# Patient Record
Sex: Female | Born: 1945 | Race: Black or African American | Hispanic: No | Marital: Single | Smoking: Never smoker
Health system: Southern US, Community
[De-identification: ages and names within clinical notes are randomized; demographics above are authoritative.]

## PROBLEM LIST (undated history)

## (undated) DIAGNOSIS — I10 Essential (primary) hypertension: Secondary | ICD-10-CM

## (undated) DIAGNOSIS — E119 Type 2 diabetes mellitus without complications: Secondary | ICD-10-CM

## (undated) DIAGNOSIS — E872 Acidosis: Secondary | ICD-10-CM

## (undated) DIAGNOSIS — M1990 Unspecified osteoarthritis, unspecified site: Secondary | ICD-10-CM

## (undated) DIAGNOSIS — R51 Headache: Secondary | ICD-10-CM

## (undated) DIAGNOSIS — E785 Hyperlipidemia, unspecified: Secondary | ICD-10-CM

## (undated) DIAGNOSIS — K635 Polyp of colon: Secondary | ICD-10-CM

## (undated) DIAGNOSIS — K219 Gastro-esophageal reflux disease without esophagitis: Secondary | ICD-10-CM

## (undated) DIAGNOSIS — J449 Chronic obstructive pulmonary disease, unspecified: Secondary | ICD-10-CM

## (undated) DIAGNOSIS — N184 Chronic kidney disease, stage 4 (severe): Secondary | ICD-10-CM

## (undated) DIAGNOSIS — Z9981 Dependence on supplemental oxygen: Secondary | ICD-10-CM

## (undated) DIAGNOSIS — M5126 Other intervertebral disc displacement, lumbar region: Secondary | ICD-10-CM

## (undated) DIAGNOSIS — I5032 Chronic diastolic (congestive) heart failure: Secondary | ICD-10-CM

## (undated) DIAGNOSIS — D860 Sarcoidosis of lung: Secondary | ICD-10-CM

## (undated) DIAGNOSIS — F419 Anxiety disorder, unspecified: Secondary | ICD-10-CM

## (undated) DIAGNOSIS — G4733 Obstructive sleep apnea (adult) (pediatric): Secondary | ICD-10-CM

## (undated) DIAGNOSIS — M109 Gout, unspecified: Secondary | ICD-10-CM

## (undated) DIAGNOSIS — K589 Irritable bowel syndrome without diarrhea: Secondary | ICD-10-CM

## (undated) DIAGNOSIS — M48061 Spinal stenosis, lumbar region without neurogenic claudication: Secondary | ICD-10-CM

## (undated) DIAGNOSIS — K5790 Diverticulosis of intestine, part unspecified, without perforation or abscess without bleeding: Secondary | ICD-10-CM

## (undated) DIAGNOSIS — M5136 Other intervertebral disc degeneration, lumbar region: Secondary | ICD-10-CM

## (undated) DIAGNOSIS — I2510 Atherosclerotic heart disease of native coronary artery without angina pectoris: Secondary | ICD-10-CM

## (undated) DIAGNOSIS — J42 Unspecified chronic bronchitis: Secondary | ICD-10-CM

## (undated) DIAGNOSIS — M51369 Other intervertebral disc degeneration, lumbar region without mention of lumbar back pain or lower extremity pain: Secondary | ICD-10-CM

## (undated) DIAGNOSIS — I509 Heart failure, unspecified: Secondary | ICD-10-CM

## (undated) DIAGNOSIS — H269 Unspecified cataract: Secondary | ICD-10-CM

## (undated) DIAGNOSIS — J189 Pneumonia, unspecified organism: Secondary | ICD-10-CM

## (undated) HISTORY — DX: Sarcoidosis of lung: D86.0

## (undated) HISTORY — DX: Polyp of colon: K63.5

## (undated) HISTORY — DX: Obstructive sleep apnea (adult) (pediatric): G47.33

## (undated) HISTORY — DX: Anxiety disorder, unspecified: F41.9

## (undated) HISTORY — DX: Gastro-esophageal reflux disease without esophagitis: K21.9

## (undated) HISTORY — PX: JOINT REPLACEMENT: SHX530

## (undated) HISTORY — DX: Diverticulosis of intestine, part unspecified, without perforation or abscess without bleeding: K57.90

## (undated) HISTORY — PX: OTHER SURGICAL HISTORY: SHX169

## (undated) HISTORY — PX: COLONOSCOPY W/ BIOPSIES AND POLYPECTOMY: SHX1376

---
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
## 1979-04-03 HISTORY — PX: REDUCTION MAMMAPLASTY: SUR839

## 1979-04-03 HISTORY — PX: CHOLECYSTECTOMY: SHX55

## 1989-04-02 HISTORY — PX: CARDIAC CATHETERIZATION: SHX172

## 1990-08-02 HISTORY — PX: ABDOMINAL HYSTERECTOMY: SHX81

## 1990-08-02 HISTORY — PX: OTHER SURGICAL HISTORY: SHX169

## 1998-11-13 ENCOUNTER — Other Ambulatory Visit: Admission: RE | Admit: 1998-11-13 | Discharge: 1998-11-13 | Payer: Self-pay | Admitting: Gynecology

## 1999-05-25 ENCOUNTER — Encounter: Admission: RE | Admit: 1999-05-25 | Discharge: 1999-08-23 | Payer: Self-pay | Admitting: Pulmonary Disease

## 1999-06-17 ENCOUNTER — Encounter: Payer: Self-pay | Admitting: Internal Medicine

## 1999-08-26 ENCOUNTER — Encounter: Admission: RE | Admit: 1999-08-26 | Discharge: 1999-10-13 | Payer: Self-pay | Admitting: *Deleted

## 1999-10-05 ENCOUNTER — Encounter: Admission: RE | Admit: 1999-10-05 | Discharge: 2000-01-03 | Payer: Self-pay | Admitting: Pulmonary Disease

## 1999-10-29 ENCOUNTER — Encounter: Payer: Self-pay | Admitting: Occupational Medicine

## 1999-10-29 ENCOUNTER — Encounter: Admission: RE | Admit: 1999-10-29 | Discharge: 1999-10-29 | Payer: Self-pay | Admitting: Occupational Medicine

## 2000-01-07 ENCOUNTER — Other Ambulatory Visit: Admission: RE | Admit: 2000-01-07 | Discharge: 2000-01-07 | Payer: Self-pay | Admitting: Gynecology

## 2002-12-18 ENCOUNTER — Encounter: Payer: Self-pay | Admitting: Orthopedic Surgery

## 2002-12-24 ENCOUNTER — Inpatient Hospital Stay (HOSPITAL_COMMUNITY): Admission: RE | Admit: 2002-12-24 | Discharge: 2002-12-27 | Payer: Self-pay | Admitting: Orthopedic Surgery

## 2002-12-27 ENCOUNTER — Inpatient Hospital Stay (HOSPITAL_COMMUNITY)
Admission: RE | Admit: 2002-12-27 | Discharge: 2003-01-04 | Payer: Self-pay | Admitting: Physical Medicine & Rehabilitation

## 2003-01-04 ENCOUNTER — Encounter (INDEPENDENT_AMBULATORY_CARE_PROVIDER_SITE_OTHER): Payer: Self-pay | Admitting: *Deleted

## 2003-01-22 ENCOUNTER — Ambulatory Visit (HOSPITAL_COMMUNITY): Admission: RE | Admit: 2003-01-22 | Discharge: 2003-01-22 | Payer: Self-pay | Admitting: Internal Medicine

## 2003-01-23 ENCOUNTER — Emergency Department (HOSPITAL_COMMUNITY): Admission: AD | Admit: 2003-01-23 | Discharge: 2003-01-23 | Payer: Self-pay

## 2003-10-09 ENCOUNTER — Emergency Department (HOSPITAL_COMMUNITY): Admission: EM | Admit: 2003-10-09 | Discharge: 2003-10-09 | Payer: Self-pay | Admitting: Family Medicine

## 2004-05-12 ENCOUNTER — Emergency Department (HOSPITAL_COMMUNITY): Admission: EM | Admit: 2004-05-12 | Discharge: 2004-05-12 | Payer: Self-pay | Admitting: Family Medicine

## 2004-06-09 ENCOUNTER — Encounter: Admission: RE | Admit: 2004-06-09 | Discharge: 2004-07-21 | Payer: Self-pay | Admitting: Pulmonary Disease

## 2004-07-21 ENCOUNTER — Ambulatory Visit: Payer: Self-pay | Admitting: Pulmonary Disease

## 2004-09-21 ENCOUNTER — Ambulatory Visit: Payer: Self-pay | Admitting: Pulmonary Disease

## 2004-10-13 ENCOUNTER — Ambulatory Visit: Payer: Self-pay | Admitting: Pulmonary Disease

## 2005-02-15 ENCOUNTER — Ambulatory Visit: Payer: Self-pay | Admitting: Pulmonary Disease

## 2005-04-19 ENCOUNTER — Ambulatory Visit: Payer: Self-pay | Admitting: Pulmonary Disease

## 2005-05-10 ENCOUNTER — Ambulatory Visit: Payer: Self-pay | Admitting: Pulmonary Disease

## 2005-10-07 ENCOUNTER — Ambulatory Visit: Payer: Self-pay | Admitting: Internal Medicine

## 2005-10-18 ENCOUNTER — Ambulatory Visit: Payer: Self-pay | Admitting: Pulmonary Disease

## 2005-11-01 ENCOUNTER — Ambulatory Visit: Payer: Self-pay | Admitting: Pulmonary Disease

## 2005-11-16 ENCOUNTER — Encounter: Admission: RE | Admit: 2005-11-16 | Discharge: 2005-11-16 | Payer: Self-pay | Admitting: Pulmonary Disease

## 2005-12-13 ENCOUNTER — Ambulatory Visit: Payer: Self-pay | Admitting: Pulmonary Disease

## 2005-12-25 ENCOUNTER — Emergency Department (HOSPITAL_COMMUNITY): Admission: EM | Admit: 2005-12-25 | Discharge: 2005-12-25 | Payer: Self-pay | Admitting: Emergency Medicine

## 2005-12-30 ENCOUNTER — Ambulatory Visit: Payer: Self-pay | Admitting: Pulmonary Disease

## 2005-12-31 ENCOUNTER — Ambulatory Visit: Payer: Self-pay | Admitting: Cardiology

## 2006-01-04 ENCOUNTER — Inpatient Hospital Stay (HOSPITAL_BASED_OUTPATIENT_CLINIC_OR_DEPARTMENT_OTHER): Admission: RE | Admit: 2006-01-04 | Discharge: 2006-01-04 | Payer: Self-pay | Admitting: Cardiology

## 2006-01-04 ENCOUNTER — Ambulatory Visit: Payer: Self-pay | Admitting: Cardiology

## 2006-01-04 DIAGNOSIS — I2510 Atherosclerotic heart disease of native coronary artery without angina pectoris: Secondary | ICD-10-CM

## 2006-01-04 HISTORY — DX: Atherosclerotic heart disease of native coronary artery without angina pectoris: I25.10

## 2006-01-13 ENCOUNTER — Ambulatory Visit: Payer: Self-pay | Admitting: Cardiology

## 2006-01-19 ENCOUNTER — Ambulatory Visit: Payer: Self-pay | Admitting: *Deleted

## 2006-01-21 ENCOUNTER — Encounter (INDEPENDENT_AMBULATORY_CARE_PROVIDER_SITE_OTHER): Payer: Self-pay | Admitting: *Deleted

## 2006-01-21 ENCOUNTER — Ambulatory Visit: Payer: Self-pay | Admitting: Cardiovascular Disease

## 2006-01-26 ENCOUNTER — Ambulatory Visit: Payer: Self-pay

## 2006-01-26 ENCOUNTER — Encounter: Payer: Self-pay | Admitting: Internal Medicine

## 2006-02-04 ENCOUNTER — Encounter (INDEPENDENT_AMBULATORY_CARE_PROVIDER_SITE_OTHER): Payer: Self-pay | Admitting: *Deleted

## 2006-02-04 ENCOUNTER — Encounter: Admission: RE | Admit: 2006-02-04 | Discharge: 2006-02-04 | Payer: Self-pay | Admitting: Cardiology

## 2006-02-16 ENCOUNTER — Ambulatory Visit: Payer: Self-pay | Admitting: Pulmonary Disease

## 2006-02-22 ENCOUNTER — Ambulatory Visit: Payer: Self-pay | Admitting: Cardiology

## 2006-02-25 ENCOUNTER — Ambulatory Visit: Payer: Self-pay | Admitting: Pulmonary Disease

## 2006-02-28 ENCOUNTER — Encounter: Admission: RE | Admit: 2006-02-28 | Discharge: 2006-02-28 | Payer: Self-pay | Admitting: Pulmonary Disease

## 2006-03-31 ENCOUNTER — Encounter: Admission: RE | Admit: 2006-03-31 | Discharge: 2006-03-31 | Payer: Self-pay | Admitting: Pulmonary Disease

## 2006-04-12 ENCOUNTER — Ambulatory Visit: Payer: Self-pay | Admitting: Pulmonary Disease

## 2006-04-12 ENCOUNTER — Inpatient Hospital Stay (HOSPITAL_COMMUNITY): Admission: EM | Admit: 2006-04-12 | Discharge: 2006-04-15 | Payer: Self-pay | Admitting: Emergency Medicine

## 2006-04-13 ENCOUNTER — Encounter: Payer: Self-pay | Admitting: Cardiovascular Disease

## 2006-04-13 ENCOUNTER — Ambulatory Visit: Payer: Self-pay | Admitting: Cardiovascular Disease

## 2006-04-20 ENCOUNTER — Ambulatory Visit: Payer: Self-pay | Admitting: Pulmonary Disease

## 2006-05-10 ENCOUNTER — Encounter: Admission: RE | Admit: 2006-05-10 | Discharge: 2006-08-08 | Payer: Self-pay | Admitting: Pulmonary Disease

## 2006-05-18 ENCOUNTER — Ambulatory Visit: Payer: Self-pay | Admitting: Pulmonary Disease

## 2006-06-10 ENCOUNTER — Ambulatory Visit (HOSPITAL_BASED_OUTPATIENT_CLINIC_OR_DEPARTMENT_OTHER): Admission: RE | Admit: 2006-06-10 | Discharge: 2006-06-10 | Payer: Self-pay | Admitting: Pulmonary Disease

## 2006-06-25 ENCOUNTER — Ambulatory Visit: Payer: Self-pay | Admitting: Pulmonary Disease

## 2006-07-05 ENCOUNTER — Ambulatory Visit: Payer: Self-pay | Admitting: Pulmonary Disease

## 2006-07-20 ENCOUNTER — Ambulatory Visit: Payer: Self-pay | Admitting: Pulmonary Disease

## 2006-09-02 ENCOUNTER — Ambulatory Visit (HOSPITAL_BASED_OUTPATIENT_CLINIC_OR_DEPARTMENT_OTHER): Admission: RE | Admit: 2006-09-02 | Discharge: 2006-09-02 | Payer: Self-pay | Admitting: Pulmonary Disease

## 2006-09-02 ENCOUNTER — Ambulatory Visit: Payer: Self-pay | Admitting: Pulmonary Disease

## 2006-10-03 ENCOUNTER — Ambulatory Visit: Payer: Self-pay | Admitting: Pulmonary Disease

## 2006-10-03 LAB — CONVERTED CEMR LAB
Albumin: 3.8 g/dL (ref 3.5–5.2)
BUN: 32 mg/dL — ABNORMAL HIGH (ref 6–23)
CO2: 33 meq/L — ABNORMAL HIGH (ref 19–32)
Calcium: 9.6 mg/dL (ref 8.4–10.5)
Chloride: 103 meq/L (ref 96–112)
Creatinine, Ser: 1.3 mg/dL — ABNORMAL HIGH (ref 0.4–1.2)
GFR calc Af Amer: 54 mL/min
GFR calc non Af Amer: 45 mL/min
Glucose, Bld: 200 mg/dL — ABNORMAL HIGH (ref 70–99)
Hgb A1c MFr Bld: 8.7 % — ABNORMAL HIGH (ref 4.6–6.0)
Phosphorus: 3.7 mg/dL (ref 2.3–4.6)
Potassium: 4.5 meq/L (ref 3.5–5.1)
Sodium: 142 meq/L (ref 135–145)

## 2006-10-17 ENCOUNTER — Ambulatory Visit: Payer: Self-pay | Admitting: Pulmonary Disease

## 2006-11-21 ENCOUNTER — Ambulatory Visit: Payer: Self-pay | Admitting: Pulmonary Disease

## 2006-11-21 LAB — CONVERTED CEMR LAB
ALT: 18 units/L (ref 0–40)
AST: 17 units/L (ref 0–37)
Albumin: 4 g/dL (ref 3.5–5.2)
Alkaline Phosphatase: 90 units/L (ref 39–117)
BUN: 24 mg/dL — ABNORMAL HIGH (ref 6–23)
Basophils Absolute: 0 10*3/uL (ref 0.0–0.1)
Basophils Relative: 0.4 % (ref 0.0–1.0)
Bilirubin, Direct: 0.1 mg/dL (ref 0.0–0.3)
CO2: 26 meq/L (ref 19–32)
Calcium: 9.6 mg/dL (ref 8.4–10.5)
Chloride: 111 meq/L (ref 96–112)
Creatinine, Ser: 1 mg/dL (ref 0.4–1.2)
Creatinine,U: 86.9 mg/dL
Eosinophils Absolute: 0.2 10*3/uL (ref 0.0–0.6)
Eosinophils Relative: 4 % (ref 0.0–5.0)
GFR calc Af Amer: 73 mL/min
GFR calc non Af Amer: 61 mL/min
Glucose, Bld: 138 mg/dL — ABNORMAL HIGH (ref 70–99)
HCT: 36.7 % (ref 36.0–46.0)
Hemoglobin: 12.8 g/dL (ref 12.0–15.0)
Hgb A1c MFr Bld: 7.5 % — ABNORMAL HIGH (ref 4.6–6.0)
Iron: 85 ug/dL (ref 42–145)
Lymphocytes Relative: 32 % (ref 12.0–46.0)
MCHC: 34.9 g/dL (ref 30.0–36.0)
MCV: 86.9 fL (ref 78.0–100.0)
Microalb Creat Ratio: 468.4 mg/g — ABNORMAL HIGH (ref 0.0–30.0)
Microalb, Ur: 40.7 mg/dL (ref 0.0–1.9)
Monocytes Absolute: 0.4 10*3/uL (ref 0.2–0.7)
Monocytes Relative: 8.8 % (ref 3.0–11.0)
Neutro Abs: 2.7 10*3/uL (ref 1.4–7.7)
Neutrophils Relative %: 54.8 % (ref 43.0–77.0)
Platelets: 212 10*3/uL (ref 150–400)
Potassium: 4.4 meq/L (ref 3.5–5.1)
RBC: 4.23 M/uL (ref 3.87–5.11)
RDW: 12.6 % (ref 11.5–14.6)
Saturation Ratios: 26.5 % (ref 20.0–50.0)
Sodium: 145 meq/L (ref 135–145)
TSH: 3.31 microintl units/mL (ref 0.35–5.50)
Total Bilirubin: 0.7 mg/dL (ref 0.3–1.2)
Total Protein: 6.8 g/dL (ref 6.0–8.3)
Transferrin: 229.2 mg/dL (ref >=212.0)
WBC: 4.9 10*3/uL (ref 4.5–10.5)

## 2007-03-05 ENCOUNTER — Emergency Department (HOSPITAL_COMMUNITY): Admission: EM | Admit: 2007-03-05 | Discharge: 2007-03-05 | Payer: Self-pay | Admitting: Emergency Medicine

## 2007-05-31 DIAGNOSIS — K219 Gastro-esophageal reflux disease without esophagitis: Secondary | ICD-10-CM | POA: Insufficient documentation

## 2007-05-31 DIAGNOSIS — L409 Psoriasis, unspecified: Secondary | ICD-10-CM

## 2007-05-31 DIAGNOSIS — I10 Essential (primary) hypertension: Secondary | ICD-10-CM

## 2007-05-31 DIAGNOSIS — M1990 Unspecified osteoarthritis, unspecified site: Secondary | ICD-10-CM | POA: Insufficient documentation

## 2007-05-31 DIAGNOSIS — D649 Anemia, unspecified: Secondary | ICD-10-CM | POA: Insufficient documentation

## 2007-06-02 ENCOUNTER — Emergency Department (HOSPITAL_COMMUNITY): Admission: EM | Admit: 2007-06-02 | Discharge: 2007-06-02 | Payer: Self-pay | Admitting: Emergency Medicine

## 2007-08-14 ENCOUNTER — Ambulatory Visit: Payer: Self-pay | Admitting: Pulmonary Disease

## 2007-08-14 DIAGNOSIS — I679 Cerebrovascular disease, unspecified: Secondary | ICD-10-CM

## 2007-08-14 DIAGNOSIS — J209 Acute bronchitis, unspecified: Secondary | ICD-10-CM

## 2007-08-14 DIAGNOSIS — K5730 Diverticulosis of large intestine without perforation or abscess without bleeding: Secondary | ICD-10-CM | POA: Insufficient documentation

## 2007-08-14 DIAGNOSIS — G4733 Obstructive sleep apnea (adult) (pediatric): Secondary | ICD-10-CM

## 2007-08-14 DIAGNOSIS — I2510 Atherosclerotic heart disease of native coronary artery without angina pectoris: Secondary | ICD-10-CM | POA: Insufficient documentation

## 2007-08-15 LAB — CONVERTED CEMR LAB
BUN: 24 mg/dL — ABNORMAL HIGH (ref 6–23)
CO2: 33 meq/L — ABNORMAL HIGH (ref 19–32)
Calcium: 9.2 mg/dL (ref 8.4–10.5)
Chloride: 101 meq/L (ref 96–112)
Creatinine, Ser: 1.7 mg/dL — ABNORMAL HIGH (ref 0.4–1.2)
GFR calc Af Amer: 40 mL/min
GFR calc non Af Amer: 33 mL/min
Glucose, Bld: 289 mg/dL — ABNORMAL HIGH (ref 70–99)
Hgb A1c MFr Bld: 7.3 % — ABNORMAL HIGH (ref 4.6–6.0)
Potassium: 4.3 meq/L (ref 3.5–5.1)
Sodium: 141 meq/L (ref 135–145)

## 2007-09-01 ENCOUNTER — Inpatient Hospital Stay (HOSPITAL_COMMUNITY): Admission: RE | Admit: 2007-09-01 | Discharge: 2007-09-07 | Payer: Self-pay | Admitting: Orthopedic Surgery

## 2007-09-13 ENCOUNTER — Ambulatory Visit: Payer: Self-pay | Admitting: Pulmonary Disease

## 2007-09-13 DIAGNOSIS — M109 Gout, unspecified: Secondary | ICD-10-CM

## 2007-10-18 ENCOUNTER — Ambulatory Visit: Payer: Self-pay | Admitting: Pulmonary Disease

## 2007-10-31 ENCOUNTER — Telehealth (INDEPENDENT_AMBULATORY_CARE_PROVIDER_SITE_OTHER): Payer: Self-pay | Admitting: *Deleted

## 2007-11-20 ENCOUNTER — Ambulatory Visit: Payer: Self-pay | Admitting: Pulmonary Disease

## 2007-11-24 LAB — CONVERTED CEMR LAB
ALT: 26 units/L (ref 0–35)
AST: 22 units/L (ref 0–37)
Albumin: 3.8 g/dL (ref 3.5–5.2)
Alkaline Phosphatase: 116 units/L (ref 39–117)
BUN: 24 mg/dL — ABNORMAL HIGH (ref 6–23)
Basophils Absolute: 0 10*3/uL (ref 0.0–0.1)
Basophils Relative: 0 % (ref 0.0–1.0)
Bilirubin, Direct: 0.1 mg/dL (ref 0.0–0.3)
CO2: 27 meq/L (ref 19–32)
Calcium: 9.7 mg/dL (ref 8.4–10.5)
Chloride: 109 meq/L (ref 96–112)
Cholesterol: 154 mg/dL (ref 0–200)
Creatinine, Ser: 1.1 mg/dL (ref 0.4–1.2)
Eosinophils Absolute: 0.3 10*3/uL (ref 0.0–0.7)
Eosinophils Relative: 4.9 % (ref 0.0–5.0)
GFR calc Af Amer: 65 mL/min
GFR calc non Af Amer: 54 mL/min
Glucose, Bld: 148 mg/dL — ABNORMAL HIGH (ref 70–99)
HCT: 37.3 % (ref 36.0–46.0)
HDL: 34 mg/dL — ABNORMAL LOW (ref >39.0)
Hemoglobin: 13 g/dL (ref 12.0–15.0)
Hgb A1c MFr Bld: 7.3 % — ABNORMAL HIGH (ref 4.6–6.0)
LDL Cholesterol: 82 mg/dL (ref 0–99)
Lymphocytes Relative: 33.5 % (ref 12.0–46.0)
MCHC: 35 g/dL (ref 30.0–36.0)
MCV: 86.5 fL (ref 78.0–100.0)
Monocytes Absolute: 0.4 10*3/uL (ref 0.1–1.0)
Monocytes Relative: 7.1 % (ref 3.0–12.0)
Neutro Abs: 3.2 10*3/uL (ref 1.4–7.7)
Neutrophils Relative %: 54.5 % (ref 43.0–77.0)
Platelets: 223 10*3/uL (ref 150–400)
Potassium: 4.3 meq/L (ref 3.5–5.1)
RBC: 4.31 M/uL (ref 3.87–5.11)
RDW: 13.1 % (ref 11.5–14.6)
Sodium: 141 meq/L (ref 135–145)
TSH: 2.07 microintl units/mL (ref 0.35–5.50)
Total Bilirubin: 0.9 mg/dL (ref 0.3–1.2)
Total CHOL/HDL Ratio: 4.5
Total Protein: 6.9 g/dL (ref 6.0–8.3)
Triglycerides: 192 mg/dL — ABNORMAL HIGH (ref 0–149)
Uric Acid, Serum: 6.8 mg/dL (ref 2.4–7.0)
VLDL: 38 mg/dL (ref 0–40)
WBC: 5.8 10*3/uL (ref 4.5–10.5)

## 2008-01-29 ENCOUNTER — Ambulatory Visit: Payer: Self-pay | Admitting: Pulmonary Disease

## 2008-01-29 DIAGNOSIS — E669 Obesity, unspecified: Secondary | ICD-10-CM

## 2008-02-19 ENCOUNTER — Encounter: Payer: Self-pay | Admitting: Pulmonary Disease

## 2008-02-21 ENCOUNTER — Encounter: Payer: Self-pay | Admitting: Pulmonary Disease

## 2008-06-17 ENCOUNTER — Emergency Department (HOSPITAL_COMMUNITY): Admission: EM | Admit: 2008-06-17 | Discharge: 2008-06-17 | Payer: Self-pay | Admitting: Family Medicine

## 2008-08-25 ENCOUNTER — Emergency Department (HOSPITAL_COMMUNITY): Admission: EM | Admit: 2008-08-25 | Discharge: 2008-08-25 | Payer: Self-pay | Admitting: Family Medicine

## 2008-09-17 ENCOUNTER — Ambulatory Visit: Payer: Self-pay | Admitting: Pulmonary Disease

## 2008-09-22 DIAGNOSIS — F411 Generalized anxiety disorder: Secondary | ICD-10-CM | POA: Insufficient documentation

## 2008-09-22 LAB — CONVERTED CEMR LAB
ALT: 25 units/L (ref 0–35)
AST: 19 units/L (ref 0–37)
Albumin: 3.4 g/dL — ABNORMAL LOW (ref 3.5–5.2)
Alkaline Phosphatase: 194 units/L — ABNORMAL HIGH (ref 39–117)
BUN: 23 mg/dL (ref 6–23)
Basophils Absolute: 0 10*3/uL (ref 0.0–0.1)
Basophils Relative: 0.6 % (ref 0.0–3.0)
Bilirubin, Direct: 0.2 mg/dL (ref 0.0–0.3)
CO2: 25 meq/L (ref 19–32)
Calcium: 9.5 mg/dL (ref 8.4–10.5)
Chloride: 107 meq/L (ref 96–112)
Cholesterol: 159 mg/dL (ref 0–200)
Creatinine, Ser: 1.5 mg/dL — ABNORMAL HIGH (ref 0.4–1.2)
Direct LDL: 82.9 mg/dL
Eosinophils Absolute: 0.2 10*3/uL (ref 0.0–0.7)
Eosinophils Relative: 4.4 % (ref 0.0–5.0)
GFR calc Af Amer: 46 mL/min
GFR calc non Af Amer: 38 mL/min
Glucose, Bld: 164 mg/dL — ABNORMAL HIGH (ref 70–99)
HCT: 35.7 % — ABNORMAL LOW (ref 36.0–46.0)
HDL: 29.2 mg/dL — ABNORMAL LOW (ref >39.0)
Hemoglobin: 12.5 g/dL (ref 12.0–15.0)
Hgb A1c MFr Bld: 8.6 % — ABNORMAL HIGH (ref 4.6–6.0)
Lymphocytes Relative: 23 % (ref 12.0–46.0)
MCHC: 35.1 g/dL (ref 30.0–36.0)
MCV: 84.9 fL (ref 78.0–100.0)
Monocytes Absolute: 0.5 10*3/uL (ref 0.1–1.0)
Monocytes Relative: 10.9 % (ref 3.0–12.0)
Neutro Abs: 2.8 10*3/uL (ref 1.4–7.7)
Neutrophils Relative %: 61.1 % (ref 43.0–77.0)
Platelets: 246 10*3/uL (ref 150–400)
Potassium: 4.5 meq/L (ref 3.5–5.1)
RBC: 4.21 M/uL (ref 3.87–5.11)
RDW: 12.7 % (ref 11.5–14.6)
Sodium: 141 meq/L (ref 135–145)
TSH: 2.25 microintl units/mL (ref 0.35–5.50)
Total Bilirubin: 0.9 mg/dL (ref 0.3–1.2)
Total CHOL/HDL Ratio: 5.4
Total Protein: 6.6 g/dL (ref 6.0–8.3)
Triglycerides: 207 mg/dL (ref 0–149)
Uric Acid, Serum: 7.5 mg/dL — ABNORMAL HIGH (ref 2.4–7.0)
VLDL: 41 mg/dL — ABNORMAL HIGH (ref 0–40)
Vit D, 25-Hydroxy: 5 ng/mL — ABNORMAL LOW (ref 30–89)
WBC: 4.5 10*3/uL (ref 4.5–10.5)

## 2008-10-02 ENCOUNTER — Telehealth (INDEPENDENT_AMBULATORY_CARE_PROVIDER_SITE_OTHER): Payer: Self-pay | Admitting: *Deleted

## 2008-10-28 ENCOUNTER — Telehealth: Payer: Self-pay | Admitting: Pulmonary Disease

## 2008-10-30 ENCOUNTER — Ambulatory Visit: Payer: Self-pay | Admitting: Pulmonary Disease

## 2008-12-09 ENCOUNTER — Telehealth (INDEPENDENT_AMBULATORY_CARE_PROVIDER_SITE_OTHER): Payer: Self-pay | Admitting: *Deleted

## 2009-05-26 ENCOUNTER — Encounter: Payer: Self-pay | Admitting: Pulmonary Disease

## 2009-08-02 DIAGNOSIS — K635 Polyp of colon: Secondary | ICD-10-CM

## 2009-08-02 HISTORY — DX: Polyp of colon: K63.5

## 2009-09-26 ENCOUNTER — Encounter (INDEPENDENT_AMBULATORY_CARE_PROVIDER_SITE_OTHER): Payer: Self-pay | Admitting: *Deleted

## 2009-09-26 ENCOUNTER — Ambulatory Visit: Payer: Self-pay | Admitting: Pulmonary Disease

## 2009-09-26 LAB — CONVERTED CEMR LAB
ALT: 14 U/L
AST: 13 U/L
Albumin: 3.8 g/dL
Alkaline Phosphatase: 102 U/L
BUN: 31 mg/dL — ABNORMAL HIGH
Basophils Absolute: 0 K/uL
Basophils Relative: 0.7 %
Bilirubin, Direct: 0.1 mg/dL
CO2: 30 meq/L
Calcium: 9.8 mg/dL
Chloride: 103 meq/L
Cholesterol: 185 mg/dL
Creatinine, Ser: 1.7 mg/dL — ABNORMAL HIGH
Direct LDL: 98.9 mg/dL
Eosinophils Absolute: 0.2 K/uL
Eosinophils Relative: 3.6 %
GFR calc non Af Amer: 39.22 mL/min
Glucose, Bld: 298 mg/dL — ABNORMAL HIGH
HCT: 38.5 %
HDL: 42.6 mg/dL
Hemoglobin: 12.8 g/dL
Hgb A1c MFr Bld: 8.4 % — ABNORMAL HIGH
Lymphocytes Relative: 20.7 %
Lymphs Abs: 0.9 K/uL
MCHC: 33.2 g/dL
MCV: 87.6 fL
Monocytes Absolute: 0.4 K/uL
Monocytes Relative: 8.4 %
Neutro Abs: 2.9 K/uL
Neutrophils Relative %: 66.6 %
Platelets: 233 K/uL
Potassium: 3.9 meq/L
RBC: 4.4 M/uL
RDW: 12.6 %
Sodium: 138 meq/L
TSH: 2.1 u[IU]/mL
Total Bilirubin: 0.8 mg/dL
Total CHOL/HDL Ratio: 4
Total Protein: 7.2 g/dL
Triglycerides: 242 mg/dL — ABNORMAL HIGH
VLDL: 48.4 mg/dL — ABNORMAL HIGH
WBC: 4.4 10*3/microliter — ABNORMAL LOW

## 2009-09-30 ENCOUNTER — Telehealth: Payer: Self-pay | Admitting: Pulmonary Disease

## 2009-10-27 LAB — CONVERTED CEMR LAB: Vit D, 25-Hydroxy: 37 ng/mL (ref 30–89)

## 2009-12-15 ENCOUNTER — Ambulatory Visit: Payer: Self-pay | Admitting: Internal Medicine

## 2009-12-15 ENCOUNTER — Encounter (INDEPENDENT_AMBULATORY_CARE_PROVIDER_SITE_OTHER): Payer: Self-pay | Admitting: *Deleted

## 2009-12-15 DIAGNOSIS — R1084 Generalized abdominal pain: Secondary | ICD-10-CM | POA: Insufficient documentation

## 2009-12-15 DIAGNOSIS — R1319 Other dysphagia: Secondary | ICD-10-CM

## 2009-12-15 DIAGNOSIS — R112 Nausea with vomiting, unspecified: Secondary | ICD-10-CM

## 2009-12-15 DIAGNOSIS — R198 Other specified symptoms and signs involving the digestive system and abdomen: Secondary | ICD-10-CM

## 2010-01-05 ENCOUNTER — Ambulatory Visit: Payer: Self-pay | Admitting: Internal Medicine

## 2010-01-07 ENCOUNTER — Encounter: Payer: Self-pay | Admitting: Internal Medicine

## 2010-03-12 ENCOUNTER — Ambulatory Visit: Payer: Self-pay | Admitting: Pulmonary Disease

## 2010-03-12 DIAGNOSIS — N259 Disorder resulting from impaired renal tubular function, unspecified: Secondary | ICD-10-CM | POA: Insufficient documentation

## 2010-03-12 DIAGNOSIS — Q2733 Arteriovenous malformation of digestive system vessel: Secondary | ICD-10-CM

## 2010-03-12 DIAGNOSIS — D126 Benign neoplasm of colon, unspecified: Secondary | ICD-10-CM

## 2010-03-12 LAB — CONVERTED CEMR LAB
BUN: 40 mg/dL — ABNORMAL HIGH (ref 6–23)
CO2: 30 meq/L (ref 19–32)
Calcium: 10.6 mg/dL — ABNORMAL HIGH (ref 8.4–10.5)
Chloride: 102 meq/L (ref 96–112)
Cholesterol: 167 mg/dL (ref 0–200)
Creatinine, Ser: 2 mg/dL — ABNORMAL HIGH (ref 0.4–1.2)
Direct LDL: 96.2 mg/dL
GFR calc non Af Amer: 32.85 mL/min (ref >60)
Glucose, Bld: 202 mg/dL — ABNORMAL HIGH (ref 70–99)
HDL: 37.2 mg/dL — ABNORMAL LOW (ref >39.00)
Hgb A1c MFr Bld: 10.9 % — ABNORMAL HIGH (ref 4.6–6.5)
Potassium: 4.7 meq/L (ref 3.5–5.1)
Sodium: 142 meq/L (ref 135–145)
Total CHOL/HDL Ratio: 4
Triglycerides: 216 mg/dL — ABNORMAL HIGH (ref 0.0–149.0)
Uric Acid, Serum: 7.7 mg/dL — ABNORMAL HIGH (ref 2.4–7.0)
VLDL: 43.2 mg/dL — ABNORMAL HIGH (ref 0.0–40.0)

## 2010-03-17 ENCOUNTER — Telehealth (INDEPENDENT_AMBULATORY_CARE_PROVIDER_SITE_OTHER): Payer: Self-pay | Admitting: *Deleted

## 2010-03-19 ENCOUNTER — Emergency Department (HOSPITAL_COMMUNITY): Admission: EM | Admit: 2010-03-19 | Discharge: 2010-03-19 | Payer: Self-pay | Admitting: Emergency Medicine

## 2010-03-23 ENCOUNTER — Telehealth: Payer: Self-pay | Admitting: Pulmonary Disease

## 2010-04-03 ENCOUNTER — Ambulatory Visit: Payer: Self-pay | Admitting: Pulmonary Disease

## 2010-04-07 ENCOUNTER — Ambulatory Visit (HOSPITAL_COMMUNITY): Admission: RE | Admit: 2010-04-07 | Discharge: 2010-04-07 | Payer: Self-pay | Admitting: Pulmonary Disease

## 2010-04-07 ENCOUNTER — Encounter: Payer: Self-pay | Admitting: Pulmonary Disease

## 2010-04-07 LAB — CONVERTED CEMR LAB
Albumin, U: DETECTED %
Alpha 1, Urine: DETECTED % — AB
Alpha 2, Urine: DETECTED % — AB
Beta, Urine: DETECTED % — AB
Free Kappa/Lambda Ratio: 19.24 — ABNORMAL HIGH (ref 0.46–4.00)
Gamma Globulin, Urine: DETECTED % — AB
Volume, Urine: 1500 mL

## 2010-04-10 ENCOUNTER — Encounter: Payer: Self-pay | Admitting: Pulmonary Disease

## 2010-04-13 ENCOUNTER — Ambulatory Visit: Payer: Self-pay

## 2010-04-13 ENCOUNTER — Encounter: Payer: Self-pay | Admitting: Pulmonary Disease

## 2010-04-14 ENCOUNTER — Ambulatory Visit: Payer: Self-pay | Admitting: Oncology

## 2010-04-22 ENCOUNTER — Encounter: Payer: Self-pay | Admitting: Pulmonary Disease

## 2010-04-22 LAB — CBC & DIFF AND RETIC
BASO%: 0.5 % (ref 0.0–2.0)
HCT: 33.8 % — ABNORMAL LOW (ref 34.8–46.6)
Immature Retic Fract: 2.8 % (ref 0.00–10.70)
MCHC: 34 g/dL (ref 31.5–36.0)
MONO#: 0.6 10*3/uL (ref 0.1–0.9)
RBC: 3.98 10*6/uL (ref 3.70–5.45)
RDW: 12.7 % (ref 11.2–14.5)
Retic %: 1.73 % — ABNORMAL HIGH (ref 0.50–1.50)
Retic Ct Abs: 68.85 10*3/uL (ref 18.30–72.70)
WBC: 4.1 10*3/uL (ref 3.9–10.3)
lymph#: 1 10*3/uL (ref 0.9–3.3)

## 2010-04-22 LAB — MORPHOLOGY
PLT EST: ADEQUATE
RBC Comments: NORMAL

## 2010-04-22 LAB — CHCC SMEAR

## 2010-04-23 LAB — CONVERTED CEMR LAB
Alpha-2-Globulin: 11.8 % (ref 7.1–11.8)
Beta Globulin: 5.8 % (ref 4.7–7.2)
IgA: 126 mg/dL (ref 68–378)
Total Protein, Serum Electrophoresis: 6.5 g/dL (ref 6.0–8.3)

## 2010-04-23 LAB — ERYTHROPOIETIN: Erythropoietin: 14.1 m[IU]/mL (ref 2.6–34.0)

## 2010-04-23 LAB — KAPPA/LAMBDA LIGHT CHAINS: Kappa:Lambda Ratio: 1.31 (ref 0.26–1.65)

## 2010-04-28 ENCOUNTER — Ambulatory Visit: Payer: Self-pay | Admitting: Pulmonary Disease

## 2010-04-28 LAB — CONVERTED CEMR LAB
BUN: 40 mg/dL — ABNORMAL HIGH (ref 6–23)
CO2: 28 meq/L (ref 19–32)
Calcium: 10.1 mg/dL (ref 8.4–10.5)
Creatinine, Ser: 2.2 mg/dL — ABNORMAL HIGH (ref 0.4–1.2)
Glucose, Bld: 149 mg/dL — ABNORMAL HIGH (ref 70–99)

## 2010-05-02 ENCOUNTER — Encounter: Admission: RE | Admit: 2010-05-02 | Discharge: 2010-05-02 | Payer: Self-pay | Admitting: Pulmonary Disease

## 2010-05-04 ENCOUNTER — Telehealth: Payer: Self-pay | Admitting: Pulmonary Disease

## 2010-05-06 ENCOUNTER — Encounter: Payer: Self-pay | Admitting: Pulmonary Disease

## 2010-05-11 ENCOUNTER — Encounter: Payer: Self-pay | Admitting: Pulmonary Disease

## 2010-06-08 ENCOUNTER — Encounter: Payer: Self-pay | Admitting: Pulmonary Disease

## 2010-06-15 ENCOUNTER — Encounter: Payer: Self-pay | Admitting: Pulmonary Disease

## 2010-06-17 ENCOUNTER — Encounter: Payer: Self-pay | Admitting: Pulmonary Disease

## 2010-06-17 ENCOUNTER — Inpatient Hospital Stay (HOSPITAL_COMMUNITY): Admission: AD | Admit: 2010-06-17 | Discharge: 2010-06-23 | Payer: Self-pay | Admitting: Internal Medicine

## 2010-06-17 ENCOUNTER — Ambulatory Visit: Payer: Self-pay | Admitting: Cardiovascular Disease

## 2010-06-17 ENCOUNTER — Encounter (INDEPENDENT_AMBULATORY_CARE_PROVIDER_SITE_OTHER): Payer: Self-pay | Admitting: *Deleted

## 2010-06-18 ENCOUNTER — Ambulatory Visit: Payer: Self-pay | Admitting: Oncology

## 2010-06-19 ENCOUNTER — Encounter (INDEPENDENT_AMBULATORY_CARE_PROVIDER_SITE_OTHER): Payer: Self-pay | Admitting: Internal Medicine

## 2010-06-22 ENCOUNTER — Encounter: Payer: Self-pay | Admitting: Pulmonary Disease

## 2010-06-22 ENCOUNTER — Encounter (INDEPENDENT_AMBULATORY_CARE_PROVIDER_SITE_OTHER): Payer: Self-pay | Admitting: Internal Medicine

## 2010-07-15 ENCOUNTER — Ambulatory Visit: Payer: Self-pay | Admitting: Pulmonary Disease

## 2010-07-15 ENCOUNTER — Telehealth: Payer: Self-pay | Admitting: Internal Medicine

## 2010-07-15 ENCOUNTER — Encounter: Payer: Self-pay | Admitting: Pulmonary Disease

## 2010-07-15 DIAGNOSIS — D7389 Other diseases of spleen: Secondary | ICD-10-CM | POA: Insufficient documentation

## 2010-07-16 DIAGNOSIS — D472 Monoclonal gammopathy: Secondary | ICD-10-CM

## 2010-07-17 ENCOUNTER — Ambulatory Visit: Payer: Self-pay | Admitting: Oncology

## 2010-07-17 ENCOUNTER — Telehealth (INDEPENDENT_AMBULATORY_CARE_PROVIDER_SITE_OTHER): Payer: Self-pay | Admitting: *Deleted

## 2010-07-17 ENCOUNTER — Encounter: Payer: Self-pay | Admitting: Pulmonary Disease

## 2010-07-17 LAB — COMPREHENSIVE METABOLIC PANEL
BUN: 33 mg/dL — ABNORMAL HIGH (ref 6–23)
CO2: 27 mEq/L (ref 19–32)
Calcium: 12.4 mg/dL — ABNORMAL HIGH (ref 8.4–10.5)
Chloride: 98 mEq/L (ref 96–112)
Creatinine, Ser: 2.87 mg/dL — ABNORMAL HIGH (ref 0.40–1.20)

## 2010-07-17 LAB — CBC WITH DIFFERENTIAL/PLATELET
Basophils Absolute: 0 10*3/uL (ref 0.0–0.1)
HCT: 31.5 % — ABNORMAL LOW (ref 34.8–46.6)
HGB: 10.5 g/dL — ABNORMAL LOW (ref 11.6–15.9)
MCH: 28.3 pg (ref 25.1–34.0)
MONO#: 0.5 10*3/uL (ref 0.1–0.9)
NEUT%: 68.7 % (ref 38.4–76.8)
WBC: 4.6 10*3/uL (ref 3.9–10.3)
lymph#: 0.8 10*3/uL — ABNORMAL LOW (ref 0.9–3.3)

## 2010-07-17 LAB — CONVERTED CEMR LAB
AST: 13 units/L (ref 0–37)
Albumin: 3.3 g/dL — ABNORMAL LOW (ref 3.5–5.2)
Alkaline Phosphatase: 84 units/L (ref 39–117)
BUN: 32 mg/dL — ABNORMAL HIGH (ref 6–23)
Basophils Absolute: 0 10*3/uL (ref 0.0–0.1)
CO2: 32 meq/L (ref 19–32)
Calcium: 12.6 mg/dL — ABNORMAL HIGH (ref 8.4–10.5)
Glucose, Bld: 368 mg/dL — ABNORMAL HIGH (ref 70–99)
Lymphocytes Relative: 16.4 % (ref 12.0–46.0)
Monocytes Relative: 12.8 % — ABNORMAL HIGH (ref 3.0–12.0)
Neutrophils Relative %: 66.1 % (ref 43.0–77.0)
Platelets: 189 10*3/uL (ref 150.0–400.0)
Potassium: 4.2 meq/L (ref 3.5–5.1)
RDW: 15.1 % — ABNORMAL HIGH (ref 11.5–14.6)
Sodium: 133 meq/L — ABNORMAL LOW (ref 135–145)
Total Protein: 6.3 g/dL (ref 6.0–8.3)
WBC: 5 10*3/uL (ref 4.5–10.5)

## 2010-07-17 LAB — LACTATE DEHYDROGENASE: LDH: 128 U/L (ref 94–250)

## 2010-07-20 ENCOUNTER — Encounter: Payer: Self-pay | Admitting: Pulmonary Disease

## 2010-07-24 ENCOUNTER — Ambulatory Visit: Payer: Self-pay | Admitting: Pulmonary Disease

## 2010-07-24 ENCOUNTER — Telehealth: Payer: Self-pay | Admitting: Pulmonary Disease

## 2010-07-29 DIAGNOSIS — K859 Acute pancreatitis without necrosis or infection, unspecified: Secondary | ICD-10-CM | POA: Insufficient documentation

## 2010-07-29 DIAGNOSIS — G4730 Sleep apnea, unspecified: Secondary | ICD-10-CM | POA: Insufficient documentation

## 2010-07-29 DIAGNOSIS — R011 Cardiac murmur, unspecified: Secondary | ICD-10-CM | POA: Insufficient documentation

## 2010-07-29 DIAGNOSIS — E7800 Pure hypercholesterolemia, unspecified: Secondary | ICD-10-CM

## 2010-07-29 DIAGNOSIS — J40 Bronchitis, not specified as acute or chronic: Secondary | ICD-10-CM | POA: Insufficient documentation

## 2010-07-29 DIAGNOSIS — K2990 Gastroduodenitis, unspecified, without bleeding: Secondary | ICD-10-CM

## 2010-07-29 DIAGNOSIS — J45909 Unspecified asthma, uncomplicated: Secondary | ICD-10-CM | POA: Insufficient documentation

## 2010-07-29 DIAGNOSIS — K2970 Gastritis, unspecified, without bleeding: Secondary | ICD-10-CM | POA: Insufficient documentation

## 2010-07-30 ENCOUNTER — Ambulatory Visit: Payer: Self-pay | Admitting: Cardiology

## 2010-07-30 ENCOUNTER — Ambulatory Visit (HOSPITAL_COMMUNITY): Admission: RE | Admit: 2010-07-30 | Payer: Self-pay | Source: Home / Self Care | Admitting: Pulmonary Disease

## 2010-07-31 ENCOUNTER — Ambulatory Visit (HOSPITAL_COMMUNITY)
Admission: RE | Admit: 2010-07-31 | Discharge: 2010-07-31 | Payer: Self-pay | Source: Home / Self Care | Attending: General Surgery | Admitting: General Surgery

## 2010-08-02 DIAGNOSIS — J189 Pneumonia, unspecified organism: Secondary | ICD-10-CM

## 2010-08-02 HISTORY — DX: Pneumonia, unspecified organism: J18.9

## 2010-08-02 HISTORY — PX: THORACENTESIS: SHX235

## 2010-08-06 ENCOUNTER — Telehealth: Payer: Self-pay | Admitting: Pulmonary Disease

## 2010-08-06 ENCOUNTER — Telehealth (INDEPENDENT_AMBULATORY_CARE_PROVIDER_SITE_OTHER): Payer: Self-pay | Admitting: *Deleted

## 2010-08-07 ENCOUNTER — Ambulatory Visit: Admit: 2010-08-07 | Payer: Self-pay | Admitting: Internal Medicine

## 2010-08-13 ENCOUNTER — Ambulatory Visit (HOSPITAL_COMMUNITY)
Admission: RE | Admit: 2010-08-13 | Discharge: 2010-08-13 | Payer: Self-pay | Source: Home / Self Care | Attending: Pulmonary Disease | Admitting: Pulmonary Disease

## 2010-08-17 LAB — GLUCOSE, CAPILLARY: Glucose-Capillary: 90 mg/dL (ref 70–99)

## 2010-08-18 ENCOUNTER — Ambulatory Visit: Admit: 2010-08-18 | Payer: Self-pay | Admitting: Pulmonary Disease

## 2010-08-19 ENCOUNTER — Telehealth: Payer: Self-pay | Admitting: Pulmonary Disease

## 2010-08-23 ENCOUNTER — Encounter: Payer: Self-pay | Admitting: Pulmonary Disease

## 2010-09-01 NOTE — Letter (Signed)
Summary: Diabetic Instructions  [facility]  [address] [place], [place] [address]   Phone: [phone]  Fax: [phone]    [name] [birth year] MRN: [id number]   X   ORAL DIABETIC MEDICATION INSTRUCTIONS  The day before your procedure:   Take your diabetic pill as you do normally  The day of your procedure:   Do not take your diabetic pill    We will check your blood sugar levels during the admission process and again in Recovery before discharging you home  ________________________________________________________________________  X   INSULIN (LONG ACTING) MEDICATION INSTRUCTIONS (Lantus, NPH, 70/30, Humulin, Novolin-N)   The day before your procedure:   Take  your regular evening dose    The day of your procedure:   Do not take your morning dose

## 2010-09-01 NOTE — Letter (Signed)
Summary: [facility] Instructions  [facility]  [address] [place], [place] [address]   Phone: [phone]  Fax: [phone]       [name]    [birth year]    MRN: [id number]        Procedure Day /Date:01/05/10  MON     Arrival Time:130 pm     Procedure Time:230 pm     Location of Procedure:                    X  [facility] (4th Floor)   PREPARATION FOR COLONOSCOPY WITH MOVIPREP   Starting 5 days prior to your procedure 12/31/09 do not eat nuts, seeds, popcorn, corn, beans, peas,  salads, or any raw vegetables.  Do not take any fiber supplements (e.g. Metamucil, Citrucel, and Benefiber).  THE DAY BEFORE YOUR PROCEDURE         DATE: 01/04/10  DAY: SUN  1.  Drink clear liquids the entire day-NO SOLID FOOD  2.  Do not drink anything colored red or purple.  Avoid juices with pulp.  No orange juice.  3.  Drink at least 64 oz. (8 glasses) of fluid/clear liquids during the day to prevent dehydration and help the prep work efficiently.  CLEAR LIQUIDS INCLUDE: Water Jello Ice Popsicles Tea (sugar ok, no milk/cream) Powdered fruit flavored drinks Coffee (sugar ok, no milk/cream) Gatorade Juice: apple, white grape, white cranberry  Lemonade Clear bullion, consomm, broth Carbonated beverages (any kind) Strained chicken noodle soup Hard Candy                             4.  In the morning, mix first dose of MoviPrep solution:    Empty 1 Pouch A and 1 Pouch B into the disposable container    Add lukewarm drinking water to the top line of the container. Mix to dissolve    Refrigerate (mixed solution should be used within 24 hrs)  5.  Begin drinking the prep at 5:00 p.m. The MoviPrep container is divided by 4 marks.   Every 15 minutes drink the solution down to the next mark (approximately 8 oz) until the full liter is complete.   6.  Follow completed prep with 16 oz of clear liquid of your choice (Nothing red or purple).  Continue to drink  clear liquids until bedtime.  7.  Before going to bed, mix second dose of MoviPrep solution:    Empty 1 Pouch A and 1 Pouch B into the disposable container    Add lukewarm drinking water to the top line of the container. Mix to dissolve    Refrigerate  THE DAY OF YOUR PROCEDURE      DATE: 01/05/10 DAY: MON  Beginning at 930 a.m. (5 hours before procedure):         1. Every 15 minutes, drink the solution down to the next mark (approx 8 oz) until the full liter is complete.  2. Follow completed prep with 16 oz. of clear liquid of your choice.    3. You may drink clear liquids until 1230 pm (2 HOURS BEFORE PROCEDURE).   MEDICATION INSTRUCTIONS  Unless otherwise instructed, you should take regular prescription medications with a small sip of water   as early as possible the morning of your procedure.  Diabetic patients - see separate instructions.           OTHER INSTRUCTIONS  You will  need a responsible adult at least 63 years of age to accompany you and drive you home.   This person must remain in the waiting room during your procedure.  Wear loose fitting clothing that is easily removed.  Leave jewelry and other valuables at home.  However, you may wish to bring a book to read or  an iPod/MP3 player to listen to music as you wait for your procedure to start.  Remove all body piercing jewelry and leave at home.  Total time from sign-in until discharge is approximately 2-3 hours.  You should go home directly after your procedure and rest.  You can resume normal activities the  day after your procedure.  The day of your procedure you should not:   Drive   Make legal decisions   Operate machinery   Drink alcohol   Return to work  You will receive specific instructions about eating, activities and medications before you leave.    The above instructions have been reviewed and explained to me by   _______________________    I fully understand and can  verbalize these instructions _____________________________ Date _________

## 2010-09-01 NOTE — Assessment & Plan Note (Signed)
Summary: rov/apc   Primary Care Provider:  Alroy Dust, MD  CC:  6 month ROV & review mult medical problems....  History of Present Illness: 65 y/o BF here for a follow up visit... he has multiple medical problems as noted below...     ~  September 17, 2008:  she is doing about the same overall-  her weight is down 9# to 193#, but she is really not exercising & her DOE hasn't improved... neither has her home BS checks despite the wt loss & an incr in her Lantus to 30-40 u daily... finally she also c/o nocturnal cough suggestive of reflux & we discussed trial of increased antireflux medication, etc...   ~  September 26, 2009:  she has poorly controlled DM- not on diet & not exercising (**we discussed diet+exercise program needed to lose wt)... on Lantus 30-40 u daily (+ below) w/ sugars "up & down" per pt (**we discussed changing Lantus to Levemir)... BP under fair control but not taking her Lasix due to legs craming (**we discussed changing Diovan to Diovan/HCT)... Lipid profiles w/ elevated TG's and she never took the Fenoglide last yr (**we discussed adding FENOBIBRATE)... she has severe orthopedic problems w/ bilat TKR's now;  moderate Psoriasis on Humera per DrTafeen;  & notes some vague abd pain w/ intermittent nausea (s/p cholecystectomy) & alt diarrhea/ constip- due for colon & we will set up GI appt w/ DrPerry.   ~  March 12, 2010:  she had GI eval 6/11 w/ EGD essent neg & Colon showing divertics, polyps, AVM> path= tubular adenoma & f/u planned 11yrs... she has lost 12# down to 174# now> but sugars are still 150-180+ at home & A1c today= 10.9 so we will incr the Levemir dose to 50u & she prob needs Humalog as well (rec- short term f/u w/ Qid BS checks the week prior)... of concern her Creat is up to 2.0 on Diovan/Hct & we will change to plain Diovan... c/o palpit & incr SOB w/ stress & we discussed adding Alprazolam 0.5mg  Prn... finally she notes IBS symptoms w/ dirrhea but INTOL to  metamucil, therefore try Questran Light 1/2 packet per day...    Current Problems:   OBSTRUCTIVE SLEEP APNEA - Sleep Study 11/07 showed RDI=21 w/ desat to 62% during REM.Marland Kitchen. eval by DrSood w/ Rx for CPAP 12... compliance poor- only using it 1-2 times/wk... using FLONASE Qhs... encouraged to use CPAP more regularly & to f/u w/ DrSood regarding the mask interface problems...  ~  2/11:  we will contact [employer] to check pt's mask & see about a diff interface...  ~  8/11:  she reports new mask but still not using CPAP regularly, "I rest fairly well".  BRONCHITIS, ACUTE - no recent symptoms, & improved on ADVAIR 250Bid (using Prn only)& PROAIR as needed.  ~  CXR 1/09 was pre-op for TKR- cardomeg & ?mild vasc congestion.  ~  CXR 2/11 showed norm heart size & vascularity, clear, s/p cholecystectomy, DJD in TSpine.  HYPERTENSION - taking NORVASC 10mg /d,  DIOVAN/Hct 320-12.5 daily, & off the prev Lasix20 due to leg cramps... not checking BP's at home... BP here 142/74 and she denies HA, visual changes, CP, palipit, dizziness, syncope, change in dyspnea, etc...  CAD - on ASA 81mg /d... Hx non-obstructive CAD w/ cath 6/07 by DrStuckey showing luminal irregularities & a very tiny first marginal branch of the CIRCw/ ostial lesion... good LVF.  AORTIC STENOSIS - 2DEcho 9/07 showed mildly calcif AoV and normal  LVF & wall motion.  CEREBROVASCULAR DISEASE - MRA Br 9/07 showed mod intracranial atherosclerotic changes... she remains on ASA 81mg /d without TIA's or other neuro manifestations...   HYPERLIPIDEMIA - on LIPITOR 20mg /d + FENOFIBRATE 160mg /d...  ~  FLP 7/07 showed TChol 114, TG 85, HDL 28, LDL 69  ~  FLP 4/09 showed TChol 154, TG 192, HDL 34, LDL 82... discussed poss of adding fibrate- hold...  ~  FLP 2/10 on Lip20 showed TChol 159, TG 207, HDL 29, LDL 83... rec> add Fenoglide (she didn't).  ~  FLP 2/11 on Lip20 showed TChol 185, TG 242, HDL 43, LDL 99... add FENOFIBRATE 160mg /d...  ~  FLP 8/11 on  Lip20+Feno160 showed TChol 167, TG 216, HDL 37, LDL 96  DM - currently on LEVEMIR 40u/d,  METFORMIN 1000mg Bid, GLIMEPIRIDE 4mg /d, JANUVIA 100mg /d...   ~  labs 4/08 showed BS=138 & HgA1c=7.5.Marland Kitchen. home BS = 100 to >300... misses doses and not really on diet or exercising...  ~  1/09 became hypoglycemic in hosp on 4 meds post knee surg- meds adjusted...  ~  3/09 restarted GLIMEPIRIDE 4mg - 1/2 tab each AM, & continue Lantus & Metformin...  ~  labs 4/09 showed BS= 148, HgA1c= 7.3.Marland Kitchen. rec> back on 4 med regimen due to poor control at home.  ~  6/09 discussed titrating the Lantus up until FBS 100-120 range...  ~  labs 2/10 showed BS= 164, HgA1c= 8.6.Marland KitchenMarland Kitchen very disappointing- needs home BS monitoring, incr Lantus, more freq ROV (she didn't f/u).  ~  labs 2/11 showed BS= 298, A1c= 8.4...  discussed change Lantus to LEVEMIR 40 u daily...  ~  labs 8/11 showed BS= 202, A1c= 10.9.Marland Kitchen. rec> incr Levemir to 50, consider Humalog.  OBESITY (ICD-278.00) - weight down sl to 186# with her attempts at diet + exercise (water exerc)...  ~  weight 220-230# in the early 1990's...  ~  weight 210-220# in the early 2000's...  ~  weight 2/10 = 192#  ~  weight 2/11 = 186#  ~  weight 8/11 = 174#  GERD - on NEXIUM 40mg  before dinner & ZANTAC 300mg  Qhs... w/ increased reflux symptoms w/ noct cough etc...   ~  2/10: discussed optimal Rx w/ Nex before dinner, Zantac300 + Reglan10 at bed, elev HOB, etc.  ~  2/11: she stopped the Reglan, still using Nexium/ Zantac but w/ persist symptoms>  refer to GI for eval.  ~  6/11: Gi f/u DrPerry w/ EGD that was normal... continue Rx.  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  ~  6/11:  f/u colonoscopy by DrPerry showed divertics, 4 polyps, AVM.Marland KitchenMarland Kitchen path= tubular adenoma, f/u 65yrs.  RENAL INSUFFICIENCY (ICD-588.9)  ~  labs 4/09 showed BUN= 24, Creat=  1.1  ~  labs 2/10 showed BUN= 23, Creat= 1.5  ~  labs 2/11 showed BUN= 31, Creat= 1.7... Lasix 20 switched to Hct 12.5mg  in the Diovan.  ~  labs 8/11 showed BUN= 40, Creat= 2.0..Marland Kitchen HCT in Diovan stopped.  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 she has finished PT and trying water exerc... on LYRICA 100mg  Qhs. off prev Tramadol Rx...  ~  2010 eval by DrHiatt @ [facility] on Tramadol 50mg , Lyrica 75mg Bid...  GOUT, UNSPECIFIED (ICD-274.9) - on ALLOPURINOL 300mg /d + Indocin Prn...  ~  labs 2/10 showed Uric= 7.5.Marland KitchenMarland Kitchen rec- restart Allopurinol.  ~  labs 8/11 showed Uric= 7.7  ANXIETY (ICD-300.00) - she is under mod stress- work, mother died, hospice counselling, etc...  ~  8/11:  rec starting ALPRAZOLAM 0.5mg  Tid for palpit, SOB, anxiety...  ANEMIA-NOS  ~  labs 2/10 showed Hg= 12.5, MCV= 85  ~  labs 2/11 showed Hg= 12.8, MCV= 88  PSORIASIS - eval and Rx per DrTafeen on HUMIRA injections...   Preventive Screening-Counseling & Management  Alcohol-Tobacco     Smoking Status: never  Allergies: 1)  ! Mucinex (Guaifenesin) 2)  ! * Latex  Comments:  Nurse/Medical Assistant: The patient's medications and allergies were reviewed with the patient and were updated in the Medication and Allergy Lists.  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 Family History of Breast Cancer: No FH of Colon Cancer: Family History of Diabetes:  Family History of Heart Disease:  Family History of Kidney Disease:  Social History: Reviewed history from 12/15/2009 and no changes required. 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  Alcohol Use - no Daily Caffeine Use  3 per day Illicit Drug Use - no  Review of Systems      See HPI       The patient complains of dyspnea on exertion and difficulty walking.  The patient denies anorexia, fever, weight loss, weight gain, vision loss, decreased hearing, hoarseness, chest pain, syncope, peripheral edema, prolonged cough, headaches, hemoptysis, abdominal pain, melena, hematochezia, severe indigestion/heartburn, hematuria, incontinence, muscle weakness, suspicious skin lesions, transient blindness, depression, unusual weight change, abnormal bleeding, enlarged lymph nodes, and angioedema.    Vital Signs:  Patient profile:   65 year old female Height:      63 inches Weight:      173.38 pounds BMI:     30.82 O2 Sat:      98 % on Room air Temp:     97.1 degrees F oral Pulse rate:   75 / minute BP sitting:   142 / 74  (right arm) Cuff size:   regular  Vitals Entered By: Randell Loop CMA (March 12, 2010 10:37 AM)  O2 Sat at Rest %:  98 O2 Flow:  Room air CC: 6 month ROV & review mult medical problems... Is Patient Diabetic? Yes Pain Assessment Patient in pain? no      Comments meds updated today with pt   Physical Exam  Additional Exam:  WD, Overweight, 64 y/o BF in NAD...  GENERAL:  Alert & oriented; pleasant & cooperative... HEENT:  [place]/AT, EOM-wnl, PERRLA, EACs-clear, TMs-wnl, NOSE-clear, THROAT- clear & wnl... NECK:  Supple w/ fair ROM; no  JVD; normal carotid impulses w/o bruits; palp thyroid, w/o nodules felt; no lymphadenopathy. CHEST:  Clear to P & A; without wheezes/ rales/ or rhonchi. HEART:  Regular Rhythm; gr 1/6 SEM, without rubs/ or gallops. ABDOMEN:  Obese soft & nontender w/ panniculus; normal bowel sounds; no organomegaly or masses detected EXT: without deformities, mod arthritic changes, s/p bilat TKRs; no varicose veins/ +venous insuffic/ tr edema. NEURO:  CN's  intact; no focal neuro deficits... DERM:  Psoriasis rash per DrTafeen...    MISC. Report  Procedure date:  03/12/2010  Findings:      BMP (METABOL)   Sodium                    142 mEq/L                   135-145   Potassium                 4.7 mEq/L                   3.5-5.1   Chloride                  102 mEq/L                   96-112   Carbon Dioxide            30 mEq/L                    19-32   Glucose              [H]  202 mg/dL                   54-09   BUN                  [H]  40 mg/dL                    8-11   Creatinine           [H]  2.0 mg/dL                   9.1-4.7   Calcium              [H]  10.6 mg/dL                  8.2-95.6   GFR                       32.85 mL/min                >60  Lipid Panel (LIPID)   Cholesterol               167 mg/dL                   2-130   Triglycerides        [H]  216.0 mg/dL                 8.6-578.4   HDL                  [L]  69.62 mg/dL                 >95.28 Cholesterol LDL - Direct                             96.2 mg/dL  Uric Acid (URIC)   Uric Acid            [H]  7.7 mg/dL                   4.1-3.2  Hemoglobin A1C (A1C)   Hemoglobin A1C       [H]  10.9 %  4.6-6.5   Impression & Recommendations:  Problem # 1:  OBSTRUCTIVE SLEEP APNEA (ICD-327.23) Still not using CPAP & advised f/u drsood at her convenience...  Problem # 2:  HYPERTENSION (ICD-401.9) BP reasonably controlled>  we are stopping the HCT for now, continue these meds & monitor BP... Her updated  medication list for this problem includes:    Norvasc 10 Mg Tabs (Amlodipine besylate) .Marland Kitchen... Take 1 by mouth once daily    Diovan 320 Mg Tabs (Valsartan) .Marland Kitchen... Take 1 tab by mouth once daily...  Orders: TLB-BMP (Basic Metabolic Panel-BMET) (80048-METABOL) TLB-Lipid Panel (80061-LIPID) TLB-Uric Acid, Blood (84550-URIC) TLB-A1C / Hgb A1C (Glycohemoglobin) (83036-A1C)  Problem # 3:  CARDIAC >> Stable on ASA 81mg /d... no angina & her palpit/ SOB/ etc seems catecholamine related w/ incr stress and treated w/ alpraz trial...  Problem # 4:  HYPERLIPIDEMIA (ICD-272.4) FLP improved w/ statin & fibrate... no toxicity, continue diet efforts and weight reduction, etc... Questran Rx primarily for diarrhea complaints. Her updated medication list for this problem includes:    Lipitor 20 Mg Tabs (Atorvastatin calcium) .Marland Kitchen... Take 1 tab by mouth once daily...    Fenofibrate 160 Mg Tabs (Fenofibrate) .Marland Kitchen... Take 1 tablet by mouth once a day    Questran Light 4 Gm Pack (Cholestyramine light) .Marland Kitchen... 1/2 packet in water or apple sauce daily as directed for bowels...  Problem # 5:  DM (ICD-250.00) Very disappointed w/ control> A1c= 10.9 despite some weight loss etc... REC> incr Levemir to 50, continue others + diet & exercise... check BS Qid for the week prior to ret to eval for Humalog... Her updated medication list for this problem includes:    Aspirin 81 Mg Tbec (Aspirin) .Marland Kitchen... Take 1 by mouth once daily...    Diovan 320 Mg Tabs (Valsartan) .Marland Kitchen... Take 1 tab by mouth once daily...    Levemir Flexpen 100 Unit/ml Soln (Insulin detemir) .Marland Kitchen... Take daily as directed (currently 50 u per day)...    Metformin Hcl 1000 Mg Tabs (Metformin hcl) .Marland Kitchen... Take one tablet by mouth two times a day    Glimepiride 4 Mg Tabs (Glimepiride) .Marland Kitchen... Take 1 tab by mouth once daily...    Januvia 100 Mg Tabs (Sitagliptin phosphate) .Marland Kitchen... Take 1 tab by mouth daily...  Problem # 6:  GI >> She has GERD w/ norm EGD, and IBS/ Divertic/  polyps/ AVM.Marland Kitchen. we discussed adding QUESTRAN LIGHT 1/2 packet daily in water or apple sauce for the diarrhea, and incr water/ fluid intake...  Problem # 7:  OTHER MEDICAL ISSUES AS NOTED>>>  Complete Medication List: 1)  Fluticasone Propionate 50 Mcg/act Susp (Fluticasone propionate) .... 2 sprays in each nostril at bedtime.Marland KitchenMarland Kitchen 2)  Advair Diskus 250-50 Mcg/dose Misc (Fluticasone-salmeterol) .Marland Kitchen.. 1 puff two times a day 3)  Proair Hfa 108 (90 Base) Mcg/act Aers (Albuterol sulfate) .... 2 puffs up to 4 times daily as needed... 4)  Aspirin 81 Mg Tbec (Aspirin) .... Take 1 by mouth once daily.Marland KitchenMarland Kitchen 5)  Norvasc 10 Mg Tabs (Amlodipine besylate) .... Take 1 by mouth once daily 6)  Diovan 320 Mg Tabs (Valsartan) .... Take 1 tab by mouth once daily.Marland KitchenMarland Kitchen 7)  Lipitor 20 Mg Tabs (Atorvastatin calcium) .... Take 1 tab by mouth once daily.Marland KitchenMarland Kitchen 8)  Fenofibrate 160 Mg Tabs (Fenofibrate) .... Take 1 tablet by mouth once a day 9)  Levemir Flexpen 100 Unit/ml Soln (Insulin detemir) .... Take daily as directed (currently 50 u per day)... 10)  Metformin Hcl 1000 Mg Tabs (Metformin hcl) .... Take one  tablet by mouth two times a day 11)  Glimepiride 4 Mg Tabs (Glimepiride) .... Take 1 tab by mouth once daily... 12)  Januvia 100 Mg Tabs (Sitagliptin phosphate) .... Take 1 tab by mouth daily... 13)  Nexium 40 Mg Cpdr (Esomeprazole magnesium) .... Take 1 cap by mouth 30 min before dinner... 14)  Ranitidine Hcl 300 Mg Tabs (Ranitidine hcl) .... Take 1 tab by mouth at bedtime... 15)  Allopurinol 300 Mg Tabs (Allopurinol) .... Take 1 tab by mouth once daily... 16)  Lyrica 100 Mg Caps (Pregabalin) .... Take 1 tablet by mouth at bedtime 17)  Vitamin D 16109 Unit Caps (Ergocalciferol) .... Take one tablet by mouth once weekly 18)  Humira Pen 40 Mg/0.9ml Kit (Adalimumab) .... Take as directed by drtaffeen 19)  Questran Light 4 Gm Pack (Cholestyramine light) .... 1/2 packet in water or apple sauce daily as directed for bowels... 20)   Alprazolam 0.5 Mg Tabs (Alprazolam) .... Take 1/2 to 1 tab by mouth up to three times a day as needed for symptoms...  Patient Instructions: 1)  Today we updated your med list- see below.... 2)  We decided to add Questran Light 1/2 packet in water or apple sauce every other day or so as directed for the loose stools... + the new Alprazolam to try 1/2 to 1 tab up to three times a day as directed for the palpitatopns, shortness of breath, and anxiety.Marland KitchenMarland Kitchen 3)  Today we did your follow up FASTING blood work... please call the "phone tree" in a few days for your lab results.Marland KitchenMarland Kitchen 4)  Call for anyquestions.Marland KitchenMarland Kitchen 5)  Please schedule a follow-up appointment in 6 months. Prescriptions: DIOVAN 320 MG TABS (VALSARTAN) take 1 tab by mouth once daily...  #30 x 11   Entered and Authorized by:   Michele Mcalpine MD   Signed by:   Michele Mcalpine MD on 03/12/2010   Method used:   Print then Give to Patient   RxID:   [id number] ALPRAZOLAM 0.5 MG TABS (ALPRAZOLAM) take 1/2 to 1 tab by mouth up to three times a day as needed for symptoms...  #90 x 6   Entered and Authorized by:   Michele Mcalpine MD   Signed by:   Michele Mcalpine MD on 03/12/2010   Method used:   Print then Give to Patient   RxID:   [id number] QUESTRAN LIGHT 4 GM PACK (CHOLESTYRAMINE LIGHT) 1/2 packet in water or apple sauce daily as directed for bowels...  #30 x prn   Entered and Authorized by:   Michele Mcalpine MD   Signed by:   Michele Mcalpine MD on 03/12/2010   Method used:   Print then Give to Patient   RxID:   [id number]    Immunization History:  Influenza Immunization History:    Influenza:  historical (05/14/2009)

## 2010-09-01 NOTE — Assessment & Plan Note (Signed)
Summary: cpx/mbw   CC:  Yearly ROV & CPX....  History of Present Illness: 65 y/o BF here for a follow up visit... he has multiple medical problems as noted below...     ~  September 17, 2008:  she is doing about the same overall-  her weight is down 9# to 193#, but she is really not exercising & her DOE hasn't improved... neither has her home BS checks despite the wt loss & an incr in her Lantus to 30-40 u daily... finally she also c/o nocturnal cough suggestive of reflux & we discussed trial of increased antireflux medication, etc...   ~  September 26, 2009:  she has poorly controlled DM- not on diet & not exercising (**we discussed diet+exercise program needed to lose wt)... on Lantus 30-40 u daily (+ below) w/ sugars "up & down" per pt (**we discussed changing Lantus to Levemir)... BP under fair control but not taking her Lasix due to legs craming (**we discussed changing Diovan to Diovan/HCT)... Lipid profiles w/ elevated TG's and she never took the Fenoglide last yr (**we discussed adding FENOBIBRATE)... she has severe orthopedic problems w/ bilat TKR's now;  moderate Psoriasis on Humera per DrTafeen;  & notes some vague abd pain w/ intermittent nausea (s/p cholecystectomy) & alt diarrhea/ constip- due for colon & we will set up GI appt w/ DrPerry.    Current Problems:   OBSTRUCTIVE SLEEP APNEA - Sleep Study 11/07 showed RDI=21 w/ desat to 62% during REM.Marland Kitchen. eval by DrSood w/ Rx for CPAP 12... compliance poor- only using it 1-2 times/wk... using FLONASE Qhs... encouraged to use CPAP more regularly & to f/u w/ DrSood regarding the mask interface problems...  ~  2/11:  we will contact [employer] to check pt's mask & see about a diff interface...  BRONCHITIS, ACUTE - no recent symptoms, & improved on ADVAIR 250Bid & PROAIR as needed.  ~  CXR 1/09 was pre-op for TKR- cardomeg & ?mild vasc congestion.  ~  CXR 2/11 showed norm heart size & vascularity, clear, s/p cholecystectomy, DJD in  TSpine.  HYPERTENSION - taking NORVASC 10mg /d,  DIOVAN 320mg /d, & off the Lasix20 due to leg cramps... not checking BP's at home... BP here 148/86 and she denies HA, visual changes, CP, palipit, dizziness, syncope, change in dyspnea, etc...  CAD - on ASA 81mg /d... Hx non-obstructive CAD w/ cath 6/07 by DrStuckey showing luminal irregularities & a very tiny first marginal branch of the CIRCw/ ostial lesion... good LVF.  AORTIC STENOSIS - 2DEcho 9/07 showed mildly calcif AoV and normal LVF & wall motion.  CEREBROVASCULAR DISEASE - MRA Br 9/07 showed mod intracranial atherosclerotic changes... she remains on ASA 81mg /d without TIA's or other neuro manifestations...   HYPERLIPIDEMIA - on LIPITOR 20mg /d... she never took the Fenoglide given 2/10...  ~  FLP 7/07 showed TChol 114, TG 85, HDL 28, LDL 69  ~  FLP 4/09 showed TChol 154, TG 192, HDL 34, LDL 82... discussed poss of adding fibrate- hold...  ~  FLP 2/10 showed TChol 159, TG 207, HDL 29, LDL 83... contin Lip20, add Fenoglide (she didn't).  ~  FLP 2/11 showed TChol 185, TG 242, HDL 43, LDL 99... add FENOFIBRATE 160mg /d...  DM - currently on LANTUS 30-40u/d,  METFORMIN 1000mg Bid, GLIMEPIRIDE 4mg /d, JANUVIA 100mg /d...   ~  labs 4/08 showed BS=138 & HgA1c=7.5.Marland Kitchen. home BS = 100 to >300... misses doses and not really on diet or exercising...  ~  1/09 became hypoglycemic in hosp on 4  meds post knee surg- meds adjusted...  ~  3/09 restarted GLIMEPIRIDE 4mg - 1/2 tab each AM, & continue Lantus & Metformin...  ~  labs 4/09 showed BS= 148, HgA1c= 7.3.Marland Kitchen. rec> back on 4 med regimen due to poor control at home.  ~  6/09 discussed titrating the Lantus up until FBS 100-120 range...  ~  labs 2/10 showed BS= 164, HgA1c= 8.6.Marland KitchenMarland Kitchen very disappointing- needs home BS monitoring, incr Lantus, more freq ROV (she didn't f/u).  ~  labs 2/11 showed BS= 298, A1c= 8.4...  discussed change Lantus to Levemir 40 u daily...  OBESITY (ICD-278.00) - weight down sl to 186# with  her attempts at diet + exercise (water exerc)...  ~  weight 220-230# in the early 1990's...  ~  weight 210-220# in the early 2000's...  ~  weight 2/10 = 192#  ~  weight 2/11 = 186#  GERD - on NEXIUM 40mg /d... but increased reflux symptoms w/ noct cough etc...   ~  2/10: discussed optimal Rx w/ Nex before dinner, Zantac300 + Reglan10 at bed, elev HOB, etc.  ~  2/11: she stopped the Reglan, still using Nexium/ Zantac but w/ persist symptoms  refer to GI for eval.  DIVERTICULOSIS OF COLON - last colonoscopy 11/00 by DrPerry showed divertics & hems, otherw neg...  ~  2/11:du for f/u colonoscopy by DrPerry.  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 she has finished PT and trying water exerc...  ~  2010- eval by DrHiatt @ [facility] on Tramadol 50mg , Lyrica 75mg Bid...  GOUT, UNSPECIFIED (ICD-274.9) - on ALLOPURINOL 300mg /d + Indocin Prn...  ~  labs 2/10 showed Uric= 7.5.Marland KitchenMarland Kitchen rec- restart Allopurinol.  ANXIETY (ICD-300.00) - she is under mod stress- work, mother died, hospice counselling, etc... she refuses more meds.  ANEMIA-NOS  ~  labs 2/10 showed Hg= 12.5, MCV= 85  ~  labs 2/11 showed Hg= 12.8, MCV= 88  PSORIASIS - eval and Rx per DrTafeen on HUMIRA injections...   Allergies: 1)  ! Mucinex (Guaifenesin) 2)  ! * Latex  Comments:  Nurse/Medical Assistant: The patient's medications and allergies were reviewed with the patient and were updated in the Medication and Allergy Lists.  Past History:  Past Medical History:  OBSTRUCTIVE SLEEP APNEA (ICD-327.23) BRONCHITIS, ACUTE (ICD-466.0) HYPERTENSION (ICD-401.9) CAD (ICD-414.00) AORTIC STENOSIS (ICD-424.1) CEREBROVASCULAR DISEASE (ICD-437.9) HYPERLIPIDEMIA (ICD-272.4) DM (ICD-250.00) OBESITY (ICD-278.00) GERD (ICD-530.81) DIVERTICULOSIS OF COLON (ICD-562.10) DEGENERATIVE JOINT DISEASE (ICD-715.90) GOUT, UNSPECIFIED (ICD-274.9) ANXIETY (ICD-300.00) ANEMIA-NOS  (ICD-285.9) PSORIASIS (ICD-696.1)  Past Surgical History: Cholecystectomy Hysterectomy S/P Breast Reduction Surgery S/P right TKR 5/04 DrAlusio S/P left TKR 1/09 DrAlusio  Family History: Reviewed history and no changes required.  Social History: Reviewed history and no changes required.  Review of Systems       The patient complains of fatigue, sleep disorder, dyspnea on exertion, nausea, change in bowel habits, abdominal pain, joint pain, muscle cramps, rash, and anxiety.  The patient denies fever, chills, sweats, anorexia, weakness, malaise, weight loss, blurring, diplopia, eye irritation, eye discharge, vision loss, eye pain, photophobia, earache, ear discharge, tinnitus, decreased hearing, nasal congestion, nosebleeds, sore throat, hoarseness, chest pain, palpitations, syncope, orthopnea, PND, peripheral edema, cough, dyspnea at rest, excessive sputum, hemoptysis, wheezing, pleurisy, vomiting, diarrhea, constipation, melena, hematochezia, jaundice, gas/bloating, indigestion/heartburn, dysphagia, odynophagia, dysuria, hematuria, urinary frequency, urinary hesitancy, nocturia, incontinence, back pain, joint swelling, muscle weakness, stiffness, arthritis, sciatica, restless legs, leg pain at night, leg pain with exertion, itching, dryness, suspicious lesions,  paralysis, paresthesias, seizures, tremors, vertigo, transient blindness, frequent falls, frequent headaches, difficulty walking, depression, memory loss, confusion, cold intolerance, heat intolerance, polydipsia, polyphagia, polyuria, unusual weight change, abnormal bruising, bleeding, enlarged lymph nodes, urticaria, allergic rash, hay fever, and recurrent infections.    Vital Signs:  Patient profile:   65 year old female Height:      63 inches Weight:      185.50 pounds BMI:     32.98 O2 Sat:      96 % on Room air Temp:     97.1 degrees F oral Pulse rate:   80 / minute BP sitting:   148 / 86  (left arm) Cuff size:    regular  Vitals Entered By: Randell Loop CMA (September 26, 2009 10:29 AM)  O2 Sat at Rest %:  96 O2 Flow:  Room air CC: Yearly ROV & CPX... Is Patient Diabetic? Yes Pain Assessment Patient in pain? no      Comments meds updated today per pts list   Physical Exam  Additional Exam:  WD, Obese, 65 y/o BF in NAD...  GENERAL:  Alert & oriented; pleasant & cooperative... HEENT:  [place]/AT, EOM-wnl, PERRLA, EACs-clear, TMs-wnl, NOSE-clear, THROAT- clear & wnl... NECK:  Supple w/ fair ROM; no JVD; normal carotid impulses w/o bruits; palp thyroid, w/o nodules felt; no lymphadenopathy. CHEST:  Clear to P & A; without wheezes/ rales/ or rhonchi. HEART:  Regular Rhythm; gr 1/6 SEM, without rubs/ or gallops. ABDOMEN:  Obese soft & nontender w/ panniculus; normal bowel sounds; no organomegaly or masses detected EXT: without deformities, mod arthritic changes, s/p bilat TKRs; no varicose veins/ +venous insuffic/ tr edema. NEURO:  CN's intact; no focal neuro deficits... DERM:  Psoriasis rash per DrTafeen...    CXR  Procedure date:  09/26/2009  Findings:      CHEST - 2 VIEW Comparison: 09/04/2007   Findings: Normal heart size and vascularity.  No focal pneumonia, edema, effusion or pneumothorax.  Midline trachea.  Degenerative thoracic spine.  Previous cholecystectomy noted.   IMPRESSION: No acute chest finding.   Read By:  Sigurd Sos.,  M.D.   EKG  Procedure date:  09/26/2009  Findings:      Normal sinus rhythm with rate of:  86/min... Tracing is WNL, NAD...   MISC. Report  Procedure date:  09/26/2009  Findings:      Lipid Panel (LIPID)   Cholesterol               185 mg/dL                   5-409   Triglycerides        [H]  242.0 mg/dL                 8.1-191.4   HDL                       78.29 mg/dL                 >56.21   Cholesterol LDL - Direct                             98.9 mg/dL  BMP (METABOL)   Sodium                    138 mEq/L  135-145   Potassium                 3.9 mEq/L                   3.5-5.1   Chloride                  103 mEq/L                   96-112   Carbon Dioxide            30 mEq/L                    19-32   Glucose              [H]  298 mg/dL                   16-10   BUN                  [H]  31 mg/dL                    9-60   Creatinine           [H]  1.7 mg/dL                   4.5-4.0   Calcium                   9.8 mg/dL                   9.8-11.9   GFR                       39.22 mL/min                >60  Hepatic/Liver Function Panel (HEPATIC)   Total Bilirubin           0.8 mg/dL                   1.4-7.8   Direct Bilirubin          0.1 mg/dL                   2.9-5.6   Alkaline Phosphatase      102 U/L                     39-117   AST                       13 U/L                      0-37   ALT                       14 U/L                      0-35   Total Protein             7.2 g/dL                    2.1-3.0   Albumin                   3.8 g/dL  3.5-5.2  Comments:      CBC Platelet w/Diff (CBCD)   White Cell Count     [L]  4.4 K/uL                    4.5-10.5   Red Cell Count            4.40 Mil/uL                 3.87-5.11   Hemoglobin                12.8 g/dL                   16.1-09.6   Hematocrit                38.5 %                      36.0-46.0   MCV                       87.6 fl                     78.0-100.0   Platelet Count            233.0 K/uL                  150.0-400.0   Neutrophil %              66.6 %                      43.0-77.0   Lymphocyte %              20.7 %                      12.0-46.0   Monocyte %                8.4 %                       3.0-12.0   Eosinophils%              3.6 %                       0.0-5.0   Basophils %               0.7 %                       0.0-3.0  TSH (TSH)   FastTSH                   2.10 uIU/mL                 0.35-5.50  Hemoglobin A1C (A1C)   Hemoglobin A1C       [H]  8.4 %                        4.6-6.5   Impression & Recommendations:  Problem # 1:  PHYSICAL EXAMINATION (ICD-V70.0)  Orders: EKG w/ Interpretation (93000) TLB-Lipid Panel (80061-LIPID) TLB-BMP (Basic Metabolic Panel-BMET) (80048-METABOL) TLB-Hepatic/Liver Function Pnl (80076-HEPATIC) TLB-CBC Platelet - w/Differential (85025-CBCD) TLB-TSH (Thyroid Stimulating Hormone) (84443-TSH) TLB-A1C / Hgb A1C (Glycohemoglobin) (83036-A1C) T-Vitamin D (25-Hydroxy) (04540-98119)  Problem # 2:  OBSTRUCTIVE SLEEP APNEA (ICD-327.23) We  will set her up to talk to [facility] about trying other masks... Orders: DME Referral (DME)  Problem # 3:  BRONCHITIS, ACUTE (ICD-466.0) Stable on the Advair & Prn Proair... The following medications were removed from the medication list:    Omnicef 300 Mg Caps (Cefdinir) .Marland Kitchen... Take 2 capsules once daily x 10 days    Delsym 30 Mg/33ml Lqcr (Dextromethorphan polistirex) .Marland Kitchen... Take 2 tsp by mouth two times a day    Mucinex Dm Maximum Strength 60-1200 Mg Xr12h-tab (Dextromethorphan-guaifenesin) .Marland Kitchen... 1-2 tablets by mouth two times a day    Doxycycline Hyclate 100 Mg Tabs (Doxycycline hyclate) .Marland Kitchen... Take one tablet by mouth two times a day as directed Her updated medication list for this problem includes:    Advair Diskus 250-50 Mcg/dose Misc (Fluticasone-salmeterol) .Marland Kitchen... 1 puff two times a day    Proair Hfa 108 (90 Base) Mcg/act Aers (Albuterol sulfate) .Marland Kitchen... 2 puffs up to 4 times daily as needed...  Orders: T-2 View CXR (71020TC)  Problem # 4:  HYPERTENSION (ICD-401.9) Fair control, not checking at home, no taking the Lasix due to legs cramping, didn't call about this to check BP/ change med/ etc... REC> change Diovan 320 to Diovan/ HCT 320-12.5 daily... monitor BP at home. The following medications were removed from the medication list:    Lasix 20 Mg Tabs (Furosemide) .Marland Kitchen... Take 1 tablet by mouth every morning Her updated medication list for this problem includes:    Norvasc 10 Mg Tabs  (Amlodipine besylate) .Marland Kitchen... Take 1 by mouth once daily    Diovan Hct 320-12.5 Mg Tabs (Valsartan-hydrochlorothiazide) .Marland Kitchen... Take 1 tab by mouth once daily  Orders: T-2 View CXR (71020TC) TLB-Lipid Panel (80061-LIPID) TLB-BMP (Basic Metabolic Panel-BMET) (80048-METABOL) TLB-Hepatic/Liver Function Pnl (80076-HEPATIC) TLB-CBC Platelet - w/Differential (85025-CBCD) TLB-TSH (Thyroid Stimulating Hormone) (84443-TSH) TLB-A1C / Hgb A1C (Glycohemoglobin) (83036-A1C) T-Vitamin D (25-Hydroxy) (16109-60454)  Problem # 5:  CAD (ICD-414.00) Denies angina, too sedentary, hasn't f/u w/ Cards lately & she will call DrWall. The following medications were removed from the medication list:    Lasix 20 Mg Tabs (Furosemide) .Marland Kitchen... Take 1 tablet by mouth every morning Her updated medication list for this problem includes:    Aspirin 81 Mg Tbec (Aspirin) .Marland Kitchen... Take 1 by mouth once daily...    Norvasc 10 Mg Tabs (Amlodipine besylate) .Marland Kitchen... Take 1 by mouth once daily    Diovan Hct 320-12.5 Mg Tabs (Valsartan-hydrochlorothiazide) .Marland Kitchen... Take 1 tab by mouth once daily  Problem # 6:  CEREBROVASCULAR DISEASE (ICD-437.9) Stable on ASA Rx but needs to address mult risk factors-  BP, DM, weight, activity level etc...  Problem # 7:  HYPERLIPIDEMIA (ICD-272.4) Chol stable on the Lip20 but TG steadily rising... rec start FENOFIBRATE 160mg /d... Her updated medication list for this problem includes:    Lipitor 20 Mg Tabs (Atorvastatin calcium) .Marland Kitchen... Take 1 tab by mouth once daily...  Problem # 8:  DM (ICD-250.00) Control is poor... discussed diet + exercise, reviewed adjustmentys in Long acting insulin til the FBS is 120-150 range... decided to change the Lantus to LEVEMIR 40u daily... continue the 3 oral agents as well... ROV in 4-48mo. Her updated medication list for this problem includes:    Aspirin 81 Mg Tbec (Aspirin) .Marland Kitchen... Take 1 by mouth once daily...    Diovan Hct 320-12.5 Mg Tabs (Valsartan-hydrochlorothiazide)  .Marland Kitchen... Take 1 tab by mouth once daily    Levemir Flexpen 100 Unit/ml Soln (Insulin detemir) .Marland Kitchen... Take daily as directed (currently 40 u per day).Marland KitchenMarland Kitchen  Metformin Hcl 1000 Mg Tabs (Metformin hcl) .Marland Kitchen... Take one tablet by mouth two times a day    Glimepiride 4 Mg Tabs (Glimepiride) .Marland Kitchen... Take 1 tab by mouth once daily...    Januvia 100 Mg Tabs (Sitagliptin phosphate) .Marland Kitchen... Take 1 tab by mouth daily...  Problem # 9:  DIVERTICULOSIS OF COLON (ICD-562.10) She has several GI symptoms which are of concern to her... rec review by drPerry w/ f/u colonoscopy. Orders: Gastroenterology Referral (GI)  Problem # 10:  DEGENERATIVE JOINT DISEASE (ICD-715.90) Severe DJD w/ bilat TKRs... she likes the Indocin best- advised to take w/ food Prn... Her updated medication list for this problem includes:    Aspirin 81 Mg Tbec (Aspirin) .Marland Kitchen... Take 1 by mouth once daily...    Indomethacin 25 Mg Caps (Indomethacin) .Marland Kitchen... Take 1 cap by mouth up to 4 times daily as needed for arthritis...  Complete Medication List: 1)  Fluticasone Propionate 50 Mcg/act Susp (Fluticasone propionate) .... 2 sprays in each nostril at bedtime.Marland KitchenMarland Kitchen 2)  Advair Diskus 250-50 Mcg/dose Misc (Fluticasone-salmeterol) .Marland Kitchen.. 1 puff two times a day 3)  Proair Hfa 108 (90 Base) Mcg/act Aers (Albuterol sulfate) .... 2 puffs up to 4 times daily as needed... 4)  Aspirin 81 Mg Tbec (Aspirin) .... Take 1 by mouth once daily.Marland KitchenMarland Kitchen 5)  Norvasc 10 Mg Tabs (Amlodipine besylate) .... Take 1 by mouth once daily 6)  Diovan Hct 320-12.5 Mg Tabs (Valsartan-hydrochlorothiazide) .... Take 1 tab by mouth once daily 7)  Lipitor 20 Mg Tabs (Atorvastatin calcium) .... Take 1 tab by mouth once daily.Marland KitchenMarland Kitchen 8)  Levemir Flexpen 100 Unit/ml Soln (Insulin detemir) .... Take daily as directed (currently 40 u per day).Marland KitchenMarland Kitchen 9)  Metformin Hcl 1000 Mg Tabs (Metformin hcl) .... Take one tablet by mouth two times a day 10)  Glimepiride 4 Mg Tabs (Glimepiride) .... Take 1 tab by mouth once  daily... 11)  Januvia 100 Mg Tabs (Sitagliptin phosphate) .... Take 1 tab by mouth daily... 12)  Nexium 40 Mg Cpdr (Esomeprazole magnesium) .... Take 1 cap by mouth 30 min before dinner... 13)  Ranitidine Hcl 300 Mg Tabs (Ranitidine hcl) .... Take 1 tab by mouth at bedtime... 14)  Metamucil 30.9 % Powd (Psyllium) .Marland Kitchen.. 1 tsp as needed 15)  Allopurinol 300 Mg Tabs (Allopurinol) .... Take 1 tab by mouth once daily... 16)  Lyrica 100 Mg Caps (Pregabalin) .... Take 1 tablet by mouth at bedtime 17)  Vitamin D 16109 Unit Caps (Ergocalciferol) .... Take one tablet by mouth once weekly 18)  Indomethacin 25 Mg Caps (Indomethacin) .... Take 1 cap by mouth up to 4 times daily as needed for arthritis... 19)  Humira Pen 40 Mg/0.28ml Kit (Adalimumab) .... Take as directed by drtaffeen  Other Orders: Prescription Created Electronically [phone])  Patient Instructions: 1)  Today we updated your med list- see below.... 2)  We refilled your meds as requested & we decided to change your INSULIN to Levemir (still 40u), and we changed the plain Diovan to Diovan/Hct for your pressure... 3)  You must work on diet + exercise & shoot for a 15-20 lb weight reduction!!! 4)  Today we did your follow up CXR, and fasting blood work... please call the "phone tree" in a few days for your lab results.Marland KitchenMarland Kitchen 5)  We will arrange for you to try some new CPAP maskes w/ Christoper Allegra.Marland KitchenMarland Kitchen 6)  We will also set up an appt w/ DrPerry for GI.Marland KitchenMarland Kitchen 7)  Call for any questions... 8)  Please schedule a follow-up  appointment in 6 months. Prescriptions: INDOMETHACIN 25 MG CAPS (INDOMETHACIN) take 1 cap by mouth up to 4 times daily as needed for arthritis...  #50 x prn   Entered and Authorized by:   Michele Mcalpine MD   Signed by:   Michele Mcalpine MD on 09/26/2009   Method used:   Print then Give to Patient   RxID:   [id number] VITAMIN D 25366 UNIT  CAPS (ERGOCALCIFEROL) take one tablet by mouth once weekly  #4 x prn   Entered and Authorized by:    Michele Mcalpine MD   Signed by:   Michele Mcalpine MD on 09/26/2009   Method used:   Print then Give to Patient   RxID:   [id number] LYRICA 100 MG  CAPS (PREGABALIN) Take 1 tablet by mouth at bedtime  #30 x prn   Entered and Authorized by:   Michele Mcalpine MD   Signed by:   Michele Mcalpine MD on 09/26/2009   Method used:   Print then Give to Patient   RxID:   [id number] ALLOPURINOL 300 MG TABS (ALLOPURINOL) take 1 tab by mouth once daily...  #30 x prn   Entered and Authorized by:   Michele Mcalpine MD   Signed by:   Michele Mcalpine MD on 09/26/2009   Method used:   Print then Give to Patient   RxID:   [id number] RANITIDINE HCL 300 MG TABS (RANITIDINE HCL) take 1 tab by mouth at bedtime...  #30 x prn   Entered and Authorized by:   Michele Mcalpine MD   Signed by:   Michele Mcalpine MD on 09/26/2009   Method used:   Print then Give to Patient   RxID:   [id number] NEXIUM 40 MG  CPDR (ESOMEPRAZOLE MAGNESIUM) take 1 cap by mouth 30 min before dinner...  #30 x prn   Entered and Authorized by:   Michele Mcalpine MD   Signed by:   Michele Mcalpine MD on 09/26/2009   Method used:   Print then Give to Patient   RxID:   [id number] JANUVIA 100 MG  TABS (SITAGLIPTIN PHOSPHATE) take 1 tab by mouth daily...  #30 x prn   Entered and Authorized by:   Michele Mcalpine MD   Signed by:   Michele Mcalpine MD on 09/26/2009   Method used:   Print then Give to Patient   RxID:   [id number] GLIMEPIRIDE 4 MG  TABS (GLIMEPIRIDE) take 1 tab by mouth once daily...  #30 x prn   Entered and Authorized by:   Michele Mcalpine MD   Signed by:   Michele Mcalpine MD on 09/26/2009   Method used:   Print then Give to Patient   RxID:   [id number] METFORMIN HCL 1000 MG  TABS (METFORMIN HCL) take one tablet by mouth two times a day  #60 x prn   Entered and Authorized by:   Michele Mcalpine MD   Signed by:   Michele Mcalpine MD on 09/26/2009   Method used:   Print then Give to Patient   RxID:   [id number] LEVEMIR  FLEXPEN 100 UNIT/ML SOLN (INSULIN DETEMIR) take daily as directed (currently 40 u per day)...  #1 mo supply x prn   Entered and Authorized by:   Michele Mcalpine MD   Signed by:   Michele Mcalpine MD on 09/26/2009   Method used:   Print then Give to Patient  RxID:   [id number] LIPITOR 20 MG  TABS (ATORVASTATIN CALCIUM) take 1 tab by mouth once daily...  #30 x prn   Entered and Authorized by:   Michele Mcalpine MD   Signed by:   Michele Mcalpine MD on 09/26/2009   Method used:   Print then Give to Patient   RxID:   [id number] DIOVAN HCT 320-12.5 MG TABS (VALSARTAN-HYDROCHLOROTHIAZIDE) take 1 tab by mouth once daily  #30 x prn   Entered and Authorized by:   Michele Mcalpine MD   Signed by:   Michele Mcalpine MD on 09/26/2009   Method used:   Print then Give to Patient   RxID:   [id number] NORVASC 10 MG  TABS (AMLODIPINE BESYLATE) take 1 by mouth once daily  #30 x prn   Entered and Authorized by:   Michele Mcalpine MD   Signed by:   Michele Mcalpine MD on 09/26/2009   Method used:   Print then Give to Patient   RxID:   [id number] PROAIR HFA 108 (90 BASE) MCG/ACT  AERS (ALBUTEROL SULFATE) 2 puffs up to 4 times daily as needed...  #1 x prn   Entered and Authorized by:   Michele Mcalpine MD   Signed by:   Michele Mcalpine MD on 09/26/2009   Method used:   Print then Give to Patient   RxID:   [id number] ADVAIR DISKUS 250-50 MCG/DOSE  MISC (FLUTICASONE-SALMETEROL) 1 puff two times a day  #1 x prn   Entered and Authorized by:   Michele Mcalpine MD   Signed by:   Michele Mcalpine MD on 09/26/2009   Method used:   Print then Give to Patient   RxID:   [id number] FLUTICASONE PROPIONATE 50 MCG/ACT SUSP (FLUTICASONE PROPIONATE) 2 sprays in each nostril at bedtime...  #1 x prn   Entered and Authorized by:   Michele Mcalpine MD   Signed by:   Michele Mcalpine MD on 09/26/2009   Method used:   Print then Give to Patient   RxID:   [id number]    CardioPerfect ECG  ID: [id number] Patient:  [name] A DOB: 05-26-1946 Age: 65 Years Old Sex: Female Race: Black Physician: Lennyx Verdell Technician: Randell Loop CMA Height: 63 Weight: 185.50 Status: Unconfirmed Past Medical History:  OBSTRUCTIVE SLEEP APNEA - Sleep Study 11/07 showed RDI=21 w/ desat to 62% during REM.Marland Kitchen. eval by DrSood w/ Rx for CPAP 12... compliance poor- only using it 1-2 times/wk... using FLONASE Qhs... encouraged to use CPAP more regularly & to f/u w/ DrSood regarding the mask interface problems...  BRONCHITIS, ACUTE - no recent symptoms, & improved on ADVAIR 250Bid & PROAIR as needed.  ~  last CXR Feb09 was pre-op for TKR-   HYPERTENSION - taking NORVASC 10mg /d,  DIOVAN 320mg /d, & LASIX 20mg /d... not checking BP's at home...    CAD - on ASA 81mg /d... Hx non-obstructive CAD w/ cath 6/07 by DrStuckey showing luminal irregularities & a very tiny first marginal branch of the CIRCw/ ostial lesion... good LVF.  AORTIC STENOSIS - 2DEcho 9/07 showed mildly calcif AoV and normal LVF & wall mo Recorded: 09/26/2009 10:44 AM P/PR: 102 ms / 147 ms - Heart rate (maximum exercise) QRS: 88 QT/QTc/QTd: 390 ms / 437 ms / 45 ms - Heart rate (maximum exercise)  P/QRS/T axis: 71 deg / 64 deg / 39 deg - Heart rate (maximum exercise)  Heartrate: 87 bpm  Interpretation:  Normal sinus rhythm with  rate of:  86/min... Tracing is WNL, NAD.Marland KitchenMarland Kitchen

## 2010-09-01 NOTE — Progress Notes (Signed)
Summary: lab results  Phone Note Outgoing Call Call back at Work Phone [phone]   Summary of Call: lmomtcb for pt to review her labs results that i have. Randell Loop [facility]  March 17, 2010 3:15 PM   Follow-up for Phone Call        pt returned call to [facility], Please call her back at [phone].  Pt also stated she took her bp this morning and it was 217/84 and she was feeling woozy. Follow-up by: Eugene Gavia,  March 19, 2010 10:19 AM  Additional Follow-up for Phone Call Additional follow up Details #1::        called and spoke with pt about her lab results--bs 200 and a1c 10.9  not good despite the 12 pound weight loss--increase levemir to 50 units daily-keep the pills the same and rov in 3 months with Sn and she will need to check BS 1 week prior to ov four times daily so these can be reviewed at ov.  bun 40/creat 2.0  mild renal insufficiency---needs to stop the hctz in the diovan---new rx for diovan 320mg  one tablet daily and this has been faxed into her pharamcy.  lipid has improved cont the lip 20mg  daily and fenofibrate 160mg  daily.  pt voiced her understanding of this and stated that she is feeling dizzy this am and her BP is 217/84.  pt stated that she took her BP meds this morning already.  please advise. thanks Randell Loop CMA  March 19, 2010 10:33 AM     Additional Follow-up for Phone Call Additional follow up Details #2::    Per SN-rec refer to nephrology(renal) for HBP/DM/Renal Insuff. Creatine 2.0. Pt agreed and order placed. Aware that [facility] will call with appt.Reynaldo Minium CMA  March 19, 2010 4:02 PM

## 2010-09-01 NOTE — Progress Notes (Signed)
Summary: returned call  Phone Note Call from Patient Call back at Work Phone [phone]   Caller: Patient Call For: [name] Summary of Call: pt returned call. call pt at [phone] Initial call taken by: Tivis Ringer, CNA,  September 30, 2009 2:34 PM  Follow-up for Phone Call        called and spoke with pt and she is aware of the change of the bp meds and the insulin at the ov with SN the other day---she will start on the fenofibrate 160mg   once daily for the tryglycerides.  she will schedule a follow up with SN in 3 months for follow up on these new meds. Randell Loop CMA  September 30, 2009 3:00 PM     New/Updated Medications: FENOFIBRATE 160 MG TABS (FENOFIBRATE) Take 1 tablet by mouth once a day Prescriptions: FENOFIBRATE 160 MG TABS (FENOFIBRATE) Take 1 tablet by mouth once a day  #30 x 6   Entered by:   Randell Loop CMA   Authorized by:   Michele Mcalpine MD   Signed by:   Randell Loop CMA on 09/30/2009   Method used:   Electronically to        [facility] [address] [phone]* (retail)       [address]       [place], [place]  [address]       Ph: [phone] or [phone]       Fax: [phone]   RxID:   [id number]

## 2010-09-01 NOTE — Miscellaneous (Signed)
Summary: Orders Update  Clinical Lists Changes  Orders: Added new Test order of Renal Artery Duplex (Renal Artery Duplex) - Signed 

## 2010-09-01 NOTE — Letter (Signed)
Summary: [facility]  [facility], [facility]   Imported By: Sherian Rein 05/25/2010 14:17:38  _____________________________________________________________________  External Attachment:    Type:   Image     Comment:   External Document

## 2010-09-01 NOTE — Letter (Signed)
Summary: Patient Notice- Polyp Results  [facility]  [address] [place], [place] [address]   Phone: [phone]  Fax: [phone]        January 07, 2010 MRN: [id number]    [name] [address] [place], [place]  [address]    Dear Ms. Foell,  I am pleased to inform you that the colon polyp(s) removed during your recent colonoscopy was (were) found to be benign (no cancer detected) upon pathologic examination.  I recommend you have a repeat colonoscopy examination in 5 years to look for recurrent polyps, as having colon polyps increases your risk for having recurrent polyps or even colon cancer in the future.  Should you develop new or worsening symptoms of abdominal pain, bowel habit changes or bleeding from the rectum or bowels, please schedule an evaluation with either your primary care physician or with me.  Additional information/recommendations:  __ No further action with gastroenterology is needed at this time. Please      follow-up with your primary care physician for your other healthcare      needs.   Please call us if you are having persistent problems or have questions about your condition that have not been fully answered at this time.  Sincerely,  Hilarie Fredrickson MD  This letter has been electronically signed by your physician.  Appended Document: Patient Notice- Polyp Results letter mailed.

## 2010-09-01 NOTE — Consult Note (Signed)
Summary: [facility]  [facility]   Imported By: Lennie Odor 05/28/2010 14:41:21  _____________________________________________________________________  External Attachment:    Type:   Image     Comment:   External Document

## 2010-09-01 NOTE — Letter (Signed)
Summary: New Patient letter  [facility]  [address] [place], [place] [address]   Phone: [phone]  Fax: [phone]       09/26/2009 MRN: [id number]  [facility] [address] [place], [place]  [address]  Dear Ms. Dunkel,  Welcome to the Gastroenterology Division at [facility].    You are scheduled to see Dr.  Marina Goodell on 11-06-09 at 10am on the 3rd floor at [facility], [address]  We ask that you try to arrive at our office 15 minutes prior to your appointment time to allow for check-in.  We would like you to complete the enclosed self-administered evaluation form prior to your visit and bring it with you on the day of your appointment.  We will review it with you.  Also, please bring a complete list of all your medications or, if you prefer, bring the medication bottles and we will list them.  Please bring your insurance card so that we may make a copy of it.  If your insurance requires a referral to see a specialist, please bring your referral form from your primary care physician.  Co-payments are due at the time of your visit and may be paid by cash, check or credit card.     Your office visit will consist of a consult with your physician (includes a physical exam), any laboratory testing he/she may order, scheduling of any necessary diagnostic testing (e.g. x-ray, ultrasound, CT-scan), and scheduling of a procedure (e.g. Endoscopy, Colonoscopy) if required.  Please allow enough time on your schedule to allow for any/all of these possibilities.    If you cannot keep your appointment, please call [phone] to cancel or reschedule prior to your appointment date.  This allows [place] the opportunity to schedule an appointment for another patient in need of care.  If you do not cancel or reschedule by 5 p.m. the business day prior to your appointment date, you will be charged a $50.00 late cancellation/no-show fee.    Thank you for  choosing [facility] for your medical needs.  We appreciate the opportunity to care for you.  Please visit [place] at our website  to learn more about our practice.                     Sincerely,                                                             The Gastroenterology Division

## 2010-09-01 NOTE — Procedures (Signed)
Summary: EGD/[facility]  EGD/[facility]   Imported By: Sherian Rein 11/07/2009 07:36:13  _____________________________________________________________________  External Attachment:    Type:   Image     Comment:   External Document

## 2010-09-01 NOTE — Assessment & Plan Note (Signed)
Summary: Abdominal pain, change in bowel habits, dry heaves, GERD    History of Present Illness Visit Type: Initial Consult Primary GI MD: Yancey Flemings MD Primary Provider: Alroy Dust, MD Requesting Provider: Alroy Dust, MD Chief Complaint: GERD and abdominal pain, constipation and some fecal incontinence at times. History of Present Illness:   65 year old African American female with multiple significant medical problems including, but not limited to, hypertension, coronary artery disease, hyperlipidemia, obesity, long-standing diabetes mellitus, psoriasis, GERD, sleep apnea, and degenerative joint disease/gout. She presents today with a multitude of gastrointestinal complaints. The patient was last seen in November of 2001 she underwent colonoscopy for rectal bleeding and upper endoscopy for chronic reflux disease. Colonoscopy revealed left-sided diverticulosis but was otherwise normal. Upper endoscopy revealed mild esophagitis and a sliding hiatal hernia, but was otherwise normal. For her reflux disease she is maintained on daily Nexium as well as ranitidine at night. If she misses a dose of medication she has significant pyrosis. She reports some intermittent dysphagia to pills and solids over the past 6 months. She reports a 2 year history of nausea with dry heaves or gagging. This occurs in the morning while brushing her teeth. Next, a two-year history of intermittent spasm type pain that can occur anywhere in the abdomen last 10-15 minutes. This occurs 4-5 times every 2 weeks. Occasional radiation to the back and occasional radiation from the back. She can identify no factors that either exacerbate, relieve, or bring on her symptoms. She denies melena or hematochezia. She also reports change in bowel habits manifested by constipation. Rare episodes of urgency with loose stools and fecal incontinence. She tells me that she has lost about 60 pounds over the past year to strictly to dietary change. She  has had diabetes for about 20 years. She is on multiple medications for her diabetes. Poor diabetic control as evidenced by hemoglobin A1c of 8.4 on February 25. Review of outside laboratory from that same date show glucose of 298 and a creatinine of 1.7. Hemoglobin normal at 12.8. Normal TSH. Normal liver function tests and protein. Normal vitamin D level. Multiple medical problems are stable.   GI Review of Systems    Reports abdominal pain, acid reflux, bloating, heartburn, nausea, vomiting, and  weight loss.     Location of  Abdominal pain: generalized. Weight loss of 60 pounds over one year.   Denies chest pain, dysphagia with liquids, dysphagia with solids, loss of appetite, vomiting blood, and  weight gain.      Reports change in bowel habits, constipation, diverticulosis, and  fecal incontinence.     Denies anal fissure, black tarry stools, diarrhea, heme positive stool, hemorrhoids, irritable bowel syndrome, jaundice, light color stool, liver problems, rectal bleeding, and  rectal pain.    Current Medications (verified): 1)  Fluticasone Propionate 50 Mcg/act Susp (Fluticasone Propionate) .... 2 Sprays in Each Nostril At Bedtime.Marland KitchenMarland Kitchen 2)  Advair Diskus 250-50 Mcg/dose  Misc (Fluticasone-Salmeterol) .Marland Kitchen.. 1 Puff Two Times A Day 3)  Proair Hfa 108 (90 Base) Mcg/act  Aers (Albuterol Sulfate) .... 2 Puffs Up To 4 Times Daily As Needed... 4)  Aspirin 81 Mg  Tbec (Aspirin) .... Take 1 By Mouth Once Daily.Marland KitchenMarland Kitchen 5)  Norvasc 10 Mg  Tabs (Amlodipine Besylate) .... Take 1 By Mouth Once Daily 6)  Diovan Hct 320-12.5 Mg Tabs (Valsartan-Hydrochlorothiazide) .... Take 1 Tab By Mouth Once Daily 7)  Lipitor 20 Mg  Tabs (Atorvastatin Calcium) .... Take 1 Tab By Mouth Once Daily.Marland KitchenMarland Kitchen 8)  Levemir  Flexpen 100 Unit/ml Soln (Insulin Detemir) .... Take Daily As Directed (Currently 40 U Per Day)... 9)  Metformin Hcl 1000 Mg  Tabs (Metformin Hcl) .... Take One Tablet By Mouth Two Times A Day 10)  Glimepiride 4 Mg  Tabs  (Glimepiride) .... Take 1 Tab By Mouth Once Daily... 11)  Januvia 100 Mg  Tabs (Sitagliptin Phosphate) .... Take 1 Tab By Mouth Daily... 12)  Nexium 40 Mg  Cpdr (Esomeprazole Magnesium) .... Take 1 Cap By Mouth 30 Min Before [employer]... 13)  Ranitidine Hcl 300 Mg Tabs (Ranitidine Hcl) .... Take 1 Tab By Mouth At Bedtime... 14)  Metamucil 30.9 %  Powd (Psyllium) .Marland Kitchen.. 1 Tsp As Needed 15)  Allopurinol 300 Mg Tabs (Allopurinol) .... Take 1 Tab By Mouth Once Daily... 16)  Lyrica 100 Mg  Caps (Pregabalin) .... Take 1 Tablet By Mouth At Bedtime 17)  Vitamin D 95188 Unit  Caps (Ergocalciferol) .... Take One Tablet By Mouth Once Weekly 18)  Indomethacin 25 Mg Caps (Indomethacin) .... Take 1 Cap By Mouth Up To 4 Times Daily As Needed For Arthritis... 19)  Humira Pen 40 Mg/0.45ml Kit (Adalimumab) .... Take As Directed By Drtaffeen 20)  Fenofibrate 160 Mg Tabs (Fenofibrate) .... Take 1 Tablet By Mouth Once A Day 21)  Doxycycline Hyclate 100 Mg Tabs (Doxycycline Hyclate) .Marland Kitchen.. 1 Tablet By Mouth Two Times A Day  Allergies (verified): 1)  ! Mucinex (Guaifenesin) 2)  ! * Latex  Past History:  Past Medical History: Reviewed history from 11/05/2009 and no changes required. OBSTRUCTIVE SLEEP APNEA (ICD-327.23) BRONCHITIS, ACUTE (ICD-466.0) HYPERTENSION (ICD-401.9) CAD (ICD-414.00) AORTIC STENOSIS (ICD-424.1) CEREBROVASCULAR DISEASE (ICD-437.9) HYPERLIPIDEMIA (ICD-272.4) DM (ICD-250.00) OBESITY (ICD-278.00) GERD (ICD-530.81) DIVERTICULOSIS OF COLON (ICD-562.10) DEGENERATIVE JOINT DISEASE (ICD-715.90) GOUT, UNSPECIFIED (ICD-274.9) ANXIETY (ICD-300.00) ANEMIA-NOS (ICD-285.9) PSORIASIS (ICD-696.1) Diverticulosis Hemorrhoids  Past Surgical History: Cholecystectomy Hysterectomy S/P Breast Reduction Surgery S/P right TKR 5/04 DrAlusio S/P left TKR 1/09 DrAlusio Tonsillectomy Appendectomy  Family History: Reviewed history and no changes required. Family History of Breast Cancer: No FH of Colon  Cancer: Family History of Diabetes:  Family History of Heart Disease:  Family History of Kidney Disease:  Social History: Reviewed history and no changes required. 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  Alcohol Use - no Daily Caffeine Use  3 per day Illicit Drug Use - no Drug Use:  no  Review of Systems       The patient complains of allergy/sinus, anemia, arthritis/joint pain, back pain, change in vision, cough, fatigue, itching, muscle pains/cramps, night sweats, shortness of breath, skin rash, and swelling of feet/legs.  The patient denies anxiety-new, blood in urine, breast changes/lumps, confusion, coughing up blood, depression-new, fainting, fever, headaches-new, hearing problems, heart murmur, heart rhythm changes, nosebleeds, sleeping problems, sore throat, swollen lymph glands, thirst - excessive, urination - excessive, urination changes/pain, urine leakage, vision changes, and voice change.    Vital Signs:  Patient profile:   65 year old female Height:      63 inches Weight:      182 pounds BMI:     32.36 Pulse rate:   64 / minute Pulse rhythm:   regular BP sitting:   152 / 88  (left arm)  Vitals Entered By: Milford Cage NCMA (Dec 15, 2009 10:24 AM)  Physical Exam  General:  Well developed, obese,well nourished, no acute distress. Head:  Normocephalic and atraumatic. Eyes:  bilateral cataracts. Anicteric Nose:  No deformity, discharge,  or lesions. Mouth:  No deformity or lesions, Neck:  Supple; no masses or thyromegaly. Lungs:  Clear throughout to auscultation. Heart:  Regular rate and rhythm; no murmurs, rubs,  or bruits. Abdomen:  Soft, obese,nontender and nondistended. No masses, hepatosplenomegaly or hernias noted. Normal bowel sounds. Rectal:  deferred until colonoscopy, Msk:  Symmetrical with no gross deformities. Normal posture. Pulses:  Normal pulses noted. Extremities:  No clubbing, cyanosis, edema or deformities noted. Neurologic:   Alert and  oriented x4;  grossly normal neurologically. Skin:  hyperpigmentation below the elbows Cervical Nodes:  No significant cervical adenopathy.no supraclavicular adenopathy Psych:  Alert and cooperative. Normal mood and affect.   Impression & Recommendations:  Problem # 1:  ABDOMINAL PAIN -GENERALIZED (ICD-789.07) 2 year history of intermittent sharp spasm type pains in the abdomen. Etiology unclear. Over the same time frame, change in bowel habits with predominant constipation and subsequently weight loss, allegedly by intent.  Plan: #1. Colonoscopy to evaluate pain and change in bowel habits, as well as provide repeat neoplasia screening for which she is due. The nature of the procedure as well as the risks, benefits, and alternatives have been reviewed. She understood and agreed to proceed. The patient is higher than baseline risk due to her multiple comorbidities and the need to address her diabetic medications for the procedure. #2. Movi prep prescribed. The patient instructed on its use. #3. Hold diabetic medications the morning of the procedure until the patient has resume normal p.o. intake. This in an effort to avoid unwanted hypoglycemia. We will monitor her blood sugar immediately prior to after her procedures. #4. If endoscopic workup negative then consider imaging such as CT scan. She is status post cholecystectomy. #5. Consider antispasmodics  Problem # 2:  CHANGE IN BOWELS (ICD-787.99) as manifested principally by constipation(564.0). This may be functional, a side effect of medications, or possibly structural(particularly given the weight loss and pain).  Plan: #1. Colonoscopy #2. If colonoscopy negative then MiraLax therapy  Problem # 3:  NAUSEA AND VOMITING (ICD-787.01) really describes active gag reflex with brushing teeth. Not sure much else needs to be done based on the history.  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  Plan: #1. Upper endoscopy to evaluate dysphagia. As well, further evaluate abdominal pain.  Problem # 5:  GERD (ICD-530.81) requiring daily PPI and at night H2 receptor antagonist for control of symptoms  Plan: #1. Strict adherence to reflux precautions #2. Continue PPI and H2 receptor antagonist #3. Upper endoscopy planned  Problem # 6:  DM (ICD-250.00) adjust diabetic medications for the procedure.  Other Orders: Colon/Endo (Colon/Endo)  Patient Instructions: 1)  Copy sent to : Alroy Dust MD 2)  Colonoscopy and  Upper Endoscopy brochure given.  3)  Your prescription will be sent to your pharmacy for your colon prep. 4)  Please follow the Diabetic instructions prior to your procedure. 5)  The medication list was reviewed and reconciled.  All changed / newly prescribed medications were explained.  A complete medication list was provided to the patient / caregiver. Prescriptions: MOVIPREP 100 GM  SOLR (PEG-KCL-NACL-NASULF-NA ASC-C) As per prep instructions.  #1 x 0   Entered by:   Chales Abrahams CMA (AAMA)   Authorized by:   Hilarie Fredrickson MD   Signed by:   Hilarie Fredrickson MD on 12/15/2009   Method used:   Electronically to        [facility] [address] [phone]* (retail)       [address]  [place], [place]  [address]       Ph: [phone] or [phone]       Fax: [phone]   RxID:   [id number] MOVIPREP 100 GM  SOLR (PEG-KCL-NACL-NASULF-NA ASC-C) As per prep instructions.  #1 x 0   Entered by:   Milford Cage NCMA   Authorized by:   Hilarie Fredrickson MD   Signed by:   Milford Cage NCMA on 12/15/2009   Method used:   Electronically to        [facility] [phone]* (retail)       [address]       [place], [place]  [address]       Ph: [phone] or [phone]       Fax: [phone]   RxID:   [id number]

## 2010-09-01 NOTE — Procedures (Signed)
Summary: Upper Endoscopy  Patient: [name] Note: All result statuses are Final unless otherwise noted.  Tests: (1) Upper Endoscopy (EGD)   EGD Upper Endoscopy       DONE     [facility]     [address]     [place], [place]  [address]           ENDOSCOPY PROCEDURE REPORT           PATIENT:  [name], [name]  MR#:  [id number]     BIRTHDATE:  [birth year], 63 yrs. old  GENDER:  female           ENDOSCOPIST:  Wilhemina Bonito. Eda Keys, MD     Referred by:  Daryll Drown.           PROCEDURE DATE:  01/05/2010     PROCEDURE:  EGD, diagnostic     ASA CLASS:  Class III     INDICATIONS:  GERD, dysphagia, abdominal pain           MEDICATIONS:   There was residual sedation effect present from     prior procedure., Versed 2 mg IV     TOPICAL ANESTHETIC:  Exactacain Spray           DESCRIPTION OF PROCEDURE:   After the risks benefits and     alternatives of the procedure were thoroughly explained, informed     consent was obtained.  The LB GIF-H180 [id number] endoscope was     introduced through the mouth and advanced to the second portion of     the duodenum, without limitations.  The instrument was slowly     withdrawn as the mucosa was fully examined.     <<PROCEDUREIMAGES>>           The upper, middle, and distal third of the esophagus were     carefully inspected and no abnormalities were noted. The z-line     was well seen at the GEJ. The endoscope was pushed into the fundus     which was normal including a retroflexed view. The antrum,gastric     body, first and second part of the duodenum were unremarkable.     Retroflexed views revealed no abnormalities.    The scope was then     withdrawn from the patient and the procedure completed.           COMPLICATIONS:  None           ENDOSCOPIC IMPRESSION:     1) Normal EGD     2) Gerd     RECOMMENDATIONS:     1) Anti-reflux regimen to be follow     2) continue PPI therapy for GERD     3) GI follow up prn        ______________________________     Wilhemina Bonito. Eda Keys, MD           CC:  Michele Mcalpine, MD, The Patient           n.     eSIGNEDWilhemina Bonito. Eda Keys at 01/05/2010 03:27 PM           [name], [name], [id number]  Note: An exclamation mark (!) indicates a result that was not dispersed into the flowsheet. Document Creation Date: 01/05/2010 3:28 PM _______________________________________________________________________  (1) Order result status: Final Collection or observation date-time: 01/05/2010 15:22 Requested date-time:  Receipt date-time:  Reported date-time:  Referring Physician:   Ordering Physician: Jonny Ruiz  Eda Keys [phone]) Specimen Source:  Source: Launa Grill Order Number: [id number] Lab site:

## 2010-09-01 NOTE — Procedures (Signed)
Summary: Colonoscopy  Patient: [name] Note: All result statuses are Final unless otherwise noted.  Tests: (1) Colonoscopy (COL)   COL Colonoscopy           DONE     [facility]     [address]     [place], [place]  [address]           COLONOSCOPY PROCEDURE REPORT           PATIENT:  [name], [name]  MR#:  [id number]     BIRTHDATE:  06/30/1946, 63 yrs. old  GENDER:  female     ENDOSCOPIST:  Wilhemina Bonito. Eda Keys, MD     REF. BY:  Dr. Kriste Basque     PROCEDURE DATE:  01/05/2010     PROCEDURE:  Colonoscopy with snare polypectomy x 4     ASA CLASS:  Class III     INDICATIONS:  Routine Risk Screening, change in bowel habits,     abdominal pain     MEDICATIONS:   Fentanyl 100 mcg IV, Versed 10 mg IV           DESCRIPTION OF PROCEDURE:   After the risks benefits and     alternatives of the procedure were thoroughly explained, informed     consent was obtained.  Digital rectal exam was performed and     revealed no abnormalities.   The LB CF-[id number] endoscope     was introduced through the anus and advanced to the cecum, which     was identified by both the appendix and ileocecal valve, without     limitations. Time to cecum = 4:08 min.The quality of the prep was     excellent, using MoviPrep.  The instrument was then slowly     withdrawn (time = 16:39 min) as the colon was fully examined.     <<PROCEDUREIMAGES>>           FINDINGS:  Four polyps, all < 4mm, were found in the ascending     colon(2), trnsv. colon and rectum.. Polyps were snared without     cautery. Retrieval was successful in 3/4.  Moderate diverticulosis     was found in the sigmoid colon.  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   Retroflexed views in the     rectum revealed no abnormalities.    The scope was then withdrawn     from the patient and the procedure completed.           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 Eda Keys, MD           CC:  Michele Mcalpine, MD ; The Patient           n.     eSIGNED:   Wilhemina Bonito. Eda Keys at 01/05/2010 03:18 PM           Katherene, Dinino, [id number]  Note: An exclamation mark (!) indicates  a result that was not dispersed into the flowsheet. Document Creation Date: 01/05/2010 3:19 PM _______________________________________________________________________  (1) Order result status: Final Collection or observation date-time: 01/05/2010 15:10 Requested date-time:  Receipt date-time:  Reported date-time:  Referring Physician:   Ordering Physician: Fransico Setters [phone]) Specimen Source:  Source: Launa Grill Order Number: [id number] Lab site:   Appended Document: Colonoscopy recall in 5 yrs/01-2015     Procedures Next Due Date:    Colonoscopy: 01/2015

## 2010-09-01 NOTE — Progress Notes (Signed)
Summary: rx req/ congestion  Phone Note Call from Patient   Caller: Patient Call For: nadel Summary of Call: c/o sinus/ chest congestion x 2 wks. requests rx called in to [facility] [address] pt # [id number] Initial call taken by: Tivis Ringer, CNA,  May 04, 2010 10:53 AM  Follow-up for Phone Call        called and spoke with pt.  pt states symptoms started 10 days ago.  Pt c/o hoarseness, chest sore, increased coughing to the point of gagging, unable to cough up sputum, head congestion, "head hurts,"  teeth hurt, sinus pressure, pain behind L eye, sweats/chills, and green nasal drainage.  Pt denied body aches.  Please advise.  Aundra Millet Reynolds LPN  May 04, 2010 11:02 AM  allergies: Latex and Mucinex  Additional Follow-up for Phone Call Additional follow up Details #1::        I called pt to discuss current symptoms and the MRI> she has appt w/ DrGranfortuna 10/5 at [facility] for the Monoclonal gammopathy & abn scans w/ splenomegaly & numerous <1cm lesions ?etiology...  I called in Augmentin 875mg Bid & Phen expect w/ codeine to [facility] on [address]...  SN Additional Follow-up by: Michele Mcalpine MD,  May 04, 2010 1:39 PM

## 2010-09-01 NOTE — Progress Notes (Signed)
Summary: nephrology appt  Phone Note From Other Clinic   Caller: Larita Fife with Dr. Allena Katz Call For: Dr. Kriste Basque Summary of Call: [phone] Larita Fife with Dr. Eliane Decree office called in reference to the request for asap appt. Dr. Allena Katz has reviewed pt's records and has rated pt a level 3 of importance to be seen quickly.  Dr. Allena Katz asked that his new patient coordinator call our office and ask if the patient has D/C her Metformin? Apparently he feels like the Metformin may cause the creatine level to elevate. If Dr. Kriste Basque would like to speak with Dr. Allena Katz about Mrs. McFarland, please call [phone]. Appt has not been scheduled yet.  Initial call taken by: Alfonso Ramus,  March 23, 2010 4:13 PM  Follow-up for Phone Call        per SN----call pt please and let her know that we are  trying to get appt with nephrology---they have reveiwed her papers and they want her to stop the metformin rx.  unable to reach pt at work number---i called and they put me on hold and never came back to the phone.  will try back later [address] [facility]  March 23, 2010 5:09 PM   Additional Follow-up for Phone Call Additional follow up Details #1::        lmomtcb [address] [facility]  March 24, 2010 9:02 AM   Per Dr. Kriste Basque, call [facility] and tell Dr. Allena Katz that Metformin has been D/C. LMOAM for Larita Fife, Dr. Eliane Decree new patient coordinator. Still waiting on appt. Alfonso Ramus  March 24, 2010 10:28 AM  Melanie Crazier with [facility] to check on status of appt. Waiting on return call. Alfonso Ramus  March 30, 2010 3:07 PM      Appended Document: orders update     Clinical Lists Changes  Orders: Added new Referral order of Radiology Referral (Radiology) - Signed Added new Referral order of Radiology Referral (Radiology) - Signed

## 2010-09-01 NOTE — Discharge Summary (Signed)
Summary: Discharge Summary    NAME:  [name], [name]                      ACCOUNT NO.:  [id number]   MEDICAL RECORD NO.:  [id number]                   PATIENT TYPE:  IPS   LOCATION:  4146                                 FACILITY:  [facility]   PHYSICIAN:  Ranelle Oyster, M.D.             DATE OF BIRTH:  [birth year]   DATE OF ADMISSION:  12/27/2002  DATE OF DISCHARGE:  01/04/2003                                 DISCHARGE SUMMARY   DISCHARGE DIAGNOSES:  1. Right  total knee arthroplasty.  2. Deep venous thrombosis prophylaxis.  3. History of hypertension.  4. History of diabetes.  5. Anemia.   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 The patient underwent a right  total knee  arthroplasty Dec 25, 2002, by Dr. Lequita Halt. The patient was Coumadin for DVT  prophylaxis. The physical therapy report at this time shows the patient is  weight bearing as tolerated and is ambulating 60 feet with supervision and a  rolling walker, for transfers minimum assistance and for bed mobility  minimum assistance. Her hospital course was significant for anemia and  hyponatremia. The patient was transferred to the [facility]  Rehabilitation Department on Dec 27, 2002.   PAST MEDICAL HISTORY:  1. Hypertension.  2. Psoriasis.  3. Gastroesophageal reflux disease.  4. Non insulin diabetes mellitus.   PAST SURGICAL HISTORY:  1. Hysterectomy.  2. Cholecystectomy.  3. Breast cancer.   MEDICATIONS PRIOR TO ADMISSION:  1. Norvasc 5 mg daily.  2. Glyburide 5 mg daily.  3. Lisinopril 4 mg daily.  4. Ultracet.  5. Methotrexate 2.5 mg 4 tablets daily.  6. Avandamet.   ALLERGIES:  None.   PRIMARY CARE PHYSICIAN:  Lonzo Cloud. Kriste Basque, M.D. [facility]   SOCIAL HISTORY:  The [place] elderly [place]  prior to  admission with a cane in a 1 level  home with no steps to entry. 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 She  has good support of the local family.   REVIEW OF SYSTEMS:  Significant for negative chest pain. No nausea or  vomiting. Positive for joint  pain and joint swelling.   HOSPITAL COURSE:  [name] was admitted to [facility]  Rehabilitation Department on Dec 27, 2002, for comprehensive inpatient  rehabilitation where she received more than 3 hours of therapy daily.  Overall the patient made pretty good progress during her 8-day stay in  rehabilitation. Her main problem was pain management. The patient was  initially started on  OxyContin 10 mg p.o. q.12h. Due to significant pain  the  OxyContin was increased to 30 mg p.o. q.12h. and the patient finally  had some relief with pain management. The patient also received  Oxycodone  p.r.n. as well as Skelaxin as needed for muscle spasms.   The patient's hospital course was also significant for slightly elevated  blood pressure despite being on Norvasc, and numbness in the lower extremity  and toes, probably related to back pain and anemia. The patient remained on  Trinsicon 1 tablet  p.o. b.i.d. for anemia.  She had an admission hemoglobin  of 10.0. The patient remained on Coumadin during her entire stay for DVT  prophylaxis. No bleeding or complications were noted.   At the time of admission the patient was placed on Norvasc 5 mg  p.o. every  day for hypertension. Due to slightly elevated blood pressure, Norvasc  was  increased  from 5 to 10 mg p.o. every day and her blood pressure did  improve.   The patient occasionally did complain of joint pain. She was started on  Bextra 10 mg p.o. every day on January 02, 2003.   Besides occasional flatus there were no other major issues that occurred  while she was in rehabilitation. She remained on Glucophage as well as  Avandia for her diabetes. Her CBGs remained in  good control.   LABORATORY DATA:  Latest labs:  Hemoglobin 10.0, hematocrit 28.9, white  blood cell count 5.4, platelet count 257. Latest INR performed was 2.0 on  January 04, 2003. Latest sodium 131, potassium 4.0, chloride 101, CO2 30,  glucose 99, BUN 11, creatinine 0.9. AST 15, ALT 15, alkaline phosphatase 72,  total bilirubin 0.4. Urine culture:  Greater than 75 colonies of multiple  species present __________ isolated.   CONDITION ON DISCHARGE:  At the time of discharge her surgical incision was  well healed with 1+ edema. No signs of infection. The physical therapy  report indicates the patient is able to ambulate weight bearing as tolerated  approximately 150 feet, modified independent  with a rolling walker,  transfers and sit-to-stand modified independent. Bed mobility modified  independent. She could perform most activities of daily living modified  independently. The patient has approximately 81 degrees flexion in her right  knee. She was overall able to tolerate therapy very well. At this time her  physical examination is unremarkable. Her CBGs are running from 81 to 141  and 76 and vital signs were stable. The patient was discharged to home with  her family.   DISCHARGE MEDICATIONS:  1. Norvasc 10 mg daily.  2. Avandamet 4 mg 1 tablet b.i.d.  3. Glyburide 5 mg b.i.d.  4. Folic acid 5 mg daily.  5. Trinsicon 1 tablet b.i.d.  6. OxyContin 30 mg on a taper.  7. Oxycodone 5 to 10 mg q.4-6h. p.r.n.  8. Coumadin 1 mg until January 24, 2003.  9. Resume Adamant.  10.      Skelaxin 40 mg 1 to  2 tablets q.6h. p.r.n.  11.      Lisinopril 40 mg daily.  12.      Vioxx 12.5 mg daily.   DISCHARGE INSTRUCTIONS:  Activity, no driving, no drinking alcohol. Use  walker. No aspirin, ibuprofen or Aleve while on Coumadin. Diet no  concentrated sweets, check CBGs at least twice daily. Her home health nurse  will monitor PT and OT will monitor Coumadin. Her first draw will be on  Monday, January 07, 2003.  FOLLOW UP:  She will follow up with Dr. Lequita Halt in 2 weeks, call for an  appointment. Follow up with Dr. Kriste Basque in 6 weeks, call for an appointment.  Junie Bame, P.A.                       Ranelle Oyster, M.D.    LH/MEDQ  D:  01/04/2003  T:  01/05/2003  Job:  [id number]   cc:   Ollen Gross, M.D.  [address]  [place]  [place] [address]  Fax: [phone]   Lonzo Cloud. Kriste Basque, M.D. [facility]

## 2010-09-01 NOTE — Procedures (Signed)
Summary: [employer]   Imported By: Sherian Rein 11/07/2009 07:37:30  _____________________________________________________________________  External Attachment:    Type:   Image     Comment:   External Document

## 2010-09-02 ENCOUNTER — Telehealth (INDEPENDENT_AMBULATORY_CARE_PROVIDER_SITE_OTHER): Payer: Self-pay | Admitting: *Deleted

## 2010-09-02 ENCOUNTER — Encounter: Payer: Self-pay | Admitting: Oncology

## 2010-09-02 HISTORY — PX: OTHER SURGICAL HISTORY: SHX169

## 2010-09-03 NOTE — Progress Notes (Signed)
Summary: returning a call  Phone Note Call from Patient Call back at Home Phone [phone]   Caller: Patient Call For: Dr. Kriste Basque Summary of Call: Patient stated that she was returning a call from Wednesday wasn't sure who phoned or what it was regarding. She can be reached at work until apporximately 11:30 [phone] and on her cell  [phone] after 11:30.  Initial call taken by: Vedia Coffer,  July 24, 2010 10:48 AM  Follow-up for Phone Call        leigh did you call this pt?Carron Curie CMA  July 24, 2010 11:25 AM   i did.  lmomtcb for pt [address] [facility]  July 24, 2010 11:42 AM   Pt returned call to [facility] and advised she can be reached at [phone]. thanks. Zackery Barefoot CMA  July 24, 2010 11:56 AM   Additional Follow-up for Phone Call Additional follow up Details #1::        lmomtcb for pt [address] [facility]  July 24, 2010 12:39 PM     Additional Follow-up for Phone Call Additional follow up Details #2::    called and spoke with pt  and she is willing to do the PET scan and she stated that her surgery is scheduled for jan 6.  she stated that she is willing to try this if it means that she may not have to have the surgery.  i explained to the pt that we would schedule the pet scan and call  her once this appt has been made---she stated that the work number is better to get in touch with her. [address] [facility]  July 24, 2010 2:50 PM

## 2010-09-03 NOTE — Progress Notes (Signed)
Summary: Appt asap   Phone Note From Other Clinic   Caller: [phone] [facility] @ Dr Kriste Basque Call For: Dr Marina Goodell Reason for Call: Schedule Patient Appt Summary of Call: Wants pt to be seen asap by Dr Marina Goodell and not PA. Nausea, vomitting. Initial call taken by: Leanor Kail [facility],  July 15, 2010 4:42 PM  Follow-up for Phone Call        Lmom for Almyra Free to call us back- Dr Marina Goodell does not have another appointment until 07/28/10. Graciella Freer 07/15/10@1654    Dr Brett Fairy called back this am Dr Kriste Basque is asking you see the patient not an extender.  There no longer is an opening on the 27th or that week on your schedule and he is requesting you see the patient that week.  Dr Marina Goodell please advise.  See office visit from Dr Kriste Basque from yesterday. Follow-up by: Darcey Nora RN, CGRN,  July 16, 2010 10:43 AM  Additional Follow-up for Phone Call Additional follow up Details #1::        If the patient is sick, she needs to see who ever is available ASAP or go to ER. If not, the my next available openning will have to do Additional Follow-up by: Hilarie Fredrickson MD,  July 16, 2010 10:57 AM    Additional Follow-up for Phone Call Additional follow up Details #2::    Almyra Free advised of Dr Lamar Sprinkles recommendations.  patient is scheduled for 08-07-10 2:45.  patient has been added to the cancelations lists Follow-up by: Darcey Nora RN, CGRN,  July 16, 2010 11:18 AM

## 2010-09-03 NOTE — Letter (Signed)
Summary: [facility]  [facility]   Imported By: Lester  07/16/2010 10:00:37  _____________________________________________________________________  External Attachment:    Type:   Image     Comment:   External Document

## 2010-09-03 NOTE — Consult Note (Signed)
Summary: [facility]   Imported By: Sherian Rein 08/06/2010 07:19:00  _____________________________________________________________________  External Attachment:    Type:   Image     Comment:   External Document

## 2010-09-03 NOTE — Miscellaneous (Signed)
Summary: Orders Update   Clinical Lists Changes  Orders: Added new Referral order of Surgical Referral (Surgery) - Signed 

## 2010-09-03 NOTE — Progress Notes (Signed)
Summary: PET scan/surgery  Phone Note Call from Patient Call back at Work Phone [phone]   Caller: Patient Call For: Einar Nolasco Reason for Call: Talk to Nurse Summary of Call: Patient is scheduled for surgery tomorrow.  Patient was to have PET scan before surgery.  I see where PET scan was scheduled for 12/29, but patient is saying she never recieved call or any other communication to let her know about the PET scan.  Requesting to speak to nurse. Initial call taken by: Lehman Prom,  August 06, 2010 11:30 AM  Follow-up for Phone Call        Spoke with pt.  She states that she is sched to have her spleenectomy tommorrow and never had PET scan as advised.  She states that from what she understood "this was optional anyway" and wishes to proceed with the surgery anyway since she has already made arrangements withher employer.  She wants to know what SN thinks.  I am unsure why PET was never done- looks like peer to peer was done and then pt was just never informed? Pls advise thanks Follow-up by: Vernie Murders,  August 06, 2010 3:00 PM  Additional Follow-up for Phone Call Additional follow up Details #1::        per SN----libby set up the PET scan and SN called and spoke with Dr. Abbey Chatters and cancelled the surgery until after the PET scan. [address] Loop CMA  August 07, 2010 10:26 AM

## 2010-09-03 NOTE — Progress Notes (Signed)
Summary: speak to [facility]  Phone Note Call from Patient Call back at Work Phone [phone]   Caller: Patient Call For: young Summary of Call: Pt states she needs to speak with Almyra Free in ref to her upcoming surgery tomorrow. Initial call taken by: Darletta Moll,  August 06, 2010 3:28 PM  Follow-up for Phone Call        spoke to pt she wants to do the pet before surgery, but wants to talk to sn about the surgery before she does it  Follow-up by: Oneita Jolly,  August 06, 2010 3:43 PM

## 2010-09-03 NOTE — Assessment & Plan Note (Signed)
Summary: HFU/MHH   Primary Care Atonya Templer:  Alroy Dust, MD  CC:  4 month ROV & post hosp visit....  History of Present Illness: 65 y/o BF here for a follow up visit... he has multiple medical problems as noted below...     ~  September 26, 2009:  she has poorly controlled DM- not on diet & not exercising (**we discussed diet+exercise program needed to lose wt)... on Lantus 30-40 u daily (+ below) w/ sugars "up & down" per pt (**we discussed changing Lantus to Levemir)... BP under fair control but not taking her Lasix due to legs cramping (**we discussed changing Diovan to Diovan/HCT)... Lipid profiles w/ elevated TG's and she never took the Fenoglide last yr (**we discussed adding FENOBIBRATE)... she has severe orthopedic problems w/ bilat TKR's now;  moderate Psoriasis on Humera per DrTafeen;  & notes some vague abd pain w/ intermittent nausea (s/p cholecystectomy) & alt diarrhea/ constip- due for colon & we will set up GI appt w/ DrPerry.   ~  March 12, 2010:  she had GI eval 6/11 w/ EGD essent neg & Colon showing divertics, polyps, AVM> path= tubular adenoma & f/u planned 22yrs... she has lost 12# down to 174# now> but sugars are still 150-180+ at home & A1c today= 10.9 so we will incr the Levemir dose to 50u & she prob needs Humalog as well (rec- short term f/u w/ Qid BS checks the week prior)... of concern her Creat is up to 2.0 on Diovan/Hct & we will change back to plain Diovan... c/o palpit & incr SOB w/ stress & we discussed adding Alprazolam 0.5mg  Prn... finally she notes IBS symptoms w/ diarrhea but INTOL to metamucil, therefore try Questran Light 1/2 packet per day...   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 MRI of the Abd showed  splenomegaly & numerous  ~1cm lesions throughout ?etiology...  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 she was also seen by DrFox for Nephrology & he concurred w/ the renal insuffic eval but on recheck of her labs- Calcium was 13.7 (?etiology- review of all prev labs showed normal calcium levels in the 9-10 range, last checked 04/28/10= 10.1) & he sent her to [facility] where she was adm by the Hospitalists...  extensive eval in the hosp by TH & DrGranfortuna failed to confirm an etiology for the Hypercalcemia (Ca= 12.8 --> 11.2 at disch after Rx)- the consensus was poss Sarcoid given the splenic lesions & no other explanation (ACE was not checked) & she was started on Pred20mg /d & disch home... DrG did bone marrow & final path was neg- no pathologic diagnosis in bone marrow...  Since disch she is confused by all this & feeling about the same> now w/ predom GI manifestations of abd discomfort, nausea, +reflux, no vomiting, constip, weakness, etc... we discussed incr Nexium to 40mg Bid & ask DrPerry to review her situation & his thoughts re her splenic abn as well... f/u LABS TODAY>  BS=368;  BUN=32, Creat=2.2;  Ca=12.6;  Hg=10.5, ACE=63 (8-52)...   Current Problems:   OBSTRUCTIVE SLEEP APNEA - Sleep Study 11/07 showed RDI=21 w/ desat to 62% during REM.Marland Kitchen. eval by DrSood w/ Rx for CPAP 12... compliance poor- only using it 1-2 times/wk... using FLONASE Qhs... encouraged to use CPAP more regularly & to f/u w/ DrSood regarding the mask interface problems...  ~  8/11:  she reports new mask but still not using CPAP regularly, "I rest fairly well".  BRONCHITIS, ACUTE - no recent symptoms, & improved on ADVAIR 250Bid (using Prn only)& PROAIR as needed.  ~  CXR  1/09 was pre-op for TKR- cardomeg & ?mild vasc congestion.  ~  CXR 2/11 showed norm heart size & vascularity, clear, s/p cholecystectomy, DJD in TSpine.  ~  CXR & CT Chest 11/11 showed sl peribronch thickening, no lung lesions, no signif adenopathy.  HYPERTENSION - taking NORVASC 10mg /d,  DIOVAN 320mg /d, & off the prev diuretics due to leg cramps & renal insuffic... not checking BP's at home... BP here 140/60 and she denies HA, visual changes, CP, palipit, dizziness, syncope, change in dyspnea, etc...  CAD - on ASA 81mg /d... Hx non-obstructive CAD w/ cath 6/07 by DrStuckey showing luminal irregularities & a very tiny first marginal branch of the CIRCw/ ostial lesion... good LVF.  ~  2DEcho 9/07 showed mildly calcif AoV and normal LVF & wall motion.  ~  2DEcho 11/11 showed norm LV wall thickness, norm LVF w/ EF= 60-65%, norm atria, norm valves, trivial peric fluid behind heart.  CEREBROVASCULAR DISEASE - MRA Br 9/07 showed mod intracranial atherosclerotic changes... she remains on ASA 81mg /d without TIA's or other neuro manifestations...   HYPERLIPIDEMIA - on LIPITOR 20mg /d  ~  FLP 7/07 showed TChol 114, TG 85, HDL 28, LDL 69  ~  FLP 4/09 showed TChol 154, TG 192, HDL 34, LDL 82... discussed poss of adding fibrate- hold...  ~  FLP 2/10 on Lip20 showed TChol 159, TG 207, HDL 29, LDL 83... rec> add Fenoglide (she didn't).  ~  FLP 2/11 on Lip20 showed TChol 185, TG 242, HDL 43, LDL 99... add FENOFIBRATE 160mg /d...  ~  FLP 8/11 on Lip20+Feno160 showed TChol 167, TG 216, HDL 37, LDL 96... she stopped Feno160.  DM - currently on LEVEMIR 55u/d & GLIMEPIRIDE 4mg /d,  prev Metformin & Januvia were stopped during 11/11 hosp,  ~  labs 4/08 showed BS=138 & HgA1c=7.5.Marland Kitchen. home BS = 100 to >300... misses doses and not on diet or exercising...  ~  1/09 became hypoglycemic in hosp on 4 meds post knee surg- meds adjusted...  ~  3/09 restarted GLIMEPIRIDE 4mg - 1/2 tab each AM, & continue Lantus & Metformin...  ~   labs 4/09 showed BS= 148, HgA1c= 7.3.Marland Kitchen. rec> back on 4 med regimen due to poor control at home.  ~  6/09 discussed titrating the Lantus up until FBS 100-120 range...  ~  labs 2/10 showed BS= 164, HgA1c= 8.6.Marland KitchenMarland Kitchen very disappointing- needs home BS monitoring, incr Lantus.  ~  labs 2/11 showed BS= 298, A1c= 8.4...  discussed change Lantus to LEVEMIR 40 u daily...  ~  labs 8/11 showed BS= 202, A1c= 10.9.Marland Kitchen. rec> incr Levemir to 50, consider Humalog.  ~  Nov-Dec 2011:  BS up w/ adjust of meds & addition of Pred trial after hosp 11/11...  OBESITY (ICD-278.00) - weight down to 171 after hosp 11/11- doing better on diet, not yet exercising.  ~  weight 220-230# in the early 1990's...  ~  weight 210-220# in the early 2000's...  ~  weight 2/10 = 192#  ~  weight 2/11 = 186#  ~  weight 8/11 = 174#  ~  weight 12/11 = 171#  GERD - on NEXIUM 40mg  before dinner & ZANTAC 300mg  Qhs... w/ increased reflux symptoms w/ noct cough etc...   ~  2/10: discussed optimal Rx w/ Nex before dinner, Zantac300 + Reglan10 at bed, elev HOB, etc.  ~  2/11: she stopped the Reglan, still using Nexium/ Zantac but w/ persist symptoms>  refer to GI for eval.  ~  6/11: Gi f/u DrPerry w/ EGD that was normal... continue Rx.  ~  12/11:  increased GI symptoms post hosp> she will f/u w/ DrPerry for his input.  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  ~  6/11:  f/u colonoscopy by DrPerry showed divertics, 4 polyps, AVM.Marland KitchenMarland Kitchen path= tubular adenoma, f/u 22yrs.  SPLENOMEGALY w/ innumerable  ~1cm lesions ?etiology >> SEE ABOVE, R/O Sarcoid vs Lymphoma & splenectomy planned...  RENAL INSUFFICIENCY (ICD-588.9) >> Creat  ~ 2.0 & eval 10/11 by DrFox- prob due to DM & HBP...  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 she has finished PT and trying water  exerc... on LYRICA 100mg  Qhs, off prev Tramadol Rx...  ~  2010 eval by DrHiatt @ [facility] on Tramadol 50mg , Lyrica 75mg Bid...  GOUT, UNSPECIFIED (ICD-274.9) - Uric in the 7-8 range...on ALLOPURINOL 300mg /d + Indocin Prn...  ANXIETY (ICD-300.00) - she is under mod stress- work, mother died, hospice counselling, etc...  ~  8/11:  rec starting ALPRAZOLAM 0.5mg  Tid for palpit, SOB, anxiety...  ANEMIA-NOS & MGUS >> see eval 10-11/11 by DrGranfortuna w/ bone marrow 11/11 in hosp= neg.  PSORIASIS - eval and Rx per DrTafeen prev on HUMIRA injections- stopped 9/11...   Preventive Screening-Counseling & Management  Alcohol-Tobacco     Smoking Status: never  Allergies: 1)  ! Mucinex (Guaifenesin) 2)  ! * Oxycodone 3)  ! * Latex  Comments:  Nurse/Medical Assistant: The patient's medications and allergies were reviewed with the patient and were updated in the Medication and Allergy Lists.  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 PSORIASIS (ICD-696.1) >>      eval & rx by DrTafeen, prev on Humira (stopped 10/11)  Past Surgical  History: Cholecystectomy Hysterectomy S/P Breast Reduction Surgery S/P right TKR 5/04 DrAlusio S/P left TKR 1/09 DrAlusio Tonsillectomy Appendectomy  Family History: Reviewed history from 12/15/2009 and no changes required. Family History of Breast Cancer: No FH of Colon Cancer: Family History of Diabetes:  Family History of Heart Disease:  Family History of Kidney Disease:  Social History: Reviewed history from 12/15/2009 and no changes required. 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  Alcohol Use - no Daily Caffeine Use  3 per day Illicit Drug Use - no  Review of Systems      See HPI       The patient complains of dyspnea on exertion.  The patient denies anorexia, fever, weight loss, weight gain, vision loss, decreased hearing, hoarseness, chest pain, syncope, peripheral edema, prolonged cough, headaches, hemoptysis, abdominal pain, melena, hematochezia, severe indigestion/heartburn, hematuria, incontinence, muscle weakness, suspicious skin lesions, transient blindness, difficulty walking, depression, unusual weight change, abnormal bleeding, enlarged lymph nodes, and angioedema.    Vital Signs:  Patient profile:   65 year old female Height:      63 inches Weight:      171.25 pounds BMI:     30.45 O2 Sat:      100 % on Room air Temp:     97.3 degrees F oral Pulse rate:   77 / minute BP sitting:   140 / 60  (left arm) Cuff size:   regular  Vitals Entered By: Randell Loop CMA (July 15, 2010 3:11 PM)  O2 Sat at Rest %:  100 O2 Flow:  Room air CC: 4 month ROV & post hosp visit... Is Patient Diabetic? Yes Pain Assessment Patient in pain? no      Comments MEDS UPDATED TODAY WITH PT   Physical Exam  Additional Exam:  WD, Overweight, 64 y/o BF in NAD...  GENERAL:  Alert & oriented; pleasant & cooperative... HEENT:  [place]/AT, EOM-wnl, PERRLA, EACs-clear, TMs-wnl, NOSE-clear, THROAT- clear & wnl... NECK:  Supple w/ fair ROM; no JVD; normal  carotid impulses w/o bruits; palp thyroid, w/o nodules felt; no lymphadenopathy. CHEST:  Clear to P & A; without wheezes/ rales/ or rhonchi. HEART:  Regular Rhythm; gr 1/6 SEM, without rubs/ or gallops. ABDOMEN:  Obese soft & nontender w/ panniculus; normal bowel sounds; no organomegaly or masses detected EXT: without deformities, mod arthritic changes, s/p bilat TKRs; no varicose veins/ +venous insuffic/ tr edema. NEURO:  CN's intact; no focal neuro deficits... DERM:  Psoriasis rash per DrTafeen...    MISC. Report  Procedure date:  07/15/2010  Findings:      LABS 07/15/10 - see lab summary sheet... Reviewed w/ pt 12/15... SN   Impression & Recommendations:  Problem # 1:  HYPERTENSION (ICD-401.9) BP remains controlled & Cardiac stable w/o angina etc... Her updated medication list for this problem includes:    Norvasc 10 Mg Tabs (Amlodipine besylate) .Marland Kitchen... Take 1 by mouth once daily    Diovan 320 Mg Tabs (Valsartan) .Marland Kitchen... Take 1 tab by mouth once daily...  Orders: TLB-BMP (Basic Metabolic Panel-BMET) (80048-METABOL) TLB-Hepatic/Liver Function Pnl (80076-HEPATIC)  Problem # 2:  DM (ICD-250.00) Difficult control >> had to stop Metformin w/ RI... insulin incr in hosp to 55u... disch on Pred 20mg /d for ?sarcoid and BS= 368 today... advised lots of water, continue Levemir, Glimep, off Pred now... The following medications were removed from the medication list:    Metformin Hcl 1000 Mg Tabs (Metformin hcl) .Marland Kitchen... Take one tablet by mouth two times a day    Januvia 100 Mg Tabs (Sitagliptin phosphate) .Marland Kitchen... Take 1 tab by mouth daily... Her updated medication list for this problem includes:    Aspirin 81 Mg Tbec (Aspirin) .Marland Kitchen... Take 1 by mouth once daily...    Diovan 320 Mg Tabs (Valsartan) .Marland Kitchen... Take 1 tab by mouth once daily...    Levemir Flexpen 100 Unit/ml Soln (Insulin detemir) .Marland Kitchen... Take daily as directed (  currently 55 u per day)...    Glimepiride 4 Mg Tabs (Glimepiride) .Marland Kitchen... Take  1 tab by mouth once daily...  Problem # 3:  GERD (ICD-530.81) She has GERD despite max Rx, now nausea etc... wants GI f/u DrPerry & his opinion re: splenic lesion... Her updated medication list for this problem includes:    Nexium 40 Mg Cpdr (Esomeprazole magnesium) .Marland Kitchen... Take 1 cap by mouth twice daily- 30 min before breakfast & dinner...    Ranitidine Hcl 300 Mg Tabs (Ranitidine hcl) .Marland Kitchen... Take 1 tab by mouth at bedtime...  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 may even be therapeutic regarding her hypercalcemia that has otherw been refractory to management... refer to [facility], & DrGranfortuna will proceed w/ triple immunization coverage in advance of this surg.  Problem # 5:  HYPERCALCEMIA (ICD-275.42) Calcium is back up to 12.6 today, even after Pred 20mg /d for 2weeks... it was 12.8 on admission & 11.2 at best in the hosp... of note> her calcium levels were all normal up to 9/11 when it was 10.1 on office labs... the first elev calcium was 10/11 by DrFox during his nephrology eval & it reached 13.7 when he sent her for adm 11/11... in the interim the Humira that she was getting from [facility] had been discontinued due to the splenic abn & concern for poss splenic lymphoma related to this med (but perhaps the anti-TNF antibody was suppressing macrophage induced cacitriol production & preventing the hypercalcemia, which then was "unmasked" when the Humira was stopped)... she did receive Zometa x1 in hosp & DrGranfortuna will proceed w/ another shot in his office...  Problem # 6:  RENAL INSUFFICIENCY (ICD-588.9) As noted by DrFox>  Creat today= 2.2 & we will follow along w/ Nephrology...  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  Problem # 8:  OTHER MEDICAL  PROBLEMS AS NOTED>>>  Complete Medication List: 1)  Fluticasone Propionate 50 Mcg/act Susp (Fluticasone propionate) .... 2 sprays in each nostril at bedtime.Marland KitchenMarland Kitchen 2)  Advair Diskus 250-50 Mcg/dose Misc (Fluticasone-salmeterol) .Marland Kitchen.. 1 puff two times a day 3)  Proair Hfa 108 (90 Base) Mcg/act Aers (Albuterol sulfate) .... 2 puffs up to 4 times daily as needed... 4)  Aspirin 81 Mg Tbec (Aspirin) .... Take 1 by mouth once daily.Marland KitchenMarland Kitchen 5)  Norvasc 10 Mg Tabs (Amlodipine besylate) .... Take 1 by mouth once daily 6)  Diovan 320 Mg Tabs (Valsartan) .... Take 1 tab by mouth once daily.Marland KitchenMarland Kitchen 7)  Lipitor 20 Mg Tabs (Atorvastatin calcium) .... Take 1 tab by mouth once daily.Marland KitchenMarland Kitchen 8)  Levemir Flexpen 100 Unit/ml Soln (Insulin detemir) .... Take daily as directed (currently 55 u per day).Marland KitchenMarland Kitchen 9)  Glimepiride 4 Mg Tabs (Glimepiride) .... Take 1 tab by mouth once daily... 10)  Nexium 40 Mg Cpdr (Esomeprazole magnesium) .... Take 1 cap by mouth twice daily- 30 min before breakfast & dinner... 11)  Ranitidine Hcl 300 Mg Tabs (Ranitidine hcl) .... Take 1 tab by mouth at bedtime... 12)  Allopurinol 300 Mg Tabs (Allopurinol) .... Take 1 tab by mouth once daily... 13)  Lyrica 100 Mg Caps (Pregabalin) .... Take 1 tablet by mouth at bedtime 14)  Alprazolam 0.5 Mg Tabs (Alprazolam) .... Take 1/2 to 1 tab by mouth up to three times a day as needed for  symptoms...  Other Orders: T-Angiotensin i-Converting Enzyme ([id number]) Gastroenterology Referral (GI) TLB-CBC Platelet - w/Differential (85025-CBCD)  Patient Instructions: 1)  Today we updated your med list- see below.... 2)  We increased your NEXIUM to twice daily- taken 30 min before breakfast & dinner... 3)  Today we rechecked your blood work... please call the "phone tree" in a few days for your lab results.Marland KitchenMarland Kitchen 4)  We will arrange for another opinion regarding your unusual situation from DrPerry in GI.Marland KitchenMarland Kitchen 5)  Keep the follow up appt w/ DrGranfortuna in Jan... 6)  Let's plan  a  recheck here in 1 month- call for any problems. Prescriptions: NEXIUM 40 MG  CPDR (ESOMEPRAZOLE MAGNESIUM) take 1 cap by mouth twice daily- 30 min before breakfast & dinner...  #60 x 12   Entered and Authorized by:   Michele Mcalpine MD   Signed by:   Michele Mcalpine MD on 07/15/2010   Method used:   Print then Give to Patient   RxID:   [id number]

## 2010-09-03 NOTE — Progress Notes (Signed)
Summary: nos appt  Phone Note Call from Patient   Caller: [email or web address]  Call For: Thaxton Pelley Summary of Call: In ref to nos from 1/17, pt has appt on 2/7. Initial call taken by: Darletta Moll,  August 19, 2010 9:12 AM

## 2010-09-03 NOTE — Progress Notes (Signed)
Summary: lab results  Phone Note From Other Clinic   Caller: STACY WITH DR Cyndie Chime Summary of Call: Patient has an appointment with Dr. Cyndie Chime today at 3:00pm and she had labs drawn here yesterday and they would like the results faxed to [phone] Initial call taken by: Vedia Coffer,  July 17, 2010 2:17 PM  Follow-up for Phone Call        Faxed labs.//Juanita Follow-up by: Darletta Moll,  July 17, 2010 2:23 PM

## 2010-09-04 ENCOUNTER — Emergency Department (HOSPITAL_COMMUNITY)
Admission: EM | Admit: 2010-09-04 | Discharge: 2010-09-04 | Disposition: A | Discharge status: Home / Self Care | Payer: BC Managed Care – PPO | Attending: Emergency Medicine | Admitting: Emergency Medicine

## 2010-09-04 ENCOUNTER — Emergency Department (HOSPITAL_COMMUNITY): Payer: BC Managed Care – PPO

## 2010-09-04 DIAGNOSIS — E7800 Pure hypercholesterolemia, unspecified: Secondary | ICD-10-CM | POA: Insufficient documentation

## 2010-09-04 DIAGNOSIS — R0602 Shortness of breath: Secondary | ICD-10-CM | POA: Insufficient documentation

## 2010-09-04 DIAGNOSIS — R110 Nausea: Secondary | ICD-10-CM | POA: Insufficient documentation

## 2010-09-04 DIAGNOSIS — I10 Essential (primary) hypertension: Secondary | ICD-10-CM | POA: Insufficient documentation

## 2010-09-04 DIAGNOSIS — R079 Chest pain, unspecified: Secondary | ICD-10-CM | POA: Insufficient documentation

## 2010-09-04 DIAGNOSIS — R109 Unspecified abdominal pain: Secondary | ICD-10-CM | POA: Insufficient documentation

## 2010-09-04 DIAGNOSIS — M109 Gout, unspecified: Secondary | ICD-10-CM | POA: Insufficient documentation

## 2010-09-04 DIAGNOSIS — E119 Type 2 diabetes mellitus without complications: Secondary | ICD-10-CM | POA: Insufficient documentation

## 2010-09-04 DIAGNOSIS — J45909 Unspecified asthma, uncomplicated: Secondary | ICD-10-CM | POA: Insufficient documentation

## 2010-09-08 ENCOUNTER — Ambulatory Visit: Payer: Self-pay | Admitting: Pulmonary Disease

## 2010-09-09 NOTE — Progress Notes (Signed)
Summary: gas  Phone Note Call from Patient Call back at Home Phone [phone]   Caller: Patient Call For: nadel Summary of Call: pt wants a rx for gas- L side x 3 days. [facility] on [address].  Initial call taken by: Tivis Ringer, CNA,  September 02, 2010 10:01 AM  Follow-up for Phone Call        Pt c/o "pulsating" pain on left side  for 3 days.  Pt reports some nausea and one episode of vomiting. Belching alot.   Pt takes Nexium two times a day and Ranitidine at bedtime.  Has taken gas x , mylanta, enema and laxative.  Has had some loose stools since laxative.  Please advise. Abigail Miyamoto RN  September 02, 2010 11:10 AM   Additional Follow-up for Phone Call Additional follow up Details #1::        per SN---otc med to use are for upper gas--mylicon or beano    take 3 times  daily and for lower gas  phazyme take 3 times daily  and add bentyl #90  take one tablet by mouth three times a day for abd cramping.  thanks Randell Loop CMA  September 02, 2010 1:34 PM     Additional Follow-up for Phone Call Additional follow up Details #2::    Spoke with pt and notified of the above recs per SN.  Pt verbalized understanding and rx for bentyl was sent to pharm. Follow-up by: Vernie Murders,  September 02, 2010 2:17 PM  New/Updated Medications: BENTYL 20 MG TABS (DICYCLOMINE HCL) 1 three times a day as needed for abdominal cramping. Prescriptions: BENTYL 20 MG TABS (DICYCLOMINE HCL) 1 three times a day as needed for abdominal cramping.  #90 x 1   Entered by:   Vernie Murders   Authorized by:   Michele Mcalpine MD   Signed by:   Vernie Murders on 09/02/2010   Method used:   Electronically to        [facility] [address] [phone]* (retail)       [address]       [place], [place]  [address]       Ph: [phone] or [phone]       Fax: [phone]   RxID:   [id number]

## 2010-09-22 ENCOUNTER — Other Ambulatory Visit: Payer: Self-pay | Admitting: General Surgery

## 2010-09-22 ENCOUNTER — Encounter (HOSPITAL_COMMUNITY): Payer: BC Managed Care – PPO

## 2010-09-22 DIAGNOSIS — Z01812 Encounter for preprocedural laboratory examination: Secondary | ICD-10-CM | POA: Insufficient documentation

## 2010-09-22 LAB — COMPREHENSIVE METABOLIC PANEL
AST: 14 U/L (ref 0–37)
Albumin: 3.5 g/dL (ref 3.5–5.2)
Alkaline Phosphatase: 80 U/L (ref 39–117)
BUN: 35 mg/dL — ABNORMAL HIGH (ref 6–23)
GFR calc Af Amer: 27 mL/min — ABNORMAL LOW (ref >60)
Potassium: 4.2 mEq/L (ref 3.5–5.1)
Total Protein: 7 g/dL (ref 6.0–8.3)

## 2010-09-22 LAB — DIFFERENTIAL
Basophils Absolute: 0 10*3/uL (ref 0.0–0.1)
Lymphocytes Relative: 17 % (ref 12–46)
Lymphs Abs: 0.7 10*3/uL (ref 0.7–4.0)
Neutro Abs: 2.9 10*3/uL (ref 1.7–7.7)
Neutrophils Relative %: 66 % (ref 43–77)

## 2010-09-22 LAB — CBC
HCT: 29.6 % — ABNORMAL LOW (ref 36.0–46.0)
Hemoglobin: 9.2 g/dL — ABNORMAL LOW (ref 12.0–15.0)
RBC: 3.57 MIL/uL — ABNORMAL LOW (ref 3.87–5.11)
RDW: 14.6 % (ref 11.5–15.5)
WBC: 4.4 10*3/uL (ref 4.0–10.5)

## 2010-09-22 LAB — APTT: aPTT: 32 seconds (ref 24–37)

## 2010-09-22 LAB — SURGICAL PCR SCREEN: MRSA, PCR: NEGATIVE

## 2010-09-22 LAB — PROTIME-INR: INR: 1.11 (ref 0.00–1.49)

## 2010-09-24 ENCOUNTER — Encounter (INDEPENDENT_AMBULATORY_CARE_PROVIDER_SITE_OTHER): Payer: BC Managed Care – PPO | Admitting: Cardiology

## 2010-09-24 ENCOUNTER — Encounter: Payer: Self-pay | Admitting: Cardiology

## 2010-09-24 DIAGNOSIS — I10 Essential (primary) hypertension: Secondary | ICD-10-CM

## 2010-09-24 DIAGNOSIS — R0609 Other forms of dyspnea: Secondary | ICD-10-CM

## 2010-09-28 ENCOUNTER — Telehealth: Payer: Self-pay | Admitting: Cardiology

## 2010-09-29 ENCOUNTER — Telehealth: Payer: Self-pay | Admitting: Pulmonary Disease

## 2010-09-29 ENCOUNTER — Other Ambulatory Visit: Payer: Self-pay | Admitting: General Surgery

## 2010-09-29 ENCOUNTER — Encounter: Payer: Self-pay | Admitting: Pulmonary Disease

## 2010-09-29 ENCOUNTER — Inpatient Hospital Stay (HOSPITAL_COMMUNITY)
Admission: RE | Admit: 2010-09-29 | Discharge: 2010-10-16 | DRG: 468 | Disposition: A | Discharge status: Home / Self Care | Payer: BC Managed Care – PPO | Source: Ambulatory Visit | Attending: General Surgery | Admitting: General Surgery

## 2010-09-29 DIAGNOSIS — J449 Chronic obstructive pulmonary disease, unspecified: Secondary | ICD-10-CM | POA: Diagnosis present

## 2010-09-29 DIAGNOSIS — IMO0002 Reserved for concepts with insufficient information to code with codable children: Secondary | ICD-10-CM | POA: Diagnosis not present

## 2010-09-29 DIAGNOSIS — D869 Sarcoidosis, unspecified: Principal | ICD-10-CM | POA: Diagnosis present

## 2010-09-29 DIAGNOSIS — M1990 Unspecified osteoarthritis, unspecified site: Secondary | ICD-10-CM | POA: Diagnosis present

## 2010-09-29 DIAGNOSIS — D739 Disease of spleen, unspecified: Secondary | ICD-10-CM | POA: Diagnosis present

## 2010-09-29 DIAGNOSIS — Z8673 Personal history of transient ischemic attack (TIA), and cerebral infarction without residual deficits: Secondary | ICD-10-CM

## 2010-09-29 DIAGNOSIS — M79609 Pain in unspecified limb: Secondary | ICD-10-CM | POA: Diagnosis not present

## 2010-09-29 DIAGNOSIS — I129 Hypertensive chronic kidney disease with stage 1 through stage 4 chronic kidney disease, or unspecified chronic kidney disease: Secondary | ICD-10-CM | POA: Diagnosis present

## 2010-09-29 DIAGNOSIS — R161 Splenomegaly, not elsewhere classified: Secondary | ICD-10-CM | POA: Diagnosis present

## 2010-09-29 DIAGNOSIS — Y836 Removal of other organ (partial) (total) as the cause of abnormal reaction of the patient, or of later complication, without mention of misadventure at the time of the procedure: Secondary | ICD-10-CM | POA: Diagnosis not present

## 2010-09-29 DIAGNOSIS — R509 Fever, unspecified: Secondary | ICD-10-CM | POA: Diagnosis not present

## 2010-09-29 DIAGNOSIS — N179 Acute kidney failure, unspecified: Secondary | ICD-10-CM | POA: Diagnosis present

## 2010-09-29 DIAGNOSIS — E119 Type 2 diabetes mellitus without complications: Secondary | ICD-10-CM | POA: Diagnosis present

## 2010-09-29 DIAGNOSIS — R5381 Other malaise: Secondary | ICD-10-CM | POA: Diagnosis not present

## 2010-09-29 DIAGNOSIS — J4489 Other specified chronic obstructive pulmonary disease: Secondary | ICD-10-CM | POA: Diagnosis present

## 2010-09-29 DIAGNOSIS — D62 Acute posthemorrhagic anemia: Secondary | ICD-10-CM | POA: Diagnosis not present

## 2010-09-29 DIAGNOSIS — R5383 Other fatigue: Secondary | ICD-10-CM | POA: Diagnosis not present

## 2010-09-29 DIAGNOSIS — Z6830 Body mass index (BMI) 30.0-30.9, adult: Secondary | ICD-10-CM

## 2010-09-29 DIAGNOSIS — J90 Pleural effusion, not elsewhere classified: Secondary | ICD-10-CM | POA: Diagnosis not present

## 2010-09-29 DIAGNOSIS — N289 Disorder of kidney and ureter, unspecified: Secondary | ICD-10-CM | POA: Diagnosis present

## 2010-09-29 DIAGNOSIS — K219 Gastro-esophageal reflux disease without esophagitis: Secondary | ICD-10-CM | POA: Diagnosis present

## 2010-09-29 DIAGNOSIS — I2510 Atherosclerotic heart disease of native coronary artery without angina pectoris: Secondary | ICD-10-CM | POA: Diagnosis present

## 2010-09-29 DIAGNOSIS — N189 Chronic kidney disease, unspecified: Secondary | ICD-10-CM | POA: Diagnosis present

## 2010-09-29 DIAGNOSIS — J189 Pneumonia, unspecified organism: Secondary | ICD-10-CM | POA: Diagnosis not present

## 2010-09-29 DIAGNOSIS — D72829 Elevated white blood cell count, unspecified: Secondary | ICD-10-CM | POA: Diagnosis not present

## 2010-09-29 DIAGNOSIS — M109 Gout, unspecified: Secondary | ICD-10-CM | POA: Diagnosis not present

## 2010-09-29 LAB — GLUCOSE, CAPILLARY
Glucose-Capillary: 109 mg/dL — ABNORMAL HIGH (ref 70–99)
Glucose-Capillary: 74 mg/dL (ref 70–99)
Glucose-Capillary: 78 mg/dL (ref 70–99)
Glucose-Capillary: 95 mg/dL (ref 70–99)

## 2010-09-29 NOTE — Assessment & Plan Note (Signed)
Summary: surgical clearance- previous pt of tw. open splenectomy. per ...  Medications Added COREG 6.25 MG TABS (CARVEDILOL) one twice a day        Visit Type:  Follow-up Primary Provider:  Alroy Dust, MD  CC:  Surgical clearance- splenectomy.  History of Present Illness: 65 yo with history of DM, HTN, and hypercalcemia with sarcoidosis versus lymphoma presents for cardiology evaluation prior to splenectomy.  Patient has been found to have splenomegaly with innumerable small splenic nodules.  Bone marrow biopsy was unrevealing.  She has hypercalcemia of uncertain etiology.  She is felt to have either sarcoidosis or lymphoma as underlying cause and is planned for open splenectomy on 2/28.    Patient had a heart cath in 2007 with a 90% stenosis in a tiny OM.  Echo in 11/11 showed EF 60-65% and  no significant valvular abnormality.  Patient reports generalized fatigue.  Her legs feel weak.  She is able to walk on flat ground without dyspnea and can climb a flight of steps but is short of breath at the top.  She has had some exertional dyspnea chronically, this actually prompted her cath in 2007.  No chest pain.  She has had left-sided abdominal pain that is worse with inspiration for the last month.  I suspect that this is due to splenomegaly.  SBP has been running up to 180 at home and is usually > 140.    ECG: NSR, normal  Labs (2/12): K 4.2, creatinine 2.23  Current Medications (verified): 1)  Fluticasone Propionate 50 Mcg/act Susp (Fluticasone Propionate) .... 2 Sprays in Each Nostril At Bedtime.Marland KitchenMarland Kitchen 2)  Advair Diskus 250-50 Mcg/dose  Misc (Fluticasone-Salmeterol) .Marland Kitchen.. 1 Puff Two Times A Day 3)  Proair Hfa 108 (90 Base) Mcg/act  Aers (Albuterol Sulfate) .... 2 Puffs Up To 4 Times Daily As Needed... 4)  Aspirin 81 Mg  Tbec (Aspirin) .... Take 1 By Mouth Once Daily.Marland KitchenMarland Kitchen 5)  Norvasc 10 Mg  Tabs (Amlodipine Besylate) .... Take 1 By Mouth Once Daily 6)  Diovan 320 Mg Tabs (Valsartan) .... Take 1  Tab By Mouth Once Daily.Marland KitchenMarland Kitchen 7)  Lipitor 20 Mg  Tabs (Atorvastatin Calcium) .... Take 1 Tab By Mouth Once Daily.Marland KitchenMarland Kitchen 8)  Levemir Flexpen 100 Unit/ml Soln (Insulin Detemir) .... Take Daily As Directed (Currently 55 U Per Day)... 9)  Glimepiride 4 Mg  Tabs (Glimepiride) .... Take 1 Tab By Mouth Once Daily... 10)  Nexium 40 Mg  Cpdr (Esomeprazole Magnesium) .... Take 1 Cap By Mouth Twice Daily- 30 Min Before Breakfast & Dinner... 11)  Ranitidine Hcl 300 Mg Tabs (Ranitidine Hcl) .... Take 1 Tab By Mouth At Bedtime... 12)  Allopurinol 300 Mg Tabs (Allopurinol) .... Take 1 Tab By Mouth Once Daily... 13)  Lyrica 100 Mg  Caps (Pregabalin) .... Take 1 Tablet By Mouth At Bedtime 14)  Alprazolam 0.5 Mg Tabs (Alprazolam) .... Take 1/2 To 1 Tab By Mouth Up To Three Times A Day As Needed For Symptoms... 15)  Bentyl 20 Mg Tabs (Dicyclomine Hcl) .Marland Kitchen.. 1 Three Times A Day As Needed For Abdominal Cramping.  Allergies: 1)  ! Mucinex (Guaifenesin) 2)  ! * Oxycodone 3)  ! Codeine 4)  ! * Latex  Past History:  Past Medical History: 1. HYPERCHOLESTEROLEMIA  2. HYPERTENSION: renal artery dopplers 12/11 were normal.  3. CAD: LHC 6/07 with 90% ostial lesion in tiny obtuse marginal.  4. GASTRITIS 5. ASTHMA  6. OSA: CPAP is not currently working 7. CKD: Due to diabetes  and HTN 8. DM  9. OBESITY 10. GERD 11. DIVERTICULOSIS OF COLON  12. COLONIC POLYPS  13. Splenomegaly with innumerable small splenic nodules.  This is thought to be sarcoidosis versus lymphoma.  Bone marrow biopsy was unrevealing.  ACE level was elevated in 12/11.  14. DEGENERATIVE JOINT DISEASE: s/p bilateral TKR 15. GOUT, UNSPECIFIED  16. ANXIETY 17. ANEMIA-NOS  18. HYPERCALCEMIA (ICD-275.42): Related to sarcoidosis versus lymphoma.   19. 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 21. PSORIASIS (ICD-696.1) >>      eval & rx  by DrTafeen, prev on Humira (stopped 10/11) 22. 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  Patient has never smoked.  Alcohol Use - no Daily Caffeine Use  3 per day Illicit Drug Use - no  Review of Systems       All systems reviewed and negative except as per HPI.   Vital Signs:  Patient profile:   65 year old female Height:      63 inches Weight:      169.25 pounds BMI:     30.09 Pulse rate:   61 / minute Pulse rhythm:   regular Resp:     18 per minute BP sitting:   163 / 82  (right arm) Cuff size:   large  Vitals Entered By: Vikki Ports (September 24, 2010 4:02 PM)  Physical Exam  General:  Well developed, well nourished, in no acute distress.  Obese.  Head:  normocephalic and atraumatic Nose:  no deformity, discharge, inflammation, or lesions Mouth:  Teeth, gums and palate normal. Oral mucosa normal. Neck:  Neck supple, no JVD. No masses, thyromegaly or abnormal cervical nodes. Lungs:  Clear bilaterally to auscultation and percussion. Heart:  Non-displaced PMI, chest non-tender; regular rate and rhythm, S1, S2 without murmurs, rubs or gallops. Carotid upstroke normal, no bruit. Pedals normal pulses. No edema, no varicosities. Abdomen:  Bowel sounds positive; abdomen soft and non-tender.  Spleen tip is palpable.  Extremities:  No clubbing or cyanosis. Neurologic:  Alert and oriented x 3. Skin:  Intact without lesions or rashes. Psych:  Normal affect.   Impression & Recommendations:  Problem # 1:  PREOPERATIVE ASSESSMENT Patient needs open splenectomy.  She denies chest pain.  She has exertional dyspnea but is able to exert herself to > 4 METS of activity.  Exertional dyspnea is chronic and unchanged for years.  She had a cath in 2007 with mild CAD.  I suspect a lot of her fatigue  is due to her hypercalcemia and underlying sarcoidosis versus lymphoma.  EF was normal on recent echo.  Given HTN and mild CAD, I would suggest that she start Coreg 6.25 mg two times a day prior to surgery.  I do not think that a stress test is warranted at this time.    Problem # 2:  HYPERTENSION (ICD-401.9) As above, starting Coreg.  This can be titrated up as tolerated for BP control, keeping pulse > 50.   Patient Instructions: 1)  Your physician has recommended you make the following change in your medication:  2)  Start Coreg(carvedilol) 6.25mg  twice a day. 3)  Take and record your heart rate and blood pressure. A nurse will call you Monday  to get the readings. 4)  Your physician recommends that you schedule a follow-up appointment as needed with Dr Shirlee Latch. Prescriptions: COREG 6.25 MG TABS (CARVEDILOL) one twice a day  #60 x 11   Entered by:   Katina Dung, RN, BSN   Authorized by:   Marca Ancona, MD   Signed by:   Katina Dung, RN, BSN on 09/24/2010   Method used:   Electronically to        [facility] [address] [phone]* (retail)       [address]       [place], [place]  [address]       Ph: [phone] or [phone]       Fax: [phone]   RxID:   [id number]

## 2010-09-30 ENCOUNTER — Other Ambulatory Visit: Payer: Self-pay | Admitting: General Surgery

## 2010-09-30 DIAGNOSIS — R5082 Postprocedural fever: Secondary | ICD-10-CM

## 2010-09-30 DIAGNOSIS — D7389 Other diseases of spleen: Secondary | ICD-10-CM

## 2010-09-30 DIAGNOSIS — R161 Splenomegaly, not elsewhere classified: Secondary | ICD-10-CM

## 2010-09-30 DIAGNOSIS — D869 Sarcoidosis, unspecified: Secondary | ICD-10-CM

## 2010-09-30 LAB — CBC
HCT: 28.5 % — ABNORMAL LOW (ref 36.0–46.0)
RBC: 3.36 MIL/uL — ABNORMAL LOW (ref 3.87–5.11)
RDW: 15 % (ref 11.5–15.5)
WBC: 20.4 10*3/uL — ABNORMAL HIGH (ref 4.0–10.5)

## 2010-09-30 LAB — GLUCOSE, CAPILLARY
Glucose-Capillary: 108 mg/dL — ABNORMAL HIGH (ref 70–99)
Glucose-Capillary: 133 mg/dL — ABNORMAL HIGH (ref 70–99)
Glucose-Capillary: 164 mg/dL — ABNORMAL HIGH (ref 70–99)
Glucose-Capillary: 154 mg/dL — ABNORMAL HIGH (ref 70–99)

## 2010-09-30 LAB — BASIC METABOLIC PANEL
Chloride: 106 mEq/L (ref 96–112)
GFR calc non Af Amer: 24 mL/min — ABNORMAL LOW (ref >60)
Glucose, Bld: 117 mg/dL — ABNORMAL HIGH (ref 70–99)
Potassium: 4.5 mEq/L (ref 3.5–5.1)
Sodium: 134 mEq/L — ABNORMAL LOW (ref 135–145)

## 2010-09-30 LAB — HEMOGLOBIN AND HEMATOCRIT, BLOOD: Hemoglobin: 8.4 g/dL — ABNORMAL LOW (ref 12.0–15.0)

## 2010-10-01 ENCOUNTER — Inpatient Hospital Stay (HOSPITAL_COMMUNITY): Payer: BC Managed Care – PPO

## 2010-10-01 DIAGNOSIS — D7389 Other diseases of spleen: Secondary | ICD-10-CM

## 2010-10-01 DIAGNOSIS — D869 Sarcoidosis, unspecified: Secondary | ICD-10-CM

## 2010-10-01 DIAGNOSIS — R5082 Postprocedural fever: Secondary | ICD-10-CM

## 2010-10-01 LAB — DIFFERENTIAL
Basophils Relative: 0 % (ref 0–1)
Eosinophils Relative: 0 % (ref 0–5)
Lymphocytes Relative: 6 % — ABNORMAL LOW (ref 12–46)
Neutrophils Relative %: 83 % — ABNORMAL HIGH (ref 43–77)

## 2010-10-01 LAB — BASIC METABOLIC PANEL
BUN: 26 mg/dL — ABNORMAL HIGH (ref 6–23)
CO2: 22 mEq/L (ref 19–32)
Chloride: 106 mEq/L (ref 96–112)
Glucose, Bld: 135 mg/dL — ABNORMAL HIGH (ref 70–99)
Potassium: 4.5 mEq/L (ref 3.5–5.1)

## 2010-10-01 LAB — ANGIOTENSIN CONVERTING ENZYME: Angiotensin-Converting Enzyme: 64 U/L — ABNORMAL HIGH (ref 8–52)

## 2010-10-01 LAB — GLUCOSE, CAPILLARY
Glucose-Capillary: 134 mg/dL — ABNORMAL HIGH (ref 70–99)
Glucose-Capillary: 154 mg/dL — ABNORMAL HIGH (ref 70–99)

## 2010-10-01 LAB — CBC
HCT: 25.8 % — ABNORMAL LOW (ref 36.0–46.0)
MCV: 84.6 fL (ref 78.0–100.0)
RBC: 3.05 MIL/uL — ABNORMAL LOW (ref 3.87–5.11)
WBC: 32.7 10*3/uL — ABNORMAL HIGH (ref 4.0–10.5)

## 2010-10-01 LAB — LIPASE, BLOOD: Lipase: 29 U/L (ref 11–59)

## 2010-10-01 LAB — AMYLASE, BODY FLUID

## 2010-10-01 LAB — HEMOGLOBIN A1C
Hgb A1c MFr Bld: 6.9 % — ABNORMAL HIGH (ref <5.7)
Mean Plasma Glucose: 151 mg/dL — ABNORMAL HIGH (ref <117)

## 2010-10-01 NOTE — Op Note (Signed)
[name], [name]            ACCOUNT NO.:  [id number]  MEDICAL RECORD NO.:  [id number]           PATIENT TYPE:  I  LOCATION:  0004                         FACILITY:  [facility]  PHYSICIAN:  Adolph Pollack, M.D.DATE OF BIRTH:  Sep 26, 1945  DATE OF PROCEDURE:  09/29/2010 DATE OF DISCHARGE:                              OPERATIVE REPORT   PREOPERATIVE DIAGNOSIS:  Splenic tumors with splenomegaly.  POSTOPERATIVE DIAGNOSIS:  Splenic tumors with splenomegaly.  PROCEDURE:  Open splenectomy.  SURGEON:  Adolph Pollack, M.D.  ASSISTANT:  Currie Paris, M.D.  ANESTHESIA:  General.  INDICATIONS:  This is a 65 year old female who has chronic renal insufficiency.  A renal ultrasound was performed which also demonstrated splenic abnormalities.  An MRI of the abdomen showed splenomegaly and numerous splenic tumors with questionable etiology.  She also has hypercalcemia.  She underwent a PET CT demonstrating significant hypermetabolic activity in the spleen and some lymph nodes around the right iliac chain.  She now presents for elective open splenectomy. Procedure risks and aftercare were discussed with her preoperatively.  TECHNIQUE:  She is brought to the operating room, placed supine on the operating table and the general anesthetic was administered.  A Foley catheter was inserted.  The abdominal wall was widely sterilely prepped and draped.  Left subcostal incision was made dividing the skin, subcutaneous tissue, fascial layers, and peritoneum and entering and entering the peritoneal cavity.  A Bookwalter retractor was used for exposure.  An enlarged spleen with multiple whitish splenic tumors was noted.  Beginning laterally, I mobilized the divided splenic attachments to the colon and proceeded superiorly to divide these attachments to the diaphragm using the LigaSure.  Inferolaterally, I then divided small splenic attachments and small veins mobilizing the lateral and  inferolateral aspect in the spleen.  I then approached the gastrosplenic ligament and divided short gastric vessels using the LigaSure.  Following this, I identified the splenic artery and splenic vein near the hilum.  Using blunt dissection, I was able to isolate and create windows around the splenic artery and splenic vein.  I then ligated the splenic artery twice with 0 silk sutures proximally as well as a hemoclip and once distally and divided. Similarly, I ligated the splenic vein with 0 silk sutures twice staying inside and once on the splenic hilum side and applied a hemoclip and divided it.  I noted that the hilum of the spleen was very closely adherent to the pancreas and staying on the hilum of spleen I divided some small vessels and some of the tissue from the remaining gastrosplenic ligament using the LigaSure device.  I then rotated the spleen and was able to bluntly separate some of the superior diaphragmatic attachments to the spleen and removed the spleen in its entirety and sent it to pathology.  There was some bleeding from a short gastric vessel in the stomach and I suture-ligated this with two 2-0 silk sutures.  Following this, I irrigated out the wound and examined the tail of the pancreas was which was intact.  Given the close proximity of the pancreas to the spleen, I decided to leave a drain.  I inspected the greater curvature of the stomach and the short gastric vessels and everything was hemostatic.  Following this, I then made a stab incision in the left lower quadrant and introduced a size 19 Blake drain into the wound and placed it in the splenic bed.  It was anchored to the skin with 3-0 nylon suture.  All the retractors were removed and all laparotomy pads were removed. Estimated blood loss to this point was 350 mL.  I then closed the fascia of the incision in 2 layers.  The posterior layer and anterior layer were both closed with running #1 PDS  sutures. Needle, sponge, and instrument counts were reportedly correct.  The subcutaneous tissue was irrigated, and the skin was closed with staples. Sterile dressing was applied.  She tolerated the procedure well without any apparent complications and was taken to recovery in satisfactory condition.     Adolph Pollack, M.D.     Kari Baars  D:  09/29/2010  T:  09/29/2010  Job:  [id number]  Electronically Signed by Avel Peace M.D. on 10/01/2010 10:04:19 AM

## 2010-10-02 ENCOUNTER — Inpatient Hospital Stay (HOSPITAL_COMMUNITY): Payer: BC Managed Care – PPO

## 2010-10-02 DIAGNOSIS — J9819 Other pulmonary collapse: Secondary | ICD-10-CM

## 2010-10-02 LAB — CBC
HCT: 22.5 % — ABNORMAL LOW (ref 36.0–46.0)
MCH: 25.9 pg — ABNORMAL LOW (ref 26.0–34.0)
MCV: 84.6 fL (ref 78.0–100.0)
RBC: 2.66 MIL/uL — ABNORMAL LOW (ref 3.87–5.11)
WBC: 29.9 10*3/uL — ABNORMAL HIGH (ref 4.0–10.5)

## 2010-10-02 LAB — BASIC METABOLIC PANEL
BUN: 29 mg/dL — ABNORMAL HIGH (ref 6–23)
CO2: 22 mEq/L (ref 19–32)
Glucose, Bld: 105 mg/dL — ABNORMAL HIGH (ref 70–99)
Potassium: 4.4 mEq/L (ref 3.5–5.1)
Sodium: 132 mEq/L — ABNORMAL LOW (ref 135–145)

## 2010-10-02 LAB — GLUCOSE, CAPILLARY
Glucose-Capillary: 131 mg/dL — ABNORMAL HIGH (ref 70–99)
Glucose-Capillary: 194 mg/dL — ABNORMAL HIGH (ref 70–99)
Glucose-Capillary: 96 mg/dL (ref 70–99)

## 2010-10-02 LAB — PREPARE RBC (CROSSMATCH)

## 2010-10-03 ENCOUNTER — Inpatient Hospital Stay (HOSPITAL_COMMUNITY): Payer: BC Managed Care – PPO

## 2010-10-03 DIAGNOSIS — N189 Chronic kidney disease, unspecified: Secondary | ICD-10-CM

## 2010-10-03 LAB — GLUCOSE, CAPILLARY
Glucose-Capillary: 149 mg/dL — ABNORMAL HIGH (ref 70–99)
Glucose-Capillary: 211 mg/dL — ABNORMAL HIGH (ref 70–99)

## 2010-10-03 LAB — CROSSMATCH
ABO/RH(D): O POS
Antibody Screen: NEGATIVE
Unit division: 0

## 2010-10-03 LAB — BASIC METABOLIC PANEL
Calcium: 8.8 mg/dL (ref 8.4–10.5)
GFR calc Af Amer: 24 mL/min — ABNORMAL LOW (ref >60)
GFR calc non Af Amer: 20 mL/min — ABNORMAL LOW (ref >60)
Sodium: 134 mEq/L — ABNORMAL LOW (ref 135–145)

## 2010-10-03 LAB — CBC
MCHC: 31.5 g/dL (ref 30.0–36.0)
RDW: 14.9 % (ref 11.5–15.5)

## 2010-10-04 ENCOUNTER — Inpatient Hospital Stay (HOSPITAL_COMMUNITY): Payer: BC Managed Care – PPO

## 2010-10-04 LAB — CBC
MCHC: 30.4 g/dL (ref 30.0–36.0)
MCV: 86 fL (ref 78.0–100.0)
Platelets: 566 10*3/uL — ABNORMAL HIGH (ref 150–400)
RDW: 15.2 % (ref 11.5–15.5)
WBC: 21.2 10*3/uL — ABNORMAL HIGH (ref 4.0–10.5)

## 2010-10-04 LAB — GLUCOSE, CAPILLARY
Glucose-Capillary: 128 mg/dL — ABNORMAL HIGH (ref 70–99)
Glucose-Capillary: 142 mg/dL — ABNORMAL HIGH (ref 70–99)
Glucose-Capillary: 223 mg/dL — ABNORMAL HIGH (ref 70–99)
Glucose-Capillary: 119 mg/dL — ABNORMAL HIGH (ref 70–99)

## 2010-10-04 LAB — BASIC METABOLIC PANEL
BUN: 27 mg/dL — ABNORMAL HIGH (ref 6–23)
Calcium: 8.6 mg/dL (ref 8.4–10.5)
GFR calc non Af Amer: 22 mL/min — ABNORMAL LOW (ref >60)
Potassium: 4.5 mEq/L (ref 3.5–5.1)

## 2010-10-05 ENCOUNTER — Inpatient Hospital Stay (HOSPITAL_COMMUNITY): Payer: BC Managed Care – PPO

## 2010-10-05 DIAGNOSIS — R161 Splenomegaly, not elsewhere classified: Secondary | ICD-10-CM

## 2010-10-05 LAB — BASIC METABOLIC PANEL
BUN: 20 mg/dL (ref 6–23)
CO2: 20 mEq/L (ref 19–32)
Chloride: 112 mEq/L (ref 96–112)
Creatinine, Ser: 1.73 mg/dL — ABNORMAL HIGH (ref 0.4–1.2)
Potassium: 4.1 mEq/L (ref 3.5–5.1)

## 2010-10-05 LAB — DIFFERENTIAL
Basophils Absolute: 0 K/uL (ref 0.0–0.1)
Basophils Relative: 0 % (ref 0–1)
Eosinophils Absolute: 0.8 K/uL — ABNORMAL HIGH (ref 0.0–0.7)
Eosinophils Relative: 3 % (ref 0–5)
Lymphocytes Relative: 8 % — ABNORMAL LOW (ref 12–46)
Lymphs Abs: 2.1 K/uL (ref 0.7–4.0)
Monocytes Absolute: 2.7 K/uL — ABNORMAL HIGH (ref 0.1–1.0)
Monocytes Relative: 10 % (ref 3–12)
Neutro Abs: 21.2 K/uL — ABNORMAL HIGH (ref 1.7–7.7)
Neutrophils Relative %: 79 % — ABNORMAL HIGH (ref 43–77)

## 2010-10-05 LAB — GLUCOSE, CAPILLARY: Glucose-Capillary: 99 mg/dL (ref 70–99)

## 2010-10-05 LAB — CBC
Hemoglobin: 9.2 g/dL — ABNORMAL LOW (ref 12.0–15.0)
MCH: 26.1 pg (ref 26.0–34.0)
MCV: 85.8 fL (ref 78.0–100.0)
RBC: 3.52 MIL/uL — ABNORMAL LOW (ref 3.87–5.11)
WBC: 26.8 10*3/uL — ABNORMAL HIGH (ref 4.0–10.5)

## 2010-10-05 LAB — PROTIME-INR: Prothrombin Time: 16 seconds — ABNORMAL HIGH (ref 11.6–15.2)

## 2010-10-05 LAB — AMYLASE, BODY FLUID: Amylase, Fluid: 41784 U/L

## 2010-10-06 ENCOUNTER — Inpatient Hospital Stay (HOSPITAL_COMMUNITY): Payer: BC Managed Care – PPO

## 2010-10-06 DIAGNOSIS — M7989 Other specified soft tissue disorders: Secondary | ICD-10-CM

## 2010-10-06 LAB — GLUCOSE, CAPILLARY
Glucose-Capillary: 142 mg/dL — ABNORMAL HIGH (ref 70–99)
Glucose-Capillary: 108 mg/dL — ABNORMAL HIGH (ref 70–99)

## 2010-10-06 LAB — BASIC METABOLIC PANEL
CO2: 20 mEq/L (ref 19–32)
Calcium: 8.4 mg/dL (ref 8.4–10.5)
Creatinine, Ser: 1.58 mg/dL — ABNORMAL HIGH (ref 0.4–1.2)
GFR calc Af Amer: 40 mL/min — ABNORMAL LOW (ref >60)

## 2010-10-06 LAB — CBC
Hemoglobin: 8.5 g/dL — ABNORMAL LOW (ref 12.0–15.0)
MCH: 26.5 pg (ref 26.0–34.0)
MCHC: 31 g/dL (ref 30.0–36.0)

## 2010-10-07 LAB — GLUCOSE, CAPILLARY
Glucose-Capillary: 148 mg/dL — ABNORMAL HIGH (ref 70–99)
Glucose-Capillary: 183 mg/dL — ABNORMAL HIGH (ref 70–99)

## 2010-10-07 LAB — CBC
HCT: 26.8 % — ABNORMAL LOW (ref 36.0–46.0)
Hemoglobin: 7.9 g/dL — ABNORMAL LOW (ref 12.0–15.0)
MCH: 25.2 pg — ABNORMAL LOW (ref 26.0–34.0)
MCHC: 29.5 g/dL — ABNORMAL LOW (ref 30.0–36.0)
RBC: 3.14 MIL/uL — ABNORMAL LOW (ref 3.87–5.11)

## 2010-10-08 ENCOUNTER — Inpatient Hospital Stay (HOSPITAL_COMMUNITY): Payer: BC Managed Care – PPO

## 2010-10-08 LAB — BASIC METABOLIC PANEL
BUN: 12 mg/dL (ref 6–23)
BUN: 12 mg/dL (ref 6–23)
CO2: 21 mEq/L (ref 19–32)
Calcium: 8.4 mg/dL (ref 8.4–10.5)
Calcium: 8.7 mg/dL (ref 8.4–10.5)
Chloride: 113 mEq/L — ABNORMAL HIGH (ref 96–112)
Chloride: 114 mEq/L — ABNORMAL HIGH (ref 96–112)
Creatinine, Ser: 1.47 mg/dL — ABNORMAL HIGH (ref 0.4–1.2)
GFR calc non Af Amer: 37 mL/min — ABNORMAL LOW (ref >60)
Glucose, Bld: 138 mg/dL — ABNORMAL HIGH (ref 70–99)
Potassium: 3.4 mEq/L — ABNORMAL LOW (ref 3.5–5.1)
Potassium: 3.9 mEq/L (ref 3.5–5.1)

## 2010-10-08 LAB — GLUCOSE, CAPILLARY
Glucose-Capillary: 153 mg/dL — ABNORMAL HIGH (ref 70–99)
Glucose-Capillary: 178 mg/dL — ABNORMAL HIGH (ref 70–99)
Glucose-Capillary: 250 mg/dL — ABNORMAL HIGH (ref 70–99)
Glucose-Capillary: 306 mg/dL — ABNORMAL HIGH (ref 70–99)
Glucose-Capillary: 263 mg/dL — ABNORMAL HIGH (ref 70–99)

## 2010-10-08 LAB — CBC
HCT: 28.5 % — ABNORMAL LOW (ref 36.0–46.0)
MCH: 25.4 pg — ABNORMAL LOW (ref 26.0–34.0)
MCV: 85.1 fL (ref 78.0–100.0)
RBC: 3.35 MIL/uL — ABNORMAL LOW (ref 3.87–5.11)
WBC: 22.1 10*3/uL — ABNORMAL HIGH (ref 4.0–10.5)

## 2010-10-08 NOTE — Progress Notes (Signed)
Summary: call and get pulse and BP readings  Phone Note Outgoing Call   Call placed by: Katina Dung, RN, BSN,  September 28, 2010 8:04 AM Call placed to: Patient Summary of Call: Coreg started 09/24/10  Follow-up for Phone Call        Coreg 6.25mg  bid started 09/24/10--call and get pulse and BP---LMTCB [phone]--pt not at [phone] today Katina Dung, RN, BSN  September 28, 2010 8:06 AM  [facility] Katina Dung, RN, BSN  September 28, 2010 12:14 PM   [facility] Katina Dung, RN, BSN  September 28, 2010 6:08 PM --I have been unable to reach pt -not at home or work-Anne Lankford,RN --pt at  [facility] done by Dr Abbey Chatters today 09/29/10--Dr Shirlee Latch is aware

## 2010-10-08 NOTE — Progress Notes (Signed)
Summary: dr Abbey Chatters phoned to advise pt is at [facility] room 1535  Phone Note From Other Clinic   Caller: DR. Abbey Chatters Call For: DR Keyoni Lapinski Summary of Call: Dr Abbey Chatters phoned wanted to let Dr Kriste Basque that Ms Eischen had her surgery today at  [facility] she is in room 1535 Initial call taken by: Vedia Coffer,  September 29, 2010 3:03 PM  Follow-up for Phone Call        Will forward to SN as Mitzi Davenport  September 29, 2010 3:47 PM   given to [facility] ---SN is aware Randell Loop CMA  September 30, 2010 8:31 AM

## 2010-10-09 LAB — VANCOMYCIN, TROUGH: Vancomycin Tr: 31.1 ug/mL (ref 10.0–20.0)

## 2010-10-09 LAB — BASIC METABOLIC PANEL
Calcium: 8.7 mg/dL (ref 8.4–10.5)
Creatinine, Ser: 1.36 mg/dL — ABNORMAL HIGH (ref 0.4–1.2)
GFR calc Af Amer: 48 mL/min — ABNORMAL LOW (ref >60)
GFR calc non Af Amer: 39 mL/min — ABNORMAL LOW (ref >60)
Glucose, Bld: 246 mg/dL — ABNORMAL HIGH (ref 70–99)
Sodium: 139 mEq/L (ref 135–145)

## 2010-10-09 LAB — CBC
MCHC: 30.6 g/dL (ref 30.0–36.0)
Platelets: 1008 10*3/uL (ref 150–400)
RDW: 15.3 % (ref 11.5–15.5)
WBC: 22.8 10*3/uL — ABNORMAL HIGH (ref 4.0–10.5)

## 2010-10-09 LAB — GLUCOSE, CAPILLARY
Glucose-Capillary: 267 mg/dL — ABNORMAL HIGH (ref 70–99)
Glucose-Capillary: 312 mg/dL — ABNORMAL HIGH (ref 70–99)

## 2010-10-09 LAB — BODY FLUID CULTURE

## 2010-10-10 LAB — CULTURE, BLOOD (ROUTINE X 2)
Culture  Setup Time: 201203040035
Culture: NO GROWTH

## 2010-10-10 LAB — ANAEROBIC CULTURE: Gram Stain: NONE SEEN

## 2010-10-10 LAB — CBC
HCT: 28.3 % — ABNORMAL LOW (ref 36.0–46.0)
MCHC: 30.4 g/dL (ref 30.0–36.0)
MCV: 84.7 fL (ref 78.0–100.0)
Platelets: 1059 10*3/uL (ref 150–400)
RDW: 15.5 % (ref 11.5–15.5)

## 2010-10-10 LAB — GLUCOSE, CAPILLARY: Glucose-Capillary: 284 mg/dL — ABNORMAL HIGH (ref 70–99)

## 2010-10-10 LAB — BASIC METABOLIC PANEL
BUN: 15 mg/dL (ref 6–23)
Chloride: 110 mEq/L (ref 96–112)
Creatinine, Ser: 1.5 mg/dL — ABNORMAL HIGH (ref 0.4–1.2)
Glucose, Bld: 272 mg/dL — ABNORMAL HIGH (ref 70–99)
Potassium: 4.8 mEq/L (ref 3.5–5.1)

## 2010-10-10 NOTE — Discharge Summary (Signed)
NAMECAYLEI, [name]            ACCOUNT NO.:  [id number]  MEDICAL RECORD NO.:  [id number]           PATIENT TYPE:  I  LOCATION:  1235                         FACILITY:  [facility]  PHYSICIAN:  Adolph Pollack, M.D.DATE OF BIRTH:  1945/08/09  DATE OF ADMISSION:  09/29/2010 DATE OF DISCHARGE:                              DISCHARGE SUMMARY   PRINCIPAL DIAGNOSIS:  Sarcoidosis.  SECONDARY DIAGNOSES: 1. Postoperative pancreatic leak. 2. Left lower lobe pneumonia. 3. Acute-on-chronic anemia. 4. Chronic renal insufficiency. 5. Hypercalcemia (resolved). 6. Obstructive sleep apnea. 7. Chronic bronchitis. 8. Hypertension. 9. Coronary artery disease. 10.Cerebrovascular disease. 11.Hyperlipidemia. 12.Type 2 diabetes mellitus. 13.Obesity. 14.Gastroesophageal reflux disease. 15.Degenerative joint disease. 16.Deconditioned state  PROCEDURE:  Open splenectomy, September 21, 2010.  REASON FOR ADMISSION:  This is a 65 year old female who has having some left upper quadrant pain and also renal insufficiency.  Ultrasound demonstrated an abnormal spleen.  An MRI confirmed multiple lesions in the spleen.  She also has severe hypercalcemia and been admitted 1 time in the hospital for that.  A PET scan demonstrated significant hypermetabolic activity in the spleen as well as some lymph nodes in the left inguinal area.  Bone marrow was not diagnostic for lymphoma but there was some concern that diagnosis could be lymphoma or sarcoidosis and she is admitted for splenectomy.  HOSPITAL COURSE:  The patient was admitted for splenectomy.  The first and second postoperative days, she was somewhat sedated secondary to the PCA which was stopped and she had a fever spike and increased drain output from a Jackson-Pratt drain.  Chest x-ray was consistent with a left lower lobe pneumonia.  She was started on broad-spectrum empiric antibiotic and she had leukocytosis post splenectomy as high as  32,000. Calcium, however, normalized.  Drain amylase was elevated consistent with a pancreatic leak; the hilum of her spleen had been adherent to the tail of pancreas.  She had some acute blood loss anemia with hemoglobin of 6.9 and was given 2 units of PRBCs.  She continued to have some fevers.  White count slowly went down.  Her serum amylase and lipase within normal limits.  Acute-on-chronic renal insufficiency did improve.  Her drain output was minimal and a CT scan was performed demonstrating a residual fluid collection that was not drained well by the drain.  Subsequently, radiologic-guided left subphrenic drain was placed with good return of fluid.  Amylase was 41,784 and cultures negative.  She was changed over to vancomycin and Zosyn for hospital- acquired pneumonia.  She is extremely deconditioned and PT/OT are working with her.  She had extreme foot pain and could not bear weight on the left foot and Dr. Kriste Basque started her on some prednisone which caused her to have hyperglycemia but allowed her to weightbear.  As of today, October 09, 2010, she is on a solid carbohydrate modified diet.  PT and OT are working with her.  She remains on vancomycin and Zosyn for her left lower lobe pneumonia which was seen again yesterday on a chest x-ray.  The drainage from the pancreatic leak is decreasing.  We will transfer her to the floor today.  Discontinue  her Foley.  Continue PT/OT and antibiotics.     Adolph Pollack, M.D.     Kari Baars  D:  10/09/2010  T:  10/09/2010  Job:  [id number]  Electronically Signed by Avel Peace M.D. on 10/10/2010 09:40:36 PM

## 2010-10-11 LAB — GLUCOSE, CAPILLARY: Glucose-Capillary: 174 mg/dL — ABNORMAL HIGH (ref 70–99)

## 2010-10-12 ENCOUNTER — Inpatient Hospital Stay (HOSPITAL_COMMUNITY): Payer: BC Managed Care – PPO

## 2010-10-12 DIAGNOSIS — D7389 Other diseases of spleen: Secondary | ICD-10-CM

## 2010-10-12 DIAGNOSIS — J90 Pleural effusion, not elsewhere classified: Secondary | ICD-10-CM

## 2010-10-12 DIAGNOSIS — J9819 Other pulmonary collapse: Secondary | ICD-10-CM

## 2010-10-12 DIAGNOSIS — D869 Sarcoidosis, unspecified: Secondary | ICD-10-CM

## 2010-10-12 LAB — COMPREHENSIVE METABOLIC PANEL
Albumin: 3.3 g/dL — ABNORMAL LOW (ref 3.5–5.2)
Alkaline Phosphatase: 84 U/L (ref 39–117)
BUN: 33 mg/dL — ABNORMAL HIGH (ref 6–23)
GFR calc Af Amer: 26 mL/min — ABNORMAL LOW (ref >60)
Potassium: 4.7 mEq/L (ref 3.5–5.1)
Total Protein: 7 g/dL (ref 6.0–8.3)

## 2010-10-12 LAB — GLUCOSE, CAPILLARY
Glucose-Capillary: 109 mg/dL — ABNORMAL HIGH (ref 70–99)
Glucose-Capillary: 131 mg/dL — ABNORMAL HIGH (ref 70–99)
Glucose-Capillary: 151 mg/dL — ABNORMAL HIGH (ref 70–99)

## 2010-10-12 LAB — DIFFERENTIAL
Basophils Relative: 0 % (ref 0–1)
Lymphocytes Relative: 18 % (ref 12–46)
Monocytes Absolute: 0.6 10*3/uL (ref 0.1–1.0)
Monocytes Relative: 14 % — ABNORMAL HIGH (ref 3–12)
Neutro Abs: 2.7 10*3/uL (ref 1.7–7.7)

## 2010-10-12 LAB — SURGICAL PCR SCREEN
MRSA, PCR: NEGATIVE
Staphylococcus aureus: NEGATIVE

## 2010-10-12 LAB — CBC
MCV: 85.1 fL (ref 78.0–100.0)
Platelets: 221 10*3/uL (ref 150–400)
RDW: 13.8 % (ref 11.5–15.5)
WBC: 4 10*3/uL (ref 4.0–10.5)

## 2010-10-12 LAB — APTT: aPTT: 32 seconds (ref 24–37)

## 2010-10-13 ENCOUNTER — Inpatient Hospital Stay (HOSPITAL_COMMUNITY): Payer: BC Managed Care – PPO

## 2010-10-13 ENCOUNTER — Other Ambulatory Visit: Payer: Self-pay | Admitting: Diagnostic Radiology

## 2010-10-13 LAB — BODY FLUID CELL COUNT WITH DIFFERENTIAL: Neutrophil Count, Fluid: 19 % (ref 0–25)

## 2010-10-13 LAB — DIFFERENTIAL
Basophils Absolute: 0 10*3/uL (ref 0.0–0.1)
Eosinophils Absolute: 0.9 10*3/uL — ABNORMAL HIGH (ref 0.0–0.7)
Lymphocytes Relative: 5 % — ABNORMAL LOW (ref 12–46)
Neutro Abs: 18.4 10*3/uL — ABNORMAL HIGH (ref 1.7–7.7)

## 2010-10-13 LAB — COMPREHENSIVE METABOLIC PANEL
Albumin: 2.2 g/dL — ABNORMAL LOW (ref 3.5–5.2)
BUN: 16 mg/dL (ref 6–23)
Calcium: 8.8 mg/dL (ref 8.4–10.5)
Chloride: 108 mEq/L (ref 96–112)
Creatinine, Ser: 1.38 mg/dL — ABNORMAL HIGH (ref 0.4–1.2)
Total Bilirubin: 0.4 mg/dL (ref 0.3–1.2)
Total Protein: 5.4 g/dL — ABNORMAL LOW (ref 6.0–8.3)

## 2010-10-13 LAB — GLUCOSE, CAPILLARY
Glucose-Capillary: 114 mg/dL — ABNORMAL HIGH (ref 70–99)
Glucose-Capillary: 68 mg/dL — ABNORMAL LOW (ref 70–99)
Glucose-Capillary: 129 mg/dL — ABNORMAL HIGH (ref 70–99)

## 2010-10-13 LAB — AMYLASE, BODY FLUID: Amylase, Fluid: 26 U/L

## 2010-10-13 LAB — PROTEIN, BODY FLUID: Total protein, fluid: <3 g/dL

## 2010-10-13 LAB — CBC
Hemoglobin: 9 g/dL — ABNORMAL LOW (ref 12.0–15.0)
MCHC: 30.3 g/dL (ref 30.0–36.0)
RDW: 16.2 % — ABNORMAL HIGH (ref 11.5–15.5)

## 2010-10-14 LAB — DIFFERENTIAL
Eosinophils Absolute: 0.2 10*3/uL (ref 0.0–0.7)
Eosinophils Relative: 5 % (ref 0–5)
Lymphocytes Relative: 17 % (ref 12–46)
Lymphs Abs: 0.7 10*3/uL (ref 0.7–4.0)
Monocytes Relative: 12 % (ref 3–12)

## 2010-10-14 LAB — GLUCOSE, CAPILLARY
Glucose-Capillary: 118 mg/dL — ABNORMAL HIGH (ref 70–99)
Glucose-Capillary: 123 mg/dL — ABNORMAL HIGH (ref 70–99)
Glucose-Capillary: 127 mg/dL — ABNORMAL HIGH (ref 70–99)
Glucose-Capillary: 129 mg/dL — ABNORMAL HIGH (ref 70–99)
Glucose-Capillary: 129 mg/dL — ABNORMAL HIGH (ref 70–99)
Glucose-Capillary: 136 mg/dL — ABNORMAL HIGH (ref 70–99)
Glucose-Capillary: 138 mg/dL — ABNORMAL HIGH (ref 70–99)
Glucose-Capillary: 141 mg/dL — ABNORMAL HIGH (ref 70–99)
Glucose-Capillary: 142 mg/dL — ABNORMAL HIGH (ref 70–99)
Glucose-Capillary: 156 mg/dL — ABNORMAL HIGH (ref 70–99)
Glucose-Capillary: 195 mg/dL — ABNORMAL HIGH (ref 70–99)
Glucose-Capillary: 239 mg/dL — ABNORMAL HIGH (ref 70–99)
Glucose-Capillary: 258 mg/dL — ABNORMAL HIGH (ref 70–99)
Glucose-Capillary: 86 mg/dL (ref 70–99)
Glucose-Capillary: 86 mg/dL (ref 70–99)
Glucose-Capillary: 94 mg/dL (ref 70–99)

## 2010-10-14 LAB — COMPREHENSIVE METABOLIC PANEL
ALT: 15 U/L (ref 0–35)
AST: 15 U/L (ref 0–37)
AST: 18 U/L (ref 0–37)
Albumin: 3.2 g/dL — ABNORMAL LOW (ref 3.5–5.2)
Albumin: 3.3 g/dL — ABNORMAL LOW (ref 3.5–5.2)
Calcium: 12 mg/dL — ABNORMAL HIGH (ref 8.4–10.5)
Calcium: 12.8 mg/dL — ABNORMAL HIGH (ref 8.4–10.5)
Creatinine, Ser: 2 mg/dL — ABNORMAL HIGH (ref 0.4–1.2)
Creatinine, Ser: 2.12 mg/dL — ABNORMAL HIGH (ref 0.4–1.2)
GFR calc Af Amer: 29 mL/min — ABNORMAL LOW (ref >60)
GFR calc Af Amer: 31 mL/min — ABNORMAL LOW (ref >60)
GFR calc non Af Amer: 25 mL/min — ABNORMAL LOW (ref >60)
Sodium: 139 mEq/L (ref 135–145)
Total Protein: 6.5 g/dL (ref 6.0–8.3)

## 2010-10-14 LAB — BASIC METABOLIC PANEL
BUN: 29 mg/dL — ABNORMAL HIGH (ref 6–23)
BUN: 31 mg/dL — ABNORMAL HIGH (ref 6–23)
BUN: 33 mg/dL — ABNORMAL HIGH (ref 6–23)
CO2: 27 mEq/L (ref 19–32)
CO2: 28 mEq/L (ref 19–32)
CO2: 28 mEq/L (ref 19–32)
Calcium: 11.2 mg/dL — ABNORMAL HIGH (ref 8.4–10.5)
Chloride: 102 mEq/L (ref 96–112)
Chloride: 103 mEq/L (ref 96–112)
Chloride: 104 mEq/L (ref 96–112)
Chloride: 105 mEq/L (ref 96–112)
Creatinine, Ser: 1.92 mg/dL — ABNORMAL HIGH (ref 0.4–1.2)
Creatinine, Ser: 2.01 mg/dL — ABNORMAL HIGH (ref 0.4–1.2)
GFR calc Af Amer: 28 mL/min — ABNORMAL LOW (ref >60)
GFR calc Af Amer: 32 mL/min — ABNORMAL LOW (ref >60)
GFR calc Af Amer: 35 mL/min — ABNORMAL LOW (ref >60)
GFR calc non Af Amer: 29 mL/min — ABNORMAL LOW (ref >60)
Glucose, Bld: 128 mg/dL — ABNORMAL HIGH (ref 70–99)
Glucose, Bld: 90 mg/dL (ref 70–99)
Glucose, Bld: 96 mg/dL (ref 70–99)
Potassium: 3.7 mEq/L (ref 3.5–5.1)
Potassium: 3.8 mEq/L (ref 3.5–5.1)
Potassium: 4.1 mEq/L (ref 3.5–5.1)
Sodium: 137 mEq/L (ref 135–145)
Sodium: 139 mEq/L (ref 135–145)
Sodium: 141 mEq/L (ref 135–145)

## 2010-10-14 LAB — HIV ANTIBODY (ROUTINE TESTING W REFLEX): HIV: NONREACTIVE

## 2010-10-14 LAB — CBC
HCT: 29.1 % — ABNORMAL LOW (ref 36.0–46.0)
HCT: 30.4 % — ABNORMAL LOW (ref 36.0–46.0)
Hemoglobin: 10.1 g/dL — ABNORMAL LOW (ref 12.0–15.0)
Hemoglobin: 9.6 g/dL — ABNORMAL LOW (ref 12.0–15.0)
MCH: 27.9 pg (ref 26.0–34.0)
MCH: 28.1 pg (ref 26.0–34.0)
MCHC: 32.8 g/dL (ref 30.0–36.0)
MCHC: 33 g/dL (ref 30.0–36.0)
MCV: 84.7 fL (ref 78.0–100.0)
Platelets: 218 10*3/uL (ref 150–400)
RDW: 13.4 % (ref 11.5–15.5)
RDW: 13.5 % (ref 11.5–15.5)
RDW: 13.5 % (ref 11.5–15.5)
WBC: 4.3 10*3/uL (ref 4.0–10.5)

## 2010-10-14 LAB — VITAMIN D 1,25 DIHYDROXY
Vitamin D 1, 25 (OH)2 Total: 152 pg/mL — ABNORMAL HIGH (ref 18–72)
Vitamin D2 1, 25 (OH)2: 144 pg/mL

## 2010-10-14 LAB — TISSUE HYBRIDIZATION (BONE MARROW)-NCBH

## 2010-10-14 LAB — BONE MARROW EXAM

## 2010-10-14 LAB — TSH: TSH: 3.225 u[IU]/mL (ref 0.350–4.500)

## 2010-10-14 LAB — PTH-RELATED PEPTIDE

## 2010-10-15 ENCOUNTER — Inpatient Hospital Stay (HOSPITAL_COMMUNITY): Payer: BC Managed Care – PPO

## 2010-10-15 LAB — DIFFERENTIAL
Basophils Relative: 0 % (ref 0–1)
Eosinophils Absolute: 0.7 10*3/uL (ref 0.0–0.7)
Lymphs Abs: 0.9 10*3/uL (ref 0.7–4.0)
Monocytes Absolute: 1.6 10*3/uL — ABNORMAL HIGH (ref 0.1–1.0)
Neutrophils Relative %: 74 % (ref 43–77)
Smear Review: INCREASED

## 2010-10-15 LAB — GLUCOSE, CAPILLARY: Glucose-Capillary: 90 mg/dL (ref 70–99)

## 2010-10-15 LAB — BASIC METABOLIC PANEL
CO2: 24 mEq/L (ref 19–32)
Chloride: 109 mEq/L (ref 96–112)
Creatinine, Ser: 1.63 mg/dL — ABNORMAL HIGH (ref 0.4–1.2)
GFR calc Af Amer: 39 mL/min — ABNORMAL LOW (ref >60)
Potassium: 4.8 mEq/L (ref 3.5–5.1)
Sodium: 137 mEq/L (ref 135–145)

## 2010-10-15 LAB — CBC
MCH: 26.2 pg (ref 26.0–34.0)
Platelets: 727 10*3/uL — ABNORMAL HIGH (ref 150–400)
RBC: 3.13 MIL/uL — ABNORMAL LOW (ref 3.87–5.11)
RDW: 16.6 % — ABNORMAL HIGH (ref 11.5–15.5)
WBC: 12.2 10*3/uL — ABNORMAL HIGH (ref 4.0–10.5)

## 2010-10-16 LAB — GLUCOSE, CAPILLARY
Glucose-Capillary: 112 mg/dL — ABNORMAL HIGH (ref 70–99)
Glucose-Capillary: 90 mg/dL (ref 70–99)
Glucose-Capillary: 95 mg/dL (ref 70–99)
Glucose-Capillary: 83 mg/dL (ref 70–99)

## 2010-10-17 LAB — BODY FLUID CULTURE: Gram Stain: NONE SEEN

## 2010-10-19 LAB — GLUCOSE, CAPILLARY
Glucose-Capillary: 143 mg/dL — ABNORMAL HIGH (ref 70–99)
Glucose-Capillary: 181 mg/dL — ABNORMAL HIGH (ref 70–99)

## 2010-10-20 ENCOUNTER — Telehealth: Payer: Self-pay | Admitting: Pulmonary Disease

## 2010-10-20 DIAGNOSIS — E119 Type 2 diabetes mellitus without complications: Secondary | ICD-10-CM

## 2010-10-20 MED ORDER — INSULIN DETEMIR 100 UNIT/ML ~~LOC~~ SOLN: 40.0000 [IU] | Freq: Every day | SUBCUTANEOUS | Status: DC

## 2010-10-20 NOTE — Telephone Encounter (Signed)
Spoke with patients sister-aware that we have sent Rx to [facility] on [address]

## 2010-10-20 NOTE — Telephone Encounter (Signed)
This has been done already-see phone note prior.

## 2010-10-21 ENCOUNTER — Telehealth: Payer: Self-pay | Admitting: Pulmonary Disease

## 2010-10-21 ENCOUNTER — Ambulatory Visit
Admission: RE | Admit: 2010-10-21 | Discharge: 2010-10-21 | Disposition: A | Discharge status: Home / Self Care | Payer: BC Managed Care – PPO | Source: Ambulatory Visit | Attending: General Surgery | Admitting: General Surgery

## 2010-10-21 ENCOUNTER — Other Ambulatory Visit: Payer: Self-pay | Admitting: General Surgery

## 2010-10-21 DIAGNOSIS — R05 Cough: Secondary | ICD-10-CM

## 2010-10-21 DIAGNOSIS — R509 Fever, unspecified: Secondary | ICD-10-CM

## 2010-10-21 DIAGNOSIS — E119 Type 2 diabetes mellitus without complications: Secondary | ICD-10-CM

## 2010-10-21 MED ORDER — ESOMEPRAZOLE MAGNESIUM 40 MG PO CPDR: 40.0000 mg | DELAYED_RELEASE_CAPSULE | Freq: Two times a day (BID) | ORAL | Status: DC

## 2010-10-21 MED ORDER — INSULIN DETEMIR 100 UNIT/ML ~~LOC~~ SOLN: 40.0000 [IU] | Freq: Every day | SUBCUTANEOUS | Status: DC

## 2010-10-21 NOTE — Telephone Encounter (Signed)
Spoke with Leigh; pt can have Rx for nexium with 3 refills and also ok to send refill again for Levimir- Pt will need OV with SN for more refills.  Spoke with Drinda Butts and she is aware of Rx's sent and transferred to front to make OV with SN.

## 2010-10-27 ENCOUNTER — Encounter: Payer: Self-pay | Admitting: *Deleted

## 2010-10-31 ENCOUNTER — Other Ambulatory Visit: Payer: Self-pay | Admitting: Pulmonary Disease

## 2010-11-03 ENCOUNTER — Encounter: Payer: Self-pay | Admitting: Pulmonary Disease

## 2010-11-05 NOTE — Discharge Summary (Signed)
[name], [name]            ACCOUNT NO.:  [id number]  MEDICAL RECORD NO.:  [id number]           PATIENT TYPE:  I  LOCATION:  1532                         FACILITY:  [facility]  PHYSICIAN:  [name] Sella. Andrey Campanile, MD     DATE OF BIRTH:  10-07-1945  DATE OF ADMISSION:  09/29/2010 DATE OF DISCHARGE:                              DISCHARGE SUMMARY   DISCHARGING PHYSICIAN:  [name] Sella. Andrey Campanile, MD  ADMITTING DIAGNOSES: 1. Chronic renal insufficiency. 2. Obstructive sleep apnea. 3. Chronic bronchitis. 4. Hypertension. 5. Coronary artery disease. 6. Cerebrovascular disease. 7. Hyperlipidemia. 8. Type 2 diabetes mellitus. 9. Obesity. 10.Gastroesophageal reflux disease. 11.Degenerative joint disease. 12.Psoriasis.  DISCHARGE DIAGNOSES: 1. Left lower lobe pneumonia. 2. Acute-on-chronic anemia. 3. Postoperative pancreatic leak. 4. Sarcoidosis. 5. Hypercalcemia, resolved. 6. Obstructive sleep apnea. 7. Chronic bronchitis. 8. Hypertension. 9. Coronary artery disease. 10.Cerebrovascular disease. 11.Hyperlipidemia. 12.Type 2 diabetes mellitus. 13.Obesity. 14.Gastroesophageal reflux disease. 15.Degenerative joint disease. 16.Deconditioned state.  PROCEDURES DURING HOSPITALIZATION: 1. Open splenectomy September 21, 2010. 2. CT chest, abdomen and pelvis October 04, 2010. 3. CT-guided drainage of left subphrenic abscess, October 05, 2010. 4. Ultrasound-guided left thoracentesis, October 13, 2010.  BRIEF HOSPITAL COURSE:  Please refer to Dr. Maris [name] previously dictated mid hospitalization discharge summary dictated on March 9 regarding her immediate postoperative course.  Since he dictated that discharge summary on March 9, she has continued to recover.  Her primary issues going on from March 9 have been her hypertension, her diabetes mellitus, her left pleural effusion and ongoing pancreatic leak.  She had an operative drain placed as well as a percutaneous drain placed by radiology for  management of her pancreatic leak.  Her pancreatic leak slowly decreased.  However, she was still draining about 5 to 20 mL per day.  She had also been started on broad-spectrum antibiotics, consisting of vancomycin and Zosyn for hospital-acquired pneumonia which were stopped this past Monday.  She has been tolerating a diabetic diet without problem and having bowel movements.  Her wound has been clean, dry and intact.  However, she has had a persistent effusion on her serial chest x-rays.  Therefore, she underwent a thoracentesis on March 13.  So far, no organisms have grown from aspiration of her left pleural effusion.  Her blood cultures have remained negative today.  The cultures from the percutaneous drain placed in her left subphrenic area have also remained negative to date.  On March 16, she was deemed stable for discharge.  Her vital signs were stable.  Her blood sugars were well controlled.  Her incision was clean, dry and intact.  She had been taught drain care.  She was felt not to be a candidate for rehab or inpatient rehab.  She is having bowel movements.  She was ambulating without difficulty.  DISCHARGE PLAN:  She is going to follow with Dr. Abbey [name] next week for consideration of drain removal.  She is going to follow up with Dr. Jairo [name] office next week for generalized medical checkup regarding her comorbidities.  DISCHARGE INSTRUCTIONS:  She is instructed to take a diabetic diet and to take her medicines as prescribed.  She is  instructed not to do any heavy lifting greater than 20 pounds for another 3 weeks.  She is not to drive as long as she is taking any narcotics for pain.  She is instructed to call for fever greater than 101.5, persistent nausea, vomiting, worsening abdominal pain, any change in the character of her drain output.  She is also to call if there are any problems with the drain.  She is instructed to keep a drain diary as well.  DISCHARGE  MEDICATIONS:  Please see discharge medication manager by Dr. Alroy [name].     [name] Sella. Andrey Campanile, MD     EMW/MEDQ  D:  10/16/2010  T:  10/16/2010  Job:  [id number]  Electronically Signed by [name] M.D. on 11/05/2010 07:36:35 AM

## 2010-11-10 ENCOUNTER — Other Ambulatory Visit: Payer: Self-pay | Admitting: Pulmonary Disease

## 2010-11-18 ENCOUNTER — Other Ambulatory Visit: Payer: Self-pay | Admitting: *Deleted

## 2010-11-18 MED ORDER — GLIMEPIRIDE 4 MG PO TABS: 4.0000 mg | ORAL_TABLET | Freq: Every day | ORAL | Status: DC

## 2010-11-19 ENCOUNTER — Other Ambulatory Visit: Payer: Self-pay | Admitting: Pulmonary Disease

## 2010-11-20 ENCOUNTER — Encounter: Payer: Self-pay | Admitting: Pulmonary Disease

## 2010-11-24 ENCOUNTER — Ambulatory Visit (INDEPENDENT_AMBULATORY_CARE_PROVIDER_SITE_OTHER)
Admission: RE | Admit: 2010-11-24 | Discharge: 2010-11-24 | Disposition: A | Discharge status: Home / Self Care | Payer: BC Managed Care – PPO | Source: Ambulatory Visit | Attending: Pulmonary Disease | Admitting: Pulmonary Disease

## 2010-11-24 ENCOUNTER — Ambulatory Visit (INDEPENDENT_AMBULATORY_CARE_PROVIDER_SITE_OTHER): Payer: BC Managed Care – PPO | Admitting: Pulmonary Disease

## 2010-11-24 ENCOUNTER — Other Ambulatory Visit (INDEPENDENT_AMBULATORY_CARE_PROVIDER_SITE_OTHER): Payer: BC Managed Care – PPO

## 2010-11-24 ENCOUNTER — Encounter: Payer: Self-pay | Admitting: Pulmonary Disease

## 2010-11-24 VITALS — BP 132/62 | HR 66 | Temp 96.2°F | Ht 63.0 in | Wt 168.0 lb

## 2010-11-24 DIAGNOSIS — D472 Monoclonal gammopathy: Secondary | ICD-10-CM

## 2010-11-24 DIAGNOSIS — F411 Generalized anxiety disorder: Secondary | ICD-10-CM

## 2010-11-24 DIAGNOSIS — J99 Respiratory disorders in diseases classified elsewhere: Secondary | ICD-10-CM

## 2010-11-24 DIAGNOSIS — E119 Type 2 diabetes mellitus without complications: Secondary | ICD-10-CM

## 2010-11-24 DIAGNOSIS — D869 Sarcoidosis, unspecified: Secondary | ICD-10-CM

## 2010-11-24 DIAGNOSIS — D649 Anemia, unspecified: Secondary | ICD-10-CM

## 2010-11-24 DIAGNOSIS — G4733 Obstructive sleep apnea (adult) (pediatric): Secondary | ICD-10-CM

## 2010-11-24 DIAGNOSIS — E785 Hyperlipidemia, unspecified: Secondary | ICD-10-CM

## 2010-11-24 DIAGNOSIS — I2510 Atherosclerotic heart disease of native coronary artery without angina pectoris: Secondary | ICD-10-CM

## 2010-11-24 DIAGNOSIS — N259 Disorder resulting from impaired renal tubular function, unspecified: Secondary | ICD-10-CM

## 2010-11-24 DIAGNOSIS — I10 Essential (primary) hypertension: Secondary | ICD-10-CM

## 2010-11-24 DIAGNOSIS — D860 Sarcoidosis of lung: Secondary | ICD-10-CM

## 2010-11-24 LAB — CBC WITH DIFFERENTIAL/PLATELET
Eosinophils Absolute: 0.7 10*3/uL (ref 0.0–0.7)
Eosinophils Relative: 7.9 % — ABNORMAL HIGH (ref 0.0–5.0)
Lymphocytes Relative: 28 % (ref 12.0–46.0)
MCHC: 32.5 g/dL (ref 30.0–36.0)
MCV: 84.4 fl (ref 78.0–100.0)
Monocytes Absolute: 1.4 10*3/uL — ABNORMAL HIGH (ref 0.1–1.0)
Neutrophils Relative %: 48.9 % (ref 43.0–77.0)
Platelets: 443 10*3/uL — ABNORMAL HIGH (ref 150.0–400.0)
RBC: 4.23 Mil/uL (ref 3.87–5.11)
WBC: 9.4 10*3/uL (ref 4.5–10.5)

## 2010-11-24 LAB — HEPATIC FUNCTION PANEL
AST: 20 U/L (ref 0–37)
Albumin: 3.6 g/dL (ref 3.5–5.2)
Alkaline Phosphatase: 152 U/L — ABNORMAL HIGH (ref 39–117)
Total Protein: 6.7 g/dL (ref 6.0–8.3)

## 2010-11-24 LAB — TSH: TSH: 2.77 u[IU]/mL (ref 0.35–5.50)

## 2010-11-24 LAB — LIPID PANEL
Cholesterol: 146 mg/dL (ref 0–200)
HDL: 35.3 mg/dL — ABNORMAL LOW (ref >39.00)
Triglycerides: 115 mg/dL (ref 0.0–149.0)

## 2010-11-24 LAB — BASIC METABOLIC PANEL
BUN: 27 mg/dL — ABNORMAL HIGH (ref 6–23)
Calcium: 11.1 mg/dL — ABNORMAL HIGH (ref 8.4–10.5)
Chloride: 102 mEq/L (ref 96–112)
Creatinine, Ser: 1.6 mg/dL — ABNORMAL HIGH (ref 0.4–1.2)

## 2010-11-24 NOTE — Progress Notes (Signed)
Subjective:    Patient ID: [name], female    DOB: 05-22-1946, 65 y.o.   MRN: [id number]  HPI 65 y/o BF here for a follow up visit... he has multiple medical problems as noted below...    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 MRI of the Abd showed splenomegaly & numerous ~1cm lesions throughout ?etiology...  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 she was also seen by DrFox for Nephrology & he concurred w/ the renal insuffic eval but on recheck of her labs- Calcium was 13.7 (?etiology- review of all prev labs showed normal calcium levels in the 9-10 range, last checked 04/28/10= 10.1) & he sent her to [facility] where she was adm by the Hospitalists...  extensive eval in the hosp by TH & DrGranfortuna failed to confirm an etiology for the Hypercalcemia (Ca= 12.8 --> 11.2 at disch after Rx)- the consensus was poss Sarcoid given the splenic lesions & no other explanation (ACE was not checked) & she was started on Pred20mg /d & disch home... DrG did bone marrow & final path was neg- no pathologic diagnosis in bone marrow...  Since disch she is confused by all this & feeling about the same> now w/ predom GI manifestations of abd discomfort, nausea, +reflux, no vomiting, constip, weakness, etc... we discussed incr Nexium to 40mg Bid & ask DrPerry to review her situation & his thoughts re her splenic abn as well... f/u LABS TODAY> BS=368;  BUN=32, Creat=2.2;  Ca=12.6;  Hg=10.5, ACE=63 (8-52)...  ~   November 24, 2010:  [name], no evid malignancy;  Post-op her calcium level returned to normal w/o further intervention;  She had several post-op complications including a subphrenic abscess that required percutaneous drainage, and a left pleural effusion/ basilar atelectasis (s/p thoracentesis & improved)... She has improved slowly at home w/ family help & is planning return to work 11/30/10;  She notes sl cough w/ sm amt thick beige sput, & SOB/ weakness is gradually better;  Denies CP etc, notes insomnia & anxiety...  CXR today w/ improved left base, no clear baseline film;  Labs show Hg=11.6, BS=117, Creat=1.6, FLP looks good, LFTs are OK x sl incr AlkPhos, but note Ca=11.1 (was 8.6 one month ago)...         Problem List:    OBSTRUCTIVE SLEEP APNEA - Sleep Study 11/07 showed RDI=21 w/ desat to 62% during REM.Marland Kitchen. eval by DrSood w/ Rx for CPAP 12... compliance poor- only using it 1-2 times/wk... encouraged to use CPAP more regularly & to f/u w/ DrSood regarding the mask interface problems... ~  8/11:  she reports new mask but still not using CPAP regularly, "I rest fairly well".  BRONCHITIS, ACUTE - on ADVAIR 250Bid (using Prn only) & PROAIR as needed. 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 ~  CXR 1/09 was pre-op for TKR- cardomeg & ?mild  vasc congestion. ~  CXR 2/11 showed norm heart size & vascularity, clear, s/p cholecystectomy, DJD in TSpine. ~  CXR & CT Chest 11/11 showed sl peribronch thickening, no lung lesions, no signif adenopathy. ~  CXRs 3/12 in the perioperative period after splenectomy showed left effusion==> tapped & improved aeration. ~  CXR 4/12 back to baseline w/ clear bases, essent wnl...  HYPERTENSION - taking NORVASC 10mg /d,  DIOVAN 320mg /d, & off the prev diuretics due to leg cramps & renal insuffic... ~  4/12:  BP here 132/62  and she denies HA, visual changes, CP, palipit, dizziness, syncope, change in dyspnea, etc...  CAD - on ASA 81mg /d...  ~  Hx non-obstructive CAD w/ cath 6/07 by DrStuckey showing luminal irregularities & tiny first marginal branch of the CIRC w/ ostial lesion... good LVF. ~  2DEcho 9/07 showed mildly calcif AoV and normal LVF & wall motion. ~  2DEcho 11/11 showed norm LV wall thickness, norm LVF w/ EF= 60-65%, norm atria, norm valves, trivial peric fluid behind heart.  CEREBROVASCULAR DISEASE - she remains on ASA 81mg /d without TIA's or other neuro manifestations...  ~  MRA Br 9/07 showed mod intracranial atherosclerotic changes...   HYPERLIPIDEMIA - on LIPITOR 20mg /d ~  FLP 7/07 showed TChol 114, TG 85, HDL 28, LDL 69 ~  FLP 4/09 showed TChol 154, TG 192, HDL 34, LDL 82... discussed poss of adding fibrate- hold... ~  FLP 2/10 on Lip20 showed TChol 159, TG 207, HDL 29, LDL 83... rec> add Fenoglide (she didn't). ~  FLP 2/11 on Lip20 showed TChol 185, TG 242, HDL 43, LDL 99... add FENOFIBRATE 160mg /d... ~  FLP 8/11 on Lip20+Feno160 showed TChol 167, TG 216, HDL 37, LDL 96... she stopped Feno160. ~  FLP 4/12 on Lip20 showed TChol 146, TG 115, HDL 35, LDL 88  DM - on LEVEMIR & GLIMEPIRIDE 4mg /d,  prev Metformin & Januvia were stopped during 11/11 hosp, ~  labs 4/08 showed BS=138 & HgA1c=7.5.Marland Kitchen. home BS = 100 to >300... misses doses and not on diet or exercising... ~  1/09 became hypoglycemic in hosp on 4 meds post knee surg- meds adjusted... ~  3/09 restarted GLIMEPIRIDE 4mg - 1/2 tab each AM, & continue Lantus & Metformin... ~  labs 4/09 showed BS= 148, HgA1c= 7.3.Marland Kitchen. rec> back on 4 med regimen due to poor control at home. ~  6/09 discussed titrating the Lantus up until FBS 100-120 range... ~  labs 2/10 showed BS= 164, HgA1c= 8.6.Marland KitchenMarland Kitchen very disappointing- needs home BS monitoring, incr Lantus. ~  labs 2/11 showed BS= 298, A1c= 8.4...  discussed change Lantus to LEVEMIR 40 u daily... ~  labs 8/11  showed BS= 202, A1c= 10.9.Marland Kitchen. rec> incr Levemir to 50, consider Humalog. ~  Nov-Dec 2011:  BS up w/ adjust of meds & addition of Pred after hosp 11/11 & hypercalcemia... ~  She states she returned to Levemir 40u daily after the splenectomy hosp due to appetite etc;  4/12 BS=117  OBESITY (ICD-278.00) - weight down to 171 after hosp 11/11- doing better on diet, not yet exercising. ~  weight 220-230# in the early 1990's... ~  weight 210-220# in the early 2000's... ~  weight 2/10 = 192# ~  weight 2/11 = 186# ~  weight 8/11 = 174# ~  weight 12/11 = 171# ~  Weight 4/12 = 168#  HYPERCALCEMIA> this was most likely due to sarcoidosis w/ extensive splenic involvement;  Calcium was as hight as 13.7 and returned to normal after the splenectomy. ~  Labs 4/12 showed calcium = 11.1 & this is very disappointing...  GERD - on NEXIUM 40mg  Bid... w/ increased reflux symptoms w/ noct cough etc...  ~  2/10: discussed optimal Rx w/ Nex before dinner, Zantac300 + Reglan10 at bed, elev HOB, etc. ~  2/11: she stopped the Reglan, still using Nexium/ Zantac but w/ persist symptoms>  refer to GI for eval. ~  6/11: GI f/u DrPerry w/ EGD that was normal... continue Rx. ~  12/11:  increased GI symptoms post hosp> she will f/u w/ DrPerry for his input> incr Nexium Bid.  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 ~  6/11:  f/u colonoscopy by DrPerry showed divertics, 4 polyps, AVM.Marland KitchenMarland Kitchen path= tubular adenoma, f/u 8yrs.  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  RENAL INSUFFICIENCY (ICD-588.9) >> Creat ~ 2.0 & eval 10/11 by DrFox- prob due to DM & HBP... 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 on LYRICA 100mg  Qhs, off prev Tramadol Rx... ~  2010 eval by DrHiatt @ [facility] on Tramadol 50mg , Lyrica 75mg Bid...  GOUT, UNSPECIFIED (ICD-274.9) - Uric in the 7-8 range...on ALLOPURINOL 300mg /d for prevention...  ANXIETY (ICD-300.00) - she is under mod stress- work, mother died, hospice counselling, etc... ~  8/11:  rec starting ALPRAZOLAM 0.5mg  Tid for palpit, SOB, anxiety...  ANEMIA-NOS & MGUS >> see eval 10-11/11 by DrGranfortuna w/ bone marrow 11/11 in hosp= neg.  PSORIASIS - eval and Rx per DrTafeen prev on HUMIRA injections- stopped 9/11...   Past Surgical History  Procedure Date  . Cholecystectomy   . Vesicovaginal fistula closure & Total Abdominal Hysterectomy   . Total Knee Replacements> Right 5/04, Left 1/09> by DrAlusio   . Tonsillectomy   . Appendectomy    Open Splenectomy 2/12 by DrRosenbower     Outpatient Encounter Prescriptions as of 11/24/2010  Medication Sig Dispense Refill  . ADVAIR DISKUS 250-50 MCG/DOSE AEPB inhale 1 dose by mouth twice a day  60 each  5  . albuterol (PROAIR HFA) 108 (90 BASE) MCG/ACT inhaler Inhale 2 puffs into the lungs every 6 (six) hours as needed.        Marland Kitchen allopurinol (ZYLOPRIM) 300 MG tablet take 1 tablet by mouth once daily  30 tablet  PRN  . amLODipine (NORVASC) 10 MG tablet take 1 tablet by mouth once daily  30 tablet  PRN  . aspirin 81 MG tablet Take 81 mg by mouth daily.        Marland Kitchen atorvastatin (LIPITOR) 20 MG tablet take 1 tablet by mouth once daily  30 tablet  PRN  . esomeprazole (NEXIUM) 40 MG capsule Take 1 capsule (40 mg total) by mouth 2 (two) times daily before a meal.  30 capsule  1  . ferrous sulfate 325 (65 FE) MG tablet take 1 tablet by mouth once daily WITH A MEAL  30 tablet  0  . fluticasone (FLONASE) 50 MCG/ACT nasal spray 2 sprays by Nasal route at bedtime.        Marland Kitchen glimepiride (AMARYL) 4 MG tablet Take 1 tablet (4 mg total) by mouth daily before breakfast.  30 tablet  6  . insulin detemir  (LEVEMIR) 100 UNIT/ML injection Inject 40  Units into the skin daily.  12 mL  11  . pregabalin (LYRICA) 100 MG capsule Take 100 mg by mouth at bedtime.        . valsartan (DIOVAN) 320 MG tablet Take 320 mg by mouth daily.        Marland Kitchen DISCONTD: HYDROcodone-acetaminophen (NORCO) 5-325 MG per tablet take 1 tablet by mouth every 4 hours if needed  50 tablet  0    Allergies  Allergen Reactions  . Codeine   . Guaifenesin     REACTION: pt states Nausea \\T \ Vomiting  . Latex     REACTION: SWELLING  . Oxycodone     REACTION: N/V    Review of Systems         See HPI - all other systems neg except as noted... The patient complains of dyspnea on exertion.  The patient denies anorexia, fever, weight loss, weight gain, vision loss, decreased hearing, hoarseness, chest pain, syncope, peripheral edema, prolonged cough, headaches, hemoptysis, abdominal pain, melena, hematochezia, severe indigestion/heartburn, hematuria, incontinence, muscle weakness, suspicious skin lesions, transient blindness, difficulty walking, depression, unusual weight change, abnormal bleeding, enlarged lymph nodes, and angioedema.     Objective:   Physical Exam      WD, Overweight, 64 y/o BF in NAD...  GENERAL:  Alert & oriented; pleasant & cooperative... HEENT:  Ryan/AT, EOM-wnl, PERRLA, EACs-clear, TMs-wnl, NOSE-clear, THROAT- clear & wnl... NECK:  Supple w/ fair ROM; no JVD; normal carotid impulses w/o bruits; palp thyroid, w/o nodules felt; no lymphadenopathy. CHEST:  Clear to P & A; without wheezes/ rales/ or rhonchi. HEART:  Regular Rhythm; gr 1/6 SEM, without rubs/ or gallops. ABDOMEN:  Scar from splenectomy surg; obese, soft, & nontender w/ panniculus; normal bowel sounds; no organomegaly or masses detected EXT: without deformities, mod arthritic changes, s/p bilat TKRs; no varicose veins/ +venous insuffic/ tr edema. NEURO:  CN's intact; no focal neuro deficits... DERM:  Psoriasis rash per DrTafeen...   Assessment &  Plan:   SARCOIDOSIS>  Her prev ACE level was 63 & we will need to recheck this parameter...  Hypercalcemia>  Labs today showed recurrent hypercalcemia w/ level = 11.1;  Pred Rx would be very difficult due to her IDDM;  rec hydration, low calc diet/ no supplements, and short term follow up> may seek medical center referral for their help...  HBP/ CAD>  BP controlled and no angina etc;  She is planning return to work soon...  DM>  Control has been difficult;  Stressed diet/ exercise, slowly incr Levemir dose, and continue the Amaryl for now...  Renal Insuffic>  Creat stable at 1.6, reminded to stay well hydrated...  Other medical problems as noted.Marland KitchenMarland Kitchen

## 2010-11-24 NOTE — Patient Instructions (Signed)
Today we updated your med list in our EPIC system...    Continue your current meds the same...  Today we did your follow up CXR & fasting blood work...    Please call the PHONE TREE in a few days for your results...    Dial [id number] & when prompted enter your patient number followed by the # symbol...    Your patient number is:  [id number]#  Gradually increase your activities... Call for any problems... Let's plan a recheck in 6-8 weeks, sooner if needed.Marland KitchenMarland Kitchen

## 2010-11-28 ENCOUNTER — Encounter: Payer: Self-pay | Admitting: Pulmonary Disease

## 2010-11-28 DIAGNOSIS — D869 Sarcoidosis, unspecified: Secondary | ICD-10-CM | POA: Insufficient documentation

## 2010-11-29 ENCOUNTER — Other Ambulatory Visit: Payer: Self-pay | Admitting: Pulmonary Disease

## 2010-12-15 NOTE — Op Note (Signed)
[name], [name]            ACCOUNT NO.:  [id number]   MEDICAL RECORD NO.:  [id number]          PATIENT TYPE:  INP   LOCATION:  1608                         FACILITY:  [facility]   PHYSICIAN:  Ollen Gross, M.D.    DATE OF BIRTH:  23-Mar-1946   DATE OF PROCEDURE:  09/01/2007  DATE OF DISCHARGE:                               OPERATIVE REPORT   PREOPERATIVE DIAGNOSIS:  Osteoarthritis left knee.   POSTOPERATIVE DIAGNOSIS:  Osteoarthritis left knee.   PROCEDURE:  Left total knee arthroplasty.   SURGEON:  Dr. Lequita Halt.   ASSISTANT:  Avel Peace, PA-C.   ANESTHESIA:  Spinal.   ESTIMATED BLOOD LOSS:  Minimal.   DRAINS:  None.   COMPLICATIONS:  None.   CONDITION:  Stable to recovery room.   CLINICAL NOTE:  [name] is a 65 year old female with end-stage  arthritis of the left knee with progressively worsening pain and  dysfunction.  She has had a successful right total knee arthroplasty and  presents now for left total knee arthroplasty.   PROCEDURE IN DETAIL:  After successful administration of spinal  anesthetic, a tourniquet is placed high on her left thigh and left lower  extremity prepped and draped in the usual sterile fashion.  Extremity is  wrapped in Esmarch, knee flexed and tourniquet inflated to 300 mmHg.  A  midline incision is made with a 10 blade through the subcutaneous tissue  to the level of the extensor mechanism.  She had significant valgus  deformity so we used a fresh blade to make a lateral parapatellar  arthrotomy.  The soft tissue over the proximal lateral tibia is  subperiosteally elevated around the joint line to the posterolateral  corner but not including the structures of the posterolateral corner.  The patella was then everted medially.  The knee is flexed 90 degrees  and the ACL and PCL are removed. The drill was used to create a starting  hole in the distal femur and the canal is thoroughly irrigated.  The 5  degree left valgus alignment guide  is placed and referencing off the  posterior condyles, rotation is marked and the block pinned to remove 10  mm off the distal femur.  Distal femoral resection is made with an  oscillating saw.  A sizing block is placed, size 2.5 is the most  appropriate.  Rotation is marked off the epicondylar axis and a size 2.5  cutting block is placed.  The anterior and posterior chamfer cuts are  made.   The tibia is subluxed forward and the menisci are removed.  The  extramedullary tibial alignment guide is placed referencing proximally  at the medial aspect of the tibial tubercle and distally along the  second metatarsal axis and tibial crest.  The block is pinned to remove  about 4 mm from the more deficient lateral side.  Tibial resection is  made with an oscillating saw.  A size 2 is the most appropriate tibial  component and the proximal tibia is prepared with the modular drill and  keel punch for a size 2.  Femoral preparation is completed with the  intercondylar  cut for the size 2.5.   A size 2 mobile bearing tibial trial,  size 2.5 posterior stabilized  femoral trial and 10 mm posterior stabilized rotating platform insert  trial are placed.  With the 10 full extension is achieved with excellent  varus valgus anterior and posterior balance throughout full range of  motion.  The patella was then everted and thickness measured to be 21  mm.  Freehand resection was taken to 12 mm, 35 template is placed, lug  holes are drilled, trial patella is placed and it tracks normally.  Osteophytes are removed off the posterior femur with the trial in place.  All trials are removed and the cut bone surfaces are prepared with  pulsatile lavage the cement was mixed and once ready for implantation, a  size 2 mobile bearing tibia, size 2.5 posterior stabilized femur and 35  patella are cemented into place.  The patella is held with a clamp.  A  trial 10-mm insert is placed, knee held in full extension and  all  extruded cement removed.  When the cement is fully hardened then the  trial is removed and the wound copiously irrigated with saline solution.  The FloSeal was injected on the posterior capsule and then the permanent  10 mm posterior stabilized rotating platform insert is placed into the  tibial tray.  FloSeal is injected into the medial and lateral gutters  and suprapatellar area. A moist sponge is placed and tourniquet released  with a total time of approximately 33 minutes.  The sponge is held for 2  minutes and then removed. Minimal bleeding is encountered and that which  is encountered is stopped with electrocautery.  We then thoroughly  irrigated again and closed the arthrotomy with interrupted #1 PDS  leaving open a small area from the superior inferior pole of the patella  to serve as a mini lateral release.  Flexion against gravity is 135  degrees. The subcu is closed with interrupted 2-0 Vicryl and  subcuticular with running 4-0 Monocryl.  The incision is cleaned and  dried and Steri-Strips and a bulky sterile dressing applied.  She is  placed into a knee immobilizer, awakened and transported to recovery in  stable condition.      Ollen Gross, M.D.  Electronically Signed     FA/MEDQ  D:  09/01/2007  T:  09/01/2007  Job:  [id number]

## 2010-12-15 NOTE — Discharge Summary (Signed)
[name], [name]            ACCOUNT NO.:  [id number]   MEDICAL RECORD NO.:  [id number]          PATIENT TYPE:  INP   LOCATION:  1608                         FACILITY:  [facility]   PHYSICIAN:  Ollen Gross, M.D.    DATE OF BIRTH:  1946-01-30   DATE OF ADMISSION:  09/01/2007  DATE OF DISCHARGE:  09/07/2007                               DISCHARGE SUMMARY   ADMISSION DIAGNOSES:  1. Osteoarthritis left knee.  2. Vertigo.  3. History of asthma.  4. History of bronchitis.  5. Sleep apnea.  6. Hypertension.  7. Cardiac murmur.  8. Hypercholesterolemia.  9. Reflux disease.  10.History of gastritis.  11.History of pancreatitis.  12.Diabetes mellitus.  13.Postmenopausal.   DISCHARGE DIAGNOSES:  1. Osteoarthritis left knee status post left total knee replacement      arthroplasty.  2. Acute blood loss anemia.  3. Status post transfusion without sequelae.  4. Gouty attack/flare.  5. Mild postoperative hyponatremia, improved.  6. Vertigo.  7. History of asthma.  8. History of bronchitis.  9. Sleep apnea.  10.Hypertension.  11.Cardiac murmur.  12.Hypercholesterolemia.  13.Reflux disease.  14.History of gastritis.  15.History of pancreatitis.  16.Diabetes mellitus.  17.Postmenopausal.  18.Postoperative atelectasis.   PROCEDURE:  September 01, 2007, left total knee, surgeon Ollen Gross,  M.D. and assistant Alexzandrew L. Perkins, P.A.C.  Anesthesia was  spinal.   CONSULTATIONS:  None.   BRIEF HISTORY:  The patient is a 65 year old female with end-stage  arthritis of the left knee with progressive worsening pain and  dysfunction, successful right total knee, and now presents for a left  total knee.   LABORATORY DATA:  Preoperative CBC showed a hemoglobin 12.1, hematocrit  35.2, white blood cell count 5.8, red blood cell count 4.01, and  platelets 286.  Chemistry panel on admission all within normal limits.  Blood type O positive, PT and PTT 12.5 and 0.9 preoperatively  with a PTT  36.  UA; positive protein, otherwise negative UA.  Serial CBC's were  followed.  Hemoglobin did drop down to 10.5 and then got as low at 8.4,  given two units of blood.  Postoperative hemoglobin 10.9 and 31.3.  White count remained normal throughout the hospital course.  Serial pro  times followed daily.  PT and INR did get as high as 41.1 and 4.1.  Last  noted PT and INR prior to discharge was 40.1 and 3.9.  Serial BMET's  were followed.  Sodium did drop down to 134, came back up to 138.  Electrolytes remained within normal limits.  Uric acid taken on September 04, 2007, elevated at 10.1.  Follow-up UA on September 06, 2007; positive  protein, only rare squamous.  Urine culture was no growth, but the final  report was pending at the time of the dictation.   X-RAYS:  Chest x-ray; cardiomegaly with suspicion of mild vascular  congestion.  Postoperative chest x-ray on September 04, 2007; bibasilar  atelectasis with low lung volumes.   EKG on August 29, 2007; normal sinus rhythm, borderline, unconfirmed.   HOSPITAL COURSE:  The patient was admitted to [facility], taken to the operating room, and underwent above stated  procedure without complications.  The patient tolerated the procedure  well and later transferred to the recovery room on the orthopedic floor.  Started on PCM and p.o. analgesics for pain control for leg surgery and  started back on her home medicines.  Sodium was a little low  postoperative so we reduced the fluids.  She started weightbearing as  tolerated.  I encouraged p.o. medicines.  I did have a fair amount of  pain on the morning of day #1 and day #2.  I was doing a little bit  better through the weekend.  Dressing change on day #2, incision looked  good.  She remained afebrile.  The hemoglobin was down though to 9.3 on  day #2.  She was asymptomatic with this.  She had a little bit of small  amount of drainage on the incision, but no  signs of infection.  Small  blister noted.  She was starting to get up and do short ambulations  about 12-15 feet in the room.  By day #3 her blood count had gotten  lower down to 8.4.  With her history and slow progression it was felt  that she would benefit from undergoing transfusion.  Discussed blood,  given two units of blood, and tolerated the blood well.  Post  transfusion hemoglobin came back up to 10.9.  By day #4, however, the  patient had acute onset of right foot pain which is felt to be due to a  gout attack.  Uric acid level was checked which was noted to be elevated  at 10.1.  She also had a little bit of chest congestion.  She said that  she had an upper respiratory issue prior to coming in.  We did check a  chest x-ray that did not show any pneumonia.  She had a normal white  count that just showed a little vascular congestion, more atelectasis in  nature.  Albuterol nebulizers p.r.n., encouraged incentive spirometer.  Started her on Colchicine for the gouty attack.  Unfortunately due to  the gouty attack on day #4, she was unable to participate with therapy  due to the pain in the left knee and also the severe in the right foot  from gout attack.  By day #5 she was still hurting a fair amount.  On  the evening between day #4 and day #5 she spiked a temperature that  evening of 102, but it was back down to a normal level of 97.  Again the  chest x-ray did show more atelectasis in nature.  We encouraged  incentive spirometer.  We did check a UA later which did prove negative  and the urine culture was no growth, but the final report was pending at  the time of the dictation.  It was discussed due to the gouty flare and  lack of progress that she may need some skilled nursing.  We have  started looking for a bed.  She was able to get up and start progressing  a little bit better on the evening of day #5 and by day #6 of September 07, 2007, she was feeling much better.  The  gouty attack was under  control.  Her pain was under better control and she wanted to go home.  She did state that she had 24/7 care.  It is of note during the hospital  course that with her sugars, she  did have some hypoglycemic events  requiring reduction of her diabetic medications and also juices as  needed.  Her sugars have been running pretty well between 96 and 120,  but she was having intermittent hypoglycemic events, so we reduced her  dosages since she was going home.  I discussed with her holding off on  the glimepiride and the Januvia one in the morning and one in the  evening.  She will still remain on her Metformin.  I am also going to  reduce the dose a little bit of her insulin that she takes in the  morning.  I have placed a call over to [facility]. Kriste Basque, M.D.'s office and  spoke with Misty Stanley, who works on his staff.  I informed her of the action  of reducing the medications at this point.  I am going to have her check  her CBG's minimum twice a day and occasionally during the middle of the  day to monitor her sugars.  She states that she normally calls dr.  Jodelle Green office for adjustment of medications.  She is not comfortable  with doing too much of the medication adjusting herself.  We are going  to have her write down her sugars.  We will have her follow up with Dr.  Kriste Basque next week and obviously give them a call if there are any changes.  We have notified them and will allow her home later today on September 07, 2007.   PLAN:  The patient will be discharged home on September 07, 2007.   DISCHARGE MEDICATIONS:  1. Coumadin.  2. Vicodin.  3. Robaxin.  4. She will resume her home medicines with the exception that the      Lantus insulin will be reduced to 15 units.  We will hold      glimepiride 4 mg in the morning and we will also hold the Januvia      100 mg at bedtime.  She will remain on the Metformin 1000 mg twice      a day and she will continue all of her other  medications.   DIET:  Diabetic diet.   ACTIVITY:  She is weightbearing as tolerated to the left lower  extremity.  Home health PT and home health nursing.  Total knee  protocol.  She will follow up with Dr. Lequita Halt next week on Thursday or  Friday.  She will call the office for an appointment at [phone] and she  will also follow up with Dr. Jodelle Green office next week.  I instructed her  to give them a call to set up an appointment with him.  I have  instructed her also to write down her sugars, to record them, and take  them with her at her next appointment.   DISPOSITION:  Home.   CONDITION ON DISCHARGE:  Improving, gouty flare improving.      Alexzandrew L. Perkins, P.A.C.      Ollen Gross, M.D.  Electronically Signed    ALP/MEDQ  D:  09/07/2007  T:  09/07/2007  Job:  [id number]   cc:   Lonzo Cloud. Kriste Basque, MD  [address]  [place]  [place] [address]

## 2010-12-15 NOTE — H&P (Signed)
[name], [name]            ACCOUNT NO.:  [id number]   MEDICAL RECORD NO.:  [id number]          PATIENT TYPE:  INP   LOCATION:  1608                         FACILITY:  [facility]   PHYSICIAN:  Ollen Gross, M.D.    DATE OF BIRTH:  September 16, 1945   DATE OF ADMISSION:  09/01/2007  DATE OF DISCHARGE:                              HISTORY & PHYSICAL   DATE OF OFFICE VISIT HISTORY AND PHYSICAL:  Performed on August 25, 2007   CHIEF COMPLAINT:  Left knee pain.   HISTORY OF PRESENT ILLNESS:  The patient is a 65 year old female well-  known to Dr. Ollen Gross having previously undergone a right total  knee back in 2004.  She is doing well with that.  The left knee is the  main problem now.  She has progressive worsening pain and dysfunction.  She has known end-stage arthritis, was felt to be a good candidate.  Risks and benefits have been discussed and she has elected to proceed  with surgery.   ALLERGIES:  OXYCODONE causes sickness.  She also has LATEX allergies.   CURRENT MEDICATIONS:  Lantus insulin, Advair Diskus 250/50, metformin,  glimepiride, Diovan, amlodipine, Lyrica, furosemide, Lipitor, Januvia, a  multivitamin, tramadol, cefdinir, ProAir, 81-mg aspirin, Extra-Strength  Tylenol, Metamucil.   PAST MEDICAL HISTORY:  1. Vertigo.  2. Asthma.  3. Bronchitis.  4. Sleep apnea.  5. Hypertension.  6. Cardiac murmur.  7. Hypercholesterolemia.  8. Varicose veins.  9. Reflux disease.  10.History of gastritis.  11.Insulin-dependent diabetes mellitus.  12.Childhood illnesses to include measles and mumps.  13.Postmenopausal.  14.Pancreatitis.   PAST SURGICAL HISTORY:  1. Appendectomy.  2. Tonsillectomy.  3. Cholecystectomy.  4. Breast reduction.  5. Hysterectomy.  6. Right total knee.  7. She had a right knee scope prior to the total knee.   SOCIAL HISTORY:  Single, Media planner.  Nonsmoker, no alcohol.  Lives alone.  Her sister will be assisting with care after  surgery.   FAMILY HISTORY:  Father with history of congestive heart failure,  bronchial pneumonia.  Mother with Alzheimer's and stroke.  Sister with  heart attack.   REVIEW OF SYSTEMS:  GENERAL:  No fevers, chills, night sweats.  NEURO:  No seizures, syncope, paralysis.  RESPIRATORY:  A little bit of  shortness of breath on exertion and some coughing.  No shortness breath  at rest.  No productive cough or hemoptysis.  CARDIOVASCULAR:  No chest  pain, angina, orthopnea.  GI:  No nausea, vomiting, diarrhea or  constipation.  GU:  A little bit of frequency and nocturia.  No dysuria  or hematuria.  MUSCULOSKELETAL:  Left knee pain, history of present  illness.   PHYSICAL EXAMINATION:  VITAL SIGNS:  Pulse 76, respirations 14, blood  pressure 158/68.  GENERAL:  A 65 year old [place] American female, well-nourished, well-  developed, short-statured, overweight, alert and oriented and  cooperative, very pleasant.  HEENT:  Normocephalic, atraumatic.  Pupils are round and reactive.  Oropharynx clear.  EOMs intact.  NECK:  Supple.  CHEST:  Clear.  HEART:  Regular rate and rhythm with a systolic ejection murmur  grade  2/6 to 3/6 best heard over aortic and pulmonic point.  Also heard over  Erb's point.  ABDOMEN:  Soft, round, bowel sounds present.  RECTAL, BREAST, GENITALIA:  Not done, not pertinent to present illness.  EXTREMITIES:  Left knee range of motion 5-115.  No instability.  Slight  valgus malalignment deformity.   IMPRESSION:  1. Osteoarthritis left knee.  2. Vertigo.  3. History of asthma.  4. History of bronchitis.  5. Sleep apnea.  6. Hypertension.  7. Cardiac murmur.  8. Hypercholesterolemia.  9. Reflux disease.  10.History of gastritis.  11.History of pancreatitis.  12.Insulin-dependent diabetes mellitus.  13.Postmenopausal.   PLAN:  The patient will be admitted to [facility] to undergo a  left total knee arthroplasty.  Surgery will be performed by dr.  Ollen Gross.  Dr. Alroy Dust is her medical physician, who will be notified  of the room number on admission and be consulted to assist with medical  management of the patient in the postoperative period.      Alexzandrew L. Perkins, P.A.C.      Ollen Gross, M.D.  Electronically Signed    ALP/MEDQ  D:  09/02/2007  T:  09/03/2007  Job:  [id number]   cc:   Lonzo Cloud. Kriste Basque, MD  [address]  [place]  [place] [address]

## 2010-12-17 ENCOUNTER — Encounter (INDEPENDENT_AMBULATORY_CARE_PROVIDER_SITE_OTHER): Payer: Self-pay | Admitting: General Surgery

## 2010-12-18 NOTE — Op Note (Signed)
[name], [name]                      ACCOUNT NO.:  [id number]   MEDICAL RECORD NO.:  [id number]                   PATIENT TYPE:  INP   LOCATION:  0004                                 FACILITY[facility]  [facility]   PHYSICIAN:  Ollen Gross, M.D.                 DATE OF BIRTH:  Feb 10, 1946   DATE OF PROCEDURE:  12/24/2002  DATE OF DISCHARGE:                                 OPERATIVE REPORT   PREOPERATIVE DIAGNOSIS:  Osteoarthritis, right knee.   POSTOPERATIVE DIAGNOSIS:  Osteoarthritis, right knee.   PROCEDURE:  Right total knee arthroplasty.   SURGEON:  Ollen Gross, M.D.   ASSISTANT:  Alexzandrew L. Julien Girt, P.A.   ANESTHESIA:  Attempted spinal and then general.   ESTIMATED BLOOD LOSS:  Minimal.   DRAINS:  Hemovac x1.   COMPLICATIONS:  None.   TOURNIQUET TIME:  53 minutes at 300 mmHg.   CONDITION:  Stable to recovery.   BRIEF CLINICAL NOTE:  [name] is a 65 year old female with end-stage  osteoarthritis of the right knee with pain refractory to nonoperative  management including injections. She presents now for right total knee  arthroplasty.   DESCRIPTION OF PROCEDURE:  After attempted administration of spinal  anesthetic, the spinal did not appear to be functional and thus she had a  successful administration of general anesthetic. A tourniquet was placed  high on her right thigh and right lower extremity prepped and draped in the  usual sterile fashion. Extremities wrapped in Esmarch, knee flexed,  tourniquet inflated to 300 mmHg. A standard midline incision was made with a  10 blade through subcutaneous tissue to the level of the extensor mechanism.  A fresh blade was used to make a medial parapatellar arthrotomy and then the  soft tissue over the proximal medial tibia subperiosteally elevated to the  joint line with a knife as well as the soft tissue over the proximal lateral  tibia also elevated with attention being paid to avoiding the patellar  tendon on  the tibial tubercle. The patella was everted, knee flexed 90  degrees and ACL and PCL removed. A drill was used to create a starting hole  in the distal femur, canal was irrigated and then a five degree right valgus  alignment guide placed. Referencing off the posterior condyles, rotations  marked and a block pinned to remove 10 mm off the distal femur. Distal  femoral resection was made with an oscillating saw. A sizing block is placed  and a size 3 is most appropriate. The AP block is then placed with the  rotation corresponding with the epicondylar axis. The anterior and posterior  cuts are then made.   The tibia is then subluxed forward and the menisci removed. The  extramedullary tibial alignment guide is placed referencing proximally at  the medial aspect of the tibial tubercle and distally along the second  metatarsal axis of the tibial crest. Blocks pinned to remove  10 mm off the  nondeficient medial side. Tibial resection is made with an oscillating saw.  A size 3 is placed and the proximal tibia is then prepared with the modular  drill and keel punch. The femoral preparation is then completed with the  intercondylar and chamfer cut.   Trials are placed, three posterior stabilized femoral trial with the size  three mobile bearing tibial trial. With the 10 mm posterior stabilized  rotating platform insert full extension is not achieved, thus I went back  and cut 2 more millimeters off the tibia, repaired the proximal tibia and  then we put the trials back in and full extension was achieved with  excellent varus and valgus balance throughout full range of motion. The  patella was then again everted, thickness measured to be 21 mm, free hand  resection taken to 13 mm. A 35 template placed, lug holes drilled, trial  patella placed and it tracks normally. The osteophytes are then removed off  the posterior femur with the trial in place. The trials were all removed and  cut bone  surfaces prepared with pulsatile lavage. Cement is mixed and once  ready for implantation, the size 3 mobile bearing tibial tray, size 3  posterior stabilized femur and 35 patella are cemented into place and  patella is held with a clamp. Once the cement is fully hardened, all  extruded cement is removed and the permanent 10 mm posterior stabilized  rotating platform insert is placed into the tibial tray. The wound is  copiously irrigated with antibiotic solution and the extensor mechanism  closed over a Hemovac drain with interrupted #1 PDS. The tourniquet was  released with a total time of 53 minutes. Flexion against gravity is 130  degrees. Subcu tissue is then closed with interrupted 2-0 Vicryl,  subcuticular running 4-0 Monocryl. The incision was clean and dry and Steri-  Strips and a bulky sterile dressing applied. Drains hooked to suction, she  was placed in a knee immobilizer, awakened and transported to recovery in  stable condition.                                               Ollen Gross, M.D.    FA/MEDQ  D:  12/24/2002  T:  12/24/2002  Job:  [id number]

## 2010-12-18 NOTE — Assessment & Plan Note (Signed)
[name]                             PULMONARY OFFICE NOTE   NAME:[name], [name]                   MRN:          [id number]  DATE:07/05/2006                            DOB:          12/07/1945    HISTORY OF PRESENT ILLNESS:  The patient is a 65 year old, African-  American female patient of Dr. Jodelle Green who has a known history of  hypertension, hyperlipidemia, and diabetes mellitus who presents for a  routine office visit.  The patient's last hemoglobin A1C was 6, however,  last visit 2 months ago, the patient had been complaining of increased  blood sugars around 350.  Januvia 100 mg was added to her current  regimen.  The patient reports she has been tolerating it well.  Her  blood sugars have been averaging around 120-140 over the last 2 months.  The patient denies any hypoglycemic episodes.  Recently, the patient  underwent a sleep study due to complaints of increased daytime  hypersomnolence with multiple awakenings through the night, which was  positive for obstructive sleep apnea with desaturations down to 62%.  The patient reports that she continues to feel quite fatigued upon  awakening in the mornings and feels very sleepy throughout the day.  The  patient denies any chest pain, shortness of breath, abdominal pain,  nausea, vomiting, or leg swelling.   PHYSICAL EXAM:  The patient is a pleasant, obese female in no acute  distress.  She is afebrile with stable vital signs.  O2 saturation is 100% on room  air.  Weight is at 212.  HEENT:  Unremarkable.  NECK:  Supple without cervical adenopathy.  No JVP.  Lungs sounds are clear.  CARDIAC:  S1, S2 without murmur or gallop.  ABDOMEN:  Soft and benign.  EXTREMITIES:  Warm without any calf tenderness, cyanosis, clubbing, or  edema.   IMPRESSION AND PLAN:  1. Diabetes mellitus. We will need to recheck an A1C with recent      addition of Januvia to her present regimen.  The patient is  advised      on dietary and exercise measures.  2. Hypertension, currently well-controlled, currently well compensated      on medication.  The patient will continue on her present regimen      and follow back up with Dr. Kriste Basque in 2-3 months or sooner if      needed.  3. Hyperlipidemia with a LDL goal of less than 70.  The patient is      presently at goal with last total cholesterol of 114 and LDL of 69,      presently on Lipitor 40.  The patient's fasting lipid panel is      pending today.  4. Newly diagnosed obstructive sleep apnea.  The patient has been      referred over to our sleep specialist, Dr. Craige Cotta or Dr. Shelle Iron to be      set up for a sleep __________ for possible      initiation of continuous positive airway pressure.  The patient      will return back with Dr.  Nadel in 2-3 months or sooner if needed.      Rubye Oaks, NP  Electronically Signed      Lonzo Cloud. Kriste Basque, MD  Electronically Signed   TP/MedQ  DD: 07/05/2006  DT: 07/05/2006  Job #: [id number]

## 2010-12-18 NOTE — Discharge Summary (Signed)
[name], [name]                      ACCOUNT NO.:  [id number]   MEDICAL RECORD NO.:  [id number]                   PATIENT TYPE:  IPS   LOCATION:  4146                                 FACILITY:  [facility]   PHYSICIAN:  [name], M.D.             DATE OF BIRTH:  01-24-1946   DATE OF ADMISSION:  12/27/2002  DATE OF DISCHARGE:  01/04/2003                                 DISCHARGE SUMMARY   DISCHARGE DIAGNOSES:  1. Right  total knee arthroplasty.  2. Deep venous thrombosis prophylaxis.  3. History of hypertension.  4. History of diabetes.  5. Anemia.   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 The patient underwent a right  total knee  arthroplasty Dec 25, 2002, by Dr. Lequita [name]. The patient was Coumadin for DVT  prophylaxis. The physical therapy report at this time shows the patient is  weight bearing as tolerated and is ambulating 60 feet with supervision and a  rolling walker, for transfers minimum assistance and for bed mobility  minimum assistance. Her hospital course was significant for anemia and  hyponatremia. The patient was transferred to the [facility]  Rehabilitation Department on Dec 27, 2002.   PAST MEDICAL HISTORY:  1. Hypertension.  2. Psoriasis.  3. Gastroesophageal reflux disease.  4. Non insulin diabetes mellitus.   PAST SURGICAL HISTORY:  1. Hysterectomy.  2. Cholecystectomy.  3. Breast cancer.   MEDICATIONS PRIOR TO ADMISSION:  1. Norvasc 5 mg daily.  2. Glyburide 5 mg daily.  3. Lisinopril 4 mg daily.  4. Ultracet.  5. Methotrexate 2.5 mg 4 tablets daily.  6. Avandamet.   ALLERGIES:  None.   PRIMARY CARE PHYSICIAN:  [name]. [name], M.D. [facility]   SOCIAL HISTORY:  The patient [place] elderly mother [place] Independent  prior to admission with a cane in a 1 level  home with no steps to entry. 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 She  has good support of the local family.   REVIEW OF SYSTEMS:  Significant for negative chest pain. No nausea or  vomiting. Positive for joint  pain and joint swelling.   HOSPITAL COURSE:  [name] was admitted to [facility]  Rehabilitation Department on Dec 27, 2002, for comprehensive inpatient  rehabilitation where she received more than 3 hours of therapy daily.  Overall the patient made pretty good progress during her 8-day stay in  rehabilitation. Her main problem was pain management. The patient was  initially started on  OxyContin 10 mg p.o. q.12h. Due to significant pain  the  OxyContin was increased to 30 mg p.o. q.12h. and the patient finally  had some relief with pain management. The patient also received  Oxycodone  p.r.n. as well  as Skelaxin as needed for muscle spasms.   The patient's hospital course was also significant for slightly elevated  blood pressure despite being on Norvasc, and numbness in the lower extremity  and toes, probably related to back pain and anemia. The patient remained on  Trinsicon 1 tablet  p.o. b.i.d. for anemia.  She had an admission hemoglobin  of 10.0. The patient remained on Coumadin during her entire stay for DVT  prophylaxis. No bleeding or complications were noted.   At the time of admission the patient was placed on Norvasc 5 mg  p.o. every  day for hypertension. Due to slightly elevated blood pressure, Norvasc  was  increased  from 5 to 10 mg p.o. every day and her blood pressure did  improve.   The patient occasionally did complain of joint pain. She was started on  Bextra 10 mg p.o. every day on January 02, 2003.   Besides occasional flatus there were no other major issues that occurred  while she was in rehabilitation. She remained on Glucophage as well as  Avandia for her diabetes. Her CBGs remained in good control.   LABORATORY DATA:   Latest labs:  Hemoglobin 10.0, hematocrit 28.9, white  blood cell count 5.4, platelet count 257. Latest INR performed was 2.0 on  January 04, 2003. Latest sodium 131, potassium 4.0, chloride 101, CO2 30,  glucose 99, BUN 11, creatinine 0.9. AST 15, ALT 15, alkaline phosphatase 72,  total bilirubin 0.4. Urine culture:  Greater than 75 colonies of multiple  species present __________ isolated.   CONDITION ON DISCHARGE:  At the time of discharge her surgical incision was  well healed with 1+ edema. No signs of infection. The physical therapy  report indicates the patient is able to ambulate weight bearing as tolerated  approximately 150 feet, modified independent  with a rolling walker,  transfers and sit-to-stand modified independent. Bed mobility modified  independent. She could perform most activities of daily living modified  independently. The patient has approximately 81 degrees flexion in her right  knee. She was overall able to tolerate therapy very well. At this time her  physical examination is unremarkable. Her CBGs are running from 81 to 141  and 76 and vital signs were stable. The patient was discharged to home with  her family.   DISCHARGE MEDICATIONS:  1. Norvasc 10 mg daily.  2. Avandamet 4 mg 1 tablet b.i.d.  3. Glyburide 5 mg b.i.d.  4. Folic acid 5 mg daily.  5. Trinsicon 1 tablet b.i.d.  6. OxyContin 30 mg on a taper.  7. Oxycodone 5 to 10 mg q.4-6h. p.r.n.  8. Coumadin 1 mg until January 24, 2003.  9. Resume Adamant.  10.      Skelaxin 40 mg 1 to  2 tablets q.6h. p.r.n.  11.      Lisinopril 40 mg daily.  12.      Vioxx 12.5 mg daily.   DISCHARGE INSTRUCTIONS:  Activity, no driving, no drinking alcohol. Use  walker. No aspirin, ibuprofen or Aleve while on Coumadin. Diet no  concentrated sweets, check CBGs at least twice daily. Her home health nurse  will monitor PT and OT will monitor Coumadin. Her first draw will be on  Monday, January 07, 2003.  FOLLOW UP:  She will  follow up with Dr. Lequita [name] in 2 weeks, call for an  appointment. Follow up with Dr. Kriste [name] in 6 weeks, call for an appointment.     [name], P.A.  [name], M.D.    LH/MEDQ  D:  01/04/2003  T:  01/05/2003  Job:  [id number]   cc:   Ollen Gross, M.D.  [address]  [place]  [place] [address]  Fax: [phone]   [name]. [name], M.D. [facility]

## 2010-12-18 NOTE — Assessment & Plan Note (Signed)
[facility]                             PULMONARY OFFICE NOTE   NAME:[name], [name]                   MRN:          [id number]  DATE:10/17/2006                            DOB:          Mar 04, 1946    I saw [name] today in followup to review the results of her sleep  study and assess her tolerance of CPAP therapy.   She had undergone a CPAP titration on September 02, 2006 and was titrated  to a CPAP pressure of 12 cm of water.  At this pressure setting her  apnea-hypopnea index was reduced to 1.9.  She was observed in both REM  sleep and supine sleep and had her oxygenation stabilized.  She has been  on CPAP therapy for the last one month, she is currently using a full  face mask with heated humidification.  She uses the machine about 3  hours a night every other night.  She says that she has noticed that the  more she uses it, she does feel better the next day.  She generally does  not appear to have much difficulty falling asleep with the mask, but  says that she has noticed that there is some leak of air around the  mask.  Unfortunately she did not bring her equipment with her today.  She says that after she is asleep for about 3-4 hours she wakes up and  then feels like she is being smothered by the mask and takes it off.  She has tried putting it back on again afterwards and says that she is  able to fall asleep again, although she does not always so this.  I have  asked her to follow up with her home care company to assess the fitting  of her mask, particularly since she says that she has been having  problems with dryness in her mouth in spite of using a full face mask.  I have encouraged her to then try and use her machine every night for  the entire night that she is asleep.  We also had further discussions  with regards to her diet, exercise, and weight reduction program.   I will follow up with her in approximately 3-4 months to  further assess  her tolerance of her CPAP therapy.     Coralyn Helling, MD  Electronically Signed    VS/MedQ  DD: 10/17/2006  DT: 10/18/2006  Job #: [id number]   cc:   Lonzo Cloud. Kriste Basque, MD

## 2010-12-18 NOTE — Consult Note (Signed)
[name], [name]            ACCOUNT NO.:  [id number]   MEDICAL RECORD NO.:  [id number]          PATIENT TYPE:  INP   LOCATION:  1430                         [facility]ACILITY[facility]  [facility]   PHYSICIAN:  Gerrit Friends. Dietrich Pates, MD, FACCDATE OF BIRTH:  09-28-1945   DATE OF CONSULTATION:  04/13/2006  DATE OF DISCHARGE:                                   CONSULTATION   REFERRING PHYSICIAN:  Dr. Alroy Dust.   PRIMARY CARDIOLOGIST:  Dr. Daleen Squibb.   HISTORY OF PRESENT ILLNESS:  65 year old woman with insulin-requiring  diabetes, referred for evaluation of syncope.  [name] was recently  underwent extensive cardiology assessment for severe exertional dyspnea and  intermittent edema.  An echocardiogram showed very mild aortic stenosis,  normal left ventricular systolic function and no other significant valvular  abnormalities.  On cardiac catheterization, left ventricular systolic  function was again verified to be normal.  There was a 8 mm gradient across  the aortic valve consistent with very mild stenosis.  Coronary angiography  revealed a significant stenosis only in a very small marginal branch of the  circumflex.  Carotid ultrasound revealed nonobstructive plaque.   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  She  describes no recent symptoms nor illnesses.  She felt well until she retired  to the bathroom to urinate.  On returning to her desk, she felt dizzy.  The  next thing she recalls is someone awakening her and telling her that she  needed to go to the hospital.  She was brought to the emergency department  by private automobile.  Onlookers apparently described her becoming non-  responsive and slumping over her desk,  although she did not fall to the  floor.  She has no history of similar episodes.  She has never had  orthostatic lightheadedness.  She did have vertigo in the past, but there  was no loss of consciousness.  She has no known neurologic disease.   She has  not had problems with hypoglycemia, and initial blood glucose in the  hospital was in excess of 200.  She has not had problems with hypotension,  and initial systolic blood pressure was in excess of 160 in hospital.   PAST MEDICAL HISTORY:  Past medical a history is extensive.  She has had  hypertension that has been generally been well-controlled with medical  therapy.  Likewise for her diabetes..  She has been treated with statins  because of diabetes and insignificant vascular disease.  She  has a history  of asthma and neuropathy.  Surgeries have included cholecystectomy,  tonsillectomy and right total knee  arthroplasty in 2004.  The patient has  not used tobacco products.   SOCIAL HISTORY:  Employment as noted above; no excessive use of alcohol.   FAMILY HISTORY:  Multiple close relatives with diabetes;  positive for  hypertension; only a 51 year old brother has known heart disease.   REVIEW OF SYSTEMS:  Notable for nocturnal snoring with some daytime  somnolence.  Exercise is limited due to her orthopedic problems - she does  some arm exercise.  She has skin lesions and has been told that these  represent psoriasis.  She underwent breast reduction surgery in the 1990s as  well as hysterectomy.  She has been told of GERD in the past. The patient  reports fluid retention with intermittent edema and abdominal swelling.  All  other systems reviewed and are negative.   ALLERGIES:  The patient has no known allergies.   MEDICATIONS ON ADMISSION:  Included Avandia 4 mg b.i.d., Nexium 40 mg q.d.,  furosemide 20 mg q.d., Diovan 320 mg q.d., metformin 1000 mg b.i.d.,  Glimepiride 4 mg q.d., atorvastatin 40 mg q.d. Lantus insulin 24 units q.d.  amlodipine 5 mg q.d., benazepril 10 mg q.d.   PHYSICAL EXAMINATION:  Heart rate 60 and regular, respirations 16, blood  pressure one 160/70, afebrile.  HEENT:  EOMs full; pupils equal, round, react to light; normal lids and   conjunctivae; normal oral mucosa.  NECK: No jugular venous distension; normal carotid upstrokes without bruits.  ENDOCRINE:  No thyromegaly.  HEMATOPOIETIC:  No adenopathy.  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  LUNGS:  Clear.  CARDIAC:  Normal first and second heart sounds; grade 2/6 basilar systolic  ejection murmur with preservation of the second heart sound.  ABDOMEN:  Obese; otherwise benign; no organomegaly; no masses.  EXTREMITIES:  Trace edema; normal distal pulses.  NEUROMUSCULAR:  Symmetric strength and tone; normal cranial nerves.  PSYCHIATRIC:  Alert and oriented; normal affect.  MUSCULOSKELETAL:  Surgical scar on the right knee; no joint deformities.   EKG:  Sinus bradycardia; otherwise normal.   LABORATORY DATA:  Initial lab otherwise notable for a hemoglobin of 11 with  normal MCV, slightly elevated BNP level, normal D-dimer and TSH, normal  point of care markers and an excellent lipid profile under treatment except  for decreased HDL.   IMPRESSION:  [name] presents with syncope following premonitory  dizziness.  Statistically, the most likely etiology is neurocardiogenic.  Orthostatic hypotension will be ruled out with lying and standing blood  pressures.   In the setting of chronic anemia, GI bleeding is a concern.  Stool for  Hemoccults have been requested.  Additional evaluation of anemia will be  undertaken as warranted.   She has recently undergone an extensive cardiology assessment.  Most likely  causes of syncope other than conduction system disease have been excluded.  Completing rule out myocardial infarction testing is warranted as well as a  total of 36 hours of monitoring.   She describes intermittent edema.  This could reflect peripheral venous  disease or fluid retention and perhaps diastolic heart failure.  I have  suggested that she adjust her dosage of furosemide as needed to control  these  symptoms.  She also describes symptoms consistent with sleep apnea.  Assessment can be  taken on an outpatient basis following hospital discharge.   We greatly appreciate the request for consultation and will be happy to  follow this nice woman with you in hospital.  She will be seen by Dr. Daleen Squibb  soon after discharge to direct further evaluation if warranted.      Gerrit Friends. Dietrich Pates, MD, [facility]  Electronically Signed     RMR/MEDQ  D:  04/13/2006  T:  04/13/2006  Job:  [id number]

## 2010-12-18 NOTE — Procedures (Signed)
EEG NUMBER:  [id number].   This 65 year old female is being evaluated for syncope and has a history of  low blood sugars.   TECHNICAL DESCRIPTION:  This EEG was recorded during the awake state and  stage II sleep states.  The background activity during the awake state was 9  hertz rhythm with higher amplitudes seen in the posterior head regions  bilaterally.  There was no evidence of any focal asymmetry or epileptiform  activity.  Photic stimulation and hyperventilation testing were not  performed.   IMPRESSION:  This is a normal EEG during the awake and stage II sleep  states.           ______________________________  Genene Churn. Sandria Manly, M.D.     EAV:[id number]  D:  04/13/2006 16:18:25  T:  04/14/2006 11:56:17  Job #:  [id number]

## 2010-12-18 NOTE — Discharge Summary (Signed)
[name], [name]                      ACCOUNT NO.:  [id number]   MEDICAL RECORD NO.:  [id number]                   PATIENT TYPE:  INP   LOCATION:  0478                                 [facility]ACILITY[facility]   PHYSICIAN:  Ollen Gross, M.D.                 DATE OF BIRTH:  [birth year]   DATE OF ADMISSION:  12/24/2002  DATE OF DISCHARGE:  12/27/2002                                 DISCHARGE SUMMARY   ADMITTING DIAGNOSES:  1. 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  2. Hypertension.  3. Reflux disease.  4. Non-insulin-dependent diabetes mellitus.   DISCHARGE DIAGNOSES:  1. Osteoarthritis right knee status post right total knee replacement     arthroplasty.  2. Osteoarthritis left knee.  3. Postoperative hyponatremia, improved.  4. Hypertension.  5. Reflux disease.  6. Non-insulin-dependent diabetes mellitus.   PROCEDURE:  The patient was taken to the OR on Dec 24, 2002.  Underwent a  right total knee per Ollen Gross, M.D., assisted by Alexzandrew L.  Julien Girt, P.A.  Anesthesia attempted spinal, then conversion to general.  Minimal blood loss.  Hemovac drain x1.  Tourniquet time 53 minutes at 300  mmHg.   CONSULTS:  Rehabilitation services, Ranelle Oyster, M.D.   BRIEF HISTORY:  The patient is a 65 year old female who has been seen by  Ollen Gross, M.D. for ongoing bilateral knee pain.  The right knee is more  symptomatic and problematic.  It has been increasing over the past year or  so.  However, over the past three or four months it has been much more  aggressive.  X-rays in the office show end-stage bone on bone changes.  She  has been treated conservatively in the past with anti-inflammatories but she  is at a point now knee is affecting her lifestyle activities and she  presents for surgical intervention.  Risks and benefits discussed.  The  patient is subsequently admitted to the hospital.   LABORATORY DATA:  CBC on admission  hemoglobin 12.4, hematocrit 36.0, white  cell count 5.0, red cell count 4.09.  Differential within normal limits.  Postoperative H&H 10.9 and 31.6.  Last noted H&H 10.1, 28.7.  PT/PTT on  admission were 13.0 and 33, respectively, INR 0.9.  Serial pro times  followed.  Last noted PT/INR 27.5 and 3.2.  Chemistry panel on admission  elevated glucose 147.  Remaining chemistry panel all within normal limits.  Serial BMETs were followed.  Glucose went up to 212, back down to 133.  Sodium dropped from 138 down to 128, back up to 133.  Calcium dropped from  9.3 down to 8.3, back up to 9.0.  Urinalysis on admission was positive for  protein, only few epithelial cells, 0-2 white cells, 0-2 red cells, rare  bacteria, otherwise negative.  Blood group type O+.   EKG dated Dec 18, 2002:  Normal sinus rhythm with sinus arrhythmia, normal  EKG.  No old tracing to compare.  Confirmed by Gonvick Bing, M.D.  Chest  x-ray dated Dec 18, 2002:  Borderline cardiac size, no active chest disease  radiographically.   HOSPITAL COURSE:  The patient was admitted to [facility], taken to  OR.  Underwent above stated procedure without complication.  The patient  tolerated procedure well.  Given 24 hours postoperative antibiotics.  Vital  signs followed.  Hemovac drain placed at time of surgery was pulled on  postoperative day one without difficulty.  The patient underwent a  rehabilitation consult with Ranelle Oyster, M.D.  The patient was seen  and felt that she would be an appropriate rehabilitation candidate.  PT and  OT were consulted to assist with gait training, ambulation, and ADLs.  She  was started on total knee protocol and also the CPM.  The patient was  progressed fairly well with physical therapy, slow therapy the first couple  of days, but did increase up to about 60 feet by postoperative day two.  Dressing change was initiated on postoperative day two.  Incision was  looking good.  She  was weaned over to p.o. medications and a PCA and IV was  discontinued along with the Foley.  By day three she continued to work with  her mobility and there was some question of whether she would still need  rehabilitation or if she would be able to progress with further  hospitalization and home.  It was noted later that day, however, a bed  became available in the rehabilitation unit.  The patient was progressing  with rehabilitation and felt she was appropriate and therefore it was  decided the patient would be transferred over at that time.   DISCHARGE PLAN:  The patient is transferred over to [facility].   DISCHARGE DIAGNOSES:  Please see above.   DISCHARGE MEDICATIONS:  Continue current medications.  1. Coumadin as per protocol.  2. Percocet for pain.  3. Robaxin for spasm.  4. Continue home medications.   DIET:  Diabetic low sodium diet.   ACTIVITY:  Full weightbearing.  Total knee protocol.  PT and OT for  continued gait training, ambulation, ADL while on rehabilitation.   FOLLOWUP:  Two weeks.   DISPOSITION:  [facility].   CONDITION ON DISCHARGE:  Improved.     Alexzandrew L. Julien Girt, P.A.              Ollen Gross, M.D.    ALP/MEDQ  D:  02/01/2003  T:  02/01/2003  Job:  [id number]

## 2010-12-18 NOTE — H&P (Signed)
NAMELAVONN, MAXCY                      ACCOUNT NO.:  [id number]   MEDICAL RECORD NO.:  [id number]                   PATIENT TYPE:  INP   LOCATION:  0478                                 FACILITY:  [facility]   PHYSICIAN:  Ollen Gross, M.D.                 DATE OF BIRTH:  17-Mar-1946   DATE OF ADMISSION:  12/24/2002  DATE OF DISCHARGE:                                HISTORY & PHYSICAL   CHIEF COMPLAINT:  Right knee pain.   HISTORY OF PRESENT ILLNESS:  The patient is a 65 year old female who has  been seen by Dr. Lequita Halt for ongoing right knee pain.  She is the daughter  of another patient of Dr. Deri Fuelling and comes in for evaluation for  bilateral knee pain; however, she states the right knee is more symptomatic  and more problematic than the left.  The pain has been progressive over the  past one year; however, over the past three to four months, it has been much  more aggressive.  X-rays of the knee show significant lateral and patellar  femoral degeneration with right more destructed than the left.  She has bone-  on-bone changes noted.  She has been treated conservatively in the past with  anti-inflammatories.  She is at a point now where her knees have started to  affect her lifestyle and would like to have something done about it.  Risks  and benefits of a total knee procedure have been discussed with the patient,  and she has elected to proceed with surgery.   ALLERGIES:  No known drug allergies.   CURRENT MEDICATIONS:  1. Norvasc 5 mg one tablet daily.  2. Avandamet 4/500 mg one tablet twice per day.  3. Glyburide 5 mg one tablet twice per day.  4. Lisinopril 4 mg one tablet daily.  5. Methotrexate 2.5 mg four tabs every seven days.  6. Ultracet one to two every six to eight hours p.r.n. pain.  7. Folic acid 1 mg daily.   PAST MEDICAL HISTORY:  1. Hypertension.  2. Reflux disease.  3. Occasional vertigo.  4. Non-insulin-dependent diabetes mellitus.  5.  Osteoarthritis.   PAST SURGICAL HISTORY:  1. Cholecystectomy in the 1970s.  2. Hysterectomy in the 1990s.  3. Breast reduction in the 1990s.  4. She has had a right knee scope.  5. Tonsillectomy.   SOCIAL HISTORY:  Single.  She works as a Media planner.  Nonsmoker.  No  alcohol.  Family will be assisting with her care after surgery.   FAMILY HISTORY:  Mother, age 70, with a history of arthritis and  hypertension and diabetes.  She has a brother, age 79, with heart disease  and diabetes.  Two sisters, ages 6 and 80, with diabetes.  Father is  deceased at age 87 with a history of hypertension.   REVIEW OF SYSTEMS:  GENERAL:  No fevers, chills, night sweats.  NEURO:  No  seizure, syncope, paralysis.  She does have some occasional bouts of  vertigo, none recently.  RESPIRATORY:  No shortness of breath, productive  cough, or hemoptysis.  CARDIOVASCULAR:  Occasional shortness of breath on  exertion or when she is lying down flat.  She denies any shortness of breath  at rest.  No chest pain or angina.  She does have a history of hypertension.  GI:  No nausea, vomiting, diarrhea, or constipation.  GU:  No dysuria,  hematuria, discharge.  MUSCULOSKELETAL:  Significant for that of the right  knee pain with pain and swelling and stiffness.   PHYSICAL EXAMINATION:  VITAL SIGNS:  Pulse 68, respiratory rate 14, blood  pressure 170/100.  GENERAL:  Patient is a 65 year old African-American female, short stature,  slightly overweight.  She is alert and oriented and cooperative, very  pleasant at the time of exam, appears to be an excellent historian.  HEENT:  Normocephalic.  Atraumatic.  Pupils are round and reactive.  EOMs  are intact.  Oropharynx is clear.  NECK:  Supple.  CHEST:  Clear to auscultation, anterior and posterior chest walls.  HEART:  Regular rate and rhythm.  No murmurs.  S1 and S2 noted.  ABDOMEN:  Slightly round abdomen.  Nontender.  Bowel sounds are present.  RECTAL,  BREASTS, GENITALIA:  Not done, not pertinent to present illness.  EXTREMITIES:  Significant to the right knee, she has range of motion of 5 to  about 95 degrees of flexion.  There is no instability noted.  No effusion.  Motor function is intact.  Crepitus is noted.   IMPRESSION:  1. Bilateral knees osteoarthritis, right more symptomatic than left.  2. Hypertension.  3. Reflux disease.  4. Non-insulin-dependent diabetes mellitus.   PLAN:  Patient will be admitted to [facility] and undergo a right  total knee replacement and arthroplasty; surgery will be performed by Dr.  Ollen Gross.  Patient's medical doctor is Dr. Alroy Dust; Dr. Kriste Basque will  be notified of the room number on admission and will be consulted if needed  for any medical assistance with this patient throughout the hospital course.     Alexzandrew L. Julien Girt, P.A.              Ollen Gross, M.D.    ALP/MEDQ  D:  12/26/2002  T:  12/26/2002  Job:  [id number]   cc:   Ollen Gross, M.D.  [address]  [place]  [place] [address]  Fax: [phone]   Lonzo Cloud. Kriste Basque, M.D. [facility]

## 2010-12-18 NOTE — Assessment & Plan Note (Signed)
[facility]                              CARDIOLOGY OFFICE NOTE   NAME:[name], [name]                   MRN:          [id number]  DATE:02/22/2006                            DOB:          June 22, 1946    Ms. [name] returns today to discuss the findings of her studies.  All of  her studies have been done for further evaluation and treatment of her  dyspnea on exertion, hypertension.   Her cardiac cath showed nonobstructive disease.  An echocardiogram showed  normal left ventricular function, EF 65%.  She had mild aortic valve  stenosis, mean gradient of 7.5 mmHg.  She has mild left atrial dilatation.  Carotid Dopplers showed nonobstructive plaque and antegrade flow in both  vertebral arteries.  Her chemistries are stable.  BNP was 87, within normal  range.  Estimated GFR was 46 mL/minute.  A previous CT scan had shown a  questionable lesion in the liver.  MRI was recommended.  Study on February 04, 2006, read by Dr. 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  The inferior pole of the  right kidney showed a cyst as well, about 1 cm.  A followup MRI was  suggested for this renal cyst in 6 months.  We have made this clear to the  patient today, and given her a prescription to set this up as a reminder.   Her blood pressures still run in the 140s, but it is a lot better than it  was in the past.  I have reviewed her regimen today, and I do not think  there is any reason to make any further changes at present.  I have  encouraged her to lose weight and to keep her blood sugars tightly  controlled.  [facility] is engaged in the [facility] and Nutrition  at [facility].   PHYSICAL EXAMINATION:  Her blood pressure today is 141/68, pulse 67 and  regular, weight is 228.  The rest of her exam is unchanged.   After a thorough investigation, I see no structural heart problem that would  explain  her dyspnea on exertion.  I think much of this is due to her  hypertension, obesity and general deconditioning.  She agrees with this.   We will have her get a followup MRI in 6 months, as suggested above.  We  will see her back in the Cardiology office on a p.r.n. basis.  I have asked  her to follow up with Dr. Kriste Basque as scheduled.                               Thomas C. Daleen Squibb, MD, [facility]    TCW/MedQ  DD:  02/22/2006  DT:  02/23/2006  Job #:  [id number]   cc:   Lonzo Cloud. Kriste Basque, MD

## 2010-12-18 NOTE — Procedures (Signed)
[name], [name]            ACCOUNT NO.:  [id number]   MEDICAL RECORD NO.:  [id number]           PATIENT TYPE:   LOCATION:  SLEEP CENTER                 FACILITY:  [facility]   PHYSICIAN:  Coralyn Helling, MD        DATE OF BIRTH:  [birth year]   DATE OF STUDY:  09/02/2006                            NOCTURNAL POLYSOMNOGRAM   REFERRING PHYSICIAN:  Coralyn Helling, M.D.   INDICATIONS FOR STUDY:  This is an individual who had undergone an  overnight polysomnogram on June 10, 2006 which showed an apnea  hypopnea index of 21 and oxygen saturation nadir of 62%.  She returns to  the sleep lab for a CPAP titration study.   MEDICATIONS:  Metformin, Lasix, glimepiride, Norvasc, Nexium, Diovan,  Lyrica, Tramadol, Januvia, and Lipitor, as well as ferrous sulfate,  aspirin, ibuprofen, Advair, Pro Air HFA, and Humira.   SLEEP ARCHITECTURE:  Total recording time was 373 minutes, total sleep  time was 255 minutes.  Sleep efficiency was 95%.  Sleep latency was five  minutes which is reduced.  REM latency is 54 minutes which was slightly  reduced.  The study was notable for the lack of slow wave sleep.  The  patient slept in both the supine and non supine position.   RESPIRATORY DATA:  Average respiratory was 14.  The patient was titrated  from a CPAP respiratory setting of 5-12 cm of water.  At a CPAP pressure  setting of 12 cm of water the apnea/hypopnea was reduced to 1.8.  At  this pressure setting the patient was observed in both REM sleep and  supine sleep and snoring was eliminated.   ACTION DATA:  The baseline oxygen saturation was 98%.  The oxygen  saturation nadir was 77%.  At a CPAP pressure setting of 12 cm of water  the mean oxygenation during non REM sleep was 95% and during REM sleep  was 93%; The minimal oxygenation during non REM sleep was 82% and during  REM sleep was 77%.   CARDIAC DATA:  The average heart rate was 76 and rhythm strip showed  normal sinus rhythm.   MOVEMENT  PARASOMNIA:  The periodic limb movement index was 16.  The  patient had one bathroom trip.   IMPRESSION:  At a CPAP pressure setting of 12 cm of water the  apnea/hypopnea index was reduced to 1.8.  At this pressure setting the  patient was observed in both REM sleep and supine sleep, sleep  architecture and oxygenation were stabilized and snoring was eliminated.   The periodic limb movement index was elevated to 16.  Clinical  correlation will be necessary to determine the significance of this.      Coralyn Helling, MD  Diplomat, American Board of Sleep Medicine  Electronically Signed     VS/MEDQ  D:  09/20/2006 15:34:54  T:  09/20/2006 23:57:14  Job:  [id number]

## 2010-12-18 NOTE — Assessment & Plan Note (Signed)
[facility]                             PULMONARY OFFICE NOTE   NAME:RALEIGHLetisha, Yera                   MRN:          [id number]  DATE:07/20/2006                            DOB:          [birth year]    REFERRING PHYSICIAN:  Lonzo Cloud. Kriste Basque, MD   I saw Ms. Kobashigawa today for evaluation of her sleep difficulties.   She had undergone an overnight polysomnogram on June 10, 2006 and  this showed moderate obstructive sleep apnea with an apnea/hypopnea  index of 21 and an oxygen saturation nadir of 62% and she had a  significant REM effect.  She was started empirically on CPAP of 8 with  nasal pillows and heated humidifier.  She was also apparently using a  chin strap.  She says that she is having difficulty tolerating the mask  and feels that it is pushing on her nose too much.  Additionally, she is  still having leaking of air out of her mouth, although from her  description, it sounds like she is putting the chin strap around the  bottom of her chin instead of around the front part of her chin.  She  says she has had problems with her sleep for the last one year or so.  She would attempt to go to bed between 9 and 10 o'clock at night,  although she is usually falling asleep before this watching TV.  She is  also falling asleep while eating and also while driving.  She falls  asleep fairly quickly but wakes up four to five times during the course  of the night.  She says she will often times wake up feeling like she  cannot get enough air in as well as having a choking sensation.  She  describes herself as a restless sleeper.  She does snore and has been  told that she stops breathing while she is asleep at night.  She will  also tend to breathe through her mouth at night.  She wakes up at about  5 o'clock in the morning to get ready to go to work but will tend to  sleep in somewhat on the weekends.  She says that when she wakes in the  morning she  still feels quite tired.  She is not currently using  anything to help her fall asleep at night or stay awake during the day.  She denies any symptoms of sleep walking, sleep talking, nightmares and  night terrors.  She denies bruxism.  She denies any symptoms of sleep  hallucination, sleep paralysis or cataplexy.  She does occasionally get  cramps in her legs at night as well as an aching feeling in her legs at  night but has a history of diabetic neuropathy.  She had gained  approximately 30 pounds but then was able to lose approximately 25  pounds over the last one year.   PAST MEDICAL HISTORY:  1. Otherwise significant for hypertension.  2. Irritable bowel syndrome.  3. Gastroesophageal reflux disease.  4. Arthritis.  5. Asthma.  6. Diabetes with diabetic neuropathy.  7. Chronic allergies.  8. Psoriasis  9. Sleep apnea.  10.Heart murmur.   PAST SURGICAL HISTORY:  1. Significant for a cholecystectomy.  2. Hysterectomy.  3. Breast reduction.  4. Appendectomy.  5. Tonsillectomy.  6. Right knee replacement.   CURRENT MEDICATIONS:  1. Advair 250/50 one puff b.i.d.  2. Metformin 500 mg b.i.d.  3. Mucinex DM 1-2 daily.  4. Lasix 40 mg daily.  5. Diovan 320 mg daily.  6. Lyrica 100 mg b.i.d.  7. Lipitor 40 mg q.h.s.  8. Aspirin 81 mg daily.  9. Norvasc 10 mg daily.  10.Glimepiride 4 mg daily.  11.Nexium 40 mg daily.  12.Humira injection every other week.  13.Feosol 125 mg daily.  14.Albuterol p.r.n.  15.Zantac p.r.n.   ALLERGIES:  She has an allergy to LATEX.   SOCIAL HISTORY:  She is single.  She does not have any children.  She  works as a Media planner.  There is no history of tobacco or alcohol  use.   FAMILY HISTORY:  Significant for a father who had a arthritis, heart  failure and asthma.  Her mother had diabetes and arthritis.  She has one  brother with gout and hypertension.  Another brother with coronary  artery disease and diabetes.  She has one  sister with renal cancer and  hypertension and another sister with hypertension and anemia.   REVIEW OF SYSTEMS:  She complains of stiffness and pain in her knees as  well as a burning sensation in her legs.   PHYSICAL EXAMINATION:  She is 5 feet 2 inches, 213 pounds.  Temperature  is 98.2, blood pressure is 140/78, heart rate is 68, oxygen saturation  is 90% on room air.  HEENT:  Pupils are reactive.  There are no signs or symptoms.  No nasal  discharge.  She has a Mallampatti 3 airway with decreasing AP diameter  of the oropharynx and a large tongue.  She also wears dentures.  There  is no lymphadenopathy and no thyromegaly.  Heart was S1-S2, regular rate  and rhythm.  Chest was clear to auscultation.  Abdomen was obese, soft  and nontender.  EXTREMITIES:  There was no edema, cyanosis or clubbing.  NEUROLOGICAL EXAM:  No focal deficits were appreciated.   IMPRESSION:  Moderate to severe obstructive sleep apnea:  I had reviewed  the results of her sleep study with her.  I had discussed the diagnosis  of sleep apnea.  I had also detailed the adverse consequences of  untreated sleep apnea including increase risk of hypertension, coronary  artery disease, cerebrovascular disease and diabetes.  I have discussed  with her the importance of diet, exercise and weight reduction as well  as avoidance of alcohol and sedatives.  Driving precautions were  discussed with her as well.  I have reviewed various treatment options  for her sleep apnea including CPAP therapy, oral appliance and surgical  intervention.  Given the severity of her sleep apnea, I feel that CPAP  therapy would be her best option and to help determine what her optimal  pressure settings will be, I will make arrangements for her to have a  CPAP titration study.  In the meantime, I have also discussed with her  techniques to insure proper fitting of her mask as well as chin strap. If she is still have difficulty with air leak  after these adjustments,  then she may benefit from the use of a full-face mask, but determination  of this will be made after  review of her CPAP titration study.   I will follow up with her in approximately six to eight weeks.     Coralyn Helling, MD  Electronically Signed    VS/MedQ  DD: 07/20/2006  DT: 07/20/2006  Job #: [id number]   cc:   Lonzo Cloud. Kriste Basque, MD

## 2010-12-18 NOTE — Cardiovascular Report (Signed)
[name], [name]            ACCOUNT NO.:  [id number]   MEDICAL RECORD NO.:  [id number]          PATIENT TYPE:  OIB   LOCATION:  1966                         FACILITY:  [facility]   PHYSICIAN:  Arturo Morton. Riley Kill, M.D. [facility] OF BIRTH:  [birth year]   DATE OF PROCEDURE:  01/04/2006  DATE OF DISCHARGE:  01/04/2006                              CARDIAC CATHETERIZATION   INDICATIONS:  [name] is a 65 year old who has had some chest pain and  cough.  The cough has gotten progressive over the past 2 months.  She was  seen by Dr. Daleen Squibb in consultation and referred for cardiac catheterization.  CT scan is also planned.   PROCEDURE:  1.  Left heart catheterization.  2.  Selective coronary arteriography.  3.  Hand injection into the left ventricle.  4.  Brief hand injection into the proximal aortic root.   DESCRIPTION OF PROCEDURE:  The patient was brought to the cath lab and  prepped and draped in usual fashion.  Through an anterior puncture, the  right femoral artery was easily entered and a 4-French sheath placed.  We  then took views of the left and right coronary arteries.  Following this, we  did hand injections in both the LV and proximal aortic root.  This was done  in order to reduce contrast load with a BUN of 27.  She tolerated the  procedure without complications.  She did become hypertensive and did  receive intravenous hydralazine to bring the blood pressure down at the  completion of the procedure.  There were no major complications.   HEMODYNAMIC DATA:  1.  Central aortic pressure was 165/76.  2.  Left ventricular pressure 192/22.  3.  No gradient pullback across the aortic valve.   ANGIOGRAPHIC DATA:  1.  The left main coronary artery is free of significant disease.  2.  The left anterior descending artery courses to the apex.  There may be      very mild luminal irregularity of the major diagonal branch at the      ostium, but there does not appear to be high-grade  focal obstruction.      The distal LAD wraps to the apical tip, but does not wrap the apex.  It      does bifurcate.  Again, there does not appear to be critical obstruction      in the LAD system.  3.  The circumflex provides a tiny first marginal branch that has ostial      narrowing but is only about a 1 mm size vessel.  There is slow flow in      this, but is an insignificant artery.  4.  There are two significant marginal branches.  The marginal branches,      which represents the OM-2, is small to moderate in size and without      critical obstruction.  The third marginal branch bifurcates distally,      has mild luminal irregularity and is without significant disease.  5.  The right coronary artery is a large-caliber vessel providing a  posterior descending posterolateral branch and is smooth without      significant focal narrowing.  6.  Ventriculography in the RAO projection reveals preserved global systolic      function.  Only a hand injection was done, so there was not complete      opacification but there did not appear to be substantial reduction in      overall LV function.  7.  The aortic root did not appear to be dilated, although a full injection      was not done.  There were no major complications.   CONCLUSIONS:  1.  Well-preserved overall left ventricular function.  2.  Tiny first marginal branch with probably 70-90% ostial stenosis, but the      vessel is extremely small and insignificant.  3.  No critical obstruction of any of the major coronary vessels.   DISPOSITION:  The patient will follow-up with Dr. Daleen Squibb and Dr. Kriste Basque.  She  likely needs a full pulmonary evaluation.  A 2-D echo would also be helpful.   ADDENDUM:  Left heart catheterization was ordered.      Arturo Morton. Riley Kill, M.D. [facility]  Electronically Signed     TDS/MEDQ  D:  01/04/2006  T:  01/04/2006  Job:  [id number]   cc:   Thomas C. Wall, M.D.  [address]  [address]  [place]  [place]  [address]   Lonzo Cloud. Kriste Basque, M.D. [facility]  [address]  [place]  [place] [address]   [facility]

## 2010-12-18 NOTE — Discharge Summary (Signed)
[name], [name]            ACCOUNT NO.:  [id number]   MEDICAL RECORD NO.:  [id number]         PATIENT TYPE:  INP   LOCATION:  1430                         FACILITY:  [facility]   PHYSICIAN:  Lonzo Cloud. Kriste Basque, MD     DATE OF BIRTH:  09-Aug-1945   DATE OF ADMISSION:  04/12/2006  DATE OF DISCHARGE:  04/15/2006                                 DISCHARGE SUMMARY   FINAL DIAGNOSES:  1. Admitted 04/12/2006 via the ER with a syncopal spell.  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  Plan per cardiology is to observe on her regular      medications, and if any recurrent episodes occur, she will have an      outpatient event recorder.  No arrhythmias identified this      hospitalization.  2. Non-obstructive coronary artery disease with catheterization in June of      this year revealing luminal irregularities and a 70% ostial stenosis in      the first marginal branch (this was a very small vessel).  3. Mild aortic stenosis on 2-D echo with 7-mm gradient and normal left      ventricular function.  4. Diabetes with good control on oral hypoglycemics.  The patient,      however, has not been following an appropriate diet and continues to      drink regular sodas, etc.  She is morbidly obese and was extensively      diet-counseled during this hospitalization.  5. Hypercholesterolemia on Lipitor.  6. History of dyspnea and obesity with a restrictive component - the      patient takes Advair and Mucinex.  7. History of degenerative arthritis with previous total knee replacement.  8. History of neuropathy on Lyrica.  9. History of gastroesophageal reflux disease on Prilosec.  10.History of diverticulosis with colonoscopy in 2000 revealing      diverticula hemorrhoids but no polyps.  11.Status post cholecystectomy, hysterectomy and breast reduction surgery.  12.Known cyst on her liver and kidneys, seen on previous CT and MRI.  13.History of psoriasis on methotrexate from Dr. Jorja Loa.  14.History of anemia with hemoglobin in the 10 range.   BRIEF HISTORY AND PHYSICAL:  The patient is a 65 year old black female with  multiple medical problems as noted above.  She presented to the emergency  room on 04/12/2006 with a syncopal spell that occurred at work.  She states  that she was sitting at her desk and doing her work, and the next thing she  knew, everyone was around her and they called 9-1-1.  She denies aura or  premonition.  She denied any seizure or postictal state.  She denied any  chest pain, dyspnea or palpitations.  She denied any recent low-sugar  events.  She apparently was not postural and denied any nausea, vomiting or  weakness.  Her only other recent symptom has been some increase in edema.   In the emergency room, she was evaluated by Dr. Deretha Emory of the emergency  room staff.  This was a  negative evaluation with stable vital signs, normal  rhythm on the monitor and no acute EKG changes.  She was referred for  further evaluation of syncope.   PAST MEDICAL HISTORY:  As noted, she has a multiple medical problems  including diabetes and obesity, and she has been on three oral agents plus  Lantus insulin.  She had received diet counseling at the [facility].  However, unfortunately she continues to consume 3-4 regular sodas per day.  As noted, she is morbidly obese and has not been exercising.  She has known  non-obstructive coronary disease with evaluation this summer by Dr. Daleen Squibb.  She had a catheterization in June 2007 that showed some luminal  irregularities and a 70% ostial stenosis in the first marginal branch, but  this was a very small vessel.  She was placed on medical management.  She  also has mild aortic stenosis with a 2-D echo in June of this year that  showed normal LV function and a 7-mm aortic gradient.  She had carotid  Dopplers at that time as well, which were normal.  She has   hypercholesterolemia and has been taking Lipitor 40.  Her last cholesterol  was 114, HDL 28, LDL 69, triglycerides 85.  She has a history of dyspnea  believed related to her obesity and some question of airways disease.  She  has been on Advair and Mucinex.  She has degenerative arthritis and a  previous total knee replacement.  She has a history of a neuropathy and has  been on Lyrica.  She has gastroesophageal reflux disease and takes Prilosec.  She has diverticulosis of the colon in 2000 that showed some divertics and  hemorrhoids but no polyps.  She has had a previous cholecystectomy,  hysterectomy and breast reduction operation.  She has cysts in the liver and  kidneys seen on previous CTs.  She has psoriasis treated by Dr. Jorja Loa with  methotrexate.  She has chronic anemia with hemoglobin in the 10 range.  She  is intolerant to ACE inhibitors with a history of cough.   PHYSICAL EXAMINATION:  Physical examination at time of admission revealed an  obese 65 year old black female in no acute distress.  Blood pressure 160/80  without postural changes, pulse 78 per minute and regular, respirations 20  per minute and not labored, temperature 98 degrees.  HEENT exam revealed a  slight strabismus but no other abnormalities seen.  Neck exam was short and  obese without JVD or bruits and no thyromegaly appreciated.  Chest exam was  clear to percussion and auscultation.  The cardiac exam revealed a regular  rhythm, grade 1/6 systolic ejection murmur, left sternal border without  radiation.  No murmurs or gallops heard.  The abdomen was obese but soft and  nontender without evidence of organomegaly or masses.  Extremities showed  some venous insufficiency, 1+ edema, degenerative arthritis with previous  total knee operation as noted.  Neurologic was intact without focal  abnormalities detected.  Skin exam showed psoriasis as noted.  LABORATORY DATA:  EKG showed normal sinus rhythm.  It was felt  to be within  normal limits.  Cardiac monitoring was done throughout her hospitalization,  and she had no significant cardiac arrhythmias noted.  Chest X-Ray:  Showed  mild cardiomegaly, clear lungs, no effusion.  MRI of the brain showed no  acute intracranial process.  MRA showed moderate atherosclerotic changes in  the intracranial vessels, most notably in the proximal ACA segments, as  well  as a left cavernous carotid.  Previous carotid Dopplers showed no  extracranial carotid disease.  A 2-D echo was performed that showed overall  normal left ventricular systolic function with no regional wall motion  abnormalities and mild calcification of aortic valve.  Hemoglobin 11.3,  hematocrit 32.4, white count 5,300 with a normal differential.  Stool for  occult blood negative.  Pro-time 13.4, INR 1.0, PTT 31 seconds.  D-dimer  normal.  Sodium 141, potassium 3.7, chloride 106, CO2 30, BUN 22, creatinine  1.2, blood sugar 82, calcium 9.7, total protein 6.3, albumin 3.80, AST 89,  ALT 19, alk phos 72, total bilirubin 0.9, hemoglobin A1c 5.8, CPK negative  with negative MB, TSH 4.7.  Serum iron 43, TIBC 313 for saturation of 14%.   HOSPITAL COURSE:  The patient was admitted with syncope for evaluation.  These proceeded along two lines with a neurological evaluation and cardiac  evaluation.  The neurological evaluation included MRI of the brain and an  EEG.  The MRI indicated no acute intracranial process, and the MRA showed  some intracranial atherosclerotic changes.  The patient has been on aspirin  and will continue this.  The EEG result is pending at the time of discharge.  She had no further syncopal or dizzy spells during the hospitalization.  Her  cardiac workup included  a cardiac consultation from Dr. Dietrich Pates or Dr.  Daleen Squibb.  She had continuous monitoring during the hospital stay and ambulated  on the ward without any arrhythmias.  EKG was normal and 2-D echo as above.  They felt she most  likely had a form of neurocardiogenic syncope and did not  feel further evaluation was warranted at the present time.  They did  indicate, however, that if she had any recurrent episodes that an event  monitor would be performed as an outpatient.   As concerns the patient's diabetes, it became apparent that she was not  following the needed diabetic diet.  She was drinking regular sodas to  excess and not exercising.  She was seen by the diabetic nurse specialist  and dieticians and carefully admonished to avoid all sugars, sweets, pasta  and bread.  She was placed on a calorie-restricted diet to aid weight  reduction.  She voiced understanding of these dietary recommendations and  states she will do better in the future.  As a result of better diet  compliance in the hospital, we were able to discontinue her Lantus and  adjust her diabetic meds and hopefully make it easier for her to lose weight  in the future.  MEDICATIONS AT DISCHARGE:  1. Aspirin daily.  2. Metformin 1000 mg p.o. b.i.d.  3. Actos 45 mg p.o. daily.  4. Glimepiride 4 mg p.o. q.a.m.  5. Lyrica 100 mg p.o. b.i.d.  6. Prilosec 20 mg p.o. daily.  7. Diovan 320 one tablet p.o. daily.  8. Norvasc 10 mg p.o. daily.  9. Lasix 40 mg p.o. q.a.m.  10.Lipitor 40 mg p.o. nightly.  11.Advair 250/50 1 puff b.i.d.  12.Tylenol as needed.   The patient will stop her previous Lantus and follow the diet and exercise  recommendations.  She will follow up in the office with Dr. Kriste Basque in 1 week  on April 27, 2006 at 1:45 p.m..   CONDITION ON DISCHARGE:  Improved.      Lonzo Cloud. Kriste Basque, MD  Electronically Signed     SMN/MEDQ  D:  04/16/2006  T:  04/18/2006  Job:  [id number]   cc:   Chart

## 2010-12-22 ENCOUNTER — Other Ambulatory Visit: Payer: Self-pay | Admitting: Pulmonary Disease

## 2010-12-29 ENCOUNTER — Other Ambulatory Visit: Payer: Self-pay | Admitting: Pulmonary Disease

## 2011-01-19 ENCOUNTER — Ambulatory Visit: Payer: BC Managed Care – PPO | Admitting: Pulmonary Disease

## 2011-02-26 ENCOUNTER — Other Ambulatory Visit: Payer: Self-pay | Admitting: Pulmonary Disease

## 2011-02-26 ENCOUNTER — Other Ambulatory Visit: Payer: Self-pay | Admitting: *Deleted

## 2011-02-26 MED ORDER — PREGABALIN 100 MG PO CAPS: 100.0000 mg | ORAL_CAPSULE | Freq: Every day | ORAL | Status: DC

## 2011-03-01 ENCOUNTER — Other Ambulatory Visit (INDEPENDENT_AMBULATORY_CARE_PROVIDER_SITE_OTHER): Payer: BC Managed Care – PPO

## 2011-03-01 ENCOUNTER — Ambulatory Visit (INDEPENDENT_AMBULATORY_CARE_PROVIDER_SITE_OTHER): Payer: BC Managed Care – PPO | Admitting: Pulmonary Disease

## 2011-03-01 ENCOUNTER — Ambulatory Visit (INDEPENDENT_AMBULATORY_CARE_PROVIDER_SITE_OTHER)
Admission: RE | Admit: 2011-03-01 | Discharge: 2011-03-01 | Disposition: A | Discharge status: Home / Self Care | Payer: BC Managed Care – PPO | Source: Ambulatory Visit | Attending: Pulmonary Disease | Admitting: Pulmonary Disease

## 2011-03-01 ENCOUNTER — Encounter: Payer: Self-pay | Admitting: Pulmonary Disease

## 2011-03-01 DIAGNOSIS — N259 Disorder resulting from impaired renal tubular function, unspecified: Secondary | ICD-10-CM

## 2011-03-01 DIAGNOSIS — I2510 Atherosclerotic heart disease of native coronary artery without angina pectoris: Secondary | ICD-10-CM

## 2011-03-01 DIAGNOSIS — D649 Anemia, unspecified: Secondary | ICD-10-CM

## 2011-03-01 DIAGNOSIS — K219 Gastro-esophageal reflux disease without esophagitis: Secondary | ICD-10-CM

## 2011-03-01 DIAGNOSIS — I10 Essential (primary) hypertension: Secondary | ICD-10-CM

## 2011-03-01 DIAGNOSIS — E7800 Pure hypercholesterolemia, unspecified: Secondary | ICD-10-CM

## 2011-03-01 DIAGNOSIS — I679 Cerebrovascular disease, unspecified: Secondary | ICD-10-CM

## 2011-03-01 DIAGNOSIS — M1990 Unspecified osteoarthritis, unspecified site: Secondary | ICD-10-CM

## 2011-03-01 DIAGNOSIS — D472 Monoclonal gammopathy: Secondary | ICD-10-CM

## 2011-03-01 DIAGNOSIS — K5730 Diverticulosis of large intestine without perforation or abscess without bleeding: Secondary | ICD-10-CM

## 2011-03-01 DIAGNOSIS — E119 Type 2 diabetes mellitus without complications: Secondary | ICD-10-CM

## 2011-03-01 DIAGNOSIS — J209 Acute bronchitis, unspecified: Secondary | ICD-10-CM

## 2011-03-01 DIAGNOSIS — L408 Other psoriasis: Secondary | ICD-10-CM

## 2011-03-01 DIAGNOSIS — G4733 Obstructive sleep apnea (adult) (pediatric): Secondary | ICD-10-CM

## 2011-03-01 DIAGNOSIS — F411 Generalized anxiety disorder: Secondary | ICD-10-CM

## 2011-03-01 LAB — BASIC METABOLIC PANEL
CO2: 29 mEq/L (ref 19–32)
Calcium: 12 mg/dL — ABNORMAL HIGH (ref 8.4–10.5)
Chloride: 101 mEq/L (ref 96–112)
Creatinine, Ser: 1.9 mg/dL — ABNORMAL HIGH (ref 0.4–1.2)
Sodium: 137 mEq/L (ref 135–145)

## 2011-03-01 LAB — HEPATIC FUNCTION PANEL
ALT: 32 U/L (ref 0–35)
Alkaline Phosphatase: 176 U/L — ABNORMAL HIGH (ref 39–117)
Bilirubin, Direct: 0.1 mg/dL (ref 0.0–0.3)
Total Bilirubin: 0.6 mg/dL (ref 0.3–1.2)
Total Protein: 7.4 g/dL (ref 6.0–8.3)

## 2011-03-01 LAB — CBC WITH DIFFERENTIAL/PLATELET
Basophils Absolute: 0 10*3/uL (ref 0.0–0.1)
Basophils Relative: 0.2 % (ref 0.0–3.0)
Eosinophils Absolute: 0.5 10*3/uL (ref 0.0–0.7)
Hemoglobin: 12.8 g/dL (ref 12.0–15.0)
MCHC: 30.7 g/dL (ref 30.0–36.0)
MCV: 89.8 fl (ref 78.0–100.0)
Monocytes Absolute: 1.1 10*3/uL — ABNORMAL HIGH (ref 0.1–1.0)
Neutro Abs: 5.5 10*3/uL (ref 1.4–7.7)
RBC: 4.63 Mil/uL (ref 3.87–5.11)
RDW: 17.8 % — ABNORMAL HIGH (ref 11.5–14.6)

## 2011-03-01 MED ORDER — LORAZEPAM 1 MG PO TABS: ORAL_TABLET | ORAL | Status: DC

## 2011-03-01 NOTE — Patient Instructions (Signed)
Today we updated your med list in EPIC...    We wrote a new prescription for LORAZEPAM 1mg  to try 1/2 to 1 tab up to 3 times daily as needed for nerves or sleep...  For your Bronchitis:      Drink lots of fluids/ water & try the OTC MUCINEX DM 1-2 tabs twice daily...    We gave you 5d of AVELOX 400mg - take one daily til gone...    Continue your Advair inhaler etc...  For your constipation:    Take the MIRALAX OTC 1 capful in water TWICE daily...     And add the OTC SENAKOT-S 2 tabs by mouth at bedtime...    You may also use Milk of Mag as needed whenever needed...  For your DM:    Let's get on track w/ our low carb, no sweets diet & increase the exercise.   Continue the Lemimir & Amaryl the same for now...  Today we did your follow up CXR & blood work...    Please call the PHONE [facility]REE in a few days for your results...    Dial [id number] & when prompted enter your patient number followed by the # symbol...    Your patient number is:  [id number]#  Call for any questions...  Let's plan a follow up visit in 3-4 months, sooner if needed for problems.

## 2011-03-01 NOTE — Progress Notes (Signed)
Subjective:    Patient ID: [name], female    DOB: [birth year], 65 y.o.   MRN: [id number]  HPI 65 y/o BF here for a follow up visit... he has multiple medical problems as noted below...    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 MRI of the Abd showed splenomegaly & numerous ~1cm lesions throughout ?etiology...  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 she was also seen by DrFox for Nephrology & he concurred w/ the renal insuffic eval but on recheck of her labs- Calcium was 13.7 (?etiology- review of all prev labs showed normal calcium levels in the 9-10 range, last checked 04/28/10= 10.1) & he sent her to [facility] where she was adm by the Hospitalists...  extensive eval in the hosp by TH & DrGranfortuna failed to confirm an etiology for the Hypercalcemia (Ca= 12.8 --> 11.2 at disch after Rx)- the consensus was poss Sarcoid given the splenic lesions & no other explanation (ACE was not checked) & she was started on Pred20mg /d & disch home... DrG did bone marrow & final path was neg- no pathologic diagnosis in bone marrow...  Since disch she is confused by all this & feeling about the same> now w/ predom GI manifestations of abd discomfort, nausea, +reflux, no vomiting, constip, weakness, etc... we discussed incr Nexium to 40mg Bid & ask DrPerry to review her situation & his thoughts re her splenic abn as well... f/u LABS TODAY> BS=368;  BUN=32, Creat=2.2;  Ca=12.6;  Hg=10.5, ACE=63 (8-52)...  ~   November 24, 2010:  [name], no evid malignancy;  Post-op her calcium level returned to normal w/o further intervention;  She had several post-op complications including a subphrenic abscess that required percutaneous drainage, and a left pleural effusion/ basilar atelectasis (s/p thoracentesis & improved)... She has improved slowly at home w/ family help & is planning return to work 11/30/10;  She notes sl cough w/ sm amt thick beige sput, & SOB/ weakness is gradually better;  Denies CP etc, notes insomnia & anxiety...  CXR today w/ improved left base, no clear baseline film;  Labs show Hg=11.6, BS=117, Creat=1.6, FLP looks good, LFTs are OK x sl incr AlkPhos, but note Ca=11.1 (was 8.6 one month ago)...  ~  March 01, 2011:  28mo ROV & she presents w/ recent cough, congestion, green drainage, and tired/ SOB/ etc; she notes incr arthritis pain, aching/ sore and some constipation;  We discussed checking f/u CXR (clear, NAD); and Labs- CBC-ok, BS=167, A1c=8.4, BUN=44, Creat=1.9, Ca=12.0, Phos=4.0, AlkPhos=167, GGT=151, ACE=78;  We discussed Rx w/ Avelox, Mucinex, Fluids; and incr Miralax/ Senakot-S/ MOM for the constipation...    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    Renal Insuffic followed by DrFox for Nephrology> Creat up to 1.9 from 1.6 last, needs incr fluid intake, she knows to avoid nephrotoxic agents/ meds/ etc...    She has known MGUS followed by DrGranfortuna but prev bone marrow was neg etc; due for MGUS labs this fall, current CBC is stable: Hg=12.8, Fe=45...         Problem List:    OBSTRUCTIVE SLEEP APNEA - Sleep Study 11/07 showed RDI=21 w/  desat to 62% during REM.Marland Kitchen. eval by DrSood w/ Rx for CPAP 12... compliance poor- only using it 1-2 times/wk... encouraged to use CPAP more regularly & to f/u w/ DrSood regarding the mask interface problems... ~  8/11:  she reports new mask but still not using CPAP regularly, "I rest fairly well". ~  7/12:  She reports not using her CPAP, and not resting well...  BRONCHITIS, ACUTE - on ADVAIR 250Bid (using Prn only) & PROAIR as needed. 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 ~  CXR 1/09 was pre-op for TKR- sl cardomeg & ?mild vasc congestion. ~  CXR 2/11 showed norm heart size & vascularity, clear, s/p cholecystectomy, DJD in TSpine. ~  CXR & CT Chest 11/11 showed sl peribronch thickening, no lung lesions, no signif adenopathy. ~  PET scan 1/12 showed hypermetabolic activ in spleen & lymph nodes in the supraclav, hilar, mediastinal, periaotic & iliac region as well. ~  CXRs 3/12 in the perioperative period after splenectomy showed left effusion==> tapped & improved aeration. ~  CXR 4/12 back to baseline w/ clear bases, essent wnl... ~  CXR 7/12 showed norm heart size, clear lungs, no definite adenopathy, NAD... ~  LABS 7/12 w/ ACE=78 (8-52), Ca=12.0, AlkPhos=176 (39-117), GGT=151 (7-51)  HYPERTENSION - taking NORVASC 10mg /d,  DIOVAN 320mg /d, & off the prev diuretics due to leg cramps & renal insuffic... ~  4/12:  BP here 132/62 and she denies HA, visual changes, CP, palipit, dizziness, syncope, change in dyspnea, etc...  CAD - on ASA 81mg /d...  ~  Hx non-obstructive CAD w/ cath 6/07 by DrStuckey showing luminal irregularities & tiny first marginal branch of the CIRC w/ ostial lesion... good LVF. ~  2DEcho 9/07 showed mildly calcif AoV and normal LVF & wall motion. ~  2DEcho 11/11 showed norm LV wall thickness, norm LVF w/ EF= 60-65%, norm atria, norm valves, trivial peric fluid behind heart.  CEREBROVASCULAR DISEASE - she remains on ASA 81mg /d  without TIA's or other neuro manifestations...  ~  MRA Br 9/07 showed mod intracranial atherosclerotic changes...   HYPERLIPIDEMIA - on LIPITOR 20mg /d ~  FLP 7/07 showed TChol 114, TG 85, HDL 28, LDL 69 ~  FLP 4/09 showed TChol 154, TG 192, HDL 34, LDL 82... discussed poss of adding fibrate- hold... ~  FLP 2/10 on Lip20 showed TChol 159, TG 207, HDL 29, LDL 83... rec> add Fenoglide (she didn't). ~  FLP 2/11 on Lip20 showed TChol 185, TG 242, HDL 43, LDL 99... add FENOFIBRATE 160mg /d... ~  FLP 8/11 on Lip20+Feno160 showed TChol 167, TG 216, HDL 37, LDL 96... she stopped Feno160. ~  FLP 4/12 on Lip20 showed TChol 146, TG 115, HDL 35, LDL 88  DM - on LEVEMIR & GLIMEPIRIDE 4mg /d,  prev Metformin & Januvia were stopped during 11/11 hosp, ~  labs 4/08 showed BS=138 & HgA1c=7.5.Marland Kitchen. home BS = 100 to >300... misses doses and not on diet or exercising... ~  1/09 became hypoglycemic in hosp on 4 meds post knee surg- meds adjusted... ~  3/09 restarted GLIMEPIRIDE 4mg - 1/2 tab each AM, & continue Lantus & Metformin... ~  labs 4/09 showed BS= 148, HgA1c= 7.3.Marland Kitchen. rec> back on 4 med regimen due to poor control at home. ~  6/09 discussed titrating the Lantus up until FBS 100-120 range... ~  labs 2/10 showed BS= 164, HgA1c= 8.6.Marland KitchenMarland Kitchen very disappointing- needs home BS monitoring, incr Lantus. ~  labs 2/11 showed BS= 298, A1c= 8.4...  discussed change Lantus to LEVEMIR 40 u daily... ~  labs 8/11 showed BS= 202, A1c= 10.9.Marland Kitchen. rec> incr Levemir to 50, consider Humalog. ~  Nov-Dec 2011:  BS up w/ adjust of meds & addition of Pred after hosp 11/11 & hypercalcemia... ~  She states she returned to Levemir 40u daily after the splenectomy hosp due to appetite etc;  4/12 BS=117 ~  Labs 7/12 showed BS= 167, A1c= 8.4> on Levemir 50u/d & Amaryl 4mg  Qam (note creat 1.9).  OBESITY (ICD-278.00) - weight down to 171 after hosp 11/11- doing better on diet, not yet exercising. ~  weight 220-230# in the early 1990's... ~  weight  210-220# in the early 2000's... ~  weight 2/10 = 192# ~  weight 2/11 = 186# ~  weight 8/11 = 174# ~  weight 12/11 = 171# ~  Weight 4/12 = 168# ~  Weight 7/12 = 171#  HYPERCALCEMIA> this was most likely due to sarcoidosis w/ extensive splenic involvement;  Calcium was as hight as 13.7 and returned to normal after the splenectomy. ~  Labs 4/12 showed calcium = 11.1 & this is very disappointing... ~  Labs 7/12 showed Ca= 12.0, Phos= 4.0, ACE= 78  GERD - on NEXIUM 40mg  Bid... w/ increased reflux symptoms w/ noct cough etc...  ~  2/10: discussed optimal Rx w/ Nex before dinner, Zantac300 + Reglan10 at bed, elev HOB, etc. ~  2/11: she stopped the Reglan, still using Nexium/ Zantac but w/ persist symptoms>  refer to GI for eval. ~  6/11: GI f/u DrPerry w/ EGD that was normal... continue Rx. ~  12/11:  increased GI symptoms post hosp> she will f/u w/ DrPerry for his input> incr Nexium Bid.  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 ~  6/11:  f/u colonoscopy by DrPerry showed divertics, 4 polyps, AVM.Marland KitchenMarland Kitchen path= tubular adenoma, f/u 36yrs.  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  RENAL INSUFFICIENCY (ICD-588.9) >> Creat ~ 2.0 & eval 10/11 by DrFox- prob due to DM & HBP... ~  Labs 4/12 showed BUN= 27, Creat= 1.6 ~  Labs 7/12 showed BUN= 44, Creat= 1.9 (not on diuretics/ NSAIDs/ etc)...  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 on LYRICA 100mg  Qhs, off prev Tramadol Rx... ~  2010 eval by DrHiatt @ [facility] on Tramadol 50mg , Lyrica 75mg Bid...  GOUT, UNSPECIFIED (ICD-274.9) - Uric in the 7-8 range...on ALLOPURINOL 300mg /d for prevention...  ANXIETY (ICD-300.00) - she is under mod stress-  work, mother died, hospice counselling, etc... ~  8/11:  rec starting ALPRAZOLAM 0.5mg  Tid for palpit, SOB, anxiety...  ANEMIA-NOS & MGUS >> see eval 10-11/11 by DrGranfortuna w/ bone marrow 11/11 in hosp= neg. ~  Labs 4/12 showed Hg= 11.6, MCV= 84 ~  Labs 7/12 showed Hg= 12.8, MCV= 90, Fe= 45 (14%sat)...  PSORIASIS - eval and Rx per DrTafeen prev on HUMIRA injections- stopped 9/11...   Past Surgical History  Procedure Date  . Cholecystectomy   . Vesicovaginal fistula closure & Total Abdominal Hysterectomy   . Total Knee Replacements> Right 5/04, Left 1/09> by DrAlusio   . Tonsillectomy   . Appendectomy    Open Splenectomy 2/12 by DrRosenbower ==> extensive granulomatous inflamm.     Outpatient Encounter Prescriptions as of 03/01/2011  Medication Sig Dispense Refill  . ADVAIR DISKUS 250-50 MCG/DOSE AEPB inhale 1 dose by mouth twice a day  60 each  5  . albuterol (PROAIR HFA) 108 (90 BASE) MCG/ACT inhaler Inhale 2 puffs into the lungs every 6 (six) hours as needed.        Marland Kitchen allopurinol (ZYLOPRIM) 300 MG tablet take 1 tablet by mouth once daily  30 tablet  PRN  . amLODipine (NORVASC) 10 MG tablet take 1 tablet by mouth once daily  30 tablet  PRN  . aspirin 81 MG tablet Take 81 mg by mouth daily.        Marland Kitchen atorvastatin (LIPITOR) 20 MG tablet take 1 tablet by mouth once daily  30 tablet  PRN  . cloNIDine (CATAPRES) 0.1 MG tablet take 1 tablet by mouth twice a day  60 tablet  2  . ferrous sulfate 325 (65 FE) MG tablet take 1 tablet by mouth once daily WITH A MEAL  30 tablet  6  . fluticasone (FLONASE) 50 MCG/ACT nasal spray 2 sprays by Nasal route at bedtime.        Marland Kitchen glimepiride (AMARYL) 4 MG tablet Take 1 tablet (4 mg total) by mouth daily before breakfast.  30 tablet  6  . insulin detemir (LEVEMIR) 100 UNIT/ML injection Inject 50 Units into the skin daily.        Marland Kitchen ketorolac (ACULAR PF) 0.5 % ophthalmic solution Place 1 drop into both eyes 3 (three) times daily.        . metoprolol  tartrate (LOPRESSOR) 25 MG tablet take 1/2 tablet by mouth daily  30 tablet  2  . NEXIUM 40 MG capsule TAKE 1 CAPSULE BY MOUTH 2 TIMES DAILY BEFORE MEALS  30 capsule  1  . polyethylene glycol (MIRALAX / GLYCOLAX) packet Take 17 g by mouth daily.        . pregabalin (LYRICA) 100 MG capsule Take 1 capsule (100 mg total) by mouth at bedtime.  30 capsule  5  . valsartan (DIOVAN) 320 MG tablet Take 320 mg by mouth daily.          Allergies  Allergen Reactions  . Codeine   . Guaifenesin     REACTION: pt states Nausea \\T \ Vomiting  . Latex     REACTION: SWELLING  . Oxycodone     REACTION: N/V    Review of Systems         See HPI - all other systems neg except as noted... The patient complains of dyspnea on exertion.  The patient denies anorexia, fever, weight loss, weight gain, vision loss, decreased hearing, hoarseness, chest pain, syncope, peripheral edema, prolonged cough, headaches, hemoptysis, abdominal pain, melena, hematochezia, severe indigestion/heartburn, hematuria, incontinence, muscle weakness, suspicious skin lesions, transient blindness, difficulty walking, depression, unusual weight change, abnormal bleeding, enlarged lymph nodes, and angioedema.     Objective:   Physical Exam      WD, Overweight, 64 y/o BF in NAD...  GENERAL:  Alert & oriented; pleasant & cooperative... HEENT:  [place]/AT, EOM-wnl, PERRLA, EACs-clear, TMs-wnl, NOSE-clear, THROAT- clear & wnl... NECK:  Supple w/ fair ROM; no JVD; normal carotid impulses w/o bruits; palp thyroid, w/o nodules felt; no lymphadenopathy. CHEST:  Clear to P & A; without wheezes/ rales/ or rhonchi. HEART:  Regular Rhythm; gr 1/6 SEM, without rubs/ or gallops. ABDOMEN:  Scar from splenectomy surg; obese, soft, & nontender w/ panniculus; normal bowel sounds; no organomegaly or masses detected EXT: without deformities, mod arthritic changes, s/p bilat TKRs; no varicose veins/ +venous insuffic/ tr edema. NEURO:  CN's intact; no focal neuro  deficits... DERM:  Psoriasis rash per DrTafeen...   Assessment & Plan:   SARCOIDOSIS>  With elev ACE at 78 now and recurrent hypercalcemia> all likely due to sarcoid activity; note prev LNs were pos on the PET in 1/12; however AlkPhos & GGT are elev as well & we will need to re-image the liver (do MRI to compare to prev);  Treatment will almost certainly have to be Pred this time & we will need Endocrine help w/ her DM ==> refer to Endocrinology.  Hypercalcemia>  Labs today showed recurrent hypercalcemia w/ level = 12.0;  rec hydration, low calc diet/ no supplements, and we will ask Endocrinology to review case before starting her in Prednisone rx...  HBP/ CAD>  BP controlled and no angina etc...  DM>  Control has been difficult;  Stressed diet/ exercise, slowly incr Levemir dose, and continue the Amaryl for now, can't use Metformin w/ her renal dis;  We will discuss w/ Endocine/ DM prior to initiating Pred rx for sarcoid & hypercalcemia...  Renal Insuffic>  Creat is up to 1.9 & she is advised to incr fluid intake, avoid nephrotoxic meds etc...  Other medical problems as noted.Marland KitchenMarland Kitchen

## 2011-03-02 LAB — ANGIOTENSIN CONVERTING ENZYME: Angiotensin-Converting Enzyme: 78 U/L — ABNORMAL HIGH (ref 8–52)

## 2011-03-03 ENCOUNTER — Other Ambulatory Visit: Payer: Self-pay | Admitting: Pulmonary Disease

## 2011-03-03 DIAGNOSIS — N259 Disorder resulting from impaired renal tubular function, unspecified: Secondary | ICD-10-CM

## 2011-03-03 DIAGNOSIS — D472 Monoclonal gammopathy: Secondary | ICD-10-CM

## 2011-03-04 ENCOUNTER — Other Ambulatory Visit (INDEPENDENT_AMBULATORY_CARE_PROVIDER_SITE_OTHER): Payer: BC Managed Care – PPO

## 2011-03-04 DIAGNOSIS — D472 Monoclonal gammopathy: Secondary | ICD-10-CM

## 2011-03-04 DIAGNOSIS — N259 Disorder resulting from impaired renal tubular function, unspecified: Secondary | ICD-10-CM

## 2011-03-06 ENCOUNTER — Encounter: Payer: Self-pay | Admitting: Pulmonary Disease

## 2011-03-09 ENCOUNTER — Other Ambulatory Visit: Payer: Self-pay | Admitting: Pulmonary Disease

## 2011-03-09 DIAGNOSIS — D869 Sarcoidosis, unspecified: Secondary | ICD-10-CM

## 2011-03-13 ENCOUNTER — Ambulatory Visit (HOSPITAL_COMMUNITY)
Admission: RE | Admit: 2011-03-13 | Discharge: 2011-03-13 | Disposition: A | Discharge status: Home / Self Care | Payer: BC Managed Care – PPO | Source: Ambulatory Visit | Attending: Pulmonary Disease | Admitting: Pulmonary Disease

## 2011-03-13 DIAGNOSIS — Z9089 Acquired absence of other organs: Secondary | ICD-10-CM | POA: Insufficient documentation

## 2011-03-13 DIAGNOSIS — D869 Sarcoidosis, unspecified: Secondary | ICD-10-CM | POA: Insufficient documentation

## 2011-03-13 DIAGNOSIS — N281 Cyst of kidney, acquired: Secondary | ICD-10-CM | POA: Insufficient documentation

## 2011-03-13 DIAGNOSIS — K7689 Other specified diseases of liver: Secondary | ICD-10-CM | POA: Insufficient documentation

## 2011-03-13 DIAGNOSIS — R7989 Other specified abnormal findings of blood chemistry: Secondary | ICD-10-CM | POA: Insufficient documentation

## 2011-03-16 ENCOUNTER — Other Ambulatory Visit: Payer: Self-pay | Admitting: Pulmonary Disease

## 2011-03-24 ENCOUNTER — Other Ambulatory Visit: Payer: Self-pay | Admitting: Pulmonary Disease

## 2011-03-24 DIAGNOSIS — D7389 Other diseases of spleen: Secondary | ICD-10-CM

## 2011-04-02 ENCOUNTER — Telehealth: Payer: Self-pay | Admitting: Pulmonary Disease

## 2011-04-02 NOTE — Telephone Encounter (Signed)
[email or web address]  

## 2011-04-02 NOTE — Telephone Encounter (Signed)
I spoke with pt and she c/o hacking cough, with thick green phlem, chest is tight, chest congestion, feels stopped up, wheezing, exhausted x couple days. Pt would like something called in for this. Please advise Dr. Kriste Basque. Thanks  Allergies  Allergen Reactions  . Codeine   . Guaifenesin     REACTION: pt states Nausea \\T \ Vomiting  . Latex     REACTION: SWELLING  . Oxycodone     REACTION: N/V     Carver Fila, CMA

## 2011-04-02 NOTE — Telephone Encounter (Signed)
Per SN---avelox 400mg   #7  1 daily until gone.  Increase mucinex 2 po bid with lots of fluids.  Did she see Dr. Leslie Dales yet?

## 2011-04-06 NOTE — Telephone Encounter (Signed)
[email or web address]  

## 2011-04-06 NOTE — Telephone Encounter (Signed)
Called and spoke with pt and notified of recs per SN. She verbalized understanding. States that she prefers something else be called in besides avelox b/c it causes her stomach to be upset. She states can not tolerate. I added to list of intolerances. Please advise thanks! Allergies  Allergen Reactions  . Codeine   . Guaifenesin     REACTION: pt states Nausea \\T \ Vomiting  . Latex     REACTION: SWELLING  . Oxycodone     REACTION: N/V    Also she states was seen by Dr Abbey Chatters and has her second ov with him on 04/14/11.

## 2011-04-06 NOTE — Telephone Encounter (Signed)
Per SN----ok to give septra ds   #14   1 po  Bid until gone.  thanks

## 2011-04-07 MED ORDER — SULFAMETHOXAZOLE-TRIMETHOPRIM 800-160 MG PO TABS: 1.0000 | ORAL_TABLET | Freq: Two times a day (BID) | ORAL | Status: AC

## 2011-04-07 NOTE — Telephone Encounter (Signed)
Spoke with pt and advised of recs per SN. Rx for septra ds sent to pharm.

## 2011-04-22 LAB — CBC
HCT: 29.8 — ABNORMAL LOW
HCT: 35.2 — ABNORMAL LOW
MCHC: 35.1
MCV: 87.9
MCV: 88.8
Platelets: 220
Platelets: 286
RDW: 12.9
RDW: 13.6

## 2011-04-22 LAB — URINALYSIS, ROUTINE W REFLEX MICROSCOPIC
Glucose, UA: NEGATIVE
Hgb urine dipstick: NEGATIVE
Leukocytes, UA: NEGATIVE
Protein, ur: >300 — AB
Specific Gravity, Urine: 1.024
pH: 5.5

## 2011-04-22 LAB — COMPREHENSIVE METABOLIC PANEL
Albumin: 3.5
BUN: 15
Chloride: 107
Creatinine, Ser: 1.1
Total Bilirubin: 0.6

## 2011-04-22 LAB — BASIC METABOLIC PANEL
BUN: 12
CO2: 25
Chloride: 103
GFR calc non Af Amer: 47 — ABNORMAL LOW
Glucose, Bld: 151 — ABNORMAL HIGH
Potassium: 4.4

## 2011-04-22 LAB — URINE MICROSCOPIC-ADD ON

## 2011-04-22 LAB — PROTIME-INR: INR: 0.9

## 2011-04-22 LAB — APTT: aPTT: 36

## 2011-04-22 LAB — TYPE AND SCREEN

## 2011-04-23 LAB — CROSSMATCH: ABO/RH(D): O POS

## 2011-04-23 LAB — URINE CULTURE
Colony Count: 15000
Special Requests: NEGATIVE

## 2011-04-23 LAB — CBC
HCT: 31.3 — ABNORMAL LOW
Hemoglobin: 10.9 — ABNORMAL LOW
MCHC: 34.6
MCHC: 35.1
MCHC: 35.5
MCV: 86.9
MCV: 87.5
Platelets: 181
RBC: 3.61 — ABNORMAL LOW
RDW: 13.8
RDW: 14.1
RDW: 14.4

## 2011-04-23 LAB — URINALYSIS, ROUTINE W REFLEX MICROSCOPIC
Glucose, UA: NEGATIVE
Hgb urine dipstick: NEGATIVE
Leukocytes, UA: NEGATIVE
Protein, ur: >300 — AB
Specific Gravity, Urine: 1.013
pH: 6

## 2011-04-23 LAB — BASIC METABOLIC PANEL
BUN: 13
CO2: 26
CO2: 27
Calcium: 8.3 — ABNORMAL LOW
Calcium: 8.6
Chloride: 102
Creatinine, Ser: 1.3 — ABNORMAL HIGH
GFR calc Af Amer: 55 — ABNORMAL LOW
Glucose, Bld: 148 — ABNORMAL HIGH
Glucose, Bld: 49 — ABNORMAL LOW
Sodium: 138

## 2011-04-23 LAB — PROTIME-INR
INR: 1.6 — ABNORMAL HIGH
INR: 2 — ABNORMAL HIGH
INR: 3.7 — ABNORMAL HIGH
INR: 3.9 — ABNORMAL HIGH
Prothrombin Time: 19.6 — ABNORMAL HIGH

## 2011-04-23 LAB — URINE MICROSCOPIC-ADD ON

## 2011-05-07 ENCOUNTER — Other Ambulatory Visit: Payer: Self-pay | Admitting: *Deleted

## 2011-05-07 MED ORDER — PREDNISONE 20 MG PO TABS: 20.0000 mg | ORAL_TABLET | Freq: Every day | ORAL | Status: AC

## 2011-05-07 NOTE — Telephone Encounter (Signed)
Called and spoke with pt and per SN---Dr. Altheimer gave the ok for her to start on prednisone.  Pt is aware and this rx has been sent to her pharmacy.  She will take pred 20mg   1 daily and i scheduled pt for appt with SN on oct 25 at 2pm for recheck.  Pt is aware and voiced her understanding of this.  Pt also has appt scheduled with SN on nov 5--we will keep this appt until her ov with SN on 10-25 and will let SN decide at this time if she need to keep the appt in November.

## 2011-05-12 LAB — BASIC METABOLIC PANEL
BUN: 22
CO2: 29
Calcium: 9.5
Creatinine, Ser: 1.42 — ABNORMAL HIGH
GFR calc Af Amer: 46 — ABNORMAL LOW
GFR calc non Af Amer: 38 — ABNORMAL LOW
Glucose, Bld: 338 — ABNORMAL HIGH
Potassium: 4
Sodium: 138

## 2011-05-12 LAB — URINALYSIS, ROUTINE W REFLEX MICROSCOPIC
Bilirubin Urine: NEGATIVE
Ketones, ur: NEGATIVE
Nitrite: NEGATIVE
pH: 5.5

## 2011-05-12 LAB — URINE MICROSCOPIC-ADD ON

## 2011-05-12 LAB — DIFFERENTIAL
Basophils Absolute: 0
Basophils Relative: 0
Lymphocytes Relative: 37
Monocytes Absolute: 0.3
Neutro Abs: 2.6
Neutrophils Relative %: 54

## 2011-05-12 LAB — CBC
MCHC: 34.8
Platelets: 211
RDW: 12.7

## 2011-05-24 ENCOUNTER — Other Ambulatory Visit: Payer: Self-pay | Admitting: Pulmonary Disease

## 2011-05-27 ENCOUNTER — Ambulatory Visit (INDEPENDENT_AMBULATORY_CARE_PROVIDER_SITE_OTHER): Payer: BC Managed Care – PPO | Admitting: Pulmonary Disease

## 2011-05-27 ENCOUNTER — Encounter: Payer: Self-pay | Admitting: Pulmonary Disease

## 2011-05-27 DIAGNOSIS — M1990 Unspecified osteoarthritis, unspecified site: Secondary | ICD-10-CM

## 2011-05-27 DIAGNOSIS — I10 Essential (primary) hypertension: Secondary | ICD-10-CM

## 2011-05-27 DIAGNOSIS — D869 Sarcoidosis, unspecified: Secondary | ICD-10-CM

## 2011-05-27 DIAGNOSIS — E119 Type 2 diabetes mellitus without complications: Secondary | ICD-10-CM

## 2011-05-27 DIAGNOSIS — E669 Obesity, unspecified: Secondary | ICD-10-CM

## 2011-05-27 DIAGNOSIS — D649 Anemia, unspecified: Secondary | ICD-10-CM

## 2011-05-27 DIAGNOSIS — K219 Gastro-esophageal reflux disease without esophagitis: Secondary | ICD-10-CM

## 2011-05-27 DIAGNOSIS — E785 Hyperlipidemia, unspecified: Secondary | ICD-10-CM

## 2011-05-27 DIAGNOSIS — Z23 Encounter for immunization: Secondary | ICD-10-CM

## 2011-05-27 DIAGNOSIS — N259 Disorder resulting from impaired renal tubular function, unspecified: Secondary | ICD-10-CM

## 2011-05-27 DIAGNOSIS — D472 Monoclonal gammopathy: Secondary | ICD-10-CM

## 2011-05-27 DIAGNOSIS — M109 Gout, unspecified: Secondary | ICD-10-CM

## 2011-05-27 MED ORDER — SITAGLIPTIN PHOSPHATE 50 MG PO TABS: 50.0000 mg | ORAL_TABLET | Freq: Every day | ORAL | Status: DC

## 2011-05-27 NOTE — Progress Notes (Signed)
Subjective:    Patient ID: [name], female    DOB: 12-Aug-1945, 65 y.o.   MRN: [id number]  HPI 65 y/o BF here for a follow up visit... he has multiple medical problems as noted below...    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 MRI of the Abd showed splenomegaly & numerous ~1cm lesions throughout ?etiology...  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 she was also seen by DrFox for Nephrology & he concurred w/ the renal insuffic eval but on recheck of her labs- Calcium was 13.7 (?etiology- review of all prev labs showed normal calcium levels in the 9-10 range, last checked 04/28/10= 10.1) & he sent her to [facility] where she was adm by the Hospitalists...  extensive eval in the hosp by TH & DrGranfortuna failed to confirm an etiology for the Hypercalcemia (Ca= 12.8 --> 11.2 at disch after Rx)- the consensus was poss Sarcoid given the splenic lesions & no other explanation (ACE was not checked) & she was started on Pred20mg /d & disch home... DrG did bone marrow & final path was neg- no pathologic diagnosis in bone marrow...  Since disch she is confused by all this & feeling about the same> now w/ predom GI manifestations of abd discomfort, nausea, +reflux, no vomiting, constip, weakness, etc... we discussed incr Nexium to 40mg Bid & ask DrPerry to review her situation & his thoughts re her splenic abn as well... f/u LABS TODAY> BS=368;  BUN=32, Creat=2.2;  Ca=12.6;  Hg=10.5, ACE=63 (8-52)...  ~   November 24, 2010:  [name], no evid malignancy;  Post-op her calcium level returned to normal w/o further intervention;  She had several post-op complications including a subphrenic abscess that required percutaneous drainage, and a left pleural effusion/ basilar atelectasis (s/p thoracentesis & improved)... She has improved slowly at home w/ family help & is planning return to work 11/30/10;  She notes sl cough w/ sm amt thick beige sput, & SOB/ weakness is gradually better;  Denies CP etc, notes insomnia & anxiety...  CXR today w/ improved left base, no clear baseline film;  Labs show Hg=11.6, BS=117, Creat=1.6, FLP looks good, LFTs are OK x sl incr AlkPhos, but note Ca=11.1 (was 8.6 one month ago)...  ~  March 01, 2011:  46mo ROV & she presents w/ recent cough, congestion, green drainage, and tired/ SOB/ etc; she notes incr arthritis pain, aching/ sore and some constipation;  We discussed checking f/u CXR (clear, NAD); and Labs- CBC-ok, BS=167, A1c=8.4, BUN=44, Creat=1.9, Ca=12.0, Phos=4.0, AlkPhos=167, GGT=151, ACE=78;  We discussed Rx w/ Avelox, Mucinex, Fluids; and incr Miralax/ Senakot-S/ MOM for the constipation...    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    Renal Insuffic followed by DrFox for Nephrology> Creat up to 1.9 from 1.6 last, needs incr fluid intake, she knows to avoid nephrotoxic agents/ meds/ etc...    She has known MGUS followed by DrGranfortuna but prev bone marrow was neg etc; due for MGUS labs this fall, current CBC is stable: Hg=12.8, Fe=45...  ~  May 27, 2011:  66mo ROV & she has established w/ DrAltheimer for Endocrine on  Levemir & Apidra, along w/ Glimepiride & Januvia; we called in Prednisone 20mg /d in early Oct to start rx for her high calcium levels & labs from DrA's office reviewed >> Calcium has improved to 10.3 on 3wks of oral Pred...    Sarcoidosis w/ HYPERCALCEMIA> Ca++ was 12.4 -13.4 in Aug & Sept; since starting Pred20mg /d in early Oct her Ca++ has improved to 10.3 now; we decided to slowly taper the Pred in light of her IDDM & improved Ca++ level (try 20mg , alt w/ 10mg  Qod)...    HBP> on Norvasc & Diovan; BP= 132/80 & she denies CP, palpit, dizzy, ch in SOB/ DOE, edema, etc...    CHOL> on Lip20; FLP 4/12 looked good & we reviewed low fat diet restrictions...    DM on Insulin> as above, followed by DrA on Levemir30 & ApidraSS coverage at meals; BS at home varies 150-400+, FBS today=80, latest A1c was 7.6 in Aug...    Obesity> wt= 180# (63"Tall & BMI=32) which is up 8# and we reviewed diet, exercise, & wt reduction strategies...    GI> GERD on NexiumBid; and we reviewed antireflux regimen (elev HOB, npo after dinner, etc)...    Renal Insuffic> Creat had incr to 2.3 when Ca++ rose to 13.4, but has returned to 1.8 now; advised incr free water intake...    Anxiety> on Alprazolam prn; she's been under extra stress w/ loss of job, this illness, family issues...         Problem List:    OBSTRUCTIVE SLEEP APNEA - Sleep Study 11/07 showed RDI=21 w/ desat to 62% during REM.Marland Kitchen. eval by DrSood w/ Rx for CPAP 12... compliance poor- only using it 1-2 times/wk... encouraged to use CPAP more regularly & to f/u w/ DrSood regarding the mask interface problems... ~  8/11:  she reports new mask but still not using CPAP regularly, "I rest fairly well". ~  7/12:  She reports not using her CPAP, and not resting well...  BRONCHITIS, ACUTE - on ADVAIR 250Bid (using Prn only) & PROAIR as needed. 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 ~  CXR 1/09 was pre-op for TKR- sl cardomeg  & ?mild vasc congestion. ~  CXR 2/11 showed norm heart size & vascularity, clear, s/p cholecystectomy, DJD in TSpine. ~  CXR & CT Chest 11/11 showed sl peribronch thickening, no lung lesions, no signif adenopathy. ~  PET scan 1/12 showed hypermetabolic activ in spleen & lymph nodes in the supraclav, hilar, mediastinal, periaotic & iliac region as well. ~  CXRs 3/12 in the perioperative period after splenectomy showed left effusion==> tapped & improved aeration. ~  CXR 4/12 back to baseline w/ clear bases, essent wnl... ~  CXR 7/12 showed norm heart size, clear lungs, no definite adenopathy, NAD... ~  LABS 7/12 w/ ACE=78 (8-52), Ca=12.0, AlkPhos=176 (39-117), GGT=151 (7-51) ~  Labs from DrAltheimer> 8/12: Ca=12.4, Creat=1.8; then 9/12: Ca=13.4, Creat=2.3; PREDNISONE 20mg /d started 10/12 & 3wks later Ca=10.3, Creat=1.8  HYPERTENSION - taking NORVASC  10mg /d,  DIOVAN 320mg /d, & off the prev diuretics due to leg cramps & renal insuffic... ~  4/12:  BP here 132/62 and she denies HA, visual changes, CP, palipit, dizziness, syncope, change in dyspnea, etc... ~  10/12:  BP= 132/80 & she denies CP, palpit, dizzy, ch in SOB/ DOE, edema, etc...  CAD - on ASA 81mg /d...  ~  Hx non-obstructive CAD w/ cath 6/07 by DrStuckey showing luminal irregularities & tiny first marginal branch of the CIRC w/ ostial lesion... good LVF. ~  2DEcho 9/07 showed mildly calcif AoV and normal LVF & wall motion. ~  2DEcho 11/11 showed norm LV wall thickness, norm LVF w/ EF= 60-65%, norm atria, norm valves, trivial peric fluid behind heart.  CEREBROVASCULAR DISEASE - she remains on ASA 81mg /d without TIA's or other neuro manifestations...  ~  MRA Br 9/07 showed mod intracranial atherosclerotic changes...   HYPERLIPIDEMIA - on LIPITOR 20mg /d ~  FLP 7/07 showed TChol 114, TG 85, HDL 28, LDL 69 ~  FLP 4/09 showed TChol 154, TG 192, HDL 34, LDL 82... discussed poss of adding fibrate- hold... ~  FLP 2/10 on Lip20 showed TChol 159,  TG 207, HDL 29, LDL 83... rec> add Fenoglide (she didn't). ~  FLP 2/11 on Lip20 showed TChol 185, TG 242, HDL 43, LDL 99... add FENOFIBRATE 160mg /d... ~  FLP 8/11 on Lip20+Feno160 showed TChol 167, TG 216, HDL 37, LDL 96... she stopped Feno160. ~  FLP 4/12 on Lip20 showed TChol 146, TG 115, HDL 35, LDL 88  DM - on LEVEMIR & APIDRA SS coverage now per DrAltheimer; plus GLIMEPIRIDE 4mg /d and Januvia 50mg /d. ~  labs 4/08 showed BS=138 & HgA1c=7.5.Marland Kitchen. home BS = 100 to >300... misses doses and not on diet or exercising... ~  1/09 became hypoglycemic in hosp on 4 meds post knee surg- meds adjusted... ~  3/09 restarted GLIMEPIRIDE 4mg - 1/2 tab each AM, & continue Lantus & Metformin... ~  labs 4/09 showed BS= 148, HgA1c= 7.3.Marland Kitchen. rec> back on 4 med regimen due to poor control at home. ~  6/09 discussed titrating the Lantus up until FBS 100-120 range... ~  labs 2/10 showed BS= 164, HgA1c= 8.6.Marland KitchenMarland Kitchen very disappointing- needs home BS monitoring, incr Lantus. ~  labs 2/11 showed BS= 298, A1c= 8.4...  discussed change Lantus to LEVEMIR 40 u daily... ~  labs 8/11 showed BS= 202, A1c= 10.9.Marland Kitchen. rec> incr Levemir to 50, consider Humalog. ~  Nov-Dec 2011:  BS up w/ adjust of meds & addition of Pred after hosp 11/11 & hypercalcemia... ~  She states she returned to Levemir 40u daily after the splenectomy hosp due to appetite etc;  4/12 BS=117 ~  Labs 7/12 showed BS= 167, A1c= 8.4> on Levemir 50u/d & Amaryl 4mg  Qam (note creat 1.9). ~  8-10/12: on Levemir30 + ApidraSS per DrA & BS are 150-400+ due to the Pred20; A1c 8/12 was 7.6  OBESITY (ICD-278.00) - weight down to 171 after hosp 11/11- doing better on diet, not yet exercising. ~  weight 220-230# in the early 1990's... ~  weight 210-220# in the early 2000's... ~  weight 2/10 = 192# ~  weight 2/11 = 186# ~  weight 8/11 = 174# ~  weight 12/11 = 171# ~  Weight 4/12 = 168# ~  Weight 7/12 = 171# ~  Weight 10/12 = 180#  HYPERCALCEMIA> this was most likely due to  sarcoidosis w/ extensive splenic involvement;  Calcium was as hight as 13.7 and returned to normal after the  splenectomy. ~  Labs 4/12 showed calcium = 11.1 & this is very disappointing... ~  Labs 7/12 showed Ca= 12.0, Phos= 4.0, ACE= 78 ~  Labs 8-10/12 showed Ca up to 13.4, then down to 10.3 on PRED20mg /d...  GERD - on NEXIUM 40mg  Bid... w/ increased reflux symptoms w/ noct cough etc...  ~  2/10: discussed optimal Rx w/ Nex before dinner, Zantac300 + Reglan10 at bed, elev HOB, etc. ~  2/11: she stopped the Reglan, still using Nexium/ Zantac but w/ persist symptoms>  refer to GI for eval. ~  6/11: GI f/u DrPerry w/ EGD that was normal... continue Rx. ~  12/11:  increased GI symptoms post hosp> she will f/u w/ DrPerry for his input> incr Nexium Bid.  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 ~  6/11:  f/u colonoscopy by DrPerry showed divertics, 4 polyps, AVM.Marland KitchenMarland Kitchen path= tubular adenoma, f/u 9yrs.  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  RENAL INSUFFICIENCY (ICD-588.9) >> Creat ~ 2.0 & eval 10/11 by DrFox- prob due to DM & HBP... ~  Labs 4/12 showed BUN= 27, Creat= 1.6 ~  Labs 7/12 showed BUN= 44, Creat= 1.9 (not on diuretics/ NSAIDs/ etc)... ~  Labs showed Creat up to 2.3 when Ca=13.4; now improved to 1.8 w/ Ca coming down...  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 on LYRICA 100mg  Qhs, off prev Tramadol Rx... ~  2010 eval by DrHiatt @ [facility] on Tramadol 50mg , Lyrica 75mg Bid...  GOUT, UNSPECIFIED (ICD-274.9) - Uric in the 7-8 range...on ALLOPURINOL 300mg /d for prevention...  ANXIETY (ICD-300.00) - she is under mod stress- work, mother died, hospice counselling, etc... ~   8/11:  rec starting ALPRAZOLAM 0.5mg  Tid for palpit, SOB, anxiety...  ANEMIA-NOS & MGUS >> see eval 10-11/11 by DrGranfortuna w/ bone marrow 11/11 in hosp= neg. ~  Labs 4/12 showed Hg= 11.6, MCV= 84 ~  Labs 7/12 showed Hg= 12.8, MCV= 90, Fe= 45 (14%sat)...  PSORIASIS - eval and Rx per DrTafeen prev on HUMIRA injections- stopped 9/11...   Past Surgical History  Procedure Date  . Cholecystectomy   . Vesicovaginal fistula closure w/  total abdominal hysterectomy   . 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 Tonsillectomy   . Appendectomy   . Abdominal hysterectomy   . Breast reduction surgery 1982  . Open splenectomy [id number]    by DrRosenbower    Outpatient Encounter Prescriptions as of 05/27/2011  Medication Sig Dispense Refill  . ADVAIR DISKUS 250-50 MCG/DOSE AEPB inhale 1 dose by mouth twice a day  60 each  5  . albuterol (PROAIR HFA) 108 (90 BASE) MCG/ACT inhaler Inhale 2 puffs into the lungs every 6 (six) hours as needed.        Marland Kitchen allopurinol (ZYLOPRIM) 300 MG tablet take 1 tablet by mouth once daily  30 tablet  PRN  . amLODipine (NORVASC) 10 MG tablet take 1 tablet by mouth once daily  30 tablet  PRN  . aspirin 81 MG tablet Take 81 mg by mouth daily.        Marland Kitchen atorvastatin (LIPITOR) 20 MG tablet take 1 tablet by mouth once daily  30 tablet  PRN  . cloNIDine (CATAPRES) 0.1 MG tablet take  1 tablet by mouth twice a day  60 tablet  5  . DIOVAN 320 MG tablet take 1 tablet by mouth once daily  30 tablet  11  . ferrous sulfate 325 (65 FE) MG tablet take 1 tablet by mouth once daily WITH A MEAL  30 tablet  6  . fluticasone (FLONASE) 50 MCG/ACT nasal spray 2 sprays by Nasal route at bedtime.        Marland Kitchen glimepiride (AMARYL) 4 MG tablet Take 1 tablet (4 mg total) by mouth daily before breakfast.  30 tablet  6  . insulin detemir (LEVEMIR) 100 UNIT/ML injection Inject 30 Units into the skin daily.       Marland Kitchen ketorolac (ACULAR PF) 0.5 % ophthalmic solution Place 1  drop into both eyes 3 (three) times daily.        Marland Kitchen LORazepam (ATIVAN) 1 MG tablet Take 1/2 to 1 tablet by mouth three times daily as needed for nerves  90 tablet  5  . metoprolol tartrate (LOPRESSOR) 25 MG tablet take 1/2 tablet by mouth daily  30 tablet  2  . NEXIUM 40 MG capsule take 1 capsule by mouth twice a day before meals  30 capsule  11  . polyethylene glycol (MIRALAX / GLYCOLAX) packet Take 17 g by mouth daily.        . predniSONE (DELTASONE) 20 MG tablet Alternate dosing as directed(20mg ,10mg ,20mg ,10mg ...)      . pregabalin (LYRICA) 100 MG capsule Take 1 capsule (100 mg total) by mouth at bedtime.  30 capsule  5  . sitaGLIPtin (JANUVIA) 50 MG tablet Take 1 tablet (50 mg total) by mouth daily.  30 tablet  11    Allergies  Allergen Reactions  . Avelox (Moxifloxacin Hcl In Nacl)     GI upset  . Codeine   . Guaifenesin     REACTION: pt states Nausea \\T \ Vomiting  . Latex     REACTION: SWELLING  . Oxycodone     REACTION: N/V    Current Medications, Allergies, Past Medical History, Past Surgical History, Family History, and Social History were reviewed in [employer] record.   Review of Systems         See HPI - all other systems neg except as noted... The patient complains of dyspnea on exertion.  The patient denies anorexia, fever, weight loss, weight gain, vision loss, decreased hearing, hoarseness, chest pain, syncope, peripheral edema, prolonged cough, headaches, hemoptysis, abdominal pain, melena, hematochezia, severe indigestion/heartburn, hematuria, incontinence, muscle weakness, suspicious skin lesions, transient blindness, difficulty walking, depression, unusual weight change, abnormal bleeding, enlarged lymph nodes, and angioedema.     Objective:   Physical Exam      WD, Overweight, 65 y/o BF in NAD...  GENERAL:  Alert & oriented; pleasant & cooperative... HEENT:  [place]/AT, EOM-wnl, PERRLA, EACs-clear, TMs-wnl, NOSE-clear, THROAT- clear &  wnl... NECK:  Supple w/ fair ROM; no JVD; normal carotid impulses w/o bruits; palp thyroid, w/o nodules felt; no lymphadenopathy. CHEST:  Clear to P & A; without wheezes/ rales/ or rhonchi. HEART:  Regular Rhythm; gr 1/6 SEM, without rubs/ or gallops. ABDOMEN:  Scar from splenectomy surg; obese, soft, & nontender w/ panniculus; normal bowel sounds; no organomegaly or masses detected EXT: without deformities, mod arthritic changes, s/p bilat TKRs; no varicose veins/ +venous insuffic/ tr edema. NEURO:  CN's intact; no focal neuro deficits... DERM:  Psoriasis rash per DrTafeen...  RADIOLOGY DATA:  Reviewed in the EPIC EMR & discussed w/ the patient.Marland KitchenMarland Kitchen  LABORATORY DATA:  Reviewed in the EPIC EMR & discussed w/ the patient...    Labs from DrAltheimer yest showed Ca=10.3, BS=163, Creat=1.8.Marland KitchenMarland Kitchen   Assessment & Plan:   SARCOIDOSIS>  With recent elev ACE at 78 and recurrent hypercalcemia up to 13.4> all likely due to sarcoid activity; note prev LNs were pos on the PET in 1/12; however AlkPhos & GGT were elev as well & we re-imagedthe liver==> 2.3cm cyst in right hepatic lobe, no solid lesions seen; We started PRED 20mg /d 10/12 after she established w/ DrAltheimer for DM care, & now Ca is improved to 10.3  Hypercalcemia>  As above- Ca improved to 10.3 after 3wks on Pred20mg /d...  HBP/ CAD>  BP controlled and no angina etc...  DM>  Control has been difficult;  Stressed diet/ exercise, & get wt down; DrA has her on Levemir & ApidraSS coverage...  Renal Insuffic>  Creat was up to 2.3 & now back to 1.8 & rec to increase fluid intake...  Other medical problems as noted.Marland KitchenMarland Kitchen

## 2011-05-27 NOTE — Patient Instructions (Signed)
Today we updated your med list in our EPIC system...    We decided to decrease the Prednisone to 20mg  one day, alternate w/ 10mg  the next day (ie- 20-10-20-05-21-09-etc)  Be sure to drink plenty of water (during the day)...  We gave you the 2012 Flu vaccine today...  Call for any questions...  Let's plan a follow up visit in about 4-6 weeks.Marland KitchenMarland Kitchen

## 2011-06-07 ENCOUNTER — Ambulatory Visit: Payer: BC Managed Care – PPO | Admitting: Pulmonary Disease

## 2011-06-28 ENCOUNTER — Other Ambulatory Visit (INDEPENDENT_AMBULATORY_CARE_PROVIDER_SITE_OTHER): Payer: BC Managed Care – PPO

## 2011-06-28 ENCOUNTER — Ambulatory Visit (INDEPENDENT_AMBULATORY_CARE_PROVIDER_SITE_OTHER): Payer: BC Managed Care – PPO | Admitting: Pulmonary Disease

## 2011-06-28 ENCOUNTER — Encounter: Payer: Self-pay | Admitting: Pulmonary Disease

## 2011-06-28 DIAGNOSIS — E119 Type 2 diabetes mellitus without complications: Secondary | ICD-10-CM

## 2011-06-28 DIAGNOSIS — D869 Sarcoidosis, unspecified: Secondary | ICD-10-CM

## 2011-06-28 DIAGNOSIS — I10 Essential (primary) hypertension: Secondary | ICD-10-CM

## 2011-06-28 LAB — BASIC METABOLIC PANEL
Chloride: 103 mEq/L (ref 96–112)
Creatinine, Ser: 1.7 mg/dL — ABNORMAL HIGH (ref 0.4–1.2)
Potassium: 3.7 mEq/L (ref 3.5–5.1)
Sodium: 140 mEq/L (ref 135–145)

## 2011-06-28 MED ORDER — PREDNISONE 10 MG PO TABS: 10.0000 mg | ORAL_TABLET | Freq: Every day | ORAL | Status: AC

## 2011-06-28 NOTE — Patient Instructions (Signed)
Today we updated your med list in our EPIC system...    We decided to decrease the PREDNISONE to 10mg  daily...  Please go to the lab so we can check your Calcium level & we will call you w/ the results & our plans for further tapering of the Pred...  Call for any questions...  Let's plan a follow up visit in 6-8 weeks time.Marland KitchenMarland Kitchen

## 2011-06-28 NOTE — Progress Notes (Signed)
Subjective:    Patient ID: [name], female    DOB: 05/13/1946, 65 y.o.   MRN: [id number]  HPI 65 y/o BF here for a follow up visit... he has multiple medical problems as noted below...    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 MRI of the Abd showed splenomegaly & numerous ~1cm lesions throughout ?etiology...  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 she was also seen by DrFox for Nephrology & he concurred w/ the renal insuffic eval but on recheck of her labs- Calcium was 13.7 (?etiology- review of all prev labs showed normal calcium levels in the 9-10 range, last checked 04/28/10= 10.1) & he sent her to [facility] where she was adm by the Hospitalists...  extensive eval in the hosp by TH & DrGranfortuna failed to confirm an etiology for the Hypercalcemia (Ca= 12.8 --> 11.2 at disch after Rx)- the consensus was poss Sarcoid given the splenic lesions & no other explanation (ACE was not checked) & she was started on Pred20mg /d & disch home... DrG did bone marrow & final path was neg- no pathologic diagnosis in bone marrow...  Since disch she is confused by all this & feeling about the same> now w/ predom GI manifestations of abd discomfort, nausea, +reflux, no vomiting, constip, weakness, etc... we discussed incr Nexium to 40mg Bid & ask DrPerry to review her situation & his thoughts re her splenic abn as well... f/u LABS TODAY> BS=368;  BUN=32, Creat=2.2;  Ca=12.6;  Hg=10.5, ACE=63 (8-52)...  ~   November 24, 2010:  [name], no evid malignancy;  Post-op her calcium level returned to normal w/o further intervention;  She had several post-op complications including a subphrenic abscess that required percutaneous drainage, and a left pleural effusion/ basilar atelectasis (s/p thoracentesis & improved)... She has improved slowly at home w/ family help & is planning return to work 11/30/10;  She notes sl cough w/ sm amt thick beige sput, & SOB/ weakness is gradually better;  Denies CP etc, notes insomnia & anxiety...  CXR today w/ improved left base, no clear baseline film;  Labs show Hg=11.6, BS=117, Creat=1.6, FLP looks good, LFTs are OK x sl incr AlkPhos, but note Ca=11.1 (was 8.6 one month ago)...  ~  March 01, 2011:  27mo ROV & she presents w/ recent cough, congestion, green drainage, and tired/ SOB/ etc; she notes incr arthritis pain, aching/ sore and some constipation;  We discussed checking f/u CXR (clear, NAD); and Labs- CBC-ok, BS=167, A1c=8.4, BUN=44, Creat=1.9, Ca=12.0, Phos=4.0, AlkPhos=167, GGT=151, ACE=78;  We discussed Rx w/ Avelox, Mucinex, Fluids; and incr Miralax/ Senakot-S/ MOM for the constipation...    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    Renal Insuffic followed by DrFox for Nephrology> Creat up to 1.9 from 1.6 last, needs incr fluid intake, she knows to avoid nephrotoxic agents/ meds/ etc...    She has known MGUS followed by DrGranfortuna but prev bone marrow was neg etc; due for MGUS labs this fall, current CBC is stable: Hg=12.8, Fe=45...  ~  May 27, 2011:  77mo ROV & she has established w/ DrAltheimer for Endocrine on  Levemir & Apidra, along w/ Glimepiride & Januvia; we called in Prednisone 20mg /d in early Oct to start rx for her high calcium levels & labs from DrA's office reviewed >> Calcium has improved to 10.3 on 3wks of oral Pred...    Sarcoidosis w/ HYPERCALCEMIA> Ca++ was 12.4 -13.4 in Aug & Sept; since starting Pred20mg /d in early Oct her Ca++ has improved to 10.3 now; we decided to slowly taper the Pred in light of her IDDM & improved Ca++ level (try 20mg , alt w/ 10mg  Qod)...    HBP> on Norvasc & Diovan; BP= 132/80 & she denies CP, palpit, dizzy, ch in SOB/ DOE, edema, etc...    CHOL> on Lip20; FLP 4/12 looked good & we reviewed low fat diet restrictions...    DM on Insulin> as above, followed by DrA on Levemir30 & ApidraSS coverage at meals; BS at home varies 150-400+, FBS today=80, latest A1c was 7.6 in Aug...    Obesity> wt= 180# (63"Tall & BMI=32) which is up 8# and we reviewed diet, exercise, & wt reduction strategies...    GI> GERD on NexiumBid; and we reviewed antireflux regimen (elev HOB, npo after dinner, etc)...    Renal Insuffic> Creat had incr to 2.3 when Ca++ rose to 13.4, but has returned to 1.8 now; advised incr free water intake...    Anxiety> on Alprazolam prn; she's been under extra stress w/ loss of job, this illness, family issues...  ~  June 28, 2011:  7mo ROV> on Pred 20-10 Qod for the last month and BMet today shows Ca++=9.8;  She is anxious to wean the Pred as quickly as poss due to incr appetite, weight gain, difficulty controlling her DM, etc;  She has been seeing DrAltheimer every 3-4wks w/ adjustments in her insulin> currently on Levemir 46u qd; Apidra 10+SS Tid at meals, Amaryl & Januvia;  BS's vary tremendously & she is struggling w/ diet (weight up 8# in the last month to 188# today);  We decided to cut the Pred to 10mg /d now and then further to 10-5 qod after 3wks w/ ROV in 6 weeks...         Problem List:    OBSTRUCTIVE SLEEP APNEA - Sleep Study 11/07 showed RDI=21 w/  desat to 62% during REM.Marland Kitchen. eval by DrSood w/ Rx for CPAP 12... compliance poor- only using it 1-2 times/wk... encouraged to use CPAP more regularly & to f/u w/ DrSood regarding the mask interface problems... ~  8/11:  she reports new mask but still not using CPAP regularly, "I rest fairly well". ~  7/12:  She reports not using her CPAP, and not resting well...  BRONCHITIS, ACUTE - on ADVAIR 250Bid (using Prn only) & PROAIR as needed. 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 ~  CXR 1/09 was pre-op for TKR- sl cardomeg & ?mild vasc congestion. ~  CXR 2/11 showed norm heart size & vascularity, clear, s/p cholecystectomy, DJD in TSpine. ~  CXR & CT Chest 11/11 showed  sl peribronch thickening, no lung lesions, no signif adenopathy. ~  PET scan 1/12 showed hypermetabolic activ in spleen & lymph nodes in the supraclav, hilar, mediastinal, periaotic & iliac region as well. ~  CXRs 3/12 in the perioperative period after splenectomy showed left effusion==> tapped & improved aeration. ~  CXR 4/12 back to baseline w/ clear bases, essent wnl... ~  CXR 7/12 showed norm heart size, clear lungs, no definite adenopathy, NAD... ~  LABS 7/12 w/ ACE=78 (8-52), Ca=12.0, AlkPhos=176 (39-117), GGT=151 (7-51) ~  Labs from DrAltheimer> 8/12: Ca=12.4, Creat=1.8; then 9/12: Ca=13.4, Creat=2.3;  ~  10/12:  PREDNISONE 20mg /d started 10/12 & 3wks later Ca=10.3, Creat=1.8; therefore weaned to 20-10 Qod. ~  11/12: Labs on Pred 20-10 Qod showed Ca++= 9.8; we will continue to wean Pred.  HYPERTENSION - taking NORVASC 10mg /d,  DIOVAN 320mg /d, & off the prev diuretics due to leg cramps & renal insuffic... ~  4/12:  BP here 132/62 and she denies HA, visual changes, CP, palipit, dizziness, syncope, change in dyspnea, etc... ~  10/12:  BP= 132/80 & she denies CP, palpit, dizzy, ch in SOB/ DOE, edema, etc... ~  11/12:  BP= 132/82 & she is relatively asymptomatic other than DOE from  her weight and some musc cramps...  CAD - on ASA 81mg /d...  ~  Hx non-obstructive CAD w/ cath 6/07 by DrStuckey showing luminal irregularities & tiny first marginal branch of the CIRC w/ ostial lesion... good LVF. ~  2DEcho 9/07 showed mildly calcif AoV and normal LVF & wall motion. ~  2DEcho 11/11 showed norm LV wall thickness, norm LVF w/ EF= 60-65%, norm atria, norm valves, trivial peric fluid behind heart.  CEREBROVASCULAR DISEASE - she remains on ASA 81mg /d without TIA's or other neuro manifestations...  ~  MRA Br 9/07 showed mod intracranial atherosclerotic changes...   HYPERLIPIDEMIA - on LIPITOR 20mg /d ~  FLP 7/07 showed TChol 114, TG 85, HDL 28, LDL 69 ~  FLP 4/09 showed TChol 154, TG 192, HDL 34, LDL 82... discussed poss of adding fibrate- hold... ~  FLP 2/10 on Lip20 showed TChol 159, TG 207, HDL 29, LDL 83... rec> add Fenoglide (she didn't). ~  FLP 2/11 on Lip20 showed TChol 185, TG 242, HDL 43, LDL 99... add FENOFIBRATE 160mg /d... ~  FLP 8/11 on Lip20+Feno160 showed TChol 167, TG 216, HDL 37, LDL 96... she stopped Feno160. ~  FLP 4/12 on Lip20 showed TChol 146, TG 115, HDL 35, LDL 88  DM - on LEVEMIR & APIDRA SS coverage now per DrAltheimer; plus GLIMEPIRIDE 4mg /d and Januvia 50mg /d. ~  labs 4/08 showed BS=138 & HgA1c=7.5.Marland Kitchen. home BS = 100 to >300... misses doses and not on diet or exercising... ~  1/09 became hypoglycemic in hosp on 4 meds post knee surg- meds adjusted... ~  3/09 restarted GLIMEPIRIDE 4mg - 1/2 tab each AM, & continue Lantus & Metformin... ~  labs 4/09 showed BS= 148, HgA1c= 7.3.Marland Kitchen. rec> back on 4 med regimen due to poor control at home. ~  6/09 discussed titrating the Lantus up until FBS 100-120 range... ~  labs 2/10 showed BS= 164, HgA1c= 8.6.Marland KitchenMarland Kitchen very disappointing- needs home BS monitoring, incr Lantus. ~  labs 2/11 showed BS= 298, A1c= 8.4...  discussed change Lantus to LEVEMIR 40 u daily... ~  labs 8/11 showed BS= 202, A1c= 10.9.Marland Kitchen. rec> incr Levemir to 50,  consider Humalog. ~  Nov-Dec 2011:  BS up w/ adjust of meds & addition of Pred after hosp 11/11 & hypercalcemia.Marland KitchenMarland Kitchen ~  She states she returned to Levemir 40u daily after the splenectomy hosp due to appetite etc;  4/12 BS=117 ~  Labs 7/12 showed BS= 167, A1c= 8.4> on Levemir 50u/d & Amaryl 4mg  Qam (note creat 1.9). ~  8-10/12: on Levemir30 + ApidraSS per DrA & BS are 150-400+ due to the Pred20; A1c 8/12 was 7.6 ~  11/12:  DrA has increased her Levemir to 46u/d & the Apidra to 10+SS cover Tid... BS here= 168  OBESITY (ICD-278.00) - weight down to 171 after hosp 11/11- doing better on diet, not yet exercising. ~  weight 220-230# in the early 1990's... ~  weight 210-220# in the early 2000's... ~  weight 2/10 = 192# ~  weight 2/11 = 186# ~  weight 8/11 = 174# ~  weight 12/11 = 171# ~  Weight 4/12 = 168# ~  Weight 7/12 = 171# ~  Weight 10/12 = 180# ~  Weight 11/12 = 188#  HYPERCALCEMIA> this was most likely due to sarcoidosis w/ extensive splenic involvement;  Calcium was as hight as 13.7 and returned to normal after the splenectomy. ~  Labs 4/12 showed calcium = 11.1 & this is very disappointing... ~  Labs 7/12 showed Ca= 12.0, Phos= 4.0, ACE= 78 ~  Labs 8-10/12 showed Ca up to 13.4, then down to 10.3 on PRED20mg /d... ~  Labs 11/12 showed Ca= 9.8 on Pred 20-10 qod; we will continue to wean.  GERD - on NEXIUM 40mg  Bid... w/ increased reflux symptoms w/ noct cough etc...  ~  2/10: discussed optimal Rx w/ Nex before dinner, Zantac300 + Reglan10 at bed, elev HOB, etc. ~  2/11: she stopped the Reglan, still using Nexium/ Zantac but w/ persist symptoms>  refer to GI for eval. ~  6/11: GI f/u DrPerry w/ EGD that was normal... continue Rx. ~  12/11:  increased GI symptoms post hosp> she will f/u w/ DrPerry for his input> incr Nexium Bid.  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 ~  6/11:  f/u colonoscopy by DrPerry showed divertics, 4 polyps, AVM.Marland KitchenMarland Kitchen path= tubular adenoma, f/u 65yrs.  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  RENAL INSUFFICIENCY (ICD-588.9) >> Creat ~ 2.0 & eval 10/11 by DrFox- prob due to DM & HBP... ~  Labs 4/12 showed BUN= 27, Creat= 1.6 ~  Labs 7/12 showed BUN= 44, Creat= 1.9 (not on diuretics/ NSAIDs/ etc)... ~  Labs showed Creat up to 2.3 when Ca=13.4; now improved to 1.8 w/ Ca coming down... ~  Labs 11/12 showed BUN=38, Creat=1.7; rec to maintain hydration...  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 on LYRICA 100mg  Qhs, off prev Tramadol Rx... ~  2010 eval by DrHiatt @ [facility] on Tramadol 50mg , Lyrica 75mg Bid...  GOUT, UNSPECIFIED (ICD-274.9) - Uric in the 7-8 range...on ALLOPURINOL 300mg /d for prevention...  ANXIETY (ICD-300.00) - she is under mod stress- work, mother died, hospice counselling, etc... ~  8/11:  rec starting ALPRAZOLAM 0.5mg  Tid for palpit, SOB, anxiety...  ANEMIA-NOS & MGUS >> see eval 10-11/11 by DrGranfortuna w/ bone marrow 11/11 in hosp= neg. ~  Labs 4/12 showed Hg= 11.6, MCV= 84 ~  Labs 7/12 showed Hg= 12.8, MCV= 90, Fe= 45 (14%sat)...  PSORIASIS - eval and Rx per DrTafeen prev on  HUMIRA injections- stopped 9/11...   Past Surgical History  Procedure Date  . Cholecystectomy   . Vesicovaginal fistula closure w/  total abdominal hysterectomy   . 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 Tonsillectomy   . Appendectomy   . Abdominal hysterectomy   . Breast reduction surgery 1982  . Open splenectomy [id number]    by DrRosenbower    Outpatient Encounter Prescriptions as of 06/28/2011  Medication Sig Dispense Refill  . ADVAIR DISKUS 250-50 MCG/DOSE AEPB inhale 1 dose by mouth twice a day  60  each  5  . albuterol (PROAIR HFA) 108 (90 BASE) MCG/ACT inhaler Inhale 2 puffs into the lungs every 6 (six) hours as needed.        Marland Kitchen allopurinol (ZYLOPRIM) 300 MG tablet take 1 tablet by mouth once daily  30 tablet  PRN  . amLODipine (NORVASC) 10 MG tablet take 1 tablet by mouth once daily  30 tablet  PRN  . aspirin 81 MG tablet Take 81 mg by mouth daily.        Marland Kitchen atorvastatin (LIPITOR) 20 MG tablet take 1 tablet by mouth once daily  30 tablet  PRN  . cholecalciferol (VITAMIN D) 1000 UNITS tablet Take 1,000 Units by mouth daily.        . cloNIDine (CATAPRES) 0.1 MG tablet take 1 tablet by mouth twice a day  60 tablet  5  . DIOVAN 320 MG tablet take 1 tablet by mouth once daily  30 tablet  11  . ferrous sulfate 325 (65 FE) MG tablet take 1 tablet by mouth once daily WITH A MEAL  30 tablet  6  . fluticasone (FLONASE) 50 MCG/ACT nasal spray 2 sprays by Nasal route at bedtime.        Marland Kitchen glimepiride (AMARYL) 4 MG tablet Take 1 tablet (4 mg total) by mouth daily before breakfast.  30 tablet  6  . insulin detemir (LEVEMIR) 100 UNIT/ML injection Inject 46 Units into the skin daily.       . insulin glulisine (APIDRA SOLOSTAR) 100 UNIT/ML injection Use sliding scale as directed       . ketorolac (ACULAR PF) 0.5 % ophthalmic solution Place 1 drop into both eyes 3 (three) times daily.        Marland Kitchen LORazepam (ATIVAN) 1 MG tablet Take 1/2 to 1 tablet by mouth three times daily as needed for nerves  90 tablet  5  . metoprolol tartrate (LOPRESSOR) 25 MG tablet take 1/2 tablet by mouth daily  30 tablet  2  . NEXIUM 40 MG capsule take 1 capsule by mouth twice a day before meals  30 capsule  11  . polyethylene glycol (MIRALAX / GLYCOLAX) packet Take 17 g by mouth daily.        . pregabalin (LYRICA) 100 MG capsule Take 1 capsule (100 mg total) by mouth at bedtime.  30 capsule  5  . sitaGLIPtin (JANUVIA) 50 MG tablet Take 1 tablet (50 mg total) by mouth daily.  30 tablet  11  . predniSONE (DELTASONE) 10 MG tablet Take 1  tablet (10 mg total) by mouth daily.  50 tablet  5    Allergies  Allergen Reactions  . Avelox (Moxifloxacin Hcl In Nacl)     GI upset  . Codeine   . Guaifenesin     REACTION: pt states Nausea \\T \ Vomiting  . Latex     REACTION: SWELLING  . Oxycodone  REACTION: N/V    Current Medications, Allergies, Past Medical History, Past Surgical History, Family History, and Social History were reviewed in [employer] record.   Review of Systems         See HPI - all other systems neg except as noted... The patient complains of dyspnea on exertion.  The patient denies anorexia, fever, weight loss, weight gain, vision loss, decreased hearing, hoarseness, chest pain, syncope, peripheral edema, prolonged cough, headaches, hemoptysis, abdominal pain, melena, hematochezia, severe indigestion/heartburn, hematuria, incontinence, muscle weakness, suspicious skin lesions, transient blindness, difficulty walking, depression, unusual weight change, abnormal bleeding, enlarged lymph nodes, and angioedema.     Objective:   Physical Exam      WD, Overweight, 65 y/o BF in NAD...  GENERAL:  Alert & oriented; pleasant & cooperative... HEENT:  [place]/AT, EOM-wnl, PERRLA, EACs-clear, TMs-wnl, NOSE-clear, THROAT- clear & wnl... NECK:  Supple w/ fair ROM; no JVD; normal carotid impulses w/o bruits; palp thyroid, w/o nodules felt; no lymphadenopathy. CHEST:  Clear to P & A; without wheezes/ rales/ or rhonchi. HEART:  Regular Rhythm; gr 1/6 SEM, without rubs/ or gallops. ABDOMEN:  Scar from splenectomy surg; obese, soft, & nontender w/ panniculus; normal bowel sounds; no organomegaly or masses detected EXT: without deformities, mod arthritic changes, s/p bilat TKRs; no varicose veins/ +venous insuffic/ tr edema. NEURO:  CN's intact; no focal neuro deficits... DERM:  Psoriasis rash per DrTafeen...  RADIOLOGY DATA:  Reviewed in the EPIC EMR & discussed w/ the patient...  LABORATORY DATA:   Reviewed in the EPIC EMR & discussed w/ the patient...    Labs from DrAltheimer 10/12 showed Ca=10.3, BS=163, Creat=1.8.Marland KitchenMarland Kitchen    Labs here 11/12 showed    Assessment & Plan:   SARCOIDOSIS>  With recent elev ACE at 78 and recurrent hypercalcemia up to 13.4> all likely due to sarcoid activity; note prev LNs were pos on the PET in 1/12; however AlkPhos & GGT were elev as well & we re-imaged the liver==> 2.3cm cyst in right hepatic lobe, no solid lesions seen; We started PRED 20mg /d 10/12 after she established w/ DrAltheimer for DM care, & Ca is improved to 10.3; Pred weaned to 20-10 qod & Ca=9.8; we will wean the Pred to 10mg /d then 10-5 qod & recheck in 6 weeks.  Hypercalcemia>  As above- Ca improved to 10.3 after 3wks on Pred20mg /d; now 9.8 on Pred 20-10; continue to wean as above.  HBP/ CAD>  BP controlled and no angina etc...  DM>  Control has been difficult;  Stressed diet/ exercise, & get wt down; DrA has her on Levemir & ApidraSS coverage...  Renal Insuffic>  Creat was up to 2.3 & now back to 1.7 & rec to increase fluid intake...  Other medical problems as noted.Marland KitchenMarland Kitchen

## 2011-06-29 ENCOUNTER — Telehealth: Payer: Self-pay | Admitting: *Deleted

## 2011-06-29 NOTE — Telephone Encounter (Signed)
Called and spoke with pt about her lab results per SN---renal slightly improved/stable creat at 1.7.  Calcium looks great on the pred at 9.8.  So recs are from yesterday---we decreased pred to 10mg  daily and keep it like this x 3 weeks then we will alternate the pred from 10mg /5mg /10mg /5mg   --alternate every other day until her return ov with SN in jan.  Pt voiced her understanding of labs results and how to take the prednisone.  Pt will follow up in jan with SN.

## 2011-07-01 ENCOUNTER — Telehealth: Payer: Self-pay | Admitting: Pulmonary Disease

## 2011-07-01 ENCOUNTER — Other Ambulatory Visit: Payer: Self-pay | Admitting: Pulmonary Disease

## 2011-07-01 MED ORDER — AZITHROMYCIN 250 MG PO TABS: ORAL_TABLET | ORAL | Status: AC

## 2011-07-01 NOTE — Telephone Encounter (Signed)
Called and spoke with pt.  Informed her of TPs recommendations and rx sent to pharmacy.

## 2011-07-01 NOTE — Telephone Encounter (Signed)
Called and spoke with pt.  Pt states she was just seen by SN on Monday 06/28/11.  Pt states she woke up Tuesday "sick as a dog."  C/o sore throat, mostly non productive cough but will cough up small amounts of thick white sputum, stuffy nose, headache, sinus pressure, and chills.  Denies a fever.  Pt also c/o pain up and down her R side of her body.  Pt has applied heat and also has taken asa alternating with tylenol with no relief of symptoms.  Pt wonders if she may have pulled a muscle from coughing or ?!?!?.  Pt is requesting SN's recs.  Please advise.  Thanks.   Allergies  Allergen Reactions  . Avelox (Moxifloxacin Hcl In Nacl)     GI upset  . Codeine   . Guaifenesin     REACTION: pt states Nausea \\T \ Vomiting  . Latex     REACTION: SWELLING  . Oxycodone     REACTION: N/V

## 2011-07-01 NOTE — Telephone Encounter (Signed)
Mucinex DM Twice daily  As needed  Cough/congestion  Fluids and rest  Zpack #1 take as directed.  Tylenol As needed  Fever [name].  Please contact office for sooner follow up if symptoms do not improve or worsen or seek emergency care '

## 2011-07-04 ENCOUNTER — Other Ambulatory Visit: Payer: Self-pay | Admitting: Pulmonary Disease

## 2011-08-10 ENCOUNTER — Other Ambulatory Visit: Payer: Self-pay

## 2011-08-10 ENCOUNTER — Encounter (HOSPITAL_COMMUNITY): Payer: Self-pay | Admitting: *Deleted

## 2011-08-10 ENCOUNTER — Inpatient Hospital Stay (HOSPITAL_COMMUNITY)
Admission: EM | Admit: 2011-08-10 | Discharge: 2011-08-12 | DRG: 544 | Disposition: A | Discharge status: Home / Self Care | Payer: BC Managed Care – PPO | Attending: Internal Medicine | Admitting: Internal Medicine

## 2011-08-10 ENCOUNTER — Emergency Department (HOSPITAL_COMMUNITY): Payer: BC Managed Care – PPO

## 2011-08-10 DIAGNOSIS — J45901 Unspecified asthma with (acute) exacerbation: Secondary | ICD-10-CM | POA: Diagnosis present

## 2011-08-10 DIAGNOSIS — M109 Gout, unspecified: Secondary | ICD-10-CM | POA: Diagnosis present

## 2011-08-10 DIAGNOSIS — K219 Gastro-esophageal reflux disease without esophagitis: Secondary | ICD-10-CM | POA: Diagnosis present

## 2011-08-10 DIAGNOSIS — N179 Acute kidney failure, unspecified: Secondary | ICD-10-CM | POA: Diagnosis present

## 2011-08-10 DIAGNOSIS — E785 Hyperlipidemia, unspecified: Secondary | ICD-10-CM | POA: Diagnosis present

## 2011-08-10 DIAGNOSIS — IMO0002 Reserved for concepts with insufficient information to code with codable children: Secondary | ICD-10-CM

## 2011-08-10 DIAGNOSIS — I509 Heart failure, unspecified: Secondary | ICD-10-CM | POA: Diagnosis present

## 2011-08-10 DIAGNOSIS — I10 Essential (primary) hypertension: Secondary | ICD-10-CM | POA: Insufficient documentation

## 2011-08-10 DIAGNOSIS — F411 Generalized anxiety disorder: Secondary | ICD-10-CM | POA: Insufficient documentation

## 2011-08-10 DIAGNOSIS — I2510 Atherosclerotic heart disease of native coronary artery without angina pectoris: Secondary | ICD-10-CM | POA: Diagnosis present

## 2011-08-10 DIAGNOSIS — D869 Sarcoidosis, unspecified: Secondary | ICD-10-CM | POA: Insufficient documentation

## 2011-08-10 DIAGNOSIS — Z888 Allergy status to other drugs, medicaments and biological substances status: Secondary | ICD-10-CM

## 2011-08-10 DIAGNOSIS — N183 Chronic kidney disease, stage 3 unspecified: Secondary | ICD-10-CM | POA: Diagnosis present

## 2011-08-10 DIAGNOSIS — L408 Other psoriasis: Secondary | ICD-10-CM | POA: Diagnosis present

## 2011-08-10 DIAGNOSIS — Z794 Long term (current) use of insulin: Secondary | ICD-10-CM

## 2011-08-10 DIAGNOSIS — G4733 Obstructive sleep apnea (adult) (pediatric): Secondary | ICD-10-CM | POA: Diagnosis present

## 2011-08-10 DIAGNOSIS — I517 Cardiomegaly: Secondary | ICD-10-CM | POA: Diagnosis present

## 2011-08-10 DIAGNOSIS — I129 Hypertensive chronic kidney disease with stage 1 through stage 4 chronic kidney disease, or unspecified chronic kidney disease: Secondary | ICD-10-CM | POA: Diagnosis present

## 2011-08-10 DIAGNOSIS — I5031 Acute diastolic (congestive) heart failure: Secondary | ICD-10-CM | POA: Diagnosis present

## 2011-08-10 DIAGNOSIS — D649 Anemia, unspecified: Secondary | ICD-10-CM | POA: Diagnosis present

## 2011-08-10 DIAGNOSIS — J45909 Unspecified asthma, uncomplicated: Secondary | ICD-10-CM | POA: Diagnosis present

## 2011-08-10 DIAGNOSIS — E119 Type 2 diabetes mellitus without complications: Secondary | ICD-10-CM | POA: Diagnosis present

## 2011-08-10 HISTORY — DX: Hyperlipidemia, unspecified: E78.5

## 2011-08-10 LAB — CBC
HCT: 33.5 % — ABNORMAL LOW (ref 36.0–46.0)
RBC: 3.71 MIL/uL — ABNORMAL LOW (ref 3.87–5.11)
RDW: 15.8 % — ABNORMAL HIGH (ref 11.5–15.5)
WBC: 14.3 10*3/uL — ABNORMAL HIGH (ref 4.0–10.5)

## 2011-08-10 LAB — COMPREHENSIVE METABOLIC PANEL
AST: 17 U/L (ref 0–37)
Albumin: 3.2 g/dL — ABNORMAL LOW (ref 3.5–5.2)
Alkaline Phosphatase: 117 U/L (ref 39–117)
BUN: 27 mg/dL — ABNORMAL HIGH (ref 6–23)
CO2: 24 mEq/L (ref 19–32)
Chloride: 106 mEq/L (ref 96–112)
Potassium: 4.2 mEq/L (ref 3.5–5.1)
Total Bilirubin: 0.5 mg/dL (ref 0.3–1.2)

## 2011-08-10 LAB — URINALYSIS, ROUTINE W REFLEX MICROSCOPIC
Glucose, UA: NEGATIVE mg/dL
Hgb urine dipstick: NEGATIVE
Ketones, ur: NEGATIVE mg/dL
Protein, ur: 100 mg/dL — AB

## 2011-08-10 LAB — URINE MICROSCOPIC-ADD ON

## 2011-08-10 LAB — POCT I-STAT TROPONIN I: Troponin i, poc: 0.03 ng/mL (ref 0.00–0.08)

## 2011-08-10 LAB — PRO B NATRIURETIC PEPTIDE: Pro B Natriuretic peptide (BNP): 1696 pg/mL — ABNORMAL HIGH (ref 0–125)

## 2011-08-10 LAB — CARDIAC PANEL(CRET KIN+CKTOT+MB+TROPI): Relative Index: INVALID (ref 0.0–2.5)

## 2011-08-10 MED ORDER — ALBUTEROL SULFATE (5 MG/ML) 0.5% IN NEBU
5.0000 mg | INHALATION_SOLUTION | Freq: Once | RESPIRATORY_TRACT | Status: AC
  Administered 2011-08-10: 5 mg via RESPIRATORY_TRACT
  Filled 2011-08-10: qty 1

## 2011-08-10 MED ORDER — METOPROLOL TARTRATE 12.5 MG HALF TABLET
12.5000 mg | ORAL_TABLET | Freq: Every day | ORAL | Status: DC
  Administered 2011-08-11 – 2011-08-12 (×2): 12.5 mg via ORAL
  Filled 2011-08-10 (×2): qty 1

## 2011-08-10 MED ORDER — ASPIRIN 81 MG PO CHEW
81.0000 mg | CHEWABLE_TABLET | Freq: Every day | ORAL | Status: DC
  Administered 2011-08-11 – 2011-08-12 (×2): 81 mg via ORAL
  Filled 2011-08-10 (×2): qty 1

## 2011-08-10 MED ORDER — OLMESARTAN MEDOXOMIL 40 MG PO TABS
40.0000 mg | ORAL_TABLET | Freq: Every day | ORAL | Status: DC
  Administered 2011-08-11 – 2011-08-12 (×2): 40 mg via ORAL
  Filled 2011-08-10 (×2): qty 1

## 2011-08-10 MED ORDER — FLUTICASONE PROPIONATE 50 MCG/ACT NA SUSP
2.0000 | Freq: Every day | NASAL | Status: DC
  Administered 2011-08-11: 2 via NASAL
  Filled 2011-08-10: qty 16

## 2011-08-10 MED ORDER — ASPIRIN 325 MG PO TABS
ORAL_TABLET | ORAL | Status: AC
  Administered 2011-08-10: 325 mg
  Filled 2011-08-10: qty 1

## 2011-08-10 MED ORDER — AMLODIPINE BESYLATE 10 MG PO TABS
10.0000 mg | ORAL_TABLET | Freq: Every day | ORAL | Status: DC
  Administered 2011-08-11 – 2011-08-12 (×2): 10 mg via ORAL
  Filled 2011-08-10 (×2): qty 1

## 2011-08-10 MED ORDER — ALBUTEROL SULFATE (5 MG/ML) 0.5% IN NEBU
2.5000 mg | INHALATION_SOLUTION | Freq: Once | RESPIRATORY_TRACT | Status: AC
  Administered 2011-08-10: 2.5 mg via RESPIRATORY_TRACT
  Filled 2011-08-10: qty 0.5

## 2011-08-10 MED ORDER — LORAZEPAM 0.5 MG PO TABS: 0.5000 mg | ORAL_TABLET | Freq: Four times a day (QID) | ORAL | Status: DC | PRN

## 2011-08-10 MED ORDER — INSULIN ASPART 100 UNIT/ML ~~LOC~~ SOLN
10.0000 [IU] | Freq: Three times a day (TID) | SUBCUTANEOUS | Status: DC
  Administered 2011-08-11: 10 [IU] via SUBCUTANEOUS

## 2011-08-10 MED ORDER — ALBUTEROL SULFATE (5 MG/ML) 0.5% IN NEBU: 2.5000 mg | INHALATION_SOLUTION | Freq: Once | RESPIRATORY_TRACT | Status: DC

## 2011-08-10 MED ORDER — PREGABALIN 100 MG PO CAPS
100.0000 mg | ORAL_CAPSULE | Freq: Every day | ORAL | Status: DC
  Administered 2011-08-11: 100 mg via ORAL
  Filled 2011-08-10: qty 1

## 2011-08-10 MED ORDER — PANTOPRAZOLE SODIUM 40 MG PO TBEC
80.0000 mg | DELAYED_RELEASE_TABLET | Freq: Every day | ORAL | Status: DC
  Administered 2011-08-11 – 2011-08-12 (×3): 80 mg via ORAL
  Filled 2011-08-10 (×2): qty 1
  Filled 2011-08-10 (×2): qty 2

## 2011-08-10 MED ORDER — ENOXAPARIN SODIUM 40 MG/0.4ML ~~LOC~~ SOLN
40.0000 mg | Freq: Every day | SUBCUTANEOUS | Status: DC
  Administered 2011-08-11 (×2): 40 mg via SUBCUTANEOUS
  Filled 2011-08-10 (×4): qty 0.4

## 2011-08-10 MED ORDER — FUROSEMIDE 10 MG/ML IJ SOLN
40.0000 mg | Freq: Once | INTRAMUSCULAR | Status: AC
  Administered 2011-08-10: 40 mg via INTRAVENOUS
  Filled 2011-08-10: qty 4

## 2011-08-10 MED ORDER — NITROGLYCERIN 2 % TD OINT
1.0000 [in_us] | TOPICAL_OINTMENT | Freq: Once | TRANSDERMAL | Status: AC
  Administered 2011-08-10: 1 [in_us] via TOPICAL
  Filled 2011-08-10: qty 30

## 2011-08-10 MED ORDER — IPRATROPIUM BROMIDE 0.02 % IN SOLN
0.5000 mg | Freq: Once | RESPIRATORY_TRACT | Status: AC
  Administered 2011-08-10: 0.5 mg via RESPIRATORY_TRACT
  Filled 2011-08-10: qty 2.5

## 2011-08-10 MED ORDER — FLUTICASONE-SALMETEROL 250-50 MCG/DOSE IN AEPB
1.0000 | INHALATION_SPRAY | Freq: Two times a day (BID) | RESPIRATORY_TRACT | Status: DC
Start: 2011-08-10 — End: 2011-08-12
  Administered 2011-08-11 – 2011-08-12 (×3): 1 via RESPIRATORY_TRACT
  Filled 2011-08-10: qty 14

## 2011-08-10 MED ORDER — SIMVASTATIN 40 MG PO TABS
40.0000 mg | ORAL_TABLET | Freq: Every day | ORAL | Status: DC
  Filled 2011-08-10: qty 1

## 2011-08-10 MED ORDER — INSULIN GLULISINE 100 UNIT/ML ~~LOC~~ SOLN: 10.0000 [IU] | Freq: Three times a day (TID) | SUBCUTANEOUS | Status: DC

## 2011-08-10 MED ORDER — CLONIDINE HCL 0.1 MG PO TABS
0.1000 mg | ORAL_TABLET | Freq: Two times a day (BID) | ORAL | Status: DC
  Administered 2011-08-11 – 2011-08-12 (×4): 0.1 mg via ORAL
  Filled 2011-08-10 (×5): qty 1

## 2011-08-10 MED ORDER — METHYLPREDNISOLONE SODIUM SUCC 125 MG IJ SOLR
125.0000 mg | Freq: Once | INTRAMUSCULAR | Status: AC
  Administered 2011-08-10: 125 mg via INTRAVENOUS
  Filled 2011-08-10: qty 2

## 2011-08-10 MED ORDER — INSULIN DETEMIR 100 UNIT/ML ~~LOC~~ SOLN
46.0000 [IU] | Freq: Every day | SUBCUTANEOUS | Status: DC
Start: 2011-08-11 — End: 2011-08-12
  Administered 2011-08-11: 34 [IU] via SUBCUTANEOUS
  Administered 2011-08-12: 46 [IU] via SUBCUTANEOUS
  Filled 2011-08-10: qty 3

## 2011-08-10 NOTE — ED Notes (Signed)
uP ambulate in the hall whie on pox 92 doe and tacypena

## 2011-08-10 NOTE — ED Notes (Signed)
Report received from Bed 2 nurse.

## 2011-08-10 NOTE — ED Provider Notes (Signed)
History     CSN: [id number]  Arrival date & time 08/10/11  1137   First MD Initiated Contact with Patient 08/10/11 1215      Chief Complaint  Patient presents with  . Shortness of Breath    pt reports SOB since last thursday, states has hx of Sarcoidosis and has been on prednisone, states "I have just blown up and could have some fluid taken off of me."    (Consider location/radiation/quality/duration/timing/severity/associated sxs/prior treatment) HPI The patient presents with shortness of breath for 5 days. Notably the patient has a history of sarcoidosis as well as asthma. She is on chronic prednisone, 5 mg, then 10 mg on alternating days.  The onset of her symptoms was gradual, since onset there has been no clear alleviating factors. The symptoms are worse with exertion. She also notes concurrent chest tightness. No fevers, no chills, mild cough, no nausea, no vomiting, no diarrhea, no change in unfortunate edema, which has been present for some time and is symmetric. Past Medical History  Diagnosis Date  . Unspecified essential hypertension   . CAD (coronary artery disease)   . Gastritis   . Asthma   . OSA (obstructive sleep apnea)   . Esophageal reflux   . Obesity, unspecified   . Degenerative joint disease   . Anxiety state, unspecified   . Hypercalcemia   . Anemia   . Other psoriasis   . Diverticulosis   . Sarcoidosis   . Diabetes mellitus   . Kidney disease   . Dyslipidemia     Past Surgical History  Procedure Date  . Cholecystectomy   . Vesicovaginal fistula closure w/  total abdominal hysterectomy   . 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 Tonsillectomy   . Appendectomy   . Abdominal hysterectomy   . Breast reduction surgery 1982  . 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 Breast cancer    . Diabetes Mother     History  Substance Use Topics  . Smoking status:  Never Smoker   . Smokeless tobacco: Never Used  . Alcohol Use: No    OB History    Grav Para Term Preterm Abortions TAB SAB Ect Mult Living                  Review of Systems  Constitutional:       HPI  HENT:       HPI otherwise negative  Eyes: Negative.   Respiratory:       HPI, otherwise negative  Cardiovascular:       HPI, otherwise nmegative  Gastrointestinal: Negative for vomiting.  Genitourinary:       HPI, otherwise negative  Musculoskeletal:       HPI, otherwise negative  Skin: Negative.   Neurological: Negative for syncope.    Allergies  Avelox; Codeine; Guaifenesin; Latex; and Oxycodone  Home Medications   Current Outpatient Rx  Name Route Sig Dispense Refill  . ADVAIR DISKUS 250-50 MCG/DOSE IN AEPB  inhale 1 dose by mouth twice a day 60 each 5  . ALBUTEROL SULFATE HFA 108 (90 BASE) MCG/ACT IN AERS Inhalation Inhale 2 puffs into the lungs every 6 (six) hours as needed.      . ALLOPURINOL 300 MG PO TABS  take 1 tablet by mouth once daily 30 tablet PRN  . AMLODIPINE BESYLATE 10 MG PO TABS  take 1 tablet by mouth once daily 30 tablet PRN  . ASPIRIN 81 MG PO TABS Oral Take 81 mg by mouth daily.      . ATORVASTATIN CALCIUM 20 MG PO TABS  take 1 tablet by mouth once daily 30 tablet PRN  . VITAMIN D 1000 UNITS PO TABS Oral Take 1,000 Units by mouth daily.      Marland Kitchen CLONIDINE HCL 0.1 MG PO TABS  take 1 tablet by mouth twice a day 60 tablet 5  . DIOVAN 320 MG PO TABS  take 1 tablet by mouth once daily 30 tablet 11  . FERROUS SULFATE 325 (65 FE) MG PO TABS  take 1 tablet by mouth once daily WITH A MEAL 30 tablet 6  . FLUTICASONE PROPIONATE 50 MCG/ACT NA SUSP Nasal 2 sprays by Nasal route at bedtime.      Marland Kitchen GLIMEPIRIDE 4 MG PO TABS Oral Take 1 tablet (4 mg total) by mouth daily before breakfast. 30 tablet 6  . INSULIN DETEMIR 100 UNIT/ML [place] SOLN Subcutaneous Inject 46 Units into the skin daily.     . INSULIN GLULISINE 100 UNIT/ML [place] SOLN Subcutaneous Inject 10-12 Units  into the skin 3 (three) times daily before meals. Use sliding scale as directed    . KETOROLAC TROMETHAMINE 0.5 % OP SOLN Both Eyes Place 1 drop into both eyes 3 (three) times daily.      Marland Kitchen LORAZEPAM 1 MG PO TABS  Take 1/2 to 1 tablet by mouth three times daily as needed for nerves 90 tablet 5  . METOPROLOL TARTRATE 25 MG PO TABS  take 1/2 tablet by mouth once daily 30 tablet 2  . NEXIUM 40 MG PO CPDR  take 1 capsule by mouth twice a day before meals 30 capsule 11  . POLYETHYLENE GLYCOL 3350 PO PACK Oral Take 17 g by mouth daily.      Marland Kitchen PREDNISONE 10 MG PO TABS Oral Take 5-10 mg by mouth daily. Pt takes 5mg  alternating with 10mg  daily     . PREGABALIN 100 MG PO CAPS Oral Take 1 capsule (100 mg total) by mouth at bedtime. 30 capsule 5  . SITAGLIPTIN PHOSPHATE 50 MG PO TABS Oral Take 1 tablet (50 mg total) by mouth daily. 30 tablet 11    BP 184/60  Pulse 83  Temp(Src) 97.9 F (36.6 C) (Oral)  Resp 18  Wt 203 lb (92.08 kg)  SpO2 95%  Physical Exam  Nursing note and vitals reviewed. Constitutional: She is oriented to person, place, and time. She appears well-developed and well-nourished. No distress.  HENT:  Head: Normocephalic and atraumatic.  Eyes: Conjunctivae and EOM are normal.  Cardiovascular: Normal rate and regular rhythm.   Pulmonary/Chest: Effort normal and breath sounds normal. No stridor. No respiratory distress.  Abdominal: She exhibits no distension.  Musculoskeletal: She exhibits edema.  Neurological: She is alert and oriented to person, place, and time. No cranial nerve deficit.  Skin: Skin is warm and dry.  Psychiatric: She has a normal mood and affect.    ED Course  Procedures (including critical care time)   Labs Reviewed  PRO B NATRIURETIC PEPTIDE  CBC  COMPREHENSIVE METABOLIC PANEL  URINALYSIS, ROUTINE W REFLEX MICROSCOPIC  CARDIAC PANEL(CRET KIN+CKTOT+MB+TROPI)   No results found.   No diagnosis found.  Cardiac monitor: 88sr- normal Pulse ox 99% 3l  [place] - abnormal  Cxr: no acute findings   Date: 08/10/2011  Rate: 67  Rhythm: normal sinus rhythm  QRS Axis:  right  Intervals: normal  ST/T Wave abnormalities: nonspecific T wave changes  Conduction Disutrbances:none  Narrative Interpretation:   Old EKG Reviewed: changes noted ABNORMAL ECG  MDM  Differential-year-old female with sarcoidosis, asthma, chronic steroid use now presents with shortness of breath. On exam the patient is in no distress, is mildly tachypneic. The patient's presentation is concerning for asthma exacerbation plus minus sarcoidosis.  The patient's labs are notable for the absence of cardiac ischemia, mild leukocytosis likely reactive and/or secondary to her chronic steroid use, and continued renal dysfunction. The patient does have an elevated BNP, but given her renal dysfunction and her echocardiogram one year ago that did not demonstrate heart failure this is likely a trivial value. All these findings were discussed the patient following multiple treatments. The patient noted that she felt better and wished to go home. Return precautions, and the necessity to continue the working with her primary care physician on her condition were discussed at length. Patient discharged in stable/fair condition       Gerhard Munch, MD 08/10/11 [phone]

## 2011-08-10 NOTE — H&P (Signed)
Primary Care Physician: Alroy Dust MD (Pulmonologist)   Chief Complaint: Progressive shortness of breath  History of Present Illness: Patient is a 66 year old African American woman with multiple medical problems including coronary artery disease, hypertension, obstructive sleep apnea, diabetes, asthma, sarcoidosis currently on low-dose prednisone who presents for evaluation of progressive dyspnea on exertion. Patient reports that she was in her usual state of health until approximately 5 days ago when she started to experience a minimally productive cough. Reports small amounts of white sputum. No recent fevers chills, nausea or vomiting. 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 Denies any tobacco or illicits.  Of note, though, the patient does report worsening orthopnea and perceived bilateral lower extremity edema over the past couple days. Has been taking her medications as directed, trying to wean her prednisone, although she does also report noncompliance with her CPAP. Reports does not feel like a sarcoid or asthma flare. Came in today because dyspnea on exertion continued to get worse.  In the emergency room, temperature 97.9, blood pressure 184/60, heart rate 83, respirations 18, satting 95% on room air. Troponin negative x2. BNP elevated to 1696. Chest radiograph demonstrating cardiomegaly with mild interstitial edema. The patient was given a couple rounds of albuterol as well as Solu-Medrol 125 mg, Lasix 40 mg IV, nitroglycerin patch. She attempted to go home from the emergency room. However, dyspnea did not improve with patient reportedly visibly short of breath with minimal exertion. Patient was admitted for further management.   Past Medical History  Diagnosis Date  . Unspecified essential hypertension   . CAD (coronary artery disease)   . Gastritis   . Asthma   . OSA (obstructive sleep apnea)   . Esophageal reflux   .  Obesity, unspecified   . Degenerative joint disease   . Anxiety state, unspecified   . Hypercalcemia   . Anemia   . Other psoriasis   . Diverticulosis   . Sarcoidosis   . Diabetes mellitus   . Kidney disease   . Dyslipidemia     Past Surgical History  Procedure Date  . Cholecystectomy   . Vesicovaginal fistula closure w/  total abdominal hysterectomy   . 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 Tonsillectomy   . Appendectomy   . Abdominal hysterectomy   . Breast reduction surgery 1982  . Open splenectomy [id number]    by DrRosenbower    Allergies  Allergen Reactions  . Avelox (Moxifloxacin Hcl In Nacl)     GI upset  . Codeine Other (See Comments)    "crazy"   . Guaifenesin     REACTION: pt states Nausea \\T \ Vomiting  . Latex     REACTION: SWELLING  . Oxycodone     REACTION: N/V    No current facility-administered medications on file prior to encounter.   Current Outpatient Prescriptions on File Prior to Encounter  Medication Sig Dispense Refill  . ADVAIR DISKUS 250-50 MCG/DOSE AEPB inhale 1 dose by mouth twice a day  60 each  5  . albuterol (PROAIR HFA) 108 (90 BASE) MCG/ACT inhaler Inhale 2 puffs into the lungs every 6 (six) hours as needed.        Marland Kitchen allopurinol (ZYLOPRIM) 300 MG tablet take 1 tablet by mouth once daily  30 tablet  PRN  . amLODipine (NORVASC) 10 MG tablet take 1 tablet by mouth once daily  30 tablet  PRN  .  aspirin 81 MG tablet Take 81 mg by mouth daily.        Marland Kitchen atorvastatin (LIPITOR) 20 MG tablet take 1 tablet by mouth once daily  30 tablet  PRN  . cholecalciferol (VITAMIN D) 1000 UNITS tablet Take 1,000 Units by mouth daily.        . cloNIDine (CATAPRES) 0.1 MG tablet take 1 tablet by mouth twice a day  60 tablet  5  . DIOVAN 320 MG tablet take 1 tablet by mouth once daily  30 tablet  11  . ferrous sulfate 325 (65 FE) MG tablet take 1 tablet by mouth once daily WITH A MEAL  30 tablet  6  . fluticasone (FLONASE) 50  MCG/ACT nasal spray 2 sprays by Nasal route at bedtime.        Marland Kitchen glimepiride (AMARYL) 4 MG tablet Take 1 tablet (4 mg total) by mouth daily before breakfast.  30 tablet  6  . insulin detemir (LEVEMIR) 100 UNIT/ML injection Inject 46 Units into the skin daily.       . insulin glulisine (APIDRA SOLOSTAR) 100 UNIT/ML injection Inject 10-12 Units into the skin 3 (three) times daily before meals. Use sliding scale as directed      . ketorolac (ACULAR PF) 0.5 % ophthalmic solution Place 1 drop into both eyes 3 (three) times daily.        Marland Kitchen LORazepam (ATIVAN) 1 MG tablet Take 1/2 to 1 tablet by mouth three times daily as needed for nerves  90 tablet  5  . metoprolol tartrate (LOPRESSOR) 25 MG tablet take 1/2 tablet by mouth once daily  30 tablet  2  . NEXIUM 40 MG capsule take 1 capsule by mouth twice a day before meals  30 capsule  11  . polyethylene glycol (MIRALAX / GLYCOLAX) packet Take 17 g by mouth daily.        . pregabalin (LYRICA) 100 MG capsule Take 1 capsule (100 mg total) by mouth at bedtime.  30 capsule  5  . sitaGLIPtin (JANUVIA) 50 MG tablet Take 1 tablet (50 mg total) by mouth daily.  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 No discharge Musculoskeletal: No muscle aches, pain. No arthritis Hematologic: No easy bruising or bleeding Neurologic: No headaches, vision changes, focal neurologic deficits Psychologic: No  suicidial or homicidal ideation. No depression  Filed Vitals:   08/10/11 1426 08/10/11 1621 08/10/11 1956 08/10/11 2047  BP:  144/56 171/74   Pulse:  64 97 70  Temp:  98.9 F (37.2 C)    TempSrc:  Oral Oral   Resp:  16 18 20   Weight:      SpO2: 93% 93% 97% 95%    Physical Exam: General: Alert and oriented x 3, no apparent distress Skin: No rashes, bruises. Stigmata of corticosteroid use with buffalo hump and striations. HEENT: Head atraumatic, sclera anicertic, pupils equal and reactive to light, oropharynx moist with tonsils unremarkable Neck: Soft, no lymphadenopathy, thyromegaly, or bruits Chest: Clear to auscultation bilaterally, no  wheezes, rales, or ronchi Heart: Regular rate and rhythm, normal S1/S2 no rubs, gallops, or murmurs Abdomen: Soft, nontender, obese, + bowel sounds, no masses Extremities: Trace pitting edema bilateral lower extremities. 2+ radial and dorsalis pedis pulses bilaterally Neurologic: Grossly intact   Labs: CBC    Component Value Date/Time   WBC 14.3* 08/10/2011 1405   WBC 4.6 07/17/2010 1551   RBC 3.71* 08/10/2011 1405   RBC 3.71 07/17/2010 1551   HGB 10.8* 08/10/2011 1405   HGB 10.5* 07/17/2010 1551   HCT 33.5* 08/10/2011 1405   HCT 31.5* 07/17/2010 1551   PLT 430* 08/10/2011 1405   PLT 230 07/17/2010 1551   MCV 90.3 08/10/2011 1405   MCV 84.9 07/17/2010 1551   MCH 29.1 08/10/2011 1405   MCH 28.3 07/17/2010 1551   MCHC 32.2 08/10/2011 1405   MCHC 33.3 07/17/2010 1551   RDW 15.8* 08/10/2011 1405   RDW 14.8* 07/17/2010 1551   LYMPHSABS 1.2 03/01/2011 1719   LYMPHSABS 0.8* 07/17/2010 1551   MONOABS 1.1* 03/01/2011 1719   MONOABS 0.5 07/17/2010 1551   EOSABS 0.5 03/01/2011 1719   EOSABS 0.2 07/17/2010 1551   BASOSABS 0.0 03/01/2011 1719   BASOSABS 0.0 07/17/2010 1551    BMET    Component Value Date/Time   NA 139 08/10/2011 1405   K 4.2 08/10/2011 1405   CL 106 08/10/2011 1405   CO2 24 08/10/2011 1405   GLUCOSE 125* 08/10/2011 1405   BUN 27* 08/10/2011 1405    CREATININE 1.62* 08/10/2011 1405   CALCIUM 9.8 08/10/2011 1405   CALCIUM 11.8* 06/21/2010 0402   GFRNONAA 33* 08/10/2011 1405   GFRAA 38* 08/10/2011 1405   Troponin less than 0.30x2 CK-MB 2.8 BNP 1696  Liver function tests: AST 17, ALT 18, alkaline phosphatase 117, total bilirubin 0.5, total protein 7.0, troponin 3.2  Urinalysis: Grossly negative  Chest radiograph: IMPRESSION:  Cardiomegaly with mild interstitial edema.  EKG (my interpretation): Sinus with possible right axis deviation.  No ischemic changes. Grossly unchanged from prior dated 07/2010.  Impression/Plan: 66 year old African American woman with multiple medical problems including coronary artery disease, hypertension, obstructive sleep apnea, diabetes, asthma, sarcoidosis currently on low-dose prednisone who presents for evaluation of progressive dyspnea on exertion. Patient is currently afebrile hemodynamically stable with initial evaluation for ischemic cardiac etiology negative. Differential considerations include asthma/sarcoidosis exacerbation due to recent possible illness. There is also question of heart failure, with possible ischemic or cor pulmonale etiologies given pulmonary diseases, which also may have been exacerbated by the illness.  Dyspnea on exertion: Most likely a combination of heart failure and asthma/sarcoidosis exacerbation due to recent illness. 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: Within normal limits  Hypertension, hyperlipidemia, coronary artery disease, asthma, gout, GERD: Blood pressure elevated, which may be due to dyspnea as well as possible heart failure - Continue home medications - Will monitor blood pressure, expecting to decrease also with diuresis  Diabetes: - Hold home oral anti-hyperglycemics - Continue home Levemir  or equivalent, sliding scale insulin as needed  Sleep apnea: Patient does not know setting of CPAP - Will attempt to restart here  Fluid/electrolytes/nutrition: - Hep-locked IV - Monitor electrolytes daily - Diabetic diet  Prophylaxis: - Lovenox  CODE STATUS: Full code

## 2011-08-10 NOTE — ED Provider Notes (Signed)
Following Dr. Priscille Loveless departure the patient changed her mind regarding admission. Patient ambulated and was very short of breath. She had a repeat troponin which was also negative. Patient did have an elevated BNP. During her discussion she stated that she had no known history of heart failure in his not on any oxygen at home. She has not taken Lasix in the past. Patient was treated with aspirin as well as nitro paste. Showed slight improvement in her symptoms. She continued to feel short of breath with even brief periods of ambulation. Given patient's multiple reasons for shortness of breath admission was felt reasonable. Call was placed to [facility] hospital[facility]. Patient was accepted for admission.  Cyndra Numbers, MD 08/10/11 2207

## 2011-08-10 NOTE — ED Notes (Signed)
Pt. Moved from room 2 to TCU.  Alert and oriented.  C/O coughing occasionally but has improved with breathing treatments according to pt.  Dyspnea with exertion.

## 2011-08-10 NOTE — ED Notes (Signed)
In to discharge patient home--Family member is at bedside

## 2011-08-10 NOTE — ED Notes (Signed)
B/P 174/74  HR 93  Pulse Ox 97%  NTG 1" applied to right shoulder region.

## 2011-08-11 ENCOUNTER — Other Ambulatory Visit: Payer: Self-pay

## 2011-08-11 DIAGNOSIS — I517 Cardiomegaly: Secondary | ICD-10-CM

## 2011-08-11 LAB — HEMOGLOBIN A1C: Hgb A1c MFr Bld: 9.3 % — ABNORMAL HIGH (ref <5.7)

## 2011-08-11 LAB — BASIC METABOLIC PANEL
Chloride: 99 mEq/L (ref 96–112)
GFR calc Af Amer: 37 mL/min — ABNORMAL LOW (ref >90)
Potassium: 4.1 mEq/L (ref 3.5–5.1)
Sodium: 133 mEq/L — ABNORMAL LOW (ref 135–145)

## 2011-08-11 LAB — GLUCOSE, CAPILLARY
Glucose-Capillary: 368 mg/dL — ABNORMAL HIGH (ref 70–99)
Glucose-Capillary: 276 mg/dL — ABNORMAL HIGH (ref 70–99)

## 2011-08-11 LAB — MAGNESIUM: Magnesium: 1.4 mg/dL — ABNORMAL LOW (ref 1.5–2.5)

## 2011-08-11 LAB — CBC
HCT: 31.4 % — ABNORMAL LOW (ref 36.0–46.0)
Hemoglobin: 10.4 g/dL — ABNORMAL LOW (ref 12.0–15.0)
WBC: 12.1 10*3/uL — ABNORMAL HIGH (ref 4.0–10.5)

## 2011-08-11 LAB — CARDIAC PANEL(CRET KIN+CKTOT+MB+TROPI): Troponin I: <0.3 ng/mL (ref <0.30)

## 2011-08-11 MED ORDER — ACETAMINOPHEN 325 MG PO TABS: 650.0000 mg | ORAL_TABLET | Freq: Four times a day (QID) | ORAL | Status: DC | PRN

## 2011-08-11 MED ORDER — INSULIN ASPART 100 UNIT/ML ~~LOC~~ SOLN
0.0000 [IU] | Freq: Three times a day (TID) | SUBCUTANEOUS | Status: DC
  Administered 2011-08-11: 8 [IU] via SUBCUTANEOUS
  Administered 2011-08-11: 5 [IU] via SUBCUTANEOUS
  Administered 2011-08-12: 8 [IU] via SUBCUTANEOUS
  Administered 2011-08-12: 5 [IU] via SUBCUTANEOUS
  Filled 2011-08-11: qty 3

## 2011-08-11 MED ORDER — INSULIN ASPART 100 UNIT/ML ~~LOC~~ SOLN
10.0000 [IU] | Freq: Three times a day (TID) | SUBCUTANEOUS | Status: DC
  Administered 2011-08-11 – 2011-08-12 (×4): 10 [IU] via SUBCUTANEOUS

## 2011-08-11 MED ORDER — INSULIN ASPART 100 UNIT/ML ~~LOC~~ SOLN
10.0000 [IU] | Freq: Once | SUBCUTANEOUS | Status: AC
  Administered 2011-08-11: 10 [IU] via SUBCUTANEOUS
  Filled 2011-08-11: qty 3

## 2011-08-11 MED ORDER — ONDANSETRON HCL 4 MG PO TABS: 4.0000 mg | ORAL_TABLET | Freq: Four times a day (QID) | ORAL | Status: DC | PRN

## 2011-08-11 MED ORDER — SODIUM CHLORIDE 0.9 % IJ SOLN
3.0000 mL | Freq: Two times a day (BID) | INTRAMUSCULAR | Status: DC
  Administered 2011-08-11 (×3): 3 mL via INTRAVENOUS

## 2011-08-11 MED ORDER — SODIUM CHLORIDE 0.9 % IV SOLN: 250.0000 mL | INTRAVENOUS | Status: DC | PRN

## 2011-08-11 MED ORDER — ATORVASTATIN CALCIUM 10 MG PO TABS
20.0000 mg | ORAL_TABLET | Freq: Every day | ORAL | Status: DC
  Administered 2011-08-11: 20 mg via ORAL
  Filled 2011-08-11 (×2): qty 2

## 2011-08-11 MED ORDER — ACETAMINOPHEN 650 MG RE SUPP: 650.0000 mg | Freq: Four times a day (QID) | RECTAL | Status: DC | PRN

## 2011-08-11 MED ORDER — SENNOSIDES-DOCUSATE SODIUM 8.6-50 MG PO TABS
1.0000 | ORAL_TABLET | Freq: Every evening | ORAL | Status: DC | PRN
  Administered 2011-08-12: 1 via ORAL
  Filled 2011-08-11: qty 1

## 2011-08-11 MED ORDER — FUROSEMIDE 10 MG/ML IJ SOLN
40.0000 mg | Freq: Two times a day (BID) | INTRAMUSCULAR | Status: DC
  Administered 2011-08-11 – 2011-08-12 (×3): 40 mg via INTRAVENOUS
  Filled 2011-08-11 (×5): qty 4

## 2011-08-11 MED ORDER — ONDANSETRON HCL 4 MG/2ML IJ SOLN: 4.0000 mg | Freq: Four times a day (QID) | INTRAMUSCULAR | Status: DC | PRN

## 2011-08-11 MED ORDER — INSULIN ASPART 100 UNIT/ML ~~LOC~~ SOLN
0.0000 [IU] | Freq: Every day | SUBCUTANEOUS | Status: DC
  Administered 2011-08-11: 3 [IU] via SUBCUTANEOUS

## 2011-08-11 MED ORDER — PREDNISONE 50 MG PO TABS
50.0000 mg | ORAL_TABLET | Freq: Every day | ORAL | Status: DC
  Administered 2011-08-11 – 2011-08-12 (×2): 50 mg via ORAL
  Filled 2011-08-11 (×2): qty 1

## 2011-08-11 MED ORDER — SODIUM CHLORIDE 0.9 % IJ SOLN: 3.0000 mL | INTRAMUSCULAR | Status: DC | PRN

## 2011-08-11 NOTE — Progress Notes (Addendum)
Subjective: Patient seen and examined ,as per her breathing much better.  Objective: Vital signs in last 24 hours: Temp:  [97.5 F (36.4 C)-98.2 F (36.8 C)] 97.8 F (36.6 C) (01/09 1452) Pulse Rate:  [55-97] 55  (01/09 1452) Resp:  [18-21] 20  (01/09 1452) BP: (149-183)/(70-82) 160/82 mmHg (01/09 1452) SpO2:  [95 %-100 %] 98 % (01/09 1452) Weight:  [90.81 kg (200 lb 3.2 oz)] 90.81 kg (200 lb 3.2 oz) (01/09 0227) Weight change:     Intake/Output from previous day: 01/08 0701 - 01/09 0700 In: 3 [I.V.:3] Out: 350 [Urine:350] Total I/O In: 240 [P.O.:240] Out: -    Physical Exam: General: Alert, awake, oriented x3, in no acute distress. HEENT: No bruits, no goiter. Heart: Regular rate and rhythm, without murmurs, rubs, gallops. Lungs: Clear to auscultation bilaterally. Abdomen: Soft, nontender, nondistended, positive bowel sounds. Extremities: No clubbing cyanosis or edema with positive pedal pulses. Neuro: Grossly intact, nonfocal.    Lab Results: Results for orders placed during the hospital encounter of 08/10/11 (from the past 24 hour(s))  POCT I-STAT TROPONIN I     Status: Normal   Collection Time   08/10/11  8:37 PM      Component Value Range   Troponin i, poc 0.03  0.00 - 0.08 (ng/mL)   Comment 3           GLUCOSE, CAPILLARY     Status: Abnormal   Collection Time   08/11/11  1:58 AM      Component Value Range   Glucose-Capillary 451 (*) 70 - 99 (mg/dL)   Comment 1 Notify RN    MAGNESIUM     Status: Abnormal   Collection Time   08/11/11  5:25 AM      Component Value Range   Magnesium 1.4 (*) 1.5 - 2.5 (mg/dL)  BASIC METABOLIC PANEL     Status: Abnormal   Collection Time   08/11/11  5:25 AM      Component Value Range   Sodium 133 (*) 135 - 145 (mEq/L)   Potassium 4.1  3.5 - 5.1 (mEq/L)   Chloride 99  96 - 112 (mEq/L)   CO2 22  19 - 32 (mEq/L)   Glucose, Bld 408 (*) 70 - 99 (mg/dL)   BUN 35 (*) 6 - 23 (mg/dL)   Creatinine, Ser 4.54 (*) 0.50 - 1.10 (mg/dL)   Calcium 9.6  8.4 - 09.8 (mg/dL)   GFR calc non Af Amer 32 (*) >90 (mL/min)   GFR calc Af Amer 37 (*) >90 (mL/min)  CBC     Status: Abnormal   Collection Time   08/11/11  5:25 AM      Component Value Range   WBC 12.1 (*) 4.0 - 10.5 (K/uL)   RBC 3.56 (*) 3.87 - 5.11 (MIL/uL)   Hemoglobin 10.4 (*) 12.0 - 15.0 (g/dL)   HCT 11.9 (*) 14.7 - 46.0 (%)   MCV 88.2  78.0 - 100.0 (fL)   MCH 29.2  26.0 - 34.0 (pg)   MCHC 33.1  30.0 - 36.0 (g/dL)   RDW 82.9  56.2 - 13.0 (%)   Platelets 434 (*) 150 - 400 (K/uL)  TROPONIN I     Status: Normal   Collection Time   08/11/11  5:25 AM      Component Value Range   Troponin I <0.30  <0.30 (ng/mL)  HEMOGLOBIN A1C     Status: Abnormal   Collection Time   08/11/11  6:00 AM  Component Value Range   Hemoglobin A1C 9.3 (*) <5.7 (%)   Mean Plasma Glucose 220 (*) <117 (mg/dL)  GLUCOSE, CAPILLARY     Status: Abnormal   Collection Time   08/11/11  8:15 AM      Component Value Range   Glucose-Capillary 368 (*) 70 - 99 (mg/dL)  GLUCOSE, CAPILLARY     Status: Abnormal   Collection Time   08/11/11 11:19 AM      Component Value Range   Glucose-Capillary 276 (*) 70 - 99 (mg/dL)    Studies/Results: Dg Chest 2 View  08/10/2011  *RADIOLOGY REPORT*  Clinical Data: Shortness of breath, chest pain  CHEST - 2 VIEW  Comparison: 03/01/2011  Findings: Cardiomegaly with mild interstitial edema. No pleural effusion or pneumothorax.  Cardiomediastinal silhouette is within normal limits.  Degenerative changes of the visualized thoracolumbar spine.  Cholecystectomy clips.  Additional surgical clips in the left upper abdomen.  IMPRESSION: Cardiomegaly with mild interstitial edema.  Original Report Authenticated By: Charline Bills, M.D.    Medications:    . albuterol  5 mg Nebulization Once  . amLODipine  10 mg Oral Daily  . aspirin      . aspirin  81 mg Oral Daily  . atorvastatin  20 mg Oral q1800  . cloNIDine  0.1 mg Oral BID  . enoxaparin  40 mg Subcutaneous QHS  .  fluticasone  2 spray Each Nare QHS  . Fluticasone-Salmeterol  1 puff Inhalation BID  . furosemide  40 mg Intravenous BID  . insulin aspart  0-15 Units Subcutaneous TID WC  . insulin aspart  0-5 Units Subcutaneous QHS  . insulin aspart  10 Units Subcutaneous Once  . insulin aspart  10 Units Subcutaneous TID WC  . insulin detemir  46 Units Subcutaneous Daily  . ipratropium  0.5 mg Nebulization Once  . metoprolol tartrate  12.5 mg Oral Daily  . nitroGLYCERIN  1 inch Topical Once  . olmesartan  40 mg Oral Daily  . pantoprazole  80 mg Oral Q1200  . predniSONE  50 mg Oral Q breakfast  . pregabalin  100 mg Oral QHS  . sodium chloride  3 mL Intravenous Q12H  . DISCONTD: albuterol  2.5 mg Nebulization Once  . DISCONTD: insulin aspart  10-12 Units Subcutaneous TID WC  . DISCONTD: insulin glulisine  10-12 Units Subcutaneous TID AC  . DISCONTD: simvastatin  40 mg Oral QPC supper    sodium chloride, acetaminophen, acetaminophen, LORazepam, ondansetron (ZOFRAN) IV, ondansetron, senna-docusate, sodium chloride     Assessment/Plan:  Principal Problem:  *Dyspnea on exertion Possibly multifactorial (hx of asthma,sarcoidosis ,OSA and CHF) Continue nebs,steroids and lasix  2 D echo showed grade 2 diastolic dysfunction .continue to cycle enzymes to R/O ACS . Consult Dr Kriste Basque  Active Problems: Diastolic Heart failure: cont to diurese,monitor I&O ,weight   OSA: Cont CPAP  Diabetes Mellitus: uncontrolled ,cont levimer,novolog meal coverage and SSI and adjust  Leukocytosis: possibly secondary to steroids  CKD III: renal fx around baseline,monitor while on diuretics CAD; cont meds HTN; uncontrolled ,cont home meds and adjust prn .     LOS: 1 day   Byrd Rushlow 08/11/2011, 6:04 PM

## 2011-08-11 NOTE — Progress Notes (Signed)
*  PRELIMINARY RESULTS* Echocardiogram 2D Echocardiogram has been performed.  Glean Salen [facility] 08/11/2011, 9:56 AM

## 2011-08-11 NOTE — Progress Notes (Signed)
   CARE MANAGEMENT NOTE 08/11/2011  Patient:  [name], MCDEVITT A   Account Number:  [id number]  Date Initiated:  08/11/2011  Documentation initiated by:  [facility]  Subjective/Objective Assessment:   ADMITTED W/SOB.AV:[id number]     Action/Plan:   FROM HOME   Anticipated DC Date:  08/14/2011   Anticipated DC Plan:  HOME/SELF CARE         Choice offered to / List presented to:             Status of service:  In process, will continue to follow Medicare Important Message given?   (If response is "NO", the following Medicare IM given date fields will be blank) Date Medicare IM given:   Date Additional Medicare IM given:    Discharge Disposition:    Per UR Regulation:  Reviewed for med. necessity/level of care/duration of stay  Comments:  08/11/11 [facility] Saxon Crosby RN,BSN [phone]

## 2011-08-12 DIAGNOSIS — J810 Acute pulmonary edema: Secondary | ICD-10-CM

## 2011-08-12 DIAGNOSIS — I5031 Acute diastolic (congestive) heart failure: Secondary | ICD-10-CM

## 2011-08-12 DIAGNOSIS — D869 Sarcoidosis, unspecified: Secondary | ICD-10-CM

## 2011-08-12 DIAGNOSIS — J9600 Acute respiratory failure, unspecified whether with hypoxia or hypercapnia: Secondary | ICD-10-CM

## 2011-08-12 DIAGNOSIS — N179 Acute kidney failure, unspecified: Secondary | ICD-10-CM | POA: Diagnosis present

## 2011-08-12 LAB — CBC
MCHC: 33 g/dL (ref 30.0–36.0)
MCV: 87.5 fL (ref 78.0–100.0)
Platelets: 458 10*3/uL — ABNORMAL HIGH (ref 150–400)
RDW: 15.3 % (ref 11.5–15.5)
WBC: 19.4 10*3/uL — ABNORMAL HIGH (ref 4.0–10.5)

## 2011-08-12 LAB — CARDIAC PANEL(CRET KIN+CKTOT+MB+TROPI)
Relative Index: INVALID (ref 0.0–2.5)
Troponin I: <0.3 ng/mL (ref <0.30)

## 2011-08-12 LAB — GLUCOSE, CAPILLARY: Glucose-Capillary: 213 mg/dL — ABNORMAL HIGH (ref 70–99)

## 2011-08-12 LAB — MAGNESIUM: Magnesium: 1.6 mg/dL (ref 1.5–2.5)

## 2011-08-12 LAB — BASIC METABOLIC PANEL
CO2: 23 mEq/L (ref 19–32)
Calcium: 9.6 mg/dL (ref 8.4–10.5)
Creatinine, Ser: 2.38 mg/dL — ABNORMAL HIGH (ref 0.50–1.10)
GFR calc Af Amer: 24 mL/min — ABNORMAL LOW (ref >90)

## 2011-08-12 LAB — PRO B NATRIURETIC PEPTIDE: Pro B Natriuretic peptide (BNP): 2200 pg/mL — ABNORMAL HIGH (ref 0–125)

## 2011-08-12 MED ORDER — FUROSEMIDE 40 MG PO TABS
40.0000 mg | ORAL_TABLET | Freq: Every day | ORAL | Status: DC
  Filled 2011-08-12: qty 1

## 2011-08-12 MED ORDER — FUROSEMIDE 40 MG PO TABS: 20.0000 mg | ORAL_TABLET | Freq: Every day | ORAL | Status: DC

## 2011-08-12 MED ORDER — ENOXAPARIN SODIUM 30 MG/0.3ML ~~LOC~~ SOLN
30.0000 mg | Freq: Every day | SUBCUTANEOUS | Status: DC
  Filled 2011-08-12: qty 0.3

## 2011-08-12 MED ORDER — PREDNISONE 20 MG PO TABS: 40.0000 mg | ORAL_TABLET | Freq: Every day | ORAL | Status: DC

## 2011-08-12 MED ORDER — HYDRALAZINE HCL 10 MG PO TABS: 10.0000 mg | ORAL_TABLET | Freq: Three times a day (TID) | ORAL | Status: DC

## 2011-08-12 MED ORDER — FUROSEMIDE 40 MG PO TABS: 40.0000 mg | ORAL_TABLET | Freq: Every day | ORAL | Status: DC

## 2011-08-12 MED ORDER — PREDNISONE 10 MG PO TABS: 5.0000 mg | ORAL_TABLET | Freq: Every day | ORAL | Status: DC

## 2011-08-12 NOTE — Progress Notes (Signed)
Inpatient Diabetes Program Recommendations  AACE/ADA: New Consensus Statement on Inpatient Glycemic Control (2009)  Target Ranges:  Prepandial:   less than 140 mg/dL      Peak postprandial:   less than 180 mg/dL (1-2 hours)      Critically ill patients:  140 - 180 mg/dL   Reason for Visit: Elevated A1C of 9.3 % and hyperglycemia on steroid therapy.  Inpatient Diabetes Program Recommendations Insulin - Basal: High A1C at 9.3%.  Increase Levemir to 50 units to start and continue to increase until fasting is less than 150 mg/dL Correction (SSI): Pt may need a correction scale to use in addition to meal coverage tidwc. Insulin - Meal Coverage: Needs increase to 12 - 14 units. Oral Agents: Pt takes Januvia at home.  Please substitute Tradjenta 5 mg/day while here (on forumulary) Diet: Does pt need RD consult while here for nutrtion plan review/carb counting?  Note: Pt appears to need a change in home medication/diet therapy. Please consider above recs as well as potential addition of incretin mimetic, i.e. Byetta bid. Thank you, Lenor Coffin, RN, CNS, Diabetes Coordinator [phone])

## 2011-08-12 NOTE — Discharge Summary (Signed)
Patient ID: [name] MRN: [id number] DOB/AGE: Jul 04, 1946 66 y.o.  Admit date: 08/10/2011 Discharge date: 08/12/2011  Primary Care Physician:  Michele Mcalpine, MD, MD  Discharge Diagnoses:   Principal Problem:  *Dyspnea on exertion  Active Problems:  Diastolic CHF, acute  Acute on chronic renal failure  Asthma exacerbation Anxiety hypertension  Sarcoidosis    Current Discharge Medication List    START taking these medications   Details  furosemide (LASIX) 40 MG tablet Take 0.5 tablets (20 mg total) by mouth daily. Qty: 30 tablet, Refills: 0      CONTINUE these medications which have CHANGED   Details  !! predniSONE (DELTASONE) 10 MG tablet Take 0.5-1 tablets (5-10 mg total) by mouth daily. Pt takes 5mg  alternating with 10mg  daily Qty: 30 tablet, Refills: 0    !! predniSONE (DELTASONE) 20 MG tablet Take 2 tablets (40 mg total) by mouth daily. Qty: 8 tablet, Refills: 0     !! - Potential duplicate medications found. Please discuss with provider.    CONTINUE these medications which have NOT CHANGED   Details  ADVAIR DISKUS 250-50 MCG/DOSE AEPB inhale 1 dose by mouth twice a day Qty: 60 each, Refills: 5    albuterol (PROAIR HFA) 108 (90 BASE) MCG/ACT inhaler Inhale 2 puffs into the lungs every 6 (six) hours as needed.      allopurinol (ZYLOPRIM) 300 MG tablet take 1 tablet by mouth once daily Qty: 30 tablet, Refills: PRN    amLODipine (NORVASC) 10 MG tablet take 1 tablet by mouth once daily Qty: 30 tablet, Refills: PRN    aspirin 81 MG tablet Take 81 mg by mouth daily.      atorvastatin (LIPITOR) 20 MG tablet take 1 tablet by mouth once daily Qty: 30 tablet, Refills: PRN    cholecalciferol (VITAMIN D) 1000 UNITS tablet Take 1,000 Units by mouth daily.      cloNIDine (CATAPRES) 0.1 MG tablet take 1 tablet by mouth twice a day Qty: 60 tablet, Refills: 5    ferrous sulfate 325 (65 FE) MG tablet take 1 tablet by mouth once daily WITH A MEAL Qty: 30  tablet, Refills: 6    fluticasone (FLONASE) 50 MCG/ACT nasal spray 2 sprays by Nasal route at bedtime.      glimepiride (AMARYL) 4 MG tablet Take 1 tablet (4 mg total) by mouth daily before breakfast. Qty: 30 tablet, Refills: 6    insulin detemir (LEVEMIR) 100 UNIT/ML injection Inject 46 Units into the skin daily.     insulin glulisine (APIDRA SOLOSTAR) 100 UNIT/ML injection Inject 10-12 Units into the skin 3 (three) times daily before meals. Use sliding scale as directed    ketorolac (ACULAR PF) 0.5 % ophthalmic solution Place 1 drop into both eyes 3 (three) times daily.      LORazepam (ATIVAN) 1 MG tablet Take 1/2 to 1 tablet by mouth three times daily as needed for nerves Qty: 90 tablet, Refills: 5    metoprolol tartrate (LOPRESSOR) 25 MG tablet take 1/2 tablet by mouth once daily Qty: 30 tablet, Refills: 2    NEXIUM 40 MG capsule take 1 capsule by mouth twice a day before meals Qty: 30 capsule, Refills: 11    polyethylene glycol (MIRALAX / GLYCOLAX) packet Take 17 g by mouth daily.      pregabalin (LYRICA) 100 MG capsule Take 1 capsule (100 mg total) by mouth at bedtime. Qty: 30 capsule, Refills: 5    sitaGLIPtin (JANUVIA) 50 MG tablet Take 1 tablet (50  mg total) by mouth daily. Qty: 30 tablet, Refills: 11      STOP taking these medications     DIOVAN 320 MG tablet         Disposition and Follow-up:  Follow up with Dr Lennon Alstrom in 1 week  you need to check your renal function in 3-4 days  Consults:  none  Significant Diagnostic Studies:  Dg Chest 2 View  08/10/2011  *RADIOLOGY REPORT*  Clinical Data: Shortness of breath, chest pain  CHEST - 2 VIEW  Comparison: 03/01/2011  Findings: Cardiomegaly with mild interstitial edema. No pleural effusion or pneumothorax.  Cardiomediastinal silhouette is within normal limits.  Degenerative changes of the visualized thoracolumbar spine.  Cholecystectomy clips.  Additional surgical clips in the left upper abdomen.  IMPRESSION:  Cardiomegaly with mild interstitial edema.  Original Report Authenticated By: Charline Bills, M.D.    Brief H and P: For complete details please refer to admission H and P, but in brief 66 year old [place] American woman with multiple medical problems including coronary artery disease, hypertension, obstructive sleep apnea, diabetes, asthma, sarcoidosis currently on low-dose prednisone who presents for evaluation of progressive dyspnea on exertion. Patient reports that she was in her usual state of health until approximately 5 days ago when she started to experience a minimally productive cough. Reports small amounts of white sputum. No recent fevers chills, nausea or vomiting. 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 Denies any tobacco or illicits. Of note, though, the patient does report worsening orthopnea and perceived bilateral lower extremity edema over the past couple days. Has been taking her medications as directed, trying to wean her prednisone, although she does also report noncompliance with her CPAP. Reports does not feel like a sarcoid or asthma flare. Came in today because dyspnea on exertion continued to get worse.  In the emergency room, temperature 97.9, blood pressure 184/60, heart rate 83, respirations 18, satting 95% on room air. Troponin negative x2. BNP elevated to 1696. Chest radiograph demonstrating cardiomegaly with mild interstitial edema. The patient was given a couple rounds of albuterol as well as Solu-Medrol 125 mg, Lasix 40 mg IV, nitroglycerin patch. She attempted to go home from the emergency room. However, dyspnea did not improve with patient reportedly visibly short of breath with minimal exertion. Patient was admitted for further management.   Physical Exam on Discharge:  Filed Vitals:   08/11/11 2304 08/12/11 0537 08/12/11 0634 08/12/11 0904  BP:  182/82 178/66   Pulse:  55 60   Temp:  97.7 F (36.5  C)    TempSrc:  Oral    Resp: 66 20    Height:      Weight:  89.994 kg (198 lb 6.4 oz)    SpO2: 100% 97%  96%     Intake/Output Summary (Last 24 hours) at 08/12/11 1253 Last data filed at 08/12/11 0900  Gross per 24 hour  Intake   1303 ml  Output   2150 ml  Net   -847 ml    General: elderly female  in no acute distress. HEENT:no pallor, no icterus , moist oral mucosa Heart: Regular rate and rhythm, without murmurs, rubs, gallops. Lungs: Clear to auscultation bilaterally. Abdomen: Soft, nontender, nondistended, positive bowel sounds. Extremities: No clubbing cyanosis or edema with positive pedal pulses. Neuro: Grossly intact, nonfocal.  CBC:    Component Value Date/Time   WBC 19.4* 08/12/2011 0320   WBC 4.6 07/17/2010 1551  HGB 10.4* 08/12/2011 0320   HGB 10.5* 07/17/2010 1551   HCT 31.5* 08/12/2011 0320   HCT 31.5* 07/17/2010 1551   PLT 458* 08/12/2011 0320   PLT 230 07/17/2010 1551   MCV 87.5 08/12/2011 0320   MCV 84.9 07/17/2010 1551   NEUTROABS 5.5 03/01/2011 1719   NEUTROABS 3.2 07/17/2010 1551   LYMPHSABS 1.2 03/01/2011 1719   LYMPHSABS 0.8* 07/17/2010 1551   MONOABS 1.1* 03/01/2011 1719   MONOABS 0.5 07/17/2010 1551   EOSABS 0.5 03/01/2011 1719   EOSABS 0.2 07/17/2010 1551   BASOSABS 0.0 03/01/2011 1719   BASOSABS 0.0 07/17/2010 1551    Basic Metabolic Panel:    Component Value Date/Time   NA 135 08/12/2011 0320   K 4.0 08/12/2011 0320   CL 99 08/12/2011 0320   CO2 23 08/12/2011 0320   BUN 57* 08/12/2011 0320   CREATININE 2.38* 08/12/2011 0320   GLUCOSE 332* 08/12/2011 0320   CALCIUM 9.6 08/12/2011 0320   CALCIUM 11.8* 06/21/2010 0402    Hospital Course:   *Dyspnea on exertion  Possibly multifactorial (hx of asthma,sarcoidosis ,OSA and CHF)  CXR shows pulm congestion  2d echo showed normal EF with grade 2 diastolic dysfunction Given po prednisone, nebs and IV lasix with improvement  patient stable back to baseline and will be dishcarged home on low dose po  lasix and 40 gm po prednisone for next 4 days. She will resume her home dose of po prednisone after that until she follows with Dr Lennon Alstrom.  2 D echo showed grade 2 diastolic dysfunction .continue to cycle enzymes to R/O ACS .  Discussed with Dr Lennon Alstrom over the phone today and agrees with the plan.   Diastolic Heart failure:  Gr 2 diastolic dysfn on echo  diuresing well on lasix  will reduce dose to 20 mg daily and discharge home on sae dose    AKI on CKD  likely related to diuresis with lasix ( given 40 mg IV bid here) Will hold diovan and discharge on a low dose lasix of 20 mg daily  she needs to check her renal function in next 3-4 days and follow up with Dr Lennon Alstrom in 1 week  OSA: Cont CPAP   Diabetes Mellitus:  uncontrolled ,cont levimer,novolog meal coverage and SSI cont home dose of insulin and oral hypoglycemics  Leukocytosis: possibly secondary to steroids . Monitor as outpatient  HTN  uncontrolled, diovan held for AKI , will discharge on home dose of amlod, BB and added hydralazine 10 mg trid and low dose lasix.    Hx of sarcoidosis with hypercalcemia  follows with Dr Lennon Alstrom  on chronic prednisone  ca level wnl  will cont home dose of po prednisone in next few days after completing short course of 40 mg po prednisone daily.  will follow up with Dr Lennon Alstrom.  Patient clinically stable for discharge home with outpt follow up.  Time spent on Discharge: 45 minutes  Signed: Eddie North 08/12/2011, 12:53 PM

## 2011-08-12 NOTE — Consult Note (Signed)
Name: [name] MRN: [id number] DOB: [birth year]    LOS: 2 Pulmonary MD: Kriste Basque  PCCM CONSULT NOTE  History of Present Illness: 52 yobf with multiple medical problems including coronary artery disease, hypertension, obstructive sleep apnea, diabetes, asthma, sarcoidosis currently on low-dose prednisone for hypercalcemia who presented 1/8  for evaluation of progressive dyspnea on exertion. Patient reports that she was in her usual state of health until approximately 5 days pta when she started to experience a minimally productive cough> mucoid. She notes increased swelling face, abdomen and lower ext. She has OSA but is not compliant with C-pap. She was treated with IV lasix per [facility] and her breathing has improved but she is not at her baseline. PCCM asked to evaluate am 1/10  Last seen at [facility] 07/01/11 and treated with Zpack and mucinex.  Sleeping ok without nocturnal  or early am exacerbation  of respiratory  c/o's or need for noct saba. Also denies any obvious fluctuation of symptoms with weather or environmental changes or other aggravating or alleviating factors except as outlined above   ROS  At present neg for  any significant sore throat, dysphagia, itching, sneezing,  nasal congestion or excess/ purulent secretions,  fever, chills, sweats, unintended wt loss, pleuritic or exertional cp, hempoptysis, orthopnea pnd .Marland Kitchen  Also denies presyncope, palpitations, heartburn, abdominal pain, nausea, vomiting, diarrhea  or change in bowel or urinary habits, dysuria,hematuria,  rash, arthralgias, visual complaints, headache, numbness weakness or ataxia.       Cultures: none  Antibiotics: none    Past Medical History  Diagnosis Date  . Unspecified essential hypertension   . CAD (coronary artery disease)   . Gastritis   . Asthma   . OSA (obstructive sleep apnea)   . Esophageal reflux   . Obesity, unspecified   . Degenerative joint disease   . Anxiety state,  unspecified   . Hypercalcemia   . Anemia   . Other psoriasis   . Diverticulosis   . Sarcoidosis   . Diabetes mellitus   . Kidney disease   . Dyslipidemia    Past Surgical History  Procedure Date  . Cholecystectomy   . Vesicovaginal fistula closure w/  total abdominal hysterectomy   . 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 Tonsillectomy   . Appendectomy   . Abdominal hysterectomy   . Breast reduction surgery 1982  . Open splenectomy [id number]    by DrRosenbower   Prior to Admission medications   Medication Sig Start Date End Date Taking? Authorizing Provider  ADVAIR DISKUS 250-50 MCG/DOSE AEPB inhale 1 dose by mouth twice a day 10/31/10  Yes Michele Mcalpine, MD  albuterol (PROAIR HFA) 108 (90 BASE) MCG/ACT inhaler Inhale 2 puffs into the lungs every 6 (six) hours as needed.     Yes Historical Provider, MD  allopurinol (ZYLOPRIM) 300 MG tablet take 1 tablet by mouth once daily 10/31/10  Yes Michele Mcalpine, MD  amLODipine (NORVASC) 10 MG tablet take 1 tablet by mouth once daily 11/10/10  Yes Michele Mcalpine, MD  aspirin 81 MG tablet Take 81 mg by mouth daily.     Yes Historical Provider, MD  atorvastatin (LIPITOR) 20 MG tablet take 1 tablet by mouth once daily 10/31/10  Yes Michele Mcalpine, MD  cholecalciferol (VITAMIN D) 1000 UNITS tablet Take 1,000 Units by mouth daily.     Yes Historical Provider, MD  cloNIDine (CATAPRES) 0.1 MG tablet take 1  tablet by mouth twice a day 05/24/11 05/24/12 Yes Michele Mcalpine, MD  ferrous sulfate 325 (65 FE) MG tablet take 1 tablet by mouth once daily WITH A MEAL 12/29/10  Yes Michele Mcalpine, MD  fluticasone [facility]) 50 MCG/ACT nasal spray 2 sprays by Nasal route at bedtime.     Yes Historical Provider, MD  glimepiride (AMARYL) 4 MG tablet Take 1 tablet (4 mg total) by mouth daily before breakfast. 11/18/10  Yes Michele Mcalpine, MD  insulin detemir (LEVEMIR) 100 UNIT/ML injection Inject 46 Units into the skin daily.  10/21/10 10/21/11 Yes  Michele Mcalpine, MD  insulin glulisine (APIDRA SOLOSTAR) 100 UNIT/ML injection Inject 10-12 Units into the skin 3 (three) times daily before meals. Use sliding scale as directed   Yes Historical Provider, MD  ketorolac (ACULAR PF) 0.5 % ophthalmic solution Place 1 drop into both eyes 3 (three) times daily.     Yes Historical Provider, MD  LORazepam (ATIVAN) 1 MG tablet Take 1/2 to 1 tablet by mouth three times daily as needed for nerves 03/01/11  Yes Michele Mcalpine, MD  metoprolol tartrate (LOPRESSOR) 25 MG tablet take 1/2 tablet by mouth once daily 07/01/11  Yes Michele Mcalpine, MD  NEXIUM 40 MG capsule take 1 capsule by mouth twice a day before meals 03/16/11  Yes Michele Mcalpine, MD  polyethylene glycol (MIRALAX / GLYCOLAX) packet Take 17 g by mouth daily.     Yes Historical Provider, MD  pregabalin (LYRICA) 100 MG capsule Take 1 capsule (100 mg total) by mouth at bedtime. 02/26/11 02/26/12 Yes Michele Mcalpine, MD  sitaGLIPtin (JANUVIA) 50 MG tablet Take 1 tablet (50 mg total) by mouth daily. 05/27/11  Yes Michele Mcalpine, MD  furosemide (LASIX) 40 MG tablet Take 0.5 tablets (20 mg total) by mouth daily. 08/12/11 08/11/12  Nishant Dhungel, MD  hydrALAZINE (APRESOLINE) 10 MG tablet Take 1 tablet (10 mg total) by mouth 3 (three) times daily. 08/12/11 08/11/12  Nishant Dhungel, MD  predniSONE (DELTASONE) 10 MG tablet Take 0.5-1 tablets (5-10 mg total) by mouth daily. Pt takes 5mg  alternating with 10mg  daily 08/12/11   Nishant Dhungel, MD  predniSONE (DELTASONE) 20 MG tablet Take 2 tablets (40 mg total) by mouth daily. 08/12/11 08/16/11  Nishant Dhungel, MD   Allergies Allergies  Allergen Reactions  . Avelox (Moxifloxacin Hcl In Nacl)     GI upset  . Codeine Other (See Comments)    "crazy"   . Guaifenesin     REACTION: pt states Nausea \\T \ Vomiting  . Latex     REACTION: SWELLING  . Oxycodone     REACTION: N/V    Family History Family History  Problem Relation Age of Onset  . Breast cancer    . Diabetes  Mother     Social History  reports that she has never smoked. She has never used smokeless tobacco. She reports that she does not drink alcohol or use illicit drugs.  Review Of Systems  11 points review of systems is negative with an exception of listed in HPI.  Vital Signs: Temp:  [97.7 F (36.5 C)-97.8 F (36.6 C)] 97.7 F (36.5 C) (01/10 0537) Pulse Rate:  [55-61] 60  (01/10 0634) Resp:  [20-66] 20  (01/10 0537) BP: (160-182)/(66-82) 178/66 mmHg (01/10 0634) SpO2:  [96 %-100 %] 96 % (01/10 0904) Weight:  [198 lb 6.4 oz (89.994 kg)] 198 lb 6.4 oz (89.994 kg) (01/10 0537) I/O last 3 completed shifts: In:  1186 [P.O.:1180; I.V.:6] Out: 2500 [Urine:2500]    Physical Examination: General: obese, cushingnoid Neuro:  intact   HEENT:  Moon pie face Neck:  No jvd   Cardiovascular:  hsr rr Lungs:  cta Abdomen:  Obese +fluid Musculoskeletal:  Intact Skin:  ++edema      Labs and Imaging:   Dg Chest 2 View 08/10/2011  .  IMPRESSION: Cardiomegaly with mild interstitial edema.  Original Report Authenticated By: Charline Bills, M.D.   LABS  Lab 08/12/11 0320 08/11/11 0525 08/10/11 1405  NA 135 133* 139  K 4.0 4.1 4.2  CL 99 99 106  CO2 23 22 24   BUN 57* 35* 27*  CREATININE 2.38* 1.66* 1.62*  GLUCOSE 332* 408* 125*    Lab 08/12/11 0320 08/11/11 0525 08/10/11 1405  HGB 10.4* 10.4* 10.8*  HCT 31.5* 31.4* 33.5*  WBC 19.4* 12.1* 14.3*  PLT 458* 434* 430*     ECHO 08/11/11 - Normal LV size with mild LV hypertrophy. EF 55-60%. Moderate diastolic dysfunction. Normal RV size and systolic function. No significant valvular abnormalities.    Assessment and Plan: Respiratory distress with underlying Sarcoid. Weight gain with steroids. Better with diuresis suggest component of chf and echo 08/11/11 confirms mod diastolic dysfunction -agree with diuresis -FU with Kriste Basque post discharge planned for 08/16/11  The goal with a chronic steroid dependent illness is always arriving at  the lowest effective dose that controls the disease/symptoms and not accepting a set "formula" which is based on statistics or guidelines that don't always take into account patient  variability or the natural hx of the dz in every individual patient, which may well vary over time.  For now therefore I recommend the patient maintain  On 10 mg a/w 5 mg per day until seen    [facility] Minor ACNP Adolph Pollack PCCM Pager [phone] till 3 pm If no answer page [phone] 08/12/2011, 1:10 PM\  Pt independently  seen and examined and available cxr's reviewed and I agree with above findings/ imp/ plan  Sandrea Hughs, MD Pulmonary and Critical Care Medicine [facility] Cell [phone]

## 2011-08-12 NOTE — Progress Notes (Signed)
08/12/11 0635 Pt B/P is 178/66 and HR is 60 manually. Pt asymptomatic upon assessment. Paged on-call MD. No response as of yet. Will continue to monitor pt. Mechele Collin

## 2011-08-13 LAB — GLUCOSE, CAPILLARY
Glucose-Capillary: 210 mg/dL — ABNORMAL HIGH (ref 70–99)
Glucose-Capillary: 300 mg/dL — ABNORMAL HIGH (ref 70–99)

## 2011-08-16 ENCOUNTER — Ambulatory Visit (INDEPENDENT_AMBULATORY_CARE_PROVIDER_SITE_OTHER): Payer: BC Managed Care – PPO | Admitting: Pulmonary Disease

## 2011-08-16 ENCOUNTER — Encounter: Payer: Self-pay | Admitting: Pulmonary Disease

## 2011-08-16 ENCOUNTER — Other Ambulatory Visit (INDEPENDENT_AMBULATORY_CARE_PROVIDER_SITE_OTHER): Payer: BC Managed Care – PPO

## 2011-08-16 DIAGNOSIS — I5030 Unspecified diastolic (congestive) heart failure: Secondary | ICD-10-CM

## 2011-08-16 DIAGNOSIS — I10 Essential (primary) hypertension: Secondary | ICD-10-CM

## 2011-08-16 DIAGNOSIS — E119 Type 2 diabetes mellitus without complications: Secondary | ICD-10-CM

## 2011-08-16 DIAGNOSIS — D649 Anemia, unspecified: Secondary | ICD-10-CM

## 2011-08-16 DIAGNOSIS — I2510 Atherosclerotic heart disease of native coronary artery without angina pectoris: Secondary | ICD-10-CM

## 2011-08-16 DIAGNOSIS — E785 Hyperlipidemia, unspecified: Secondary | ICD-10-CM

## 2011-08-16 DIAGNOSIS — D869 Sarcoidosis, unspecified: Secondary | ICD-10-CM

## 2011-08-16 DIAGNOSIS — M1990 Unspecified osteoarthritis, unspecified site: Secondary | ICD-10-CM

## 2011-08-16 DIAGNOSIS — G4733 Obstructive sleep apnea (adult) (pediatric): Secondary | ICD-10-CM

## 2011-08-16 DIAGNOSIS — N259 Disorder resulting from impaired renal tubular function, unspecified: Secondary | ICD-10-CM

## 2011-08-16 DIAGNOSIS — F411 Generalized anxiety disorder: Secondary | ICD-10-CM

## 2011-08-16 DIAGNOSIS — E669 Obesity, unspecified: Secondary | ICD-10-CM

## 2011-08-16 DIAGNOSIS — I5032 Chronic diastolic (congestive) heart failure: Secondary | ICD-10-CM | POA: Insufficient documentation

## 2011-08-16 LAB — CBC WITH DIFFERENTIAL/PLATELET
Basophils Relative: 0.4 % (ref 0.0–3.0)
Eosinophils Absolute: 0.4 10*3/uL (ref 0.0–0.7)
Lymphocytes Relative: 20 % (ref 12.0–46.0)
MCHC: 33.2 g/dL (ref 30.0–36.0)
Monocytes Relative: 8.5 % (ref 3.0–12.0)
Neutrophils Relative %: 68.4 % (ref 43.0–77.0)
RBC: 4.19 Mil/uL (ref 3.87–5.11)
WBC: 13.9 10*3/uL — ABNORMAL HIGH (ref 4.5–10.5)

## 2011-08-16 LAB — BASIC METABOLIC PANEL
CO2: 27 mEq/L (ref 19–32)
Calcium: 9.7 mg/dL (ref 8.4–10.5)
Creatinine, Ser: 1.7 mg/dL — ABNORMAL HIGH (ref 0.4–1.2)
GFR: 37.95 mL/min — ABNORMAL LOW (ref >60.00)
Sodium: 140 mEq/L (ref 135–145)

## 2011-08-16 MED ORDER — FIRST-DUKES MOUTHWASH MT SUSP: OROMUCOSAL | Status: DC

## 2011-08-16 NOTE — Patient Instructions (Signed)
Today we updated your med list in our EPIC system...    We adjusted your meds to take into account the recent post hospital changes...    We decided to decrease the Prednisone to 5mg  daily (1/2 of the 10mg  tab), and continue the Lasix at 20mg /d (1/2 of the 40mg  tab).  Today we did your follow up fasting blood work...    Please call the [facility]REE in a few days for your results...    Dial [id number] & when prompted enter your patient number followed by the # symbol...    Your patient number is:  [id number]#  Work on your diet, exercise program, and weight reduction...  Call for any questions...  Let's plan a recheck in about 6 weeks' time.Marland KitchenMarland Kitchen

## 2011-08-16 NOTE — Progress Notes (Signed)
Subjective:    Patient ID: [name], female    DOB: 07-22-1946, 66 y.o.   MRN: [id number]  HPI 66 y/o BF here for a follow up visit... he has multiple medical problems as noted below...    ~  November 24, 2010:  [name], no evid malignancy;  Post-op her calcium level returned to normal w/o further intervention;  She had several post-op complications including a subphrenic abscess that required percutaneous drainage, and a left pleural effusion/ basilar atelectasis (s/p thoracentesis & improved)... She has improved slowly at home w/ family help & is planning return to work 11/30/10;  She notes sl cough w/ sm amt thick beige sput, & SOB/ weakness is gradually better;  Denies CP etc, notes insomnia & anxiety...  CXR today w/ improved left base, no clear baseline film;  Labs show Hg=11.6, BS=117, Creat=1.6, FLP looks good, LFTs are OK x sl incr AlkPhos, but note Ca=11.1 (was 8.6 one month ago)...  ~  March 01, 2011:  74mo ROV & she presents w/ recent cough, congestion, green drainage, and tired/ SOB/ etc; she notes incr arthritis pain, aching/ sore and some constipation;  We discussed checking f/u CXR (clear, NAD); and Labs- CBC-ok, BS=167, A1c=8.4, BUN=44, Creat=1.9, Ca=12.0, Phos=4.0, AlkPhos=167, GGT=151, ACE=78;  We discussed Rx w/ Avelox, Mucinex, Fluids; and incr Miralax/ Senakot-S/ MOM for the constipation...    Recurrent hypercalcemia most likely from active Sarcoidosis> labs w/ incr AlkPhos & GGT> we will image the liver at this time to compare to prev; and consult w/ Endocrinology for their opinion regarding the hypercalcemia AND her IDDM (esp in view of poss Prednisone rx for the calcium & sarcoid)...    Renal Insuffic followed by DrFox for Nephrology> Creat up to 1.9 from 1.6 last, needs incr fluid intake, she knows to avoid nephrotoxic agents/ meds/ etc...    She has known MGUS followed by  DrGranfortuna but prev bone marrow was neg etc; due for MGUS labs this fall, current CBC is stable: Hg=12.8, Fe=45...  ~  May 27, 2011:  74mo ROV & she has established w/ DrAltheimer for Endocrine on Levemir & Apidra, along w/ Glimepiride & Januvia; we called in Prednisone 20mg /d in early Oct to start rx for her high calcium levels & labs from DrA's office reviewed >> Calcium has improved to 10.3 on 3wks of oral Pred...    Sarcoidosis w/ HYPERCALCEMIA> Ca++ was 12.4 -13.4 in Aug & Sept; since starting Pred20mg /d in early Oct her Ca++ has improved to 10.3 now; we decided to slowly taper the Pred in light of her IDDM & improved Ca++ level (try 20mg , alt w/ 10mg  Qod)...    HBP> on Norvasc & Diovan; BP= 132/80 & she denies CP, palpit, dizzy, ch in SOB/ DOE, edema, etc...    CHOL> on Lip20; FLP 4/12 looked good & we reviewed low fat diet restrictions...    DM on Insulin> as above, followed by DrA on Levemir30 & ApidraSS coverage at meals; BS at home varies 150-400+, FBS today=80, latest A1c was 7.6 in Aug...    Obesity> wt= 180# (63"Tall & BMI=32) which is up 8# and we reviewed diet, exercise, & wt reduction strategies...    GI> GERD on NexiumBid; and we reviewed antireflux regimen (elev HOB, npo after dinner, etc)...    Renal Insuffic> Creat had incr to 2.3 when Ca++ rose to 13.4, but has returned to 1.8 now; advised incr  free water intake...    Anxiety> on Alprazolam prn; she's been under extra stress w/ loss of job, this illness, family issues...  ~  June 28, 2011:  36mo ROV> on Pred 20-10 Qod for the last month and BMet today shows Ca++=9.8;  She is anxious to wean the Pred as quickly as poss due to incr appetite, weight gain, difficulty controlling her DM, etc;  She has been seeing DrAltheimer every 3-4wks w/ adjustments in her insulin> currently on Levemir 46u qd; Apidra 10+SS Tid at meals, Amaryl & Januvia;  BS's vary tremendously & she is struggling w/ diet (weight up 8# in the last month to  188# today);  We decided to cut the Pred to 10mg /d now and then further to 10-5 qod after 3wks w/ ROV in 6 weeks...  ~  August 16, 2011:  6wk ROV & post hosp check> she was Adm by [facility] 1/8 - 08/12/11 w/ dyspnea (DOE, orthopnea, edema) thought to be due to diastolic heart failure (2DEcho w/ Gr2DD, BNP=2200), she was diuresed & improved, disch on LASIX40mg - 1/2 tab each am & her prev Diovan was stopped (due to renal insuffic w/ Creat=2.4)...  Her serum Calcium levels were all norm 9.6-9.8;  A1c=9.3;  Hg=10.4>> See below...    Feeling better since disch;  Weight 196# is still 8# heavier than her last visit w/ me 11/12;  We decided to decr her Pred fro 10-5 Qod to 5mg /d & keep Lasix at 20mg /d...    Follow up Labs today showed:  BUN=33, Creat=1.7, BNP=100;  Ca=9.7;  Hg=12.9;  BS=69 & she will f/u w/ drAltheimer...         Problem List:    OBSTRUCTIVE SLEEP APNEA - Sleep Study 11/07 showed RDI=21 w/ desat to 62% during REM.Marland Kitchen. eval by DrSood w/ Rx for CPAP 12... compliance poor- only using it 1-2 times/wk... encouraged to use CPAP more regularly & to f/u w/ DrSood regarding the mask interface problems... ~  8/11:  she reports new mask but still not using CPAP regularly, "I rest fairly well". ~  7/12:  She reports not using her CPAP, and not resting well... ~  1/13:  CPAP used briefly during [facility] for diastolic CHF, but still not compliant w/ home use...  BRONCHITIS, ACUTE - on ADVAIR 250Bid (using Prn only) & PROAIR as needed. 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 ~  CXR 1/09 was pre-op for TKR- sl cardomeg & ?mild vasc congestion. ~  CXR 2/11 showed norm heart size & vascularity, clear, s/p cholecystectomy, DJD in TSpine. ~  CXR & CT Chest 11/11 showed sl peribronch thickening, no lung lesions, no signif adenopathy. ~  PET scan 1/12 showed hypermetabolic activ in spleen & lymph nodes in the supraclav, hilar, mediastinal, periaotic & iliac region as  well. ~  CXRs 3/12 in the perioperative period after splenectomy showed left effusion==> tapped & improved aeration. ~  CXR 4/12 back to baseline w/ clear bases, essent wnl... ~  CXR 7/12 showed norm heart size, clear lungs, no definite adenopathy, NAD... ~  LABS 7/12 w/ ACE=78 (8-52), Ca=12.0, AlkPhos=176 (39-117), GGT=151 (7-51) ~  Labs from DrAltheimer> 8/12: Ca=12.4, Creat=1.8; then 9/12: Ca=13.4, Creat=2.3;  ~  10/12:  PREDNISONE 20mg /d started 10/12 & 3wks later Ca=10.3, Creat=1.8; therefore weaned to 20-10 Qod. ~  11/12: Labs on Pred 20-10 Qod showed Ca++= 9.8; we will continue to wean Pred. ~  1/13:  Labs during [place] showed Ca=9.7 & we decided  to wean Pred to 5mg /d...  HYPERTENSION - taking NORVASC 10mg /d, back on diuretic 1/13 due to DiastolicCHF w/ LASIX40- 1/2 tab Qam; off prev [id number] due to azotemia w/ diuresis. ~  4/12:  BP here 132/62 and she denies HA, visual changes, CP, palipit, dizziness, syncope, change in dyspnea, etc... ~  10/12:  BP= 132/80 & she denies CP, palpit, dizzy, ch in SOB/ DOE, edema, etc... ~  11/12:  BP= 132/82 & she is relatively asymptomatic other than DOE from her weight and some musc cramps... ~  1/13: Post Hosp BP= 146/70 on Amlod10, Lasix20; she denies CP, palpit, SOB, edema> improved from recent hosp...  CAD - on ASA 81mg /d...  ~  Hx non-obstructive CAD w/ cath 6/07 by DrStuckey showing luminal irregularities & tiny first marginal branch of the CIRC w/ ostial lesion... good LVF. ~  2DEcho 9/07 showed mildly calcif AoV and normal LVF & wall motion. ~  2DEcho 11/11 showed norm LV wall thickness, norm LVF w/ EF= 60-65%, norm atria, norm valves, trivial peric fluid behind heart. ~  2DEcho 1/13 showed mild LVH, norm LVF w/ EF=55-60% & no regional wall motion abn, Gr2DD, norm RV...  CEREBROVASCULAR DISEASE - she remains on ASA 81mg /d without TIA's or other neuro manifestations...  ~  MRA Br 9/07 showed mod intracranial atherosclerotic changes...    HYPERLIPIDEMIA - on LIPITOR 20mg /d ~  FLP 7/07 showed TChol 114, TG 85, HDL 28, LDL 69 ~  FLP 4/09 showed TChol 154, TG 192, HDL 34, LDL 82... discussed poss of adding fibrate- hold... ~  FLP 2/10 on Lip20 showed TChol 159, TG 207, HDL 29, LDL 83... rec> add Fenoglide (she didn't). ~  FLP 2/11 on Lip20 showed TChol 185, TG 242, HDL 43, LDL 99... add FENOFIBRATE 160mg /d... ~  FLP 8/11 on Lip20+Feno160 showed TChol 167, TG 216, HDL 37, LDL 96... she stopped Feno160. ~  FLP 4/12 on Lip20 showed TChol 146, TG 115, HDL 35, LDL 88  DM - on LEVEMIR & APIDRA SS coverage now per DrAltheimer; plus GLIMEPIRIDE 4mg /d and Januvia 50mg /d. ~  labs 4/08 showed BS=138 & HgA1c=7.5.Marland Kitchen. home BS = 100 to >300... misses doses and not on diet or exercising... ~  1/09 became hypoglycemic in hosp on 4 meds post knee surg- meds adjusted... ~  3/09 restarted GLIMEPIRIDE 4mg - 1/2 tab each AM, & continue Lantus & Metformin... ~  labs 4/09 showed BS= 148, HgA1c= 7.3.Marland Kitchen. rec> back on 4 med regimen due to poor control at home. ~  6/09 discussed titrating the Lantus up until FBS 100-120 range... ~  labs 2/10 showed BS= 164, HgA1c= 8.6.Marland KitchenMarland Kitchen very disappointing- needs home BS monitoring, incr Lantus. ~  labs 2/11 showed BS= 298, A1c= 8.4...  discussed change Lantus to LEVEMIR 40 u daily... ~  labs 8/11 showed BS= 202, A1c= 10.9.Marland Kitchen. rec> incr Levemir to 50, consider Humalog. ~  Nov-Dec 2011:  BS up w/ adjust of meds & addition of Pred after hosp 11/11 & hypercalcemia... ~  She states she returned to Levemir 40u daily after the splenectomy hosp due to appetite etc;  4/12 BS=117 ~  Labs 7/12 showed BS= 167, A1c= 8.4> on Levemir 50u/d & Amaryl 4mg  Qam (note creat 1.9). ~  8-10/12: on Levemir30 + ApidraSS per DrA & BS are 150-400+ due to the Pred20; A1c 8/12 was 7.6 ~  11/12:  DrA has increased her Levemir to 46u/d & the Apidra to 10+SS cover Tid... BS here= 168 ~  1/13: Covered w/ SSI  in [place] but A1c=9.3 (Creat up to 2.4 but improved  to 1.7 w/ adjust meds)...  OBESITY (ICD-278.00) - weight down to 171 after hosp 11/11- doing better on diet, not yet exercising. ~  weight 220-230# in the early 1990's... ~  weight 210-220# in the early 2000's... ~  weight 2/10 = 192# ~  weight 2/11 = 186# ~  weight 8/11 = 174# ~  weight 12/11 = 171# ~  Weight 4/12 = 168# ~  Weight 7/12 = 171# ~  Weight 10/12 = 180# ~  Weight 11/12 = 188# ~  Weight 1/13 = 196#  HYPERCALCEMIA> this was most likely due to sarcoidosis w/ extensive splenic involvement;  Calcium was as hight as 13.7 and returned to normal after the splenectomy. ~  Labs 4/12 showed calcium = 11.1 & this is very disappointing... ~  Labs 7/12 showed Ca= 12.0, Phos= 4.0, ACE= 78 ~  Labs 8-10/12 showed Ca up to 13.4, then down to 10.3 on PRED20mg /d... ~  Labs 11/12 showed Ca= 9.8 on Pred 20-10 qod; we will continue to wean. ~  Labs 1/13 in [place] showed Ca= 9.7 range on Pred 10-5 Qod;  We decided to wean Pred to 5mg /d...  GERD - on NEXIUM 40mg  Bid... w/ increased reflux symptoms w/ noct cough etc...  ~  2/10: discussed optimal Rx w/ Nex before dinner, Zantac300 + Reglan10 at bed, elev HOB, etc. ~  2/11: she stopped the Reglan, still using Nexium/ Zantac but w/ persist symptoms>  refer to GI for eval. ~  6/11: GI f/u DrPerry w/ EGD that was normal... continue Rx. ~  12/11:  increased GI symptoms post hosp> she will f/u w/ DrPerry for his input> incr Nexium Bid.  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 ~  6/11:  f/u colonoscopy by DrPerry showed divertics, 4 polyps, AVM.Marland KitchenMarland Kitchen path= tubular adenoma, f/u 60yrs.  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 ~  Note: Serum Calcium ret to normal immediately after spleen removed...  RENAL  INSUFFICIENCY (ICD-588.9) >> Creat ~ 2.0 & eval 10/11 by DrFox- prob due to DM & HBP... ~  Labs 4/12 showed BUN= 27, Creat= 1.6 ~  Labs 7/12 showed BUN= 44, Creat= 1.9 (not on diuretics/ NSAIDs/ etc)... ~  Labs showed Creat up to 2.3 when Ca=13.4; now improved to 1.8 w/ Ca coming down... ~  Labs 11/12 showed BUN=38, Creat=1.7; rec to maintain hydration... ~  Labs 1/13 in [place] w/ diuresis showed Creat incr to 2.4 but ret to 1.7 w/ adjustment in meds...  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 on LYRICA 100mg  Qhs, off prev Tramadol Rx... ~  2010 eval by DrHiatt @ [facility] on Tramadol 50mg , Lyrica 75mg Bid...  GOUT, UNSPECIFIED (ICD-274.9) - Uric in the 7-8 range...on ALLOPURINOL 300mg /d for prevention...  ANXIETY (ICD-300.00) - she is under mod stress- work, mother died, hospice counselling, etc... ~  8/11:  rec starting ALPRAZOLAM 0.5mg  Tid for palpit, SOB, anxiety...  ANEMIA-NOS & MGUS >> see eval 10-11/11 by DrGranfortuna w/ bone marrow 11/11 in hosp= neg. ~  Labs 4/12 showed Hg= 11.6, MCV= 84 ~  Labs 7/12 showed Hg= 12.8, MCV= 90, Fe= 45 (14%sat)... ~  Labs 1/13 in [place] showed Hg=10.4 but improved to 12.9 on  post hosp check in office...  PSORIASIS - eval and Rx per DrTafeen prev on HUMIRA injections- stopped 9/11...   Past Surgical History  Procedure Date  . Cholecystectomy   . Vesicovaginal fistula closure w/  total abdominal hysterectomy   . 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 Tonsillectomy   . Appendectomy   . Abdominal hysterectomy   . Breast reduction surgery 1982  . Open splenectomy [id number]    by DrRosenbower    Outpatient Encounter Prescriptions as of 08/16/2011  Medication Sig Dispense Refill  . ADVAIR DISKUS 250-50 MCG/DOSE AEPB inhale 1 dose by mouth twice a day  60 each  5  . albuterol (PROAIR HFA) 108 (90 BASE) MCG/ACT inhaler Inhale 2 puffs into the lungs every 6  (six) hours as needed.        Marland Kitchen allopurinol (ZYLOPRIM) 300 MG tablet take 1 tablet by mouth once daily  30 tablet  PRN  . amLODipine (NORVASC) 10 MG tablet take 1 tablet by mouth once daily  30 tablet  PRN  . aspirin 81 MG tablet Take 81 mg by mouth daily.        Marland Kitchen atorvastatin (LIPITOR) 20 MG tablet take 1 tablet by mouth once daily  30 tablet  PRN  . cholecalciferol (VITAMIN D) 1000 UNITS tablet Take 1,000 Units by mouth daily.        . cloNIDine (CATAPRES) 0.1 MG tablet take 1 tablet by mouth twice a day  60 tablet  5  . ferrous sulfate 325 (65 FE) MG tablet take 1 tablet by mouth once daily WITH A MEAL  30 tablet  6  . fluticasone (FLONASE) 50 MCG/ACT nasal spray 2 sprays by Nasal route at bedtime.        . furosemide (LASIX) 40 MG tablet Take 0.5 tablets (20 mg total) by mouth daily.  30 tablet  0  . hydrALAZINE (APRESOLINE) 10 MG tablet Take 1 tablet (10 mg total) by mouth 3 (three) times daily.  90 tablet  0  . insulin detemir (LEVEMIR) 100 UNIT/ML injection Inject 46 Units into the skin daily.       . insulin glulisine (APIDRA SOLOSTAR) 100 UNIT/ML injection Inject 10-12 Units into the skin 3 (three) times daily before meals. Use sliding scale as directed      . ketorolac (ACULAR PF) 0.5 % ophthalmic solution Place 1 drop into both eyes 3 (three) times daily.        Marland Kitchen LORazepam (ATIVAN) 1 MG tablet Take 1/2 to 1 tablet by mouth three times daily as needed for nerves  90 tablet  5  . metoprolol tartrate (LOPRESSOR) 25 MG tablet take 1/2 tablet by mouth once daily  30 tablet  2  . NEXIUM 40 MG capsule take 1 capsule by mouth twice a day before meals  30 capsule  11  . polyethylene glycol (MIRALAX / GLYCOLAX) packet Take 17 g by mouth daily.        . predniSONE (DELTASONE) 20 MG tablet Take 2 tablets (40 mg total) by mouth daily.  8 tablet  0  . pregabalin (LYRICA) 100 MG capsule Take 1 capsule (100 mg total) by mouth at bedtime.  30 capsule  5  . sitaGLIPtin (JANUVIA) 50 MG tablet Take 1  tablet (50 mg total) by mouth daily.  30 tablet  11  . glimepiride (AMARYL) 4 MG tablet Take 1 tablet (4 mg total) by mouth daily before breakfast.  30  tablet  6  . predniSONE (DELTASONE) 10 MG tablet Take 0.5-1 tablets (5-10 mg total) by mouth daily. Pt takes 5mg  alternating with 10mg  daily  30 tablet  0    Allergies  Allergen Reactions  . Avelox (Moxifloxacin Hcl In Nacl)     GI upset  . Codeine Other (See Comments)    "crazy"   . Guaifenesin     REACTION: pt states Nausea \\T \ Vomiting  . Latex     REACTION: SWELLING  . Oxycodone     REACTION: N/V    Current Medications, Allergies, Past Medical History, Past Surgical History, Family History, and Social History were reviewed in [employer] record.   Review of Systems         See HPI - all other systems neg except as noted... The patient complains of dyspnea on exertion.  The patient denies anorexia, fever, weight loss, weight gain, vision loss, decreased hearing, hoarseness, chest pain, syncope, peripheral edema, prolonged cough, headaches, hemoptysis, abdominal pain, melena, hematochezia, severe indigestion/heartburn, hematuria, incontinence, muscle weakness, suspicious skin lesions, transient blindness, difficulty walking, depression, unusual weight change, abnormal bleeding, enlarged lymph nodes, and angioedema.     Objective:   Physical Exam      WD, Overweight, 65 y/o BF in NAD...  GENERAL:  Alert & oriented; pleasant & cooperative... HEENT:  [place]/AT, EOM-wnl, PERRLA, EACs-clear, TMs-wnl, NOSE-clear, THROAT- clear & wnl... NECK:  Supple w/ fair ROM; no JVD; normal carotid impulses w/o bruits; palp thyroid, w/o nodules felt; no lymphadenopathy. CHEST:  Clear to P & A; without wheezes/ rales/ or rhonchi. HEART:  Regular Rhythm; gr 1/6 SEM, without rubs/ or gallops. ABDOMEN:  Scar from splenectomy surg; obese, soft, & nontender w/ panniculus; normal bowel sounds; no organomegaly or masses detected EXT:  without deformities, mod arthritic changes, s/p bilat TKRs; no varicose veins/ +venous insuffic/ tr edema. NEURO:  CN's intact; no focal neuro deficits... DERM:  Psoriasis rash per DrTafeen...  RADIOLOGY DATA:  Reviewed in the EPIC EMR & discussed w/ the patient...  LABORATORY DATA:  Reviewed in the EPIC EMR & discussed w/ the patient...   Assessment & Plan:   DIASTOLIC CHF>  2DEcho w/ Gr 2 DD & she was diuresed, now Lasix backed off to 20mg /d & BNP, Creat etc all improved...  SARCOIDOSIS>  Stable underlying Sarcoidosis; main manifestation has been her hypercalcemia (see below)...  Hypercalcemia>  Initially responded to splenectomy but her calcium level rose again over several months after the surg; we were forced to use Pred rx in this difficult diabetic; we had her see DrAltheimer for DM/ endocrine consult; calcium levels have responded nicely to Pred Rx & we are currently at Pred 10-5 Qod; Calcium level is stable at 9.7; REC> decr further to Pred 5mg /d now...  HBP/ CAD>  Meds adjusted during recent Hosp- Lasix restarted & adjusted down to 20mg /d at disch; Diovan stopped; BP= 146/70 on Amlodip10 + Lasix20, continue same for now...  DM>  Control has been difficult;  Stressed diet/ exercise, & get wt down; DrA has her on Levemir & ApidraSS coverage...  Renal Insuffic>  Creat was up to 2.4 in [place] & now back to 1.7 on the Lasix20mg ...  Other medical problems as noted...   Patient's Medications  New Prescriptions   DIPHENHYD-HYDROCORT-NYSTATIN (FIRST-DUKES MOUTHWASH) SUSP    1 tsp gargle and swallow four times daily as needed  Previous Medications   ADVAIR DISKUS 250-50 MCG/DOSE AEPB    inhale 1 dose by mouth twice  a day   ALBUTEROL (PROAIR HFA) 108 (90 BASE) MCG/ACT INHALER    Inhale 2 puffs into the lungs every 6 (six) hours as needed.     ALLOPURINOL (ZYLOPRIM) 300 MG TABLET    take 1 tablet by mouth once daily   AMLODIPINE (NORVASC) 10 MG TABLET    take 1 tablet by mouth once daily    ASPIRIN 81 MG TABLET    Take 81 mg by mouth daily.     ATORVASTATIN (LIPITOR) 20 MG TABLET    take 1 tablet by mouth once daily   CHOLECALCIFEROL (VITAMIN D) 1000 UNITS TABLET    Take 1,000 Units by mouth daily.     CLONIDINE (CATAPRES) 0.1 MG TABLET    take 1 tablet by mouth twice a day   FERROUS SULFATE 325 (65 FE) MG TABLET    take 1 tablet by mouth once daily WITH A MEAL   FLUTICASONE (FLONASE) 50 MCG/ACT NASAL SPRAY    2 sprays by Nasal route at bedtime.     FUROSEMIDE (LASIX) 40 MG TABLET    Take 0.5 tablets (20 mg total) by mouth daily.   GLIMEPIRIDE (AMARYL) 4 MG TABLET    Take 1 tablet (4 mg total) by mouth daily before breakfast.   INSULIN DETEMIR (LEVEMIR) 100 UNIT/ML INJECTION    Inject 46 Units into the skin daily.    INSULIN GLULISINE (APIDRA SOLOSTAR) 100 UNIT/ML INJECTION    Inject 10-12 Units into the skin 3 (three) times daily before meals. Use sliding scale as directed   KETOROLAC (ACULAR PF) 0.5 % OPHTHALMIC SOLUTION    Place 1 drop into both eyes 3 (three) times daily.     LORAZEPAM (ATIVAN) 1 MG TABLET    Take 1/2 to 1 tablet by mouth three times daily as needed for nerves   METOPROLOL TARTRATE (LOPRESSOR) 25 MG TABLET    take 1/2 tablet by mouth once daily   NEXIUM 40 MG CAPSULE    take 1 capsule by mouth twice a day before meals   POLYETHYLENE GLYCOL (MIRALAX / GLYCOLAX) PACKET    Take 17 g by mouth daily.     SITAGLIPTIN (JANUVIA) 50 MG TABLET    Take 1 tablet (50 mg total) by mouth daily.  Modified Medications   Modified Medication Previous Medication   HYDRALAZINE (APRESOLINE) 10 MG TABLET hydrALAZINE (APRESOLINE) 10 MG tablet      Take 1 tablet (10 mg total) by mouth 3 (three) times daily.    Take 1 tablet (10 mg total) by mouth 3 (three) times daily.   LYRICA 100 MG CAPSULE pregabalin (LYRICA) 100 MG capsule      take 1 capsule by mouth at bedtime    Take 1 capsule (100 mg total) by mouth at bedtime.   PREDNISONE (DELTASONE) 10 MG TABLET predniSONE (DELTASONE) 10  MG tablet      Take 1/2 tablet by mouth daily    Take 0.5-1 tablets (5-10 mg total) by mouth daily. Pt takes 5mg  alternating with 10mg  daily  Discontinued Medications   PREDNISONE (DELTASONE) 20 MG TABLET    Take 2 tablets (40 mg total) by mouth daily.

## 2011-09-15 ENCOUNTER — Other Ambulatory Visit: Payer: Self-pay | Admitting: Pulmonary Disease

## 2011-09-15 ENCOUNTER — Telehealth: Payer: Self-pay | Admitting: Pulmonary Disease

## 2011-09-15 MED ORDER — HYDRALAZINE HCL 10 MG PO TABS: 10.0000 mg | ORAL_TABLET | Freq: Three times a day (TID) | ORAL | Status: DC

## 2011-09-15 NOTE — Telephone Encounter (Signed)
Per SN---ok to send in refill of the med   #90 with 5 refills.  1 po tid .  This has been sent to the pts pharmacy and pt is aware. lmom to make pt aware.

## 2011-09-15 NOTE — Telephone Encounter (Signed)
Pt needs a refill on her hydralazine 10 mg. This was giving to her in the hospital for 1 po TID #90 x 0 refills. Please advise if okay to refill Dr. Kriste Basque, thanks

## 2011-09-20 ENCOUNTER — Encounter: Payer: Self-pay | Admitting: Pulmonary Disease

## 2011-09-21 ENCOUNTER — Telehealth: Payer: Self-pay | Admitting: Pulmonary Disease

## 2011-09-21 ENCOUNTER — Other Ambulatory Visit: Payer: Self-pay | Admitting: Pulmonary Disease

## 2011-09-21 MED ORDER — AZITHROMYCIN 250 MG PO TABS: ORAL_TABLET | ORAL | Status: AC

## 2011-09-21 MED ORDER — FIRST-DUKES MOUTHWASH MT SUSP: OROMUCOSAL | Status: DC

## 2011-09-21 NOTE — Telephone Encounter (Signed)
Per SN: ok to call in mucinex OTC 1-2 po BID w/ plenty of fluids, saline nasal spray every 1-2 hours while awake, zpak #1 take as directed, MMW #4 oz 1 tsp swish and swallow 4 times a day. I spoke with pt and is aware of SN recs. She voiced her understanding and had no questions.

## 2011-09-21 NOTE — Telephone Encounter (Signed)
Spoke with pt. She c/o "stuffy head", prod cough with clear sputum and sore throat x 5 days.  Has tried otc throat spray without relief. Would like something called in. Please advise, thanks! Allergies  Allergen Reactions  . Avelox (Moxifloxacin Hcl In Nacl)     GI upset  . Codeine Other (See Comments)    "crazy"   . Guaifenesin     REACTION: pt states Nausea \\T \ Vomiting  . Latex     REACTION: SWELLING  . Oxycodone     REACTION: N/V

## 2011-10-05 ENCOUNTER — Other Ambulatory Visit: Payer: Self-pay | Admitting: Pulmonary Disease

## 2011-10-05 MED ORDER — LORAZEPAM 1 MG PO TABS: ORAL_TABLET | ORAL | Status: DC

## 2011-10-06 ENCOUNTER — Ambulatory Visit (INDEPENDENT_AMBULATORY_CARE_PROVIDER_SITE_OTHER): Payer: BC Managed Care – PPO | Admitting: Pulmonary Disease

## 2011-10-06 ENCOUNTER — Other Ambulatory Visit (INDEPENDENT_AMBULATORY_CARE_PROVIDER_SITE_OTHER): Payer: BC Managed Care – PPO

## 2011-10-06 ENCOUNTER — Encounter: Payer: Self-pay | Admitting: Pulmonary Disease

## 2011-10-06 VITALS — BP 150/80 | HR 68 | Temp 97.3°F | Ht 62.5 in | Wt 205.4 lb

## 2011-10-06 DIAGNOSIS — K219 Gastro-esophageal reflux disease without esophagitis: Secondary | ICD-10-CM

## 2011-10-06 DIAGNOSIS — I10 Essential (primary) hypertension: Secondary | ICD-10-CM

## 2011-10-06 DIAGNOSIS — E669 Obesity, unspecified: Secondary | ICD-10-CM

## 2011-10-06 DIAGNOSIS — J45909 Unspecified asthma, uncomplicated: Secondary | ICD-10-CM

## 2011-10-06 DIAGNOSIS — D649 Anemia, unspecified: Secondary | ICD-10-CM

## 2011-10-06 DIAGNOSIS — I5030 Unspecified diastolic (congestive) heart failure: Secondary | ICD-10-CM

## 2011-10-06 DIAGNOSIS — M1990 Unspecified osteoarthritis, unspecified site: Secondary | ICD-10-CM

## 2011-10-06 DIAGNOSIS — I679 Cerebrovascular disease, unspecified: Secondary | ICD-10-CM

## 2011-10-06 DIAGNOSIS — D869 Sarcoidosis, unspecified: Secondary | ICD-10-CM

## 2011-10-06 DIAGNOSIS — N259 Disorder resulting from impaired renal tubular function, unspecified: Secondary | ICD-10-CM

## 2011-10-06 DIAGNOSIS — E119 Type 2 diabetes mellitus without complications: Secondary | ICD-10-CM

## 2011-10-06 DIAGNOSIS — I2510 Atherosclerotic heart disease of native coronary artery without angina pectoris: Secondary | ICD-10-CM

## 2011-10-06 LAB — BASIC METABOLIC PANEL
Chloride: 103 mEq/L (ref 96–112)
GFR: 41.79 mL/min — ABNORMAL LOW (ref >60.00)
Glucose, Bld: 135 mg/dL — ABNORMAL HIGH (ref 70–99)
Potassium: 4.1 mEq/L (ref 3.5–5.1)
Sodium: 138 mEq/L (ref 135–145)

## 2011-10-06 LAB — BRAIN NATRIURETIC PEPTIDE: Pro B Natriuretic peptide (BNP): 128 pg/mL — ABNORMAL HIGH (ref 0.0–100.0)

## 2011-10-06 MED ORDER — FLUTICASONE PROPIONATE 50 MCG/ACT NA SUSP: 2.0000 | Freq: Every day | NASAL | Status: DC

## 2011-10-06 MED ORDER — HYDROCOD POLST-CHLORPHEN POLST 10-8 MG/5ML PO LQCR: ORAL | Status: DC

## 2011-10-06 MED ORDER — FUROSEMIDE 40 MG PO TABS: 20.0000 mg | ORAL_TABLET | Freq: Every day | ORAL | Status: DC

## 2011-10-06 MED ORDER — LORAZEPAM 1 MG PO TABS: ORAL_TABLET | ORAL | Status: DC

## 2011-10-06 NOTE — Progress Notes (Signed)
Subjective:    Patient ID: [name], female    DOB: 04/19/1946, 66 y.o.   MRN: [id number]  HPI 66 y/o BF here for a follow up visit... he has multiple medical problems as noted below...    ~  November 24, 2010:  [name], no evid malignancy;  Post-op her calcium level returned to normal w/o further intervention;  She had several post-op complications including a subphrenic abscess that required percutaneous drainage, and a left pleural effusion/ basilar atelectasis (s/p thoracentesis & improved)... She has improved slowly at home w/ family help & is planning return to work 11/30/10;  She notes sl cough w/ sm amt thick beige sput, & SOB/ weakness is gradually better;  Denies CP etc, notes insomnia & anxiety...  CXR today w/ improved left base, no clear baseline film;  Labs show Hg=11.6, BS=117, Creat=1.6, FLP looks good, LFTs are OK x sl incr AlkPhos, but note Ca=11.1 (was 8.6 one month ago)...  ~  March 01, 2011:  8mo ROV & she presents w/ recent cough, congestion, green drainage, and tired/ SOB/ etc; she notes incr arthritis pain, aching/ sore and some constipation;  We discussed checking f/u CXR (clear, NAD); and Labs- CBC-ok, BS=167, A1c=8.4, BUN=44, Creat=1.9, Ca=12.0, Phos=4.0, AlkPhos=167, GGT=151, ACE=78;  We discussed Rx w/ Avelox, Mucinex, Fluids; and incr Miralax/ Senakot-S/ MOM for the constipation...    Recurrent hypercalcemia most likely from active Sarcoidosis> labs w/ incr AlkPhos & GGT> we will image the liver at this time to compare to prev; and consult w/ Endocrinology for their opinion regarding the hypercalcemia AND her IDDM (esp in view of poss Prednisone rx for the calcium & sarcoid)...    Renal Insuffic followed by DrFox for Nephrology> Creat up to 1.9 from 1.6 last, needs incr fluid intake, she knows to avoid nephrotoxic agents/ meds/ etc...    She has known MGUS followed by  DrGranfortuna but prev bone marrow was neg etc; due for MGUS labs this fall, current CBC is stable: Hg=12.8, Fe=45...  ~  May 27, 2011:  8mo ROV & she has established w/ DrAltheimer for Endocrine on Levemir & Apidra, along w/ Glimepiride & Januvia; we called in Prednisone 20mg /d in early Oct to start rx for her high calcium levels & labs from DrA's office reviewed >> Calcium has improved to 10.3 on 3wks of oral Pred...    Sarcoidosis w/ HYPERCALCEMIA> Ca++ was 12.4 -13.4 in Aug & Sept; since starting Pred20mg /d in early Oct her Ca++ has improved to 10.3 now; we decided to slowly taper the Pred in light of her IDDM & improved Ca++ level (try 20mg , alt w/ 10mg  Qod)...    HBP> on Norvasc & Diovan; BP= 132/80 & she denies CP, palpit, dizzy, ch in SOB/ DOE, edema, etc...    CHOL> on Lip20; FLP 4/12 looked good & we reviewed low fat diet restrictions...    DM on Insulin> as above, followed by DrA on Levemir30 & ApidraSS coverage at meals; BS at home varies 150-400+, FBS today=80, latest A1c was 7.6 in Aug...    Obesity> wt= 180# (63"Tall & BMI=32) which is up 8# and we reviewed diet, exercise, & wt reduction strategies...    GI> GERD on NexiumBid; and we reviewed antireflux regimen (elev HOB, npo after dinner, etc)...    Renal Insuffic> Creat had incr to 2.3 when Ca++ rose to 13.4, but has returned to 1.8 now; advised incr  free water intake...    Anxiety> on Alprazolam prn; she's been under extra stress w/ loss of job, this illness, family issues...  ~  June 28, 2011:  43mo ROV> on Pred 20-10 Qod for the last month and BMet today shows Ca++=9.8;  She is anxious to wean the Pred as quickly as poss due to incr appetite, weight gain, difficulty controlling her DM, etc;  She has been seeing DrAltheimer every 3-4wks w/ adjustments in her insulin> currently on Levemir 46u qd; Apidra 10+SS Tid at meals, Amaryl & Januvia;  BS's vary tremendously & she is struggling w/ diet (weight up 8# in the last month to  188# today);  We decided to cut the Pred to 10mg /d now and then further to 10-5 qod after 3wks w/ ROV in 6 weeks...  ~  August 16, 2011:  6wk ROV & post hosp check> she was Adm by [facility] 1/8 - 08/12/11 w/ dyspnea (DOE, orthopnea, edema) thought to be due to diastolic heart failure (2DEcho w/ Gr2DD, BNP=2200), she was diuresed & improved, disch on LASIX40mg - 1/2 tab each am & her prev Diovan was stopped (due to renal insuffic w/ Creat=2.4)...  Her serum Calcium levels were all norm 9.6-9.8;  A1c=9.3;  Hg=10.4>> See below...    Feeling better since disch;  Weight 196# is still 8# heavier than her last visit w/ me 11/12;  We decided to decr her Pred from 10-5 Qod to 5mg /d & keep Lasix at 20mg /d...    Follow up Labs today showed:  BUN=33, Creat=1.7, BNP=100;  Ca=9.7;  Hg=12.9;  BS=69 & she will f/u w/ DrAltheimer...  ~  October 06, 2011:  86mo ROV on Pred 5mg /d & generally stable but w/ mult minor somatic complaints- sl SOB, some cough, thick white mucous, right arm discomfort, & reflux symptoms;  She had a ZPak called in about 2wks ago;  She is on a good antireflux regimen, plus Flonase & Saline;  We will add Tussionex for prn use;  Unfortunately weight is up 9# further tp 205# today;  BP remains controlled on 5 MEDS (see below);  She continues to f/u w/ DrAltheimer for DM> on Levimir 46u & Apidra 03-15-13;  We decided to cut the Pred to 5mg  QOD & keep the Lasix 20mg /d... LABS 3/13:  BS=135,  BUN=38, Creat=1.6;  Ca=10.1;  ACE=48;  BNP=128...         Problem List:    OBSTRUCTIVE SLEEP APNEA - Sleep Study 11/07 showed RDI=21 w/ desat to 62% during REM.Marland Kitchen. eval by DrSood w/ Rx for CPAP 12... compliance poor- only using it 1-2 times/wk... encouraged to use CPAP more regularly & to f/u w/ DrSood regarding the mask interface problems... ~  8/11:  she reports new mask but still not using CPAP regularly, "I rest fairly well". ~  7/12:  She reports not using her CPAP, and not resting well... ~  1/13:  CPAP used briefly  during [place] for diastolic CHF, but still not compliant w/ home use...  BRONCHITIS, ACUTE - on ADVAIR 250Bid (using Prn only) & PROAIR as needed. 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 ~  CXR 1/09 was pre-op for TKR- sl cardomeg & ?mild vasc congestion. ~  CXR 2/11 showed norm heart size & vascularity, clear, s/p cholecystectomy, DJD in TSpine. ~  CXR & CT Chest 11/11 showed sl peribronch thickening, no lung lesions, no signif adenopathy. ~  PET scan 1/12 showed hypermetabolic activ in spleen & lymph nodes  in the supraclav, hilar, mediastinal, periaotic & iliac region as well. ~  CXRs 3/12 in the perioperative period after splenectomy showed left effusion==> tapped & improved aeration. ~  CXR 4/12 back to baseline w/ clear bases, essent wnl... ~  CXR 7/12 showed norm heart size, clear lungs, no definite adenopathy, NAD... ~  LABS 7/12 w/ ACE=78 (8-52), Ca=12.0, AlkPhos=176 (39-117), GGT=151 (7-51) ~  Labs from DrAltheimer> 8/12: Ca=12.4, Creat=1.8; then 9/12: Ca=13.4, Creat=2.3;  ~  10/12:  PREDNISONE 20mg /d started 10/12 & 3wks later Ca=10.3, Creat=1.8; therefore weaned to 20-10 Qod. ~  11/12: Labs on Pred 20-10 Qod showed Ca++= 9.8; we will continue to wean Pred. ~  1/13:  Labs during [place] showed Ca=9.7 & we decided to wean Pred to 5mg /d... ~  3/13:  Labs showed Ca=10.1 on Pred 5mg /d; decided to wean to 5mg  Qod...  HYPERTENSION - taking METOPROLOL 25mg - 1/2 daily, NORVASC 10mg /d, APRESOLINE 10mg Tid, CLONIDINE 0.1mg Bid, LASIX 40mg - 1/2 daily; off prev [id number] due to azotemia w/ diuresis. ~  4/12:  BP here 132/62 and she denies HA, visual changes, CP, palipit, dizziness, syncope, change in dyspnea, etc... ~  10/12:  BP= 132/80 & she denies CP, palpit, dizzy, ch in SOB/ DOE, edema, etc... ~  11/12:  BP= 132/82 & she is relatively asymptomatic other than DOE from her weight and some musc cramps... ~  1/13: Post Hosp BP= 146/70 on ? -  Metop12.5, Amlod10, Clonid0.1Bid, Lasix20; she denies CP, palpit, SOB, edema> improved from recent hosp... ~  3/13: She called in the interim for "refill" Apresoline not prev on any med list; BP= 150/80 on all 5 meds; must elim sodium & get wt down...  CAD - on ASA 81mg /d...  ~  Hx non-obstructive CAD w/ cath 6/07 by DrStuckey showing luminal irregularities & tiny first marginal branch of the CIRC w/ ostial lesion... good LVF. ~  2DEcho 9/07 showed mildly calcif AoV and normal LVF & wall motion. ~  2DEcho 11/11 showed norm LV wall thickness, norm LVF w/ EF= 60-65%, norm atria, norm valves, trivial peric fluid behind heart. ~  2DEcho 1/13 showed mild LVH, norm LVF w/ EF=55-60% & no regional wall motion abn, Gr2DD, norm RV...  CEREBROVASCULAR DISEASE - she remains on ASA 81mg /d without TIA's or other neuro manifestations...  ~  MRA Br 9/07 showed mod intracranial atherosclerotic changes...   HYPERLIPIDEMIA - on LIPITOR 20mg /d ~  FLP 7/07 showed TChol 114, TG 85, HDL 28, LDL 69 ~  FLP 4/09 showed TChol 154, TG 192, HDL 34, LDL 82... discussed poss of adding fibrate- hold... ~  FLP 2/10 on Lip20 showed TChol 159, TG 207, HDL 29, LDL 83... rec> add Fenoglide (she didn't). ~  FLP 2/11 on Lip20 showed TChol 185, TG 242, HDL 43, LDL 99... add FENOFIBRATE 160mg /d... ~  FLP 8/11 on Lip20+Feno160 showed TChol 167, TG 216, HDL 37, LDL 96... she stopped Feno160. ~  FLP 4/12 on Lip20 showed TChol 146, TG 115, HDL 35, LDL 88  DM - on LEVEMIR & APIDRA SS coverage now per DrAltheimer; plus GLIMEPIRIDE 4mg /d and Januvia 50mg /d. ~  labs 4/08 showed BS=138 & HgA1c=7.5.Marland Kitchen. home BS = 100 to >300... misses doses and not on diet or exercising... ~  1/09 became hypoglycemic in hosp on 4 meds post knee surg- meds adjusted... ~  3/09 restarted GLIMEPIRIDE 4mg - 1/2 tab each AM, & continue Lantus & Metformin... ~  labs 4/09 showed BS= 148, HgA1c= 7.3.Marland Kitchen. rec> back on 4 med regimen due to  poor control at home. ~  6/09  discussed titrating the Lantus up until FBS 100-120 range... ~  labs 2/10 showed BS= 164, HgA1c= 8.6.Marland KitchenMarland Kitchen very disappointing- needs home BS monitoring, incr Lantus. ~  labs 2/11 showed BS= 298, A1c= 8.4...  discussed change Lantus to LEVEMIR 40 u daily... ~  labs 8/11 showed BS= 202, A1c= 10.9.Marland Kitchen. rec> incr Levemir to 50, consider Humalog. ~  Nov-Dec 2011:  BS up w/ adjust of meds & addition of Pred after hosp 11/11 & hypercalcemia... ~  She states she returned to Levemir 40u daily after the splenectomy hosp due to appetite etc;  4/12 BS=117 ~  Labs 7/12 showed BS= 167, A1c= 8.4> on Levemir 50u/d & Amaryl 4mg  Qam (note creat 1.9). ~  8-10/12: on Levemir30 + ApidraSS per DrA & BS are 150-400+ due to the Pred20; A1c 8/12 was 7.6 ~  11/12:  DrA has increased her Levemir to 46u/d & the Apidra to 10+SS cover Tid... BS here= 168 ~  1/13: Covered w/ SSI in Hosp but A1c=9.3 (Creat up to 2.4 but improved to 1.7 w/ adjust meds)... ~  3/13: she continues to follow w/ DrAltheimer on Levimir & Apidra;  BS=135 today...  OBESITY (ICD-278.00) - weight down to 171 after hosp 11/11- doing better on diet, not yet exercising. ~  weight 220-230# in the early 1990's... ~  weight 210-220# in the early 2000's... ~  weight 2/10 = 192# ~  weight 2/11 = 186# ~  weight 8/11 = 174# ~  weight 12/11 = 171# ~  Weight 4/12 = 168# ~  Weight 7/12 = 171# ~  Weight 10/12 = 180# ~  Weight 11/12 = 188# ~  Weight 1/13 = 196# ~  Weight 3/13 = 205#... She MUST get back on diet, incr exercise, get wt down.  HYPERCALCEMIA> this was most likely due to sarcoidosis w/ extensive splenic involvement;  Calcium was as hight as 13.7 and returned to normal after the splenectomy. ~  Labs 4/12 showed calcium = 11.1 & this is very disappointing... ~  Labs 7/12 showed Ca= 12.0, Phos= 4.0, ACE= 78 ~  Labs 8-10/12 showed Ca up to 13.4, then down to 10.3 on PRED20mg /d... ~  Labs 11/12 showed Ca= 9.8 on Pred 20-10 qod; we will continue to wean. ~   Labs 1/13 in [place] showed Ca= 9.7 range on Pred 10-5 Qod;  We decided to wean Pred to 5mg /d... ~  Labs 3/13 showed Ca= 10.1 on Pred5mg /d; rec to decr to 5mg  Qod...  GERD - on NEXIUM 40mg  Bid... w/ increased reflux symptoms w/ noct cough etc...  ~  2/10: discussed optimal Rx w/ Nex before dinner, Zantac300 + Reglan10 at bed, elev HOB, etc. ~  2/11: she stopped the Reglan, still using Nexium/ Zantac but w/ persist symptoms>  refer to GI for eval. ~  6/11: GI f/u DrPerry w/ EGD that was normal... continue Rx. ~  12/11:  increased GI symptoms post hosp> she will f/u w/ DrPerry for his input> incr Nexium Bid.  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 ~  6/11:  f/u colonoscopy by DrPerry showed divertics, 4 polyps, AVM.Marland KitchenMarland Kitchen path= tubular adenoma, f/u 51yrs.  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 ~  Note: Serum Calcium ret to normal immediately after spleen removed...  RENAL  INSUFFICIENCY (ICD-588.9) >> Creat ~ 2.0 & eval 10/11 by DrFox- prob due to DM & HBP... ~  Labs 4/12 showed BUN= 27, Creat= 1.6 ~  Labs 7/12 showed BUN= 44, Creat= 1.9 (not on diuretics/ NSAIDs/ etc)... ~  Labs showed Creat up to 2.3 when Ca=13.4; now improved to 1.8 w/ Ca coming down... ~  Labs 11/12 showed BUN=38, Creat=1.7; rec to maintain hydration... ~  Labs 1/13 in [place] w/ diuresis showed Creat incr to 2.4 but ret to 1.7 w/ adjustment in meds... 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 on LYRICA 100mg  Qhs, off prev Tramadol Rx... ~  2010 eval by DrHiatt @ [facility] on Tramadol 50mg , Lyrica 75mg Bid...  GOUT, UNSPECIFIED (ICD-274.9) - Uric in the 7-8 range...on ALLOPURINOL  300mg /d for prevention...  ANXIETY (ICD-300.00) - she is under mod stress- work, mother died, hospice counselling, etc... ~  8/11:  rec starting ALPRAZOLAM 0.5mg  Tid for palpit, SOB, anxiety...  ANEMIA-NOS & MGUS >> see eval 10-11/11 by DrGranfortuna w/ bone marrow 11/11 in hosp= neg. ~  Labs 4/12 showed Hg= 11.6, MCV= 84 ~  Labs 7/12 showed Hg= 12.8, MCV= 90, Fe= 45 (14%sat)... ~  Labs 1/13 in [place] showed Hg=10.4 but improved to 12.9 on post hosp check in office...  PSORIASIS - eval and Rx per DrTafeen prev on HUMIRA injections- stopped 9/11...   Past Surgical History  Procedure Date  . Cholecystectomy   . Vesicovaginal fistula closure w/  total abdominal hysterectomy   . 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 Tonsillectomy   . Appendectomy   . Abdominal hysterectomy   . Breast reduction surgery 1982  . Open splenectomy [id number]    by DrRosenbower    Outpatient Encounter Prescriptions as of 10/06/2011  Medication Sig Dispense Refill  . ADVAIR DISKUS 250-50 MCG/DOSE AEPB inhale 1 dose by mouth twice a day  60 each  5  . albuterol (PROAIR HFA) 108 (90 BASE) MCG/ACT inhaler Inhale 2 puffs into the lungs every 6 (six) hours as needed.        Marland Kitchen allopurinol (ZYLOPRIM) 300 MG tablet take 1 tablet by mouth once daily  30 tablet  PRN  . amLODipine (NORVASC) 10 MG tablet take 1 tablet by mouth once daily  30 tablet  PRN  . aspirin 81 MG tablet Take 81 mg by mouth daily.        Marland Kitchen atorvastatin (LIPITOR) 20 MG tablet take 1 tablet by mouth once daily  30 tablet  PRN  . cholecalciferol (VITAMIN D) 1000 UNITS tablet Take 1,000 Units by mouth daily.        . cloNIDine (CATAPRES) 0.1 MG tablet take 1 tablet by mouth twice a day  60 tablet  5  . Diphenhyd-Hydrocort-Nystatin (FIRST-DUKES MOUTHWASH) SUSP 1 tsp gargle and swallow four times daily as needed  120 mL  0  . ferrous sulfate 325 (65 FE) MG tablet take 1 tablet by mouth once daily WITH A MEAL  30 tablet  6  .  fluticasone (FLONASE) 50 MCG/ACT nasal spray 2 sprays by Nasal route at bedtime.        . furosemide (LASIX) 40 MG tablet Take 0.5 tablets (20 mg total) by mouth daily.  30 tablet  0  . glimepiride (AMARYL) 4 MG tablet Take 1 tablet (4 mg total) by mouth daily before breakfast.  30 tablet  6  . hydrALAZINE (APRESOLINE) 10 MG tablet Take 1 tablet (10 mg total) by mouth 3 (three) times daily.  90 tablet  5  . insulin detemir (LEVEMIR) 100 UNIT/ML injection Inject 46 Units into the skin daily.       . insulin glulisine (APIDRA SOLOSTAR) 100 UNIT/ML injection Inject 10-12 Units into the skin 3 (three) times daily before meals. Use sliding scale as directed      . ketorolac (ACULAR PF) 0.5 % ophthalmic solution Place 1 drop into both eyes 3 (three) times daily.        Marland Kitchen LORazepam (ATIVAN) 1 MG tablet Take 1/2 to 1 tablet by mouth three times daily as needed for nerves  90 tablet  5  . LYRICA 100 MG capsule take 1 capsule by mouth at bedtime  30 capsule  5  . metoprolol tartrate (LOPRESSOR) 25 MG tablet take 1/2 tablet by mouth once daily  30 tablet  2  . NEXIUM 40 MG capsule take 1 capsule by mouth twice a day before meals  30 capsule  11  . polyethylene glycol (MIRALAX / GLYCOLAX) packet Take 17 g by mouth daily.        . predniSONE (DELTASONE) 10 MG tablet Take 1/2 tablet by mouth daily      . sitaGLIPtin (JANUVIA) 50 MG tablet Take 1 tablet (50 mg total) by mouth daily.  30 tablet  11    Allergies  Allergen Reactions  . Avelox (Moxifloxacin Hcl In Nacl)     GI upset  . Codeine Other (See Comments)    "crazy"   . Guaifenesin     REACTION: pt states Nausea \\T \ Vomiting  . Latex     REACTION: SWELLING  . Oxycodone     REACTION: N/V    Current Medications, Allergies, Past Medical History, Past Surgical History, Family History, and Social History were reviewed in [employer] record.   Review of Systems         See HPI - all other systems neg except as noted... The  patient complains of dyspnea on exertion.  The patient denies anorexia, fever, weight loss, weight gain, vision loss, decreased hearing, hoarseness, chest pain, syncope, peripheral edema, prolonged cough, headaches, hemoptysis, abdominal pain, melena, hematochezia, severe indigestion/heartburn, hematuria, incontinence, muscle weakness, suspicious skin lesions, transient blindness, difficulty walking, depression, unusual weight change, abnormal bleeding, enlarged lymph nodes, and angioedema.     Objective:   Physical Exam      WD, Overweight, 65 y/o BF in NAD...  GENERAL:  Alert & oriented; pleasant & cooperative... HEENT:  [place]/AT, EOM-wnl, PERRLA, EACs-clear, TMs-wnl, NOSE-clear, THROAT- clear & wnl... NECK:  Supple w/ fair ROM; no JVD; normal carotid impulses w/o bruits; palp thyroid, w/o nodules felt; no lymphadenopathy. CHEST:  Clear to P & A; without wheezes/ rales/ or rhonchi. HEART:  Regular Rhythm; gr 1/6 SEM, without rubs/ or gallops. ABDOMEN:  Scar from splenectomy surg; obese, soft, & nontender w/ panniculus; normal bowel sounds; no organomegaly or masses detected EXT: without deformities, mod arthritic changes, s/p bilat TKRs; no varicose veins/ +venous insuffic/ tr edema. NEURO:  CN's intact; no focal neuro deficits... DERM:  Psoriasis rash per DrTafeen...  RADIOLOGY DATA:  Reviewed in the EPIC EMR & discussed w/ the patient...  LABORATORY DATA:  Reviewed in the EPIC EMR & discussed w/ the patient...   Assessment & Plan:   HBP>  BP 150/80 on 5 meds but she didn't bring bottles; continue same  for now & bring bottles to review...  DIASTOLIC CHF>  2DEcho w/ Gr 2 DD & she was diuresed, now Lasix backed off to 20mg /d & BNP, Creat etc all improved...  SARCOIDOSIS>  Stable underlying Sarcoidosis; main manifestation has been her hypercalcemia (see below)...  Hypercalcemia>  Initially responded to splenectomy but her calcium level rose again over several months after the surg; we were  forced to use Pred rx in this difficult diabetic; we had her see DrAltheimer for DM/ endocrine consult; calcium levels have responded nicely to Pred Rx & we are currently at Pred 5mg /d now, CCa=10.1 & we will cut it further to 5mg  Qod.  DM>  Control has been difficult;  Stressed diet/ exercise, & get wt down; DrA has her on Levemir & ApidraSS coverage...  Renal Insuffic>  Creat was up to 2.4 in [place] & now back to 1.6 on the Lasix20mg ...  Other medical problems as noted...   Patient's Medications  New Prescriptions   No medications on file  Previous Medications   ADVAIR DISKUS 250-50 MCG/DOSE AEPB    inhale 1 dose by mouth twice a day   ALBUTEROL (PROAIR HFA) 108 (90 BASE) MCG/ACT INHALER    Inhale 2 puffs into the lungs every 6 (six) hours as needed.     ALLOPURINOL (ZYLOPRIM) 300 MG TABLET    take 1 tablet by mouth once daily   AMLODIPINE (NORVASC) 10 MG TABLET    take 1 tablet by mouth once daily   ASPIRIN 81 MG TABLET    Take 81 mg by mouth daily.     ATORVASTATIN (LIPITOR) 20 MG TABLET    take 1 tablet by mouth once daily   CHOLECALCIFEROL (VITAMIN D) 1000 UNITS TABLET    Take 1,000 Units by mouth daily.     CLONIDINE (CATAPRES) 0.1 MG TABLET    take 1 tablet by mouth twice a day   DIPHENHYD-HYDROCORT-NYSTATIN (FIRST-DUKES MOUTHWASH) SUSP    1 tsp gargle and swallow four times daily as needed   FERROUS SULFATE 325 (65 FE) MG TABLET    take 1 tablet by mouth once daily WITH A MEAL   FLUTICASONE (FLONASE) 50 MCG/ACT NASAL SPRAY    2 sprays by Nasal route at bedtime.     FUROSEMIDE (LASIX) 40 MG TABLET    Take 0.5 tablets (20 mg total) by mouth daily.   GLIMEPIRIDE (AMARYL) 4 MG TABLET    Take 1 tablet (4 mg total) by mouth daily before breakfast.   HYDRALAZINE (APRESOLINE) 10 MG TABLET    Take 1 tablet (10 mg total) by mouth 3 (three) times daily.   INSULIN DETEMIR (LEVEMIR) 100 UNIT/ML INJECTION    Inject 46 Units into the skin daily.    INSULIN GLULISINE (APIDRA SOLOSTAR) 100 UNIT/ML  INJECTION    Inject 10-12 Units into the skin 3 (three) times daily before meals. Use sliding scale as directed   KETOROLAC (ACULAR PF) 0.5 % OPHTHALMIC SOLUTION    Place 1 drop into both eyes 3 (three) times daily.     LORAZEPAM (ATIVAN) 1 MG TABLET    Take 1/2 to 1 tablet by mouth three times daily as needed for nerves   LYRICA 100 MG CAPSULE    take 1 capsule by mouth at bedtime   METOPROLOL TARTRATE (LOPRESSOR) 25 MG TABLET    take 1/2 tablet by mouth once daily   NEXIUM 40 MG CAPSULE    take 1 capsule by mouth twice a day before meals  POLYETHYLENE GLYCOL (MIRALAX / GLYCOLAX) PACKET    Take 17 g by mouth daily.     PREDNISONE (DELTASONE) 10 MG TABLET    Take 1/2 tablet by mouth daily   SITAGLIPTIN (JANUVIA) 50 MG TABLET    Take 1 tablet (50 mg total) by mouth daily.  Modified Medications   No medications on file  Discontinued Medications   No medications on file

## 2011-10-06 NOTE — Patient Instructions (Signed)
Today we updated your med list in our EPIC system...    We decided to DECREASE your PREDNISONE to 5mg  every other day...    We also wrote a new prescription for TUSSIONEX cough syrup- one tsp every 12 H as needed for cough...  Today we did your follow up blood work...    Please call the PHONE [facility]REE in a few days for your results...    Dial [id number] & when prompted enter your patient number followed by the # symbol...    Your patient number is:  [id number]  Let's get on track w/ our diet & exercise program...    The goal is to lose 5-10# initially...  Call for any questions...  Let's plan a follow up visit in 3 months.Marland KitchenMarland Kitchen

## 2011-10-11 ENCOUNTER — Other Ambulatory Visit: Payer: Self-pay | Admitting: Pulmonary Disease

## 2011-11-07 ENCOUNTER — Other Ambulatory Visit: Payer: Self-pay | Admitting: Pulmonary Disease

## 2011-11-08 ENCOUNTER — Telehealth: Payer: Self-pay | Admitting: Pulmonary Disease

## 2011-11-08 MED ORDER — ESOMEPRAZOLE MAGNESIUM 40 MG PO CPDR: 40.0000 mg | DELAYED_RELEASE_CAPSULE | Freq: Two times a day (BID) | ORAL | Status: DC

## 2011-11-08 NOTE — Telephone Encounter (Signed)
Pt aware that Levemir refill has already been sent and I have corrected the Nexium Rx to reflect bid instead of qd.

## 2011-11-10 ENCOUNTER — Other Ambulatory Visit: Payer: Self-pay | Admitting: Pulmonary Disease

## 2011-11-12 ENCOUNTER — Other Ambulatory Visit: Payer: Self-pay | Admitting: Pulmonary Disease

## 2011-11-25 ENCOUNTER — Telehealth: Payer: Self-pay | Admitting: Pulmonary Disease

## 2011-11-25 MED ORDER — HYDROCOD POLST-CHLORPHEN POLST 10-8 MG/5ML PO LQCR: ORAL | Status: DC

## 2011-11-25 NOTE — Telephone Encounter (Signed)
Per SN: mucinex dm 2 po bid, plenty of fluids, tussionex 4oz 1 tsp q12h prn cough.  Thanks.  Called spoke with patient, advised her of SN's recommendations.  Pt stated that she is already taking the mucinex dm and will continue this with plenty of fluids.  tussionex telephoned to verified pharmacy.  Pt to call back if symptoms do not improve or worsen.

## 2011-11-25 NOTE — Telephone Encounter (Signed)
Spoke with pt. She c/o cough x 6 days- mainly dry but for the past 2 days has been able to produce "thick chunks" of green to white sputum. She states that her head also feels stuffy and just overall feels bad. No fever, aches, CP, SOB or other co's. She states that she has been using her tussionex and this usually helps, but not helping this time. Please advise, thanks! Allergies  Allergen Reactions  . Avelox (Moxifloxacin Hcl In Nacl)     GI upset  . Codeine Other (See Comments)    "crazy"   . Guaifenesin     REACTION: pt states Nausea \\T \ Vomiting  . Latex     REACTION: SWELLING  . Oxycodone     REACTION: N/V

## 2011-11-30 ENCOUNTER — Other Ambulatory Visit: Payer: Self-pay | Admitting: Pulmonary Disease

## 2011-12-07 ENCOUNTER — Other Ambulatory Visit: Payer: Self-pay | Admitting: Pulmonary Disease

## 2011-12-14 ENCOUNTER — Other Ambulatory Visit: Payer: Self-pay | Admitting: Pulmonary Disease

## 2011-12-14 NOTE — Telephone Encounter (Signed)
Please advise if ok to refill. Thanks 

## 2011-12-24 ENCOUNTER — Encounter: Payer: Self-pay | Admitting: Pulmonary Disease

## 2012-01-06 ENCOUNTER — Encounter: Payer: Self-pay | Admitting: Pulmonary Disease

## 2012-01-06 ENCOUNTER — Other Ambulatory Visit (INDEPENDENT_AMBULATORY_CARE_PROVIDER_SITE_OTHER): Payer: BC Managed Care – PPO

## 2012-01-06 ENCOUNTER — Ambulatory Visit (INDEPENDENT_AMBULATORY_CARE_PROVIDER_SITE_OTHER): Payer: BC Managed Care – PPO | Admitting: Pulmonary Disease

## 2012-01-06 VITALS — BP 142/80 | HR 83 | Temp 96.7°F | Ht 62.5 in | Wt 205.0 lb

## 2012-01-06 DIAGNOSIS — E785 Hyperlipidemia, unspecified: Secondary | ICD-10-CM

## 2012-01-06 DIAGNOSIS — J209 Acute bronchitis, unspecified: Secondary | ICD-10-CM

## 2012-01-06 DIAGNOSIS — E119 Type 2 diabetes mellitus without complications: Secondary | ICD-10-CM

## 2012-01-06 DIAGNOSIS — D472 Monoclonal gammopathy: Secondary | ICD-10-CM

## 2012-01-06 DIAGNOSIS — R198 Other specified symptoms and signs involving the digestive system and abdomen: Secondary | ICD-10-CM

## 2012-01-06 DIAGNOSIS — D649 Anemia, unspecified: Secondary | ICD-10-CM

## 2012-01-06 DIAGNOSIS — I10 Essential (primary) hypertension: Secondary | ICD-10-CM

## 2012-01-06 DIAGNOSIS — N259 Disorder resulting from impaired renal tubular function, unspecified: Secondary | ICD-10-CM

## 2012-01-06 DIAGNOSIS — M1990 Unspecified osteoarthritis, unspecified site: Secondary | ICD-10-CM

## 2012-01-06 DIAGNOSIS — I2510 Atherosclerotic heart disease of native coronary artery without angina pectoris: Secondary | ICD-10-CM

## 2012-01-06 DIAGNOSIS — F411 Generalized anxiety disorder: Secondary | ICD-10-CM

## 2012-01-06 DIAGNOSIS — D869 Sarcoidosis, unspecified: Secondary | ICD-10-CM

## 2012-01-06 DIAGNOSIS — E669 Obesity, unspecified: Secondary | ICD-10-CM

## 2012-01-06 DIAGNOSIS — K5730 Diverticulosis of large intestine without perforation or abscess without bleeding: Secondary | ICD-10-CM

## 2012-01-06 DIAGNOSIS — K219 Gastro-esophageal reflux disease without esophagitis: Secondary | ICD-10-CM

## 2012-01-06 LAB — BASIC METABOLIC PANEL
BUN: 41 mg/dL — ABNORMAL HIGH (ref 6–23)
CO2: 27 mEq/L (ref 19–32)
Chloride: 105 mEq/L (ref 96–112)
Glucose, Bld: 169 mg/dL — ABNORMAL HIGH (ref 70–99)
Potassium: 4 mEq/L (ref 3.5–5.1)
Sodium: 140 mEq/L (ref 135–145)

## 2012-01-06 LAB — CBC WITH DIFFERENTIAL/PLATELET
Basophils Absolute: 0.1 10*3/uL (ref 0.0–0.1)
HCT: 39.2 % (ref 36.0–46.0)
Hemoglobin: 12.4 g/dL (ref 12.0–15.0)
Lymphs Abs: 2 10*3/uL (ref 0.7–4.0)
MCHC: 31.8 g/dL (ref 30.0–36.0)
MCV: 93.1 fl (ref 78.0–100.0)
Monocytes Absolute: 1.4 10*3/uL — ABNORMAL HIGH (ref 0.1–1.0)
Neutro Abs: 8.1 10*3/uL — ABNORMAL HIGH (ref 1.4–7.7)
Platelets: 418 10*3/uL — ABNORMAL HIGH (ref 150.0–400.0)
RDW: 16.6 % — ABNORMAL HIGH (ref 11.5–14.6)

## 2012-01-06 MED ORDER — INSULIN PEN NEEDLE 32G X 4 MM MISC: Status: DC

## 2012-01-06 MED ORDER — AZITHROMYCIN 250 MG PO TABS: ORAL_TABLET | ORAL | Status: AC

## 2012-01-06 NOTE — Patient Instructions (Addendum)
Today we updated your med list in our EPIC system...    Continue your current medications the same...  We wrote a new prescription for a ZPak to take for your bronchitic infection...  Today we did your follow up lab work...    We will call you w/ the results when avail...  Keep up the good work w/ diet & exercise...    The goal is to lose 10-15 lbs...  Call for any questions...  Let's plan a follow up visit in 3-4 months...  ADDENDUM>> after labs returned we decided to STOP Lasix20 for now, INCREASE PRED 5mg  - 2tabs QAM for now.

## 2012-01-06 NOTE — Progress Notes (Signed)
Subjective:    Patient ID: [name], female    DOB: [birth year], 66 y.o.   MRN: [id number]  HPI 66 y/o BF here for a follow up visit... he has multiple medical problems as noted below...    ~  November 24, 2010:  [name], no evid malignancy;  Post-op her calcium level returned to normal w/o further intervention;  She had several post-op complications including a subphrenic abscess that required percutaneous drainage, and a left pleural effusion/ basilar atelectasis (s/p thoracentesis & improved)... She has improved slowly at home w/ family help & is planning return to work 11/30/10;  She notes sl cough w/ sm amt thick beige sput, & SOB/ weakness is gradually better;  Denies CP etc, notes insomnia & anxiety...  CXR today w/ improved left base, no clear baseline film;  Labs show Hg=11.6, BS=117, Creat=1.6, FLP looks good, LFTs are OK x sl incr AlkPhos, but note Ca=11.1 (was 8.6 one month ago)...  ~  March 01, 2011:  92mo ROV & she presents w/ recent cough, congestion, green drainage, and tired/ SOB/ etc; she notes incr arthritis pain, aching/ sore and some constipation;  We discussed checking f/u CXR (clear, NAD); and Labs- CBC-ok, BS=167, A1c=8.4, BUN=44, Creat=1.9, Ca=12.0, Phos=4.0, AlkPhos=167, GGT=151, ACE=78;  We discussed Rx w/ Avelox, Mucinex, Fluids; and incr Miralax/ Senakot-S/ MOM for the constipation...    Recurrent hypercalcemia most likely from active Sarcoidosis> labs w/ incr AlkPhos & GGT> we will image the liver at this time to compare to prev; and consult w/ Endocrinology for their opinion regarding the hypercalcemia AND her IDDM (esp in view of poss Prednisone rx for the calcium & sarcoid)...    Renal Insuffic followed by DrFox for Nephrology> Creat up to 1.9 from 1.6 last, needs incr fluid intake, she knows to avoid nephrotoxic agents/ meds/ etc...    She has known MGUS followed by  DrGranfortuna but prev bone marrow was neg etc; due for MGUS labs this fall, current CBC is stable: Hg=12.8, Fe=45...  ~  May 27, 2011:  92mo ROV & she has established w/ DrAltheimer for Endocrine on Levemir & Apidra, along w/ Glimepiride & Januvia; we called in Prednisone 20mg /d in early Oct to start rx for her high calcium levels & labs from DrA's office reviewed >> Calcium has improved to 10.3 on 3wks of oral Pred...    Sarcoidosis w/ HYPERCALCEMIA> Ca++ was 12.4 -13.4 in Aug & Sept; since starting Pred20mg /d in early Oct her Ca++ has improved to 10.3 now; we decided to slowly taper the Pred in light of her IDDM & improved Ca++ level (try 20mg , alt w/ 10mg  Qod)...    HBP> on Norvasc & Diovan; BP= 132/80 & she denies CP, palpit, dizzy, ch in SOB/ DOE, edema, etc...    CHOL> on Lip20; FLP 4/12 looked good & we reviewed low fat diet restrictions...    DM on Insulin> as above, followed by DrA on Levemir30 & ApidraSS coverage at meals; BS at home varies 150-400+, FBS today=80, latest A1c was 7.6 in Aug...    Obesity> wt= 180# (63"Tall & BMI=32) which is up 8# and we reviewed diet, exercise, & wt reduction strategies...    GI> GERD on NexiumBid; and we reviewed antireflux regimen (elev HOB, npo after dinner, etc)...    Renal Insuffic> Creat had incr to 2.3 when Ca++ rose to 13.4, but has returned to 1.8 now; advised incr  free water intake...    Anxiety> on Alprazolam prn; she's been under extra stress w/ loss of job, this illness, family issues...  ~  June 28, 2011:  61mo ROV> on Pred 20-10 Qod for the last month and BMet today shows Ca++=9.8;  She is anxious to wean the Pred as quickly as poss due to incr appetite, weight gain, difficulty controlling her DM, etc;  She has been seeing DrAltheimer every 3-4wks w/ adjustments in her insulin> currently on Levemir 46u qd; Apidra 10+SS Tid at meals, Amaryl & Januvia;  BS's vary tremendously & she is struggling w/ diet (weight up 8# in the last month to  188# today);  We decided to cut the Pred to 10mg /d now and then further to 10-5 qod after 3wks w/ ROV in 6 weeks...  ~  August 16, 2011:  6wk ROV & post hosp check> she was Adm by [facility] 1/8 - 08/12/11 w/ dyspnea (DOE, orthopnea, edema) thought to be due to diastolic heart failure (2DEcho w/ Gr2DD, BNP=2200), she was diuresed & improved, disch on LASIX40mg - 1/2 tab each am & her prev Diovan was stopped (due to renal insuffic w/ Creat=2.4)...  Her serum Calcium levels were all norm 9.6-9.8;  A1c=9.3;  Hg=10.4>> See below...    Feeling better since disch;  Weight 196# is still 8# heavier than her last visit w/ me 11/12;  We decided to decr her Pred from 10-5 Qod to 5mg /d & keep Lasix at 20mg /d... LABS 1/13:  BUN=33, Creat=1.7, BNP=100;  Ca=9.7;  Hg=12.9;  BS=69 & she will f/u w/ DrAltheimer...  ~  October 06, 2011:  37mo ROV on Pred 5mg /d & generally stable but w/ mult minor somatic complaints- sl SOB, some cough, thick white mucous, right arm discomfort, & reflux symptoms;  She had a ZPak called in about 2wks ago;  She is on a good antireflux regimen, plus Flonase & Saline;  We will add Tussionex for prn use;  Unfortunately weight is up 9# further tp 205# today;  BP remains controlled on 5 MEDS (see below);  She continues to f/u w/ DrAltheimer for DM> on Levimir 46u & Apidra 03-15-13;  We decided to cut the Pred to 5mg  QOD & keep the Lasix 20mg /d... LABS 3/13:  BS=135,  BUN=38, Creat=1.6;  Ca=10.1;  ACE=48;  BNP=128...  ~  January 06, 2012:  75mo ROV & [name] has been on Pred5mg Qod for the last 75mo; she has noted some joint pains & has rash on her arms- seeing derm w/ hx Psoriasis but she feels their topical rx isn't working; she also notes sl cough, some greenish sput production, chest feels tight, & some right sided chest discomfort- she is requesting a ZPak which seems to work for her...     She saw DrAlt 4/13> Ave BS from her machine 249 & A1c=9.1, insulin adjusted; FLP looked good on Lip20; extensive note  reviewed...    [name], meds, xrays and labs> see below>> LABS 6/13:  [place]- BS=169 BUN=41 Creat=2.3 BNP=104 Ca=11.0 ACE=86;  CBC- ok w/ Hg=12.4 REC> Stop Lasix20 for now, increase Pred to 10mg /d, ROV 6wks w/ BMet...         Problem List:    OBSTRUCTIVE SLEEP APNEA - Sleep Study 11/07 showed RDI=21 w/ desat to 62% during REM.Marland Kitchen. eval by DrSood w/ Rx for CPAP 12... compliance poor- only using it 1-2 times/wk... encouraged to use CPAP more regularly & to f/u w/ DrSood regarding the mask interface problems... ~  8/11:  she reports new mask but still not using CPAP regularly, "I rest fairly well". ~  7/12:  She reports not using her CPAP, and not resting well... ~  1/13:  CPAP used briefly during [facility] for diastolic CHF, but still not compliant w/ home use...  BRONCHITIS, ACUTE - on ADVAIR 250Bid (using Prn only) & PROAIR as needed. 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 ~  CXR 1/09 was pre-op for TKR- sl cardomeg & ?mild vasc congestion. ~  CXR 2/11 showed norm heart size & vascularity, clear, s/p cholecystectomy, DJD in TSpine. ~  CXR & CT Chest 11/11 showed sl peribronch thickening, no lung lesions, no signif adenopathy. ~  PET scan 1/12 showed hypermetabolic activ in spleen & lymph nodes in the supraclav, hilar, mediastinal, periaotic & iliac region as well. ~  CXRs 3/12 in the perioperative period after splenectomy showed left effusion==> tapped & improved aeration. ~  CXR 4/12 back to baseline w/ clear bases, essent wnl... ~  CXR 7/12 showed norm heart size, clear lungs, no definite adenopathy, NAD... ~  LABS 7/12 w/ ACE=78 (8-52), Ca=12.0, AlkPhos=176 (39-117), GGT=151 (7-51) ~  Labs from DrAltheimer> 8/12: Ca=12.4, Creat=1.8; then 9/12: Ca=13.4, Creat=2.3;  ~  10/12:  PREDNISONE 20mg /d started 10/12 & 3wks later Ca=10.3, Creat=1.8; therefore weaned to 20-10 Qod. ~  11/12: Labs on Pred 20-10 Qod showed Ca++= 9.8; we will  continue to wean Pred. ~  1/13:  CXR showed cardiomeg, mild interstitial edema, DJD spine; Labs showed Ca=9.7 & we decided to wean Pred to 5mg /d... ~  3/13:  Labs showed Ca=10.1 on Pred 5mg /d; decided to wean to 5mg Qod... ~  6/13:  Labs showed Ca=11.0, ACE=86; on Pred5Qod & rec to incr back to 10mg /d...  HYPERTENSION - taking METOPROLOL 25mg - 1/2 daily, NORVASC 10mg /d, APRESOLINE 10mg Tid, CLONIDINE 0.1mg Bid, LASIX 40mg - 1/2 daily; off prev [id number] due to azotemia w/ diuresis. ~  4/12:  BP here 132/62 and she denies HA, visual changes, CP, palipit, dizziness, syncope, change in dyspnea, etc... ~  10/12:  BP= 132/80 & she denies CP, palpit, dizzy, ch in SOB/ DOE, edema, etc... ~  11/12:  BP= 132/82 & she is relatively asymptomatic other than DOE from her weight and some musc cramps... ~  1/13: Post Hosp BP= 146/70 on ? - Metop12.5, Amlod10, Clonid0.1Bid, Lasix20; she denies CP, palpit, SOB, edema> improved from recent hosp... ~  3/13: She called in the interim for "refill" Apresoline not prev on any med list; BP= 150/80 on all 5 meds; must elim sodium & get wt down... ~  6/13:  BP= 142/80 on but BUN-41 Creat=2.3 BNP=104; rec to stop Lasix for now, f/u BMet 6wks.  CAD - on ASA 81mg /d...  ~  Hx non-obstructive CAD w/ cath 6/07 by DrStuckey showing luminal irregularities & tiny first marginal branch of the CIRC w/ ostial lesion... good LVF. ~  2DEcho 9/07 showed mildly calcif AoV and normal LVF & wall motion. ~  2DEcho 11/11 showed norm LV wall thickness, norm LVF w/ EF= 60-65%, norm atria, norm valves, trivial peric fluid behind heart. ~  2DEcho 1/13 showed mild LVH, norm LVF w/ EF=55-60% & no regional wall motion abn, Gr2DD, norm RV...  CEREBROVASCULAR DISEASE - she remains on ASA 81mg /d without TIA's or other neuro manifestations...  ~  MRA Br 9/07 showed mod intracranial atherosclerotic changes...   HYPERLIPIDEMIA - on LIPITOR 20mg /d... FLP monitored at each OV by DrAltheimer... ~   FLP 7/07 showed TChol  114, TG 85, HDL 28, LDL 69 ~  FLP 4/09 showed TChol 154, TG 192, HDL 34, LDL 82... discussed poss of adding fibrate- hold... ~  FLP 2/10 on Lip20 showed TChol 159, TG 207, HDL 29, LDL 83... rec> add Fenoglide (she didn't). ~  FLP 2/11 on Lip20 showed TChol 185, TG 242, HDL 43, LDL 99... add FENOFIBRATE 160mg /d... ~  FLP 8/11 on Lip20+Feno160 showed TChol 167, TG 216, HDL 37, LDL 96... she stopped Feno160. ~  FLP 4/12 on Lip20 showed TChol 146, TG 115, HDL 35, LDL 88 ~  FLP 2/13 on Lip20 showed TChol 159, TG 176, HDL 41, LDL 86  DM - on LEVEMIR & APIDRA SS coverage now per DrAltheimer; plus GLIMEPIRIDE 4mg /d and JANUVIA 50mg /d. ~  labs 4/08 showed BS=138 & HgA1c=7.5.Marland Kitchen. home BS = 100 to >300... misses doses and not on diet or exercising... ~  1/09 became hypoglycemic in hosp on 4 meds post knee surg- meds adjusted... ~  3/09 restarted GLIMEPIRIDE 4mg - 1/2 tab each AM, & continue Lantus & Metformin... ~  labs 4/09 showed BS= 148, HgA1c= 7.3.Marland Kitchen. rec> back on 4 med regimen due to poor control at home. ~  6/09 discussed titrating the Lantus up until FBS 100-120 range... ~  labs 2/10 showed BS= 164, HgA1c= 8.6.Marland KitchenMarland Kitchen very disappointing- needs home BS monitoring, incr Lantus. ~  labs 2/11 showed BS= 298, A1c= 8.4...  discussed change Lantus to LEVEMIR 40 u daily... ~  labs 8/11 showed BS= 202, A1c= 10.9.Marland Kitchen. rec> incr Levemir to 50, consider Humalog. ~  Nov-Dec 2011:  BS up w/ adjust of meds & addition of Pred after hosp 11/11 & hypercalcemia... ~  She states she returned to Levemir 40u daily after the splenectomy hosp due to appetite etc;  4/12 BS=117 ~  Labs 7/12 showed BS= 167, A1c= 8.4> on Levemir 50u/d & Amaryl 4mg  Qam (note creat 1.9). ~  8-10/12: on Levemir30 + ApidraSS per DrA & BS are 150-400+ due to the Pred20; A1c 8/12 was 7.6 ~  11/12:  DrA has increased her Levemir to 46u/d & the Apidra to 10+SS cover Tid... BS here= 168 ~  1/13: Covered w/ SSI in Hosp but A1c=9.3 (Creat up  to 2.4 but improved to 1.7 w/ adjust meds)... ~  3/13: she continues to follow w/ DrAltheimer on Levimir & Apidra;  BS=135 today... ~  6/13:  She indicates DrA adjusted insulin> Levemir 60AM+20PM, Apidra 20-20-20 +SS amt...  OBESITY (ICD-278.00) - weight down to 171 after hosp 11/11- doing better on diet, not yet exercising. ~  weight 220-230# in the early 1990's... ~  weight 210-220# in the early 2000's... ~  weight 2/10 = 192# ~  weight 2/11 = 186# ~  weight 8/11 = 174# ~  weight 12/11 = 171# ~  Weight 4/12 = 168# ~  Weight 7/12 = 171# ~  Weight 10/12 = 180# ~  Weight 11/12 = 188# ~  Weight 1/13 = 196# ~  Weight 3/13 = 205#... She MUST get back on diet, incr exercise, get wt down. ~  Weight 6/13 = 205#  HYPERCALCEMIA> this was most likely due to sarcoidosis w/ extensive splenic involvement;  Calcium was as hight as 13.7 and returned to normal after the splenectomy. ~  Labs 4/12 showed calcium = 11.1 & this is very disappointing... ~  Labs 7/12 showed Ca= 12.0, Phos= 4.0, ACE= 78 ~  Labs 8-10/12 showed Ca up to 13.4, then down to 10.3 on PRED20mg /d... ~  Labs 11/12 showed Ca= 9.8 on Pred 20-10 qod; we will continue to wean. ~  Labs 1/13 in [place] showed Ca= 9.7 range on Pred 10-5 Qod;  We decided to wean Pred to 5mg /d... ~  Labs 3/13 showed Ca=10.1 on Pred5mg /d; rec to decr to 5mg Qod... ~  Labs 6/13 showed Ca=11.0 on Pred5Qod; rec incr to 10mg /d...  GERD - on NEXIUM 40mg  Bid... w/ increased reflux symptoms w/ noct cough etc...  ~  2/10: discussed optimal Rx w/ Nex before dinner, Zantac300 + Reglan10 at bed, elev HOB, etc. ~  2/11: she stopped the Reglan, still using Nexium/ Zantac but w/ persist symptoms>  refer to GI for eval. ~  6/11: GI f/u DrPerry w/ EGD that was normal... continue Rx. ~  12/11:  increased GI symptoms post hosp> she will f/u w/ DrPerry for his input> incr Nexium Bid.  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 ~  6/11:  f/u colonoscopy by DrPerry showed divertics, 4 polyps, AVM.Marland KitchenMarland Kitchen path= tubular adenoma, f/u 28yrs.  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 ~  Note: Serum Calcium ret to normal immediately after spleen removed...  RENAL INSUFFICIENCY (ICD-588.9) >> Creat ~ 2.0 & eval 10/11 by DrFox- prob due to DM & HBP... ~  Labs 4/12 showed BUN= 27, Creat= 1.6 ~  Labs 7/12 showed BUN= 44, Creat= 1.9 (not on diuretics/ NSAIDs/ etc)... ~  Labs showed Creat up to 2.3 when Ca=13.4; now improved to 1.8 w/ Ca coming down... ~  Labs 11/12 showed BUN=38, Creat=1.7; rec to maintain hydration... ~  Labs 1/13 in [place] w/ diuresis showed Creat incr to 2.4 but ret to 1.7 w/ adjustment in meds... ~  Labs 3/13 showed BUN=38, Creat= 1.6 ~  Labs 6/13 showed BUN=41, Creat= 2.3; rec> stop Lasix20 for now, liberalize fluids.  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 on LYRICA 100mg  Qhs, off prev Tramadol Rx... ~  2010 eval by DrHiatt @ [facility] on Tramadol 50mg , Lyrica 75mg Bid...  GOUT, UNSPECIFIED (ICD-274.9) - Uric in the 7-8 range...on ALLOPURINOL 300mg /d for prevention...  ANXIETY (ICD-300.00) - she is under mod stress- work, mother died, hospice counselling, etc... ~  8/11:  rec starting Alprazolam 0.5mg  Tid for palpit, SOB, anxiety...  ANEMIA-NOS & MGUS >> see eval 10-11/11 by DrGranfortuna w/ bone marrow 11/11 in hosp= neg. ~  Labs 4/12 showed Hg= 11.6, MCV= 84... On FeSO4 daily. ~  Labs 7/12 showed Hg= 12.8, MCV= 90, Fe= 45 (14%sat)... ~  Labs 1/13 in [place] showed Hg=10.4 but improved to 12.9 on post hosp check in office... ~  Labs 6/13 showed Hg= 12.4  PSORIASIS - eval and Rx per DrTafeen prev on Humira injections- stopped 9/11...   Past  Surgical History  Procedure Date  . Cholecystectomy   . Vesicovaginal fistula closure w/  total abdominal hysterectomy   . 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 Tonsillectomy   . Appendectomy   . Abdominal hysterectomy   . Breast reduction surgery 1982  . Open splenectomy [id number]    by DrRosenbower    Outpatient Encounter Prescriptions as of 01/06/2012  Medication Sig Dispense Refill  . ADVAIR DISKUS 250-50 MCG/DOSE AEPB inhale 1 dose by mouth twice a  day  60 each  5  . albuterol (PROAIR HFA) 108 (90 BASE) MCG/ACT inhaler Inhale 2 puffs into the lungs every 6 (six) hours as needed.        Marland Kitchen allopurinol (ZYLOPRIM) 300 MG tablet take 1 tablet by mouth once daily  30 tablet  PRN  . amLODipine (NORVASC) 10 MG tablet take 1 tablet by mouth once daily  30 tablet  PRN  . aspirin 81 MG tablet Take 81 mg by mouth daily.        Marland Kitchen atorvastatin (LIPITOR) 20 MG tablet take 1 tablet by mouth once daily  30 tablet  11  . chlorpheniramine-HYDROcodone (TUSSIONEX PENNKINETIC ER) 10-8 MG/5ML LQCR 1 tsp by mouth every 12 hours as needed for cough  120 mL  5  . cholecalciferol (VITAMIN D) 1000 UNITS tablet Take 1,000 Units by mouth daily.        . cloNIDine (CATAPRES) 0.1 MG tablet take 1 tablet by mouth twice a day  60 tablet  5  . Diphenhyd-Hydrocort-Nystatin (FIRST-DUKES MOUTHWASH) SUSP 1 tsp gargle and swallow four times daily as needed  120 mL  0  . esomeprazole (NEXIUM) 40 MG capsule Take 1 capsule (40 mg total) by mouth 2 (two) times daily before a meal.  60 capsule  11  . ferrous sulfate 325 (65 FE) MG tablet take 1 tablet by mouth once daily with meals  30 tablet  6  . fluticasone (FLONASE) 50 MCG/ACT nasal spray Place 2 sprays into the nose at bedtime.  48 g  3  . furosemide (LASIX) 40 MG tablet Take 0.5 tablets (20 mg total) by mouth daily.  90 tablet  3  . glimepiride (AMARYL) 4 MG tablet Take 1 tablet (4 mg total) by mouth daily before breakfast.  30 tablet  6  .  guaiFENesin (MUCINEX) 600 MG 12 hr tablet Take 1,200 mg by mouth 2 (two) times daily.      . hydrALAZINE (APRESOLINE) 10 MG tablet Take 1 tablet (10 mg total) by mouth 3 (three) times daily.  90 tablet  5  . insulin detemir (LEVEMIR FLEXPEN) 100 UNIT/ML injection       . insulin glulisine (APIDRA SOLOSTAR) 100 UNIT/ML injection Inject 20 Units into the skin 3 (three) times daily before meals. Use sliding scale as directed      . ketorolac (ACULAR PF) 0.5 % ophthalmic solution Place 1 drop into both eyes 3 (three) times daily.        Marland Kitchen LORazepam (ATIVAN) 1 MG tablet Take 1/2 to 1 tablet by mouth three times daily as needed for nerves  90 tablet  5  . LYRICA 100 MG capsule take 1 capsule by mouth at bedtime  30 capsule  5  . metoprolol tartrate (LOPRESSOR) 25 MG tablet take 1/2 tablet by mouth once daily  30 tablet  2  . polyethylene glycol (MIRALAX / GLYCOLAX) packet Take 17 g by mouth daily.        . predniSONE (DELTASONE) 5 MG tablet Take 1/2 tablet by mouth every other day      . sitaGLIPtin (JANUVIA) 50 MG tablet Take 1 tablet (50 mg total) by mouth daily.  30 tablet  11  . DISCONTD: LEVEMIR FLEXPEN 100 UNIT/ML injection inject 40 units subcutaneously once daily  15 mL  6  . DISCONTD: atorvastatin (LIPITOR) 20 MG tablet take 1 tablet by mouth once daily  30 tablet  PRN  . DISCONTD: cloNIDine (CATAPRES) 0.1 MG tablet take 1 tablet  by mouth twice a day  60 tablet  5  . DISCONTD: glimepiride (AMARYL) 4 MG tablet take 1 tablet by mouth once daily  30 tablet  PRN  . DISCONTD: predniSONE (DELTASONE) 10 MG tablet Take 1/2 tablet by mouth every other day        Allergies  Allergen Reactions  . Avelox (Moxifloxacin Hcl In Nacl)     GI upset  . Codeine Other (See Comments)    "crazy"   . Guaifenesin     REACTION: pt states Nausea \\T \ Vomiting  . Latex     REACTION: SWELLING  . Oxycodone     REACTION: N/V    Current Medications, Allergies, Past Medical History, Past Surgical History, Family  History, and Social History were reviewed in [employer] record.   Review of Systems         See HPI - all other systems neg except as noted... The patient complains of dyspnea on exertion.  The patient denies anorexia, fever, weight loss, weight gain, vision loss, decreased hearing, hoarseness, chest pain, syncope, peripheral edema, prolonged cough, headaches, hemoptysis, abdominal pain, melena, hematochezia, severe indigestion/heartburn, hematuria, incontinence, muscle weakness, suspicious skin lesions, transient blindness, difficulty walking, depression, unusual weight change, abnormal bleeding, enlarged lymph nodes, and angioedema.     Objective:   Physical Exam      WD, Overweight, 65 y/o BF in NAD...  GENERAL:  Alert & oriented; pleasant & cooperative... HEENT:  [place]/AT, EOM-wnl, PERRLA, EACs-clear, TMs-wnl, NOSE-clear, THROAT- clear & wnl... NECK:  Supple w/ fair ROM; no JVD; normal carotid impulses w/o bruits; palp thyroid, w/o nodules felt; no lymphadenopathy. CHEST:  Clear to P & A; without wheezes/ rales/ or rhonchi. HEART:  Regular Rhythm; gr 1/6 SEM, without rubs/ or gallops. ABDOMEN:  Scar from splenectomy surg; obese, soft, & nontender w/ panniculus; normal bowel sounds; no organomegaly or masses detected EXT: without deformities, mod arthritic changes, s/p bilat TKRs; no varicose veins/ +venous insuffic/ tr edema. NEURO:  CN's intact; no focal neuro deficits... DERM:  Psoriasis rash per DrTafeen...  RADIOLOGY DATA:  Reviewed in the EPIC EMR & discussed w/ the patient...  LABORATORY DATA:  Reviewed in the EPIC EMR & discussed w/ the patient...   Assessment & Plan:   HBP>  BP 142/80 on 5 meds but we are going to stop the LASIX20 for now due to incr creat at 2.3.Marland KitchenMarland Kitchen  DIASTOLIC CHF>  2DEcho w/ Gr 2 DD & she was diuresed, prev Lasix backed off to 20mg /d & now placed on hold w/ Creat=2.3 & BNP=104.  SARCOIDOSIS>  Main manifestation has been her  hypercalcemia & now back to 11.0 w/ ACE up to 86; rec incr Pred to 10mg /d...  Hypercalcemia>  Initially responded to splenectomy but her calcium level rose again over several months after the surg; we were forced to use Pred rx in this difficult diabetic; we had her see DrAltheimer for DM/ endocrine consult; calcium levels have responded nicely to Pred Rx but climbed back to 11.0 when Pred weaned to 5mg  Qod; therefore rec incr Pred back to 10mg /d...  DM>  Control has been difficult;  Stressed diet/ exercise, & get wt down; DrA has her on Levemir & ApidraSS coverage...  Renal Insuffic>  Creat was up to 2.4 in [place] & then back to 1.6 but now back to 2.3 w/ Lasix at 20mg /d & BNP=104; rec HOLD LASIX for now...  Other medical problems as noted...   Patient's Medications  New Prescriptions  AZITHROMYCIN (ZITHROMAX) 250 MG TABLET    Take as directed   INSULIN PEN NEEDLE 32G X 4 MM MISC    Use as directed  Previous Medications   ADVAIR DISKUS 250-50 MCG/DOSE AEPB    inhale 1 dose by mouth twice a day   ALBUTEROL (PROAIR HFA) 108 (90 BASE) MCG/ACT INHALER    Inhale 2 puffs into the lungs every 6 (six) hours as needed.     ALLOPURINOL (ZYLOPRIM) 300 MG TABLET    take 1 tablet by mouth once daily   AMLODIPINE (NORVASC) 10 MG TABLET    take 1 tablet by mouth once daily   ASPIRIN 81 MG TABLET    Take 81 mg by mouth daily.     ATORVASTATIN (LIPITOR) 20 MG TABLET    take 1 tablet by mouth once daily   CHLORPHENIRAMINE-HYDROCODONE (TUSSIONEX PENNKINETIC ER) 10-8 MG/5ML LQCR    1 tsp by mouth every 12 hours as needed for cough   CHOLECALCIFEROL (VITAMIN D) 1000 UNITS TABLET    Take 1,000 Units by mouth daily.     CLONIDINE (CATAPRES) 0.1 MG TABLET    take 1 tablet by mouth twice a day   DIPHENHYD-HYDROCORT-NYSTATIN (FIRST-DUKES MOUTHWASH) SUSP    1 tsp gargle and swallow four times daily as needed   ESOMEPRAZOLE (NEXIUM) 40 MG CAPSULE    Take 1 capsule (40 mg total) by mouth 2 (two) times daily before a  meal.   FERROUS SULFATE 325 (65 FE) MG TABLET    take 1 tablet by mouth once daily with meals   FLUTICASONE (FLONASE) 50 MCG/ACT NASAL SPRAY    Place 2 sprays into the nose at bedtime.   GLIMEPIRIDE (AMARYL) 4 MG TABLET    Take 1 tablet (4 mg total) by mouth daily before breakfast.   GUAIFENESIN (MUCINEX) 600 MG 12 HR TABLET    Take 1,200 mg by mouth 2 (two) times daily.   HYDRALAZINE (APRESOLINE) 10 MG TABLET    Take 1 tablet (10 mg total) by mouth 3 (three) times daily.   INSULIN GLULISINE (APIDRA SOLOSTAR) 100 UNIT/ML INJECTION    Inject 20 Units into the skin 3 (three) times daily before meals. Use sliding scale as directed   KETOROLAC (ACULAR PF) 0.5 % OPHTHALMIC SOLUTION    Place 1 drop into both eyes 3 (three) times daily.     LORAZEPAM (ATIVAN) 1 MG TABLET    Take 1/2 to 1 tablet by mouth three times daily as needed for nerves   LYRICA 100 MG CAPSULE    take 1 capsule by mouth at bedtime   METOPROLOL TARTRATE (LOPRESSOR) 25 MG TABLET    take 1/2 tablet by mouth once daily   POLYETHYLENE GLYCOL (MIRALAX / GLYCOLAX) PACKET    Take 17 g by mouth daily.     PREDNISONE (DELTASONE) 5 MG TABLET    Take 2 tablets by mouth daily   SITAGLIPTIN (JANUVIA) 50 MG TABLET    Take 1 tablet (50 mg total) by mouth daily.  Modified Medications   Modified Medication Previous Medication   INSULIN DETEMIR (LEVEMIR FLEXPEN) 100 UNIT/ML INJECTION LEVEMIR FLEXPEN 100 UNIT/ML injection          As directed by drAltheimer...  Discontinued Medications

## 2012-01-07 LAB — ANGIOTENSIN CONVERTING ENZYME: Angiotensin-Converting Enzyme: 86 U/L — ABNORMAL HIGH (ref 8–52)

## 2012-01-10 ENCOUNTER — Other Ambulatory Visit: Payer: Self-pay | Admitting: *Deleted

## 2012-01-10 MED ORDER — PREDNISONE 5 MG PO TABS: ORAL_TABLET | ORAL | Status: DC

## 2012-02-02 ENCOUNTER — Encounter: Payer: Self-pay | Admitting: Cardiology

## 2012-02-02 ENCOUNTER — Ambulatory Visit (INDEPENDENT_AMBULATORY_CARE_PROVIDER_SITE_OTHER): Payer: BC Managed Care – PPO | Admitting: Cardiology

## 2012-02-02 VITALS — BP 128/76 | HR 66 | Ht 62.5 in | Wt 208.0 lb

## 2012-02-02 DIAGNOSIS — E669 Obesity, unspecified: Secondary | ICD-10-CM

## 2012-02-02 DIAGNOSIS — N259 Disorder resulting from impaired renal tubular function, unspecified: Secondary | ICD-10-CM

## 2012-02-02 DIAGNOSIS — I679 Cerebrovascular disease, unspecified: Secondary | ICD-10-CM

## 2012-02-02 DIAGNOSIS — I2510 Atherosclerotic heart disease of native coronary artery without angina pectoris: Secondary | ICD-10-CM

## 2012-02-02 DIAGNOSIS — I5032 Chronic diastolic (congestive) heart failure: Secondary | ICD-10-CM

## 2012-02-02 DIAGNOSIS — I10 Essential (primary) hypertension: Secondary | ICD-10-CM

## 2012-02-02 DIAGNOSIS — E7800 Pure hypercholesterolemia, unspecified: Secondary | ICD-10-CM

## 2012-02-02 DIAGNOSIS — G4730 Sleep apnea, unspecified: Secondary | ICD-10-CM

## 2012-02-02 DIAGNOSIS — E785 Hyperlipidemia, unspecified: Secondary | ICD-10-CM

## 2012-02-02 NOTE — Assessment & Plan Note (Addendum)
She has some pedal edema from going off of her diuretic. Her lungs are clear there is no sign of decompensated heart failure. No change in treatment. Continue good blood pressure control.

## 2012-02-02 NOTE — Patient Instructions (Addendum)
Your physician recommends that you continue on your current medications as directed. Please refer to the Current Medication list given to you today.  Ifyou begin to run a fever or your cough worsens please call your  Primary care Dr. Kriste Basque  Your physician wants you to follow-up in: 1 year. You will receive a reminder letter in the mail two months in advance. If you don't receive a letter, please call our office to schedule the follow-up appointment.

## 2012-02-02 NOTE — Progress Notes (Signed)
HPI [name] comes in today for followup of her coronary artery disease and other risk factors. She is doing well without any chest pain or angina. She does have some dyspnea on exertion. He has a lot of problems with her upper airways and has a history of sarcoidosis followed by Dr. Kriste Basque.  He developed a dry cough last night. She denies any fever chills or sputum production. She denies orthopnea, PND. Her calcium was up so she had to go off her diuretic. For fever and swollen and a day. Blood pressures been under good control however.  Echocardiogram earlier this year did not show any valvular abnormality. She is benign murmur. He has good LV function and no evidence of pulmonary hypertension.   Current Outpatient Prescriptions  Medication Sig Dispense Refill  . ADVAIR DISKUS 250-50 MCG/DOSE AEPB inhale 1 dose by mouth twice a day  60 each  5  . albuterol (PROAIR HFA) 108 (90 BASE) MCG/ACT inhaler Inhale 2 puffs into the lungs every 6 (six) hours as needed.        Marland Kitchen allopurinol (ZYLOPRIM) 300 MG tablet take 1 tablet by mouth once daily  30 tablet  PRN  . amLODipine (NORVASC) 10 MG tablet take 1 tablet by mouth once daily  30 tablet  PRN  . aspirin 81 MG tablet Take 81 mg by mouth daily.        Marland Kitchen atorvastatin (LIPITOR) 20 MG tablet take 1 tablet by mouth once daily  30 tablet  11  . chlorpheniramine-HYDROcodone (TUSSIONEX PENNKINETIC ER) 10-8 MG/5ML LQCR 1 tsp by mouth every 12 hours as needed for cough  120 mL  5  . cholecalciferol (VITAMIN D) 1000 UNITS tablet Take 1,000 Units by mouth daily.        . cloNIDine (CATAPRES) 0.1 MG tablet take 1 tablet by mouth twice a day  60 tablet  5  . Diphenhyd-Hydrocort-Nystatin (FIRST-DUKES MOUTHWASH) SUSP 1 tsp gargle and swallow four times daily as needed  120 mL  0  . esomeprazole (NEXIUM) 40 MG capsule Take 1 capsule (40 mg total) by mouth 2 (two) times daily before a meal.  60 capsule  11  . ferrous sulfate 325 (65 FE) MG tablet take 1 tablet by  mouth once daily with meals  30 tablet  6  . fluocinonide cream (LIDEX) 0.05 % as directed.      . fluticasone (FLONASE) 50 MCG/ACT nasal spray Place 2 sprays into the nose at bedtime.  48 g  3  . glimepiride (AMARYL) 4 MG tablet Take 1 tablet (4 mg total) by mouth daily before breakfast.  30 tablet  6  . guaiFENesin (MUCINEX) 600 MG 12 hr tablet Take 1,200 mg by mouth 2 (two) times daily.      . hydrALAZINE (APRESOLINE) 10 MG tablet Take 1 tablet (10 mg total) by mouth 3 (three) times daily.  90 tablet  5  . insulin detemir (LEVEMIR FLEXPEN) 100 UNIT/ML injection       . insulin glulisine (APIDRA SOLOSTAR) 100 UNIT/ML injection Inject 20 Units into the skin 3 (three) times daily before meals. Use sliding scale as directed      . Insulin Pen Needle 32G X 4 MM MISC Use as directed  50 each  6  . JANUVIA 50 MG tablet Take 1 tablet by mouth daily.      Marland Kitchen ketorolac (ACULAR PF) 0.5 % ophthalmic solution Place 1 drop into both eyes 3 (three) times daily.        Marland Kitchen  LORazepam (ATIVAN) 1 MG tablet Take 1/2 to 1 tablet by mouth three times daily as needed for nerves  90 tablet  5  . LYRICA 100 MG capsule take 1 capsule by mouth at bedtime  30 capsule  5  . metoprolol tartrate (LOPRESSOR) 25 MG tablet take 1/2 tablet by mouth once daily  30 tablet  2  . polyethylene glycol (MIRALAX / GLYCOLAX) packet Take 17 g by mouth daily.        . predniSONE (DELTASONE) 5 MG tablet Take 2 tablets by mouth every morning  60 tablet  6    Allergies  Allergen Reactions  . Adhesive (Tape)     Blisters   . Avelox (Moxifloxacin Hcl In Nacl)     GI upset  . Codeine Other (See Comments)    "crazy"   . Guaifenesin     REACTION: pt states Nausea \\T \ Vomiting  . Latex     REACTION: SWELLING  . Oxycodone     REACTION: N/V    Family History  Problem Relation Age of Onset  . Breast cancer    . Diabetes Mother     History   Social History  . Marital Status: Single    Spouse Name: N/A    Number of Children: 0    . Years of Education: N/A   Occupational History  . Retired   . SECRETARY    Social History Main Topics  . Smoking status: Never Smoker   . Smokeless tobacco: Never Used  . Alcohol Use: No  . Drug Use: No  . Sexually Active: Not on file   Other Topics Concern  . Not on file   Social History Narrative  . No narrative on file    ROS ALL NEGATIVE EXCEPT THOSE NOTED IN HPI  PE  General Appearance: well developed, well nourished in no acute distress, obese HEENT: symmetrical face, PERRLA, good dentition  Neck: no JVD, thyromegaly, or adenopathy, trachea midline Chest: symmetric without deformity Cardiac: PMI non-displaced, RRR, normal S1, S2, no gallop or murmur Lung: clear to ausculation and percussion Vascular: all pulses full without bruits  Abdominal: nondistended, nontender, good bowel sounds, no HSM, no bruits Extremities: no cyanosis, clubbing , 1+ pedal edema,  no sign of DVT, no varicosities  Skin: normal color, no rashes Neuro: alert and oriented x 3, non-focal Pysch: normal affect  EKG  normal sinus rhythm, low-voltage, no acute changes.  BMET    Component Value Date/Time   NA 140 01/06/2012 1108   K 4.0 01/06/2012 1108   CL 105 01/06/2012 1108   CO2 27 01/06/2012 1108   GLUCOSE 169* 01/06/2012 1108   BUN 41* 01/06/2012 1108   CREATININE 2.3* 01/06/2012 1108   CALCIUM 11.0* 01/06/2012 1108   CALCIUM 11.8* 06/21/2010 0402   GFRNONAA 21* 08/12/2011 0320   GFRAA 24* 08/12/2011 0320    Lipid Panel     Component Value Date/Time   CHOL 146 11/24/2010 1339   TRIG 115.0 11/24/2010 1339   HDL 35.30* 11/24/2010 1339   CHOLHDL 4 11/24/2010 1339   VLDL 23.0 11/24/2010 1339   LDLCALC 88 11/24/2010 1339    CBC    Component Value Date/Time   WBC 11.9* 01/06/2012 1108   WBC 4.6 07/17/2010 1551   RBC 4.21 01/06/2012 1108   RBC 3.71 07/17/2010 1551   HGB 12.4 01/06/2012 1108   HGB 10.5* 07/17/2010 1551   HCT 39.2 01/06/2012 1108   HCT 31.5* 07/17/2010 1551   PLT  418.0* 01/06/2012  1108   PLT 230 07/17/2010 1551   MCV 93.1 01/06/2012 1108   MCV 84.9 07/17/2010 1551   MCH 28.9 08/12/2011 0320   MCH 28.3 07/17/2010 1551   MCHC 31.8 01/06/2012 1108   MCHC 33.3 07/17/2010 1551   RDW 16.6* 01/06/2012 1108   RDW 14.8* 07/17/2010 1551   LYMPHSABS 2.0 01/06/2012 1108   LYMPHSABS 0.8* 07/17/2010 1551   MONOABS 1.4* 01/06/2012 1108   MONOABS 0.5 07/17/2010 1551   EOSABS 0.4 01/06/2012 1108   EOSABS 0.2 07/17/2010 1551   BASOSABS 0.1 01/06/2012 1108   BASOSABS 0.0 07/17/2010 1551

## 2012-02-04 ENCOUNTER — Telehealth: Payer: Self-pay | Admitting: Pulmonary Disease

## 2012-02-04 MED ORDER — AZITHROMYCIN 250 MG PO TABS: ORAL_TABLET | ORAL | Status: AC

## 2012-02-04 MED ORDER — MAGIC MOUTHWASH: 10.0000 mL | Freq: Four times a day (QID) | ORAL | Status: DC | PRN

## 2012-02-04 NOTE — Telephone Encounter (Signed)
Spoke with pt and notified of recs per MW. She verbalized understanding. Rxs were sent to pharm.

## 2012-02-04 NOTE — Telephone Encounter (Signed)
2 tsp qid prn # 8 oz

## 2012-02-04 NOTE — Telephone Encounter (Signed)
Called, spoke with pt who c/o hacky cough "down in throat" which is prod with white mucus, sore throat, increased SOB, wheezing, some chest tightness, and sweats at times.  Sxs started on Wednesday.  Has started mucinex and taking tussionex with some relief.  Requesting a z pak and [facility] MMW.  As Dr. Kriste Basque is off today, will forward to doc of the day -- Dr. Sherene Sires, pls advise.  Thank you.  [facility]  Allergies verified:  Allergies  Allergen Reactions  . Adhesive (Tape)     Blisters   . Avelox (Moxifloxacin Hcl In Nacl)     GI upset  . Codeine Other (See Comments)    "crazy"   . Guaifenesin     REACTION: pt states Nausea \\T \ Vomiting  . Latex     REACTION: SWELLING  . Oxycodone     REACTION: N/V

## 2012-02-04 NOTE — Telephone Encounter (Signed)
Ok but if gets worse over weekend will need to be seen in urgent care or ER

## 2012-02-04 NOTE — Telephone Encounter (Signed)
Dr. Sherene Sires, pls advise on sig for MMW and how much you would like to give.  Thank you.

## 2012-02-12 ENCOUNTER — Emergency Department (INDEPENDENT_AMBULATORY_CARE_PROVIDER_SITE_OTHER)
Admission: EM | Admit: 2012-02-12 | Discharge: 2012-02-12 | Disposition: A | Discharge status: Home / Self Care | Payer: BC Managed Care – PPO | Source: Home / Self Care | Attending: Emergency Medicine | Admitting: Emergency Medicine

## 2012-02-12 ENCOUNTER — Emergency Department (INDEPENDENT_AMBULATORY_CARE_PROVIDER_SITE_OTHER): Payer: BC Managed Care – PPO

## 2012-02-12 ENCOUNTER — Encounter (HOSPITAL_COMMUNITY): Payer: Self-pay | Admitting: Emergency Medicine

## 2012-02-12 DIAGNOSIS — R05 Cough: Secondary | ICD-10-CM

## 2012-02-12 DIAGNOSIS — J441 Chronic obstructive pulmonary disease with (acute) exacerbation: Secondary | ICD-10-CM

## 2012-02-12 HISTORY — DX: Chronic obstructive pulmonary disease, unspecified: J44.9

## 2012-02-12 HISTORY — DX: Pneumonia, unspecified organism: J18.9

## 2012-02-12 MED ORDER — ALBUTEROL SULFATE HFA 108 (90 BASE) MCG/ACT IN AERS: 1.0000 | INHALATION_SPRAY | Freq: Four times a day (QID) | RESPIRATORY_TRACT | Status: DC | PRN

## 2012-02-12 MED ORDER — ALBUTEROL SULFATE (5 MG/ML) 0.5% IN NEBU
5.0000 mg | INHALATION_SOLUTION | Freq: Once | RESPIRATORY_TRACT | Status: AC
  Administered 2012-02-12: 5 mg via RESPIRATORY_TRACT

## 2012-02-12 MED ORDER — DEXAMETHASONE 4 MG PO TABS
16.0000 mg | ORAL_TABLET | Freq: Once | ORAL | Status: AC
  Administered 2012-02-12: 16 mg via ORAL

## 2012-02-12 MED ORDER — LEVOFLOXACIN 750 MG PO TABS: 750.0000 mg | ORAL_TABLET | Freq: Every day | ORAL | Status: AC

## 2012-02-12 MED ORDER — IPRATROPIUM BROMIDE 0.02 % IN SOLN
0.5000 mg | Freq: Once | RESPIRATORY_TRACT | Status: AC
  Administered 2012-02-12: 0.5 mg via RESPIRATORY_TRACT

## 2012-02-12 MED ORDER — DEXAMETHASONE 4 MG PO TABS
ORAL_TABLET | ORAL | Status: AC
  Filled 2012-02-12: qty 3

## 2012-02-12 MED ORDER — DEXAMETHASONE 4 MG PO TABS: ORAL_TABLET | ORAL | Status: DC

## 2012-02-12 MED ORDER — DEXAMETHASONE 4 MG PO TABS: ORAL_TABLET | ORAL | Status: AC

## 2012-02-12 MED ORDER — ALBUTEROL SULFATE (5 MG/ML) 0.5% IN NEBU
INHALATION_SOLUTION | RESPIRATORY_TRACT | Status: AC
  Filled 2012-02-12: qty 1

## 2012-02-12 MED ORDER — DEXAMETHASONE 2 MG PO TABS
ORAL_TABLET | ORAL | Status: AC
  Filled 2012-02-12: qty 7

## 2012-02-12 NOTE — ED Notes (Signed)
1 1/2 weeks of uri symptoms.  Reports being on zpack 2 weeks ago for green phlegm.  Seemed to improve.  Then  cough worsened.  Was treated with second zpack after 7/4.  Cough is persistent.

## 2012-02-12 NOTE — ED Provider Notes (Signed)
History     CSN: [id number]  Arrival date & time 02/12/12  1250   First MD Initiated Contact with Patient 02/12/12 1301      Chief Complaint  Patient presents with  . Shortness of Breath    (Consider location/radiation/quality/duration/timing/severity/associated sxs/prior treatment) HPI Comments: Patient with a history of COPD/sarcoidosis, pneumonia reports increasing coughing, wheezing, chest tightness, shortness of breath for the past several days. Reports chest soreness from coughing. No chest pain. She states that she is unable to sleep at night secondary to all the coughing. She has had difficulty with this over the past 3 weeks, and has been on 2 courses of azithromycin. She states that she seemed to improve after the second round of azithromycin, which she finished on July 9, but states that the cough has returned. She is now coughing up whitish phlegm. States that she is using her albuterol 2 puffs twice a day instructed, and is compliant with the Advair. She is on 10 mg of prednisone daily on a chronic basis for the sarcoidosis. She is not on home O2. Denies any nausea, vomiting, fevers. No abdominal swelling, unintentional weight gain, lower extremity swelling. She denies history of CHF, DVT or PE. States her glucose has been running within normal limits for her.  ROS as noted in HPI. All other ROS negative.   Patient is a 66 y.o. female presenting with shortness of breath. The history is provided by the patient. No language interpreter was used.  Shortness of Breath  The current episode started more than 2 weeks ago. The onset was gradual. The problem has been gradually worsening. The symptoms are relieved by beta-agonist inhalers and one or more prescription drugs. Nothing aggravates the symptoms. Associated symptoms include cough, shortness of breath and wheezing. Pertinent negatives include no chest pain, no chest pressure, no orthopnea, no fever, no rhinorrhea, no sore throat and  no stridor. She has not inhaled smoke recently. She is currently using steroids. She has had prior hospitalizations. She has had no prior ICU admissions. She has had no prior intubations. Her past medical history is significant for asthma.    Past Medical History  Diagnosis Date  . Unspecified essential hypertension   . CAD (coronary artery disease)   . Gastritis   . Asthma   . OSA (obstructive sleep apnea)   . Esophageal reflux   . Obesity, unspecified   . Degenerative joint disease   . Anxiety state, unspecified   . Hypercalcemia   . Anemia   . Other psoriasis   . Diverticulosis   . Sarcoidosis   . Diabetes mellitus   . Kidney disease   . Dyslipidemia   . PNA (pneumonia)     With pleural effusion, requiring thoracentesis  . Pleural effusion   . COPD (chronic obstructive pulmonary disease)     Past Surgical History  Procedure Date  . Cholecystectomy   . Vesicovaginal fistula closure w/  total abdominal hysterectomy   . 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 Tonsillectomy   . Appendectomy   . Abdominal hysterectomy   . Breast reduction surgery 1982  . 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 Breast cancer    . Diabetes Mother     History  Substance Use Topics  . Smoking status: Never Smoker   . Smokeless tobacco: Never Used  . Alcohol Use: No  OB History    Grav Para Term Preterm Abortions TAB SAB Ect Mult Living                  Review of Systems  Constitutional: Negative for fever.  HENT: Negative for sore throat and rhinorrhea.   Respiratory: Positive for cough, shortness of breath and wheezing. Negative for stridor.   Cardiovascular: Negative for chest pain and orthopnea.    Allergies  Adhesive; Avelox; Codeine; Guaifenesin; Latex; and Oxycodone  Home Medications   Current Outpatient Rx  Name Route Sig Dispense Refill  . ADVAIR DISKUS 250-50 MCG/DOSE  IN AEPB  inhale 1 dose by mouth twice a day 60 each 5  . ALBUTEROL SULFATE HFA 108 (90 BASE) MCG/ACT IN AERS Inhalation Inhale 1-2 puffs into the lungs every 6 (six) hours as needed for wheezing. 1 Inhaler 0  . ALLOPURINOL 300 MG PO TABS  take 1 tablet by mouth once daily 30 tablet PRN    Refill Approved  . MAGIC MOUTHWASH Oral Take 10 mLs by mouth 4 (four) times daily as needed. 240 mL 0  . AMLODIPINE BESYLATE 10 MG PO TABS  take 1 tablet by mouth once daily 30 tablet PRN    Refill Approved  . ASPIRIN 81 MG PO TABS Oral Take 81 mg by mouth daily.      . ATORVASTATIN CALCIUM 20 MG PO TABS  take 1 tablet by mouth once daily 30 tablet 11  . HYDROCOD POLST-CPM POLST ER 10-8 MG/5ML PO LQCR  1 tsp by mouth every 12 hours as needed for cough 120 mL 5  . VITAMIN D 1000 UNITS PO TABS Oral Take 1,000 Units by mouth daily.      Marland Kitchen CLONIDINE HCL 0.1 MG PO TABS  take 1 tablet by mouth twice a day 60 tablet 5    Refill Approved with Changes  . DEXAMETHASONE 4 MG PO TABS  4 tabs (16 mg) po at once 4 tablet 0  . FIRST-DUKES MOUTHWASH MT SUSP  1 tsp gargle and swallow four times daily as needed 120 mL 0  . ESOMEPRAZOLE MAGNESIUM 40 MG PO CPDR Oral Take 1 capsule (40 mg total) by mouth 2 (two) times daily before a meal. 60 capsule 11  . FERROUS SULFATE 325 (65 FE) MG PO TABS  take 1 tablet by mouth once daily with meals 30 tablet 6  . FLUOCINONIDE 0.05 % EX CREA  as directed.    Marland Kitchen FLUTICASONE PROPIONATE 50 MCG/ACT NA SUSP Nasal Place 2 sprays into the nose at bedtime. 48 g 3  . GLIMEPIRIDE 4 MG PO TABS Oral Take 1 tablet (4 mg total) by mouth daily before breakfast. 30 tablet 6  . GUAIFENESIN ER 600 MG PO TB12 Oral Take 1,200 mg by mouth 2 (two) times daily.    Marland Kitchen HYDRALAZINE HCL 10 MG PO TABS Oral Take 1 tablet (10 mg total) by mouth 3 (three) times daily. 90 tablet 5  . INSULIN DETEMIR 100 UNIT/ML [employer] SOLN      . INSULIN GLULISINE 100 UNIT/ML  SOLN Subcutaneous Inject 20 Units into the skin 3 (three) times  daily before meals. Use sliding scale as directed    . INSULIN PEN NEEDLE 32G X 4 MM MISC  Use as directed 50 each 6  . JANUVIA 50 MG PO TABS Oral Take 1 tablet by mouth daily.    Marland Kitchen KETOROLAC TROMETHAMINE 0.5 % OP SOLN Both Eyes Place 1 drop into both eyes 3 (three) times  daily.      Marland Kitchen LEVOFLOXACIN 750 MG PO TABS Oral Take 1 tablet (750 mg total) by mouth daily. X 5 days 5 tablet 0  . LORAZEPAM 1 MG PO TABS  Take 1/2 to 1 tablet by mouth three times daily as needed for nerves 90 tablet 5  . LYRICA 100 MG PO CAPS  take 1 capsule by mouth at bedtime 30 capsule 5  . METOPROLOL TARTRATE 25 MG PO TABS  take 1/2 tablet by mouth once daily 30 tablet 2  . POLYETHYLENE GLYCOL 3350 PO PACK Oral Take 17 g by mouth daily.      Marland Kitchen PREDNISONE 5 MG PO TABS  Take 2 tablets by mouth every morning 60 tablet 6    BP 188/89  Pulse 83  Temp 98.5 F (36.9 C) (Oral)  Resp 23  SpO2 98%  Physical Exam  Nursing note and vitals reviewed. Constitutional: She is oriented to person, place, and time. She appears well-developed and well-nourished.  HENT:  Head: Normocephalic and atraumatic.  Eyes: Conjunctivae and EOM are normal.  Neck: Normal range of motion.  Cardiovascular: Normal rate, regular rhythm, normal heart sounds and intact distal pulses.   No murmur heard. Pulmonary/Chest: Effort normal. No respiratory distress. She has wheezes. She has rales. She exhibits tenderness.  Abdominal: Soft. Bowel sounds are normal. She exhibits no distension. There is no tenderness. There is no rebound and no guarding.  Musculoskeletal: Normal range of motion. She exhibits no edema and no tenderness.  Neurological: She is alert and oriented to person, place, and time.  Skin: Skin is warm and dry.  Psychiatric: She has a normal mood and affect. Her behavior is normal. Judgment and thought content normal.    ED Course  Procedures (including critical care time)  Labs Reviewed - No data to display Dg Chest 2  View  02/12/2012  *RADIOLOGY REPORT*  Clinical Data: Cough for 7 days.  Sarcoidosis.  Shortness of breath and weakness.  CHEST - 2 VIEW  Comparison: 08/10/2011  Findings: Lateral view degraded by patient arm position.  Moderate lower thoracic spondylosis.  Cholecystectomy.  Low lung volumes on the frontal.  Mild cardiomegaly. No pleural effusion or pneumothorax.  Mild right hemidiaphragm elevation.  Resolved interstitial edema.  Mild bibasilar volume loss.  IMPRESSION: Cardiomegaly and mildly low lung volumes, without acute disease.  Original Report Authenticated By: Consuello Bossier, M.D.     1. COPD exacerbation   2. Cough      MDM  Pt seen and examined. Pt speaking in full sentences but has wheezing scattered rhonchi throughout all lung fields, Pt given albuterol/atrovent, Decadron.. Peak flow done. Will do CXR because has had symptoms for 2 weeks, has been on multiple rounds of azithromycin and has a history of sarcoidosis/COPD.   Calculated peak flow 436. Pretreatment 300/290/350. Will reevaluate  X-ray reviewed by myself. No ptx, effusion. Full report per radiology.  On re-evaluation, pt feels much improved after medication, pt comfortable, VSS. Improved air movement, faint wheezing RLL.  Peak flow: 320. Discussed how to use peak flow meter. Reviewed imaging independently and discussed them with patient. Discussed signs and symptoms that should prompt return to the department. Will send her home with Levaquin, as she has multiple comorbidities and has completed 2 rounds of azithromycin. Emphasized importance of f/u. Pt  agrees.     Luiz Blare, MD 02/12/12 1504

## 2012-02-12 NOTE — ED Notes (Signed)
Given warm blankets 

## 2012-03-15 ENCOUNTER — Telehealth: Payer: Self-pay | Admitting: Pulmonary Disease

## 2012-03-15 DIAGNOSIS — I10 Essential (primary) hypertension: Secondary | ICD-10-CM

## 2012-03-15 NOTE — Telephone Encounter (Signed)
According to last labs 01/08/12 Notes Recorded by Michele Mcalpine, MD on 01/08/2012 at 5:19 PM Please notify patient>  1) BUN & Creat increased on her Lasix20mg /d & BNP=104 (norm)> rec to STOP Lasix for now... 2) Calcium back up to 11 & ACE elev at 86> c/w active Sarcoid since Pred was weaned down; rec INCREASE Pred5mg - 2tabs (10mg ) Qam. Needs f/u BMet & BNP in 6weeks without fail...   --lmomtcb x1 for pt

## 2012-03-16 NOTE — Telephone Encounter (Signed)
Pt returned triage's call.  Holly D Pryor ° °

## 2012-03-16 NOTE — Telephone Encounter (Signed)
LMOMTCB x 1 

## 2012-03-16 NOTE — Telephone Encounter (Signed)
Pt wants labs ordered so she can come in to have them done tomorrow. Per instruct below a BMET and BNP has been ordered.[name], CMA

## 2012-03-17 ENCOUNTER — Other Ambulatory Visit (INDEPENDENT_AMBULATORY_CARE_PROVIDER_SITE_OTHER): Payer: BC Managed Care – PPO

## 2012-03-17 DIAGNOSIS — I10 Essential (primary) hypertension: Secondary | ICD-10-CM

## 2012-03-17 LAB — BASIC METABOLIC PANEL
CO2: 31 mEq/L (ref 19–32)
Chloride: 106 mEq/L (ref 96–112)
Potassium: 5.3 mEq/L — ABNORMAL HIGH (ref 3.5–5.1)
Sodium: 142 mEq/L (ref 135–145)

## 2012-03-26 ENCOUNTER — Encounter (HOSPITAL_COMMUNITY): Payer: Self-pay | Admitting: Emergency Medicine

## 2012-03-26 ENCOUNTER — Emergency Department (HOSPITAL_COMMUNITY): Payer: BC Managed Care – PPO

## 2012-03-26 ENCOUNTER — Emergency Department (HOSPITAL_COMMUNITY)
Admission: EM | Admit: 2012-03-26 | Discharge: 2012-03-26 | Disposition: A | Discharge status: Home / Self Care | Payer: BC Managed Care – PPO | Attending: Emergency Medicine | Admitting: Emergency Medicine

## 2012-03-26 DIAGNOSIS — I509 Heart failure, unspecified: Secondary | ICD-10-CM

## 2012-03-26 DIAGNOSIS — R51 Headache: Secondary | ICD-10-CM | POA: Insufficient documentation

## 2012-03-26 DIAGNOSIS — E119 Type 2 diabetes mellitus without complications: Secondary | ICD-10-CM | POA: Insufficient documentation

## 2012-03-26 DIAGNOSIS — J441 Chronic obstructive pulmonary disease with (acute) exacerbation: Secondary | ICD-10-CM | POA: Insufficient documentation

## 2012-03-26 DIAGNOSIS — I2510 Atherosclerotic heart disease of native coronary artery without angina pectoris: Secondary | ICD-10-CM | POA: Insufficient documentation

## 2012-03-26 DIAGNOSIS — I10 Essential (primary) hypertension: Secondary | ICD-10-CM | POA: Insufficient documentation

## 2012-03-26 DIAGNOSIS — Z79899 Other long term (current) drug therapy: Secondary | ICD-10-CM | POA: Insufficient documentation

## 2012-03-26 LAB — CBC WITH DIFFERENTIAL/PLATELET
Basophils Absolute: 0 10*3/uL (ref 0.0–0.1)
Eosinophils Absolute: 0.2 10*3/uL (ref 0.0–0.7)
Eosinophils Relative: 1 % (ref 0–5)
HCT: 36.8 % (ref 36.0–46.0)
Lymphocytes Relative: 14 % (ref 12–46)
MCH: 29.3 pg (ref 26.0–34.0)
MCV: 90.6 fL (ref 78.0–100.0)
Monocytes Absolute: 2.1 10*3/uL — ABNORMAL HIGH (ref 0.1–1.0)
Platelets: 434 10*3/uL — ABNORMAL HIGH (ref 150–400)
RDW: 16.2 % — ABNORMAL HIGH (ref 11.5–15.5)

## 2012-03-26 LAB — COMPREHENSIVE METABOLIC PANEL
ALT: 21 U/L (ref 0–35)
CO2: 25 mEq/L (ref 19–32)
Calcium: 10 mg/dL (ref 8.4–10.5)
Creatinine, Ser: 1.6 mg/dL — ABNORMAL HIGH (ref 0.50–1.10)
GFR calc Af Amer: 38 mL/min — ABNORMAL LOW (ref >90)
GFR calc non Af Amer: 33 mL/min — ABNORMAL LOW (ref >90)
Glucose, Bld: 107 mg/dL — ABNORMAL HIGH (ref 70–99)
Sodium: 137 mEq/L (ref 135–145)
Total Protein: 7 g/dL (ref 6.0–8.3)

## 2012-03-26 LAB — POCT I-STAT TROPONIN I
Troponin i, poc: 0.05 ng/mL (ref 0.00–0.08)
Troponin i, poc: 0.06 ng/mL (ref 0.00–0.08)

## 2012-03-26 MED ORDER — PREDNISONE 20 MG PO TABS
60.0000 mg | ORAL_TABLET | Freq: Once | ORAL | Status: AC
  Administered 2012-03-26: 60 mg via ORAL
  Filled 2012-03-26: qty 3

## 2012-03-26 MED ORDER — IPRATROPIUM BROMIDE 0.02 % IN SOLN
0.5000 mg | Freq: Once | RESPIRATORY_TRACT | Status: AC
  Administered 2012-03-26: 0.5 mg via RESPIRATORY_TRACT
  Filled 2012-03-26: qty 2.5

## 2012-03-26 MED ORDER — ALBUTEROL SULFATE (5 MG/ML) 0.5% IN NEBU
5.0000 mg | INHALATION_SOLUTION | Freq: Once | RESPIRATORY_TRACT | Status: AC
  Administered 2012-03-26: 5 mg via RESPIRATORY_TRACT
  Filled 2012-03-26: qty 1

## 2012-03-26 MED ORDER — FUROSEMIDE 10 MG/ML IJ SOLN
40.0000 mg | Freq: Once | INTRAMUSCULAR | Status: AC
  Administered 2012-03-26: 40 mg via INTRAVENOUS
  Filled 2012-03-26: qty 4

## 2012-03-26 MED ORDER — NITROGLYCERIN 2 % TD OINT
1.0000 [in_us] | TOPICAL_OINTMENT | Freq: Once | TRANSDERMAL | Status: AC
  Administered 2012-03-26: 1 [in_us] via TOPICAL
  Filled 2012-03-26: qty 1

## 2012-03-26 MED ORDER — ACETAMINOPHEN 325 MG PO TABS
975.0000 mg | ORAL_TABLET | Freq: Once | ORAL | Status: AC
  Administered 2012-03-26: 975 mg via ORAL
  Filled 2012-03-26: qty 3

## 2012-03-26 NOTE — ED Provider Notes (Signed)
History     CSN: [id number]  Arrival date & time 03/26/12  4098   First MD Initiated Contact with Patient 03/26/12 1019      Chief Complaint  Patient presents with  . Asthma  . Shortness of Breath  . Headache    (Consider location/radiation/quality/duration/timing/severity/associated sxs/prior treatment) HPI  Past Medical History  Diagnosis Date  . Unspecified essential hypertension   . CAD (coronary artery disease)   . Gastritis   . Asthma   . OSA (obstructive sleep apnea)   . Esophageal reflux   . Obesity, unspecified   . Degenerative joint disease   . Anxiety state, unspecified   . Hypercalcemia   . Anemia   . Other psoriasis   . Diverticulosis   . Sarcoidosis   . Diabetes mellitus   . Kidney disease   . Dyslipidemia   . PNA (pneumonia)     With pleural effusion, requiring thoracentesis  . Pleural effusion   . COPD (chronic obstructive pulmonary disease)     Past Surgical History  Procedure Date  . Cholecystectomy   . Vesicovaginal fistula closure w/  total abdominal hysterectomy   . 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 Tonsillectomy   . Appendectomy   . Abdominal hysterectomy   . Breast reduction surgery 1982  . Open splenectomy [id number]    by DrRosenbower    Family History  Problem Relation Age of Onset  . Breast cancer    . Diabetes Mother     History  Substance Use Topics  . Smoking status: Never Smoker   . Smokeless tobacco: Never Used  . Alcohol Use: No    OB History    Grav Para Term Preterm Abortions TAB SAB Ect Mult Living                  Review of Systems  Allergies  Adhesive; Avelox; Codeine; Guaifenesin; Latex; and Oxycodone  Home Medications   Current Outpatient Rx  Name Route Sig Dispense Refill  . ALBUTEROL SULFATE HFA 108 (90 BASE) MCG/ACT IN AERS Inhalation Inhale 2 puffs into the lungs every 6 (six) hours as needed. For shortness of breath.    . ALLOPURINOL 300 MG PO TABS Oral  Take 300 mg by mouth daily.    Marland Kitchen AMLODIPINE BESYLATE 10 MG PO TABS Oral Take 10 mg by mouth daily.    . ASPIRIN EC 81 MG PO TBEC Oral Take 81 mg by mouth daily.    . ATORVASTATIN CALCIUM 20 MG PO TABS Oral Take 20 mg by mouth daily.    Marland Kitchen VITAMIN D 1000 UNITS PO TABS Oral Take 1,000 Units by mouth daily.      Marland Kitchen CLONIDINE HCL 0.1 MG PO TABS Oral Take 0.1 mg by mouth 2 (two) times daily.    Elmo Putt DM PO Oral Take 1 tablet by mouth daily as needed. For cough.    . ESOMEPRAZOLE MAGNESIUM 40 MG PO CPDR Oral Take 40 mg by mouth 2 (two) times daily.    Marland Kitchen FERROUS SULFATE 325 (65 FE) MG PO TABS Oral Take 325 mg by mouth daily with breakfast.    . FLUTICASONE PROPIONATE 50 MCG/ACT NA SUSP Nasal Place 2 sprays into the nose at bedtime. 48 g 3  . FLUTICASONE-SALMETEROL 250-50 MCG/DOSE IN AEPB Inhalation Inhale 1 puff into the lungs every 12 (twelve) hours.    Marland Kitchen GLIMEPIRIDE 4 MG PO TABS Oral Take 4 mg by mouth daily  before breakfast.    . GUAIFENESIN ER 600 MG PO TB12 Oral Take 1,200 mg by mouth 2 (two) times daily.    Marland Kitchen HYDRALAZINE HCL 10 MG PO TABS Oral Take 10 mg by mouth 3 (three) times daily.    . INSULIN DETEMIR 100 UNIT/ML [place] SOLN Subcutaneous Inject 20-80 Units into the skin 2 (two) times daily. Inject 80 units before breakfast and 20 units before supper.    . INSULIN GLULISINE 100 UNIT/ML [place] SOLN Subcutaneous Inject 20 Units into the skin 3 (three) times daily before meals. Use sliding scale as directed    . KETOROLAC TROMETHAMINE 0.4 % OP SOLN - NO CHARGE Ophthalmic Apply 1 drop to eye 3 (three) times daily.    Marland Kitchen LORAZEPAM 1 MG PO TABS Oral Take 1 mg by mouth daily as needed. For anxiety.    Marland Kitchen METOPROLOL TARTRATE 25 MG PO TABS Oral Take 12.5 mg by mouth daily.    Marland Kitchen POLYETHYLENE GLYCOL 3350 PO PACK Oral Take 17 g by mouth daily.     Marland Kitchen PREDNISONE 5 MG PO TABS Oral Take 5-10 mg by mouth daily. Alternate daily between 5mg  and 10mg .    . PREGABALIN 100 MG PO CAPS Oral Take 100 mg by mouth 2 (two) times  daily.    Marland Kitchen SITAGLIPTIN PHOSPHATE 50 MG PO TABS Oral Take 50 mg by mouth daily.      BP 163/67  Pulse 83  Temp 99.5 F (37.5 C) (Oral)  Resp 21  SpO2 98%  Physical Exam  ED Course  Procedures (including critical care time)  Labs Reviewed  CBC WITH DIFFERENTIAL - Abnormal; Notable for the following:    WBC 18.0 (*)     Hemoglobin 11.9 (*)     RDW 16.2 (*)     Platelets 434 (*)     Neutro Abs 13.2 (*)     Monocytes Absolute 2.1 (*)     All other components within normal limits  COMPREHENSIVE METABOLIC PANEL - Abnormal; Notable for the following:    Glucose, Bld 107 (*)     BUN 30 (*)     Creatinine, Ser 1.60 (*)     Albumin 3.4 (*)     GFR calc non Af Amer 33 (*)     GFR calc Af Amer 38 (*)     All other components within normal limits  PRO B NATRIURETIC PEPTIDE - Abnormal; Notable for the following:    Pro B Natriuretic peptide (BNP) 2960.0 (*)     All other components within normal limits  POCT I-STAT TROPONIN I  POCT I-STAT TROPONIN I   Dg Chest 2 View  03/26/2012  *RADIOLOGY REPORT*  Clinical Data: Asthma, difficulty breathing, sarcoid  CHEST - 2 VIEW  Comparison: 02/12/2012  Findings: Cardiomegaly.  Suspected mild interstitial edema.  No definite pleural effusion.  No pneumothorax.  Mild degenerative changes of the visualized thoracolumbar spine.  IMPRESSION: Cardiomegaly with suspected mild interstitial edema.   Original Report Authenticated By: Charline Bills, M.D.      1. Acute exacerbation of congestive heart failure     4:53 PM Handoff from Dr. Roland Rack PA-C. Patient with CHF exacerbation after recently d/c Lasix. She wants to go home. Moved to CDU after dose of lasix. She will be d/c to home after she urinates. She is to restart lasix.    Vital signs reviewed and are as follows: Filed Vitals:   03/26/12 1600  BP: 163/67  Pulse: 83  Temp:  Resp: 21   4:58 PM Patent seen and examined. She states that she is feeling somewhat better. She has  urinated 3 times.   I offered the patient additional monitoring + lasix vs discharge to home. She does not have home O2. Patient states that she wants to try to go home. She lives alone but her sister lives nearby and she will check on her.   Patient has lasix at home which she will restart immediately.   I have also asked her to follow-up with Dr. Kriste Basque next week for recheck.  Patient urged to return with worsening SOB, trouble walking, any other concerns.   She verbalizes understanding and agrees with plan.   Exam:  Gen NAD; Heart RRR, nml S1,S2, no m/r/g; Lungs CTAB, no rales or wheezing; Abd soft, NT, no rebound or guarding; Ext 2+ pedal pulses bilaterally, trace edema.     MDM  CHF exacerbation -- patient does not want to stay in hospital. Dieuresis begun in ED and patient subjectively improved. She will continue at home with PO lasix. She has pulmonary follow-up.         Renne Crigler, [place] 03/26/12 1705

## 2012-03-26 NOTE — ED Provider Notes (Signed)
History     CSN: [id number]  Arrival date & time 03/26/12  7829   First MD Initiated Contact with Patient 03/26/12 1019      Chief Complaint  Patient presents with  . Asthma  . Shortness of Breath  . Headache    (Consider location/radiation/quality/duration/timing/severity/associated sxs/prior treatment) HPI This 66 year old female with a history of asthma/COPD and sarcoidosis complains of several hours of gradual onset nonproductive cough with shortness of breath wheezing and typical headache for her when she gets coughing spells. There is no altered mental status or fever. She is no change in speech vision swallowing or understanding. She is no lateralizing or focal weakness numbness or incoordination. She is no chest pain. She is no abdominal pain. She coughs so hard she vomited once that was nonbloody. She's had no diarrhea or bloody stools. She has had generalized weakness. Her last flareup of her sarcoidosis and asthma was about a month ago. She is on chronic prednisone 10 mg daily. She's not on home oxygen. She does have CPAP for sleep apnea at home. Her weight has been stable at 220 pounds. She is no edema. She recently stopped her Lasix since she has had no current edema and she does have some baseline renal insufficiency. She has a history of peripheral edema in the past. Past Medical History  Diagnosis Date  . Unspecified essential hypertension   . CAD (coronary artery disease)   . Gastritis   . Asthma   . OSA (obstructive sleep apnea)   . Esophageal reflux   . Obesity, unspecified   . Degenerative joint disease   . Anxiety state, unspecified   . Hypercalcemia   . Anemia   . Other psoriasis   . Diverticulosis   . Sarcoidosis   . Diabetes mellitus   . Kidney disease   . Dyslipidemia   . PNA (pneumonia)     With pleural effusion, requiring thoracentesis  . Pleural effusion   . COPD (chronic obstructive pulmonary disease)     Past Surgical History  Procedure Date    . Cholecystectomy   . Vesicovaginal fistula closure w/  total abdominal hysterectomy   . 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 Tonsillectomy   . Appendectomy   . Abdominal hysterectomy   . Breast reduction surgery 1982  . Open splenectomy [id number]    by DrRosenbower    Family History  Problem Relation Age of Onset  . Breast cancer    . Diabetes Mother     History  Substance Use Topics  . Smoking status: Never Smoker   . Smokeless tobacco: Never Used  . Alcohol Use: No    OB History    Grav Para Term Preterm Abortions TAB SAB Ect Mult Living                  Review of Systems 10 Systems reviewed and are negative for acute change except as noted in the HPI. Allergies  Adhesive; Avelox; Codeine; Guaifenesin; Latex; and Oxycodone  Home Medications   Current Outpatient Rx  Name Route Sig Dispense Refill  . ALBUTEROL SULFATE HFA 108 (90 BASE) MCG/ACT IN AERS Inhalation Inhale 2 puffs into the lungs every 6 (six) hours as needed. For shortness of breath.    . ALLOPURINOL 300 MG PO TABS Oral Take 300 mg by mouth daily.    Marland Kitchen AMLODIPINE BESYLATE 10 MG PO TABS Oral Take 10 mg by mouth daily.    Marland Kitchen  ASPIRIN EC 81 MG PO TBEC Oral Take 81 mg by mouth daily.    . ATORVASTATIN CALCIUM 20 MG PO TABS Oral Take 20 mg by mouth daily.    Marland Kitchen CLONIDINE HCL 0.1 MG PO TABS Oral Take 0.1 mg by mouth 2 (two) times daily.    Elmo Putt DM PO Oral Take 1 tablet by mouth daily as needed. For cough.    . ESOMEPRAZOLE MAGNESIUM 40 MG PO CPDR Oral Take 40 mg by mouth 2 (two) times daily.    Marland Kitchen FERROUS SULFATE 325 (65 FE) MG PO TABS Oral Take 325 mg by mouth daily with breakfast.    . FLUTICASONE PROPIONATE 50 MCG/ACT NA SUSP Nasal Place 2 sprays into the nose at bedtime. 48 g 3  . FLUTICASONE-SALMETEROL 250-50 MCG/DOSE IN AEPB Inhalation Inhale 1 puff into the lungs every 12 (twelve) hours.    Marland Kitchen GLIMEPIRIDE 4 MG PO TABS Oral Take 4 mg by mouth daily before breakfast.     . GUAIFENESIN ER 600 MG PO TB12 Oral Take 1,200 mg by mouth 2 (two) times daily.    . INSULIN DETEMIR 100 UNIT/ML [place] SOLN Subcutaneous Inject 80 Units into the skin daily. Inject 80 units before breakfast and 20 units before supper.    . INSULIN GLULISINE 100 UNIT/ML [place] SOLN  Take 20 units at breakfast, 34 units at lunch and 30 units at dinner to be Used with  sliding scale as directed    . KETOROLAC TROMETHAMINE 0.4 % OP SOLN - NO CHARGE Ophthalmic Apply 1 drop to eye 3 (three) times daily.    Marland Kitchen LORAZEPAM 1 MG PO TABS Oral Take 1 mg by mouth daily as needed. For anxiety.    Marland Kitchen METOPROLOL TARTRATE 25 MG PO TABS Oral Take 12.5 mg by mouth daily.    Marland Kitchen POLYETHYLENE GLYCOL 3350 PO PACK Oral Take 17 g by mouth daily.     Marland Kitchen PREDNISONE 5 MG PO TABS Oral Take 5 mg by mouth daily. Alternate daily between 5mg  and 10mg .    . SITAGLIPTIN PHOSPHATE 50 MG PO TABS Oral Take 50 mg by mouth daily.    Marland Kitchen HYDRALAZINE HCL 10 MG PO TABS  TAKE 1 TABLET BY MOUTH 3 TIMES DAILY 90 tablet 5  . LYRICA 100 MG PO CAPS  take 1 capsule by mouth at bedtime 30 capsule 5    BP 163/67  Pulse 83  Temp 99.5 F (37.5 C) (Oral)  Resp 21  SpO2 98%  Physical Exam  Nursing note and vitals reviewed. Constitutional:       Awake, alert, nontoxic appearance but moderate respiratory distress speaking in phrases  HENT:  Head: Atraumatic.  Eyes: Right eye exhibits no discharge. Left eye exhibits no discharge.  Neck: Neck supple.  Cardiovascular: Normal rate and regular rhythm.   No murmur heard. Pulmonary/Chest: She is in respiratory distress. She has wheezes. She has no rales. She exhibits no tenderness.       Moderate respiratory distress, speaks phrases, room air pulse oximetry normal at 97, diffuse mild wheezes with tachypnea and mild retractions without accessory muscle usage or crackles or rhonchi  Abdominal: Soft. Bowel sounds are normal. There is no tenderness. There is no rebound.  Musculoskeletal: She exhibits no edema  and no tenderness.       Baseline ROM, no obvious new focal weakness.  Neurological: She is alert.       Mental status and motor strength appears baseline for patient and situation.  Skin:  No rash noted.  Psychiatric: She has a normal mood and affect.    ED Course  Procedures (including critical care time) ECG: Sinus rhythm, ventricular rate 93, normal axis, normal intervals, no acute ischemic changes noted, no significant change compared to January 2013  1335 not better will re-start Lasix, Pt still wants discharge if possible so will keep in ED for now. Labs Reviewed  CBC WITH DIFFERENTIAL - Abnormal; Notable for the following:    WBC 18.0 (*)     Hemoglobin 11.9 (*)     RDW 16.2 (*)     Platelets 434 (*)     Neutro Abs 13.2 (*)     Monocytes Absolute 2.1 (*)     All other components within normal limits  COMPREHENSIVE METABOLIC PANEL - Abnormal; Notable for the following:    Glucose, Bld 107 (*)     BUN 30 (*)     Creatinine, Ser 1.60 (*)     Albumin 3.4 (*)     GFR calc non Af Amer 33 (*)     GFR calc Af Amer 38 (*)     All other components within normal limits  PRO B NATRIURETIC PEPTIDE - Abnormal; Notable for the following:    Pro B Natriuretic peptide (BNP) 2960.0 (*)     All other components within normal limits  POCT I-STAT TROPONIN I  POCT I-STAT TROPONIN I  LAB REPORT - SCANNED   No results found.   1. Acute exacerbation of congestive heart failure       MDM  Pt stable in ED with no significant deterioration in condition.        Hurman Horn, MD 03/30/12 1125

## 2012-03-26 NOTE — ED Provider Notes (Signed)
2:49 PM BP 173/63  Pulse 80  Temp 99.5 F (37.5 C) (Oral)  Resp 23  SpO2 96% Assumed care of patient in CDU. Patient has a history of sarcoidosis. She came in complaining of shortness of breath and wheezing. She sees Dr. Red Christians for CHF. Patient was found today to  have an elevated BNP of 3000 and and fluid volume in the lungs. She was recently taken off of her Lasix. She is here in the CDU for observation. She'll receive Lasix. If her shortness of breath resolves she may go home and continue her normal dose of lasix.  She may also resume her maintenance dose of prednisone at 10 mg. Patient is on home oxygen.  CV: RRR, No M/R/G, Peripheral pulses intact.  Lungs: patient on oxygen, sats are 99% on o2 by by nasal cannula. This work of breathing. No accessory muscle use appear. No wheezing noted. No audible rails. Abd: Obese ,Soft, Non tender, non distended   report ort is given to Rhea Bleacher, PA-C assumed care of patient. 3:48 PM     Arthor Captain, PA-C 03/26/12 1548

## 2012-03-26 NOTE — ED Notes (Signed)
Pt c/o increased SOB and wheezing starting last night with HA and abd pain and pressure; pt sts recently taken off fluid pills and sts some leg swelling yesterday; pt tachypnic and labored at present

## 2012-03-27 ENCOUNTER — Other Ambulatory Visit: Payer: Self-pay | Admitting: Pulmonary Disease

## 2012-03-27 ENCOUNTER — Telehealth: Payer: Self-pay | Admitting: Pulmonary Disease

## 2012-03-27 NOTE — Telephone Encounter (Signed)
Per SN---cont the same meds for now and we will discuss all at her ov on Thursday.  Called and spoke with pt and she voiced her understanding of these recs from SN.  Nothing further needed at this time.

## 2012-03-27 NOTE — Telephone Encounter (Signed)
Scheduled appt for the pt to come in and see  SN on Thursday at 10.  Pt is aware.  She stated that she was seen in the er yesterday and started back on lasix 40 mg  Daily.  She is not having any problems at this time just very tired.  Wanted to make sure SN did not want her to change any of her meds before this appt.  SN please advise. Thanks  Allergies  Allergen Reactions  . Adhesive (Tape) Other (See Comments)    Blisters   . Avelox (Moxifloxacin Hcl In Nacl)     GI upset  . Codeine Other (See Comments)    "crazy"   . Guaifenesin Nausea And Vomiting  . Latex Swelling  . Oxycodone Nausea And Vomiting

## 2012-03-30 ENCOUNTER — Ambulatory Visit (INDEPENDENT_AMBULATORY_CARE_PROVIDER_SITE_OTHER): Payer: BC Managed Care – PPO | Admitting: Pulmonary Disease

## 2012-03-30 ENCOUNTER — Encounter: Payer: Self-pay | Admitting: Pulmonary Disease

## 2012-03-30 VITALS — BP 140/70 | HR 81 | Temp 96.8°F | Ht 62.5 in | Wt 209.8 lb

## 2012-03-30 DIAGNOSIS — M1990 Unspecified osteoarthritis, unspecified site: Secondary | ICD-10-CM

## 2012-03-30 DIAGNOSIS — K5730 Diverticulosis of large intestine without perforation or abscess without bleeding: Secondary | ICD-10-CM

## 2012-03-30 DIAGNOSIS — E669 Obesity, unspecified: Secondary | ICD-10-CM

## 2012-03-30 DIAGNOSIS — N259 Disorder resulting from impaired renal tubular function, unspecified: Secondary | ICD-10-CM

## 2012-03-30 DIAGNOSIS — I679 Cerebrovascular disease, unspecified: Secondary | ICD-10-CM

## 2012-03-30 DIAGNOSIS — M109 Gout, unspecified: Secondary | ICD-10-CM

## 2012-03-30 DIAGNOSIS — I2510 Atherosclerotic heart disease of native coronary artery without angina pectoris: Secondary | ICD-10-CM

## 2012-03-30 DIAGNOSIS — D869 Sarcoidosis, unspecified: Secondary | ICD-10-CM

## 2012-03-30 DIAGNOSIS — K219 Gastro-esophageal reflux disease without esophagitis: Secondary | ICD-10-CM

## 2012-03-30 DIAGNOSIS — I10 Essential (primary) hypertension: Secondary | ICD-10-CM

## 2012-03-30 DIAGNOSIS — J45909 Unspecified asthma, uncomplicated: Secondary | ICD-10-CM

## 2012-03-30 DIAGNOSIS — E119 Type 2 diabetes mellitus without complications: Secondary | ICD-10-CM

## 2012-03-30 DIAGNOSIS — G4733 Obstructive sleep apnea (adult) (pediatric): Secondary | ICD-10-CM

## 2012-03-30 DIAGNOSIS — E7800 Pure hypercholesterolemia, unspecified: Secondary | ICD-10-CM

## 2012-03-30 DIAGNOSIS — I5032 Chronic diastolic (congestive) heart failure: Secondary | ICD-10-CM

## 2012-03-30 NOTE — Patient Instructions (Addendum)
Today we updated your med list in our EPIC system...    Continue your current medications the same...    We decided to DECREASE the PREDNISONE to 5mg  daily...    Keep the LASIX at 40mg  daily for now...  Let's plan a follow up Metabolic panel (BMet) in one week...    We will call you w/ these results...  Let's plan a follow up visit in 1 month.Marland KitchenMarland Kitchen

## 2012-03-30 NOTE — Progress Notes (Signed)
Subjective:    Patient ID: [name], female    DOB: [birth year], 66 y.o.   MRN: [id number]  HPI 66 y/o BF here for a follow up visit... he has multiple medical problems as noted below...    ~  November 24, 2010:  She was hospitalized 2/12 w/ open splenectomy done by [name]> path showed extensive granulomatous inflamm, no evid malignancy;  Post-op her calcium level returned to normal w/o further intervention;  She had several post-op complications including a subphrenic abscess that required percutaneous drainage, and a left pleural effusion/ basilar atelectasis (s/p thoracentesis & improved)... She has improved slowly at home w/ family help & is planning return to work 11/30/10;  She notes sl cough w/ sm amt thick beige sput, & SOB/ weakness is gradually better;  Denies CP etc, notes insomnia & anxiety...  CXR today w/ improved left base, no clear baseline film;  Labs show Hg=11.6, BS=117, Creat=1.6, FLP looks good, LFTs are OK x sl incr AlkPhos, but note Ca=11.1 (was 8.6 one month ago)...  ~  March 01, 2011:  32mo ROV & she presents w/ recent cough, congestion, green drainage, and tired/ SOB/ etc; she notes incr arthritis pain, aching/ sore and some constipation;  We discussed checking f/u CXR (clear, NAD); and Labs- CBC-ok, BS=167, A1c=8.4, BUN=44, Creat=1.9, Ca=12.0, Phos=4.0, AlkPhos=167, GGT=151, ACE=78;  We discussed Rx w/ Avelox, Mucinex, Fluids; and incr Miralax/ Senakot-S/ MOM for the constipation...    Recurrent hypercalcemia most likely from active Sarcoidosis> labs w/ incr AlkPhos & GGT> we will image the liver at this time to compare to prev; and consult w/ Endocrinology for their opinion regarding the hypercalcemia AND her IDDM (esp in view of poss Prednisone rx for the calcium & sarcoid)...    Renal Insuffic followed by [name] for Nephrology> Creat up to 1.9 from 1.6 last, needs incr fluid intake, she knows to avoid nephrotoxic agents/ meds/ etc...    She has known MGUS followed by  [name] but prev bone marrow was neg etc; due for MGUS labs this fall, current CBC is stable: Hg=12.8, Fe=45...  ~  May 27, 2011:  32mo ROV & she has established w/ [name] for Endocrine on Levemir & Apidra, along w/ Glimepiride & Januvia; we called in Prednisone 20mg /d in early Oct to start rx for her high calcium levels & labs from DrA's office reviewed >> Calcium has improved to 10.3 on 3wks of oral Pred...    Sarcoidosis w/ HYPERCALCEMIA> Ca++ was 12.4 -13.4 in Aug & Sept; since starting Pred20mg /d in early Oct her Ca++ has improved to 10.3 now; we decided to slowly taper the Pred in light of her IDDM & improved Ca++ level (try 20mg , alt w/ 10mg  Qod)...    HBP> on Norvasc & Diovan; BP= 132/80 & she denies CP, palpit, dizzy, ch in SOB/ DOE, edema, etc...    CHOL> on Lip20; FLP 4/12 looked good & we reviewed low fat diet restrictions...    DM on Insulin> as above, followed by DrA on Levemir30 & ApidraSS coverage at meals; BS at home varies 150-400+, FBS today=80, latest A1c was 7.6 in Aug...    Obesity> wt= 180# (63"Tall & BMI=32) which is up 8# and we reviewed diet, exercise, & wt reduction strategies...    GI> GERD on NexiumBid; and we reviewed antireflux regimen (elev HOB, npo after dinner, etc)...    Renal Insuffic> Creat had incr to 2.3 when Ca++ rose to 13.4, but has returned to 1.8 now; advised incr  free water intake...    Anxiety> on Alprazolam prn; she's been under extra stress w/ loss of job, this illness, family issues...  ~  June 28, 2011:  49mo ROV> on Pred 20-10 Qod for the last month and BMet today shows Ca++=9.8;  She is anxious to wean the Pred as quickly as poss due to incr appetite, weight gain, difficulty controlling her DM, etc;  She has been seeing [name] every 3-4wks w/ adjustments in her insulin> currently on Levemir 46u qd; Apidra 10+SS Tid at meals, Amaryl & Januvia;  BS's vary tremendously & she is struggling w/ diet (weight up 8# in the last month to  188# today);  We decided to cut the Pred to 10mg /d now and then further to 10-5 qod after 3wks w/ ROV in 6 weeks...  ~  August 16, 2011:  6wk ROV & post hosp check> she was Adm [facility] 1[facility] - 08/12/11 w/ dyspnea (DOE, orthopnea, edema) thought to be due to diastolic heart failure (2DEcho w/ Gr2DD, BNP=2200), she was diuresed & improved, disch on LASIX40mg - 1/2 tab each am & her prev Diovan was stopped (due to renal insuffic w/ Creat=2.4)...  Her serum Calcium levels were all norm 9.6-9.8;  A1c=9.3;  Hg=10.4>> See below...    Feeling better since disch;  Weight 196# is still 8# heavier than her last visit w/ me 11/12;  We decided to decr her Pred from 10-5 Qod to 5mg /d & keep Lasix at 20mg /d... LABS 1/13:  BUN=33, Creat=1.7, BNP=100;  Ca=9.7;  Hg=12.9;  BS=69 & she will f/u w/ [name]...  ~  October 06, 2011:  68mo ROV on Pred 5mg /d & generally stable but w/ mult minor somatic complaints- sl SOB, some cough, thick white mucous, right arm discomfort, & reflux symptoms;  She had a ZPak called in about 2wks ago;  She is on a good antireflux regimen, plus Flonase & Saline;  We will add Tussionex for prn use;  Unfortunately weight is up 9# further tp 205# today;  BP remains controlled on 5 MEDS (see below);  She continues to f/u w/ [name] for DM> on Levimir 46u & Apidra 03-15-13;  We decided to cut the Pred to 5mg  QOD & keep the Lasix 20mg /d... LABS 3/13:  BS=135,  BUN=38, Creat=1.6;  Ca=10.1;  ACE=48;  BNP=128...  ~  January 06, 2012:  46mo ROV & [name] has been on Pred5mg Qod for the last 46mo; she has noted some joint pains & has rash on her arms- seeing derm w/ hx Psoriasis but she feels their topical rx isn't working; she also notes sl cough, some greenish sput production, chest feels tight, & some right sided chest discomfort- she is requesting a ZPak which seems to work for her...     She saw [name] 4/13> Ave BS from her machine 249 & A1c=9.1, insulin adjusted; FLP looked good on Lip20; extensive note  reviewed...    [name], meds, xrays and labs> see below>> LABS 6/13:  Chems- BS=169 BUN=41 Creat=2.3 BNP=104 Ca=11.0 ACE=86;  CBC- ok w/ Hg=12.4 REC> Stop Lasix20 for now, increase Pred to 10mg /d, ROV 6wks w/ BMet...  ~  March 30, 2012:  46mo ROV & post-ER visit> she's been to the ER twice in the last 68mo w/ SOB, cough, congestion, etc; on Pred, Advair, Mucinex, Tussionex, & given antibiotics, Neb Rx, etc & improved;  Also rec to restart     She saw DrWall for Cards f/u 7/13> known CAD, DOE, good BP control &  2DEcho showed good LVF & no evid for pulmHTN; continue same meds, & Lasix...    She had f/u [name] 6/13> DM, renal insuffic, hyperlipidemia, hypercalcemia, etc; on Levemir80, Apidra20-34-30, [id number] & Amaryl4; BS=118 A1c=8.8.Marland KitchenMarland Kitchen [name], meds, xrays and labs> see below for updates >> CXR 8/13 showed cardiomeg, ?mild interstitial edema, mild DJD TSpine... EKG 8/13 showed NSR, rate93, min NSSTTWA, NAD... LABS 8/13:  CBC- Hg=11.9 WBC=18K;  Chems- BUN=30 Creat=1.6 Ca=10.0 BNP=2960 ==> started back on Lasix40/d...          Problem List:    OBSTRUCTIVE SLEEP APNEA - Sleep Study 11/07 showed RDI=21 w/ desat to 62% during REM.Marland Kitchen. eval by DrSood w/ Rx for CPAP 12... compliance poor- only using it 1-2 times/wk... encouraged to use CPAP more regularly & to f/u w/ DrSood regarding the mask interface problems... ~  8/11:  she reports new mask but still not using CPAP regularly, "I rest fairly well". ~  7/12:  She reports not using her CPAP, and not resting well... ~  1/13:  CPAP used briefly during [facility] for diastolic CHF, but still not compliant w/ home use...  BRONCHITIS, ACUTE - on ADVAIR 250Bid (using Prn only) & PROAIR as needed. 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 ~  CXR 1/09 was pre-op for TKR- sl cardomeg & ?mild vasc congestion. ~  CXR 2/11 showed norm heart size & vascularity, clear, s/p  cholecystectomy, DJD in TSpine. ~  CXR & CT Chest 11/11 showed sl peribronch thickening, no lung lesions, no signif adenopathy. ~  PET scan 1/12 showed hypermetabolic activ in spleen & lymph nodes in the supraclav, hilar, mediastinal, periaotic & iliac region as well. ~  CXRs 3/12 in the perioperative period after splenectomy showed left effusion==> tapped & improved aeration. ~  CXR 4/12 back to baseline w/ clear bases, essent wnl... ~  CXR 7/12 showed norm heart size, clear lungs, no definite adenopathy, NAD... ~  LABS 7/12 w/ ACE=78 (8-52), Ca=12.0, AlkPhos=176 (39-117), GGT=151 (7-51) ~  Labs from [name]> 8/12: Ca=12.4, Creat=1.8; then 9/12: Ca=13.4, Creat=2.3;  ~  10/12:  PREDNISONE 20mg /d started 10/12 & 3wks later Ca=10.3, Creat=1.8; therefore weaned to 20-10 Qod. ~  11/12: Labs on Pred 20-10 Qod showed Ca++= 9.8; we will continue to wean Pred. ~  1/13:  CXR showed cardiomeg, mild interstitial edema, DJD spine; Labs showed Ca=9.7 & we decided to wean Pred to 5mg /d... ~  3/13:  Labs showed Ca=10.1 on Pred 5mg /d; decided to wean to 5mg Qod... ~  6/13:  Labs showed Ca=11.0, ACE=86; on Pred5Qod & rec to incr back to 10mg /d... ~  8/13:  CXR showed cardiomeg, ?mild interstitial edema, mild DJD TSpine... ~  Labs 8/13 showed Ca=10.0 on Pred10/d; rec to decr to 05-06-09-5  HYPERTENSION - taking METOPROLOL 25mg - 1/2 daily, NORVASC 10mg /d, APRESOLINE 10mg Tid, CLONIDINE 0.1mg Bid, LASIX 40mg - see below; off prev [id number] due to azotemia w/ diuresis. ~  4/12:  BP here 132/62 and she denies HA, visual changes, CP, palipit, dizziness, syncope, change in dyspnea, etc... ~  10/12:  BP= 132/80 & she denies CP, palpit, dizzy, ch in SOB/ DOE, edema, etc... ~  11/12:  BP= 132/82 & she is relatively asymptomatic other than DOE from her weight and some musc cramps... ~  1/13: Post Hosp BP= 146/70 on ? - Metop12.5, Amlod10, Clonid0.1Bid, Lasix20; she denies CP, palpit, SOB, edema> improved from recent  hosp... ~  3/13: She called in the interim for "refill" Apresoline not prev  on any med list; BP= 150/80 on all 5 meds; must elim sodium & get wt down... ~  6/13:  BP= 142/80 on but BUN-41 Creat=2.3 BNP=104; rec to stop Lasix for now, f/u BMet 6wks. ~  8/13:  BP= 140/70 on same meds & BUN=30, Creat=1.6, but BNP incr to 2960 & LASIX 40mg /d restarted...  CAD - on ASA 81mg /d...  ~  Hx non-obstructive CAD w/ cath 6/07 by DrStuckey showing luminal irregularities & tiny first marginal branch of the CIRC w/ ostial lesion... good LVF. ~  2DEcho 9/07 showed mildly calcif AoV and normal LVF & wall motion. ~  2DEcho 11/11 showed norm LV wall thickness, norm LVF w/ EF= 60-65%, norm atria, norm valves, trivial peric fluid behind heart. ~  2DEcho 1/13 showed mild LVH, norm LVF w/ EF=55-60% & no regional wall motion abn, Gr2DD, norm RV...  CEREBROVASCULAR DISEASE - she remains on ASA 81mg /d without TIA's or other neuro manifestations...  ~  MRA Br 9/07 showed mod intracranial atherosclerotic changes...   HYPERLIPIDEMIA - on LIPITOR 20mg /d... FLP monitored at each OV by [name]... ~  FLP 7/07 showed TChol 114, TG 85, HDL 28, LDL 69 ~  FLP 4/09 showed TChol 154, TG 192, HDL 34, LDL 82... discussed poss of adding fibrate- hold... ~  FLP 2/10 on Lip20 showed TChol 159, TG 207, HDL 29, LDL 83... rec> add Fenoglide (she didn't). ~  FLP 2/11 on Lip20 showed TChol 185, TG 242, HDL 43, LDL 99... add FENOFIBRATE 160mg /d... ~  FLP 8/11 on Lip20+Feno160 showed TChol 167, TG 216, HDL 37, LDL 96... she stopped Feno160. ~  FLP 4/12 on Lip20 showed TChol 146, TG 115, HDL 35, LDL 88 ~  FLP 2/13 on Lip20 showed TChol 159, TG 176, HDL 41, LDL 86  DM - on LEVEMIR & APIDRA SS coverage now per [name]; plus GLIMEPIRIDE 4mg /d and JANUVIA 50mg /d. ~  labs 4/08 showed BS=138 & HgA1c=7.5.Marland Kitchen. home BS = 100 to >300... misses doses and not on diet or exercising... ~  1/09 became hypoglycemic in hosp on 4 meds post knee  surg- meds adjusted... ~  3/09 restarted GLIMEPIRIDE 4mg - 1/2 tab each AM, & continue Lantus & Metformin... ~  labs 4/09 showed BS= 148, HgA1c= 7.3.Marland Kitchen. rec> back on 4 med regimen due to poor control at home. ~  6/09 discussed titrating the Lantus up until FBS 100-120 range... ~  labs 2/10 showed BS= 164, HgA1c= 8.6.Marland KitchenMarland Kitchen very disappointing- needs home BS monitoring, incr Lantus. ~  labs 2/11 showed BS= 298, A1c= 8.4...  discussed change Lantus to LEVEMIR 40 u daily... ~  labs 8/11 showed BS= 202, A1c= 10.9.Marland Kitchen. rec> incr Levemir to 50, consider Humalog. ~  Nov-Dec 2011:  BS up w/ adjust of meds & addition of Pred after hosp 11/11 & hypercalcemia... ~  She states she returned to Levemir 40u daily after the splenectomy hosp due to appetite etc;  4/12 BS=117 ~  Labs 7/12 showed BS= 167, A1c= 8.4> on Levemir 50u/d & Amaryl 4mg  Qam (note creat 1.9). ~  8-10/12: on Levemir30 + ApidraSS per DrA & BS are 150-400+ due to the Pred20; A1c 8/12 was 7.6 ~  11/12:  DrA has increased her Levemir to 46u/d & the Apidra to 10+SS cover Tid... BS here= 168 ~  1/13: Covered w/ SSI in Hosp but A1c=9.3 (Creat up to 2.4 but improved to 1.7 w/ adjust meds)... ~  3/13: she continues to follow w/ [name] on Levimir & Apidra;  BS=135  today... ~  6/13:  She indicates DrA adjusted insulin> Levemir 60AM+20PM, Apidra 20-20-20 +SS amt...  OBESITY (ICD-278.00) - weight down to 171 after hosp 11/11- doing better on diet, not yet exercising. ~  weight 220-230# in the early 1990's... ~  weight 210-220# in the early 2000's... ~  weight 2/10 = 192# ~  weight 2/11 = 186# ~  weight 8/11 = 174# ~  weight 12/11 = 171# ~  Weight 4/12 = 168# ~  Weight 7/12 = 171# ~  Weight 10/12 = 180# ~  Weight 11/12 = 188# ~  Weight 1/13 = 196# ~  Weight 3/13 = 205#... She MUST get back on diet, incr exercise, get wt down. ~  Weight 6/13 = 205# ~  Weight 8/13 = 210#  HYPERCALCEMIA> this was most likely due to sarcoidosis w/ extensive splenic  involvement;  Calcium was as hight as 13.7 and returned to normal after the splenectomy. ~  Labs 4/12 showed calcium = 11.1 & this is very disappointing... ~  Labs 7/12 showed Ca= 12.0, Phos= 4.0, ACE= 78 ~  Labs 8-10/12 showed Ca up to 13.4, then down to 10.3 on PRED20mg /d... ~  Labs 11/12 showed Ca= 9.8 on Pred 20-10 qod; we will continue to wean. ~  Labs 1/13 in [place] showed Ca= 9.7 range on Pred 10-5 Qod;  We decided to wean Pred to 5mg /d... ~  Labs 3/13 showed Ca=10.1 on Pred5mg /d; rec to decr to 5mg Qod... ~  Labs 6/13 showed Ca=11.0 on Pred5Qod; rec incr to 10mg /d... ~  Labs 8/13 showed Ca=10.0 on Pred10/d; rec to decr to 05-06-09-5  GERD - on NEXIUM 40mg  Bid... w/ increased reflux symptoms w/ noct cough etc...  ~  2/10: discussed optimal Rx w/ Nex before dinner, Zantac300 + Reglan10 at bed, elev HOB, etc. ~  2/11: she stopped the Reglan, still using Nexium/ Zantac but w/ persist symptoms>  refer to GI for eval. ~  6/11: GI f/u DrPerry w/ EGD that was normal... continue Rx. ~  12/11:  increased GI symptoms post hosp> she will f/u w/ DrPerry for his input> incr Nexium Bid.  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 ~  6/11:  f/u colonoscopy by DrPerry showed divertics, 4 polyps, AVM.Marland KitchenMarland Kitchen path= tubular adenoma, f/u 34yrs.  SPLENOMEGALY w/ innumerable ~1cm lesions ?etiology >> SEE ABOVE ~  2/12:  S/p open splenectomy by [name] w/ extensive granulomatous inflamm found... ~  Note: Serum Calcium ret to normal immediately after spleen removed...  RENAL INSUFFICIENCY (ICD-588.9) >> Creat ~ 2.0 & eval 10/11 by [name]- prob due to DM & HBP... ~  Labs 4/12 showed BUN= 27, Creat= 1.6 ~  Labs 7/12 showed BUN= 44, Creat= 1.9 (not on diuretics/ NSAIDs/ etc)... ~  Labs showed Creat up to 2.3 when Ca=13.4; now improved to 1.8 w/ Ca coming down... ~  Labs 11/12  showed BUN=38, Creat=1.7; rec to maintain hydration... ~  Labs 1/13 in [place] w/ diuresis showed Creat incr to 2.4 but ret to 1.7 w/ adjustment in meds... ~  Labs 3/13 showed BUN=38, Creat= 1.6 ~  Labs 6/13 showed BUN=41, Creat= 2.3; rec> stop Lasix20 for now, liberalize fluids.  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 on LYRICA 100mg  Qhs, off prev Tramadol Rx... ~  2010 eval by DrHiatt @ [facility] on Tramadol 50mg , Lyrica 75mg Bid...  GOUT,  UNSPECIFIED (ICD-274.9) - Uric in the 7-8 range...on ALLOPURINOL 300mg /d for prevention...  ANXIETY (ICD-300.00) - she is under mod stress- work, mother died, hospice counselling, etc... ~  8/11:  rec starting Alprazolam 0.5mg  Tid for palpit, SOB, anxiety...  ANEMIA-NOS & MGUS >> see eval 10-11/11 by [name] w/ bone marrow 11/11 in hosp= neg. ~  Labs 4/12 showed Hg= 11.6, MCV= 84... On FeSO4 daily. ~  Labs 7/12 showed Hg= 12.8, MCV= 90, Fe= 45 (14%sat)... ~  Labs 1/13 in [place] showed Hg=10.4 but improved to 12.9 on post hosp check in office... ~  Labs 6/13 showed Hg= 12.4  PSORIASIS - eval and Rx per DrTafeen prev on Humira injections- stopped 9/11...   Past Surgical History  Procedure Date  . Cholecystectomy   . Vesicovaginal fistula closure w/  total abdominal hysterectomy   . 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 Tonsillectomy   . Appendectomy   . Abdominal hysterectomy   . Breast reduction surgery 1982  . Open splenectomy [id number]    by DrRosenbower    Outpatient Encounter Prescriptions as of 03/30/2012  Medication Sig Dispense Refill  . albuterol (PROVENTIL HFA;VENTOLIN HFA) 108 (90 BASE) MCG/ACT inhaler Inhale 2 puffs into the lungs every 6 (six) hours as needed. For shortness of breath.      . allopurinol (ZYLOPRIM) 300 MG tablet Take 300 mg by mouth daily.      Marland Kitchen amLODipine (NORVASC) 10 MG tablet Take 10 mg by mouth daily.      Marland Kitchen aspirin  EC 81 MG tablet Take 81 mg by mouth daily.      Marland Kitchen atorvastatin (LIPITOR) 20 MG tablet Take 20 mg by mouth daily.      . cloNIDine (CATAPRES) 0.1 MG tablet Take 0.1 mg by mouth 2 (two) times daily.      Marland Kitchen Dextromethorphan-Guaifenesin (TUSSIN DM PO) Take 1 tablet by mouth daily as needed. For cough.      . esomeprazole (NEXIUM) 40 MG capsule Take 40 mg by mouth 2 (two) times daily.      . ferrous sulfate 325 (65 FE) MG tablet Take 325 mg by mouth daily with breakfast.      . fluticasone (FLONASE) 50 MCG/ACT nasal spray Place 2 sprays into the nose at bedtime.  48 g  3  . Fluticasone-Salmeterol (ADVAIR) 250-50 MCG/DOSE AEPB Inhale 1 puff into the lungs every 12 (twelve) hours.      Marland Kitchen glimepiride (AMARYL) 4 MG tablet Take 4 mg by mouth daily before breakfast.      . guaiFENesin (MUCINEX) 600 MG 12 hr tablet Take 1,200 mg by mouth 2 (two) times daily.      . hydrALAZINE (APRESOLINE) 10 MG tablet TAKE 1 TABLET BY MOUTH 3 TIMES DAILY  90 tablet  5  . insulin detemir (LEVEMIR) 100 UNIT/ML injection Inject 80 Units into the skin daily. Inject 80 units before breakfast and 20 units before supper.      . insulin glulisine (APIDRA SOLOSTAR) 100 UNIT/ML injection Take 20 units at breakfast, 34 units at lunch and 30 units at dinner to be Used with  sliding scale as directed      . ketorolac (ACULAR LS) 0.4 % SOLN Apply 1 drop to eye 3 (three) times daily.      Marland Kitchen LORazepam (ATIVAN) 1 MG tablet Take 1 mg by mouth daily as needed. For anxiety.      Marland Kitchen LYRICA 100 MG capsule take 1 capsule  by mouth at bedtime  30 capsule  5  . metoprolol tartrate (LOPRESSOR) 25 MG tablet Take 12.5 mg by mouth daily.      . polyethylene glycol (MIRALAX / GLYCOLAX) packet Take 17 g by mouth daily.       . predniSONE (DELTASONE) 5 MG tablet Take 5-10 mg by mouth daily. Alternate daily between 5mg  and 10mg .      . sitaGLIPtin (JANUVIA) 50 MG tablet Take 50 mg by mouth daily.      Marland Kitchen DISCONTD: cholecalciferol (VITAMIN D) 1000 UNITS tablet  Take 1,000 Units by mouth daily.        Marland Kitchen DISCONTD: hydrALAZINE (APRESOLINE) 10 MG tablet Take 10 mg by mouth 3 (three) times daily.        Allergies  Allergen Reactions  . Adhesive (Tape) Other (See Comments)    Blisters   . Avelox (Moxifloxacin Hcl In Nacl)     GI upset  . Codeine Other (See Comments)    "crazy"   . Guaifenesin Nausea And Vomiting  . Latex Swelling  . Oxycodone Nausea And Vomiting    Current Medications, Allergies, Past Medical History, Past Surgical History, Family History, and Social History were reviewed in [employer] record.   Review of Systems         See HPI - all other systems neg except as noted... The patient complains of dyspnea on exertion.  The patient denies anorexia, fever, weight loss, weight gain, vision loss, decreased hearing, hoarseness, chest pain, syncope, peripheral edema, prolonged cough, headaches, hemoptysis, abdominal pain, melena, hematochezia, severe indigestion/heartburn, hematuria, incontinence, muscle weakness, suspicious skin lesions, transient blindness, difficulty walking, depression, unusual weight change, abnormal bleeding, enlarged lymph nodes, and angioedema.     Objective:   Physical Exam      WD, Overweight, 66 y/o BF in NAD...  GENERAL:  Alert & oriented; pleasant & cooperative... HEENT:  /AT, EOM-wnl, PERRLA, EACs-clear, TMs-wnl, NOSE-clear, THROAT- clear & wnl... NECK:  Supple w/ fair ROM; no JVD; normal carotid impulses w/o bruits; palp thyroid, w/o nodules felt; no lymphadenopathy. CHEST:  Clear to P & A; without wheezes/ rales/ or rhonchi. HEART:  Regular Rhythm; gr 1/6 SEM, without rubs/ or gallops. ABDOMEN:  Scar from splenectomy surg; obese, soft, & nontender w/ panniculus; normal bowel sounds; no organomegaly or masses detected EXT: without deformities, mod arthritic changes, s/p bilat TKRs; no varicose veins/ +venous insuffic/ tr edema. NEURO:  CN's intact; no focal neuro  deficits... DERM:  Psoriasis rash per DrTafeen...  RADIOLOGY DATA:  Reviewed in the EPIC EMR & discussed w/ the patient...  LABORATORY DATA:  Reviewed in the EPIC EMR & discussed w/ the patient...   Assessment & Plan:    HBP>  Last OV we stopped Lasix due to incr Creat=2.3; now BP ok & Creat=1.6, but BNP was elev necessitation restart of Lasix...  DIASTOLIC CHF>  2DEcho w/ Gr 2 DD & she was diuresed, prev Lasix backed off to 20mg /d & now placed on hold w/ Creat=2.3 & BNP=104; subseq had to restart Lasix when BNP increased (see above)...  SARCOIDOSIS>  Main manifestation has been her hypercalcemia & now back to 11.0 w/ ACE up to 86; rec incr Pred to 10mg /d==> Improved w/ Ca=10.0 & we will wean further...  Hypercalcemia>  Initially responded to splenectomy but her calcium level rose again over several months after the surg; we were forced to use Pred rx in this difficult diabetic; we had her see [name] for DM/ endocrine consult;  calcium levels have responded nicely to Pred Rx but climbed back to 11.0 when Pred weaned to 5mg  Qod; therefore rec incr Pred back to 10mg /d==> as above calcium improved on Pred10 & ok to decr to 05-06-09-5...  DM>  Control has been difficult;  Stressed diet/ exercise, & get wt down; DrA has her on Levemir & ApidraSS coverage...  Renal Insuffic>  Creat was up to 2.4 in [place] & then back to 1.6 but now back to 2.3 w/ Lasix at 20mg /d & BNP=104; rec HOLD LASIX for now...  Other medical problems as noted...   Patient's Medications  New Prescriptions   No medications on file  Previous Medications   ALBUTEROL (PROVENTIL HFA;VENTOLIN HFA) 108 (90 BASE) MCG/ACT INHALER    Inhale 2 puffs into the lungs every 6 (six) hours as needed. For shortness of breath.   ALLOPURINOL (ZYLOPRIM) 300 MG TABLET    Take 300 mg by mouth daily.   AMLODIPINE (NORVASC) 10 MG TABLET    Take 10 mg by mouth daily.   ASPIRIN EC 81 MG TABLET    Take 81 mg by mouth daily.   ATORVASTATIN  (LIPITOR) 20 MG TABLET    Take 20 mg by mouth daily.   CLONIDINE (CATAPRES) 0.1 MG TABLET    Take 0.1 mg by mouth 2 (two) times daily.   DEXTROMETHORPHAN-GUAIFENESIN (TUSSIN DM PO)    Take 1 tablet by mouth daily as needed. For cough.   ESOMEPRAZOLE (NEXIUM) 40 MG CAPSULE    Take 40 mg by mouth 2 (two) times daily.   FERROUS SULFATE 325 (65 FE) MG TABLET    Take 325 mg by mouth daily with breakfast.   FLUTICASONE (FLONASE) 50 MCG/ACT NASAL SPRAY    Place 2 sprays into the nose at bedtime.   FLUTICASONE-SALMETEROL (ADVAIR) 250-50 MCG/DOSE AEPB    Inhale 1 puff into the lungs every 12 (twelve) hours.   GLIMEPIRIDE (AMARYL) 4 MG TABLET    Take 4 mg by mouth daily before breakfast.   GUAIFENESIN (MUCINEX) 600 MG 12 HR TABLET    Take 1,200 mg by mouth 2 (two) times daily.   HYDRALAZINE (APRESOLINE) 10 MG TABLET    Take 10 mg by mouth 3 (three) times daily.   INSULIN DETEMIR (LEVEMIR) 100 UNIT/ML INJECTION    Inject 80 Units into the skin daily. Inject 80 units before breakfast   INSULIN GLULISINE (APIDRA SOLOSTAR) 100 UNIT/ML INJECTION    Take 20 units at breakfast, 34 units at lunch and 30 units at dinner to be Used with  sliding scale as directed   KETOROLAC (ACULAR LS) 0.4 % SOLN    Apply 1 drop to eye 3 (three) times daily.   LORAZEPAM (ATIVAN) 1 MG TABLET    Take 1 mg by mouth daily as needed. For anxiety.   METOPROLOL TARTRATE (LOPRESSOR) 25 MG TABLET    Take 12.5 mg by mouth daily.   POLYETHYLENE GLYCOL (MIRALAX / GLYCOLAX) PACKET    Take 17 g by mouth daily.    PREDNISONE (DELTASONE) 5 MG TABLET    Take 5 mg by mouth daily.    PREGABALIN (LYRICA) 100 MG CAPSULE    Take 100 mg by mouth every evening.   SITAGLIPTIN (JANUVIA) 50 MG TABLET    Take 50 mg by mouth daily.  Modified Medications   No medications on file  Discontinued Medications   CHOLECALCIFEROL (VITAMIN D) 1000 UNITS TABLET    Take 1,000 Units by mouth daily.  HYDRALAZINE (APRESOLINE) 10 MG TABLET    Take 10 mg by mouth 3  (three) times daily.   HYDRALAZINE (APRESOLINE) 10 MG TABLET    TAKE 1 TABLET BY MOUTH 3 TIMES DAILY   LYRICA 100 MG CAPSULE    take 1 capsule by mouth at bedtime

## 2012-03-30 NOTE — ED Provider Notes (Signed)
Medical screening examination/treatment/procedure(s) were conducted as a shared visit with non-physician practitioner(s) and myself.  I personally evaluated the patient during the encounter  Hurman Horn, MD 03/30/12 1146

## 2012-03-30 NOTE — ED Provider Notes (Signed)
Medical screening examination/treatment/procedure(s) were conducted as a shared visit with non-physician practitioner(s) and myself.  I personally evaluated the patient during the encounter  Neri Samek M Tamanika Heiney, MD 03/30/12 1146 

## 2012-03-31 ENCOUNTER — Encounter (HOSPITAL_COMMUNITY): Payer: Self-pay

## 2012-03-31 ENCOUNTER — Emergency Department (HOSPITAL_COMMUNITY): Payer: BC Managed Care – PPO

## 2012-03-31 ENCOUNTER — Emergency Department (HOSPITAL_COMMUNITY)
Admission: EM | Admit: 2012-03-31 | Discharge: 2012-03-31 | Disposition: A | Discharge status: Home / Self Care | Payer: BC Managed Care – PPO | Attending: Emergency Medicine | Admitting: Emergency Medicine

## 2012-03-31 DIAGNOSIS — R51 Headache: Secondary | ICD-10-CM | POA: Insufficient documentation

## 2012-03-31 DIAGNOSIS — J449 Chronic obstructive pulmonary disease, unspecified: Secondary | ICD-10-CM | POA: Insufficient documentation

## 2012-03-31 DIAGNOSIS — Z79899 Other long term (current) drug therapy: Secondary | ICD-10-CM | POA: Insufficient documentation

## 2012-03-31 DIAGNOSIS — J4489 Other specified chronic obstructive pulmonary disease: Secondary | ICD-10-CM | POA: Insufficient documentation

## 2012-03-31 DIAGNOSIS — IMO0002 Reserved for concepts with insufficient information to code with codable children: Secondary | ICD-10-CM | POA: Insufficient documentation

## 2012-03-31 DIAGNOSIS — W108XXA Fall (on) (from) other stairs and steps, initial encounter: Secondary | ICD-10-CM | POA: Insufficient documentation

## 2012-03-31 DIAGNOSIS — S0083XA Contusion of other part of head, initial encounter: Secondary | ICD-10-CM

## 2012-03-31 DIAGNOSIS — S0003XA Contusion of scalp, initial encounter: Secondary | ICD-10-CM | POA: Insufficient documentation

## 2012-03-31 DIAGNOSIS — I10 Essential (primary) hypertension: Secondary | ICD-10-CM | POA: Insufficient documentation

## 2012-03-31 DIAGNOSIS — I2510 Atherosclerotic heart disease of native coronary artery without angina pectoris: Secondary | ICD-10-CM | POA: Insufficient documentation

## 2012-03-31 DIAGNOSIS — M25569 Pain in unspecified knee: Secondary | ICD-10-CM

## 2012-03-31 DIAGNOSIS — E119 Type 2 diabetes mellitus without complications: Secondary | ICD-10-CM | POA: Insufficient documentation

## 2012-03-31 DIAGNOSIS — Z794 Long term (current) use of insulin: Secondary | ICD-10-CM | POA: Insufficient documentation

## 2012-03-31 DIAGNOSIS — G4733 Obstructive sleep apnea (adult) (pediatric): Secondary | ICD-10-CM | POA: Insufficient documentation

## 2012-03-31 MED ORDER — ACETAMINOPHEN 325 MG PO TABS
650.0000 mg | ORAL_TABLET | Freq: Once | ORAL | Status: AC
  Administered 2012-03-31: 650 mg via ORAL
  Filled 2012-03-31: qty 2

## 2012-03-31 NOTE — ED Notes (Signed)
ZOX:[id number]<AV> Expected date:03/31/12<BR> Expected time: 3:41 PM<BR> Means of arrival:Ambulance<BR> Comments:<BR> Fall/elderly

## 2012-03-31 NOTE — ED Provider Notes (Signed)
History     CSN: [id number]  Arrival date & time 03/31/12  1553   First MD Initiated Contact with Patient 03/31/12 1648      Chief Complaint  Patient presents with  . Fall    (Consider location/radiation/quality/duration/timing/severity/associated sxs/prior treatment) HPI The patient presents immediately after a fall.  She notes that she was leaving a store, when she missed a step and fell striking her face against the pavement.  She also struck her left knee.  She has not been ambulatory, but did bear weight on the transferring to her head.  No loss of consciousness no confusion, or disorientation.  She notes the pain is severe, diffuse about her head, with particular sharp pain about her forehead since the event.  No attempts at relief thus far. Past Medical History  Diagnosis Date  . Unspecified essential hypertension   . CAD (coronary artery disease)   . Gastritis   . Asthma   . OSA (obstructive sleep apnea)   . Esophageal reflux   . Obesity, unspecified   . Degenerative joint disease   . Anxiety state, unspecified   . Hypercalcemia   . Anemia   . Other psoriasis   . Diverticulosis   . Sarcoidosis   . Diabetes mellitus   . Kidney disease   . Dyslipidemia   . PNA (pneumonia)     With pleural effusion, requiring thoracentesis  . Pleural effusion   . COPD (chronic obstructive pulmonary disease)     Past Surgical History  Procedure Date  . Cholecystectomy   . Vesicovaginal fistula closure w/  total abdominal hysterectomy   . 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 Tonsillectomy   . Appendectomy   . Abdominal hysterectomy   . Breast reduction surgery 1982  . 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 Breast cancer    . Diabetes Mother     History  Substance Use Topics  . Smoking status: Never Smoker   . Smokeless tobacco: Never Used  . Alcohol Use: No    OB History      Grav Para Term Preterm Abortions TAB SAB Ect Mult Living                  Review of Systems  Constitutional:       HPI  HENT:       HPI otherwise negative  Eyes: Negative.   Respiratory:       Sarcoidosis, with chronic, intermittent, dyspnea  Cardiovascular:       HPI, otherwise nmegative  Gastrointestinal: Negative for vomiting.  Genitourinary:       HPI, otherwise negative  Musculoskeletal:       HPI, otherwise negative  Skin: Negative.   Neurological: Negative for syncope.    Allergies  Adhesive; Avelox; Codeine; Guaifenesin; Latex; and Oxycodone  Home Medications   Current Outpatient Rx  Name Route Sig Dispense Refill  . ALBUTEROL SULFATE HFA 108 (90 BASE) MCG/ACT IN AERS Inhalation Inhale 2 puffs into the lungs every 6 (six) hours as needed. For shortness of breath.    . ALLOPURINOL 300 MG PO TABS Oral Take 300 mg by mouth daily.    Marland Kitchen AMLODIPINE BESYLATE 10 MG PO TABS Oral Take 10 mg by mouth daily.    . ASPIRIN EC 81 MG PO TBEC Oral Take 81 mg by mouth daily.    Marland Kitchen  ATORVASTATIN CALCIUM 20 MG PO TABS Oral Take 20 mg by mouth daily.    Marland Kitchen CLONIDINE HCL 0.1 MG PO TABS Oral Take 0.1 mg by mouth 2 (two) times daily.    Elmo Putt DM PO Oral Take 1 tablet by mouth daily as needed. For cough.    . ESOMEPRAZOLE MAGNESIUM 40 MG PO CPDR Oral Take 40 mg by mouth 2 (two) times daily.    Marland Kitchen FERROUS SULFATE 325 (65 FE) MG PO TABS Oral Take 325 mg by mouth daily with breakfast.    . FLUTICASONE PROPIONATE 50 MCG/ACT NA SUSP Nasal Place 2 sprays into the nose at bedtime. 48 g 3  . FLUTICASONE-SALMETEROL 250-50 MCG/DOSE IN AEPB Inhalation Inhale 1 puff into the lungs every 12 (twelve) hours.    Marland Kitchen GLIMEPIRIDE 4 MG PO TABS Oral Take 4 mg by mouth daily before breakfast.    . GUAIFENESIN ER 600 MG PO TB12 Oral Take 1,200 mg by mouth 2 (two) times daily.    Marland Kitchen HYDRALAZINE HCL 10 MG PO TABS Oral Take 10 mg by mouth 3 (three) times daily.    . INSULIN DETEMIR 100 UNIT/ML [place] SOLN Subcutaneous  Inject 80 Units into the skin daily. Inject 80 units before breakfast    . INSULIN GLULISINE 100 UNIT/ML [place] SOLN  Take 20 units at breakfast, 34 units at lunch and 30 units at dinner to be Used with  sliding scale as directed    . KETOROLAC TROMETHAMINE 0.4 % OP SOLN - NO CHARGE Ophthalmic Apply 1 drop to eye 3 (three) times daily.    Marland Kitchen LORAZEPAM 1 MG PO TABS Oral Take 1 mg by mouth daily as needed. For anxiety.    Marland Kitchen METOPROLOL TARTRATE 25 MG PO TABS Oral Take 12.5 mg by mouth daily.    Marland Kitchen POLYETHYLENE GLYCOL 3350 PO PACK Oral Take 17 g by mouth daily.     Marland Kitchen PREDNISONE 5 MG PO TABS Oral Take 5 mg by mouth daily.     Marland Kitchen PREGABALIN 100 MG PO CAPS Oral Take 100 mg by mouth every evening.    Marland Kitchen SITAGLIPTIN PHOSPHATE 50 MG PO TABS Oral Take 50 mg by mouth daily.      BP 165/60  Pulse 72  Temp 98 F (36.7 C) (Oral)  Resp 16  SpO2 94%  Physical Exam  Constitutional: She is oriented to person, place, and time. She appears well-developed and well-nourished. No distress.  HENT:  Head: Head is with abrasion and with contusion. Head is without right periorbital erythema and without left periorbital erythema. Hair is normal.    Nose: Nose normal.  Mouth/Throat: Uvula is midline, oropharynx is clear and moist and mucous membranes are normal.  Eyes: Conjunctivae and EOM are normal. Pupils are equal, round, and reactive to light.  Neck: No spinous process tenderness and no muscular tenderness present.  Cardiovascular: Normal rate and regular rhythm.   Pulmonary/Chest: Effort normal and breath sounds normal. No stridor.  Abdominal: She exhibits no distension. There is no tenderness.  Musculoskeletal:       Legs: Neurological: She is alert and oriented to person, place, and time. No cranial nerve deficit. She exhibits normal muscle tone. Coordination normal.  Skin: Skin is warm. She is diaphoretic.  Psychiatric: She has a normal mood and affect.    ED Course  Procedures (including critical care  time)  Labs Reviewed - No data to display No results found.   No diagnosis found.  Pulse ox 99% room  air normal  I reviewed all radiographic studies w the patient, and confirmed the absence of L TMJ pain / clicking.  MDM  This patient with multiple medical problems presents following a mechanical fall with a forehead hematoma, headache in the left knee pain.  Exam patient is in no distress, has no notable neurologic deficiencies, but complains of persistent pain.  Radiographic studies were largely reassuring, and the patient deferred mouth for 2 minutes it any of her other medical issues..Acute findings the patient is appropriate for discharge, with close follow up instructions.      Gerhard Munch, MD 03/31/12 Ric[facility] Primus

## 2012-03-31 NOTE — ED Notes (Addendum)
Pt tripped and fell on concrete sidewalk, resulted in right knee abrasion and frontal hematoma, c/o right knee pain and headache. Negative for LOC, N/V, and dizziness. Alert, oriented, ambulatory, no other injuries noted. Do also c/o leg swelling but no swelling noted upon assessment.

## 2012-04-06 ENCOUNTER — Other Ambulatory Visit (INDEPENDENT_AMBULATORY_CARE_PROVIDER_SITE_OTHER): Payer: BC Managed Care – PPO

## 2012-04-06 DIAGNOSIS — I10 Essential (primary) hypertension: Secondary | ICD-10-CM

## 2012-04-06 LAB — BASIC METABOLIC PANEL
BUN: 45 mg/dL — ABNORMAL HIGH (ref 6–23)
Calcium: 9.9 mg/dL (ref 8.4–10.5)
Creatinine, Ser: 2.2 mg/dL — ABNORMAL HIGH (ref 0.4–1.2)
GFR: 29.51 mL/min — ABNORMAL LOW (ref >60.00)
Glucose, Bld: 104 mg/dL — ABNORMAL HIGH (ref 70–99)

## 2012-04-07 ENCOUNTER — Other Ambulatory Visit: Payer: Self-pay | Admitting: Pulmonary Disease

## 2012-04-07 ENCOUNTER — Telehealth: Payer: Self-pay | Admitting: Pulmonary Disease

## 2012-04-07 DIAGNOSIS — D472 Monoclonal gammopathy: Secondary | ICD-10-CM

## 2012-04-07 DIAGNOSIS — D869 Sarcoidosis, unspecified: Secondary | ICD-10-CM

## 2012-04-07 NOTE — Telephone Encounter (Signed)
Called and spoke with pt about her lab results per SN.  Pt voiced her understanding and is aware to stop the lasix 40 x 1 week and then start back on lasxi 20 mg daily.  Pt to return to the lab the week of 9/23 for repeat BNP and bmp.  Pt voiced her understanding of this and nothing further is needed.

## 2012-04-14 ENCOUNTER — Telehealth: Payer: Self-pay | Admitting: Pulmonary Disease

## 2012-04-14 NOTE — Telephone Encounter (Signed)
I spoke with pt and she stated she forgot to restart her lasix on Sunday but it is going to restart this today bc she is having swelling in her feet. Also stated she has a slight dry cough and she still has cough syrup left over from last RX SN had gave her for cough. She wanted to know if she would be fine to take this. I advised her that was fine and she can also take mucinex OTC if she felt like she had some congestion. She stated she did not. She needed nothing further

## 2012-04-26 ENCOUNTER — Other Ambulatory Visit (INDEPENDENT_AMBULATORY_CARE_PROVIDER_SITE_OTHER): Payer: BC Managed Care – PPO

## 2012-04-26 DIAGNOSIS — D869 Sarcoidosis, unspecified: Secondary | ICD-10-CM

## 2012-04-26 DIAGNOSIS — D472 Monoclonal gammopathy: Secondary | ICD-10-CM

## 2012-04-26 LAB — BASIC METABOLIC PANEL
BUN: 40 mg/dL — ABNORMAL HIGH (ref 6–23)
Calcium: 9.1 mg/dL (ref 8.4–10.5)
Creatinine, Ser: 2.1 mg/dL — ABNORMAL HIGH (ref 0.4–1.2)
GFR: 30.99 mL/min — ABNORMAL LOW (ref >60.00)

## 2012-04-26 LAB — BRAIN NATRIURETIC PEPTIDE: Pro B Natriuretic peptide (BNP): 196 pg/mL — ABNORMAL HIGH (ref 0.0–100.0)

## 2012-04-28 ENCOUNTER — Ambulatory Visit (INDEPENDENT_AMBULATORY_CARE_PROVIDER_SITE_OTHER): Payer: BC Managed Care – PPO | Admitting: Pulmonary Disease

## 2012-04-28 ENCOUNTER — Encounter: Payer: Self-pay | Admitting: Pulmonary Disease

## 2012-04-28 VITALS — BP 122/74 | HR 80 | Temp 98.0°F | Ht 62.5 in | Wt 220.0 lb

## 2012-04-28 DIAGNOSIS — I2510 Atherosclerotic heart disease of native coronary artery without angina pectoris: Secondary | ICD-10-CM

## 2012-04-28 DIAGNOSIS — N259 Disorder resulting from impaired renal tubular function, unspecified: Secondary | ICD-10-CM

## 2012-04-28 DIAGNOSIS — I10 Essential (primary) hypertension: Secondary | ICD-10-CM

## 2012-04-28 DIAGNOSIS — E119 Type 2 diabetes mellitus without complications: Secondary | ICD-10-CM

## 2012-04-28 DIAGNOSIS — M549 Dorsalgia, unspecified: Secondary | ICD-10-CM | POA: Insufficient documentation

## 2012-04-28 DIAGNOSIS — I5032 Chronic diastolic (congestive) heart failure: Secondary | ICD-10-CM

## 2012-04-28 DIAGNOSIS — G8929 Other chronic pain: Secondary | ICD-10-CM

## 2012-04-28 DIAGNOSIS — E669 Obesity, unspecified: Secondary | ICD-10-CM

## 2012-04-28 DIAGNOSIS — D869 Sarcoidosis, unspecified: Secondary | ICD-10-CM

## 2012-04-28 DIAGNOSIS — M1990 Unspecified osteoarthritis, unspecified site: Secondary | ICD-10-CM

## 2012-04-28 DIAGNOSIS — J45909 Unspecified asthma, uncomplicated: Secondary | ICD-10-CM

## 2012-04-28 DIAGNOSIS — E7800 Pure hypercholesterolemia, unspecified: Secondary | ICD-10-CM

## 2012-04-28 NOTE — Progress Notes (Signed)
Subjective:    Patient ID: [name], female    DOB: 05/01/1946, 66 y.o.   MRN: [id number]  HPI 66 y/o BF here for a follow up visit... he has multiple medical problems as noted below...    ~  November 24, 2010:  [name], no evid malignancy;  Post-op her calcium level returned to normal w/o further intervention;  She had several post-op complications including a subphrenic abscess that required percutaneous drainage, and a left pleural effusion/ basilar atelectasis (s/p thoracentesis & improved)... She has improved slowly at home w/ family help & is planning return to work 11/30/10;  She notes sl cough w/ sm amt thick beige sput, & SOB/ weakness is gradually better;  Denies CP etc, notes insomnia & anxiety...  CXR today w/ improved left base, no clear baseline film;  Labs show Hg=11.6, BS=117, Creat=1.6, FLP looks good, LFTs are OK x sl incr AlkPhos, but note Ca=11.1 (was 8.6 one month ago)...  ~  March 01, 2011:  37mo ROV & she presents w/ recent cough, congestion, green drainage, and tired/ SOB/ etc; she notes incr arthritis pain, aching/ sore and some constipation;  We discussed checking f/u CXR (clear, NAD); and Labs- CBC-ok, BS=167, A1c=8.4, BUN=44, Creat=1.9, Ca=12.0, Phos=4.0, AlkPhos=167, GGT=151, ACE=78;  We discussed Rx w/ Avelox, Mucinex, Fluids; and incr Miralax/ Senakot-S/ MOM for the constipation...    Recurrent hypercalcemia most likely from active Sarcoidosis> labs w/ incr AlkPhos & GGT> we will image the liver at this time to compare to prev; and consult w/ Endocrinology for their opinion regarding the hypercalcemia AND her IDDM (esp in view of poss Prednisone rx for the calcium & sarcoid)...    Renal Insuffic followed by DrFox for Nephrology> Creat up to 1.9 from 1.6 last, needs incr fluid intake, she knows to avoid nephrotoxic agents/ meds/ etc...    She has known MGUS followed by  DrGranfortuna but prev bone marrow was neg etc; due for MGUS labs this fall, current CBC is stable: Hg=12.8, Fe=45...  ~  May 27, 2011:  37mo ROV & she has established w/ DrAltheimer for Endocrine on Levemir & Apidra, along w/ Glimepiride & Januvia; we called in Prednisone 20mg /d in early Oct to start rx for her high calcium levels & labs from DrA's office reviewed >> Calcium has improved to 10.3 on 3wks of oral Pred...    Sarcoidosis w/ HYPERCALCEMIA> Ca++ was 12.4 -13.4 in Aug & Sept; since starting Pred20mg /d in early Oct her Ca++ has improved to 10.3 now; we decided to slowly taper the Pred in light of her IDDM & improved Ca++ level (try 20mg , alt w/ 10mg  Qod)...    HBP> on Norvasc & Diovan; BP= 132/80 & she denies CP, palpit, dizzy, ch in SOB/ DOE, edema, etc...    CHOL> on Lip20; FLP 4/12 looked good & we reviewed low fat diet restrictions...    DM on Insulin> as above, followed by DrA on Levemir30 & ApidraSS coverage at meals; BS at home varies 150-400+, FBS today=80, latest A1c was 7.6 in Aug...    Obesity> wt= 180# (63"Tall & BMI=32) which is up 8# and we reviewed diet, exercise, & wt reduction strategies...    GI> GERD on NexiumBid; and we reviewed antireflux regimen (elev HOB, npo after dinner, etc)...    Renal Insuffic> Creat had incr to 2.3 when Ca++ rose to 13.4, but has returned to 1.8 now; advised incr  free water intake...    Anxiety> on Alprazolam prn; she's been under extra stress w/ loss of job, this illness, family issues...  ~  June 28, 2011:  51mo ROV> on Pred 20-10 Qod for the last month and BMet today shows Ca++=9.8;  She is anxious to wean the Pred as quickly as poss due to incr appetite, weight gain, difficulty controlling her DM, etc;  She has been seeing DrAltheimer every 3-4wks w/ adjustments in her insulin> currently on Levemir 46u qd; Apidra 10+SS Tid at meals, Amaryl & Januvia;  BS's vary tremendously & she is struggling w/ diet (weight up 8# in the last month to  188# today);  We decided to cut the Pred to 10mg /d now and then further to 10-5 qod after 3wks w/ ROV in 6 weeks...  ~  August 16, 2011:  6wk ROV & post hosp check> she was Adm [facility] 1[facility] - 08/12/11 w/ dyspnea (DOE, orthopnea, edema) thought to be due to diastolic heart failure (2DEcho w/ Gr2DD, BNP=2200), she was diuresed & improved, disch on LASIX40mg - 1/2 tab each am & her prev Diovan was stopped (due to renal insuffic w/ Creat=2.4)...  Her serum Calcium levels were all norm 9.6-9.8;  A1c=9.3;  Hg=10.4>> See below...    Feeling better since disch;  Weight 196# is still 8# heavier than her last visit w/ me 11/12;  We decided to decr her Pred from 10-5 Qod to 5mg /d & keep Lasix at 20mg /d... LABS 1/13:  BUN=33, Creat=1.7, BNP=100;  Ca=9.7;  Hg=12.9;  BS=69 & she will f/u w/ DrAltheimer...  ~  October 06, 2011:  55mo ROV on Pred 5mg /d & generally stable but w/ mult minor somatic complaints- sl SOB, some cough, thick white mucous, right arm discomfort, & reflux symptoms;  She had a ZPak called in about 2wks ago;  She is on a good antireflux regimen, plus Flonase & Saline;  We will add Tussionex for prn use;  Unfortunately weight is up 9# further tp 205# today;  BP remains controlled on 5 MEDS (see below);  She continues to f/u w/ DrAltheimer for DM> on Levimir 46u & Apidra 03-15-13;  We decided to cut the Pred to 5mg  QOD & keep the Lasix 20mg /d... LABS 3/13:  BS=135,  BUN=38, Creat=1.6;  Ca=10.1;  ACE=48;  BNP=128...  ~  January 06, 2012:  64mo ROV & Takeira has been on Pred5mg Qod for the last 64mo; she has noted some joint pains & has rash on her arms- seeing derm w/ hx Psoriasis but she feels their topical rx isn't working; she also notes sl cough, some greenish sput production, chest feels tight, & some right sided chest discomfort- she is requesting a ZPak which seems to work for her...     She saw DrAlt 4/13> Ave BS from her machine 249 & A1c=9.1, insulin adjusted; FLP looked good on Lip20; extensive note  reviewed...    [name], meds, xrays and labs> see below>> LABS 6/13:  Chems- BS=169 BUN=41 Creat=2.3 BNP=104 Ca=11.0 ACE=86;  CBC- ok w/ Hg=12.4 REC> Stop Lasix20 for now, increase Pred to 10mg /d, ROV 6wks w/ BMet...  ~  March 30, 2012:  64mo ROV & post-ER visit> she's been to the ER twice in the last 55mo w/ SOB, cough, congestion, etc; on Pred, Advair, Mucinex, Tussionex, & given antibiotics, Neb Rx, etc & improved;  Also rec to restart     She saw DrWall for Cards f/u 7/13> known CAD, DOE, good BP control &  2DEcho showed good LVF & no evid for pulmHTN; continue same meds, & Lasix...    She had f/u DrAltheimer 6/13> DM, renal insuffic, hyperlipidemia, hypercalcemia, etc; on Levemir80, Apidra20-34-30, [id number] & Amaryl4; BS=118 A1c=8.8.Marland KitchenMarland Kitchen [name], meds, xrays and labs> see below for updates >> CXR 8/13 showed cardiomeg, ?mild interstitial edema, mild DJD TSpine... EKG 8/13 showed NSR, rate93, min NSSTTWA, NAD... LABS 8/13:  CBC- Hg=11.9 WBC=18K;  Chems- BUN=30 Creat=1.6 Ca=10.0 BNP=2960 ==> started back on Lasix40/d...  ~  April 28, 2012:  84mo ROV & last visit we decreased the Pred to 5mg /d (on this for hypercalcemia from Sarcoid) & decreased the Lasix to 20mg /d (due to RI w/ Cr=2.2);  Follow up labs today showed Ca=9.1 and we decided to decr the Pred further to 5mg  Qod;  In addition her BUN=40, Creat=2.1, BNP=196 (ok to continue Lasix20); she understands that if her Creat starts to rise further that we will have to send her to Nephrology...    She saw DrAltheimer for f/u 9/13> note reviewed & he adjusted her insulin regimen- Levemir 80uQam & Apidra 20-34-30 + SS adjustments...    She fell 8/30 & hit her head> went to ER & DrLockwood's note is reviewed- c/o headache & left knee pain; Exam showed forehead hematoma & CTBrain showed left frontal scalp hematoma, no fx, mild cortical atrophy, mild carotid atherosclerosis, NAD;  Maxillofacial CT was neg w/o facial bone fx;   Left knee prosthesis & no acute changes...    She reports that she just had an ESI injection from DrRamos for LBP & she is improved...    [name], meds, xrays and labs> see below for updates >> LABS 9/13:  Chems- BS=234, Ca=9.1, BUN=40, Creat=2.1, BNP=196          Problem List:    OBSTRUCTIVE SLEEP APNEA - Sleep Study 11/07 showed RDI=21 w/ desat to 62% during REM.Marland Kitchen. eval by DrSood w/ Rx for CPAP 12... compliance poor- only using it 1-2 times/wk... encouraged to use CPAP more regularly & to f/u w/ DrSood regarding the mask interface problems... ~  8/11:  she reports new mask but still not using CPAP regularly, "I rest fairly well". ~  7/12:  She reports not using her CPAP, and not resting well... ~  1/13:  CPAP used briefly during [facility] for diastolic CHF, but still not compliant w/ home use...  BRONCHITIS, ACUTE - on ADVAIR 250Bid (using Prn only) & PROAIR as needed. 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 ~  CXR 1/09 was pre-op for TKR- sl cardomeg & ?mild vasc congestion. ~  CXR 2/11 showed norm heart size & vascularity, clear, s/p cholecystectomy, DJD in TSpine. ~  CXR & CT Chest 11/11 showed sl peribronch thickening, no lung lesions, no signif adenopathy. ~  PET scan 1/12 showed hypermetabolic activ in spleen & lymph nodes in the supraclav, hilar, mediastinal, periaotic & iliac region as well. ~  CXRs 3/12 in the perioperative period after splenectomy showed left effusion==> tapped & improved aeration. ~  CXR 4/12 back to baseline w/ clear bases, essent wnl... ~  CXR 7/12 showed norm heart size, clear lungs, no definite adenopathy, NAD... ~  LABS 7/12 w/ ACE=78 (8-52), Ca=12.0, AlkPhos=176 (39-117), GGT=151 (7-51) ~  Labs from DrAltheimer> 8/12: Ca=12.4, Creat=1.8; then 9/12: Ca=13.4, Creat=2.3;  ~  10/12:  PREDNISONE 20mg /d started 10/12 & 3wks later Ca=10.3, Creat=1.8; therefore weaned to 20-10 Qod. ~  11/12: Labs on  Pred  20-10 Qod showed Ca++= 9.8; we will continue to wean Pred. ~  1/13:  CXR showed cardiomeg, mild interstitial edema, DJD spine; Labs showed Ca=9.7 & we decided to wean Pred to 5mg /d... ~  3/13:  Labs showed Ca=10.1 on Pred 5mg /d; decided to wean to 5mg Qod... ~  6/13:  Labs showed Ca=11.0, ACE=86; on Pred5Qod & rec to incr back to 10mg /d... ~  8/13:  CXR showed cardiomeg, ?mild interstitial edema, mild DJD TSpine... ~  Labs 8/13 showed Ca=10.0 on Pred10/d; rec to decr further to 5mg /d... ~  Labs 9/13 showed Ca= 9.1 on Pred5mg /d & she will try to decr to 5Qod again...  HYPERTENSION - taking METOPROLOL 25mg - 1/2 daily, NORVASC 10mg /d, APRESOLINE 10mg Tid, CLONIDINE 0.1mg Bid, LASIX 20mg - see below; off prev [id number] due to azotemia w/ diuresis. ~  4/12:  BP here 132/62 and she denies HA, visual changes, CP, palipit, dizziness, syncope, change in dyspnea, etc... ~  10/12:  BP= 132/80 & she denies CP, palpit, dizzy, ch in SOB/ DOE, edema, etc... ~  11/12:  BP= 132/82 & she is relatively asymptomatic other than DOE from her weight and some musc cramps... ~  1/13: Post Hosp BP= 146/70 on ? - Metop12.5, Amlod10, Clonid0.1Bid, Lasix20; she denies CP, palpit, SOB, edema> improved from recent hosp... ~  3/13: She called in the interim for "refill" Apresoline not prev on any med list; BP= 150/80 on all 5 meds; must elim sodium & get wt down... ~  6/13:  BP= 142/80 on but BUN-41 Creat=2.3 BNP=104; rec to stop Lasix for now, f/u BMet 6wks. ~  8/13:  BP= 140/70 on same meds & BUN=30, Creat=1.6, but BNP incr to 2960 & LASIX 40mg /d restarted... ~  9/13:  Follow up labs showed BUN=45, Creat=2.2 on Lasix40 and we rec decr to Lasix20... ~  9/13:  BP= 122/74 on w/ BUN=40, Creat=2.1, & BNP=196... Continue same.  CAD - on ASA 81mg /d...  ~  Hx non-obstructive CAD w/ cath 6/07 by DrStuckey showing luminal irregularities & tiny first marginal branch of the CIRC w/ ostial lesion... good LVF. ~  2DEcho  9/07 showed mildly calcif AoV and normal LVF & wall motion. ~  2DEcho 11/11 showed norm LV wall thickness, norm LVF w/ EF= 60-65%, norm atria, norm valves, trivial peric fluid behind heart. ~  2DEcho 1/13 showed mild LVH, norm LVF w/ EF=55-60% & no regional wall motion abn, Gr2DD, norm RV...  CEREBROVASCULAR DISEASE - she remains on ASA 81mg /d without TIA's or other neuro manifestations...  ~  MRA Br 9/07 showed mod intracranial atherosclerotic changes...   HYPERLIPIDEMIA - on LIPITOR 20mg /d... FLP monitored at each OV by DrAltheimer... ~  FLP 7/07 showed TChol 114, TG 85, HDL 28, LDL 69 ~  FLP 4/09 showed TChol 154, TG 192, HDL 34, LDL 82... discussed poss of adding fibrate- hold... ~  FLP 2/10 on Lip20 showed TChol 159, TG 207, HDL 29, LDL 83... rec> add Fenoglide (she didn't). ~  FLP 2/11 on Lip20 showed TChol 185, TG 242, HDL 43, LDL 99... add FENOFIBRATE 160mg /d... ~  FLP 8/11 on Lip20+Feno160 showed TChol 167, TG 216, HDL 37, LDL 96... she stopped Feno160. ~  FLP 4/12 on Lip20 showed TChol 146, TG 115, HDL 35, LDL 88 ~  FLP 2/13 on Lip20 showed TChol 159, TG 176, HDL 41, LDL 86  DM - on LEVEMIR & APIDRA SS coverage now per DrAltheimer; plus GLIMEPIRIDE 4mg /d and JANUVIA 50mg /d. ~  labs 4/08 showed BS=138 & HgA1c=7.5.Marland KitchenMarland Kitchen  home BS = 100 to >300... misses doses and not on diet or exercising... ~  1/09 became hypoglycemic in hosp on 4 meds post knee surg- meds adjusted... ~  3/09 restarted GLIMEPIRIDE 4mg - 1/2 tab each AM, & continue Lantus & Metformin... ~  labs 4/09 showed BS= 148, HgA1c= 7.3.Marland Kitchen. rec> back on 4 med regimen due to poor control at home. ~  6/09 discussed titrating the Lantus up until FBS 100-120 range... ~  labs 2/10 showed BS= 164, HgA1c= 8.6.Marland KitchenMarland Kitchen very disappointing- needs home BS monitoring, incr Lantus. ~  labs 2/11 showed BS= 298, A1c= 8.4...  discussed change Lantus to LEVEMIR 40 u daily... ~  labs 8/11 showed BS= 202, A1c= 10.9.Marland Kitchen. rec> incr Levemir to 50, consider  Humalog. ~  Nov-Dec 2011:  BS up w/ adjust of meds & addition of Pred after hosp 11/11 & hypercalcemia... ~  She states she returned to Levemir 40u daily after the splenectomy hosp due to appetite etc;  4/12 BS=117 ~  Labs 7/12 showed BS= 167, A1c= 8.4> on Levemir 50u/d & Amaryl 4mg  Qam (note creat 1.9). ~  8-10/12: on Levemir30 + ApidraSS per DrA & BS are 150-400+ due to the Pred20; A1c 8/12 was 7.6 ~  11/12:  DrA has increased her Levemir to 46u/d & the Apidra to 10+SS cover Tid... BS here= 168 ~  1/13: Covered w/ SSI in Hosp but A1c=9.3 (Creat up to 2.4 but improved to 1.7 w/ adjust meds)... ~  DrAltheimer's notes indicates continued adjustments in her insulin regimen w/ Levemir & Apidra...  OBESITY (ICD-278.00) - weight down to 171 after hosp 11/11- doing better on diet, not yet exercising. ~  weight 220-230# in the early 1990's... ~  weight 210-220# in the early 2000's... ~  weight 2/10 = 192# ~  weight 2/11 = 186# ~  weight 8/11 = 174# ~  weight 12/11 = 171# ~  Weight 4/12 = 168# ~  Weight 7/12 = 171# ~  Weight 10/12 = 180# ~  Weight 11/12 = 188# ~  Weight 1/13 = 196# ~  Weight 3/13 = 205#... She MUST get back on diet, incr exercise, get wt down. ~  Weight 6/13 = 205# ~  Weight 8/13 = 210# ~  Weight 9/13 = 220#... What happened?  HYPERCALCEMIA> this was most likely due to sarcoidosis w/ extensive splenic involvement;  Calcium was as hight as 13.7 and returned to normal after the splenectomy. ~  Labs 4/12 showed calcium = 11.1 & this is very disappointing... ~  Labs 7/12 showed Ca= 12.0, Phos= 4.0, ACE= 78 ~  Labs 8-10/12 showed Ca up to 13.4, then down to 10.3 on PRED20mg /d... ~  Labs 11/12 showed Ca= 9.8 on Pred 20-10 qod; we will continue to wean. ~  Labs 1/13 in [place] showed Ca= 9.7 range on Pred 10-5 Qod;  We decided to wean Pred to 5mg /d... ~  Labs 3/13 showed Ca=10.1 on Pred5mg /d; rec to decr to 5mg Qod... ~  Labs 6/13 showed Ca=11.0 on Pred5Qod; rec incr to 10mg /d... ~   Labs 8/13 showed Ca=10.0 on Pred10/d; rec to decr to 5mg /d... ~  Labs 9/13 showed Ca=9.1 on Pred5mg /d; rec to decr to 5mg Qod...  GERD - on NEXIUM 40mg  Bid... w/ increased reflux symptoms w/ noct cough etc...  ~  2/10: discussed optimal Rx w/ Nex before dinner, Zantac300 + Reglan10 at bed, elev HOB, etc. ~  2/11: she stopped the Reglan, still using Nexium/ Zantac but w/ persist symptoms>  refer to  GI for eval. ~  6/11: GI f/u DrPerry w/ EGD that was normal... continue Rx. ~  12/11:  increased GI symptoms post hosp> she will f/u w/ DrPerry for his input> incr Nexium Bid.  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 ~  6/11:  f/u colonoscopy by DrPerry showed divertics, 4 polyps, AVM.Marland KitchenMarland Kitchen path= tubular adenoma, f/u 32yrs.  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 ~  Note: Serum Calcium ret to normal immediately after spleen removed...  RENAL INSUFFICIENCY (ICD-588.9) >> Creat ~ 2.0 & eval 10/11 by DrFox- prob due to DM & HBP... ~  Labs 4/12 showed BUN= 27, Creat= 1.6 ~  Labs 7/12 showed BUN= 44, Creat= 1.9 (not on diuretics/ NSAIDs/ etc)... ~  Labs showed Creat up to 2.3 when Ca=13.4; now improved to 1.8 w/ Ca coming down... ~  Labs 11/12 showed BUN=38, Creat=1.7; rec to maintain hydration... ~  Labs 1/13 in [place] w/ diuresis showed Creat incr to 2.4 but ret to 1.7 w/ adjustment in meds... ~  Labs 3/13 showed BUN=38, Creat= 1.6 ~  Labs 6/13 showed BUN=41, Creat= 2.3; rec> stop Lasix20 for now, liberalize fluids. ~  Labs 9/13 showed BUN=40, Creat=2.1 on Lasix20, rec to continue the same.  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 on LYRICA 100mg  Qhs, off prev Tramadol Rx... ~  2010 eval by DrHiatt  @ [facility] on Tramadol 50mg , Lyrica 75mg Bid...  GOUT, UNSPECIFIED (ICD-274.9) - Uric in the 7-8 range...on ALLOPURINOL 300mg /d for prevention...  ANXIETY (ICD-300.00) - she is under mod stress- work, mother died, hospice counselling, etc... ~  8/11:  rec starting Alprazolam 0.5mg  Tid for palpit, SOB, anxiety...  ANEMIA-NOS & MGUS >> see eval 10-11/11 by DrGranfortuna w/ bone marrow 11/11 in hosp= neg. ~  Labs 4/12 showed Hg= 11.6, MCV= 84... On FeSO4 daily. ~  Labs 7/12 showed Hg= 12.8, MCV= 90, Fe= 45 (14%sat)... ~  Labs 1/13 in [place] showed Hg=10.4 but improved to 12.9 on post hosp check in office... ~  Labs 6/13 showed Hg= 12.4  PSORIASIS - eval and Rx per DrTafeen prev on Humira injections- stopped 9/11...   Past Surgical History  Procedure Date  . Cholecystectomy   . Vesicovaginal fistula closure w/  total abdominal hysterectomy   . 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 Tonsillectomy   . Appendectomy   . Abdominal hysterectomy   . Breast reduction surgery 1982  . Open splenectomy [id number]    by DrRosenbower    Outpatient Encounter Prescriptions as of 04/28/2012  Medication Sig Dispense Refill  . albuterol (PROVENTIL HFA;VENTOLIN HFA) 108 (90 BASE) MCG/ACT inhaler Inhale 2 puffs into the lungs every 6 (six) hours as needed. For shortness of breath.      . allopurinol (ZYLOPRIM) 300 MG tablet Take 300 mg by mouth daily.      Marland Kitchen amLODipine (NORVASC) 10 MG tablet Take 10 mg by mouth daily.      Marland Kitchen aspirin EC 81 MG tablet Take 81 mg by mouth daily.      Marland Kitchen atorvastatin (LIPITOR) 20 MG tablet Take 20 mg by mouth daily.      . Cholecalciferol (VITAMIN D-3) 1000 UNITS CAPS Take 1 capsule by mouth daily.      Marland Kitchen  cloNIDine (CATAPRES) 0.1 MG tablet Take 0.1 mg by mouth 2 (two) times daily.      Marland Kitchen Dextromethorphan-Guaifenesin (TUSSIN DM PO) Take 1 tablet by mouth daily as needed. For cough.      . esomeprazole (NEXIUM) 40 MG capsule Take 40 mg by  mouth 2 (two) times daily.      . ferrous sulfate 325 (65 FE) MG tablet Take 325 mg by mouth daily with breakfast.      . fluticasone (FLONASE) 50 MCG/ACT nasal spray Place 2 sprays into the nose at bedtime.  48 g  3  . Fluticasone-Salmeterol (ADVAIR) 250-50 MCG/DOSE AEPB Inhale 1 puff into the lungs every 12 (twelve) hours.      . furosemide (LASIX) 20 MG tablet Take 20 mg by mouth daily.      Marland Kitchen glimepiride (AMARYL) 4 MG tablet Take 4 mg by mouth daily before breakfast.      . guaiFENesin (MUCINEX) 600 MG 12 hr tablet Take 1,200 mg by mouth 2 (two) times daily.      . hydrALAZINE (APRESOLINE) 10 MG tablet Take 10 mg by mouth 3 (three) times daily.      . insulin detemir (LEVEMIR) 100 UNIT/ML injection Inject 80 units before breakfast and 20 units before supper      . insulin glulisine (APIDRA SOLOSTAR) 100 UNIT/ML injection to be Used with  sliding scale as directed      . ketorolac (ACULAR LS) 0.4 % SOLN Apply 1 drop to eye 3 (three) times daily.      Marland Kitchen LORazepam (ATIVAN) 1 MG tablet Take 1 mg by mouth daily as needed. For anxiety.      . metoprolol tartrate (LOPRESSOR) 25 MG tablet Take 12.5 mg by mouth daily.      . polyethylene glycol (MIRALAX / GLYCOLAX) packet Take 17 g by mouth daily.       . predniSONE (DELTASONE) 5 MG tablet Take 5 mg by mouth daily.       . pregabalin (LYRICA) 100 MG capsule Take 100 mg by mouth every evening.      . sitaGLIPtin (JANUVIA) 50 MG tablet Take 50 mg by mouth daily.        Allergies  Allergen Reactions  . Adhesive (Tape) Other (See Comments)    Blisters   . Avelox (Moxifloxacin Hcl In Nacl)     GI upset  . Codeine Other (See Comments)    "crazy"   . Guaifenesin Nausea And Vomiting  . Latex Swelling  . Oxycodone Nausea And Vomiting    Current Medications, Allergies, Past Medical History, Past Surgical History, Family History, and Social History were reviewed in [employer] record.   Review of Systems         See HPI  - all other systems neg except as noted... The patient complains of dyspnea on exertion.  The patient denies anorexia, fever, weight loss, weight gain, vision loss, decreased hearing, hoarseness, chest pain, syncope, peripheral edema, prolonged cough, headaches, hemoptysis, abdominal pain, melena, hematochezia, severe indigestion/heartburn, hematuria, incontinence, muscle weakness, suspicious skin lesions, transient blindness, difficulty walking, depression, unusual weight change, abnormal bleeding, enlarged lymph nodes, and angioedema.     Objective:   Physical Exam      WD, Overweight, 66 y/o BF in NAD...  GENERAL:  Alert & oriented; pleasant & cooperative... HEENT:  [place]/AT, EOM-wnl, PERRLA, EACs-clear, TMs-wnl, NOSE-clear, THROAT- clear & wnl... NECK:  Supple w/ fair ROM; no JVD; normal carotid impulses w/o bruits; palp thyroid,  w/o nodules felt; no lymphadenopathy. CHEST:  Clear to P & A; without wheezes/ rales/ or rhonchi. HEART:  Regular Rhythm; gr 1/6 SEM, without rubs/ or gallops. ABDOMEN:  Scar from splenectomy surg; obese, soft, & nontender w/ panniculus; normal bowel sounds; no organomegaly or masses detected EXT: without deformities, mod arthritic changes, s/p bilat TKRs; no varicose veins/ +venous insuffic/ tr edema. NEURO:  CN's intact; no focal neuro deficits... DERM:  Psoriasis rash per DrTafeen...  RADIOLOGY DATA:  Reviewed in the EPIC EMR & discussed w/ the patient...  LABORATORY DATA:  Reviewed in the EPIC EMR & discussed w/ the patient...   Assessment & Plan:    HBP>  we prev stopped Lasix due to incr Creat=2.3; then BP ok & Creat improved to1.6, but BNP was elev necessitation restart of Lasix==> now on 20mg /d w/ Cr=2.1  DIASTOLIC CHF>  2DEcho w/ Gr 2 DD & she was diuresed, prev Lasix backed off to 20mg /d & then placed on hold w/ Creat=2.3 & BNP=104; subseq had to restart Lasix when BNP increased (see above)...  SARCOIDOSIS>  Main manifestation has been her  hypercalcemia & back to 11.0 w/ ACE up to 86 ([id number]); rec incr Pred to 10mg /d==> Improved w/ Ca=10.0 & then weaned further w/ Ca=9.1 on Pred 5mg /d... We will try to wean to Qod again...  Hypercalcemia>  Initially responded to splenectomy but her calcium level rose again over several months after the surg; we were forced to use Pred rx in this difficult diabetic; we had her see DrAltheimer for DM/ endocrine consult; calcium levels have responded nicely to Pred Rx but climbed back to 11.0 when Pred weaned to 5mg  Qod; therefore rec incr Pred back to 10mg /d==> as above calcium improved on Pred & we have slowly weaned the Pred further...  DM>  Control has been difficult;  Stressed diet/ exercise, & get wt down; DrA has her on Levemir & ApidraSS coverage...  Renal Insuffic>  Creat was up to 2.4 in [place] & then back to 1.6 but now back to 2.3 w/ Lasix at 20mg /d & BNP=104; after that we held the Lasix, then restarted 20mg  when the BNP jumped...  Other medical problems as noted...   Patient's Medications  New Prescriptions   No medications on file  Previous Medications   ALBUTEROL (PROVENTIL HFA;VENTOLIN HFA) 108 (90 BASE) MCG/ACT INHALER    Inhale 2 puffs into the lungs every 6 (six) hours as needed. For shortness of breath.   ALLOPURINOL (ZYLOPRIM) 300 MG TABLET    Take 300 mg by mouth daily.   AMLODIPINE (NORVASC) 10 MG TABLET    Take 10 mg by mouth daily.   ASPIRIN EC 81 MG TABLET    Take 81 mg by mouth daily.   ATORVASTATIN (LIPITOR) 20 MG TABLET    Take 20 mg by mouth daily.   CHOLECALCIFEROL (VITAMIN D-3) 1000 UNITS CAPS    Take 1 capsule by mouth daily.   CLONIDINE (CATAPRES) 0.1 MG TABLET    Take 0.1 mg by mouth 2 (two) times daily.   DEXTROMETHORPHAN-GUAIFENESIN (TUSSIN DM PO)    Take 1 tablet by mouth daily as needed. For cough.   ESOMEPRAZOLE (NEXIUM) 40 MG CAPSULE    Take 40 mg by mouth 2 (two) times daily.   FERROUS SULFATE 325 (65 FE) MG TABLET    Take 325 mg by mouth daily with  breakfast.   FLUTICASONE (FLONASE) 50 MCG/ACT NASAL SPRAY    Place 2 sprays into the nose at bedtime.  FLUTICASONE-SALMETEROL (ADVAIR) 250-50 MCG/DOSE AEPB    Inhale 1 puff into the lungs every 12 (twelve) hours.   FUROSEMIDE (LASIX) 20 MG TABLET    Take 20 mg by mouth daily.   GLIMEPIRIDE (AMARYL) 4 MG TABLET    Take 4 mg by mouth daily before breakfast.   GUAIFENESIN (MUCINEX) 600 MG 12 HR TABLET    Take 1,200 mg by mouth 2 (two) times daily.   HYDRALAZINE (APRESOLINE) 10 MG TABLET    Take 10 mg by mouth 3 (three) times daily.   INSULIN DETEMIR (LEVEMIR) 100 UNIT/ML INJECTION    Inject 80 units before breakfast and 20 units before supper   INSULIN GLULISINE (APIDRA SOLOSTAR) 100 UNIT/ML INJECTION    to be Used with  sliding scale as directed   KETOROLAC (ACULAR LS) 0.4 % SOLN    Apply 1 drop to eye 3 (three) times daily.   LORAZEPAM (ATIVAN) 1 MG TABLET    Take 1 mg by mouth daily as needed. For anxiety.   METOPROLOL TARTRATE (LOPRESSOR) 25 MG TABLET    Take 12.5 mg by mouth daily.   POLYETHYLENE GLYCOL (MIRALAX / GLYCOLAX) PACKET    Take 17 g by mouth daily.    PREDNISONE (DELTASONE) 5 MG TABLET    Take 5 mg by mouth daily.    PREGABALIN (LYRICA) 100 MG CAPSULE    Take 100 mg by mouth every evening.   SITAGLIPTIN (JANUVIA) 50 MG TABLET    Take 50 mg by mouth daily.  Modified Medications   No medications on file  Discontinued Medications   No medications on file

## 2012-04-28 NOTE — Patient Instructions (Addendum)
Today we updated your med list in our EPIC system...    Continue your current medications the same...  We decided to decrease the Prednisone to 5mg  every other day...  Continue the Lasix at 20mg  every day...  Remember > NO SALT, drink plenty of water, etc...  Call for any problems...  Let's plan a follow up visit in 2 months w/ blood work prior.Marland KitchenMarland Kitchen

## 2012-04-30 ENCOUNTER — Emergency Department (HOSPITAL_COMMUNITY)
Admission: EM | Admit: 2012-04-30 | Discharge: 2012-04-30 | Disposition: A | Discharge status: Home / Self Care | Payer: BC Managed Care – PPO | Attending: Emergency Medicine | Admitting: Emergency Medicine

## 2012-04-30 ENCOUNTER — Emergency Department (HOSPITAL_COMMUNITY): Payer: BC Managed Care – PPO

## 2012-04-30 ENCOUNTER — Encounter (HOSPITAL_COMMUNITY): Payer: Self-pay | Admitting: Emergency Medicine

## 2012-04-30 DIAGNOSIS — S46909A Unspecified injury of unspecified muscle, fascia and tendon at shoulder and upper arm level, unspecified arm, initial encounter: Secondary | ICD-10-CM | POA: Insufficient documentation

## 2012-04-30 DIAGNOSIS — Z885 Allergy status to narcotic agent status: Secondary | ICD-10-CM | POA: Insufficient documentation

## 2012-04-30 DIAGNOSIS — Z888 Allergy status to other drugs, medicaments and biological substances status: Secondary | ICD-10-CM | POA: Insufficient documentation

## 2012-04-30 DIAGNOSIS — S7002XA Contusion of left hip, initial encounter: Secondary | ICD-10-CM

## 2012-04-30 DIAGNOSIS — S40011A Contusion of right shoulder, initial encounter: Secondary | ICD-10-CM

## 2012-04-30 DIAGNOSIS — Z9109 Other allergy status, other than to drugs and biological substances: Secondary | ICD-10-CM | POA: Insufficient documentation

## 2012-04-30 DIAGNOSIS — F411 Generalized anxiety disorder: Secondary | ICD-10-CM | POA: Insufficient documentation

## 2012-04-30 DIAGNOSIS — K219 Gastro-esophageal reflux disease without esophagitis: Secondary | ICD-10-CM | POA: Insufficient documentation

## 2012-04-30 DIAGNOSIS — W19XXXA Unspecified fall, initial encounter: Secondary | ICD-10-CM | POA: Insufficient documentation

## 2012-04-30 DIAGNOSIS — S7000XA Contusion of unspecified hip, initial encounter: Secondary | ICD-10-CM | POA: Insufficient documentation

## 2012-04-30 DIAGNOSIS — S40019A Contusion of unspecified shoulder, initial encounter: Secondary | ICD-10-CM | POA: Insufficient documentation

## 2012-04-30 DIAGNOSIS — E669 Obesity, unspecified: Secondary | ICD-10-CM | POA: Insufficient documentation

## 2012-04-30 DIAGNOSIS — I2510 Atherosclerotic heart disease of native coronary artery without angina pectoris: Secondary | ICD-10-CM | POA: Insufficient documentation

## 2012-04-30 DIAGNOSIS — Z794 Long term (current) use of insulin: Secondary | ICD-10-CM | POA: Insufficient documentation

## 2012-04-30 DIAGNOSIS — Z803 Family history of malignant neoplasm of breast: Secondary | ICD-10-CM | POA: Insufficient documentation

## 2012-04-30 DIAGNOSIS — S46009A Unspecified injury of muscle(s) and tendon(s) of the rotator cuff of unspecified shoulder, initial encounter: Secondary | ICD-10-CM

## 2012-04-30 DIAGNOSIS — E119 Type 2 diabetes mellitus without complications: Secondary | ICD-10-CM | POA: Insufficient documentation

## 2012-04-30 DIAGNOSIS — G4733 Obstructive sleep apnea (adult) (pediatric): Secondary | ICD-10-CM | POA: Insufficient documentation

## 2012-04-30 DIAGNOSIS — S4980XA Other specified injuries of shoulder and upper arm, unspecified arm, initial encounter: Secondary | ICD-10-CM | POA: Insufficient documentation

## 2012-04-30 DIAGNOSIS — Z91040 Latex allergy status: Secondary | ICD-10-CM | POA: Insufficient documentation

## 2012-04-30 DIAGNOSIS — Z833 Family history of diabetes mellitus: Secondary | ICD-10-CM | POA: Insufficient documentation

## 2012-04-30 DIAGNOSIS — J45909 Unspecified asthma, uncomplicated: Secondary | ICD-10-CM | POA: Insufficient documentation

## 2012-04-30 DIAGNOSIS — Z7982 Long term (current) use of aspirin: Secondary | ICD-10-CM | POA: Insufficient documentation

## 2012-04-30 DIAGNOSIS — M549 Dorsalgia, unspecified: Secondary | ICD-10-CM | POA: Insufficient documentation

## 2012-04-30 MED ORDER — OXYCODONE-ACETAMINOPHEN 5-325 MG PO TABS
1.0000 | ORAL_TABLET | Freq: Once | ORAL | Status: AC
  Administered 2012-04-30: 1 via ORAL
  Filled 2012-04-30: qty 1

## 2012-04-30 MED ORDER — ONDANSETRON 4 MG PO TBDP
8.0000 mg | ORAL_TABLET | Freq: Once | ORAL | Status: AC
  Administered 2012-04-30: 8 mg via ORAL
  Filled 2012-04-30: qty 2

## 2012-04-30 MED ORDER — METHOCARBAMOL 500 MG PO TABS: 500.0000 mg | ORAL_TABLET | Freq: Three times a day (TID) | ORAL | Status: DC

## 2012-04-30 MED ORDER — HYDROCODONE-ACETAMINOPHEN 5-325 MG PO TABS: ORAL_TABLET | ORAL | Status: DC

## 2012-04-30 NOTE — ED Notes (Signed)
Pt. Stated, I fell yesterday and hurt my rt. And lt hip , rt. Arm , and shoulder .  I'm not able to get up without assistance.

## 2012-04-30 NOTE — ED Provider Notes (Cosign Needed)
History   This chart was scribed for Ward Givens, MD by Melba Coon. The patient was seen in room TR05C/TR05C and the patient's care was started at 5:15PM.    CSN: [id number]  Arrival date & time 04/30/12  1327   First MD Initiated Contact with Patient 04/30/12 1604      Chief Complaint  Patient presents with  . Hip Pain    (Consider location/radiation/quality/duration/timing/severity/associated sxs/prior treatment) The history is provided by the patient. No language interpreter was used.   [name] is a 66 y.o. female who presents to the Emergency Department complaining of constant, moderate to severe right shoulder and left hip pain with an onset yesterday afternoon pertaining to a fall over a yellow speed bump with no head contact or LOC. After the fall, she had to be helped up. She normally walks without  walker compared to baseline., but ambulation is decreased compared to baseline with pain and she is using her walker. Denies extremity edema, weakness, numbness, or tingling. No known allergies. No other pertinent medical symptoms.  PCP: Dr. Alroy Dust Orthopedist Dr Lequita Halt  Past Medical History  Diagnosis Date  . Unspecified essential hypertension   . CAD (coronary artery disease)   . Gastritis   . Asthma   . OSA (obstructive sleep apnea)   . Esophageal reflux   . Obesity, unspecified   . Degenerative joint disease   . Anxiety state, unspecified   . Hypercalcemia   . Anemia   . Other psoriasis   . Diverticulosis   . Sarcoidosis   . Diabetes mellitus   . Kidney disease   . Dyslipidemia   . PNA (pneumonia)     With pleural effusion, requiring thoracentesis  . Pleural effusion   . COPD (chronic obstructive pulmonary disease)     Past Surgical History  Procedure Date  . Cholecystectomy   . Vesicovaginal fistula closure w/  total abdominal hysterectomy   . 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 Tonsillectomy   .  Appendectomy   . Abdominal hysterectomy   . Breast reduction surgery 1982  . 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 Breast cancer    . Diabetes Mother     History  Substance Use Topics  . Smoking status: Never Smoker   . Smokeless tobacco: Never Used  . Alcohol Use: No  Lives at home Lives alone  OB History    Grav Para Term Preterm Abortions TAB SAB Ect Mult Living                  Review of Systems 10 Systems reviewed and all are negative for acute change except as noted in the HPI.   Allergies  Adhesive; Avelox; Codeine; Guaifenesin; Latex; and Oxycodone  Home Medications   Current Outpatient Rx  Name Route Sig Dispense Refill  . ALBUTEROL SULFATE HFA 108 (90 BASE) MCG/ACT IN AERS Inhalation Inhale 2 puffs into the lungs every 6 (six) hours as needed. For shortness of breath.    . ALLOPURINOL 300 MG PO TABS Oral Take 300 mg by mouth daily.    Marland Kitchen AMLODIPINE BESYLATE 10 MG PO TABS Oral Take 10 mg by mouth daily.    . ASPIRIN EC 81 MG PO TBEC Oral Take 81 mg by mouth daily.    . ATORVASTATIN CALCIUM 20 MG PO TABS Oral Take 20 mg  by mouth daily.    Marland Kitchen CLONIDINE HCL 0.1 MG PO TABS Oral Take 0.1 mg by mouth 2 (two) times daily.    Elmo Putt DM PO Oral Take 1 tablet by mouth daily as needed. For cough.    . ESOMEPRAZOLE MAGNESIUM 40 MG PO CPDR Oral Take 40 mg by mouth 2 (two) times daily.    Marland Kitchen FERROUS SULFATE 325 (65 FE) MG PO TABS Oral Take 325 mg by mouth daily with breakfast.    . FLUTICASONE PROPIONATE 50 MCG/ACT NA SUSP Nasal Place 2 sprays into the nose at bedtime. 48 g 3  . FLUTICASONE-SALMETEROL 250-50 MCG/DOSE IN AEPB Inhalation Inhale 1 puff into the lungs every 12 (twelve) hours.    . FUROSEMIDE 20 MG PO TABS Oral Take 20 mg by mouth daily.    Marland Kitchen GLIMEPIRIDE 4 MG PO TABS Oral Take 4 mg by mouth daily before breakfast.    . GUAIFENESIN ER 600 MG PO TB12 Oral Take 1,200 mg by mouth 2 (two) times daily.    Marland Kitchen  HYDRALAZINE HCL 10 MG PO TABS Oral Take 10 mg by mouth 3 (three) times daily.    . INSULIN DETEMIR 100 UNIT/ML [place] SOLN Subcutaneous Inject 80 Units into the skin every morning.     . INSULIN GLULISINE 100 UNIT/ML [place] SOLN Subcutaneous Inject into the skin 3 (three) times daily before meals. to be Used with  sliding scale as directed    . KETOROLAC TROMETHAMINE 0.4 % OP SOLN - NO CHARGE Both Eyes Place 1 drop into both eyes 3 (three) times daily.     Marland Kitchen LORAZEPAM 1 MG PO TABS Oral Take 1 mg by mouth daily as needed. For anxiety.    Marland Kitchen METOPROLOL TARTRATE 25 MG PO TABS Oral Take 12.5 mg by mouth daily.    Marland Kitchen POLYETHYLENE GLYCOL 3350 PO PACK Oral Take 17 g by mouth daily as needed.     Marland Kitchen PREDNISONE 5 MG PO TABS Oral Take 5 mg by mouth every other day.     Marland Kitchen PREGABALIN 100 MG PO CAPS Oral Take 100 mg by mouth every evening.    . SENNA-DOCUSATE SODIUM 8.6-50 MG PO TABS Oral Take 1 tablet by mouth at bedtime.    Marland Kitchen SITAGLIPTIN PHOSPHATE 50 MG PO TABS Oral Take 50 mg by mouth daily.      BP 171/63  Pulse 90  Temp 99.3 F (37.4 C) (Oral)  Resp 16  SpO2 95%  Vital signs normal except hypertension   Physical Exam  Nursing note and vitals reviewed. Constitutional: She is oriented to person, place, and time. She appears well-developed and well-nourished. No distress.  HENT:  Head: Normocephalic and atraumatic.  Right Ear: External ear normal.  Left Ear: External ear normal.  Nose: Nose normal.  Mouth/Throat: Oropharynx is clear and moist.  Eyes: Conjunctivae normal and EOM are normal. Pupils are equal, round, and reactive to light.  Neck: Normal range of motion. Neck supple. No tracheal deviation present.  Cardiovascular: Normal rate.   Pulmonary/Chest: Effort normal. No respiratory distress.  Musculoskeletal: Normal range of motion. She exhibits tenderness (Moderate right shoulder tendernesswiith limited abduction; Mild tenderness in lower lumbar spine; Tenderness over SI joint and posterior left  hip).       Minimal pain on flexion and extension of left elbow, but overall no pain with full ROM. No tenderness to right clavicle. No external or internal rotation or shortening of left leg. Patient very tender to palpation in her right  shoulder and has pain with abduction. No pain in her right wrist. No deformity or swelling she and her wrist.  Neurological: She is alert and oriented to person, place, and time.  Skin: Skin is warm and dry. There is erythema (Faint area of redness on mid forehead from prior injury.).  Psychiatric: She has a normal mood and affect. Her behavior is normal.    ED Course  Procedures (including critical care time)  Medications  sennosides-docusate sodium (SENOKOT-S) 8.6-50 MG tablet (not administered)  HYDROcodone-acetaminophen (NORCO/VICODIN) 5-325 MG per tablet (not administered)  methocarbamol (ROBAXIN) 500 MG tablet (not administered)  oxyCODONE-acetaminophen (PERCOCET/ROXICET) 5-325 MG per tablet 1 tablet (1 tablet Oral Given 04/30/12 1753)  ondansetron (ZOFRAN-ODT) disintegrating tablet 8 mg (8 mg Oral Given 04/30/12 1751)     COORDINATION OF CARE:  5:20PM - percocet, zofran, right shoulder XR, left hip XR, and lumbar spine XR will be ordered for Mrs Concord.    Dg Lumbar Spine Complete  04/30/2012  *RADIOLOGY REPORT*  Clinical Data: Fall, back pain.  LUMBAR SPINE - COMPLETE 4+ VIEW  Comparison: CT abdomen and pelvis 10/04/2010 and chest and two views abdomen 09/04/2010.  Findings: Vertebral body height and alignment are maintained. Degenerative disease is seen at L5-S1. Gas filled and mildly prominent loops of small bowel are seen and have been on all the comparison studies.  IMPRESSION: No acute finding.   Original Report Authenticated By: Bernadene Bell. Maricela Curet, M.D.    Dg Shoulder Right  04/30/2012  *RADIOLOGY REPORT*  Clinical Data: Fall. Pain.  RIGHT SHOULDER - 2+ VIEW  Comparison: None.  Findings: The humerus is located and the acromioclavicular  joint is intact.  Glenohumeral degenerative disease is noted.  Imaged right lung and ribs are unremarkable.  IMPRESSION: No acute finding.   Original Report Authenticated By: Bernadene Bell. D'ALESSIO, M.D.    Dg Hip Complete Left  04/30/2012  *RADIOLOGY REPORT*  Clinical Data: Fall, pain.  LEFT HIP - COMPLETE 2+ VIEW  Comparison: None.  Findings: Imaged bones, joints and soft tissues appear normal.  IMPRESSION: Negative exam.   Original Report Authenticated By: Bernadene Bell. D'ALESSIO, M.D.      1. Fall   2. Contusion of shoulder, right   3. Rotator cuff injury   4. Back pain   5. Contusion of hip, left    Patient's Medications  New Prescriptions   HYDROCODONE-ACETAMINOPHEN (NORCO/VICODIN) 5-325 MG PER TABLET    Take 1 or 2 po Q 6hrs for pain   METHOCARBAMOL (ROBAXIN) 500 MG TABLET    Take 1 tablet (500 mg total) by mouth 3 (three) times daily.    Plan discharge   Devoria Albe, MD, FACEP    MDM  I personally performed the services described in this documentation, which was scribed in my presence. The recorded information has been reviewed and considered.  Devoria Albe, MD, Armando Gang         Ward Givens, MD 04/30/12 [phone]

## 2012-05-11 ENCOUNTER — Other Ambulatory Visit: Payer: Self-pay | Admitting: Pulmonary Disease

## 2012-05-17 ENCOUNTER — Telehealth: Payer: Self-pay | Admitting: Pulmonary Disease

## 2012-05-17 NOTE — Telephone Encounter (Signed)
[email or web address]  

## 2012-05-18 MED ORDER — METHYLPREDNISOLONE 4 MG PO KIT: PACK | ORAL | Status: DC

## 2012-05-18 MED ORDER — AMOXICILLIN-POT CLAVULANATE 875-125 MG PO TABS: 1.0000 | ORAL_TABLET | Freq: Two times a day (BID) | ORAL | Status: DC

## 2012-05-18 NOTE — Telephone Encounter (Signed)
Per SN---ok to call in augmentin 875 mg  #14  1 po bid, increase fluids, medrol dosepak #1  Take as directed, try otc cough meds--delsym  2 tsp bid.  thanks

## 2012-05-18 NOTE — Telephone Encounter (Signed)
Pt is aware of sn RECS. I have sent this to the pharmacy. Nothing further was needed

## 2012-05-18 NOTE — Telephone Encounter (Signed)
I spoke with pt and she c/o hacking cough w/ chunks of green phlem, sore throat, head congestion, slight PND x 3 days now. Denies any f/c/n/v/chest tx/wheezing/SOB. She has taking 3 doses of alker seltzer plus. Please advise SN thanks  Allergies  Allergen Reactions  . Adhesive (Tape) Other (See Comments)    Blisters   . Avelox (Moxifloxacin Hcl In Nacl)     GI upset  . Codeine Other (See Comments)    "crazy"   . Guaifenesin Nausea And Vomiting  . Latex Swelling  . Oxycodone Nausea And Vomiting

## 2012-05-18 NOTE — Telephone Encounter (Signed)
I called pt and then was disconnected. Called pt back x 2 and went straight to VM. Left message tcb. [name] wait call back

## 2012-05-21 ENCOUNTER — Other Ambulatory Visit: Payer: Self-pay | Admitting: Pulmonary Disease

## 2012-05-24 ENCOUNTER — Telehealth: Payer: Self-pay | Admitting: Pulmonary Disease

## 2012-05-24 NOTE — Telephone Encounter (Signed)
Spoke with pt. She states needs sooner ov with SN since her ins expires 06/26/12 and she is scheduled for 06/27/12. I have cancelled the appt for 11/26 and scheduled her for 06/05/12. Nothing further needed.

## 2012-06-01 ENCOUNTER — Telehealth: Payer: Self-pay | Admitting: Pulmonary Disease

## 2012-06-01 NOTE — Telephone Encounter (Signed)
Called and spoke with patient, informed patient that Dr. Kriste Basque i s currently out of the office and will return in AM.  Per Marliss Czar, she will call patient in AM to inform her of Dr. Kirstie Mirza recs.  Will forward to [facility] as a reminder.  Thanks

## 2012-06-02 ENCOUNTER — Encounter: Payer: Self-pay | Admitting: *Deleted

## 2012-06-02 NOTE — Telephone Encounter (Signed)
Called and spoke with pt and she stated that her insurance will end on 11/25 and she needing to be seen before this date and will need rx for 90 day supply of all of her meds to last until jan.  i advised the pt that we can do all this at her appt on 11/20 and pt voiced her understanding. Nothing further is needed.

## 2012-06-05 ENCOUNTER — Ambulatory Visit: Payer: BC Managed Care – PPO | Admitting: Pulmonary Disease

## 2012-06-14 ENCOUNTER — Encounter (HOSPITAL_COMMUNITY): Payer: Self-pay | Admitting: *Deleted

## 2012-06-14 ENCOUNTER — Emergency Department (HOSPITAL_COMMUNITY)
Admission: EM | Admit: 2012-06-14 | Discharge: 2012-06-14 | Disposition: A | Discharge status: Home / Self Care | Payer: BC Managed Care – PPO | Source: Home / Self Care | Attending: Emergency Medicine | Admitting: Emergency Medicine

## 2012-06-14 ENCOUNTER — Emergency Department (INDEPENDENT_AMBULATORY_CARE_PROVIDER_SITE_OTHER): Payer: BC Managed Care – PPO

## 2012-06-14 DIAGNOSIS — J441 Chronic obstructive pulmonary disease with (acute) exacerbation: Secondary | ICD-10-CM

## 2012-06-14 HISTORY — DX: Gout, unspecified: M10.9

## 2012-06-14 MED ORDER — FLUTICASONE-SALMETEROL 250-50 MCG/DOSE IN AEPB: 1.0000 | INHALATION_SPRAY | Freq: Two times a day (BID) | RESPIRATORY_TRACT | Status: DC

## 2012-06-14 MED ORDER — PREDNISONE 20 MG PO TABS
ORAL_TABLET | ORAL | Status: AC
  Filled 2012-06-14: qty 2

## 2012-06-14 MED ORDER — IPRATROPIUM BROMIDE 0.02 % IN SOLN
0.5000 mg | Freq: Once | RESPIRATORY_TRACT | Status: AC
  Administered 2012-06-14: 0.5 mg via RESPIRATORY_TRACT

## 2012-06-14 MED ORDER — PREDNISONE 20 MG PO TABS
40.0000 mg | ORAL_TABLET | Freq: Once | ORAL | Status: AC
  Administered 2012-06-14: 40 mg via ORAL

## 2012-06-14 MED ORDER — ALBUTEROL SULFATE HFA 108 (90 BASE) MCG/ACT IN AERS: 1.0000 | INHALATION_SPRAY | Freq: Four times a day (QID) | RESPIRATORY_TRACT | Status: DC | PRN

## 2012-06-14 MED ORDER — ALBUTEROL SULFATE (5 MG/ML) 0.5% IN NEBU
INHALATION_SOLUTION | RESPIRATORY_TRACT | Status: AC
  Filled 2012-06-14: qty 1

## 2012-06-14 MED ORDER — ALBUTEROL SULFATE (5 MG/ML) 0.5% IN NEBU
5.0000 mg | INHALATION_SOLUTION | Freq: Once | RESPIRATORY_TRACT | Status: AC
  Administered 2012-06-14: 5 mg via RESPIRATORY_TRACT

## 2012-06-14 MED ORDER — ALBUTEROL SULFATE HFA 108 (90 BASE) MCG/ACT IN AERS: 2.0000 | INHALATION_SPRAY | Freq: Four times a day (QID) | RESPIRATORY_TRACT | Status: DC | PRN

## 2012-06-14 NOTE — ED Notes (Signed)
Initial VS @ 1810.

## 2012-06-14 NOTE — ED Notes (Signed)
C/o chills and sore throat onset 11/5 and thought it was the flu.  No fever.  Started coughing 11/8 or 11/9.  Can't sleep at night because it feels like something is hung in her throat- feels full.  Dr. Kriste Basque sent her out a Z- pak and Delsym on 11/9.  She said she felt like her asthma was flaring up on Sun. 11/10.

## 2012-06-14 NOTE — ED Provider Notes (Addendum)
History     CSN: [id number]  Arrival date & time 06/14/12  1756   First MD Initiated Contact with Patient 06/14/12 1804      Chief Complaint  Patient presents with  . Shortness of Breath    (Consider location/radiation/quality/duration/timing/severity/associated sxs/prior treatment) HPI Comments: Patient presents urgent care this evening complaining of an ongoing sore throat wheezing or shortness of breath since the first week of November. She has been coughing for approximately 4-5 days. She called her doctor several days ago and she was prescribed over the phone a Z-pack which she took for 5 days. Also along with some Delsym. She chronically takes 2 tablets of prednisone (5 mg). Describe some discomfort when she swallows and feeling short of breath and with a cough that goes from dry to occasional production of phlegm. Patient denies any chest pains, dizziness, or fevers. She also was treated with antibiotics during the month of October after she had another flareup.  Patient is a 66 y.o. female presenting with shortness of breath. The history is provided by the patient.  Shortness of Breath  The current episode started 5 to 7 days ago. The problem occurs frequently. The problem has been unchanged. The problem is moderate. The symptoms are relieved by rest and beta-agonist inhalers. Associated symptoms include rhinorrhea, sore throat, cough, shortness of breath and wheezing. Pertinent negatives include no chest pain, no chest pressure, no fever and no stridor. She has not inhaled smoke recently. She is currently using steroids.    Past Medical History  Diagnosis Date  . Unspecified essential hypertension   . CAD (coronary artery disease)   . Gastritis   . Asthma   . OSA (obstructive sleep apnea)   . Esophageal reflux   . Obesity, unspecified   . Degenerative joint disease   . Anxiety state, unspecified   . Hypercalcemia   . Anemia   . Other psoriasis   . Diverticulosis   .  Sarcoidosis   . Diabetes mellitus   . Kidney disease   . Dyslipidemia   . PNA (pneumonia)     With pleural effusion, requiring thoracentesis  . Pleural effusion   . COPD (chronic obstructive pulmonary disease)   . Gout     Past Surgical History  Procedure Date  . Cholecystectomy   . Vesicovaginal fistula closure w/  total abdominal hysterectomy   . 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 Tonsillectomy   . Appendectomy   . Abdominal hysterectomy   . Breast reduction surgery 1982  . 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 Diabetes Mother   . Breast cancer Maternal Aunt     History  Substance Use Topics  . Smoking status: Never Smoker   . Smokeless tobacco: Never Used  . Alcohol Use: No    OB History    Grav Para Term Preterm Abortions TAB SAB Ect Mult Living                  Review of Systems  Constitutional: Positive for chills and activity change. Negative for fever, diaphoresis and fatigue.  HENT: Positive for sore throat and rhinorrhea.   Respiratory: Positive for cough, shortness of breath and wheezing. Negative for stridor.   Cardiovascular: Negative for chest pain, palpitations and leg swelling.  Neurological: Negative for dizziness and headaches.    Allergies  Adhesive; Avelox; Codeine; Guaifenesin; Latex;  and Oxycodone  Home Medications   Current Outpatient Rx  Name  Route  Sig  Dispense  Refill  . AMLODIPINE BESYLATE 10 MG PO TABS   Oral   Take 10 mg by mouth daily.         . AMOXICILLIN-POT CLAVULANATE 875-125 MG PO TABS   Oral   Take 1 tablet by mouth 2 (two) times daily.   14 tablet   0   . ASPIRIN EC 81 MG PO TBEC   Oral   Take 81 mg by mouth daily.         . ATORVASTATIN CALCIUM 20 MG PO TABS   Oral   Take 20 mg by mouth daily.         Marland Kitchen CLONIDINE HCL 0.1 MG PO TABS   Oral   Take 0.1 mg by mouth 2 (two) times daily.         Marland Kitchen  ESOMEPRAZOLE MAGNESIUM 40 MG PO CPDR   Oral   Take 40 mg by mouth 2 (two) times daily.         Marland Kitchen FERROUS SULFATE 325 (65 FE) MG PO TABS   Oral   Take 325 mg by mouth daily with breakfast.         . FLUTICASONE PROPIONATE 50 MCG/ACT NA SUSP   Nasal   Place 2 sprays into the nose at bedtime.   48 g   3   . FUROSEMIDE 20 MG PO TABS   Oral   Take 20 mg by mouth daily.         Marland Kitchen GLIMEPIRIDE 4 MG PO TABS   Oral   Take 4 mg by mouth daily before breakfast.         . GUAIFENESIN ER 600 MG PO TB12   Oral   Take 1,200 mg by mouth 2 (two) times daily.         Marland Kitchen HYDRALAZINE HCL 10 MG PO TABS   Oral   Take 10 mg by mouth 3 (three) times daily.         . INSULIN DETEMIR 100 UNIT/ML [place] SOLN   Subcutaneous   Inject 80 Units into the skin every morning.          . INSULIN GLULISINE 100 UNIT/ML [place] SOLN   Subcutaneous   Inject into the skin 3 (three) times daily before meals. to be Used with  sliding scale as directed         . JANUVIA 50 MG PO TABS      take 1 tablet by mouth once daily   30 each   11   . KETOROLAC TROMETHAMINE 0.4 % OP SOLN - NO CHARGE   Both Eyes   Place 1 drop into both eyes 3 (three) times daily.          Marland Kitchen METOPROLOL TARTRATE 25 MG PO TABS   Oral   Take 12.5 mg by mouth daily.         Marland Kitchen PREDNISONE 5 MG PO TABS   Oral   Take 5 mg by mouth every other day.          Marland Kitchen PREGABALIN 100 MG PO CAPS   Oral   Take 100 mg by mouth every evening.         . SENNA-DOCUSATE SODIUM 8.6-50 MG PO TABS   Oral   Take 1 tablet by mouth at bedtime.         . ALBUTEROL SULFATE HFA 108 (90  BASE) MCG/ACT IN AERS   Inhalation   Inhale 2 puffs into the lungs every 6 (six) hours as needed for wheezing or shortness of breath. For shortness of breath.   3.7 Inhaler   1   . ALBUTEROL SULFATE HFA 108 (90 BASE) MCG/ACT IN AERS   Inhalation   Inhale 1-2 puffs into the lungs every 6 (six) hours as needed for wheezing.   1 Inhaler   0   .  ALLOPURINOL 300 MG PO TABS   Oral   Take 300 mg by mouth daily.         Elmo Putt DM PO   Oral   Take 1 tablet by mouth daily as needed. For cough.         [place] SULFATE 325 (65 FE) MG PO TABS      take 1 tablet by mouth once daily WITH A MEAL   30 tablet   6   . FLUTICASONE-SALMETEROL 250-50 MCG/DOSE IN AEPB   Inhalation   Inhale 1 puff into the lungs 2 (two) times daily.   60 each   0   . FLUTICASONE-SALMETEROL 250-50 MCG/DOSE IN AEPB   Inhalation   Inhale 1 puff into the lungs every 12 (twelve) hours.   60 each   0   . HYDROCODONE-ACETAMINOPHEN 5-325 MG PO TABS      Take 1 or 2 po Q 6hrs for pain   10 tablet   0   . LORAZEPAM 1 MG PO TABS   Oral   Take 1 mg by mouth daily as needed. For anxiety.         . METHOCARBAMOL 500 MG PO TABS   Oral   Take 1 tablet (500 mg total) by mouth 3 (three) times daily.   30 tablet   0   . METHYLPREDNISOLONE 4 MG PO KIT      follow package directions   21 tablet   0   . POLYETHYLENE GLYCOL 3350 PO PACK   Oral   Take 17 g by mouth daily as needed.          Marland Kitchen SITAGLIPTIN PHOSPHATE 50 MG PO TABS   Oral   Take 50 mg by mouth daily.           BP 176/106  Pulse 90  Temp 98.4 F (36.9 C)  Resp 24  SpO2 97%  Physical Exam  Nursing note and vitals reviewed. Constitutional: No distress.  Cardiovascular: Normal rate and regular rhythm.  Exam reveals no gallop and no friction rub.   No murmur heard. Pulmonary/Chest: No accessory muscle usage. Not tachypneic and not bradypneic. No respiratory distress. She has decreased breath sounds. She has wheezes. She has no rhonchi. She has rales. She exhibits no tenderness.  Skin: She is not diaphoretic.    ED Course  Procedures (including critical care time)  Labs Reviewed - No data to display Dg Chest 2 View  06/14/2012  *RADIOLOGY REPORT*  Clinical Data: Shortness of breath, cough, congestion and wheezing  CHEST - 2 VIEW  Comparison: Chest x-ray of 03/26/2012   Findings: No pneumonia is seen.  There does appear to be mild pulmonary vascular congestion present.  Cardiomegaly is stable. There are degenerative changes throughout the thoracic spine.  IMPRESSION:  Stable cardiomegaly.  Question mild pulmonary vascular congestion.   Original Report Authenticated By: Dwyane Dee, M.D.      1. COPD exacerbation       MDM  COPD exacerbation. Patient improved  clinically with a breathing treatment with albuterol and Atrovent and also a dose of oral prednisone. Have encouraged patient to use albuterol the next 24 hours every 6 hours. And to resume prednisone tomorrow 40 mg for the next 4 days. Have encouraged her to call her doctor tomorrow to have a followup visit Friday. We also discussed in details what symptoms should warrant further evaluation in the emergency department. Mainly increasing shortness of breath, chest pains, dizziness, or fevers. Patient agrees with treatment plan, followup care with her doctor in 2 days, and acknowledged symptoms will require further evaluation in the emergency department.        Jimmie Molly, MD 06/14/12 1940  Jimmie Molly, MD 06/14/12 2022

## 2012-06-15 ENCOUNTER — Telehealth: Payer: Self-pay | Admitting: Pulmonary Disease

## 2012-06-15 NOTE — Telephone Encounter (Signed)
Spoke with pt She was having som SOB last night and went to [facility] Was given neb tx and pred taper to start She states that they advised her to f/u with SN in 2 days.  She has ov with him pending for 06/21/12 She states that she seems to be improving already, and wants to know if SN thinks that she should just keep ov for 11/20 or come in sooner Please advise thanks!

## 2012-06-15 NOTE — Telephone Encounter (Signed)
Per SN---she can keep her 11/20 appt.  i called and spoke with pt and she is aware. Nothing further is needed.

## 2012-06-20 ENCOUNTER — Encounter: Payer: Self-pay | Admitting: *Deleted

## 2012-06-21 ENCOUNTER — Ambulatory Visit (INDEPENDENT_AMBULATORY_CARE_PROVIDER_SITE_OTHER): Payer: BC Managed Care – PPO | Admitting: Pulmonary Disease

## 2012-06-21 ENCOUNTER — Encounter: Payer: Self-pay | Admitting: Pulmonary Disease

## 2012-06-21 ENCOUNTER — Telehealth: Payer: Self-pay | Admitting: Pulmonary Disease

## 2012-06-21 VITALS — BP 142/68 | HR 94 | Temp 98.7°F | Ht 62.5 in | Wt 224.8 lb

## 2012-06-21 DIAGNOSIS — K219 Gastro-esophageal reflux disease without esophagitis: Secondary | ICD-10-CM

## 2012-06-21 DIAGNOSIS — I2510 Atherosclerotic heart disease of native coronary artery without angina pectoris: Secondary | ICD-10-CM

## 2012-06-21 DIAGNOSIS — E119 Type 2 diabetes mellitus without complications: Secondary | ICD-10-CM

## 2012-06-21 DIAGNOSIS — J209 Acute bronchitis, unspecified: Secondary | ICD-10-CM

## 2012-06-21 DIAGNOSIS — J45909 Unspecified asthma, uncomplicated: Secondary | ICD-10-CM

## 2012-06-21 DIAGNOSIS — I5032 Chronic diastolic (congestive) heart failure: Secondary | ICD-10-CM

## 2012-06-21 DIAGNOSIS — D869 Sarcoidosis, unspecified: Secondary | ICD-10-CM

## 2012-06-21 DIAGNOSIS — N259 Disorder resulting from impaired renal tubular function, unspecified: Secondary | ICD-10-CM

## 2012-06-21 DIAGNOSIS — M1990 Unspecified osteoarthritis, unspecified site: Secondary | ICD-10-CM

## 2012-06-21 DIAGNOSIS — F411 Generalized anxiety disorder: Secondary | ICD-10-CM

## 2012-06-21 DIAGNOSIS — I10 Essential (primary) hypertension: Secondary | ICD-10-CM

## 2012-06-21 DIAGNOSIS — G4733 Obstructive sleep apnea (adult) (pediatric): Secondary | ICD-10-CM

## 2012-06-21 DIAGNOSIS — E669 Obesity, unspecified: Secondary | ICD-10-CM

## 2012-06-21 DIAGNOSIS — E7800 Pure hypercholesterolemia, unspecified: Secondary | ICD-10-CM

## 2012-06-21 MED ORDER — LEVALBUTEROL HCL 0.63 MG/3ML IN NEBU
0.6300 mg | INHALATION_SOLUTION | Freq: Once | RESPIRATORY_TRACT | Status: AC
  Administered 2012-06-21: 0.63 mg via RESPIRATORY_TRACT

## 2012-06-21 MED ORDER — HYDROCOD POLST-CHLORPHEN POLST 10-8 MG/5ML PO LQCR: 5.0000 mL | Freq: Two times a day (BID) | ORAL | Status: DC

## 2012-06-21 MED ORDER — ALBUTEROL SULFATE (2.5 MG/3ML) 0.083% IN NEBU: 2.5000 mg | INHALATION_SOLUTION | Freq: Four times a day (QID) | RESPIRATORY_TRACT | Status: DC

## 2012-06-21 NOTE — Progress Notes (Signed)
Subjective:    Patient ID: [name], female    DOB: 1946-07-29, 66 y.o.   MRN: [id number]  HPI 66 y/o BF here for a follow up visit... he has multiple medical problems as noted below...    ~  November 24, 2010:  [name], no evid malignancy;  Post-op her calcium level returned to normal w/o further intervention;  She had several post-op complications including a subphrenic abscess that required percutaneous drainage, and a left pleural effusion/ basilar atelectasis (s/p thoracentesis & improved)... She has improved slowly at home w/ family help & is planning return to work 11/30/10;  She notes sl cough w/ sm amt thick beige sput, & SOB/ weakness is gradually better;  Denies CP etc, notes insomnia & anxiety...  CXR today w/ improved left base, no clear baseline film;  Labs show Hg=11.6, BS=117, Creat=1.6, FLP looks good, LFTs are OK x sl incr AlkPhos, but note Ca=11.1 (was 8.6 one month ago)...  ~  March 01, 2011:  58mo ROV & she presents w/ recent cough, congestion, green drainage, and tired/ SOB/ etc; she notes incr arthritis pain, aching/ sore and some constipation;  We discussed checking f/u CXR (clear, NAD); and Labs- CBC-ok, BS=167, A1c=8.4, BUN=44, Creat=1.9, Ca=12.0, Phos=4.0, AlkPhos=167, GGT=151, ACE=78;  We discussed Rx w/ Avelox, Mucinex, Fluids; and incr Miralax/ Senakot-S/ MOM for the constipation...    Recurrent hypercalcemia most likely from active Sarcoidosis> labs w/ incr AlkPhos & GGT> we will image the liver at this time to compare to prev; and consult w/ Endocrinology for their opinion regarding the hypercalcemia AND her IDDM (esp in view of poss Prednisone rx for the calcium & sarcoid)...    Renal Insuffic followed by DrFox for Nephrology> Creat up to 1.9 from 1.6 last, needs incr fluid intake, she knows to avoid nephrotoxic agents/ meds/ etc...    She has known MGUS followed by  DrGranfortuna but prev bone marrow was neg etc; due for MGUS labs this fall, current CBC is stable: Hg=12.8, Fe=45...  ~  May 27, 2011:  58mo ROV & she has established w/ DrAltheimer for Endocrine on Levemir & Apidra, along w/ Glimepiride & Januvia; we called in Prednisone 20mg /d in early Oct to start rx for her high calcium levels & labs from DrA's office reviewed >> Calcium has improved to 10.3 on 3wks of oral Pred...    Sarcoidosis w/ HYPERCALCEMIA> Ca++ was 12.4 -13.4 in Aug & Sept; since starting Pred20mg /d in early Oct her Ca++ has improved to 10.3 now; we decided to slowly taper the Pred in light of her IDDM & improved Ca++ level (try 20mg , alt w/ 10mg  Qod)...    HBP> on Norvasc & Diovan; BP= 132/80 & she denies CP, palpit, dizzy, ch in SOB/ DOE, edema, etc...    CHOL> on Lip20; FLP 4/12 looked good & we reviewed low fat diet restrictions...    DM on Insulin> as above, followed by DrA on Levemir30 & ApidraSS coverage at meals; BS at home varies 150-400+, FBS today=80, latest A1c was 7.6 in Aug...    Obesity> wt= 180# (63"Tall & BMI=32) which is up 8# and we reviewed diet, exercise, & wt reduction strategies...    GI> GERD on NexiumBid; and we reviewed antireflux regimen (elev HOB, npo after dinner, etc)...    Renal Insuffic> Creat had incr to 2.3 when Ca++ rose to 13.4, but has returned to 1.8 now; advised incr  free water intake...    Anxiety> on Alprazolam prn; she's been under extra stress w/ loss of job, this illness, family issues...  ~  June 28, 2011:  55mo ROV> on Pred 20-10 Qod for the last month and BMet today shows Ca++=9.8;  She is anxious to wean the Pred as quickly as poss due to incr appetite, weight gain, difficulty controlling her DM, etc;  She has been seeing DrAltheimer every 3-4wks w/ adjustments in her insulin> currently on Levemir 46u qd; Apidra 10+SS Tid at meals, Amaryl & Januvia;  BS's vary tremendously & she is struggling w/ diet (weight up 8# in the last month to  188# today);  We decided to cut the Pred to 10mg /d now and then further to 10-5 qod after 3wks w/ ROV in 6 weeks...  ~  August 16, 2011:  6wk ROV & post hosp check> she was Adm by [facility] 1/8 - 08/12/11 w/ dyspnea (DOE, orthopnea, edema) thought to be due to diastolic heart failure (2DEcho w/ Gr2DD, BNP=2200), she was diuresed & improved, disch on LASIX40mg - 1/2 tab each am & her prev Diovan was stopped (due to renal insuffic w/ Creat=2.4)...  Her serum Calcium levels were all norm 9.6-9.8;  A1c=9.3;  Hg=10.4>> See below...    Feeling better since disch;  Weight 196# is still 8# heavier than her last visit w/ me 11/12;  We decided to decr her Pred from 10-5 Qod to 5mg /d & keep Lasix at 20mg /d... LABS 1/13:  BUN=33, Creat=1.7, BNP=100;  Ca=9.7;  Hg=12.9;  BS=69 & she will f/u w/ DrAltheimer...  ~  October 06, 2011:  88mo ROV on Pred 5mg /d & generally stable but w/ mult minor somatic complaints- sl SOB, some cough, thick white mucous, right arm discomfort, & reflux symptoms;  She had a ZPak called in about 2wks ago;  She is on a good antireflux regimen, plus Flonase & Saline;  We will add Tussionex for prn use;  Unfortunately weight is up 9# further tp 205# today;  BP remains controlled on 5 MEDS (see below);  She continues to f/u w/ DrAltheimer for DM> on Levimir 46u & Apidra 03-15-13;  We decided to cut the Pred to 5mg  QOD & keep the Lasix 20mg /d... LABS 3/13:  BS=135,  BUN=38, Creat=1.6;  Ca=10.1;  ACE=48;  BNP=128...  ~  January 06, 2012:  4mo ROV & [name] has been on Pred5mg Qod for the last 4mo; she has noted some joint pains & has rash on her arms- seeing derm w/ hx Psoriasis but she feels their topical rx isn't working; she also notes sl cough, some greenish sput production, chest feels tight, & some right sided chest discomfort- she is requesting a ZPak which seems to work for her...     She saw DrAlt 4/13> Ave BS from her machine 249 & A1c=9.1, insulin adjusted; FLP looked good on Lip20; extensive note  reviewed...    [name], meds, xrays and labs> see below>> LABS 6/13:  Chems- BS=169 BUN=41 Creat=2.3 BNP=104 Ca=11.0 ACE=86;  CBC- ok w/ Hg=12.4 REC> Stop Lasix20 for now, increase Pred to 10mg /d, ROV 6wks w/ BMet...  ~  March 30, 2012:  4mo ROV & post-ER visit> she's been to the ER twice in the last 88mo w/ SOB, cough, congestion, etc; on Pred, Advair, Mucinex, Tussionex, & given antibiotics, Neb Rx, etc & improved;  Also rec to restart     She saw DrWall for Cards f/u 7/13> known CAD, DOE, good BP control &  2DEcho showed good LVF & no evid for pulmHTN; continue same meds, & Lasix...    She had f/u DrAltheimer 6/13> DM, renal insuffic, hyperlipidemia, hypercalcemia, etc; on Levemir80, Apidra20-34-30, [id number] & Amaryl4; BS=118 A1c=8.8.Marland KitchenMarland Kitchen [name], meds, xrays and labs> see below for updates >> CXR 8/13 showed cardiomeg, ?mild interstitial edema, mild DJD TSpine... EKG 8/13 showed NSR, rate93, min NSSTTWA, NAD... LABS 8/13:  CBC- Hg=11.9 WBC=18K;  Chems- BUN=30 Creat=1.6 Ca=10.0 BNP=2960 ==> started back on Lasix40/d...  ~  April 28, 2012:  36mo ROV & last visit we decreased the Pred to 5mg /d (on this for hypercalcemia from Sarcoid) & decreased the Lasix to 20mg /d (due to RI w/ Cr=2.2);  Follow up labs today showed Ca=9.1 and we decided to decr the Pred further to 5mg  Qod;  In addition her BUN=40, Creat=2.1, BNP=196 (ok to continue Lasix20); she understands that if her Creat starts to rise further that we will have to send her to Nephrology...    She saw DrAltheimer for f/u 9/13> note reviewed & he adjusted her insulin regimen- Levemir 80uQam & Apidra 20-34-30 + SS adjustments...    She fell 8/30 & hit her head> went to ER & DrLockwood's note is reviewed- c/o headache & left knee pain; Exam showed forehead hematoma & CTBrain showed left frontal scalp hematoma, no fx, mild cortical atrophy, mild carotid atherosclerosis, NAD;  Maxillofacial CT was neg w/o facial bone fx;   Left knee prosthesis & no acute changes...    She reports that she just had an ESI injection from DrRamos for LBP & she is improved...    [name], meds, xrays and labs> see below for updates >> LABS 9/13:  Chems- BS=234, Ca=9.1, BUN=40, Creat=2.1, BNP=196   ~  June 21, 2012:  90mo ROV & [name] went to the ER 06/14/12 w/ c/o SOB> she had developed a sore throat, wheezing, cough, & SOB; she had taken ZPak, Delsym, Proair which helped somewhat;  CXR showed cardiomeg, pulm vasc congestion, DJD in TSpine, NAD;  ER treated w/ Pred boost to 40mg /d, & NEB Rx which really helped;  She notes stuff head & some drainage, denies GI/ reflux symptoms;  She is obese, cushingoid, and we were in the process of weaning off the Pred when it was boosted up by ER- we gave her a NEB Rx & this reallly helped;  Placed on O2 by nasal cannula for hypoxemia- 84% on RA;  Decision made to wean Pred down to 5mg Qod as before, continue Advair250Bid, add NEBS Qid w/ Albut vs Proair, plus her Mucinex/ Fluids/ etc...    She continues her regular f/u appts w/ DrAltheimer, Endocrine> on Levemir, Apidra, Amaryl, Januvia; last A1c=8.1 is sep2013...    [name], meds, xrays and labs> see below for updates >>          Problem List:    OBSTRUCTIVE SLEEP APNEA - Sleep Study 11/07 showed RDI=21 w/ desat to 62% during REM.Marland Kitchen. eval by DrSood w/ Rx for CPAP 12... compliance poor- only using it 1-2 times/wk... encouraged to use CPAP more regularly & to f/u w/ DrSood regarding the mask interface problems... ~  8/11:  she reports new mask but still not using CPAP regularly, "I rest fairly well". ~  7/12:  She reports not using her CPAP, and not resting well... ~  1/13:  CPAP used briefly during [facility] for diastolic CHF, but still not compliant w/ home use...  BRONCHITIS, ACUTE - on ADVAIR 250Bid (  using Prn only) & PROAIR as needed. SARCOIDOSIS - extensive gran inflamm in spleen, +hypercalcemia, no obvious lung  involvement. ~  CXR 1/09 was pre-op for TKR- sl cardomeg & ?mild vasc congestion. ~  CXR 2/11 showed norm heart size & vascularity, clear, s/p cholecystectomy, DJD in TSpine. ~  CXR & CT Chest 11/11 showed sl peribronch thickening, no lung lesions, no signif adenopathy. ~  PET scan 1/12 showed hypermetabolic activ in spleen & lymph nodes in the supraclav, hilar, mediastinal, periaotic & iliac region as well. ~  CXRs 3/12 in the perioperative period after splenectomy showed left effusion==> tapped & improved aeration. ~  CXR 4/12 back to baseline w/ clear bases, essent wnl... ~  CXR 7/12 showed norm heart size, clear lungs, no definite adenopathy, NAD... ~  LABS 7/12 w/ ACE=78 (8-52), Ca=12.0, AlkPhos=176 (39-117), GGT=151 (7-51) ~  Labs from DrAltheimer> 8/12: Ca=12.4, Creat=1.8; then 9/12: Ca=13.4, Creat=2.3;  ~  10/12:  PREDNISONE 20mg /d started 10/12 & 3wks later Ca=10.3, Creat=1.8; therefore weaned to 20-10 Qod. ~  11/12: Labs on Pred 20-10 Qod showed Ca++= 9.8; we will continue to wean Pred. ~  1/13:  CXR showed cardiomeg, mild interstitial edema, DJD spine; Labs showed Ca=9.7 & we decided to wean Pred to 5mg /d... ~  3/13:  Labs showed Ca=10.1 on Pred 5mg /d; decided to wean to 5mg Qod... ~  6/13:  Labs showed Ca=11.0, ACE=86; on Pred5Qod & rec to incr back to 10mg /d... ~  8/13:  CXR showed cardiomeg, ?mild interstitial edema, mild DJD TSpine... ~  Labs 8/13 showed Ca=10.0 on Pred10/d; rec to decr further to 5mg /d... ~  Labs 9/13 showed Ca= 9.1 on Pred5mg /d & she will try to decr to 5Qod again... ~  11/13: she recently went to ER w/ incr SOB & was placed on Pred40/d; we are adding NEBS w/ ALBUT2.5mg Qid & wean Pred back to 5mg Qod as before.  HYPERTENSION - taking METOPROLOL 25mg - 1/2 daily, NORVASC 10mg /d, APRESOLINE 10mg Tid, CLONIDINE 0.1mg Bid, LASIX 20mg - see below; off prev [id number] due to azotemia w/ diuresis. ~  4/12:  BP here 132/62 and she denies HA, visual changes, CP, palipit,  dizziness, syncope, change in dyspnea, etc... ~  10/12:  BP= 132/80 & she denies CP, palpit, dizzy, ch in SOB/ DOE, edema, etc... ~  11/12:  BP= 132/82 & she is relatively asymptomatic other than DOE from her weight and some musc cramps... ~  1/13: Post Hosp BP= 146/70 on ? - Metop12.5, Amlod10, Clonid0.1Bid, Lasix20; she denies CP, palpit, SOB, edema> improved from recent hosp... ~  3/13: She called in the interim for "refill" Apresoline not prev on any med list; BP= 150/80 on all 5 meds; must elim sodium & get wt down... ~  6/13:  BP= 142/80 on but BUN-41 Creat=2.3 BNP=104; rec to stop Lasix for now, f/u BMet 6wks. ~  8/13:  BP= 140/70 on same meds & BUN=30, Creat=1.6, but BNP incr to 2960 & LASIX 40mg /d restarted... ~  9/13:  Follow up labs showed BUN=45, Creat=2.2 on Lasix40 and we rec decr to Lasix20... ~  9/13:  BP= 122/74 on w/ BUN=40, Creat=2.1, & BNP=196... Continue same. ~  11/13:  BP= 142/68 on same ...  CAD - on ASA 81mg /d...  ~  Hx non-obstructive CAD w/ cath 6/07 by DrStuckey showing luminal irregularities & tiny first marginal branch of the CIRC w/ ostial lesion... good LVF. ~  2DEcho 9/07 showed mildly calcif AoV and normal LVF & wall motion. ~  2DEcho 11/11 showed  norm LV wall thickness, norm LVF w/ EF= 60-65%, norm atria, norm valves, trivial peric fluid behind heart. ~  2DEcho 1/13 showed mild LVH, norm LVF w/ EF=55-60% & no regional wall motion abn, Gr2DD, norm RV...  CEREBROVASCULAR DISEASE - she remains on ASA 81mg /d without TIA's or other neuro manifestations...  ~  MRA Br 9/07 showed mod intracranial atherosclerotic changes...   HYPERLIPIDEMIA - on LIPITOR 20mg /d... FLP monitored at each OV by DrAltheimer... ~  FLP 7/07 showed TChol 114, TG 85, HDL 28, LDL 69 ~  FLP 4/09 showed TChol 154, TG 192, HDL 34, LDL 82... discussed poss of adding fibrate- hold... ~  FLP 2/10 on Lip20 showed TChol 159, TG 207, HDL 29, LDL 83... rec> add Fenoglide (she  didn't). ~  FLP 2/11 on Lip20 showed TChol 185, TG 242, HDL 43, LDL 99... add FENOFIBRATE 160mg /d... ~  FLP 8/11 on Lip20+Feno160 showed TChol 167, TG 216, HDL 37, LDL 96... she stopped Feno160. ~  FLP 4/12 on Lip20 showed TChol 146, TG 115, HDL 35, LDL 88 ~  FLP 2/13 on Lip20 showed TChol 159, TG 176, HDL 41, LDL 86  DM - on LEVEMIR & APIDRA SS coverage now per DrAltheimer; plus GLIMEPIRIDE 4mg /d and JANUVIA 50mg /d. ~  labs 4/08 showed BS=138 & HgA1c=7.5.Marland Kitchen. home BS = 100 to >300... misses doses and not on diet or exercising... ~  1/09 became hypoglycemic in hosp on 4 meds post knee surg- meds adjusted... ~  3/09 restarted GLIMEPIRIDE 4mg - 1/2 tab each AM, & continue Lantus & Metformin... ~  labs 4/09 showed BS= 148, HgA1c= 7.3.Marland Kitchen. rec> back on 4 med regimen due to poor control at home. ~  6/09 discussed titrating the Lantus up until FBS 100-120 range... ~  labs 2/10 showed BS= 164, HgA1c= 8.6.Marland KitchenMarland Kitchen very disappointing- needs home BS monitoring, incr Lantus. ~  labs 2/11 showed BS= 298, A1c= 8.4...  discussed change Lantus to LEVEMIR 40 u daily... ~  labs 8/11 showed BS= 202, A1c= 10.9.Marland Kitchen. rec> incr Levemir to 50, consider Humalog. ~  Nov-Dec 2011:  BS up w/ adjust of meds & addition of Pred after hosp 11/11 & hypercalcemia... ~  She states she returned to Levemir 40u daily after the splenectomy hosp due to appetite etc;  4/12 BS=117 ~  Labs 7/12 showed BS= 167, A1c= 8.4> on Levemir 50u/d & Amaryl 4mg  Qam (note creat 1.9). ~  8-10/12: on Levemir30 + ApidraSS per DrA & BS are 150-400+ due to the Pred20; A1c 8/12 was 7.6 ~  11/12:  DrA has increased her Levemir to 46u/d & the Apidra to 10+SS cover Tid... BS here= 168 ~  1/13: Covered w/ SSI in Hosp but A1c=9.3 (Creat up to 2.4 but improved to 1.7 w/ adjust meds)... ~  DrAltheimer's notes indicates continued adjustments in her insulin regimen w/ Levemir & Apidra...  OBESITY (ICD-278.00) - weight down to 171 after hosp 11/11- doing better on diet, not  yet exercising. ~  weight 220-230# in the early 1990's... ~  weight 210-220# in the early 2000's... ~  weight 2/10 = 192# ~  weight 2/11 = 186# ~  weight 8/11 = 174# ~  weight 12/11 = 171# ~  Weight 4/12 = 168# ~  Weight 7/12 = 171# ~  Weight 10/12 = 180# ~  Weight 11/12 = 188# ~  Weight 1/13 = 196# ~  Weight 3/13 = 205#... She MUST get back on diet, incr exercise, get wt down. ~  Weight 6/13 =  205# ~  Weight 8/13 = 210# ~  Weight 9/13 = 220#... What happened? ~  Weight 11/13 = 225# and BMI= 40  HYPERCALCEMIA> this was most likely due to sarcoidosis w/ extensive splenic involvement;  Calcium was as hight as 13.7 and returned to normal after the splenectomy. ~  Labs 4/12 showed calcium = 11.1 & this is very disappointing... ~  Labs 7/12 showed Ca= 12.0, Phos= 4.0, ACE= 78 ~  Labs 8-10/12 showed Ca up to 13.4, then down to 10.3 on PRED20mg /d... ~  Labs 11/12 showed Ca= 9.8 on Pred 20-10 qod; we will continue to wean. ~  Labs 1/13 in [place] showed Ca= 9.7 range on Pred 10-5 Qod;  We decided to wean Pred to 5mg /d... ~  Labs 3/13 showed Ca=10.1 on Pred5mg /d; rec to decr to 5mg Qod... ~  Labs 6/13 showed Ca=11.0 on Pred5Qod; rec incr to 10mg /d... ~  Labs 8/13 showed Ca=10.0 on Pred10/d; rec to decr to 5mg /d... ~  Labs 9/13 showed Ca=9.1 on Pred5mg /d; rec to decr to 5mg Qod...  GERD - on NEXIUM 40mg  Bid... w/ increased reflux symptoms w/ noct cough etc...  ~  2/10: discussed optimal Rx w/ Nex before dinner, Zantac300 + Reglan10 at bed, elev HOB, etc. ~  2/11: she stopped the Reglan, still using Nexium/ Zantac but w/ persist symptoms>  refer to GI for eval. ~  6/11: GI f/u DrPerry w/ EGD that was normal... continue Rx. ~  12/11:  increased GI symptoms post hosp> she will f/u w/ DrPerry for his input> incr Nexium Bid.  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 ~  6/11:  f/u colonoscopy by DrPerry showed divertics, 4 polyps, AVM.Marland KitchenMarland Kitchen path= tubular adenoma, f/u 78yrs.  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 ~  Note: Serum Calcium ret to normal immediately after spleen removed...  RENAL INSUFFICIENCY (ICD-588.9) >> Creat ~ 2.0 & eval 10/11 by DrFox- prob due to DM & HBP... ~  Labs 4/12 showed BUN= 27, Creat= 1.6 ~  Labs 7/12 showed BUN= 44, Creat= 1.9 (not on diuretics/ NSAIDs/ etc)... ~  Labs showed Creat up to 2.3 when Ca=13.4; now improved to 1.8 w/ Ca coming down... ~  Labs 11/12 showed BUN=38, Creat=1.7; rec to maintain hydration... ~  Labs 1/13 in [place] w/ diuresis showed Creat incr to 2.4 but ret to 1.7 w/ adjustment in meds... ~  Labs 3/13 showed BUN=38, Creat= 1.6 ~  Labs 6/13 showed BUN=41, Creat= 2.3; rec> stop Lasix20 for now, liberalize fluids. ~  Labs 9/13 showed BUN=40, Creat=2.1 on Lasix20, rec to continue the same.  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 on LYRICA 100mg  Qhs, off prev Tramadol Rx... ~  2010 eval by DrHiatt @ [facility] on Tramadol 50mg , Lyrica 75mg Bid...  GOUT, UNSPECIFIED (ICD-274.9) - Uric in the 7-8 range...on ALLOPURINOL 300mg /d for prevention...  ANXIETY (ICD-300.00) - she is under mod stress- work, mother died, hospice counselling, etc... ~  8/11:  rec starting Alprazolam 0.5mg  Tid for palpit, SOB, anxiety...  ANEMIA-NOS & MGUS >> see eval 10-11/11 by DrGranfortuna w/ bone marrow 11/11 in hosp= neg. ~  Labs 4/12 showed Hg= 11.6, MCV= 84... On FeSO4 daily. ~  Labs 7/12 showed Hg= 12.8, MCV= 90, Fe= 45 (14%sat)... ~  Labs 1/13 in [place] showed Hg=10.4 but improved to 12.9 on post hosp check in office... ~  Labs 6/13 showed Hg= 12.4  PSORIASIS - eval and Rx per DrTafeen prev on Humira injections- stopped 9/11...   Past Surgical  History  Procedure Date  . Cholecystectomy   . Vesicovaginal fistula closure w/  total abdominal hysterectomy   . 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 Tonsillectomy   . Appendectomy   . Abdominal hysterectomy   . Breast reduction surgery 1982  . Open splenectomy [id number]    by DrRosenbower    Outpatient Encounter Prescriptions as of 06/21/2012  Medication Sig Dispense Refill  . albuterol (PROVENTIL HFA;VENTOLIN HFA) 108 (90 BASE) MCG/ACT inhaler Inhale 2 puffs into the lungs every 6 (six) hours as needed for wheezing or shortness of breath. For shortness of breath.  3.7 Inhaler  1  . albuterol (PROVENTIL HFA;VENTOLIN HFA) 108 (90 BASE) MCG/ACT inhaler Inhale 1-2 puffs into the lungs every 6 (six) hours as needed for wheezing.  1 Inhaler  0  . allopurinol (ZYLOPRIM) 300 MG tablet Take 300 mg by mouth daily.      Marland Kitchen amLODipine (NORVASC) 10 MG tablet Take 10 mg by mouth daily.      Marland Kitchen amoxicillin-clavulanate (AUGMENTIN) 875-125 MG per tablet Take 1 tablet by mouth 2 (two) times daily.  14 tablet  0  . aspirin EC 81 MG tablet Take 81 mg by mouth daily.      Marland Kitchen atorvastatin (LIPITOR) 20 MG tablet Take 20 mg by mouth daily.      . cloNIDine (CATAPRES) 0.1 MG tablet Take 0.1 mg by mouth 2 (two) times daily.      Marland Kitchen Dextromethorphan-Guaifenesin (TUSSIN DM PO) Take 1 tablet by mouth daily as needed. For cough.      . esomeprazole (NEXIUM) 40 MG capsule Take 40 mg by mouth 2 (two) times daily.      . ferrous sulfate 325 (65 FE) MG tablet Take 325 mg by mouth daily with breakfast.      . ferrous sulfate 325 (65 FE) MG tablet take 1 tablet by mouth once daily WITH A MEAL  30 tablet  6  . fluticasone (FLONASE) 50 MCG/ACT nasal spray Place 2 sprays into the nose at bedtime.  48 g  3  . Fluticasone-Salmeterol (ADVAIR DISKUS) 250-50 MCG/DOSE AEPB Inhale 1 puff into the lungs 2 (two) times daily.  60 each  0  . Fluticasone-Salmeterol (ADVAIR) 250-50 MCG/DOSE AEPB Inhale 1  puff into the lungs every 12 (twelve) hours.  60 each  0  . furosemide (LASIX) 20 MG tablet Take 20 mg by mouth daily.      Marland Kitchen glimepiride (AMARYL) 4 MG tablet Take 4 mg by mouth daily before breakfast.      . guaiFENesin (MUCINEX) 600 MG 12 hr tablet Take 1,200 mg by mouth 2 (two) times daily.      . hydrALAZINE (APRESOLINE) 10 MG tablet Take 10 mg by mouth 3 (three) times daily.      Marland Kitchen HYDROcodone-acetaminophen (NORCO/VICODIN) 5-325 MG per tablet Take 1 or 2 po Q 6hrs for pain  10 tablet  0  . insulin detemir (LEVEMIR) 100 UNIT/ML injection Inject 80 Units into the skin every morning.       . insulin glulisine (APIDRA SOLOSTAR) 100 UNIT/ML injection Inject into the skin 3 (three) times daily before meals. to be Used with  sliding scale as directed      .  JANUVIA 50 MG tablet take 1 tablet by mouth once daily  30 each  11  . ketorolac (ACULAR LS) 0.4 % SOLN Place 1 drop into both eyes 3 (three) times daily.       Marland Kitchen LORazepam (ATIVAN) 1 MG tablet Take 1 mg by mouth daily as needed. For anxiety.      . methocarbamol (ROBAXIN) 500 MG tablet Take 1 tablet (500 mg total) by mouth 3 (three) times daily.  30 tablet  0  . methylPREDNISolone (MEDROL, PAK,) 4 MG tablet follow package directions  21 tablet  0  . metoprolol tartrate (LOPRESSOR) 25 MG tablet Take 12.5 mg by mouth daily.      . polyethylene glycol (MIRALAX / GLYCOLAX) packet Take 17 g by mouth daily as needed.       . predniSONE (DELTASONE) 5 MG tablet Take 5 mg by mouth every other day.       . pregabalin (LYRICA) 100 MG capsule Take 100 mg by mouth every evening.      . sennosides-docusate sodium (SENOKOT-S) 8.6-50 MG tablet Take 1 tablet by mouth at bedtime.      . sitaGLIPtin (JANUVIA) 50 MG tablet Take 50 mg by mouth daily.        Allergies  Allergen Reactions  . Adhesive (Tape) Other (See Comments)    Blisters   . Avelox (Moxifloxacin Hcl In Nacl)     GI upset  . Codeine Other (See Comments)    "crazy"   . Guaifenesin Nausea  And Vomiting  . Latex Swelling  . Oxycodone Nausea And Vomiting    Current Medications, Allergies, Past Medical History, Past Surgical History, Family History, and Social History were reviewed in [employer] record.   Review of Systems         See HPI - all other systems neg except as noted... The patient complains of dyspnea on exertion.  The patient denies anorexia, fever, weight loss, weight gain, vision loss, decreased hearing, hoarseness, chest pain, syncope, peripheral edema, prolonged cough, headaches, hemoptysis, abdominal pain, melena, hematochezia, severe indigestion/heartburn, hematuria, incontinence, muscle weakness, suspicious skin lesions, transient blindness, difficulty walking, depression, unusual weight change, abnormal bleeding, enlarged lymph nodes, and angioedema.     Objective:   Physical Exam      WD, Overweight, 66 y/o BF in NAD...  GENERAL:  Alert & oriented; pleasant & cooperative... HEENT:  [place]/AT, EOM-wnl, PERRLA, EACs-clear, TMs-wnl, NOSE-clear, THROAT- clear & wnl... NECK:  Supple w/ fair ROM; no JVD; normal carotid impulses w/o bruits; palp thyroid, w/o nodules felt; no lymphadenopathy. CHEST:  Clear to P & A; without wheezes/ rales/ or rhonchi. HEART:  Regular Rhythm; gr 1/6 SEM, without rubs/ or gallops. ABDOMEN:  Scar from splenectomy surg; obese, soft, & nontender w/ panniculus; normal bowel sounds; no organomegaly or masses detected EXT: without deformities, mod arthritic changes, s/p bilat TKRs; no varicose veins/ +venous insuffic/ tr edema. NEURO:  CN's intact; no focal neuro deficits... DERM:  Psoriasis rash per DrTafeen...  RADIOLOGY DATA:  Reviewed in the EPIC EMR & discussed w/ the patient...  LABORATORY DATA:  Reviewed in the EPIC EMR & discussed w/ the patient...   Assessment & Plan:   ASTHMATIC BRONCHITS>  ER bumped her Pred to 40mg /d which negated our slow taper w/ monitoring of her Calcium levels; NEB Rx w/ albut  seemed to give the the max benefit & we need to wean down the Pred as much as poss; she is also hypoxemic w/ O2sat=84%  on RA (likely from V/Q mismatching);  We decided to Rx w/ HomeO2- 1L/min rest, 2L/min exercise for now; continue home meds + add NEBS Qid...  HBP>  we prev stopped Lasix due to incr Creat=2.3; then BP ok & Creat improved to1.6, but BNP was elev necessitation restart of Lasix==> now on 20mg /d w/ Cr=2.1 and BP controlled on her regimen...  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  SARCOIDOSIS>  Main manifestation has been her hypercalcemia & back to 11.0 w/ ACE up to 86 ([id number]); rec incr Pred to 10mg /d==> Improved w/ Ca=10.0 & then weaned further w/ Ca=9.1 on Pred 5mg /d... We will try to wean to 5mg Qod again...  Hypercalcemia>  Initially responded to splenectomy but her calcium level rose again over several months after the surg; we were forced to use Pred rx in this difficult diabetic; we had her see DrAltheimer for DM/ endocrine consult; calcium levels have responded nicely to Pred Rx but climbed back to 11.0 when Pred weaned to 5mg  Qod; therefore rec incr Pred back to 10mg /d==> as above calcium improved on Pred & we have slowly weaned the Pred further...  DM>  Control has been difficult;  Stressed diet/ exercise, & get wt down; DrA has her on Levemir & ApidraSS coverage...  Renal Insuffic>  Creat was up to 2.4 in [place] & then back to 1.6 but now back to 2.3 w/ Lasix at 20mg /d & BNP=104; after that we held the Lasix, then restarted 20mg  when the BNP jumped...  Other medical problems as noted...   Patient's Medications  New Prescriptions   ALBUTEROL (PROVENTIL) (2.5 MG/3ML) 0.083% NEBULIZER SOLUTION    Take 3 mLs (2.5 mg total) by nebulization 4 (four) times daily.   CHLORPHENIRAMINE-HYDROCODONE (TUSSIONEX PENNKINETIC ER)  10-8 MG/5ML LQCR    Take 5 mLs by mouth every 12 (twelve) hours.  Previous Medications   ALBUTEROL (PROVENTIL HFA;VENTOLIN HFA) 108 (90 BASE) MCG/ACT INHALER    Inhale 2 puffs into the lungs every 6 (six) hours as needed for wheezing or shortness of breath. For shortness of breath.   ALLOPURINOL (ZYLOPRIM) 300 MG TABLET    Take 300 mg by mouth daily.   AMLODIPINE (NORVASC) 10 MG TABLET    Take 10 mg by mouth daily.   ASPIRIN EC 81 MG TABLET    Take 81 mg by mouth daily.   ATORVASTATIN (LIPITOR) 20 MG TABLET    Take 20 mg by mouth daily.   CLONIDINE (CATAPRES) 0.1 MG TABLET    Take 0.1 mg by mouth 2 (two) times daily.   DEXTROMETHORPHAN-GUAIFENESIN (TUSSIN DM PO)    Take 1 tablet by mouth daily as needed. For cough.   ESOMEPRAZOLE (NEXIUM) 40 MG CAPSULE    Take 40 mg by mouth 2 (two) times daily.   FERROUS SULFATE 325 (65 FE) MG TABLET    take 1 tablet by mouth once daily WITH A MEAL   FLUTICASONE (FLONASE) 50 MCG/ACT NASAL SPRAY    Place 2 sprays into the nose at bedtime.   FLUTICASONE-SALMETEROL (ADVAIR DISKUS) 250-50 MCG/DOSE AEPB    Inhale 1 puff into the lungs 2 (two) times daily.   FUROSEMIDE (LASIX) 20 MG TABLET    Take 20 mg by mouth daily.   GLIMEPIRIDE (AMARYL) 4 MG TABLET    Take 4 mg by mouth  daily before breakfast.   GUAIFENESIN (MUCINEX) 600 MG 12 HR TABLET    Take 1,200 mg by mouth 2 (two) times daily.   HYDRALAZINE (APRESOLINE) 10 MG TABLET    Take 10 mg by mouth 3 (three) times daily.   HYDROCODONE-ACETAMINOPHEN (NORCO/VICODIN) 5-325 MG PER TABLET    Take 1 or 2 po Q 6hrs for pain   INSULIN DETEMIR (LEVEMIR) 100 UNIT/ML INJECTION    Inject 80 Units into the skin every morning.    INSULIN GLULISINE (APIDRA SOLOSTAR) 100 UNIT/ML INJECTION    Inject into the skin 3 (three) times daily before meals. to be Used with  sliding scale as directed   JANUVIA 50 MG TABLET    take 1 tablet by mouth once daily   KETOROLAC (ACULAR LS) 0.4 % SOLN    Place 1 drop into both eyes 3 (three) times  daily.    LORAZEPAM (ATIVAN) 1 MG TABLET    Take 1 mg by mouth daily as needed. For anxiety.   METHOCARBAMOL (ROBAXIN) 500 MG TABLET    Take 1 tablet (500 mg total) by mouth 3 (three) times daily.   METOPROLOL TARTRATE (LOPRESSOR) 25 MG TABLET    Take 12.5 mg by mouth daily.   PREDNISONE (DELTASONE) 5 MG TABLET    Take 5 mg by mouth every other day.    PREGABALIN (LYRICA) 100 MG CAPSULE    Take 100 mg by mouth every evening.   SENNOSIDES-DOCUSATE SODIUM (SENOKOT-S) 8.6-50 MG TABLET    Take 1 tablet by mouth at bedtime.   SITAGLIPTIN (JANUVIA) 50 MG TABLET    Take 50 mg by mouth daily.  Modified Medications   No medications on file  Discontinued Medications   ALBUTEROL (PROVENTIL HFA;VENTOLIN HFA) 108 (90 BASE) MCG/ACT INHALER    Inhale 1-2 puffs into the lungs every 6 (six) hours as needed for wheezing.   AMOXICILLIN-CLAVULANATE (AUGMENTIN) 875-125 MG PER TABLET    Take 1 tablet by mouth 2 (two) times daily.   FERROUS SULFATE 325 (65 FE) MG TABLET    Take 325 mg by mouth daily with breakfast.   FLUTICASONE-SALMETEROL (ADVAIR) 250-50 MCG/DOSE AEPB    Inhale 1 puff into the lungs every 12 (twelve) hours.   METHYLPREDNISOLONE (MEDROL, PAK,) 4 MG TABLET    follow package directions   POLYETHYLENE GLYCOL (MIRALAX / GLYCOLAX) PACKET    Take 17 g by mouth daily as needed.

## 2012-06-21 NOTE — Patient Instructions (Addendum)
Today we updated your med list in our EPIC system...    Continue your current medications the same...  We decided to add a NEBULIZER w/ Albuterol 2.5mg  4 times daily...    And TUSSIONEX 1 tsp twice daily for cough...  Due to the low oxygen we will need to start nasal oxygen at home-     One liter/min at rest & 2L/min w/ exercise...    Hopefully this will be only temporary Rx...  Rest at home 7 take it easy for now...  Let's plan a follow up visit in 3 weeks.Marland KitchenMarland Kitchen

## 2012-06-21 NOTE — Telephone Encounter (Signed)
Order sent to [facility].  

## 2012-06-22 ENCOUNTER — Encounter: Payer: Self-pay | Admitting: Pulmonary Disease

## 2012-06-27 ENCOUNTER — Ambulatory Visit: Payer: BC Managed Care – PPO | Admitting: Pulmonary Disease

## 2012-07-12 ENCOUNTER — Encounter: Payer: Self-pay | Admitting: *Deleted

## 2012-07-13 ENCOUNTER — Ambulatory Visit: Payer: BC Managed Care – PPO | Admitting: Pulmonary Disease

## 2012-07-21 ENCOUNTER — Emergency Department (HOSPITAL_COMMUNITY)
Admission: EM | Admit: 2012-07-21 | Discharge: 2012-07-21 | Disposition: A | Discharge status: Home / Self Care | Payer: Self-pay | Attending: Emergency Medicine | Admitting: Emergency Medicine

## 2012-07-21 ENCOUNTER — Emergency Department (HOSPITAL_COMMUNITY): Payer: Self-pay

## 2012-07-21 ENCOUNTER — Encounter (HOSPITAL_COMMUNITY): Payer: Self-pay

## 2012-07-21 DIAGNOSIS — R197 Diarrhea, unspecified: Secondary | ICD-10-CM | POA: Insufficient documentation

## 2012-07-21 DIAGNOSIS — Z862 Personal history of diseases of the blood and blood-forming organs and certain disorders involving the immune mechanism: Secondary | ICD-10-CM | POA: Insufficient documentation

## 2012-07-21 DIAGNOSIS — G4733 Obstructive sleep apnea (adult) (pediatric): Secondary | ICD-10-CM | POA: Insufficient documentation

## 2012-07-21 DIAGNOSIS — K219 Gastro-esophageal reflux disease without esophagitis: Secondary | ICD-10-CM | POA: Insufficient documentation

## 2012-07-21 DIAGNOSIS — E1100 Type 2 diabetes mellitus with hyperosmolarity without nonketotic hyperglycemic-hyperosmolar coma (NKHHC): Secondary | ICD-10-CM

## 2012-07-21 DIAGNOSIS — K5900 Constipation, unspecified: Secondary | ICD-10-CM | POA: Insufficient documentation

## 2012-07-21 DIAGNOSIS — IMO0002 Reserved for concepts with insufficient information to code with codable children: Secondary | ICD-10-CM | POA: Insufficient documentation

## 2012-07-21 DIAGNOSIS — E785 Hyperlipidemia, unspecified: Secondary | ICD-10-CM | POA: Insufficient documentation

## 2012-07-21 DIAGNOSIS — K2970 Gastritis, unspecified, without bleeding: Secondary | ICD-10-CM | POA: Insufficient documentation

## 2012-07-21 DIAGNOSIS — Z8639 Personal history of other endocrine, nutritional and metabolic disease: Secondary | ICD-10-CM | POA: Insufficient documentation

## 2012-07-21 DIAGNOSIS — E669 Obesity, unspecified: Secondary | ICD-10-CM | POA: Insufficient documentation

## 2012-07-21 DIAGNOSIS — Z794 Long term (current) use of insulin: Secondary | ICD-10-CM | POA: Insufficient documentation

## 2012-07-21 DIAGNOSIS — F411 Generalized anxiety disorder: Secondary | ICD-10-CM | POA: Insufficient documentation

## 2012-07-21 DIAGNOSIS — Z8719 Personal history of other diseases of the digestive system: Secondary | ICD-10-CM | POA: Insufficient documentation

## 2012-07-21 DIAGNOSIS — E1101 Type 2 diabetes mellitus with hyperosmolarity with coma: Secondary | ICD-10-CM | POA: Insufficient documentation

## 2012-07-21 DIAGNOSIS — R109 Unspecified abdominal pain: Secondary | ICD-10-CM

## 2012-07-21 DIAGNOSIS — M545 Low back pain, unspecified: Secondary | ICD-10-CM | POA: Insufficient documentation

## 2012-07-21 DIAGNOSIS — Z8709 Personal history of other diseases of the respiratory system: Secondary | ICD-10-CM | POA: Insufficient documentation

## 2012-07-21 DIAGNOSIS — E65 Localized adiposity: Secondary | ICD-10-CM | POA: Insufficient documentation

## 2012-07-21 DIAGNOSIS — J449 Chronic obstructive pulmonary disease, unspecified: Secondary | ICD-10-CM | POA: Insufficient documentation

## 2012-07-21 DIAGNOSIS — D649 Anemia, unspecified: Secondary | ICD-10-CM | POA: Insufficient documentation

## 2012-07-21 DIAGNOSIS — I10 Essential (primary) hypertension: Secondary | ICD-10-CM | POA: Insufficient documentation

## 2012-07-21 DIAGNOSIS — Z87448 Personal history of other diseases of urinary system: Secondary | ICD-10-CM | POA: Insufficient documentation

## 2012-07-21 DIAGNOSIS — Z8701 Personal history of pneumonia (recurrent): Secondary | ICD-10-CM | POA: Insufficient documentation

## 2012-07-21 DIAGNOSIS — I2510 Atherosclerotic heart disease of native coronary artery without angina pectoris: Secondary | ICD-10-CM | POA: Insufficient documentation

## 2012-07-21 DIAGNOSIS — R112 Nausea with vomiting, unspecified: Secondary | ICD-10-CM | POA: Insufficient documentation

## 2012-07-21 DIAGNOSIS — Z7982 Long term (current) use of aspirin: Secondary | ICD-10-CM | POA: Insufficient documentation

## 2012-07-21 DIAGNOSIS — Z791 Long term (current) use of non-steroidal anti-inflammatories (NSAID): Secondary | ICD-10-CM | POA: Insufficient documentation

## 2012-07-21 DIAGNOSIS — Z79899 Other long term (current) drug therapy: Secondary | ICD-10-CM | POA: Insufficient documentation

## 2012-07-21 DIAGNOSIS — J4489 Other specified chronic obstructive pulmonary disease: Secondary | ICD-10-CM | POA: Insufficient documentation

## 2012-07-21 LAB — COMPREHENSIVE METABOLIC PANEL
AST: 24 U/L (ref 0–37)
CO2: 23 mEq/L (ref 19–32)
Calcium: 10.5 mg/dL (ref 8.4–10.5)
Creatinine, Ser: 1.53 mg/dL — ABNORMAL HIGH (ref 0.50–1.10)
GFR calc non Af Amer: 35 mL/min — ABNORMAL LOW (ref >90)
Sodium: 137 mEq/L (ref 135–145)
Total Protein: 7.6 g/dL (ref 6.0–8.3)

## 2012-07-21 LAB — GLUCOSE, CAPILLARY
Glucose-Capillary: 367 mg/dL — ABNORMAL HIGH (ref 70–99)
Glucose-Capillary: 324 mg/dL — ABNORMAL HIGH (ref 70–99)

## 2012-07-21 LAB — POCT I-STAT 3, VENOUS BLOOD GAS (G3P V)
Acid-Base Excess: 1 mmol/L (ref 0.0–2.0)
pO2, Ven: 77 mmHg — ABNORMAL HIGH (ref 30.0–45.0)

## 2012-07-21 LAB — BASIC METABOLIC PANEL
Chloride: 102 mEq/L (ref 96–112)
GFR calc Af Amer: 43 mL/min — ABNORMAL LOW (ref >90)
Potassium: 3.7 mEq/L (ref 3.5–5.1)
Sodium: 137 mEq/L (ref 135–145)

## 2012-07-21 LAB — CBC WITH DIFFERENTIAL/PLATELET
Basophils Absolute: 0 10*3/uL (ref 0.0–0.1)
Basophils Relative: 0 % (ref 0–1)
Eosinophils Absolute: 0.1 10*3/uL (ref 0.0–0.7)
Eosinophils Relative: 1 % (ref 0–5)
HCT: 42.6 % (ref 36.0–46.0)
Lymphocytes Relative: 20 % (ref 12–46)
MCH: 28.8 pg (ref 26.0–34.0)
MCHC: 32.6 g/dL (ref 30.0–36.0)
MCV: 88.2 fL (ref 78.0–100.0)
Monocytes Absolute: 1.1 10*3/uL — ABNORMAL HIGH (ref 0.1–1.0)
Platelets: 507 10*3/uL — ABNORMAL HIGH (ref 150–400)
RDW: 16.8 % — ABNORMAL HIGH (ref 11.5–15.5)

## 2012-07-21 LAB — KETONES, QUALITATIVE: Acetone, Bld: NEGATIVE

## 2012-07-21 LAB — URINALYSIS, ROUTINE W REFLEX MICROSCOPIC
Glucose, UA: >1000 mg/dL — AB
Leukocytes, UA: NEGATIVE
Protein, ur: >300 mg/dL — AB
Specific Gravity, Urine: 1.016 (ref 1.005–1.030)
Urobilinogen, UA: 0.2 mg/dL (ref 0.0–1.0)

## 2012-07-21 LAB — URINE MICROSCOPIC-ADD ON

## 2012-07-21 LAB — TROPONIN I: Troponin I: <0.3 ng/mL (ref <0.30)

## 2012-07-21 MED ORDER — ONDANSETRON HCL 4 MG/2ML IJ SOLN
4.0000 mg | Freq: Once | INTRAMUSCULAR | Status: AC
  Administered 2012-07-21: 4 mg via INTRAVENOUS
  Filled 2012-07-21: qty 2

## 2012-07-21 MED ORDER — PROMETHAZINE HCL 25 MG PO TABS: 25.0000 mg | ORAL_TABLET | Freq: Four times a day (QID) | ORAL | Status: DC | PRN

## 2012-07-21 MED ORDER — HYDROMORPHONE HCL PF 1 MG/ML IJ SOLN
1.0000 mg | Freq: Once | INTRAMUSCULAR | Status: AC
  Administered 2012-07-21: 1 mg via INTRAVENOUS
  Filled 2012-07-21: qty 1

## 2012-07-21 MED ORDER — CEPHALEXIN 500 MG PO CAPS: 500.0000 mg | ORAL_CAPSULE | Freq: Four times a day (QID) | ORAL | Status: DC

## 2012-07-21 MED ORDER — INSULIN ASPART 100 UNIT/ML ~~LOC~~ SOLN
8.0000 [IU] | Freq: Once | SUBCUTANEOUS | Status: AC
  Administered 2012-07-21: 8 [IU] via SUBCUTANEOUS

## 2012-07-21 MED ORDER — SODIUM CHLORIDE 0.9 % IV BOLUS (SEPSIS)
1000.0000 mL | Freq: Once | INTRAVENOUS | Status: AC
Start: 2012-07-21 — End: 2012-07-21
  Administered 2012-07-21: 1000 mL via INTRAVENOUS

## 2012-07-21 MED ORDER — HYDROCODONE-ACETAMINOPHEN 5-325 MG PO TABS: 2.0000 | ORAL_TABLET | ORAL | Status: DC | PRN

## 2012-07-21 NOTE — ED Notes (Signed)
Report given to CDU RN. Family provided update

## 2012-07-21 NOTE — ED Notes (Signed)
Pt presents with 2 day h/o L sided abdominal pain, nausea and vomiting.  Pt denies any diarrhea, reports last bowel movement was yesterday.

## 2012-07-21 NOTE — ED Notes (Signed)
Pt reporting nausea while drinking contrast. Will obtain order for zofran from EDP

## 2012-07-21 NOTE — ED Provider Notes (Signed)
5:37 PM Assumed care of patient in the CDU.  Patient presented with a chief complaint of abdominal pain, nausea, and vomiting.  Plan is for the patient to have a CT ab/pelvis with contrast and to be discharged home if the CT is negative.  Reassessed patient.  She reports that her pain has improved at this time.  Patient currently drinking her contrast.  Patient alert and orientated x 3, Heart RRR, Lungs CTAB, Abdomen soft with mild generalized tenderness.  7:30 PM CT results are as follows: IMPRESSION:  1. Subcutaneous stranding anteriorly along the lower abdomen, query mild panniculitis. The appearance is bilaterally symmetric. 2. Sigmoid diverticulosis. 3. Calcification along the hamstring tendons with expansion of the left hamstring tendon, possibly from chronic injury.  4. Lumbar spondylosis and degenerative disc disease. 5. Chronic hypodense lesion in segment seven of the liver with some punctate peripheral calcifications, probably related to the patient's sarcoidosis. This lesion was not hypermetabolic on prior PET CT and has been relatively stable in appearance over the last several years. 6. Scattered foci of regenerative splenic tissue in the left upper quadrant.  Patient currently receiving IVF.    Patient reports that her pain and nausea are controlled at this time.  Patient tolerating PO liquids.  9:28 PM Repeat BMP shows an anion gap of 11.  Repeat Acetone negative.  Symptoms are controlled at this time.  Therefore, feel that patient can be discharged home.  Patient instructed to follow up with PCP.  Dr. Manus Gunning and patient are in agreement with the plan.  Pascal [place], PA-C 07/21/12 2358

## 2012-07-21 NOTE — ED Provider Notes (Signed)
History     CSN: [id number]  Arrival date & time 07/21/12  1153   First MD Initiated Contact with Patient 07/21/12 1459      Chief Complaint  Patient presents with  . Abdominal Pain    (Consider location/radiation/quality/duration/timing/severity/associated sxs/prior treatment) HPI Comments: Patient has a diffuse abdominal pain that started last night. Started on her left side of her abdomen since spread to involve the entire thing. Associated with nausea and vomiting. She reports having a "constipated" bowel movement yesterday. Denies any diarrhea or blood in the stools. She reports she's had pain in past.diagnosis was never been this bad. She has a previous cholecystectomy, appendectomy, hysterectomy, splenectomy. She denies any fever, vaginal urinary symptoms. She denies any chest pain or shortness of breath. Her chronic low back pain is at baseline.  The history is provided by the patient and a relative. The history is limited by the condition of the patient.    Past Medical History  Diagnosis Date  . Unspecified essential hypertension   . CAD (coronary artery disease)   . Gastritis   . Asthma   . OSA (obstructive sleep apnea)   . Esophageal reflux   . Obesity, unspecified   . Degenerative joint disease   . Anxiety state, unspecified   . Hypercalcemia   . Anemia   . Other psoriasis   . Diverticulosis   . Sarcoidosis   . Diabetes mellitus   . Kidney disease   . Dyslipidemia   . PNA (pneumonia)     With pleural effusion, requiring thoracentesis  . Pleural effusion   . COPD (chronic obstructive pulmonary disease)   . Gout     Past Surgical History  Procedure Date  . Cholecystectomy   . Vesicovaginal fistula closure w/  total abdominal hysterectomy   . 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 Tonsillectomy   . Appendectomy   . Abdominal hysterectomy   . Breast reduction surgery 1982  . 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 Diabetes Mother   . Breast cancer Maternal Aunt     History  Substance Use Topics  . Smoking status: Never Smoker   . Smokeless tobacco: Never Used  . Alcohol Use: No    OB History    Grav Para Term Preterm Abortions TAB SAB Ect Mult Living                  Review of Systems  Constitutional: Negative for fever, activity change and appetite change.  Respiratory: Negative for cough, chest tightness and shortness of breath.   Cardiovascular: Negative for chest pain.  Gastrointestinal: Positive for nausea, vomiting, abdominal pain, diarrhea and constipation.  Genitourinary: Negative for dysuria and hematuria.  Musculoskeletal: Positive for back pain.  Neurological: Negative for dizziness and headaches.  A complete 10 system review of systems was obtained and all systems are negative except as noted in the HPI and PMH.    Allergies  Adhesive; Avelox; Codeine; Guaifenesin; Latex; and Oxycodone  Home Medications   Current Outpatient Rx  Name  Route  Sig  Dispense  Refill  . ALBUTEROL SULFATE HFA 108 (90 BASE) MCG/ACT IN AERS   Inhalation   Inhale 2 puffs into the lungs every 6 (six) hours as needed. For shortness of breath.         . ALBUTEROL SULFATE (2.5 MG/3ML) 0.083% IN NEBU  Nebulization   Take 2.5 mg by nebulization 4 (four) times daily. Shortness of breath         . ALLOPURINOL 300 MG PO TABS   Oral   Take 300 mg by mouth daily.         Marland Kitchen AMLODIPINE BESYLATE 10 MG PO TABS   Oral   Take 10 mg by mouth daily.         . ASPIRIN EC 81 MG PO TBEC   Oral   Take 81 mg by mouth daily.         . ATORVASTATIN CALCIUM 20 MG PO TABS   Oral   Take 20 mg by mouth daily.         Marland Kitchen CLONIDINE HCL 0.1 MG PO TABS   Oral   Take 0.1 mg by mouth 2 (two) times daily.         Marland Kitchen ESOMEPRAZOLE MAGNESIUM 40 MG PO CPDR   Oral   Take 40 mg by mouth 2 (two) times daily.         Marland Kitchen FERROUS SULFATE 325 (65 FE)  MG PO TABS   Oral   Take 325 mg by mouth daily with breakfast.         . FLUTICASONE PROPIONATE 50 MCG/ACT NA SUSP   Nasal   Place 2 sprays into the nose at bedtime.   48 g   3   . FLUTICASONE-SALMETEROL 250-50 MCG/DOSE IN AEPB   Inhalation   Inhale 1 puff into the lungs 2 (two) times daily.         . FUROSEMIDE 20 MG PO TABS   Oral   Take 20 mg by mouth daily.         Marland Kitchen GLIMEPIRIDE 4 MG PO TABS   Oral   Take 4 mg by mouth daily before breakfast.         . HYDRALAZINE HCL 10 MG PO TABS   Oral   Take 10 mg by mouth 3 (three) times daily.         Marland Kitchen HYDROCODONE-ACETAMINOPHEN 5-325 MG PO TABS   Oral   Take 1 tablet by mouth every 6 (six) hours as needed. for pain         . INSULIN DETEMIR 100 UNIT/ML [place] SOLN   Subcutaneous   Inject 80 Units into the skin every morning.          . INSULIN GLULISINE 100 UNIT/ML [place] SOLN   Subcutaneous   Inject 10-20 Units into the skin 3 (three) times daily before meals. to be Used with  sliding scale as directed         . KETOROLAC TROMETHAMINE 0.4 % OP SOLN - NO CHARGE   Both Eyes   Place 1 drop into both eyes 3 (three) times daily.          Marland Kitchen LORAZEPAM 1 MG PO TABS   Oral   Take 1 mg by mouth daily as needed. For anxiety.         Marland Kitchen METOPROLOL TARTRATE 25 MG PO TABS   Oral   Take 12.5 mg by mouth daily.         Marland Kitchen PREDNISONE 5 MG PO TABS   Oral   Take 5 mg by mouth every other day.          Marland Kitchen PREGABALIN 100 MG PO CAPS   Oral   Take 100 mg by mouth every evening.         Marland Kitchen  SENNA-DOCUSATE SODIUM 8.6-50 MG PO TABS   Oral   Take 1 tablet by mouth at bedtime.         Marland Kitchen SITAGLIPTIN PHOSPHATE 50 MG PO TABS   Oral   Take 50 mg by mouth daily.         Marland Kitchen HYDROCOD POLST-CPM POLST ER 10-8 MG/5ML PO LQCR   Oral   Take 5 mLs by mouth every 12 (twelve) hours. For cough           BP 164/103  Pulse 78  Resp 20  SpO2 100%  Physical Exam  Constitutional: She is oriented to person, place, and time.  She appears well-developed and well-nourished.       Uncomfortable  HENT:  Head: Normocephalic and atraumatic.  Mouth/Throat: Oropharynx is clear and moist.  Eyes: Conjunctivae normal and EOM are normal. Pupils are equal, round, and reactive to light.  Neck: Normal range of motion. Neck supple.  Cardiovascular: Normal rate, regular rhythm, normal heart sounds and intact distal pulses.        +2 femoral, DP, PT pulses bilaterally  Pulmonary/Chest: Breath sounds normal. No respiratory distress.  Abdominal: Bowel sounds are normal. There is tenderness. There is no rebound and no guarding.       Diffuse abdominal tenderness, worse in the left lower quadrant, no guarding or rebound  Musculoskeletal: Normal range of motion. She exhibits no edema and no tenderness.  Neurological: She is alert and oriented to person, place, and time. No cranial nerve deficit. She exhibits normal muscle tone. Coordination normal.  Skin: Skin is warm.    ED Course  Procedures (including critical care time)  Labs Reviewed  CBC WITH DIFFERENTIAL - Abnormal; Notable for the following:    WBC 12.8 (*)     RDW 16.8 (*)     Platelets 507 (*)     Neutro Abs 9.0 (*)     Monocytes Absolute 1.1 (*)     All other components within normal limits  COMPREHENSIVE METABOLIC PANEL - Abnormal; Notable for the following:    Glucose, Bld 350 (*)     Creatinine, Ser 1.53 (*)     Alkaline Phosphatase 158 (*)     GFR calc non Af Amer 35 (*)     GFR calc Af Amer 40 (*)     All other components within normal limits  LIPASE, BLOOD  LACTIC ACID, PLASMA  TROPONIN I  URINALYSIS, ROUTINE W REFLEX MICROSCOPIC   No results found.   No diagnosis found.    MDM  Diffuse abdominal pain with nausea and vomiting. Vital stable, appears uncomfortable, abdomen soft and nonsurgical.  Mild leukocytosis but improved from previous. LFTs and lipase unremarkable. Hyperglycemia without DKA. AG 16.  We will obtain CT scan to rule out  diverticulitis. Continue IV fluids were elevated and And nonketotic hyperglycemia.  D/w PA Anne Shutter in CDU.   Date: 07/21/2012  Rate: 97  Rhythm: normal sinus rhythm  QRS Axis: normal  Intervals: normal  ST/T Wave abnormalities: normal  Conduction Disutrbances:none  Narrative Interpretation:   Old EKG Reviewed: unchanged    Glynn Octave, MD 07/21/12 2218

## 2012-07-21 NOTE — ED Notes (Signed)
Spoke with Lawson Fiscal in CT and informed that the pt has finished drinking her contrast

## 2012-07-22 NOTE — ED Provider Notes (Signed)
Medical screening examination/treatment/procedure(s) were performed by non-physician practitioner and as supervising physician I was immediately available for consultation/collaboration.   Glynn Octave, MD 07/22/12 [phone]

## 2012-08-14 ENCOUNTER — Ambulatory Visit: Payer: BC Managed Care – PPO | Admitting: Pulmonary Disease

## 2012-08-23 ENCOUNTER — Other Ambulatory Visit: Payer: Self-pay | Admitting: Pulmonary Disease

## 2012-10-02 ENCOUNTER — Telehealth: Payer: Self-pay | Admitting: Pulmonary Disease

## 2012-10-02 MED ORDER — AMOXICILLIN-POT CLAVULANATE 875-125 MG PO TABS: 1.0000 | ORAL_TABLET | Freq: Two times a day (BID) | ORAL | Status: DC

## 2012-10-02 NOTE — Telephone Encounter (Signed)
Called, spoke with pt. C/o cough with thick, green to white mucus x 2 wks.  Denies PND, congestion, increased SOB, wheezing, or chest tightness.  Does report feeling feverish at times with chills and sweats.  Taking mucinex bid.  Requesting further recs.  Dr. Kriste Basque, pls advise.  Thank you.  Last OV with SN 06/21/2012  [facility]  Allergies verified with pt: Allergies  Allergen Reactions  . Adhesive (Tape) Other (See Comments)    Blisters   . Avelox (Moxifloxacin Hcl In N[place])     GI upset  . Codeine Other (See Comments)    "crazy"   . Guaifenesin Nausea And Vomiting  . Latex Swelling  . Oxycodone Nausea And Vomiting

## 2012-10-02 NOTE — Telephone Encounter (Signed)
Per SN---increase the mucinex to 2 po bid with plenty of fluids and call in augmentin 875 mg  #14  1 po bid.  i called and spoke with pt and she is aware of SN recs and nothing further is needed.

## 2012-10-05 ENCOUNTER — Ambulatory Visit: Payer: BC Managed Care – PPO | Admitting: Pulmonary Disease

## 2012-10-17 ENCOUNTER — Inpatient Hospital Stay (HOSPITAL_COMMUNITY)
Admission: EM | Admit: 2012-10-17 | Discharge: 2012-10-22 | DRG: 316 | Disposition: A | Discharge status: Home / Self Care | Payer: BC Managed Care – PPO | Attending: Internal Medicine | Admitting: Internal Medicine

## 2012-10-17 ENCOUNTER — Emergency Department (HOSPITAL_COMMUNITY): Payer: BC Managed Care – PPO

## 2012-10-17 ENCOUNTER — Encounter: Payer: Self-pay | Admitting: Adult Health

## 2012-10-17 ENCOUNTER — Encounter (HOSPITAL_COMMUNITY): Payer: Self-pay | Admitting: Cardiology

## 2012-10-17 ENCOUNTER — Other Ambulatory Visit (INDEPENDENT_AMBULATORY_CARE_PROVIDER_SITE_OTHER): Payer: BC Managed Care – PPO

## 2012-10-17 ENCOUNTER — Ambulatory Visit (INDEPENDENT_AMBULATORY_CARE_PROVIDER_SITE_OTHER): Payer: BC Managed Care – PPO | Admitting: Adult Health

## 2012-10-17 VITALS — BP 128/74 | HR 73 | Temp 97.5°F | Ht 62.5 in | Wt 198.0 lb

## 2012-10-17 DIAGNOSIS — K5730 Diverticulosis of large intestine without perforation or abscess without bleeding: Secondary | ICD-10-CM

## 2012-10-17 DIAGNOSIS — R05 Cough: Secondary | ICD-10-CM

## 2012-10-17 DIAGNOSIS — R198 Other specified symptoms and signs involving the digestive system and abdomen: Secondary | ICD-10-CM

## 2012-10-17 DIAGNOSIS — I5032 Chronic diastolic (congestive) heart failure: Secondary | ICD-10-CM

## 2012-10-17 DIAGNOSIS — J99 Respiratory disorders in diseases classified elsewhere: Secondary | ICD-10-CM | POA: Diagnosis present

## 2012-10-17 DIAGNOSIS — I10 Essential (primary) hypertension: Secondary | ICD-10-CM

## 2012-10-17 DIAGNOSIS — Z96649 Presence of unspecified artificial hip joint: Secondary | ICD-10-CM

## 2012-10-17 DIAGNOSIS — R112 Nausea with vomiting, unspecified: Secondary | ICD-10-CM

## 2012-10-17 DIAGNOSIS — I679 Cerebrovascular disease, unspecified: Secondary | ICD-10-CM

## 2012-10-17 DIAGNOSIS — Z794 Long term (current) use of insulin: Secondary | ICD-10-CM

## 2012-10-17 DIAGNOSIS — G8929 Other chronic pain: Secondary | ICD-10-CM | POA: Diagnosis present

## 2012-10-17 DIAGNOSIS — I472 Ventricular tachycardia, unspecified: Secondary | ICD-10-CM | POA: Diagnosis not present

## 2012-10-17 DIAGNOSIS — N179 Acute kidney failure, unspecified: Secondary | ICD-10-CM

## 2012-10-17 DIAGNOSIS — E119 Type 2 diabetes mellitus without complications: Secondary | ICD-10-CM

## 2012-10-17 DIAGNOSIS — I498 Other specified cardiac arrhythmias: Secondary | ICD-10-CM | POA: Diagnosis not present

## 2012-10-17 DIAGNOSIS — J449 Chronic obstructive pulmonary disease, unspecified: Secondary | ICD-10-CM | POA: Diagnosis present

## 2012-10-17 DIAGNOSIS — K859 Acute pancreatitis without necrosis or infection, unspecified: Secondary | ICD-10-CM

## 2012-10-17 DIAGNOSIS — N259 Disorder resulting from impaired renal tubular function, unspecified: Secondary | ICD-10-CM

## 2012-10-17 DIAGNOSIS — Q2733 Arteriovenous malformation of digestive system vessel: Secondary | ICD-10-CM

## 2012-10-17 DIAGNOSIS — I4729 Other ventricular tachycardia: Secondary | ICD-10-CM | POA: Clinically undetermined

## 2012-10-17 DIAGNOSIS — I2510 Atherosclerotic heart disease of native coronary artery without angina pectoris: Secondary | ICD-10-CM | POA: Diagnosis present

## 2012-10-17 DIAGNOSIS — IMO0001 Reserved for inherently not codable concepts without codable children: Secondary | ICD-10-CM | POA: Diagnosis present

## 2012-10-17 DIAGNOSIS — D649 Anemia, unspecified: Secondary | ICD-10-CM

## 2012-10-17 DIAGNOSIS — R1319 Other dysphagia: Secondary | ICD-10-CM

## 2012-10-17 DIAGNOSIS — G4730 Sleep apnea, unspecified: Secondary | ICD-10-CM

## 2012-10-17 DIAGNOSIS — I129 Hypertensive chronic kidney disease with stage 1 through stage 4 chronic kidney disease, or unspecified chronic kidney disease: Secondary | ICD-10-CM | POA: Diagnosis present

## 2012-10-17 DIAGNOSIS — D472 Monoclonal gammopathy: Secondary | ICD-10-CM

## 2012-10-17 DIAGNOSIS — J45909 Unspecified asthma, uncomplicated: Secondary | ICD-10-CM | POA: Diagnosis present

## 2012-10-17 DIAGNOSIS — J4489 Other specified chronic obstructive pulmonary disease: Secondary | ICD-10-CM | POA: Diagnosis present

## 2012-10-17 DIAGNOSIS — K5900 Constipation, unspecified: Secondary | ICD-10-CM | POA: Diagnosis present

## 2012-10-17 DIAGNOSIS — R011 Cardiac murmur, unspecified: Secondary | ICD-10-CM

## 2012-10-17 DIAGNOSIS — M549 Dorsalgia, unspecified: Secondary | ICD-10-CM

## 2012-10-17 DIAGNOSIS — D7389 Other diseases of spleen: Secondary | ICD-10-CM

## 2012-10-17 DIAGNOSIS — J40 Bronchitis, not specified as acute or chronic: Secondary | ICD-10-CM

## 2012-10-17 DIAGNOSIS — N289 Disorder of kidney and ureter, unspecified: Secondary | ICD-10-CM

## 2012-10-17 DIAGNOSIS — M1990 Unspecified osteoarthritis, unspecified site: Secondary | ICD-10-CM | POA: Diagnosis present

## 2012-10-17 DIAGNOSIS — K219 Gastro-esophageal reflux disease without esophagitis: Secondary | ICD-10-CM | POA: Diagnosis present

## 2012-10-17 DIAGNOSIS — L408 Other psoriasis: Secondary | ICD-10-CM | POA: Diagnosis present

## 2012-10-17 DIAGNOSIS — E785 Hyperlipidemia, unspecified: Secondary | ICD-10-CM | POA: Diagnosis present

## 2012-10-17 DIAGNOSIS — E7800 Pure hypercholesterolemia, unspecified: Secondary | ICD-10-CM | POA: Diagnosis present

## 2012-10-17 DIAGNOSIS — IMO0002 Reserved for concepts with insufficient information to code with codable children: Secondary | ICD-10-CM

## 2012-10-17 DIAGNOSIS — N189 Chronic kidney disease, unspecified: Secondary | ICD-10-CM

## 2012-10-17 DIAGNOSIS — R42 Dizziness and giddiness: Secondary | ICD-10-CM

## 2012-10-17 DIAGNOSIS — D869 Sarcoidosis, unspecified: Secondary | ICD-10-CM | POA: Diagnosis present

## 2012-10-17 DIAGNOSIS — N183 Chronic kidney disease, stage 3 unspecified: Secondary | ICD-10-CM | POA: Diagnosis present

## 2012-10-17 DIAGNOSIS — D126 Benign neoplasm of colon, unspecified: Secondary | ICD-10-CM

## 2012-10-17 DIAGNOSIS — Z7982 Long term (current) use of aspirin: Secondary | ICD-10-CM

## 2012-10-17 DIAGNOSIS — M109 Gout, unspecified: Secondary | ICD-10-CM

## 2012-10-17 DIAGNOSIS — J209 Acute bronchitis, unspecified: Secondary | ICD-10-CM

## 2012-10-17 DIAGNOSIS — K2970 Gastritis, unspecified, without bleeding: Secondary | ICD-10-CM

## 2012-10-17 DIAGNOSIS — F411 Generalized anxiety disorder: Secondary | ICD-10-CM | POA: Diagnosis present

## 2012-10-17 DIAGNOSIS — G4733 Obstructive sleep apnea (adult) (pediatric): Secondary | ICD-10-CM | POA: Diagnosis present

## 2012-10-17 DIAGNOSIS — E669 Obesity, unspecified: Secondary | ICD-10-CM | POA: Diagnosis present

## 2012-10-17 DIAGNOSIS — Z79899 Other long term (current) drug therapy: Secondary | ICD-10-CM

## 2012-10-17 DIAGNOSIS — R001 Bradycardia, unspecified: Secondary | ICD-10-CM

## 2012-10-17 DIAGNOSIS — R1084 Generalized abdominal pain: Secondary | ICD-10-CM

## 2012-10-17 LAB — CBC
HCT: 37.7 % (ref 36.0–46.0)
MCH: 30.6 pg (ref 26.0–34.0)
MCV: 88.7 fL (ref 78.0–100.0)
Platelets: 452 10*3/uL — ABNORMAL HIGH (ref 150–400)
RDW: 16.8 % — ABNORMAL HIGH (ref 11.5–15.5)

## 2012-10-17 LAB — CBC WITH DIFFERENTIAL/PLATELET
Basophils Absolute: 0 10*3/uL (ref 0.0–0.1)
Basophils Relative: 0.1 % (ref 0.0–3.0)
Eosinophils Absolute: 0.6 10*3/uL (ref 0.0–0.7)
HCT: 39.1 % (ref 36.0–46.0)
Hemoglobin: 12.9 g/dL (ref 12.0–15.0)
Lymphs Abs: 2.1 10*3/uL (ref 0.7–4.0)
MCHC: 33 g/dL (ref 30.0–36.0)
MCV: 91.7 fl (ref 78.0–100.0)
Monocytes Absolute: 1.8 10*3/uL — ABNORMAL HIGH (ref 0.1–1.0)
Neutro Abs: 10.8 10*3/uL — ABNORMAL HIGH (ref 1.4–7.7)
RDW: 18.6 % — ABNORMAL HIGH (ref 11.5–14.6)

## 2012-10-17 LAB — HEPATIC FUNCTION PANEL
Albumin: 2.9 g/dL — ABNORMAL LOW (ref 3.5–5.2)
Alkaline Phosphatase: 159 U/L — ABNORMAL HIGH (ref 39–117)
Bilirubin, Direct: 0.1 mg/dL (ref 0.0–0.3)
Total Bilirubin: 0.3 mg/dL (ref 0.3–1.2)

## 2012-10-17 LAB — GLUCOSE, CAPILLARY: Glucose-Capillary: 172 mg/dL — ABNORMAL HIGH (ref 70–99)

## 2012-10-17 LAB — URINALYSIS, ROUTINE W REFLEX MICROSCOPIC
Bilirubin Urine: NEGATIVE
Nitrite: NEGATIVE
Specific Gravity, Urine: 1.012 (ref 1.005–1.030)
Urobilinogen, UA: 0.2 mg/dL (ref 0.0–1.0)
pH: 5.5 (ref 5.0–8.0)

## 2012-10-17 LAB — URINE MICROSCOPIC-ADD ON

## 2012-10-17 LAB — BASIC METABOLIC PANEL
BUN: 49 mg/dL — ABNORMAL HIGH (ref 6–23)
Calcium: 14.5 mg/dL (ref 8.4–10.5)
Creatinine, Ser: 4 mg/dL — ABNORMAL HIGH (ref 0.4–1.2)

## 2012-10-17 LAB — TSH: TSH: 2.77 u[IU]/mL (ref 0.35–5.50)

## 2012-10-17 MED ORDER — INSULIN DETEMIR 100 UNIT/ML ~~LOC~~ SOLN
40.0000 [IU] | Freq: Every morning | SUBCUTANEOUS | Status: DC
  Administered 2012-10-18: 40 [IU] via SUBCUTANEOUS
  Filled 2012-10-17: qty 0.4

## 2012-10-17 MED ORDER — ASPIRIN EC 81 MG PO TBEC
81.0000 mg | DELAYED_RELEASE_TABLET | Freq: Every day | ORAL | Status: DC
  Administered 2012-10-17 – 2012-10-22 (×6): 81 mg via ORAL
  Filled 2012-10-17 (×6): qty 1

## 2012-10-17 MED ORDER — GUAIFENESIN ER 600 MG PO TB12
1200.0000 mg | ORAL_TABLET | Freq: Every day | ORAL | Status: DC
  Administered 2012-10-18 – 2012-10-22 (×5): 1200 mg via ORAL
  Filled 2012-10-17 (×6): qty 2

## 2012-10-17 MED ORDER — SODIUM CHLORIDE 0.9 % IV BOLUS (SEPSIS): 1500.0000 mL | Freq: Once | INTRAVENOUS | Status: DC

## 2012-10-17 MED ORDER — KETOROLAC TROMETHAMINE 0.5 % OP SOLN
1.0000 [drp] | Freq: Three times a day (TID) | OPHTHALMIC | Status: DC
  Administered 2012-10-17 – 2012-10-22 (×14): 1 [drp] via OPHTHALMIC
  Filled 2012-10-17 (×2): qty 3

## 2012-10-17 MED ORDER — CALCITONIN (SALMON) 200 UNIT/ML IJ SOLN: 4.0000 [IU]/kg | Freq: Two times a day (BID) | INTRAMUSCULAR | Status: DC

## 2012-10-17 MED ORDER — CALCITONIN (SALMON) 200 UNIT/ML IJ SOLN
4.0000 [IU]/kg | Freq: Two times a day (BID) | INTRAMUSCULAR | Status: DC
  Administered 2012-10-17: 360 [IU] via INTRAMUSCULAR
  Administered 2012-10-18: 11:00:00 via INTRAMUSCULAR
  Administered 2012-10-18 – 2012-10-19 (×2): 360 [IU] via INTRAMUSCULAR
  Filled 2012-10-17 (×7): qty 1.8

## 2012-10-17 MED ORDER — ALBUTEROL SULFATE (5 MG/ML) 0.5% IN NEBU
2.5000 mg | INHALATION_SOLUTION | Freq: Four times a day (QID) | RESPIRATORY_TRACT | Status: DC
  Administered 2012-10-17: 2.5 mg via RESPIRATORY_TRACT
  Filled 2012-10-17: qty 0.5

## 2012-10-17 MED ORDER — SODIUM CHLORIDE 0.9 % IJ SOLN
3.0000 mL | Freq: Two times a day (BID) | INTRAMUSCULAR | Status: DC
  Administered 2012-10-18 – 2012-10-22 (×5): 3 mL via INTRAVENOUS

## 2012-10-17 MED ORDER — SODIUM CHLORIDE 0.9 % IV BOLUS (SEPSIS): 1000.0000 mL | Freq: Once | INTRAVENOUS | Status: DC

## 2012-10-17 MED ORDER — ALUM & MAG HYDROXIDE-SIMETH 200-200-20 MG/5ML PO SUSP
30.0000 mL | Freq: Four times a day (QID) | ORAL | Status: DC | PRN
  Filled 2012-10-17: qty 30

## 2012-10-17 MED ORDER — CLONIDINE HCL 0.1 MG PO TABS
0.1000 mg | ORAL_TABLET | Freq: Two times a day (BID) | ORAL | Status: DC
  Administered 2012-10-17 – 2012-10-20 (×7): 0.1 mg via ORAL
  Filled 2012-10-17 (×10): qty 1

## 2012-10-17 MED ORDER — SODIUM CHLORIDE 0.9 % IV SOLN: INTRAVENOUS | Status: DC

## 2012-10-17 MED ORDER — FERROUS SULFATE 325 (65 FE) MG PO TABS
325.0000 mg | ORAL_TABLET | Freq: Every day | ORAL | Status: DC
  Administered 2012-10-18 – 2012-10-22 (×5): 325 mg via ORAL
  Filled 2012-10-17 (×6): qty 1

## 2012-10-17 MED ORDER — INSULIN ASPART 100 UNIT/ML ~~LOC~~ SOLN
0.0000 [IU] | Freq: Three times a day (TID) | SUBCUTANEOUS | Status: DC
  Administered 2012-10-18: 08:00:00 via SUBCUTANEOUS
  Administered 2012-10-18: 15 [IU] via SUBCUTANEOUS

## 2012-10-17 MED ORDER — ONDANSETRON HCL 4 MG PO TABS: 4.0000 mg | ORAL_TABLET | Freq: Four times a day (QID) | ORAL | Status: DC | PRN

## 2012-10-17 MED ORDER — METHYLPREDNISOLONE SODIUM SUCC 125 MG IJ SOLR
80.0000 mg | Freq: Every day | INTRAMUSCULAR | Status: DC
  Administered 2012-10-17 – 2012-10-18 (×2): 80 mg via INTRAVENOUS
  Filled 2012-10-17 (×3): qty 1.28

## 2012-10-17 MED ORDER — METOPROLOL TARTRATE 12.5 MG HALF TABLET
12.5000 mg | ORAL_TABLET | Freq: Every day | ORAL | Status: DC
  Administered 2012-10-17 – 2012-10-18 (×2): 12.5 mg via ORAL
  Filled 2012-10-17 (×2): qty 1

## 2012-10-17 MED ORDER — PANTOPRAZOLE SODIUM 40 MG PO TBEC
40.0000 mg | DELAYED_RELEASE_TABLET | Freq: Every day | ORAL | Status: DC
  Administered 2012-10-18 – 2012-10-22 (×5): 40 mg via ORAL
  Filled 2012-10-17 (×4): qty 1
  Filled 2012-10-17: qty 2

## 2012-10-17 MED ORDER — HEPARIN SODIUM (PORCINE) 5000 UNIT/ML IJ SOLN
5000.0000 [IU] | Freq: Three times a day (TID) | INTRAMUSCULAR | Status: DC
  Administered 2012-10-17 – 2012-10-22 (×14): 5000 [IU] via SUBCUTANEOUS
  Filled 2012-10-17 (×17): qty 1

## 2012-10-17 MED ORDER — DEXTROMETHORPHAN-GUAIFENESIN 10-100 MG/5ML PO SYRP: 5.0000 mL | ORAL_SOLUTION | Freq: Every day | ORAL | Status: DC | PRN

## 2012-10-17 MED ORDER — HYDROCODONE-ACETAMINOPHEN 5-325 MG PO TABS
1.0000 | ORAL_TABLET | ORAL | Status: DC | PRN
  Administered 2012-10-21: 2 via ORAL
  Filled 2012-10-17: qty 2

## 2012-10-17 MED ORDER — ALBUTEROL SULFATE (5 MG/ML) 0.5% IN NEBU: 2.5000 mg | INHALATION_SOLUTION | RESPIRATORY_TRACT | Status: DC | PRN

## 2012-10-17 MED ORDER — FLUTICASONE PROPIONATE 50 MCG/ACT NA SUSP
2.0000 | Freq: Every day | NASAL | Status: DC
Start: 2012-10-17 — End: 2012-10-22
  Administered 2012-10-17 – 2012-10-21 (×5): 2 via NASAL
  Filled 2012-10-17: qty 16

## 2012-10-17 MED ORDER — SODIUM CHLORIDE 0.9 % IV SOLN
INTRAVENOUS | Status: DC
  Administered 2012-10-17 – 2012-10-21 (×8): via INTRAVENOUS

## 2012-10-17 MED ORDER — FUROSEMIDE 10 MG/ML IJ SOLN
80.0000 mg | Freq: Three times a day (TID) | INTRAMUSCULAR | Status: DC
  Administered 2012-10-17 – 2012-10-18 (×4): 80 mg via INTRAVENOUS
  Filled 2012-10-17 (×8): qty 8

## 2012-10-17 MED ORDER — ACETAMINOPHEN 325 MG PO TABS: 650.0000 mg | ORAL_TABLET | Freq: Four times a day (QID) | ORAL | Status: DC | PRN

## 2012-10-17 MED ORDER — ACETAMINOPHEN 650 MG RE SUPP: 650.0000 mg | Freq: Four times a day (QID) | RECTAL | Status: DC | PRN

## 2012-10-17 MED ORDER — GUAIFENESIN-DM 100-10 MG/5ML PO SYRP
5.0000 mL | ORAL_SOLUTION | Freq: Every day | ORAL | Status: DC | PRN
  Filled 2012-10-17: qty 5

## 2012-10-17 MED ORDER — ONDANSETRON HCL 4 MG/2ML IJ SOLN
4.0000 mg | Freq: Four times a day (QID) | INTRAMUSCULAR | Status: DC | PRN
  Administered 2012-10-18 – 2012-10-22 (×3): 4 mg via INTRAVENOUS
  Filled 2012-10-17 (×3): qty 2

## 2012-10-17 MED ORDER — KETOROLAC TROMETHAMINE 0.4 % OP SOLN - NO CHARGE: 1.0000 [drp] | Freq: Three times a day (TID) | OPHTHALMIC | Status: DC

## 2012-10-17 MED ORDER — SODIUM CHLORIDE 0.9 % IV SOLN
1000.0000 mL | Freq: Once | INTRAVENOUS | Status: AC
  Administered 2012-10-17: 1000 mL via INTRAVENOUS

## 2012-10-17 MED ORDER — LORAZEPAM 1 MG PO TABS: 1.0000 mg | ORAL_TABLET | Freq: Three times a day (TID) | ORAL | Status: DC | PRN

## 2012-10-17 MED ORDER — POLYETHYLENE GLYCOL 3350 17 G PO PACK
17.0000 g | PACK | Freq: Two times a day (BID) | ORAL | Status: DC
  Administered 2012-10-17 – 2012-10-21 (×5): 17 g via ORAL
  Filled 2012-10-17 (×11): qty 1

## 2012-10-17 NOTE — ED Provider Notes (Signed)
History     CSN: [id number]  Arrival date & time 10/17/12  1347   First MD Initiated Contact with Patient 10/17/12 1454      Chief Complaint  Patient presents with  . Abnormal Lab   HPI   67 y/o F with PMHx significant for Sarcoidosis, Asthma, COPD, CAD, HTN, DM and Kidney Disease presents to ED after abnormal labs results found during Pulmonology Office Visit today (worsening Cr, Hypercalcemia and elevated WBC). Pt reports feeling tired and with progressive decrease in urine output during the day and overflow incontinence at night  for about 3 weeks. Pt reports nausea without vomiting and burning with urination as well. She also has not been taking any of her medications for the same period of time due to feeling unwell. She also reports new onset of Chalky-white sputum production and denies SOB, chills or fever. Pt also complaints constipation for 6 days but denies abdominal pain or discomfort.  She denies chest pain, palpitations, headaches, dizziness, numbness or weakness.   Past Medical History  Diagnosis Date  . Unspecified essential hypertension   . CAD (coronary artery disease)   . Gastritis   . Asthma   . OSA (obstructive sleep apnea)   . Esophageal reflux   . Obesity, unspecified   . Degenerative joint disease   . Anxiety state, unspecified   . Hypercalcemia   . Anemia   . Other psoriasis   . Diverticulosis   . Sarcoidosis   . Diabetes mellitus   . Kidney disease   . Dyslipidemia   . PNA (pneumonia)     With pleural effusion, requiring thoracentesis  . Pleural effusion   . COPD (chronic obstructive pulmonary disease)   . Gout     Past Surgical History  Procedure Laterality Date  . Cholecystectomy    . Vesicovaginal fistula closure w/  total abdominal hysterectomy    . Bilateral total knee replacements  Rt=5/04 & Lft=1/09    by DrAlusio  . Tonsillectomy    . Appendectomy    . Abdominal hysterectomy    . Breast reduction surgery  1982  . Open splenectomy   [id number]    by DrRosenbower    Family History  Problem Relation Age of Onset  . Diabetes Mother   . Breast cancer Maternal Aunt     History  Substance Use Topics  . Smoking status: Never Smoker   . Smokeless tobacco: Never Used  . Alcohol Use: No    OB History   Grav Para Term Preterm Abortions TAB SAB Ect Mult Living                  Review of Systems  Constitutional: Positive for fatigue.  HENT: Negative.   Respiratory: Positive for cough.   Cardiovascular: Negative.   Gastrointestinal: Positive for nausea and constipation. Negative for vomiting.  Endocrine: Negative.   Genitourinary: Positive for dysuria.  Musculoskeletal: Negative.   Skin: Positive for rash.  Neurological: Negative.   Psychiatric/Behavioral: Negative.     Allergies  Adhesive; Avelox; Codeine; Guaifenesin; Latex; and Oxycodone  Home Medications   Current Outpatient Rx  Name  Route  Sig  Dispense  Refill  . albuterol (PROVENTIL HFA;VENTOLIN HFA) 108 (90 BASE) MCG/ACT inhaler   Inhalation   Inhale 2 puffs into the lungs every 6 (six) hours as needed. For shortness of breath.         Marland Kitchen albuterol (PROVENTIL) (2.5 MG/3ML) 0.083% nebulizer solution   Nebulization   Take 2.5  mg by nebulization 4 (four) times daily. Shortness of breath         . allopurinol (ZYLOPRIM) 300 MG tablet   Oral   Take 300 mg by mouth daily.         Marland Kitchen amLODipine (NORVASC) 10 MG tablet   Oral   Take 10 mg by mouth daily.         Marland Kitchen amoxicillin-clavulanate (AUGMENTIN) 875-125 MG per tablet   Oral   Take 1 tablet by mouth 2 (two) times daily.   14 tablet   0   . aspirin EC 81 MG tablet   Oral   Take 81 mg by mouth daily.         Marland Kitchen atorvastatin (LIPITOR) 20 MG tablet   Oral   Take 20 mg by mouth daily.         . chlorpheniramine-HYDROcodone (TUSSIONEX) 10-8 MG/5ML LQCR   Oral   Take 5 mLs by mouth every 12 (twelve) hours. For cough         . cloNIDine (CATAPRES) 0.1 MG tablet   Oral   Take  0.1 mg by mouth 2 (two) times daily.         Marland Kitchen esomeprazole (NEXIUM) 40 MG capsule   Oral   Take 40 mg by mouth 2 (two) times daily.         . ferrous sulfate 325 (65 FE) MG tablet   Oral   Take 325 mg by mouth daily with breakfast.         . fluticasone (FLONASE) 50 MCG/ACT nasal spray   Nasal   Place 2 sprays into the nose at bedtime.   48 g   3   . Fluticasone-Salmeterol (ADVAIR) 250-50 MCG/DOSE AEPB   Inhalation   Inhale 1 puff into the lungs 2 (two) times daily.         . furosemide (LASIX) 20 MG tablet   Oral   Take 20 mg by mouth daily.         Marland Kitchen glimepiride (AMARYL) 4 MG tablet   Oral   Take 4 mg by mouth daily before breakfast.         . hydrALAZINE (APRESOLINE) 10 MG tablet   Oral   Take 10 mg by mouth 3 (three) times daily.         Marland Kitchen HYDROcodone-acetaminophen (NORCO/VICODIN) 5-325 MG per tablet   Oral   Take 1 tablet by mouth every 6 (six) hours as needed. for pain         . insulin detemir (LEVEMIR) 100 UNIT/ML injection   Subcutaneous   Inject 80 Units into the skin every morning.          . insulin glulisine (APIDRA SOLOSTAR) 100 UNIT/ML injection   Subcutaneous   Inject 10-20 Units into the skin 3 (three) times daily before meals. to be Used with  sliding scale as directed         . ketorolac (ACULAR LS) 0.4 % SOLN   Both Eyes   Place 1 drop into both eyes 3 (three) times daily.          Marland Kitchen LORazepam (ATIVAN) 1 MG tablet   Oral   Take 1 mg by mouth daily as needed. For anxiety.         . metoprolol tartrate (LOPRESSOR) 25 MG tablet      take 1/2 tablet by mouth once daily   30 tablet   3   . predniSONE (DELTASONE) 5 MG  tablet   Oral   Take 5 mg by mouth every other day.          . pregabalin (LYRICA) 100 MG capsule   Oral   Take 100 mg by mouth every evening.         . promethazine (PHENERGAN) 25 MG tablet   Oral   Take 1 tablet (25 mg total) by mouth every 6 (six) hours as needed for nausea.   20 tablet    0   . sennosides-docusate sodium (SENOKOT-S) 8.6-50 MG tablet   Oral   Take 1 tablet by mouth at bedtime.         . sitaGLIPtin (JANUVIA) 50 MG tablet   Oral   Take 50 mg by mouth daily.           BP 143/122  Pulse 60  Temp(Src) 97.5 F (36.4 C) (Oral)  Resp 26  SpO2 94%  Physical Exam Gen:  NAD HEENT: Moist mucous membranes. Neck supple. No JVD. No adenopathies.  CV: Regular rate and rhythm, no murmurs rubs or gallops PULM: Clear to auscultation bilaterally. No wheezes/rales/rhonchi ABD: Obese, soft, non tender, non distended, normal bowel sounds. No CVA tenderness.  EXT: No edema Neuro: Alert and oriented x3. No focalization  Psych: normal mood and affect. No agitation. Skin: Scaly leg lesions bilaterally consistent with Psoriasis.    ED Course  Procedures (including critical care time)  Labs Reviewed  URINALYSIS, ROUTINE W REFLEX MICROSCOPIC   No results found.   No diagnosis found.  MDM  NS 500 ml bolus EKG, CXR Admission to [facility]         Dayarmys Piloto de Criselda Peaches, MD 10/17/12 1528

## 2012-10-17 NOTE — Progress Notes (Signed)
Patient arrived to [id number]. Patient was placed on telemetry. Patient was stable upon arrival and stated no pain.

## 2012-10-17 NOTE — Patient Instructions (Addendum)
Go To [facility] ER for admission to hospital

## 2012-10-17 NOTE — ED Notes (Signed)
Labs noted in Epic from this morning.

## 2012-10-17 NOTE — ED Notes (Signed)
Patient transported to X-ray 

## 2012-10-17 NOTE — ED Provider Notes (Signed)
3:43 PM  Date: 10/17/2012  Rate:63  Rhythm: normal sinus rhythm  QRS Axis: normal  Intervals: normal  ST/T Wave abnormalities: normal  Conduction Disutrbances:none  Narrative Interpretation: Borderline EKG  Old EKG Reviewed: changes noted--QTC has normalized since tracing of 07/21/2012.    Carleene Cooper III, MD 10/17/12 [phone]

## 2012-10-17 NOTE — ED Notes (Signed)
Pt reports she went to her Endocrinologist on Monday and was told to come to her PCP for lab work. States she was told her BUN and creat were elevated. States she has been having some intermittent back pain and leg pain. Denies any chest pain or SOB. States she is here for further work-up related to her kidneys.

## 2012-10-17 NOTE — ED Provider Notes (Signed)
I saw and evaluated the patient, reviewed the resident's note and I agree with the findings and plan. 67 yo lady was seen at [facility] and was found to have renal insufficiency and high calcium.  Sent to [facility] ED for admission.  Lab workup reviewed with pt, and call to [facility] to admit her.    Carleene Cooper III, MD 10/17/12 [phone]

## 2012-10-17 NOTE — H&P (Signed)
PATIENT DETAILS Name: [name] Age: 67 y.o. Sex: female Date of Birth: 1945/12/22 Admit Date: 10/17/2012 ZOX:[id number],VWUJW M, MD   CHIEF COMPLAINT:  Referred from primary care practitioner's office for severe hypercalcemia and acute renal failure  HPI: Patient is a 67 year old African American female with a past medical history of diabetes, hypertension, bronchial asthma, obstructive sleep apnea intermittently compliant with CPAP therapy, chronic back pain for which she gets intermittent as steroid shots by Dr. 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 She has had frequent falls over the past 2 months because of generalized weakness and difficulty ambulating. She claims that she is slightly confused and has had increasing problems with short-term memory. She claims she has not had a bowel movement for the past 1 week. She has had occasional abdominal pain that she cannot describe well. She was seen at her endocrinologist office a few days ago and was told she was high, she was then seen by her primary care practitioner, calcium levels were over 14 creatinine was 4 and she was sent over to the emergency room for further evaluation. I was subsequently asked to admit this patient for further evaluation and treatment   ALLERGIES:   Allergies  Allergen Reactions  . Adhesive (Tape) Other (See Comments)    Blisters   . Avelox (Moxifloxacin Hcl In Nacl)     GI upset  . Codeine Other (See Comments)    "crazy"   . Guaifenesin Nausea And Vomiting  . Latex Swelling  . Oxycodone Nausea And Vomiting    PAST MEDICAL HISTORY: Past Medical History  Diagnosis Date  . Unspecified essential hypertension   . CAD (coronary  artery disease)   . Gastritis   . Asthma   . OSA (obstructive sleep apnea)   . Esophageal reflux   . Obesity, unspecified   . Degenerative joint disease   . Anxiety state, unspecified   . Hypercalcemia   . Anemia   . Other psoriasis   . Diverticulosis   . Sarcoidosis   . Diabetes mellitus   . Kidney disease   . Dyslipidemia   . PNA (pneumonia)     With pleural effusion, requiring thoracentesis  . Pleural effusion   . COPD (chronic obstructive pulmonary disease)   . Gout     PAST SURGICAL HISTORY: Past Surgical History  Procedure Laterality Date  . Cholecystectomy    . Vesicovaginal fistula closure w/  total abdominal hysterectomy    . Bilateral total knee replacements  Rt=5/04 & Lft=1/09    by DrAlusio  . Tonsillectomy    . Appendectomy    . Abdominal hysterectomy    . Breast reduction surgery  1982  . Open splenectomy  [id number]    by DrRosenbower    MEDICATIONS AT HOME: Prior to Admission medications   Medication Sig Start Date End Date Taking? Authorizing Provider  albuterol (PROVENTIL HFA;VENTOLIN HFA) 108 (90 BASE) MCG/ACT inhaler Inhale 2 puffs into the lungs every 6 (six) hours as needed. For shortness of breath. 06/14/12  Yes Jimmie Molly, MD  albuterol (PROVENTIL) (2.5 MG/3ML) 0.083% nebulizer solution Take 2.5 mg by nebulization 4 (four) times daily. Shortness of breath 06/21/12  Yes Michele Mcalpine, MD  allopurinol (ZYLOPRIM) 300 MG tablet Take 300  mg by mouth daily.   Yes Historical Provider, MD  amLODipine (NORVASC) 10 MG tablet Take 10 mg by mouth daily.   Yes Historical Provider, MD  aspirin EC 81 MG tablet Take 81 mg by mouth daily.   Yes Historical Provider, MD  atorvastatin (LIPITOR) 20 MG tablet Take 20 mg by mouth daily.   Yes Historical Provider, MD  cloNIDine (CATAPRES) 0.1 MG tablet Take 0.1 mg by mouth 2 (two) times daily.   Yes Historical Provider, MD  Dextromethorphan-Guaifenesin (TUSSIN DM) 10-100 MG/5ML liquid Take 5 mLs by mouth daily as needed  (for cough).   Yes Historical Provider, MD  esomeprazole (NEXIUM) 40 MG capsule Take 40 mg by mouth 2 (two) times daily.   Yes Historical Provider, MD  ferrous sulfate 325 (65 FE) MG tablet Take 325 mg by mouth daily with breakfast.   Yes Historical Provider, MD  fluticasone (FLONASE) 50 MCG/ACT nasal spray Place 2 sprays into the nose at bedtime. 10/06/11  Yes Michele Mcalpine, MD  Fluticasone-Salmeterol (ADVAIR) 250-50 MCG/DOSE AEPB Inhale 1 puff into the lungs 2 (two) times daily. 06/14/12  Yes Jimmie Molly, MD  furosemide (LASIX) 20 MG tablet Take 20 mg by mouth daily.   Yes Historical Provider, MD  glimepiride (AMARYL) 4 MG tablet Take 4 mg by mouth daily before breakfast.   Yes Historical Provider, MD  glucose blood test strip 1 each by Other route as needed for other. Use as instructed   Yes Historical Provider, MD  guaiFENesin (MUCINEX) 600 MG 12 hr tablet Take 1,200 mg by mouth daily.   Yes Historical Provider, MD  hydrALAZINE (APRESOLINE) 10 MG tablet Take 10 mg by mouth 3 (three) times daily.   Yes Historical Provider, MD  insulin detemir (LEVEMIR) 100 UNIT/ML injection Inject 80 Units into the skin every morning.    Yes Historical Provider, MD  insulin glulisine (APIDRA SOLOSTAR) 100 UNIT/ML injection Inject 20-34 Units into the skin 3 (three) times daily before meals. Use 20 units before breakfast and 34 units before lunch and 30 units before dinner   Yes Historical Provider, MD  ketorolac (ACULAR LS) 0.4 % SOLN Place 1 drop into both eyes 3 (three) times daily.    Yes Historical Provider, MD  Lancets MISC 1 Units by Does not apply route 4 (four) times daily - after meals and at bedtime.   Yes Historical Provider, MD  LORazepam (ATIVAN) 1 MG tablet Take 1 mg by mouth daily as needed. For anxiety.   Yes Historical Provider, MD  metoprolol tartrate (LOPRESSOR) 25 MG tablet Take 12.5 mg by mouth daily.   Yes Historical Provider, MD  predniSONE (DELTASONE) 5 MG tablet Take 5 mg by mouth every other  day.    Yes Historical Provider, MD  pregabalin (LYRICA) 100 MG capsule Take 100 mg by mouth every evening.   Yes Historical Provider, MD  promethazine (PHENERGAN) 25 MG tablet Take 1 tablet (25 mg total) by mouth every 6 (six) hours as needed for nausea. 07/21/12  Yes Heather Zenaida Niece Wingen, PA-C  sennosides-docusate sodium (SENOKOT-S) 8.6-50 MG tablet Take 1 tablet by mouth at bedtime.   Yes Historical Provider, MD  sitaGLIPtin (JANUVIA) 50 MG tablet Take 50 mg by mouth daily.   Yes Historical Provider, MD    FAMILY HISTORY: Family History  Problem Relation Age of Onset  . Diabetes Mother   . Breast cancer Maternal Aunt     SOCIAL HISTORY:  reports that she has never smoked. She has never  used smokeless tobacco. She reports that she does not drink alcohol or use illicit drugs.  REVIEW OF SYSTEMS:  Constitutional:   No  weight loss, night sweats,  Fevers, chills, fatigue.  HEENT:    No headaches, Difficulty swallowing,Tooth/dental problems,Sore throat,  No sneezing, itching, ear ache, nasal congestion, post nasal drip,   Cardio-vascular: No chest pain,  Orthopnea, PND, swelling in lower extremities, anasarca,  dizziness, palpitations  GI:  No heartburn, indigestion, nausea, vomiting, diarrhea, change in  bowel habits, loss of appetite  Resp: No shortness of breath  at rest.  No excess mucus, no productive cough, No non-productive cough,  No coughing up of blood.No change in color of mucus.No wheezing.No chest wall deformity  Skin:  no rash or lesions.  GU:  no dysuria, change in color of urine, no urgency or frequency.  No flank pain.  Musculoskeletal: No joint pain or swelling.  No decreased range of motion.   Psych: No change in mood or affect. No depression or anxiety.    PHYSICAL EXAM: Blood pressure 168/72, pulse 68, temperature 97.5 F (36.4 C), temperature source Oral, resp. rate 20, SpO2 96.00%.  General appearance :Awake, alert, not in any distress. Speech  Clear. Not toxic Looking he had mild is dry HEENT: Atraumatic and Normocephalic, pupils equally reactive to light and accomodation Neck: supple, no JVD. No cervical lymphadenopathy.  Chest:Good air entry bilaterally, no added sounds  CVS: S1 S2 regular, no murmurs.  Abdomen: Bowel sounds present, Non tender and not distended with no gaurding, rigidity or rebound. Extremities: B/L Lower Ext shows no edema, both legs are warm to touch Neurology: Awake alert, and oriented X 3, CN II-XII intact, Non focal Skin:No Rash Wounds:N/A  LABS ON ADMISSION:   Recent Labs  10/17/12 1110  NA 134*  K 4.2  CL 100  CO2 25  GLUCOSE 101*  BUN 49*  CREATININE 4.0*  CALCIUM 14.5*   No results found for this basename: AST, ALT, ALKPHOS, BILITOT, PROT, ALBUMIN,  in the last 72 hours No results found for this basename: LIPASE, AMYLASE,  in the last 72 hours  Recent Labs  10/17/12 1110  WBC 15.3*  NEUTROABS 10.8*  HGB 12.9  HCT 39.1  MCV 91.7  PLT 443.0*   No results found for this basename: CKTOTAL, CKMB, CKMBINDEX, TROPONINI,  in the last 72 hours No results found for this basename: DDIMER,  in the last 72 hours No components found with this basename: POCBNP,    RADIOLOGIC STUDIES ON ADMISSION: Dg Chest 2 View  10/17/2012  *RADIOLOGY REPORT*  Clinical Data: History of sarcoidosis, hypertension, kidney failure, asthma, bronchitis, evaluate for pulmonary edema  CHEST - 2 VIEW  Comparison: 06/14/2012; 10/21/2010; CT abdomen pelvis - 07/21/2012  Findings:  Grossly unchanged enlarged cardiac silhouette and mediastinal contours.  There is chronic mild elevation of the right hemidiaphragm.  No focal airspace opacities.  No pleural effusion or pneumothorax.  No definite evidence of pulmonary edema.  Post cholecystectomy.  Surgical clips overlie the expected location of the gastric fundus.  Unchanged bones.  IMPRESSION: No acute cardiopulmonary disease.   Original Report Authenticated By: Tacey Ruiz,  MD     ASSESSMENT AND PLAN: Present on Admission:  . Hypercalcemia - Etiology is likely sarcoid flare-from review of her prior records-the only organ that was by her affected was her spleen-she is now status post splenectomy since2012. Perhaps there is other organ involvement-the lungs and the liver. - Irrespective of the etiology, she will  need immediate hydration-will give her 2 L bolus, then continue on with normal saline at 150 cc an hour. We'll give Lasix 80 mg IV 3 times a day-if good response this dose can be further increased tomorrow, would also start her on calcitonin for 48 hours and then stop. Given her renal failure, I am hesitant to give her bisphosphonates, this can be given if needed when her creatinine further improves. I will stop her prednisone and give her 80 mg of IV Solu-Medrol today, this can be changed to prednisone 60 mg over the next few days and I believe she will need of a slow taper. - Once her renal function and hypercalcemia start to improve, we can then begin to do further radiological studies to determine if the lung or the liver have been involved. Would check a LFT today. - for sake of completeness, I will send out a vitamin D level, PTH, ACE level, and PTH related peptide. - Spoke with nephrology on call, Dr. Briant Cedar who agreed with above-we will consult nephrology formally, if no improvement in renal function or hypercalcemia.  . Acute on chronic renal failure - Suspect hypercalcemia to the etiology here.  - Hydrating with IV fluids  - Monitor electrolytes closely  - If no function worsens, please consult nephrology then. Further workup, like renal ultrasound could be contemplated if no improvement in renal function.Patient also may need workup with SPEP/UPEP if no improvement in her renal function   . Back pain, chronic - This is a chronic issue, suspect acute renal failure, hypercalcemia and acute illness making him more deconditioned and weak.  - Will  monitor this for now, if needed further workup including imaging can be done  - PT eval  . CAD - History of nonobstructive coronary artery disease-continue with aspirin  . ASTHMA - Lungs currently clear-continue nebulized bronchodilators. No evidence of any flare   . DM - Given significant renal failure, will decrease Lantus to 40 units a day, place on resistant SSI   . HYPERTENSION - Continue with metoprolol, clonidine.  - Because of the fact that she will be on Lasix, hold off on resuming amlodipine. Closely follow blood pressure trend and restart amlodipine when and if necessary.  Marland Kitchen HYPERCHOLESTEROLEMIA - Hold off on statins for now, resume once LFTs are back and they are normal.   . OBSTRUCTIVE SLEEP APNEA - CPAP each bedtime   . OBESITY - Counseled regarding the importance of weight loss   . GERD - PPI  Further plan will depend as patient's clinical course evolves and further radiologic and laboratory data become available. Patient will be monitored closely.   DVT Prophylaxis: SQ heparin at prophylactic dose  Code Status: - Full code  Total time spent for admission equals 45 minutes.  [facility] Pager [phone]  If 7PM-7AM, please contact night-coverage [email or web address] Password [facility] 10/17/2012, 6:03 PM

## 2012-10-17 NOTE — Progress Notes (Signed)
Subjective:    Patient ID: [name], female    DOB: October 23, 1945, 67 y.o.   MRN: [id number]  HPI 67 y/o BF    ~  November 24, 2010:  [name], no evid malignancy;  Post-op her calcium level returned to normal w/o further intervention;  She had several post-op complications including a subphrenic abscess that required percutaneous drainage, and a left pleural effusion/ basilar atelectasis (s/p thoracentesis & improved)... She has improved slowly at home w/ family help & is planning return to work 11/30/10;  She notes sl cough w/ sm amt thick beige sput, & SOB/ weakness is gradually better;  Denies CP etc, notes insomnia & anxiety...  CXR today w/ improved left base, no clear baseline film;  Labs show Hg=11.6, BS=117, Creat=1.6, FLP looks good, LFTs are OK x sl incr AlkPhos, but note Ca=11.1 (was 8.6 one month ago)...  ~  March 01, 2011:  75mo ROV & she presents w/ recent cough, congestion, green drainage, and tired/ SOB/ etc; she notes incr arthritis pain, aching/ sore and some constipation;  We discussed checking f/u CXR (clear, NAD); and Labs- CBC-ok, BS=167, A1c=8.4, BUN=44, Creat=1.9, Ca=12.0, Phos=4.0, AlkPhos=167, GGT=151, ACE=78;  We discussed Rx w/ Avelox, Mucinex, Fluids; and incr Miralax/ Senakot-S/ MOM for the constipation...    Recurrent hypercalcemia most likely from active Sarcoidosis> labs w/ incr AlkPhos & GGT> we will image the liver at this time to compare to prev; and consult w/ Endocrinology for their opinion regarding the hypercalcemia AND her IDDM (esp in view of poss Prednisone rx for the calcium & sarcoid)...    Renal Insuffic followed by DrFox for Nephrology> Creat up to 1.9 from 1.6 last, needs incr fluid intake, she knows to avoid nephrotoxic agents/ meds/ etc...    She has known MGUS followed by DrGranfortuna but prev bone marrow was neg etc; due for MGUS labs this fall,  current CBC is stable: Hg=12.8, Fe=45...  ~  May 27, 2011:  75mo ROV & she has established w/ DrAltheimer for Endocrine on Levemir & Apidra, along w/ Glimepiride & Januvia; we called in Prednisone 20mg /d in early Oct to start rx for her high calcium levels & labs from DrA's office reviewed >> Calcium has improved to 10.3 on 3wks of oral Pred...    Sarcoidosis w/ HYPERCALCEMIA> Ca++ was 12.4 -13.4 in Aug & Sept; since starting Pred20mg /d in early Oct her Ca++ has improved to 10.3 now; we decided to slowly taper the Pred in light of her IDDM & improved Ca++ level (try 20mg , alt w/ 10mg  Qod)...    HBP> on Norvasc & Diovan; BP= 132/80 & she denies CP, palpit, dizzy, ch in SOB/ DOE, edema, etc...    CHOL> on Lip20; FLP 4/12 looked good & we reviewed low fat diet restrictions...    DM on Insulin> as above, followed by DrA on Levemir30 & ApidraSS coverage at meals; BS at home varies 150-400+, FBS today=80, latest A1c was 7.6 in Aug...    Obesity> wt= 180# (63"Tall & BMI=32) which is up 8# and we reviewed diet, exercise, & wt reduction strategies...    GI> GERD on NexiumBid; and we reviewed antireflux regimen (elev HOB, npo after dinner, etc)...    Renal Insuffic> Creat had incr to 2.3 when Ca++ rose to 13.4, but has returned to 1.8 now; advised incr free water intake...    Anxiety> on Alprazolam prn; she's been under extra  stress w/ loss of job, this illness, family issues...  ~  June 28, 2011:  65mo ROV> on Pred 20-10 Qod for the last month and BMet today shows Ca++=9.8;  She is anxious to wean the Pred as quickly as poss due to incr appetite, weight gain, difficulty controlling her DM, etc;  She has been seeing DrAltheimer every 3-4wks w/ adjustments in her insulin> currently on Levemir 46u qd; Apidra 10+SS Tid at meals, Amaryl & Januvia;  BS's vary tremendously & she is struggling w/ diet (weight up 8# in the last month to 188# today);  We decided to cut the Pred to 10mg /d now and then further to 10-5  qod after 3wks w/ ROV in 6 weeks...  ~  August 16, 2011:  6wk ROV & post hosp check> she was Adm by [facility] 1[facility]8 - 08/12/11 w/ dyspnea (DOE, orthopnea, edema) thought to be due to diastolic heart failure (2DEcho w/ Gr2DD, BNP=2200), she was diuresed & improved, disch on LASIX40mg - 1/2 tab each am & her prev Diovan was stopped (due to renal insuffic w/ Creat=2.4)...  Her serum Calcium levels were all norm 9.6-9.8;  A1c=9.3;  Hg=10.4>> See below...    Feeling better since disch;  Weight 196# is still 8# heavier than her last visit w/ me 11/12;  We decided to decr her Pred from 10-5 Qod to 5mg /d & keep Lasix at 20mg /d... LABS 1/13:  BUN=33, Creat=1.7, BNP=100;  Ca=9.7;  Hg=12.9;  BS=69 & she will f/u w/ DrAltheimer...  ~  October 06, 2011:  24mo ROV on Pred 5mg /d & generally stable but w/ mult minor somatic complaints- sl SOB, some cough, thick white mucous, right arm discomfort, & reflux symptoms;  She had a ZPak called in about 2wks ago;  She is on a good antireflux regimen, plus Flonase & Saline;  We will add Tussionex for prn use;  Unfortunately weight is up 9# further tp 205# today;  BP remains controlled on 5 MEDS (see below);  She continues to f/u w/ DrAltheimer for DM> on Levimir 46u & Apidra 03-15-13;  We decided to cut the Pred to 5mg  QOD & keep the Lasix 20mg /d... LABS 3/13:  BS=135,  BUN=38, Creat=1.6;  Ca=10.1;  ACE=48;  BNP=128...  ~  January 06, 2012:  167mo ROV & [name] has been on Pred5mg Qod for the last 167mo; she has noted some joint pains & has rash on her arms- seeing derm w/ hx Psoriasis but she feels their topical rx isn't working; she also notes sl cough, some greenish sput production, chest feels tight, & some right sided chest discomfort- she is requesting a ZPak which seems to work for her...     She saw DrAlt 4/13> Ave BS from her machine 249 & A1c=9.1, insulin adjusted; FLP looked good on Lip20; extensive note reviewed...    [name], meds, xrays and labs> see below>> LABS 6/13:   Chems- BS=169 BUN=41 Creat=2.3 BNP=104 Ca=11.0 ACE=86;  CBC- ok w/ Hg=12.4 REC> Stop Lasix20 for now, increase Pred to 10mg /d, ROV 6wks w/ BMet...  ~  March 30, 2012:  167mo ROV & post-ER visit> she's been to the ER twice in the last 24mo w/ SOB, cough, congestion, etc; on Pred, Advair, Mucinex, Tussionex, & given antibiotics, Neb Rx, etc & improved;  Also rec to restart     She saw DrWall for Cards f/u 7/13> known CAD, DOE, good BP control & 2DEcho showed good LVF & no evid for pulmHTN; continue same meds, &  Lasix...    She had f/u DrAltheimer 6/13> DM, renal insuffic, hyperlipidemia, hypercalcemia, etc; on Levemir80, Apidra20-34-30, WRUEAVW09 & Amaryl4; BS=118 A1c=8.8.Marland KitchenMarland Kitchen [name], meds, xrays and labs> see below for updates >> CXR 8/13 showed cardiomeg, ?mild interstitial edema, mild DJD TSpine... EKG 8/13 showed NSR, rate93, min NSSTTWA, NAD... LABS 8/13:  CBC- Hg=11.9 WBC=18K;  Chems- BUN=30 Creat=1.6 Ca=10.0 BNP=2960 ==> started back on Lasix40/d...  ~  April 28, 2012:  14mo ROV & last visit we decreased the Pred to 5mg /d (on this for hypercalcemia from Sarcoid) & decreased the Lasix to 20mg /d (due to RI w/ Cr=2.2);  Follow up labs today showed Ca=9.1 and we decided to decr the Pred further to 5mg  Qod;  In addition her BUN=40, Creat=2.1, BNP=196 (ok to continue Lasix20); she understands that if her Creat starts to rise further that we will have to send her to Nephrology...    She saw DrAltheimer for f/u 9/13> note reviewed & he adjusted her insulin regimen- Levemir 80uQam & Apidra 20-34-30 + SS adjustments...    She fell 8/30 & hit her head> went to ER & DrLockwood's note is reviewed- c/o headache & left knee pain; Exam showed forehead hematoma & CTBrain showed left frontal scalp hematoma, no fx, mild cortical atrophy, mild carotid atherosclerosis, NAD;  Maxillofacial CT was neg w/o facial bone fx;  Left knee prosthesis & no acute changes...    She reports that she just had an ESI  injection from DrRamos for LBP & she is improved...    [name], meds, xrays and labs> see below for updates >> LABS 9/13:  Chems- BS=234, Ca=9.1, BUN=40, Creat=2.1, BNP=196   ~  June 21, 2012:  63mo ROV & Riki went to the ER 06/14/12 w/ c/o SOB> she had developed a sore throat, wheezing, cough, & SOB; she had taken ZPak, Delsym, Proair which helped somewhat;  CXR showed cardiomeg, pulm vasc congestion, DJD in TSpine, NAD;  ER treated w/ Pred boost to 40mg /d, & NEB Rx which really helped;  She notes stuff head & some drainage, denies GI/ reflux symptoms;  She is obese, cushingoid, and we were in the process of weaning off the Pred when it was boosted up by ER- we gave her a NEB Rx & this reallly helped;  Placed on O2 by nasal cannula for hypoxemia- 84% on RA;  Decision made to wean Pred down to 5mg Qod as before, continue Advair250Bid, add NEBS Qid w/ Albut vs Proair, plus her Mucinex/ Fluids/ etc...    She continues her regular f/u appts w/ DrAltheimer, Endocrine> on Levemir, Apidra, Amaryl, Januvia; last A1c=8.1 is sep2013...    [name], meds, xrays and labs> see below for updates >>   10/17/2012 Acute OV  Complains of 3 weeks of severe weakness, no appetite ,  Developed cough and congestion 2  week ago, called in abx but did not take then as directed.  Also tells me she has not taking her meds over last 4 weeks. , has not felt like taking them.  Lives alone, family helps some. Brother is with her today .  Not wearing her CPAP .  Has been so weak she can do anything.  Lost 25lbs since over last 4 mont  Not eating. No appetite Today labs show Ca+~14, scr 4.0 .  Hx of renal insufficiency w/ last scr 1.4.  She will need admission for further evaluation .  No NSAID use  Problem List:    OBSTRUCTIVE SLEEP APNEA - Sleep Study 11/07 showed RDI=21 w/ desat to 62% during REM.Marland Kitchen. eval by DrSood w/ Rx for CPAP 12... compliance poor- only using it 1-2 times/wk...  encouraged to use CPAP more regularly & to f/u w/ DrSood regarding the mask interface problems... ~  8/11:  she reports new mask but still not using CPAP regularly, "I rest fairly well". ~  7/12:  She reports not using her CPAP, and not resting well... ~  1/13:  CPAP used briefly during [facility] for diastolic CHF, but still not compliant w/ home use...  BRONCHITIS, ACUTE - on ADVAIR 250Bid (using Prn only) & PROAIR as needed. 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 ~  CXR 1/09 was pre-op for TKR- sl cardomeg & ?mild vasc congestion. ~  CXR 2/11 showed norm heart size & vascularity, clear, s/p cholecystectomy, DJD in TSpine. ~  CXR & CT Chest 11/11 showed sl peribronch thickening, no lung lesions, no signif adenopathy. ~  PET scan 1/12 showed hypermetabolic activ in spleen & lymph nodes in the supraclav, hilar, mediastinal, periaotic & iliac region as well. ~  CXRs 3/12 in the perioperative period after splenectomy showed left effusion==> tapped & improved aeration. ~  CXR 4/12 back to baseline w/ clear bases, essent wnl... ~  CXR 7/12 showed norm heart size, clear lungs, no definite adenopathy, NAD... ~  LABS 7/12 w/ ACE=78 (8-52), Ca=12.0, AlkPhos=176 (39-117), GGT=151 (7-51) ~  Labs from DrAltheimer> 8/12: Ca=12.4, Creat=1.8; then 9/12: Ca=13.4, Creat=2.3;  ~  10/12:  PREDNISONE 20mg /d started 10/12 & 3wks later Ca=10.3, Creat=1.8; therefore weaned to 20-10 Qod. ~  11/12: Labs on Pred 20-10 Qod showed Ca++= 9.8; we will continue to wean Pred. ~  1/13:  CXR showed cardiomeg, mild interstitial edema, DJD spine; Labs showed Ca=9.7 & we decided to wean Pred to 5mg /d... ~  3/13:  Labs showed Ca=10.1 on Pred 5mg /d; decided to wean to 5mg Qod... ~  6/13:  Labs showed Ca=11.0, ACE=86; on Pred5Qod & rec to incr back to 10mg /d... ~  8/13:  CXR showed cardiomeg, ?mild interstitial edema, mild DJD TSpine... ~  Labs 8/13 showed Ca=10.0 on Pred10/d; rec to  decr further to 5mg /d... ~  Labs 9/13 showed Ca= 9.1 on Pred5mg /d & she will try to decr to 5Qod again... ~  11/13: she recently went to ER w/ incr SOB & was placed on Pred40/d; we are adding NEBS w/ ALBUT2.5mg Qid & wean Pred back to 5mg Qod as before.  HYPERTENSION - taking METOPROLOL 25mg - 1/2 daily, NORVASC 10mg /d, APRESOLINE 10mg Tid, CLONIDINE 0.1mg Bid, LASIX 20mg - see below; off prev [id number] due to azotemia w/ diuresis. ~  4/12:  BP here 132/62 and she denies HA, visual changes, CP, palipit, dizziness, syncope, change in dyspnea, etc... ~  10/12:  BP= 132/80 & she denies CP, palpit, dizzy, ch in SOB/ DOE, edema, etc... ~  11/12:  BP= 132/82 & she is relatively asymptomatic other than DOE from her weight and some musc cramps... ~  1/13: Post Hosp BP= 146/70 on ? - Metop12.5, Amlod10, Clonid0.1Bid, Lasix20; she denies CP, palpit, SOB, edema> improved from recent hosp... ~  3/13: She called in the interim for "refill" Apresoline not prev on any med list; BP= 150/80 on all 5 meds; must elim sodium & get wt down... ~  6/13:  BP= 142/80 on but BUN-41 Creat=2.3 BNP=104; rec to stop Lasix for now, f/u BMet 6wks. ~  8/13:  BP= 140/70  on same meds & BUN=30, Creat=1.6, but BNP incr to 2960 & LASIX 40mg /d restarted... ~  9/13:  Follow up labs showed BUN=45, Creat=2.2 on Lasix40 and we rec decr to Lasix20... ~  9/13:  BP= 122/74 on w/ BUN=40, Creat=2.1, & BNP=196... Continue same. ~  11/13:  BP= 142/68 on same ...  CAD - on ASA 81mg /d...  ~  Hx non-obstructive CAD w/ cath 6/07 by DrStuckey showing luminal irregularities & tiny first marginal branch of the CIRC w/ ostial lesion... good LVF. ~  2DEcho 9/07 showed mildly calcif AoV and normal LVF & wall motion. ~  2DEcho 11/11 showed norm LV wall thickness, norm LVF w/ EF= 60-65%, norm atria, norm valves, trivial peric fluid behind heart. ~  2DEcho 1/13 showed mild LVH, norm LVF w/ EF=55-60% & no regional wall motion abn, Gr2DD, norm  RV...  CEREBROVASCULAR DISEASE - she remains on ASA 81mg /d without TIA's or other neuro manifestations...  ~  MRA Br 9/07 showed mod intracranial atherosclerotic changes...   HYPERLIPIDEMIA - on LIPITOR 20mg /d... FLP monitored at each OV by DrAltheimer... ~  FLP 7/07 showed TChol 114, TG 85, HDL 28, LDL 69 ~  FLP 4/09 showed TChol 154, TG 192, HDL 34, LDL 82... discussed poss of adding fibrate- hold... ~  FLP 2/10 on Lip20 showed TChol 159, TG 207, HDL 29, LDL 83... rec> add Fenoglide (she didn't). ~  FLP 2/11 on Lip20 showed TChol 185, TG 242, HDL 43, LDL 99... add FENOFIBRATE 160mg /d... ~  FLP 8/11 on Lip20+Feno160 showed TChol 167, TG 216, HDL 37, LDL 96... she stopped Feno160. ~  FLP 4/12 on Lip20 showed TChol 146, TG 115, HDL 35, LDL 88 ~  FLP 2/13 on Lip20 showed TChol 159, TG 176, HDL 41, LDL 86  DM - on LEVEMIR & APIDRA SS coverage now per DrAltheimer; plus GLIMEPIRIDE 4mg /d and JANUVIA 50mg /d. ~  labs 4/08 showed BS=138 & HgA1c=7.5.Marland Kitchen. home BS = 100 to >300... misses doses and not on diet or exercising... ~  1/09 became hypoglycemic in hosp on 4 meds post knee surg- meds adjusted... ~  3/09 restarted GLIMEPIRIDE 4mg - 1/2 tab each AM, & continue Lantus & Metformin... ~  labs 4/09 showed BS= 148, HgA1c= 7.3.Marland Kitchen. rec> back on 4 med regimen due to poor control at home. ~  6/09 discussed titrating the Lantus up until FBS 100-120 range... ~  labs 2/10 showed BS= 164, HgA1c= 8.6.Marland KitchenMarland Kitchen very disappointing- needs home BS monitoring, incr Lantus. ~  labs 2/11 showed BS= 298, A1c= 8.4...  discussed change Lantus to LEVEMIR 40 u daily... ~  labs 8/11 showed BS= 202, A1c= 10.9.Marland Kitchen. rec> incr Levemir to 50, consider Humalog. ~  Nov-Dec 2011:  BS up w/ adjust of meds & addition of Pred after hosp 11/11 & hypercalcemia... ~  She states she returned to Levemir 40u daily after the splenectomy hosp due to appetite etc;  4/12 BS=117 ~  Labs 7/12 showed BS= 167, A1c= 8.4> on Levemir 50u/d & Amaryl 4mg  Qam (note  creat 1.9). ~  8-10/12: on Levemir30 + ApidraSS per DrA & BS are 150-400+ due to the Pred20; A1c 8/12 was 7.6 ~  11/12:  DrA has increased her Levemir to 46u/d & the Apidra to 10+SS cover Tid... BS here= 168 ~  1/13: Covered w/ SSI in Hosp but A1c=9.3 (Creat up to 2.4 but improved to 1.7 w/ adjust meds)... ~  DrAltheimer's notes indicates continued adjustments in her insulin regimen w/ Levemir & Apidra.Marland KitchenMarland Kitchen  OBESITY (ICD-278.00) - weight down to 171 after hosp 11/11- doing better on diet, not yet exercising. ~  weight 220-230# in the early 1990's... ~  weight 210-220# in the early 2000's... ~  weight 2/10 = 192# ~  weight 2/11 = 186# ~  weight 8/11 = 174# ~  weight 12/11 = 171# ~  Weight 4/12 = 168# ~  Weight 7/12 = 171# ~  Weight 10/12 = 180# ~  Weight 11/12 = 188# ~  Weight 1/13 = 196# ~  Weight 3/13 = 205#... She MUST get back on diet, incr exercise, get wt down. ~  Weight 6/13 = 205# ~  Weight 8/13 = 210# ~  Weight 9/13 = 220#... What happened? ~  Weight 11/13 = 225# and BMI= 40  HYPERCALCEMIA> this was most likely due to sarcoidosis w/ extensive splenic involvement;  Calcium was as hight as 13.7 and returned to normal after the splenectomy. ~  Labs 4/12 showed calcium = 11.1 & this is very disappointing... ~  Labs 7/12 showed Ca= 12.0, Phos= 4.0, ACE= 78 ~  Labs 8-10/12 showed Ca up to 13.4, then down to 10.3 on PRED20mg /d... ~  Labs 11/12 showed Ca= 9.8 on Pred 20-10 qod; we will continue to wean. ~  Labs 1/13 in [place] showed Ca= 9.7 range on Pred 10-5 Qod;  We decided to wean Pred to 5mg /d... ~  Labs 3/13 showed Ca=10.1 on Pred5mg /d; rec to decr to 5mg Qod... ~  Labs 6/13 showed Ca=11.0 on Pred5Qod; rec incr to 10mg /d... ~  Labs 8/13 showed Ca=10.0 on Pred10/d; rec to decr to 5mg /d... ~  Labs 9/13 showed Ca=9.1 on Pred5mg /d; rec to decr to 5mg Qod...  GERD - on NEXIUM 40mg  Bid... w/ increased reflux symptoms w/ noct cough etc...  ~  2/10: discussed optimal Rx w/ Nex before  dinner, Zantac300 + Reglan10 at bed, elev HOB, etc. ~  2/11: she stopped the Reglan, still using Nexium/ Zantac but w/ persist symptoms>  refer to GI for eval. ~  6/11: GI f/u DrPerry w/ EGD that was normal... continue Rx. ~  12/11:  increased GI symptoms post hosp> she will f/u w/ DrPerry for his input> incr Nexium Bid.  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 ~  6/11:  f/u colonoscopy by DrPerry showed divertics, 4 polyps, AVM.Marland KitchenMarland Kitchen path= tubular adenoma, f/u 75yrs.  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 ~  Note: Serum Calcium ret to normal immediately after spleen removed...  RENAL INSUFFICIENCY (ICD-588.9) >> Creat ~ 2.0 & eval 10/11 by DrFox- prob due to DM & HBP... ~  Labs 4/12 showed BUN= 27, Creat= 1.6 ~  Labs 7/12 showed BUN= 44, Creat= 1.9 (not on diuretics/ NSAIDs/ etc)... ~  Labs showed Creat up to 2.3 when Ca=13.4; now improved to 1.8 w/ Ca coming down... ~  Labs 11/12 showed BUN=38, Creat=1.7; rec to maintain hydration... ~  Labs 1/13 in [place] w/ diuresis showed Creat incr to 2.4 but ret to 1.7 w/ adjustment in meds... ~  Labs 3/13 showed BUN=38, Creat= 1.6 ~  Labs 6/13 showed BUN=41, Creat= 2.3; rec> stop Lasix20 for now, liberalize fluids. ~  Labs 9/13 showed BUN=40, Creat=2.1 on Lasix20, rec to continue the same.  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 on LYRICA 100mg  Qhs, off prev Tramadol Rx... ~  2010 eval by DrHiatt @ [facility] on Tramadol 50mg , Lyrica 75mg Bid...  GOUT, UNSPECIFIED (ICD-274.9) - Uric in the 7-8 range...on ALLOPURINOL 300mg /d for prevention...  ANXIETY (ICD-300.00) - she is under mod stress- work, mother died, hospice counselling, etc... ~   8/11:  rec starting Alprazolam 0.5mg  Tid for palpit, SOB, anxiety...  ANEMIA-NOS & MGUS >> see eval 10-11/11 by DrGranfortuna w/ bone marrow 11/11 in hosp= neg. ~  Labs 4/12 showed Hg= 11.6, MCV= 84... On FeSO4 daily. ~  Labs 7/12 showed Hg= 12.8, MCV= 90, Fe= 45 (14%sat)... ~  Labs 1/13 in [place] showed Hg=10.4 but improved to 12.9 on post hosp check in office... ~  Labs 6/13 showed Hg= 12.4  PSORIASIS - eval and Rx per DrTafeen prev on Humira injections- stopped 9/11...   Past Surgical History  Procedure Laterality Date  . Cholecystectomy    . Vesicovaginal fistula closure w/  total abdominal hysterectomy    . Bilateral total knee replacements  Rt=5/04 & Lft=1/09    by DrAlusio  . Tonsillectomy    . Appendectomy    . Abdominal hysterectomy    . Breast reduction surgery  1982  . Open splenectomy  [id number]    by DrRosenbower    Outpatient Encounter Prescriptions as of 10/17/2012  Medication Sig Dispense Refill  . albuterol (PROVENTIL HFA;VENTOLIN HFA) 108 (90 BASE) MCG/ACT inhaler Inhale 2 puffs into the lungs every 6 (six) hours as needed. For shortness of breath.      Marland Kitchen albuterol (PROVENTIL) (2.5 MG/3ML) 0.083% nebulizer solution Take 2.5 mg by nebulization 4 (four) times daily. Shortness of breath      . allopurinol (ZYLOPRIM) 300 MG tablet Take 300 mg by mouth daily.      Marland Kitchen amLODipine (NORVASC) 10 MG tablet Take 10 mg by mouth daily.      Marland Kitchen amoxicillin-clavulanate (AUGMENTIN) 875-125 MG per tablet Take 1 tablet by mouth 2 (two) times daily.  14 tablet  0  . atorvastatin (LIPITOR) 20 MG tablet Take 20 mg by mouth daily.      . chlorpheniramine-HYDROcodone (TUSSIONEX) 10-8 MG/5ML LQCR Take 5 mLs by mouth every 12 (twelve) hours. For cough      . cloNIDine (CATAPRES) 0.1 MG tablet Take 0.1 mg by mouth 2 (two) times daily.      Marland Kitchen esomeprazole (NEXIUM) 40 MG capsule Take 40 mg by mouth 2 (two) times daily.      . ferrous sulfate 325 (65 FE) MG tablet Take 325 mg by mouth daily with  breakfast.      . fluticasone (FLONASE) 50 MCG/ACT nasal spray Place 2 sprays into the nose at bedtime.  48 g  3  . Fluticasone-Salmeterol (ADVAIR) 250-50 MCG/DOSE AEPB Inhale 1 puff into the lungs 2 (two) times daily.      . furosemide (LASIX) 20 MG tablet Take 20 mg by mouth daily.      Marland Kitchen glimepiride (AMARYL) 4 MG tablet Take 4 mg by mouth daily before breakfast.      . hydrALAZINE (APRESOLINE) 10 MG tablet Take 10 mg by mouth 3 (three) times daily.      . insulin detemir (LEVEMIR) 100 UNIT/ML injection Inject 80 Units into the skin every morning.       . insulin glulisine (APIDRA SOLOSTAR) 100 UNIT/ML injection Inject 10-20 Units into the skin 3 (three) times daily before meals. to be Used with  sliding scale as directed      .  ketorolac (ACULAR LS) 0.4 % SOLN Place 1 drop into both eyes 3 (three) times daily.       Marland Kitchen LORazepam (ATIVAN) 1 MG tablet Take 1 mg by mouth daily as needed. For anxiety.      . metoprolol tartrate (LOPRESSOR) 25 MG tablet take 1/2 tablet by mouth once daily  30 tablet  3  . predniSONE (DELTASONE) 5 MG tablet Take 5 mg by mouth every other day.       . pregabalin (LYRICA) 100 MG capsule Take 100 mg by mouth every evening.      . promethazine (PHENERGAN) 25 MG tablet Take 1 tablet (25 mg total) by mouth every 6 (six) hours as needed for nausea.  20 tablet  0  . sennosides-docusate sodium (SENOKOT-S) 8.6-50 MG tablet Take 1 tablet by mouth at bedtime.      . sitaGLIPtin (JANUVIA) 50 MG tablet Take 50 mg by mouth daily.      Marland Kitchen aspirin EC 81 MG tablet Take 81 mg by mouth daily.      Marland Kitchen HYDROcodone-acetaminophen (NORCO/VICODIN) 5-325 MG per tablet Take 1 tablet by mouth every 6 (six) hours as needed. for pain      . [DISCONTINUED] cephALEXin (KEFLEX) 500 MG capsule Take 1 capsule (500 mg total) by mouth 4 (four) times daily.  28 capsule  0  . [DISCONTINUED] HYDROcodone-acetaminophen (NORCO/VICODIN) 5-325 MG per tablet Take 2 tablets by mouth every 4 (four) hours as needed for  pain.  15 tablet  0   No facility-administered encounter medications on file as of 10/17/2012.    Allergies  Allergen Reactions  . Adhesive (Tape) Other (See Comments)    Blisters   . Avelox (Moxifloxacin Hcl In Nacl)     GI upset  . Codeine Other (See Comments)    "crazy"   . Guaifenesin Nausea And Vomiting  . Latex Swelling  . Oxycodone Nausea And Vomiting    Current Medications, Allergies, Past Medical History, Past Surgical History, Family History, and Social History were reviewed in [employer] record.   Review of Systems Constitutional:   No  weight loss, night sweats,  Fevers, chills,  +fatigue, or  lassitude.  HEENT:   No headaches,  Difficulty swallowing,  Tooth/dental problems, or  Sore throat,                No sneezing, itching, ear ache, nasal congestion, post nasal drip,   CV:  No chest pain,  Orthopnea, PND, swelling in lower extremities, anasarca,  , palpitations, syncope.   GI  No heartburn, indigestion, abdominal pain, nausea, vomiting, diarrhea, change in bowel habits, loss of appetite, bloody stools.   Resp:   No coughing up of blood.  No change in color of mucus.  No wheezing.  No chest wall deformity  Skin: ++rash or lesions.  GU: no dysuria, change in color of urine, no urgency or frequency.  No flank pain, no hematuria   MS:  No joint swelling.  No decreased range of motion.  No back pain.  Psych:  No change in mood or affect. No depression or anxiety.  No memory loss.                Objective:   Physical Exam      WD, Overweight, 66 y/o BF in NAD...  GENERAL:  Alert & oriented; pleasant & cooperative... HEENT:  [place]/AT, EOM-wnl, PERRLA, EACs-clear, TMs-wnl, NOSE-clear, THROAT- clear & wnl... NECK:  Supple w/ fair ROM; no  JVD; normal carotid impulses w/o bruits; palp thyroid, w/o nodules felt; no lymphadenopathy. CHEST:  Clear to P & A; without wheezes/ rales/ or rhonchi. HEART:  Regular Rhythm; gr 1/6 SEM, without  rubs/ or gallops. ABDOMEN:  Scar from splenectomy surg; obese, soft, & nontender w/ panniculus; normal bowel sounds; no organomegaly or masses detected EXT: without deformities, mod arthritic changes, s/p bilat TKRs; no varicose veins/ +venous insuffic/ tr edema. NEURO:  CN's intact; no focal neuro deficits... DERM:  Psoriasis diffuse rash per DrTafeen...    Recent Labs Lab 10/17/12 1110  CREATININE 4.0*     Recent Labs Lab 10/17/12 1110  HGB 12.9  HCT 39.1  WBC 15.3*  PLT 443.0*    Assessment & Plan:

## 2012-10-17 NOTE — Assessment & Plan Note (Signed)
Acute renal Failure -new onset ? Etiology dehydration , infectious process w/ elevated WBC . ? Taking steroids ? Adrenal insufficiency  W/ anorexia and severe Hypercalcemia  Will need admission  Case discussed with Dr. Kriste Basque  .  willl send to [facility] for [facility] to [facility].  Will need to make sure she is started back on steroids  ACE level will need to be checked .

## 2012-10-18 ENCOUNTER — Encounter: Payer: Self-pay | Admitting: *Deleted

## 2012-10-18 ENCOUNTER — Telehealth: Payer: Self-pay | Admitting: Adult Health

## 2012-10-18 DIAGNOSIS — I10 Essential (primary) hypertension: Secondary | ICD-10-CM

## 2012-10-18 DIAGNOSIS — E119 Type 2 diabetes mellitus without complications: Secondary | ICD-10-CM

## 2012-10-18 DIAGNOSIS — N189 Chronic kidney disease, unspecified: Secondary | ICD-10-CM

## 2012-10-18 LAB — CBC
HCT: 39 % (ref 36.0–46.0)
Hemoglobin: 13.4 g/dL (ref 12.0–15.0)
MCHC: 34.4 g/dL (ref 30.0–36.0)
RBC: 4.38 MIL/uL (ref 3.87–5.11)

## 2012-10-18 LAB — GLUCOSE, CAPILLARY
Glucose-Capillary: 321 mg/dL — ABNORMAL HIGH (ref 70–99)
Glucose-Capillary: 311 mg/dL — ABNORMAL HIGH (ref 70–99)
Glucose-Capillary: 312 mg/dL — ABNORMAL HIGH (ref 70–99)

## 2012-10-18 LAB — URINE CULTURE: Colony Count: 2000

## 2012-10-18 LAB — COMPREHENSIVE METABOLIC PANEL
ALT: 17 U/L (ref 0–35)
AST: 17 U/L (ref 0–37)
Calcium: 12.5 mg/dL — ABNORMAL HIGH (ref 8.4–10.5)
Creatinine, Ser: 3.04 mg/dL — ABNORMAL HIGH (ref 0.50–1.10)
GFR calc Af Amer: 18 mL/min — ABNORMAL LOW (ref >90)
Glucose, Bld: 415 mg/dL — ABNORMAL HIGH (ref 70–99)
Sodium: 134 mEq/L — ABNORMAL LOW (ref 135–145)
Total Protein: 7.3 g/dL (ref 6.0–8.3)

## 2012-10-18 LAB — PTH, INTACT AND CALCIUM
Calcium, Total (PTH): 12.8 mg/dL — ABNORMAL HIGH (ref 8.4–10.5)
PTH: 23.8 pg/mL (ref 14.0–72.0)

## 2012-10-18 MED ORDER — INSULIN GLARGINE 100 UNIT/ML ~~LOC~~ SOLN: 80.0000 [IU] | Freq: Every day | SUBCUTANEOUS | Status: DC

## 2012-10-18 MED ORDER — INSULIN GLARGINE 100 UNIT/ML ~~LOC~~ SOLN
40.0000 [IU] | Freq: Once | SUBCUTANEOUS | Status: DC
  Filled 2012-10-18: qty 0.4

## 2012-10-18 MED ORDER — DIPHENHYDRAMINE HCL 25 MG PO CAPS
25.0000 mg | ORAL_CAPSULE | Freq: Three times a day (TID) | ORAL | Status: DC | PRN
  Administered 2012-10-18: 25 mg via ORAL
  Filled 2012-10-18: qty 1

## 2012-10-18 MED ORDER — INSULIN ASPART 100 UNIT/ML ~~LOC~~ SOLN
0.0000 [IU] | Freq: Every day | SUBCUTANEOUS | Status: DC
Start: 2012-10-18 — End: 2012-10-22
  Administered 2012-10-19: 3 [IU] via SUBCUTANEOUS
  Administered 2012-10-20: 5 [IU] via SUBCUTANEOUS
  Administered 2012-10-21: 23:00:00 via SUBCUTANEOUS

## 2012-10-18 MED ORDER — INSULIN ASPART 100 UNIT/ML ~~LOC~~ SOLN
0.0000 [IU] | Freq: Three times a day (TID) | SUBCUTANEOUS | Status: DC
  Administered 2012-10-18: 7 [IU] via SUBCUTANEOUS
  Administered 2012-10-19: 3 [IU] via SUBCUTANEOUS
  Administered 2012-10-19: 1 [IU] via SUBCUTANEOUS
  Administered 2012-10-19: 3 [IU] via SUBCUTANEOUS
  Administered 2012-10-20: 2 [IU] via SUBCUTANEOUS
  Administered 2012-10-20 – 2012-10-21 (×2): 3 [IU] via SUBCUTANEOUS
  Administered 2012-10-21: 5 [IU] via SUBCUTANEOUS
  Administered 2012-10-22: 1 [IU] via SUBCUTANEOUS

## 2012-10-18 MED ORDER — METOPROLOL TARTRATE 12.5 MG HALF TABLET
12.5000 mg | ORAL_TABLET | Freq: Two times a day (BID) | ORAL | Status: DC
  Administered 2012-10-18 – 2012-10-20 (×4): 12.5 mg via ORAL
  Filled 2012-10-18 (×6): qty 1

## 2012-10-18 MED ORDER — INSULIN ASPART 100 UNIT/ML ~~LOC~~ SOLN
25.0000 [IU] | Freq: Three times a day (TID) | SUBCUTANEOUS | Status: DC
  Administered 2012-10-18 – 2012-10-19 (×3): 25 [IU] via SUBCUTANEOUS

## 2012-10-18 MED ORDER — INSULIN DETEMIR 100 UNIT/ML ~~LOC~~ SOLN
40.0000 [IU] | Freq: Once | SUBCUTANEOUS | Status: AC
  Administered 2012-10-18: 40 [IU] via SUBCUTANEOUS
  Filled 2012-10-18: qty 0.4

## 2012-10-18 MED ORDER — INSULIN DETEMIR 100 UNIT/ML ~~LOC~~ SOLN
80.0000 [IU] | Freq: Every day | SUBCUTANEOUS | Status: DC
  Filled 2012-10-18: qty 0.8

## 2012-10-18 NOTE — Progress Notes (Signed)
Patient refused CPAP tonight. Stated that the nasal mask was not comfortable and kept falling down into her mouth.  RT offered to readjust for her or try the full face mask due to this being what the patient has at home.  Patient did not wish to try either of these options.  RT also mentioned to patient about having a family member to bring her mask and tubing from home.  Patient stated that she would have a family member bring them tomorrow.  RT made patient aware that if at any point throughout he night she decided that she wanted to be placed on CPAP to let RN know and RT would come back and assist her with getting the machine started and the mask on.  RT will continue to monitor patient.

## 2012-10-18 NOTE — Telephone Encounter (Signed)
I spoke with [place]. Advised her pt has been on Metoprolol 25 mg 1/2 tab daily since 2012. Nothing new. She needed nothing further she just wanted to make sure this is correct

## 2012-10-18 NOTE — Progress Notes (Signed)
Utilization review completed.  

## 2012-10-18 NOTE — Progress Notes (Addendum)
[facility] PROGRESS NOTE  [name] Kil WUJ:[id number] DOB: [birth year] DOA: 10/17/2012 PCP: Michele Mcalpine, MD  Brief narrative Patient is a 67 year old African American female with a past medical history of diabetes, hypertension, bronchial asthma, obstructive sleep apnea intermittently compliant with CPAP therapy, chronic back pain for which she gets intermittent as steroid shots by Dr. 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 She has had frequent falls over the past 2 months because of generalized weakness and difficulty ambulating. She claims that she is slightly confused and has had increasing problems with short-term memory. She claims she has not had a bowel movement for the past 1 week. She has had occasional abdominal pain that she cannot describe well. She was seen at her endocrinologist office a few days ago and was told she was high, she was then seen by her primary care practitioner, calcium levels were over 14 creatinine was 4 and she was sent over to the emergency room for further evaluation. [facility] was subsequently asked to admit this patient for further evaluation and treatment   Assessment/Plan: 1. Hypercalcemia: Etiology possibly from sarcoid flare. Per H&P, only organ that had been affected was her spleen and patient is now status post splenectomy.? Other organ involvement now. Continue IV normal saline aggressive hydration, Lasix, calcitonin and IV Solu-Medrol. Slightly improved. Trend daily calcium and albumin. ACE level increased at 80. PTH 23.8. TSH 2.77. Vitamin D 1,25, PTH related peptide-pending. Discussed with primary endocrinologist who thinks that this may be secondary to sarcoidosis. Patient has  monoclonal gammopathy for which she sees Dr. Craig Staggers discuss with him. 2. Acute on stage III chronic kidney disease:  suspect related to hypercalcemia. Clinically appears euvolemic. Renal functions are slightly better. Baseline creatinine 1.4-1.7. Follow daily BMP. We'll consider nephrology consultation if not adequately improving. 3. Uncontrolled DM 2: Precipitated by steroids. Patient claims compliance to high dose Levemir and Apidra mealtime and sliding scale insulin. Will increased these insulins close to her home dose and monitor closely. Hemoglobin A1c 08/11/11 was 9.3. 4. Hypertension, mildly uncontrolled: Continue clonidine. Increase metoprolol to twice a day. Amlodipine held.  5. Chronic back pain: May be from hypercalcemia. Currently he indicates that she has no back pain while laying down but needs to be evaluated when she is up and about. 6. CAD: Asymptomatic of chest pain. Continue aspirin. 7. Asthma: Stable. 8. Hyper cholesterolemia: LFTs unremarkable. Resumed statins. 9. OSA: Continue nightly CPAP. 10. GERD: PPI.  Code Status: Full Family Communication: Discussed with patient. Disposition Plan: Home when medically stable.   Consultants:  None  Procedures:  None  Antibiotics:  None   HPI/Subjective: Feels better. Had large BM today after a couple of days. Frequent urination. Still slightly weak. Denies back pain. No dyspnea or chest pain.  Objective: Filed Vitals:   10/17/12 2124 10/18/12 0022 10/18/12 0543 10/18/12 0740  BP:   147/69 159/74  Pulse:  73 68 90  Temp:   98.8 F (37.1 C) 98.7 F (37.1 C)  TempSrc:   Oral Oral  Resp:  17 18 18   Height:      Weight:      SpO2: 97% 98% 100% 100%    Intake/Output Summary (Last 24 hours) at 10/18/12 1320 Last data filed at 10/18/12 1309  Gross per 24 hour  Intake   4485 ml  Output   4933 ml  Net   -448 ml   Filed Weights   10/17/12 1846  Weight: 86.5 kg (190 lb 11.2 oz)    Exam:   General  exam: Comfortable. Obese  Respiratory system: Clear. No increased work of breathing.  Cardiovascular system: S1 & S2 heard, RRR. No JVD, murmurs, gallops, clicks or pedal edema. Telemetry: Sinus rhythm.  Gastrointestinal system: Abdomen is nondistended, soft and nontender. Normal bowel sounds heard.  Central nervous system: Alert and oriented. No focal neurological deficits.  Extremities: Symmetric 5 x 5 power.   Data Reviewed: Basic Metabolic Panel:  Recent Labs Lab 10/17/12 1110 10/17/12 1734 10/17/12 2010 10/18/12 0555  NA 134*  --   --  134*  K 4.2  --   --  4.4  CL 100  --   --  99  CO2 25  --   --  23  GLUCOSE 101*  --   --  415*  BUN 49*  --   --  41*  CREATININE 4.0*  --  3.24* 3.04*  CALCIUM 14.5* PENDING  --  12.5*   Liver Function Tests:  Recent Labs Lab 10/17/12 1734 10/18/12 0555  AST 20 17  ALT 14 17  ALKPHOS 159* 174*  BILITOT 0.3 0.3  PROT 6.7 7.3  ALBUMIN 2.9* 3.1*   No results found for this basename: LIPASE, AMYLASE,  in the last 168 hours No results found for this basename: AMMONIA,  in the last 168 hours CBC:  Recent Labs Lab 10/17/12 1110 10/17/12 2010 10/18/12 0555  WBC 15.3* 14.1* 10.0  NEUTROABS 10.8*  --   --   HGB 12.9 13.0 13.4  HCT 39.1 37.7 39.0  MCV 91.7 88.7 89.0  PLT 443.0* 452* 486*   Cardiac Enzymes: No results found for this basename: CKTOTAL, CKMB, CKMBINDEX, TROPONINI,  in the last 168 hours BNP (last 3 results)  Recent Labs  03/17/12 1236 03/26/12 1118 04/26/12 1002  PROBNP 253.0* 2960.0* 196.0*   CBG:  Recent Labs Lab 10/17/12 1844 10/17/12 2104 10/18/12 0737 10/18/12 1209  GLUCAP 172* 321* 375* 311*    Recent Results (from the past 240 hour(s))  URINE CULTURE     Status: None   Collection Time    10/17/12 11:10 AM      Result Value Range Status   Colony Count 2,000 COLONIES/ML   Final   Organism ID, Bacteria Insignificant Growth   Final     Studies: Dg Chest 2 View  10/17/2012   *RADIOLOGY REPORT*  Clinical Data: History of sarcoidosis, hypertension, kidney failure, asthma, bronchitis, evaluate for pulmonary edema  CHEST - 2 VIEW  Comparison: 06/14/2012; 10/21/2010; CT abdomen pelvis - 07/21/2012  Findings:  Grossly unchanged enlarged cardiac silhouette and mediastinal contours.  There is chronic mild elevation of the right hemidiaphragm.  No focal airspace opacities.  No pleural effusion or pneumothorax.  No definite evidence of pulmonary edema.  Post cholecystectomy.  Surgical clips overlie the expected location of the gastric fundus.  Unchanged bones.  IMPRESSION: No acute cardiopulmonary disease.   Original Report Authenticated By: Tacey Ruiz, MD      Additional labs:   Scheduled Meds: . aspirin EC  81 mg Oral Daily  . calcitonin  4 Units/kg (Order-Specific) Intramuscular Q12H  . cloNIDine  0.1 mg Oral BID  . ferrous sulfate  325 mg Oral QAC breakfast  . fluticasone  2 spray Each  Nare QHS  . furosemide  80 mg Intravenous TID  . guaiFENesin  1,200 mg Oral Daily  . heparin  5,000 Units Subcutaneous Q8H  . insulin aspart  0-20 Units Subcutaneous TID WC  . insulin detemir  40 Units Subcutaneous q morning - 10a  . ketorolac  1 drop Both Eyes TID  . methylPREDNISolone (SOLU-MEDROL) injection  80 mg Intravenous Daily  . metoprolol tartrate  12.5 mg Oral Daily  . pantoprazole  40 mg Oral Daily  . polyethylene glycol  17 g Oral BID  . sodium chloride  1,500 mL Intravenous Once  . sodium chloride  3 mL Intravenous Q12H   Continuous Infusions: . sodium chloride 150 mL/hr at 10/18/12 0831    Principal Problem:   Hypercalcemia Active Problems:   DM   OBESITY   OBSTRUCTIVE SLEEP APNEA   HYPERTENSION   CAD   GERD   HYPERCHOLESTEROLEMIA   ASTHMA   Acute on chronic renal failure   Back pain, chronic    Time spent: 45 minutes.    [facility]  [facility] Pager [phone].   If 8PM-8AM, please contact night-coverage at [email or web address], password  [facility] 10/18/2012, 1:20 PM  LOS: 1 day

## 2012-10-18 NOTE — Telephone Encounter (Signed)
ATC and line has fast busy signal. I tried x 5. wcb

## 2012-10-18 NOTE — Evaluation (Signed)
Physical Therapy Evaluation Patient Details Name: [name] MRN: [id number] DOB: 1945-11-04 Today's Date: 10/18/2012 Time: 4098-1191 PT Time Calculation (min): 18 min  PT Assessment / Plan / Recommendation Clinical Impression  Pt is modified independent with RW and appears to be at baseline.  She expresses no concern about her mobility.  She needs no DME or follow up PT.  Recommend continued ambulation with nursing ad lib    PT Assessment  Patent does not need any further PT services    Follow Up Recommendations  No PT follow up    Does the patient have the potential to tolerate intense rehabilitation      Barriers to Discharge        Equipment Recommendations  None recommended by PT    Recommendations for Other Services     Frequency      Precautions / Restrictions Restrictions Weight Bearing Restrictions: No   Pertinent Vitals/Pain Pt with c/o some back pain with increased walking      Mobility  Bed Mobility Details for Bed Mobility Assistance: Independent per pt report Transfers Transfers: Stand to Sit;Sit to Stand Sit to Stand: 6: Modified independent (Device/Increase time);From toilet;From chair/3-in-1 Stand to Sit: 6: Modified independent (Device/Increase time);To chair/3-in-1 Details for Transfer Assistance: pt has her own RW  Ambulation/Gait Ambulation/Gait Assistance: 6: Modified independent (Device/Increase time) Ambulation Distance (Feet): 200 Feet Assistive device: Rolling walker Ambulation/Gait Assistance Details: pt is steady with RW Gait Pattern: Step-through pattern Gait velocity: self selected pace is slightly decreased but functional General Gait Details: mod I with RW, no loss of balance Stairs: No Wheelchair Mobility Wheelchair Mobility: No    Exercises     PT Diagnosis:    PT Problem List:   PT Treatment Interventions:     PT Goals    Visit Information  Last PT Received On: 10/18/12    Subjective Data  Subjective: My  labs were messed up Patient Stated Goal: to return home   Prior Functioning  Home Living Lives With: Alone Available Help at Discharge: Family;Available PRN/intermittently Type of Home: Apartment Home Access: Level entry Home Adaptive Equipment: Walker - rolling Prior Function Level of Independence: Independent with assistive device(s) Communication Communication: No difficulties    Cognition  Cognition Overall Cognitive Status: Appears within functional limits for tasks assessed/performed Arousal/Alertness: Awake/alert Orientation Level: Appears intact for tasks assessed Behavior During Session: [facility] for tasks performed    Extremity/Trunk Assessment Right Lower Extremity Assessment RLE ROM/Strength/Tone: [facility] for tasks assessed Left Lower Extremity Assessment LLE ROM/Strength/Tone: [facility] for tasks assessed   Balance Balance Balance Assessed: Yes Static Sitting Balance Static Sitting - Balance Support: No upper extremity supported Static Sitting - Level of Assistance: 7: Independent Static Standing Balance Static Standing - Balance Support: Bilateral upper extremity supported Static Standing - Level of Assistance: 6: Modified independent (Device/Increase time)  End of Session PT - End of Session Activity Tolerance: Patient tolerated treatment well Patient left: in chair Nurse Communication: Mobility status  GP    Bayard Hugger. [place], [place] [phone] 10/18/2012, 12:22 PM

## 2012-10-19 LAB — GLUCOSE, CAPILLARY
Glucose-Capillary: 233 mg/dL — ABNORMAL HIGH (ref 70–99)
Glucose-Capillary: 123 mg/dL — ABNORMAL HIGH (ref 70–99)
Glucose-Capillary: 46 mg/dL — ABNORMAL LOW (ref 70–99)
Glucose-Capillary: 294 mg/dL — ABNORMAL HIGH (ref 70–99)

## 2012-10-19 LAB — BASIC METABOLIC PANEL
BUN: 46 mg/dL — ABNORMAL HIGH (ref 6–23)
CO2: 24 mEq/L (ref 19–32)
Calcium: 11.1 mg/dL — ABNORMAL HIGH (ref 8.4–10.5)
Creatinine, Ser: 2.69 mg/dL — ABNORMAL HIGH (ref 0.50–1.10)
GFR calc non Af Amer: 18 mL/min — ABNORMAL LOW (ref >90)
Glucose, Bld: 307 mg/dL — ABNORMAL HIGH (ref 70–99)
Sodium: 134 mEq/L — ABNORMAL LOW (ref 135–145)

## 2012-10-19 MED ORDER — GLUCOSE 40 % PO GEL
1.0000 | ORAL | Status: DC | PRN
  Administered 2012-10-19: 37.5 g via ORAL

## 2012-10-19 MED ORDER — FUROSEMIDE 40 MG PO TABS: 40.0000 mg | ORAL_TABLET | Freq: Every day | ORAL | Status: DC

## 2012-10-19 MED ORDER — INSULIN DETEMIR 100 UNIT/ML ~~LOC~~ SOLN
60.0000 [IU] | Freq: Every morning | SUBCUTANEOUS | Status: DC
  Administered 2012-10-20 – 2012-10-22 (×3): 60 [IU] via SUBCUTANEOUS
  Filled 2012-10-19 (×3): qty 0.6

## 2012-10-19 MED ORDER — PREDNISONE 50 MG PO TABS
50.0000 mg | ORAL_TABLET | Freq: Every day | ORAL | Status: DC
  Filled 2012-10-19: qty 1

## 2012-10-19 MED ORDER — GLUCOSE 40 % PO GEL
ORAL | Status: AC
  Filled 2012-10-19: qty 1

## 2012-10-19 MED ORDER — PREDNISONE 50 MG PO TABS
50.0000 mg | ORAL_TABLET | Freq: Every day | ORAL | Status: DC
  Administered 2012-10-19 – 2012-10-22 (×4): 50 mg via ORAL
  Filled 2012-10-19 (×5): qty 1

## 2012-10-19 MED ORDER — FUROSEMIDE 40 MG PO TABS
40.0000 mg | ORAL_TABLET | Freq: Two times a day (BID) | ORAL | Status: DC
  Administered 2012-10-19 – 2012-10-21 (×5): 40 mg via ORAL
  Filled 2012-10-19 (×8): qty 1

## 2012-10-19 MED ORDER — INSULIN DETEMIR 100 UNIT/ML ~~LOC~~ SOLN
70.0000 [IU] | Freq: Every day | SUBCUTANEOUS | Status: DC
  Administered 2012-10-19: 70 [IU] via SUBCUTANEOUS
  Filled 2012-10-19: qty 0.7

## 2012-10-19 MED ORDER — AMLODIPINE BESYLATE 10 MG PO TABS
10.0000 mg | ORAL_TABLET | Freq: Every day | ORAL | Status: DC
  Administered 2012-10-19 – 2012-10-22 (×4): 10 mg via ORAL
  Filled 2012-10-19 (×4): qty 1

## 2012-10-19 MED ORDER — INSULIN ASPART 100 UNIT/ML ~~LOC~~ SOLN
20.0000 [IU] | Freq: Three times a day (TID) | SUBCUTANEOUS | Status: DC
  Administered 2012-10-20 – 2012-10-22 (×7): 20 [IU] via SUBCUTANEOUS

## 2012-10-19 NOTE — Progress Notes (Signed)
Pt has refused use of cpap tonight bc she does not have her personal mask.  Told her to call RN in she changed her mind and RT would come up.

## 2012-10-19 NOTE — Progress Notes (Signed)
Hypoglycemic Event  CBG: 46  Treatment:  15 GRAMS OF GLUCO GEL AND  8OZ OF SODA Symptoms: LIGHTHEADEDNESS  Follow-up CBG: TIME   1720 CBG Result:   123  Possible Reasons for Event:   T OO MUCH COVERAGE.  Comments/MD notified: YES    Oracio Galen, Kem Kays  Remember to initiate Hypoglycemia Order Set & complete

## 2012-10-19 NOTE — Progress Notes (Signed)
[facility] PROGRESS NOTE  [name] NWG:[id number] DOB: [birth year] DOA: 10/17/2012 PCP: Michele Mcalpine, MD  Brief narrative Patient is a 67 year old African American female with a past medical history of diabetes, hypertension, bronchial asthma, obstructive sleep apnea intermittently compliant with CPAP therapy, chronic back pain for which she gets intermittent as steroid shots by Dr. 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 She has had frequent falls over the past 2 months because of generalized weakness and difficulty ambulating. She claims that she is slightly confused and has had increasing problems with short-term memory. She claims she has not had a bowel movement for the past 1 week. She has had occasional abdominal pain that she cannot describe well. She was seen at her endocrinologist office a few days ago and was told she was high, she was then seen by her primary care practitioner, calcium levels were over 14 creatinine was 4 and she was sent over to the emergency room for further evaluation. [facility] was subsequently asked to admit this patient for further evaluation and treatment   Assessment/Plan: 1. Hypercalcemia: Etiology possibly from sarcoid flare. Per H&P, only organ that had been affected was her spleen and patient is now status post splenectomy.? Other organ involvement now. Improving with treatment. DC calcitonin. Change steroids and Lasix to by mouth. ACE level increased at 80. PTH 23.8. TSH 2.77. Vitamin D 1,25, PTH related peptide-pending. Discussed with primary endocrinologist on 3/19 who thinks that this may be secondary to sarcoidosis. Patient has monoclonal gammopathy for which she sees Dr.  Craig Staggers discuss with him. Corrected Calcium: 12 2. Acute on stage III chronic kidney disease:  suspect related to hypercalcemia. Clinically appears euvolemic. Renal functions continued to improve. Baseline creatinine 1.4-1.7. Follow daily BMP.  3. Uncontrolled DM 2: Precipitated by steroids. Patient claims compliance to high dose Levemir and Apidra mealtime and sliding scale insulin. Will increase these insulins close to her home dose and monitor closely. Hemoglobin A1c 08/11/11 was 9.3. Changing steroids to by mouth at reduced dose should help the 4. Hypertension, mildly uncontrolled: Continue clonidine. Increased metoprolol to twice a day. Amlodipine held consider resuming on discharge.  5. Chronic back pain: May be from hypercalcemia. Improved. 6. CAD: Asymptomatic of chest pain. Continue aspirin. 7. Asthma: Stable. 8. Hyper cholesterolemia: LFTs unremarkable. Resumed statins. 9. OSA: Continue nightly CPAP. 10. GERD: PPI.  Code Status: Full Family Communication: Discussed with patient and sister at bedside. Disposition Plan: Home when medically stable-possibly in the next 24-48 hours.   Consultants:  None  Procedures:  None  Antibiotics:  None   HPI/Subjective: Feels better. Unable to sleep at night-burping. Denies back pain. Occasional dizziness on lying down suddenly and? Restless legs.  Objective: Filed Vitals:   10/18/12 2124 10/19/12 0601 10/19/12 0859 10/19/12 1448  BP: 155/54 175/63 155/58 140/47  Pulse: 67 72  61  Temp: 97.8 F (36.6 C) 98 F (36.7 C) 97.4 F (36.3 C) 97.7 F (36.5 C)  TempSrc: Oral Oral Oral Oral  Resp: 18 18 18 18   Height:      Weight: 86.6 kg (190 lb 14.7 oz)     SpO2: 99% 99% 98% 97%    Intake/Output Summary (Last 24 hours) at 10/19/12 1546 Last data filed at 10/19/12 0745  Gross per 24 hour  Intake 2052.5 ml  Output   4375 ml  Net -2322.5 ml   Filed Weights   10/17/12 1846 10/18/12 2124  Weight: 86.5 kg (190 lb 11.2 oz)  86.6 kg (190 lb 14.7 oz)    Exam:   General exam: Comfortable. Obese  Respiratory system: Clear. No increased work of breathing.  Cardiovascular system: S1 & S2 heard, RRR. No JVD, murmurs, gallops, clicks or pedal edema.   Gastrointestinal system: Abdomen is nondistended, soft and nontender. Normal bowel sounds heard.  Central nervous system: Alert and oriented. No focal neurological deficits.  Extremities: Symmetric 5 x 5 power.   Data Reviewed: Basic Metabolic Panel:  Recent Labs Lab 10/17/12 1110 10/17/12 1734 10/17/12 2010 10/18/12 0555 10/19/12 0450  NA 134*  --   --  134* 134*  K 4.2  --   --  4.4 3.7  CL 100  --   --  99 98  CO2 25  --   --  23 24  GLUCOSE 101*  --   --  415* 307*  BUN 49*  --   --  41* 46*  CREATININE 4.0*  --  3.24* 3.04* 2.69*  CALCIUM 14.5* 12.8*  --  12.5* 11.1*   Liver Function Tests:  Recent Labs Lab 10/17/12 1734 10/18/12 0555 10/19/12 0450  AST 20 17  --   ALT 14 17  --   ALKPHOS 159* 174*  --   BILITOT 0.3 0.3  --   PROT 6.7 7.3  --   ALBUMIN 2.9* 3.1* 2.9*   No results found for this basename: LIPASE, AMYLASE,  in the last 168 hours No results found for this basename: AMMONIA,  in the last 168 hours CBC:  Recent Labs Lab 10/17/12 1110 10/17/12 2010 10/18/12 0555  WBC 15.3* 14.1* 10.0  NEUTROABS 10.8*  --   --   HGB 12.9 13.0 13.4  HCT 39.1 37.7 39.0  MCV 91.7 88.7 89.0  PLT 443.0* 452* 486*   Cardiac Enzymes: No results found for this basename: CKTOTAL, CKMB, CKMBINDEX, TROPONINI,  in the last 168 hours BNP (last 3 results)  Recent Labs  03/17/12 1236 03/26/12 1118 04/26/12 1002  PROBNP 253.0* 2960.0* 196.0*   CBG:  Recent Labs Lab 10/18/12 1209 10/18/12 1651 10/18/12 2123 10/19/12 0732 10/19/12 1222  GLUCAP 311* 312* 172* 246* 233*    Recent Results (from the past 240 hour(s))  URINE CULTURE     Status: None   Collection Time    10/17/12 11:10 AM      Result Value Range Status   Colony  Count 2,000 COLONIES/ML   Final   Organism ID, Bacteria Insignificant Growth   Final     Studies: Dg Chest 2 View  10/17/2012  *RADIOLOGY REPORT*  Clinical Data: History of sarcoidosis, hypertension, kidney failure, asthma, bronchitis, evaluate for pulmonary edema  CHEST - 2 VIEW  Comparison: 06/14/2012; 10/21/2010; CT abdomen pelvis - 07/21/2012  Findings:  Grossly unchanged enlarged cardiac silhouette and mediastinal contours.  There is chronic mild elevation of the right hemidiaphragm.  No focal airspace opacities.  No pleural effusion or pneumothorax.  No definite evidence of pulmonary edema.  Post cholecystectomy.  Surgical clips overlie the expected location of the gastric fundus.  Unchanged bones.  IMPRESSION: No acute cardiopulmonary disease.   Original Report Authenticated By: Tacey Ruiz, MD      Additional labs:   Scheduled Meds: . aspirin EC  81 mg Oral Daily  .  cloNIDine  0.1 mg Oral BID  . ferrous sulfate  325 mg Oral QAC breakfast  . fluticasone  2 spray Each Nare QHS  . furosemide  40 mg Oral BID  . guaiFENesin  1,200 mg Oral Daily  . heparin  5,000 Units Subcutaneous Q8H  . insulin aspart  0-5 Units Subcutaneous QHS  . insulin aspart  0-9 Units Subcutaneous TID WC  . insulin aspart  25 Units Subcutaneous TID WC  . insulin detemir  70 Units Subcutaneous Daily  . ketorolac  1 drop Both Eyes TID  . metoprolol tartrate  12.5 mg Oral BID  . pantoprazole  40 mg Oral Daily  . polyethylene glycol  17 g Oral BID  . predniSONE  50 mg Oral Q breakfast  . sodium chloride  1,500 mL Intravenous Once  . sodium chloride  3 mL Intravenous Q12H   Continuous Infusions: . sodium chloride 150 mL/hr at 10/19/12 1425    Principal Problem:   Hypercalcemia Active Problems:   DM   OBESITY   OBSTRUCTIVE SLEEP APNEA   HYPERTENSION   CAD   GERD   HYPERCHOLESTEROLEMIA   ASTHMA   Acute on chronic renal failure   Back pain, chronic    Time spent: 45  minutes.    [facility]  [facility] Pager [phone].   If 8PM-8AM, please contact night-coverage at [email or web address], password [facility] 10/19/2012, 3:46 PM  LOS: 2 days

## 2012-10-20 ENCOUNTER — Encounter (HOSPITAL_COMMUNITY): Payer: Self-pay | Admitting: Internal Medicine

## 2012-10-20 DIAGNOSIS — I059 Rheumatic mitral valve disease, unspecified: Secondary | ICD-10-CM

## 2012-10-20 DIAGNOSIS — R000 Tachycardia, unspecified: Secondary | ICD-10-CM

## 2012-10-20 DIAGNOSIS — I472 Ventricular tachycardia: Secondary | ICD-10-CM | POA: Clinically undetermined

## 2012-10-20 LAB — BASIC METABOLIC PANEL
GFR calc Af Amer: 26 mL/min — ABNORMAL LOW (ref >90)
GFR calc non Af Amer: 22 mL/min — ABNORMAL LOW (ref >90)
Potassium: 3.8 mEq/L (ref 3.5–5.1)
Sodium: 137 mEq/L (ref 135–145)

## 2012-10-20 LAB — GLUCOSE, CAPILLARY: Glucose-Capillary: 225 mg/dL — ABNORMAL HIGH (ref 70–99)

## 2012-10-20 LAB — ALBUMIN: Albumin: 2.9 g/dL — ABNORMAL LOW (ref 3.5–5.2)

## 2012-10-20 MED ORDER — METOPROLOL TARTRATE 12.5 MG HALF TABLET
12.5000 mg | ORAL_TABLET | Freq: Every day | ORAL | Status: DC
  Filled 2012-10-20: qty 1

## 2012-10-20 MED ORDER — SENNA 8.6 MG PO TABS
2.0000 | ORAL_TABLET | Freq: Every day | ORAL | Status: DC
  Administered 2012-10-20 – 2012-10-22 (×3): 17.2 mg via ORAL
  Filled 2012-10-20 (×3): qty 2

## 2012-10-20 MED ORDER — MAGNESIUM SULFATE 40 MG/ML IJ SOLN
2.0000 g | Freq: Once | INTRAMUSCULAR | Status: AC
  Administered 2012-10-20: 2 g via INTRAVENOUS
  Filled 2012-10-20: qty 50

## 2012-10-20 NOTE — Progress Notes (Addendum)
[facility] PROGRESS NOTE  [name] Kil RUE:[id number] DOB: [birth year] DOA: 10/17/2012 PCP: Michele Mcalpine, MD  Brief narrative Patient is a 67 year old African American female with a past medical history of diabetes, hypertension, bronchial asthma, obstructive sleep apnea intermittently compliant with CPAP therapy, chronic back pain for which she gets intermittent as steroid shots by Dr. 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 She has had frequent falls over the past 2 months because of generalized weakness and difficulty ambulating. She claims that she is slightly confused and has had increasing problems with short-term memory. She claims she has not had a bowel movement for the past 1 week. She has had occasional abdominal pain that she cannot describe well. She was seen at her endocrinologist office a few days ago and was told she was high, she was then seen by her primary care practitioner, calcium levels were over 14 creatinine was 4 and she was sent over to the emergency room for further evaluation. [facility] was subsequently asked to admit this patient for further evaluation and treatment   Assessment/Plan: 1. Hypercalcemia: Etiology possibly from sarcoid flare. Per H&P, only organ that had been affected was her spleen and patient is now status post splenectomy.? Other organ involvement now. Improving with treatment. DCed calcitonin. Changed steroids and Lasix to by mouth. ACE level increased at 80. PTH 23.8. TSH 2.77. Vitamin D 1,25, PTH related peptide-pending. Discussed with primary endocrinologist on 3/19 who thinks that this may be secondary to sarcoidosis. Patient has monoclonal gammopathy for which she sees Dr.  Craig Staggers discuss with him. UnCorrected Calcium: 10 2. Acute on stage III chronic kidney disease:  suspect related to hypercalcemia. Clinically appears euvolemic. Renal functions continue to improve - 2.21. Baseline creatinine 1.4-1.7. Follow daily BMP.  3. Uncontrolled DM 2: Precipitated by steroids. Patient claims compliance to high dose Levemir and Apidra mealtime and sliding scale insulin. Hemoglobin A1c 08/11/11 was 9.3. Changed steroids to by mouth at reduced dose. Went hypoglycemic 3/20 afternoon. Cut back on insulins- monitor. 4. Hypertension, mildly uncontrolled: Continue clonidine. Increased metoprolol to twice a day. Amlodipine resumed. Better control. 5. Nonsustained wide complex tachycardia: occured overnight 3/20. Card input appreciated- suspect atrial arrythmia with aberrancy. Replete Mg. F/U Echo. Telemetry does not show any further episodes but periodic sinus bradycardia in the 40s (asymptomatic). Will reduce metoprolol to prior home dose of 12.5 daily. Patient also on clonidine which can contribute to bradycardia. 6. Hypomagnesemia: S/P 2g IV- f/u Mg this am and replete as needed. 7. Acute on Chronic back pain: May be from hypercalcemia. Resolved. 8. CAD: Asymptomatic of chest pain. Continue aspirin. 9. Asthma: Stable. 10. Hyper cholesterolemia: LFTs unremarkable. Resumed statins. 11. OSA: Continue nightly CPAP. 12. GERD: PPI.  Code Status: Full Family Communication: Discussed with patient. Disposition Plan: Home when medically stable-possibly in the next 24 hours.   Consultants:  Cardiology  Procedures:  None  Antibiotics:  None   HPI/Subjective: Denies complaints.No back pain. Still constipated. Stronger. NO CP/SOB.  Objective: Filed Vitals:   10/19/12 1448 10/19/12 1718 10/19/12 2100 10/20/12 0553  BP: 140/47 147/53 164/82 143/67  Pulse: 61 51 50 43  Temp: 97.7 F (36.5 C) 97.7 F (36.5 C) 97.2 F (36.2 C) 97.8 F (36.6 C)  TempSrc: Oral Oral  Oral  Oral  Resp: 18 18 18 18   Height:      Weight:   89.3 kg (196 lb 13.9 oz)   SpO2: 97% 94% 98% 100%    Intake/Output Summary (Last 24 hours) at 10/20/12 0927 Last data filed at 10/20/12 0735  Gross per 24 hour  Intake      0 ml  Output   2500 ml  Net  -2500 ml   Filed Weights   10/17/12 1846 10/18/12 2124 10/19/12 2100  Weight: 86.5 kg (190 lb 11.2 oz) 86.6 kg (190 lb 14.7 oz) 89.3 kg (196 lb 13.9 oz)    Exam:   General exam: Comfortable. Obese. Sitting up on chair eating breakfast.  Respiratory system: Clear. No increased work of breathing.  Cardiovascular system: S1 & S2 heard, RRR. No JVD, murmurs, gallops, clicks or pedal edema.   Gastrointestinal system: Abdomen is nondistended, soft and nontender. Normal bowel sounds heard.  Central nervous system: Alert and oriented. No focal neurological deficits.  Extremities: Symmetric 5 x 5 power.   Data Reviewed: Basic Metabolic Panel:  Recent Labs Lab 10/17/12 1110 10/17/12 1734 10/17/12 2010 10/18/12 0555 10/19/12 0450 10/19/12 2319 10/20/12 0442  NA 134*  --   --  134* 134*  --  137  K 4.2  --   --  4.4 3.7  --  3.8  CL 100  --   --  99 98  --  101  CO2 25  --   --  23 24  --  24  GLUCOSE 101*  --   --  415* 307*  --  211*  BUN 49*  --   --  41* 46*  --  42*  CREATININE 4.0*  --  3.24* 3.04* 2.69*  --  2.21*  CALCIUM 14.5* 12.8*  --  12.5* 11.1*  --  10.0  MG  --   --   --   --   --  1.0*  --    Liver Function Tests:  Recent Labs Lab 10/17/12 1734 10/18/12 0555 10/19/12 0450 10/20/12 0442  AST 20 17  --   --   ALT 14 17  --   --   ALKPHOS 159* 174*  --   --   BILITOT 0.3 0.3  --   --   PROT 6.7 7.3  --   --   ALBUMIN 2.9* 3.1* 2.9* 2.9*   No results found for this basename: LIPASE, AMYLASE,  in the last 168 hours No results found for this basename: AMMONIA,  in the last 168 hours CBC:  Recent Labs Lab 10/17/12 1110 10/17/12 2010 10/18/12 0555  WBC 15.3* 14.1* 10.0  NEUTROABS 10.8*  --   --    HGB 12.9 13.0 13.4  HCT 39.1 37.7 39.0  MCV 91.7 88.7 89.0  PLT 443.0* 452* 486*   Cardiac Enzymes: No results found for this basename: CKTOTAL, CKMB, CKMBINDEX, TROPONINI,  in the last 168 hours BNP (last 3 results)  Recent Labs  03/17/12 1236 03/26/12 1118 04/26/12 1002  PROBNP 253.0* 2960.0* 196.0*   CBG:  Recent Labs Lab 10/19/12 1222 10/19/12 1639 10/19/12 1720 10/19/12 2240 10/20/12 0734  GLUCAP 233* 46* 123* 294* 169*    Recent Results (from the past 240 hour(s))  URINE CULTURE     Status: None   Collection Time    10/17/12 11:10 AM      Result Value Range Status   Colony Count 2,000 COLONIES/ML   Final  Organism ID, Bacteria Insignificant Growth   Final     Studies: No results found.   Additional labs:   Scheduled Meds: . amLODipine  10 mg Oral Daily  . aspirin EC  81 mg Oral Daily  . cloNIDine  0.1 mg Oral BID  . ferrous sulfate  325 mg Oral QAC breakfast  . fluticasone  2 spray Each Nare QHS  . furosemide  40 mg Oral BID  . guaiFENesin  1,200 mg Oral Daily  . heparin  5,000 Units Subcutaneous Q8H  . insulin aspart  0-5 Units Subcutaneous QHS  . insulin aspart  0-9 Units Subcutaneous TID WC  . insulin aspart  20 Units Subcutaneous TID WC  . insulin detemir  60 Units Subcutaneous q morning - 10a  . ketorolac  1 drop Both Eyes TID  . metoprolol tartrate  12.5 mg Oral BID  . pantoprazole  40 mg Oral Daily  . polyethylene glycol  17 g Oral BID  . predniSONE  50 mg Oral Q breakfast  . sodium chloride  1,500 mL Intravenous Once  . sodium chloride  3 mL Intravenous Q12H   Continuous Infusions: . sodium chloride 150 mL/hr at 10/20/12 0605    Principal Problem:   Hypercalcemia Active Problems:   DM   OBESITY   OBSTRUCTIVE SLEEP APNEA   HYPERTENSION   CAD   GERD   HYPERCHOLESTEROLEMIA   ASTHMA   Acute on chronic renal failure   Back pain, chronic    Time spent: 45 minutes.    [facility]  [facility] Pager  [phone].   If 8PM-8AM, please contact night-coverage at [email or web address], password [facility] 10/20/2012, 9:27 AM  LOS: 3 days

## 2012-10-20 NOTE — Progress Notes (Signed)
*  PRELIMINARY RESULTS* Echocardiogram 2D Echocardiogram has been performed.  [name] 10/20/2012, 4:34 PM

## 2012-10-20 NOTE — Progress Notes (Addendum)
Pt. Refused cpap tonight. Pt. states She does not like the mask and prefers her own mask from home. RT informed pt. To notify if she changes her mind.

## 2012-10-20 NOTE — Progress Notes (Signed)
10/20/2012 5:35 PM  Pt called RN into room to discuss discharge options.  Pt brother also present at the time of conversation.  Pt requested to see a Child psychotherapist regarding discharge options.  She states that she has had some falls at home and although she did okay with PT during their last session, she does have some concerns for her safety.  Brother at the bedside added that sometimes the patient does not fully express what is really going on at home, and feels that she is in denial of not being able to take care of herself adequately at home, especially since she has had multiple falls in which someone has luckily stopped by and found her, uncertainty if she can prepare meals adequately, and uncertainty if she can take her meds properly.  I asked the patient if his concerns were valid, and the patient stated that they were, and that if necessary depending on what PT says she is willing to go to a facility for rehab, have home health PT, or be given information on ALF facilities in the area as well as information regarding the [facility] program.  I called Dr. Waymon Amato and informed him of the patient's concerns, and orders were received for CSW consult and another PT eval.  PT was notified and a message was left for the weekend PT on call, as well as the pager--however no response as of yet.  I also paged the oncall SW with no response as of yet either.  Charge RN notified of situation and oncoming nurse informed of dc concerns so that this issue can be followed up with in a timely manner tomorrow.  Pt and brother informed of progress.  Will continue to monitor situation.   Eunice Blase

## 2012-10-20 NOTE — Consult Note (Addendum)
Cardiology IP Consult  Reason for Consult: possible VT Referring Physician: Leia Alf  HPI: Ms. Overdorf is a 67 y.o.female with sarcoidosis (pulm, spleen) and CKD as well as nonobstructive CAD who is admitted for worsening hypercalcemia to [facility].  Tonight she had an episode of 30 beats of wide complex tachycardia.  She was asymptomatic.  She has never had palpitations before and has never passed out.  She has never had cardiac involvement of her sarcoid.  She follows with Dr. Daleen Squibb for nonobstructive CAD by [facility] in 2007 and underwent an echo 1 year ago that showed good LV systolic function and no valvular abnormality.  She is treated with chronic prednisone for her hypercalcemia secondary to sarcoid.  At admission she was having slight confusion and constipation.     Past Medical History  Diagnosis Date  . Unspecified essential hypertension   . CAD (coronary artery disease)   . Gastritis   . Asthma   . OSA (obstructive sleep apnea)   . Esophageal reflux   . Obesity, unspecified   . Degenerative joint disease   . Anxiety state, unspecified   . Hypercalcemia   . Anemia   . Other psoriasis   . Diverticulosis   . Sarcoidosis   . Diabetes mellitus   . Kidney disease   . Dyslipidemia   . PNA (pneumonia)     With pleural effusion, requiring thoracentesis  . Pleural effusion   . COPD (chronic obstructive pulmonary disease)   . Gout     Past Surgical History  Procedure Laterality Date  . Cholecystectomy    . Vesicovaginal fistula closure w/  total abdominal hysterectomy    . Bilateral total knee replacements  Rt=5/04 & Lft=1/09    by DrAlusio  . Tonsillectomy    . Appendectomy    . Abdominal hysterectomy    . Breast reduction surgery  1982  . Open splenectomy  [id number]    by DrRosenbower    Family History  Problem Relation Age of Onset  . Diabetes Mother   . Breast cancer Maternal Aunt     Social History:  reports that she has never smoked. She has never  used smokeless tobacco. She reports that she does not drink alcohol or use illicit drugs.  Allergies:  Allergies  Allergen Reactions  . Adhesive (Tape) Other (See Comments)    Blisters   . Avelox (Moxifloxacin Hcl In Nacl)     GI upset  . Codeine Other (See Comments)    "crazy"   . Guaifenesin Nausea And Vomiting  . Latex Swelling  . Oxycodone Nausea And Vomiting    Current Facility-Administered Medications  Medication Dose Route Frequency Provider Last Rate Last Dose  . 0.9 %  sodium chloride infusion   Intravenous Continuous Maretta Bees, MD 150 mL/hr at 10/19/12 1425    . acetaminophen (TYLENOL) tablet 650 mg  650 mg Oral Q6H PRN Shanker Levora Dredge, MD       Or  . acetaminophen (TYLENOL) suppository 650 mg  650 mg Rectal Q6H PRN Shanker Levora Dredge, MD      . albuterol (PROVENTIL) (5 MG/ML) 0.5% nebulizer solution 2.5 mg  2.5 mg Nebulization Q2H PRN Shanker Levora Dredge, MD      . alum & mag hydroxide-simeth (MAALOX/MYLANTA) 200-200-20 MG/5ML suspension 30 mL  30 mL Oral Q6H PRN Maretta Bees, MD      . amLODipine (NORVASC) tablet 10 mg  10 mg Oral Daily Elease Etienne, MD  10 mg at 10/19/12 1845  . aspirin EC tablet 81 mg  81 mg Oral Daily Maretta Bees, MD   81 mg at 10/19/12 1028  . cloNIDine (CATAPRES) tablet 0.1 mg  0.1 mg Oral BID Maretta Bees, MD   0.1 mg at 10/19/12 2258  . dextrose (GLUTOSE) 40 % oral gel 37.5 g  1 Tube Oral PRN Elease Etienne, MD   37.5 g at 10/19/12 1640  . dextrose (GLUTOSE) 40 % oral gel           . diphenhydrAMINE (BENADRYL) capsule 25 mg  25 mg Oral Q8H PRN Leda Gauze, NP   25 mg at 10/18/12 2329  . ferrous sulfate tablet 325 mg  325 mg Oral QAC breakfast Maretta Bees, MD   325 mg at 10/19/12 0841  . fluticasone (FLONASE) 50 MCG/ACT nasal spray 2 spray  2 spray Each Nare QHS Maretta Bees, MD   2 spray at 10/19/12 2200  . furosemide (LASIX) tablet 40 mg  40 mg Oral BID Elease Etienne, MD   40 mg at 10/19/12  1837  . guaiFENesin (MUCINEX) 12 hr tablet 1,200 mg  1,200 mg Oral Daily Maretta Bees, MD   1,200 mg at 10/19/12 1029  . guaiFENesin-dextromethorphan (ROBITUSSIN DM) 100-10 MG/5ML syrup 5 mL  5 mL Oral Daily PRN Maretta Bees, MD      . heparin injection 5,000 Units  5,000 Units Subcutaneous Q8H Maretta Bees, MD   5,000 Units at 10/19/12 2259  . HYDROcodone-acetaminophen (NORCO/VICODIN) 5-325 MG per tablet 1-2 tablet  1-2 tablet Oral Q4H PRN Maretta Bees, MD      . insulin aspart (novoLOG) injection 0-5 Units  0-5 Units Subcutaneous QHS Elease Etienne, MD   3 Units at 10/19/12 2257  . insulin aspart (novoLOG) injection 0-9 Units  0-9 Units Subcutaneous TID WC Elease Etienne, MD   1 Units at 10/19/12 1610  . insulin aspart (novoLOG) injection 20 Units  20 Units Subcutaneous TID WC Elease Etienne, MD      . insulin detemir (LEVEMIR) injection 60 Units  60 Units Subcutaneous q morning - 10a Elease Etienne, MD      . ketorolac (ACULAR) 0.5 % ophthalmic solution 1 drop  1 drop Both Eyes TID Maretta Bees, MD   1 drop at 10/19/12 2259  . LORazepam (ATIVAN) tablet 1 mg  1 mg Oral Q8H PRN Maretta Bees, MD      . metoprolol tartrate (LOPRESSOR) tablet 12.5 mg  12.5 mg Oral BID Elease Etienne, MD   12.5 mg at 10/19/12 2318  . ondansetron (ZOFRAN) tablet 4 mg  4 mg Oral Q6H PRN Maretta Bees, MD       Or  . ondansetron [facility]) injection 4 mg  4 mg Intravenous Q6H PRN Maretta Bees, MD   4 mg at 10/18/12 0003  . pantoprazole (PROTONIX) EC tablet 40 mg  40 mg Oral Daily Maretta Bees, MD   40 mg at 10/19/12 1034  . polyethylene glycol (MIRALAX / GLYCOLAX) packet 17 g  17 g Oral BID Maretta Bees, MD   17 g at 10/19/12 2258  . predniSONE (DELTASONE) tablet 50 mg  50 mg Oral Q breakfast Elease Etienne, MD   50 mg at 10/19/12 1246  . sodium chloride 0.9 % bolus 1,500 mL  1,500 mL Intravenous Once Maretta Bees, MD      .  sodium chloride 0.9 % injection  3 mL  3 mL Intravenous Q12H Maretta Bees, MD   3 mL at 10/19/12 1032    ROS: A full review of systems is obtained and is negative except as noted in the HPI.  Physical Exam: Blood pressure 164/82, pulse 50, temperature 97.2 F (36.2 C), temperature source Oral, resp. rate 18, height 5\' 2"  (1.575 m), weight 89.3 kg (196 lb 13.9 oz), SpO2 98.00%.  GENERAL: no acute distress, obese  EYES: Extra ocular movements are intact. There is no lid lag. Sclera is anicteric.  ENT: Oropharynx is clear. Dentition is within normal limits.  NECK: Supple. The thyroid is not enlarged.  HEART: Regular rate and rhythm with no m/g/r.  Normal S1/S2.  LUNGS: Clear to auscultation There are no rales, rhonchi, or wheezes.  ABDOMEN: Soft, non-tender, and non-distended with normoactive bowel sounds. T  EXTREMITIES: minimal edema, rash on LE.  PULSES: DP/PT pulses were +2 and equal bilaterally.  SKIN: Warm, dry, and intact.  NEUROLOGIC: The patient was oriented to person, place, and time. No overt neurologic deficits were detected.  PSYCH: Normal judgment and insight, mood is appropriate.    Results: Results for orders placed during the hospital encounter of 10/17/12 (from the past 24 hour(s))  BASIC METABOLIC PANEL     Status: Abnormal   Collection Time    10/19/12  4:50 AM      Result Value Range   Sodium 134 (*) 135 - 145 mEq/L   Potassium 3.7  3.5 - 5.1 mEq/L   Chloride 98  96 - 112 mEq/L   CO2 24  19 - 32 mEq/L   Glucose, Bld 307 (*) 70 - 99 mg/dL   BUN 46 (*) 6 - 23 mg/dL   Creatinine, Ser 1.61 (*) 0.50 - 1.10 mg/dL   Calcium 09.6 (*) 8.4 - 10.5 mg/dL   GFR calc non Af Amer 18 (*) >90 mL/min   GFR calc Af Amer 20 (*) >90 mL/min  ALBUMIN     Status: Abnormal   Collection Time    10/19/12  4:50 AM      Result Value Range   Albumin 2.9 (*) 3.5 - 5.2 g/dL  GLUCOSE, CAPILLARY     Status: Abnormal   Collection Time    10/19/12  7:32 AM      Result Value Range   Glucose-Capillary 246 (*) 70 - 99  mg/dL  GLUCOSE, CAPILLARY     Status: Abnormal   Collection Time    10/19/12 12:22 PM      Result Value Range   Glucose-Capillary 233 (*) 70 - 99 mg/dL  GLUCOSE, CAPILLARY     Status: Abnormal   Collection Time    10/19/12  4:39 PM      Result Value Range   Glucose-Capillary 46 (*) 70 - 99 mg/dL  GLUCOSE, CAPILLARY     Status: Abnormal   Collection Time    10/19/12  5:20 PM      Result Value Range   Glucose-Capillary 123 (*) 70 - 99 mg/dL  GLUCOSE, CAPILLARY     Status: Abnormal   Collection Time    10/19/12 10:40 PM      Result Value Range   Glucose-Capillary 294 (*) 70 - 99 mg/dL  MAGNESIUM     Status: Abnormal   Collection Time    10/19/12 11:19 PM      Result Value Range   Magnesium 1.0 (*) 1.5 - 2.5 mg/dL  CXR: no acute changes EKG: sinus brady, no STT changes, smaller voltage in limb leads than prior Tele: 30beats of wide complex tachycardia  Assessment/Plan: 1. Wide Complex tachycardia: by review of tele I suspect this was an atrial arrythmia with aberrancy as it is irregular and is not similar in morphology to her other PVCs.   - she has not had cardiac involvement of her sarcoid before but would not be unreasonable to repeat a 2D echo - continue telemetry - replace mag 2. Hypercalcemia:  - currently being treated with lasix and fluid 3. HTN:  - titrating meds  BUTLER,MICHAEL 10/20/2012, 12:48 AM    Cardiology Attending Note, clinical data and telemetry tracings reviewed.  A few runs of wide-complex tachycardia, almost certainly VT recorded when electrolytes fairly normal.  Mostly sinus bradycardia with some prominent sinus arrhythmia.  Presence of VT raises question of cardiac sarcoid.  I will ask EP to evaluate.  Ringgold Bing, MD 10/20/2012, 8:35 PM

## 2012-10-21 ENCOUNTER — Other Ambulatory Visit: Payer: Self-pay | Admitting: Physician Assistant

## 2012-10-21 DIAGNOSIS — R001 Bradycardia, unspecified: Secondary | ICD-10-CM | POA: Diagnosis present

## 2012-10-21 DIAGNOSIS — I472 Ventricular tachycardia: Secondary | ICD-10-CM

## 2012-10-21 DIAGNOSIS — N179 Acute kidney failure, unspecified: Principal | ICD-10-CM

## 2012-10-21 LAB — VITAMIN D 1,25 DIHYDROXY
Vitamin D 1, 25 (OH)2 Total: 92 pg/mL — ABNORMAL HIGH (ref 18–72)
Vitamin D3 1, 25 (OH)2: 59 pg/mL

## 2012-10-21 LAB — GLUCOSE, CAPILLARY
Glucose-Capillary: 354 mg/dL — ABNORMAL HIGH (ref 70–99)
Glucose-Capillary: 246 mg/dL — ABNORMAL HIGH (ref 70–99)
Glucose-Capillary: 252 mg/dL — ABNORMAL HIGH (ref 70–99)

## 2012-10-21 LAB — BASIC METABOLIC PANEL
CO2: 25 mEq/L (ref 19–32)
Chloride: 105 mEq/L (ref 96–112)
Glucose, Bld: 102 mg/dL — ABNORMAL HIGH (ref 70–99)
Sodium: 141 mEq/L (ref 135–145)

## 2012-10-21 LAB — MAGNESIUM: Magnesium: 1.2 mg/dL — ABNORMAL LOW (ref 1.5–2.5)

## 2012-10-21 MED ORDER — CLONIDINE HCL 0.1 MG PO TABS
0.1000 mg | ORAL_TABLET | Freq: Two times a day (BID) | ORAL | Status: DC
Start: 2012-10-21 — End: 2012-10-22
  Administered 2012-10-21 – 2012-10-22 (×3): 0.1 mg via ORAL
  Filled 2012-10-21 (×4): qty 1

## 2012-10-21 MED ORDER — MAGNESIUM SULFATE 40 MG/ML IJ SOLN
4.0000 g | Freq: Once | INTRAMUSCULAR | Status: AC
  Administered 2012-10-21: 4 g via INTRAVENOUS
  Filled 2012-10-21: qty 100

## 2012-10-21 MED ORDER — METOPROLOL TARTRATE 12.5 MG HALF TABLET
12.5000 mg | ORAL_TABLET | Freq: Every day | ORAL | Status: DC
  Administered 2012-10-22: 12.5 mg via ORAL
  Filled 2012-10-21 (×2): qty 1

## 2012-10-21 MED ORDER — POTASSIUM CHLORIDE CRYS ER 20 MEQ PO TBCR
40.0000 meq | EXTENDED_RELEASE_TABLET | Freq: Once | ORAL | Status: AC
  Administered 2012-10-21: 40 meq via ORAL
  Filled 2012-10-21: qty 2

## 2012-10-21 NOTE — Progress Notes (Signed)
Patient ID: [name], female   DOB: 1945-12-20, 67 y.o.   MRN: [id number]   SUBJECTIVE:  The patient is seen and examined. I spent greater than 45 minutes reviewing the records and speaking to other physicians. I spoke with Dr. Al Corpus. I spoke also with Dr. Johney Frame of electrophysiology.  The patient was admitted with hypercalcemia and acute on chronic renal failure. These abnormalities are being treated and the patient is stabilizing. Creatinine has come back down to the range of 1.9 which is her baseline. There are 2 ongoing cardiac issues. The patient has bradycardia. Review of the records reveal that her heart rate has always been in the range of 60 as an outpatient. She is on clonidine. The dose has been reduced slightly. She is also on the beta blocker. This dose had been increased slightly in the hospital because of her hypertension. On the bradycardia was noted, the dose was changed back to her dose of 12.5 twice a day. The patient's blood pressure is stable. She has not had syncope or presyncope. She has never noted prolonged palpitations.  The patient did have 30 beats of wide complex tachycardia. This rhythm has been reviewed by Dr. Johney Frame. He agrees that it is ventricular tachycardia. She has had a 2-D echo this admission. The study is technically extremely difficult. It is thought that her systolic function is probably maintained with an EF that is likely to be in the 50-55% range.  Historically there has been a history of nonobstructive coronary disease in the past.   Filed Vitals:   10/20/12 2018 10/21/12 0405 10/21/12 0512 10/21/12 0900  BP: 158/75 167/55 160/71 168/79  Pulse: 47 42 56 59  Temp: 97.4 F (36.3 C)  97.9 F (36.6 C) 97.4 F (36.3 C)  TempSrc: Oral  Oral Oral  Resp: 16  18 18   Height:      Weight:      SpO2: 100% 100% 98% 99%    Intake/Output Summary (Last 24 hours) at 10/21/12 0957 Last data filed at 10/21/12 0830  Gross per 24 hour  Intake 3977.5 ml   Output   3601 ml  Net  376.5 ml    LABS: Basic Metabolic Panel:  Recent Labs  08/03/70 0442 10/21/12 0545  NA 137 141  K 3.8 3.1*  CL 101 105  CO2 24 25  GLUCOSE 211* 102*  BUN 42* 38*  CREATININE 2.21* 1.96*  CALCIUM 10.0 9.4  MG 1.7 1.2*   Liver Function Tests:  Recent Labs  10/19/12 0450 10/20/12 0442  ALBUMIN 2.9* 2.9*   No results found for this basename: LIPASE, AMYLASE,  in the last 72 hours CBC: No results found for this basename: WBC, NEUTROABS, HGB, HCT, MCV, PLT,  in the last 72 hours Cardiac Enzymes: No results found for this basename: CKTOTAL, CKMB, CKMBINDEX, TROPONINI,  in the last 72 hours BNP: No components found with this basename: POCBNP,  D-Dimer: No results found for this basename: DDIMER,  in the last 72 hours Hemoglobin A1C: No results found for this basename: HGBA1C,  in the last 72 hours Fasting Lipid Panel: No results found for this basename: CHOL, HDL, LDLCALC, TRIG, CHOLHDL, LDLDIRECT,  in the last 72 hours Thyroid Function Tests: No results found for this basename: TSH, T4TOTAL, FREET3, T3FREE, THYROIDAB,  in the last 72 hours  RADIOLOGY: Dg Chest 2 View  10/17/2012  *RADIOLOGY REPORT*  Clinical Data: History of sarcoidosis, hypertension, kidney failure, asthma, bronchitis, evaluate for pulmonary edema  CHEST -  2 VIEW  Comparison: 06/14/2012; 10/21/2010; CT abdomen pelvis - 07/21/2012  Findings:  Grossly unchanged enlarged cardiac silhouette and mediastinal contours.  There is chronic mild elevation of the right hemidiaphragm.  No focal airspace opacities.  No pleural effusion or pneumothorax.  No definite evidence of pulmonary edema.  Post cholecystectomy.  Surgical clips overlie the expected location of the gastric fundus.  Unchanged bones.  IMPRESSION: No acute cardiopulmonary disease.   Original Report Authenticated By: Tacey Ruiz, MD     PHYSICAL EXAM   The patient is overweight. She is oriented to person time and place. Affect  is normal. There is no jugular venous distention. Lungs reveal a few scattered rhonchi. Cardiac exam reveals S1 and S2. There no clicks or significant murmurs. The abdomen is soft. There is no peripheral edema.   TELEMETRY:  I have reviewed telemetry today October 21, 2012. There is sinus rhythm with sinus bradycardia. There has been no further wide-complex tachycardia since it was noted the other day.   ASSESSMENT AND PLAN:  Principal Problem:   Hypercalcemia     This problem is being successfully treated here in the hospital. Active Problems:   DM   OBESITY   OBSTRUCTIVE SLEEP APNEA   HYPERTENSION   CAD    The patient's coronary disease has been stable. However she's had a documented episode of wide-complex tachycardia. Therefore we will arrange for an outpatient Myoview scan to further risk stratify her from the viewpoint of her coronary disease.     Acute on chronic renal failure     It appears that her renal function is now back to baseline. However she has significant renal disease. This will complicate the issue of the workup of her cardiac status since she cannot receive gadolinium.    Ventricular tachycardia, nonsustained     It appears that she had 30 beats of ventricular tachycardia. She has never had significant symptoms. The evaluation of this problem will be very complex. With her history of sarcoid disease we need to try to decide if she has cardiac sarcoid. This will be very difficult as we cannot do an MRI. Gadolinium could not be given because of her renal insufficiency. The first step will be to rule out significant ongoing ischemia. We will also arrange for the patient's to see Dr. Johney Frame as an outpatient. With the information available he will try to then help to decide how we should proceed. It is possible that EP study may be needed. It is possible that other modalities will have to be researched concerning how we would rule out cardiac sarcoid.    Bradycardia     The  patient does have bradycardia. He does not appear to be symptomatic at this time. It will be optimal to use the lowest dose as possible of the medications that could further slow her heart rate. She is on a very small dose of metoprolol. She's not having significant ischemic symptoms. However I'm hesitant to completely stop her beta blocker with her episode of ventricular tachycardia. It would be helpful to try to wean her clonidine further. Of course this cannot be done too rapidly. If other medications can be substituted this will be helpful.  If it is felt from a medical viewpoint that the patient can be discharged, it will be okay to discharge her from the cardiac viewpoint. We will take care of arranging her followup  Myoview scan and appointment with Dr. Johney Frame.   Willa Rough 10/21/2012 9:57 AM

## 2012-10-21 NOTE — Progress Notes (Signed)
Patient's HR intermittently drops to the 36-39 then goes back up to the 40's.Patient asymptomatic,denies any c/o.BP 167/55.MD on call T. Callahan,NP notified,he will place parameters on BP meds.Will continue to monitor. Inola Lisle Joselita,RN

## 2012-10-21 NOTE — Progress Notes (Signed)
[facility] PROGRESS NOTE  [name] Kil GNF:[id number] DOB: [birth year] DOA: 10/17/2012 PCP: Michele Mcalpine, MD  Brief narrative Patient is a 67 year old African American female with a past medical history of diabetes, hypertension, bronchial asthma, obstructive sleep apnea intermittently compliant with CPAP therapy, chronic back pain for which she gets intermittent as steroid shots by Dr. 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 She has had frequent falls over the past 2 months because of generalized weakness and difficulty ambulating. She claims that she is slightly confused and has had increasing problems with short-term memory. She claims she has not had a bowel movement for the past 1 week. She has had occasional abdominal pain that she cannot describe well. She was seen at her endocrinologist office a few days ago and was told she was high, she was then seen by her primary care practitioner, calcium levels were over 14 creatinine was 4 and she was sent over to the emergency room for further evaluation. [facility] was subsequently asked to admit this patient for further evaluation and treatment   Assessment/Plan: 1. Hypercalcemia: Etiology possibly from sarcoid flare. Per H&P, only organ that had been affected was her spleen and patient is now status post splenectomy.? Other organ involvement now. Improving with treatment. DCed calcitonin. Changed steroids and Lasix to by mouth. ACE level increased at 80. PTH 23.8. TSH 2.77. Vitamin D 1,25, PTH related peptide-pending. Discussed with primary endocrinologist on 3/19 who thinks that this may be secondary to sarcoidosis. Patient has monoclonal gammopathy for which she sees Dr.  Estelle June follow up with him as OP. UnCorrected Calcium: 9.4 2. Acute on stage III chronic kidney disease:  suspect related to hypercalcemia. Clinically appears euvolemic. Renal functions continue to improve - 1.96. Baseline creatinine 1.4-1.7. Follow BMP in a.m. Discontinue IV fluids and Lasix.  3. Uncontrolled DM 2: Precipitated by steroids. Patient claims compliance to high dose Levemir and Apidra mealtime and sliding scale insulin. Hemoglobin A1c 08/11/11 was 9.3. Changed steroids to by mouth at reduced dose. Went hypoglycemic 3/20 afternoon. Cut back on insulins- monitor- has fluctuating CBG's. 4. Hypertension, mildly uncontrolled: Continue clonidine at home dose 0.2 mg bid. Amlodipine resumed. Metoprolol reduced to 2.5 mg daily (home dose) secondary to bradycardia. 5. Sinus bradycardia in the 40s: Asymptomatic. Consider gradually tapering clonidine to discontinue as outpatient. Cardiology followup appreciated. 6. Nonsustained wide complex tachycardia: Cardiology followup appreciated. Discussed with Dr. Myrtis Ser: Arranging for outpatient followup with EPS/Dr. Johney Frame. He indicates that evaluation of this problem will be very complex. Cardiac sarcoid needs to be ruled out. This will be difficult as we cannot do an MRI/cannot give gadolinium because of renal insufficiency.  7. Hypomagnesemia: Magnesium again low at 1.2. Will IV replete and follow up in a.m. 8. Hypokalemia: By mouth replete and follow up in a.m. 9. Acute on Chronic back pain: May be from hypercalcemia. Resolved. 10. CAD: Asymptomatic of chest pain. Continue aspirin. 11. Asthma: Stable. 12. Hyper cholesterolemia: LFTs unremarkable. Resumed statins. 13. OSA: Continue nightly CPAP. 14. GERD: PPI.  Code Status: Full Family Communication: Discussed with patient. Disposition Plan: Home when medically stable-possibly in the next 24 hours. Patient and family had expressed concerns that she had fallen at home and possibility of going to  rehabilitation. PT was consulted  again who have evaluated her and recommend outpatient PT.   Consultants:  Cardiology  Procedures:  None  Antibiotics:  None   HPI/Subjective: Denies complaints.No back pain. Stronger. NO CP/SOB. Had a large BM.  Objective: Filed Vitals:   10/21/12 0900 10/21/12 1024 10/21/12 1300 10/21/12 1415  BP: 168/79   145/82  Pulse: 59 40  64  Temp: 97.4 F (36.3 C)   97.3 F (36.3 C)  TempSrc: Oral  Oral Oral  Resp: 18   18  Height:      Weight:      SpO2: 99%   99%    Intake/Output Summary (Last 24 hours) at 10/21/12 1433 Last data filed at 10/21/12 1420  Gross per 24 hour  Intake 4097.5 ml  Output   4002 ml  Net   95.5 ml   Filed Weights   10/18/12 2124 10/19/12 2100 10/20/12 2001  Weight: 86.6 kg (190 lb 14.7 oz) 89.3 kg (196 lb 13.9 oz) 86.5 kg (190 lb 11.2 oz)    Exam:   General exam: Comfortable. Obese.   Respiratory system: Clear. No increased work of breathing.  Cardiovascular system: S1 & S2 heard, RRR. No JVD, murmurs, gallops, clicks or pedal edema. Telemetry shows sinus bradycardia in the 40's.  Gastrointestinal system: Abdomen is nondistended, soft and nontender. Normal bowel sounds heard.  Central nervous system: Alert and oriented. No focal neurological deficits.  Extremities: Symmetric 5 x 5 power.   Data Reviewed: Basic Metabolic Panel:  Recent Labs Lab 10/17/12 1110 10/17/12 1734 10/17/12 2010 10/18/12 0555 10/19/12 0450 10/19/12 2319 10/20/12 0442 10/21/12 0545  NA 134*  --   --  134* 134*  --  137 141  K 4.2  --   --  4.4 3.7  --  3.8 3.1*  CL 100  --   --  99 98  --  101 105  CO2 25  --   --  23 24  --  24 25  GLUCOSE 101*  --   --  415* 307*  --  211* 102*  BUN 49*  --   --  41* 46*  --  42* 38*  CREATININE 4.0*  --  3.24* 3.04* 2.69*  --  2.21* 1.96*  CALCIUM 14.5* 12.8*  --  12.5* 11.1*  --  10.0 9.4  MG  --   --   --   --   --  1.0* 1.7 1.2*   Liver Function Tests:  Recent Labs Lab  10/17/12 1734 10/18/12 0555 10/19/12 0450 10/20/12 0442  AST 20 17  --   --   ALT 14 17  --   --   ALKPHOS 159* 174*  --   --   BILITOT 0.3 0.3  --   --   PROT 6.7 7.3  --   --   ALBUMIN 2.9* 3.1* 2.9* 2.9*   No results found for this basename: LIPASE, AMYLASE,  in the last 168 hours No results found for this basename: AMMONIA,  in the last 168 hours CBC:  Recent Labs Lab 10/17/12 1110 10/17/12 2010 10/18/12 0555  WBC 15.3* 14.1* 10.0  NEUTROABS 10.8*  --   --   HGB 12.9 13.0 13.4  HCT 39.1 37.7 39.0  MCV 91.7 88.7 89.0  PLT 443.0* 452* 486*   Cardiac Enzymes: No results found for this basename: CKTOTAL, CKMB, CKMBINDEX, TROPONINI,  in the last 168 hours BNP (last 3 results)  Recent Labs  03/17/12 1236 03/26/12 1118 04/26/12  1002  PROBNP 253.0* 2960.0* 196.0*   CBG:  Recent Labs Lab 10/20/12 1208 10/20/12 1634 10/20/12 2124 10/21/12 0727 10/21/12 1148  GLUCAP 112* 225* 354* 85 246*    Recent Results (from the past 240 hour(s))  URINE CULTURE     Status: None   Collection Time    10/17/12 11:10 AM      Result Value Range Status   Colony Count 2,000 COLONIES/ML   Final   Organism ID, Bacteria Insignificant Growth   Final     Studies: No results found.   Additional labs:   Scheduled Meds: . amLODipine  10 mg Oral Daily  . aspirin EC  81 mg Oral Daily  . cloNIDine  0.1 mg Oral BID  . ferrous sulfate  325 mg Oral QAC breakfast  . fluticasone  2 spray Each Nare QHS  . guaiFENesin  1,200 mg Oral Daily  . heparin  5,000 Units Subcutaneous Q8H  . insulin aspart  0-5 Units Subcutaneous QHS  . insulin aspart  0-9 Units Subcutaneous TID WC  . insulin aspart  20 Units Subcutaneous TID WC  . insulin detemir  60 Units Subcutaneous q morning - 10a  . ketorolac  1 drop Both Eyes TID  . metoprolol tartrate  12.5 mg Oral Daily  . pantoprazole  40 mg Oral Daily  . polyethylene glycol  17 g Oral BID  . predniSONE  50 mg Oral Q breakfast  . senna  2  tablet Oral Daily  . sodium chloride  1,500 mL Intravenous Once  . sodium chloride  3 mL Intravenous Q12H   Continuous Infusions:    Principal Problem:   Hypercalcemia Active Problems:   DM   OBESITY   OBSTRUCTIVE SLEEP APNEA   HYPERTENSION   CAD   GERD   HYPERCHOLESTEROLEMIA   ASTHMA   Sarcoidosis   Acute on chronic renal failure   Back pain, chronic   Ventricular tachycardia, nonsustained   Bradycardia    Time spent: 45 minutes.    [facility]  [facility] Pager [phone].   If 8PM-8AM, please contact night-coverage at [email or web address], password [facility] 10/21/2012, 2:33 PM  LOS: 4 days

## 2012-10-21 NOTE — Progress Notes (Signed)
Received request from Dr. Myrtis Ser to help schedule Lexiscan MV and new pt appt with Dr. Johney Frame for 2-3 weeks in our office Left message on our office scheduling voicemail. Happy Begeman PA-C

## 2012-10-21 NOTE — Progress Notes (Signed)
Physical Therapy Treatment Patient Details Name: [name] MRN: [id number] DOB: [birth year] Today's Date: 10/21/2012 Time: 8295-6213 PT Time Calculation (min): 27 min  PT Assessment / Plan / Recommendation Comments on Treatment Session  Pt overall at independent baseline level of function. Per discussion, pt has a hx of falls that appear to be situational in nature relative to patient being in a "hurry,"  Pt reports biggest problems are when she stands up too fast (which could possibly be secondary to orthostatic event) and when she is rushing to answer the door or go to the bathroom.  Educated patient on need to sit EOB 10 secondary when sitting up prior to attempting to walk. Additionally, pt expressed concerns over days that she is unable to take her medicines properly.  I suggested some tips for this such as use of pill box or family assist to help with set up of medications.  Overall, patients current level of function appears to be at baseline. Under multiple balance assessments, patient performed well but does have some modest areas of deficits.  Recommend outpatient balance program to address deficits. Do not feel current level of function warrants any further inpatient rehabilitation at this time. Feel patient's functional mobility deficits can be addressed with outpatient balance program.  No further acute needs, PT will sign off.     Follow Up Recommendations  Outpatient PT           Equipment Recommendations  None recommended by PT          Plan All goals met and education completed, patient dischaged from PT services    Precautions / Restrictions Restrictions Weight Bearing Restrictions: No   Pertinent Vitals/Pain No pain at this time    Mobility  Bed Mobility Details for Bed Mobility Assistance: Independent per pt report Transfers Transfers: Stand to Sit;Sit to Stand Sit to Stand: 6: Modified independent (Device/Increase time);From toilet;From  chair/3-in-1 Stand to Sit: 6: Modified independent (Device/Increase time);To chair/3-in-1 Details for Transfer Assistance: No VC's needed pt; pt performed from chair x5 and from toilet at independent level Ambulation/Gait Ambulation/Gait Assistance: 6: Modified independent (Device/Increase time) Ambulation Distance (Feet): 260 Feet Assistive device: Rolling walker Ambulation/Gait Assistance Details: pt steady with gait; ambulation performed in conjunction with multiple standardized balance assessments (DGI components and Berg) Gait Pattern: Step-through pattern Gait velocity: Decreased pace in general but within functional limits  General Gait Details: No noted loss of balance; pt steady with all aspects of gait and performance of balance assessments Stairs: No Wheelchair Mobility Wheelchair Mobility: No      Visit Information  Last PT Received On: 10/21/12 Assistance Needed: +1    Subjective Data  Subjective: I've had some falls when i stand up and sometimes when I try to go to the bathroom at night Patient Stated Goal: to return home   Cognition  Cognition Overall Cognitive Status: Appears within functional limits for tasks assessed/performed Arousal/Alertness: Awake/alert Orientation Level: Appears intact for tasks assessed Behavior During Session: [facility] for tasks performed    Balance  Balance Balance Assessed: Yes Static Sitting Balance Static Sitting - Balance Support: No upper extremity supported Static Sitting - Level of Assistance: 7: Independent Static Standing Balance Static Standing - Balance Support: Bilateral upper extremity supported Static Standing - Level of Assistance: 6: Modified independent (Device/Increase time) Berg Balance Test Sit to Stand: Able to stand without using hands and stabilize independently Standing Unsupported: Able to stand safely 2 minutes Sitting with Back Unsupported but Feet Supported on Floor  or Stool: Able to sit safely and securely  2 minutes Stand to Sit: Sits safely with minimal use of hands Transfers: Able to transfer safely, minor use of hands Standing Unsupported with Eyes Closed: Able to stand 10 seconds with supervision Standing Ubsupported with Feet Together: Able to place feet together independently but unable to hold for 30 seconds From Standing, Reach Forward with Outstretched Arm: Can reach forward >12 cm safely (5") (reaching for soap from extended distance from sink ) From Standing Position, Pick up Object from Floor: Able to pick up shoe safely and easily From Standing Position, Turn to Look Behind Over each Shoulder: Looks behind from both sides and weight shifts well Turn 360 Degrees: Able to turn 360 degrees safely in 4 seconds or less Standing Unsupported, Alternately Place Feet on Step/Stool: Needs assistance to keep from falling or unable to try Standing Unsupported, One Foot in Front: Able to plae foot ahead of the other independently and hold 30 seconds Standing on One Leg: Unable to try or needs assist to prevent fall Total Score: 43 Dynamic Gait Index Level Surface: Normal Change in Gait Speed: Normal Gait with Horizontal Head Turns: Normal Gait with Vertical Head Turns: Normal Gait and Pivot Turn: Normal Step Over Obstacle: Normal Step Around Obstacles: Normal Steps: Severe Impairment (NOT ATTEMPTED THEREFORE SET TO 0) Total Score: 21  End of Session PT - End of Session Equipment Utilized During Treatment: Gait belt Activity Tolerance: Patient tolerated treatment well Patient left: in chair Nurse Communication: Mobility status   GP     Fabio Asa 10/21/2012, 10:17 AM Charlotte Crumb, PT DPT  [phone]

## 2012-10-22 LAB — BASIC METABOLIC PANEL
BUN: 38 mg/dL — ABNORMAL HIGH (ref 6–23)
Calcium: 9.8 mg/dL (ref 8.4–10.5)
Creatinine, Ser: 2 mg/dL — ABNORMAL HIGH (ref 0.50–1.10)
GFR calc Af Amer: 29 mL/min — ABNORMAL LOW (ref >90)
GFR calc non Af Amer: 25 mL/min — ABNORMAL LOW (ref >90)

## 2012-10-22 LAB — GLUCOSE, CAPILLARY
Glucose-Capillary: 135 mg/dL — ABNORMAL HIGH (ref 70–99)
Glucose-Capillary: 108 mg/dL — ABNORMAL HIGH (ref 70–99)

## 2012-10-22 MED ORDER — PREDNISONE 20 MG PO TABS: 40.0000 mg | ORAL_TABLET | Freq: Every day | ORAL | Status: DC

## 2012-10-22 MED ORDER — HYDRALAZINE HCL 25 MG PO TABS
25.0000 mg | ORAL_TABLET | Freq: Three times a day (TID) | ORAL | Status: DC
  Filled 2012-10-22 (×3): qty 1

## 2012-10-22 MED ORDER — POTASSIUM CHLORIDE ER 10 MEQ PO TBCR: 20.0000 meq | EXTENDED_RELEASE_TABLET | Freq: Every day | ORAL | Status: DC

## 2012-10-22 MED ORDER — POLYETHYLENE GLYCOL 3350 17 G PO PACK: 17.0000 g | PACK | Freq: Two times a day (BID) | ORAL | Status: DC

## 2012-10-22 MED ORDER — POTASSIUM CHLORIDE CRYS ER 20 MEQ PO TBCR
40.0000 meq | EXTENDED_RELEASE_TABLET | Freq: Once | ORAL | Status: AC
  Administered 2012-10-22: 40 meq via ORAL
  Filled 2012-10-22: qty 2

## 2012-10-22 MED ORDER — HYDRALAZINE HCL 10 MG PO TABS
10.0000 mg | ORAL_TABLET | Freq: Three times a day (TID) | ORAL | Status: DC
  Administered 2012-10-22: 10 mg via ORAL
  Filled 2012-10-22 (×3): qty 1

## 2012-10-22 MED ORDER — ALLOPURINOL 100 MG PO TABS: 100.0000 mg | ORAL_TABLET | Freq: Every day | ORAL | Status: DC

## 2012-10-22 NOTE — Progress Notes (Signed)
Patient ID: [name], female   DOB: Jun 22, 1946, 67 y.o.   MRN: [id number]   Spoke with Dr. Waymon Amato. Agree with plans for BP Rx and plan for her to go home.  Jerral Bonito, MD

## 2012-10-22 NOTE — Progress Notes (Signed)
   CARE MANAGEMENT NOTE 10/22/2012  Patient:  [name], [name]   Account Number:  [id number]  Date Initiated:  10/18/2012  Documentation initiated by:  Darlyne Russian  Subjective/Objective Assessment:   Patient admitted with hypercalcemia 14.5, ARF BUN 49, CR 4.0 with c/o increased weakness.  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   (If response is "NO", the following Medicare IM given date fields will be blank) Date Medicare IM given:   Date Additional Medicare IM given:    Discharge Disposition:  HOME W HOME HEALTH SERVICES  Per UR Regulation:    If discussed at Long Length of Stay Meetings, dates discussed:    Comments:  10/22/2012 1115 NCM discussed with pt about HH and she is requesting [facility] for [facility]. Pt states she has RW and bedside commode at home. Contacted [facility] rep and they can accept referral. Faxed HH referral to [place]. [facility] contact info added to dc instructions. Isidoro Donning RN CCM Case Mgmt phone [phone]

## 2012-10-22 NOTE — Discharge Summary (Addendum)
Physician Discharge Summary  JASHAY RODDY ZOX:[id number] DOB: [birth year] DOA: 10/17/2012  PCP: Michele Mcalpine, MD  Admit date: 10/17/2012 Discharge date: 10/22/2012  Time spent: Greater than 30 minutes  Recommendations for Outpatient Follow-up:  1. [facility]: M.D.'s office will call with appointment for stress test and followup. 2. Dr. Olean Ree, PCP in 4 days with repeat labs (BMP, albumin and magnesium) 3. Dr. Casimiro Needle [place], Endocrinologist  Discharge Diagnoses:  Principal Problem:   Hypercalcemia Active Problems:   DM   OBESITY   OBSTRUCTIVE SLEEP APNEA   HYPERTENSION   CAD   GERD   HYPERCHOLESTEROLEMIA   ASTHMA   Sarcoidosis   Acute on chronic renal failure   Back pain, chronic   Ventricular tachycardia, nonsustained   Bradycardia   Discharge Condition: Improved & Stable  Diet recommendation:  Heart healthy and diabetic  Filed Weights   10/19/12 2100 10/20/12 2001 10/21/12 2200  Weight: 89.3 kg (196 lb 13.9 oz) 86.5 kg (190 lb 11.2 oz) 90.1 kg (198 lb 10.2 oz)    History of present illness:  Patient is a 67 year old African American female with a past medical history of diabetes, hypertension, bronchial asthma, obstructive sleep apnea intermittently compliant with CPAP therapy, chronic back pain for which she gets intermittent as steroid shots by Dr. 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 She has had frequent falls over the past 2 months because of generalized weakness and difficulty ambulating. She claims that she is slightly confused and has had increasing problems with short-term memory. She claims she has not had a bowel movement for the past 1 week. She has had occasional  abdominal pain that she cannot describe well. She was seen at her endocrinologist office a few days ago and was told she was high, she was then seen by her primary care practitioner, calcium levels were over 14 creatinine was 4 and she was sent over to the emergency room for further evaluation. [facility] was subsequently asked to admit this patient for further evaluation and treatment  Hospital Course:  1. Hypercalcemia: Etiology possibly from sarcoid flare. Per H&P, only organ that had been affected was her spleen and patient is now status post splenectomy.? Other organ involvement now. Patient was initially started on aggressive IV NS, IV Lasix, Calcitonin & IV Solumedrol. Her Calcium started to improve. Changed steroids and Lasix to by mouth. ACE level increased at 80. PTH 23.8. TSH 2.77. Uncorrected calcium down to 9.8 on discharge. Resolved back pain. OP follow up with PCP/Endocrinology for follow up & further evaluation.  2. Acute on stage III chronic kidney disease: suspect related to hypercalcemia. Clinically appears euvolemic. Renal functions continued to improve with above treatment. DC creatinine was 2. Baseline creatinine 1.4-1.7. Follow BMP in 4 days from DC  3. Uncontrolled DM 2: Precipitated by steroids. Patient claims compliance to high dose Levemir and Apidra mealtime and sliding scale insulin. Hemoglobin A1c 08/11/11 was 9.3. Continue home dose insulins on DC, but will need adjustment for better control. 4. Hypertension, mildly uncontrolled: Continue clonidine at home dose 0.2 mg bid, Amlodipine, Metoprolol and Hydralazine. 5. Sinus bradycardia in the 40s: Asymptomatic. Consider gradually tapering clonidine to discontinue as outpatient. Hydralazine which was held will be resumed and should help. Cardiology followup appreciated.  6. Nonsustained wide complex tachycardia: Cardiology consulted- Arranging for outpatient followup with [facility]/Dr. Allred. Cardiology indicates that evaluation of this problem  will be very complex. Cardiac sarcoid needs to be ruled out. This will be difficult as we cannot do an MRI/cannot give gadolinium because of renal insufficiency.  7. Hypomagnesemia: Magnesium was repleted and needs OP follow up. 8. Hypokalemia: By mouth replete and follow up OP 9. Acute on Chronic back pain: May be from hypercalcemia. Resolved. 10. CAD: Asymptomatic of chest pain. Continue aspirin. 11. Asthma: Stable. 12. Hyper cholesterolemia: LFTs unremarkable. Resumed statins. 13. OSA: Continue nightly CPAP. 14. GERD: PPI.   Procedures:   None   Consultations:   [place] [place] [facility]  Discharge Exam:  Complaints:  Patient denies complaints.  Filed Vitals:   10/21/12 1415 10/21/12 1805 10/21/12 2200 10/22/12 0419  BP: 145/82 136/61 181/62 162/87  Pulse: 64 71 62 59  Temp: 97.3 F (36.3 C) 97.9 F (36.6 C) 97.5 F (36.4 C) 97.5 F (36.4 C)  TempSrc: Oral Oral Oral Oral  Resp: 18 18 16 18   Height:      Weight:   90.1 kg (198 lb 10.2 oz)   SpO2: 99% 98% 99% 100%    General exam: Comfortable. Obese.  Respiratory system: Clear. No increased work of breathing.  Cardiovascular system: S1 & S2 heard, RRR. No JVD, murmurs, gallops, clicks or pedal edema. Telemetry: Improving heart rate. Although heart rate still mostly in the mid to high 40s, she's been having heart rates in the 50s-70s.  Gastrointestinal system: Abdomen is nondistended, soft and nontender. Normal bowel sounds heard.  Central nervous system: Alert and oriented. No focal neurological deficits.  Extremities: Symmetric 5 x 5 power.  Discharge Instructions      Discharge Orders   Future Appointments Provider Department Dept Phone   11/08/2012 3:00 PM Michele Mcalpine, MD [facility] [phone]   Future Orders Complete By Expires     Call MD for:  difficulty breathing, headache or visual disturbances  As directed     Call MD for:  extreme fatigue  As directed     Call MD for:  persistant dizziness or  light-headedness  As directed     Call MD for:  severe uncontrolled pain  As directed     Diet - low sodium heart healthy  As directed     Diet Carb Modified  As directed     Increase activity slowly  As directed         Medication List    STOP taking these medications       glimepiride 4 MG tablet  Commonly known as:  AMARYL     sitaGLIPtin 50 MG tablet  Commonly known as:  JANUVIA      TAKE these medications       albuterol 108 (90 BASE) MCG/ACT inhaler  Commonly known as:  PROVENTIL HFA;VENTOLIN HFA  Inhale 2 puffs into the lungs every 6 (six) hours as needed. For shortness of breath.     albuterol (2.5 MG/3ML) 0.083% nebulizer solution  Commonly known as:  PROVENTIL  Take 2.5 mg by nebulization 4 (four) times daily. Shortness of breath     allopurinol 100 MG tablet  Commonly known as:  ZYLOPRIM  Take 1 tablet (100 mg total) by mouth daily.     amLODipine 10 MG tablet  Commonly known as:  NORVASC  Take 10 mg by mouth daily.     APIDRA SOLOSTAR 100 UNIT/ML injection  Generic  drug:  insulin glulisine  Inject 20-34 Units into the skin 3 (three) times daily before meals. Use 20 units before breakfast and 34 units before lunch and 30 units before dinner     aspirin EC 81 MG tablet  Take 81 mg by mouth daily.     atorvastatin 20 MG tablet  Commonly known as:  LIPITOR  Take 20 mg by mouth daily.     cloNIDine 0.1 MG tablet  Commonly known as:  CATAPRES  Take 0.1 mg by mouth 2 (two) times daily.     esomeprazole 40 MG capsule  Commonly known as:  NEXIUM  Take 40 mg by mouth 2 (two) times daily.     ferrous sulfate 325 (65 FE) MG tablet  Take 325 mg by mouth daily with breakfast.     fluticasone 50 MCG/ACT nasal spray  Commonly known as:  FLONASE  Place 2 sprays into the nose at bedtime.     Fluticasone-Salmeterol 250-50 MCG/DOSE Aepb  Commonly known as:  ADVAIR  Inhale 1 puff into the lungs 2 (two) times daily.     glucose blood test strip  1 each by  Other route as needed for other. Use as instructed     guaiFENesin 600 MG 12 hr tablet  Commonly known as:  MUCINEX  Take 1,200 mg by mouth daily.     hydrALAZINE 10 MG tablet  Commonly known as:  APRESOLINE  Take 10 mg by mouth 3 (three) times daily.     insulin detemir 100 UNIT/ML injection  Commonly known as:  LEVEMIR  Inject 80 Units into the skin every morning.     ketorolac 0.4 % Soln  Commonly known as:  ACULAR LS  Place 1 drop into both eyes 3 (three) times daily.     Lancets Misc  1 Units by Does not apply route 4 (four) times daily - after meals and at bedtime.     LASIX 20 MG tablet  Generic drug:  furosemide  Take 20 mg by mouth daily.     LORazepam 1 MG tablet  Commonly known as:  ATIVAN  Take 1 mg by mouth daily as needed. For anxiety.     metoprolol tartrate 25 MG tablet  Commonly known as:  LOPRESSOR  Take 12.5 mg by mouth daily.     polyethylene glycol packet  Commonly known as:  MIRALAX / GLYCOLAX  Take 17 g by mouth 2 (two) times daily.     potassium chloride 10 MEQ tablet  Commonly known as:  K-DUR  Take 2 tablets (20 mEq total) by mouth daily.     predniSONE 20 MG tablet  Commonly known as:  DELTASONE  Take 2 tablets (40 mg total) by mouth daily.     pregabalin 100 MG capsule  Commonly known as:  LYRICA  Take 100 mg by mouth every evening.     promethazine 25 MG tablet  Commonly known as:  PHENERGAN  Take 1 tablet (25 mg total) by mouth every 6 (six) hours as needed for nausea.     sennosides-docusate sodium 8.6-50 MG tablet  Commonly known as:  SENOKOT-S  Take 1 tablet by mouth at bedtime.     TUSSIN DM 10-100 MG/5ML liquid  Generic drug:  Dextromethorphan-Guaifenesin  Take 5 mLs by mouth daily as needed (for cough).       Follow-up Information   Follow up with [facility]. (Our office will call you with your stress test date & followup appointment. Call  office if you haven't heard from [place] within 3 days.)    Contact  information:   [address] [place] [address] [phone]      Follow up with NADEL,SCOTT M, MD. Schedule an appointment as soon as possible for a visit in 4 days. (To be seen with repeat labs (BMP, albumin & Mg))    Contact information:   [address] [place] [address] [phone]       Schedule an appointment as soon as possible for a visit with Junious Silk, MD.   Contact information:   [address] [place] [address] [phone]        The results of significant diagnostics from this hospitalization (including imaging, microbiology, ancillary and laboratory) are listed below for reference.    Significant Diagnostic Studies: Dg Chest 2 View  10/17/2012  *RADIOLOGY REPORT*  Clinical Data: History of sarcoidosis, hypertension, kidney failure, asthma, bronchitis, evaluate for pulmonary edema  CHEST - 2 VIEW  Comparison: 06/14/2012; 10/21/2010; CT abdomen pelvis - 07/21/2012  Findings:  Grossly unchanged enlarged cardiac silhouette and mediastinal contours.  There is chronic mild elevation of the right hemidiaphragm.  No focal airspace opacities.  No pleural effusion or pneumothorax.  No definite evidence of pulmonary edema.  Post cholecystectomy.  Surgical clips overlie the expected location of the gastric fundus.  Unchanged bones.  IMPRESSION: No acute cardiopulmonary disease.   Original Report Authenticated By: Tacey Ruiz, MD     Microbiology: Recent Results (from the past 240 hour(s))  URINE CULTURE     Status: None   Collection Time    10/17/12 11:10 AM      Result Value Range Status   Colony Count 2,000 COLONIES/ML   Final   Organism ID, Bacteria Insignificant Growth   Final     Labs: Basic Metabolic Panel:  Recent Labs Lab 10/18/12 0555 10/19/12 0450 10/19/12 2319 10/20/12 0442 10/21/12 0545 10/22/12 0450  NA 134* 134*  --  137 141 140  K 4.4 3.7  --  3.8 3.1* 3.2*  CL 99 98  --  101 105 106  CO2 23 24  --   24 25 23   GLUCOSE 415* 307*  --  211* 102* 79  BUN 41* 46*  --  42* 38* 38*  CREATININE 3.04* 2.69*  --  2.21* 1.96* 2.00*  CALCIUM 12.5* 11.1*  --  10.0 9.4 9.8  MG  --   --  1.0* 1.7 1.2* 1.9   Liver Function Tests:  Recent Labs Lab 10/17/12 1734 10/18/12 0555 10/19/12 0450 10/20/12 0442  AST 20 17  --   --   ALT 14 17  --   --   ALKPHOS 159* 174*  --   --   BILITOT 0.3 0.3  --   --   PROT 6.7 7.3  --   --   ALBUMIN 2.9* 3.1* 2.9* 2.9*   No results found for this basename: LIPASE, AMYLASE,  in the last 168 hours No results found for this basename: AMMONIA,  in the last 168 hours CBC:  Recent Labs Lab 10/17/12 1110 10/17/12 2010 10/18/12 0555  WBC 15.3* 14.1* 10.0  NEUTROABS 10.8*  --   --   HGB 12.9 13.0 13.4  HCT 39.1 37.7 39.0  MCV 91.7 88.7 89.0  PLT 443.0* 452* 486*   Cardiac Enzymes: No results found for this basename: CKTOTAL, CKMB, CKMBINDEX, TROPONINI,  in the last 168 hours BNP: BNP (last 3  results)  Recent Labs  03/17/12 1236 03/26/12 1118 04/26/12 1002  PROBNP 253.0* 2960.0* 196.0*   CBG:  Recent Labs Lab 10/21/12 0727 10/21/12 1148 10/21/12 1659 10/21/12 2131 10/22/12 0833  GLUCAP 85 246* 271* 252* 135*    Additional labs:   PTH related peptide: Pending  TSH: 2.77  Vitamin D 1, 25 total: 92  Vitamin D., 25-hydroxy: 27  2 D Echo Study Conclusions  - Left ventricle: The cavity size was normal. Wall thickness was normal. Systolic function was normal. The estimated ejection fraction was in the range of 50% to 55%. Regional wall motion abnormalities cannot be excluded. - Mitral valve: Mild regurgitation. - Pulmonary arteries: Systolic pressure was mildly increased.  Impressions:  - Extremely limited due to poor sound wave transmission; LV function appears to be preserved; focal wall motion abnormality cannot be excluded; suggest MUGA or cardiac MR if clinically indicated.   Discussed at length with patients brother Mr.  Bonne Dolores at patients request, prior to DC. Updated care and answered questions. He was concerned that she was weak and had falls. PT evaluated twice and recommended OP PT. HH arranged by CM.   Signed:  Curly Mackowski  [facility] 10/22/2012, 11:00 AM

## 2012-10-25 ENCOUNTER — Telehealth: Payer: Self-pay | Admitting: Pulmonary Disease

## 2012-10-25 ENCOUNTER — Encounter: Payer: Self-pay | Admitting: Pulmonary Disease

## 2012-10-25 ENCOUNTER — Ambulatory Visit (INDEPENDENT_AMBULATORY_CARE_PROVIDER_SITE_OTHER): Payer: BC Managed Care – PPO | Admitting: Pulmonary Disease

## 2012-10-25 VITALS — BP 160/72 | HR 58 | Temp 97.1°F | Ht 62.5 in | Wt 202.6 lb

## 2012-10-25 DIAGNOSIS — I10 Essential (primary) hypertension: Secondary | ICD-10-CM

## 2012-10-25 DIAGNOSIS — I472 Ventricular tachycardia: Secondary | ICD-10-CM

## 2012-10-25 DIAGNOSIS — M1990 Unspecified osteoarthritis, unspecified site: Secondary | ICD-10-CM

## 2012-10-25 DIAGNOSIS — D649 Anemia, unspecified: Secondary | ICD-10-CM

## 2012-10-25 DIAGNOSIS — G8929 Other chronic pain: Secondary | ICD-10-CM

## 2012-10-25 DIAGNOSIS — N189 Chronic kidney disease, unspecified: Secondary | ICD-10-CM

## 2012-10-25 DIAGNOSIS — D472 Monoclonal gammopathy: Secondary | ICD-10-CM

## 2012-10-25 DIAGNOSIS — M549 Dorsalgia, unspecified: Secondary | ICD-10-CM

## 2012-10-25 DIAGNOSIS — N179 Acute kidney failure, unspecified: Secondary | ICD-10-CM

## 2012-10-25 DIAGNOSIS — E119 Type 2 diabetes mellitus without complications: Secondary | ICD-10-CM

## 2012-10-25 DIAGNOSIS — E7800 Pure hypercholesterolemia, unspecified: Secondary | ICD-10-CM

## 2012-10-25 DIAGNOSIS — E669 Obesity, unspecified: Secondary | ICD-10-CM

## 2012-10-25 DIAGNOSIS — I4729 Other ventricular tachycardia: Secondary | ICD-10-CM

## 2012-10-25 DIAGNOSIS — D869 Sarcoidosis, unspecified: Secondary | ICD-10-CM

## 2012-10-25 DIAGNOSIS — I5032 Chronic diastolic (congestive) heart failure: Secondary | ICD-10-CM

## 2012-10-25 DIAGNOSIS — I2510 Atherosclerotic heart disease of native coronary artery without angina pectoris: Secondary | ICD-10-CM

## 2012-10-25 NOTE — Telephone Encounter (Signed)
I spoke with Tammy. She stated they are wanting to do OT/PT on pt to help build stamina since being released. I advised that would be fine. Nothing further was needed

## 2012-10-25 NOTE — Progress Notes (Signed)
Subjective:    Patient ID: [name], female    DOB: 04/20/1946, 67 y.o.   MRN: [id number]  HPI 67 y/o BF here for a follow up visit... he has multiple medical problems as noted below...    ~  June 28, 2011:  58mo ROV> on Pred 20-10 Qod for the last month and BMet today shows Ca++=9.8;  She is anxious to wean the Pred as quickly as poss due to incr appetite, weight gain, difficulty controlling her DM, etc;  She has been seeing DrAltheimer every 3-4wks w/ adjustments in her insulin> currently on Levemir 46u qd; Apidra 10+SS Tid at meals, Amaryl & Januvia;  BS's vary tremendously & she is struggling w/ diet (weight up 8# in the last month to 188# today);  We decided to cut the Pred to 10mg /d now and then further to 10-5 qod after 3wks w/ ROV in 6 weeks...  ~  August 16, 2011:  6wk ROV & post hosp check> she was [facility] 1[facility]8 - 08/12/11 w/ dyspnea (DOE, orthopnea, edema) thought to be due to diastolic heart failure (2DEcho w/ Gr2DD, BNP=2200), she was diuresed & improved, disch on LASIX40mg - 1/2 tab each am & her prev Diovan was stopped (due to renal insuffic w/ Creat=2.4)...  Her serum Calcium levels were all norm 9.6-9.8;  A1c=9.3;  Hg=10.4>> See below...    Feeling better since disch;  Weight 196# is still 8# heavier than her last visit w/ me 11/12;  We decided to decr her Pred from 10-5 Qod to 5mg /d & keep Lasix at 20mg /d... LABS 1/13:  BUN=33, Creat=1.7, BNP=100;  Ca=9.7;  Hg=12.9;  BS=69 & she will f/u w/ DrAltheimer...  ~  October 06, 2011:  436mo ROV on Pred 5mg /d & generally stable but w/ mult minor somatic complaints- sl SOB, some cough, thick white mucous, right arm discomfort, & reflux symptoms;  She had a ZPak called in about 2wks ago;  She is on a good antireflux regimen, plus Flonase & Saline;  We will add Tussionex for prn use;  Unfortunately weight is up 9# further tp 205# today;  BP remains controlled on 5 MEDS (see below);  She continues to f/u w/ DrAltheimer for DM> on Levimir  46u & Apidra 03-15-13;  We decided to cut the Pred to 5mg  QOD & keep the Lasix 20mg /d... LABS 3/13:  BS=135,  BUN=38, Creat=1.6;  Ca=10.1;  ACE=48;  BNP=128...  ~  January 06, 2012:  36mo ROV & [name] has been on Pred5mg Qod for the last 36mo; she has noted some joint pains & has rash on her arms- seeing derm w/ hx Psoriasis but she feels their topical rx isn't working; she also notes sl cough, some greenish sput production, chest feels tight, & some right sided chest discomfort- she is requesting a ZPak which seems to work for her...     She saw DrAlt 4/13> Ave BS from her machine 249 & A1c=9.1, insulin adjusted; FLP looked good on Lip20; extensive note reviewed...    [name], meds, xrays and labs> see below>> LABS 6/13:  Chems- BS=169 BUN=41 Creat=2.3 BNP=104 Ca=11.0 ACE=86;  CBC- ok w/ Hg=12.4 REC> Stop Lasix20 for now, increase Pred to 10mg /d, ROV 6wks w/ BMet...  ~  March 30, 2012:  36mo ROV & post-ER visit> she's been to the ER twice in the last 436mo w/ SOB, cough, congestion, etc; on Pred, Advair, Mucinex, Tussionex, & given antibiotics, Neb Rx, etc & improved;  Also rec to  restart     She saw DrWall for Cards f/u 7/13> known CAD, DOE, good BP control & 2DEcho showed good LVF & no evid for pulmHTN; continue same meds, & Lasix...    She had f/u DrAltheimer 6/13> DM, renal insuffic, hyperlipidemia, hypercalcemia, etc; on Levemir80, Apidra20-34-30, ZOXWRUE45 & Amaryl4; BS=118 A1c=8.8.Marland KitchenMarland Kitchen [name], meds, xrays and labs> see below for updates >> CXR 8/13 showed cardiomeg, ?mild interstitial edema, mild DJD TSpine... EKG 8/13 showed NSR, rate93, min NSSTTWA, NAD... LABS 8/13:  CBC- Hg=11.9 WBC=18K;  Chems- BUN=30 Creat=1.6 Ca=10.0 BNP=2960 ==> started back on Lasix40/d...  ~  April 28, 2012:  83mo ROV & last visit we decreased the Pred to 5mg /d (on this for hypercalcemia from Sarcoid) & decreased the Lasix to 20mg /d (due to RI w/ Cr=2.2);  Follow up labs today showed Ca=9.1  and we decided to decr the Pred further to 5mg  Qod;  In addition her BUN=40, Creat=2.1, BNP=196 (ok to continue Lasix20); she understands that if her Creat starts to rise further that we will have to send her to Nephrology...    She saw DrAltheimer for f/u 9/13> note reviewed & he adjusted her insulin regimen- Levemir 80uQam & Apidra 20-34-30 + SS adjustments...    She fell 8/30 & hit her head> went to ER & DrLockwood's note is reviewed- c/o headache & left knee pain; Exam showed forehead hematoma & CTBrain showed left frontal scalp hematoma, no fx, mild cortical atrophy, mild carotid atherosclerosis, NAD;  Maxillofacial CT was neg w/o facial bone fx;  Left knee prosthesis & no acute changes...    She reports that she just had an ESI injection from DrRamos for LBP & she is improved...    [name], meds, xrays and labs> see below for updates >> LABS 9/13:  Chems- BS=234, Ca=9.1, BUN=40, Creat=2.1, BNP=196   ~  June 21, 2012:  1mo ROV & [name] went to the ER 06/14/12 w/ c/o SOB> she had developed a sore throat, wheezing, cough, & SOB; she had taken ZPak, Delsym, Proair which helped somewhat;  CXR showed cardiomeg, pulm vasc congestion, DJD in TSpine, NAD;  ER treated w/ Pred boost to 40mg /d, & NEB Rx which really helped;  She notes stuff head & some drainage, denies GI/ reflux symptoms;  She is obese, cushingoid, and we were in the process of weaning off the Pred when it was boosted up by ER- we gave her a NEB Rx & this really helped;  Placed on O2 by nasal cannula for hypoxemia- 84% on RA;  Decision made to wean Pred down to 5mg Qod as before, continue Advair250Bid, add NEBS Qid w/ Albut vs Proair, plus her Mucinex/ Fluids/ etc...    She continues her regular f/u appts w/ DrAltheimer, Endocrine> on Levemir, Apidra, Amaryl, Januvia; last A1c=8.1 is Sep2013...    [name], meds, xrays and labs> see below for updates >>   ~  October 25, 2012:  85mo ROV & post hospital visit> she  was [facility] 3/18 - 10/22/12> presented w/ weakness, difficulty ambulating, sl confusion w/ memory loss; she had been to DrAltheimer's office & labs showed Ca>14 w/ Creat>4;  Hypercalcemia related to Sarcoidosis- treated w/ IVF, Lasix, Calcitonin, & Solumedrol- ACE level=80;  She was disch on Pred20Bid but we will need to wean this quickly due to her DM;  She had acute renal failure as well w/ Creat>4 and after hydration she was disch w/ Creat ~2;  DM followed &  treated by DrAltheimer;  DCSummary reviewed in detail>>     Sarcoidosis w/ HYPERCALCEMIA> Hypercalcemia returned when Pred was cut to 5mg  Qod; now back on Pred20Bid post hosp & Ca=10.6; we discussed wean to 20mg /d w/ ROV & recheck Ca level in 3 weeks...     HBP> on Metop25- 1/2, Norvasc10, Apres10Tid, Clonidine0.1Bid, Lasix20, K10-2/d; BP= 160/72 & she denies CP, palpit, dizzy, ch in SOB/ DOE, edema, etc...    Cardiac Arrhythmia> she has WCT & SBrady- eval by DrAllred for Cards & meds adjusted...     CHOL> on Lip20; last FLP was 4/12- looked good & we reviewed low fat diet restrictions...    DM on Insulin> followed by DrA on Levemir30 & ApidraSS coverage at meals; BS at home varies 150-400+, labs done Q47mo by DrAltheimer...    Obesity> wt= 203# (63"Tall & BMI=37) and we reviewed diet, exercise, & wt reduction strategies...    GI> GERD on NexiumBid; and we reviewed antireflux regimen (elev HOB, npo after dinner, etc)...    Renal Insuffic> Creat has returned to 2.08 now; advised incr free water intake...    Anxiety> on Alprazolam prn; she's been under extra stress w/ loss of job, this illness, family issues...  [name], meds, xrays and labs> see below for updates >>          Problem List:    OBSTRUCTIVE SLEEP APNEA - Sleep Study 11/07 showed RDI=21 w/ desat to 62% during REM.Marland Kitchen. eval by DrSood w/ Rx for CPAP 12... compliance poor- only using it 1-2 times/wk... encouraged to use CPAP more regularly & to f/u w/ DrSood regarding the  mask interface problems... ~  8/11:  she reports new mask but still not using CPAP regularly, "I rest fairly well". ~  7/12:  She reports not using her CPAP, and not resting well... ~  1/13:  CPAP used briefly during [facility] for diastolic CHF, but still not compliant w/ home use...  BRONCHITIS, ACUTE - on ADVAIR 250Bid (using Prn only) & PROAIR as needed. 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 ~  CXR 1/09 was pre-op for TKR- sl cardomeg & ?mild vasc congestion. ~  CXR 2/11 showed norm heart size & vascularity, clear, s/p cholecystectomy, DJD in TSpine. ~  CXR & CT Chest 11/11 showed sl peribronch thickening, no lung lesions, no signif adenopathy. ~  PET scan 1/12 showed hypermetabolic activ in spleen & lymph nodes in the supraclav, hilar, mediastinal, periaotic & iliac region as well. ~  CXRs 3/12 in the perioperative period after splenectomy showed left effusion==> tapped & improved aeration. ~  CXR 4/12 back to baseline w/ clear bases, essent wnl... ~  CXR 7/12 showed norm heart size, clear lungs, no definite adenopathy, NAD... ~  LABS 7/12 w/ ACE=78 (8-52), Ca=12.0, AlkPhos=176 (39-117), GGT=151 (7-51) ~  Labs from DrAltheimer> 8/12: Ca=12.4, Creat=1.8; then 9/12: Ca=13.4, Creat=2.3;  ~  10/12:  PREDNISONE 20mg /d started 10/12 & 3wks later Ca=10.3, Creat=1.8; therefore weaned to 20-10 Qod. ~  11/12: Labs on Pred 20-10 Qod showed Ca++= 9.8; we will continue to wean Pred. ~  1/13:  CXR showed cardiomeg, mild interstitial edema, DJD spine; Labs showed Ca=9.7 & we decided to wean Pred to 5mg /d... ~  3/13:  Labs showed Ca=10.1 on Pred 5mg /d; decided to wean to 5mg Qod... ~  6/13:  Labs showed Ca=11.0, ACE=86; on Pred5Qod & rec to incr back to 10mg /d... ~  8/13:  CXR showed cardiomeg, ?mild interstitial edema,  mild DJD TSpine... ~  Labs 8/13 showed Ca=10.0 on Pred10/d; rec to decr further to 5mg /d... ~  Labs 9/13 showed Ca= 9.1 on Pred5mg /d & she  will try to decr to 5Qod again... ~  11/13: she recently went to ER w/ incr SOB & was placed on Pred40/d; we are adding NEBS w/ ALBUT2.5mg Qid & wean Pred back to 5mg Qod as before. ~  3/14: she was hosp w/ Ca>14 (on the Pred5mg Qod); treated w/ IVF, Lasix, Calcitonin & incr steroids; Disch on Pred20Bid; we will wean Pred to 20mg /d & f/u in 3wk.  HYPERTENSION - taking METOPROLOL 25mg - 1/2 daily, NORVASC 10mg /d, APRESOLINE 10mg Tid, CLONIDINE 0.1mg Bid, LASIX 20mg - see below; off prev [id number] due to azotemia w/ diuresis. ~  4/12:  BP here 132/62 and she denies HA, visual changes, CP, palipit, dizziness, syncope, change in dyspnea, etc... ~  10/12:  BP= 132/80 & she denies CP, palpit, dizzy, ch in SOB/ DOE, edema, etc... ~  11/12:  BP= 132/82 & she is relatively asymptomatic other than DOE from her weight and some musc cramps... ~  1/13: Post Hosp BP= 146/70 on ? - Metop12.5, Amlod10, Clonid0.1Bid, Lasix20; she denies CP, palpit, SOB, edema> improved from recent hosp... ~  3/13: She called in the interim for "refill" Apresoline not prev on any med list; BP= 150/80 on all 5 meds; must elim sodium & get wt down... ~  6/13:  BP= 142/80 on but BUN-41 Creat=2.3 BNP=104; rec to stop Lasix for now, f/u BMet 6wks. ~  8/13:  BP= 140/70 on same meds & BUN=30, Creat=1.6, but BNP incr to 2960 & LASIX 40mg /d restarted... ~  9/13:  Follow up labs showed BUN=45, Creat=2.2 on Lasix40 and we rec decr to Lasix20... ~  9/13:  BP= 122/74 on w/ BUN=40, Creat=2.1, & BNP=196... Continue same. ~  11/13:  BP= 142/68 on same ... ~  3/14:  BP= 160/72 on same 5 meds for now...  CAD & CARDIAC ARRHYTHMIA >> on ASA 81mg /d...  ~  Hx non-obstructive CAD w/ cath 6/07 by DrStuckey showing luminal irregularities & tiny first marginal branch of the CIRC w/ ostial lesion... good LVF. ~  2DEcho 9/07 showed mildly calcif AoV and normal LVF & wall motion. ~  2DEcho 11/11 showed norm LV wall thickness, norm LVF w/ EF=  60-65%, norm atria, norm valves, trivial peric fluid behind heart. ~  2DEcho 1/13 showed mild LVH, norm LVF w/ EF=55-60% & no regional wall motion abn, Gr2DD, norm RV... ~  3/14: In hosp for hypercalcemia etc> she had cardiac arrhythmia w/ WCT & SBrady- to be f/u DrAllred...  CEREBROVASCULAR DISEASE - she remains on ASA 81mg /d without TIA's or other neuro manifestations...  ~  MRA Br 9/07 showed mod intracranial atherosclerotic changes...   HYPERLIPIDEMIA - on LIPITOR 20mg /d... FLP monitored at each OV by DrAltheimer... ~  FLP 7/07 showed TChol 114, TG 85, HDL 28, LDL 69 ~  FLP 4/09 showed TChol 154, TG 192, HDL 34, LDL 82... discussed poss of adding fibrate- hold... ~  FLP 2/10 on Lip20 showed TChol 159, TG 207, HDL 29, LDL 83... rec> add Fenoglide (she didn't). ~  FLP 2/11 on Lip20 showed TChol 185, TG 242, HDL 43, LDL 99... add FENOFIBRATE 160mg /d... ~  FLP 8/11 on Lip20+Feno160 showed TChol 167, TG 216, HDL 37, LDL 96... she stopped Feno160. ~  FLP 4/12 on Lip20 showed TChol 146, TG 115, HDL 35, LDL 88 ~  FLP 2/13 on Lip20 showed TChol 159, TG  176, HDL 41, LDL 86  DM - on LEVEMIR & APIDRA SS coverage now per DrAltheimer; plus GLIMEPIRIDE 4mg /d and JANUVIA 50mg /d. ~  labs 4/08 showed BS=138 & HgA1c=7.5.Marland Kitchen. home BS = 100 to >300... misses doses and not on diet or exercising... ~  1/09 became hypoglycemic in hosp on 4 meds post knee surg- meds adjusted... ~  3/09 restarted GLIMEPIRIDE 4mg - 1/2 tab each AM, & continue Lantus & Metformin... ~  labs 4/09 showed BS= 148, HgA1c= 7.3.Marland Kitchen. rec> back on 4 med regimen due to poor control at home. ~  6/09 discussed titrating the Lantus up until FBS 100-120 range... ~  labs 2/10 showed BS= 164, HgA1c= 8.6.Marland KitchenMarland Kitchen very disappointing- needs home BS monitoring, incr Lantus. ~  labs 2/11 showed BS= 298, A1c= 8.4...  discussed change Lantus to LEVEMIR 40 u daily... ~  labs 8/11 showed BS= 202, A1c= 10.9.Marland Kitchen. rec> incr Levemir to 50, consider Humalog. ~  Nov-Dec  2011:  BS up w/ adjust of meds & addition of Pred after hosp 11/11 & hypercalcemia... ~  She states she returned to Levemir 40u daily after the splenectomy hosp due to appetite etc;  4/12 BS=117 ~  Labs 7/12 showed BS= 167, A1c= 8.4> on Levemir 50u/d & Amaryl 4mg  Qam (note creat 1.9). ~  8-10/12: on Levemir30 + ApidraSS per DrA & BS are 150-400+ due to the Pred20; A1c 8/12 was 7.6 ~  11/12:  DrA has increased her Levemir to 46u/d & the Apidra to 10+SS cover Tid... BS here= 168 ~  1/13: Covered w/ SSI in Hosp but A1c=9.3 (Creat up to 2.4 but improved to 1.7 w/ adjust meds)... ~  DrAltheimer's notes indicates continued adjustments in her insulin regimen w/ Levemir & Apidra...  OBESITY (ICD-278.00) - weight down to 171 after hosp 11/11- doing better on diet, not yet exercising. ~  weight 220-230# in the early 1990's... ~  weight 210-220# in the early 2000's... ~  weight 2/10 = 192# ~  weight 2/11 = 186# ~  weight 8/11 = 174# ~  weight 12/11 = 171# ~  Weight 4/12 = 168# ~  Weight 7/12 = 171# ~  Weight 10/12 = 180# ~  Weight 11/12 = 188# ~  Weight 1/13 = 196# ~  Weight 3/13 = 205#... She MUST get back on diet, incr exercise, get wt down. ~  Weight 6/13 = 205# ~  Weight 8/13 = 210# ~  Weight 9/13 = 220#... What happened? ~  Weight 11/13 = 225# and BMI= 40 ~  Weight 3/14 = 203#  HYPERCALCEMIA> this was most likely due to sarcoidosis w/ extensive splenic involvement;  Calcium was as hight as 13.7 and returned to normal after the splenectomy. ~  Labs 4/12 showed calcium = 11.1 & this is very disappointing... ~  Labs 7/12 showed Ca= 12.0, Phos= 4.0, ACE= 78 ~  Labs 8-10/12 showed Ca up to 13.4, then down to 10.3 on PRED20mg /d... ~  Labs 11/12 showed Ca= 9.8 on Pred 20-10 qod; we will continue to wean. ~  Labs 1/13 in [place] showed Ca= 9.7 range on Pred 10-5 Qod;  We decided to wean Pred to 5mg /d... ~  Labs 3/13 showed Ca=10.1 on Pred5mg /d; rec to decr to 5mg Qod... ~  Labs 6/13 showed Ca=11.0 on  Pred5Qod; rec incr to 10mg /d... ~  Labs 8/13 showed Ca=10.0 on Pred10/d; rec to decr to 5mg /d... ~  Labs 9/13 showed Ca=9.1 on Pred5mg /d; rec to decr to 5mg Qod... ~  Labs 12/13 showed Ca=  9.2 to 10.5 on Pred 5mg Qod... ~  Labs 3/14 in hosp showed Ca= 14.5  GERD - on NEXIUM 40mg  Bid... w/ increased reflux symptoms w/ noct cough etc...  ~  2/10: discussed optimal Rx w/ Nex before dinner, Zantac300 + Reglan10 at bed, elev HOB, etc. ~  2/11: she stopped the Reglan, still using Nexium/ Zantac but w/ persist symptoms>  refer to GI for eval. ~  6/11: GI f/u DrPerry w/ EGD that was normal... continue Rx. ~  12/11:  increased GI symptoms post hosp> she will f/u w/ DrPerry for his input> incr Nexium Bid.  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 ~  6/11:  f/u colonoscopy by DrPerry showed divertics, 4 polyps, AVM.Marland KitchenMarland Kitchen path= tubular adenoma, f/u 59yrs.  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 ~  Note: Serum Calcium ret to normal immediately after spleen removed...  RENAL INSUFFICIENCY (ICD-588.9) >> Creat ~ 2.0 & eval 10/11 by DrFox- prob due to DM & HBP... ~  Labs 4/12 showed BUN= 27, Creat= 1.6 ~  Labs 7/12 showed BUN= 44, Creat= 1.9 (not on diuretics/ NSAIDs/ etc)... ~  Labs showed Creat up to 2.3 when Ca=13.4; now improved to 1.8 w/ Ca coming down... ~  Labs 11/12 showed BUN=38, Creat=1.7; rec to maintain hydration... ~  Labs 1/13 in [place] w/ diuresis showed Creat incr to 2.4 but ret to 1.7 w/ adjustment in meds... ~  Labs 3/13 showed BUN=38, Creat= 1.6 ~  Labs 6/13 showed BUN=41, Creat= 2.3; rec> stop Lasix20 for now, liberalize fluids. ~  Labs 9/13 showed BUN=40, Creat=2.1 on Lasix20, rec to continue the same. ~  Labs 12/13 showed Creat= 1.5 ~   Labs 3/14 in hosp showed Creat=4, then decr to 2.00 by dischage...  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 on LYRICA 100mg  Qhs, off prev Tramadol Rx... ~  2010 eval by DrHiatt @ [facility] on Tramadol 50mg , Lyrica 75mg Bid...  GOUT, UNSPECIFIED (ICD-274.9) - Uric in the 7-8 range...on ALLOPURINOL 300mg /d for prevention...  ANXIETY (ICD-300.00) - she is under mod stress- work, mother died, hospice counselling, etc... ~  8/11:  rec starting Alprazolam 0.5mg  Tid for palpit, SOB, anxiety...  ANEMIA-NOS & MGUS >> see eval 10-11/11 by DrGranfortuna w/ bone marrow 11/11 in hosp= neg. ~  Labs 4/12 showed Hg= 11.6, MCV= 84... On FeSO4 daily. ~  Labs 7/12 showed Hg= 12.8, MCV= 90, Fe= 45 (14%sat)... ~  Labs 1/13 in [place] showed Hg=10.4 but improved to 12.9 on post hosp check in office... ~  Labs 6/13 showed Hg= 12.4 ~  Labs 12/13 showed Hg= 13.9 ~  Labs 3/14 showed Hg= 12.9  PSORIASIS - eval and Rx per DrTafeen prev on Humira injections- stopped 9/11...   Past Surgical History  Procedure Laterality Date  . Cholecystectomy    . Vesicovaginal fistula closure w/  total abdominal hysterectomy    . Bilateral total knee replacements  Rt=5/04 & Lft=1/09    by DrAlusio  . Tonsillectomy    . Appendectomy    . Abdominal hysterectomy    . Breast reduction surgery  1982  . Open splenectomy  [id number]    by DrRosenbower    Outpatient Encounter Prescriptions as of 10/25/2012  Medication Sig Dispense Refill  . albuterol (  PROVENTIL HFA;VENTOLIN HFA) 108 (90 BASE) MCG/ACT inhaler Inhale 2 puffs into the lungs every 6 (six) hours as needed. For shortness of breath.      Marland Kitchen albuterol (PROVENTIL) (2.5 MG/3ML) 0.083% nebulizer solution Take 2.5 mg by nebulization 4 (four) times daily. Shortness of breath      . allopurinol (ZYLOPRIM) 100 MG tablet Take 1 tablet (100 mg total) by mouth daily.  30 tablet  0  . amLODipine (NORVASC) 10 MG tablet Take 10 mg  by mouth daily.      Marland Kitchen aspirin EC 81 MG tablet Take 81 mg by mouth daily.      Marland Kitchen atorvastatin (LIPITOR) 20 MG tablet Take 20 mg by mouth daily.      . cloNIDine (CATAPRES) 0.1 MG tablet Take 0.1 mg by mouth 2 (two) times daily.      Marland Kitchen Dextromethorphan-Guaifenesin (TUSSIN DM) 10-100 MG/5ML liquid Take 5 mLs by mouth daily as needed (for cough).      . esomeprazole (NEXIUM) 40 MG capsule Take 40 mg by mouth 2 (two) times daily.      . ferrous sulfate 325 (65 FE) MG tablet Take 325 mg by mouth daily with breakfast.      . fluticasone (FLONASE) 50 MCG/ACT nasal spray Place 2 sprays into the nose at bedtime.  48 g  3  . Fluticasone-Salmeterol (ADVAIR) 250-50 MCG/DOSE AEPB Inhale 1 puff into the lungs 2 (two) times daily.      . furosemide (LASIX) 20 MG tablet Take 20 mg by mouth daily.      Marland Kitchen glucose blood test strip 1 each by Other route as needed for other. Use as instructed      . guaiFENesin (MUCINEX) 600 MG 12 hr tablet Take 1,200 mg by mouth daily.      . hydrALAZINE (APRESOLINE) 10 MG tablet Take 10 mg by mouth 3 (three) times daily.      . insulin detemir (LEVEMIR) 100 UNIT/ML injection Inject 80 Units into the skin every morning.       . insulin glulisine (APIDRA SOLOSTAR) 100 UNIT/ML injection Inject 20-34 Units into the skin 3 (three) times daily before meals. Use 20 units before breakfast and 34 units before lunch and 30 units before dinner      . ketorolac (ACULAR LS) 0.4 % SOLN Place 1 drop into both eyes 3 (three) times daily.       . Lancets MISC 1 Units by Does not apply route 4 (four) times daily - after meals and at bedtime.      Marland Kitchen LORazepam (ATIVAN) 1 MG tablet Take 1 mg by mouth daily as needed. For anxiety.      . metoprolol tartrate (LOPRESSOR) 25 MG tablet Take 12.5 mg by mouth daily.      . potassium chloride (K-DUR) 10 MEQ tablet Take 2 tablets (20 mEq total) by mouth daily.  30 tablet  0  . predniSONE (DELTASONE) 20 MG tablet Take 2 tablets (40 mg total) by mouth daily.  30  tablet  0  . pregabalin (LYRICA) 100 MG capsule Take 100 mg by mouth every evening.      . promethazine (PHENERGAN) 25 MG tablet Take 1 tablet (25 mg total) by mouth every 6 (six) hours as needed for nausea.  20 tablet  0  . sennosides-docusate sodium (SENOKOT-S) 8.6-50 MG tablet Take 1 tablet by mouth at bedtime.      . polyethylene glycol (MIRALAX / GLYCOLAX) packet Take 17 g by mouth 2 (  two) times daily.  14 each  0   No facility-administered encounter medications on file as of 10/25/2012.    Allergies  Allergen Reactions  . Adhesive (Tape) Other (See Comments)    Blisters   . Avelox (Moxifloxacin Hcl In Nacl)     GI upset  . Codeine Other (See Comments)    "crazy"   . Guaifenesin Nausea And Vomiting  . Latex Swelling  . Oxycodone Nausea And Vomiting    Current Medications, Allergies, Past Medical History, Past Surgical History, Family History, and Social History were reviewed in [employer] record.   Review of Systems         See HPI - all other systems neg except as noted... The patient complains of dyspnea on exertion.  The patient denies anorexia, fever, weight loss, weight gain, vision loss, decreased hearing, hoarseness, chest pain, syncope, peripheral edema, prolonged cough, headaches, hemoptysis, abdominal pain, melena, hematochezia, severe indigestion/heartburn, hematuria, incontinence, muscle weakness, suspicious skin lesions, transient blindness, difficulty walking, depression, unusual weight change, abnormal bleeding, enlarged lymph nodes, and angioedema.     Objective:   Physical Exam      WD, Overweight, 66 y/o BF in NAD...  GENERAL:  Alert & oriented; pleasant & cooperative... HEENT:  [place]/AT, EOM-wnl, PERRLA, EACs-clear, TMs-wnl, NOSE-clear, THROAT- clear & wnl... NECK:  Supple w/ fair ROM; no JVD; normal carotid impulses w/o bruits; palp thyroid, w/o nodules felt; no lymphadenopathy. CHEST:  Clear to P & A; without wheezes/ rales/ or  rhonchi. HEART:  Regular Rhythm; gr 1/6 SEM, without rubs/ or gallops. ABDOMEN:  Scar from splenectomy surg; obese, soft, & nontender w/ panniculus; normal bowel sounds; no organomegaly or masses detected EXT: without deformities, mod arthritic changes, s/p bilat TKRs; no varicose veins/ +venous insuffic/ tr edema. NEURO:  CN's intact; no focal neuro deficits... DERM:  Psoriasis rash per DrTafeen...  RADIOLOGY DATA:  Reviewed in the EPIC EMR & discussed w/ the patient...  LABORATORY DATA:  Reviewed in the EPIC EMR & discussed w/ the patient...   Assessment & Plan:    ASTHMATIC BRONCHITS>  ER 11/13 bumped her Pred to 40mg /d which negated our slow taper w/ monitoring of her Calcium levels; NEB Rx w/ albut seemed to give the the max benefit & we need to wean down the Pred as much as poss; she is also hypoxemic w/ O2sat=84% on RA (likely from V/Q mismatching);  We decided to Rx w/ HomeO2- 1L/min rest, 2L/min exercise for now; continue home meds + add NEBS Qid => continue same...  HBP>  On same 5 med regimen & BP is fair; she is sched to see Cards soon & we will wait & see if they adjust meds...  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  SARCOIDOSIS>  Main manifestation has been her hypercalcemia & back to 11.0 w/ ACE up to 86 ([id number]); We incr Pred to 10mg /d==> Improved w/ Ca=10.0 & then weaned further w/ Ca=9.1 on Pred 5mg /d... When we tried to wean down to 5mg Qod her calcium rises again indicating her threshold... For now we will decr the 20mg Bid to 20mg /d & f/u 3wks....  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  DM>  Control has been difficult;  Stressed diet/ exercise, & get wt down; DrA has her on Levemir & ApidraSS coverage...  Renal Insuffic>  Creat was up to 2.4 in [place] & then back to 1.6 but now back to 2.3 w/ Lasix at 20mg /d & BNP=104; after that we held the Lasix, then restarted 20mg  when the BNP jumped...  Other medical problems as noted...   Patient's Medications  New Prescriptions   No medications on file  Previous Medications   ALBUTEROL (PROVENTIL HFA;VENTOLIN HFA) 108 (90 BASE) MCG/ACT INHALER    Inhale 2 puffs into the lungs every 6 (six) hours as needed. For shortness of breath.   ALBUTEROL (PROVENTIL) (2.5 MG/3ML) 0.083% NEBULIZER SOLUTION    Take 2.5 mg by nebulization 4 (four) times daily. Shortness of breath   ALLOPURINOL (ZYLOPRIM) 100 MG TABLET    Take 1 tablet (100 mg total) by mouth daily.   AMLODIPINE (NORVASC) 10 MG TABLET    Take 10 mg by mouth daily.   ASPIRIN EC 81 MG TABLET    Take 81 mg by mouth daily.   ATORVASTATIN (LIPITOR) 20 MG TABLET    Take 20 mg by mouth daily.   CLONIDINE (CATAPRES) 0.1 MG TABLET    Take 0.1 mg by mouth 2 (two) times daily.   DEXTROMETHORPHAN-GUAIFENESIN (TUSSIN DM) 10-100 MG/5ML LIQUID    Take 5 mLs by mouth daily as needed (for cough).   ESOMEPRAZOLE (NEXIUM) 40 MG CAPSULE    Take 40 mg by mouth 2 (two) times daily.   FERROUS SULFATE 325 (65 FE) MG TABLET    Take 325 mg by mouth daily with breakfast.   FLUTICASONE (FLONASE) 50 MCG/ACT NASAL SPRAY    Place 2 sprays into the nose at bedtime.   FLUTICASONE-SALMETEROL (ADVAIR) 250-50 MCG/DOSE AEPB    Inhale 1 puff into the lungs 2 (two) times daily.   FUROSEMIDE (LASIX) 20 MG TABLET    Take 20 mg by mouth daily.   GLUCOSE BLOOD TEST STRIP    1 each by Other route as needed for other. Use as instructed   GUAIFENESIN  (MUCINEX) 600 MG 12 HR TABLET    Take 1,200 mg by mouth daily.   HYDRALAZINE (APRESOLINE) 10 MG TABLET    Take 10 mg by mouth 3 (three) times daily.   INSULIN DETEMIR (LEVEMIR) 100 UNIT/ML INJECTION    Inject 80 Units into the skin every morning.    INSULIN GLULISINE (APIDRA SOLOSTAR) 100 UNIT/ML INJECTION    Inject 20-34 Units into the skin 3 (three) times daily before meals. Use 20 units before breakfast and 34 units before lunch and 30 units before dinner   KETOROLAC (ACULAR LS) 0.4 % SOLN    Place 1 drop into both eyes 3 (three) times daily.    LANCETS MISC    1 Units by Does not apply route 4 (four) times daily - after meals and at bedtime.   LORAZEPAM (ATIVAN) 1 MG TABLET    Take 1 mg by mouth daily as needed. For anxiety.   METOPROLOL TARTRATE (LOPRESSOR) 25 MG TABLET    Take 12.5 mg by mouth daily.   POLYETHYLENE GLYCOL (MIRALAX / GLYCOLAX) PACKET    Take 17  g by mouth 2 (two) times daily.   POTASSIUM CHLORIDE (K-DUR) 10 MEQ TABLET    Take 2 tablets (20 mEq total) by mouth daily.   PREDNISONE (DELTASONE) 20 MG TABLET    Take 2 tablets (40 mg total) by mouth daily.   PREGABALIN (LYRICA) 100 MG CAPSULE    Take 100 mg by mouth every evening.   PROMETHAZINE (PHENERGAN) 25 MG TABLET    Take 1 tablet (25 mg total) by mouth every 6 (six) hours as needed for nausea.   SENNOSIDES-DOCUSATE SODIUM (SENOKOT-S) 8.6-50 MG TABLET    Take 1 tablet by mouth at bedtime.  Modified Medications   No medications on file  Discontinued Medications   No medications on file

## 2012-10-25 NOTE — Patient Instructions (Addendum)
Today we updated your med list in our EPIC system...    We decided to decrease the Prednisone to 20mg  daily...  We reviewed the blood work done at DrAltheimer's office yest 3/25...  Proceed w/ the planned cardiac eval from [facility] & DrAllred...  Let's plan a follow up visit in 3-4 weeks as we slowly wean the Prednisone back down (titrating it to your serum calcium levels)...  Call for any questions.Marland KitchenMarland Kitchen

## 2012-10-26 LAB — PTH-RELATED PEPTIDE: PTH-related peptide: 18 pg/mL (ref 14–27)

## 2012-10-27 ENCOUNTER — Inpatient Hospital Stay: Payer: BC Managed Care – PPO | Admitting: Pulmonary Disease

## 2012-11-06 ENCOUNTER — Telehealth: Payer: Self-pay | Admitting: Cardiology

## 2012-11-06 ENCOUNTER — Other Ambulatory Visit: Payer: Self-pay | Admitting: *Deleted

## 2012-11-06 DIAGNOSIS — I472 Ventricular tachycardia: Secondary | ICD-10-CM

## 2012-11-06 NOTE — Telephone Encounter (Signed)
New problem   Pt stated someone was suppose to call her to sched a stress test w/contrast after her hospital stay. Please call pt concerning this matter.

## 2012-11-08 ENCOUNTER — Ambulatory Visit (INDEPENDENT_AMBULATORY_CARE_PROVIDER_SITE_OTHER): Payer: BC Managed Care – PPO | Admitting: Pulmonary Disease

## 2012-11-08 ENCOUNTER — Other Ambulatory Visit (INDEPENDENT_AMBULATORY_CARE_PROVIDER_SITE_OTHER): Payer: BC Managed Care – PPO

## 2012-11-08 ENCOUNTER — Encounter: Payer: Self-pay | Admitting: Pulmonary Disease

## 2012-11-08 VITALS — BP 128/70 | HR 58 | Temp 98.0°F | Ht 62.5 in | Wt 204.6 lb

## 2012-11-08 DIAGNOSIS — I5032 Chronic diastolic (congestive) heart failure: Secondary | ICD-10-CM

## 2012-11-08 DIAGNOSIS — N259 Disorder resulting from impaired renal tubular function, unspecified: Secondary | ICD-10-CM

## 2012-11-08 DIAGNOSIS — F411 Generalized anxiety disorder: Secondary | ICD-10-CM

## 2012-11-08 DIAGNOSIS — D869 Sarcoidosis, unspecified: Secondary | ICD-10-CM

## 2012-11-08 DIAGNOSIS — I472 Ventricular tachycardia: Secondary | ICD-10-CM

## 2012-11-08 DIAGNOSIS — E669 Obesity, unspecified: Secondary | ICD-10-CM

## 2012-11-08 DIAGNOSIS — E7800 Pure hypercholesterolemia, unspecified: Secondary | ICD-10-CM

## 2012-11-08 DIAGNOSIS — E119 Type 2 diabetes mellitus without complications: Secondary | ICD-10-CM

## 2012-11-08 DIAGNOSIS — K219 Gastro-esophageal reflux disease without esophagitis: Secondary | ICD-10-CM

## 2012-11-08 DIAGNOSIS — I10 Essential (primary) hypertension: Secondary | ICD-10-CM

## 2012-11-08 LAB — BASIC METABOLIC PANEL
BUN: 51 mg/dL — ABNORMAL HIGH (ref 6–23)
CO2: 26 mEq/L (ref 19–32)
Calcium: 9.4 mg/dL (ref 8.4–10.5)
GFR: 30.77 mL/min — ABNORMAL LOW (ref >60.00)
Glucose, Bld: 133 mg/dL — ABNORMAL HIGH (ref 70–99)

## 2012-11-08 MED ORDER — ALBUTEROL SULFATE HFA 108 (90 BASE) MCG/ACT IN AERS: 2.0000 | INHALATION_SPRAY | Freq: Four times a day (QID) | RESPIRATORY_TRACT | Status: DC | PRN

## 2012-11-08 NOTE — Progress Notes (Signed)
Subjective:    Patient ID: [name], female    DOB: 1946-05-02, 67 y.o.   MRN: [id number]  HPI 67 y/o BF here for a follow up visit... he has multiple medical problems as noted below...    ~  August 16, 2011:  6wk ROV & post hosp check> she was Adm by [facility] 1/8 - 08/12/11 w/ dyspnea (DOE, orthopnea, edema) thought to be due to diastolic heart failure (2DEcho w/ Gr2DD, BNP=2200), she was diuresed & improved, disch on LASIX40mg - 1/2 tab each am & her prev Diovan was stopped (due to renal insuffic w/ Creat=2.4)...  Her serum Calcium levels were all norm 9.6-9.8;  A1c=9.3;  Hg=10.4>> See below...    Feeling better since disch;  Weight 196# is still 8# heavier than her last visit w/ me 11/12;  We decided to decr her Pred from 10-5 Qod to 5mg /d & keep Lasix at 20mg /d... LABS 1/13:  BUN=33, Creat=1.7, BNP=100;  Ca=9.7;  Hg=12.9;  BS=69 & she will f/u w/ DrAltheimer...  ~  October 06, 2011:  16mo ROV on Pred 5mg /d & generally stable but w/ mult minor somatic complaints- sl SOB, some cough, thick white mucous, right arm discomfort, & reflux symptoms;  She had a ZPak called in about 2wks ago;  She is on a good antireflux regimen, plus Flonase & Saline;  We will add Tussionex for prn use;  Unfortunately weight is up 9# further tp 205# today;  BP remains controlled on 5 MEDS (see below);  She continues to f/u w/ DrAltheimer for DM> on Levimir 46u & Apidra 03-15-13;  We decided to cut the Pred to 5mg  QOD & keep the Lasix 20mg /d... LABS 3/13:  BS=135,  BUN=38, Creat=1.6;  Ca=10.1;  ACE=48;  BNP=128...  ~  January 06, 2012:  5mo ROV & Ludia has been on Pred5mg Qod for the last 5mo; she has noted some joint pains & has rash on her arms- seeing derm w/ hx Psoriasis but she feels their topical rx isn't working; she also notes sl cough, some greenish sput production, chest feels tight, & some right sided chest discomfort- she is requesting a ZPak which seems to work for her...     She saw DrAlt 4/13> Ave BS from her  machine 249 & A1c=9.1, insulin adjusted; FLP looked good on Lip20; extensive note reviewed...    [name], meds, xrays and labs> see below>> LABS 6/13:  Chems- BS=169 BUN=41 Creat=2.3 BNP=104 Ca=11.0 ACE=86;  CBC- ok w/ Hg=12.4 REC> Stop Lasix20 for now, increase Pred to 10mg /d, ROV 6wks w/ BMet...  ~  March 30, 2012:  5mo ROV & post-ER visit> she's been to the ER twice in the last 16mo w/ SOB, cough, congestion, etc; on Pred, Advair, Mucinex, Tussionex, & given antibiotics, Neb Rx, etc & improved;  Also rec to restart     She saw DrWall for Cards f/u 7/13> known CAD, DOE, good BP control & 2DEcho showed good LVF & no evid for pulmHTN; continue same meds, & Lasix...    She had f/u DrAltheimer 6/13> DM, renal insuffic, hyperlipidemia, hypercalcemia, etc; on Levemir80, Apidra20-34-30, [id number] & Amaryl4; BS=118 A1c=8.8.Marland KitchenMarland Kitchen [name], meds, xrays and labs> see below for updates >> CXR 8/13 showed cardiomeg, ?mild interstitial edema, mild DJD TSpine... EKG 8/13 showed NSR, rate93, min NSSTTWA, NAD... LABS 8/13:  CBC- Hg=11.9 WBC=18K;  Chems- BUN=30 Creat=1.6 Ca=10.0 BNP=2960 ==> started back on Lasix40/d...  ~  April 28, 2012:  53mo ROV & last  visit we decreased the Pred to 5mg /d (on this for hypercalcemia from Sarcoid) & decreased the Lasix to 20mg /d (due to RI w/ Cr=2.2);  Follow up labs today showed Ca=9.1 and we decided to decr the Pred further to 5mg  Qod;  In addition her BUN=40, Creat=2.1, BNP=196 (ok to continue Lasix20); she understands that if her Creat starts to rise further that we will have to send her to Nephrology...    She saw DrAltheimer for f/u 9/13> note reviewed & he adjusted her insulin regimen- Levemir 80uQam & Apidra 20-34-30 + SS adjustments...    She fell 8/30 & hit her head> went to ER & DrLockwood's note is reviewed- c/o headache & left knee pain; Exam showed forehead hematoma & CTBrain showed left frontal scalp hematoma, no fx, mild cortical atrophy,  mild carotid atherosclerosis, NAD;  Maxillofacial CT was neg w/o facial bone fx;  Left knee prosthesis & no acute changes...    She reports that she just had an ESI injection from DrRamos for LBP & she is improved...    [name], meds, xrays and labs> see below for updates >> LABS 9/13:  Chems- BS=234, Ca=9.1, BUN=40, Creat=2.1, BNP=196   ~  June 21, 2012:  8mo ROV & Caci went to the ER 06/14/12 w/ c/o SOB> she had developed a sore throat, wheezing, cough, & SOB; she had taken ZPak, Delsym, Proair which helped somewhat;  CXR showed cardiomeg, pulm vasc congestion, DJD in TSpine, NAD;  ER treated w/ Pred boost to 40mg /d, & NEB Rx which really helped;  She notes stuff head & some drainage, denies GI/ reflux symptoms;  She is obese, cushingoid, and we were in the process of weaning off the Pred when it was boosted up by ER- we gave her a NEB Rx & this really helped;  Placed on O2 by nasal cannula for hypoxemia- 84% on RA;  Decision made to wean Pred down to 5mg Qod as before, continue Advair250Bid, add NEBS Qid w/ Albut vs Proair, plus her Mucinex/ Fluids/ etc...    She continues her regular f/u appts w/ DrAltheimer, Endocrine> on Levemir, Apidra, Amaryl, Januvia; last A1c=8.1 is Sep2013...    [name], meds, xrays and labs> see below for updates >>   ~  October 25, 2012:  8mo ROV & post hospital visit> she was [facility] 3/18 - 10/22/12> presented w/ weakness, difficulty ambulating, sl confusion w/ memory loss; she had been to DrAltheimer's office & labs showed Ca>14 w/ Creat>4;  Hypercalcemia related to Sarcoidosis- treated w/ IVF, Lasix, Calcitonin, & Solumedrol- ACE level=80;  She was disch on Pred20Bid but we will need to wean this quickly due to her DM;  She had acute renal failure as well w/ Creat>4 and after hydration she was disch w/ Creat ~2;  DM followed & treated by DrAltheimer;  DCSummary reviewed in detail>>    Sarcoidosis w/ HYPERCALCEMIA> Hypercalcemia returned  when Pred was cut to 5mg  Qod; now back on Pred20Bid post hosp & Ca=10.6; we discussed wean to 20mg /d w/ ROV & recheck Ca level in 3 weeks...     HBP> on Metop25- 1/2, Norvasc10, Apres10Tid, Clonidine0.1Bid, Lasix20, K10-2/d; BP= 160/72 & she denies CP, palpit, dizzy, ch in SOB/ DOE, edema, etc...    Cardiac Arrhythmia> she has WCT & SBrady- eval by DrAllred for Cards & meds adjusted...    CHOL> on Lip20; last FLP was 4/12- looked good & we reviewed low fat diet restrictions...    DM  on Insulin> followed by DrA on Levemir30 & ApidraSS coverage at meals; BS at home varies 150-400+, labs done Q58mo by DrAltheimer...    Obesity> wt= 203# (63"Tall & BMI=37) and we reviewed diet, exercise, & wt reduction strategies...    GI> GERD on NexiumBid; and we reviewed antireflux regimen (elev HOB, npo after dinner, etc)...    Renal Insuffic> Creat has returned to 2.08 now; advised incr free water intake...    Anxiety> on Alprazolam prn; she's been under extra stress w/ loss of job, this illness, family issues... [name], meds, xrays and labs> see below for updates >>   ~  November 08, 2012:  2week ROV & she has been stable on Pred20mg /d; still tired, notes DOE w/ walking; BMet today shows K=5.1, BUN=51, Cr=2.1, Ca=9.4; we discussed decr Pred to 10mg /d, incr free water intake, continue Lasix20; ROV 55mo...    Meds reviewed> HBP controlled on her ; Chol is ok on Lip20; DM regulated by DrAltheimer on Levimir & Apidra; wt is unfortunately up to 205# & we reviewed diet/ exercise/ etc; she is coping well w/ her serious medical problems...         Problem List:    OBSTRUCTIVE SLEEP APNEA - Sleep Study 11/07 showed RDI=21 w/ desat to 62% during REM.Marland Kitchen. eval by DrSood w/ Rx for CPAP 12... compliance poor- only using it 1-2 times/wk... encouraged to use CPAP more regularly & to f/u w/ DrSood regarding the mask interface problems... ~  8/11:  she reports new mask but still not using CPAP regularly, "I rest  fairly well". ~  7/12:  She reports not using her CPAP, and not resting well... ~  1/13:  CPAP used briefly during [facility] for diastolic CHF, but still not compliant w/ home use...  BRONCHITIS, ACUTE - on ADVAIR 250Bid (using Prn only) & PROAIR as needed. 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 ~  CXR 1/09 was pre-op for TKR- sl cardomeg & ?mild vasc congestion. ~  CXR 2/11 showed norm heart size & vascularity, clear, s/p cholecystectomy, DJD in TSpine. ~  CXR & CT Chest 11/11 showed sl peribronch thickening, no lung lesions, no signif adenopathy. ~  PET scan 1/12 showed hypermetabolic activ in spleen & lymph nodes in the supraclav, hilar, mediastinal, periaotic & iliac region as well. ~  CXRs 3/12 in the perioperative period after splenectomy showed left effusion==> tapped & improved aeration. ~  CXR 4/12 back to baseline w/ clear bases, essent wnl... ~  CXR 7/12 showed norm heart size, clear lungs, no definite adenopathy, NAD... ~  LABS 7/12 w/ ACE=78 (8-52), Ca=12.0, AlkPhos=176 (39-117), GGT=151 (7-51) ~  Labs from DrAltheimer> 8/12: Ca=12.4, Creat=1.8; then 9/12: Ca=13.4, Creat=2.3;  ~  10/12:  PREDNISONE 20mg /d started 10/12 & 3wks later Ca=10.3, Creat=1.8; therefore weaned to 20-10 Qod. ~  11/12: Labs on Pred 20-10 Qod showed Ca++= 9.8; we will continue to wean Pred. ~  1/13:  CXR showed cardiomeg, mild interstitial edema, DJD spine; Labs showed Ca=9.7 & we decided to wean Pred to 5mg /d... ~  3/13:  Labs showed Ca=10.1 on Pred 5mg /d; decided to wean to 5mg Qod... ~  6/13:  Labs showed Ca=11.0, ACE=86; on Pred5Qod & rec to incr back to 10mg /d... ~  8/13:  CXR showed cardiomeg, ?mild interstitial edema, mild DJD TSpine... ~  Labs 8/13 showed Ca=10.0 on Pred10/d; rec to decr further to 5mg /d... ~  Labs 9/13 showed Ca= 9.1 on Pred5mg /d & she will  try to decr to 5Qod again... ~  11/13: she recently went to ER w/ incr SOB & was placed on  Pred40/d; we are adding NEBS w/ ALBUT2.5mg Qid & wean Pred back to 5mg Qod as before. ~  3/14: she was hosp w/ Ca>14 (on the Pred5mg Qod); treated w/ IVF, Lasix, Calcitonin & incr steroids; Disch on Pred20Bid; we will wean Pred to 20mg /d & f/u in 3wk. ~  4/14:  On Pred20/d & calcium improved to 9.4; we will decr to Pred10mg /d & f/u 22mo...  HYPERTENSION - taking METOPROLOL 25mg - 1/2 daily, NORVASC 10mg /d, APRESOLINE 10mg Tid, CLONIDINE 0.1mg Bid, LASIX 20mg - see below; off prev [id number] due to azotemia w/ diuresis. ~  4/12:  BP here 132/62 and she denies HA, visual changes, CP, palipit, dizziness, syncope, change in dyspnea, etc... ~  10/12:  BP= 132/80 & she denies CP, palpit, dizzy, ch in SOB/ DOE, edema, etc... ~  11/12:  BP= 132/82 & she is relatively asymptomatic other than DOE from her weight and some musc cramps... ~  1/13: Post Hosp BP= 146/70 on ? - Metop12.5, Amlod10, Clonid0.1Bid, Lasix20; she denies CP, palpit, SOB, edema> improved from recent hosp... ~  3/13: She called in the interim for "refill" Apresoline not prev on any med list; BP= 150/80 on all 5 meds; must elim sodium & get wt down... ~  6/13:  BP= 142/80 on but BUN-41 Creat=2.3 BNP=104; rec to stop Lasix for now, f/u BMet 6wks. ~  8/13:  BP= 140/70 on same meds & BUN=30, Creat=1.6, but BNP incr to 2960 & LASIX 40mg /d restarted... ~  9/13:  Follow up labs showed BUN=45, Creat=2.2 on Lasix40 and we rec decr to Lasix20... ~  9/13:  BP= 122/74 on w/ BUN=40, Creat=2.1, & BNP=196... Continue same. ~  11/13:  BP= 142/68 on same ... ~  3/14:  BP= 160/72 on same 5 meds for now... ~  4/14:  BP= 128/70 on her regimen...   CAD & CARDIAC ARRHYTHMIA >> on ASA 81mg /d...  ~  Hx non-obstructive CAD w/ cath 6/07 by DrStuckey showing luminal irregularities & tiny first marginal branch of the CIRC w/ ostial lesion... good LVF. ~  2DEcho 9/07 showed mildly calcif AoV and normal LVF & wall motion. ~  2DEcho 11/11 showed norm  LV wall thickness, norm LVF w/ EF= 60-65%, norm atria, norm valves, trivial peric fluid behind heart. ~  2DEcho 1/13 showed mild LVH, norm LVF w/ EF=55-60% & no regional wall motion abn, Gr2DD, norm RV... ~  3/14: In hosp for hypercalcemia etc> she had cardiac arrhythmia w/ WCT & SBrady- to be f/u DrAllred...  CEREBROVASCULAR DISEASE - she remains on ASA 81mg /d without TIA's or other neuro manifestations...  ~  MRA Br 9/07 showed mod intracranial atherosclerotic changes...   HYPERLIPIDEMIA - on LIPITOR 20mg /d... FLP monitored at each OV by DrAltheimer... ~  FLP 7/07 showed TChol 114, TG 85, HDL 28, LDL 69 ~  FLP 4/09 showed TChol 154, TG 192, HDL 34, LDL 82... discussed poss of adding fibrate- hold... ~  FLP 2/10 on Lip20 showed TChol 159, TG 207, HDL 29, LDL 83... rec> add Fenoglide (she didn't). ~  FLP 2/11 on Lip20 showed TChol 185, TG 242, HDL 43, LDL 99... add FENOFIBRATE 160mg /d... ~  FLP 8/11 on Lip20+Feno160 showed TChol 167, TG 216, HDL 37, LDL 96... she stopped Feno160. ~  FLP 4/12 on Lip20 showed TChol 146, TG 115, HDL 35, LDL 88 ~  FLP 2/13 on Lip20 showed TChol 159,  TG 176, HDL 41, LDL 86  DM - on LEVEMIR & APIDRA SS coverage now per DrAltheimer; plus GLIMEPIRIDE 4mg /d and JANUVIA 50mg /d. ~  labs 4/08 showed BS=138 & HgA1c=7.5.Marland Kitchen. home BS = 100 to >300... misses doses and not on diet or exercising... ~  1/09 became hypoglycemic in hosp on 4 meds post knee surg- meds adjusted... ~  3/09 restarted GLIMEPIRIDE 4mg - 1/2 tab each AM, & continue Lantus & Metformin... ~  labs 4/09 showed BS= 148, HgA1c= 7.3.Marland Kitchen. rec> back on 4 med regimen due to poor control at home. ~  6/09 discussed titrating the Lantus up until FBS 100-120 range... ~  labs 2/10 showed BS= 164, HgA1c= 8.6.Marland KitchenMarland Kitchen very disappointing- needs home BS monitoring, incr Lantus. ~  labs 2/11 showed BS= 298, A1c= 8.4...  discussed change Lantus to LEVEMIR 40 u daily... ~  labs 8/11 showed BS= 202, A1c= 10.9.Marland Kitchen. rec> incr Levemir to 50,  consider Humalog. ~  Nov-Dec 2011:  BS up w/ adjust of meds & addition of Pred after hosp 11/11 & hypercalcemia... ~  She states she returned to Levemir 40u daily after the splenectomy hosp due to appetite etc;  4/12 BS=117 ~  Labs 7/12 showed BS= 167, A1c= 8.4> on Levemir 50u/d & Amaryl 4mg  Qam (note creat 1.9). ~  8-10/12: on Levemir30 + ApidraSS per DrA & BS are 150-400+ due to the Pred20; A1c 8/12 was 7.6 ~  11/12:  DrA has increased her Levemir to 46u/d & the Apidra to 10+SS cover Tid... BS here= 168 ~  1/13: Covered w/ SSI in Hosp but A1c=9.3 (Creat up to 2.4 but improved to 1.7 w/ adjust meds)... ~  DrAltheimer's notes indicates continued adjustments in her insulin regimen w/ Levemir & Apidra...  OBESITY (ICD-278.00) - weight down to 171 after hosp 11/11- doing better on diet, not yet exercising. ~  weight 220-230# in the early 1990's... ~  weight 210-220# in the early 2000's... ~  weight 2/10 = 192# ~  weight 2/11 = 186# ~  weight 8/11 = 174# ~  weight 12/11 = 171# ~  Weight 4/12 = 168# ~  Weight 7/12 = 171# ~  Weight 10/12 = 180# ~  Weight 11/12 = 188# ~  Weight 1/13 = 196# ~  Weight 3/13 = 205#... She MUST get back on diet, incr exercise, get wt down. ~  Weight 6/13 = 205# ~  Weight 8/13 = 210# ~  Weight 9/13 = 220#... What happened? ~  Weight 11/13 = 225# and BMI= 40 ~  Weight 3/14 = 203#  HYPERCALCEMIA> this was most likely due to sarcoidosis w/ extensive splenic involvement;  Calcium was as hight as 13.7 and returned to normal after the splenectomy. ~  Labs 4/12 showed calcium = 11.1 & this is very disappointing... ~  Labs 7/12 showed Ca= 12.0, Phos= 4.0, ACE= 78 ~  Labs 8-10/12 showed Ca up to 13.4, then down to 10.3 on PRED20mg /d... ~  Labs 11/12 showed Ca= 9.8 on Pred 20-10 qod; we will continue to wean. ~  Labs 1/13 in [place] showed Ca= 9.7 range on Pred 10-5 Qod;  We decided to wean Pred to 5mg /d... ~  Labs 3/13 showed Ca=10.1 on Pred5mg /d; rec to decr to  5mg Qod... ~  Labs 6/13 showed Ca=11.0 on Pred5Qod; rec incr to 10mg /d... ~  Labs 8/13 showed Ca=10.0 on Pred10/d; rec to decr to 5mg /d... ~  Labs 9/13 showed Ca=9.1 on Pred5mg /d; rec to decr to 5mg Qod... ~  Labs 12/13 showed  Ca= 9.2 to 10.5 on Pred 5mg Qod... ~  Labs 3/14 in hosp showed Ca= 14.5 & disch on Pred20Bid; 3/14 f/u Ca=10.6 & Pred decr to 20mg /d... ~  4/14: on Pred20/d and f/u labs showed Ca= 9.4... Rec to decr Pred to 10mg /d & f/u 62mo...  GERD - on NEXIUM 40mg  Bid... w/ increased reflux symptoms w/ noct cough etc...  ~  2/10: discussed optimal Rx w/ Nex before dinner, Zantac300 + Reglan10 at bed, elev HOB, etc. ~  2/11: she stopped the Reglan, still using Nexium/ Zantac but w/ persist symptoms>  refer to GI for eval. ~  6/11: GI f/u DrPerry w/ EGD that was normal... continue Rx. ~  12/11:  increased GI symptoms post hosp> she will f/u w/ DrPerry for his input> incr Nexium Bid.  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 ~  6/11:  f/u colonoscopy by DrPerry showed divertics, 4 polyps, AVM.Marland KitchenMarland Kitchen path= tubular adenoma, f/u 49yrs.  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 ~  Note: Serum Calcium ret to normal immediately after spleen removed...  RENAL INSUFFICIENCY (ICD-588.9) >> Creat ~ 2.0 & eval 10/11 by DrFox- prob due to DM & HBP... ~  Labs 4/12 showed BUN= 27, Creat= 1.6 ~  Labs 7/12 showed BUN= 44, Creat= 1.9 (not on diuretics/ NSAIDs/ etc)... ~  Labs showed Creat up to 2.3 when Ca=13.4; now improved to 1.8 w/ Ca coming down... ~  Labs 11/12 showed BUN=38, Creat=1.7; rec to maintain hydration... ~  Labs 1/13 in [place] w/ diuresis showed Creat incr to 2.4 but ret to 1.7 w/ adjustment in meds... ~  Labs 3/13 showed BUN=38, Creat= 1.6 ~   Labs 6/13 showed BUN=41, Creat= 2.3; rec> stop Lasix20 for now, liberalize fluids. ~  Labs 9/13 showed BUN=40, Creat=2.1 on Lasix20, rec to continue the same. ~  Labs 12/13 showed Creat= 1.5 ~  Labs 3/14 in hosp showed Creat=4, then decr to 2.00 by dischage... ~  Labs 4/14 showed BUN=51, Creat=2.1, and rec to incr free water as we wean the Pred...  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 on LYRICA 100mg  Qhs, off prev Tramadol Rx... ~  2010 eval by DrHiatt @ [facility] on Tramadol 50mg , Lyrica 75mg Bid...  GOUT, UNSPECIFIED (ICD-274.9) - Uric in the 7-8 range...on ALLOPURINOL 300mg /d for prevention...  ANXIETY (ICD-300.00) - she is under mod stress- work, mother died, hospice counselling, etc... ~  8/11:  rec starting Alprazolam 0.5mg  Tid for palpit, SOB, anxiety...  ANEMIA-NOS & MGUS >> see eval 10-11/11 by DrGranfortuna w/ bone marrow 11/11 in hosp= neg. ~  Labs 4/12 showed Hg= 11.6, MCV= 84... On FeSO4 daily. ~  Labs 7/12 showed Hg= 12.8, MCV= 90, Fe= 45 (14%sat)... ~  Labs 1/13 in [place] showed Hg=10.4 but improved to 12.9 on post hosp check in office... ~  Labs 6/13 showed Hg= 12.4 ~  Labs 12/13 showed Hg= 13.9 ~  Labs 3/14 showed Hg= 12.9  PSORIASIS - eval and Rx per DrTafeen prev on Humira injections- stopped 9/11...   Past Surgical History  Procedure Laterality Date  . Cholecystectomy    . Vesicovaginal fistula closure w/  total abdominal hysterectomy    . Bilateral total knee replacements  Rt=5/04 & Lft=1/09    by DrAlusio  .  Tonsillectomy    . Appendectomy    . Abdominal hysterectomy    . Breast reduction surgery  1982  . Open splenectomy  [id number]    by DrRosenbower    Outpatient Encounter Prescriptions as of 11/08/2012  Medication Sig Dispense Refill  . albuterol (PROVENTIL HFA;VENTOLIN HFA) 108 (90 BASE) MCG/ACT inhaler Inhale 2 puffs into the lungs every 6 (six) hours as needed. For shortness of breath.  8.5  g  5  . albuterol (PROVENTIL) (2.5 MG/3ML) 0.083% nebulizer solution Take 2.5 mg by nebulization 4 (four) times daily. Shortness of breath      . allopurinol (ZYLOPRIM) 100 MG tablet Take 1 tablet (100 mg total) by mouth daily.  30 tablet  0  . amLODipine (NORVASC) 10 MG tablet Take 10 mg by mouth daily.      Marland Kitchen aspirin EC 81 MG tablet Take 81 mg by mouth daily.      Marland Kitchen atorvastatin (LIPITOR) 20 MG tablet Take 20 mg by mouth daily.      . cloNIDine (CATAPRES) 0.1 MG tablet Take 0.1 mg by mouth 2 (two) times daily.      Marland Kitchen Dextromethorphan-Guaifenesin (TUSSIN DM) 10-100 MG/5ML liquid Take 5 mLs by mouth daily as needed (for cough).      . esomeprazole (NEXIUM) 40 MG capsule Take 40 mg by mouth 2 (two) times daily.      . ferrous sulfate 325 (65 FE) MG tablet Take 325 mg by mouth daily with breakfast.      . fluticasone (FLONASE) 50 MCG/ACT nasal spray Place 2 sprays into the nose at bedtime.  48 g  3  . Fluticasone-Salmeterol (ADVAIR) 250-50 MCG/DOSE AEPB Inhale 1 puff into the lungs 2 (two) times daily.      . furosemide (LASIX) 20 MG tablet Take 20 mg by mouth daily.      Marland Kitchen glucose blood test strip 1 each by Other route as needed for other. Use as instructed      . guaiFENesin (MUCINEX) 600 MG 12 hr tablet Take 1,200 mg by mouth daily.      . hydrALAZINE (APRESOLINE) 10 MG tablet Take 10 mg by mouth 3 (three) times daily.      . insulin detemir (LEVEMIR) 100 UNIT/ML injection Inject 80 Units into the skin every morning.       . insulin glulisine (APIDRA SOLOSTAR) 100 UNIT/ML injection Use 20 units before breakfast and 34 units before lunch and 30 units before dinner      . ketorolac (ACULAR LS) 0.4 % SOLN Place 1 drop into both eyes 3 (three) times daily.       . Lancets MISC 1 Units by Does not apply route 4 (four) times daily - after meals and at bedtime.      Marland Kitchen LORazepam (ATIVAN) 1 MG tablet Take 1 mg by mouth daily as needed. For anxiety.      . metoprolol tartrate (LOPRESSOR) 25 MG tablet  Take 12.5 mg by mouth daily.      . predniSONE (DELTASONE) 20 MG tablet Take 20 mg by mouth daily.      . pregabalin (LYRICA) 100 MG capsule Take 100 mg by mouth every evening.      . promethazine (PHENERGAN) 25 MG tablet Take 1 tablet (25 mg total) by mouth every 6 (six) hours as needed for nausea.  20 tablet  0  . sennosides-docusate sodium (SENOKOT-S) 8.6-50 MG tablet Take 1 tablet by mouth at bedtime.      . [  DISCONTINUED] albuterol (PROVENTIL HFA;VENTOLIN HFA) 108 (90 BASE) MCG/ACT inhaler Inhale 2 puffs into the lungs every 6 (six) hours as needed. For shortness of breath.      . [DISCONTINUED] predniSONE (DELTASONE) 20 MG tablet Take 2 tablets (40 mg total) by mouth daily.  30 tablet  0  . potassium chloride (K-DUR) 10 MEQ tablet Take 2 tablets (20 mEq total) by mouth daily.  30 tablet  0  . [DISCONTINUED] polyethylene glycol (MIRALAX / GLYCOLAX) packet Take 17 g by mouth 2 (two) times daily.  14 each  0   No facility-administered encounter medications on file as of 11/08/2012.    Allergies  Allergen Reactions  . Adhesive (Tape) Other (See Comments)    Blisters   . Avelox (Moxifloxacin Hcl In Nacl)     GI upset  . Codeine Other (See Comments)    "crazy"   . Guaifenesin Nausea And Vomiting  . Latex Swelling  . Oxycodone Nausea And Vomiting    Current Medications, Allergies, Past Medical History, Past Surgical History, Family History, and Social History were reviewed in [employer] record.   Review of Systems         See HPI - all other systems neg except as noted... The patient complains of dyspnea on exertion.  The patient denies anorexia, fever, weight loss, weight gain, vision loss, decreased hearing, hoarseness, chest pain, syncope, peripheral edema, prolonged cough, headaches, hemoptysis, abdominal pain, melena, hematochezia, severe indigestion/heartburn, hematuria, incontinence, muscle weakness, suspicious skin lesions, transient blindness,  difficulty walking, depression, unusual weight change, abnormal bleeding, enlarged lymph nodes, and angioedema.     Objective:   Physical Exam      WD, Overweight, 66 y/o BF in NAD...  GENERAL:  Alert & oriented; pleasant & cooperative... HEENT:  [place]/AT, EOM-wnl, PERRLA, EACs-clear, TMs-wnl, NOSE-clear, THROAT- clear & wnl... NECK:  Supple w/ fair ROM; no JVD; normal carotid impulses w/o bruits; palp thyroid, w/o nodules felt; no lymphadenopathy. CHEST:  Clear to P & A; without wheezes/ rales/ or rhonchi. HEART:  Regular Rhythm; gr 1/6 SEM, without rubs/ or gallops. ABDOMEN:  Scar from splenectomy surg; obese, soft, & nontender w/ panniculus; normal bowel sounds; no organomegaly or masses detected EXT: without deformities, mod arthritic changes, s/p bilat TKRs; no varicose veins/ +venous insuffic/ tr edema. NEURO:  CN's intact; no focal neuro deficits... DERM:  Psoriasis rash per DrTafeen...  RADIOLOGY DATA:  Reviewed in the EPIC EMR & discussed w/ the patient...  LABORATORY DATA:  Reviewed in the EPIC EMR & discussed w/ the patient...   Assessment & Plan:    SARCOIDOSIS>  Main manifestation has been her hypercalcemia> she has proven that when her Pred is weaned to 5mg Qod the hypercalcemia returns; we are trying to determine her lowest dose threshold & Ca=10.6 on Pred20Bid post hosp 3/14, weaned to Pred20mg /d & labs today (4/14) showed Ca= 9.4, therefore wean further to Pred10mg /d...  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    ASTHMATIC BRONCHITS>  ER 11/13 bumped her Pred to 40mg /d which negated our slow taper w/ monitoring of her Calcium levels; NEB Rx w/ albut seemed to give the the  max benefit & we need to wean down  the Pred as much as poss; she is also hypoxemic w/ O2sat=84% on RA (likely from V/Q mismatching);  We decided to Rx w/ HomeO2- 1L/min rest, 2L/min exercise for now; continue home meds + add NEBS Qid => continue same...  HBP>  On same 5 med regimen & BP is fair; she is sched to see Cards soon & we will wait & see if they adjust meds... 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  DM>  Control has been difficult;  Stressed diet/ exercise, & get wt down; DrA has her on Levemir & ApidraSS coverage...  Renal Insuffic>  Creat was up to 4 in [place] & then back to 2.0 w/ Lasix at 20mg /d; continue same...  Other medical problems as noted...   Patient's Medications  New Prescriptions   No medications on file  Previous Medications   ALBUTEROL (PROVENTIL) (2.5 MG/3ML) 0.083% NEBULIZER SOLUTION    Take 2.5 mg by nebulization 4 (four) times daily. Shortness of breath   ALLOPURINOL (ZYLOPRIM) 100 MG TABLET    Take 1 tablet (100 mg total) by mouth daily.   AMLODIPINE (NORVASC) 10 MG TABLET    Take 10 mg by mouth daily.   ASPIRIN EC 81 MG TABLET    Take 81 mg by mouth daily.   ATORVASTATIN (LIPITOR) 20 MG TABLET    Take 20 mg by mouth daily.   CLONIDINE (CATAPRES) 0.1 MG TABLET    Take 0.1 mg by mouth 2 (two) times daily.   DEXTROMETHORPHAN-GUAIFENESIN (TUSSIN DM) 10-100 MG/5ML LIQUID    Take 5 mLs by mouth daily as needed (for cough).   ESOMEPRAZOLE (NEXIUM) 40 MG CAPSULE    Take 40 mg by mouth 2 (two) times daily.   FERROUS SULFATE 325 (65 FE) MG TABLET    Take 325 mg by mouth daily with breakfast.   FLUTICASONE (FLONASE) 50 MCG/ACT NASAL SPRAY    Place 2 sprays into the nose at bedtime.   FLUTICASONE-SALMETEROL (ADVAIR) 250-50 MCG/DOSE AEPB    Inhale 1 puff into  the lungs 2 (two) times daily.   FUROSEMIDE (LASIX) 20 MG TABLET    Take 20 mg by mouth daily.   GLUCOSE BLOOD TEST STRIP    1 each by Other route as needed for other. Use as instructed   GUAIFENESIN (MUCINEX) 600 MG 12 HR TABLET    Take 1,200 mg by mouth daily.   HYDRALAZINE (APRESOLINE) 10 MG TABLET    Take 10 mg by mouth 3 (three) times daily.   INSULIN DETEMIR (LEVEMIR) 100 UNIT/ML INJECTION    Inject 80 Units into the skin every morning.    INSULIN GLULISINE (APIDRA SOLOSTAR) 100 UNIT/ML INJECTION    Use 20 units before breakfast and 34 units before lunch and 30 units before dinner   KETOROLAC (ACULAR LS) 0.4 % SOLN    Place 1 drop into both eyes 3 (three) times daily.    LANCETS MISC    1 Units by Does not apply route 4 (four) times daily - after meals and at bedtime.   LORAZEPAM (ATIVAN) 1 MG TABLET    Take 1 mg by mouth daily as needed. For anxiety.   METOPROLOL TARTRATE (LOPRESSOR) 25 MG TABLET    Take 12.5 mg by mouth  daily.   POTASSIUM CHLORIDE (K-DUR) 10 MEQ TABLET    Take 2 tablets (20 mEq total) by mouth daily.   PREGABALIN (LYRICA) 100 MG CAPSULE    Take 100 mg by mouth every evening.   PROMETHAZINE (PHENERGAN) 25 MG TABLET    Take 1 tablet (25 mg total) by mouth every 6 (six) hours as needed for nausea.   SENNOSIDES-DOCUSATE SODIUM (SENOKOT-S) 8.6-50 MG TABLET    Take 1 tablet by mouth at bedtime.  Modified Medications   Modified Medication Previous Medication   ALBUTEROL (PROVENTIL HFA;VENTOLIN HFA) 108 (90 BASE) MCG/ACT INHALER albuterol (PROVENTIL HFA;VENTOLIN HFA) 108 (90 BASE) MCG/ACT inhaler      Inhale 2 puffs into the lungs every 6 (six) hours as needed. For shortness of breath.    Inhale 2 puffs into the lungs every 6 (six) hours as needed. For shortness of breath.   PREDNISONE (DELTASONE) 20 MG TABLET predniSONE (DELTASONE) 20 MG tablet      Take 20 mg by mouth daily.    Take 2 tablets (40 mg total) by mouth daily.  Discontinued Medications   POLYETHYLENE GLYCOL  (MIRALAX / GLYCOLAX) PACKET    Take 17 g by mouth 2 (two) times daily.

## 2012-11-08 NOTE — Patient Instructions (Addendum)
Today we updated your med list in our EPIC system...    Continue your current medications the same for now...  Today we did your follow up metabolic panel...    We will call you w/ the results & make any needed med adjustments then...    We will also sent a copy to DrAltheimer...  Let's plan a follow up visit in 35mo, sooner if needed for problems.Marland KitchenMarland Kitchen

## 2012-11-10 ENCOUNTER — Telehealth: Payer: Self-pay | Admitting: Pulmonary Disease

## 2012-11-10 NOTE — Telephone Encounter (Signed)
Called and spoke with pt and she is aware of lab results per SN.    Med list has been updated today and pt is aware of new med changes.  Pt voiced her understanding and nothing further is needed.

## 2012-11-10 NOTE — Telephone Encounter (Signed)
Re: results

## 2012-11-14 ENCOUNTER — Ambulatory Visit (HOSPITAL_COMMUNITY): Payer: BC Managed Care – PPO | Attending: Cardiology | Admitting: Radiology

## 2012-11-14 VITALS — BP 155/68 | Ht 62.5 in | Wt 202.0 lb

## 2012-11-14 DIAGNOSIS — J449 Chronic obstructive pulmonary disease, unspecified: Secondary | ICD-10-CM | POA: Insufficient documentation

## 2012-11-14 DIAGNOSIS — Z794 Long term (current) use of insulin: Secondary | ICD-10-CM | POA: Insufficient documentation

## 2012-11-14 DIAGNOSIS — R0989 Other specified symptoms and signs involving the circulatory and respiratory systems: Secondary | ICD-10-CM | POA: Insufficient documentation

## 2012-11-14 DIAGNOSIS — R0609 Other forms of dyspnea: Secondary | ICD-10-CM | POA: Insufficient documentation

## 2012-11-14 DIAGNOSIS — I10 Essential (primary) hypertension: Secondary | ICD-10-CM | POA: Insufficient documentation

## 2012-11-14 DIAGNOSIS — R002 Palpitations: Secondary | ICD-10-CM | POA: Insufficient documentation

## 2012-11-14 DIAGNOSIS — I472 Ventricular tachycardia: Secondary | ICD-10-CM

## 2012-11-14 DIAGNOSIS — R5381 Other malaise: Secondary | ICD-10-CM | POA: Insufficient documentation

## 2012-11-14 DIAGNOSIS — E119 Type 2 diabetes mellitus without complications: Secondary | ICD-10-CM | POA: Insufficient documentation

## 2012-11-14 DIAGNOSIS — J4489 Other specified chronic obstructive pulmonary disease: Secondary | ICD-10-CM | POA: Insufficient documentation

## 2012-11-14 MED ORDER — REGADENOSON 0.4 MG/5ML IV SOLN
0.4000 mg | Freq: Once | INTRAVENOUS | Status: AC
  Administered 2012-11-14: 0.4 mg via INTRAVENOUS

## 2012-11-14 MED ORDER — TECHNETIUM TC 99M SESTAMIBI GENERIC - CARDIOLITE
33.0000 | Freq: Once | INTRAVENOUS | Status: AC | PRN
  Administered 2012-11-14: 33 via INTRAVENOUS

## 2012-11-14 MED ORDER — TECHNETIUM TC 99M SESTAMIBI GENERIC - CARDIOLITE
11.0000 | Freq: Once | INTRAVENOUS | Status: AC | PRN
  Administered 2012-11-14: 11 via INTRAVENOUS

## 2012-11-14 NOTE — Progress Notes (Signed)
[facility] SITE 3 NUCLEAR MED [address] [place], [place] [address] [phone]    Cardiology Nuclear Med Study  [name] is a 67 y.o. female     MRN : [id number]     DOB: [birth year]  Procedure Date: 11/14/2012  Nuclear Med Background Indication for Stress Test:  Evaluation for Ischemia History:  Asthma, COPD and 2007 Heart Cath: N/O Dz well preserved LVF, 10/20/2012  Cardiac Risk Factors: Hypertension, IDDM Type 2 and Lipids  Symptoms:  DOE, Fatigue and Palpitations   Nuclear Pre-Procedure Caffeine/Decaff Intake:  None > 12 hrs NPO After: 8:00am   Lungs:  clear O2 Sat: 96% on room air. IV 0.9% NS with Angio Cath:  22g  IV Site: R Antecubital x 1, tolerated well IV Started by:  Irean Hong, RN  Chest Size (in):  42 Cup Size: DD  Height: 5' 2.5" (1.588 m)  Weight:  202 lb (91.627 kg)  BMI:  Body mass index is 36.33 kg/(m^2). Tech Comments: Held Lopressor x 24 hrs. Took Nebulizer treatment 10:00am @ home. Fasting CBG was 130 @7 :15 am, and the patient took full dose of insulin at breakfast. On arrival, 4 hr PC CBG was 129@ 12:15pm.  Irean Hong, RN     Nuclear Med Study 1 or 2 day study: 1 day  Stress Test Type:  Eugenie Birks  Reading MD: Marca Ancona, MD  Order Authorizing Provider:  Willa Rough, MD  Resting Radionuclide: Technetium 68m Sestamibi  Resting Radionuclide Dose: 11.0 mCi   Stress Radionuclide:  Technetium 88m Sestamibi  Stress Radionuclide Dose: 33.0 mCi           Stress Protocol Rest HR: 54 Stress HR: 78  Rest BP: 155/68 Stress BP: 172/77  Exercise Time (min): n/a METS: n/a   Predicted Max HR: 156 bpm % Max HR: 50 bpm Rate Pressure Product: 21308   Dose of Adenosine (mg):  n/a Dose of Lexiscan: 0.4 mg  Dose of Atropine (mg): n/a Dose of Dobutamine: n/a mcg/kg/min (at max HR)  Stress Test Technologist: Milana Na, EMT-P  Nuclear Technologist:  Domenic Polite, CNMT     Rest Procedure:  Myocardial perfusion imaging was  performed at rest 45 minutes following the intravenous administration of Technetium 64m Sestamibi. Rest ECG: NSR - Normal EKG  Stress Procedure:  The patient received IV Lexiscan 0.4 mg over 15-seconds.  Technetium 89m Sestamibi injected at 30-seconds. This patient was sob and had abdominal pain with the Lexiscan injection. This patient was in and out of a junctional rhythm with pjcs/pacs/pvcs during the test. Quantitative spect images were obtained after a 45 minute delay. Stress ECG: No significant change from baseline ECG  QPS Raw Data Images:  Normal; no motion artifact; normal heart/lung ratio. Stress Images:  Normal homogeneous uptake in all areas of the myocardium. Rest Images:  Normal homogeneous uptake in all areas of the myocardium. Subtraction (SDS):  There is no evidence of scar or ischemia. Transient Ischemic Dilatation (Normal <1.22):  1.20 Lung/Heart Ratio (Normal <0.45):  0.35  Quantitative Gated Spect Images QGS EDV:  121 ml QGS ESV:  46 ml  Impression Exercise Capacity:  Lexiscan with no exercise. BP Response:  Normal blood pressure response. Clinical Symptoms:  Shortness of breath.  ECG Impression:  No significant ST segment change suggestive of ischemia. Comparison with Prior Nuclear Study: No images to compare  Overall Impression:  Normal stress nuclear study.  LV Ejection Fraction: 62%.  LV Wall Motion:  NL LV Function; NL  Wall Motion  Marca Ancona 11/14/2012

## 2012-11-15 ENCOUNTER — Other Ambulatory Visit: Payer: Self-pay | Admitting: Pulmonary Disease

## 2012-11-18 ENCOUNTER — Encounter: Payer: Self-pay | Admitting: Cardiology

## 2012-11-18 NOTE — Progress Notes (Signed)
   The patient had been in the hospital in March, 2014. There is a history of sarcoid and hypercalcemia. The patient had wide complex tachycardia in the hospital. The plan was for the patient to have a stress study post hospital and a followup with Dr. Johney Frame to assess the issue of wide complex tachycardia in a patient with sarcoid disease. The nuclear stress test came to my box and I have reviewed it. Fortunately it is normal. I will make arrangements for the patient have a followup visit with Dr. Leona Singleton, MD

## 2012-11-21 ENCOUNTER — Other Ambulatory Visit: Payer: Self-pay | Admitting: *Deleted

## 2012-11-22 ENCOUNTER — Telehealth: Payer: Self-pay | Admitting: Pulmonary Disease

## 2012-11-22 MED ORDER — PREDNISONE 20 MG PO TABS: 10.0000 mg | ORAL_TABLET | Freq: Every day | ORAL | Status: DC

## 2012-11-22 MED ORDER — PREGABALIN 100 MG PO CAPS: 100.0000 mg | ORAL_CAPSULE | Freq: Every evening | ORAL | Status: DC

## 2012-11-22 NOTE — Telephone Encounter (Signed)
Rx has been sent in for Prednisone. Spoke to pt to let her know that this had been done. Advised her that we would have to get the okay from SN to refill Lyrica.  SN please advise if it would be okay to give her Lyrica. According to her chart we haven't given this to her since 03/2012.

## 2012-11-22 NOTE — Telephone Encounter (Signed)
Called and spoke with pt and she is aware of rx that has been called to the pharmacy for the lyrica 100 mg  Daily.  Given 5 refills.  Pt is aware and nothing further is needed.

## 2012-11-29 ENCOUNTER — Encounter: Payer: Self-pay | Admitting: Cardiology

## 2012-11-30 ENCOUNTER — Other Ambulatory Visit (HOSPITAL_COMMUNITY): Payer: Self-pay | Admitting: Pulmonary Disease

## 2012-12-04 ENCOUNTER — Other Ambulatory Visit: Payer: Self-pay | Admitting: Pulmonary Disease

## 2012-12-12 ENCOUNTER — Other Ambulatory Visit: Payer: Self-pay | Admitting: Pulmonary Disease

## 2012-12-15 ENCOUNTER — Other Ambulatory Visit: Payer: Self-pay | Admitting: Pulmonary Disease

## 2012-12-19 ENCOUNTER — Encounter: Payer: Self-pay | Admitting: Pulmonary Disease

## 2012-12-19 ENCOUNTER — Ambulatory Visit (INDEPENDENT_AMBULATORY_CARE_PROVIDER_SITE_OTHER): Payer: BC Managed Care – PPO | Admitting: Pulmonary Disease

## 2012-12-19 VITALS — BP 138/72 | HR 65 | Temp 98.3°F | Ht 63.0 in | Wt 207.2 lb

## 2012-12-19 DIAGNOSIS — E119 Type 2 diabetes mellitus without complications: Secondary | ICD-10-CM

## 2012-12-19 DIAGNOSIS — E669 Obesity, unspecified: Secondary | ICD-10-CM

## 2012-12-19 DIAGNOSIS — I679 Cerebrovascular disease, unspecified: Secondary | ICD-10-CM

## 2012-12-19 DIAGNOSIS — I10 Essential (primary) hypertension: Secondary | ICD-10-CM

## 2012-12-19 DIAGNOSIS — E785 Hyperlipidemia, unspecified: Secondary | ICD-10-CM

## 2012-12-19 DIAGNOSIS — D869 Sarcoidosis, unspecified: Secondary | ICD-10-CM

## 2012-12-19 DIAGNOSIS — N259 Disorder resulting from impaired renal tubular function, unspecified: Secondary | ICD-10-CM

## 2012-12-19 DIAGNOSIS — G4733 Obstructive sleep apnea (adult) (pediatric): Secondary | ICD-10-CM

## 2012-12-19 DIAGNOSIS — M1990 Unspecified osteoarthritis, unspecified site: Secondary | ICD-10-CM

## 2012-12-19 DIAGNOSIS — F411 Generalized anxiety disorder: Secondary | ICD-10-CM

## 2012-12-19 DIAGNOSIS — I2510 Atherosclerotic heart disease of native coronary artery without angina pectoris: Secondary | ICD-10-CM

## 2012-12-19 MED ORDER — ESOMEPRAZOLE MAGNESIUM 40 MG PO CPDR: 40.0000 mg | DELAYED_RELEASE_CAPSULE | Freq: Two times a day (BID) | ORAL | Status: DC

## 2012-12-19 NOTE — Patient Instructions (Addendum)
Today we updated your med list in our EPIC system...    Continue your current medications the same...  As we discussed> DECREASE the Prednisone 20mg  tabs to 1/2 tab (10mg ) each AM...  Call for any questions...  Let's plan a follow up visit in 48mo, sooner if needed for problems.Marland KitchenMarland Kitchen

## 2012-12-19 NOTE — Progress Notes (Signed)
Subjective:    Patient ID: [name], female    DOB: 1946/07/19, 67 y.o.   MRN: [id number]  HPI 67 y/o BF here for a follow up visit... he has multiple medical problems as noted below...    ~  August 16, 2011:  6wk ROV & post hosp check> she was Adm by [facility] 1/8 - 08/12/11 w/ dyspnea (DOE, orthopnea, edema) thought to be due to diastolic heart failure (2DEcho w/ Gr2DD, BNP=2200), she was diuresed & improved, disch on LASIX40mg - 1/2 tab each am & her prev Diovan was stopped (due to renal insuffic w/ Creat=2.4)...  Her serum Calcium levels were all norm 9.6-9.8;  A1c=9.3;  Hg=10.4>> See below...    Feeling better since disch;  Weight 196# is still 8# heavier than her last visit w/ me 11/12;  We decided to decr her Pred from 10-5 Qod to 5mg /d & keep Lasix at 20mg /d... LABS 1/13:  BUN=33, Creat=1.7, BNP=100;  Ca=9.7;  Hg=12.9;  BS=69 & she will f/u w/ [name]...  ~  October 06, 2011:  69mo ROV on Pred 5mg /d & generally stable but w/ mult minor somatic complaints- sl SOB, some cough, thick white mucous, right arm discomfort, & reflux symptoms;  She had a ZPak called in about 2wks ago;  She is on a good antireflux regimen, plus Flonase & Saline;  We will add Tussionex for prn use;  Unfortunately weight is up 9# further tp 205# today;  BP remains controlled on 5 MEDS (see below);  She continues to f/u w/ [name] for DM> on Levimir 46u & Apidra 03-15-13;  We decided to cut the Pred to 5mg  QOD & keep the Lasix 20mg /d... LABS 3/13:  BS=135,  BUN=38, Creat=1.6;  Ca=10.1;  ACE=48;  BNP=128...  ~  January 06, 2012:  97mo ROV & [name] has been on Pred5mg Qod for the last 97mo; she has noted some joint pains & has rash on her arms- seeing derm w/ hx Psoriasis but she feels their topical rx isn't working; she also notes sl cough, some greenish sput production, chest feels tight, & some right sided chest discomfort- she is requesting a ZPak which seems to work for her...     She saw DrAlt 4/13> Ave BS from her  machine 249 & A1c=9.1, insulin adjusted; FLP looked good on Lip20; extensive note reviewed...    [name], meds, xrays and labs> see below>> LABS 6/13:  Chems- BS=169 BUN=41 Creat=2.3 BNP=104 Ca=11.0 ACE=86;  CBC- ok w/ Hg=12.4 REC> Stop Lasix20 for now, increase Pred to 10mg /d, ROV 6wks w/ BMet...  ~  March 30, 2012:  97mo ROV & post-ER visit> she's been to the ER twice in the last 69mo w/ SOB, cough, congestion, etc; on Pred, Advair, Mucinex, Tussionex, & given antibiotics, Neb Rx, etc & improved;  Also rec to restart     She saw [name] for Cards f/u 7/13> known CAD, DOE, good BP control & 2DEcho showed good LVF & no evid for pulmHTN; continue same meds, & Lasix...    She had f/u [name] 6/13> DM, renal insuffic, hyperlipidemia, hypercalcemia, etc; on Levemir80, Apidra20-34-30, [id number] & Amaryl4; BS=118 A1c=8.8.Marland KitchenMarland Kitchen [name], meds, xrays and labs> see below for updates >> CXR 8/13 showed cardiomeg, ?mild interstitial edema, mild DJD TSpine... EKG 8/13 showed NSR, rate93, min NSSTTWA, NAD... LABS 8/13:  CBC- Hg=11.9 WBC=18K;  Chems- BUN=30 Creat=1.6 Ca=10.0 BNP=2960 ==> started back on Lasix40/d...  ~  April 28, 2012:  74mo ROV & last  visit we decreased the Pred to 5mg /d (on this for hypercalcemia from Sarcoid) & decreased the Lasix to 20mg /d (due to RI w/ Cr=2.2);  Follow up labs today showed Ca=9.1 and we decided to decr the Pred further to 5mg  Qod;  In addition her BUN=40, Creat=2.1, BNP=196 (ok to continue Lasix20); she understands that if her Creat starts to rise further that we will have to send her to Nephrology...    She saw [name] for f/u 9/13> note reviewed & he adjusted her insulin regimen- Levemir 80uQam & Apidra 20-34-30 + SS adjustments...    She fell 8/30 & hit her head> went to ER & [name]'s note is reviewed- c/o headache & left knee pain; Exam showed forehead hematoma & CTBrain showed left frontal scalp hematoma, no fx, mild cortical atrophy,  mild carotid atherosclerosis, NAD;  Maxillofacial CT was neg w/o facial bone fx;  Left knee prosthesis & no acute changes...    She reports that she just had an ESI injection from [name] for LBP & she is improved...    [name], meds, xrays and labs> see below for updates >> LABS 9/13:  Chems- BS=234, Ca=9.1, BUN=40, Creat=2.1, BNP=196   ~  June 21, 2012:  32mo ROV & [name] went to the ER 06/14/12 w/ c/o SOB> she had developed a sore throat, wheezing, cough, & SOB; she had taken ZPak, Delsym, Proair which helped somewhat;  CXR showed cardiomeg, pulm vasc congestion, DJD in TSpine, NAD;  ER treated w/ Pred boost to 40mg /d, & NEB Rx which really helped;  She notes stuff head & some drainage, denies GI/ reflux symptoms;  She is obese, cushingoid, and we were in the process of weaning off the Pred when it was boosted up by ER- we gave her a NEB Rx & this really helped;  Placed on O2 by nasal cannula for hypoxemia- 84% on RA;  Decision made to wean Pred down to 5mg Qod as before, continue Advair250Bid, add NEBS Qid w/ Albut vs Proair, plus her Mucinex/ Fluids/ etc...    She continues her regular f/u appts w/ [name], Endocrine> on Levemir, Apidra, Amaryl, Januvia; last A1c=8.1 is Sep2013...    [name], meds, xrays and labs> see below for updates >>   ~  October 25, 2012:  83mo ROV & post [name] visit> she was [name] by [name] 3/18 - 10/22/12> presented w/ weakness, difficulty ambulating, sl confusion w/ memory loss; she had been to [name]'s office & labs showed Ca>14 w/ Creat>4;  Hypercalcemia related to Sarcoidosis- treated w/ IVF, Lasix, Calcitonin, & Solumedrol- ACE level=80;  She was disch on Pred20Bid but we will need to wean this quickly due to her DM;  She had acute renal failure as well w/ Creat>4 and after hydration she was disch w/ Creat ~2;  DM followed & treated by [name];  DCSummary reviewed in detail>>    Sarcoidosis w/ HYPERCALCEMIA> Hypercalcemia returned  when Pred was cut to 5mg  Qod; now back on Pred20Bid post hosp & Ca=10.6; we discussed wean to 20mg /d w/ ROV & recheck Ca level in 3 weeks...     HBP> on Metop25- 1/2, Norvasc10, Apres10Tid, Clonidine0.1Bid, Lasix20, K10-2/d; BP= 160/72 & she denies CP, palpit, dizzy, ch in SOB/ DOE, edema, etc...    Cardiac Arrhythmia> she has WCT & SBrady- eval by DrAllred for Cards & meds adjusted...    CHOL> on Lip20; last FLP was 4/12- looked good & we reviewed low fat diet restrictions...    DM  on Insulin> followed by DrA on Levemir30 & ApidraSS coverage at meals; BS at home varies 150-400+, labs done Q47mo by [name]...    Obesity> wt= 203# (63"Tall & BMI=37) and we reviewed diet, exercise, & wt reduction strategies...    GI> GERD on NexiumBid; and we reviewed antireflux regimen (elev HOB, npo after dinner, etc)...    Renal Insuffic> Creat has returned to 2.08 now; advised incr free water intake...    Anxiety> on Alprazolam prn; she's been under extra stress w/ loss of job, this illness, family issues... [name], meds, xrays and labs> see below for updates >>   ~  November 08, 2012:  2week ROV & she has been stable on Pred20mg /d; still tired, notes DOE w/ walking; BMet today shows K=5.1, BUN=51, Cr=2.1, Ca=9.4; we discussed decr Pred to 10mg /d, incr free water intake, continue Lasix20; ROV 32mo...    Meds reviewed> HBP controlled on her ; Chol is ok on Lip20; DM regulated by [name] on Levimir & Apidra; wt is unfortunately up to 205# & we reviewed diet/ exercise/ etc; she is coping well w/ her serious medical problems...  ~  Dec 19, 2012:  6wk ROV & recheck after her last visit w/ stable numbers on Pred20mg /d; we decided to decr to 10mg /d & follow up at this time to recheck her Calcium etc; unfortunately she made a mistake 7 has continued the Pred at the 20mg /d dose since her last visit... Feeling OK overall, her wt is up 3# to 207# today, BP= 138/72, and she recently had f/u [name] to  recheck her DM (insulin adjusted)...  We reviewed her medication record & her procedures and instructed her to decr the Pred to 10mg /d at this point... We wil  Recheck pt in 6-8weeks w/ BMet at that time...          Problem List:    OBSTRUCTIVE SLEEP APNEA - Sleep Study 11/07 showed RDI=21 w/ desat to 62% during REM.Marland Kitchen. eval by DrSood w/ Rx for CPAP 12... compliance poor- only using it 1-2 times/wk... encouraged to use CPAP more regularly & to f/u w/ DrSood regarding the mask interface problems... ~  8/11:  she reports new mask but still not using CPAP regularly, "I rest fairly well". ~  7/12:  She reports not using her CPAP, and not resting well... ~  1/13:  CPAP used briefly during [facility] for diastolic CHF, but still not compliant w/ home use...  BRONCHITIS, ACUTE - on ADVAIR 250Bid (using Prn only) & PROAIR as needed. 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 ~  CXR 1/09 was pre-op for TKR- sl cardomeg & ?mild vasc congestion. ~  CXR 2/11 showed norm heart size & vascularity, clear, s/p cholecystectomy, DJD in TSpine. ~  CXR & CT Chest 11/11 showed sl peribronch thickening, no lung lesions, no signif adenopathy. ~  PET scan 1/12 showed hypermetabolic activ in spleen & lymph nodes in the supraclav, hilar, mediastinal, periaotic & iliac region as well. ~  CXRs 3/12 in the perioperative period after splenectomy showed left effusion==> tapped & improved aeration. ~  CXR 4/12 back to baseline w/ clear bases, essent wnl... ~  CXR 7/12 showed norm heart size, clear lungs, no definite adenopathy, NAD... ~  LABS 7/12 w/ ACE=78 (8-52), Ca=12.0, AlkPhos=176 (39-117), GGT=151 (7-51) ~  Labs from [name]> 8/12: Ca=12.4, Creat=1.8; then 9/12: Ca=13.4, Creat=2.3;  ~  10/12:  PREDNISONE 20mg /d started 10/12 & 3wks later Ca=10.3, Creat=1.8; therefore weaned  to 20-10 Qod. ~  11/12: Labs on Pred 20-10 Qod showed Ca++= 9.8; we will continue to wean Pred. ~   1/13:  CXR showed cardiomeg, mild interstitial edema, DJD spine; Labs showed Ca=9.7 & we decided to wean Pred to 5mg /d... ~  3/13:  Labs showed Ca=10.1 on Pred 5mg /d; decided to wean to 5mg Qod... ~  6/13:  Labs showed Ca=11.0, ACE=86; on Pred5Qod & rec to incr back to 10mg /d... ~  8/13:  CXR showed cardiomeg, ?mild interstitial edema, mild DJD TSpine... ~  Labs 8/13 showed Ca=10.0 on Pred10/d; rec to decr further to 5mg /d... ~  Labs 9/13 showed Ca= 9.1 on Pred5mg /d & she will try to decr to 5Qod again... ~  11/13: she recently went to ER w/ incr SOB & was placed on Pred40/d; we are adding NEBS w/ ALBUT2.5mg Qid & wean Pred back to 5mg Qod as before. ~  3/14: she was hosp w/ Ca>14 (on the Pred5mg Qod); treated w/ IVF, Lasix, Calcitonin & incr steroids; Disch on Pred20Bid; we will wean Pred to 20mg /d & f/u in 3wk. ~  4/14:  On Pred20/d & calcium improved to 9.4; we will decr to Pred10mg /d & f/u 64mo...  HYPERTENSION - taking METOPROLOL 25mg - 1/2 daily, NORVASC 10mg /d, APRESOLINE 10mg Tid, CLONIDINE 0.1mg Bid, LASIX 20mg - see below; off prev [id number] due to azotemia w/ diuresis. ~  4/12:  BP here 132/62 and she denies HA, visual changes, CP, palipit, dizziness, syncope, change in dyspnea, etc... ~  10/12:  BP= 132/80 & she denies CP, palpit, dizzy, ch in SOB/ DOE, edema, etc... ~  11/12:  BP= 132/82 & she is relatively asymptomatic other than DOE from her weight and some musc cramps... ~  1/13: Post Hosp BP= 146/70 on ? - Metop12.5, Amlod10, Clonid0.1Bid, Lasix20; she denies CP, palpit, SOB, edema> improved from recent hosp... ~  3/13: She called in the interim for "refill" Apresoline not prev on any med list; BP= 150/80 on all 5 meds; must elim sodium & get wt down... ~  6/13:  BP= 142/80 on but BUN-41 Creat=2.3 BNP=104; rec to stop Lasix for now, f/u BMet 6wks. ~  8/13:  BP= 140/70 on same meds & BUN=30, Creat=1.6, but BNP incr to 2960 & LASIX 40mg /d restarted... ~  9/13:  Follow up labs showed  BUN=45, Creat=2.2 on Lasix40 and we rec decr to Lasix20... ~  9/13:  BP= 122/74 on w/ BUN=40, Creat=2.1, & BNP=196... Continue same. ~  11/13:  BP= 142/68 on same ... ~  3/14:  BP= 160/72 on same 5 meds for now... ~  4/14:  BP= 128/70 on her regimen...   CAD & CARDIAC ARRHYTHMIA >> on ASA 81mg /d...  ~  Hx non-obstructive CAD w/ cath 6/07 by DrStuckey showing luminal irregularities & tiny first marginal branch of the CIRC w/ ostial lesion... good LVF. ~  2DEcho 9/07 showed mildly calcif AoV and normal LVF & wall motion. ~  2DEcho 11/11 showed norm LV wall thickness, norm LVF w/ EF= 60-65%, norm atria, norm valves, trivial peric fluid behind heart. ~  2DEcho 1/13 showed mild LVH, norm LVF w/ EF=55-60% & no regional wall motion abn, Gr2DD, norm RV... ~  3/14: In hosp for hypercalcemia etc> she had cardiac arrhythmia w/ WCT & SBrady- to be f/u DrAllred...  CEREBROVASCULAR DISEASE - she remains on ASA 81mg /d without TIA's or other neuro manifestations...  ~  MRA Br 9/07 showed mod intracranial atherosclerotic changes...   HYPERLIPIDEMIA - on LIPITOR 20mg /d... FLP monitored at each OV  by [name]... ~  FLP 7/07 showed TChol 114, TG 85, HDL 28, LDL 69 ~  FLP 4/09 showed TChol 154, TG 192, HDL 34, LDL 82... discussed poss of adding fibrate- hold... ~  FLP 2/10 on Lip20 showed TChol 159, TG 207, HDL 29, LDL 83... rec> add Fenoglide (she didn't). ~  FLP 2/11 on Lip20 showed TChol 185, TG 242, HDL 43, LDL 99... add FENOFIBRATE 160mg /d... ~  FLP 8/11 on Lip20+Feno160 showed TChol 167, TG 216, HDL 37, LDL 96... she stopped Feno160. ~  FLP 4/12 on Lip20 showed TChol 146, TG 115, HDL 35, LDL 88 ~  FLP 2/13 on Lip20 showed TChol 159, TG 176, HDL 41, LDL 86  DM - on LEVEMIR & APIDRA SS coverage now per [name]; plus GLIMEPIRIDE 4mg /d and JANUVIA 50mg /d. ~  labs 4/08 showed BS=138 & HgA1c=7.5.Marland Kitchen. home BS = 100 to >300... misses doses and not on diet or exercising... ~  1/09 became  hypoglycemic in hosp on 4 meds post knee surg- meds adjusted... ~  3/09 restarted GLIMEPIRIDE 4mg - 1/2 tab each AM, & continue Lantus & Metformin... ~  labs 4/09 showed BS= 148, HgA1c= 7.3.Marland Kitchen. rec> back on 4 med regimen due to poor control at home. ~  6/09 discussed titrating the Lantus up until FBS 100-120 range... ~  labs 2/10 showed BS= 164, HgA1c= 8.6.Marland KitchenMarland Kitchen very disappointing- needs home BS monitoring, incr Lantus. ~  labs 2/11 showed BS= 298, A1c= 8.4...  discussed change Lantus to LEVEMIR 40 u daily... ~  labs 8/11 showed BS= 202, A1c= 10.9.Marland Kitchen. rec> incr Levemir to 50, consider Humalog. ~  Nov-Dec 2011:  BS up w/ adjust of meds & addition of Pred after hosp 11/11 & hypercalcemia... ~  She states she returned to Levemir 40u daily after the splenectomy hosp due to appetite etc;  4/12 BS=117 ~  Labs 7/12 showed BS= 167, A1c= 8.4> on Levemir 50u/d & Amaryl 4mg  Qam (note creat 1.9). ~  8-10/12: on Levemir30 + ApidraSS per DrA & BS are 150-400+ due to the Pred20; A1c 8/12 was 7.6 ~  11/12:  DrA has increased her Levemir to 46u/d & the Apidra to 10+SS cover Tid... BS here= 168 ~  1/13: Covered w/ SSI in Hosp but A1c=9.3 (Creat up to 2.4 but improved to 1.7 w/ adjust meds)... ~  [name]'s notes indicates continued adjustments in her insulin regimen w/ Levemir & Apidra...  OBESITY (ICD-278.00) - weight down to 171 after hosp 11/11- doing better on diet, not yet exercising. ~  weight 220-230# in the early 1990's... ~  weight 210-220# in the early 2000's... ~  weight 2/10 = 192# ~  weight 2/11 = 186# ~  weight 8/11 = 174# ~  weight 12/11 = 171# ~  Weight 4/12 = 168# ~  Weight 7/12 = 171# ~  Weight 10/12 = 180# ~  Weight 11/12 = 188# ~  Weight 1/13 = 196# ~  Weight 3/13 = 205#... She MUST get back on diet, incr exercise, get wt down. ~  Weight 6/13 = 205# ~  Weight 8/13 = 210# ~  Weight 9/13 = 220#... What happened? ~  Weight 11/13 = 225# and BMI= 40 ~  Weight 3/14 = 203#  HYPERCALCEMIA>  this was most likely due to sarcoidosis w/ extensive splenic involvement;  Calcium was as hight as 13.7 and returned to normal after the splenectomy. ~  Labs 4/12 showed calcium = 11.1 & this is very disappointing... ~  Labs 7/12 showed Ca=  12.0, Phos= 4.0, ACE= 78 ~  Labs 8-10/12 showed Ca up to 13.4, then down to 10.3 on PRED20mg /d... ~  Labs 11/12 showed Ca= 9.8 on Pred 20-10 qod; we will continue to wean. ~  Labs 1/13 in [place] showed Ca= 9.7 range on Pred 10-5 Qod;  We decided to wean Pred to 5mg /d... ~  Labs 3/13 showed Ca=10.1 on Pred5mg /d; rec to decr to 5mg Qod... ~  Labs 6/13 showed Ca=11.0 on Pred5Qod; rec incr to 10mg /d... ~  Labs 8/13 showed Ca=10.0 on Pred10/d; rec to decr to 5mg /d... ~  Labs 9/13 showed Ca=9.1 on Pred5mg /d; rec to decr to 5mg Qod... ~  Labs 12/13 showed Ca= 9.2 to 10.5 on Pred 5mg Qod... ~  Labs 3/14 in hosp showed Ca= 14.5 & disch on Pred20Bid; 3/14 f/u Ca=10.6 & Pred decr to 20mg /d... ~  4/14: on Pred20/d and f/u labs showed Ca= 9.4... Rec to decr Pred to 10mg /d & f/u 79mo...  GERD - on NEXIUM 40mg  Bid... w/ increased reflux symptoms w/ noct cough etc...  ~  2/10: discussed optimal Rx w/ Nex before dinner, Zantac300 + Reglan10 at bed, elev HOB, etc. ~  2/11: she stopped the Reglan, still using Nexium/ Zantac but w/ persist symptoms>  refer to GI for eval. ~  6/11: GI f/u DrPerry w/ EGD that was normal... continue Rx. ~  12/11:  increased GI symptoms post hosp> she will f/u w/ DrPerry for his input> incr Nexium Bid.  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 ~  6/11:  f/u colonoscopy by DrPerry showed divertics, 4 polyps, AVM.Marland KitchenMarland Kitchen path= tubular adenoma, f/u 24yrs.  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 ~  Note:  Serum Calcium ret to normal immediately after spleen removed...  RENAL INSUFFICIENCY (ICD-588.9) >> Creat ~ 2.0 & eval 10/11 by DrFox- prob due to DM & HBP... ~  Labs 4/12 showed BUN= 27, Creat= 1.6 ~  Labs 7/12 showed BUN= 44, Creat= 1.9 (not on diuretics/ NSAIDs/ etc)... ~  Labs showed Creat up to 2.3 when Ca=13.4; now improved to 1.8 w/ Ca coming down... ~  Labs 11/12 showed BUN=38, Creat=1.7; rec to maintain hydration... ~  Labs 1/13 in [place] w/ diuresis showed Creat incr to 2.4 but ret to 1.7 w/ adjustment in meds... ~  Labs 3/13 showed BUN=38, Creat= 1.6 ~  Labs 6/13 showed BUN=41, Creat= 2.3; rec> stop Lasix20 for now, liberalize fluids. ~  Labs 9/13 showed BUN=40, Creat=2.1 on Lasix20, rec to continue the same. ~  Labs 12/13 showed Creat= 1.5 ~  Labs 3/14 in hosp showed Creat=4, then decr to 2.00 by dischage... ~  Labs 4/14 showed BUN=51, Creat=2.1, and rec to incr free water as we wean the Pred...  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 on LYRICA 100mg  Qhs, off prev Tramadol Rx... ~  2010 eval by DrHiatt @ [facility] on Tramadol 50mg , Lyrica 75mg Bid...  GOUT, UNSPECIFIED (ICD-274.9) - Uric in the 7-8 range...on ALLOPURINOL 300mg /d for prevention...  ANXIETY (ICD-300.00) - she is under mod stress- work, mother died, hospice counselling, etc... ~  8/11:  rec starting Alprazolam 0.5mg  Tid for palpit, SOB, anxiety...  ANEMIA-NOS & MGUS >> see eval 10-11/11 by DrGranfortuna w/ bone marrow 11/11 in hosp= neg. ~  Labs 4/12  showed Hg= 11.6, MCV= 84... On FeSO4 daily. ~  Labs 7/12 showed Hg= 12.8, MCV= 90, Fe= 45 (14%sat)... ~  Labs 1/13 in [place] showed Hg=10.4 but improved to 12.9 on post hosp check in office... ~  Labs 6/13 showed Hg= 12.4 ~  Labs 12/13 showed Hg= 13.9 ~  Labs 3/14 showed Hg= 12.9  PSORIASIS - eval and Rx per DrTafeen prev on Humira injections- stopped 9/11...   Past Surgical History  Procedure  Laterality Date  . Cholecystectomy    . Vesicovaginal fistula closure w/  total abdominal hysterectomy    . Bilateral total knee replacements  Rt=5/04 & Lft=1/09    by DrAlusio  . Tonsillectomy    . Appendectomy    . Abdominal hysterectomy    . Breast reduction surgery  1982  . Open splenectomy  [id number]    by DrRosenbower    Outpatient Encounter Prescriptions as of 12/19/2012  Medication Sig Dispense Refill  . albuterol (PROVENTIL HFA;VENTOLIN HFA) 108 (90 BASE) MCG/ACT inhaler Inhale 2 puffs into the lungs every 6 (six) hours as needed. For shortness of breath.  8.5 g  5  . albuterol (PROVENTIL) (2.5 MG/3ML) 0.083% nebulizer solution Take 2.5 mg by nebulization 4 (four) times daily. Shortness of breath      . allopurinol (ZYLOPRIM) 100 MG tablet take 1 tablet by mouth once daily  30 tablet  11  . amLODipine (NORVASC) 10 MG tablet take 1 tablet by mouth once daily  30 tablet  PRN  . aspirin EC 81 MG tablet Take 81 mg by mouth daily.      Marland Kitchen atorvastatin (LIPITOR) 20 MG tablet take 1 tablet by mouth once daily  30 tablet  11  . cloNIDine (CATAPRES) 0.1 MG tablet Take 0.1 mg by mouth 2 (two) times daily.      Marland Kitchen Dextromethorphan-Guaifenesin (TUSSIN DM) 10-100 MG/5ML liquid Take 5 mLs by mouth daily as needed (for cough).      . esomeprazole (NEXIUM) 40 MG capsule Take 1 capsule (40 mg total) by mouth 2 (two) times daily.  90 capsule  3  . ferrous sulfate 325 (65 FE) MG tablet Take 325 mg by mouth daily with breakfast.      . fluticasone (FLONASE) 50 MCG/ACT nasal spray Place 2 sprays into the nose at bedtime.  48 g  3  . Fluticasone-Salmeterol (ADVAIR) 250-50 MCG/DOSE AEPB Inhale 1 puff into the lungs 2 (two) times daily.      . furosemide (LASIX) 40 MG tablet TKAE 1/2 TABLET BY MOUTH DAILY  90 tablet  3  . glucose blood test strip 1 each by Other route as needed for other. Use as instructed      . guaiFENesin (MUCINEX) 600 MG 12 hr tablet Take 1,200 mg by mouth daily.      . hydrALAZINE  (APRESOLINE) 10 MG tablet Take 10 mg by mouth 3 (three) times daily.      . insulin detemir (LEVEMIR) 100 UNIT/ML injection Inject 70 Units into the skin every morning.       . insulin glulisine (APIDRA SOLOSTAR) 100 UNIT/ML injection Use 18  units before breakfast and 30 units before lunch and 26  units before dinner      . ketorolac (ACULAR LS) 0.4 % SOLN Place 1 drop into both eyes 3 (three) times daily.       . Lancets MISC 1 Units by Does not apply route 4 (four) times daily - after meals and at bedtime.      Marland Kitchen  LORazepam (ATIVAN) 1 MG tablet Take 1 mg by mouth daily as needed. For anxiety.      . metoprolol tartrate (LOPRESSOR) 25 MG tablet Take 12.5 mg by mouth daily.      Marland Kitchen NEXIUM 40 MG capsule TAKE 1 CAPSULE BY MOUTH 2 TIMES DAILY BEFORE MEAL  60 capsule  11  . predniSONE (DELTASONE) 20 MG tablet Take 0.5 tablets (10 mg total) by mouth daily.  30 tablet  2  . pregabalin (LYRICA) 100 MG capsule Take 1 capsule (100 mg total) by mouth every evening.  30 capsule  5  . promethazine (PHENERGAN) 25 MG tablet Take 1 tablet (25 mg total) by mouth every 6 (six) hours as needed for nausea.  20 tablet  0  . sennosides-docusate sodium (SENOKOT-S) 8.6-50 MG tablet Take 1 tablet by mouth at bedtime.      . [DISCONTINUED] esomeprazole (NEXIUM) 40 MG capsule Take 40 mg by mouth 2 (two) times daily.      . [DISCONTINUED] amLODipine (NORVASC) 10 MG tablet Take 10 mg by mouth daily.      . [DISCONTINUED] atorvastatin (LIPITOR) 20 MG tablet Take 20 mg by mouth daily.       No facility-administered encounter medications on file as of 12/19/2012.    Allergies  Allergen Reactions  . Adhesive (Tape) Other (See Comments)    Blisters   . Avelox (Moxifloxacin Hcl In Nacl)     GI upset  . Codeine Other (See Comments)    "crazy"   . Guaifenesin Nausea And Vomiting  . Latex Swelling  . Oxycodone Nausea And Vomiting    Current Medications, Allergies, Past Medical History, Past Surgical History, Family  History, and Social History were reviewed in [employer] record.   Review of Systems         See HPI - all other systems neg except as noted... The patient complains of dyspnea on exertion.  The patient denies anorexia, fever, weight loss, weight gain, vision loss, decreased hearing, hoarseness, chest pain, syncope, peripheral edema, prolonged cough, headaches, hemoptysis, abdominal pain, melena, hematochezia, severe indigestion/heartburn, hematuria, incontinence, muscle weakness, suspicious skin lesions, transient blindness, difficulty walking, depression, unusual weight change, abnormal bleeding, enlarged lymph nodes, and angioedema.     Objective:   Physical Exam      WD, Overweight, 66 y/o BF in NAD...  GENERAL:  Alert & oriented; pleasant & cooperative... HEENT:  [place]/AT, EOM-wnl, PERRLA, EACs-clear, TMs-wnl, NOSE-clear, THROAT- clear & wnl... NECK:  Supple w/ fair ROM; no JVD; normal carotid impulses w/o bruits; palp thyroid, w/o nodules felt; no lymphadenopathy. CHEST:  Clear to P & A; without wheezes/ rales/ or rhonchi. HEART:  Regular Rhythm; gr 1/6 SEM, without rubs/ or gallops. ABDOMEN:  Scar from splenectomy surg; obese, soft, & nontender w/ panniculus; normal bowel sounds; no organomegaly or masses detected EXT: without deformities, mod arthritic changes, s/p bilat TKRs; no varicose veins/ +venous insuffic/ tr edema. NEURO:  CN's intact; no focal neuro deficits... DERM:  Psoriasis rash per DrTafeen...  RADIOLOGY DATA:  Reviewed in the EPIC EMR & discussed w/ the patient...  LABORATORY DATA:  Reviewed in the EPIC EMR & discussed w/ the patient...   Assessment & Plan:    SARCOIDOSIS>  Main manifestation has been her hypercalcemia> she has proven that when her Pred is weaned to 5mg Qod the hypercalcemia returns; we are trying to determine her lowest dose threshold & Ca=10.6 on Pred20Bid post hosp 3/14, weaned to Pred20mg /d & labs 4/14 showed Ca=  9.4,  therefore wean further to Pred10mg /d...  Hypercalcemia>  Initially responded to splenectomy but her calcium level rose again over several months after the surg; we were forced to use Pred rx in this difficult diabetic; we had her see [name] for DM/ endocrine consult; calcium levels have responded nicely to Pred Rx but climbed back up when Pred was weaned to 5mg Qod (see above)...    ASTHMATIC BRONCHITS>  ER 11/13 bumped her Pred to 40mg /d which negated our slow taper w/ monitoring of her Calcium levels; NEB Rx w/ albut seemed to give the the max benefit & we need to wean down the Pred as much as poss; she is also hypoxemic w/ O2sat=84% on RA (likely from V/Q mismatching);  We decided to Rx w/ HomeO2- 1L/min rest, 2L/min exercise for now; continue home meds + add NEBS Qid => continue same...  HBP>  On same 5 med regimen & BP is fair; she is sched to see Cards soon & we will wait & see if they adjust meds... 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  DM>  Control has been difficult;  Stressed diet/ exercise, & get wt down; DrA has her on Levemir & ApidraSS coverage...  Renal Insuffic>  Creat was up to 4 in [place] & then back to 2.0 w/ Lasix at 20mg /d; continue same...  Other medical problems as noted...   Patient's Medications  New Prescriptions   No medications on file  Previous Medications   ALBUTEROL (PROVENTIL HFA;VENTOLIN HFA) 108 (90 BASE) MCG/ACT INHALER    Inhale 2 puffs into the lungs every 6 (six) hours as needed. For shortness of breath.   ALBUTEROL (PROVENTIL) (2.5 MG/3ML) 0.083% NEBULIZER SOLUTION    Take 2.5 mg by nebulization 4 (four) times daily. Shortness of breath   ALLOPURINOL (ZYLOPRIM) 100 MG TABLET    take 1 tablet by mouth once daily   AMLODIPINE (NORVASC) 10  MG TABLET    take 1 tablet by mouth once daily   ASPIRIN EC 81 MG TABLET    Take 81 mg by mouth daily.   ATORVASTATIN (LIPITOR) 20 MG TABLET    take 1 tablet by mouth once daily   CLONIDINE (CATAPRES) 0.1 MG TABLET    Take 0.1 mg by mouth 2 (two) times daily.   DEXTROMETHORPHAN-GUAIFENESIN (TUSSIN DM) 10-100 MG/5ML LIQUID    Take 5 mLs by mouth daily as needed (for cough).   FERROUS SULFATE 325 (65 FE) MG TABLET    Take 325 mg by mouth daily with breakfast.   FLUTICASONE (FLONASE) 50 MCG/ACT NASAL SPRAY    Place 2 sprays into the nose at bedtime.   FLUTICASONE-SALMETEROL (ADVAIR) 250-50 MCG/DOSE AEPB    Inhale 1 puff into the lungs 2 (two) times daily.   FUROSEMIDE (LASIX) 40 MG TABLET    TKAE 1/2 TABLET BY MOUTH DAILY   GLUCOSE BLOOD TEST STRIP    1 each by Other route as needed for other. Use as instructed   GUAIFENESIN (MUCINEX) 600 MG 12 HR TABLET    Take 1,200 mg by mouth daily.   HYDRALAZINE (APRESOLINE) 10 MG TABLET    Take 10 mg by mouth 3 (three) times daily.   INSULIN DETEMIR (LEVEMIR) 100 UNIT/ML INJECTION  Inject 70 Units into the skin every morning.    INSULIN GLULISINE (APIDRA SOLOSTAR) 100 UNIT/ML INJECTION    Use 18  units before breakfast and 30 units before lunch and 26  units before dinner   KETOROLAC (ACULAR LS) 0.4 % SOLN    Place 1 drop into both eyes 3 (three) times daily.    LANCETS MISC    1 Units by Does not apply route 4 (four) times daily - after meals and at bedtime.   LORAZEPAM (ATIVAN) 1 MG TABLET    Take 1 mg by mouth daily as needed. For anxiety.   METOPROLOL TARTRATE (LOPRESSOR) 25 MG TABLET    Take 12.5 mg by mouth daily.   NEXIUM 40 MG CAPSULE    TAKE 1 CAPSULE BY MOUTH 2 TIMES DAILY BEFORE MEAL   PREDNISONE (DELTASONE) 20 MG TABLET    Take 0.5 tablets (10 mg total) by mouth daily.   PREGABALIN (LYRICA) 100 MG CAPSULE    Take 1 capsule (100 mg total) by mouth every evening.   PROMETHAZINE (PHENERGAN) 25 MG TABLET    Take 1 tablet (25 mg total) by mouth every  6 (six) hours as needed for nausea.   SENNOSIDES-DOCUSATE SODIUM (SENOKOT-S) 8.6-50 MG TABLET    Take 1 tablet by mouth at bedtime.  Modified Medications   Modified Medication Previous Medication   ESOMEPRAZOLE (NEXIUM) 40 MG CAPSULE esomeprazole (NEXIUM) 40 MG capsule      Take 1 capsule (40 mg total) by mouth 2 (two) times daily.    Take 40 mg by mouth 2 (two) times daily.  Discontinued Medications   AMLODIPINE (NORVASC) 10 MG TABLET    Take 10 mg by mouth daily.   ATORVASTATIN (LIPITOR) 20 MG TABLET    Take 20 mg by mouth daily.

## 2012-12-20 ENCOUNTER — Ambulatory Visit (INDEPENDENT_AMBULATORY_CARE_PROVIDER_SITE_OTHER): Payer: BC Managed Care – PPO | Admitting: Internal Medicine

## 2012-12-20 ENCOUNTER — Encounter: Payer: Self-pay | Admitting: Internal Medicine

## 2012-12-20 VITALS — BP 167/80 | HR 60 | Ht 63.0 in | Wt 207.6 lb

## 2012-12-20 DIAGNOSIS — I471 Supraventricular tachycardia: Secondary | ICD-10-CM

## 2012-12-20 DIAGNOSIS — I498 Other specified cardiac arrhythmias: Secondary | ICD-10-CM

## 2012-12-20 NOTE — Patient Instructions (Signed)
Your physician recommends that you schedule a follow-up appointment as needed  

## 2012-12-20 NOTE — Progress Notes (Signed)
Primary Care Physician: Michele Mcalpine, MD Referring Physician:  Dr Saundra Shelling is a 67 y.o. female with a h/o sarcoidosis who presents for EP consultation.  She was hospitalized with hypercalcemia.  During her hospital stay, she had a wide complex tachycardia observed on telemetry.  I have subsequently reviewed this strip which revealed atrial tachycardia.  She was asymptomatic with the episode and was discharged.  She has done well from a CV standpoint since that time.  Today, she denies symptoms of palpitations, chest pain, shortness of breath, orthopnea, PND, lower extremity edema, dizziness, presyncope, syncope, or neurologic sequela. The patient is tolerating medications without difficulties and is otherwise without complaint today.   Past Medical History  Diagnosis Date  . Unspecified essential hypertension   . CAD (coronary artery disease) 01/04/2006    Tiny OM1 70-90% ostial stenosis, no other CAD  . Asthma   . OSA (obstructive sleep apnea)     marginally compliant with CPAP  . Esophageal reflux   . Obesity, unspecified   . Degenerative joint disease   . Anxiety state, unspecified   . Hypercalcemia   . Anemia   . Other psoriasis   . Diverticulosis   . Sarcoidosis 2012  . Diabetes mellitus   . Kidney disease     baseline creatinie 2.0,  folowed by Dr Caryn Section  . Dyslipidemia   . PNA (pneumonia)     With pleural effusion, requiring thoracentesis  . COPD (chronic obstructive pulmonary disease)   . Gout    Past Surgical History  Procedure Laterality Date  . Cholecystectomy    . Vesicovaginal fistula closure w/  total abdominal hysterectomy    . Bilateral total knee replacements  Rt=5/04 & Lft=1/09    by DrAlusio  . Tonsillectomy    . Appendectomy    . Abdominal hysterectomy    . Breast reduction surgery  1982  . Open splenectomy  [id number]    by DrRosenbower    Current Outpatient Prescriptions  Medication Sig Dispense Refill  . albuterol (PROVENTIL  HFA;VENTOLIN HFA) 108 (90 BASE) MCG/ACT inhaler Inhale 2 puffs into the lungs every 6 (six) hours as needed. For shortness of breath.  8.5 g  5  . albuterol (PROVENTIL) (2.5 MG/3ML) 0.083% nebulizer solution Take 2.5 mg by nebulization 4 (four) times daily. Shortness of breath      . allopurinol (ZYLOPRIM) 100 MG tablet take 1 tablet by mouth once daily  30 tablet  11  . amLODipine (NORVASC) 10 MG tablet take 1 tablet by mouth once daily  30 tablet  PRN  . aspirin EC 81 MG tablet Take 81 mg by mouth daily.      Marland Kitchen atorvastatin (LIPITOR) 20 MG tablet take 1 tablet by mouth once daily  30 tablet  11  . cloNIDine (CATAPRES) 0.1 MG tablet Take 0.1 mg by mouth 2 (two) times daily.      Marland Kitchen Dextromethorphan-Guaifenesin (TUSSIN DM) 10-100 MG/5ML liquid Take 5 mLs by mouth daily as needed (for cough).      . esomeprazole (NEXIUM) 40 MG capsule Take 1 capsule (40 mg total) by mouth 2 (two) times daily.  90 capsule  3  . ferrous sulfate 325 (65 FE) MG tablet Take 325 mg by mouth daily with breakfast.      . fluticasone (FLONASE) 50 MCG/ACT nasal spray Place 2 sprays into the nose at bedtime.  48 g  3  . Fluticasone-Salmeterol (ADVAIR) 250-50 MCG/DOSE AEPB Inhale 1 puff into  the lungs 2 (two) times daily.      . furosemide (LASIX) 40 MG tablet TKAE 1/2 TABLET BY MOUTH DAILY  90 tablet  3  . glucose blood test strip 1 each by Other route as needed for other. Use as instructed      . guaiFENesin (MUCINEX) 600 MG 12 hr tablet Take 1,200 mg by mouth daily.      . hydrALAZINE (APRESOLINE) 10 MG tablet Take 10 mg by mouth 3 (three) times daily.      . insulin detemir (LEVEMIR) 100 UNIT/ML injection Inject 70 Units into the skin every morning.       . insulin glulisine (APIDRA SOLOSTAR) 100 UNIT/ML injection Use 18  units before breakfast and 30 units before lunch and 26  units before dinner      . ketorolac (ACULAR LS) 0.4 % SOLN Place 1 drop into both eyes 3 (three) times daily.       . Lancets MISC 1 Units by Does  not apply route 4 (four) times daily - after meals and at bedtime.      Marland Kitchen LORazepam (ATIVAN) 1 MG tablet Take 1 mg by mouth daily as needed. For anxiety.      . metoprolol tartrate (LOPRESSOR) 25 MG tablet Take 12.5 mg by mouth daily.      Marland Kitchen NEXIUM 40 MG capsule TAKE 1 CAPSULE BY MOUTH 2 TIMES DAILY BEFORE MEAL  60 capsule  11  . predniSONE (DELTASONE) 20 MG tablet Take 0.5 tablets (10 mg total) by mouth daily.  30 tablet  2  . pregabalin (LYRICA) 100 MG capsule Take 1 capsule (100 mg total) by mouth every evening.  30 capsule  5  . promethazine (PHENERGAN) 25 MG tablet Take 1 tablet (25 mg total) by mouth every 6 (six) hours as needed for nausea.  20 tablet  0  . sennosides-docusate sodium (SENOKOT-S) 8.6-50 MG tablet Take 1 tablet by mouth at bedtime.       No current facility-administered medications for this visit.    Allergies  Allergen Reactions  . Adhesive (Tape) Other (See Comments)    Blisters   . Avelox (Moxifloxacin Hcl In Nacl)     GI upset  . Codeine Other (See Comments)    "crazy"   . Guaifenesin Nausea And Vomiting  . Latex Swelling  . Oxycodone Nausea And Vomiting    History   Social History  . Marital Status: Single    Spouse Name: N/A    Number of Children: 0  . Years of Education: N/A   Occupational History  . Retired   . SECRETARY    Social History Main Topics  . Smoking status: Never Smoker   . Smokeless tobacco: Never Used  . Alcohol Use: No  . Drug Use: No  . Sexually Active: Not on file   Other Topics Concern  . Not on file   Social History Narrative   Lives in [place] alone.  Never married.   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 Diabetes Mother   . Breast cancer Maternal Aunt     ROS- All systems are reviewed and negative except as per the HPI above  Physical Exam: Filed Vitals:   12/20/12 1429  BP: 167/80  Pulse: 60  Height: 5\' 3"  (1.6 m)  Weight: 207 lb 9.6 oz  (94.167 kg)    GEN- The patient is well appearing, alert and oriented x  3 today.   Head- normocephalic, atraumatic Eyes-  Sclera clear, conjunctiva pink Ears- hearing intact Oropharynx- clear Neck- supple, Lungs- Clear to ausculation bilaterally, normal work of breathing Heart- Regular rate and rhythm, no murmurs, rubs or gallops, PMI not laterally displaced GI- soft, NT, ND, + BS Extremities- no clubbing, cyanosis, or edema MS- no significant deformity or atrophy Skin- no rash or lesion Psych- euthymic mood, full affect Neuro- strength and sensation are intact  EKG reviewed myoview and echo are reviewed  Assessment and Plan:  1. Atrial tachycardia Asymptomatic, without recurrence No further workup planned  Follow-up with Dr Myrtis Ser I will see as needed

## 2012-12-26 ENCOUNTER — Other Ambulatory Visit: Payer: Self-pay | Admitting: Pulmonary Disease

## 2012-12-26 DIAGNOSIS — I471 Supraventricular tachycardia: Secondary | ICD-10-CM | POA: Insufficient documentation

## 2013-01-24 ENCOUNTER — Telehealth: Payer: Self-pay | Admitting: Pulmonary Disease

## 2013-01-24 MED ORDER — PREDNISONE 10 MG PO TABS: 10.0000 mg | ORAL_TABLET | Freq: Every day | ORAL | Status: DC

## 2013-01-24 NOTE — Telephone Encounter (Signed)
Spoke with the pt to verify the msg Rx was sent to pharm for 10 mf pred Nothing further needed per pt

## 2013-02-06 ENCOUNTER — Other Ambulatory Visit: Payer: Self-pay | Admitting: Pulmonary Disease

## 2013-02-08 ENCOUNTER — Encounter: Payer: Self-pay | Admitting: Cardiology

## 2013-02-08 ENCOUNTER — Ambulatory Visit (INDEPENDENT_AMBULATORY_CARE_PROVIDER_SITE_OTHER): Payer: BC Managed Care – PPO | Admitting: Cardiology

## 2013-02-08 VITALS — BP 172/70 | HR 75 | Ht 63.0 in | Wt 215.0 lb

## 2013-02-08 DIAGNOSIS — I5032 Chronic diastolic (congestive) heart failure: Secondary | ICD-10-CM

## 2013-02-08 DIAGNOSIS — I472 Ventricular tachycardia: Secondary | ICD-10-CM

## 2013-02-08 DIAGNOSIS — G4730 Sleep apnea, unspecified: Secondary | ICD-10-CM

## 2013-02-08 DIAGNOSIS — I498 Other specified cardiac arrhythmias: Secondary | ICD-10-CM

## 2013-02-08 DIAGNOSIS — I2510 Atherosclerotic heart disease of native coronary artery without angina pectoris: Secondary | ICD-10-CM

## 2013-02-08 DIAGNOSIS — I471 Supraventricular tachycardia: Secondary | ICD-10-CM

## 2013-02-08 DIAGNOSIS — I679 Cerebrovascular disease, unspecified: Secondary | ICD-10-CM

## 2013-02-08 DIAGNOSIS — E7800 Pure hypercholesterolemia, unspecified: Secondary | ICD-10-CM

## 2013-02-08 DIAGNOSIS — R011 Cardiac murmur, unspecified: Secondary | ICD-10-CM

## 2013-02-08 DIAGNOSIS — N259 Disorder resulting from impaired renal tubular function, unspecified: Secondary | ICD-10-CM

## 2013-02-08 NOTE — Progress Notes (Signed)
HPI [name] returns today for evaluation and management of her chronic diastolic heart failure, history of nonobstructive coronary disease in a small OM branch, mild mitral regurgitation, history of nonsustained ventricular tachycardia in the setting of hyperkalemia, history of atrial tachycardia.  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 She is still on steroids for psoriasis as well. This causes a fair amount of puffiness and swelling in her legs. She denies orthopnea, PND. She's had no chest pain or angina.  Past Medical History  Diagnosis Date  . Unspecified essential hypertension   . CAD (coronary artery disease) 01/04/2006    Tiny OM1 70-90% ostial stenosis, no other CAD  . Asthma   . OSA (obstructive sleep apnea)     marginally compliant with CPAP  . Esophageal reflux   . Obesity, unspecified   . Degenerative joint disease   . Anxiety state, unspecified   . Hypercalcemia   . Anemia   . Other psoriasis   . Diverticulosis   . Sarcoidosis 2012  . Diabetes mellitus   . Kidney disease     baseline creatinie 2.0,  folowed by Dr Caryn Section  . Dyslipidemia   . PNA (pneumonia)     With pleural effusion, requiring thoracentesis  . COPD (chronic obstructive pulmonary disease)   . Gout     Current Outpatient Prescriptions  Medication Sig Dispense Refill  . albuterol (PROVENTIL HFA;VENTOLIN HFA) 108 (90 BASE) MCG/ACT inhaler Inhale 2 puffs into the lungs every 6 (six) hours as needed. For shortness of breath.  8.5 g  5  . albuterol (PROVENTIL) (2.5 MG/3ML) 0.083% nebulizer solution Take 2.5 mg by nebulization 4 (four) times daily. Shortness of breath      . allopurinol (ZYLOPRIM) 100 MG tablet take 1 tablet by mouth once daily  30 tablet  11  . amLODipine (NORVASC) 10 MG tablet take 1 tablet by mouth once daily  30 tablet  PRN  . aspirin EC 81 MG tablet Take 81 mg by mouth daily.      Marland Kitchen atorvastatin (LIPITOR) 20 MG tablet take 1  tablet by mouth once daily  30 tablet  11  . cloNIDine (CATAPRES) 0.1 MG tablet take 1 tablet by mouth twice a day  60 tablet  5  . Dextromethorphan-Guaifenesin (TUSSIN DM) 10-100 MG/5ML liquid Take 5 mLs by mouth daily as needed (for cough).      . esomeprazole (NEXIUM) 40 MG capsule Take 1 capsule (40 mg total) by mouth 2 (two) times daily.  90 capsule  3  . ferrous sulfate 325 (65 FE) MG tablet Take 325 mg by mouth daily with breakfast.      . fluticasone (FLONASE) 50 MCG/ACT nasal spray Place 2 sprays into the nose at bedtime.  48 g  3  . Fluticasone-Salmeterol (ADVAIR) 250-50 MCG/DOSE AEPB Inhale 1 puff into the lungs 2 (two) times daily.      . furosemide (LASIX) 40 MG tablet TKAE 1/2 TABLET BY MOUTH DAILY  90 tablet  3  . glucose blood test strip 1 each by Other route as needed for other. Use as instructed      . guaiFENesin (MUCINEX) 600 MG 12 hr tablet Take 1,200 mg by mouth daily.      . hydrALAZINE (APRESOLINE) 10 MG tablet take 1 tablet by mouth three times a day  90 tablet  5  . insulin detemir (LEVEMIR) 100 UNIT/ML injection Inject 70 Units into the skin every morning.       Marland Kitchen  insulin glulisine (APIDRA SOLOSTAR) 100 UNIT/ML injection Use 18  units before breakfast and 30 units before lunch and 26  units before dinner      . ketorolac (ACULAR LS) 0.4 % SOLN Place 1 drop into both eyes 3 (three) times daily.       . Lancets MISC 1 Units by Does not apply route 4 (four) times daily - after meals and at bedtime.      Marland Kitchen LORazepam (ATIVAN) 1 MG tablet Take 1 mg by mouth daily as needed. For anxiety.      . metoprolol tartrate (LOPRESSOR) 25 MG tablet Take 12.5 mg by mouth daily.      . predniSONE (DELTASONE) 10 MG tablet Take 1 tablet (10 mg total) by mouth daily.  30 tablet  2  . pregabalin (LYRICA) 100 MG capsule Take 1 capsule (100 mg total) by mouth every evening.  30 capsule  5  . promethazine (PHENERGAN) 25 MG tablet Take 1 tablet (25 mg total) by mouth every 6 (six) hours as needed  for nausea.  20 tablet  0  . sennosides-docusate sodium (SENOKOT-S) 8.6-50 MG tablet Take 1 tablet by mouth at bedtime.       No current facility-administered medications for this visit.    Allergies  Allergen Reactions  . Adhesive (Tape) Other (See Comments)    Blisters   . Avelox (Moxifloxacin Hcl In Nacl)     GI upset  . Codeine Other (See Comments)    "crazy"   . Guaifenesin Nausea And Vomiting  . Latex Swelling  . Oxycodone Nausea And Vomiting    Family History  Problem Relation Age of Onset  . Diabetes Mother   . Breast cancer Maternal Aunt     History   Social History  . Marital Status: Single    Spouse Name: N/A    Number of Children: 0  . Years of Education: N/A   Occupational History  . Retired   . SECRETARY    Social History Main Topics  . Smoking status: Never Smoker   . Smokeless tobacco: Never Used  . Alcohol Use: No  . Drug Use: No  . Sexually Active: Not on file   Other Topics Concern  . Not on file   Social History Narrative   Lives in [place] alone.  Never married.   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[employer]    ROS ALL NEGATIVE EXCEPT THOSE NOTED IN HPI  PE  General Appearance: well developed, well nourished in no acute distress, cushingoid appearance HEENT: symmetrical face, PERRLA, good dentition  Neck: no JVD, thyromegaly, or adenopathy, trachea midline Chest: symmetric without deformity Cardiac: PMI DIFFICULT TO APPRECIATE, RRR, normal S1, S2, no gallop or murmur Lung: clear to ausculation and percussion Vascular: all pulses full without bruits  Abdominal: nondistended, nontender, good bowel sounds, no HSM, no bruits Extremities: no cyanosis, clubbing, 2+ pedal edema, chronic skin changes. no sign of DVT, no varicosities  Skin: normal color, no rashes Neuro: alert and oriented x 3, non-focal Pysch: normal affect  EKG  BMET    Component Value Date/Time   NA 135 11/08/2012 1558   K 5.1 11/08/2012 1558   CL 102 11/08/2012 1558    CO2 26 11/08/2012 1558   GLUCOSE 133* 11/08/2012 1558   BUN 51* 11/08/2012 1558   CREATININE 2.1* 11/08/2012 1558   CALCIUM 9.4 11/08/2012 1558   CALCIUM 12.8* 10/17/2012 1734   GFRNONAA 25* 10/22/2012 0450   GFRAA 29* 10/22/2012 0450  Lipid Panel     Component Value Date/Time   CHOL 146 11/24/2010 1339   TRIG 115.0 11/24/2010 1339   HDL 35.30* 11/24/2010 1339   CHOLHDL 4 11/24/2010 1339   VLDL 23.0 11/24/2010 1339   LDLCALC 88 11/24/2010 1339    CBC    Component Value Date/Time   WBC 10.0 10/18/2012 0555   WBC 4.6 07/17/2010 1551   RBC 4.38 10/18/2012 0555   RBC 3.71 07/17/2010 1551   HGB 13.4 10/18/2012 0555   HGB 10.5* 07/17/2010 1551   HCT 39.0 10/18/2012 0555   HCT 31.5* 07/17/2010 1551   PLT 486* 10/18/2012 0555   PLT 230 07/17/2010 1551   MCV 89.0 10/18/2012 0555   MCV 84.9 07/17/2010 1551   MCH 30.6 10/18/2012 0555   MCH 28.3 07/17/2010 1551   MCHC 34.4 10/18/2012 0555   MCHC 33.3 07/17/2010 1551   RDW 16.7* 10/18/2012 0555   RDW 14.8* 07/17/2010 1551   LYMPHSABS 2.1 10/17/2012 1110   LYMPHSABS 0.8* 07/17/2010 1551   MONOABS 1.8* 10/17/2012 1110   MONOABS 0.5 07/17/2010 1551   EOSABS 0.6 10/17/2012 1110   EOSABS 0.2 07/17/2010 1551   BASOSABS 0.0 10/17/2012 1110   BASOSABS 0.0 07/17/2010 1551

## 2013-02-08 NOTE — Assessment & Plan Note (Signed)
Stable without recurrence clinically. No further workup per Dr. Johney Frame.

## 2013-02-08 NOTE — Assessment & Plan Note (Addendum)
She has mild mitral regurgitation by echocardiography. Follow clinically.

## 2013-02-08 NOTE — Assessment & Plan Note (Signed)
Stable. No change in medical therapy. 

## 2013-02-08 NOTE — Patient Instructions (Addendum)
Your physician wants you to follow-up in: 1 year with Dr. Nishan You will receive a reminder letter in the mail two months in advance. If you don't receive a letter, please call our office to schedule the follow-up appointment.  Your physician recommends that you continue on your current medications as directed. Please refer to the Current Medication list given to you today.  

## 2013-02-08 NOTE — Assessment & Plan Note (Signed)
Stable. No change in current medical therapy. Return the office in a year to see Dr. Delton See.

## 2013-02-14 ENCOUNTER — Other Ambulatory Visit: Payer: Self-pay | Admitting: Pulmonary Disease

## 2013-02-14 MED ORDER — ESOMEPRAZOLE MAGNESIUM 40 MG PO CPDR: 40.0000 mg | DELAYED_RELEASE_CAPSULE | Freq: Two times a day (BID) | ORAL | Status: DC

## 2013-02-14 MED ORDER — HYDRALAZINE HCL 10 MG PO TABS: ORAL_TABLET | ORAL | Status: DC

## 2013-02-14 MED ORDER — PREGABALIN 100 MG PO CAPS: 100.0000 mg | ORAL_CAPSULE | Freq: Every evening | ORAL | Status: DC

## 2013-02-14 MED ORDER — ALBUTEROL SULFATE HFA 108 (90 BASE) MCG/ACT IN AERS: 2.0000 | INHALATION_SPRAY | Freq: Four times a day (QID) | RESPIRATORY_TRACT | Status: DC | PRN

## 2013-02-14 MED ORDER — PREDNISONE 10 MG PO TABS: 10.0000 mg | ORAL_TABLET | Freq: Every day | ORAL | Status: DC

## 2013-02-14 MED ORDER — FLUTICASONE PROPIONATE 50 MCG/ACT NA SUSP: 2.0000 | Freq: Every day | NASAL | Status: DC

## 2013-02-14 MED ORDER — ALBUTEROL SULFATE (2.5 MG/3ML) 0.083% IN NEBU: 2.5000 mg | INHALATION_SOLUTION | Freq: Four times a day (QID) | RESPIRATORY_TRACT | Status: DC

## 2013-02-14 MED ORDER — METOPROLOL TARTRATE 25 MG PO TABS: 12.5000 mg | ORAL_TABLET | Freq: Every day | ORAL | Status: DC

## 2013-02-14 MED ORDER — CLONIDINE HCL 0.1 MG PO TABS: ORAL_TABLET | ORAL | Status: DC

## 2013-02-14 MED ORDER — AMLODIPINE BESYLATE 10 MG PO TABS: ORAL_TABLET | ORAL | Status: DC

## 2013-02-14 MED ORDER — FLUTICASONE-SALMETEROL 250-50 MCG/DOSE IN AEPB: 1.0000 | INHALATION_SPRAY | Freq: Two times a day (BID) | RESPIRATORY_TRACT | Status: DC

## 2013-02-14 NOTE — Telephone Encounter (Signed)
rx have been faxed [facility] for [facility]ills.

## 2013-02-19 ENCOUNTER — Ambulatory Visit (INDEPENDENT_AMBULATORY_CARE_PROVIDER_SITE_OTHER): Payer: BC Managed Care – PPO | Admitting: Pulmonary Disease

## 2013-02-19 ENCOUNTER — Encounter: Payer: Self-pay | Admitting: Pulmonary Disease

## 2013-02-19 VITALS — BP 122/80 | HR 66 | Temp 97.4°F | Ht 63.0 in | Wt 214.4 lb

## 2013-02-19 DIAGNOSIS — I2510 Atherosclerotic heart disease of native coronary artery without angina pectoris: Secondary | ICD-10-CM

## 2013-02-19 DIAGNOSIS — I679 Cerebrovascular disease, unspecified: Secondary | ICD-10-CM

## 2013-02-19 DIAGNOSIS — E119 Type 2 diabetes mellitus without complications: Secondary | ICD-10-CM

## 2013-02-19 DIAGNOSIS — N259 Disorder resulting from impaired renal tubular function, unspecified: Secondary | ICD-10-CM

## 2013-02-19 DIAGNOSIS — G4733 Obstructive sleep apnea (adult) (pediatric): Secondary | ICD-10-CM

## 2013-02-19 DIAGNOSIS — F411 Generalized anxiety disorder: Secondary | ICD-10-CM

## 2013-02-19 DIAGNOSIS — E785 Hyperlipidemia, unspecified: Secondary | ICD-10-CM

## 2013-02-19 DIAGNOSIS — E669 Obesity, unspecified: Secondary | ICD-10-CM

## 2013-02-19 DIAGNOSIS — D869 Sarcoidosis, unspecified: Secondary | ICD-10-CM

## 2013-02-19 DIAGNOSIS — I10 Essential (primary) hypertension: Secondary | ICD-10-CM

## 2013-02-19 DIAGNOSIS — M1990 Unspecified osteoarthritis, unspecified site: Secondary | ICD-10-CM

## 2013-02-22 ENCOUNTER — Other Ambulatory Visit: Payer: Self-pay | Admitting: *Deleted

## 2013-02-22 MED ORDER — FUROSEMIDE 40 MG PO TABS: ORAL_TABLET | ORAL | Status: DC

## 2013-02-22 MED ORDER — ALLOPURINOL 100 MG PO TABS: ORAL_TABLET | ORAL | Status: DC

## 2013-02-22 MED ORDER — ATORVASTATIN CALCIUM 20 MG PO TABS: ORAL_TABLET | ORAL | Status: DC

## 2013-02-27 ENCOUNTER — Other Ambulatory Visit: Payer: Self-pay | Admitting: Pulmonary Disease

## 2013-03-01 ENCOUNTER — Telehealth: Payer: Self-pay | Admitting: Pulmonary Disease

## 2013-03-01 NOTE — Telephone Encounter (Signed)
Called and spoke with pt and she stated that [facility] has sent her a letter stating that in order for her to keep her oxygen, she will need to be tested in our office with a walk.  And this info will need to be sent back to them so they can submit this.  Pt will come in tomorrow afternoon after 2.  Pt is aware to bring her letter with her.

## 2013-03-02 ENCOUNTER — Ambulatory Visit (INDEPENDENT_AMBULATORY_CARE_PROVIDER_SITE_OTHER): Payer: Medicare Other

## 2013-03-02 DIAGNOSIS — J45909 Unspecified asthma, uncomplicated: Secondary | ICD-10-CM

## 2013-03-02 DIAGNOSIS — D869 Sarcoidosis, unspecified: Secondary | ICD-10-CM

## 2013-03-02 NOTE — Telephone Encounter (Signed)
SN is aware of pt coming in for the walk today.

## 2013-03-03 ENCOUNTER — Encounter: Payer: Self-pay | Admitting: Pulmonary Disease

## 2013-03-03 NOTE — Progress Notes (Signed)
Subjective:    Patient ID: [name], female    DOB: [birth year], 67 y.o.   MRN: [id number]  HPI 67 y/o BF here for a follow up visit... he has multiple medical problems as noted below...    ~  August 16, 2011:  6wk ROV & post hosp check> she was Adm by [facility] 1/8 - 08/12/11 w/ dyspnea (DOE, orthopnea, edema) thought to be due to diastolic heart failure (2DEcho w/ Gr2DD, BNP=2200), she was diuresed & improved, disch on LASIX40mg - 1/2 tab each am & her prev Diovan was stopped (due to renal insuffic w/ Creat=2.4)...  Her serum Calcium levels were all norm 9.6-9.8;  A1c=9.3;  Hg=10.4>> See below...    Feeling better since disch;  Weight 196# is still 8# heavier than her last visit w/ me 11/12;  We decided to decr her Pred from 10-5 Qod to 5mg /d & keep Lasix at 20mg /d... LABS 1/13:  BUN=33, Creat=1.7, BNP=100;  Ca=9.7;  Hg=12.9;  BS=69 & she will f/u w/ [name]...  ~  October 06, 2011:  65mo ROV on Pred 5mg /d & generally stable but w/ mult minor somatic complaints- sl SOB, some cough, thick white mucous, right arm discomfort, & reflux symptoms;  She had a ZPak called in about 2wks ago;  She is on a good antireflux regimen, plus Flonase & Saline;  We will add Tussionex for prn use;  Unfortunately weight is up 9# further tp 205# today;  BP remains controlled on 5 MEDS (see below);  She continues to f/u w/ [name] for DM> on Levimir 46u & Apidra 03-15-13;  We decided to cut the Pred to 5mg  QOD & keep the Lasix 20mg /d... LABS 3/13:  BS=135,  BUN=38, Creat=1.6;  Ca=10.1;  ACE=48;  BNP=128...  ~  January 06, 2012:  65mo ROV & [name] has been on Pred5mg Qod for the last 488mo; she has noted some joint pains & has rash on her arms- seeing derm w/ hx Psoriasis but she feels their topical rx isn't working; she also notes sl cough, some greenish sput production, chest feels tight, & some right sided chest discomfort- she is requesting a ZPak which seems to work for her...     She saw DrAlt 4/13> Ave BS from her  machine 249 & A1c=9.1, insulin adjusted; FLP looked good on Lip20; extensive note reviewed...    [name], meds, xrays and labs> see below>> LABS 6/13:  Chems- BS=169 BUN=41 Creat=2.3 BNP=104 Ca=11.0 ACE=86;  CBC- ok w/ Hg=12.4 REC> Stop Lasix20 for now, increase Pred to 10mg /d, ROV 6wks w/ BMet...  ~  March 30, 2012:  65mo ROV & post-ER visit> she's been to the ER twice in the last 77mo w/ SOB, cough, congestion, etc; on Pred, Advair, Mucinex, Tussionex, & given antibiotics, Neb Rx, etc & improved;  Also rec to restart     She saw [name] for Cards f/u 7/13> known CAD, DOE, good BP control & 2DEcho showed good LVF & no evid for pulmHTN; continue same meds, & Lasix...    She had f/u [name] 6/13> DM, renal insuffic, hyperlipidemia, hypercalcemia, etc; on Levemir80, Apidra20-34-30, [id number] & Amaryl4; BS=118 A1c=8.8.Marland KitchenMarland Kitchen [name], meds, xrays and labs> see below for updates >> CXR 8/13 showed cardiomeg, ?mild interstitial edema, mild DJD TSpine... EKG 8/13 showed NSR, rate93, min NSSTTWA, NAD... LABS 8/13:  CBC- Hg=11.9 WBC=18K;  Chems- BUN=30 Creat=1.6 Ca=10.0 BNP=2960 ==> started back on Lasix40/d...  ~  April 28, 2012:  65mo ROV & last  visit we decreased the Pred to 5mg /d (on this for hypercalcemia from Sarcoid) & decreased the Lasix to 20mg /d (due to RI w/ Cr=2.2);  Follow up labs today showed Ca=9.1 and we decided to decr the Pred further to 5mg  Qod;  In addition her BUN=40, Creat=2.1, BNP=196 (ok to continue Lasix20); she understands that if her Creat starts to rise further that we will have to send her to Nephrology...    She saw [name] for f/u 9/13> note reviewed & he adjusted her insulin regimen- Levemir 80uQam & Apidra 20-34-30 + SS adjustments...    She fell 8/30 & hit her head> went to ER & [name]'s note is reviewed- c/o headache & left knee pain; Exam showed forehead hematoma & CTBrain showed left frontal scalp hematoma, no fx, mild cortical atrophy,  mild carotid atherosclerosis, NAD;  Maxillofacial CT was neg w/o facial bone fx;  Left knee prosthesis & no acute changes...    She reports that she just had an ESI injection from [name] for LBP & she is improved...    [name], meds, xrays and labs> see below for updates >> LABS 9/13:  Chems- BS=234, Ca=9.1, BUN=40, Creat=2.1, BNP=196   ~  June 21, 2012:  65mo ROV & [name] went to the ER 06/14/12 w/ c/o SOB> she had developed a sore throat, wheezing, cough, & SOB; she had taken ZPak, Delsym, Proair which helped somewhat;  CXR showed cardiomeg, pulm vasc congestion, DJD in TSpine, NAD;  ER treated w/ Pred boost to 40mg /d, & NEB Rx which really helped;  She notes stuff head & some drainage, denies GI/ reflux symptoms;  She is obese, cushingoid, and we were in the process of weaning off the Pred when it was boosted up by ER- we gave her a NEB Rx & this really helped;  Placed on O2 by nasal cannula for hypoxemia- 84% on RA;  Decision made to wean Pred down to 5mg Qod as before, continue Advair250Bid, add NEBS Qid w/ Albut vs Proair, plus her Mucinex/ Fluids/ etc...    She continues her regular f/u appts w/ [name], Endocrine> on Levemir, Apidra, Amaryl, Januvia; last A1c=8.1 is Sep2013...    [name], meds, xrays and labs> see below for updates >>   ~  October 25, 2012:  65mo ROV & post hospital visit> she was [name] by [facility] 3/18 - 10/22/12> presented w/ weakness, difficulty ambulating, sl confusion w/ memory loss; she had been to [name]'s office & labs showed Ca>14 w/ Creat>4;  Hypercalcemia related to Sarcoidosis- treated w/ IVF, Lasix, Calcitonin, & Solumedrol- ACE level=80;  She was disch on Pred20Bid but we will need to wean this quickly due to her DM;  She had acute renal failure as well w/ Creat>4 and after hydration she was disch w/ Creat ~2;  DM followed & treated by [name];  DCSummary reviewed in detail>>    Sarcoidosis w/ HYPERCALCEMIA> Hypercalcemia returned  when Pred was cut to 5mg  Qod; now back on Pred20Bid post hosp & Ca=10.6; we discussed wean to 20mg /d w/ ROV & recheck Ca level in 3 weeks...     HBP> on Metop25- 1/2, Norvasc10, Apres10Tid, Clonidine0.1Bid, Lasix20, K10-2/d; BP= 160/72 & she denies CP, palpit, dizzy, ch in SOB/ DOE, edema, etc...    Cardiac Arrhythmia> she has WCT & SBrady- eval by DrAllred for Cards & meds adjusted...    CHOL> on Lip20; last FLP was 4/12- looked good & we reviewed low fat diet restrictions...    DM  on Insulin> followed by DrA on Levemir30 & ApidraSS coverage at meals; BS at home varies 150-400+, labs done Q924mo by [name]...    Obesity> wt= 203# (63"Tall & BMI=37) and we reviewed diet, exercise, & wt reduction strategies...    GI> GERD on NexiumBid; and we reviewed antireflux regimen (elev HOB, npo after dinner, etc)...    Renal Insuffic> Creat has returned to 2.08 now; advised incr free water intake...    Anxiety> on Alprazolam prn; she's been under extra stress w/ loss of job, this illness, family issues... [name], meds, xrays and labs> see below for updates >>   ~  November 08, 2012:  2week ROV & she has been stable on Pred20mg /d; still tired, notes DOE w/ walking; BMet today shows K=5.1, BUN=51, Cr=2.1, Ca=9.4; we discussed decr Pred to 10mg /d, incr free water intake, continue Lasix20; ROV 28mo...    Meds reviewed> HBP controlled on her ; Chol is ok on Lip20; DM regulated by [name] on Levimir & Apidra; wt is unfortunately up to 205# & we reviewed diet/ exercise/ etc; she is coping well w/ her serious medical problems...  ~  Dec 19, 2012:  6wk ROV & recheck after her last visit w/ stable numbers on Pred20mg /d; we decided to decr to 10mg /d & follow up at this time to recheck her Calcium etc; unfortunately she made a mistake & has continued the Pred at the 20mg /d dose since her last visit... Feeling OK overall, her wt is up 3# to 207# today, BP= 138/72, and she recently had f/u [name] to  recheck her DM (insulin adjusted)...  We reviewed her medication record & her procedures and instructed her to decr the Pred to 10mg /d at this point... We wil  Recheck pt in 6-8weeks w/ BMet at that time...  ~  February 19, 2013:  224mo ROV & when last seen Ezra cut her Pred to 10mg /d> she sees [name] frequently w/ extensive lab work done at each visit; last labs avail to [place] 6/14 showed Calcium=9.6, BUN=36, Cr=1.86, A1c=8.5; We decided to continue Pred taper & she is instructed to decr the Pred to 05-06-09-5 Qod w/ ROV planned 24mo...  She has Home O2 for her Sarcoid, chronic hypoxemic resp failure, DiastolicCHF> we did Ambulatory O2 check today>  O2sats on RA:  Rest= 94% w/ pulse72;  After 3laps= 82% w/ pulse=90...     OSA on CPAP but only using 1-2d/wk    BP= 122/80 on her cardiac meds> Metop, Amlod, Hydral, Clonid, Lasix40; she denies CP, palpit, dizzy/ syncope, SOB, edema...    DM treated by DrA w/ Levemir 78uAM-0uPM, Apidra 18-30-26, Glimep4, [id number]; he continues to check her every month w/ extensive labs & his notes reviewed...    Despite diet etc her weight is up to 214# & we reviewed wt reduction strategy...    Renal insuffic w/ Cr ~2 chronically [name], meds, xrays and labs> see below for updates >>           Problem List:    OBSTRUCTIVE SLEEP APNEA - Sleep Study 11/07 showed RDI=21 w/ desat to 62% during REM.Marland Kitchen. eval by DrSood w/ Rx for CPAP 12... compliance poor- only using it 1-2 times/wk... encouraged to use CPAP more regularly & to f/u w/ DrSood regarding the mask interface problems... ~  8/11:  She reports new mask but still not using CPAP regularly, "I rest fairly well". ~  7/12:  She reports not using her CPAP, and not resting well... ~  1/13:  CPAP used briefly during [facility] for diastolic CHF, but still not compliant w/ home use... ~  7/14:  OSA on CPAP but only using 1-2d/wk... we did Ambulatory O2 check today>  O2sats on RA:  Rest= 94% w/ pulse72;  After 3laps= 82%  w/ pulse=90...   BRONCHITIS, ACUTE - on ADVAIR 250Bid (using Prn only) & PROAIR as needed. 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 ~  CXR 1/09 was pre-op for TKR- sl cardomeg & ?mild vasc congestion. ~  CXR 2/11 showed norm heart size & vascularity, clear, s/p cholecystectomy, DJD in TSpine. ~  CXR & CT Chest 11/11 showed sl peribronch thickening, no lung lesions, no signif adenopathy. ~  PET scan 1/12 showed hypermetabolic activ in spleen & lymph nodes in the supraclav, hilar, mediastinal, periaotic & iliac region as well. ~  CXRs 3/12 in the perioperative period after splenectomy showed left effusion==> tapped & improved aeration. ~  CXR 4/12 back to baseline w/ clear bases, essent wnl... ~  CXR 7/12 showed norm heart size, clear lungs, no definite adenopathy, NAD... ~  LABS 7/12 w/ ACE=78 (8-52), Ca=12.0, AlkPhos=176 (39-117), GGT=151 (7-51) ~  Labs from [name]> 8/12: Ca=12.4, Creat=1.8; then 9/12: Ca=13.4, Creat=2.3;  ~  10/12:  PREDNISONE 20mg /d started 10/12 & 3wks later Ca=10.3, Creat=1.8; therefore weaned to 20-10 Qod. ~  11/12: Labs on Pred 20-10 Qod showed Ca++= 9.8; we will continue to wean Pred. ~  1/13:  CXR showed cardiomeg, mild interstitial edema, DJD spine; Labs showed Ca=9.7 & we decided to wean Pred to 5mg /d... ~  3/13:  Labs showed Ca=10.1 on Pred 5mg /d; decided to wean to 5mg Qod... ~  6/13:  Labs showed Ca=11.0, ACE=86; on Pred5Qod & rec to incr back to 10mg /d... ~  8/13:  CXR showed cardiomeg, ?mild interstitial edema, mild DJD TSpine... ~  Labs 8/13 showed Ca=10.0 on Pred10/d; rec to decr further to 5mg /d... ~  Labs 9/13 showed Ca= 9.1 on Pred5mg /d & she will try to decr to 5Qod again... ~  11/13: she recently went to ER w/ incr SOB & was placed on Pred40/d; we are adding NEBS w/ ALBUT2.5mg Qid & wean Pred back to 5mg Qod as before. ~  3/14: she was hosp w/ Ca>14 (on the Pred5mg Qod); treated w/ IVF, Lasix, Calcitonin  & incr steroids; Disch on Pred20Bid; we will wean Pred to 20mg /d & f/u in 3wk. ~  4/14:  On Pred20/d & calcium improved to 9.4; we will decr to Pred10mg /d & f/u 73mo... ~  7/14:  On Pred10mg /d & calcium remains well controlled at 9.6 and we decided to decr Pred to 05-06-09-5 Qod...  HYPERTENSION - taking METOPROLOL 25mg - 1/2 daily, NORVASC 10mg /d, APRESOLINE 10mg Tid, CLONIDINE 0.1mg Bid, LASIX 20mg - see below; off prev [id number] due to azotemia w/ diuresis. ~  4/12:  BP here 132/62 and she denies HA, visual changes, CP, palipit, dizziness, syncope, change in dyspnea, etc... ~  10/12:  BP= 132/80 & she denies CP, palpit, dizzy, ch in SOB/ DOE, edema, etc... ~  11/12:  BP= 132/82 & she is relatively asymptomatic other than DOE from her weight and some musc cramps... ~  1/13: Post Hosp BP= 146/70 on ? - Metop12.5, Amlod10, Clonid0.1Bid, Lasix20; she denies CP, palpit, SOB, edema> improved from recent hosp... ~  3/13: She called in the interim for "refill" Apresoline not prev on any med list; BP= 150/80 on all 5 meds; must elim sodium & get wt down... ~  6/13:  BP= 142/80  on but BUN-41 Creat=2.3 BNP=104; rec to stop Lasix for now, f/u BMet 6wks. ~  8/13:  BP= 140/70 on same meds & BUN=30, Creat=1.6, but BNP incr to 2960 & LASIX 40mg /d restarted... ~  9/13:  Follow up labs showed BUN=45, Creat=2.2 on Lasix40 and we rec decr to Lasix20... ~  9/13:  BP= 122/74 on w/ BUN=40, Creat=2.1, & BNP=196... Continue same. ~  11/13:  BP= 142/68 on same ... ~  3/14:  BP= 160/72 on same 5 meds for now... ~  4/14:  BP= 128/70 on her regimen...  ~  7/14:  BP= 122/80 on her 5 cardiac meds> Metop, Amlod, Hydral, Clonid, Lasix40; she denies CP, palpit, dizzy/ syncope, SOB, edema...  CAD & CARDIAC ARRHYTHMIA >> on ASA 81mg /d...  ~  Hx non-obstructive CAD w/ cath 6/07 by DrStuckey showing luminal irregularities & tiny first marginal branch of the CIRC w/ ostial lesion... good LVF. ~  2DEcho 9/07  showed mildly calcif AoV and normal LVF & wall motion. ~  2DEcho 11/11 showed norm LV wall thickness, norm LVF w/ EF= 60-65%, norm atria, norm valves, trivial peric fluid behind heart. ~  2DEcho 1/13 showed mild LVH, norm LVF w/ EF=55-60% & no regional wall motion abn, Gr2DD, norm RV... ~  3/14: In hosp for hypercalcemia etc> she had cardiac arrhythmia w/ WCT & SBrady- to be f/u DrAllred... ~  5/14: she had f/u DrAllred> hosp f/u for WCT (atrial tachy per EP), pt asymptomatic- denies CP, palpit, SOB, dizzy/ syncope, edema, etc; no further w/u planned, f/u prn... ~  7/14: she had f/u [name]> chr diastolicCHF, nonobstructiveCAD, mildMR- stable, no changes made, f/u 57yr...  CEREBROVASCULAR DISEASE - she remains on ASA 81mg /d without TIA's or other neuro manifestations...  ~  MRA Br 9/07 showed mod intracranial atherosclerotic changes...   HYPERLIPIDEMIA - on LIPITOR 20mg /d... FLP monitored at each OV by [name]... ~  FLP 7/07 showed TChol 114, TG 85, HDL 28, LDL 69 ~  FLP 4/09 showed TChol 154, TG 192, HDL 34, LDL 82... discussed poss of adding fibrate- hold... ~  FLP 2/10 on Lip20 showed TChol 159, TG 207, HDL 29, LDL 83... rec> add Fenoglide (she didn't). ~  FLP 2/11 on Lip20 showed TChol 185, TG 242, HDL 43, LDL 99... add FENOFIBRATE 160mg /d... ~  FLP 8/11 on Lip20+Feno160 showed TChol 167, TG 216, HDL 37, LDL 96... she stopped Feno160. ~  FLP 4/12 on Lip20 showed TChol 146, TG 115, HDL 35, LDL 88 ~  FLP 2/13 on Lip20 showed TChol 159, TG 176, HDL 41, LDL 86 ~  LABS checked by [name]  DM - on LEVEMIR & APIDRA SS coverage now per [name]; plus GLIMEPIRIDE 4mg /d and JANUVIA 50mg /d. ~  labs 4/08 showed BS=138 & HgA1c=7.5.Marland Kitchen. home BS = 100 to >300... misses doses and not on diet or exercising... ~  1/09 became hypoglycemic in hosp on 4 meds post knee surg- meds adjusted... ~  3/09 restarted GLIMEPIRIDE 4mg - 1/2 tab each AM, & continue Lantus & Metformin... ~  labs 4/09 showed BS= 148,  HgA1c= 7.3.Marland Kitchen. rec> back on 4 med regimen due to poor control at home. ~  6/09 discussed titrating the Lantus up until FBS 100-120 range... ~  labs 2/10 showed BS= 164, HgA1c= 8.6.Marland KitchenMarland Kitchen very disappointing- needs home BS monitoring, incr Lantus. ~  labs 2/11 showed BS= 298, A1c= 8.4...  discussed change Lantus to LEVEMIR 40 u daily... ~  labs 8/11 showed BS= 202, A1c= 10.9.Marland Kitchen. rec>  incr Levemir to 50, consider Humalog. ~  Nov-Dec 2011:  BS up w/ adjust of meds & addition of Pred after hosp 11/11 & hypercalcemia... ~  She states she returned to Levemir 40u daily after the splenectomy hosp due to appetite etc;  4/12 BS=117 ~  Labs 7/12 showed BS= 167, A1c= 8.4> on Levemir 50u/d & Amaryl 4mg  Qam (note creat 1.9). ~  8-10/12: on Levemir30 + ApidraSS per DrA & BS are 150-400+ due to the Pred20; A1c 8/12 was 7.6 ~  11/12:  DrA has increased her Levemir to 46u/d & the Apidra to 10+SS cover Tid... BS here= 168 ~  1/13: Covered w/ SSI in Hosp but A1c=9.3 (Creat up to 2.4 but improved to 1.7 w/ adjust meds)... ~  [name]'s notes indicates continued adjustments in her insulin regimen w/ Levemir & Apidra...  OBESITY (ICD-278.00) - weight down to 171 after hosp 11/11- doing better on diet, not yet exercising. ~  weight 220-230# in the early 1990's... ~  weight 210-220# in the early 2000's... ~  weight 2/10 = 192# ~  weight 2/11 = 186# ~  weight 8/11 = 174# ~  weight 12/11 = 171# ~  Weight 4/12 = 168# ~  Weight 7/12 = 171# ~  Weight 11/12 = 188# ~  Weight 1/13 = 196# ~  Weight 3/13 = 205#... She MUST get back on diet, incr exercise, get wt down. ~  Weight 6/13 = 205# ~  Weight 8/13 = 210# ~  Weight 9/13 = 220#... What happened? ~  Weight 11/13 = 225# and BMI= 40 ~  Weight 3/14 = 203# ~  Weight 7/14 = 214#  HYPERCALCEMIA> this was most likely due to sarcoidosis w/ extensive splenic involvement;  Calcium was as hight as 13.7 and returned to normal after the splenectomy. ~  Labs 4/12 showed calcium =  11.1 & this is very disappointing... ~  Labs 7/12 showed Ca= 12.0, Phos= 4.0, ACE= 78 ~  Labs 8-10/12 showed Ca up to 13.4, then down to 10.3 on PRED20mg /d... ~  Labs 11/12 showed Ca= 9.8 on Pred 20-10 qod; we will continue to wean. ~  Labs 1/13 in [place] showed Ca= 9.7 range on Pred 10-5 Qod;  We decided to wean Pred to 5mg /d... ~  Labs 3/13 showed Ca=10.1 on Pred5mg /d; rec to decr to 5mg Qod... ~  Labs 6/13 showed Ca=11.0 on Pred5Qod; rec incr to 10mg /d... ~  Labs 8/13 showed Ca=10.0 on Pred10/d; rec to decr to 5mg /d... ~  Labs 9/13 showed Ca=9.1 on Pred5mg /d; rec to decr to 5mg Qod... ~  Labs 12/13 showed Ca= 9.2 to 10.5 on Pred 5mg Qod... ~  Labs 3/14 in hosp showed Ca= 14.5 & disch on Pred20Bid; 3/14 f/u Ca=10.6 & Pred decr to 20mg /d... ~  4/14: on Pred20/d and f/u labs showed Ca= 9.4... Rec to decr Pred to 10mg /d & f/u 41mo... ~  7/14: on Pred10/d and most recent Ca= 9.6.Marland KitchenMarland Kitchen rec to decr Pred to 05-06-09-5 Qod...  GERD - on NEXIUM 40mg  Bid... w/ increased reflux symptoms w/ noct cough etc...  ~  2/10: discussed optimal Rx w/ Nex before dinner, Zantac300 + Reglan10 at bed, elev HOB, etc. ~  2/11: she stopped the Reglan, still using Nexium/ Zantac but w/ persist symptoms>  refer to GI for eval. ~  6/11: GI f/u DrPerry w/ EGD that was normal... continue Rx. ~  12/11:  increased GI symptoms post hosp> she will f/u w/ DrPerry for his input> incr Nexium Bid.  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 ~  6/11:  f/u colonoscopy by DrPerry showed divertics, 4 polyps, AVM.Marland KitchenMarland Kitchen path= tubular adenoma, f/u 58yrs.  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 ~  Note: Serum Calcium ret to normal immediately after spleen removed...  RENAL INSUFFICIENCY (ICD-588.9)  >> Creat ~ 2.0 & eval 10/11 by DrFox- prob due to DM & HBP... ~  Labs 4/12 showed BUN= 27, Creat= 1.6 ~  Labs 7/12 showed BUN= 44, Creat= 1.9 (not on diuretics/ NSAIDs/ etc)... ~  Labs showed Creat up to 2.3 when Ca=13.4; now improved to 1.8 w/ Ca coming down... ~  Labs 11/12 showed BUN=38, Creat=1.7; rec to maintain hydration... ~  Labs 1/13 in [place] w/ diuresis showed Creat incr to 2.4 but ret to 1.7 w/ adjustment in meds... ~  Labs 3/13 showed BUN=38, Creat= 1.6 ~  Labs 6/13 showed BUN=41, Creat= 2.3; rec> stop Lasix20 for now, liberalize fluids. ~  Labs 9/13 showed BUN=40, Creat=2.1 on Lasix20, rec to continue the same. ~  Labs 12/13 showed Creat= 1.5 ~  Labs 3/14 in hosp showed Creat=4, then decr to 2.00 by dischage... ~  Labs 4/14 showed BUN=51, Creat=2.1, and rec to incr free water as we wean the Pred... 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 on LYRICA 100mg  Qhs, off prev Tramadol Rx... ~  2010 eval by DrHiatt @ [facility] on Tramadol 50mg , Lyrica 75mg Bid...  GOUT, UNSPECIFIED (ICD-274.9) - Uric in the 7-8 range...on ALLOPURINOL 300mg /d for prevention...  ANXIETY (ICD-300.00) - she is under mod stress- work, mother died, hospice counselling, etc... ~  8/11:  rec starting Alprazolam 0.5mg  Tid for palpit, SOB, anxiety...  ANEMIA-NOS & MGUS >> see eval 10-11/11 by DrGranfortuna w/ bone marrow 11/11 in hosp= neg. ~  Labs 4/12 showed Hg= 11.6, MCV= 84... On FeSO4 daily. ~  Labs 7/12 showed Hg= 12.8, MCV= 90, Fe= 45 (14%sat)... ~  Labs 1/13 in [place] showed Hg=10.4 but improved to 12.9 on post hosp check in office... ~  Labs 6/13 showed Hg= 12.4 ~  Labs 12/13 showed Hg= 13.9 ~  Labs 3/14 showed Hg= 12.9  PSORIASIS - eval and Rx per DrTafeen prev on Humira injections- stopped 9/11...   Past Surgical History  Procedure Laterality Date  . Cholecystectomy    . Vesicovaginal fistula  closure w/  total abdominal hysterectomy    . Bilateral total knee replacements  Rt=5/04 & Lft=1/09    by DrAlusio  . Tonsillectomy    . Appendectomy    . Abdominal hysterectomy    . Breast reduction surgery  1982  . Open splenectomy  [id number]    by DrRosenbower    Outpatient Encounter Prescriptions as of 02/19/2013  Medication Sig Dispense Refill  . albuterol (PROVENTIL HFA;VENTOLIN HFA) 108 (90 BASE) MCG/ACT inhaler Inhale 2 puffs into the lungs every 6 (six) hours as needed. For shortness of breath.  3 each  3  . albuterol (PROVENTIL) (2.5 MG/3ML) 0.083% nebulizer solution Take 3 mLs (2.5 mg total) by nebulization 4 (four) times daily. Shortness of breath  1080 mL  3  . amLODipine (NORVASC) 10 MG tablet take 1 tablet by mouth once daily  90 tablet  3  .  aspirin EC 81 MG tablet Take 81 mg by mouth daily.      . cloNIDine (CATAPRES) 0.1 MG tablet take 1 tablet by mouth twice a day  180 tablet  3  . Dextromethorphan-Guaifenesin (TUSSIN DM) 10-100 MG/5ML liquid Take 5 mLs by mouth daily as needed (for cough).      . esomeprazole (NEXIUM) 40 MG capsule Take 1 capsule (40 mg total) by mouth 2 (two) times daily.  90 capsule  3  . ferrous sulfate 325 (65 FE) MG tablet Take 325 mg by mouth daily with breakfast.      . fluticasone (FLONASE) 50 MCG/ACT nasal spray Place 2 sprays into the nose at bedtime.  48 g  3  . Fluticasone-Salmeterol (ADVAIR) 250-50 MCG/DOSE AEPB Inhale 1 puff into the lungs 2 (two) times daily.  3 each  3  . glucose blood test strip 1 each by Other route as needed for other. Use as instructed      . guaiFENesin (MUCINEX) 600 MG 12 hr tablet Take 1,200 mg by mouth daily.      . hydrALAZINE (APRESOLINE) 10 MG tablet take 1 tablet by mouth three times a day  270 tablet  3  . insulin detemir (LEVEMIR) 100 UNIT/ML injection Inject 70 Units into the skin every morning.       . insulin glulisine (APIDRA SOLOSTAR) 100 UNIT/ML injection Use 18  units before breakfast and 30 units  before lunch and 26  units before dinner      . ketorolac (ACULAR LS) 0.4 % SOLN Place 1 drop into both eyes 3 (three) times daily.       . Lancets MISC 1 Units by Does not apply route 4 (four) times daily - after meals and at bedtime.      Marland Kitchen LORazepam (ATIVAN) 1 MG tablet Take 1 mg by mouth daily as needed. For anxiety.      . metoprolol tartrate (LOPRESSOR) 25 MG tablet Take 0.5 tablets (12.5 mg total) by mouth daily.  45 tablet  3  . predniSONE (DELTASONE) 10 MG tablet Take as directed(alternate 10mg , 5 mg, 10 mg, 5 mg)      . pregabalin (LYRICA) 100 MG capsule Take 1 capsule (100 mg total) by mouth every evening.  90 capsule  3  . promethazine (PHENERGAN) 25 MG tablet Take 1 tablet (25 mg total) by mouth every 6 (six) hours as needed for nausea.  20 tablet  0  . sennosides-docusate sodium (SENOKOT-S) 8.6-50 MG tablet Take 1 tablet by mouth at bedtime.      . [DISCONTINUED] allopurinol (ZYLOPRIM) 100 MG tablet take 1 tablet by mouth once daily  30 tablet  11  . [DISCONTINUED] atorvastatin (LIPITOR) 20 MG tablet take 1 tablet by mouth once daily  30 tablet  11  . [DISCONTINUED] furosemide (LASIX) 40 MG tablet TKAE 1/2 TABLET BY MOUTH DAILY  90 tablet  3  . [DISCONTINUED] predniSONE (DELTASONE) 10 MG tablet Take 1 tablet (10 mg total) by mouth daily.  90 tablet  3   No facility-administered encounter medications on file as of 02/19/2013.    Allergies  Allergen Reactions  . Adhesive (Tape) Other (See Comments)    Blisters   . Avelox (Moxifloxacin Hcl In Nacl)     GI upset  . Codeine Other (See Comments)    "crazy"   . Guaifenesin Nausea And Vomiting  . Latex Swelling  . Oxycodone Nausea And Vomiting    Current Medications, Allergies, Past Medical History,  Past Surgical History, Family History, and Social History were reviewed in [employer] record.   Review of Systems         See HPI - all other systems neg except as noted... The patient complains of  dyspnea on exertion.  The patient denies anorexia, fever, weight loss, weight gain, vision loss, decreased hearing, hoarseness, chest pain, syncope, peripheral edema, prolonged cough, headaches, hemoptysis, abdominal pain, melena, hematochezia, severe indigestion/heartburn, hematuria, incontinence, muscle weakness, suspicious skin lesions, transient blindness, difficulty walking, depression, unusual weight change, abnormal bleeding, enlarged lymph nodes, and angioedema.     Objective:   Physical Exam      WD, Overweight, 66 y/o BF in NAD...  GENERAL:  Alert & oriented; pleasant & cooperative... HEENT:  [place]/AT, EOM-wnl, PERRLA, EACs-clear, TMs-wnl, NOSE-clear, THROAT- clear & wnl... NECK:  Supple w/ fair ROM; no JVD; normal carotid impulses w/o bruits; palp thyroid, w/o nodules felt; no lymphadenopathy. CHEST:  Clear to P & A; without wheezes/ rales/ or rhonchi. HEART:  Regular Rhythm; gr 1/6 SEM, without rubs/ or gallops. ABDOMEN:  Scar from splenectomy surg; obese, soft, & nontender w/ panniculus; normal bowel sounds; no organomegaly or masses detected EXT: without deformities, mod arthritic changes, s/p bilat TKRs; no varicose veins/ +venous insuffic/ tr edema. NEURO:  CN's intact; no focal neuro deficits... DERM:  Psoriasis rash per DrTafeen...  RADIOLOGY DATA:  Reviewed in the EPIC EMR & discussed w/ the patient...  LABORATORY DATA:  Reviewed in the EPIC EMR & discussed w/ the patient...   Assessment & Plan:    SARCOIDOSIS>  Main manifestation has been her hypercalcemia> she has proven that when her Pred is weaned to 5mg Qod the hypercalcemia returns; we are trying to determine her lowest dose threshold & Ca=9.6 on Pred10mg /d; Rec to wean slowly to 05-06-09-5 w/ regular f/u as planned...  Hypercalcemia>  Initially responded to splenectomy but her calcium level rose again over several months after the surg; we were forced to use Pred rx in this difficult diabetic; we had her see  [name] for DM/ endocrine consult; calcium levels have responded nicely to Pred Rx but climbed back up when Pred was weaned to 5mg Qod (see above)...    ASTHMATIC BRONCHITS>  ER 11/13 bumped her Pred to 40mg /d which negated our slow taper w/ monitoring of her Calcium levels; NEB Rx w/ albut seemed to give the the max benefit & we need to wean down the Pred as much as poss; she is also hypoxemic w/ O2sat=84% on RA (likely from V/Q mismatching);  We decided to Rx w/ HomeO2- 1L/min rest, 2L/min exercise for now; continue home meds + add NEBS Qid => continue same...  HBP>  On same 5 med regimen & BP is fair; she is sched to see Cards soon & we will wait & see if they adjust meds... 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  DM>  Control has been difficult;  Stressed diet/ exercise, & get wt down; DrA has her on Levemir & ApidraSS coverage...  Renal Insuffic>  Creat was up to 4 in [place] & then back to 2.0 w/ Lasix at 20mg /d; continue same...  Other medical problems as noted...   Patient's Medications  New Prescriptions   No medications on  file  Previous Medications   ALBUTEROL (PROVENTIL HFA;VENTOLIN HFA) 108 (90 BASE) MCG/ACT INHALER    Inhale 2 puffs into the lungs every 6 (six) hours as needed. For shortness of breath.   ALBUTEROL (PROVENTIL) (2.5 MG/3ML) 0.083% NEBULIZER SOLUTION    Take 3 mLs (2.5 mg total) by nebulization 4 (four) times daily. Shortness of breath   AMLODIPINE (NORVASC) 10 MG TABLET    take 1 tablet by mouth once daily   ASPIRIN EC 81 MG TABLET    Take 81 mg by mouth daily.   CLONIDINE (CATAPRES) 0.1 MG TABLET    take 1 tablet by mouth twice a day   DEXTROMETHORPHAN-GUAIFENESIN (TUSSIN DM) 10-100 MG/5ML LIQUID    Take 5 mLs by mouth daily as needed (for cough).    ESOMEPRAZOLE (NEXIUM) 40 MG CAPSULE    Take 1 capsule (40 mg total) by mouth 2 (two) times daily.   FERROUS SULFATE 325 (65 FE) MG TABLET    Take 325 mg by mouth daily with breakfast.   FLUTICASONE (FLONASE) 50 MCG/ACT NASAL SPRAY    Place 2 sprays into the nose at bedtime.   FLUTICASONE-SALMETEROL (ADVAIR) 250-50 MCG/DOSE AEPB    Inhale 1 puff into the lungs 2 (two) times daily.   GLUCOSE BLOOD TEST STRIP    1 each by Other route as needed for other. Use as instructed   GUAIFENESIN (MUCINEX) 600 MG 12 HR TABLET    Take 1,200 mg by mouth daily.   HYDRALAZINE (APRESOLINE) 10 MG TABLET    take 1 tablet by mouth three times a day   INSULIN DETEMIR (LEVEMIR) 100 UNIT/ML INJECTION    Inject 70 Units into the skin every morning.    INSULIN GLULISINE (APIDRA SOLOSTAR) 100 UNIT/ML INJECTION    Use 18  units before breakfast and 30 units before lunch and 26  units before dinner   KETOROLAC (ACULAR LS) 0.4 % SOLN    Place 1 drop into both eyes 3 (three) times daily.    LANCETS MISC    1 Units by Does not apply route 4 (four) times daily - after meals and at bedtime.   LORAZEPAM (ATIVAN) 1 MG TABLET    Take 1 mg by mouth daily as needed. For anxiety.   METOPROLOL TARTRATE (LOPRESSOR) 25 MG TABLET    Take 0.5 tablets (12.5 mg total) by mouth daily.   PREGABALIN (LYRICA) 100 MG CAPSULE    Take 1 capsule (100 mg total) by mouth every evening.   PROMETHAZINE (PHENERGAN) 25 MG TABLET    Take 1 tablet (25 mg total) by mouth every 6 (six) hours as needed for nausea.   SENNOSIDES-DOCUSATE SODIUM (SENOKOT-S) 8.6-50 MG TABLET    Take 1 tablet by mouth at bedtime.  Modified Medications   Modified Medication Previous Medication   ALLOPURINOL (ZYLOPRIM) 100 MG TABLET allopurinol (ZYLOPRIM) 100 MG tablet      take 1 tablet by mouth once daily    take 1 tablet by mouth once daily   ATORVASTATIN (LIPITOR) 20 MG TABLET atorvastatin (LIPITOR) 20 MG tablet      take 1 tablet by mouth once daily    take 1 tablet by mouth once  daily   FERROUS SULFATE 325 (65 FE) MG TABLET ferrous sulfate 325 (65 FE) MG tablet      take 1 tablet by mouth once daily with meals    take 1 tablet by mouth once daily WITH A MEAL   FUROSEMIDE (LASIX) 40 MG TABLET  furosemide (LASIX) 40 MG tablet      TKAE 1/2 TABLET BY MOUTH DAILY    TKAE 1/2 TABLET BY MOUTH DAILY   PREDNISONE (DELTASONE) 10 MG TABLET predniSONE (DELTASONE) 10 MG tablet      Take as directed(alternate 10mg , 5 mg, 10 mg, 5 mg)    Take 1 tablet (10 mg total) by mouth daily.  Discontinued Medications   No medications on file

## 2013-03-09 ENCOUNTER — Encounter (HOSPITAL_COMMUNITY): Payer: Self-pay | Admitting: Emergency Medicine

## 2013-03-09 ENCOUNTER — Emergency Department (HOSPITAL_COMMUNITY)
Admission: EM | Admit: 2013-03-09 | Discharge: 2013-03-09 | Disposition: A | Discharge status: Home / Self Care | Payer: Medicare Other | Source: Home / Self Care | Attending: Family Medicine | Admitting: Family Medicine

## 2013-03-09 DIAGNOSIS — R197 Diarrhea, unspecified: Secondary | ICD-10-CM

## 2013-03-09 DIAGNOSIS — R1110 Vomiting, unspecified: Secondary | ICD-10-CM

## 2013-03-09 LAB — COMPREHENSIVE METABOLIC PANEL
AST: 19 U/L (ref 0–37)
BUN: 34 mg/dL — ABNORMAL HIGH (ref 6–23)
CO2: 27 mEq/L (ref 19–32)
Calcium: 9.9 mg/dL (ref 8.4–10.5)
Chloride: 102 mEq/L (ref 96–112)
Creatinine, Ser: 2.06 mg/dL — ABNORMAL HIGH (ref 0.50–1.10)
GFR calc Af Amer: 28 mL/min — ABNORMAL LOW (ref >90)
GFR calc non Af Amer: 24 mL/min — ABNORMAL LOW (ref >90)
Glucose, Bld: 300 mg/dL — ABNORMAL HIGH (ref 70–99)
Total Bilirubin: 0.3 mg/dL (ref 0.3–1.2)

## 2013-03-09 LAB — POCT URINALYSIS DIP (DEVICE)
Bilirubin Urine: NEGATIVE
Ketones, ur: NEGATIVE mg/dL
Leukocytes, UA: NEGATIVE
Nitrite: NEGATIVE
pH: 6 (ref 5.0–8.0)

## 2013-03-09 LAB — CBC WITH DIFFERENTIAL/PLATELET
Eosinophils Relative: 2 % (ref 0–5)
HCT: 40.8 % (ref 36.0–46.0)
Hemoglobin: 13.6 g/dL (ref 12.0–15.0)
Lymphocytes Relative: 16 % (ref 12–46)
Lymphs Abs: 2 10*3/uL (ref 0.7–4.0)
MCV: 93.8 fL (ref 78.0–100.0)
Monocytes Absolute: 1.5 10*3/uL — ABNORMAL HIGH (ref 0.1–1.0)
Monocytes Relative: 12 % (ref 3–12)
Neutro Abs: 8.8 10*3/uL — ABNORMAL HIGH (ref 1.7–7.7)
RBC: 4.35 MIL/uL (ref 3.87–5.11)
RDW: 14.3 % (ref 11.5–15.5)
WBC: 12.5 10*3/uL — ABNORMAL HIGH (ref 4.0–10.5)

## 2013-03-09 MED ORDER — ONDANSETRON 4 MG PO TBDP
4.0000 mg | ORAL_TABLET | Freq: Once | ORAL | Status: AC
  Administered 2013-03-09: 4 mg via ORAL

## 2013-03-09 MED ORDER — ONDANSETRON 4 MG PO TBDP
ORAL_TABLET | ORAL | Status: AC
  Filled 2013-03-09: qty 1

## 2013-03-09 MED ORDER — HYDROCODONE-ACETAMINOPHEN 5-325 MG PO TABS: 2.0000 | ORAL_TABLET | ORAL | Status: DC | PRN

## 2013-03-09 MED ORDER — ONDANSETRON 4 MG PO TBDP: 4.0000 mg | ORAL_TABLET | Freq: Three times a day (TID) | ORAL | Status: DC | PRN

## 2013-03-09 MED ORDER — DIPHENOXYLATE-ATROPINE 2.5-0.025 MG PO TABS: 1.0000 | ORAL_TABLET | Freq: Four times a day (QID) | ORAL | Status: DC | PRN

## 2013-03-09 MED ORDER — DIPHENOXYLATE-ATROPINE 2.5-0.025 MG PO TABS: 1.0000 | ORAL_TABLET | Freq: Once | ORAL | Status: DC

## 2013-03-09 NOTE — ED Notes (Signed)
Pt c/o lower abd pain onset Sunday sxs include: n/d... Has had 4 loose stools today... Denies f/v, urinary sxs Taking pepto bismol w/temp relief Alert w/no signs of acute distress.

## 2013-03-09 NOTE — ED Provider Notes (Signed)
CSN: [id number]     Arrival date & time 03/09/13  1507 History     First MD Initiated Contact with Patient 03/09/13 1613     Chief Complaint  Patient presents with  . Abdominal Pain   (Consider location/radiation/quality/duration/timing/severity/associated sxs/prior Treatment) Patient is a 67 y.o. female presenting with diarrhea. The history is provided by the patient. No language interpreter was used.  Diarrhea Quality:  Watery Severity:  Moderate Onset quality:  Sudden Number of episodes:  Multiple Duration:  5 days Timing:  Constant Progression:  Unchanged Relieved by:  Nothing Worsened by:  Nothing tried Associated symptoms: vomiting   Associated symptoms: no abdominal pain   Risk factors: no recent antibiotic use   Pt has Diabetes and renal insufficiency.     Past Medical History  Diagnosis Date  . Unspecified essential hypertension   . CAD (coronary artery disease) 01/04/2006    Tiny OM1 70-90% ostial stenosis, no other CAD  . Asthma   . OSA (obstructive sleep apnea)     marginally compliant with CPAP  . Esophageal reflux   . Obesity, unspecified   . Degenerative joint disease   . Anxiety state, unspecified   . Hypercalcemia   . Anemia   . Other psoriasis   . Diverticulosis   . Sarcoidosis 2012  . Diabetes mellitus   . Kidney disease     baseline creatinie 2.0,  folowed by Dr Caryn Section  . Dyslipidemia   . PNA (pneumonia)     With pleural effusion, requiring thoracentesis  . COPD (chronic obstructive pulmonary disease)   . Gout    Past Surgical History  Procedure Laterality Date  . Cholecystectomy    . Vesicovaginal fistula closure w/  total abdominal hysterectomy    . Bilateral total knee replacements  Rt=5/04 & Lft=1/09    by DrAlusio  . Tonsillectomy    . Appendectomy    . Abdominal hysterectomy    . Breast reduction surgery  1982  . Open splenectomy  [id number]    by DrRosenbower   Family History  Problem Relation Age of Onset  . Diabetes Mother   .  Breast cancer Maternal Aunt    History  Substance Use Topics  . Smoking status: Never Smoker   . Smokeless tobacco: Never Used  . Alcohol Use: No   OB History   Grav Para Term Preterm Abortions TAB SAB Ect Mult Living                 Review of Systems  Gastrointestinal: Positive for nausea, vomiting and diarrhea. Negative for abdominal pain.  All other systems reviewed and are negative.    Allergies  Adhesive; Avelox; Codeine; Guaifenesin; Latex; and Oxycodone  Home Medications   Current Outpatient Rx  Name  Route  Sig  Dispense  Refill  . albuterol (PROVENTIL HFA;VENTOLIN HFA) 108 (90 BASE) MCG/ACT inhaler   Inhalation   Inhale 2 puffs into the lungs every 6 (six) hours as needed. For shortness of breath.   3 each   3   . albuterol (PROVENTIL) (2.5 MG/3ML) 0.083% nebulizer solution   Nebulization   Take 3 mLs (2.5 mg total) by nebulization 4 (four) times daily. Shortness of breath   1080 mL   3   . allopurinol (ZYLOPRIM) 100 MG tablet      take 1 tablet by mouth once daily   90 tablet   0   . amLODipine (NORVASC) 10 MG tablet      take 1  tablet by mouth once daily   90 tablet   3     Refill Approved   . aspirin EC 81 MG tablet   Oral   Take 81 mg by mouth daily.         Marland Kitchen atorvastatin (LIPITOR) 20 MG tablet      take 1 tablet by mouth once daily   90 tablet   0   . cloNIDine (CATAPRES) 0.1 MG tablet      take 1 tablet by mouth twice a day   180 tablet   3   . Dextromethorphan-Guaifenesin (TUSSIN DM) 10-100 MG/5ML liquid   Oral   Take 5 mLs by mouth daily as needed (for cough).         . esomeprazole (NEXIUM) 40 MG capsule   Oral   Take 1 capsule (40 mg total) by mouth 2 (two) times daily.   90 capsule   3   . ferrous sulfate 325 (65 FE) MG tablet   Oral   Take 325 mg by mouth daily with breakfast.         . ferrous sulfate 325 (65 FE) MG tablet      take 1 tablet by mouth once daily with meals   30 tablet   6   .  fluticasone (FLONASE) 50 MCG/ACT nasal spray   Nasal   Place 2 sprays into the nose at bedtime.   48 g   3   . Fluticasone-Salmeterol (ADVAIR) 250-50 MCG/DOSE AEPB   Inhalation   Inhale 1 puff into the lungs 2 (two) times daily.   3 each   3   . furosemide (LASIX) 40 MG tablet      TKAE 1/2 TABLET BY MOUTH DAILY   90 tablet   0   . glucose blood test strip   Other   1 each by Other route as needed for other. Use as instructed         . guaiFENesin (MUCINEX) 600 MG 12 hr tablet   Oral   Take 1,200 mg by mouth daily.         . hydrALAZINE (APRESOLINE) 10 MG tablet      take 1 tablet by mouth three times a day   270 tablet   3   . insulin detemir (LEVEMIR) 100 UNIT/ML injection   Subcutaneous   Inject 70 Units into the skin every morning.          . insulin glulisine (APIDRA SOLOSTAR) 100 UNIT/ML injection      Use 18  units before breakfast and 30 units before lunch and 26  units before dinner         . ketorolac (ACULAR LS) 0.4 % SOLN   Both Eyes   Place 1 drop into both eyes 3 (three) times daily.          . Lancets MISC   Does not apply   1 Units by Does not apply route 4 (four) times daily - after meals and at bedtime.         Marland Kitchen LORazepam (ATIVAN) 1 MG tablet   Oral   Take 1 mg by mouth daily as needed. For anxiety.         . metoprolol tartrate (LOPRESSOR) 25 MG tablet   Oral   Take 0.5 tablets (12.5 mg total) by mouth daily.   45 tablet   3   . predniSONE (DELTASONE) 10 MG tablet      Take as  directed(alternate 10mg , 5 mg, 10 mg, 5 mg)         . pregabalin (LYRICA) 100 MG capsule   Oral   Take 1 capsule (100 mg total) by mouth every evening.   90 capsule   3   . promethazine (PHENERGAN) 25 MG tablet   Oral   Take 1 tablet (25 mg total) by mouth every 6 (six) hours as needed for nausea.   20 tablet   0   . sennosides-docusate sodium (SENOKOT-S) 8.6-50 MG tablet   Oral   Take 1 tablet by mouth at bedtime.          BP  155/72  Pulse 93  Temp(Src) 97.4 F (36.3 C) (Oral)  Resp 16  SpO2 96% Physical Exam  Nursing note and vitals reviewed. Constitutional: She is oriented to person, place, and time. She appears well-developed and well-nourished.  HENT:  Head: Normocephalic.  Right Ear: External ear normal.  Left Ear: External ear normal.  Nose: Nose normal.  Mouth/Throat: Oropharynx is clear and moist.  Eyes: Conjunctivae are normal. Pupils are equal, round, and reactive to light.  Neck: Normal range of motion. Neck supple.  Cardiovascular: Normal rate and normal heart sounds.   Pulmonary/Chest: Effort normal and breath sounds normal.  Abdominal: Soft. There is tenderness.  Musculoskeletal: Normal range of motion.  Neurological: She is oriented to person, place, and time. She has normal reflexes.  Skin: Skin is warm.  Psychiatric: She has a normal mood and affect.    ED Course   Procedures (including critical care time)  Labs Reviewed  CBC WITH DIFFERENTIAL  COMPREHENSIVE METABOLIC PANEL  URINALYSIS, DIPSTICK ONLY   No results found. 1. Diarrhea   2. Vomiting     MDM  Anion gap 11,  Glucose elevated, but no CO2 normal,  No sign of dka.  BUN and creat are in rage of previous values.   I will treat with zofran and lomotil.   Pt advised to ED is symptoms worsen.   See Dr. Kriste Basque for recheck on Monday  Lonia Skinner [place], [place] 03/09/13 1610

## 2013-03-12 NOTE — ED Provider Notes (Signed)
Medical screening examination/treatment/procedure(s) were performed by resident physician or non-physician practitioner and as supervising physician I was immediately available for consultation/collaboration.   KINDL,JAMES DOUGLAS MD.   James D Kindl, MD 03/12/13 1507 

## 2013-03-13 ENCOUNTER — Telehealth: Payer: Self-pay | Admitting: Pulmonary Disease

## 2013-03-13 DIAGNOSIS — R197 Diarrhea, unspecified: Secondary | ICD-10-CM

## 2013-03-13 MED ORDER — METRONIDAZOLE 250 MG PO TABS: 250.0000 mg | ORAL_TABLET | Freq: Three times a day (TID) | ORAL | Status: DC

## 2013-03-13 NOTE — Telephone Encounter (Signed)
Per SN: stop the senokot w/ diarrhea.  Recommend rx for Flagyl 250mg  #21, 1 po TID; yogurt, Align 1 cap QD.  Refer to GI please.  Called spoke with patient, advised of SN's recs as stated above.  Pt verbalized her understanding and denied any questions/concerns.  Pt requests rx be sent to [facility] on [address]  Referral placed to GI.

## 2013-03-13 NOTE — Telephone Encounter (Signed)
Called spoke with patient who reported that she went to UC on 8.8.14 for nausea and diarrhea x5 days at that time Pt was given zofran 4mg , lomotil, and norco prn - she has been taking the zofran and lomotil, but can only tolerate 1/2 norco as 1 whole tab causes dry heaves Pt is taking her nexium BID but also her senokot 2 tabs at bedtime Pt is requesting a referral to GI Advised pt SN is not in the office this afternoon, but will return in the morning.  In the meantime advised pt to advance a bland diet, try a plain yogurt daily, push fluids w/ electrolytes, continue the nexium and hold the senokot.  Pt verbalized her understanding and will seek emergency attention if her symptoms worsen before hearing back from this office.  Pt okay with these recommendations and verbalized her understanding.  Pt okay with call back tomorrow.  Dr Kriste Basque please advise, thank you.

## 2013-03-14 ENCOUNTER — Telehealth: Payer: Self-pay | Admitting: Internal Medicine

## 2013-03-14 NOTE — Telephone Encounter (Signed)
Patient is scheduled to see Mike Gip PA tomorrow 03/15/13 10:00.  Bjorn Loser will notify the patient

## 2013-03-15 ENCOUNTER — Other Ambulatory Visit (INDEPENDENT_AMBULATORY_CARE_PROVIDER_SITE_OTHER): Payer: Medicare Other

## 2013-03-15 ENCOUNTER — Encounter: Payer: Self-pay | Admitting: *Deleted

## 2013-03-15 ENCOUNTER — Ambulatory Visit (INDEPENDENT_AMBULATORY_CARE_PROVIDER_SITE_OTHER): Payer: Medicare Other | Admitting: Physician Assistant

## 2013-03-15 VITALS — BP 160/80 | HR 73 | Ht 62.0 in | Wt 220.0 lb

## 2013-03-15 DIAGNOSIS — K5289 Other specified noninfective gastroenteritis and colitis: Secondary | ICD-10-CM

## 2013-03-15 DIAGNOSIS — K529 Noninfective gastroenteritis and colitis, unspecified: Secondary | ICD-10-CM

## 2013-03-15 DIAGNOSIS — D126 Benign neoplasm of colon, unspecified: Secondary | ICD-10-CM

## 2013-03-15 LAB — BASIC METABOLIC PANEL
CO2: 30 mEq/L (ref 19–32)
Chloride: 101 mEq/L (ref 96–112)
Glucose, Bld: 100 mg/dL — ABNORMAL HIGH (ref 70–99)
Sodium: 137 mEq/L (ref 135–145)

## 2013-03-15 LAB — CBC WITH DIFFERENTIAL/PLATELET
Basophils Absolute: 0.1 10*3/uL (ref 0.0–0.1)
Eosinophils Relative: 1.9 % (ref 0.0–5.0)
HCT: 37.5 % (ref 36.0–46.0)
Hemoglobin: 12.1 g/dL (ref 12.0–15.0)
Lymphocytes Relative: 17.8 % (ref 12.0–46.0)
Monocytes Relative: 9.8 % (ref 3.0–12.0)
Platelets: 427 10*3/uL — ABNORMAL HIGH (ref 150.0–400.0)
RDW: 14.9 % — ABNORMAL HIGH (ref 11.5–14.6)
WBC: 15.6 10*3/uL — ABNORMAL HIGH (ref 4.5–10.5)

## 2013-03-15 MED ORDER — DICYCLOMINE HCL 10 MG PO CAPS: ORAL_CAPSULE | ORAL | Status: DC

## 2013-03-15 NOTE — Patient Instructions (Addendum)
Please go to the basement level to have your labs drawn.  Continue the Zofran as needed for nausea.  Take lomotil only for severe diarrhea. We sent a prescription for Dicyclomine ( Bentyl ) for abdominal cramping , to [facility].   Call Monday 8-18 if you are not feeling significantly better.

## 2013-03-15 NOTE — Progress Notes (Signed)
Subjective:    Patient ID: [name], female    DOB: 22-Nov-1945, 67 y.o.   MRN: [id number]  HPI Kiyona is a pleasant 67 year old Afro-American female known to Dr. Avon Gully also to Dr. Yancey Flemings. She has a history of sarcoidosis and has been maintained on fairly chronic steroids. Also with sleep apnea, chronic respiratory failure coronary artery disease, cerebrovascular disease, diabetes, chronic anxiety. She had upper endoscopy in June of 2011 which was a normal exam and colonoscopy at that same time with removal of 4 polyps which were tubular adenomas and noted to have moderate diverticulosis in the left colon and 1 AVM in the descending colon the She comes in today as a work in with complaints of abdominal pain nausea and diarrhea. She had been to urgent care on August 8 after abrupt onset of rather generalized abdominal pain nausea vomiting and diarrhea while she was at church on Sunday. She says she had multiple episodes of diarrhea which were nonbloody. Has not had any fever or chills that she is aware of. She's not had any known infectious exposures or other family members ill. She's not been on any new medicines or any recent antibiotic. She was given Zofran and Lomotil and Vicodin to use for symptoms. She says actually she has not had any diarrhea in the past 2 days but continues to have abdominal pain. She remains nauseated but has not vomited and is able to keep down by mouth's as long as she has Zofran on board. She still feels weak. Labs from August 8 showed a BBC of 12.5 hemoglobin 13 hematocrit of 40.8 creatinine was 2 which is her baseline    Review of Systems  Constitutional: Positive for appetite change and fatigue.  HENT: Negative.   Eyes: Negative.   Respiratory: Negative.   Gastrointestinal: Positive for nausea, abdominal pain and diarrhea.  Endocrine: Negative.   Genitourinary: Negative.   Musculoskeletal: Negative.   Skin: Negative.   Allergic/Immunologic:  Negative.   Neurological: Negative.   Hematological: Negative.   Psychiatric/Behavioral: Negative.    Outpatient Prescriptions Prior to Visit  Medication Sig Dispense Refill  . albuterol (PROVENTIL HFA;VENTOLIN HFA) 108 (90 BASE) MCG/ACT inhaler Inhale 2 puffs into the lungs every 6 (six) hours as needed. For shortness of breath.  3 each  3  . albuterol (PROVENTIL) (2.5 MG/3ML) 0.083% nebulizer solution Take 3 mLs (2.5 mg total) by nebulization 4 (four) times daily. Shortness of breath  1080 mL  3  . allopurinol (ZYLOPRIM) 100 MG tablet take 1 tablet by mouth once daily  90 tablet  0  . amLODipine (NORVASC) 10 MG tablet take 1 tablet by mouth once daily  90 tablet  3  . aspirin EC 81 MG tablet Take 81 mg by mouth daily.      Marland Kitchen atorvastatin (LIPITOR) 20 MG tablet take 1 tablet by mouth once daily  90 tablet  0  . cloNIDine (CATAPRES) 0.1 MG tablet take 1 tablet by mouth twice a day  180 tablet  3  . Dextromethorphan-Guaifenesin (TUSSIN DM) 10-100 MG/5ML liquid Take 5 mLs by mouth daily as needed (for cough).      . diphenoxylate-atropine (LOMOTIL) 2.5-0.025 MG per tablet Take 1 tablet by mouth 4 (four) times daily as needed for diarrhea or loose stools.  12 tablet  0  . esomeprazole (NEXIUM) 40 MG capsule Take 1 capsule (40 mg total) by mouth 2 (two) times daily.  90 capsule  3  . ferrous sulfate 325 (65  FE) MG tablet Take 325 mg by mouth daily with breakfast.      . fluticasone (FLONASE) 50 MCG/ACT nasal spray Place 2 sprays into the nose at bedtime.  48 g  3  . Fluticasone-Salmeterol (ADVAIR) 250-50 MCG/DOSE AEPB Inhale 1 puff into the lungs 2 (two) times daily.  3 each  3  . furosemide (LASIX) 40 MG tablet TKAE 1/2 TABLET BY MOUTH DAILY  90 tablet  0  . glucose blood test strip 1 each by Other route as needed for other. Use as instructed      . guaiFENesin (MUCINEX) 600 MG 12 hr tablet Take 1,200 mg by mouth daily.      . hydrALAZINE (APRESOLINE) 10 MG tablet take 1 tablet by mouth three  times a day  270 tablet  3  . HYDROcodone-acetaminophen (NORCO/VICODIN) 5-325 MG per tablet Take 2 tablets by mouth every 4 (four) hours as needed for pain.  20 tablet  0  . insulin detemir (LEVEMIR) 100 UNIT/ML injection Inject 70 Units into the skin every morning.       . insulin glulisine (APIDRA SOLOSTAR) 100 UNIT/ML injection Use 18  units before breakfast and 30 units before lunch and 26  units before dinner      . ketorolac (ACULAR LS) 0.4 % SOLN Place 1 drop into both eyes 3 (three) times daily.       . Lancets MISC 1 Units by Does not apply route 4 (four) times daily - after meals and at bedtime.      Marland Kitchen LORazepam (ATIVAN) 1 MG tablet Take 1 mg by mouth daily as needed. For anxiety.      . metoprolol tartrate (LOPRESSOR) 25 MG tablet Take 0.5 tablets (12.5 mg total) by mouth daily.  45 tablet  3  . metroNIDAZOLE (FLAGYL) 250 MG tablet Take 1 tablet (250 mg total) by mouth 3 (three) times daily.  21 tablet  0  . ondansetron (ZOFRAN ODT) 4 MG disintegrating tablet Take 1 tablet (4 mg total) by mouth every 8 (eight) hours as needed for nausea.  20 tablet  0  . predniSONE (DELTASONE) 10 MG tablet Take as directed(alternate 10mg , 5 mg, 10 mg, 5 mg)      . pregabalin (LYRICA) 100 MG capsule Take 1 capsule (100 mg total) by mouth every evening.  90 capsule  3  . sennosides-docusate sodium (SENOKOT-S) 8.6-50 MG tablet Take 1 tablet by mouth at bedtime.      . ferrous sulfate 325 (65 FE) MG tablet take 1 tablet by mouth once daily with meals  30 tablet  6  . promethazine (PHENERGAN) 25 MG tablet Take 1 tablet (25 mg total) by mouth every 6 (six) hours as needed for nausea.  20 tablet  0   No facility-administered medications prior to visit.   Allergies  Allergen Reactions  . Adhesive [Tape] Other (See Comments)    Blisters   . Avelox [Moxifloxacin Hcl In Nacl]     GI upset  . Codeine Other (See Comments)    "crazy"   . Guaifenesin Nausea And Vomiting  . Latex Swelling  . Oxycodone Nausea  And Vomiting   Patient Active Problem List   Diagnosis Date Noted  . Atrial tachycardia 12/26/2012  . Bradycardia 10/21/2012  . Ventricular tachycardia, nonsustained 10/20/2012  . Acute renal failure 10/17/2012  . Back pain, chronic 04/28/2012  . Chronic diastolic heart failure 08/16/2011  . Acute on chronic renal failure 08/12/2011  . Sarcoidosis 11/28/2010  .  HYPERCHOLESTEROLEMIA 07/29/2010  . BRONCHITIS 07/29/2010  . ASTHMA 07/29/2010  . GASTRITIS 07/29/2010  . PANCREATITIS 07/29/2010  . KIDNEY DISEASE 07/29/2010  . SLEEP APNEA 07/29/2010  . CARDIAC MURMUR 07/29/2010  . MONOCLONAL GAMMOPATHY 07/16/2010  . Hypercalcemia 07/15/2010  . OTHER DISEASES OF SPLEEN 07/15/2010  . COLONIC POLYPS 03/12/2010  . RENAL INSUFFICIENCY 03/12/2010  . ARTERIOVENOUS MALFORMATION, COLON 03/12/2010  . NAUSEA AND VOMITING 12/15/2009  . DYSPHAGIA 12/15/2009  . CHANGE IN BOWELS 12/15/2009  . ABDOMINAL PAIN -GENERALIZED 12/15/2009  . ANXIETY 09/22/2008  . OBESITY 01/29/2008  . GOUT, UNSPECIFIED 09/13/2007  . OBSTRUCTIVE SLEEP APNEA 08/14/2007  . CAD 08/14/2007  . CEREBROVASCULAR DISEASE 08/14/2007  . BRONCHITIS, ACUTE 08/14/2007  . DIVERTICULOSIS OF COLON 08/14/2007  . DM 05/31/2007  . HYPERLIPIDEMIA 05/31/2007  . ANEMIA-NOS 05/31/2007  . HYPERTENSION 05/31/2007  . GERD 05/31/2007  . PSORIASIS 05/31/2007  . DEGENERATIVE JOINT DISEASE 05/31/2007   History  Substance Use Topics  . Smoking status: Never Smoker   . Smokeless tobacco: Never Used  . Alcohol Use: No   family history includes Breast cancer in her maternal aunt; Diabetes in her mother.     Objective:   Physical Exam  well-developed obese African American female in no acute distress, pleasant blood pressure 160/80 pulse 73 height 5 foot 2 weight 220. HEENT; nontraumatic normocephalic EOMI PERRLA sclera anicteric, oral mucosa moist, Cardiovascular; regular rate and rhythm with S1-S2 no murmur or gallop, Pulmonary; clear  bilaterally his a few expiratory wheezes, Abdomen; large soft with mild diffuse tenderness no focal tenderness no guarding or rebound no mass or hepatosplenomegaly bowel sounds are active, Rectal ;exam not done, Extremities; no clubbing cyanosis or edema skin warm and dry, Psych; mood and affect normal and appropriate        Assessment & Plan:  #70 67 year old African American female with sarcoidosis on chronic steroids with an acute gastroenteritis manifested by crampy abdominal pain, nausea ,vomiting and diarrhea onset 4 days ago. She is clearly better but has not resolved all of her symptoms . Suspect she has  an acute viral gastroenteritis #2 history of adenomatous colon polyps last colonoscopy 2011 due for followup 2016 #3 diverticulosis #4 diabetes mellitus #5 chronic respiratory failure #6 sleep apnea #7 coronary artery disease  Plan; continue supportive management. Patient is encouraged to rest at home, push oral fluids and gradually advanced from a very bland diet. She will continue Zofran every 6 hours as needed for nausea Continue Vicodin as needed for abdominal pain Lomotil only if recurrent significant diarrhea Will add until 10 mg once or twice daily for a, cramping over the next few days. No further GI intervention warranted at this point she is asked to call back in 3 or 4 days if her symptoms have not significantly improved

## 2013-03-19 NOTE — Progress Notes (Signed)
Agree with initial assessment and plans as outlined 

## 2013-04-02 ENCOUNTER — Encounter (HOSPITAL_COMMUNITY): Payer: Self-pay | Admitting: *Deleted

## 2013-04-02 ENCOUNTER — Inpatient Hospital Stay (HOSPITAL_COMMUNITY)
Admission: EM | Admit: 2013-04-02 | Discharge: 2013-04-10 | DRG: 291 | Disposition: A | Discharge status: Home Health | Payer: Medicare Other | Attending: Internal Medicine | Admitting: Internal Medicine

## 2013-04-02 ENCOUNTER — Emergency Department (HOSPITAL_COMMUNITY): Payer: Medicare Other

## 2013-04-02 DIAGNOSIS — J45909 Unspecified asthma, uncomplicated: Secondary | ICD-10-CM

## 2013-04-02 DIAGNOSIS — Z7982 Long term (current) use of aspirin: Secondary | ICD-10-CM

## 2013-04-02 DIAGNOSIS — I5031 Acute diastolic (congestive) heart failure: Secondary | ICD-10-CM

## 2013-04-02 DIAGNOSIS — R1319 Other dysphagia: Secondary | ICD-10-CM

## 2013-04-02 DIAGNOSIS — K2990 Gastroduodenitis, unspecified, without bleeding: Secondary | ICD-10-CM

## 2013-04-02 DIAGNOSIS — G4730 Sleep apnea, unspecified: Secondary | ICD-10-CM

## 2013-04-02 DIAGNOSIS — D869 Sarcoidosis, unspecified: Secondary | ICD-10-CM

## 2013-04-02 DIAGNOSIS — Z6841 Body Mass Index (BMI) 40.0 and over, adult: Secondary | ICD-10-CM

## 2013-04-02 DIAGNOSIS — I1310 Hypertensive heart and chronic kidney disease without heart failure, with stage 1 through stage 4 chronic kidney disease, or unspecified chronic kidney disease: Secondary | ICD-10-CM

## 2013-04-02 DIAGNOSIS — N179 Acute kidney failure, unspecified: Secondary | ICD-10-CM

## 2013-04-02 DIAGNOSIS — K2970 Gastritis, unspecified, without bleeding: Secondary | ICD-10-CM

## 2013-04-02 DIAGNOSIS — R011 Cardiac murmur, unspecified: Secondary | ICD-10-CM

## 2013-04-02 DIAGNOSIS — R112 Nausea with vomiting, unspecified: Secondary | ICD-10-CM

## 2013-04-02 DIAGNOSIS — I2510 Atherosclerotic heart disease of native coronary artery without angina pectoris: Secondary | ICD-10-CM

## 2013-04-02 DIAGNOSIS — I471 Supraventricular tachycardia: Secondary | ICD-10-CM

## 2013-04-02 DIAGNOSIS — I10 Essential (primary) hypertension: Secondary | ICD-10-CM

## 2013-04-02 DIAGNOSIS — Z9981 Dependence on supplemental oxygen: Secondary | ICD-10-CM

## 2013-04-02 DIAGNOSIS — R001 Bradycardia, unspecified: Secondary | ICD-10-CM

## 2013-04-02 DIAGNOSIS — F411 Generalized anxiety disorder: Secondary | ICD-10-CM

## 2013-04-02 DIAGNOSIS — J441 Chronic obstructive pulmonary disease with (acute) exacerbation: Secondary | ICD-10-CM

## 2013-04-02 DIAGNOSIS — I5032 Chronic diastolic (congestive) heart failure: Secondary | ICD-10-CM

## 2013-04-02 DIAGNOSIS — I4729 Other ventricular tachycardia: Secondary | ICD-10-CM

## 2013-04-02 DIAGNOSIS — E669 Obesity, unspecified: Secondary | ICD-10-CM

## 2013-04-02 DIAGNOSIS — I359 Nonrheumatic aortic valve disorder, unspecified: Secondary | ICD-10-CM | POA: Diagnosis present

## 2013-04-02 DIAGNOSIS — Z794 Long term (current) use of insulin: Secondary | ICD-10-CM

## 2013-04-02 DIAGNOSIS — N184 Chronic kidney disease, stage 4 (severe): Secondary | ICD-10-CM | POA: Diagnosis present

## 2013-04-02 DIAGNOSIS — K5730 Diverticulosis of large intestine without perforation or abscess without bleeding: Secondary | ICD-10-CM

## 2013-04-02 DIAGNOSIS — N259 Disorder resulting from impaired renal tubular function, unspecified: Secondary | ICD-10-CM

## 2013-04-02 DIAGNOSIS — L408 Other psoriasis: Secondary | ICD-10-CM

## 2013-04-02 DIAGNOSIS — D126 Benign neoplasm of colon, unspecified: Secondary | ICD-10-CM

## 2013-04-02 DIAGNOSIS — E7800 Pure hypercholesterolemia, unspecified: Secondary | ICD-10-CM

## 2013-04-02 DIAGNOSIS — I509 Heart failure, unspecified: Secondary | ICD-10-CM

## 2013-04-02 DIAGNOSIS — E785 Hyperlipidemia, unspecified: Secondary | ICD-10-CM

## 2013-04-02 DIAGNOSIS — J449 Chronic obstructive pulmonary disease, unspecified: Secondary | ICD-10-CM | POA: Diagnosis present

## 2013-04-02 DIAGNOSIS — J40 Bronchitis, not specified as acute or chronic: Secondary | ICD-10-CM

## 2013-04-02 DIAGNOSIS — D7389 Other diseases of spleen: Secondary | ICD-10-CM

## 2013-04-02 DIAGNOSIS — I679 Cerebrovascular disease, unspecified: Secondary | ICD-10-CM

## 2013-04-02 DIAGNOSIS — Q2733 Arteriovenous malformation of digestive system vessel: Secondary | ICD-10-CM

## 2013-04-02 DIAGNOSIS — I2789 Other specified pulmonary heart diseases: Secondary | ICD-10-CM | POA: Diagnosis present

## 2013-04-02 DIAGNOSIS — J4489 Other specified chronic obstructive pulmonary disease: Secondary | ICD-10-CM | POA: Diagnosis present

## 2013-04-02 DIAGNOSIS — Z91199 Patient's noncompliance with other medical treatment and regimen due to unspecified reason: Secondary | ICD-10-CM

## 2013-04-02 DIAGNOSIS — I5033 Acute on chronic diastolic (congestive) heart failure: Secondary | ICD-10-CM

## 2013-04-02 DIAGNOSIS — R198 Other specified symptoms and signs involving the digestive system and abdomen: Secondary | ICD-10-CM

## 2013-04-02 DIAGNOSIS — M109 Gout, unspecified: Secondary | ICD-10-CM

## 2013-04-02 DIAGNOSIS — E119 Type 2 diabetes mellitus without complications: Secondary | ICD-10-CM

## 2013-04-02 DIAGNOSIS — I472 Ventricular tachycardia: Secondary | ICD-10-CM

## 2013-04-02 DIAGNOSIS — J9620 Acute and chronic respiratory failure, unspecified whether with hypoxia or hypercapnia: Secondary | ICD-10-CM | POA: Diagnosis present

## 2013-04-02 DIAGNOSIS — N289 Disorder of kidney and ureter, unspecified: Secondary | ICD-10-CM

## 2013-04-02 DIAGNOSIS — Z96659 Presence of unspecified artificial knee joint: Secondary | ICD-10-CM

## 2013-04-02 DIAGNOSIS — I4719 Other supraventricular tachycardia: Secondary | ICD-10-CM

## 2013-04-02 DIAGNOSIS — IMO0002 Reserved for concepts with insufficient information to code with codable children: Secondary | ICD-10-CM

## 2013-04-02 DIAGNOSIS — R1084 Generalized abdominal pain: Secondary | ICD-10-CM

## 2013-04-02 DIAGNOSIS — G4733 Obstructive sleep apnea (adult) (pediatric): Secondary | ICD-10-CM

## 2013-04-02 DIAGNOSIS — T502X5A Adverse effect of carbonic-anhydrase inhibitors, benzothiadiazides and other diuretics, initial encounter: Secondary | ICD-10-CM | POA: Diagnosis not present

## 2013-04-02 DIAGNOSIS — D649 Anemia, unspecified: Secondary | ICD-10-CM

## 2013-04-02 DIAGNOSIS — Z79899 Other long term (current) drug therapy: Secondary | ICD-10-CM

## 2013-04-02 DIAGNOSIS — D472 Monoclonal gammopathy: Secondary | ICD-10-CM

## 2013-04-02 DIAGNOSIS — M1990 Unspecified osteoarthritis, unspecified site: Secondary | ICD-10-CM

## 2013-04-02 DIAGNOSIS — K219 Gastro-esophageal reflux disease without esophagitis: Secondary | ICD-10-CM

## 2013-04-02 DIAGNOSIS — Z9119 Patient's noncompliance with other medical treatment and regimen: Secondary | ICD-10-CM

## 2013-04-02 DIAGNOSIS — G8929 Other chronic pain: Secondary | ICD-10-CM

## 2013-04-02 DIAGNOSIS — J209 Acute bronchitis, unspecified: Secondary | ICD-10-CM

## 2013-04-02 DIAGNOSIS — I130 Hypertensive heart and chronic kidney disease with heart failure and stage 1 through stage 4 chronic kidney disease, or unspecified chronic kidney disease: Principal | ICD-10-CM | POA: Diagnosis present

## 2013-04-02 DIAGNOSIS — K859 Acute pancreatitis without necrosis or infection, unspecified: Secondary | ICD-10-CM

## 2013-04-02 HISTORY — DX: Other intervertebral disc degeneration, lumbar region without mention of lumbar back pain or lower extremity pain: M51.369

## 2013-04-02 HISTORY — DX: Unspecified chronic bronchitis: J42

## 2013-04-02 HISTORY — DX: Other intervertebral disc degeneration, lumbar region: M51.36

## 2013-04-02 HISTORY — DX: Dependence on supplemental oxygen: Z99.81

## 2013-04-02 HISTORY — DX: Spinal stenosis, lumbar region without neurogenic claudication: M48.061

## 2013-04-02 HISTORY — DX: Unspecified osteoarthritis, unspecified site: M19.90

## 2013-04-02 HISTORY — DX: Type 2 diabetes mellitus without complications: E11.9

## 2013-04-02 HISTORY — DX: Other intervertebral disc displacement, lumbar region: M51.26

## 2013-04-02 LAB — CBC
HCT: 35 % — ABNORMAL LOW (ref 36.0–46.0)
HCT: 33.2 % — ABNORMAL LOW (ref 36.0–46.0)
Hemoglobin: 11.2 g/dL — ABNORMAL LOW (ref 12.0–15.0)
Hemoglobin: 11 g/dL — ABNORMAL LOW (ref 12.0–15.0)
MCH: 29.6 pg (ref 26.0–34.0)
MCH: 30.2 pg (ref 26.0–34.0)
MCHC: 32 g/dL (ref 30.0–36.0)
MCV: 92.6 fL (ref 78.0–100.0)
MCV: 91.2 fL (ref 78.0–100.0)
RBC: 3.78 MIL/uL — ABNORMAL LOW (ref 3.87–5.11)
RBC: 3.64 MIL/uL — ABNORMAL LOW (ref 3.87–5.11)

## 2013-04-02 LAB — BASIC METABOLIC PANEL
BUN: 43 mg/dL — ABNORMAL HIGH (ref 6–23)
CO2: 24 mEq/L (ref 19–32)
Calcium: 9 mg/dL (ref 8.4–10.5)
Creatinine, Ser: 2.27 mg/dL — ABNORMAL HIGH (ref 0.50–1.10)
Glucose, Bld: 109 mg/dL — ABNORMAL HIGH (ref 70–99)

## 2013-04-02 LAB — TROPONIN I: Troponin I: <0.3 ng/mL (ref <0.30)

## 2013-04-02 LAB — POCT I-STAT TROPONIN I: Troponin i, poc: 0.05 ng/mL (ref 0.00–0.08)

## 2013-04-02 LAB — CREATININE, SERUM: Creatinine, Ser: 2.32 mg/dL — ABNORMAL HIGH (ref 0.50–1.10)

## 2013-04-02 MED ORDER — METHYLPREDNISOLONE SODIUM SUCC 40 MG IJ SOLR
40.0000 mg | Freq: Four times a day (QID) | INTRAMUSCULAR | Status: DC
  Filled 2013-04-02 (×2): qty 1

## 2013-04-02 MED ORDER — FUROSEMIDE 10 MG/ML IJ SOLN
40.0000 mg | Freq: Every day | INTRAMUSCULAR | Status: DC
  Administered 2013-04-03 – 2013-04-04 (×2): 40 mg via INTRAVENOUS
  Filled 2013-04-02 (×2): qty 4

## 2013-04-02 MED ORDER — PREGABALIN 100 MG PO CAPS
100.0000 mg | ORAL_CAPSULE | Freq: Every evening | ORAL | Status: DC
  Administered 2013-04-02 – 2013-04-09 (×7): 100 mg via ORAL
  Filled 2013-04-02 (×2): qty 1
  Filled 2013-04-02 (×2): qty 4
  Filled 2013-04-02 (×4): qty 1

## 2013-04-02 MED ORDER — ASPIRIN EC 81 MG PO TBEC
81.0000 mg | DELAYED_RELEASE_TABLET | Freq: Every day | ORAL | Status: DC
  Administered 2013-04-03 – 2013-04-10 (×8): 81 mg via ORAL
  Filled 2013-04-02 (×8): qty 1

## 2013-04-02 MED ORDER — ALLOPURINOL 100 MG PO TABS
100.0000 mg | ORAL_TABLET | Freq: Every day | ORAL | Status: DC
  Administered 2013-04-03 – 2013-04-10 (×8): 100 mg via ORAL
  Filled 2013-04-02 (×8): qty 1

## 2013-04-02 MED ORDER — INSULIN ASPART 100 UNIT/ML ~~LOC~~ SOLN
30.0000 [IU] | Freq: Every day | SUBCUTANEOUS | Status: DC
  Administered 2013-04-03: 30 [IU] via SUBCUTANEOUS

## 2013-04-02 MED ORDER — FLUTICASONE PROPIONATE 50 MCG/ACT NA SUSP
2.0000 | Freq: Every day | NASAL | Status: DC
  Administered 2013-04-03 – 2013-04-09 (×6): 2 via NASAL
  Filled 2013-04-02: qty 16

## 2013-04-02 MED ORDER — KETOROLAC TROMETHAMINE 0.5 % OP SOLN
1.0000 [drp] | Freq: Three times a day (TID) | OPHTHALMIC | Status: DC
  Administered 2013-04-02 – 2013-04-10 (×23): 1 [drp] via OPHTHALMIC
  Filled 2013-04-02 (×2): qty 3

## 2013-04-02 MED ORDER — PANTOPRAZOLE SODIUM 40 MG PO TBEC
80.0000 mg | DELAYED_RELEASE_TABLET | Freq: Every day | ORAL | Status: DC
  Administered 2013-04-03 – 2013-04-10 (×8): 80 mg via ORAL
  Filled 2013-04-02 (×10): qty 2

## 2013-04-02 MED ORDER — IPRATROPIUM BROMIDE 0.02 % IN SOLN
0.5000 mg | RESPIRATORY_TRACT | Status: DC
  Administered 2013-04-02 – 2013-04-03 (×3): 0.5 mg via RESPIRATORY_TRACT
  Filled 2013-04-02 (×2): qty 2.5

## 2013-04-02 MED ORDER — INSULIN ASPART 100 UNIT/ML ~~LOC~~ SOLN
0.0000 [IU] | Freq: Every day | SUBCUTANEOUS | Status: DC
  Administered 2013-04-02: 23:00:00 2 [IU] via SUBCUTANEOUS

## 2013-04-02 MED ORDER — CLONIDINE HCL 0.1 MG PO TABS
0.1000 mg | ORAL_TABLET | Freq: Two times a day (BID) | ORAL | Status: DC
  Administered 2013-04-02 – 2013-04-04 (×4): 0.1 mg via ORAL
  Filled 2013-04-02 (×5): qty 1

## 2013-04-02 MED ORDER — ATORVASTATIN CALCIUM 20 MG PO TABS
20.0000 mg | ORAL_TABLET | Freq: Every day | ORAL | Status: DC
  Administered 2013-04-03 – 2013-04-10 (×8): 20 mg via ORAL
  Filled 2013-04-02 (×8): qty 1

## 2013-04-02 MED ORDER — HYDROCODONE-ACETAMINOPHEN 5-325 MG PO TABS
2.0000 | ORAL_TABLET | ORAL | Status: DC | PRN
  Administered 2013-04-06 – 2013-04-07 (×2): 2 via ORAL
  Administered 2013-04-07: 1 via ORAL
  Filled 2013-04-02: qty 2
  Filled 2013-04-02 (×2): qty 1
  Filled 2013-04-02: qty 2

## 2013-04-02 MED ORDER — HEPARIN SODIUM (PORCINE) 5000 UNIT/ML IJ SOLN
5000.0000 [IU] | Freq: Three times a day (TID) | INTRAMUSCULAR | Status: DC
  Administered 2013-04-02 – 2013-04-10 (×23): 5000 [IU] via SUBCUTANEOUS
  Filled 2013-04-02 (×26): qty 1

## 2013-04-02 MED ORDER — SODIUM CHLORIDE 0.9 % IJ SOLN
3.0000 mL | Freq: Two times a day (BID) | INTRAMUSCULAR | Status: DC
  Administered 2013-04-02 – 2013-04-04 (×3): 3 mL via INTRAVENOUS

## 2013-04-02 MED ORDER — ALBUTEROL SULFATE HFA 108 (90 BASE) MCG/ACT IN AERS: 2.0000 | INHALATION_SPRAY | Freq: Four times a day (QID) | RESPIRATORY_TRACT | Status: DC | PRN

## 2013-04-02 MED ORDER — SODIUM CHLORIDE 0.9 % IJ SOLN
3.0000 mL | Freq: Two times a day (BID) | INTRAMUSCULAR | Status: DC
  Administered 2013-04-02 – 2013-04-04 (×4): 3 mL via INTRAVENOUS

## 2013-04-02 MED ORDER — HYDRALAZINE HCL 10 MG PO TABS
10.0000 mg | ORAL_TABLET | Freq: Three times a day (TID) | ORAL | Status: DC
  Administered 2013-04-02 – 2013-04-05 (×10): 10 mg via ORAL
  Filled 2013-04-02 (×13): qty 1

## 2013-04-02 MED ORDER — DICYCLOMINE HCL 10 MG PO CAPS
10.0000 mg | ORAL_CAPSULE | Freq: Three times a day (TID) | ORAL | Status: DC
  Administered 2013-04-02 – 2013-04-10 (×31): 10 mg via ORAL
  Filled 2013-04-02 (×36): qty 1

## 2013-04-02 MED ORDER — INSULIN DETEMIR 100 UNIT/ML ~~LOC~~ SOLN
70.0000 [IU] | Freq: Every morning | SUBCUTANEOUS | Status: DC
  Administered 2013-04-03 – 2013-04-09 (×7): 70 [IU] via SUBCUTANEOUS
  Filled 2013-04-02 (×8): qty 0.7

## 2013-04-02 MED ORDER — INSULIN GLULISINE 100 UNIT/ML ~~LOC~~ SOLN: 18.0000 [IU] | Freq: Three times a day (TID) | SUBCUTANEOUS | Status: DC

## 2013-04-02 MED ORDER — MOMETASONE FURO-FORMOTEROL FUM 100-5 MCG/ACT IN AERO
2.0000 | INHALATION_SPRAY | Freq: Two times a day (BID) | RESPIRATORY_TRACT | Status: DC
  Administered 2013-04-03 – 2013-04-10 (×15): 2 via RESPIRATORY_TRACT
  Filled 2013-04-02 (×2): qty 8.8

## 2013-04-02 MED ORDER — SODIUM CHLORIDE 0.9 % IV SOLN: 250.0000 mL | INTRAVENOUS | Status: DC | PRN

## 2013-04-02 MED ORDER — ALBUTEROL SULFATE (5 MG/ML) 0.5% IN NEBU
2.5000 mg | INHALATION_SOLUTION | RESPIRATORY_TRACT | Status: DC
  Administered 2013-04-02 – 2013-04-03 (×3): 2.5 mg via RESPIRATORY_TRACT
  Filled 2013-04-02 (×2): qty 0.5

## 2013-04-02 MED ORDER — INSULIN ASPART 100 UNIT/ML ~~LOC~~ SOLN
18.0000 [IU] | Freq: Every day | SUBCUTANEOUS | Status: DC
  Administered 2013-04-03: 08:00:00 18 [IU] via SUBCUTANEOUS

## 2013-04-02 MED ORDER — METOPROLOL TARTRATE 12.5 MG HALF TABLET
12.5000 mg | ORAL_TABLET | Freq: Every day | ORAL | Status: DC
  Administered 2013-04-03 – 2013-04-10 (×8): 12.5 mg via ORAL
  Filled 2013-04-02 (×8): qty 1

## 2013-04-02 MED ORDER — ALBUTEROL SULFATE (5 MG/ML) 0.5% IN NEBU: 2.5000 mg | INHALATION_SOLUTION | RESPIRATORY_TRACT | Status: DC

## 2013-04-02 MED ORDER — SODIUM CHLORIDE 0.9 % IJ SOLN: 3.0000 mL | INTRAMUSCULAR | Status: DC | PRN

## 2013-04-02 MED ORDER — INSULIN ASPART 100 UNIT/ML ~~LOC~~ SOLN
0.0000 [IU] | Freq: Three times a day (TID) | SUBCUTANEOUS | Status: DC
  Administered 2013-04-03: 12:00:00 4 [IU] via SUBCUTANEOUS
  Administered 2013-04-04: 20 [IU] via SUBCUTANEOUS
  Administered 2013-04-04: 15 [IU] via SUBCUTANEOUS
  Administered 2013-04-05: 18:00:00 3 [IU] via SUBCUTANEOUS
  Administered 2013-04-06: 11 [IU] via SUBCUTANEOUS
  Administered 2013-04-06: 07:00:00 3 [IU] via SUBCUTANEOUS
  Administered 2013-04-07: 13:00:00 11 [IU] via SUBCUTANEOUS
  Administered 2013-04-07: 4 [IU] via SUBCUTANEOUS
  Administered 2013-04-07: 18:00:00 7 [IU] via SUBCUTANEOUS
  Administered 2013-04-08: 17:00:00 4 [IU] via SUBCUTANEOUS
  Administered 2013-04-08: 11 [IU] via SUBCUTANEOUS
  Administered 2013-04-08: 06:00:00 8 [IU] via SUBCUTANEOUS
  Administered 2013-04-09: 12:00:00 7 [IU] via SUBCUTANEOUS
  Administered 2013-04-09 (×2): 11 [IU] via SUBCUTANEOUS
  Administered 2013-04-10 (×2): 7 [IU] via SUBCUTANEOUS

## 2013-04-02 MED ORDER — AMLODIPINE BESYLATE 10 MG PO TABS
10.0000 mg | ORAL_TABLET | Freq: Every day | ORAL | Status: DC
  Administered 2013-04-03 – 2013-04-08 (×6): 10 mg via ORAL
  Filled 2013-04-02 (×7): qty 1

## 2013-04-02 MED ORDER — INSULIN ASPART 100 UNIT/ML ~~LOC~~ SOLN: 26.0000 [IU] | Freq: Every day | SUBCUTANEOUS | Status: DC

## 2013-04-02 MED ORDER — DEXTROSE 5 % IV SOLN
500.0000 mg | INTRAVENOUS | Status: DC
  Administered 2013-04-03 (×2): 500 mg via INTRAVENOUS
  Filled 2013-04-02 (×4): qty 500

## 2013-04-02 MED ORDER — FUROSEMIDE 10 MG/ML IJ SOLN
40.0000 mg | Freq: Once | INTRAMUSCULAR | Status: AC
  Administered 2013-04-02: 40 mg via INTRAVENOUS
  Filled 2013-04-02: qty 4

## 2013-04-02 MED ORDER — ONDANSETRON 4 MG PO TBDP
4.0000 mg | ORAL_TABLET | Freq: Three times a day (TID) | ORAL | Status: DC | PRN
  Administered 2013-04-07: 11:00:00 4 mg via ORAL
  Filled 2013-04-02 (×2): qty 1

## 2013-04-02 NOTE — ED Notes (Signed)
Pt c/o worsening SOB x 2 weeks.  Pt sts " I have gained 10 pounds in two weeks."  Pt c/o swelling in feet, ankles, and abdomen.  + 1 pitting edema noted in feet and ankles.  Pt reports she has needed to use 2L oxygen at home.  Pt is 98% on room air.  Lung sounds clear bilaterally.

## 2013-04-02 NOTE — H&P (Signed)
[facility] History and Physical  Shanzay A Teodoro Kil AOZ:[id number] DOB: 1946/08/01    PCP:   Michele Mcalpine, MD   Chief Complaint: progressive shortness of breath over the past week.  HPI: [name] is an 67 y.o. female with hx of sleep apnea (noncompliant with CPAP), morbid obesity, asthma and COPD, chronic steroid use for sarcoidosis, chronic diastolic CHF, CAD, DM, GERD, hyperlipidemia, HTN, followed by Dr Kriste Basque for pulmonary and Dr Daleen Squibb for cardiology, presents to the ER with at least four days of increase DOE, orthopnea, and bilateral pedal edema.  She also has increase wheezing and has clear sputum productive coughs.  Evalaution in the ER included a BNP of 2600, CXR with vascular congestion, and WBC of 13.5K.  Her EKG showed bradycardia, with QTc of , but no acute ischemic changes.  She has CKD with current Cr of 2.2. She has been on home oxygen and has been on steroids for years. She was given IV Lasix, and hospitalist was asked to admit her for CHF.  She denied fever, chills, abdominal pain, nausea, vomiting or diaphoresis.  Rewiew of Systems:  Constitutional: Negative for malaise, fever and chills. No significant weight loss or weight gain Eyes: Negative for eye pain, redness and discharge, diplopia, visual changes, or flashes of light. ENMT: Negative for ear pain, hoarseness, nasal congestion, sinus pressure and sore throat. No headaches; tinnitus, drooling, or problem swallowing. Cardiovascular: Negative for chest pain, palpitations, diaphoresis, dyspnea, PND Respiratory: Negative for cough, hemoptysis, wheezing and stridor. No pleuritic chestpain. Gastrointestinal: Negative for nausea, vomiting, diarrhea, constipation, abdominal pain, melena, blood in stool, hematemesis, jaundice and rectal bleeding.    Genitourinary: Negative for frequency, dysuria, incontinence,flank pain and hematuria; Musculoskeletal: Negative for back pain and neck pain. Negative for  swelling and trauma.;  Skin: . Negative for pruritus, rash, abrasions, bruising and skin lesion.; ulcerations Neuro: Negative for headache, lightheadedness and neck stiffness. Negative for weakness, altered level of consciousness , altered mental status, extremity weakness, burning feet, involuntary movement, seizure and syncope.  Psych: negative for anxiety, depression, insomnia, tearfulness, panic attacks, hallucinations, paranoia, suicidal or homicidal ideation.   Past Medical History  Diagnosis Date  . Unspecified essential hypertension   . CAD (coronary artery disease) 01/04/2006    Tiny OM1 70-90% ostial stenosis, no other CAD  . Asthma   . OSA (obstructive sleep apnea)     marginally compliant with CPAP  . Esophageal reflux   . Obesity, unspecified   . Degenerative joint disease   . Anxiety state, unspecified   . Hypercalcemia   . Anemia   . Other psoriasis   . Diverticulosis   . Sarcoidosis 2012  . Diabetes mellitus   . Kidney disease     baseline creatinie 2.0,  folowed by Dr Caryn Section  . Dyslipidemia   . PNA (pneumonia)     With pleural effusion, requiring thoracentesis  . COPD (chronic obstructive pulmonary disease)   . Gout   . Colon polyps     Tubular adenomatous polyps    Past Surgical History  Procedure Laterality Date  . Cholecystectomy    . Vesicovaginal fistula closure w/  total abdominal hysterectomy    . Bilateral total knee replacements  Rt=5/04 & Lft=1/09    by DrAlusio  . Tonsillectomy    . Appendectomy    . Abdominal hysterectomy    . Breast reduction surgery  1982  . Open splenectomy  [id number]    by DrRosenbower    Medications:  HOME MEDS:  Prior to Admission medications   Medication Sig Start Date End Date Taking? Authorizing Provider  albuterol (PROVENTIL HFA;VENTOLIN HFA) 108 (90 BASE) MCG/ACT inhaler Inhale 2 puffs into the lungs every 6 (six) hours as needed for wheezing or shortness of breath.   Yes Historical Provider, MD  albuterol  (PROVENTIL) (2.5 MG/3ML) 0.083% nebulizer solution Take 2.5 mg by nebulization every 4 (four) hours as needed for wheezing.   Yes Historical Provider, MD  allopurinol (ZYLOPRIM) 100 MG tablet Take 100 mg by mouth daily.   Yes Historical Provider, MD  amLODipine (NORVASC) 10 MG tablet Take 10 mg by mouth daily.   Yes Historical Provider, MD  aspirin EC 81 MG tablet Take 81 mg by mouth daily.   Yes Historical Provider, MD  atorvastatin (LIPITOR) 20 MG tablet Take 20 mg by mouth daily.   Yes Historical Provider, MD  cloNIDine (CATAPRES) 0.1 MG tablet Take 0.1 mg by mouth 2 (two) times daily.   Yes Historical Provider, MD  Dextromethorphan-Guaifenesin (TUSSIN DM) 10-100 MG/5ML liquid Take 5 mLs by mouth daily as needed (cough).    Yes Historical Provider, MD  dicyclomine (BENTYL) 10 MG capsule Take 10 mg by mouth 4 (four) times daily -  before meals and at bedtime.   Yes Historical Provider, MD  diphenoxylate-atropine (LOMOTIL) 2.5-0.025 MG per tablet Take 1 tablet by mouth 4 (four) times daily as needed for diarrhea or loose stools. 03/09/13  Yes Lonia Skinner Sofia, PA-C  esomeprazole (NEXIUM) 40 MG capsule Take 1 capsule (40 mg total) by mouth 2 (two) times daily. 02/14/13  Yes Michele Mcalpine, MD  ferrous sulfate 325 (65 FE) MG tablet Take 325 mg by mouth daily with breakfast.   Yes Historical Provider, MD  fluticasone (FLONASE) 50 MCG/ACT nasal spray Place 2 sprays into the nose at bedtime. 02/14/13  Yes Michele Mcalpine, MD  Fluticasone-Salmeterol (ADVAIR) 250-50 MCG/DOSE AEPB Inhale 1 puff into the lungs 2 (two) times daily. 02/14/13  Yes Michele Mcalpine, MD  furosemide (LASIX) 40 MG tablet Take 20 mg by mouth daily.   Yes Historical Provider, MD  glucose blood test strip 1 each by Other route as needed for other. Use as instructed   Yes Historical Provider, MD  guaiFENesin (MUCINEX) 600 MG 12 hr tablet Take 1,200 mg by mouth daily.   Yes Historical Provider, MD  hydrALAZINE (APRESOLINE) 10 MG tablet Take 10 mg by  mouth 3 (three) times daily.   Yes Historical Provider, MD  HYDROcodone-acetaminophen (NORCO/VICODIN) 5-325 MG per tablet Take 2 tablets by mouth every 4 (four) hours as needed for pain. 03/09/13  Yes Lonia Skinner Sofia, PA-C  insulin detemir (LEVEMIR) 100 UNIT/ML injection Inject 70 Units into the skin every morning.    Yes Historical Provider, MD  insulin glulisine (APIDRA SOLOSTAR) 100 UNIT/ML injection Inject 18-30 Units into the skin 3 (three) times daily before meals. Use 18  units before breakfast and 30 units before lunch and 26  units before dinner   Yes Historical Provider, MD  ketorolac (ACULAR LS) 0.4 % SOLN Place 1 drop into both eyes 3 (three) times daily.    Yes Historical Provider, MD  Lancets MISC 1 Units by Does not apply route 4 (four) times daily - after meals and at bedtime.   Yes Historical Provider, MD  LORazepam (ATIVAN) 1 MG tablet Take 1 mg by mouth daily as needed.    Yes Historical Provider, MD  metoprolol tartrate (LOPRESSOR) 25 MG tablet Take 0.5 tablets (12.5  mg total) by mouth daily. 02/14/13  Yes Michele Mcalpine, MD  ondansetron (ZOFRAN ODT) 4 MG disintegrating tablet Take 1 tablet (4 mg total) by mouth every 8 (eight) hours as needed for nausea. 03/09/13  Yes Lonia Skinner Sofia, PA-C  pregabalin (LYRICA) 100 MG capsule Take 1 capsule (100 mg total) by mouth every evening. 02/14/13  Yes Michele Mcalpine, MD  sennosides-docusate sodium (SENOKOT-S) 8.6-50 MG tablet Take 1 tablet by mouth at bedtime.   Yes Historical Provider, MD     Allergies:  Allergies  Allergen Reactions  . Adhesive [Tape] Other (See Comments)    Blisters   . Avelox [Moxifloxacin Hcl In Nacl]     GI upset  . Codeine Other (See Comments)    "crazy"   . Guaifenesin Nausea And Vomiting  . Latex Swelling  . Oxycodone Nausea And Vomiting    Social History:   reports that she has never smoked. She has never used smokeless tobacco. She reports that she does not drink alcohol or use illicit drugs.  Family  History: Family History  Problem Relation Age of Onset  . Diabetes Mother   . Breast cancer Maternal Aunt      Physical Exam: Filed Vitals:   04/02/13 1916 04/02/13 1930 04/02/13 2000 04/02/13 2010  BP: 160/66 136/65 129/62   Pulse: 72 63 62   Temp: 98.2 F (36.8 C)     TempSrc: Oral     Resp: 25 22 16    Height:      Weight:      SpO2: 88% 99% 100% 100%   Blood pressure 129/62, pulse 62, temperature 98.2 F (36.8 C), temperature source Oral, resp. rate 16, height 5' 2.5" (1.588 m), weight 104.645 kg (230 lb 11.2 oz), SpO2 100.00%.  GEN:  Pleasant  patient lying in the stretcher in no acute distress; cooperative with exam. PSYCH:  alert and oriented x4; does not appear anxious or depressed; affect is appropriate. HEENT: Mucous membranes pink and anicteric; PERRLA; EOM intact; no cervical lymphadenopathy nor thyromegaly or carotid bruit; no JVD; There were no stridor. Neck is very supple. Breasts:: Not examined CHEST WALL: No tenderness CHEST: Decrease BS throughout, with wheezing diffusely.  No rales.Marland Kitchen  HEART: Regular rate and rhythm.  There are no murmur, rub, or gallops.   BACK: No kyphosis or scoliosis; no CVA tenderness ABDOMEN: soft and non-tender; no masses, no organomegaly, normal abdominal bowel sounds; no pannus; no intertriginous candida. There is no rebound and no distention. Rectal Exam: Not done EXTREMITIES: No bone or joint deformity; age-appropriate arthropathy of the hands and knees; 2+ edema bilaterally. no ulcerations.  There is no calf tenderness. Genitalia: not examined PULSES: 2+ and symmetric SKIN: Normal hydration no rash or ulceration CNS: Cranial nerves 2-12 grossly intact no focal lateralizing neurologic deficit.  Speech is fluent; uvula elevated with phonation, facial symmetry and tongue midline. DTR are normal bilaterally, cerebella exam is intact, barbinski is negative and strengths are equaled bilaterally.  No sensory loss.   Labs on Admission:   Basic Metabolic Panel:  Recent Labs Lab 04/02/13 1636  NA 138  K 4.2  CL 102  CO2 24  GLUCOSE 109*  BUN 43*  CREATININE 2.27*  CALCIUM 9.0   Liver Function Tests: No results found for this basename: AST, ALT, ALKPHOS, BILITOT, PROT, ALBUMIN,  in the last 168 hours No results found for this basename: LIPASE, AMYLASE,  in the last 168 hours No results found for this basename: AMMONIA,  in  the last 168 hours CBC:  Recent Labs Lab 04/02/13 1636  WBC 13.5*  HGB 11.2*  HCT 35.0*  MCV 92.6  PLT 380   Cardiac Enzymes: No results found for this basename: CKTOTAL, CKMB, CKMBINDEX, TROPONINI,  in the last 168 hours  CBG:  Recent Labs Lab 04/02/13 1757  GLUCAP 139*     Radiological Exams on Admission: Dg Chest 2 View  04/02/2013   *RADIOLOGY REPORT*  Clinical Data: Shortness of breath  CHEST - 2 VIEW  Comparison: 10/17/2012  Findings: Cardiomegaly and pulmonary vascular congestion noted. Mild elevation of the right hemidiaphragm is again noted. There is no evidence of focal airspace disease, pulmonary edema, suspicious pulmonary nodule/mass, pleural effusion, or pneumothorax. No acute bony abnormalities are identified.  IMPRESSION: Cardiomegaly with pulmonary vascular congestion.   Original Report Authenticated By: Harmon Pier, M.D.    EKG: Independently reviewed. Sbrady.  QTc 450 ms. No acute ischemic changes.   Assessment/Plan Present on Admission:  . Sarcoidosis . BRONCHITIS, ACUTE . ASTHMA . CAD . DM . GERD . HYPERCHOLESTEROLEMIA . HYPERLIPIDEMIA . HYPERTENSION . OBESITY . OBSTRUCTIVE SLEEP APNEA . RENAL INSUFFICIENCY  PLAN:  Her SOB is likely multifactorial including COPD exacerbation, diastolic CHF, deconditioning, and I suspect pulmonary hypertension as well.  Her edema may be fluid related, or the side effect of Norvasc and clonidine.  Will admit her and start on IV steroids, oxygen, neb and IV Zithromax.  Will diurese her as well.   I have continued her  home meds, except will hold the ativan.  She is agreeable to wear her CPAP tonight in the hospital, but she has been very noncompliant with it at home.  Will obtain an ECHO to evaluate any WMA and for EF.  I have continued her nebs tx.  She is stable, full code, and will be admitted to [facility] service.  Thank you for allowing me to participate in her care.  Other plans as per orders.  Code Status: FULL Unk Lightning, MD. [facility] Pager [phone] 7pm to 7am.  04/02/2013, 8:48 PM

## 2013-04-02 NOTE — ED Provider Notes (Signed)
CSN: [id number]     Arrival date & time 04/02/13  1554 History   First MD Initiated Contact with Patient 04/02/13 1621     Chief Complaint  Patient presents with  . Shortness of Breath  . Weakness   (Consider location/radiation/quality/duration/timing/severity/associated sxs/prior Treatment) Patient is a 67 y.o. female presenting with shortness of breath. The history is provided by the patient.  Shortness of Breath Severity:  Mild Onset quality:  Gradual Duration:  1 week Timing:  Constant Progression:  Worsening Chronicity:  New Context: not smoke exposure and not URI   Relieved by:  Sitting up Ineffective treatments:  Diuretics Associated symptoms: cough   Associated symptoms: no abdominal pain, no chest pain, no diaphoresis, no headaches, no rash and no vomiting   Risk factors: obesity   Risk factors: no tobacco use     Past Medical History  Diagnosis Date  . Unspecified essential hypertension   . CAD (coronary artery disease) 01/04/2006    Tiny OM1 70-90% ostial stenosis, no other CAD  . Asthma   . OSA (obstructive sleep apnea)     marginally compliant with CPAP  . Esophageal reflux   . Obesity, unspecified   . Degenerative joint disease   . Anxiety state, unspecified   . Hypercalcemia   . Anemia   . Other psoriasis   . Diverticulosis   . Sarcoidosis 2012  . Diabetes mellitus   . Kidney disease     baseline creatinie 2.0,  folowed by Dr Caryn Section  . Dyslipidemia   . PNA (pneumonia)     With pleural effusion, requiring thoracentesis  . COPD (chronic obstructive pulmonary disease)   . Gout   . Colon polyps     Tubular adenomatous polyps   Past Surgical History  Procedure Laterality Date  . Cholecystectomy    . Vesicovaginal fistula closure w/  total abdominal hysterectomy    . Bilateral total knee replacements  Rt=5/04 & Lft=1/09    by DrAlusio  . Tonsillectomy    . Appendectomy    . Abdominal hysterectomy    . Breast reduction surgery  1982  . 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 Diabetes Mother   . Breast cancer Maternal Aunt    History  Substance Use Topics  . Smoking status: Never Smoker   . Smokeless tobacco: Never Used  . Alcohol Use: No   OB History   Grav Para Term Preterm Abortions TAB SAB Ect Mult Living                 Review of Systems  Constitutional: Negative for chills and diaphoresis.  HENT: Negative for trouble swallowing and voice change.   Respiratory: Positive for cough and shortness of breath.   Cardiovascular: Positive for leg swelling. Negative for chest pain and palpitations.  Gastrointestinal: Negative for nausea, vomiting, abdominal pain and diarrhea.  Endocrine: Negative for polydipsia and polyphagia.  Genitourinary: Negative for dysuria and frequency.  Musculoskeletal: Negative for back pain and joint swelling.  Skin: Negative for rash and wound.  Neurological: Negative for dizziness, syncope, light-headedness and headaches.  Hematological: Negative for adenopathy. Does not bruise/bleed easily.  All other systems reviewed and are negative.    Allergies  Adhesive; Avelox; Codeine; Guaifenesin; Latex; and Oxycodone  Home Medications   Current Outpatient Rx  Name  Route  Sig  Dispense  Refill  . albuterol (PROVENTIL HFA;VENTOLIN HFA) 108 (90 BASE)  MCG/ACT inhaler   Inhalation   Inhale 2 puffs into the lungs every 6 (six) hours as needed. For shortness of breath.   3 each   3   . albuterol (PROVENTIL) (2.5 MG/3ML) 0.083% nebulizer solution   Nebulization   Take 3 mLs (2.5 mg total) by nebulization 4 (four) times daily. Shortness of breath   1080 mL   3   . allopurinol (ZYLOPRIM) 100 MG tablet      take 1 tablet by mouth once daily   90 tablet   0   . amLODipine (NORVASC) 10 MG tablet      take 1 tablet by mouth once daily   90 tablet   3     Refill Approved   . aspirin EC 81 MG tablet   Oral   Take 81 mg by  mouth daily.         Marland Kitchen atorvastatin (LIPITOR) 20 MG tablet      take 1 tablet by mouth once daily   90 tablet   0   . cloNIDine (CATAPRES) 0.1 MG tablet      take 1 tablet by mouth twice a day   180 tablet   3   . Dextromethorphan-Guaifenesin (TUSSIN DM) 10-100 MG/5ML liquid   Oral   Take 5 mLs by mouth daily as needed (for cough).         . dicyclomine (BENTYL) 10 MG capsule      Take 1 tab 3 times daily as needed for cramping.   90 capsule   1   . diphenoxylate-atropine (LOMOTIL) 2.5-0.025 MG per tablet   Oral   Take 1 tablet by mouth 4 (four) times daily as needed for diarrhea or loose stools.   12 tablet   0   . esomeprazole (NEXIUM) 40 MG capsule   Oral   Take 1 capsule (40 mg total) by mouth 2 (two) times daily.   90 capsule   3   . ferrous sulfate 325 (65 FE) MG tablet   Oral   Take 325 mg by mouth daily with breakfast.         . fluticasone (FLONASE) 50 MCG/ACT nasal spray   Nasal   Place 2 sprays into the nose at bedtime.   48 g   3   . Fluticasone-Salmeterol (ADVAIR) 250-50 MCG/DOSE AEPB   Inhalation   Inhale 1 puff into the lungs 2 (two) times daily.   3 each   3   . furosemide (LASIX) 40 MG tablet      TKAE 1/2 TABLET BY MOUTH DAILY   90 tablet   0   . glucose blood test strip   Other   1 each by Other route as needed for other. Use as instructed         . guaiFENesin (MUCINEX) 600 MG 12 hr tablet   Oral   Take 1,200 mg by mouth daily.         . hydrALAZINE (APRESOLINE) 10 MG tablet      take 1 tablet by mouth three times a day   270 tablet   3   . HYDROcodone-acetaminophen (NORCO/VICODIN) 5-325 MG per tablet   Oral   Take 2 tablets by mouth every 4 (four) hours as needed for pain.   20 tablet   0   . insulin detemir (LEVEMIR) 100 UNIT/ML injection   Subcutaneous   Inject 70 Units into the skin every morning.          Marland Kitchen  insulin glulisine (APIDRA SOLOSTAR) 100 UNIT/ML injection      Use 18  units before  breakfast and 30 units before lunch and 26  units before dinner         . ketorolac (ACULAR LS) 0.4 % SOLN   Both Eyes   Place 1 drop into both eyes 3 (three) times daily.          . Lancets MISC   Does not apply   1 Units by Does not apply route 4 (four) times daily - after meals and at bedtime.         Marland Kitchen LORazepam (ATIVAN) 1 MG tablet   Oral   Take 1 mg by mouth daily as needed. For anxiety.         . metoprolol tartrate (LOPRESSOR) 25 MG tablet   Oral   Take 0.5 tablets (12.5 mg total) by mouth daily.   45 tablet   3   . metroNIDAZOLE (FLAGYL) 250 MG tablet   Oral   Take 1 tablet (250 mg total) by mouth 3 (three) times daily.   21 tablet   0   . ondansetron (ZOFRAN ODT) 4 MG disintegrating tablet   Oral   Take 1 tablet (4 mg total) by mouth every 8 (eight) hours as needed for nausea.   20 tablet   0   . predniSONE (DELTASONE) 10 MG tablet      Take as directed(alternate 10mg , 5 mg, 10 mg, 5 mg)         . pregabalin (LYRICA) 100 MG capsule   Oral   Take 1 capsule (100 mg total) by mouth every evening.   90 capsule   3   . sennosides-docusate sodium (SENOKOT-S) 8.6-50 MG tablet   Oral   Take 1 tablet by mouth at bedtime.          BP 138/85  Pulse 69  Temp(Src) 98.3 F (36.8 C) (Oral)  Resp 18  Ht 5' 2.5" (1.588 m)  Wt 230 lb 11.2 oz (104.645 kg)  BMI 41.5 kg/m2  SpO2 98% Physical Exam  Vitals reviewed. Constitutional: She is oriented to person, place, and time. She appears well-developed and well-nourished. No distress.  HENT:  Right Ear: External ear normal.  Left Ear: External ear normal.  Mouth/Throat: No oropharyngeal exudate.  Eyes: Conjunctivae and EOM are normal.  Neck: Normal range of motion. Neck supple.  Cardiovascular: Normal rate, regular rhythm, normal heart sounds and intact distal pulses.  Exam reveals no gallop and no friction rub.   No murmur heard. Pulmonary/Chest: Effort normal. She has wheezes (intermittent). She has  rales (bilateral bases).  Abdominal: Soft. Bowel sounds are normal. She exhibits no distension. There is no tenderness. There is no rebound.  Musculoskeletal: Normal range of motion. She exhibits no edema.  Neurological: She is alert and oriented to person, place, and time. No cranial nerve deficit.  Skin: Skin is warm and dry. No rash noted. She is not diaphoretic.  Psychiatric: She has a normal mood and affect.    ED Course  Procedures (including critical care time) Labs Review Labs Reviewed  CBC - Abnormal; Notable for the following:    WBC 13.5 (*)    RBC 3.78 (*)    Hemoglobin 11.2 (*)    HCT 35.0 (*)    All other components within normal limits  BASIC METABOLIC PANEL - Abnormal; Notable for the following:    Glucose, Bld 109 (*)    BUN 43 (*)  Creatinine, Ser 2.27 (*)    GFR calc non Af Amer 21 (*)    GFR calc Af Amer 25 (*)    All other components within normal limits  PRO B NATRIURETIC PEPTIDE - Abnormal; Notable for the following:    Pro B Natriuretic peptide (BNP) 2612.0 (*)    All other components within normal limits  GLUCOSE, CAPILLARY - Abnormal; Notable for the following:    Glucose-Capillary 139 (*)    All other components within normal limits  POCT I-STAT TROPONIN I   Imaging Review Dg Chest 2 View  04/02/2013   *RADIOLOGY REPORT*  Clinical Data: Shortness of breath  CHEST - 2 VIEW  Comparison: 10/17/2012  Findings: Cardiomegaly and pulmonary vascular congestion noted. Mild elevation of the right hemidiaphragm is again noted. There is no evidence of focal airspace disease, pulmonary edema, suspicious pulmonary nodule/mass, pleural effusion, or pneumothorax. No acute bony abnormalities are identified.  IMPRESSION: Cardiomegaly with pulmonary vascular congestion.   Original Report Authenticated By: Harmon Pier, M.D.    Date: 04/02/2013  Rate: 65  Rhythm: normal sinus rhythm  QRS Axis: normal  Intervals: normal  ST/T Wave abnormalities: normal  Conduction  Disutrbances:none  Narrative Interpretation:   Old EKG Reviewed: no significant change from EKG 10/23/12    MDM    65 y F with PMH of IDDM, CKD, COPD (2L at baseline), Asthma, CAD here with SOB, leg edema, and weight gain (7 lbs) over the past week.  Sleeps sitting upright.  No fevers, productive cough, diarrhea, vomiting, chest pain.  Afebrile.  VSS.  Crackles in bases, scattered wheezes.  2+ edema bilaterally.    Diff Dx: CHF exac, COPD exac, ACS, PNA, AKI on CKD.  CBC, BMP, bnp, CXR, duoneb, ekg.  6:34 PM 40 mg Lasix given.  Medicine to admit.  Clinical Impression: 1. CHF (congestive heart failure)   2. Chronic diastolic heart failure   3. Chronic use of steroids   4. COPD exacerbation     Disposition: Admit  Condition: Fair   I have discussed the results, Dx and Tx plan. They understand and agree with plan for admission.  Exam unchanged at admission.   Pt seen in conjunction with Dr. Manus Gunning.  Reine Just. Beverely Pace, MD Emergency Medicine PGY-III [phone]   Oleh Genin, MD 04/03/13 [phone]

## 2013-04-02 NOTE — ED Notes (Signed)
Pt with hx of sarcoidosis and copd to ED c/o sob for several weeks.  States her pulmonologist told her to come here if sob increased.  Sob increases with activity.  C/o bil LE edema.  Denies chest pain.

## 2013-04-02 NOTE — ED Notes (Signed)
Patient transported to X-ray 

## 2013-04-03 DIAGNOSIS — N189 Chronic kidney disease, unspecified: Secondary | ICD-10-CM

## 2013-04-03 DIAGNOSIS — I359 Nonrheumatic aortic valve disorder, unspecified: Secondary | ICD-10-CM

## 2013-04-03 DIAGNOSIS — J209 Acute bronchitis, unspecified: Secondary | ICD-10-CM

## 2013-04-03 DIAGNOSIS — N179 Acute kidney failure, unspecified: Secondary | ICD-10-CM

## 2013-04-03 LAB — CBC
MCH: 29.6 pg (ref 26.0–34.0)
Platelets: 360 10*3/uL (ref 150–400)
RBC: 3.65 MIL/uL — ABNORMAL LOW (ref 3.87–5.11)

## 2013-04-03 LAB — BASIC METABOLIC PANEL
Calcium: 9 mg/dL (ref 8.4–10.5)
GFR calc non Af Amer: 21 mL/min — ABNORMAL LOW (ref >90)
Glucose, Bld: 164 mg/dL — ABNORMAL HIGH (ref 70–99)
Sodium: 137 mEq/L (ref 135–145)

## 2013-04-03 LAB — GLUCOSE, CAPILLARY
Glucose-Capillary: 175 mg/dL — ABNORMAL HIGH (ref 70–99)
Glucose-Capillary: 231 mg/dL — ABNORMAL HIGH (ref 70–99)
Glucose-Capillary: 50 mg/dL — ABNORMAL LOW (ref 70–99)

## 2013-04-03 LAB — TROPONIN I: Troponin I: <0.3 ng/mL (ref <0.30)

## 2013-04-03 LAB — TSH: TSH: 2.53 u[IU]/mL (ref 0.350–4.500)

## 2013-04-03 LAB — HEMOGLOBIN A1C: Hgb A1c MFr Bld: 8.3 % — ABNORMAL HIGH (ref <5.7)

## 2013-04-03 MED ORDER — IPRATROPIUM BROMIDE 0.02 % IN SOLN
RESPIRATORY_TRACT | Status: AC
  Filled 2013-04-03: qty 2.5

## 2013-04-03 MED ORDER — INSULIN ASPART 100 UNIT/ML ~~LOC~~ SOLN
8.0000 [IU] | Freq: Three times a day (TID) | SUBCUTANEOUS | Status: DC
  Administered 2013-04-04 – 2013-04-10 (×20): 8 [IU] via SUBCUTANEOUS

## 2013-04-03 MED ORDER — ALBUTEROL SULFATE (5 MG/ML) 0.5% IN NEBU
INHALATION_SOLUTION | RESPIRATORY_TRACT | Status: AC
  Filled 2013-04-03: qty 0.5

## 2013-04-03 MED ORDER — ISOSORBIDE MONONITRATE ER 30 MG PO TB24
30.0000 mg | ORAL_TABLET | Freq: Every day | ORAL | Status: DC
  Administered 2013-04-03 – 2013-04-08 (×6): 30 mg via ORAL
  Filled 2013-04-03 (×7): qty 1

## 2013-04-03 MED ORDER — CYCLOBENZAPRINE HCL 10 MG PO TABS
5.0000 mg | ORAL_TABLET | Freq: Three times a day (TID) | ORAL | Status: DC | PRN
  Administered 2013-04-03 (×2): 5 mg via ORAL
  Filled 2013-04-03 (×2): qty 1

## 2013-04-03 MED ORDER — IPRATROPIUM BROMIDE 0.02 % IN SOLN: 0.5000 mg | RESPIRATORY_TRACT | Status: DC

## 2013-04-03 MED ORDER — CYCLOBENZAPRINE HCL 5 MG PO TABS
5.0000 mg | ORAL_TABLET | Freq: Once | ORAL | Status: AC
  Administered 2013-04-03: 18:00:00 5 mg via ORAL
  Filled 2013-04-03: qty 1

## 2013-04-03 MED ORDER — PREDNISONE 20 MG PO TABS
30.0000 mg | ORAL_TABLET | Freq: Every day | ORAL | Status: DC
  Administered 2013-04-04: 30 mg via ORAL
  Filled 2013-04-03 (×2): qty 1

## 2013-04-03 MED ORDER — IPRATROPIUM BROMIDE 0.02 % IN SOLN
0.5000 mg | Freq: Four times a day (QID) | RESPIRATORY_TRACT | Status: DC | PRN
  Administered 2013-04-06: 04:00:00 0.5 mg via RESPIRATORY_TRACT
  Filled 2013-04-03: qty 2.5

## 2013-04-03 MED ORDER — ALBUTEROL SULFATE (5 MG/ML) 0.5% IN NEBU: 2.5000 mg | INHALATION_SOLUTION | Freq: Four times a day (QID) | RESPIRATORY_TRACT | Status: DC | PRN

## 2013-04-03 MED ORDER — ALBUTEROL SULFATE (5 MG/ML) 0.5% IN NEBU
2.5000 mg | INHALATION_SOLUTION | Freq: Four times a day (QID) | RESPIRATORY_TRACT | Status: DC | PRN
  Administered 2013-04-06: 2.5 mg via RESPIRATORY_TRACT
  Filled 2013-04-03: qty 0.5

## 2013-04-03 NOTE — Progress Notes (Signed)
Patient refused to wear CPAP tonight. Was told if she changed her mind to call RT.  RT will continue to monitor.

## 2013-04-03 NOTE — Progress Notes (Signed)
CRITICAL VALUE ALERT  Critical value received:  CBG 50   Date of notification:  04/03/13  Time of notification:  1655  Critical value read back:yes  Nurse who received alert:  Junie Panning   MD notified (1st page):  Thedore Mins  Time of first page:  1654  MD notified (2nd page):  Time of second page:  Responding MD: Thedore Mins  Time MD responded:  1655  Recheck was 69 and 15 minutes later was 73. New orders given ok to hold meal coverage for dinner.

## 2013-04-03 NOTE — Progress Notes (Signed)
*  PRELIMINARY RESULTS* Echocardiogram 2D Echocardiogram has been performed.  [name] 04/03/2013, 4:18 PM

## 2013-04-03 NOTE — Progress Notes (Signed)
Patient complaining of cramping in fingers and legs. Small packages of mustard and vinegar was given. MD notified. New orders given. Will continue to monitor patient for further changes in condition.

## 2013-04-03 NOTE — ED Provider Notes (Signed)
I saw and evaluated the patient, reviewed the resident's note and I agree with the findings and plan. If applicable, I agree with the resident's interpretation of the EKG.  If applicable, I was present for critical portions of any procedures performed.  Progressively worsening SOB and DOE without chest pain or weight gain. No distress. Scattered wheezing, with minimal pretibial edema Intact pulses. Multifactorial dyspnea 2/2 CHF, COPD, OSA, obesity hypoventilation  Glynn Octave, MD 04/03/13 [phone]

## 2013-04-03 NOTE — Progress Notes (Signed)
Spoke with pt. About wearing CPAP & pt. Stated that she didn't wear CPAP at home. Pt. Stated that she wears a FFM at home but it feels like the mask smothers her. Pt. Was offered a nasal mask but pt. Stated that she had to wear a FFM because she was a mouth breather. Pt. Was made aware to let RT know if she wanted to wear CPAP anytime during the night.

## 2013-04-03 NOTE — Progress Notes (Signed)
Report given to receiving RN. Patient is stable. No signs or symptoms of distress or discomfort.  

## 2013-04-03 NOTE — Progress Notes (Signed)
Utilization Review Completed Marysa Wessner J. Rasheen Schewe, RN, BSN, NCM [phone]  

## 2013-04-03 NOTE — Progress Notes (Signed)
Patient evaluated for long-term disease management services with [facility] as a benefit of her [employer]. Agreeable to a post hospital discharge call and a referral to [facility] case management for disease management. Will make inpatient RNCM aware of visit.   Raiford Noble, MSN, RN,BSN, [facility], [phone]

## 2013-04-03 NOTE — Progress Notes (Signed)
Patient continues to complain of cramping in fingers and legs. MD notified. New order to give additional dose of flexeril. Will continue to monitor patient for further changes in condition.

## 2013-04-03 NOTE — Progress Notes (Signed)
[facility]                                                                                Patient Demographics  [name], is a 67 y.o. female, DOB - 1946-03-23, ZOX:[id number]  Admit date - 04/02/2013   Admitting Physician Houston Siren, MD  Outpatient Primary MD for the patient is Michele Mcalpine, MD Outpatient cardiology [place]  LOS - 1   Chief Complaint  Patient presents with  . Shortness of Breath  . Weakness        Assessment & Plan     1. Acute on chronic respiratory failure in a patient with COPD who is on liter nasal care oxygen at home - was admitted with COPD exacerbation/CHF exacerbation. In my exam her findings are consistent with fluid overload secondary to acute on chronic nonspecific CHF. Note last echogram from March of this year shows an EF of 50% with no mention of LVH, she does have mild pulmonary hypertension.  At this time as per admission plan gentle IV Lasix will be continued, I have placed her on fluid and salt restriction, will add Imdur, continue low-dose beta blocker, echo has been reordered upon admission to evaluate wall motion, we'll taper down to oral steroid low dose along with azithromycin as I do not hear any wheezing and signs of acute COPD exacerbation. Oxygen and nebulizer treatment supplementation as needed. Repeat 2 view chest x-ray in the morning.  Continue Outpatient pulmonary followup post discharge as she has already hypertension and underlying history of sarcoidosis.     2.DM-2 - continue present regimen with sliding scale insulin along with Levemir and premeal NovoLog will check A1c   Lab Results  Component Value Date   HGBA1C 9.3* 08/11/2011    CBG (last 3)   Recent Labs  04/02/13 1757  GLUCAP 139*      3.OSA with  mild pulmonary hypertension. She is noncompliant with CPAP counseled to wear CPAP at night.     4. Sarcoidosis. Continue home dose steroid.     5. CKD stage IV. Baseline creatinine appears to  be around 2.2, monitor renal function closely with diuresis.     Code Status: full  Family Communication: none present  Disposition Plan: hiome   Procedures TTE   Consults      DVT Prophylaxis   Heparin   Lab Results  Component Value Date   PLT 360 04/03/2013    Medications  Scheduled Meds: . albuterol      . allopurinol  100 mg Oral Daily  . amLODipine  10 mg Oral Daily  . aspirin EC  81 mg Oral Daily  . atorvastatin  20 mg Oral Daily  . azithromycin  500 mg Intravenous Q24H  . cloNIDine  0.1 mg Oral BID  . dicyclomine  10 mg Oral TID AC & HS  . fluticasone  2 spray Each Nare QHS  . furosemide  40 mg Intravenous Daily  . heparin  5,000 Units Subcutaneous Q8H  . hydrALAZINE  10 mg Oral TID  . insulin aspart  0-20 Units Subcutaneous TID WC  . insulin aspart  0-5 Units Subcutaneous QHS  . insulin aspart  18 Units Subcutaneous QAC breakfast  . insulin aspart  26 Units Subcutaneous QAC supper  . insulin aspart  30 Units Subcutaneous QAC lunch  . insulin detemir  70 Units Subcutaneous q morning - 10a  . ipratropium      . ketorolac  1 drop Both Eyes TID  . metoprolol tartrate  12.5 mg Oral Daily  . mometasone-formoterol  2 puff Inhalation BID  . pantoprazole  80 mg Oral Q1200  . [START ON 04/04/2013] predniSONE  30 mg Oral Q breakfast  . pregabalin  100 mg Oral QPM  . sodium chloride  3 mL Intravenous Q12H  . sodium chloride  3 mL Intravenous Q12H   Continuous Infusions:  PRN Meds:.sodium chloride, albuterol, albuterol, HYDROcodone-acetaminophen, ipratropium, ondansetron, sodium chloride  Antibiotics    Anti-infectives   Start     Dose/Rate Route Frequency Ordered Stop   04/03/13 0000  azithromycin (ZITHROMAX) 500 mg in dextrose 5 % 250 mL IVPB     500 mg 250 mL/hr over 60 Minutes Intravenous Every 24 hours 04/02/13 2218         Time Spent in minutes   35   Jeannene Tschetter K M.D on 04/03/2013 at 8:47 AM  Between 7am to 7pm - Pager - [phone]  After  7pm go to [email or web address] - password [email or web address]  And look for the night coverage person covering for me after hours  [facility] Office  [phone]    Subjective:   [name] today has, No headache, No chest pain, No abdominal pain - No Nausea, No new weakness tingling or numbness, No Cough - improved SOB.    Objective:   Filed Vitals:   04/02/13 2152 04/03/13 0012 04/03/13 0209 04/03/13 0457  BP: 134/94  129/70 141/72  Pulse: 74  55 67  Temp: 97.9 F (36.6 C)   98.1 F (36.7 C)  TempSrc: Oral   Oral  Resp: 18  18 18   Height: 5\' 2"  (1.575 m)     Weight: 102.6 kg (226 lb 3.1 oz)   102.468 kg (225 lb 14.4 oz)  SpO2: 100% 100% 97% 98%    Wt Readings from Last 3 Encounters:  04/03/13 102.468 kg (225 lb 14.4 oz)  03/15/13 99.791 kg (220 lb)  02/19/13 97.251 kg (214 lb 6.4 oz)     Intake/Output Summary (Last 24 hours) at 04/03/13 0847 Last data filed at 04/03/13 0600  Gross per 24 hour  Intake    610 ml  Output   1000 ml  Net   -390 ml    Exam Awake Alert, Oriented X 3, No new F.N deficits, Normal affect [place].AT,PERRAL Supple Neck, + JVD, No cervical lymphadenopathy appriciated.  Symmetrical Chest wall movement, Good air movement bilaterally, bilat rales RRR,No Gallops,Rubs or new Murmurs, No Parasternal Heave +ve B.Sounds, Abd Soft, Non tender, No organomegaly appriciated, No rebound - guarding or rigidity. No Cyanosis, Clubbing , trace edema, No new Rash or bruise      Data Review   Micro Results No results found for this or any previous visit (from the past 240 hour(s)).  Radiology Reports Dg Chest 2 View  04/02/2013   *RADIOLOGY REPORT*  Clinical Data: Shortness of breath  CHEST - 2 VIEW  Comparison: 10/17/2012  Findings: Cardiomegaly and pulmonary vascular congestion noted. Mild elevation of the right hemidiaphragm is again noted. There is no evidence of focal airspace disease, pulmonary edema, suspicious pulmonary nodule/mass, pleural effusion, or  pneumothorax. No acute bony abnormalities are identified.  IMPRESSION:  Cardiomegaly with pulmonary vascular congestion.   Original Report Authenticated By: Harmon Pier, M.D.    CBC  Recent Labs Lab 04/02/13 1636 04/02/13 2300 04/03/13 0420  WBC 13.5* 12.7* 12.4*  HGB 11.2* 11.0* 10.8*  HCT 35.0* 33.2* 33.2*  PLT 380 386 360  MCV 92.6 91.2 91.0  MCH 29.6 30.2 29.6  MCHC 32.0 33.1 32.5  RDW 14.5 14.2 14.1    Chemistries   Recent Labs Lab 04/02/13 1636 04/02/13 2300 04/03/13 0420  NA 138  --  137  K 4.2  --  3.9  CL 102  --  101  CO2 24  --  27  GLUCOSE 109*  --  164*  BUN 43*  --  47*  CREATININE 2.27* 2.32* 2.27*  CALCIUM 9.0  --  9.0   ------------------------------------------------------------------------------------------------------------------ estimated creatinine clearance is 27.7 ml/min (by C-G formula based on Cr of 2.27). ------------------------------------------------------------------------------------------------------------------ No results found for this basename: HGBA1C,  in the last 72 hours ------------------------------------------------------------------------------------------------------------------ No results found for this basename: CHOL, HDL, LDLCALC, TRIG, CHOLHDL, LDLDIRECT,  in the last 72 hours ------------------------------------------------------------------------------------------------------------------ No results found for this basename: TSH, T4TOTAL, FREET3, T3FREE, THYROIDAB,  in the last 72 hours ------------------------------------------------------------------------------------------------------------------ No results found for this basename: VITAMINB12, FOLATE, FERRITIN, TIBC, IRON, RETICCTPCT,  in the last 72 hours  Coagulation profile No results found for this basename: INR, PROTIME,  in the last 168 hours  No results found for this basename: DDIMER,  in the last 72 hours  Cardiac Enzymes  Recent Labs Lab 04/02/13 2218  04/03/13 0418  TROPONINI <0.30 <0.30   ------------------------------------------------------------------------------------------------------------------ No components found with this basename: POCBNP,

## 2013-04-04 ENCOUNTER — Inpatient Hospital Stay (HOSPITAL_COMMUNITY): Payer: Medicare Other

## 2013-04-04 DIAGNOSIS — E119 Type 2 diabetes mellitus without complications: Secondary | ICD-10-CM

## 2013-04-04 DIAGNOSIS — I5031 Acute diastolic (congestive) heart failure: Secondary | ICD-10-CM

## 2013-04-04 LAB — MAGNESIUM: Magnesium: 2 mg/dL (ref 1.5–2.5)

## 2013-04-04 LAB — GLUCOSE, CAPILLARY
Glucose-Capillary: 309 mg/dL — ABNORMAL HIGH (ref 70–99)
Glucose-Capillary: 351 mg/dL — ABNORMAL HIGH (ref 70–99)
Glucose-Capillary: 138 mg/dL — ABNORMAL HIGH (ref 70–99)

## 2013-04-04 LAB — CBC
HCT: 33.2 % — ABNORMAL LOW (ref 36.0–46.0)
MCH: 29.3 pg (ref 26.0–34.0)
MCV: 91.7 fL (ref 78.0–100.0)
Platelets: 392 10*3/uL (ref 150–400)
RBC: 3.62 MIL/uL — ABNORMAL LOW (ref 3.87–5.11)

## 2013-04-04 LAB — BASIC METABOLIC PANEL
BUN: 51 mg/dL — ABNORMAL HIGH (ref 6–23)
CO2: 26 mEq/L (ref 19–32)
Calcium: 9 mg/dL (ref 8.4–10.5)
Creatinine, Ser: 2.55 mg/dL — ABNORMAL HIGH (ref 0.50–1.10)

## 2013-04-04 MED ORDER — FUROSEMIDE 10 MG/ML IJ SOLN
40.0000 mg | Freq: Two times a day (BID) | INTRAMUSCULAR | Status: DC
  Administered 2013-04-04 – 2013-04-05 (×2): 40 mg via INTRAVENOUS
  Filled 2013-04-04 (×4): qty 4

## 2013-04-04 MED ORDER — AZITHROMYCIN 500 MG PO TABS
500.0000 mg | ORAL_TABLET | Freq: Every day | ORAL | Status: DC
  Administered 2013-04-04: 500 mg via ORAL
  Filled 2013-04-04 (×2): qty 1

## 2013-04-04 NOTE — Care Management Note (Addendum)
    Page 1 of 2   04/10/2013     3:00:34 PM   CARE MANAGEMENT NOTE 04/10/2013  Patient:  [name],[name]   Account Number:  [id number]  Date Initiated:  04/04/2013  Documentation initiated by:  [facility]  Subjective/Objective Assessment:   67 y.o. female with hx of sleep apnea (noncompliant with CPAP), morbid obesity, asthma and COPD, chronic steroid use for sarcoidosis, chronic diastolic CHF, CAD, DM, GERD, hyperlipidemia, HTN//home alone     Action/Plan:   diurese//home with Home Health   Anticipated DC Date:  04/06/2013   Anticipated DC Plan:  HOME W HOME HEALTH SERVICES      DC Planning Services  CM consult      [facility] Choice  HOME HEALTH   Choice offered to / List presented to:  C-1 Patient        HH arranged  HH-1 RN  HH-10 DISEASE MANAGEMENT      HH agency  [facility]   Status of service:  Completed, signed off Medicare Important Message given?   (If response is "NO", the following Medicare IM given date fields will be blank) Date Medicare IM given:   Date Additional Medicare IM given:    Discharge Disposition:    Per UR Regulation:    If discussed at Long Length of Stay Meetings, dates discussed:    Comments:  04/10/13 Oletta Cohn, RN, BSN, NCM [phone] Copay $70/30 day supply - $140/60 day supply - $210/90 day supply - must use [facility] near home, [facility] near home.  Otherwise price will be higher.  No prior auth required -   04/04/13 1310 Camellia Wood, RN, BSN, [employer] Spoke with pt at bedside regarding discharge planning for [employer].  Offered pt list of Home Health agencies to choose from.  Pt chose [facility] to render services.  Corrie Dandy of [place] notified.  No DME needs identified at this time.

## 2013-04-04 NOTE — Plan of Care (Signed)
Problem: Phase I Progression Outcomes Goal: Dyspnea controlled at rest (HF) Outcome: Completed/Met Date Met:  04/04/13 Patient continues to have dyspnea on exertion but is able to breathe comfortably while at rest.  Continues on O2 3L.  No complications this shift.

## 2013-04-04 NOTE — Plan of Care (Signed)
Problem: Phase I Progression Outcomes Goal: EF % per last Echo/documented,Core Reminder form on chart Outcome: Completed/Met Date Met:  04/04/13 60-65%

## 2013-04-04 NOTE — Progress Notes (Signed)
Pt. Refused cpap for tonight. 

## 2013-04-04 NOTE — Progress Notes (Signed)
Report given to receiving RN. Patient is stable. No signs or symptoms of distress or discomfort.  

## 2013-04-04 NOTE — Progress Notes (Addendum)
[facility] PROGRESS NOTE  Assessment/Plan:  Acute diastolic congestive heart failure: - Weight on admission: 102.4 kg increase to 104.1 kg ( back on 3.20.2014 was 89.3 kg) - She has positive JVD with lower extremity edema, ECHO showed diastolic heart failure. - She relates at home she is not compliant with fluid or salt restriction. - I will go ahead and increase her lasix, restrict her fluid even further. - follow daily weights strict I and O's. - d/c steroids and azithromycin.  RENAL INSUFFICIENCY - baseline Cr 1.4. - ? Due to cardiorenal syndrome. - Check a Una and U Cr.  OBESITY - counseling.   OBSTRUCTIVE SLEEP APNEA - cont C-pap at home dose.   DM - Not complaint with diet, most likely the reason her BG went up. - d/cing prednisone.   HYPERTENSION - d/c clonidine and Norvasc. - increase lasix.    Code Status: full Family Communication: none  Disposition Plan: inpatinet   Consultants:  none  Procedures:  echo  Antibiotics:  Azithromycin  HPI/Subjective: She relates she feel about the same  Objective: Filed Vitals:   04/03/13 2115 04/04/13 0537 04/04/13 0855 04/04/13 1109  BP:  124/55  136/55  Pulse:  60  71  Temp:  98.1 F (36.7 C)    TempSrc:  Oral    Resp:  18    Height:      Weight:  104.146 kg (229 lb 9.6 oz)    SpO2: 92% 92% 98%     Intake/Output Summary (Last 24 hours) at 04/04/13 1311 Last data filed at 04/04/13 1300  Gross per 24 hour  Intake   1576 ml  Output   2675 ml  Net  -1099 ml   Filed Weights   04/02/13 2152 04/03/13 0457 04/04/13 0537  Weight: 102.6 kg (226 lb 3.1 oz) 102.468 kg (225 lb 14.4 oz) 104.146 kg (229 lb 9.6 oz)    Exam:  General: Alert, awake, oriented x3, in no acute distress.  HEENT: No bruits, no goiter. +JVD Heart: Regular rate and rhythm, without murmurs, rubs, gallops.  Anasarca. Lungs: Good air movement, crackles a bases Abdomen: Soft, nontender, nondistended, positive bowel sounds. +3  edema on her abdomen and sacrum.    Data Reviewed: Basic Metabolic Panel:  Recent Labs Lab 04/02/13 1636 04/02/13 2300 04/03/13 0420 04/04/13 0443  NA 138  --  137 139  K 4.2  --  3.9 4.2  CL 102  --  101 104  CO2 24  --  27 26  GLUCOSE 109*  --  164* 94  BUN 43*  --  47* 51*  CREATININE 2.27* 2.32* 2.27* 2.55*  CALCIUM 9.0  --  9.0 9.0  MG  --   --   --  2.0   Liver Function Tests: No results found for this basename: AST, ALT, ALKPHOS, BILITOT, PROT, ALBUMIN,  in the last 168 hours No results found for this basename: LIPASE, AMYLASE,  in the last 168 hours No results found for this basename: AMMONIA,  in the last 168 hours CBC:  Recent Labs Lab 04/02/13 1636 04/02/13 2300 04/03/13 0420 04/04/13 0443  WBC 13.5* 12.7* 12.4* 9.8  HGB 11.2* 11.0* 10.8* 10.6*  HCT 35.0* 33.2* 33.2* 33.2*  MCV 92.6 91.2 91.0 91.7  PLT 380 386 360 392   Cardiac Enzymes:  Recent Labs Lab 04/02/13 2218 04/03/13 0418 04/03/13 1147  TROPONINI <0.30 <0.30 <0.30   BNP (last 3 results)  Recent Labs  04/26/12 1002 04/02/13 1637  PROBNP 196.0* 2612.0*   CBG:  Recent Labs Lab 04/03/13 1553 04/03/13 1622 04/03/13 2123 04/04/13 0547 04/04/13 1058  GLUCAP 69* 73 184* 71 309*    No results found for this or any previous visit (from the past 240 hour(s)).   Studies: Dg Chest 2 View  04/04/2013   CLINICAL DATA:  One CHF, all.  EXAM: CHEST  2 VIEW  COMPARISON:  04/02/2013  FINDINGS: Mild cardiomegaly with vascular congestion. No focal opacities, effusions or overt edema. No acute bony abnormality.  IMPRESSION: Cardiomegaly, vascular congestion.   Electronically Signed   By: Charlett Nose   On: 04/04/2013 08:38   Dg Chest 2 View  04/02/2013   *RADIOLOGY REPORT*  Clinical Data: Shortness of breath  CHEST - 2 VIEW  Comparison: 10/17/2012  Findings: Cardiomegaly and pulmonary vascular congestion noted. Mild elevation of the right hemidiaphragm is again noted. There is no evidence of  focal airspace disease, pulmonary edema, suspicious pulmonary nodule/mass, pleural effusion, or pneumothorax. No acute bony abnormalities are identified.  IMPRESSION: Cardiomegaly with pulmonary vascular congestion.   Original Report Authenticated By: Harmon Pier, M.D.    Scheduled Meds: . allopurinol  100 mg Oral Daily  . amLODipine  10 mg Oral Daily  . aspirin EC  81 mg Oral Daily  . atorvastatin  20 mg Oral Daily  . azithromycin  500 mg Oral Daily  . cloNIDine  0.1 mg Oral BID  . dicyclomine  10 mg Oral TID AC & HS  . fluticasone  2 spray Each Nare QHS  . furosemide  40 mg Intravenous BID  . heparin  5,000 Units Subcutaneous Q8H  . hydrALAZINE  10 mg Oral TID  . insulin aspart  0-20 Units Subcutaneous TID WC  . insulin aspart  8 Units Subcutaneous TID WC  . insulin detemir  70 Units Subcutaneous q morning - 10a  . isosorbide mononitrate  30 mg Oral Daily  . ketorolac  1 drop Both Eyes TID  . metoprolol tartrate  12.5 mg Oral Daily  . mometasone-formoterol  2 puff Inhalation BID  . pantoprazole  80 mg Oral Q1200  . predniSONE  30 mg Oral Q breakfast  . pregabalin  100 mg Oral QPM  . sodium chloride  3 mL Intravenous Q12H  . sodium chloride  3 mL Intravenous Q12H   Continuous Infusions:    Marinda Elk  [facility] Pager [phone]. If 8PM-8AM, please contact night-coverage at [email or web address], password [facility] 04/04/2013, 1:11 PM  LOS: 2 days

## 2013-04-05 DIAGNOSIS — I5033 Acute on chronic diastolic (congestive) heart failure: Secondary | ICD-10-CM

## 2013-04-05 LAB — CREATININE, URINE, RANDOM: Creatinine, Urine: 110.63 mg/dL

## 2013-04-05 LAB — BASIC METABOLIC PANEL
BUN: 62 mg/dL — ABNORMAL HIGH (ref 6–23)
Chloride: 103 mEq/L (ref 96–112)
GFR calc Af Amer: 20 mL/min — ABNORMAL LOW (ref >90)
GFR calc non Af Amer: 17 mL/min — ABNORMAL LOW (ref >90)
Potassium: 4.3 mEq/L (ref 3.5–5.1)
Sodium: 138 mEq/L (ref 135–145)

## 2013-04-05 LAB — GLUCOSE, CAPILLARY
Glucose-Capillary: 103 mg/dL — ABNORMAL HIGH (ref 70–99)
Glucose-Capillary: 137 mg/dL — ABNORMAL HIGH (ref 70–99)
Glucose-Capillary: 108 mg/dL — ABNORMAL HIGH (ref 70–99)

## 2013-04-05 LAB — SODIUM, URINE, RANDOM: Sodium, Ur: 52 mEq/L

## 2013-04-05 MED ORDER — FUROSEMIDE 10 MG/ML IJ SOLN
15.0000 mg/h | INTRAVENOUS | Status: DC
  Administered 2013-04-05 – 2013-04-06 (×2): 20 mg/h via INTRAVENOUS
  Administered 2013-04-06 – 2013-04-07 (×2): 15 mg/h via INTRAVENOUS
  Filled 2013-04-05 (×7): qty 25

## 2013-04-05 MED ORDER — FUROSEMIDE 10 MG/ML IJ SOLN: 40.0000 mg | Freq: Two times a day (BID) | INTRAMUSCULAR | Status: DC

## 2013-04-05 MED ORDER — METOLAZONE 2.5 MG PO TABS
2.5000 mg | ORAL_TABLET | Freq: Every day | ORAL | Status: DC
  Administered 2013-04-05: 16:00:00 2.5 mg via ORAL
  Filled 2013-04-05 (×3): qty 1

## 2013-04-05 MED ORDER — FUROSEMIDE 10 MG/ML IJ SOLN: 60.0000 mg | Freq: Two times a day (BID) | INTRAMUSCULAR | Status: DC

## 2013-04-05 NOTE — Progress Notes (Signed)
Pt. Refused cpap. Pt. States she will just wear her oxygen. 

## 2013-04-05 NOTE — Consult Note (Signed)
Advanced Heart Failure Team Consult Note  Referring Physician: Birdena Jubilee, MD Primary Physician: Primary Cardiologist:  Valera Castle, MD  Reason for Consultation: Heart failure management  HPI:    [name] is a 67 y.o. female with hx of morbid obesity, sleep apnea (noncompliant with CPAP), asthma/COPD, chronic prediosne use for sarcoidosis, CRI (baseline Cr 2.2- chronic diastolic CHF, CAD and DM She has been followed by Dr Kriste Basque for pulmonary and Dr Daleen Squibb for cardiology.  Admitted earlier this week for increasing DOE, wheezing, orthopnea, and bilateral pedal edema. Evalaution in the ER included a BNP of 2600, CXR with vascular congestion, and WBC of 13.5K. Weight was 230 (previous baseline weight 205-215). Prior to admit taking lasix 20 daily.   She was admitted for diuresis. Weight initially went down to 225 but now back at 230. On lasix 40 IV bid. Cr now climbing 2.2->2.7  Echo 04/03/13: EF 60-65% grade 2 DD. Mild AS. RV normal.   Review of Systems: [y] = yes, [ ]  = no   General: Weight gain [ y]; Weight loss [ ] ; Anorexia [ ] ; Fatigue y ]; Fever [ ] ; Chills [ ] ; Weakness [ ]   Cardiac: Chest pain/pressure [ ] ; Resting SOB Cove.Etienne ]; Exertional SOB Cove.Etienne ]; Orthopnea Cove.Etienne ]; Pedal Edema Cove.Etienne ]; Palpitations [ ] ; Syncope [ ] ; Presyncope [ ] ; Paroxysmal nocturnal dyspnea[ ]   Pulmonary: Cough [ ] ; Wheezing[ y]; Hemoptysis[ ] ; Sputum [ y]; Snoring Cove.Etienne ]  GI: Vomiting[ ] ; Dysphagia[ ] ; Melena[ ] ; Hematochezia [ ] ; Heartburn[ ] ; Abdominal pain [ ] ; Constipation [ ] ; Diarrhea [ ] ; BRBPR [ ]   GU: Hematuria[ ] ; Dysuria [ ] ; Nocturia[ ]   Vascular: Pain in legs with walking [ ] ; Pain in feet with lying flat [ ] ; Non-healing sores [ ] ; Stroke [ ] ; TIA [ ] ; Slurred speech [ ] ;  Neuro: Headaches[ ] ; Vertigo[ ] ; Seizures[ ] ; Paresthesias[ ] ;Blurred vision [ ] ; Diplopia [ ] ; Vision changes [ ]   Ortho/Skin: Arthritis Cove.Etienne ]; Joint pain [ y]; Muscle pain [ ] ; Joint swelling [ ] ; Back Pain [ ] ; Rash Cove.Etienne ]  Psych:  Depression[ ] ; Anxiety[ ]   Heme: Bleeding problems [ ] ; Clotting disorders [ ] ; Anemia [ ]   Endocrine: Diabetes Cove.Etienne ]; Thyroid dysfunction[ ]   Home Medications Prior to Admission medications   Medication Sig Start Date End Date Taking? Authorizing Provider  albuterol (PROVENTIL HFA;VENTOLIN HFA) 108 (90 BASE) MCG/ACT inhaler Inhale 2 puffs into the lungs every 6 (six) hours as needed for wheezing or shortness of breath.   Yes Historical Provider, MD  albuterol (PROVENTIL) (2.5 MG/3ML) 0.083% nebulizer solution Take 2.5 mg by nebulization every 4 (four) hours as needed for wheezing.   Yes Historical Provider, MD  allopurinol (ZYLOPRIM) 100 MG tablet Take 100 mg by mouth daily.   Yes Historical Provider, MD  amLODipine (NORVASC) 10 MG tablet Take 10 mg by mouth daily.   Yes Historical Provider, MD  aspirin EC 81 MG tablet Take 81 mg by mouth daily.   Yes Historical Provider, MD  atorvastatin (LIPITOR) 20 MG tablet Take 20 mg by mouth daily.   Yes Historical Provider, MD  cloNIDine (CATAPRES) 0.1 MG tablet Take 0.1 mg by mouth 2 (two) times daily.   Yes Historical Provider, MD  Dextromethorphan-Guaifenesin (TUSSIN DM) 10-100 MG/5ML liquid Take 5 mLs by mouth daily as needed (cough).    Yes Historical Provider, MD  dicyclomine (BENTYL) 10 MG capsule Take 10 mg by mouth 4 (four) times daily -  before meals and at bedtime.   Yes Historical Provider, MD  diphenoxylate-atropine (LOMOTIL) 2.5-0.025 MG per tablet Take 1 tablet by mouth 4 (four) times daily as needed for diarrhea or loose stools. 03/09/13  Yes Lonia Skinner Sofia, PA-C  esomeprazole (NEXIUM) 40 MG capsule Take 1 capsule (40 mg total) by mouth 2 (two) times daily. 02/14/13  Yes Michele Mcalpine, MD  ferrous sulfate 325 (65 FE) MG tablet Take 325 mg by mouth daily with breakfast.   Yes Historical Provider, MD  fluticasone (FLONASE) 50 MCG/ACT nasal spray Place 2 sprays into the nose at bedtime. 02/14/13  Yes Michele Mcalpine, MD  Fluticasone-Salmeterol  (ADVAIR) 250-50 MCG/DOSE AEPB Inhale 1 puff into the lungs 2 (two) times daily. 02/14/13  Yes Michele Mcalpine, MD  furosemide (LASIX) 40 MG tablet Take 20 mg by mouth daily.   Yes Historical Provider, MD  glucose blood test strip 1 each by Other route as needed for other. Use as instructed   Yes Historical Provider, MD  guaiFENesin (MUCINEX) 600 MG 12 hr tablet Take 1,200 mg by mouth daily.   Yes Historical Provider, MD  hydrALAZINE (APRESOLINE) 10 MG tablet Take 10 mg by mouth 3 (three) times daily.   Yes Historical Provider, MD  HYDROcodone-acetaminophen (NORCO/VICODIN) 5-325 MG per tablet Take 2 tablets by mouth every 4 (four) hours as needed for pain. 03/09/13  Yes Lonia Skinner Sofia, PA-C  insulin detemir (LEVEMIR) 100 UNIT/ML injection Inject 70 Units into the skin every morning.    Yes Historical Provider, MD  insulin glulisine (APIDRA SOLOSTAR) 100 UNIT/ML injection Inject 18-30 Units into the skin 3 (three) times daily before meals. Use 18  units before breakfast and 30 units before lunch and 26  units before dinner   Yes Historical Provider, MD  ketorolac (ACULAR LS) 0.4 % SOLN Place 1 drop into both eyes 3 (three) times daily.    Yes Historical Provider, MD  Lancets MISC 1 Units by Does not apply route 4 (four) times daily - after meals and at bedtime.   Yes Historical Provider, MD  LORazepam (ATIVAN) 1 MG tablet Take 1 mg by mouth daily as needed.    Yes Historical Provider, MD  metoprolol tartrate (LOPRESSOR) 25 MG tablet Take 0.5 tablets (12.5 mg total) by mouth daily. 02/14/13  Yes Michele Mcalpine, MD  ondansetron (ZOFRAN ODT) 4 MG disintegrating tablet Take 1 tablet (4 mg total) by mouth every 8 (eight) hours as needed for nausea. 03/09/13  Yes Lonia Skinner Sofia, PA-C  pregabalin (LYRICA) 100 MG capsule Take 1 capsule (100 mg total) by mouth every evening. 02/14/13  Yes Michele Mcalpine, MD  sennosides-docusate sodium (SENOKOT-S) 8.6-50 MG tablet Take 1 tablet by mouth at bedtime.   Yes Historical Provider,  MD    Past Medical History: Past Medical History  Diagnosis Date  . Unspecified essential hypertension   . CAD (coronary artery disease) 01/04/2006    Tiny OM1 70-90% ostial stenosis, no other CAD  . Asthma   . Esophageal reflux   . Obesity, unspecified   . Anxiety state, unspecified   . Hypercalcemia   . Anemia   . Other psoriasis   . Diverticulosis   . Sarcoidosis 2012  . Dyslipidemia   . COPD (chronic obstructive pulmonary disease)   . Gout   . Colon polyps     Tubular adenomatous polyps  . Spinal stenosis of lumbar region   . Bulging lumbar disc   . CHF (congestive heart  failure)   . PNA (pneumonia) 2012    With pleural effusion, requiring thoracentesis  . Pneumonia     "several times" (04/02/2013)  . Pleural effusion 2012  . Chronic bronchitis   . Exertional shortness of breath     "and lying flat" (04/02/2013)  . On home oxygen therapy     "2L when I'm up; 3L when I'm at sleep" (04/02/2013)  . OSA (obstructive sleep apnea)     marginally compliant with CPAP  . Type II diabetes mellitus   . Degenerative joint disease   . Arthritis     'all over" (04/02/2013)  . Chronic lower back pain   . Kidney disease     baseline creatinie 2.0,  folowed by Dr Caryn Section  . Renal failure     "kidney's are working @ 50%" (04/02/2013)  . Diverticulosis   . GERD (gastroesophageal reflux disease)   . Anxiety     Past Surgical History: Past Surgical History  Procedure Laterality Date  . Vesicovaginal fistula closure w/  total abdominal hysterectomy  1992  . Bilateral total knee replacements Bilateral Rt=5/04 & Lft=1/09    by DrAlusio  . 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 Abbey Chatters  . Appendectomy  1957  . Tonsillectomy  1968  . Cholecystectomy  1980's  . Abdominal hysterectomy  1992  . Reduction mammaplasty Bilateral 1980's  . Cardiac catheterization  1990's  . Thoracentesis  2012    Family History: Family History  Problem Relation Age of Onset  . Diabetes Mother   . Breast  cancer Maternal Aunt     Social History: History   Social History  . Marital Status: Single    Spouse Name: N/A    Number of Children: 0  . Years of Education: N/A   Occupational History  . Retired   . SECRETARY    Social History Main Topics  . Smoking status: Never Smoker   . Smokeless tobacco: Never Used  . Alcohol Use: No  . Drug Use: No  . Sexual Activity: Not Currently   Other Topics Concern  . None   Social History Narrative   Lives in [place] alone.  Never married.   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[employer]    Allergies:  Allergies  Allergen Reactions  . Adhesive [Tape] Other (See Comments)    Blisters   . Avelox [Moxifloxacin Hcl In Nacl]     GI upset  . Codeine Other (See Comments)    "crazy"   . Guaifenesin Nausea And Vomiting  . Latex Swelling  . Oxycodone Nausea And Vomiting    Objective:    Vital Signs:   Temp:  [97.8 F (36.6 C)] 97.8 F (36.6 C) (09/04 1455) Pulse Rate:  [55-64] 59 (09/04 1455) Resp:  [18-19] 18 (09/04 1455) BP: (115-138)/(40-74) 132/59 mmHg (09/04 1455) SpO2:  [94 %-100 %] 97 % (09/04 1455) Weight:  [104.4 kg (230 lb 2.6 oz)] 104.4 kg (230 lb 2.6 oz) (09/04 0542) Last BM Date: 04/04/13  Weight change: Filed Weights   04/03/13 0457 04/04/13 0537 04/05/13 0542  Weight: 102.468 kg (225 lb 14.4 oz) 104.146 kg (229 lb 9.6 oz) 104.4 kg (230 lb 2.6 oz)    Intake/Output:   Intake/Output Summary (Last 24 hours) at 04/05/13 1652 Last data filed at 04/05/13 1455  Gross per 24 hour  Intake   1200 ml  Output   2201 ml  Net  -1001 ml     Physical Exam: General:  Obese.  Lying in bed No resp difficulty HEENT: normal Neck: supple. JVP unable to see well.. Carotids 2+ bilat; no bruits. No lymphadenopathy or thryomegaly appreciated. Cor: PMI nonpalpable. Regular rate & rhythm. Soft SEM no gallop Lungs: clear anterior Abdomen: morbidly obese.  + distended. Good bowel sounds. Extremities: no cyanosis, clubbing, +  psoriatic rash, 1-2+ edema Neuro: alert & orientedx3, cranial nerves grossly intact. moves all 4 extremities w/o difficulty. Affect pleasant  Telemetry: SR  Labs: Basic Metabolic Panel:  Recent Labs Lab 04/02/13 1636 04/02/13 2300 04/03/13 0420 04/04/13 0443 04/05/13 0620  NA 138  --  137 139 138  K 4.2  --  3.9 4.2 4.3  CL 102  --  101 104 103  CO2 24  --  27 26 25   GLUCOSE 109*  --  164* 94 102*  BUN 43*  --  47* 51* 62*  CREATININE 2.27* 2.32* 2.27* 2.55* 2.71*  CALCIUM 9.0  --  9.0 9.0 8.9  MG  --   --   --  2.0  --     Liver Function Tests: No results found for this basename: AST, ALT, ALKPHOS, BILITOT, PROT, ALBUMIN,  in the last 168 hours No results found for this basename: LIPASE, AMYLASE,  in the last 168 hours No results found for this basename: AMMONIA,  in the last 168 hours  CBC:  Recent Labs Lab 04/02/13 1636 04/02/13 2300 04/03/13 0420 04/04/13 0443  WBC 13.5* 12.7* 12.4* 9.8  HGB 11.2* 11.0* 10.8* 10.6*  HCT 35.0* 33.2* 33.2* 33.2*  MCV 92.6 91.2 91.0 91.7  PLT 380 386 360 392    Cardiac Enzymes:  Recent Labs Lab 04/02/13 2218 04/03/13 0418 04/03/13 1147  TROPONINI <0.30 <0.30 <0.30    BNP: BNP (last 3 results)  Recent Labs  04/26/12 1002 04/02/13 1637  PROBNP 196.0* 2612.0*    CBG:  Recent Labs Lab 04/04/13 1555 04/04/13 2112 04/05/13 0618 04/05/13 1118 04/05/13 1623  GLUCAP 351* 138* 92 103* 137*    Coagulation Studies: No results found for this basename: LABPROT, INR,  in the last 72 hours  Other results: EKG: NSR 65. Low volts. No ST-T wave abnormalities.    Imaging: Dg Chest 2 View  04/04/2013   CLINICAL DATA:  One CHF, all.  EXAM: CHEST  2 VIEW  COMPARISON:  04/02/2013  FINDINGS: Mild cardiomegaly with vascular congestion. No focal opacities, effusions or overt edema. No acute bony abnormality.  IMPRESSION: Cardiomegaly, vascular congestion.   Electronically Signed   By: Charlett Nose   On: 04/04/2013 08:38       Medications:     Current Medications: . allopurinol  100 mg Oral Daily  . amLODipine  10 mg Oral Daily  . aspirin EC  81 mg Oral Daily  . atorvastatin  20 mg Oral Daily  . dicyclomine  10 mg Oral TID AC & HS  . fluticasone  2 spray Each Nare QHS  . heparin  5,000 Units Subcutaneous Q8H  . hydrALAZINE  10 mg Oral TID  . insulin aspart  0-20 Units Subcutaneous TID WC  . insulin aspart  8 Units Subcutaneous TID WC  . insulin detemir  70 Units Subcutaneous q morning - 10a  . isosorbide mononitrate  30 mg Oral Daily  . ketorolac  1 drop Both Eyes TID  . metolazone  2.5 mg Oral Daily  . metoprolol tartrate  12.5 mg Oral Daily  . mometasone-formoterol  2 puff Inhalation BID  . pantoprazole  80 mg  Oral Q1200  . pregabalin  100 mg Oral QPM     Infusions: . furosemide (LASIX) infusion 20 mg/hr (04/05/13 1446)      Assessment:   1. A/C diastolic HF 2. A/C renal failure 3. Cardio renal syndrome 4. Morbid obesity 5. DM 6. OSA, non-compliant with CPAP 7. Mild aortic stenosis 8. COPD/asthma 9. Acute on chronic respiratory failure 10. S/p splenectomy 2012  Plan/Discussion:    Given her size, it is difficult for me to accurately assess her volume status. She does have mild LE edema but this is not tense. She states she is up at least 10-15 pounds and that is supported by her weight graph here. Echo shows normal EF with grade 2 DD. Creatinine is 2.2 at baseline and now 2.7. Has now developed cardiorenal syndrome. We will increase lasix (start lasix gtt at 20/hr) and add metolazone. If fluid not mobilizing or renal function getting worse will likely need RHC to further sort out. May need dialysis at some point in the future.    Length of Stay: 3  Arvilla Meres 04/05/2013, 4:52 PM  Advanced Heart Failure Team Pager [phone] (M-F; 7a - 4p)  Please contact [facility] for night-coverage after hours (4p -7a ) and weekends on [email or web address]

## 2013-04-05 NOTE — Progress Notes (Signed)
Report given to receiving RN. Patient is stable. No signs or symptoms of distress or discomfort.  

## 2013-04-05 NOTE — Plan of Care (Signed)
Problem: Phase II Progression Outcomes Goal: Walk in hall or up in chair TID Outcome: Progressing Patient continues to have dyspnea on exertion.  Uses oxygen on 2L for comfort.  Discussed fluid restrictions and medication changes with patient.  She voiced understanding.  Will continue to monitor.

## 2013-04-05 NOTE — Progress Notes (Signed)
Pt is resting comfortable on bed, no family member present at this time, VSS, no c/o pain, no distress noticed. Pt still gets very SOB with minimal activity, pt continues with lasix gtt as ordered. We'll continue with POC.

## 2013-04-05 NOTE — Progress Notes (Signed)
[facility] PROGRESS NOTE  Assessment/Plan:  Acute diastolic congestive heart failure: - Weight on admission: 102.4 kg increase to 104.1 kg ( back on 3.20.2014 was 89.3 kg) - She has positive JVD with lower extremity edema, ECHO showed diastolic heart failure. - She relates at home she is not compliant with fluid or salt restriction. - Cont lasix- follow daily weights strict I and O's. - Consult advance heart failure team.  RENAL INSUFFICIENCY - Baseline Cr 1.4. Mild trend up. - ? Due to cardiorenal syndrome. - Una 50 and U Cr. 110  OBESITY - counseling.   OBSTRUCTIVE SLEEP APNEA - cont C-pap at home dose.   DM - Not complaint with diet, most likely the reason her BG went up. - d/cing prednisone.   HYPERTENSION - d/c clonidine and Norvasc. - cont lasix, metoprolol and imdur and hydralazine.    Code Status: full Family Communication: none  Disposition Plan: inpatinet   Consultants:  none  Procedures:  echo  Antibiotics:  Azithromycin  HPI/Subjective: She relates she feel about the same.  Objective: Filed Vitals:   04/05/13 0542 04/05/13 0826 04/05/13 0829 04/05/13 0954  BP: 138/63  118/74 133/51  Pulse: 62  62 64  Temp: 97.8 F (36.6 C)     TempSrc: Oral     Resp: 18     Height:      Weight: 104.4 kg (230 lb 2.6 oz)     SpO2: 100% 94%      Intake/Output Summary (Last 24 hours) at 04/05/13 1242 Last data filed at 04/05/13 1119  Gross per 24 hour  Intake   1200 ml  Output   2476 ml  Net  -1276 ml   Filed Weights   04/03/13 0457 04/04/13 0537 04/05/13 0542  Weight: 102.468 kg (225 lb 14.4 oz) 104.146 kg (229 lb 9.6 oz) 104.4 kg (230 lb 2.6 oz)    Exam:  General: Alert, awake, oriented x3, in no acute distress.  HEENT: No bruits, no goiter. +JVD Heart: Regular rate and rhythm, without murmurs, rubs, gallops.  Anasarca. Lungs: Good air movement, crackles a bases Abdomen: Soft, nontender, nondistended, positive bowel sounds. +3 edema  on her abdomen and sacrum.    Data Reviewed: Basic Metabolic Panel:  Recent Labs Lab 04/02/13 1636 04/02/13 2300 04/03/13 0420 04/04/13 0443 04/05/13 0620  NA 138  --  137 139 138  K 4.2  --  3.9 4.2 4.3  CL 102  --  101 104 103  CO2 24  --  27 26 25   GLUCOSE 109*  --  164* 94 102*  BUN 43*  --  47* 51* 62*  CREATININE 2.27* 2.32* 2.27* 2.55* 2.71*  CALCIUM 9.0  --  9.0 9.0 8.9  MG  --   --   --  2.0  --    Liver Function Tests: No results found for this basename: AST, ALT, ALKPHOS, BILITOT, PROT, ALBUMIN,  in the last 168 hours No results found for this basename: LIPASE, AMYLASE,  in the last 168 hours No results found for this basename: AMMONIA,  in the last 168 hours CBC:  Recent Labs Lab 04/02/13 1636 04/02/13 2300 04/03/13 0420 04/04/13 0443  WBC 13.5* 12.7* 12.4* 9.8  HGB 11.2* 11.0* 10.8* 10.6*  HCT 35.0* 33.2* 33.2* 33.2*  MCV 92.6 91.2 91.0 91.7  PLT 380 386 360 392   Cardiac Enzymes:  Recent Labs Lab 04/02/13 2218 04/03/13 0418 04/03/13 1147  TROPONINI <0.30 <0.30 <0.30   BNP (last 3  results)  Recent Labs  04/26/12 1002 04/02/13 1637  PROBNP 196.0* 2612.0*   CBG:  Recent Labs Lab 04/04/13 1058 04/04/13 1555 04/04/13 2112 04/05/13 0618 04/05/13 1118  GLUCAP 309* 351* 138* 92 103*    No results found for this or any previous visit (from the past 240 hour(s)).   Studies: Dg Chest 2 View  04/04/2013   CLINICAL DATA:  One CHF, all.  EXAM: CHEST  2 VIEW  COMPARISON:  04/02/2013  FINDINGS: Mild cardiomegaly with vascular congestion. No focal opacities, effusions or overt edema. No acute bony abnormality.  IMPRESSION: Cardiomegaly, vascular congestion.   Electronically Signed   By: Charlett Nose   On: 04/04/2013 08:38    Scheduled Meds: . allopurinol  100 mg Oral Daily  . amLODipine  10 mg Oral Daily  . aspirin EC  81 mg Oral Daily  . atorvastatin  20 mg Oral Daily  . dicyclomine  10 mg Oral TID AC & HS  . fluticasone  2 spray Each  Nare QHS  . furosemide  40 mg Intravenous BID  . heparin  5,000 Units Subcutaneous Q8H  . hydrALAZINE  10 mg Oral TID  . insulin aspart  0-20 Units Subcutaneous TID WC  . insulin aspart  8 Units Subcutaneous TID WC  . insulin detemir  70 Units Subcutaneous q morning - 10a  . isosorbide mononitrate  30 mg Oral Daily  . ketorolac  1 drop Both Eyes TID  . metoprolol tartrate  12.5 mg Oral Daily  . mometasone-formoterol  2 puff Inhalation BID  . pantoprazole  80 mg Oral Q1200  . pregabalin  100 mg Oral QPM   Continuous Infusions:    Marinda Elk  [facility] Pager [phone]. If 8PM-8AM, please contact night-coverage at [email or web address], password [facility] 04/05/2013, 12:42 PM  LOS: 3 days

## 2013-04-06 ENCOUNTER — Inpatient Hospital Stay (HOSPITAL_COMMUNITY): Payer: Medicare Other

## 2013-04-06 DIAGNOSIS — G4733 Obstructive sleep apnea (adult) (pediatric): Secondary | ICD-10-CM

## 2013-04-06 LAB — BASIC METABOLIC PANEL
Calcium: 9.7 mg/dL (ref 8.4–10.5)
Chloride: 99 mEq/L (ref 96–112)
Creatinine, Ser: 2.85 mg/dL — ABNORMAL HIGH (ref 0.50–1.10)
GFR calc Af Amer: 19 mL/min — ABNORMAL LOW (ref >90)
Sodium: 139 mEq/L (ref 135–145)

## 2013-04-06 LAB — GLUCOSE, CAPILLARY

## 2013-04-06 MED ORDER — HYDRALAZINE HCL 25 MG PO TABS
25.0000 mg | ORAL_TABLET | Freq: Three times a day (TID) | ORAL | Status: DC
  Administered 2013-04-06: 10:00:00 25 mg via ORAL
  Filled 2013-04-06 (×3): qty 1

## 2013-04-06 MED ORDER — BENZONATATE 100 MG PO CAPS
100.0000 mg | ORAL_CAPSULE | Freq: Three times a day (TID) | ORAL | Status: DC | PRN
  Administered 2013-04-06 (×2): 100 mg via ORAL
  Filled 2013-04-06 (×2): qty 1

## 2013-04-06 MED ORDER — HYDRALAZINE HCL 50 MG PO TABS
50.0000 mg | ORAL_TABLET | Freq: Three times a day (TID) | ORAL | Status: DC
  Administered 2013-04-06 – 2013-04-08 (×8): 50 mg via ORAL
  Filled 2013-04-06 (×11): qty 1

## 2013-04-06 MED ORDER — GUAIFENESIN-DM 100-10 MG/5ML PO SYRP: 5.0000 mL | ORAL_SOLUTION | ORAL | Status: DC | PRN

## 2013-04-06 NOTE — Progress Notes (Signed)
[facility] PROGRESS NOTE  Assessment/Plan:  Acute diastolic congestive heart failure: - Weight on admission: 102.4 kg, now coming down 101.4 - She relates at home she is not compliant with fluid or salt restriction. - Consult advance heart failure team, start her her Lasix drip diuresing well, creatinine continues to slowly trend up.  RENAL INSUFFICIENCY - Baseline Cr 1.4. Mild trend up.  OBESITY - counseling.   OBSTRUCTIVE SLEEP APNEA - cont C-pap at home dose.   DM - Not complaint with diet, most likely the reason her BG went up. - d/cing prednisone.   HYPERTENSION - cont lasix, metoprolol and imdur and increase hydralazine.    Code Status: full Family Communication: none  Disposition Plan: inpatinet   Consultants:  cardiology  Procedures:  echo  Antibiotics:  Azithromycin  HPI/Subjective: She relates she feel about the same.  Objective: Filed Vitals:   04/05/13 2046 04/06/13 0345 04/06/13 0618 04/06/13 1015  BP: 124/59  148/60 151/85  Pulse: 64  79 77  Temp: 98 F (36.7 C)  98.6 F (37 C) 98.7 F (37.1 C)  TempSrc: Oral  Oral Oral  Resp: 18  20 20   Height:      Weight:   101.424 kg (223 lb 9.6 oz)   SpO2: 100% 98% 100% 91%    Intake/Output Summary (Last 24 hours) at 04/06/13 1230 Last data filed at 04/06/13 1145  Gross per 24 hour  Intake    956 ml  Output   5700 ml  Net  -4744 ml   Filed Weights   04/04/13 0537 04/05/13 0542 04/06/13 0618  Weight: 104.146 kg (229 lb 9.6 oz) 104.4 kg (230 lb 2.6 oz) 101.424 kg (223 lb 9.6 oz)    Exam:  General: Alert, awake, oriented x3, in no acute distress.  HEENT: No bruits, no goiter.  Heart: Regular rate and rhythm, without murmurs, rubs, gallops.  Anasarca. Lungs: Good air movement, crackles a bases Abdomen: Soft, nontender, nondistended, positive bowel sounds. +3 edema on her abdomen and sacrum.    Data Reviewed: Basic Metabolic Panel:  Recent Labs Lab 04/02/13 1636  04/02/13 2300 04/03/13 0420 04/04/13 0443 04/05/13 0620 04/06/13 0530  NA 138  --  137 139 138 139  K 4.2  --  3.9 4.2 4.3 4.7  CL 102  --  101 104 103 99  CO2 24  --  27 26 25 30   GLUCOSE 109*  --  164* 94 102* 164*  BUN 43*  --  47* 51* 62* 65*  CREATININE 2.27* 2.32* 2.27* 2.55* 2.71* 2.85*  CALCIUM 9.0  --  9.0 9.0 8.9 9.7  MG  --   --   --  2.0  --   --    Liver Function Tests: No results found for this basename: AST, ALT, ALKPHOS, BILITOT, PROT, ALBUMIN,  in the last 168 hours No results found for this basename: LIPASE, AMYLASE,  in the last 168 hours No results found for this basename: AMMONIA,  in the last 168 hours CBC:  Recent Labs Lab 04/02/13 1636 04/02/13 2300 04/03/13 0420 04/04/13 0443  WBC 13.5* 12.7* 12.4* 9.8  HGB 11.2* 11.0* 10.8* 10.6*  HCT 35.0* 33.2* 33.2* 33.2*  MCV 92.6 91.2 91.0 91.7  PLT 380 386 360 392   Cardiac Enzymes:  Recent Labs Lab 04/02/13 2218 04/03/13 0418 04/03/13 1147  TROPONINI <0.30 <0.30 <0.30   BNP (last 3 results)  Recent Labs  04/26/12 1002 04/02/13 1637  PROBNP 196.0* 2612.0*  CBG:  Recent Labs Lab 04/05/13 1118 04/05/13 1623 04/05/13 2133 04/06/13 0546 04/06/13 1144  GLUCAP 103* 137* 108* 143* 106*    No results found for this or any previous visit (from the past 240 hour(s)).   Studies: Dg Chest 2 View  04/06/2013   *RADIOLOGY REPORT*  Clinical Data: Pulmonary edema  CHEST - 2 VIEW  Comparison: Prior chest x-ray 04/04/2013  Findings: Stable cardiac and mediastinal contours.  Stable mild pulmonary vascular congestion without overt edema.  Left basilar subsegmental atelectasis is unchanged.  No pneumothorax or pleural effusion.  Central and lower lobe bronchial wall thickening. Surgical clips noted in the right upper quadrant and left upper quadrant.  IMPRESSION:  1.  No significant interval change compared to 04/04/2013. 2.  Persistent central and bilateral lower lobe bronchitic changes, mild pulmonary  vascular congestion and left basilar atelectasis versus infiltrate.   Original Report Authenticated By: Malachy Moan, M.D.    Scheduled Meds: . allopurinol  100 mg Oral Daily  . amLODipine  10 mg Oral Daily  . aspirin EC  81 mg Oral Daily  . atorvastatin  20 mg Oral Daily  . dicyclomine  10 mg Oral TID AC & HS  . fluticasone  2 spray Each Nare QHS  . heparin  5,000 Units Subcutaneous Q8H  . hydrALAZINE  25 mg Oral TID  . insulin aspart  0-20 Units Subcutaneous TID WC  . insulin aspart  8 Units Subcutaneous TID WC  . insulin detemir  70 Units Subcutaneous q morning - 10a  . isosorbide mononitrate  30 mg Oral Daily  . ketorolac  1 drop Both Eyes TID  . metoprolol tartrate  12.5 mg Oral Daily  . mometasone-formoterol  2 puff Inhalation BID  . pantoprazole  80 mg Oral Q1200  . pregabalin  100 mg Oral QPM   Continuous Infusions: . furosemide (LASIX) infusion 20 mg/hr (04/06/13 1610)     Marinda Elk  [facility] Pager [phone]. If 8PM-8AM, please contact night-coverage at [email or web address], password [facility] 04/06/2013, 12:30 PM  LOS: 4 days

## 2013-04-06 NOTE — Clinical Documentation Improvement (Signed)
THIS DOCUMENT IS NOT A PERMANENT PART OF THE MEDICAL RECORD  Please update your documentation with the medical record to reflect your response to this query. If you need help knowing how to do this please call [phone].  04/06/13   Dr. David Stall,  In a better effort to capture your patient's severity of illness, reflect appropriate length of stay and utilization of resources, a review of the patient medical record has revealed the following indicators:   - Known history of CKD per H&P   - Known history of Diabetes Mellitis per H&P   - Known history of unspecified essential hypertension per H&P   - GFR  (black female) per [facility] Review have ranged from the 40's to 20's since 08/2011   - Follows with Dr. Caryn Section per H&P   Based on your clinical judgment, please document the STAGE and any Associated Conditions of the patient's CKD in the progress notes and discharge summary:   - CKD Stage I -  GFR > OR = 90  - CKD Stage II - GFR 60-89  - CKD Stage III - GFR 30-59  - CKD Stage IV - GFR 15-29  - CKD Stage V - GFR < 15  - ESRD (End Stage Renal Disease)  - Other Condition  - Unable to Clinically Determine    In responding to this query please exercise your independent judgment.    The fact that a query is asked, does not imply that any particular answer is desired or expected.   Reviewed: I do not know yet what her baseline cr is?   Thank You,  [name] Ralph  RN BSN CCDS Certified Clinical Documentation Specialist: [phone] Health Information Management [place]

## 2013-04-06 NOTE — Progress Notes (Addendum)
Weight Range   Baseline proBNP     HPI: [name] is a 67 y.o. female with hx of morbid obesity, sleep apnea (noncompliant with CPAP), asthma/COPD, chronic prediosne use for sarcoidosis, CRI (baseline Cr 2.2- chronic diastolic CHF, CAD and DM She has been followed by Dr Kriste Basque for pulmonary and Dr Daleen Squibb for cardiology.  Yesterday she was started on lasix drip at 20 mg per hour and Metolazone 2.5 mg daily was added for volume overload. Brisk diuresisWeight down 7 pounds.   Denies SOB. + Orthopnea.   Echo 04/03/13: EF 60-65% grade 2 DD. Mild AS. RV normal.   Creatinine 2.7>2.8     ROS: All systems negative except as listed in HPI, PMH and Problem List.  Past Medical History  Diagnosis Date  . Unspecified essential hypertension   . CAD (coronary artery disease) 01/04/2006    Tiny OM1 70-90% ostial stenosis, no other CAD  . Asthma   . Esophageal reflux   . Obesity, unspecified   . Anxiety state, unspecified   . Hypercalcemia   . Anemia   . Other psoriasis   . Diverticulosis   . Sarcoidosis 2012  . Dyslipidemia   . COPD (chronic obstructive pulmonary disease)   . Gout   . Colon polyps     Tubular adenomatous polyps  . Spinal stenosis of lumbar region   . Bulging lumbar disc   . CHF (congestive heart failure)   . PNA (pneumonia) 2012    With pleural effusion, requiring thoracentesis  . Pneumonia     "several times" (04/02/2013)  . Pleural effusion 2012  . Chronic bronchitis   . Exertional shortness of breath     "and lying flat" (04/02/2013)  . On home oxygen therapy     "2L when I'm up; 3L when I'm at sleep" (04/02/2013)  . OSA (obstructive sleep apnea)     marginally compliant with CPAP  . Type II diabetes mellitus   . Degenerative joint disease   . Arthritis     'all over" (04/02/2013)  . Chronic lower back pain   . Kidney disease     baseline creatinie 2.0,  folowed by Dr Caryn Section  . Renal failure     "kidney's are working @ 50%" (04/02/2013)  . Diverticulosis   . GERD  (gastroesophageal reflux disease)   . Anxiety     Current Facility-Administered Medications  Medication Dose Route Frequency Provider Last Rate Last Dose  . albuterol (PROVENTIL HFA;VENTOLIN HFA) 108 (90 BASE) MCG/ACT inhaler 2 puff  2 puff Inhalation Q6H PRN Houston Siren, MD      . ipratropium (ATROVENT) nebulizer solution 0.5 mg  0.5 mg Nebulization Q6H PRN Houston Siren, MD   0.5 mg at 04/06/13 0343   And  . albuterol (PROVENTIL) (5 MG/ML) 0.5% nebulizer solution 2.5 mg  2.5 mg Nebulization Q6H PRN Houston Siren, MD   2.5 mg at 04/06/13 0343  . allopurinol (ZYLOPRIM) tablet 100 mg  100 mg Oral Daily Houston Siren, MD   100 mg at 04/05/13 0954  . amLODipine (NORVASC) tablet 10 mg  10 mg Oral Daily Houston Siren, MD   10 mg at 04/05/13 0954  . aspirin EC tablet 81 mg  81 mg Oral Daily Houston Siren, MD   81 mg at 04/05/13 0954  . atorvastatin (LIPITOR) tablet 20 mg  20 mg Oral Daily Houston Siren, MD   20 mg at 04/05/13 0954  . benzonatate (TESSALON) capsule 100 mg  100 mg Oral TID PRN  Rolan Lipa, NP   100 mg at 04/06/13 4098  . cyclobenzaprine (FLEXERIL) tablet 5 mg  5 mg Oral TID PRN Leroy Sea, MD   5 mg at 04/03/13 2228  . dicyclomine (BENTYL) capsule 10 mg  10 mg Oral TID AC & HS Houston Siren, MD   10 mg at 04/06/13 0630  . fluticasone (FLONASE) 50 MCG/ACT nasal spray 2 spray  2 spray Each Nare QHS Houston Siren, MD   2 spray at 04/05/13 2138  . furosemide (LASIX) 250 mg in dextrose 5 % 250 mL infusion  20 mg/hr Intravenous Continuous Amy D Clegg, NP 20 mL/hr at 04/06/13 0633 20 mg/hr at 04/06/13 0633  . heparin injection 5,000 Units  5,000 Units Subcutaneous Q8H Houston Siren, MD   5,000 Units at 04/06/13 [phone]  . hydrALAZINE (APRESOLINE) tablet 10 mg  10 mg Oral TID Houston Siren, MD   10 mg at 04/05/13 2138  . HYDROcodone-acetaminophen (NORCO/VICODIN) 5-325 MG per tablet 2 tablet  2 tablet Oral Q4H PRN Houston Siren, MD      . insulin aspart (novoLOG) injection 0-20 Units  0-20 Units Subcutaneous TID WC Houston Siren, MD   3  Units at 04/06/13 0700  . insulin aspart (novoLOG) injection 8 Units  8 Units Subcutaneous TID WC Leroy Sea, MD   8 Units at 04/05/13 1737  . insulin detemir (LEVEMIR) injection 70 Units  70 Units Subcutaneous q morning - 10a Houston Siren, MD   70 Units at 04/05/13 [phone]  . isosorbide mononitrate (IMDUR) 24 hr tablet 30 mg  30 mg Oral Daily Leroy Sea, MD   30 mg at 04/05/13 0954  . ketorolac (ACULAR) 0.5 % ophthalmic solution 1 drop  1 drop Both Eyes TID Houston Siren, MD   1 drop at 04/05/13 2138  . metolazone (ZAROXOLYN) tablet 2.5 mg  2.5 mg Oral Daily Amy D Clegg, NP   2.5 mg at 04/05/13 1550  . metoprolol tartrate (LOPRESSOR) tablet 12.5 mg  12.5 mg Oral Daily Houston Siren, MD   12.5 mg at 04/05/13 0954  . mometasone-formoterol (DULERA) 100-5 MCG/ACT inhaler 2 puff  2 puff Inhalation BID Houston Siren, MD   2 puff at 04/05/13 2019  . ondansetron (ZOFRAN-ODT) disintegrating tablet 4 mg  4 mg Oral Q8H PRN Houston Siren, MD      . pantoprazole (PROTONIX) EC tablet 80 mg  80 mg Oral Q1200 Houston Siren, MD   80 mg at 04/05/13 0959  . pregabalin (LYRICA) capsule 100 mg  100 mg Oral QPM Houston Siren, MD   100 mg at 04/05/13 1738     PHYSICAL EXAM: Filed Vitals:   04/05/13 2019 04/05/13 2046 04/06/13 0345 04/06/13 0618  BP:  124/59  148/60  Pulse:  64  79  Temp:  98 F (36.7 C)  98.6 F (37 C)  TempSrc:  Oral  Oral  Resp:  18  20  Height:      Weight:    223 lb 9.6 oz (101.424 kg)  SpO2: 97% 100% 98% 100%    General:  Well appearing. No resp difficulty HEENT: normal Neck: supple. JVP hard to tell due to body habitus. Carotids 2+ bilaterally; no bruits. No lymphadenopathy or thryomegaly appreciated. Cor: PMI normal. Regular rate & rhythm. No rubs, gallops or murmurs. Lungs: clear Abdomen: obese, soft, nontender, nondistended. No hepatosplenomegaly. No bruits or masses. Good bowel sounds. Extremities: no cyanosis, clubbing, rash, R and LLLE 1+ edema Neuro: alert & orientedx3, cranial  nerves grossly  intact. Moves all 4 extremities w/o difficulty. Affect pleasant.      ASSESSMENT & PLAN: 1. A/C diastolic HF  2. A/C renal failure  3. Cardio renal syndrome  4. Morbid obesity  5. DM  6. OSA, non-compliant with CPAP  7. Mild aortic stenosis  8. COPD/asthma  9. 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 We discussed all aspects of the encounter. I agree with the assessment and plan as stated above.   Volume status improving. Brisk diuresis noted. Weight down 7 pounds.  However creatinine trending up slowly. Urine still very clear. Will decrease lasix drip to 15/hr.  Likely close to baseline weight. Will need renal f/u as outpatient. On discharge would favor switching lasix for to demadex 20 bid or so.   Consult cardiac rehab and dietitian.    Joesph Marcy,MD 10:02 AM

## 2013-04-06 NOTE — Progress Notes (Signed)
Pt resting on bed, having some cramping on BLE, some stretching exercise done with the pt with relieve. Pt refuses to get any pain medication at this time. Coughing medication given for continues dry cough with relieve.  Pt continues on Lasix gtt at 15 mL/ hr as ordered. We'll continue with POC.

## 2013-04-06 NOTE — Progress Notes (Signed)
Pt. Refused cpap. Pt. States she will just wear her oxygen.

## 2013-04-06 NOTE — Plan of Care (Signed)
Problem: Food- and Nutrition-Related Knowledge Deficit (NB-1.1) Goal: Nutrition education Formal process to instruct or train a patient/client in a skill or to impart knowledge to help patients/clients voluntarily manage or modify food choices and eating behavior to maintain or improve health. Outcome: Completed/Met Date Met:  04/06/13 Nutrition Education Note  RD consulted for nutrition education regarding new onset CHF.  RD provided "Low Sodium Nutrition Therapy" handout from the [employer]. Reviewed patient's dietary recall. Provided examples on ways to decrease sodium intake in diet. Discouraged intake of processed foods and use of salt shaker. Encouraged fresh fruits and vegetables as well as whole grain sources of carbohydrates to maximize fiber intake.   RD discussed why it is important for patient to adhere to diet recommendations, and emphasized the role of fluids, foods to avoid, and importance of weighing self daily. Teach back method used.  Expect fair compliance.  Body mass index is 40.89 kg/(m^2). Pt meets criteria for Obese Class III based on current BMI.  Current diet order is Carbohydrate Modified Medium, patient is consuming approximately 100% of meals at this time. Labs and medications reviewed. No further nutrition interventions warranted at this time. RD contact information provided. If additional nutrition issues arise, please re-consult RD.   [name] Motto MS, RD, LDN Pager: [phone] After-hours pager: [phone]

## 2013-04-07 DIAGNOSIS — E669 Obesity, unspecified: Secondary | ICD-10-CM

## 2013-04-07 LAB — BASIC METABOLIC PANEL
CO2: 30 mEq/L (ref 19–32)
Chloride: 98 mEq/L (ref 96–112)
Glucose, Bld: 156 mg/dL — ABNORMAL HIGH (ref 70–99)
Sodium: 140 mEq/L (ref 135–145)

## 2013-04-07 LAB — GLUCOSE, CAPILLARY
Glucose-Capillary: 157 mg/dL — ABNORMAL HIGH (ref 70–99)
Glucose-Capillary: 272 mg/dL — ABNORMAL HIGH (ref 70–99)
Glucose-Capillary: 211 mg/dL — ABNORMAL HIGH (ref 70–99)

## 2013-04-07 MED ORDER — ONDANSETRON HCL 4 MG/2ML IJ SOLN
INTRAMUSCULAR | Status: AC
  Administered 2013-04-07: 4 mg
  Filled 2013-04-07: qty 2

## 2013-04-07 MED ORDER — TORSEMIDE 20 MG PO TABS
20.0000 mg | ORAL_TABLET | Freq: Two times a day (BID) | ORAL | Status: DC
  Administered 2013-04-07 (×2): 20 mg via ORAL
  Filled 2013-04-07 (×5): qty 1

## 2013-04-07 MED ORDER — ONDANSETRON HCL 4 MG/2ML IJ SOLN: 4.0000 mg | Freq: Four times a day (QID) | INTRAMUSCULAR | Status: DC | PRN

## 2013-04-07 NOTE — Progress Notes (Signed)
HPI: [name] is a 67 y.o. female with hx of morbid obesity, sleep apnea (noncompliant with CPAP), asthma/COPD, chronic prediosne use for sarcoidosis, CRI (baseline Cr 2.2- chronic diastolic CHF, CAD and DM She has been followed by Dr Kriste Basque for pulmonary and Dr Daleen Squibb for cardiology.  SHe has had robust response to IV diuresis.  SHe lost another 5 lbs yesterday.  She reports that she feels "much better"  Denies SOB. + Orthopnea.   Echo 04/03/13: EF 60-65% grade 2 DD. Mild AS. RV normal.   Creatinine 2.7>2.8 >2.9  ROS: All systems negative except as listed in HPI, PMH and Problem List.  Past Medical History  Diagnosis Date  . Unspecified essential hypertension   . CAD (coronary artery disease) 01/04/2006    Tiny OM1 70-90% ostial stenosis, no other CAD  . Asthma   . Esophageal reflux   . Obesity, unspecified   . Anxiety state, unspecified   . Hypercalcemia   . Anemia   . Other psoriasis   . Diverticulosis   . Sarcoidosis 2012  . Dyslipidemia   . COPD (chronic obstructive pulmonary disease)   . Gout   . Colon polyps     Tubular adenomatous polyps  . Spinal stenosis of lumbar region   . Bulging lumbar disc   . CHF (congestive heart failure)   . PNA (pneumonia) 2012    With pleural effusion, requiring thoracentesis  . Pneumonia     "several times" (04/02/2013)  . Pleural effusion 2012  . Chronic bronchitis   . Exertional shortness of breath     "and lying flat" (04/02/2013)  . On home oxygen therapy     "2L when I'm up; 3L when I'm at sleep" (04/02/2013)  . OSA (obstructive sleep apnea)     marginally compliant with CPAP  . Type II diabetes mellitus   . Degenerative joint disease   . Arthritis     'all over" (04/02/2013)  . Chronic lower back pain   . Kidney disease     baseline creatinie 2.0,  folowed by Dr Caryn Section  . Renal failure     "kidney's are working @ 50%" (04/02/2013)  . Diverticulosis   . GERD (gastroesophageal reflux disease)   . Anxiety     Current  Facility-Administered Medications  Medication Dose Route Frequency Provider Last Rate Last Dose  . albuterol (PROVENTIL HFA;VENTOLIN HFA) 108 (90 BASE) MCG/ACT inhaler 2 puff  2 puff Inhalation Q6H PRN Houston Siren, MD      . ipratropium (ATROVENT) nebulizer solution 0.5 mg  0.5 mg Nebulization Q6H PRN Houston Siren, MD   0.5 mg at 04/06/13 0343   And  . albuterol (PROVENTIL) (5 MG/ML) 0.5% nebulizer solution 2.5 mg  2.5 mg Nebulization Q6H PRN Houston Siren, MD   2.5 mg at 04/06/13 0343  . allopurinol (ZYLOPRIM) tablet 100 mg  100 mg Oral Daily Houston Siren, MD   100 mg at 04/07/13 1102  . amLODipine (NORVASC) tablet 10 mg  10 mg Oral Daily Houston Siren, MD   10 mg at 04/07/13 1102  . aspirin EC tablet 81 mg  81 mg Oral Daily Houston Siren, MD   81 mg at 04/07/13 1102  . atorvastatin (LIPITOR) tablet 20 mg  20 mg Oral Daily Houston Siren, MD   20 mg at 04/07/13 1101  . benzonatate (TESSALON) capsule 100 mg  100 mg Oral TID PRN Rolan Lipa, NP   100 mg at 04/06/13 2158  . cyclobenzaprine (FLEXERIL) tablet 5  mg  5 mg Oral TID PRN Leroy Sea, MD   5 mg at 04/03/13 2228  . dicyclomine (BENTYL) capsule 10 mg  10 mg Oral TID AC & HS Houston Siren, MD   10 mg at 04/07/13 1103  . fluticasone (FLONASE) 50 MCG/ACT nasal spray 2 spray  2 spray Each Nare QHS Houston Siren, MD   2 spray at 04/06/13 2158  . furosemide (LASIX) 250 mg in dextrose 5 % 250 mL infusion  15 mg/hr Intravenous Continuous Dolores Patty, MD 15 mL/hr at 04/07/13 0647 15 mg/hr at 04/07/13 0647  . guaiFENesin-dextromethorphan (ROBITUSSIN DM) 100-10 MG/5ML syrup 5 mL  5 mL Oral Q4H PRN Amy D Clegg, NP      . heparin injection 5,000 Units  5,000 Units Subcutaneous Q8H Houston Siren, MD   5,000 Units at 04/07/13 1610  . hydrALAZINE (APRESOLINE) tablet 50 mg  50 mg Oral TID Marinda Elk, MD   50 mg at 04/07/13 1103  . HYDROcodone-acetaminophen (NORCO/VICODIN) 5-325 MG per tablet 2 tablet  2 tablet Oral Q4H PRN Houston Siren, MD   2 tablet at 04/07/13 0636  .  insulin aspart (novoLOG) injection 0-20 Units  0-20 Units Subcutaneous TID WC Houston Siren, MD   4 Units at 04/07/13 [phone]  . insulin aspart (novoLOG) injection 8 Units  8 Units Subcutaneous TID WC Leroy Sea, MD   8 Units at 04/07/13 1105  . insulin detemir (LEVEMIR) injection 70 Units  70 Units Subcutaneous q morning - 10a Houston Siren, MD   70 Units at 04/07/13 1102  . isosorbide mononitrate (IMDUR) 24 hr tablet 30 mg  30 mg Oral Daily Leroy Sea, MD   30 mg at 04/07/13 1102  . ketorolac (ACULAR) 0.5 % ophthalmic solution 1 drop  1 drop Both Eyes TID Houston Siren, MD   1 drop at 04/07/13 1107  . metoprolol tartrate (LOPRESSOR) tablet 12.5 mg  12.5 mg Oral Daily Houston Siren, MD   12.5 mg at 04/07/13 1101  . mometasone-formoterol (DULERA) 100-5 MCG/ACT inhaler 2 puff  2 puff Inhalation BID Houston Siren, MD   2 puff at 04/07/13 0845  . ondansetron (ZOFRAN-ODT) disintegrating tablet 4 mg  4 mg Oral Q8H PRN Houston Siren, MD      . pantoprazole (PROTONIX) EC tablet 80 mg  80 mg Oral Q1200 Houston Siren, MD   80 mg at 04/06/13 1030  . pregabalin (LYRICA) capsule 100 mg  100 mg Oral QPM Houston Siren, MD   100 mg at 04/05/13 1738     PHYSICAL EXAM: Filed Vitals:   04/06/13 2006 04/06/13 2117 04/07/13 0549 04/07/13 0846  BP:  126/54 147/54   Pulse:  76 88   Temp:  98.6 F (37 C) 98.6 F (37 C)   TempSrc:  Oral Oral   Resp:  20 22   Height:      Weight:   218 lb 7.6 oz (99.1 kg)   SpO2: 97% 96% 100% 94%    General:  Well appearing. No resp difficulty HEENT: OP clear Neck: supple. JVP hard to tell due to body habitus. Carotids 2+ bilaterally; no bruits. No lymphadenopathy or thryomegaly appreciated. Cor: PMI normal. Regular rate & rhythm. No rubs, gallops or murmurs. Lungs: clear Abdomen: obese, soft, nontender, nondistended. No hepatosplenomegaly. No bruits or masses. Good bowel sounds. Extremities: no cyanosis, clubbing, rash, R and LLLE 1+ edema Neuro: alert & orientedx3, cranial nerves grossly intact. Moves  all 4 extremities w/o difficulty. Affect  pleasant.   ASSESSMENT & PLAN: 1. A/C diastolic HF  2. A/C renal failure  3. Cardio renal syndrome  4. Morbid obesity  5. DM  6. OSA, non-compliant with CPAP  7. Mild aortic stenosis  8. COPD/asthma  9. 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  I think that she is likely close to baseline weight. Will need renal f/u as outpatient.   Will go ahead and switch lasix to demadex 20 bid and follow response.     Consult cardiac rehab and dietitian.    Jossalin Chervenak,MD 11:20 AM

## 2013-04-07 NOTE — Progress Notes (Signed)
CARDIAC REHAB PHASE I    10:00am-10:44am Patient was educated with heart failure booklet, nutrition, and exercise.  Patient started to feel nauseous and gagging during education so we decided not to ambulate.  Patient is interested in Cardiac Rehab. Patient left in chair with call bell in reach.  Nurse was alerted about nausea.   Theresa Duty, [place] 04/07/2013 10:42 AM

## 2013-04-07 NOTE — Progress Notes (Signed)
[facility] PROGRESS NOTE  Assessment/Plan:  Acute diastolic congestive heart failure: - Weight on admission: 102.4 kg ->99.1 kg. - She relates at home she is not compliant with fluid or salt restriction. - change lasix to demadex.  RENAL INSUFFICIENCY - Baseline Cr 2.5.  OBESITY - counseling.   OBSTRUCTIVE SLEEP APNEA - cont C-pap at home dose.   DM - Not complaint with diet, most likely the reason her BG went up. - d/cing prednisone.   HYPERTENSION - cont lasix, metoprolol and imdur and increase hydralazine.    Code Status: full Family Communication: none  Disposition Plan: inpatinet   Consultants:  cardiology  Procedures:  echo  Antibiotics:  Azithromycin  HPI/Subjective: Feel at baseline.   Objective: Filed Vitals:   04/06/13 2006 04/06/13 2117 04/07/13 0549 04/07/13 0846  BP:  126/54 147/54   Pulse:  76 88   Temp:  98.6 F (37 C) 98.6 F (37 C)   TempSrc:  Oral Oral   Resp:  20 22   Height:      Weight:   99.1 kg (218 lb 7.6 oz)   SpO2: 97% 96% 100% 94%    Intake/Output Summary (Last 24 hours) at 04/07/13 1302 Last data filed at 04/07/13 1000  Gross per 24 hour  Intake    958 ml  Output   4500 ml  Net  -3542 ml   Filed Weights   04/05/13 0542 04/06/13 0618 04/07/13 0549  Weight: 104.4 kg (230 lb 2.6 oz) 101.424 kg (223 lb 9.6 oz) 99.1 kg (218 lb 7.6 oz)    Exam:  General: Alert, awake, oriented x3, in no acute distress.  HEENT: No bruits, no goiter.  Heart: Regular rate and rhythm, without murmurs, rubs, gallops.  Anasarca. Lungs: Good air movement, crackles a bases Abdomen: Soft, nontender, nondistended, positive bowel sounds.    Data Reviewed: Basic Metabolic Panel:  Recent Labs Lab 04/03/13 0420 04/04/13 0443 04/05/13 0620 04/06/13 0530 04/07/13 0414  NA 137 139 138 139 140  K 3.9 4.2 4.3 4.7 4.4  CL 101 104 103 99 98  CO2 27 26 25 30 30   GLUCOSE 164* 94 102* 164* 156*  BUN 47* 51* 62* 65* 72*  CREATININE  2.27* 2.55* 2.71* 2.85* 2.93*  CALCIUM 9.0 9.0 8.9 9.7 10.0  MG  --  2.0  --   --   --    Liver Function Tests: No results found for this basename: AST, ALT, ALKPHOS, BILITOT, PROT, ALBUMIN,  in the last 168 hours No results found for this basename: LIPASE, AMYLASE,  in the last 168 hours No results found for this basename: AMMONIA,  in the last 168 hours CBC:  Recent Labs Lab 04/02/13 1636 04/02/13 2300 04/03/13 0420 04/04/13 0443  WBC 13.5* 12.7* 12.4* 9.8  HGB 11.2* 11.0* 10.8* 10.6*  HCT 35.0* 33.2* 33.2* 33.2*  MCV 92.6 91.2 91.0 91.7  PLT 380 386 360 392   Cardiac Enzymes:  Recent Labs Lab 04/02/13 2218 04/03/13 0418 04/03/13 1147  TROPONINI <0.30 <0.30 <0.30   BNP (last 3 results)  Recent Labs  04/26/12 1002 04/02/13 1637  PROBNP 196.0* 2612.0*   CBG:  Recent Labs Lab 04/06/13 1144 04/06/13 1615 04/06/13 2115 04/07/13 0546 04/07/13 1103  GLUCAP 106* 253* 124* 157* 272*    No results found for this or any previous visit (from the past 240 hour(s)).   Studies: Dg Chest 2 View  04/06/2013   *RADIOLOGY REPORT*  Clinical Data: Pulmonary edema  CHEST -  2 VIEW  Comparison: Prior chest x-ray 04/04/2013  Findings: Stable cardiac and mediastinal contours.  Stable mild pulmonary vascular congestion without overt edema.  Left basilar subsegmental atelectasis is unchanged.  No pneumothorax or pleural effusion.  Central and lower lobe bronchial wall thickening. Surgical clips noted in the right upper quadrant and left upper quadrant.  IMPRESSION:  1.  No significant interval change compared to 04/04/2013. 2.  Persistent central and bilateral lower lobe bronchitic changes, mild pulmonary vascular congestion and left basilar atelectasis versus infiltrate.   Original Report Authenticated By: Malachy Moan, M.D.    Scheduled Meds: . allopurinol  100 mg Oral Daily  . amLODipine  10 mg Oral Daily  . aspirin EC  81 mg Oral Daily  . atorvastatin  20 mg Oral Daily  .  dicyclomine  10 mg Oral TID AC & HS  . fluticasone  2 spray Each Nare QHS  . heparin  5,000 Units Subcutaneous Q8H  . hydrALAZINE  50 mg Oral TID  . insulin aspart  0-20 Units Subcutaneous TID WC  . insulin aspart  8 Units Subcutaneous TID WC  . insulin detemir  70 Units Subcutaneous q morning - 10a  . isosorbide mononitrate  30 mg Oral Daily  . ketorolac  1 drop Both Eyes TID  . metoprolol tartrate  12.5 mg Oral Daily  . mometasone-formoterol  2 puff Inhalation BID  . pantoprazole  80 mg Oral Q1200  . pregabalin  100 mg Oral QPM  . torsemide  20 mg Oral BID   Continuous Infusions:     Marinda Elk  [facility] Pager [phone]. If 8PM-8AM, please contact night-coverage at [email or web address], password [facility] 04/07/2013, 1:02 PM  LOS: 5 days

## 2013-04-08 DIAGNOSIS — D649 Anemia, unspecified: Secondary | ICD-10-CM

## 2013-04-08 DIAGNOSIS — D869 Sarcoidosis, unspecified: Secondary | ICD-10-CM

## 2013-04-08 LAB — GLUCOSE, CAPILLARY
Glucose-Capillary: 164 mg/dL — ABNORMAL HIGH (ref 70–99)
Glucose-Capillary: 226 mg/dL — ABNORMAL HIGH (ref 70–99)

## 2013-04-08 LAB — BASIC METABOLIC PANEL
BUN: 75 mg/dL — ABNORMAL HIGH (ref 6–23)
Creatinine, Ser: 3.41 mg/dL — ABNORMAL HIGH (ref 0.50–1.10)
GFR calc non Af Amer: 13 mL/min — ABNORMAL LOW (ref >90)
Glucose, Bld: 214 mg/dL — ABNORMAL HIGH (ref 70–99)
Potassium: 4.1 mEq/L (ref 3.5–5.1)

## 2013-04-08 MED ORDER — SODIUM CHLORIDE 0.9 % IV BOLUS (SEPSIS)
250.0000 mL | Freq: Once | INTRAVENOUS | Status: AC
  Administered 2013-04-08: 250 mL via INTRAVENOUS

## 2013-04-08 MED ORDER — ACETAMINOPHEN 325 MG PO TABS
650.0000 mg | ORAL_TABLET | Freq: Four times a day (QID) | ORAL | Status: DC | PRN
  Administered 2013-04-08 – 2013-04-09 (×3): 650 mg via ORAL
  Filled 2013-04-08 (×4): qty 2

## 2013-04-08 NOTE — Progress Notes (Signed)
Patient has low grade temperature of 100.3. [name] notified, received order for PRN Tylenol with parameters.  Will administer if temp exceeds 101 as ordered and continue to monitor.  [name]

## 2013-04-08 NOTE — Progress Notes (Signed)
[facility] PROGRESS NOTE  Assessment/Plan:  Acute diastolic congestive heart failure: - Weight on admission: 102.4 kg ->99.1 kg. - She relates at home she is not compliant with fluid or salt restriction. - Hold lasix, allow oral intake. - increase in cr. - b-met in am.  Acute on chronic RENAL INSUFFICIENCY - Baseline Cr 2.5 -> 3.4. - due to overdiuresis. - orla hydration.   OBESITY - counseling.   OBSTRUCTIVE SLEEP APNEA - cont C-pap at home dose.   DM - Not complaint with diet, most likely the reason her BG went up. - d/cing prednisone.   HYPERTENSION - metoprolol and imdur and increase hydralazine.    Code Status: full Family Communication: none  Disposition Plan: inpatinet   Consultants:  cardiology  Procedures:  echo  Antibiotics:  Azithromycin  HPI/Subjective: Feel at baseline.   Objective: Filed Vitals:   04/07/13 0846 04/07/13 1400 04/07/13 2103 04/08/13 0514  BP:  124/62 154/48 131/46  Pulse:  78 87 98  Temp:  97.9 F (36.6 C) 99.2 F (37.3 C) 100.3 F (37.9 C)  TempSrc:  Oral Oral Oral  Resp:  20 18 20   Height:      Weight:    97.5 kg (214 lb 15.2 oz)  SpO2: 94% 100% 93% 96%    Intake/Output Summary (Last 24 hours) at 04/08/13 1257 Last data filed at 04/08/13 0855  Gross per 24 hour  Intake    360 ml  Output    900 ml  Net   -540 ml   Filed Weights   04/06/13 0618 04/07/13 0549 04/08/13 0514  Weight: 101.424 kg (223 lb 9.6 oz) 99.1 kg (218 lb 7.6 oz) 97.5 kg (214 lb 15.2 oz)    Exam:  General: Alert, awake, oriented x3, in no acute distress.  HEENT: No bruits, no goiter.  Heart: Regular rate and rhythm, without murmurs, rubs, gallops.  Anasarca. Lungs: Good air movement, clear to auscultation Abdomen: Soft, nontender, nondistended, positive bowel sounds.    Data Reviewed: Basic Metabolic Panel:  Recent Labs Lab 04/04/13 0443 04/05/13 0620 04/06/13 0530 04/07/13 0414 04/08/13 0410  NA 139 138 139 140 136   K 4.2 4.3 4.7 4.4 4.1  CL 104 103 99 98 93*  CO2 26 25 30 30 31   GLUCOSE 94 102* 164* 156* 214*  BUN 51* 62* 65* 72* 75*  CREATININE 2.55* 2.71* 2.85* 2.93* 3.41*  CALCIUM 9.0 8.9 9.7 10.0 10.1  MG 2.0  --   --   --   --    Liver Function Tests: No results found for this basename: AST, ALT, ALKPHOS, BILITOT, PROT, ALBUMIN,  in the last 168 hours No results found for this basename: LIPASE, AMYLASE,  in the last 168 hours No results found for this basename: AMMONIA,  in the last 168 hours CBC:  Recent Labs Lab 04/02/13 1636 04/02/13 2300 04/03/13 0420 04/04/13 0443  WBC 13.5* 12.7* 12.4* 9.8  HGB 11.2* 11.0* 10.8* 10.6*  HCT 35.0* 33.2* 33.2* 33.2*  MCV 92.6 91.2 91.0 91.7  PLT 380 386 360 392   Cardiac Enzymes:  Recent Labs Lab 04/02/13 2218 04/03/13 0418 04/03/13 1147  TROPONINI <0.30 <0.30 <0.30   BNP (last 3 results)  Recent Labs  04/26/12 1002 04/02/13 1637  PROBNP 196.0* 2612.0*   CBG:  Recent Labs Lab 04/07/13 1103 04/07/13 1632 04/07/13 2058 04/08/13 0548 04/08/13 1053  GLUCAP 272* 211* 155* 207* 288*    No results found for this or any previous visit (  from the past 240 hour(s)).   Studies: No results found.  Scheduled Meds: . allopurinol  100 mg Oral Daily  . amLODipine  10 mg Oral Daily  . aspirin EC  81 mg Oral Daily  . atorvastatin  20 mg Oral Daily  . dicyclomine  10 mg Oral TID AC & HS  . fluticasone  2 spray Each Nare QHS  . heparin  5,000 Units Subcutaneous Q8H  . hydrALAZINE  50 mg Oral TID  . insulin aspart  0-20 Units Subcutaneous TID WC  . insulin aspart  8 Units Subcutaneous TID WC  . insulin detemir  70 Units Subcutaneous q morning - 10a  . isosorbide mononitrate  30 mg Oral Daily  . ketorolac  1 drop Both Eyes TID  . metoprolol tartrate  12.5 mg Oral Daily  . mometasone-formoterol  2 puff Inhalation BID  . pantoprazole  80 mg Oral Q1200  . pregabalin  100 mg Oral QPM   Continuous Infusions:     Marinda Elk  [facility] Pager [phone]. If 8PM-8AM, please contact night-coverage at [email or web address], password [facility] 04/08/2013, 12:57 PM  LOS: 6 days

## 2013-04-08 NOTE — Progress Notes (Signed)
HPI: [name] is a 67 y.o. female with hx of morbid obesity, sleep apnea (noncompliant with CPAP), asthma/COPD, chronic prediosne use for sarcoidosis, CRI (baseline Cr 2.2- chronic diastolic CHF, CAD and DM She has been followed by Dr Kriste Basque for pulmonary and Dr Daleen Squibb for cardiology.  She has had robust response to IV diuresis.   She reports that she feels "much better".  + headache today  Denies SOB.   Echo 04/03/13: EF 60-65% grade 2 DD. Mild AS. RV normal.   Creatinine 2.7>2.8 >2.9>3.4  ROS: All systems negative except as listed in HPI, PMH and Problem List.  Past Medical History  Diagnosis Date  . Unspecified essential hypertension   . CAD (coronary artery disease) 01/04/2006    Tiny OM1 70-90% ostial stenosis, no other CAD  . Asthma   . Esophageal reflux   . Obesity, unspecified   . Anxiety state, unspecified   . Hypercalcemia   . Anemia   . Other psoriasis   . Diverticulosis   . Sarcoidosis 2012  . Dyslipidemia   . COPD (chronic obstructive pulmonary disease)   . Gout   . Colon polyps     Tubular adenomatous polyps  . Spinal stenosis of lumbar region   . Bulging lumbar disc   . CHF (congestive heart failure)   . PNA (pneumonia) 2012    With pleural effusion, requiring thoracentesis  . Pneumonia     "several times" (04/02/2013)  . Pleural effusion 2012  . Chronic bronchitis   . Exertional shortness of breath     "and lying flat" (04/02/2013)  . On home oxygen therapy     "2L when I'm up; 3L when I'm at sleep" (04/02/2013)  . OSA (obstructive sleep apnea)     marginally compliant with CPAP  . Type II diabetes mellitus   . Degenerative joint disease   . Arthritis     'all over" (04/02/2013)  . Chronic lower back pain   . Kidney disease     baseline creatinie 2.0,  folowed by Dr Caryn Section  . Renal failure     "kidney's are working @ 50%" (04/02/2013)  . Diverticulosis   . GERD (gastroesophageal reflux disease)   . Anxiety     Current Facility-Administered Medications   Medication Dose Route Frequency Provider Last Rate Last Dose  . acetaminophen (TYLENOL) tablet 650 mg  650 mg Oral Q6H PRN Jinger Neighbors, NP      . albuterol (PROVENTIL HFA;VENTOLIN HFA) 108 (90 BASE) MCG/ACT inhaler 2 puff  2 puff Inhalation Q6H PRN Houston Siren, MD      . ipratropium (ATROVENT) nebulizer solution 0.5 mg  0.5 mg Nebulization Q6H PRN Houston Siren, MD   0.5 mg at 04/06/13 0343   And  . albuterol (PROVENTIL) (5 MG/ML) 0.5% nebulizer solution 2.5 mg  2.5 mg Nebulization Q6H PRN Houston Siren, MD   2.5 mg at 04/06/13 0343  . allopurinol (ZYLOPRIM) tablet 100 mg  100 mg Oral Daily Houston Siren, MD   100 mg at 04/07/13 1102  . amLODipine (NORVASC) tablet 10 mg  10 mg Oral Daily Houston Siren, MD   10 mg at 04/07/13 1102  . aspirin EC tablet 81 mg  81 mg Oral Daily Houston Siren, MD   81 mg at 04/07/13 1102  . atorvastatin (LIPITOR) tablet 20 mg  20 mg Oral Daily Houston Siren, MD   20 mg at 04/07/13 1101  . benzonatate (TESSALON) capsule 100 mg  100 mg Oral TID PRN  Rolan Lipa, NP   100 mg at 04/06/13 2158  . cyclobenzaprine (FLEXERIL) tablet 5 mg  5 mg Oral TID PRN Leroy Sea, MD   5 mg at 04/03/13 2228  . dicyclomine (BENTYL) capsule 10 mg  10 mg Oral TID AC & HS Houston Siren, MD   10 mg at 04/08/13 0610  . fluticasone (FLONASE) 50 MCG/ACT nasal spray 2 spray  2 spray Each Nare QHS Houston Siren, MD   2 spray at 04/07/13 2223  . guaiFENesin-dextromethorphan (ROBITUSSIN DM) 100-10 MG/5ML syrup 5 mL  5 mL Oral Q4H PRN Amy D Clegg, NP      . heparin injection 5,000 Units  5,000 Units Subcutaneous Q8H Houston Siren, MD   5,000 Units at 04/08/13 0609  . hydrALAZINE (APRESOLINE) tablet 50 mg  50 mg Oral TID Marinda Elk, MD   50 mg at 04/07/13 2222  . HYDROcodone-acetaminophen (NORCO/VICODIN) 5-325 MG per tablet 2 tablet  2 tablet Oral Q4H PRN Houston Siren, MD   1 tablet at 04/07/13 2223  . insulin aspart (novoLOG) injection 0-20 Units  0-20 Units Subcutaneous TID WC Houston Siren, MD   8 Units at 04/08/13 0610  .  insulin aspart (novoLOG) injection 8 Units  8 Units Subcutaneous TID WC Leroy Sea, MD   8 Units at 04/07/13 1303  . insulin detemir (LEVEMIR) injection 70 Units  70 Units Subcutaneous q morning - 10a Houston Siren, MD   70 Units at 04/07/13 1102  . isosorbide mononitrate (IMDUR) 24 hr tablet 30 mg  30 mg Oral Daily Leroy Sea, MD   30 mg at 04/07/13 1102  . ketorolac (ACULAR) 0.5 % ophthalmic solution 1 drop  1 drop Both Eyes TID Houston Siren, MD   1 drop at 04/07/13 2223  . metoprolol tartrate (LOPRESSOR) tablet 12.5 mg  12.5 mg Oral Daily Houston Siren, MD   12.5 mg at 04/07/13 1101  . mometasone-formoterol (DULERA) 100-5 MCG/ACT inhaler 2 puff  2 puff Inhalation BID Houston Siren, MD   2 puff at 04/08/13 0740  . ondansetron (ZOFRAN) injection 4 mg  4 mg Intravenous Q6H PRN Marinda Elk, MD      . ondansetron (ZOFRAN-ODT) disintegrating tablet 4 mg  4 mg Oral Q8H PRN Houston Siren, MD   4 mg at 04/07/13 1124  . pantoprazole (PROTONIX) EC tablet 80 mg  80 mg Oral Q1200 Houston Siren, MD   80 mg at 04/07/13 1200  . pregabalin (LYRICA) capsule 100 mg  100 mg Oral QPM Houston Siren, MD   100 mg at 04/07/13 1935     PHYSICAL EXAM: Filed Vitals:   04/07/13 0846 04/07/13 1400 04/07/13 2103 04/08/13 0514  BP:  124/62 154/48 131/46  Pulse:  78 87 98  Temp:  97.9 F (36.6 C) 99.2 F (37.3 C) 100.3 F (37.9 C)  TempSrc:  Oral Oral Oral  Resp:  20 18 20   Height:      Weight:    214 lb 15.2 oz (97.5 kg)  SpO2: 94% 100% 93% 96%    General:  Well appearing. No resp difficulty HEENT: OP clear Neck: supple. JVP hard to tell due to body habitus. Carotids 2+ bilaterally; no bruits. No lymphadenopathy or thryomegaly appreciated. Cor: PMI normal. Regular rate & rhythm. No rubs, gallops or murmurs. Lungs: clear Abdomen: obese, soft, nontender, nondistended. No hepatosplenomegaly. No bruits or masses. Good bowel sounds. Extremities: no cyanosis, clubbing, rash, R and LLLE trace edema Neuro: alert & orientedx3,  cranial nerves grossly intact. Moves all 4 extremities w/o difficulty. Affect pleasant.   ASSESSMENT & PLAN: 1. A/C diastolic HF  2. A/C renal failure  3. Cardio renal syndrome  4. Morbid obesity  5. DM  6. OSA, non-compliant with CPAP  7. Mild aortic stenosis  8. COPD/asthma  9. 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  I think that we have probably taken too much fluid off.  Hold demadex today and follow creatinine.  Gentle oral hydration today.   Will need renal f/u as outpatient.  She will need to remain in the hospital until her renal function stabilizes.  Consult cardiac rehab and dietitian.    Fayrene Fearing Yailen Zemaitis,MD 7:58 AM

## 2013-04-08 NOTE — Progress Notes (Signed)
Patient resting comfortably, denies any pain or discomfort. Will continue to monitor. Tamani Durney M  

## 2013-04-09 ENCOUNTER — Inpatient Hospital Stay (HOSPITAL_COMMUNITY): Payer: Medicare Other

## 2013-04-09 LAB — BASIC METABOLIC PANEL
BUN: 79 mg/dL — ABNORMAL HIGH (ref 6–23)
CO2: 30 mEq/L (ref 19–32)
Calcium: 9.6 mg/dL (ref 8.4–10.5)
Chloride: 91 mEq/L — ABNORMAL LOW (ref 96–112)
Creatinine, Ser: 4.11 mg/dL — ABNORMAL HIGH (ref 0.50–1.10)
GFR calc Af Amer: 12 mL/min — ABNORMAL LOW (ref >90)
Glucose, Bld: 246 mg/dL — ABNORMAL HIGH (ref 70–99)
Potassium: 4.2 mEq/L (ref 3.5–5.1)
Sodium: 133 mEq/L — ABNORMAL LOW (ref 135–145)
Sodium: 136 mEq/L (ref 135–145)

## 2013-04-09 LAB — GLUCOSE, CAPILLARY

## 2013-04-09 MED ORDER — HYDRALAZINE HCL 10 MG PO TABS
10.0000 mg | ORAL_TABLET | Freq: Three times a day (TID) | ORAL | Status: DC
  Filled 2013-04-09 (×4): qty 1

## 2013-04-09 MED ORDER — ISOSORBIDE MONONITRATE 15 MG HALF TABLET
15.0000 mg | ORAL_TABLET | Freq: Every day | ORAL | Status: DC
  Filled 2013-04-09: qty 1

## 2013-04-09 MED ORDER — SODIUM CHLORIDE 0.9 % IV BOLUS (SEPSIS)
500.0000 mL | Freq: Once | INTRAVENOUS | Status: AC
  Administered 2013-04-09: 500 mL via INTRAVENOUS

## 2013-04-09 NOTE — Progress Notes (Signed)
Pt is alert and oriented x4. Pt has no complaints of pain. Pt is on RA. Pt received a 500 ml bolus x1. Pt is still complaining of dizziness upon standing.

## 2013-04-09 NOTE — Progress Notes (Addendum)
CARDIAC REHAB PHASE I   PRE:  Rate/Rhythm: 79 SR    BP: sitting 150/60, standing 146/70    SaO2: 99 2 1/2 L, 95 RA sitting but 90 RA standing  MODE:  Ambulation: 270 ft   POST:  Rate/Rhythm: 88 SR    BP: sitting 126/70     SaO2: 95 2L  Pt c/o being lightheaded upon standing. BP stable, SaO2 low so therefore walked on O2. Pt tolerated ok, lightheadedness seemed stable. Pt does fatigue easily and legs weak upon return to recliner. Also c/o SOB as well. BP down to 126/70 after walk (2 min of rest). Left pt on RA in recliner, SaO2 maintaining mid 90s. [id number]   [name] CES, ACSM 04/09/2013 1:29 PM

## 2013-04-09 NOTE — Progress Notes (Signed)
Inpatient Diabetes Program Recommendations  AACE/ADA: New Consensus Statement on Inpatient Glycemic Control (2013)  Target Ranges:  Prepandial:   less than 140 mg/dL      Peak postprandial:   less than 180 mg/dL (1-2 hours)      Critically ill patients:  140 - 180 mg/dL  Results for [name], [name] (MRN [id number]) as of 04/09/2013 12:12  Ref. Range 04/08/2013 10:53 04/08/2013 16:50 04/08/2013 21:30 04/09/2013 06:09 04/09/2013 11:03  Glucose-Capillary Latest Range: 70-99 mg/dL 782 (H) 956 (H) 213 (H) 252 (H) 245 (H)    Inpatient Diabetes Program Recommendations Insulin - Meal Coverage: consider increasing Novolog with meals (patient takes more at home)  Thank you  Piedad Climes BSN, RN,CDE Inpatient Diabetes Coordinator [phone] (team pager)

## 2013-04-09 NOTE — Progress Notes (Deleted)
[facility] PROGRESS NOTE  Assessment/Plan:  Acute diastolic congestive heart failure: - Weight on admission: 102.4 kg ->98.1 kg. - She relates at home she is not compliant with fluid or salt restriction. - Hold lasix, Imdur and hydralazine worsening cr. NS bolus,  No episodes of hypotension, will d/w cards. b-met this afternoon. - b-met in am. Check orthostatics.  Acute on chronic RENAL INSUFFICIENCY - Baseline Cr 2.5 -> 3.4-> 4.1 - due to overdiuresis. - oral hydration.  OBESITY - counseling.  OBSTRUCTIVE SLEEP APNEA - cont C-pap at home dose.   DM - Not complaint with diet, most likely the reason her BG went up. - d/cing prednisone.   HYPERTENSION - metoprolol and imdur and increase hydralazine.    Code Status: full Family Communication: none  Disposition Plan: inpatinet   Consultants:  cardiology  Procedures:  echo  Antibiotics:  Azithromycin  HPI/Subjective: Feel at baseline. Mild dizziness upon standing.  Objective: Filed Vitals:   04/08/13 2109 04/08/13 2113 04/09/13 0303 04/09/13 0536  BP: 138/58   117/86  Pulse: 83   105  Temp: 99 F (37.2 C)   98.8 F (37.1 C)  TempSrc: Oral   Oral  Resp: 20   20  Height:      Weight:   98.204 kg (216 lb 8 oz) 98.158 kg (216 lb 6.4 oz)  SpO2: 94% 90%  99%    Intake/Output Summary (Last 24 hours) at 04/09/13 0832 Last data filed at 04/09/13 0829  Gross per 24 hour  Intake    840 ml  Output   1900 ml  Net  -1060 ml   Filed Weights   04/08/13 0514 04/09/13 0303 04/09/13 0536  Weight: 97.5 kg (214 lb 15.2 oz) 98.204 kg (216 lb 8 oz) 98.158 kg (216 lb 6.4 oz)    Exam:  General: Alert, awake, oriented x3, in no acute distress.  HEENT: No bruits, no goiter.  Heart: Regular rate and rhythm, without murmurs, rubs, gallops.  Anasarca. Lungs: Good air movement, clear to auscultation Abdomen: Soft, nontender, nondistended, positive bowel sounds.    Data Reviewed: Basic Metabolic  Panel:  Recent Labs Lab 04/04/13 0443 04/05/13 0620 04/06/13 0530 04/07/13 0414 04/08/13 0410 04/09/13 0605  NA 139 138 139 140 136 133*  K 4.2 4.3 4.7 4.4 4.1 4.3  CL 104 103 99 98 93* 91*  CO2 26 25 30 30 31 30   GLUCOSE 94 102* 164* 156* 214* 233*  BUN 51* 62* 65* 72* 75* 79*  CREATININE 2.55* 2.71* 2.85* 2.93* 3.41* 4.11*  CALCIUM 9.0 8.9 9.7 10.0 10.1 9.6  MG 2.0  --   --   --   --   --    Liver Function Tests: No results found for this basename: AST, ALT, ALKPHOS, BILITOT, PROT, ALBUMIN,  in the last 168 hours No results found for this basename: LIPASE, AMYLASE,  in the last 168 hours No results found for this basename: AMMONIA,  in the last 168 hours CBC:  Recent Labs Lab 04/02/13 1636 04/02/13 2300 04/03/13 0420 04/04/13 0443  WBC 13.5* 12.7* 12.4* 9.8  HGB 11.2* 11.0* 10.8* 10.6*  HCT 35.0* 33.2* 33.2* 33.2*  MCV 92.6 91.2 91.0 91.7  PLT 380 386 360 392   Cardiac Enzymes:  Recent Labs Lab 04/02/13 2218 04/03/13 0418 04/03/13 1147  TROPONINI <0.30 <0.30 <0.30   BNP (last 3 results)  Recent Labs  04/26/12 1002 04/02/13 1637  PROBNP 196.0* 2612.0*   CBG:  Recent Labs Lab 04/08/13  1610 04/08/13 1053 04/08/13 1650 04/08/13 2130 04/09/13 0609  GLUCAP 207* 288* 164* 226* 252*    No results found for this or any previous visit (from the past 240 hour(s)).   Studies: No results found.  Scheduled Meds: . allopurinol  100 mg Oral Daily  . amLODipine  10 mg Oral Daily  . aspirin EC  81 mg Oral Daily  . atorvastatin  20 mg Oral Daily  . dicyclomine  10 mg Oral TID AC & HS  . fluticasone  2 spray Each Nare QHS  . heparin  5,000 Units Subcutaneous Q8H  . hydrALAZINE  50 mg Oral TID  . insulin aspart  0-20 Units Subcutaneous TID WC  . insulin aspart  8 Units Subcutaneous TID WC  . insulin detemir  70 Units Subcutaneous q morning - 10a  . isosorbide mononitrate  30 mg Oral Daily  . ketorolac  1 drop Both Eyes TID  . metoprolol tartrate   12.5 mg Oral Daily  . mometasone-formoterol  2 puff Inhalation BID  . pantoprazole  80 mg Oral Q1200  . pregabalin  100 mg Oral QPM   Continuous Infusions:     Marinda Elk  [facility] Pager [phone]. If 8PM-8AM, please contact night-coverage at [email or web address], password [facility] 04/09/2013, 8:32 AM  LOS: 7 days

## 2013-04-09 NOTE — Progress Notes (Signed)
Patient resting comfortably and denies any pain or discomfort. Will continue to monitor. Troy Sine

## 2013-04-09 NOTE — Progress Notes (Addendum)
Weight Range   Baseline proBNP     HPI: [name] is a 67 y.o. female with hx of morbid obesity, sleep apnea (noncompliant with CPAP), asthma/COPD, chronic prediosne use for sarcoidosis, CRI (baseline Cr 2.2- chronic diastolic CHF, CAD and DM She has been followed by Dr Kriste Basque for pulmonary and Dr Daleen Squibb for cardiology.  Yesterday diuretics held. Given 250 cc NS. Renal function trending up. Weight up 2 pounds.  (Still14 pounds down from admit)  Complaining of dizziness sitting and standing. Denies SOB.   Echo 04/03/13: EF 60-65% grade 2 DD. Mild AS. RV normal.   Creatinine 2.7>2.8>3.4>4.1    ROS: All systems negative except as listed in HPI, PMH and Problem List.  Past Medical History  Diagnosis Date  . Unspecified essential hypertension   . CAD (coronary artery disease) 01/04/2006    Tiny OM1 70-90% ostial stenosis, no other CAD  . Asthma   . Esophageal reflux   . Obesity, unspecified   . Anxiety state, unspecified   . Hypercalcemia   . Anemia   . Other psoriasis   . Diverticulosis   . Sarcoidosis 2012  . Dyslipidemia   . COPD (chronic obstructive pulmonary disease)   . Gout   . Colon polyps     Tubular adenomatous polyps  . Spinal stenosis of lumbar region   . Bulging lumbar disc   . CHF (congestive heart failure)   . PNA (pneumonia) 2012    With pleural effusion, requiring thoracentesis  . Pneumonia     "several times" (04/02/2013)  . Pleural effusion 2012  . Chronic bronchitis   . Exertional shortness of breath     "and lying flat" (04/02/2013)  . On home oxygen therapy     "2L when I'm up; 3L when I'm at sleep" (04/02/2013)  . OSA (obstructive sleep apnea)     marginally compliant with CPAP  . Type II diabetes mellitus   . Degenerative joint disease   . Arthritis     'all over" (04/02/2013)  . Chronic lower back pain   . Kidney disease     baseline creatinie 2.0,  folowed by Dr Caryn Section  . Renal failure     "kidney's are working @ 50%" (04/02/2013)  . Diverticulosis    . GERD (gastroesophageal reflux disease)   . Anxiety     Current Facility-Administered Medications  Medication Dose Route Frequency Provider Last Rate Last Dose  . acetaminophen (TYLENOL) tablet 650 mg  650 mg Oral Q6H PRN Jinger Neighbors, NP   650 mg at 04/09/13 2956  . albuterol (PROVENTIL HFA;VENTOLIN HFA) 108 (90 BASE) MCG/ACT inhaler 2 puff  2 puff Inhalation Q6H PRN Houston Siren, MD      . ipratropium (ATROVENT) nebulizer solution 0.5 mg  0.5 mg Nebulization Q6H PRN Houston Siren, MD   0.5 mg at 04/06/13 0343   And  . albuterol (PROVENTIL) (5 MG/ML) 0.5% nebulizer solution 2.5 mg  2.5 mg Nebulization Q6H PRN Houston Siren, MD   2.5 mg at 04/06/13 0343  . allopurinol (ZYLOPRIM) tablet 100 mg  100 mg Oral Daily Houston Siren, MD   100 mg at 04/08/13 0911  . amLODipine (NORVASC) tablet 10 mg  10 mg Oral Daily Houston Siren, MD   10 mg at 04/08/13 0911  . aspirin EC tablet 81 mg  81 mg Oral Daily Houston Siren, MD   81 mg at 04/08/13 0911  . atorvastatin (LIPITOR) tablet 20 mg  20 mg Oral Daily Houston Siren, MD  20 mg at 04/08/13 0911  . benzonatate (TESSALON) capsule 100 mg  100 mg Oral TID PRN Rolan Lipa, NP   100 mg at 04/06/13 2158  . dicyclomine (BENTYL) capsule 10 mg  10 mg Oral TID AC & HS Houston Siren, MD   10 mg at 04/09/13 0615  . fluticasone (FLONASE) 50 MCG/ACT nasal spray 2 spray  2 spray Each Nare QHS Houston Siren, MD   2 spray at 04/07/13 2223  . guaiFENesin-dextromethorphan (ROBITUSSIN DM) 100-10 MG/5ML syrup 5 mL  5 mL Oral Q4H PRN Amy D Clegg, NP      . heparin injection 5,000 Units  5,000 Units Subcutaneous Q8H Houston Siren, MD   5,000 Units at 04/09/13 0615  . hydrALAZINE (APRESOLINE) tablet 50 mg  50 mg Oral TID Marinda Elk, MD   50 mg at 04/08/13 2307  . HYDROcodone-acetaminophen (NORCO/VICODIN) 5-325 MG per tablet 2 tablet  2 tablet Oral Q4H PRN Houston Siren, MD   1 tablet at 04/07/13 2223  . insulin aspart (novoLOG) injection 0-20 Units  0-20 Units Subcutaneous TID WC Houston Siren, MD   11 Units at  04/09/13 0615  . insulin aspart (novoLOG) injection 8 Units  8 Units Subcutaneous TID WC Leroy Sea, MD   8 Units at 04/09/13 0741  . insulin detemir (LEVEMIR) injection 70 Units  70 Units Subcutaneous q morning - 10a Houston Siren, MD   70 Units at 04/08/13 0912  . isosorbide mononitrate (IMDUR) 24 hr tablet 30 mg  30 mg Oral Daily Leroy Sea, MD   30 mg at 04/08/13 0911  . ketorolac (ACULAR) 0.5 % ophthalmic solution 1 drop  1 drop Both Eyes TID Houston Siren, MD   1 drop at 04/08/13 2310  . metoprolol tartrate (LOPRESSOR) tablet 12.5 mg  12.5 mg Oral Daily Houston Siren, MD   12.5 mg at 04/08/13 0911  . mometasone-formoterol (DULERA) 100-5 MCG/ACT inhaler 2 puff  2 puff Inhalation BID Houston Siren, MD   2 puff at 04/08/13 2113  . ondansetron (ZOFRAN) injection 4 mg  4 mg Intravenous Q6H PRN Marinda Elk, MD      . ondansetron (ZOFRAN-ODT) disintegrating tablet 4 mg  4 mg Oral Q8H PRN Houston Siren, MD   4 mg at 04/07/13 1124  . pantoprazole (PROTONIX) EC tablet 80 mg  80 mg Oral Q1200 Houston Siren, MD   80 mg at 04/08/13 0911  . pregabalin (LYRICA) capsule 100 mg  100 mg Oral QPM Houston Siren, MD   100 mg at 04/08/13 1654     PHYSICAL EXAM: Filed Vitals:   04/08/13 2109 04/08/13 2113 04/09/13 0303 04/09/13 0536  BP: 138/58   117/86  Pulse: 83   105  Temp: 99 F (37.2 C)   98.8 F (37.1 C)  TempSrc: Oral   Oral  Resp: 20   20  Height:      Weight:   216 lb 8 oz (98.204 kg) 216 lb 6.4 oz (98.158 kg)  SpO2: 94% 90%  99%    General:  Weak appearing. No resp difficulty Sitting in chair.  HEENT: normal Neck: supple. JVP hard to tell due to body habitus. Carotids 2+ bilaterally; no bruits. No lymphadenopathy or thryomegaly appreciated. Cor: PMI normal. Regular rate & rhythm. No rubs, gallops or murmurs. Lungs: clear Abdomen: obese, soft, nontender, nondistended. No hepatosplenomegaly. No bruits or masses. Good bowel sounds. Extremities: no cyanosis, clubbing, rash, R and LLE trace edema Neuro:  alert & orientedx3,  cranial nerves grossly intact. Moves all 4 extremities w/o difficulty. Affect pleasant.   ASSESSMENT & PLAN: 1. A/C diastolic HF ECHO 08/07/08 EF 60-65% 2. A/C renal failure  3. Cardio renal syndrome  4. Morbid obesity  5. DM  6. OSA, non-compliant with CPAP  7. Mild aortic stenosis  8. COPD/asthma  9. Acute on chronic respiratory failure  10. S/p splenectomy 2012   Volume status ok off diuretics. Complaining of dizziness. Check orthostatics. Renal function trending up. 3.4>4.11. Continue ted hose.   Tonye Becket, NP-C  Patient seen and examined with Tonye Becket, NP. We discussed all aspects of the encounter. I agree with the assessment and plan as stated above.   Renal function worse but still making urine. I suspect she has cardiorenal syndrome with very narrow euvolemic window. We have overdiuresed her. Agree with holding diuretics. Would not give any more IVF, if possible. Suspect renal function will recover slowly and we do not want to put back all the fluid we have taken off. Will get renal u/s today. Will need to f/u with Renal as outpatient (previously followed by Dr. Caryn Section).  Will check SPEP/UPEP to exclude myeloma. (she has h/o MGUS). Doubt renal artery stenosis as she has excellent distal pulses.   Briann Sarchet,MD 10:51 AM

## 2013-04-09 NOTE — Progress Notes (Signed)
[facility] PROGRESS NOTE  Assessment/Plan:  Acute diastolic congestive heart failure: - Weight on admission: 102.4 kg ->98.1 kg. - She relates at home she is not compliant with fluid or salt restriction. - Hold lasix, Imdur and hydralazine worsening cr. NS bolus,  No episodes of hypotension, will d/w cards. b-met this afternoon. - b-met in am. Check orthostatics.  Acute on chronic RENAL INSUFFICIENCY - Baseline Cr 2.5 -> 3.4-> 4.1 - due to overdiuresis. - oral hydration. - Appointment with renal made.  OBESITY - counseling.  OBSTRUCTIVE SLEEP APNEA - cont C-pap at home dose.   DM - Not complaint with diet, most likely the reason her BG went up. - d/cing prednisone.   HYPERTENSION - metoprolol and imdur and increase hydralazine.    Code Status: full Family Communication: none  Disposition Plan: inpatinet   Consultants:  cardiology  Procedures:  echo  Antibiotics:  Azithromycin  HPI/Subjective: Feel at baseline. Mild dizziness upon standing.  Objective: Filed Vitals:   04/09/13 0936 04/09/13 0938 04/09/13 0941 04/09/13 1344  BP: 130/55 129/55 145/56 133/46  Pulse: 87 95 95 76  Temp:    97.9 F (36.6 C)  TempSrc:    Oral  Resp:    20  Height:      Weight:      SpO2:    95%    Intake/Output Summary (Last 24 hours) at 04/09/13 1359 Last data filed at 04/09/13 1320  Gross per 24 hour  Intake    840 ml  Output   1400 ml  Net   -560 ml   Filed Weights   04/08/13 0514 04/09/13 0303 04/09/13 0536  Weight: 97.5 kg (214 lb 15.2 oz) 98.204 kg (216 lb 8 oz) 98.158 kg (216 lb 6.4 oz)    Exam:  General: Alert, awake, oriented x3, in no acute distress.  HEENT: No bruits, no goiter.  Heart: Regular rate and rhythm, without murmurs, rubs, gallops.  Anasarca. Lungs: Good air movement, clear to auscultation Abdomen: Soft, nontender, nondistended, positive bowel sounds.    Data Reviewed: Basic Metabolic Panel:  Recent Labs Lab 04/04/13 0443  04/05/13 0620 04/06/13 0530 04/07/13 0414 04/08/13 0410 04/09/13 0605  NA 139 138 139 140 136 133*  K 4.2 4.3 4.7 4.4 4.1 4.3  CL 104 103 99 98 93* 91*  CO2 26 25 30 30 31 30   GLUCOSE 94 102* 164* 156* 214* 233*  BUN 51* 62* 65* 72* 75* 79*  CREATININE 2.55* 2.71* 2.85* 2.93* 3.41* 4.11*  CALCIUM 9.0 8.9 9.7 10.0 10.1 9.6  MG 2.0  --   --   --   --   --    Liver Function Tests: No results found for this basename: AST, ALT, ALKPHOS, BILITOT, PROT, ALBUMIN,  in the last 168 hours No results found for this basename: LIPASE, AMYLASE,  in the last 168 hours No results found for this basename: AMMONIA,  in the last 168 hours CBC:  Recent Labs Lab 04/02/13 1636 04/02/13 2300 04/03/13 0420 04/04/13 0443  WBC 13.5* 12.7* 12.4* 9.8  HGB 11.2* 11.0* 10.8* 10.6*  HCT 35.0* 33.2* 33.2* 33.2*  MCV 92.6 91.2 91.0 91.7  PLT 380 386 360 392   Cardiac Enzymes:  Recent Labs Lab 04/02/13 2218 04/03/13 0418 04/03/13 1147  TROPONINI <0.30 <0.30 <0.30   BNP (last 3 results)  Recent Labs  04/26/12 1002 04/02/13 1637  PROBNP 196.0* 2612.0*   CBG:  Recent Labs Lab 04/08/13 1053 04/08/13 1650 04/08/13 2130 04/09/13 4540  04/09/13 1103  GLUCAP 288* 164* 226* 252* 245*    No results found for this or any previous visit (from the past 240 hour(s)).   Studies: No results found.  Scheduled Meds: . allopurinol  100 mg Oral Daily  . aspirin EC  81 mg Oral Daily  . atorvastatin  20 mg Oral Daily  . dicyclomine  10 mg Oral TID AC & HS  . fluticasone  2 spray Each Nare QHS  . heparin  5,000 Units Subcutaneous Q8H  . insulin aspart  0-20 Units Subcutaneous TID WC  . insulin aspart  8 Units Subcutaneous TID WC  . insulin detemir  70 Units Subcutaneous q morning - 10a  . ketorolac  1 drop Both Eyes TID  . metoprolol tartrate  12.5 mg Oral Daily  . mometasone-formoterol  2 puff Inhalation BID  . pantoprazole  80 mg Oral Q1200  . pregabalin  100 mg Oral QPM   Continuous  Infusions:     Marinda Elk  [facility] Pager [phone]. If 8PM-8AM, please contact night-coverage at [email or web address], password [facility] 04/09/2013, 1:59 PM  LOS: 7 days

## 2013-04-10 ENCOUNTER — Ambulatory Visit: Payer: Medicare Other | Admitting: Internal Medicine

## 2013-04-10 ENCOUNTER — Telehealth: Payer: Self-pay | Admitting: Pulmonary Disease

## 2013-04-10 ENCOUNTER — Telehealth: Payer: Self-pay | Admitting: Internal Medicine

## 2013-04-10 DIAGNOSIS — I1310 Hypertensive heart and chronic kidney disease without heart failure, with stage 1 through stage 4 chronic kidney disease, or unspecified chronic kidney disease: Secondary | ICD-10-CM

## 2013-04-10 LAB — GLUCOSE, CAPILLARY: Glucose-Capillary: 212 mg/dL — ABNORMAL HIGH (ref 70–99)

## 2013-04-10 LAB — PRO B NATRIURETIC PEPTIDE: Pro B Natriuretic peptide (BNP): 1436 pg/mL — ABNORMAL HIGH (ref 0–125)

## 2013-04-10 LAB — BASIC METABOLIC PANEL
BUN: 77 mg/dL — ABNORMAL HIGH (ref 6–23)
CO2: 29 mEq/L (ref 19–32)
Chloride: 97 mEq/L (ref 96–112)
Creatinine, Ser: 3.39 mg/dL — ABNORMAL HIGH (ref 0.50–1.10)
Glucose, Bld: 253 mg/dL — ABNORMAL HIGH (ref 70–99)
Potassium: 4.5 mEq/L (ref 3.5–5.1)

## 2013-04-10 MED ORDER — INSULIN DETEMIR 100 UNIT/ML ~~LOC~~ SOLN: 40.0000 [IU] | Freq: Two times a day (BID) | SUBCUTANEOUS | Status: DC

## 2013-04-10 MED ORDER — INSULIN DETEMIR 100 UNIT/ML ~~LOC~~ SOLN
40.0000 [IU] | Freq: Two times a day (BID) | SUBCUTANEOUS | Status: DC
  Administered 2013-04-10: 40 [IU] via SUBCUTANEOUS
  Filled 2013-04-10 (×2): qty 0.4

## 2013-04-10 MED ORDER — TORSEMIDE 20 MG PO TABS: 40.0000 mg | ORAL_TABLET | Freq: Every day | ORAL | Status: DC

## 2013-04-10 MED ORDER — ISOSORB DINITRATE-HYDRALAZINE 20-37.5 MG PO TABS: 1.0000 | ORAL_TABLET | Freq: Three times a day (TID) | ORAL | Status: DC

## 2013-04-10 NOTE — Discharge Summary (Addendum)
Physician Discharge Summary  [name] GEX:[id number] DOB: 1946/05/11 DOA: 04/02/2013  PCP: Michele Mcalpine, MD  Admit date: 04/02/2013 Discharge date: 04/10/2013  Time spent: 35 minutes  Recommendations for Outpatient Follow-up:  1. Follow up with Dr. Daleen Squibb next week, will check a b-met at this time. 2. Will need to titrate medications as tolerate it. 3. Will need a follow up appointment with Heart failure clinic in 2 week. 4. Follow up appointment [facility] in 3 week follow up on SPEP and UPEP.  Discharge Diagnoses:  Active Problems:   Acute on chronic diastolic heart failure   Cardiorenal syndrome   DM   OBESITY   OBSTRUCTIVE SLEEP APNEA   HYPERTENSION   RENAL INSUFFICIENCY   Sarcoidosis   Chronic use of steroids   Discharge Condition: stable  Diet recommendation: low sodium diet  Filed Weights   04/09/13 0303 04/09/13 0536 04/10/13 0450  Weight: 98.204 kg (216 lb 8 oz) 98.158 kg (216 lb 6.4 oz) 98.839 kg (217 lb 14.4 oz)    History of present illness:  67 y.o. female with hx of sleep apnea (noncompliant with CPAP), morbid obesity, asthma and COPD, chronic steroid use for sarcoidosis, chronic diastolic CHF, CAD, DM, GERD, hyperlipidemia, HTN, followed by Dr Kriste Basque for pulmonary and Dr Daleen Squibb for cardiology, presents to the ER with at least four days of increase DOE, orthopnea, and bilateral pedal edema. She also has increase wheezing and has clear sputum productive coughs. Evalaution in the ER included a BNP of 2600, CXR with vascular congestion, and WBC of 13.5K. Her EKG showed bradycardia, with QTc of , but no acute ischemic changes. She has CKD with current Cr of 2.2. She has been on home oxygen and has been on steroids for years. She was given IV Lasix, and hospitalist was asked to admit her for CHF. She denied fever, chills, abdominal pain, nausea, vomiting or diaphoresis.   Hospital Course:  Acute diastolic congestive heart failure:  - Weight on admission:  102.4 kg ->98.8 kg on 9.9.2014 ( back on 3.20.2014 was 89.3 kg). - She relates at home she is not compliant with fluid or salt restriction.  - Echo 04/03/13: EF 60-65% grade 2 DD - Diureses aggressively with lasix Cr. Started to increase. Heart failure team consulted and agreed with diuresis the pt cont to diureses further with mild improvement in Cr. Mild increase in creatinine, lasix, Imdur and hydralazine held due to worsening cr. -  NS bolus, No episodes of hypotension.  - follow up b-met showed and improvement in creatinine - she was d/c on 40 of Demadex daily starting on 9.10.2014.  Acute on chronic RENAL INSUFFICIENCY due to cardiorenal syndrome: - Baseline Cr 2.5 -> 3.4-> 4.1->3.3 - due to overdiuresis.  - oral hydration.  - Appointment with renal made.   OBESITY  - counseling.   OBSTRUCTIVE SLEEP APNEA  - cont C-pap at home dose.   DM  - Not complaint with diet, most likely the reason her BG went up.  - increase her lantus to 40 BID. - will need a HbgA1c.  HYPERTENSION  - metoprolol and imdur and increase hydralazine.    Procedures:  Echo  Consultations:  cardiology  Discharge Exam: Filed Vitals:   04/10/13 0450  BP: 146/68  Pulse: 91  Temp: 97.8 F (36.6 C)  Resp: 20    General: A&O x3 Cardiovascular: RRR Respiratory: good air movement CTA B/l  Discharge Instructions      Discharge Orders   Future Appointments  Provider Department Dept Phone   05/01/2013 2:40 PM [facility] [phone]   05/29/2013 9:30 AM Michele Mcalpine, MD [facility] [phone]   Future Orders Complete By Expires   Diet - low sodium heart healthy  As directed    Increase activity slowly  As directed        Medication List    STOP taking these medications       amLODipine 10 MG tablet  Commonly known as:  NORVASC     cloNIDine 0.1 MG tablet  Commonly known as:  CATAPRES     diphenoxylate-atropine  2.5-0.025 MG per tablet  Commonly known as:  LOMOTIL     furosemide 40 MG tablet  Commonly known as:  LASIX     glucose blood test strip     guaiFENesin 600 MG 12 hr tablet  Commonly known as:  MUCINEX     hydrALAZINE 10 MG tablet  Commonly known as:  APRESOLINE     ondansetron 4 MG disintegrating tablet  Commonly known as:  ZOFRAN ODT     TUSSIN DM 10-100 MG/5ML liquid  Generic drug:  Dextromethorphan-Guaifenesin      TAKE these medications       albuterol 108 (90 BASE) MCG/ACT inhaler  Commonly known as:  PROVENTIL HFA;VENTOLIN HFA  Inhale 2 puffs into the lungs every 6 (six) hours as needed for wheezing or shortness of breath.     allopurinol 100 MG tablet  Commonly known as:  ZYLOPRIM  Take 100 mg by mouth daily.     APIDRA SOLOSTAR 100 UNIT/ML injection  Generic drug:  insulin glulisine  Inject 18-30 Units into the skin 3 (three) times daily before meals. Use 18  units before breakfast and 30 units before lunch and 26  units before dinner     aspirin EC 81 MG tablet  Take 81 mg by mouth daily.     atorvastatin 20 MG tablet  Commonly known as:  LIPITOR  Take 20 mg by mouth daily.     dicyclomine 10 MG capsule  Commonly known as:  BENTYL  Take 10 mg by mouth 4 (four) times daily -  before meals and at bedtime.     esomeprazole 40 MG capsule  Commonly known as:  NEXIUM  Take 1 capsule (40 mg total) by mouth 2 (two) times daily.     ferrous sulfate 325 (65 FE) MG tablet  Take 325 mg by mouth daily with breakfast.     fluticasone 50 MCG/ACT nasal spray  Commonly known as:  FLONASE  Place 2 sprays into the nose at bedtime.     Fluticasone-Salmeterol 250-50 MCG/DOSE Aepb  Commonly known as:  ADVAIR  Inhale 1 puff into the lungs 2 (two) times daily.     HYDROcodone-acetaminophen 5-325 MG per tablet  Commonly known as:  NORCO/VICODIN  Take 2 tablets by mouth every 4 (four) hours as needed for pain.     insulin detemir 100 UNIT/ML injection  Commonly known  as:  LEVEMIR  Inject 0.4 mLs (40 Units total) into the skin 2 (two) times daily.     isosorbide-hydrALAZINE 20-37.5 MG per tablet  Commonly known as:  BIDIL  Take 1 tablet by mouth 3 (three) times daily.     ketorolac 0.4 % Soln  Commonly known as:  ACULAR LS  Place 1 drop into both eyes 3 (three) times daily.     Lancets Misc  1 Units  by Does not apply route 4 (four) times daily - after meals and at bedtime.     LORazepam 1 MG tablet  Commonly known as:  ATIVAN  Take 1 mg by mouth daily as needed.     metoprolol tartrate 25 MG tablet  Commonly known as:  LOPRESSOR  Take 0.5 tablets (12.5 mg total) by mouth daily.     pregabalin 100 MG capsule  Commonly known as:  LYRICA  Take 1 capsule (100 mg total) by mouth every evening.     sennosides-docusate sodium 8.6-50 MG tablet  Commonly known as:  SENOKOT-S  Take 1 tablet by mouth at bedtime.     torsemide 20 MG tablet  Commonly known as:  DEMADEX  Take 2 tablets (40 mg total) by mouth daily.       Allergies  Allergen Reactions  . Adhesive [Tape] Other (See Comments)    Blisters   . Avelox [Moxifloxacin Hcl In Nacl]     GI upset  . Codeine Other (See Comments)    "crazy"   . Guaifenesin Nausea And Vomiting  . Latex Swelling  . Oxycodone Nausea And Vomiting   Follow-up Information   Follow up with Zada Girt, MD On 04/10/2013. (2 weeks with a b-met.)    Specialty:  Nephrology   Contact information:   [address] [facility] [place] [address] [phone]       Follow up with Arvilla Meres, MD On 05/01/2013. (at 2:40)    Specialty:  Cardiology   Contact information:   [address] [place] [address] [phone]        The results of significant diagnostics from this hospitalization (including imaging, microbiology, ancillary and laboratory) are listed below for reference.    Significant Diagnostic Studies: Dg Chest 2 View  04/06/2013   *RADIOLOGY REPORT*   Clinical Data: Pulmonary edema  CHEST - 2 VIEW  Comparison: Prior chest x-ray 04/04/2013  Findings: Stable cardiac and mediastinal contours.  Stable mild pulmonary vascular congestion without overt edema.  Left basilar subsegmental atelectasis is unchanged.  No pneumothorax or pleural effusion.  Central and lower lobe bronchial wall thickening. Surgical clips noted in the right upper quadrant and left upper quadrant.  IMPRESSION:  1.  No significant interval change compared to 04/04/2013. 2.  Persistent central and bilateral lower lobe bronchitic changes, mild pulmonary vascular congestion and left basilar atelectasis versus infiltrate.   Original Report Authenticated By: Malachy Moan, M.D.   Dg Chest 2 View  04/04/2013   CLINICAL DATA:  One CHF, all.  EXAM: CHEST  2 VIEW  COMPARISON:  04/02/2013  FINDINGS: Mild cardiomegaly with vascular congestion. No focal opacities, effusions or overt edema. No acute bony abnormality.  IMPRESSION: Cardiomegaly, vascular congestion.   Electronically Signed   By: Charlett Nose   On: 04/04/2013 08:38   Dg Chest 2 View  04/02/2013   *RADIOLOGY REPORT*  Clinical Data: Shortness of breath  CHEST - 2 VIEW  Comparison: 10/17/2012  Findings: Cardiomegaly and pulmonary vascular congestion noted. Mild elevation of the right hemidiaphragm is again noted. There is no evidence of focal airspace disease, pulmonary edema, suspicious pulmonary nodule/mass, pleural effusion, or pneumothorax. No acute bony abnormalities are identified.  IMPRESSION: Cardiomegaly with pulmonary vascular congestion.   Original Report Authenticated By: Harmon Pier, M.D.   US Renal  04/09/2013   *RADIOLOGY REPORT*  Clinical Data: Renal insufficiency  RENAL/URINARY TRACT ULTRASOUND COMPLETE  Comparison:  07/21/2012  Findings:  Right Kidney:  9.2 cm in length.  Normal cortical echogenicity.  No hydronephrosis or mass.  1.8 cm simple cyst in the lower pole.  Left Kidney:  9.8 cm in length.  Normal cortical  echogenicity.  No hydronephrosis or mass.  Bladder:  Within normal limits.  IMPRESSION: No hydronephrosis.  No acute urinary pathology.   Original Report Authenticated By: Jolaine Click, M.D.    Microbiology: No results found for this or any previous visit (from the past 240 hour(s)).   Labs: Basic Metabolic Panel:  Recent Labs Lab 04/04/13 0443  04/07/13 0414 04/08/13 0410 04/09/13 0605 04/09/13 1508 04/10/13 0535  NA 139  < > 140 136 133* 136 137  K 4.2  < > 4.4 4.1 4.3 4.2 4.5  CL 104  < > 98 93* 91* 95* 97  CO2 26  < > 30 31 30 30 29   GLUCOSE 94  < > 156* 214* 233* 246* 253*  BUN 51*  < > 72* 75* 79* 79* 77*  CREATININE 2.55*  < > 2.93* 3.41* 4.11* 3.86* 3.39*  CALCIUM 9.0  < > 10.0 10.1 9.6 9.6 10.0  MG 2.0  --   --   --   --   --   --   < > = values in this interval not displayed. Liver Function Tests: No results found for this basename: AST, ALT, ALKPHOS, BILITOT, PROT, ALBUMIN,  in the last 168 hours No results found for this basename: LIPASE, AMYLASE,  in the last 168 hours No results found for this basename: AMMONIA,  in the last 168 hours CBC:  Recent Labs Lab 04/04/13 0443  WBC 9.8  HGB 10.6*  HCT 33.2*  MCV 91.7  PLT 392   Cardiac Enzymes:  Recent Labs Lab 04/03/13 1147  TROPONINI <0.30   BNP: BNP (last 3 results)  Recent Labs  04/26/12 1002 04/02/13 1637  PROBNP 196.0* 2612.0*   CBG:  Recent Labs Lab 04/09/13 0609 04/09/13 1103 04/09/13 1636 04/09/13 2054 04/10/13 0553  GLUCAP 252* 245* 255* 215* 241*       Signed:  Marinda Elk  [facility] 04/10/2013, 10:47 AM

## 2013-04-10 NOTE — Progress Notes (Signed)
Weight Range   Baseline proBNP     HPI: [name] is a 67 y.o. female with hx of morbid obesity, sleep apnea (noncompliant with CPAP), asthma/COPD, chronic prediosne use for sarcoidosis, CRI (baseline Cr 2.2- chronic diastolic CHF, CAD and DM She has been followed by Dr Kriste Basque for pulmonary and Dr Daleen Squibb for cardiology.  Echo 04/03/13: EF 60-65% grade 2 DD. Mild AS. RV normal.   Feels good. Diuretics on hold due to ARF. Renal function improving. Denies dyspnea. Weight up 1 pounds but still down 13 pounds total.  Renal u/s ok.   Creatinine 2.7>2.8>3.4>4.1>3.8>3.3    ROS: All systems negative except as listed in HPI, PMH and Problem List.  Past Medical History  Diagnosis Date  . Unspecified essential hypertension   . CAD (coronary artery disease) 01/04/2006    Tiny OM1 70-90% ostial stenosis, no other CAD  . Asthma   . Esophageal reflux   . Obesity, unspecified   . Anxiety state, unspecified   . Hypercalcemia   . Anemia   . Other psoriasis   . Diverticulosis   . Sarcoidosis 2012  . Dyslipidemia   . COPD (chronic obstructive pulmonary disease)   . Gout   . Colon polyps     Tubular adenomatous polyps  . Spinal stenosis of lumbar region   . Bulging lumbar disc   . CHF (congestive heart failure)   . PNA (pneumonia) 2012    With pleural effusion, requiring thoracentesis  . Pneumonia     "several times" (04/02/2013)  . Pleural effusion 2012  . Chronic bronchitis   . Exertional shortness of breath     "and lying flat" (04/02/2013)  . On home oxygen therapy     "2L when I'm up; 3L when I'm at sleep" (04/02/2013)  . OSA (obstructive sleep apnea)     marginally compliant with CPAP  . Type II diabetes mellitus   . Degenerative joint disease   . Arthritis     'all over" (04/02/2013)  . Chronic lower back pain   . Kidney disease     baseline creatinie 2.0,  folowed by Dr Caryn Section  . Renal failure     "kidney's are working @ 50%" (04/02/2013)  . Diverticulosis   . GERD (gastroesophageal  reflux disease)   . Anxiety     Current Facility-Administered Medications  Medication Dose Route Frequency Provider Last Rate Last Dose  . acetaminophen (TYLENOL) tablet 650 mg  650 mg Oral Q6H PRN Jinger Neighbors, NP   650 mg at 04/09/13 1921  . albuterol (PROVENTIL HFA;VENTOLIN HFA) 108 (90 BASE) MCG/ACT inhaler 2 puff  2 puff Inhalation Q6H PRN Houston Siren, MD      . ipratropium (ATROVENT) nebulizer solution 0.5 mg  0.5 mg Nebulization Q6H PRN Houston Siren, MD   0.5 mg at 04/06/13 0343   And  . albuterol (PROVENTIL) (5 MG/ML) 0.5% nebulizer solution 2.5 mg  2.5 mg Nebulization Q6H PRN Houston Siren, MD   2.5 mg at 04/06/13 0343  . allopurinol (ZYLOPRIM) tablet 100 mg  100 mg Oral Daily Houston Siren, MD   100 mg at 04/09/13 1115  . aspirin EC tablet 81 mg  81 mg Oral Daily Houston Siren, MD   81 mg at 04/09/13 1115  . atorvastatin (LIPITOR) tablet 20 mg  20 mg Oral Daily Houston Siren, MD   20 mg at 04/09/13 1115  . benzonatate (TESSALON) capsule 100 mg  100 mg Oral TID PRN Rolan Lipa, NP  100 mg at 04/06/13 2158  . dicyclomine (BENTYL) capsule 10 mg  10 mg Oral TID AC & HS Houston Siren, MD   10 mg at 04/10/13 0616  . fluticasone (FLONASE) 50 MCG/ACT nasal spray 2 spray  2 spray Each Nare QHS Houston Siren, MD   2 spray at 04/09/13 2136  . guaiFENesin-dextromethorphan (ROBITUSSIN DM) 100-10 MG/5ML syrup 5 mL  5 mL Oral Q4H PRN Amy D Clegg, NP      . heparin injection 5,000 Units  5,000 Units Subcutaneous Q8H Houston Siren, MD   5,000 Units at 04/10/13 [phone]  . HYDROcodone-acetaminophen (NORCO/VICODIN) 5-325 MG per tablet 2 tablet  2 tablet Oral Q4H PRN Houston Siren, MD   1 tablet at 04/07/13 2223  . insulin aspart (novoLOG) injection 0-20 Units  0-20 Units Subcutaneous TID WC Houston Siren, MD   7 Units at 04/10/13 [phone]  . insulin aspart (novoLOG) injection 8 Units  8 Units Subcutaneous TID WC Leroy Sea, MD   8 Units at 04/09/13 1740  . insulin detemir (LEVEMIR) injection 40 Units  40 Units Subcutaneous BID Marinda Elk, MD      . ketorolac (ACULAR) 0.5 % ophthalmic solution 1 drop  1 drop Both Eyes TID Houston Siren, MD   1 drop at 04/09/13 2137  . metoprolol tartrate (LOPRESSOR) tablet 12.5 mg  12.5 mg Oral Daily Houston Siren, MD   12.5 mg at 04/09/13 1114  . mometasone-formoterol (DULERA) 100-5 MCG/ACT inhaler 2 puff  2 puff Inhalation BID Houston Siren, MD   2 puff at 04/09/13 2012  . ondansetron (ZOFRAN) injection 4 mg  4 mg Intravenous Q6H PRN Marinda Elk, MD      . ondansetron (ZOFRAN-ODT) disintegrating tablet 4 mg  4 mg Oral Q8H PRN Houston Siren, MD   4 mg at 04/07/13 1124  . pantoprazole (PROTONIX) EC tablet 80 mg  80 mg Oral Q1200 Houston Siren, MD   80 mg at 04/09/13 1155  . pregabalin (LYRICA) capsule 100 mg  100 mg Oral QPM Houston Siren, MD   100 mg at 04/09/13 1741     PHYSICAL EXAM: Filed Vitals:   04/09/13 1344 04/09/13 2012 04/09/13 2118 04/10/13 0450  BP: 133/46  146/45 146/68  Pulse: 76 87 86 91  Temp: 97.9 F (36.6 C)  98.6 F (37 C) 97.8 F (36.6 C)  TempSrc: Oral  Oral Oral  Resp: 20 20 18 20   Height:      Weight:    98.839 kg (217 lb 14.4 oz)  SpO2: 95% 96% 98% 94%    General:   No resp difficulty Sitting in chair.  HEENT: normal Neck: supple. JVP hard to tell due to body habitus. Carotids 2+ bilaterally; no bruits. No lymphadenopathy or thryomegaly appreciated. Cor: PMI normal. Regular rate & rhythm. No rubs, gallops or murmurs. Lungs: clear Abdomen: obese, soft, nontender, nondistended. No hepatosplenomegaly. No bruits or masses. Good bowel sounds. Extremities: no cyanosis, clubbing, rash, R and LLE trace edema Neuro: alert & orientedx3, cranial nerves grossly intact. Moves all 4 extremities w/o difficulty. Affect pleasant.   ASSESSMENT & PLAN: 1. A/C diastolic HF ECHO 11/01/30 EF 60-65% 2. A/C renal failure  3. Cardio renal syndrome  4. Morbid obesity  5. DM  6. OSA, non-compliant with CPAP  7. Mild aortic stenosis  8. COPD/asthma  9. Acute on chronic respiratory failure   10. S/p splenectomy 2012   Renal function improving. I think she can go home today with close  outpatient f/u. Would switch lasix 20 to torsemide 40 daily starting tomorrow.    Will need to see Renal soon as I suspect she will need HD at some point. Can f/u with Dr. Daleen Squibb at [facility] next Wednesday (we can help arrange) with BMET. Then we will see her back in HF Clinic in 3 weeks.   Andreya Lacks,MD 8:06 AM

## 2013-04-10 NOTE — Progress Notes (Signed)
IV d/c'd.  Tele d/c'd.  Pt d/c'd to home.  Home meds and d/c instructions have been reviewed with pt and pt sister.  Both deny any questions or concerns at this time.  Pt leaving unit via wheelchair and appears in no acute distress. Nino Glow RN

## 2013-04-10 NOTE — Telephone Encounter (Signed)
Qualifying sats faxed to the number provided

## 2013-04-11 LAB — PROTEIN ELECTROPHORESIS, SERUM
Alpha-2-Globulin: 17.9 % — ABNORMAL HIGH (ref 7.1–11.8)
Beta 2: 6.3 % (ref 3.2–6.5)
Beta Globulin: 5.7 % (ref 4.7–7.2)
Gamma Globulin: 14.7 % (ref 11.1–18.8)
M-Spike, %: 0.37 g/dL

## 2013-04-13 ENCOUNTER — Telehealth: Payer: Self-pay | Admitting: Pulmonary Disease

## 2013-04-13 NOTE — Telephone Encounter (Signed)
Spoke with the pt She was recently d/c'ed from hosp on 04/10/13 and was taken off of her pred She states that they did not tell her why to stop med, and now she is having increased joint pain Per last ov note with SN she is supposed to be taking pred 10 mg alternating with 5 mg  TP, please advise on SN's absence thanks !

## 2013-04-13 NOTE — Telephone Encounter (Signed)
Called and spoke with pt and she is aware of TP recs to not restart the prednisone.  Pt voiced her understanding and appt has been scheduled for her to see SN on Monday.

## 2013-04-13 NOTE — Telephone Encounter (Signed)
No return to prednisone as Dr. Kriste Basque  Recommended

## 2013-04-13 NOTE — Telephone Encounter (Signed)
Spoke with [name] She states needing ov notes stating that the pt uses o2 to to medicare compliant  I see where SN had mentioned this on last ov note from June and so faxed this to her and nothing further needed

## 2013-04-16 ENCOUNTER — Encounter: Payer: Self-pay | Admitting: Pulmonary Disease

## 2013-04-16 ENCOUNTER — Telehealth: Payer: Self-pay | Admitting: Cardiology

## 2013-04-16 ENCOUNTER — Ambulatory Visit (INDEPENDENT_AMBULATORY_CARE_PROVIDER_SITE_OTHER): Payer: Medicare Other | Admitting: Pulmonary Disease

## 2013-04-16 ENCOUNTER — Telehealth: Payer: Self-pay | Admitting: Pulmonary Disease

## 2013-04-16 VITALS — BP 128/70 | HR 81 | Temp 98.1°F | Ht 63.0 in | Wt 215.6 lb

## 2013-04-16 DIAGNOSIS — N259 Disorder resulting from impaired renal tubular function, unspecified: Secondary | ICD-10-CM

## 2013-04-16 DIAGNOSIS — E669 Obesity, unspecified: Secondary | ICD-10-CM

## 2013-04-16 DIAGNOSIS — I5033 Acute on chronic diastolic (congestive) heart failure: Secondary | ICD-10-CM

## 2013-04-16 DIAGNOSIS — F411 Generalized anxiety disorder: Secondary | ICD-10-CM

## 2013-04-16 DIAGNOSIS — G4733 Obstructive sleep apnea (adult) (pediatric): Secondary | ICD-10-CM

## 2013-04-16 DIAGNOSIS — D869 Sarcoidosis, unspecified: Secondary | ICD-10-CM

## 2013-04-16 DIAGNOSIS — J45909 Unspecified asthma, uncomplicated: Secondary | ICD-10-CM

## 2013-04-16 DIAGNOSIS — M1990 Unspecified osteoarthritis, unspecified site: Secondary | ICD-10-CM

## 2013-04-16 DIAGNOSIS — I2510 Atherosclerotic heart disease of native coronary artery without angina pectoris: Secondary | ICD-10-CM

## 2013-04-16 DIAGNOSIS — I10 Essential (primary) hypertension: Secondary | ICD-10-CM

## 2013-04-16 DIAGNOSIS — E119 Type 2 diabetes mellitus without complications: Secondary | ICD-10-CM

## 2013-04-16 DIAGNOSIS — E785 Hyperlipidemia, unspecified: Secondary | ICD-10-CM

## 2013-04-16 DIAGNOSIS — I679 Cerebrovascular disease, unspecified: Secondary | ICD-10-CM

## 2013-04-16 NOTE — Telephone Encounter (Signed)
They are also needing a verbal for home physical therapy [place]  [phone]

## 2013-04-16 NOTE — Telephone Encounter (Signed)
Pt is sch to f/u with ch[facility] clinic 9/30, left mess with Rosanne that Dr Gala Romney can sign orders and provided her with our phone and fax number

## 2013-04-16 NOTE — Progress Notes (Signed)
Subjective:    Patient ID: [name], female    DOB: [birth year], 67 y.o.   MRN: [id number]  HPI 67 y/o BF here for a follow up visit... he has multiple medical problems as noted below...    ~  August 16, 2011:  6wk ROV & post hosp check> she was Adm by [facility] 1/8 - 08/12/11 w/ dyspnea (DOE, orthopnea, edema) thought to be due to diastolic heart failure (2DEcho w/ Gr2DD, BNP=2200), she was diuresed & improved, disch on LASIX40mg - 1/2 tab each am & her prev Diovan was stopped (due to renal insuffic w/ Creat=2.4)...  Her serum Calcium levels were all norm 9.6-9.8;  A1c=9.3;  Hg=10.4>> See below...    Feeling better since disch;  Weight 196# is still 8# heavier than her last visit w/ me 11/12;  We decided to decr her Pred from 10-5 Qod to 5mg /d & keep Lasix at 20mg /d... LABS 1/13:  BUN=33, Creat=1.7, BNP=100;  Ca=9.7;  Hg=12.9;  BS=69 & she will f/u w/ DrAltheimer...  ~  October 06, 2011:  111mo ROV on Pred 5mg /d & generally stable but w/ mult minor somatic complaints- sl SOB, some cough, thick white mucous, right arm discomfort, & reflux symptoms;  She had a ZPak called in about 2wks ago;  She is on a good antireflux regimen, plus Flonase & Saline;  We will add Tussionex for prn use;  Unfortunately weight is up 9# further tp 205# today;  BP remains controlled on 5 MEDS (see below);  She continues to f/u w/ DrAltheimer for DM> on Levimir 46u & Apidra 03-15-13;  We decided to cut the Pred to 5mg  QOD & keep the Lasix 20mg /d... LABS 3/13:  BS=135,  BUN=38, Creat=1.6;  Ca=10.1;  ACE=48;  BNP=128...  ~  January 06, 2012:  57mo ROV & [name] has been on Pred5mg Qod for the last 57mo; she has noted some joint pains & has rash on her arms- seeing derm w/ hx Psoriasis but she feels their topical rx isn't working; she also notes sl cough, some greenish sput production, chest feels tight, & some right sided chest discomfort- she is requesting a ZPak which seems to work for her...     She saw DrAlt 4/13> Ave BS from her  machine 249 & A1c=9.1, insulin adjusted; FLP looked good on Lip20; extensive note reviewed...    [name], meds, xrays and labs> see below>> LABS 6/13:  Chems- BS=169 BUN=41 Creat=2.3 BNP=104 Ca=11.0 ACE=86;  CBC- ok w/ Hg=12.4 REC> Stop Lasix20 for now, increase Pred to 10mg /d, ROV 6wks w/ BMet...  ~  March 30, 2012:  57mo ROV & post-ER visit> she's been to the ER twice in the last 111mo w/ SOB, cough, congestion, etc; on Pred, Advair, Mucinex, Tussionex, & given antibiotics, Neb Rx, etc & improved;  Also rec to restart     She saw DrWall for Cards f/u 7/13> known CAD, DOE, good BP control & 2DEcho showed good LVF & no evid for pulmHTN; continue same meds, & Lasix...    She had f/u DrAltheimer 6/13> DM, renal insuffic, hyperlipidemia, hypercalcemia, etc; on Levemir80, Apidra20-34-30, [id number] & Amaryl4; BS=118 A1c=8.8.Marland KitchenMarland Kitchen [name], meds, xrays and labs> see below for updates >> CXR 8/13 showed cardiomeg, ?mild interstitial edema, mild DJD TSpine... EKG 8/13 showed NSR, rate93, min NSSTTWA, NAD... LABS 8/13:  CBC- Hg=11.9 WBC=18K;  Chems- BUN=30 Creat=1.6 Ca=10.0 BNP=2960 ==> started back on Lasix40/d...  ~  April 28, 2012:  11mo ROV & last  visit we decreased the Pred to 5mg /d (on this for hypercalcemia from Sarcoid) & decreased the Lasix to 20mg /d (due to RI w/ Cr=2.2);  Follow up labs today showed Ca=9.1 and we decided to decr the Pred further to 5mg  Qod;  In addition her BUN=40, Creat=2.1, BNP=196 (ok to continue Lasix20); she understands that if her Creat starts to rise further that we will have to send her to Nephrology...    She saw DrAltheimer for f/u 9/13> note reviewed & he adjusted her insulin regimen- Levemir 80uQam & Apidra 20-34-30 + SS adjustments...    She fell 8/30 & hit her head> went to ER & DrLockwood's note is reviewed- c/o headache & left knee pain; Exam showed forehead hematoma & CTBrain showed left frontal scalp hematoma, no fx, mild cortical atrophy,  mild carotid atherosclerosis, NAD;  Maxillofacial CT was neg w/o facial bone fx;  Left knee prosthesis & no acute changes...    She reports that she just had an ESI injection from DrRamos for LBP & she is improved...    [name], meds, xrays and labs> see below for updates >> LABS 9/13:  Chems- BS=234, Ca=9.1, BUN=40, Creat=2.1, BNP=196   ~  June 21, 2012:  3mo ROV & [name] went to the ER 06/14/12 w/ c/o SOB> she had developed a sore throat, wheezing, cough, & SOB; she had taken ZPak, Delsym, Proair which helped somewhat;  CXR showed cardiomeg, pulm vasc congestion, DJD in TSpine, NAD;  ER treated w/ Pred boost to 40mg /d, & NEB Rx which really helped;  She notes stuff head & some drainage, denies GI/ reflux symptoms;  She is obese, cushingoid, and we were in the process of weaning off the Pred when it was boosted up by ER- we gave her a NEB Rx & this really helped;  Placed on O2 by nasal cannula for hypoxemia- 84% on RA;  Decision made to wean Pred down to 5mg Qod as before, continue Advair250Bid, add NEBS Qid w/ Albut vs Proair, plus her Mucinex/ Fluids/ etc...    She continues her regular f/u appts w/ DrAltheimer, Endocrine> on Levemir, Apidra, Amaryl, Januvia; last A1c=8.1 is Sep2013...    [name], meds, xrays and labs> see below for updates >>   ~  October 25, 2012:  46mo ROV & post hospital visit> she was [facility] 3/18 - 10/22/12> presented w/ weakness, difficulty ambulating, sl confusion w/ memory loss; she had been to DrAltheimer's office & labs showed Ca>14 w/ Creat>4;  Hypercalcemia related to Sarcoidosis- treated w/ IVF, Lasix, Calcitonin, & Solumedrol- ACE level=80;  She was disch on Pred20Bid but we will need to wean this quickly due to her DM;  She had acute renal failure as well w/ Creat>4 and after hydration she was disch w/ Creat ~2;  DM followed & treated by DrAltheimer;  DCSummary reviewed in detail>>    Sarcoidosis w/ HYPERCALCEMIA> Hypercalcemia returned  when Pred was cut to 5mg  Qod; now back on Pred20Bid post hosp & Ca=10.6; we discussed wean to 20mg /d w/ ROV & recheck Ca level in 3 weeks...     HBP> on Metop25- 1/2, Norvasc10, Apres10Tid, Clonidine0.1Bid, Lasix20, K10-2/d; BP= 160/72 & she denies CP, palpit, dizzy, ch in SOB/ DOE, edema, etc...    Cardiac Arrhythmia> she has WCT & SBrady- eval by DrAllred for Cards & meds adjusted...    CHOL> on Lip20; last FLP was 4/12- looked good & we reviewed low fat diet restrictions...    DM  on Insulin> followed by DrA on Levemir30 & ApidraSS coverage at meals; BS at home varies 150-400+, labs done Q39mo by DrAltheimer...    Obesity> wt= 203# (63"Tall & BMI=37) and we reviewed diet, exercise, & wt reduction strategies...    GI> GERD on NexiumBid; and we reviewed antireflux regimen (elev HOB, npo after dinner, etc)...    Renal Insuffic> Creat has returned to 2.08 now; advised incr free water intake...    Anxiety> on Alprazolam prn; she's been under extra stress w/ loss of job, this illness, family issues... [name], meds, xrays and labs> see below for updates >>   ~  November 08, 2012:  2week ROV & she has been stable on Pred20mg /d; still tired, notes DOE w/ walking; BMet today shows K=5.1, BUN=51, Cr=2.1, Ca=9.4; we discussed decr Pred to 10mg /d, incr free water intake, continue Lasix20; ROV 34mo...    Meds reviewed> HBP controlled on her ; Chol is ok on Lip20; DM regulated by DrAltheimer on Levimir & Apidra; wt is unfortunately up to 205# & we reviewed diet/ exercise/ etc; she is coping well w/ her serious medical problems...  ~  Dec 19, 2012:  6wk ROV & recheck after her last visit w/ stable numbers on Pred20mg /d; we decided to decr to 10mg /d & follow up at this time to recheck her Calcium etc; unfortunately she made a mistake & has continued the Pred at the 20mg /d dose since her last visit... Feeling OK overall, her wt is up 3# to 207# today, BP= 138/72, and she recently had f/u DrAltheimer to  recheck her DM (insulin adjusted)...  We reviewed her medication record & her procedures and instructed her to decr the Pred to 10mg /d at this point... We wil  Recheck pt in 6-8weeks w/ BMet at that time...  ~  February 19, 2013:  64mo ROV & when last seen Ardelia cut her Pred to 10mg /d> she sees DrAltheimer frequently w/ extensive lab work done at each visit; last labs avail to [place] 6/14 showed Calcium=9.6, BUN=36, Cr=1.86, A1c=8.5; We decided to continue Pred taper & she is instructed to decr the Pred to 05-06-09-5 Qod w/ ROV planned 65mo...  She has Home O2 for her Sarcoid, chronic hypoxemic resp failure, DiastolicCHF> we did Ambulatory O2 check today>  O2sats on RA:  Rest= 94% w/ pulse72;  After 3laps= 82% w/ pulse=90...     OSA on CPAP but only using 1-2d/wk    BP= 122/80 on her cardiac meds> Metop, Amlod, Hydral, Clonid, Lasix40; she denies CP, palpit, dizzy/ syncope, SOB, edema...    DM treated by DrA w/ Levemir 78uAM-0uPM, Apidra 18-30-26, Glimep4, [id number]; he continues to check her every month w/ extensive labs & his notes reviewed...    Despite diet etc her weight is up to 214# & we reviewed wt reduction strategy...    Renal insuffic w/ Cr ~2 chronically [name], meds, xrays and labs> see below for updates >>   ~  April 16, 2013:  64mo ROV & post hosp check> Kristell was Adm by [facility] 9/1 - 04/10/13 w/ acute on chronic diastolic CHF when she presented w/ increased SOB & edema; BNP was 2600, CXR showed vasc congestion, 2DEcho w/ EF=60-65% & Gr2DD, Creat= 2.5 but increased to 4.1 w/ diuresis; that forced them to replace some fluids, she was disch on Demadex40...   Since disch she has been back to see DrAltheimer 9/12 w/ labs showing BS=255, A1c=8.1, BUN=73, Cr=2.94 and she has an upcoming appts  w/ Nephrology & the CHF clinic soon...  Of note the Hospitalist team held her Pred during the hosp & didn't restart after disch- recall hx Sarcoidosis w/ primarily severe hypercalcemia as  manifestation, prev splenectomy w/ transient improvement then recurrent hypercalcemia & Rx w/ Pred w/ nice response but calcium bumped up to >14 when Pred weaned to 5mg  Qod in early 2014; we have slowly diminished her dose back to 10-5 Qod when last seen & review of labs showed Calcium levels all wnl in hosp and measured 10.1 in DrAltheimer's office 9/11... We discussed options here & we decided to RESTART PRED 10-5 QOD for the next few weeks (she is feeling weak since off the Pred) then taper to 5mg /d til return o\in [id number] & we will check BMet & ACE level at that time...          Problem List:    OBSTRUCTIVE SLEEP APNEA - Sleep Study 11/07 showed RDI=21 w/ desat to 62% during REM.Marland Kitchen. eval by DrSood w/ Rx for CPAP 12... compliance poor- only using it 1-2 times/wk... encouraged to use CPAP more regularly & to f/u w/ DrSood regarding the mask interface problems... ~  8/11:  She reports new mask but still not using CPAP regularly, "I rest fairly well". ~  7/12:  She reports not using her CPAP, and not resting well... ~  1/13:  CPAP used briefly during [facility] for diastolic CHF, but still not compliant w/ home use... ~  7/14:  OSA on CPAP but only using 1-2d/wk... we did Ambulatory O2 check today>  O2sats on RA:  Rest= 94% w/ pulse72;  After 3laps= 82% w/ pulse=90...   BRONCHITIS, ACUTE - on ADVAIR 250Bid (using Prn only) & PROAIR as needed. 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 ~  CXR 1/09 was pre-op for TKR- sl cardomeg & ?mild vasc congestion. ~  CXR 2/11 showed norm heart size & vascularity, clear, s/p cholecystectomy, DJD in TSpine. ~  CXR & CT Chest 11/11 showed sl peribronch thickening, no lung lesions, no signif adenopathy. ~  PET scan 1/12 showed hypermetabolic activ in spleen & lymph nodes in the supraclav, hilar, mediastinal, periaotic & iliac region as well. ~  CXRs 3/12 in the perioperative period after splenectomy showed left  effusion==> tapped & improved aeration. ~  CXR 4/12 back to baseline w/ clear bases, essent wnl... ~  CXR 7/12 showed norm heart size, clear lungs, no definite adenopathy, NAD... ~  LABS 7/12 w/ ACE=78 (8-52), Ca=12.0, AlkPhos=176 (39-117), GGT=151 (7-51) ~  Labs from DrAltheimer> 8/12: Ca=12.4, Creat=1.8; then 9/12: Ca=13.4, Creat=2.3;  ~  10/12:  PREDNISONE 20mg /d started 10/12 & 3wks later Ca=10.3, Creat=1.8; therefore weaned to 20-10 Qod. ~  11/12: Labs on Pred 20-10 Qod showed Ca++= 9.8; we will continue to wean Pred. ~  1/13:  CXR showed cardiomeg, mild interstitial edema, DJD spine; Labs showed Ca=9.7 & we decided to wean Pred to 5mg /d... ~  3/13:  Labs showed Ca=10.1 on Pred 5mg /d; decided to wean to 5mg Qod... ~  6/13:  Labs showed Ca=11.0, ACE=86; on Pred5Qod & rec to incr back to 10mg /d... ~  8/13:  CXR showed cardiomeg, ?mild interstitial edema, mild DJD TSpine... ~  Labs 8/13 showed Ca=10.0 on Pred10/d; rec to decr further to 5mg /d... ~  Labs 9/13 showed Ca= 9.1 on Pred5mg /d & she will try to decr to 5Qod again... ~  11/13: she recently went to ER w/ incr SOB & was placed on Pred40/d;  we are adding NEBS w/ ALBUT2.5mg Qid & wean Pred back to 5mg Qod as before. ~  3/14: she was hosp w/ Ca>14 (on the Pred5mg Qod); treated w/ IVF, Lasix, Calcitonin & incr steroids; Disch on Pred20Bid; we will wean Pred to 20mg /d & f/u in 3wk. ~  4/14:  On Pred20/d & calcium improved to 9.4; we will decr to Pred10mg /d & f/u 13mo... ~  7/14:  On Pred10mg /d & calcium remains well controlled at 9.6 and we decided to decr Pred to 05-06-09-5 Qod... ~  9/14:  Hosp for acute on chr diastolic CHF & [facility] stopped her Pred; Calcium levels in hosp were 9.6-10.1 and recent labs by DrAltheimer showed Ca=10.1; REC- restart Pred 10-5 QOD & try to wean to 5mg /d w/ f/u labs in 33mo...  HYPERTENSION >> on numerous meds w/ med changes over time >> she is asked to bring all med bottles to the visits but never does! ~  1/13: Post  Hosp BP= 146/70 on ? - Metop12.5, Amlod10, Clonid0.1Bid, Lasix20; she denies CP, palpit, SOB, edema> improved from recent hosp... ~  3/13: She called in the interim for "refill" Apresoline not prev on any med list; BP= 150/80 on all 5 meds; must elim sodium & get wt down... ~  6/13:  BP= 142/80 on but BUN-41 Creat=2.3 BNP=104; rec to stop Lasix for now, f/u BMet 6wks. ~  8/13:  BP= 140/70 on same meds & BUN=30, Creat=1.6, but BNP incr to 2960 & LASIX 40mg /d restarted... ~  9/13:  Follow up labs showed BUN=45, Creat=2.2 on Lasix40 and we rec decr to Lasix20... ~  9/13:  BP= 122/74 on w/ BUN=40, Creat=2.1, & BNP=196... Continue same. ~  11/13:  BP= 142/68 on same ... ~  3/14:  BP= 160/72 on same 5 meds for now... ~  4/14:  BP= 128/70 on her regimen...  ~  7/14:  BP= 122/80 on her 5 cardiac meds> Metop, Amlod, Hydral, Clonid, Lasix40; she denies CP, palpit, dizzy/ syncope, SOB, edema... ~  9/14:  She was hosp w/ acute on chronic diastolic CHF & meds changed; disch on Metoprolol25-1/2, Bidil 20-37.5Tid, Demadex20-2/d; BP= 128/70 but recent labs by DrA showed BUN=73, Cr=2.94 & she has f/u w/ Neph & CHF clinic...  CAD, CHRONIC DIASTOLIC CHF,  & CARDIAC ARRHYTHMIA >> on ASA 81mg /d...  ~  Hx non-obstructive CAD w/ cath 6/07 by DrStuckey showing luminal irregularities & tiny first marginal branch of the CIRC w/ ostial lesion... good LVF. ~  2DEcho 9/07 showed mildly calcif AoV and normal LVF & wall motion. ~  2DEcho 11/11 showed norm LV wall thickness, norm LVF w/ EF= 60-65%, norm atria, norm valves, trivial peric fluid behind heart. ~  2DEcho 1/13 showed mild LVH, norm LVF w/ EF=55-60% & no regional wall motion abn, Gr2DD, norm RV... ~  3/14: In hosp for hypercalcemia etc> she had cardiac arrhythmia w/ WCT & SBrady- to be f/u DrAllred... ~  5/14: she had f/u DrAllred> hosp f/u for WCT (atrial tachy per EP), pt asymptomatic- denies CP, palpit, SOB, dizzy/ syncope, edema, etc; no  further w/u planned, f/u prn... ~  7/14: she had f/u DrWall> chr diastolicCHF, nonobstructiveCAD, mildMR- stable, no changes made, f/u 73yr... ~  9/14:  She was adm by [facility] w/ acute on chr diastolicCHF (BNP=2600, CXR w/ vasc congestion, 2DEcho w/ EF60-65% & Gr2DD); meds adjusted & diuresed but Creat incr to 4.1; eventually disch on Demadex40 & Cr= 3.3=>2.9; she has appt in CHF clinic...  CEREBROVASCULAR DISEASE -  she remains on ASA 81mg /d without TIA's or other neuro manifestations...  ~  MRA Br 9/07 showed mod intracranial atherosclerotic changes...   HYPERLIPIDEMIA - on LIPITOR 20mg /d... FLP monitored at each OV by DrAltheimer... ~  FLP 7/07 showed TChol 114, TG 85, HDL 28, LDL 69 ~  FLP 4/09 showed TChol 154, TG 192, HDL 34, LDL 82... discussed poss of adding fibrate- hold... ~  FLP 2/10 on Lip20 showed TChol 159, TG 207, HDL 29, LDL 83... rec> add Fenoglide (she didn't). ~  FLP 2/11 on Lip20 showed TChol 185, TG 242, HDL 43, LDL 99... add FENOFIBRATE 160mg /d... ~  FLP 8/11 on Lip20+Feno160 showed TChol 167, TG 216, HDL 37, LDL 96... she stopped Feno160. ~  FLP 4/12 on Lip20 showed TChol 146, TG 115, HDL 35, LDL 88 ~  FLP 2/13 on Lip20 showed TChol 159, TG 176, HDL 41, LDL 86 ~  LABS checked by DrAltheimer  DM - on LEVEMIR & APIDRA SS coverage now per DrAltheimer; plus prev on GLIMEPIRIDE 4mg /d and JANUVIA 50mg /d. ~  labs 4/08 showed BS=138 & HgA1c=7.5.Marland Kitchen. home BS = 100 to >300... misses doses and not on diet or exercising... ~  1/09 became hypoglycemic in hosp on 4 meds post knee surg- meds adjusted... ~  3/09 restarted GLIMEPIRIDE 4mg - 1/2 tab each AM, & continue Lantus & Metformin... ~  labs 4/09 showed BS= 148, HgA1c= 7.3.Marland Kitchen. rec> back on 4 med regimen due to poor control at home. ~  6/09 discussed titrating the Lantus up until FBS 100-120 range... ~  labs 2/10 showed BS= 164, HgA1c= 8.6.Marland KitchenMarland Kitchen very disappointing- needs home BS monitoring, incr Lantus. ~  labs 2/11 showed BS= 298, A1c=  8.4...  discussed change Lantus to LEVEMIR 40 u daily... ~  labs 8/11 showed BS= 202, A1c= 10.9.Marland Kitchen. rec> incr Levemir to 50, consider Humalog. ~  Nov-Dec 2011:  BS up w/ adjust of meds & addition of Pred after hosp 11/11 & hypercalcemia... ~  She states she returned to Levemir 40u daily after the splenectomy hosp due to appetite etc;  4/12 BS=117 ~  Labs 7/12 showed BS= 167, A1c= 8.4> on Levemir 50u/d & Amaryl 4mg  Qam (note creat 1.9). ~  8-10/12: on Levemir30 + ApidraSS per DrA & BS are 150-400+ due to the Pred20; A1c 8/12 was 7.6 ~  11/12:  DrA has increased her Levemir to 46u/d & the Apidra to 10+SS cover Tid... BS here= 168 ~  1/13: Covered w/ SSI in Hosp but A1c=9.3 (Creat up to 2.4 but improved to 1.7 w/ adjust meds)... ~  DrAltheimer's notes indicates continued adjustments in her insulin regimen w/ Levemir & Apidra... ~  9/14:  He prev oral meds were stopped & currently taking Levemir40Bid & Apidra incr to 24-34-30 by DrA; recent BS=255, A1c=8.1  OBESITY (ICD-278.00) - weight down to 171 after hosp 11/11- doing better on diet, not yet exercising. ~  weight 220-230# in the early 1990's... ~  weight 210-220# in the early 2000's... ~  weight 2/10 = 192# ~  weight 2/11 = 186# ~  weight 8/11 = 174# ~  weight 12/11 = 171# ~  Weight 4/12 = 168# ~  Weight 7/12 = 171# ~  Weight 11/12 = 188# ~  Weight 1/13 = 196# ~  Weight 3/13 = 205#... She MUST get back on diet, incr exercise, get wt down. ~  Weight 6/13 = 205# ~  Weight 8/13 = 210# ~  Weight 9/13 = 220#... What happened? ~  Weight 11/13 = 225# and BMI= 40 ~  Weight 3/14 = 203# ~  Weight 7/14 = 214# ~  Weight 9/14 = 216#  HYPERCALCEMIA> this was most likely due to sarcoidosis w/ extensive splenic involvement;  Calcium was as hight as 13.7 and returned to normal after the splenectomy. ~  Labs 4/12 showed calcium = 11.1 & this is very disappointing... ~  Labs 7/12 showed Ca= 12.0, Phos= 4.0, ACE= 78 ~  Labs 8-10/12 showed Ca up to 13.4,  then down to 10.3 on PRED20mg /d... ~  Labs 11/12 showed Ca= 9.8 on Pred 20-10 qod; we will continue to wean. ~  Labs 1/13 in [place] showed Ca= 9.7 range on Pred 10-5 Qod;  We decided to wean Pred to 5mg /d... ~  Labs 3/13 showed Ca=10.1 on Pred5mg /d; rec to decr to 5mg Qod... ~  Labs 6/13 showed Ca=11.0 on Pred5Qod; rec incr to 10mg /d... ~  Labs 8/13 showed Ca=10.0 on Pred10/d; rec to decr to 5mg /d... ~  Labs 9/13 showed Ca=9.1 on Pred5mg /d; rec to decr to 5mg Qod... ~  Labs 12/13 showed Ca= 9.2 to 10.5 on Pred 5mg Qod... ~  Labs 3/14 in hosp showed Ca= 14.5 & disch on Pred20Bid; 3/14 f/u Ca=10.6 & Pred decr to 20mg /d... ~  4/14: on Pred20/d and f/u labs showed Ca= 9.4... Rec to decr Pred to 10mg /d & f/u 25mo... ~  7/14: on Pred10/d and most recent Ca= 9.6.Marland KitchenMarland Kitchen rec to decr Pred to 05-06-09-5 Qod... ~  9/14: Pred was stopped by [facility] duringh 9/14 hosp & serial calcium levels reviewed- 9.6=>10.1; we decided to restart her Pred10-5 QOD for now & try to wean to 5mg /d by f/u in 9mo w/ repeat labs...  GERD - on NEXIUM 40mg  Bid... w/ increased reflux symptoms w/ noct cough etc...  ~  2/10: discussed optimal Rx w/ Nex before dinner, Zantac300 + Reglan10 at bed, elev HOB, etc. ~  2/11: she stopped the Reglan, still using Nexium/ Zantac but w/ persist symptoms>  refer to GI for eval. ~  6/11: GI f/u DrPerry w/ EGD that was normal... continue Rx. ~  12/11:  increased GI symptoms post hosp> she will f/u w/ DrPerry for his input> incr Nexium Bid. ~  She remains on Nexium 40mg Bid regularly...  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 ~  6/11:  f/u colonoscopy by DrPerry showed divertics, 4 polyps, AVM.Marland KitchenMarland Kitchen path= tubular adenoma, f/u 76yrs. ~  9/14: she describes abd cramps, sick feeling when she has to go- better after BM, takes Senakot-S and Bentyl10Qid prn...  SPLENOMEGALY w/  innumerable ~1cm lesions ?etiology >> SEE ABOVE ~  2/12:  S/p open splenectomy by DrThompson w/ extensive granulomatous inflamm found... ~  Note: Serum Calcium ret to normal immediately after spleen removed, but hypercalcemia returned after several months forcing [place] to start Rx w/ Pred...  RENAL INSUFFICIENCY (ICD-588.9) >> Creat ~ 2.0 & eval 10/11 by DrFox- prob due to DM & HBP... ~  Labs 4/12 showed BUN= 27, Creat= 1.6 ~  Labs 7/12 showed BUN= 44, Creat= 1.9 (not on diuretics/ NSAIDs/ etc)... ~  Labs showed Creat up to 2.3 when Ca=13.4; now improved to 1.8 w/ Ca coming down... ~  Labs 11/12 showed BUN=38, Creat=1.7; rec to maintain hydration... ~  Labs 1/13 in [place] w/ diuresis showed Creat incr to 2.4 but ret to 1.7 w/ adjustment in meds... ~  Labs 3/13 showed BUN=38, Creat= 1.6 ~  Labs 6/13 showed BUN=41, Creat= 2.3; rec> stop Lasix20 for now, liberalize fluids. ~  Labs 9/13 showed BUN=40, Creat=2.1 on Lasix20, rec to continue the same. ~  Labs 12/13 showed Creat= 1.5 ~  Labs 3/14 in hosp showed Creat=4, then decr to 2.00 by dischage... ~  Labs 4/14 showed BUN=51, Creat=2.1, and rec to incr free water as we wean the Pred... 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 on LYRICA 100mg  Qhs, off prev Tramadol Rx... ~  2010 eval by DrHiatt @ [facility] on Tramadol 50mg , Lyrica 75mg Bid...  GOUT, UNSPECIFIED (ICD-274.9) - Uric in the 7-8 range...on ALLOPURINOL 300mg /d for prevention...  ANXIETY (ICD-300.00) - she is under mod stress- work, mother died, hospice counselling, etc... ~  8/11:  rec starting Alprazolam 0.5mg  Tid for palpit, SOB, anxiety...  ANEMIA-NOS & MGUS >> see eval 10-11/11 by DrGranfortuna w/ bone marrow 11/11  in hosp= neg. ~  Labs 4/12 showed Hg= 11.6, MCV= 84... On FeSO4 daily. ~  Labs 7/12 showed Hg= 12.8, MCV= 90, Fe= 45 (14%sat)... ~  Labs 1/13 in [place] showed Hg=10.4 but improved to 12.9 on post hosp check in office... ~  Labs 6/13 showed Hg= 12.4 ~  Labs 12/13 showed Hg= 13.9 ~  Labs 3/14 showed Hg= 12.9 ~  Labs 9/14 during hosp showed Hg= 10-11 range...  PSORIASIS - eval and Rx per DrTafeen prev on Humira injections- stopped 9/11...   Past Surgical History  Procedure Laterality Date  . Vesicovaginal fistula closure w/  total abdominal hysterectomy  1992  . Bilateral total knee replacements Bilateral Rt=5/04 & Lft=1/09    by DrAlusio  . 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 Abbey Chatters  . Appendectomy  1957  . Tonsillectomy  1968  . Cholecystectomy  1980's  . Abdominal hysterectomy  1992  . Reduction mammaplasty Bilateral 1980's  . Cardiac catheterization  1990's  . Thoracentesis  2012    Outpatient Encounter Prescriptions as of 04/16/2013  Medication Sig Dispense Refill  . albuterol (PROVENTIL HFA;VENTOLIN HFA) 108 (90 BASE) MCG/ACT inhaler Inhale 2 puffs into the lungs every 6 (six) hours as needed for wheezing or shortness of breath.      . allopurinol (ZYLOPRIM) 100 MG tablet Take 100 mg by mouth daily.      Marland Kitchen aspirin EC 81 MG tablet Take 81 mg by mouth daily.      Marland Kitchen atorvastatin (LIPITOR) 20 MG tablet Take 20 mg by mouth daily.      Marland Kitchen dicyclomine (BENTYL) 10 MG capsule Take 10 mg by mouth 4 (four) times daily -  before meals and at bedtime.      Marland Kitchen esomeprazole (NEXIUM) 40 MG capsule Take 1 capsule (40 mg total) by mouth 2 (two) times daily.  90 capsule  3  . ferrous sulfate 325 (65 FE) MG tablet Take 325 mg by mouth daily with breakfast.      . fluticasone (FLONASE) 50 MCG/ACT nasal spray Place 2 sprays into the nose at bedtime.  48 g  3  . Fluticasone-Salmeterol (ADVAIR) 250-50 MCG/DOSE AEPB Inhale 1 puff into the lungs 2 (two) times daily.  3 each  3  .  HYDROcodone-acetaminophen (NORCO/VICODIN) 5-325  MG per tablet Take 2 tablets by mouth every 4 (four) hours as needed for pain.  20 tablet  0  . insulin detemir (LEVEMIR) 100 UNIT/ML injection Inject 0.4 mLs (40 Units total) into the skin 2 (two) times daily.  10 mL  12  . insulin glulisine (APIDRA SOLOSTAR) 100 UNIT/ML injection Inject 18-30 Units into the skin 3 (three) times daily before meals. Use 18  units before breakfast and 30 units before lunch and 26  units before dinner      . isosorbide-hydrALAZINE (BIDIL) 20-37.5 MG per tablet Take 1 tablet by mouth 3 (three) times daily.  60 tablet  0  . ketorolac (ACULAR LS) 0.4 % SOLN Place 1 drop into both eyes 3 (three) times daily.       . Lancets MISC 1 Units by Does not apply route 4 (four) times daily - after meals and at bedtime.      Marland Kitchen LORazepam (ATIVAN) 1 MG tablet Take 1 mg by mouth daily as needed.       . metoprolol tartrate (LOPRESSOR) 25 MG tablet Take 0.5 tablets (12.5 mg total) by mouth daily.  45 tablet  3  . predniSONE (DELTASONE) 10 MG tablet Alternate dosing with 10 mg , 5 mg , 10 mg, 5 mg      . pregabalin (LYRICA) 100 MG capsule Take 1 capsule (100 mg total) by mouth every evening.  90 capsule  3  . sennosides-docusate sodium (SENOKOT-S) 8.6-50 MG tablet Take 1 tablet by mouth at bedtime.      . torsemide (DEMADEX) 20 MG tablet Take 2 tablets (40 mg total) by mouth daily.  40 tablet  0   No facility-administered encounter medications on file as of 04/16/2013.    Allergies  Allergen Reactions  . Adhesive [Tape] Other (See Comments)    Blisters   . Avelox [Moxifloxacin Hcl In Nacl]     GI upset  . Codeine Other (See Comments)    "crazy"   . Guaifenesin Nausea And Vomiting  . Latex Swelling  . Oxycodone Nausea And Vomiting    Current Medications, Allergies, Past Medical History, Past Surgical History, Family History, and Social History were reviewed in [employer] record.   Review of  Systems         See HPI - all other systems neg except as noted... The patient complains of dyspnea on exertion.  The patient denies anorexia, fever, weight loss, weight gain, vision loss, decreased hearing, hoarseness, chest pain, syncope, peripheral edema, prolonged cough, headaches, hemoptysis, abdominal pain, melena, hematochezia, severe indigestion/heartburn, hematuria, incontinence, muscle weakness, suspicious skin lesions, transient blindness, difficulty walking, depression, unusual weight change, abnormal bleeding, enlarged lymph nodes, and angioedema.     Objective:   Physical Exam      WD, Overweight, 67 y/o BF in NAD...  GENERAL:  Alert & oriented; pleasant & cooperative... HEENT:  [place]/AT, EOM-wnl, PERRLA, EACs-clear, TMs-wnl, NOSE-clear, THROAT- clear & wnl... NECK:  Supple w/ fair ROM; no JVD; normal carotid impulses w/o bruits; palp thyroid, w/o nodules felt; no lymphadenopathy. CHEST:  Clear to P & A; without wheezes/ rales/ or rhonchi. HEART:  Regular Rhythm; gr 1/6 SEM, without rubs/ or gallops. ABDOMEN:  Scar from splenectomy surg; obese, soft, & nontender w/ panniculus; normal bowel sounds; no organomegaly or masses detected EXT: without deformities, mod arthritic changes, s/p bilat TKRs; no varicose veins/ +venous insuffic/ tr edema. NEURO:  CN's intact; no focal neuro deficits... DERM:  Psoriasis rash per DrTafeen.Marland KitchenMarland Kitchen  RADIOLOGY DATA:  Reviewed in the EPIC EMR & discussed w/ the patient...  LABORATORY DATA:  Reviewed in the EPIC EMR & discussed w/ the patient...   Assessment & Plan:    SARCOIDOSIS>  Main manifestation has been her hypercalcemia> she has proven that when her Pred is weaned to 5mg Qod the hypercalcemia returns; we are trying to determine her lowest dose threshold & Ca was 9.6 on Pred10mg /d Jun2014; Rec to wean slowly to 05-06-09-5 but this was STOPPED by [facility] 9/14 [facility]; we discussed restarting Pred10-5 QOD & wean to 5mg /d w/ ROV & labs in  51mo...  Hypercalcemia>  Initially responded to splenectomy but her calcium level rose again over several months after the surg; we were forced to use Pred rx in this difficult diabetic; we had her see DrAltheimer for DM/ endocrine consult; calcium levels have responded nicely to Pred Rx but climbed back up when Pred was weaned to 5mg Qod early in 2014 (see above)...    ASTHMATIC BRONCHITS>  ER 11/13 bumped her Pred to 40mg /d which negated our slow taper w/ monitoring of her Calcium levels; NEB Rx w/ albut seemed to give the the max benefit & we need to wean down the Pred as much as poss; she is also hypoxemic w/ O2sat=84% on RA (likely from V/Q mismatching);  We decided to Rx w/ HomeO2- 1L/min rest, 2L/min exercise for now; continue home meds + add NEBS Qid => continue same...  HBP>  Meds were adjusted 9/14 hosp and BP is current wnl on her on Metoprolol25-1/2, Bidil 20-37.5Tid, Demadex20-2/d since disch 9/14; she has f/u CHF clinic...  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  DM>  Control has been difficult;  Stressed diet/ exercise, & get wt down; DrA has her on Levemir & ApidraSS coverage...  Renal Insuffic>  Creat was up to 4 in [place] again & then back to 2.9 w/ adjust in meds...  Other medical problems as noted...   Patient's Medications  New Prescriptions   No medications on file  Previous Medications   ALBUTEROL (PROVENTIL HFA;VENTOLIN HFA) 108 (90 BASE) MCG/ACT INHALER    Inhale 2 puffs into the lungs every 6 (six) hours as needed for wheezing or shortness of breath.   ALLOPURINOL (ZYLOPRIM) 100 MG TABLET    Take 100 mg by mouth daily.   ASPIRIN EC 81 MG TABLET    Take 81 mg by mouth daily.   ATORVASTATIN (LIPITOR) 20 MG TABLET    Take 20 mg by mouth daily.   DICYCLOMINE (BENTYL) 10 MG CAPSULE    Take 10 mg by mouth 4 (four) times daily -  before meals and at bedtime.   ESOMEPRAZOLE (NEXIUM) 40 MG CAPSULE    Take 1 capsule (40 mg  total) by mouth 2 (two) times daily.   FERROUS SULFATE 325 (65 FE) MG TABLET    Take 325 mg by mouth daily with breakfast.   FLUTICASONE (FLONASE) 50 MCG/ACT NASAL SPRAY    Place 2 sprays into the nose at bedtime.   FLUTICASONE-SALMETEROL (ADVAIR) 250-50 MCG/DOSE AEPB    Inhale 1 puff into the lungs 2 (two) times daily.   HYDROCODONE-ACETAMINOPHEN (NORCO/VICODIN) 5-325 MG PER TABLET    Take 2 tablets by mouth every 4 (four) hours as needed for pain.   INSULIN DETEMIR (LEVEMIR) 100 UNIT/ML INJECTION    Inject 0.4 mLs (40 Units total) into the skin 2 (two) times daily.   INSULIN GLULISINE (  APIDRA SOLOSTAR) 100 UNIT/ML INJECTION    Use 24  units before breakfast and 34 units before lunch and 30  units before dinner   ISOSORBIDE-HYDRALAZINE (BIDIL) 20-37.5 MG PER TABLET    Take 1 tablet by mouth 3 (three) times daily.   KETOROLAC (ACULAR LS) 0.4 % SOLN    Place 1 drop into both eyes 3 (three) times daily.    LANCETS MISC    1 Units by Does not apply route 4 (four) times daily - after meals and at bedtime.   LORAZEPAM (ATIVAN) 1 MG TABLET    Take 1 mg by mouth daily as needed.    METOPROLOL TARTRATE (LOPRESSOR) 25 MG TABLET    Take 0.5 tablets (12.5 mg total) by mouth daily.   PREDNISONE (DELTASONE) 10 MG TABLET    10 mg ~ 5 mg every other day   PREGABALIN (LYRICA) 100 MG CAPSULE    Take 1 capsule (100 mg total) by mouth every evening.   SENNOSIDES-DOCUSATE SODIUM (SENOKOT-S) 8.6-50 MG TABLET    Take 1 tablet by mouth at bedtime.   TORSEMIDE (DEMADEX) 20 MG TABLET    Take 2 tablets (40 mg total) by mouth daily.  Modified Medications   No medications on file  Discontinued Medications   No medications on file

## 2013-04-16 NOTE — Telephone Encounter (Signed)
Spoke with patient-- Patient aware labs will be ordered Patient verbalized understanding and nothing further needed at this time

## 2013-04-16 NOTE — Telephone Encounter (Signed)
Spoke with [name] with [facility] and advised her that I am sending message to Drs. Wall and Bensimhon and HF clinic for advice regarding home health orders.  I advised [name] that someone would call her back regarding who will be signing her orders.  [name] verbalized understanding and agreement with plan of care.

## 2013-04-16 NOTE — Telephone Encounter (Signed)
Spoke with patient, patient would like to have labs drawn prior to appt November 18 @4pm  Patient would like to make sure Dr. Kriste Basque orders Ca lab to whatever other fasting lab work patient will have drawn Dr. Kriste Basque please advise, thank you!

## 2013-04-16 NOTE — Telephone Encounter (Signed)
Per SN---  Ok for bmp and ace level prior to her next ov.  thanks

## 2013-04-16 NOTE — Patient Instructions (Addendum)
Today we updated your med list in our EPIC system...    Continue your current medications the same...  We decided to continue the Prednisone at 10mg  alternating w/ 5mg  every other day for the next week or two...    Then decrease the Prednisone to 5mg  daily thereafter...  Let's plan a follow up visit w/ calcium blood level in 2 months...  Call for any questions.Marland KitchenMarland Kitchen

## 2013-04-16 NOTE — Telephone Encounter (Signed)
New problem    Will Dr. Daleen Squibb sign her home health orders. Recently discharge from hospital on  9/9.

## 2013-04-17 NOTE — Addendum Note (Signed)
Addended by: Marcellus Scott on: 04/17/2013 10:19 AM   Modules accepted: Orders

## 2013-04-18 ENCOUNTER — Telehealth: Payer: Self-pay | Admitting: Pulmonary Disease

## 2013-04-18 NOTE — Telephone Encounter (Signed)
[name], spoke with Carlyon Shadow - Reports they need written rx with Dr's NPI #, dx code, today's date and md's signature with our facility name on it for the portable o2 tank and concentrator.  This needs to be faxed to [phone]. I have placed this on SN's cart along with phone msg for signature.

## 2013-04-19 NOTE — Telephone Encounter (Signed)
rx has been faxed back and placed in the scan folder. Nothing further is needed.

## 2013-04-23 ENCOUNTER — Ambulatory Visit: Payer: Medicare Other | Admitting: Internal Medicine

## 2013-04-24 ENCOUNTER — Telehealth: Payer: Self-pay | Admitting: Pulmonary Disease

## 2013-04-24 NOTE — Telephone Encounter (Signed)
Pt states that [name] is coming out to switch out her O2 tomorrow.  Pt states she is wanting a smaller weight concentrator.  Will need an order from [facility] for this.  Pt asks to be reached at [phone].  Antionette Fairy

## 2013-04-24 NOTE — Telephone Encounter (Signed)
I spoke with [name] and she states never mind that she found the fax. Nothing further needed. Carron Curie, CMA

## 2013-05-01 ENCOUNTER — Ambulatory Visit (HOSPITAL_COMMUNITY)
Admission: RE | Admit: 2013-05-01 | Discharge: 2013-05-01 | Disposition: A | Discharge status: Home / Self Care | Payer: Medicare Other | Source: Ambulatory Visit | Attending: Cardiology | Admitting: Cardiology

## 2013-05-01 ENCOUNTER — Encounter (HOSPITAL_COMMUNITY): Payer: Self-pay

## 2013-05-01 VITALS — BP 148/88 | HR 85 | Ht 62.5 in | Wt 214.0 lb

## 2013-05-01 DIAGNOSIS — I5032 Chronic diastolic (congestive) heart failure: Secondary | ICD-10-CM

## 2013-05-01 DIAGNOSIS — I1310 Hypertensive heart and chronic kidney disease without heart failure, with stage 1 through stage 4 chronic kidney disease, or unspecified chronic kidney disease: Secondary | ICD-10-CM

## 2013-05-01 DIAGNOSIS — I5033 Acute on chronic diastolic (congestive) heart failure: Secondary | ICD-10-CM | POA: Insufficient documentation

## 2013-05-01 LAB — BASIC METABOLIC PANEL
CO2: 27 mEq/L (ref 19–32)
Chloride: 96 mEq/L (ref 96–112)
GFR calc Af Amer: 17 mL/min — ABNORMAL LOW (ref >90)
Sodium: 135 mEq/L (ref 135–145)

## 2013-05-01 MED ORDER — METOPROLOL TARTRATE 25 MG PO TABS: 12.5000 mg | ORAL_TABLET | Freq: Two times a day (BID) | ORAL | Status: DC

## 2013-05-01 MED ORDER — ISOSORB DINITRATE-HYDRALAZINE 20-37.5 MG PO TABS: 1.0000 | ORAL_TABLET | Freq: Three times a day (TID) | ORAL | Status: DC

## 2013-05-01 MED ORDER — TORSEMIDE 20 MG PO TABS: 40.0000 mg | ORAL_TABLET | Freq: Every day | ORAL | Status: DC

## 2013-05-01 NOTE — Progress Notes (Signed)
[facility] HPI: [name] is a 67 y.o. female with hx of morbid obesity, sleep apnea (noncompliant with CPAP), asthma/COPD, chronic prediosne use for sarcoidosis, CRI (baseline Cr 2.2- chronic diastolic CHF, CAD and DM She has been followed by Dr Kriste Basque for pulmonary and Dr Daleen Squibb for cardiology.  She was admitted in 9/14 with acute on chronic diastolic CHF and required Lasix gtt for diuresis.  She developed AKI.   Since discharge she reports improving activity tolerance.  She has followed up with her new Nephrologist and Pulmonologist.  She reports her weight has been stable.  Her DOE has slowly improved and she is able to perform ADLs without significant difficulty.  Able to walk around her apartment complex.  She has been compliant with her medications.  Pt denies chest pain, palpitations,  orthopnea/PND, worsening LE swelling.   She has not been using her CPAP due to waking up nightly feeling like she is being smothered.    Echo 04/03/13: EF 60-65% grade 2 DD. Mild AS. RV normal.   Creatinine 2.7>2.8>3.4>4.1>3.3, BNP 1436 (9/14)  ROS: All systems negative except as listed in HPI, PMH and Problem List.  Past Medical History  Diagnosis Date  . Unspecified essential hypertension   . CAD (coronary artery disease) 01/04/2006    Tiny OM1 70-90% ostial stenosis, no other CAD  . Asthma   . Esophageal reflux   . Obesity, unspecified   . Anxiety state, unspecified   . Hypercalcemia   . Anemia   . Other psoriasis   . Diverticulosis   . Sarcoidosis 2012  . Dyslipidemia   . COPD (chronic obstructive pulmonary disease)   . Gout   . Colon polyps     Tubular adenomatous polyps  . Spinal stenosis of lumbar region   . Bulging lumbar disc   . CHF (congestive heart failure)   . PNA (pneumonia) 2012    With pleural effusion, requiring thoracentesis  . Pneumonia     "several times" (04/02/2013)  . Pleural effusion 2012  . Chronic bronchitis   . Exertional shortness of breath      "and lying flat" (04/02/2013)  . On home oxygen therapy     "2L when I'm up; 3L when I'm at sleep" (04/02/2013)  . OSA (obstructive sleep apnea)     marginally compliant with CPAP  . Type II diabetes mellitus   . Degenerative joint disease   . Arthritis     'all over" (04/02/2013)  . Chronic lower back pain   . Kidney disease     baseline creatinie 2.0,  folowed by Dr Caryn Section  . Renal failure     "kidney's are working @ 50%" (04/02/2013)  . Diverticulosis   . GERD (gastroesophageal reflux disease)   . Anxiety    Past Surgical History  Procedure Laterality Date  . Vesicovaginal fistula closure w/  total abdominal hysterectomy  1992  . Bilateral total knee replacements Bilateral Rt=5/04 & Lft=1/09    by DrAlusio  . 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 Abbey Chatters  . Appendectomy  1957  . Tonsillectomy  1968  . Cholecystectomy  1980's  . Abdominal hysterectomy  1992  . Reduction mammaplasty Bilateral 1980's  . Cardiac catheterization  1990's  . Thoracentesis  2012   History   Social History  . Marital Status: Single    Spouse Name: N/A    Number of Children: 0  . Years of Education: N/A   Occupational History  .  Retired   . SECRETARY    Social History Main Topics  . Smoking status: Never Smoker   . Smokeless tobacco: Never Used  . Alcohol Use: No  . Drug Use: No  . Sexual Activity: Not Currently   Other Topics Concern  . None   Social History Narrative   Lives in [place] alone.  Never married.   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[employer]     PHYSICAL EXAM: VITALS: HR: 85 bpm  BP: 148/88 mmHg  TEMP:   ( )  RESP:   HT: 5' 2.5" (158.8 cm)  WT: 214 lb (97.07 kg)  BMI: Body mass index is 38.49 kg/(m^2).   General:  Comfortable appearing. No resp difficulty Sitting in chair.  HEENT: normal Neck: Thick. No JVD, no HJR.  Carotids 2+ bilaterally; no bruits. No lymphadenopathy or thryomegaly appreciated. Cor: PMI normal. Regular rate & rhythm. No rubs, gallops or  murmurs. Lungs: clear Abdomen: obese, soft, nontender, nondistended. No hepatosplenomegaly. No bruits or masses. Good bowel sounds. Extremities: no cyanosis, clubbing, rash, R and LLE trace edema Neuro: alert & orientedx3, cranial nerves grossly intact. Moves all 4 extremities w/o difficulty. Affect pleasant.  ASSESSMENT & PLAN: 1. A/C diastolic HF ECHO 04/08/03 EF 60-65% with Grade II Diastolic Dysfunction.  - Pt to continue daily diuresis with torsemide 40mg  qday, appears euvolemic today  - Has been on Bidil, can continue for BP control.  - Increase metoprolol to BID dosing  - Pt to enroll in cardiac rehab once discharged from Home Health 2. CKD    - BMET today, has established with Nephrology 3. Morbid obesity   - encouraged continued weight loss 4. OSA, non-compliant with CPAP   - encouraged daily use 5.  Mild aortic stenosis   - continue to monitor 6. Sarcoidosis  - no known cardiac involvement, probably not much utility to cardiac MRI as CKD will preclude use of gadolinium contrast.   Andrena Mews, DO 05/01/2013 4:01 PM  Patient seen with resident, agree with the above note.  She is doing well symptomatically and does not appear volume overloaded.  Management of her diastolic CHF is going to be complicated by CKD.  Creatinine was 3.3 when most recently checked.  Continue current torsemide dose and repeat BMET today.  We will see her back in 2 months.   Marca Ancona 05/02/2013

## 2013-05-01 NOTE — Patient Instructions (Addendum)
Increase Metoprolol to 12.5 mg Twice daily   Labs today  We will contact you in 2 months to schedule your next appointment.

## 2013-05-03 MED ORDER — TORSEMIDE 20 MG PO TABS: 40.0000 mg | ORAL_TABLET | Freq: Every day | ORAL | Status: DC

## 2013-05-03 MED ORDER — ISOSORB DINITRATE-HYDRALAZINE 20-37.5 MG PO TABS: 1.0000 | ORAL_TABLET | Freq: Three times a day (TID) | ORAL | Status: DC

## 2013-05-03 NOTE — Addendum Note (Signed)
Encounter addended by: Noralee Space, RN on: 05/03/2013 12:50 PM<BR>     Documentation filed: Orders

## 2013-05-09 ENCOUNTER — Encounter: Payer: Self-pay | Admitting: Internal Medicine

## 2013-05-09 ENCOUNTER — Telehealth: Payer: Self-pay | Admitting: Pulmonary Disease

## 2013-05-09 ENCOUNTER — Ambulatory Visit (INDEPENDENT_AMBULATORY_CARE_PROVIDER_SITE_OTHER): Payer: Medicare Other | Admitting: Internal Medicine

## 2013-05-09 VITALS — BP 130/74 | HR 80 | Ht 62.5 in | Wt 214.0 lb

## 2013-05-09 DIAGNOSIS — Z860100 Personal history of colon polyps, unspecified: Secondary | ICD-10-CM

## 2013-05-09 DIAGNOSIS — K5900 Constipation, unspecified: Secondary | ICD-10-CM

## 2013-05-09 DIAGNOSIS — R109 Unspecified abdominal pain: Secondary | ICD-10-CM

## 2013-05-09 DIAGNOSIS — K219 Gastro-esophageal reflux disease without esophagitis: Secondary | ICD-10-CM

## 2013-05-09 MED ORDER — LINACLOTIDE 290 MCG PO CAPS: 290.0000 ug | ORAL_CAPSULE | Freq: Every day | ORAL | Status: DC

## 2013-05-09 NOTE — Progress Notes (Signed)
HISTORY OF PRESENT ILLNESS:  [name] is a 67 y.o. female with MULTIPLE SIGNIFICANT medical problems as listed below. [name] was evaluated in May of 2011 with multiple GI complaints. See that dictation for details. [name] subsequently underwent colonoscopy and upper endoscopy in June of 2011. Colonoscopy revealed diminutive colon polyps and moderate sigmoid diverticulosis. 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 Polyps were both adenomatous and non-adenomatous. Followup in 5 years recommended. Upper endoscopy was normal. For GERD, [name] takes daily Nexium. [name] was most recently evaluated by the advanced extender 03/15/2013 with crampy abdominal pain, nausea, vomiting, and diarrhea 4 days duration. [name] was felt to have a gastroenteritis. [name] was improving at that time. [name] was treated with symptomatic therapies. Followup at this time arranged. Her problems with vomiting and diarrhea have resolved. [name] does report awakening with nausea. [name] describes uncomfortable abdomen with constipation. Symptoms seemed to improve with defecation. [name] takes Senokot at night. [name] has done this for some time. No bleeding or weight loss. No new complaints.  REVIEW OF SYSTEMS:  All non-GI ROS negative except for arthritis, back pain, cataract, fatigue, palpitation, night sweats, skin rash, ankle swelling  Past Medical History  Diagnosis Date  . Unspecified essential hypertension   . CAD (coronary artery disease) 01/04/2006    Tiny OM1 70-90% ostial stenosis, no other CAD  . Asthma   . Esophageal reflux   . Obesity, unspecified   . Anxiety state, unspecified   . Hypercalcemia   . Anemia   . Other psoriasis   . Diverticulosis   . Sarcoidosis 2012  . Dyslipidemia   . COPD (chronic obstructive pulmonary disease)   . Gout   . Colon polyps     Tubular adenomatous polyps  . Spinal stenosis of lumbar region   . Bulging lumbar disc   . CHF (congestive heart failure)   . PNA (pneumonia) 2012    With pleural  effusion, requiring thoracentesis  . Pneumonia     "several times" (04/02/2013)  . Pleural effusion 2012  . Chronic bronchitis   . Exertional shortness of breath     "and lying flat" (04/02/2013)  . On home oxygen therapy     "2L when I'm up; 3L when I'm at sleep" (04/02/2013)  . OSA (obstructive sleep apnea)     marginally compliant with CPAP  . Type II diabetes mellitus   . Degenerative joint disease   . Arthritis     'all over" (04/02/2013)  . Chronic lower back pain   . Kidney disease     baseline creatinie 2.0,  folowed by Dr Caryn Section  . Renal failure     "kidney's are working @ 50%" (04/02/2013)  . Diverticulosis   . GERD (gastroesophageal reflux disease)   . Anxiety     Past Surgical History  Procedure Laterality Date  . Vesicovaginal fistula closure w/  total abdominal hysterectomy  1992  . Bilateral total knee replacements Bilateral Rt=5/04 & Lft=1/09    by DrAlusio  . 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 Abbey Chatters  . Appendectomy  1957  . Tonsillectomy  1968  . Cholecystectomy  1980's  . Abdominal hysterectomy  1992  . Reduction mammaplasty Bilateral 1980's  . Cardiac catheterization  1990's  . Thoracentesis  2012    Social History [name]  reports that [name] has never smoked. [name] has never used smokeless tobacco. [name] reports that [name] does not drink alcohol or use illicit drugs.  family history  includes Breast cancer in her maternal aunt; Diabetes in her mother.  Allergies  Allergen Reactions  . Adhesive [Tape] Other (See Comments)    Blisters   . Avelox [Moxifloxacin Hcl In Nacl]     GI upset  . Codeine Other (See Comments)    "crazy"   . Guaifenesin Nausea And Vomiting  . Latex Swelling  . Oxycodone Nausea And Vomiting       PHYSICAL EXAMINATION: Vital signs: BP 130/74  Pulse 80  Ht 5' 2.5" (1.588 m)  Wt 214 lb (97.07 kg)  BMI 38.49 kg/m2 General: Obese/cushingoid, Well-developed, well-nourished, no acute distress HEENT: Sclerae are  anicteric, conjunctiva pink. Oral mucosa intact Lungs: Clear Heart: Regular Abdomen: soft, obese, nontender, nondistended, no obvious ascites, no peritoneal signs, normal bowel sounds. No organomegaly. Extremities: No edema Psychiatric: alert and oriented x3. Cooperative    ASSESSMENT:  #1. Abdominal discomfort associated with and likely related to constipation #2. GERD #3. Multiple medical problems #4. History of adenomatous polyps   PLAN:  #1. Trial of Linzess 290 mcg daily. Multiple samples given. If this is helpful, prescription can be provided #2. Reflux precautions #3. Continue PPI #4. Surveillance colonoscopy around June 2016 if medically fit

## 2013-05-09 NOTE — Patient Instructions (Signed)
You have been given some samples of Linzess.  Take one 30 minutes before breakfast each day.  If this works for you we will send a prescription to your pharmacy

## 2013-05-09 NOTE — Telephone Encounter (Signed)
I spoke with Okey Regal at [facility] and she states that she has tried to contact the pt several times. I provided her with the home and mobile number. She states she will call the pt right now. Carron Curie, CMA

## 2013-05-11 ENCOUNTER — Telehealth: Payer: Self-pay | Admitting: Pulmonary Disease

## 2013-05-11 MED ORDER — MAGIC MOUTHWASH: ORAL | Status: DC

## 2013-05-11 MED ORDER — AZITHROMYCIN 250 MG PO TABS: ORAL_TABLET | ORAL | Status: DC

## 2013-05-11 NOTE — Telephone Encounter (Signed)
Per SN---  MMW  4oz   1 tsp gargle and swallow four times daily as needed zpak #1  Take as directed Add mucinex 600 mg  2 po bid with plenty of fluids.

## 2013-05-11 NOTE — Telephone Encounter (Signed)
Spoke with the pt and notified of recs per SN She verbalized understanding and states nothing further needed Rxs were called to the pharm

## 2013-05-11 NOTE — Telephone Encounter (Signed)
Spoke with the pt She is c/o sore thraot, prod cough with minimal white sputum, head congestion since last night  Taking mucinex  Would like something called in  Please advise, thanks! Allergies  Allergen Reactions  . Adhesive [Tape] Other (See Comments)    Blisters   . Avelox [Moxifloxacin Hcl In Nacl]     GI upset  . Codeine Other (See Comments)    "crazy"   . Guaifenesin Nausea And Vomiting  . Latex Swelling  . Oxycodone Nausea And Vomiting   Last ov 04/16/13 Next ov 06/19/13

## 2013-05-16 NOTE — Telephone Encounter (Signed)
Patient seen 05-09-13

## 2013-05-29 ENCOUNTER — Ambulatory Visit: Payer: BC Managed Care – PPO | Admitting: Pulmonary Disease

## 2013-06-07 ENCOUNTER — Other Ambulatory Visit: Payer: Self-pay

## 2013-06-11 ENCOUNTER — Telehealth (HOSPITAL_COMMUNITY): Payer: Self-pay | Admitting: Cardiology

## 2013-06-11 ENCOUNTER — Telehealth: Payer: Self-pay | Admitting: Internal Medicine

## 2013-06-11 NOTE — Telephone Encounter (Signed)
pts weight is up 5 lbs. [phone] 138/75  96% on RA  72 Increased SOB    Please advise

## 2013-06-11 NOTE — Telephone Encounter (Signed)
Per Tonye Becket, NP increase torsemide to 40 mg BID for 2 days spoke w/pt and she is aware, she is aware if SOB not improved and wt not down she will call back, also left mess on Roxie's VM to let her know this was addressed and what the plan is

## 2013-06-14 ENCOUNTER — Telehealth: Payer: Self-pay

## 2013-06-14 MED ORDER — LINACLOTIDE 290 MCG PO CAPS: 290.0000 ug | ORAL_CAPSULE | Freq: Every day | ORAL | Status: DC

## 2013-06-14 NOTE — Telephone Encounter (Signed)
Sent rx for Linzess 

## 2013-06-14 NOTE — Telephone Encounter (Signed)
Faxed prior authorization for [employer].  Awaiting response 

## 2013-06-15 ENCOUNTER — Telehealth: Payer: Self-pay | Admitting: Internal Medicine

## 2013-06-18 ENCOUNTER — Other Ambulatory Visit (INDEPENDENT_AMBULATORY_CARE_PROVIDER_SITE_OTHER): Payer: Medicare Other

## 2013-06-18 ENCOUNTER — Telehealth (HOSPITAL_COMMUNITY): Payer: Self-pay | Admitting: Adult Health

## 2013-06-18 DIAGNOSIS — I10 Essential (primary) hypertension: Secondary | ICD-10-CM

## 2013-06-18 LAB — BASIC METABOLIC PANEL
BUN: 113 mg/dL (ref 6–23)
Chloride: 98 mEq/L (ref 96–112)
Creatinine, Ser: 4.2 mg/dL — ABNORMAL HIGH (ref 0.4–1.2)
GFR: 13.54 mL/min — CL (ref >60.00)
Glucose, Bld: 279 mg/dL — ABNORMAL HIGH (ref 70–99)
Potassium: 4 mEq/L (ref 3.5–5.1)

## 2013-06-18 LAB — ANGIOTENSIN CONVERTING ENZYME: Angiotensin-Converting Enzyme: 44 U/L (ref 8–52)

## 2013-06-18 NOTE — Telephone Encounter (Signed)
Call [name] regarding lab work.   Creatinine trending up 3.0>4.2   Instructed to hold torsemide 11/18 and 11/19. Resume 40 mg torsemide daily on 11/ 20/14.   [facility] to check BMET later this week.   [name] verbalized understanding.   CLEGG,AMY 2:56 PM

## 2013-06-19 ENCOUNTER — Encounter: Payer: Self-pay | Admitting: Pulmonary Disease

## 2013-06-19 ENCOUNTER — Ambulatory Visit (INDEPENDENT_AMBULATORY_CARE_PROVIDER_SITE_OTHER): Payer: Medicare Other | Admitting: Pulmonary Disease

## 2013-06-19 VITALS — BP 138/64 | HR 71 | Temp 97.5°F | Ht 63.0 in | Wt 224.4 lb

## 2013-06-19 DIAGNOSIS — N179 Acute kidney failure, unspecified: Secondary | ICD-10-CM

## 2013-06-19 DIAGNOSIS — N189 Chronic kidney disease, unspecified: Secondary | ICD-10-CM

## 2013-06-19 DIAGNOSIS — G4733 Obstructive sleep apnea (adult) (pediatric): Secondary | ICD-10-CM

## 2013-06-19 DIAGNOSIS — D869 Sarcoidosis, unspecified: Secondary | ICD-10-CM

## 2013-06-19 DIAGNOSIS — F411 Generalized anxiety disorder: Secondary | ICD-10-CM

## 2013-06-19 DIAGNOSIS — I5032 Chronic diastolic (congestive) heart failure: Secondary | ICD-10-CM

## 2013-06-19 DIAGNOSIS — E119 Type 2 diabetes mellitus without complications: Secondary | ICD-10-CM

## 2013-06-19 DIAGNOSIS — N058 Unspecified nephritic syndrome with other morphologic changes: Secondary | ICD-10-CM

## 2013-06-19 DIAGNOSIS — I10 Essential (primary) hypertension: Secondary | ICD-10-CM

## 2013-06-19 DIAGNOSIS — E1129 Type 2 diabetes mellitus with other diabetic kidney complication: Secondary | ICD-10-CM

## 2013-06-19 DIAGNOSIS — L408 Other psoriasis: Secondary | ICD-10-CM

## 2013-06-19 DIAGNOSIS — J45909 Unspecified asthma, uncomplicated: Secondary | ICD-10-CM

## 2013-06-19 DIAGNOSIS — E669 Obesity, unspecified: Secondary | ICD-10-CM

## 2013-06-19 MED ORDER — CLOBETASOL PROPIONATE 0.05 % EX CREA: 1.0000 "application " | TOPICAL_CREAM | Freq: Two times a day (BID) | CUTANEOUS | Status: DC

## 2013-06-19 MED ORDER — LORAZEPAM 1 MG PO TABS: ORAL_TABLET | ORAL | Status: DC

## 2013-06-19 MED ORDER — HYDROXYZINE HCL 25 MG PO TABS: 25.0000 mg | ORAL_TABLET | ORAL | Status: DC | PRN

## 2013-06-19 NOTE — Patient Instructions (Signed)
Today we updated your med list in our EPIC system...     Your serum calcium level is normal on the Prednisone 5mg  daily...    Let's decrease this further to 5mg  alternate w/ 2.5mg  every other day & stay on this until return...  We wrote a new prescription for the Clobetasol cream for your Psoriasis- apply twice daily...    We also wrote for Atarax (Hydroxyzine) 25mg  to take every 4h as needed for itching...  We refilled the Lorazepam 1mg  to take 1/2 to 1 tab up to 3 times daily as needed for anxiety...    You may try a dose in the eve to help you rest as we discussed...  Hopefully the adjustment in the diuretic therapy wil lead to improved renal function...  Ask DrALtheimer about the poss of switching the Levimir & Humalog ($$)=> to NPH & Regular (or combination 70/30 insulin)...  Call for any questions...  Let's plan a follow up visit in 63mo, sooner if needed for problems.Marland KitchenMarland Kitchen

## 2013-06-19 NOTE — Progress Notes (Addendum)
Subjective:    Patient ID: [name], female    DOB: 11/08/1945, 67 y.o.   MRN: [id number]  HPI 67 y/o BF here for a follow up visit... he has multiple medical problems as noted below...    ~  April 28, 2012:  59moROV & last visit we decreased the Pred to 564md (on this for hypercalcemia from Sarcoid) & decreased the Lasix to 2034m (due to RI w/ Cr=2.2);  Follow up labs today showed Ca=9.1 and we decided to decr the Pred further to 5mg50md;  In addition her BUN=40, Creat=2.1, BNP=196 (ok to continue Lasix20); she understands that if her Creat starts to rise further that we will have to send her to Nephrology...    She saw DrAltheimer for f/u 9/13> note reviewed & he adjusted her insulin regimen- Levemir 80uQam & Apidra 20-34-30 + SS adjustments...    She fell 8/30 & hit her head> went to ER & DrLockwood's note is reviewed- c/o headache & left knee pain; Exam showed forehead hematoma & CTBrain showed left frontal scalp hematoma, no fx, mild cortical atrophy, mild carotid atherosclerosis, NAD;  Maxillofacial CT was neg w/o facial bone fx;  Left knee prosthesis & no acute changes...    She reports that she just had an ESI injection from DrRamos for LBP & she is improved...    [name], meds, xrays and labs> see below for updates >> LABS 9/13:  [place]- BS=234, Ca=9.1, BUN=40, Creat=2.1, BNP=196   ~  June 21, 2012:  63mo 65mo& Maghen went to the ER 06/14/12 w/ c/o SOB> she had developed a sore throat, wheezing, cough, & SOB; she had taken ZPak, Delsym, Proair which helped somewhat;  CXR showed cardiomeg, pulm vasc congestion, DJD in TSpine, NAD;  ER treated w/ Pred boost to 40mg/67m NEB Rx which really helped;  She notes stuff head & some drainage, denies GI/ reflux symptoms;  She is obese, cushingoid, and we were in the process of weaning off the Pred when it was boosted up by ER- we gave her a NEB Rx & this really helped;  Placed on O2 by nasal cannula for hypoxemia- 84% on  RA;  Decision made to wean Pred down to 5mgQod19m before, continue Advair250Bid, add NEBS Qid w/ Albut vs Proair, plus her Mucinex/ Fluids/ etc...    She continues her regular f/u appts w/ DrAltheimer, Endocrine> on Levemir, Apidra, Amaryl, Januvia; last A1c=8.1 is Sep2013...    [name], meds, xrays and labs> see below for updates >>   ~  October 25, 2012:  24mo ROV8moost hospital visit> she was Hosp by [facility]> presented w/ weakness, difficulty ambulating, sl confusion w/ memory loss; she had been to DrAltheimer's office & labs showed Ca>14 w/ Creat>4;  Hypercalcemia related to Sarcoidosis- treated w/ IVF, Lasix, Calcitonin, & Solumedrol- ACE level=80;  She was disch on Pred20Bid but we will need to wean this quickly due to her DM;  She had acute renal failure as well w/ Creat>4 and after hydration she was disch w/ Creat ~2;  DM followed & treated by DrAltheimer;  DCSummary reviewed in detail>>    Sarcoidosis w/ HYPERCALCEMIA> Hypercalcemia returned when Pred was cut to 5mg Qod;59mw back on Pred20Bid post hosp & Ca=10.6; we discussed wean to 20mg/d w/32m & recheck Ca level in 3 weeks...     HBP> on Metop25- 1/2, Norvasc10, Apres10Tid, Clonidine0.1Bid, Lasix20, K10-2/d; BP= 160/72 &  she denies CP, palpit, dizzy, ch in SOB/ DOE, edema, etc...    Cardiac Arrhythmia> she has WCT & SBrady- eval by DrAllred for Cards & meds adjusted...    CHOL> on Lip20; last FLP was 4/12- looked good & we reviewed low fat diet restrictions...    DM on Insulin> followed by DrA on [place] coverage at meals; BS at home varies 150-400+, labs done Q370moby DrAltheimer...    Obesity> wt= 203# (63"Tall & BMI=37) and we reviewed diet, exercise, & wt reduction strategies...    GI> GERD on NexiumBid; and we reviewed antireflux regimen (elev HOB, npo after dinner, etc)...    Renal Insuffic> Creat has returned to 2.08 now; advised incr free water intake...    Anxiety> on Alprazolam prn; she's been  under extra stress w/ loss of job, this illness, family issues... [name], meds, xrays and labs> see below for updates >>   ~  November 08, 2012:  2week ROV & she has been stable on Pred244md; still tired, notes DOE w/ walking; BMet today shows K=5.1, BUN=51, Cr=2.1, Ca=9.4; we discussed decr Pred to 1030m, incr free water intake, continue Lasix20; ROV 569mo9mo   Meds reviewed> HBP controlled on her 5med45mChol is ok on Lip20; DM regulated by DrAltheimer on [place] unfortunately up to 205# & we reviewed diet/ exercise/ etc; she is coping well w/ her serious medical problems...  ~  Dec 19, 2012:  6wk ROV & recheck after her last visit w/ stable numbers on Pred20mg/54me decided to decr to 10mg/d4mollow up at this time to recheck her Calcium etc; unfortunately she made a mistake & has continued the Pred at the 20mg/d 58m since her last visit... Feeling OK overall, her wt is up 3# to 207# today, BP= 138/72, and she recently had f/u DrAltheimer to recheck her DM (insulin adjusted)...  We reviewed her medication record & her procedures and instructed her to decr the Pred to 10mg/d a46mis point... We wil  Recheck pt in 6-8weeks w/ BMet at that time...  ~  February 19, 2013:  70mo ROV &770mon last seen Sukanya cut her Pred to 10mg/d> sh70mes DrAltheimer frequently w/ extensive lab work done at each visit; last labs avail to us 6/14 shoKoread Calcium=9.6, BUN=36, Cr=1.86, A1c=8.5; We decided to continue Pred taper & she is instructed to decr the Pred to 05-06-09-5 Qod w/ ROV planned 69mo...  She470mo Home O2 for her Sarcoid, chronic hypoxemic resp failure, DiastolicCHF> we did Ambulatory O2 check today>  O2sats on RA:  Rest= 94% w/ pulse72;  After 3laps= 82% w/ pulse=90...     OSA on CPAP but only using 1-2d/wk    BP= 122/80 on her cardiac meds> Metop, Amlod, Hydral, Clonid, Lasix40; she denies CP, palpit, dizzy/ syncope, SOB, edema...    DM treated by DrA w/ Levemir 78uAM-0uPM, Apidra  18-30-26, Glimep4, Januv50; he [id number] to check her every month w/ extensive labs & his notes reviewed...    Despite diet etc her weight is up to 214# & we reviewed wt reduction strategy...    Renal insuffic w/ Cr ~2 chronically [name], meds, xrays and labs> see below for updates >>   ~  April 16, 2013:  70mo ROV & po69moosp check> Sakara was Adm by [facility] 9/1 - 04/10/13 w/ acute on chronic diastolic CHF when she presented w/ increased SOB & edema; BNP was 2600, CXR  showed vasc congestion, 2DEcho w/ EF=60-65% & Gr2DD, Creat= 2.5 but increased to 4.1 w/ diuresis; that forced them to replace some fluids, she was disch on Demadex40...   Since disch she has been back to see DrAltheimer 9/12 w/ labs showing BS=255, A1c=8.1, BUN=73, Cr=2.94 and she has an upcoming appts w/ Nephrology & the CHF clinic soon...  Of note the Hospitalist team held her Pred during the hosp & didn't restart after disch- recall hx Sarcoidosis w/ primarily severe hypercalcemia as manifestation, prev splenectomy w/ transient improvement then recurrent hypercalcemia & Rx w/ Pred w/ nice response but calcium bumped up to >14 when Pred weaned to 656m Qod in early 2014; we have slowly diminished her dose back to 10-5 Qod when last seen & review of labs showed Calcium levels all wnl in hosp and measured 10.1 in DrAltheimer's office 9/11... We discussed options here & we decided to RESTART PRED 10-5 QOD for the next few weeks (she is feeling weak since off the Pred) then taper to 520md til return o\in [id number] we will check BMet & ACE level at that time...  ~  June 19, 2013:  56m48moV & Lianette continues to struggle w/ her multisystem disease>> from the Pulm standpoint her breathing is sl better & she is using Pred5mg51m Advair250Bid, ProairHFA as needed; her calcium level remains wnl as we have weaned the Pred (Ca=9.5 on labs yest); we discussed coming down 1/2 step to 5mg 34mernate w/ 2.5mg Q66m& we will recheck her in  56mo...30mo CARDS>>  On Metoprolol25-1/2Bid, Bidil20-37.5Tid, Demadex20-2/d;  She had f/u w/ DrMcLean 10/14> 96/78>hr diastolicCHF; sl improved since hosp 9/14 where Echo showed EF60-65%, Gr2DD, mildAS, RV normal; now on Demadex20-2Qam but labs yest showed BUN=113, Cr=4.2 (weight was up recently & they increased her to 40mgBid75m several days, now asked to hold Demadex for 2d & resume 40mg Qam55mer that w/ f/u labs); she needs f/u w/ Nephrology- DrSanford...     DM>>  Endocrine is managed by DrAltheimer on Levemir 50uBid and Apidra=> switched to Humalog (taking 18-30-26) but both are too expensive in the donut hole & she cannot afford her meds; asked to discuss w/ drAlt the poss of switch to NPH or poss 70/30 insulin to save money etc...     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     RENAL>>  She saw DrSanford 9/14> I have reviewed his perspicacious note & discussed w/ him; we have been able to wean her Pred down to 5mg/d and21mr hypercalcemia is remaining under control (there is no data on treatment for this manifestation w/ alternative meds); her CKD is most likely due to her DM & HBP, also has MGUS & the sarcoid (only clinical manifestation has been the hypercalcemia & lymphoreticular granulomatosis); NOTE: Cr prev 2 range, then 2-3 range & now 3-4 range since Cards increased diuretics for her diastolicCHF (hopefully Cr will improve somewhat as she's been on incr diuretics, water restriction, & had the gastroenteritis)...     PSORIASIS>> she sees DrTafeen for Derm; asking for refill Clobetasol cream & Atarax25mg prn i36mng [name], meds, xrays and labs> see below for updates >>  LABS 11/14 showed BS=279, BUN=113, Cr=4.2... Cards haMarland KitchenMarland Kitchenresponded w/ transient decr in diuretic dose, she needs f/u  w/  Renal...  ADDENDUM>>  Full PFTs done 08/08/13 prior to getting her enrolled into PulmRehab program>> FVC=1.50 (65%), FEV1=1.23 (69%), %1sec=82, no improvement in FEV1 post bronchdilator, Lung volumes are mild to mod restricted, DLCO is reduced but normalizes after correction for [place]...         Problem List:    OBSTRUCTIVE SLEEP APNEA - Sleep Study 11/07 showed RDI=21 w/ desat to 62% during REM.Marland Kitchen. eval by DrSood w/ Rx for CPAP 12... compliance poor- only using it 1-2 times/wk... encouraged to use CPAP more regularly & to f/u w/ DrSood regarding the mask interface problems... ~  8/11:  She reports new mask but still not using CPAP regularly, "I rest fairly well". ~  7/12:  She reports not using her CPAP, and not resting well... ~  1/13:  CPAP used briefly during [facility] for diastolic CHF, but still not compliant w/ home use... ~  7/14:  OSA on CPAP but only using 1-2d/wk... we did Ambulatory O2 check today>  O2sats on RA:  Rest= 94% w/ pulse72;  After 3laps= 82% w/ pulse=90...  ~  11/14: encouraged to use the CPAP more regularly...  BRONCHITIS, ACUTE - on ADVAIR 250Bid (using Prn only) & PROAIR as needed. 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 ~  CXR 1/09 was pre-op for TKR- sl cardomeg & ?mild vasc congestion. ~  CXR 2/11 showed norm heart size & vascularity, clear, s/p cholecystectomy, DJD in TSpine. ~  CXR & CT Chest 11/11 showed sl peribronch thickening, no lung lesions, no signif adenopathy. ~  PET scan 1/12 showed hypermetabolic activ in spleen & lymph nodes in the supraclav, hilar, mediastinal, periaotic & iliac region as well. ~  CXRs 3/12 in the perioperative period after splenectomy showed left effusion==> tapped & improved aeration. ~  CXR 4/12 back to baseline w/ clear bases, essent wnl... ~  CXR 7/12 showed norm heart size, clear lungs, no definite adenopathy, NAD... ~  LABS 7/12 w/ ACE=78 (8-52), Ca=12.0, AlkPhos=176 (39-117),  GGT=151 (7-51) ~  Labs from DrAltheimer> 8/12: Ca=12.4, Creat=1.8; then 9/12: Ca=13.4, Creat=2.3;  ~  10/12:  PREDNISONE 20mg /d started 10/12 & 3wks later Ca=10.3, Creat=1.8; therefore weaned to 20-10 Qod. ~  11/12: Labs on Pred 20-10 Qod showed Ca++= 9.8; we will continue to wean Pred. ~  1/13:  CXR showed cardiomeg, mild interstitial edema, DJD spine; Labs showed Ca=9.7 & we decided to wean Pred to 5mg /d... ~  3/13:  Labs showed Ca=10.1 on Pred 5mg /d; decided to wean to 5mg Qod... ~  6/13:  Labs showed Ca=11.0, ACE=86; on Pred5Qod & rec to incr back to 10mg /d... ~  8/13:  CXR showed cardiomeg, ?mild interstitial edema, mild DJD TSpine... ~  Labs 8/13 showed Ca=10.0 on Pred10/d; rec to decr further to 5mg /d... ~  Labs 9/13 showed Ca= 9.1 on Pred5mg /d & she will try to decr to 5Qod again... ~  11/13: she recently went to ER w/ incr SOB & was placed on Pred40/d; we are adding NEBS w/ ALBUT2.5mg Qid & wean Pred back to 5mg Qod as before. ~  3/14: she was hosp w/ Ca>14 (on the Pred5mg Qod); treated w/ IVF, Lasix, Calcitonin & incr steroids; Disch on Pred20Bid; we will wean Pred to 20mg /d & f/u in 3wk. ~  4/14:  On Pred20/d & calcium improved to 9.4; we will decr to Pred10mg /d & f/u 75mo... ~  7/14:  On Pred10mg /d & calcium remains well controlled at 9.6 and we decided to decr Pred to  05-06-09-5 Qod... ~  9/14:  Hosp for acute on chr diastolic CHF & [facility] stopped her Pred; Calcium levels in hosp were 9.6-10.1 and recent labs by DrAltheimer showed Ca=10.1; REC- restart Pred 10-5 QOD & try to wean to 5mg /d w/ f/u labs in 12mo... ~  CXR 9/14 showed mild cardiomeg (stable), mild vasc congestion, sl bronch wall thickening & mild elev right hemidiaph (no changes)... ~  11/14: Labs shows Ca=9.5 on Pred5mg  daily; Rec to decr further to 5mg  alt w/ 2.5mg  QOD...  HYPERTENSION >> on numerous meds w/ med changes over time >> she is asked to bring all med bottles to the visits but never does! ~  1/13: Post Hosp BP= 146/70  on ? - Metop12.5, Amlod10, Clonid0.1Bid, Lasix20; she denies CP, palpit, SOB, edema> improved from recent hosp... ~  3/13: She called in the interim for "refill" Apresoline not prev on any med list; BP= 150/80 on all 5 meds; must elim sodium & get wt down... ~  6/13:  BP= 142/80 on but BUN-41 Creat=2.3 BNP=104; rec to stop Lasix for now, f/u BMet 6wks. ~  8/13:  BP= 140/70 on same meds & BUN=30, Creat=1.6, but BNP incr to 2960 & LASIX 40mg /d restarted... ~  9/13:  Follow up labs showed BUN=45, Creat=2.2 on Lasix40 and we rec decr to Lasix20... ~  9/13:  BP= 122/74 on w/ BUN=40, Creat=2.1, & BNP=196... Continue same. ~  11/13:  BP= 142/68 on same ... ~  3/14:  BP= 160/72 on same 5 meds for now... ~  4/14:  BP= 128/70 on her regimen...  ~  7/14:  BP= 122/80 on her 5 cardiac meds> Metop, Amlod, Hydral, Clonid, Lasix40; she denies CP, palpit, dizzy/ syncope, SOB, edema... ~  9/14:  She was hosp w/ acute on chronic diastolic CHF & meds changed; disch on Metoprolol25-1/2, Bidil 20-37.5Tid, Demadex20-2/d; BP= 128/70 but recent labs by DrA showed BUN=73, Cr=2.94 & she has f/u w/ Neph & CHF clinic... ~  11/14: on same meds & BP= 138/64...  CAD, CHRONIC DIASTOLIC CHF,  & CARDIAC ARRHYTHMIA >> on ASA 81mg /d...  ~  Hx non-obstructive CAD w/ cath 6/07 by DrStuckey showing luminal irregularities & tiny first marginal branch of the CIRC w/ ostial lesion... good LVF. ~  2DEcho 9/07 showed mildly calcif AoV and normal LVF & wall motion. ~  2DEcho 11/11 showed norm LV wall thickness, norm LVF w/ EF= 60-65%, norm atria, norm valves, trivial peric fluid behind heart. ~  2DEcho 1/13 showed mild LVH, norm LVF w/ EF=55-60% & no regional wall motion abn, Gr2DD, norm RV... ~  3/14: In hosp for hypercalcemia etc> she had cardiac arrhythmia w/ WCT & SBrady- to be f/u DrAllred... ~  5/14: she had f/u DrAllred> hosp f/u for WCT (atrial tachy per EP), pt asymptomatic- denies CP, palpit, SOB, dizzy/  syncope, edema, etc; no further w/u planned, f/u prn... ~  7/14: she had f/u DrWall> chr diastolicCHF, nonobstructiveCAD, mildMR- stable, no changes made, f/u 45yr... ~  9/14:  She was adm by [facility] w/ acute on chr diastolicCHF (BNP=2600, CXR w/ vasc congestion, 2DEcho w/ EF60-65% & Gr2DD); meds adjusted & diuresed but Creat incr to 4.1; eventually disch on Demadex40 & Cr= 3.3=>2.9; she has appt in CHF clinic... ~  11/14: she has been actively followed by CHF clinic & DrMcLean w/ med adjustments...  CEREBROVASCULAR DISEASE - she remains on ASA 81mg /d without TIA's or other neuro manifestations...  ~  MRA Br 9/07 showed mod  intracranial atherosclerotic changes...   HYPERLIPIDEMIA - on LIPITOR 20mg /d... FLP monitored at each OV by DrAltheimer... ~  FLP 7/07 showed TChol 114, TG 85, HDL 28, LDL 69 ~  FLP 4/09 showed TChol 154, TG 192, HDL 34, LDL 82... discussed poss of adding fibrate- hold... ~  FLP 2/10 on Lip20 showed TChol 159, TG 207, HDL 29, LDL 83... rec> add Fenoglide (she didn't). ~  FLP 2/11 on Lip20 showed TChol 185, TG 242, HDL 43, LDL 99... add FENOFIBRATE 160mg /d... ~  FLP 8/11 on Lip20+Feno160 showed TChol 167, TG 216, HDL 37, LDL 96... she stopped Feno160. ~  FLP 4/12 on Lip20 showed TChol 146, TG 115, HDL 35, LDL 88 ~  FLP 2/13 on Lip20 showed TChol 159, TG 176, HDL 41, LDL 86 ~  LABS checked by DrAltheimer  DM - on LEVEMIR & APIDRA SS coverage now per DrAltheimer; plus prev on GLIMEPIRIDE 4mg /d and JANUVIA 50mg /d. ~  labs 4/08 showed BS=138 & HgA1c=7.5.Marland Kitchen. home BS = 100 to >300... misses doses and not on diet or exercising... ~  1/09 became hypoglycemic in hosp on 4 meds post knee surg- meds adjusted... ~  3/09 restarted GLIMEPIRIDE 4mg - 1/2 tab each AM, & continue Lantus & Metformin... ~  labs 4/09 showed BS= 148, HgA1c= 7.3.Marland Kitchen. rec> back on 4 med regimen due to poor control at home. ~  6/09 discussed titrating the Lantus up until FBS 100-120 range... ~  labs 2/10 showed BS= 164,  HgA1c= 8.6.Marland KitchenMarland Kitchen very disappointing- needs home BS monitoring, incr Lantus. ~  labs 2/11 showed BS= 298, A1c= 8.4...  discussed change Lantus to LEVEMIR 40 u daily... ~  labs 8/11 showed BS= 202, A1c= 10.9.Marland Kitchen. rec> incr Levemir to 50, consider Humalog. ~  Nov-Dec 2011:  BS up w/ adjust of meds & addition of Pred after hosp 11/11 & hypercalcemia... ~  She states she returned to Levemir 40u daily after the splenectomy hosp due to appetite etc;  4/12 BS=117 ~  Labs 7/12 showed BS= 167, A1c= 8.4> on Levemir 50u/d & Amaryl 4mg  Qam (note creat 1.9). ~  8-10/12: on Levemir30 + ApidraSS per DrA & BS are 150-400+ due to the Pred20; A1c 8/12 was 7.6 ~  11/12:  DrA has increased her Levemir to 46u/d & the Apidra to 10+SS cover Tid... BS here= 168 ~  1/13: Covered w/ SSI in Hosp but A1c=9.3 (Creat up to 2.4 but improved to 1.7 w/ adjust meds)... ~  DrAltheimer's notes indicates continued adjustments in her insulin regimen w/ Levemir & Apidra... ~  9/14:  He prev oral meds were stopped & currently taking Levemir40Bid & Apidra incr to 24-34-30 by DrA; recent BS=255, A1c=8.1 ~  11/14:  meds adjusted, on Levemir 50uBid and Apidra=> switched to Humalog (taking 18-30-26) but both are too expensive in the donut hole & she cannot afford her meds; asked to discuss options w/ DrAltheimer.  OBESITY (ICD-278.00) - weight down to 171 after hosp 11/11- doing better on diet, not yet exercising. ~  weight 220-230# in the early 1990's... ~  weight 210-220# in the early 2000's... ~  weight 2/10 = 192# ~  weight 2/11 = 186# ~  weight 8/11 = 174# ~  weight 12/11 = 171# ~  Weight 4/12 = 168# ~  Weight 7/12 = 171# ~  Weight 11/12 = 188# ~  Weight 1/13 = 196# ~  Weight 3/13 = 205#... She MUST get back on diet, incr exercise, get wt down. ~  Weight 6/13 =  205# ~  Weight 8/13 = 210# ~  Weight 9/13 = 220#... What happened? ~  Weight 11/13 = 225# and BMI= 40 ~  Weight 3/14 = 203# ~  Weight 7/14 = 214# ~  Weight 9/14 = 216# ~   Weight 11/14 = 224# and we reviewed diet, exercise & wt reduction strategies...  HYPERCALCEMIA> this was most likely due to sarcoidosis w/ extensive splenic involvement;  Calcium was as hight as 13.7 and returned to normal after the splenectomy. ~  Labs 4/12 showed calcium = 11.1 & this is very disappointing... ~  Labs 7/12 showed Ca= 12.0, Phos= 4.0, ACE= 78 ~  Labs 8-10/12 showed Ca up to 13.4, then down to 10.3 on PRED20mg /d... ~  Labs 11/12 showed Ca= 9.8 on Pred 20-10 qod; we will continue to wean. ~  Labs 1/13 in [place] showed Ca= 9.7 range on Pred 10-5 Qod;  We decided to wean Pred to 5mg /d... ~  Labs 3/13 showed Ca=10.1 on Pred5mg /d; rec to decr to 5mg Qod... ~  Labs 6/13 showed Ca=11.0 on Pred5Qod; rec incr to 10mg /d... ~  Labs 8/13 showed Ca=10.0 on Pred10/d; rec to decr to 5mg /d... ~  Labs 9/13 showed Ca=9.1 on Pred5mg /d; rec to decr to 5mg Qod... ~  Labs 12/13 showed Ca= 9.2 to 10.5 on Pred 5mg Qod... ~  Labs 3/14 in hosp showed Ca= 14.5 & disch on Pred20Bid; 3/14 f/u Ca=10.6 & Pred decr to 20mg /d... ~  4/14: on Pred20/d and f/u labs showed Ca= 9.4... Rec to decr Pred to 10mg /d & f/u 71mo... ~  7/14: on Pred10/d and most recent Ca= 9.6.Marland KitchenMarland Kitchen rec to decr Pred to 05-06-09-5 Qod... ~  9/14: Pred was stopped by [facility] duringh 9/14 hosp & serial calcium levels reviewed- 9.6=>10.1; we decided to restart her Pred10-5 QOD for now & try to wean to 5mg /d by f/u in 18mo w/ repeat labs... ~  11/14: on Pred5mg /d and Ca=9.5; rec to decr to 5mg  alternate w/ 2.5mg  QOD...  GERD - on NEXIUM 40mg  Bid... w/ increased reflux symptoms w/ noct cough etc...  ~  2/10: discussed optimal Rx w/ Nex before dinner, Zantac300 + Reglan10 at bed, elev HOB, etc. ~  2/11: she stopped the Reglan, still using Nexium/ Zantac but w/ persist symptoms>  refer to GI for eval. ~  6/11: GI f/u DrPerry w/ EGD that was normal... continue Rx. ~  12/11:  increased GI symptoms post hosp> she will f/u w/ DrPerry for his input> incr Nexium  Bid. ~  She remains on Nexium 40mg Bid regularly...  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 ~  6/11:  f/u colonoscopy by DrPerry showed divertics, 4 polyps, AVM.Marland KitchenMarland Kitchen path= tubular adenoma, f/u 60yrs. ~  9/14: she describes abd cramps, sick feeling when she has to go- better after BM, takes Senakot-S and Bentyl10Qid prn...  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 ~  Note: Serum Calcium ret to normal immediately after spleen removed, but hypercalcemia returned after several months forcing [place] to start Rx w/ Pred...  RENAL INSUFFICIENCY (ICD-588.9) >> Creat ~ 2.0 & eval 10/11 by DrFox- prob due to DM & HBP... ~  Labs 4/12 showed BUN= 27, Creat= 1.6 ~  Labs 7/12 showed BUN= 44, Creat= 1.9 (not on diuretics/ NSAIDs/ etc)... ~  Labs showed Creat up to 2.3 when Ca=13.4;  now improved to 1.8 w/ Ca coming down... ~  Labs 11/12 showed BUN=38, Creat=1.7; rec to maintain hydration... ~  Labs 1/13 in [place] w/ diuresis showed Creat incr to 2.4 but ret to 1.7 w/ adjustment in meds... ~  Labs 3/13 showed BUN=38, Creat= 1.6 ~  Labs 6/13 showed BUN=41, Creat= 2.3; rec> stop Lasix20 for now, liberalize fluids. ~  Labs 9/13 showed BUN=40, Creat=2.1 on Lasix20, rec to continue the same. ~  Labs 12/13 showed Creat= 1.5 ~  Labs 3/14 in hosp showed Creat=4, then decr to 2.00 by dischage... ~  Labs 4/14 showed BUN=51, Creat=2.1, and rec to incr free water as we wean the Pred... 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 ~  11/14: Labs are worse w/ BUN=113, Creat=4.2 after recent gastroent 7 diuretics  increased by Cards for wt gain; they have adjusted down & she needs Renal follow up...  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 on LYRICA 100mg  Qhs, off prev Tramadol Rx... ~  2010 eval by DrHiatt @ [facility] on Tramadol 50mg , Lyrica 75mg Bid...  GOUT, UNSPECIFIED (ICD-274.9) - Uric in the 7-8 range...on ALLOPURINOL 300mg /d for prevention...  ANXIETY (ICD-300.00) - she is under mod stress- work, mother died, hospice counselling, etc... ~  8/11:  rec starting Alprazolam 0.5mg  Tid for palpit, SOB, anxiety...  ANEMIA-NOS & MGUS >> see eval 10-11/11 by DrGranfortuna w/ bone marrow 11/11 in hosp= neg. ~  Labs 4/12 showed Hg= 11.6, MCV= 84... On FeSO4 daily. ~  Labs 7/12 showed Hg= 12.8, MCV= 90, Fe= 45 (14%sat)... ~  Labs 1/13 in [place] showed Hg=10.4 but improved to 12.9 on post hosp check in office... ~  Labs 6/13 showed Hg= 12.4 ~  Labs 12/13 showed Hg= 13.9 ~  Labs 3/14 showed Hg= 12.9 ~  Labs 9/14 during hosp showed Hg= 10-11 range...  PSORIASIS - eval and Rx per DrTafeen prev on Humira injections- stopped 9/11...   Past Surgical History  Procedure Laterality Date  . Vesicovaginal fistula closure w/  total abdominal hysterectomy  1992  . Bilateral total knee replacements Bilateral Rt=5/04 & Lft=1/09    by DrAlusio  . 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 Abbey Chatters  . Appendectomy  1957  . Tonsillectomy  1968  . Cholecystectomy  1980's  . Abdominal hysterectomy  1992  . Reduction mammaplasty Bilateral 1980's  . Cardiac catheterization  1990's  . Thoracentesis  2012    Outpatient Encounter Prescriptions as of 06/19/2013  Medication Sig  . albuterol (PROVENTIL HFA;VENTOLIN HFA) 108 (90 BASE) MCG/ACT inhaler Inhale 2 puffs into the lungs every 6 (six) hours as needed for wheezing or shortness of breath.  . allopurinol (ZYLOPRIM) 100 MG tablet Take 100 mg by mouth daily.  . Alum & Mag Hydroxide-Simeth (MAGIC MOUTHWASH) SOLN  1 tsp 4 times daily gargle and swallow  . aspirin EC 81 MG tablet Take 81 mg by mouth daily.  Marland Kitchen atorvastatin (LIPITOR) 20 MG tablet Take 20 mg by mouth daily.  Marland Kitchen azithromycin (ZITHROMAX Z-PAK) 250 MG tablet Take as directed  . dicyclomine (BENTYL) 10 MG capsule Take 10 mg by mouth 4 (four) times daily -  before meals and at bedtime.  Marland Kitchen esomeprazole (NEXIUM) 40 MG capsule  Take 1 capsule (40 mg total) by mouth 2 (two) times daily.  . ferrous sulfate 325 (65 FE) MG tablet Take 325 mg by mouth daily with breakfast.  . fluticasone (FLONASE) 50 MCG/ACT nasal spray Place 2 sprays into the nose at bedtime.  . Fluticasone-Salmeterol (ADVAIR) 250-50 MCG/DOSE AEPB Inhale 1 puff into the lungs 2 (two) times daily.  Marland Kitchen HYDROcodone-acetaminophen (NORCO/VICODIN) 5-325 MG per tablet Take 2 tablets by mouth every 4 (four) hours as needed for pain.  Marland Kitchen insulin detemir (LEVEMIR) 100 UNIT/ML injection Inject 50 Units into the skin 2 (two) times daily.  . insulin glulisine (APIDRA SOLOSTAR) 100 UNIT/ML injection Use 26 units before breakfast and 36 units before lunch and 32  units before dinner  . isosorbide-hydrALAZINE (BIDIL) 20-37.5 MG per tablet Take 1 tablet by mouth 3 (three) times daily.  Marland Kitchen ketorolac (ACULAR LS) 0.4 % SOLN Place 1 drop into both eyes 3 (three) times daily.   . Lancets MISC 1 Units by Does not apply route 4 (four) times daily - after meals and at bedtime.  . Linaclotide (LINZESS) 290 MCG CAPS capsule Take 1 capsule (290 mcg total) by mouth daily.  . Linaclotide (LINZESS) 290 MCG CAPS capsule Take 1 capsule (290 mcg total) by mouth daily.  Marland Kitchen LORazepam (ATIVAN) 1 MG tablet Take 1 mg by mouth daily as needed.   . metoprolol tartrate (LOPRESSOR) 25 MG tablet Take 0.5 tablets (12.5 mg total) by mouth 2 (two) times daily.  . predniSONE (DELTASONE) 10 MG tablet 10 mg ~ 5 mg every other day  . pregabalin (LYRICA) 100 MG capsule Take 1 capsule (100 mg total) by mouth every evening.  . sennosides-docusate  sodium (SENOKOT-S) 8.6-50 MG tablet Take 1 tablet by mouth at bedtime.  . torsemide (DEMADEX) 20 MG tablet Take 2 tablets (40 mg total) by mouth daily.    Allergies  Allergen Reactions  . Adhesive [Tape] Other (See Comments)    Blisters   . Avelox [Moxifloxacin Hcl In Nacl]     GI upset  . Codeine Other (See Comments)    "crazy"   . Guaifenesin Nausea And Vomiting  . Latex Swelling  . Oxycodone Nausea And Vomiting    Current Medications, Allergies, Past Medical History, Past Surgical History, Family History, and Social History were reviewed in [employer] record.   Review of Systems         See HPI - all other systems neg except as noted... The patient complains of dyspnea on exertion.  The patient denies anorexia, fever, weight loss, weight gain, vision loss, decreased hearing, hoarseness, chest pain, syncope, peripheral edema, prolonged cough, headaches, hemoptysis, abdominal pain, melena, hematochezia, severe indigestion/heartburn, hematuria, incontinence, muscle weakness, suspicious skin lesions, transient blindness, difficulty walking, depression, unusual weight change, abnormal bleeding, enlarged lymph nodes, and angioedema.     Objective:   Physical Exam      WD, Overweight, 67 y/o BF in NAD...  GENERAL:  Alert & oriented; pleasant & cooperative... HEENT:  [place]/AT, EOM-wnl, PERRLA, EACs-clear, TMs-wnl, NOSE-clear, THROAT- clear & wnl... NECK:  Supple w/ fair ROM; no JVD; normal carotid impulses w/o bruits; palp thyroid, w/o nodules felt; no lymphadenopathy. CHEST:  Clear to P & A; without wheezes/ rales/ or rhonchi. HEART:  Regular Rhythm; gr 1/6 SEM, without rubs/ or gallops. ABDOMEN:  Scar from splenectomy surg; obese, soft, & nontender w/ panniculus; normal bowel sounds; no organomegaly or masses detected EXT: without deformities, mod arthritic changes, s/p bilat TKRs; no varicose veins/ +  venous insuffic/ tr edema. NEURO:  CN's intact; no focal  neuro deficits... DERM:  Psoriasis rash per DrTafeen...  RADIOLOGY DATA:  Reviewed in the EPIC EMR & discussed w/ the patient...  LABORATORY DATA:  Reviewed in the EPIC EMR & discussed w/ the patient...   Assessment & Plan:    SARCOIDOSIS>  Main manifestation has been her hypercalcemia> she has proven that when her Pred is weaned to 5mg Qod the hypercalcemia returns; we are trying to determine her lowest dose threshold & Ca is now 9.5 on Pred5mg /d; we discussed slow taper to 5mg  alternate w/ 2.5mg  QOD...  Hypercalcemia>  Initially responded to splenectomy but her calcium level rose again over several months after the surg; we were forced to use Pred rx in this difficult diabetic; we had her see DrAltheimer for DM/ endocrine consult; calcium levels have responded nicely to Pred Rx but climbed back up when Pred was weaned to 5mg Qod early in 2014 (see above)...    ASTHMATIC BRONCHITS>  ER 11/13 bumped her Pred to 40mg /d which negated our slow taper w/ monitoring of her Calcium levels; NEB Rx w/ albut seemed to give the the max benefit & we need to wean down the Pred as much as poss; she is also hypoxemic w/ O2sat=84% on RA (likely from V/Q mismatching);  We decided to Rx w/ HomeO2- and she improved over time; now on Advair250bid & ProairHFA as needed, plus the Predmg used for her hypercalcemia...  HBP>  Meds were adjusted 9/14 hosp and BP is current wnl on her on Metoprolol25-1/2, Bidil 20-37.5Tid, Demadex20-2/d since disch 9/14; she has f/u CHF clinic...  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  DM>  Control has been difficult;  Stressed diet/ exercise, & get wt down; DrA has her on Levemir & HumalogSS coverage...  Renal Insuffic>  Creat was up to 4 in [place] again & then back to 2.9 w/ adjust in meds; now back to 4.2 w/ incr diuretics per Cards; she will f/u w/ DrSanford for Nephrology...  Other medical problems as noted...   Patient's  Medications  New Prescriptions   CLOBETASOL CREAM (TEMOVATE) 0.05 %    Apply 1 application topically 2 (two) times daily.   HYDROXYZINE (ATARAX/VISTARIL) 25 MG TABLET    Take 1 tablet (25 mg total) by mouth every 4 (four) hours as needed.  Previous Medications   ALBUTEROL (PROVENTIL HFA;VENTOLIN HFA) 108 (90 BASE) MCG/ACT INHALER    Inhale 2 puffs into the lungs every 6 (six) hours as needed for wheezing or shortness of breath.   ALUM & MAG HYDROXIDE-SIMETH (MAGIC MOUTHWASH) SOLN    1 tsp 4 times daily gargle and swallow   ASPIRIN EC 81 MG TABLET    Take 81 mg by mouth daily.   AZITHROMYCIN (ZITHROMAX Z-PAK) 250 MG TABLET    Take as directed   DICYCLOMINE (BENTYL) 10 MG CAPSULE    Take 10 mg by mouth 4 (four) times daily -  before meals and at bedtime.   ESOMEPRAZOLE (NEXIUM) 40 MG CAPSULE    Take 1 capsule (40 mg total) by mouth 2 (two) times daily.   FERROUS SULFATE 325 (65 FE) MG TABLET    Take 325 mg by mouth daily with breakfast.   FLUTICASONE (FLONASE) 50 MCG/ACT NASAL SPRAY    Place 2 sprays into the nose at bedtime.   FLUTICASONE-SALMETEROL (ADVAIR) 250-50 MCG/DOSE AEPB    Inhale 1 puff into the  lungs 2 (two) times daily.   HYDROCODONE-ACETAMINOPHEN (NORCO/VICODIN) 5-325 MG PER TABLET    Take 2 tablets by mouth every 4 (four) hours as needed for pain.   INSULIN DETEMIR (LEVEMIR) 100 UNIT/ML INJECTION    Inject 50 Units into the skin 2 (two) times daily.   INSULIN LISPRO (HUMALOG KWIKPEN) 100 UNIT/ML SOPN    Take 18 units in the morning, 30 units in the afternoon and 26 units in the evening.   ISOSORBIDE-HYDRALAZINE (BIDIL) 20-37.5 MG PER TABLET    Take 1 tablet by mouth 3 (three) times daily.   KETOROLAC (ACULAR LS) 0.4 % SOLN    Place 1 drop into both eyes 3 (three) times daily.    LANCETS MISC    1 Units by Does not apply route 4 (four) times daily - after meals and at bedtime.   LINACLOTIDE (LINZESS) 290 MCG CAPS CAPSULE    Take 1 capsule (290 mcg total) by mouth daily.   METOPROLOL  TARTRATE (LOPRESSOR) 25 MG TABLET    Take 0.5 tablets (12.5 mg total) by mouth 2 (two) times daily.   PREDNISONE (DELTASONE) 10 MG TABLET    5 mg ~ 2.5  mg every other day   PREGABALIN (LYRICA) 100 MG CAPSULE    Take 1 capsule (100 mg total) by mouth every evening.   TORSEMIDE (DEMADEX) 20 MG TABLET    Take 2 tablets (40 mg total) by mouth daily.  Modified Medications   Modified Medication Previous Medication   ALLOPURINOL (ZYLOPRIM) 100 MG TABLET allopurinol (ZYLOPRIM) 100 MG tablet      Take 1 tablet (100 mg total) by mouth daily.    Take 100 mg by mouth daily.   ATORVASTATIN (LIPITOR) 20 MG TABLET atorvastatin (LIPITOR) 20 MG tablet      Take 1 tablet (20 mg total) by mouth daily.    Take 20 mg by mouth daily.   LORAZEPAM (ATIVAN) 1 MG TABLET LORazepam (ATIVAN) 1 MG tablet      Take 1/2 to 1 tablet by mouth three times daily as needed    Take 1 mg by mouth daily as needed.   Discontinued Medications   INSULIN GLULISINE (APIDRA SOLOSTAR) 100 UNIT/ML INJECTION    Use 26 units before breakfast and 36 units before lunch and 32  units before dinner   LINACLOTIDE (LINZESS) 290 MCG CAPS CAPSULE    Take 1 capsule (290 mcg total) by mouth daily.   SENNOSIDES-DOCUSATE SODIUM (SENOKOT-S) 8.6-50 MG TABLET    Take 1 tablet by mouth at bedtime.

## 2013-06-21 ENCOUNTER — Other Ambulatory Visit: Payer: Self-pay | Admitting: *Deleted

## 2013-06-21 MED ORDER — ATORVASTATIN CALCIUM 20 MG PO TABS: 20.0000 mg | ORAL_TABLET | Freq: Every day | ORAL | Status: DC

## 2013-06-21 MED ORDER — ALLOPURINOL 100 MG PO TABS: 100.0000 mg | ORAL_TABLET | Freq: Every day | ORAL | Status: DC

## 2013-06-22 ENCOUNTER — Encounter: Payer: Self-pay | Admitting: Pulmonary Disease

## 2013-06-26 ENCOUNTER — Telehealth: Payer: Self-pay | Admitting: Pulmonary Disease

## 2013-06-26 MED ORDER — HYDROCODONE-HOMATROPINE 5-1.5 MG/5ML PO SYRP: 5.0000 mL | ORAL_SOLUTION | Freq: Four times a day (QID) | ORAL | Status: DC | PRN

## 2013-06-26 MED ORDER — FIRST-DUKES MOUTHWASH MT SUSP: OROMUCOSAL | Status: DC

## 2013-06-26 MED ORDER — AZITHROMYCIN 250 MG PO TABS: ORAL_TABLET | ORAL | Status: DC

## 2013-06-26 NOTE — Telephone Encounter (Signed)
Called spoke with patient who c/o wheezing, PND, sore throat x1 week and prod cough with frothy white mucus, and wheezing onset last night.  Pt denies sob, chest tightness, body aches, f/c/s, hemoptysis, nausea, vomiting.  Pt requesting rx be called into pharmacy.  [facility] [address] Last ov w/ SN 11.18.14 Received flu shot 11.21.14 at Endocrinologist's office >> documented in chart.  Dr Kriste Basque please advise, thanks.

## 2013-06-26 NOTE — Telephone Encounter (Signed)
Per SN---  zpak #1  With 2 refills. mucinex 2 po bid with plenty of fluids MMW  #120 ml   1 tsp gargle and swallow four times daily as needed Hycodan #6oz   1 tsp every 6 hours as needed for cough

## 2013-06-26 NOTE — Telephone Encounter (Signed)
Pt aware of recs. RX printed for hycodan and aware this needs to be picked up. Nothing further needed

## 2013-06-27 ENCOUNTER — Telehealth: Payer: Self-pay | Admitting: Pulmonary Disease

## 2013-06-27 NOTE — Telephone Encounter (Signed)
noted 

## 2013-07-05 ENCOUNTER — Telehealth: Payer: Self-pay | Admitting: Pulmonary Disease

## 2013-07-05 DIAGNOSIS — J449 Chronic obstructive pulmonary disease, unspecified: Secondary | ICD-10-CM

## 2013-07-05 NOTE — Telephone Encounter (Signed)
Spoke with pt-- Pt advised to taper down dose of Prednisone and stop. Pt states that she made it down to 2.5mg  and breathing issues increased. Pt increased dose back up to 5mg  and SOB has improved. Pt would like to make sure this is okay to stay at.  Cardiac Rehab states that patient does not have good insurance cov'g and would have to pay $125-150 every visit whereas with Pulmonary Rehab insurance will cover. Needs order to switch from Cardiac rehab to [facility] rehab. Pt states that she was told she also needs breathing studies to further qualify her as well.   Please advise Dr Kriste Basque. Thanks.

## 2013-07-06 NOTE — Telephone Encounter (Signed)
ATC pt and connection was bad, pt could not hear me. I called the pt back and connection was better. Pt advised of recs and appt set for PFT on 07-10-13. Order placed for pulm rehab.Carron Curie, CMA

## 2013-07-06 NOTE — Telephone Encounter (Signed)
Per SN---  Yes this is ok.    Schedule PFT with lung volume and DLCO

## 2013-07-10 ENCOUNTER — Other Ambulatory Visit: Payer: Self-pay | Admitting: Pulmonary Disease

## 2013-07-10 DIAGNOSIS — D869 Sarcoidosis, unspecified: Secondary | ICD-10-CM

## 2013-07-10 DIAGNOSIS — J45909 Unspecified asthma, uncomplicated: Secondary | ICD-10-CM

## 2013-08-06 ENCOUNTER — Encounter: Payer: Self-pay | Admitting: Internal Medicine

## 2013-08-08 ENCOUNTER — Ambulatory Visit (INDEPENDENT_AMBULATORY_CARE_PROVIDER_SITE_OTHER): Payer: Medicare Other | Admitting: Pulmonary Disease

## 2013-08-08 DIAGNOSIS — D869 Sarcoidosis, unspecified: Secondary | ICD-10-CM

## 2013-08-08 DIAGNOSIS — J45909 Unspecified asthma, uncomplicated: Secondary | ICD-10-CM

## 2013-08-08 LAB — PULMONARY FUNCTION TEST
DL/VA % PRED: 99 %
DL/VA: 4.53 ml/min/mmHg/L
DLCO UNC % PRED: 49 %
DLCO unc: 10.7 ml/min/mmHg
FEF 25-75 POST: 1.15 L/s
FEF 25-75 Pre: 1.22 L/sec
FEF2575-%Change-Post: -5 %
FEF2575-%PRED-POST: 66 %
FEF2575-%PRED-PRE: 70 %
FEV1-%Change-Post: -1 %
FEV1-%Pred-Post: 68 %
FEV1-%Pred-Pre: 69 %
FEV1-POST: 1.21 L
FEV1-PRE: 1.23 L
FEV1FVC-%CHANGE-POST: -5 %
FEV1FVC-%PRED-PRE: 104 %
FEV6-%CHANGE-POST: 3 %
FEV6-%Pred-Post: 70 %
FEV6-%Pred-Pre: 68 %
FEV6-POST: 1.55 L
FEV6-Pre: 1.5 L
FEV6FVC-%CHANGE-POST: 0 %
FEV6FVC-%Pred-Post: 103 %
FEV6FVC-%Pred-Pre: 104 %
FVC-%CHANGE-POST: 3 %
FVC-%Pred-Post: 68 %
FVC-%Pred-Pre: 65 %
FVC-Post: 1.56 L
FVC-Pre: 1.5 L
PRE FEV1/FVC RATIO: 82 %
PRE FEV6/FVC RATIO: 100 %
Post FEV1/FVC ratio: 78 %
Post FEV6/FVC ratio: 99 %
RV % PRED: 51 %
RV: 1.04 L
TLC % pred: 58 %
TLC: 2.78 L

## 2013-08-08 NOTE — Progress Notes (Signed)
PFT done today. 

## 2013-08-10 ENCOUNTER — Telehealth: Payer: Self-pay | Admitting: Pulmonary Disease

## 2013-08-10 NOTE — Telephone Encounter (Signed)
Called and spoke with pt and she is aware of PFT results per SN. Pt is aware that we will get these results faxed over to pulm rehab to get her set up with this.

## 2013-08-16 ENCOUNTER — Telehealth: Payer: Self-pay | Admitting: Pulmonary Disease

## 2013-08-16 DIAGNOSIS — J45909 Unspecified asthma, uncomplicated: Secondary | ICD-10-CM

## 2013-08-16 NOTE — Telephone Encounter (Signed)
Attempted to call molly but no answer.  New order has been placed for the pt to be sent over to pulmonary rehab.  [facility] is aware.

## 2013-08-20 ENCOUNTER — Ambulatory Visit (INDEPENDENT_AMBULATORY_CARE_PROVIDER_SITE_OTHER): Payer: Medicare Other | Admitting: Pulmonary Disease

## 2013-08-20 ENCOUNTER — Encounter: Payer: Self-pay | Admitting: Pulmonary Disease

## 2013-08-20 VITALS — BP 134/60 | HR 62 | Temp 98.5°F | Ht 63.0 in | Wt 221.8 lb

## 2013-08-20 DIAGNOSIS — E669 Obesity, unspecified: Secondary | ICD-10-CM

## 2013-08-20 DIAGNOSIS — I10 Essential (primary) hypertension: Secondary | ICD-10-CM

## 2013-08-20 DIAGNOSIS — G4733 Obstructive sleep apnea (adult) (pediatric): Secondary | ICD-10-CM

## 2013-08-20 DIAGNOSIS — D472 Monoclonal gammopathy: Secondary | ICD-10-CM

## 2013-08-20 DIAGNOSIS — N259 Disorder resulting from impaired renal tubular function, unspecified: Secondary | ICD-10-CM

## 2013-08-20 DIAGNOSIS — J984 Other disorders of lung: Secondary | ICD-10-CM

## 2013-08-20 DIAGNOSIS — I5032 Chronic diastolic (congestive) heart failure: Secondary | ICD-10-CM

## 2013-08-20 DIAGNOSIS — D869 Sarcoidosis, unspecified: Secondary | ICD-10-CM

## 2013-08-20 DIAGNOSIS — D649 Anemia, unspecified: Secondary | ICD-10-CM

## 2013-08-20 DIAGNOSIS — F411 Generalized anxiety disorder: Secondary | ICD-10-CM

## 2013-08-20 DIAGNOSIS — E1129 Type 2 diabetes mellitus with other diabetic kidney complication: Secondary | ICD-10-CM

## 2013-08-20 MED ORDER — ISOSORBIDE DINITRATE 20 MG PO TABS: 20.0000 mg | ORAL_TABLET | Freq: Three times a day (TID) | ORAL | Status: DC

## 2013-08-20 MED ORDER — HYDRALAZINE HCL 25 MG PO TABS: 25.0000 mg | ORAL_TABLET | Freq: Three times a day (TID) | ORAL | Status: DC

## 2013-08-20 NOTE — Progress Notes (Signed)
Subjective:    Patient ID: [name], female    DOB: 11/08/1945, 68 y.o.   MRN: [id number]  HPI 68 y/o BF here for a follow up visit... he has multiple medical problems as noted below...    ~  April 28, 2012:  59moROV & last visit we decreased the Pred to 564md (on this for hypercalcemia from Sarcoid) & decreased the Lasix to 2034m (due to RI w/ Cr=2.2);  Follow up labs today showed Ca=9.1 and we decided to decr the Pred further to 5mg50md;  In addition her BUN=40, Creat=2.1, BNP=196 (ok to continue Lasix20); she understands that if her Creat starts to rise further that we will have to send her to Nephrology...    She saw DrAltheimer for f/u 9/13> note reviewed & he adjusted her insulin regimen- Levemir 80uQam & Apidra 20-34-30 + SS adjustments...    She fell 8/30 & hit her head> went to ER & DrLockwood's note is reviewed- c/o headache & left knee pain; Exam showed forehead hematoma & CTBrain showed left frontal scalp hematoma, no fx, mild cortical atrophy, mild carotid atherosclerosis, NAD;  Maxillofacial CT was neg w/o facial bone fx;  Left knee prosthesis & no acute changes...    She reports that she just had an ESI injection from DrRamos for LBP & she is improved...    [name], meds, xrays and labs> see below for updates >> LABS 9/13:  [place]- BS=234, Ca=9.1, BUN=40, Creat=2.1, BNP=196   ~  June 21, 2012:  63mo 65mo& [name] went to the ER 06/14/12 w/ c/o SOB> she had developed a sore throat, wheezing, cough, & SOB; she had taken ZPak, Delsym, Proair which helped somewhat;  CXR showed cardiomeg, pulm vasc congestion, DJD in TSpine, NAD;  ER treated w/ Pred boost to 40mg/67m NEB Rx which really helped;  She notes stuff head & some drainage, denies GI/ reflux symptoms;  She is obese, cushingoid, and we were in the process of weaning off the Pred when it was boosted up by ER- we gave her a NEB Rx & this really helped;  Placed on O2 by nasal cannula for hypoxemia- 84% on  RA;  Decision made to wean Pred down to 5mgQod19m before, continue Advair250Bid, add NEBS Qid w/ Albut vs Proair, plus her Mucinex/ Fluids/ etc...    She continues her regular f/u appts w/ DrAltheimer, Endocrine> on Levemir, Apidra, Amaryl, Januvia; last A1c=8.1 is Sep2013...    [name], meds, xrays and labs> see below for updates >>   ~  October 25, 2012:  24mo ROV8moost hospital visit> she was [facility]osp [facility]> presented w/ weakness, difficulty ambulating, sl confusion w/ memory loss; she had been to DrAltheimer's office & labs showed Ca>14 w/ Creat>4;  Hypercalcemia related to Sarcoidosis- treated w/ IVF, Lasix, Calcitonin, & Solumedrol- ACE level=80;  She was disch on Pred20Bid but we will need to wean this quickly due to her DM;  She had acute renal failure as well w/ Creat>4 and after hydration she was disch w/ Creat ~2;  DM followed & treated by DrAltheimer;  DCSummary reviewed in detail>>    Sarcoidosis w/ HYPERCALCEMIA> Hypercalcemia returned when Pred was cut to 5mg Qod;59mw back on Pred20Bid post hosp & Ca=10.6; we discussed wean to 20mg/d w/32m & recheck Ca level in 3 weeks...     HBP> on Metop25- 1/2, Norvasc10, Apres10Tid, Clonidine0.1Bid, Lasix20, K10-2/d; BP= 160/72 &  she denies CP, palpit, dizzy, ch in SOB/ DOE, edema, etc...    Cardiac Arrhythmia> she has WCT & SBrady- eval by DrAllred for Cards & meds adjusted...    CHOL> on Lip20; last FLP was 4/12- looked good & we reviewed low fat diet restrictions...    DM on Insulin> followed by DrA on [place] coverage at meals; BS at home varies 150-400+, labs done Q370moby DrAltheimer...    Obesity> wt= 203# (63"Tall & BMI=37) and we reviewed diet, exercise, & wt reduction strategies...    GI> GERD on NexiumBid; and we reviewed antireflux regimen (elev HOB, npo after dinner, etc)...    Renal Insuffic> Creat has returned to 2.08 now; advised incr free water intake...    Anxiety> on Alprazolam prn; she's been  under extra stress w/ loss of job, this illness, family issues... [name], meds, xrays and labs> see below for updates >>   ~  November 08, 2012:  2week ROV & she has been stable on Pred244md; still tired, notes DOE w/ walking; BMet today shows K=5.1, BUN=51, Cr=2.1, Ca=9.4; we discussed decr Pred to 1030m, incr free water intake, continue Lasix20; ROV 569mo9mo   Meds reviewed> HBP controlled on her 5med45mChol is ok on Lip20; DM regulated by DrAltheimer on [place]imG[place]is unfortunately up to 205# & we reviewed diet/ exercise/ etc; she is coping well w/ her serious medical problems...  ~  Dec 19, 2012:  6wk ROV & recheck after her last visit w/ stable numbers on Pred20mg/54me decided to decr to 10mg/d4mollow up at this time to recheck her Calcium etc; unfortunately she made a mistake & has continued the Pred at the 20mg/d 58m since her last visit... Feeling OK overall, her wt is up 3# to 207# today, BP= 138/72, and she recently had f/u DrAltheimer to recheck her DM (insulin adjusted)...  We reviewed her medication record & her procedures and instructed her to decr the Pred to 10mg/d a46mis point... We wil  Recheck pt in 6-8weeks w/ BMet at that time...  ~  February 19, 2013:  70mo ROV &770mon last seen [name] cut her Pred to 10mg/d> sh70mes DrAltheimer frequently w/ extensive lab work done at each visit; last labs avail to us 6/14 shoKoread Calcium=9.6, BUN=36, Cr=1.86, A1c=8.5; We decided to continue Pred taper & she is instructed to decr the Pred to 05-06-09-5 Qod w/ ROV planned 69mo...  She470mo Home O2 for her Sarcoid, chronic hypoxemic resp failure, DiastolicCHF> we did Ambulatory O2 check today>  O2sats on RA:  Rest= 94% w/ pulse72;  After 3laps= 82% w/ pulse=90...     OSA on CPAP but only using 1-2d/wk    BP= 122/80 on her cardiac meds> Metop, Amlod, Hydral, Clonid, Lasix40; she denies CP, palpit, dizzy/ syncope, SOB, edema...    DM treated by DrA w/ Levemir 78uAM-0uPM, Apidra  18-30-26, Glimep4, Januv50; he [id number] to check her every month w/ extensive labs & his notes reviewed...    Despite diet etc her weight is up to 214# & we reviewed wt reduction strategy...    Renal insuffic w/ Cr ~2 chronically [name], meds, xrays and labs> see below for updates >>   ~  April 16, 2013:  70mo ROV & po69moosp check> Sakara was Adm by [facility] 9/1 - 04/10/13 w/ acute on chronic diastolic CHF when she presented w/ increased SOB & edema; BNP was 2600, CXR  showed vasc congestion, 2DEcho w/ EF=60-65% & Gr2DD, Creat= 2.5 but increased to 4.1 w/ diuresis; that forced them to replace some fluids, she was disch on Demadex40...   Since disch she has been back to see DrAltheimer 9/12 w/ labs showing BS=255, A1c=8.1, BUN=73, Cr=2.94 and she has an upcoming appts w/ Nephrology & the CHF clinic soon...  Of note the Hospitalist team held her Pred during the hosp & didn't restart after disch- recall hx Sarcoidosis w/ primarily severe hypercalcemia as manifestation, prev splenectomy w/ transient improvement then recurrent hypercalcemia & Rx w/ Pred w/ nice response but calcium bumped up to >14 when Pred weaned to 656m Qod in early 2014; we have slowly diminished her dose back to 10-5 Qod when last seen & review of labs showed Calcium levels all wnl in hosp and measured 10.1 in DrAltheimer's office 9/11... We discussed options here & we decided to RESTART PRED 10-5 QOD for the next few weeks (she is feeling weak since off the Pred) then taper to 520md til return o\in [id number] we will check BMet & ACE level at that time...  ~  June 19, 2013:  56m48moV & [name] continues to struggle w/ her multisystem disease>> from the Pulm standpoint her breathing is sl better & she is using Pred5mg51m Advair250Bid, ProairHFA as needed; her calcium level remains wnl as we have weaned the Pred (Ca=9.5 on labs yest); we discussed coming down 1/2 step to 5mg 34mernate w/ 2.5mg Q66m& we will recheck her in  56mo...30mo CARDS>>  On Metoprolol25-1/2Bid, Bidil20-37.5Tid, Demadex20-2/d;  She had f/u w/ DrMcLean 10/14> 96/78>hr diastolicCHF; sl improved since hosp 9/14 where Echo showed EF60-65%, Gr2DD, mildAS, RV normal; now on Demadex20-2Qam but labs yest showed BUN=113, Cr=4.2 (weight was up recently & they increased her to 40mgBid75m several days, now asked to hold Demadex for 2d & resume 40mg Qam55mer that w/ f/u labs); she needs f/u w/ Nephrology- DrSanford...     DM>>  Endocrine is managed by DrAltheimer on Levemir 50uBid and Apidra=> switched to Humalog (taking 18-30-26) but both are too expensive in the donut hole & she cannot afford her meds; asked to discuss w/ drAlt the poss of switch to NPH or poss 70/30 insulin to save money etc...     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     RENAL>>  She saw DrSanford 9/14> I have reviewed his perspicacious note & discussed w/ him; we have been able to wean her Pred down to 5mg/d and21mr hypercalcemia is remaining under control (there is no data on treatment for this manifestation w/ alternative meds); her CKD is most likely due to her DM & HBP, also has MGUS & the sarcoid (only clinical manifestation has been the hypercalcemia & lymphoreticular granulomatosis); NOTE: Cr prev 2 range, then 2-3 range & now 3-4 range since Cards increased diuretics for her diastolicCHF (hopefully Cr will improve somewhat as she's been on incr diuretics, water restriction, & had the gastroenteritis)...     PSORIASIS>> she sees DrTafeen for Derm; asking for refill Clobetasol cream & Atarax25mg prn i36mng [name], meds, xrays and labs> see below for updates >>  LABS 11/14 showed BS=279, BUN=113, Cr=4.2... Cards haMarland KitchenMarland Kitchenresponded w/ transient decr in diuretic dose, she needs f/u  w/  Renal... ADDENDUM>>  Full PFTs done 08/08/13 prior to getting her enrolled into PulmRehab program>> FVC=1.50 (65%), FEV1=1.23 (69%), %1sec=82, no improvement in FEV1 post bronchdilator, Lung volumes are mild to mod restricted, DLCO is reduced but normalizes after correction for VA...  ~  August 20, 2012:  16moROV & [name] tells me she had a URI over Christmas that lasted 3 wks, she kept the Pred at 560md stating that she felt worse when she cut it to 5-2.5 Qod ("I felt weak")...     She is awaiting a call from [facility] get enrolled...    She needs a substitute for Bidil (20-37.5Tid)- too $$- and we discussed dividing it up into Isordil2081md and Hydralazine 76m33m...    She continues to see DrAltheimer for Endocrine, DM & his extensive notes are reviewed; A1c improved into the 8's...     She saw DrPerry w/ constipation- treated w/ Linzess but too $$, given samples and she will f/u w/ him; she also notes that Nexium definitely works better for her...    She is seeing DrSanford for Nephrology & he is favoring peritoneal dialysis but she isn't so sure; has f/u soon to recheck her renal function soon...    Her prev rash is resolved off Allopurinol... [name], meds, xrays and labs> see below for updates >> she had the 2014 Flu vaccine in Nov...          Problem List:    OBSTRUCTIVE SLEEP APNEA - Sleep Study 11/07 showed RDI=21 w/ desat to 62% during REM... eMarland Kitchenal by DrSoBristowRx for CPAP 12... compliance poor- only using it 1-2 times/wk... encouraged to use CPAP more regularly & to f/u w/ DrSood regarding the mask interface problems... ~  8/11:  She reports new mask but still not using CPAP regularly, "I rest fairly well". ~  7/12:  She reports not using her CPAP, and not resting well... ~  1/13:  [facility] but still not compliant w/ home use... ~  7/14:  OSA on CPAP but only using 1-2d/wk... we did Ambulatory O2 check today>  O2sats on RA:   Rest= 94% w/ pulse72;  After 3laps= 82% w/ pulse=90...  ~  11/14: encouraged to use the CPAP more regularly...  BRONCHITIS, ACUTE - on ADVAIR 250Bid (using Prn only) & PROAIR as needed. 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 ~  CXR 1/09 was pre-op for TKR- sl cardomeg & ?mild vasc congestion. ~  CXR 2/11 showed norm heart size & vascularity, clear, s/p cholecystectomy, DJD in TSpine. ~  CXR & CT Chest 11/11 showed sl peribronch thickening, no lung lesions, no signif adenopathy. ~  PET scan 1/12 showed hypermetabolic activ in spleen & lymph nodes in the supraclav, hilar, mediastinal, periaotic & iliac region as well. ~  CXRs 3/12 in the perioperative period after splenectomy showed left effusion==> tapped & improved aeration. ~  CXR 4/12 back to baseline w/ clear bases, essent wnl... ~  CXR 7/12 showed norm heart size, clear lungs, no definite adenopathy, NAD... ~  LABS 7/12 w/ ACE=78 (8-52), Ca=12.0, AlkPhos=176 (39-117), GGT=151 (7-51) ~  Labs from DrAltheimer> 8/12: Ca=12.4, Creat=1.8; then 9/12: Ca=13.4, Creat=2.3;  ~  10/12:  PREDNISONE 20mg109mtarted 10/12 & 3wks later Ca=10.3, Creat=1.8; therefore weaned to 20-10 Qod. ~  11/12: Labs on Pred 20-10 Qod showed Ca++= 9.8; we will continue to wean Pred. ~  1/13:  CXR  showed cardiomeg, mild interstitial edema, DJD spine; Labs showed Ca=9.7 & we decided to wean Pred to 51m/d... ~  3/13:  Labs showed Ca=10.1 on Pred 53md; decided to wean to 91m81md... ~  6/13:  Labs showed Ca=11.0, ACE=86; on Pred5Qod & rec to incr back to 52m46m.. ~  8/13:  CXR showed cardiomeg, ?mild interstitial edema, mild DJD TSpine... ~  Labs 8/13 showed Ca=10.0 on Pred10/d; rec to decr further to 91mg/61m. ~  Labs 9/13 showed Ca= 9.1 on Pred91mg/d31mshe will try to decr to 5Qod again... ~  11/13: she recently went to ER w/ incr SOB & was placed on Pred40/d; we are adding NEBS w/ ALBUT2.91mgQid93mwean Pred back to 91mgQod 48m before. ~  3/14: she was hosp w/ Ca>14 (on the Pred91mgQod);34meated w/ IVF, Lasix, Calcitonin & incr steroids; Disch on Pred20Bid; we will wean Pred to 20mg/d & 21min 3wk. ~  4/14:  On Pred20/d & calcium improved to 9.4; we will decr to Pred52mg/d & f72mmo... ~  721mo  On Pred52mg/d & cal44m remains well controlled at 9.6 and we decided to decr Pred to 05-06-09-5 Qod... ~  9/14:  Hosp for acute on chr diastolic CHF & [facility] stopped her Pred; Calcium levels in hosp were 9.6-10.1 and recent labs by DrAltheimer showed Ca=10.1; REC- restart Pred 10-5 QOD & try to wean to 91mg/d w/ f/u 44ms in 5mo... ~  CXR 66mo showed mild cardiomeg (stable), mild vasc congestion, sl bronch wall thickening & mild elev right hemidiaph (no changes)... ~  11/14: Labs shows Ca=9.5 on Pred91mg daily; Rec 31mdecr further to 91mg alt w/ 2.91mg491mD...  HYPER11mSION >> on numerous meds w/ med changes over time >> she is asked to bring all med bottles to the visits but never does! ~  1/13: Post Hosp BP= 146/[place]- Metop12.5, 291mod10, Clonid0.1Bid, Lasix20; she denies CP, palpit, SOB, edema> improved from recent hosp... ~  3/13: She called in the interim for "refill" Apresoline not prev on any med list; BP= 150/80 on all 5 meds; must elim sodium & get wt down... ~  6/13:  BP= 142/80 on 91meds but BUN-41 Cr41m=2.3 BNP=104; rec to stop Lasix for now, f/u BMet 6wks. ~  8/13:  BP= 140/70 on same meds & BUN=30, Creat=1.6, but BNP incr to 2960 & LASIX 40mg/d restarted... 19m/13:  Follow up labs showed BUN=45, Creat=2.2 on Lasix40 and we rec decr to Lasix20... ~  9/13:  BP= 122/74 on 91meds w/ BUN=40, Crea34m.1, & BNP=196... Continue same. ~  11/13:  BP= 142/68 on same 91meds... ~  3/14:  BP=6m0/72 on same 5 meds for now... ~  4/14:  BP= 128/70 on her 91med regimen...  ~  7/126m BP= 122/80 on her 5 cardiac meds> Metop, Amlod, Hydral, Clonid, Lasix40; she denies CP, palpit, dizzy/ syncope, SOB, edema... ~  9/14:  She was hosp w/  acute on chronic diastolic CHF & meds changed; disch on Metoprolol25-1/2, Bidil 20-37.5Tid, Demadex20-2/d; BP= 128/70 but recent labs by DrA showed BUN=73, Cr=2.94 & she has f/u w/ Neph & CHF clinic... ~  11/14: on same meds & BP= 138/64...  1/15 BP+ 134/60...  CAD, CHRONIC DIASTOLIC CHF,  & CARDIAC ARRHYTHMIA >> on ASA 81mg/d...  ~  Hx non-obs47mtive CAD w/ cath 6/07 by DrStuckey showing luminal irregularities & tiny first marginal branch of the CIRC w/ ostial lesion... good LVF. ~  2DEcho 9/07 showed mildly calcif AoV and normal  LVF & wall motion. ~  2DEcho 11/11 showed norm LV wall thickness, norm LVF w/ EF= 60-65%, norm atria, norm valves, trivial peric fluid behind heart. ~  2DEcho 1/13 showed mild LVH, norm LVF w/ EF=55-60% & no regional wall motion abn, Gr2DD, norm RV... ~  3/14: In hosp for hypercalcemia etc> she had cardiac arrhythmia w/ WCT & SBrady- to be f/u DrAllred... ~  5/14: she had f/u DrAllred> hosp f/u for WCT (atrial tachy per EP), pt asymptomatic- denies CP, palpit, SOB, dizzy/ syncope, edema, etc; no further w/u planned, f/u prn... ~  7/14: she had f/u DrWall> chr diastolicCHF, nonobstructiveCAD, mildMR- stable, no changes made, f/u 30yr.. ~  9/14:  She was adm by [facility] w/ acute on chr diastolicCHF (BUXL=2440 CXR w/ vasc congestion, 2DEcho w/ EF60-65% & Gr2DD); meds adjusted & diuresed but Creat incr to 4.1; eventually disch on Demadex40 & Cr= 3.3=>2.9; she has appt in CHF clinic... ~  11/14: she has been actively followed by CHF clinic & DrMcLean w/ med adjustments...  CEREBROVASCULAR DISEASE - she remains on ASA 881md without TIA's or other neuro manifestations...  ~  MRA Br 9/07 showed mod intracranial atherosclerotic changes...   HYPERLIPIDEMIA - on LIPITOR 2086m... FLP monitored at each OV [place] DrAltheimer... ~  FLPAquia Harbour07 showed TChol 114, TG 85, HDL 28, LDL 69 ~  FLP 4/09 showed TChol 154, TG 192, HDL 34, LDL 82... discussed poss of adding fibrate- hold... ~  FLP 2/10  on Lip20 showed TChol 159, TG 207, HDL 29, LDL 83... rec> add Fenoglide (she didn't). ~  FLPWilber11 on Lip20 showed TChol 185, TG 242, HDL 43, LDL 99... add FENOFIBRATE 160m37m.. ~  FLP [place]1 on Lip20+Feno160 showed TChol 167, TG 216, HDL 37, LDL 96... she stopped Feno160. ~  FLP [place]2 on Lip20 showed TChol 146, TG 115, HDL 35, LDL 88 ~  FLP 2/13 on Lip20 showed TChol 159, TG 176, HDL 41, LDL 86 ~  LABS checked by DrAltheimer  DM - on LEVEMIR & APIDRA SS coverage now per DrAltheimer; plus prev on GLIMEPIRIDE 4mg/53mnd JANUVIA 50mg/19m  labs 4/08 showed BS=138 & HgA1c=7.5... homMarland Kitchen BS = 100 to >300... misses doses and not on diet or exercising... ~  1/09 became hypoglycemic in hosp on 4 meds post knee surg- meds adjusted... ~  3/09 restarted GLIMEPIRIDE 4mg- 164mtab each AM, & continue Lantus & Metformin... ~  labs 4/09 showed BS= 148, HgA1c= 7.3... rec>Marland Kitchenback on 4 med regimen due to poor control at home. ~  6/09 discussed titrating the Lantus up until FBS 100-120 range... ~  labs 2/10 showed BS= 164, HgA1c= 8.6... veryMarland KitchenMarland Kitchenisappointing- needs home BS monitoring, incr Lantus. ~  labs 2/11 showed BS= 298, A1c= 8.4...  discussed change Lantus to LEVEMIR 40 u daily... ~  labs 8/11 showed BS= 202, A1c= 10.9... rec>Marland Kitchenincr Levemir to 50, consider Humalog. ~  Nov-Dec 2011:  BS up w/ adjust of meds & addition of Pred after hosp 11/11 & hypercalcemia... ~  She states she returned to Levemir 40u daily after the splenectomy hosp due to appetite etc;  4/12 BS=117 ~  Labs 7/12 showed BS= 167, A1c= 8.4> on Levemir 50u/d & Amaryl 4mg Qam80mote creat 1.9). ~  8-10/12: on Levemir30 + ApidraSS per DrA & BS are 150-400+ due to the Pred20; A1c 8/12 was 7.6 ~  11/12:  DrA has increased her Levemir to 46u/d & the Apidra to 10+SS cover Tid... BS here= 168 ~  1/13: Covered w/ SSI in [place] but A1c=9.3 (Creat up to 2.4 but improved to 1.7 w/ adjust meds)... ~  DrAltheimer's notes indicates continued adjustments in her insulin  regimen w/ Levemir & Apidra... ~  9/14:  He prev oral meds were stopped & currently taking Levemir40Bid & Apidra incr to 24-34-30 by DrA; recent BS=255, A1c=8.1 ~  11/14:  meds adjusted, on Levemir 50uBid and Apidra=> switched to Humalog (taking 18-30-26) but both are too expensive in the donut hole & she cannot afford her meds; asked to discuss options w/ DrAltheimer.  OBESITY (ICD-278.00) - weight down to 171 after hosp 11/11- doing better on diet, not yet exercising. ~  weight 220-230# in the early 1990's... ~  weight 210-220# in the early 2000's... ~  weight 2/10 = 192# ~  weight 2/11 = 186# ~  weight 8/11 = 174# ~  weight 12/11 = 171# ~  Weight 4/12 = 168# ~  Weight 7/12 = 171# ~  Weight 11/12 = 188# ~  Weight 1/13 = 196# ~  Weight 3/13 = 205#... She MUST get back on diet, incr exercise, get wt down. ~  Weight 6/13 = 205# ~  Weight 8/13 = 210# ~  Weight 9/13 = 220#... What happened? ~  Weight 11/13 = 225# and BMI= 40 ~  Weight 3/14 = 203# ~  Weight 7/14 = 214# ~  Weight 9/14 = 216# ~  Weight 11/14 = 224# and we reviewed diet, exercise & wt reduction strategies...  HYPERCALCEMIA> this was most likely due to sarcoidosis w/ extensive splenic involvement;  Calcium was as hight as 13.7 and returned to normal after the splenectomy. ~  Labs 4/12 showed calcium = 11.1 & this is very disappointing... ~  Labs 7/12 showed Ca= 12.0, Phos= 4.0, ACE= 78 ~  Labs 8-10/12 showed Ca up to 13.4, then down to 10.3 on PRED56m/d... ~  Labs 11/12 showed Ca= 9.8 on Pred 20-10 qod; we will continue to wean. ~  [place]= 9.7 range on Pred 10-5 Qod;  We decided to wean Pred to 546md... ~  Labs 3/13 showed Ca=10.1 on Pred5m64m; rec to decr to 5mg38m... ~  Labs 6/13 showed Ca=11.0 on Pred5Qod; rec incr to 10mg68m. ~  Labs 8/13 showed Ca=10.0 on Pred10/d; rec to decr to 5mg/d54m ~  Labs 9/13 showed Ca=9.1 on Pred5mg/d;58mc to decr to 5mgQod.44m~  Labs 12/13 showed Ca= 9.2 to 10.5 on  Pred 5mgQod..25m  Labs 3/14 in hosp showed Ca= 14.5 & disch on Pred20Bid; 3/14 f/u Ca=10.6 & Pred decr to 20mg/d...28m4/14: on Pred20/d and f/u labs showed Ca= 9.4... Rec to decr Pred to 10mg/d & f6mmo... ~  7667mo on Pred10/d and most recent Ca= 9.6... rec to deMarland KitchenMarland Kitchen Pred to 05-06-09-5 Qod... ~  9/14: Pred was stopped by [facility] duringh 9/14 hosp & serial calcium levels reviewed- 9.6=>10.1; we decided to restart her Pred10-5 QOD for now & try to wean to 5mg/d by f/u3m 67mo w/ repeat63mos... ~  11/14: on Pred5mg/d and Ca=962m rec to decr to 5mg alternate w46m.5mg QOD...  GERD53mon NEXIUM 40mg Bid... w/ in81msed reflux symptoms w/ noct cough etc...  ~  2/10: discussed optimal Rx w/ Nex before dinner, Zantac300 + Reglan10 at bed, elev HOB, etc. ~  2/11: she stopped the Reglan, still using Nexium/ Zantac but w/ persist symptoms>  refer to GI for eval. ~  6/11: GI f/u DrPerry w/  EGD that was normal... continue Rx. ~  12/11:  increased GI symptoms post hosp> she will f/u w/ DrPerry for his input> incr Nexium Bid. ~  She remains on Nexium 46mBid regularly...  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 ~  6/11:  f/u colonoscopy by DrPerry showed divertics, 4 polyps, AVM..Marland KitchenMarland Kitchenpath= tubular adenoma, f/u 559yr ~  9/14: she describes abd cramps, sick feeling when she has to go- better after BM, takes Senakot-S and Bentyl10Qid prn...  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 ~  Note: Serum Calcium ret to normal immediately after spleen removed, but hypercalcemia returned after several months forcing usKoreao start Rx w/ Pred...  RENAL INSUFFICIENCY (ICD-588.9) >> Creat ~ 2.0 & eval 10/11 by DrFox- prob due to DM & HBP... ~  Labs 4/12 showed BUN= 27, Creat= 1.6 ~   Labs 7/12 showed BUN= 44, Creat= 1.9 (not on diuretics/ NSAIDs/ etc)... ~  Labs showed Creat up to 2.3 when Ca=13.4; now improved to 1.8 w/ Ca coming down... ~  Labs 11/12 showed BUN=38, Creat=1.7; rec to maintain hydration... ~  Labs 1/13 in [facility]/ diuresis showed Creat incr to 2.4 but ret to 1.7 w/ adjustment in meds... ~  Labs 3/13 showed BUN=38, Creat= 1.6 ~  Labs 6/13 showed BUN=41, Creat= 2.3; rec> stop Lasix20 for now, liberalize fluids. ~  Labs 9/13 showed BUN=40, Creat=2.1 on Lasix20, rec to continue the same. ~  Labs 12/13 showed Creat= 1.5 ~  Labs 3/14 in hosp showed Creat=4, then decr to 2.00 by dischage... ~  Labs 4/14 showed BUN=51, Creat=2.1, and rec to incr free water as we wean the Pred... 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 ~  11/14: Labs are worse w/ BUN=113, Creat=4.2 after recent gastroent 7 diuretics increased by Cards for wt gain; they have adjusted down & she needs Renal follow up...  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 on LYRICA 10078mhs, off prev Tramadol Rx... ~  [facility] Tramadol 71m2myrica 75mg51m..  GOUT, UNSPECIFIED (ICD-274.9) - Uric in the 7-8 range...on ALLOPURINOL 300mg/62mr prevention...  ANXIETY (ICD-300.00) - she is under mod stress- work, mother died, hospice counselling, etc... ~  8/11:  rec starting Alprazolam 0.5mg Ti57mor palpit, SOB, anxiety...  ANEMIA-NOS & MGUS >> see eval 10-11/11 by DrGranfortuna w/ bone marrow 11/11 in hosp= neg. ~  Labs 4/12 showed Hg= 11.6, MCV= 84... On FeSO4 daily. ~  Labs 7/12 showed Hg= 12.8, MCV= 90, Fe= 45 (14%sat)... ~  Labs 1/13 in [facility]osp [facility] Hg=10.4 but improved to 12.9 on post hosp check in office... ~  Labs 6/13 showed Hg= 12.4 ~  Labs  12/13 showed Hg= 13.9 ~  Labs 3/14 showed Hg= 12.9 ~  Labs 9/14 during hosp showed Hg= 10-11 range...  PSORIASIS - eval and Rx per DrTafeen prev on Humira injections- stopped 9/11...   Past Surgical History  Procedure Laterality Date  . Vesicovaginal fistula closure w/  total abdominal hysterectomy  1992  . Bilateral total  knee replacements Bilateral Rt=5/04 & Lft=1/09    by DrAlusio  . 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 Zella Richer  . Appendectomy  1957  . Tonsillectomy  1968  . Cholecystectomy  1980's  . Abdominal hysterectomy  1992  . Reduction mammaplasty Bilateral 1980's  . Cardiac catheterization  1990's  . Thoracentesis  2012    Outpatient Encounter Prescriptions as of 08/20/2013  Medication Sig  . albuterol (PROVENTIL HFA;VENTOLIN HFA) 108 (90 BASE) MCG/ACT inhaler Inhale 2 puffs into the lungs every 6 (six) hours as needed for wheezing or shortness of breath.  . Alum & Mag Hydroxide-Simeth (MAGIC MOUTHWASH) SOLN 1 tsp 4 times daily gargle and swallow  . aspirin EC 81 MG tablet Take 81 mg by mouth daily.  Marland Kitchen atorvastatin (LIPITOR) 20 MG tablet Take 1 tablet (20 mg total) by mouth daily.  Marland Kitchen dicyclomine (BENTYL) 10 MG capsule Take 10 mg by mouth 4 (four) times daily -  before meals and at bedtime.  Marland Kitchen esomeprazole (NEXIUM) 40 MG capsule Take 1 capsule (40 mg total) by mouth 2 (two) times daily.  . ferrous sulfate 325 (65 FE) MG tablet Take 325 mg by mouth daily with breakfast.  . fluticasone (FLONASE) 50 MCG/ACT nasal spray Place 2 sprays into the nose at bedtime.  . Fluticasone-Salmeterol (ADVAIR) 250-50 MCG/DOSE AEPB Inhale 1 puff into the lungs 2 (two) times daily.  Marland Kitchen HYDROcodone-acetaminophen (NORCO/VICODIN) 5-325 MG per tablet Take 2 tablets by mouth every 4 (four) hours as needed for pain.  Marland Kitchen HYDROcodone-homatropine (HYCODAN) 5-1.5 MG/5ML syrup Take 5 mLs by mouth every 6 (six) hours as needed for cough.  . hydrOXYzine (ATARAX/VISTARIL) 25 MG tablet Take 1 tablet (25 mg  total) by mouth every 4 (four) hours as needed.  . insulin detemir (LEVEMIR) 100 UNIT/ML injection Inject 56 Units into the skin 2 (two) times daily.   . insulin lispro (HUMALOG KWIKPEN) 100 UNIT/ML SOPN Take 24 units in the morning, 34 units in the afternoon and 30 units in the evening.  Marland Kitchen ketorolac (ACULAR LS) 0.4 % SOLN Place 1 drop into both eyes 3 (three) times daily.   . Lancets MISC 1 Units by Does not apply route 4 (four) times daily - after meals and at bedtime.  . Linaclotide (LINZESS) 290 MCG CAPS capsule Take 1 capsule (290 mcg total) by mouth daily.  Marland Kitchen LORazepam (ATIVAN) 1 MG tablet Take 1/2 to 1 tablet by mouth three times daily as needed  . metoprolol tartrate (LOPRESSOR) 25 MG tablet Take 0.5 tablets (12.5 mg total) by mouth 2 (two) times daily.  . predniSONE (DELTASONE) 5 MG tablet Take 5 mg by mouth daily with breakfast.  . pregabalin (LYRICA) 100 MG capsule Take 1 capsule (100 mg total) by mouth every evening.  . torsemide (DEMADEX) 20 MG tablet Take 2 tablets (40 mg total) by mouth daily.  . isosorbide-hydrALAZINE (BIDIL) 20-37.5 MG per tablet Take 1 tablet by mouth 3 (three) times daily.  . [DISCONTINUED] allopurinol (ZYLOPRIM) 100 MG tablet Take 1 tablet (100 mg total) by mouth daily.  . [DISCONTINUED] azithromycin (ZITHROMAX Z-PAK) 250 MG tablet Take as directed  . [DISCONTINUED] azithromycin (ZITHROMAX) 250 MG tablet Take as directed  . [DISCONTINUED] clobetasol cream (TEMOVATE) 2.20 % Apply 1 application topically 2 (two) times daily.  . [DISCONTINUED] Diphenhyd-Hydrocort-Nystatin (FIRST-DUKES MOUTHWASH) SUSP 1 tsp gargle and swallow 4 times a day  . [DISCONTINUED] predniSONE (DELTASONE) 10 MG tablet 5 mg ~ 2.5  mg every other day  Allergies  Allergen Reactions  . Adhesive [Tape] Other (See Comments)    Blisters   . Avelox [Moxifloxacin Hcl In Nacl]     GI upset  . Codeine Other (See Comments)    "crazy"   . Guaifenesin Nausea And Vomiting  . Latex Swelling   . Oxycodone Nausea And Vomiting    Current Medications, Allergies, Past Medical History, Past Surgical History, Family History, and Social History were reviewed in [employer] record.   Review of Systems         See HPI - all other systems neg except as noted... The patient complains of dyspnea on exertion.  The patient denies anorexia, fever, weight loss, weight gain, vision loss, decreased hearing, hoarseness, chest pain, syncope, peripheral edema, prolonged cough, headaches, hemoptysis, abdominal pain, melena, hematochezia, severe indigestion/heartburn, hematuria, incontinence, muscle weakness, suspicious skin lesions, transient blindness, difficulty walking, depression, unusual weight change, abnormal bleeding, enlarged lymph nodes, and angioedema.     Objective:   Physical Exam      WD, Overweight, 68 y/o BF in NAD...  GENERAL:  Alert & oriented; pleasant & cooperative... HEENT:  [place]/AT, EOM-wnl, PERRLA, EACs-clear, TMs-wnl, NOSE-clear, THROAT- clear & wnl... NECK:  Supple w/ fair ROM; no JVD; normal carotid impulses w/o bruits; palp thyroid, w/o nodules felt; no lymphadenopathy. CHEST:  Clear to P & A; without wheezes/ rales/ or rhonchi. HEART:  Regular Rhythm; gr 1/6 SEM, without rubs/ or gallops. ABDOMEN:  Scar from splenectomy surg; obese, soft, & nontender w/ panniculus; normal bowel sounds; no organomegaly or masses detected EXT: without deformities, mod arthritic changes, s/p bilat TKRs; no varicose veins/ +venous insuffic/ tr edema. NEURO:  CN's intact; no focal neuro deficits... DERM:  Psoriasis rash per DrTafeen...  RADIOLOGY DATA:  Reviewed in the EPIC EMR & discussed w/ the patient...  LABORATORY DATA:  Reviewed in the EPIC EMR & discussed w/ the patient...   Assessment & Plan:    SARCOIDOSIS>  Main manifestation has been her hypercalcemia> she has proven that when her Pred is weaned to 57mQod the hypercalcemia returns; we are trying to  determine her lowest dose threshold & Ca is now 9.5 on Pred559md; we discussed slow taper to 31m27mlternate w/ 2.31mg46mD if able...  Hypercalcemia>  Initially responded to splenectomy but her calcium level rose again over several months after the surg; we were forced to use Pred rx in this difficult diabetic; we had her see DrAltheimer for DM/ endocrine consult; calcium levels have responded nicely to Pred Rx but climbed back up when Pred was weaned to 31mgQ26mearly in 2014 (see above)...    ASTHMATIC BRONCHITS>  ER 11/13 bumped her Pred to 40mg/46mich negated our slow taper w/ monitoring of her Calcium levels; NEB Rx w/ albut seemed to give the the max benefit & we need to wean down the Pred as much as poss; she is also hypoxemic w/ O2sat=84% on RA (likely from V/Q mismatching);  We decided to Rx w/ HomeO2- and she improved over time; now on AdvairMcKeansburgeded, plus the Predmg used for her hypercalcemia...  HBP>  Meds were adjusted 9/14 hosp and BP is current wnl on her on Metoprolol25-1/2, Bidil 20-37.5Tid, Demadex20-2/d since disch 9/14; she has f/u CHF clinic...  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  DM>  Control has been difficult;  Stressed diet/ exercise, & get wt down; DrA has her on Levemir &  HumalogSS coverage...  Renal Insuffic>  Creat was up to 4 in [place] again & then back to 2.9 w/ adjust in meds; now back to 4.2 w/ incr diuretics per Cards; she will f/u w/ DrSanford for Nephrology...  Other medical problems as noted...   Patient's Medications  New Prescriptions   HYDRALAZINE (APRESOLINE) 25 MG TABLET    Take 1 tablet (25 mg total) by mouth 3 (three) times daily.   ISOSORBIDE DINITRATE (ISORDIL) 20 MG TABLET    Take 1 tablet (20 mg total) by mouth 3 (three) times daily.  Previous Medications   ALBUTEROL (PROVENTIL HFA;VENTOLIN HFA) 108 (90 BASE) MCG/ACT INHALER    Inhale 2 puffs into the lungs every 6 (six)  hours as needed for wheezing or shortness of breath.   ALUM & MAG HYDROXIDE-SIMETH (MAGIC MOUTHWASH) SOLN    1 tsp 4 times daily gargle and swallow   ASPIRIN EC 81 MG TABLET    Take 81 mg by mouth daily.   ATORVASTATIN (LIPITOR) 20 MG TABLET    Take 1 tablet (20 mg total) by mouth daily.   DICYCLOMINE (BENTYL) 10 MG CAPSULE    Take 10 mg by mouth 4 (four) times daily -  before meals and at bedtime.   ESOMEPRAZOLE (NEXIUM) 40 MG CAPSULE    Take 1 capsule (40 mg total) by mouth 2 (two) times daily.   FERROUS SULFATE 325 (65 FE) MG TABLET    Take 325 mg by mouth daily with breakfast.   FLUTICASONE (FLONASE) 50 MCG/ACT NASAL SPRAY    Place 2 sprays into the nose at bedtime.   FLUTICASONE-SALMETEROL (ADVAIR) 250-50 MCG/DOSE AEPB    Inhale 1 puff into the lungs 2 (two) times daily.   HYDROCODONE-ACETAMINOPHEN (NORCO/VICODIN) 5-325 MG PER TABLET    Take 2 tablets by mouth every 4 (four) hours as needed for pain.   HYDROCODONE-HOMATROPINE (HYCODAN) 5-1.5 MG/5ML SYRUP    Take 5 mLs by mouth every 6 (six) hours as needed for cough.   HYDROXYZINE (ATARAX/VISTARIL) 25 MG TABLET    Take 1 tablet (25 mg total) by mouth every 4 (four) hours as needed.   INSULIN DETEMIR (LEVEMIR) 100 UNIT/ML INJECTION    Inject 56 Units into the skin 2 (two) times daily.    INSULIN LISPRO (HUMALOG KWIKPEN) 100 UNIT/ML SOPN    Take 24 units in the morning, 34 units in the afternoon and 30 units in the evening.   KETOROLAC (ACULAR LS) 0.4 % SOLN    Place 1 drop into both eyes 3 (three) times daily.    LANCETS MISC    1 Units by Does not apply route 4 (four) times daily - after meals and at bedtime.   LINACLOTIDE (LINZESS) 290 MCG CAPS CAPSULE    Take 1 capsule (290 mcg total) by mouth daily.   LORAZEPAM (ATIVAN) 1 MG TABLET    Take 1/2 to 1 tablet by mouth three times daily as needed   METOPROLOL TARTRATE (LOPRESSOR) 25 MG TABLET    Take 0.5 tablets (12.5 mg total) by mouth 2 (two) times daily.   PREDNISONE (DELTASONE) 5 MG TABLET     Take 5 mg by mouth daily with breakfast.   PREGABALIN (LYRICA) 100 MG CAPSULE    Take 1 capsule (100 mg total) by mouth every evening.   TORSEMIDE (DEMADEX) 20 MG TABLET    Take 2 tablets (40 mg total) by mouth daily.  Modified Medications   No medications on file  Discontinued Medications  ALLOPURINOL (ZYLOPRIM) 100 MG TABLET    Take 1 tablet (100 mg total) by mouth daily.   AZITHROMYCIN (ZITHROMAX Z-PAK) 250 MG TABLET    Take as directed   AZITHROMYCIN (ZITHROMAX) 250 MG TABLET    Take as directed   CLOBETASOL CREAM (TEMOVATE) 0.05 %    Apply 1 application topically 2 (two) times daily.   DIPHENHYD-HYDROCORT-NYSTATIN (FIRST-DUKES MOUTHWASH) SUSP    1 tsp gargle and swallow 4 times a day   ISOSORBIDE-HYDRALAZINE (BIDIL) 20-37.5 MG PER TABLET    Take 1 tablet by mouth 3 (three) times daily.   PREDNISONE (DELTASONE) 10 MG TABLET    5 mg ~ 2.5  mg every other day

## 2013-08-20 NOTE — Patient Instructions (Signed)
Today we updated your med list in our EPIC system...  Keep the Prednisone at 5mg  daily...     We gave you some Linzess samples and you can try to cut the dose to one every other day to the lowest dose that helps the constipation...  We changed the Bidil to it's components>>    Isordil (Isosorbide dinitrate) 20mg  one tab three times daily    Apresoline (Hydralazine) 25mg  three times daily...  Call for any questions or if I can be of service in any way...  Let's plan a follow up visit in 60mo, sooner if needed for problems.Marland KitchenMarland Kitchen

## 2013-08-23 ENCOUNTER — Telehealth: Payer: Self-pay | Admitting: Pulmonary Disease

## 2013-08-23 NOTE — Telephone Encounter (Signed)
I called and spoke with pt. Advised her it takes 4-6 weeks to get in. She needed nothing further

## 2013-08-25 ENCOUNTER — Emergency Department (HOSPITAL_BASED_OUTPATIENT_CLINIC_OR_DEPARTMENT_OTHER)
Admission: EM | Admit: 2013-08-25 | Discharge: 2013-08-25 | Disposition: A | Discharge status: Home / Self Care | Payer: Medicare Other | Attending: Emergency Medicine | Admitting: Emergency Medicine

## 2013-08-25 ENCOUNTER — Encounter (HOSPITAL_BASED_OUTPATIENT_CLINIC_OR_DEPARTMENT_OTHER): Payer: Self-pay | Admitting: Emergency Medicine

## 2013-08-25 ENCOUNTER — Other Ambulatory Visit: Payer: Self-pay | Admitting: Pulmonary Disease

## 2013-08-25 ENCOUNTER — Emergency Department (HOSPITAL_BASED_OUTPATIENT_CLINIC_OR_DEPARTMENT_OTHER): Payer: Medicare Other

## 2013-08-25 DIAGNOSIS — E785 Hyperlipidemia, unspecified: Secondary | ICD-10-CM | POA: Insufficient documentation

## 2013-08-25 DIAGNOSIS — IMO0002 Reserved for concepts with insufficient information to code with codable children: Secondary | ICD-10-CM | POA: Insufficient documentation

## 2013-08-25 DIAGNOSIS — J441 Chronic obstructive pulmonary disease with (acute) exacerbation: Secondary | ICD-10-CM | POA: Insufficient documentation

## 2013-08-25 DIAGNOSIS — J45901 Unspecified asthma with (acute) exacerbation: Secondary | ICD-10-CM

## 2013-08-25 DIAGNOSIS — M109 Gout, unspecified: Secondary | ICD-10-CM | POA: Insufficient documentation

## 2013-08-25 DIAGNOSIS — Z7982 Long term (current) use of aspirin: Secondary | ICD-10-CM | POA: Insufficient documentation

## 2013-08-25 DIAGNOSIS — I2510 Atherosclerotic heart disease of native coronary artery without angina pectoris: Secondary | ICD-10-CM | POA: Insufficient documentation

## 2013-08-25 DIAGNOSIS — M545 Low back pain, unspecified: Secondary | ICD-10-CM | POA: Insufficient documentation

## 2013-08-25 DIAGNOSIS — Z860100 Personal history of colon polyps, unspecified: Secondary | ICD-10-CM | POA: Insufficient documentation

## 2013-08-25 DIAGNOSIS — Z9981 Dependence on supplemental oxygen: Secondary | ICD-10-CM | POA: Insufficient documentation

## 2013-08-25 DIAGNOSIS — E669 Obesity, unspecified: Secondary | ICD-10-CM | POA: Insufficient documentation

## 2013-08-25 DIAGNOSIS — G4733 Obstructive sleep apnea (adult) (pediatric): Secondary | ICD-10-CM | POA: Insufficient documentation

## 2013-08-25 DIAGNOSIS — D649 Anemia, unspecified: Secondary | ICD-10-CM | POA: Insufficient documentation

## 2013-08-25 DIAGNOSIS — Z794 Long term (current) use of insulin: Secondary | ICD-10-CM | POA: Insufficient documentation

## 2013-08-25 DIAGNOSIS — F411 Generalized anxiety disorder: Secondary | ICD-10-CM | POA: Insufficient documentation

## 2013-08-25 DIAGNOSIS — Z91040 Latex allergy status: Secondary | ICD-10-CM | POA: Insufficient documentation

## 2013-08-25 DIAGNOSIS — R110 Nausea: Secondary | ICD-10-CM | POA: Insufficient documentation

## 2013-08-25 DIAGNOSIS — L408 Other psoriasis: Secondary | ICD-10-CM | POA: Insufficient documentation

## 2013-08-25 DIAGNOSIS — G8929 Other chronic pain: Secondary | ICD-10-CM | POA: Insufficient documentation

## 2013-08-25 DIAGNOSIS — Z95818 Presence of other cardiac implants and grafts: Secondary | ICD-10-CM | POA: Insufficient documentation

## 2013-08-25 DIAGNOSIS — R21 Rash and other nonspecific skin eruption: Secondary | ICD-10-CM | POA: Insufficient documentation

## 2013-08-25 DIAGNOSIS — I10 Essential (primary) hypertension: Secondary | ICD-10-CM | POA: Insufficient documentation

## 2013-08-25 DIAGNOSIS — Z87448 Personal history of other diseases of urinary system: Secondary | ICD-10-CM | POA: Insufficient documentation

## 2013-08-25 DIAGNOSIS — R300 Dysuria: Secondary | ICD-10-CM | POA: Insufficient documentation

## 2013-08-25 DIAGNOSIS — Z79899 Other long term (current) drug therapy: Secondary | ICD-10-CM | POA: Insufficient documentation

## 2013-08-25 DIAGNOSIS — M1990 Unspecified osteoarthritis, unspecified site: Secondary | ICD-10-CM | POA: Insufficient documentation

## 2013-08-25 DIAGNOSIS — I509 Heart failure, unspecified: Secondary | ICD-10-CM | POA: Insufficient documentation

## 2013-08-25 DIAGNOSIS — R1084 Generalized abdominal pain: Secondary | ICD-10-CM | POA: Insufficient documentation

## 2013-08-25 DIAGNOSIS — R109 Unspecified abdominal pain: Secondary | ICD-10-CM

## 2013-08-25 DIAGNOSIS — Z8619 Personal history of other infectious and parasitic diseases: Secondary | ICD-10-CM | POA: Insufficient documentation

## 2013-08-25 DIAGNOSIS — Z8701 Personal history of pneumonia (recurrent): Secondary | ICD-10-CM | POA: Insufficient documentation

## 2013-08-25 DIAGNOSIS — K219 Gastro-esophageal reflux disease without esophagitis: Secondary | ICD-10-CM | POA: Insufficient documentation

## 2013-08-25 DIAGNOSIS — Z90710 Acquired absence of both cervix and uterus: Secondary | ICD-10-CM | POA: Insufficient documentation

## 2013-08-25 DIAGNOSIS — Z9089 Acquired absence of other organs: Secondary | ICD-10-CM | POA: Insufficient documentation

## 2013-08-25 LAB — GLUCOSE, CAPILLARY
Glucose-Capillary: 46 mg/dL — ABNORMAL LOW (ref 70–99)
Glucose-Capillary: 90 mg/dL (ref 70–99)

## 2013-08-25 LAB — CBC WITH DIFFERENTIAL/PLATELET
Basophils Absolute: 0 10*3/uL (ref 0.0–0.1)
Basophils Relative: 0 % (ref 0–1)
Eosinophils Absolute: 0.3 10*3/uL (ref 0.0–0.7)
Eosinophils Relative: 2 % (ref 0–5)
HEMATOCRIT: 37.4 % (ref 36.0–46.0)
HEMOGLOBIN: 12.1 g/dL (ref 12.0–15.0)
LYMPHS ABS: 2.6 10*3/uL (ref 0.7–4.0)
LYMPHS PCT: 16 % (ref 12–46)
MCH: 30 pg (ref 26.0–34.0)
MCHC: 32.4 g/dL (ref 30.0–36.0)
MCV: 92.6 fL (ref 78.0–100.0)
MONO ABS: 2 10*3/uL — AB (ref 0.1–1.0)
MONOS PCT: 12 % (ref 3–12)
NEUTROS ABS: 11.5 10*3/uL — AB (ref 1.7–7.7)
Neutrophils Relative %: 70 % (ref 43–77)
Platelets: 434 10*3/uL — ABNORMAL HIGH (ref 150–400)
RBC: 4.04 MIL/uL (ref 3.87–5.11)
RDW: 14.5 % (ref 11.5–15.5)
WBC: 16.4 10*3/uL — AB (ref 4.0–10.5)

## 2013-08-25 LAB — URINE MICROSCOPIC-ADD ON

## 2013-08-25 LAB — COMPREHENSIVE METABOLIC PANEL
ALBUMIN: 3.3 g/dL — AB (ref 3.5–5.2)
ALK PHOS: 90 U/L (ref 39–117)
ALT: 13 U/L (ref 0–35)
AST: 22 U/L (ref 0–37)
BILIRUBIN TOTAL: 0.4 mg/dL (ref 0.3–1.2)
BUN: 68 mg/dL — AB (ref 6–23)
CHLORIDE: 102 meq/L (ref 96–112)
CO2: 25 meq/L (ref 19–32)
Calcium: 9.5 mg/dL (ref 8.4–10.5)
Creatinine, Ser: 3.3 mg/dL — ABNORMAL HIGH (ref 0.50–1.10)
GFR calc Af Amer: 16 mL/min — ABNORMAL LOW (ref >90)
GFR calc non Af Amer: 14 mL/min — ABNORMAL LOW (ref >90)
Glucose, Bld: 51 mg/dL — ABNORMAL LOW (ref 70–99)
POTASSIUM: 3.7 meq/L (ref 3.7–5.3)
Sodium: 144 mEq/L (ref 137–147)
Total Protein: 7 g/dL (ref 6.0–8.3)

## 2013-08-25 LAB — URINALYSIS, ROUTINE W REFLEX MICROSCOPIC
BILIRUBIN URINE: NEGATIVE
GLUCOSE, UA: NEGATIVE mg/dL
Hgb urine dipstick: NEGATIVE
KETONES UR: NEGATIVE mg/dL
Leukocytes, UA: NEGATIVE
Nitrite: NEGATIVE
Protein, ur: 100 mg/dL — AB
Specific Gravity, Urine: 1.008 (ref 1.005–1.030)
Urobilinogen, UA: 0.2 mg/dL (ref 0.0–1.0)
pH: 6 (ref 5.0–8.0)

## 2013-08-25 LAB — LIPASE, BLOOD: Lipase: 39 U/L (ref 11–59)

## 2013-08-25 MED ORDER — SODIUM CHLORIDE 0.9 % IV SOLN: INTRAVENOUS | Status: DC

## 2013-08-25 MED ORDER — ONDANSETRON HCL 4 MG/2ML IJ SOLN
4.0000 mg | Freq: Once | INTRAMUSCULAR | Status: AC
  Administered 2013-08-25: 4 mg via INTRAVENOUS
  Filled 2013-08-25: qty 2

## 2013-08-25 MED ORDER — HYDROMORPHONE HCL PF 1 MG/ML IJ SOLN
1.0000 mg | Freq: Once | INTRAMUSCULAR | Status: AC
  Administered 2013-08-25: 1 mg via INTRAVENOUS
  Filled 2013-08-25: qty 1

## 2013-08-25 MED ORDER — TRAMADOL HCL 50 MG PO TABS: 50.0000 mg | ORAL_TABLET | Freq: Four times a day (QID) | ORAL | Status: DC | PRN

## 2013-08-25 MED ORDER — SODIUM CHLORIDE 0.9 % IV BOLUS (SEPSIS)
250.0000 mL | Freq: Once | INTRAVENOUS | Status: AC
  Administered 2013-08-25: 250 mL via INTRAVENOUS

## 2013-08-25 MED ORDER — IOHEXOL 300 MG/ML  SOLN
50.0000 mL | Freq: Once | INTRAMUSCULAR | Status: AC | PRN
  Administered 2013-08-25: 50 mL via ORAL

## 2013-08-25 MED ORDER — ONDANSETRON 4 MG PO TBDP: 4.0000 mg | ORAL_TABLET | Freq: Three times a day (TID) | ORAL | Status: DC | PRN

## 2013-08-25 NOTE — ED Provider Notes (Signed)
CSN: [id number]     Arrival date & time 08/25/13  1458 History  This chart was scribed for [name] Kung, MD by Eugenia Mcalpine, ED Scribe. This patient was seen in room MH02/MH02 and the patient's care was started at 4:00 PM.   Chief Complaint  Patient presents with  . Abdominal Pain   (Consider location/radiation/quality/duration/timing/severity/associated sxs/prior Treatment) The history is provided by the patient. No language interpreter was used.   HPI Comments: [name] is a 68 y.o. female with a hx of COPD and diverticulitis who presents to the Emergency Department complaining of gradual onset constant dull aching generalized 10/10 abdominal pain with more pain in lower quadrant that does not radiate that started 3x days ago with associated nausea. She is concerned that it may be diverticulitis. She denies any vomiting or diarrhea. Pt has O2 at home on an as needed basis.  Her last episode of diverticulitis was dec of 2014. Pt has an appointment for her rash on her right forearm in 3 days time.   Noralee Space, MD  Past Medical History  Diagnosis Date  . Unspecified essential hypertension   . CAD (coronary artery disease) 01/04/2006    Tiny OM1 70-90% ostial stenosis, no other CAD  . Asthma   . Esophageal reflux   . Obesity, unspecified   . Anxiety state, unspecified   . Hypercalcemia   . Anemia   . Other psoriasis   . Diverticulosis   . Sarcoidosis 2012  . Dyslipidemia   . COPD (chronic obstructive pulmonary disease)   . Gout   . Colon polyps     Tubular adenomatous polyps  . Spinal stenosis of lumbar region   . Bulging lumbar disc   . CHF (congestive heart failure)   . PNA (pneumonia) 2012    With pleural effusion, requiring thoracentesis  . Pneumonia     "several times" (04/02/2013)  . Pleural effusion 2012  . Chronic bronchitis   . Exertional shortness of breath     "and lying flat" (04/02/2013)  . On home oxygen therapy     "2L when I'm up; 3L when I'm at  sleep" (04/02/2013)  . OSA (obstructive sleep apnea)     marginally compliant with CPAP  . Type II diabetes mellitus   . Degenerative joint disease   . Arthritis     'all over" (04/02/2013)  . Chronic lower back pain   . Kidney disease     baseline creatinie 2.0,  folowed by Dr Hassell Done  . Renal failure     "kidney's are working @ 50%" (04/02/2013)  . Diverticulosis   . GERD (gastroesophageal reflux disease)   . Anxiety   . Renal insufficiency    Past Surgical History  Procedure Laterality Date  . Vesicovaginal fistula closure w/  total abdominal hysterectomy  1992  . Bilateral total knee replacements Bilateral Rt=5/04 & Lft=1/09    by DrAlusio  . 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 Zella Richer  . Appendectomy  1957  . Tonsillectomy  1968  . Cholecystectomy  1980's  . Abdominal hysterectomy  1992  . Reduction mammaplasty Bilateral 1980's  . Cardiac catheterization  1990's  . Thoracentesis  2012   Family History  Problem Relation Age of Onset  . Diabetes Mother   . Breast cancer Maternal Aunt    History  Substance Use Topics  . Smoking status: Never Smoker   . Smokeless tobacco: Never Used  . Alcohol Use: No  OB History   Grav Para Term Preterm Abortions TAB SAB Ect Mult Living                 Review of Systems  Constitutional: Negative for fever and chills.  HENT: Negative for rhinorrhea and sore throat.   Eyes: Negative for visual disturbance.  Respiratory: Positive for cough and shortness of breath.   Cardiovascular: Negative for chest pain and leg swelling.  Gastrointestinal: Positive for nausea and abdominal pain. Negative for vomiting and diarrhea.  Genitourinary: Positive for dysuria.  Skin: Positive for rash.  Neurological: Negative for dizziness and headaches.  Hematological: Does not bruise/bleed easily.  Psychiatric/Behavioral: Negative for confusion.  All other systems reviewed and are negative.    Allergies  Adhesive; Avelox; Codeine; Guaifenesin;  Latex; and Oxycodone  Home Medications   Current Outpatient Rx  Name  Route  Sig  Dispense  Refill  . albuterol (PROVENTIL HFA;VENTOLIN HFA) 108 (90 BASE) MCG/ACT inhaler   Inhalation   Inhale 2 puffs into the lungs every 6 (six) hours as needed for wheezing or shortness of breath.         . Alum & Mag Hydroxide-Simeth (MAGIC MOUTHWASH) SOLN      1 tsp 4 times daily gargle and swallow   120 mL   0   . aspirin EC 81 MG tablet   Oral   Take 81 mg by mouth daily.         Marland Kitchen atorvastatin (LIPITOR) 20 MG tablet   Oral   Take 1 tablet (20 mg total) by mouth daily.   90 tablet   3   . dicyclomine (BENTYL) 10 MG capsule   Oral   Take 10 mg by mouth 4 (four) times daily -  before meals and at bedtime.         Marland Kitchen esomeprazole (NEXIUM) 40 MG capsule   Oral   Take 1 capsule (40 mg total) by mouth 2 (two) times daily.   90 capsule   3   . ferrous sulfate 325 (65 FE) MG tablet   Oral   Take 325 mg by mouth daily with breakfast.         . fluticasone (FLONASE) 50 MCG/ACT nasal spray   Nasal   Place 2 sprays into the nose at bedtime.   48 g   3   . Fluticasone-Salmeterol (ADVAIR) 250-50 MCG/DOSE AEPB   Inhalation   Inhale 1 puff into the lungs 2 (two) times daily.   3 each   3   . hydrALAZINE (APRESOLINE) 25 MG tablet   Oral   Take 1 tablet (25 mg total) by mouth 3 (three) times daily.   270 tablet   1   . HYDROcodone-acetaminophen (NORCO/VICODIN) 5-325 MG per tablet   Oral   Take 2 tablets by mouth every 4 (four) hours as needed for pain.   20 tablet   0   . HYDROcodone-homatropine (HYCODAN) 5-1.5 MG/5ML syrup   Oral   Take 5 mLs by mouth every 6 (six) hours as needed for cough.   140 mL   0   . hydrOXYzine (ATARAX/VISTARIL) 25 MG tablet   Oral   Take 1 tablet (25 mg total) by mouth every 4 (four) hours as needed.   100 tablet   5   . insulin detemir (LEVEMIR) 100 UNIT/ML injection   Subcutaneous   Inject 56 Units into the skin 2 (two) times daily.           Marland Kitchen  insulin lispro (HUMALOG KWIKPEN) 100 UNIT/ML SOPN      Take 24 units in the morning, 34 units in the afternoon and 30 units in the evening.         . isosorbide dinitrate (ISORDIL) 20 MG tablet   Oral   Take 1 tablet (20 mg total) by mouth 3 (three) times daily.   270 tablet   1   . ketorolac (ACULAR LS) 0.4 % SOLN   Both Eyes   Place 1 drop into both eyes 3 (three) times daily.          . Lancets MISC   Does not apply   1 Units by Does not apply route 4 (four) times daily - after meals and at bedtime.         . Linaclotide (LINZESS) 290 MCG CAPS capsule   Oral   Take 1 capsule (290 mcg total) by mouth daily.   30 capsule   3   . LORazepam (ATIVAN) 1 MG tablet      Take 1/2 to 1 tablet by mouth three times daily as needed   50 tablet   5   . metoprolol tartrate (LOPRESSOR) 25 MG tablet   Oral   Take 0.5 tablets (12.5 mg total) by mouth 2 (two) times daily.   90 tablet   3   . ondansetron (ZOFRAN ODT) 4 MG disintegrating tablet   Oral   Take 1 tablet (4 mg total) by mouth every 8 (eight) hours as needed for nausea or vomiting.   12 tablet   1   . predniSONE (DELTASONE) 5 MG tablet   Oral   Take 5 mg by mouth daily with breakfast.         . pregabalin (LYRICA) 100 MG capsule   Oral   Take 1 capsule (100 mg total) by mouth every evening.   90 capsule   3   . torsemide (DEMADEX) 20 MG tablet   Oral   Take 2 tablets (40 mg total) by mouth daily.   60 tablet   3   . traMADol (ULTRAM) 50 MG tablet   Oral   Take 1 tablet (50 mg total) by mouth every 6 (six) hours as needed.   20 tablet   0    BP 155/57  Pulse 64  Temp(Src) 97.8 F (36.6 C)  Resp 16  Ht 5\' 2"  (1.575 m)  Wt 212 lb (96.163 kg)  BMI 38.77 kg/m2  SpO2 100% Physical Exam  Nursing note and vitals reviewed. Constitutional: She is oriented to person, place, and time. She appears well-developed and well-nourished. No distress.  HENT:  Head: Normocephalic and  atraumatic.  Eyes: EOM are normal.  Neck: Neck supple. No tracheal deviation present.  Cardiovascular: Normal rate and regular rhythm.   Pulmonary/Chest: Effort normal. No respiratory distress.  Abdominal: Soft. There is tenderness (diffusely). There is no guarding.  Musculoskeletal: Normal range of motion. She exhibits no edema.  Neurological: She is alert and oriented to person, place, and time. No cranial nerve deficit. She exhibits normal muscle tone. Coordination normal.  Skin: Skin is warm and dry.  Psychiatric: She has a normal mood and affect. Her behavior is normal.    ED Course  Procedures (including critical care time)  DIAGNOSTIC STUDIES: Oxygen Saturation is 98% on RA, normal by my interpretation.    COORDINATION OF CARE: 4:04 PM- Pt advised of plan for treatment including CT scan of her abdomen and pt agrees.  Labs Review Labs Reviewed  CBC WITH DIFFERENTIAL - Abnormal; Notable for the following:    WBC 16.4 (*)    Platelets 434 (*)    Neutro Abs 11.5 (*)    Monocytes Absolute 2.0 (*)    All other components within normal limits  COMPREHENSIVE METABOLIC PANEL - Abnormal; Notable for the following:    Glucose, Bld 51 (*)    BUN 68 (*)    Creatinine, Ser 3.30 (*)    Albumin 3.3 (*)    GFR calc non Af Amer 14 (*)    GFR calc Af Amer 16 (*)    All other components within normal limits  URINALYSIS, ROUTINE W REFLEX MICROSCOPIC - Abnormal; Notable for the following:    Protein, ur 100 (*)    All other components within normal limits  GLUCOSE, CAPILLARY - Abnormal; Notable for the following:    Glucose-Capillary 46 (*)    All other components within normal limits  LIPASE, BLOOD  GLUCOSE, CAPILLARY  URINE MICROSCOPIC-ADD ON   Imaging Review Ct Abdomen Pelvis Wo Contrast  08/25/2013   CLINICAL DATA:  Abdominal pain.  Nausea.  Renal failure.  EXAM: CT ABDOMEN AND PELVIS WITHOUT CONTRAST  TECHNIQUE: Multidetector CT imaging of the abdomen and pelvis was  performed following the standard protocol without intravenous contrast.  COMPARISON:  07/21/12  FINDINGS: Small low-attenuation lesion with peripheral calcification in the posterior dome of the right hepatic lobe is stable and consistent with a benign etiology. No other liver lesions seen on this noncontrast study. Surgical clips from prior cholecystectomy noted. Noncontrast images of the pancreas, adrenal glands, and kidneys are normal in appearance. No evidence hydronephrosis. Prior splenectomy again noted with several small accessory splenules seen in the splenectomy bed.  Prior hysterectomy noted. Adnexal regions are unremarkable in appearance. No soft tissue masses or lymphadenopathy identified. Diverticulosis again seen predominately involving the sigmoid colon, however there is no evidence of diverticulitis. No other inflammatory process or abnormal fluid collections are identified. No evidence bowel obstruction.  IMPRESSION: Stable exam.  No acute findings.  Diverticulosis. No radiographic evidence of diverticulitis.   Electronically Signed   By: Earle Gell M.D.   On: 08/25/2013 19:24    EKG Interpretation   None       MDM   1. Abdominal pain    Patient with abdominal pain this started 3 days ago. Workup negative labs all normal including liver function test and lipase with the exception of some leukocytosis. No evidence urinary tract infection. CT scan of the abdomen shows no acute abnormality. Patient does have a history of diverticulosis but no evidence of diverticulitis. Patient has primary care Dr. to followup with. Patient we discharged home with mild pain medicine tramadol and antinausea medicine. Patient encouraged to do a small meals frequently. Patient has required some snacks here.  I personally performed the services described in this documentation, which was scribed in my presence. The recorded information has been reviewed and is accurate.     [name] Kung,  MD 08/25/13 [phone]

## 2013-08-25 NOTE — ED Notes (Signed)
History of diverticulitis.  Ate cabbage Tuesday night, onset of abdominal pain Wed morning.  C/o nausea, no vomiting or diarrhea.

## 2013-08-25 NOTE — Discharge Instructions (Signed)
If not better in a few days. Make an appointment to followup with your Dr. Return for any new or worse symptoms. Take Zofran as needed for nausea and vomiting. Take the tramadol as needed for pain. Today's workup was negative. Is important that she do take food in small amounts frequently.

## 2013-09-14 ENCOUNTER — Telehealth (HOSPITAL_COMMUNITY): Payer: Self-pay

## 2013-09-14 NOTE — Telephone Encounter (Signed)
I have called and left a message x2 with [facility] to inquire about participation in Pulmonary Rehab. Will send letter in mail and follow up.

## 2013-09-17 ENCOUNTER — Telehealth (HOSPITAL_COMMUNITY): Payer: Self-pay

## 2013-09-17 NOTE — Telephone Encounter (Signed)
Patient called and said she could not afford her insurances co pay for Pulmonary Rehab.  Patient is encouraged to call us back in the future if her insurance situation ever changes.

## 2013-10-01 ENCOUNTER — Telehealth: Payer: Self-pay | Admitting: Pulmonary Disease

## 2013-10-01 NOTE — Telephone Encounter (Signed)
Called and spoke with pt and she is wanting to know if she should be taking the clonidine, hydralazine and the furosemide along with torsemide.    Pt has not been taking the furosemide in the last week or so, she has only been taking the clonidine and the hydralazine and the torsemide.  She has been out of the furosemide.  She was not sure if she needed to be taking 2 fluid pills.  She did check her BP today and it was 162/82.  SN please advise. Thanks  Allergies  Allergen Reactions  . Adhesive [Tape] Other (See Comments)    Blisters   . Avelox [Moxifloxacin Hcl In Nacl]     GI upset  . Codeine Other (See Comments)    "crazy"   . Guaifenesin Nausea And Vomiting  . Latex Swelling  . Oxycodone Nausea And Vomiting     Current Outpatient Prescriptions on File Prior to Visit  Medication Sig Dispense Refill  . ADVAIR DISKUS 250-50 MCG/DOSE AEPB inhale 1 dose by mouth twice a day  60 each  11  . albuterol (PROVENTIL HFA;VENTOLIN HFA) 108 (90 BASE) MCG/ACT inhaler Inhale 2 puffs into the lungs every 6 (six) hours as needed for wheezing or shortness of breath.      . Alum & Mag Hydroxide-Simeth (MAGIC MOUTHWASH) SOLN 1 tsp 4 times daily gargle and swallow  120 mL  0  . aspirin EC 81 MG tablet Take 81 mg by mouth daily.      Marland Kitchen atorvastatin (LIPITOR) 20 MG tablet Take 1 tablet (20 mg total) by mouth daily.  90 tablet  3  . dicyclomine (BENTYL) 10 MG capsule Take 10 mg by mouth 4 (four) times daily -  before meals and at bedtime.      Marland Kitchen esomeprazole (NEXIUM) 40 MG capsule Take 1 capsule (40 mg total) by mouth 2 (two) times daily.  90 capsule  3  . ferrous sulfate 325 (65 FE) MG tablet Take 325 mg by mouth daily with breakfast.      . fluticasone (FLONASE) 50 MCG/ACT nasal spray Place 2 sprays into the nose at bedtime.  48 g  3  . Fluticasone-Salmeterol (ADVAIR) 250-50 MCG/DOSE AEPB Inhale 1 puff into the lungs 2 (two) times daily.  3 each  3  . hydrALAZINE (APRESOLINE) 25 MG tablet Take 1 tablet  (25 mg total) by mouth 3 (three) times daily.  270 tablet  1  . HYDROcodone-acetaminophen (NORCO/VICODIN) 5-325 MG per tablet Take 2 tablets by mouth every 4 (four) hours as needed for pain.  20 tablet  0  . HYDROcodone-homatropine (HYCODAN) 5-1.5 MG/5ML syrup Take 5 mLs by mouth every 6 (six) hours as needed for cough.  140 mL  0  . hydrOXYzine (ATARAX/VISTARIL) 25 MG tablet Take 1 tablet (25 mg total) by mouth every 4 (four) hours as needed.  100 tablet  5  . insulin detemir (LEVEMIR) 100 UNIT/ML injection Inject 56 Units into the skin 2 (two) times daily.       . insulin lispro (HUMALOG KWIKPEN) 100 UNIT/ML SOPN Take 24 units in the morning, 34 units in the afternoon and 30 units in the evening.      . isosorbide dinitrate (ISORDIL) 20 MG tablet Take 1 tablet (20 mg total) by mouth 3 (three) times daily.  270 tablet  1  . ketorolac (ACULAR LS) 0.4 % SOLN Place 1 drop into both eyes 3 (three) times daily.       . Lancets  MISC 1 Units by Does not apply route 4 (four) times daily - after meals and at bedtime.      . Linaclotide (LINZESS) 290 MCG CAPS capsule Take 1 capsule (290 mcg total) by mouth daily.  30 capsule  3  . LORazepam (ATIVAN) 1 MG tablet Take 1/2 to 1 tablet by mouth three times daily as needed  50 tablet  5  . metoprolol tartrate (LOPRESSOR) 25 MG tablet Take 0.5 tablets (12.5 mg total) by mouth 2 (two) times daily.  90 tablet  3  . ondansetron (ZOFRAN ODT) 4 MG disintegrating tablet Take 1 tablet (4 mg total) by mouth every 8 (eight) hours as needed for nausea or vomiting.  12 tablet  1  . predniSONE (DELTASONE) 5 MG tablet Take 5 mg by mouth daily with breakfast.      . pregabalin (LYRICA) 100 MG capsule Take 1 capsule (100 mg total) by mouth every evening.  90 capsule  3  . torsemide (DEMADEX) 20 MG tablet Take 2 tablets (40 mg total) by mouth daily.  60 tablet  3  . traMADol (ULTRAM) 50 MG tablet Take 1 tablet (50 mg total) by mouth every 6 (six) hours as needed.  20 tablet  0    No current facility-administered medications on file prior to visit.

## 2013-10-01 NOTE — Telephone Encounter (Signed)
Per SN---  This is correct.  Pt should be taking the clonidine, hydralazine and the torsemide.  Called and spoke with pt and she stated that these are the meds that she has been taking and will cont to take. Nothing further is needed.

## 2013-10-08 ENCOUNTER — Other Ambulatory Visit: Payer: Self-pay | Admitting: Pulmonary Disease

## 2013-10-08 MED ORDER — PREGABALIN 100 MG PO CAPS: 100.0000 mg | ORAL_CAPSULE | Freq: Every evening | ORAL | Status: DC

## 2013-10-08 NOTE — Telephone Encounter (Signed)
Received fax from [facility] for refill on pt's Lyrica 100mg  1 capsule po QPM  #90 Form filled out with rx specifics, with 1 additional refill as SN will be retiring from [facility] as of April 1 Form needs to be signed by [facility] and faxed to [place] @ [phone] Pt's med list updated

## 2013-10-10 ENCOUNTER — Telehealth: Payer: Self-pay | Admitting: Pulmonary Disease

## 2013-10-10 NOTE — Telephone Encounter (Signed)
Dr Henrene Pastor is her GI doctor, she should contact his office asap - if condition worsens while waiting to be seen, should go to ER

## 2013-10-10 NOTE — Telephone Encounter (Signed)
Spoke with pt and advised that Lyrica was called in to [facility] on 10/08/13 and it may still be a few more days before she receives this.  Also, pt c/o nausea for past 3 days - Stomach hurting - Some diarrhea (Pepto has helped this) - Dry heaves - Denies vomiting - Pt has only been on clear liquids for past 3 days - Pt taking Nexium bid - Was unable to tolerate Bentyl - Please advise Sending to doc of day

## 2013-10-10 NOTE — Telephone Encounter (Signed)
Called spoke with patient and advised of MW's recs as below Pt okay with this and verbalized her understanding to call Dr Blanch Media ASAP Will sign off.

## 2013-10-14 ENCOUNTER — Encounter (HOSPITAL_COMMUNITY): Payer: Self-pay | Admitting: Emergency Medicine

## 2013-10-14 ENCOUNTER — Emergency Department (HOSPITAL_COMMUNITY)
Admission: EM | Admit: 2013-10-14 | Discharge: 2013-10-14 | Disposition: A | Discharge status: Home / Self Care | Payer: Medicare Other | Attending: Emergency Medicine | Admitting: Emergency Medicine

## 2013-10-14 ENCOUNTER — Emergency Department (HOSPITAL_COMMUNITY): Payer: Medicare Other

## 2013-10-14 ENCOUNTER — Emergency Department (HOSPITAL_COMMUNITY)
Admission: EM | Admit: 2013-10-14 | Discharge: 2013-10-14 | Disposition: A | Discharge status: Hospice | Payer: Medicare Other | Source: Home / Self Care | Attending: Family Medicine | Admitting: Family Medicine

## 2013-10-14 DIAGNOSIS — D849 Immunodeficiency, unspecified: Secondary | ICD-10-CM | POA: Insufficient documentation

## 2013-10-14 DIAGNOSIS — Z9089 Acquired absence of other organs: Secondary | ICD-10-CM | POA: Insufficient documentation

## 2013-10-14 DIAGNOSIS — Z860100 Personal history of colon polyps, unspecified: Secondary | ICD-10-CM | POA: Insufficient documentation

## 2013-10-14 DIAGNOSIS — E785 Hyperlipidemia, unspecified: Secondary | ICD-10-CM | POA: Insufficient documentation

## 2013-10-14 DIAGNOSIS — D649 Anemia, unspecified: Secondary | ICD-10-CM | POA: Insufficient documentation

## 2013-10-14 DIAGNOSIS — R1110 Vomiting, unspecified: Secondary | ICD-10-CM

## 2013-10-14 DIAGNOSIS — Z9889 Other specified postprocedural states: Secondary | ICD-10-CM | POA: Insufficient documentation

## 2013-10-14 DIAGNOSIS — Z9981 Dependence on supplemental oxygen: Secondary | ICD-10-CM | POA: Insufficient documentation

## 2013-10-14 DIAGNOSIS — G4733 Obstructive sleep apnea (adult) (pediatric): Secondary | ICD-10-CM | POA: Insufficient documentation

## 2013-10-14 DIAGNOSIS — Z87448 Personal history of other diseases of urinary system: Secondary | ICD-10-CM | POA: Insufficient documentation

## 2013-10-14 DIAGNOSIS — I10 Essential (primary) hypertension: Secondary | ICD-10-CM | POA: Insufficient documentation

## 2013-10-14 DIAGNOSIS — K529 Noninfective gastroenteritis and colitis, unspecified: Secondary | ICD-10-CM

## 2013-10-14 DIAGNOSIS — M109 Gout, unspecified: Secondary | ICD-10-CM | POA: Insufficient documentation

## 2013-10-14 DIAGNOSIS — I2510 Atherosclerotic heart disease of native coronary artery without angina pectoris: Secondary | ICD-10-CM | POA: Insufficient documentation

## 2013-10-14 DIAGNOSIS — Z872 Personal history of diseases of the skin and subcutaneous tissue: Secondary | ICD-10-CM | POA: Insufficient documentation

## 2013-10-14 DIAGNOSIS — F411 Generalized anxiety disorder: Secondary | ICD-10-CM | POA: Insufficient documentation

## 2013-10-14 DIAGNOSIS — Z8619 Personal history of other infectious and parasitic diseases: Secondary | ICD-10-CM | POA: Insufficient documentation

## 2013-10-14 DIAGNOSIS — M1990 Unspecified osteoarthritis, unspecified site: Secondary | ICD-10-CM | POA: Insufficient documentation

## 2013-10-14 DIAGNOSIS — K219 Gastro-esophageal reflux disease without esophagitis: Secondary | ICD-10-CM | POA: Insufficient documentation

## 2013-10-14 DIAGNOSIS — E669 Obesity, unspecified: Secondary | ICD-10-CM | POA: Insufficient documentation

## 2013-10-14 DIAGNOSIS — J4489 Other specified chronic obstructive pulmonary disease: Secondary | ICD-10-CM | POA: Insufficient documentation

## 2013-10-14 DIAGNOSIS — E119 Type 2 diabetes mellitus without complications: Secondary | ICD-10-CM | POA: Insufficient documentation

## 2013-10-14 DIAGNOSIS — Z79899 Other long term (current) drug therapy: Secondary | ICD-10-CM | POA: Insufficient documentation

## 2013-10-14 DIAGNOSIS — Z91040 Latex allergy status: Secondary | ICD-10-CM | POA: Insufficient documentation

## 2013-10-14 DIAGNOSIS — G8929 Other chronic pain: Secondary | ICD-10-CM | POA: Insufficient documentation

## 2013-10-14 DIAGNOSIS — Z7982 Long term (current) use of aspirin: Secondary | ICD-10-CM | POA: Insufficient documentation

## 2013-10-14 DIAGNOSIS — J449 Chronic obstructive pulmonary disease, unspecified: Secondary | ICD-10-CM | POA: Insufficient documentation

## 2013-10-14 DIAGNOSIS — K5289 Other specified noninfective gastroenteritis and colitis: Secondary | ICD-10-CM | POA: Insufficient documentation

## 2013-10-14 DIAGNOSIS — R197 Diarrhea, unspecified: Principal | ICD-10-CM

## 2013-10-14 DIAGNOSIS — Z794 Long term (current) use of insulin: Secondary | ICD-10-CM | POA: Insufficient documentation

## 2013-10-14 DIAGNOSIS — I509 Heart failure, unspecified: Secondary | ICD-10-CM | POA: Insufficient documentation

## 2013-10-14 DIAGNOSIS — Z8701 Personal history of pneumonia (recurrent): Secondary | ICD-10-CM | POA: Insufficient documentation

## 2013-10-14 DIAGNOSIS — IMO0002 Reserved for concepts with insufficient information to code with codable children: Secondary | ICD-10-CM | POA: Insufficient documentation

## 2013-10-14 LAB — URINE MICROSCOPIC-ADD ON

## 2013-10-14 LAB — COMPREHENSIVE METABOLIC PANEL
ALT: 27 U/L (ref 0–35)
AST: 25 U/L (ref 0–37)
Albumin: 3.6 g/dL (ref 3.5–5.2)
Alkaline Phosphatase: 136 U/L — ABNORMAL HIGH (ref 39–117)
BUN: 61 mg/dL — ABNORMAL HIGH (ref 6–23)
CHLORIDE: 93 meq/L — AB (ref 96–112)
CO2: 26 mEq/L (ref 19–32)
Calcium: 10.3 mg/dL (ref 8.4–10.5)
Creatinine, Ser: 2.92 mg/dL — ABNORMAL HIGH (ref 0.50–1.10)
GFR calc Af Amer: 18 mL/min — ABNORMAL LOW (ref >90)
GFR calc non Af Amer: 16 mL/min — ABNORMAL LOW (ref >90)
Glucose, Bld: 211 mg/dL — ABNORMAL HIGH (ref 70–99)
POTASSIUM: 3.4 meq/L — AB (ref 3.7–5.3)
Sodium: 138 mEq/L (ref 137–147)
Total Bilirubin: 0.5 mg/dL (ref 0.3–1.2)
Total Protein: 7.9 g/dL (ref 6.0–8.3)

## 2013-10-14 LAB — CBC WITH DIFFERENTIAL/PLATELET
BASOS PCT: 0 % (ref 0–1)
Basophils Absolute: 0 10*3/uL (ref 0.0–0.1)
EOS PCT: 2 % (ref 0–5)
Eosinophils Absolute: 0.3 10*3/uL (ref 0.0–0.7)
HEMATOCRIT: 44 % (ref 36.0–46.0)
Hemoglobin: 14.8 g/dL (ref 12.0–15.0)
LYMPHS PCT: 23 % (ref 12–46)
Lymphs Abs: 3.4 10*3/uL (ref 0.7–4.0)
MCH: 29.2 pg (ref 26.0–34.0)
MCHC: 33.6 g/dL (ref 30.0–36.0)
MCV: 87 fL (ref 78.0–100.0)
MONO ABS: 2.1 10*3/uL — AB (ref 0.1–1.0)
Monocytes Relative: 14 % — ABNORMAL HIGH (ref 3–12)
Neutro Abs: 9 10*3/uL — ABNORMAL HIGH (ref 1.7–7.7)
Neutrophils Relative %: 60 % (ref 43–77)
Platelets: 484 10*3/uL — ABNORMAL HIGH (ref 150–400)
RBC: 5.06 MIL/uL (ref 3.87–5.11)
RDW: 13.1 % (ref 11.5–15.5)
WBC: 14.8 10*3/uL — ABNORMAL HIGH (ref 4.0–10.5)

## 2013-10-14 LAB — URINALYSIS, ROUTINE W REFLEX MICROSCOPIC
BILIRUBIN URINE: NEGATIVE
Glucose, UA: 100 mg/dL — AB
Hgb urine dipstick: NEGATIVE
KETONES UR: NEGATIVE mg/dL
Leukocytes, UA: NEGATIVE
Nitrite: NEGATIVE
PH: 5 (ref 5.0–8.0)
Protein, ur: >300 mg/dL — AB
Specific Gravity, Urine: 1.024 (ref 1.005–1.030)
Urobilinogen, UA: 0.2 mg/dL (ref 0.0–1.0)

## 2013-10-14 LAB — LIPASE, BLOOD: Lipase: 36 U/L (ref 11–59)

## 2013-10-14 MED ORDER — ONDANSETRON HCL 4 MG/2ML IJ SOLN
4.0000 mg | Freq: Once | INTRAMUSCULAR | Status: AC
  Administered 2013-10-14: 4 mg via INTRAVENOUS
  Filled 2013-10-14: qty 2

## 2013-10-14 MED ORDER — POTASSIUM CHLORIDE CRYS ER 20 MEQ PO TBCR
40.0000 meq | EXTENDED_RELEASE_TABLET | Freq: Once | ORAL | Status: AC
  Administered 2013-10-14: 40 meq via ORAL
  Filled 2013-10-14: qty 2

## 2013-10-14 MED ORDER — ONDANSETRON HCL 8 MG PO TABS: 8.0000 mg | ORAL_TABLET | Freq: Three times a day (TID) | ORAL | Status: DC | PRN

## 2013-10-14 MED ORDER — SODIUM CHLORIDE 0.9 % IV BOLUS (SEPSIS)
1000.0000 mL | Freq: Once | INTRAVENOUS | Status: AC
  Administered 2013-10-14: 1000 mL via INTRAVENOUS

## 2013-10-14 MED ORDER — METOCLOPRAMIDE HCL 5 MG/ML IJ SOLN
5.0000 mg | Freq: Once | INTRAMUSCULAR | Status: AC
  Administered 2013-10-14: 5 mg via INTRAVENOUS
  Filled 2013-10-14: qty 2

## 2013-10-14 NOTE — ED Notes (Signed)
Pt given ice water by EDP.

## 2013-10-14 NOTE — ED Notes (Signed)
Pt in radiology 

## 2013-10-14 NOTE — ED Notes (Signed)
C/O CONSTIPATION AND DIARRHEA STATES STOMACH IS UPSET AND PAINFUL otc MEDICATIONS TAKEN

## 2013-10-14 NOTE — ED Provider Notes (Signed)
CSN: [id number]     Arrival date & time 10/14/13  1527 History   First MD Initiated Contact with Patient 10/14/13 1542     Chief Complaint  Patient presents with  . Abdominal Pain  . Emesis  . Diarrhea     (Consider location/radiation/quality/duration/timing/severity/associated sxs/prior Treatment) Patient is a 68 y.o. female presenting with abdominal pain, vomiting, and diarrhea.  Abdominal Pain Associated symptoms: diarrhea, nausea and vomiting   Emesis Associated symptoms: abdominal pain and diarrhea   Diarrhea Associated symptoms: abdominal pain and vomiting    Complains of vomiting diarrhea for approximately one week. Last episode of vomiting one time today. He is of nausea. Had 3 episodes of diarrhea today. Also complains of diffuse crampy abdominal pain presently mild.. Other associated symptoms include generalized weakness, feels dehydrated. No other associated symptoms. Nothing makes symptoms better or worse. No fever. No recent travel. No recent use of antibiotics. Treated herself with Emetrol, no relief. Abdominal Discomfort is presently minimal. No hematemesis no blood per rectum Past Medical History  Diagnosis Date  . Unspecified essential hypertension   . CAD (coronary artery disease) 01/04/2006    Tiny OM1 70-90% ostial stenosis, no other CAD  . Asthma   . Esophageal reflux   . Obesity, unspecified   . Anxiety state, unspecified   . Hypercalcemia   . Anemia   . Other psoriasis   . Diverticulosis   . Sarcoidosis 2012  . Dyslipidemia   . COPD (chronic obstructive pulmonary disease)   . Gout   . Colon polyps     Tubular adenomatous polyps  . Spinal stenosis of lumbar region   . Bulging lumbar disc   . CHF (congestive heart failure)   . PNA (pneumonia) 2012    With pleural effusion, requiring thoracentesis  . Pneumonia     "several times" (04/02/2013)  . Pleural effusion 2012  . Chronic bronchitis   . Exertional shortness of breath     "and lying flat"  (04/02/2013)  . On home oxygen therapy     "2L when I'm up; 3L when I'm at sleep" (04/02/2013)  . OSA (obstructive sleep apnea)     marginally compliant with CPAP  . Type II diabetes mellitus   . Degenerative joint disease   . Arthritis     'all over" (04/02/2013)  . Chronic lower back pain   . Kidney disease     baseline creatinie 2.0,  folowed by Dr Hassell Done  . Renal failure     "kidney's are working @ 50%" (04/02/2013)  . Diverticulosis   . GERD (gastroesophageal reflux disease)   . Anxiety   . Renal insufficiency    Past Surgical History  Procedure Laterality Date  . Vesicovaginal fistula closure w/  total abdominal hysterectomy  1992  . Bilateral total knee replacements Bilateral Rt=5/04 & Lft=1/09    by DrAlusio  . 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 Zella Richer  . Appendectomy  1957  . Tonsillectomy  1968  . Cholecystectomy  1980's  . Abdominal hysterectomy  1992  . Reduction mammaplasty Bilateral 1980's  . Cardiac catheterization  1990's  . Thoracentesis  2012   Family History  Problem Relation Age of Onset  . Diabetes Mother   . Breast cancer Maternal Aunt    History  Substance Use Topics  . Smoking status: Never Smoker   . Smokeless tobacco: Never Used  . Alcohol Use: No   OB History   Grav Para Term Preterm Abortions TAB  SAB Ect Mult Living                 Review of Systems  HENT: Negative.   Respiratory: Negative.   Cardiovascular: Negative.   Gastrointestinal: Positive for nausea, vomiting, abdominal pain and diarrhea.  Musculoskeletal: Negative.   Skin: Negative.   Allergic/Immunologic: Positive for immunocompromised state.       Status post splenectomy  Neurological: Positive for weakness.  Psychiatric/Behavioral: Negative.   All other systems reviewed and are negative.      Allergies  Adhesive; Avelox; Codeine; Guaifenesin; Latex; and Oxycodone  Home Medications   Current Outpatient Rx  Name  Route  Sig  Dispense  Refill  . ADVAIR DISKUS  250-50 MCG/DOSE AEPB      inhale 1 dose by mouth twice a day   60 each   11   . albuterol (PROVENTIL HFA;VENTOLIN HFA) 108 (90 BASE) MCG/ACT inhaler   Inhalation   Inhale 2 puffs into the lungs every 6 (six) hours as needed for wheezing or shortness of breath.         . Alum & Mag Hydroxide-Simeth (MAGIC MOUTHWASH) SOLN      1 tsp 4 times daily gargle and swallow   120 mL   0   . aspirin EC 81 MG tablet   Oral   Take 81 mg by mouth daily.         Marland Kitchen atorvastatin (LIPITOR) 20 MG tablet   Oral   Take 1 tablet (20 mg total) by mouth daily.   90 tablet   3   . esomeprazole (NEXIUM) 40 MG capsule   Oral   Take 1 capsule (40 mg total) by mouth 2 (two) times daily.   90 capsule   3   . ferrous sulfate 325 (65 FE) MG tablet   Oral   Take 325 mg by mouth daily with breakfast.         . fluticasone (FLONASE) 50 MCG/ACT nasal spray   Nasal   Place 2 sprays into the nose at bedtime.   48 g   3   . hydrALAZINE (APRESOLINE) 25 MG tablet   Oral   Take 1 tablet (25 mg total) by mouth 3 (three) times daily.   270 tablet   1   . HYDROcodone-acetaminophen (NORCO/VICODIN) 5-325 MG per tablet   Oral   Take 2 tablets by mouth every 4 (four) hours as needed for pain.   20 tablet   0   . HYDROcodone-homatropine (HYCODAN) 5-1.5 MG/5ML syrup   Oral   Take 5 mLs by mouth every 6 (six) hours as needed for cough.   140 mL   0   . hydrOXYzine (ATARAX/VISTARIL) 25 MG tablet   Oral   Take 1 tablet (25 mg total) by mouth every 4 (four) hours as needed.   100 tablet   5   . insulin detemir (LEVEMIR) 100 UNIT/ML injection   Subcutaneous   Inject 56 Units into the skin 2 (two) times daily.          . insulin lispro (HUMALOG KWIKPEN) 100 UNIT/ML SOPN      Take 24 units in the morning, 34 units in the afternoon and 30 units in the evening.         . isosorbide dinitrate (ISORDIL) 20 MG tablet   Oral   Take 1 tablet (20 mg total) by mouth 3 (three) times daily.   270  tablet   1   . ketorolac (  ACULAR LS) 0.4 % SOLN   Both Eyes   Place 1 drop into both eyes 3 (three) times daily.          . Lancets MISC   Does not apply   1 Units by Does not apply route 4 (four) times daily - after meals and at bedtime.         . Linaclotide (LINZESS) 290 MCG CAPS capsule   Oral   Take 1 capsule (290 mcg total) by mouth daily.   30 capsule   3   . LORazepam (ATIVAN) 1 MG tablet      Take 1/2 to 1 tablet by mouth three times daily as needed   50 tablet   5   . metoprolol tartrate (LOPRESSOR) 25 MG tablet   Oral   Take 0.5 tablets (12.5 mg total) by mouth 2 (two) times daily.   90 tablet   3   . ondansetron (ZOFRAN ODT) 4 MG disintegrating tablet   Oral   Take 1 tablet (4 mg total) by mouth every 8 (eight) hours as needed for nausea or vomiting.   12 tablet   1   . predniSONE (DELTASONE) 5 MG tablet   Oral   Take 5 mg by mouth daily with breakfast.         . pregabalin (LYRICA) 100 MG capsule   Oral   Take 1 capsule (100 mg total) by mouth every evening.   90 capsule   1   . torsemide (DEMADEX) 20 MG tablet   Oral   Take 2 tablets (40 mg total) by mouth daily.   60 tablet   3   . traMADol (ULTRAM) 50 MG tablet   Oral   Take 1 tablet (50 mg total) by mouth every 6 (six) hours as needed.   20 tablet   0    BP 152/60  Pulse 67  Temp(Src) 97.6 F (36.4 C) (Oral)  Resp 19  Wt 202 lb (91.627 kg)  SpO2 100% Physical Exam  Nursing note and vitals reviewed. Constitutional: She appears well-developed and well-nourished. No distress.  HENT:  Head: Normocephalic and atraumatic.  Mucous membranes dry  Eyes: Conjunctivae are normal. Pupils are equal, round, and reactive to light.  Neck: Neck supple. No tracheal deviation present. No thyromegaly present.  Cardiovascular: Normal rate and regular rhythm.   No murmur heard. Pulmonary/Chest: Effort normal and breath sounds normal.  Abdominal: Soft. Bowel sounds are normal. She exhibits  no distension and no mass. There is tenderness. There is no rebound and no guarding.  Obese minimal diffuse tenderness  Musculoskeletal: Normal range of motion. She exhibits no edema and no tenderness.  Neurological: She is alert. Coordination normal.  Skin: Skin is warm and dry. No rash noted.  Psychiatric: She has a normal mood and affect.    ED Course  Procedures (including critical care time) Labs Review Labs Reviewed  CBC WITH DIFFERENTIAL - Abnormal; Notable for the following:    WBC 14.8 (*)    Platelets 484 (*)    Neutro Abs 9.0 (*)    Monocytes Relative 14 (*)    Monocytes Absolute 2.1 (*)    All other components within normal limits  COMPREHENSIVE METABOLIC PANEL  LIPASE, BLOOD  URINALYSIS, ROUTINE W REFLEX MICROSCOPIC   Imaging Review No results found.   EKG Interpretation None     7:40 PM patient feels much improved after treatment with intravenous fluids and intravenous antiemetics. She is no longer nauseated. She  did drink without difficulty. X-rays viewed by me. Results for orders placed during the hospital encounter of 10/14/13  COMPREHENSIVE METABOLIC PANEL      Result Value Ref Range   Sodium 138  137 - 147 mEq/L   Potassium 3.4 (*) 3.7 - 5.3 mEq/L   Chloride 93 (*) 96 - 112 mEq/L   CO2 26  19 - 32 mEq/L   Glucose, Bld 211 (*) 70 - 99 mg/dL   BUN 61 (*) 6 - 23 mg/dL   Creatinine, Ser 2.92 (*) 0.50 - 1.10 mg/dL   Calcium 10.3  8.4 - 10.5 mg/dL   Total Protein 7.9  6.0 - 8.3 g/dL   Albumin 3.6  3.5 - 5.2 g/dL   AST 25  0 - 37 U/L   ALT 27  0 - 35 U/L   Alkaline Phosphatase 136 (*) 39 - 117 U/L   Total Bilirubin 0.5  0.3 - 1.2 mg/dL   GFR calc non Af Amer 16 (*) >90 mL/min   GFR calc Af Amer 18 (*) >90 mL/min  LIPASE, BLOOD      Result Value Ref Range   Lipase 36  11 - 59 U/L  CBC WITH DIFFERENTIAL      Result Value Ref Range   WBC 14.8 (*) 4.0 - 10.5 K/uL   RBC 5.06  3.87 - 5.11 MIL/uL   Hemoglobin 14.8  12.0 - 15.0 g/dL   HCT 44.0  36.0 -  46.0 %   MCV 87.0  78.0 - 100.0 fL   MCH 29.2  26.0 - 34.0 pg   MCHC 33.6  30.0 - 36.0 g/dL   RDW 13.1  11.5 - 15.5 %   Platelets 484 (*) 150 - 400 K/uL   Neutrophils Relative % 60  43 - 77 %   Neutro Abs 9.0 (*) 1.7 - 7.7 K/uL   Lymphocytes Relative 23  12 - 46 %   Lymphs Abs 3.4  0.7 - 4.0 K/uL   Monocytes Relative 14 (*) 3 - 12 %   Monocytes Absolute 2.1 (*) 0.1 - 1.0 K/uL   Eosinophils Relative 2  0 - 5 %   Eosinophils Absolute 0.3  0.0 - 0.7 K/uL   Basophils Relative 0  0 - 1 %   Basophils Absolute 0.0  0.0 - 0.1 K/uL  URINALYSIS, ROUTINE W REFLEX MICROSCOPIC      Result Value Ref Range   Color, Urine YELLOW  YELLOW   APPearance CLOUDY (*) CLEAR   Specific Gravity, Urine 1.024  1.005 - 1.030   pH 5.0  5.0 - 8.0   Glucose, UA 100 (*) NEGATIVE mg/dL   Hgb urine dipstick NEGATIVE  NEGATIVE   Bilirubin Urine NEGATIVE  NEGATIVE   Ketones, ur NEGATIVE  NEGATIVE mg/dL   Protein, ur >300 (*) NEGATIVE mg/dL   Urobilinogen, UA 0.2  0.0 - 1.0 mg/dL   Nitrite NEGATIVE  NEGATIVE   Leukocytes, UA NEGATIVE  NEGATIVE  URINE MICROSCOPIC-ADD ON      Result Value Ref Range   Squamous Epithelial / LPF FEW (*) RARE   WBC, UA 0-2  <3 WBC/hpf   RBC / HPF 0-2  <3 RBC/hpf   Bacteria, UA RARE  RARE   Casts HYALINE CASTS (*) NEGATIVE   Urine-Other AMORPHOUS URATES/PHOSPHATES     Dg Abd Acute W/chest  10/14/2013   CLINICAL DATA:  Abdominal pain.  Vomiting.  Diarrhea.  EXAM: ACUTE ABDOMEN SERIES (ABDOMEN 2 VIEW & CHEST 1 VIEW)  COMPARISON:  None.  FINDINGS: There is no evidence of dilated bowel loops or free intraperitoneal air. No radiopaque calculi or other significant radiographic abnormality is seen. Surgical clips noted in right and left upper quadrants.  Heart size and mediastinal contours are within normal limits. Both lungs are clear.  IMPRESSION: Negative abdominal radiographs.  No active cardiopulmonary disease.   Electronically Signed   By: Earle Gell M.D.   On: 10/14/2013 18:28    MDM    Final diagnoses:  None   renal insufficiency is improved over previous 08/25/2013. Oral hydration encouraged. Prescription Zofran. Imodium for diarrhea. Symptoms and exam consistent with gastroenteritis Diagnosis #1 gastroenteritis #2 chronic renal insufficiency #3 hypokalemia #4 hyperglycemia     Orlie Dakin, MD 10/14/13 4035

## 2013-10-14 NOTE — Discharge Instructions (Signed)
Make sure that you stay well hydrated. Drink six 8 ounce glasses of water daily. Avoid milk or foods containing milk such as cheese or ice cream or dairy products while having diarrhea. Take Imodium as direct and for diarrhea. Take the medication prescribed as needed for nausea. See Dr.Nadel in the office if you continue to have symptoms in 2 or 3 days. Return here if you feel worse for any reason or are unable to see Dr. Lenna Gilford

## 2013-10-14 NOTE — ED Provider Notes (Signed)
CSN: [id number]     Arrival date & time 10/14/13  1341 History   First MD Initiated Contact with Patient 10/14/13 1358     Chief Complaint  Patient presents with  . Emesis  . Diarrhea   (Consider location/radiation/quality/duration/timing/severity/associated sxs/prior Treatment) Patient is a 68 y.o. female presenting with vomiting. The history is provided by the patient.  Emesis Severity:  Moderate Duration:  1 week Quality:  Stomach contents Chronicity:  New Relieved by:  None tried Worsened by:  Nothing tried Ineffective treatments:  None tried Associated symptoms: abdominal pain and diarrhea   Associated symptoms: no fever   Risk factors: diabetes     Past Medical History  Diagnosis Date  . Unspecified essential hypertension   . CAD (coronary artery disease) 01/04/2006    Tiny OM1 70-90% ostial stenosis, no other CAD  . Asthma   . Esophageal reflux   . Obesity, unspecified   . Anxiety state, unspecified   . Hypercalcemia   . Anemia   . Other psoriasis   . Diverticulosis   . Sarcoidosis 2012  . Dyslipidemia   . COPD (chronic obstructive pulmonary disease)   . Gout   . Colon polyps     Tubular adenomatous polyps  . Spinal stenosis of lumbar region   . Bulging lumbar disc   . CHF (congestive heart failure)   . PNA (pneumonia) 2012    With pleural effusion, requiring thoracentesis  . Pneumonia     "several times" (04/02/2013)  . Pleural effusion 2012  . Chronic bronchitis   . Exertional shortness of breath     "and lying flat" (04/02/2013)  . On home oxygen therapy     "2L when I'm up; 3L when I'm at sleep" (04/02/2013)  . OSA (obstructive sleep apnea)     marginally compliant with CPAP  . Type II diabetes mellitus   . Degenerative joint disease   . Arthritis     'all over" (04/02/2013)  . Chronic lower back pain   . Kidney disease     baseline creatinie 2.0,  folowed by Dr Hassell Done  . Renal failure     "kidney's are working @ 50%" (04/02/2013)  . Diverticulosis   .  GERD (gastroesophageal reflux disease)   . Anxiety   . Renal insufficiency    Past Surgical History  Procedure Laterality Date  . Vesicovaginal fistula closure w/  total abdominal hysterectomy  1992  . Bilateral total knee replacements Bilateral Rt=5/04 & Lft=1/09    by DrAlusio  . 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 Zella Richer  . Appendectomy  1957  . Tonsillectomy  1968  . Cholecystectomy  1980's  . Abdominal hysterectomy  1992  . Reduction mammaplasty Bilateral 1980's  . Cardiac catheterization  1990's  . Thoracentesis  2012   Family History  Problem Relation Age of Onset  . Diabetes Mother   . Breast cancer Maternal Aunt    History  Substance Use Topics  . Smoking status: Never Smoker   . Smokeless tobacco: Never Used  . Alcohol Use: No   OB History   Grav Para Term Preterm Abortions TAB SAB Ect Mult Living                 Review of Systems  Constitutional: Negative.   Gastrointestinal: Positive for nausea, vomiting, abdominal pain and diarrhea. Negative for constipation.    Allergies  Adhesive; Avelox; Codeine; Guaifenesin; Latex; and Oxycodone  Home Medications   Current Outpatient Rx  Name  Route  Sig  Dispense  Refill  . ADVAIR DISKUS 250-50 MCG/DOSE AEPB      inhale 1 dose by mouth twice a day   60 each   11   . albuterol (PROVENTIL HFA;VENTOLIN HFA) 108 (90 BASE) MCG/ACT inhaler   Inhalation   Inhale 2 puffs into the lungs every 6 (six) hours as needed for wheezing or shortness of breath.         . Alum & Mag Hydroxide-Simeth (MAGIC MOUTHWASH) SOLN      1 tsp 4 times daily gargle and swallow   120 mL   0   . aspirin EC 81 MG tablet   Oral   Take 81 mg by mouth daily.         Marland Kitchen atorvastatin (LIPITOR) 20 MG tablet   Oral   Take 1 tablet (20 mg total) by mouth daily.   90 tablet   3   . esomeprazole (NEXIUM) 40 MG capsule   Oral   Take 1 capsule (40 mg total) by mouth 2 (two) times daily.   90 capsule   3   . ferrous sulfate 325  (65 FE) MG tablet   Oral   Take 325 mg by mouth daily with breakfast.         . fluticasone (FLONASE) 50 MCG/ACT nasal spray   Nasal   Place 2 sprays into the nose at bedtime.   48 g   3   . Fluticasone-Salmeterol (ADVAIR) 250-50 MCG/DOSE AEPB   Inhalation   Inhale 1 puff into the lungs 2 (two) times daily.   3 each   3   . hydrALAZINE (APRESOLINE) 25 MG tablet   Oral   Take 1 tablet (25 mg total) by mouth 3 (three) times daily.   270 tablet   1   . HYDROcodone-acetaminophen (NORCO/VICODIN) 5-325 MG per tablet   Oral   Take 2 tablets by mouth every 4 (four) hours as needed for pain.   20 tablet   0   . HYDROcodone-homatropine (HYCODAN) 5-1.5 MG/5ML syrup   Oral   Take 5 mLs by mouth every 6 (six) hours as needed for cough.   140 mL   0   . hydrOXYzine (ATARAX/VISTARIL) 25 MG tablet   Oral   Take 1 tablet (25 mg total) by mouth every 4 (four) hours as needed.   100 tablet   5   . insulin detemir (LEVEMIR) 100 UNIT/ML injection   Subcutaneous   Inject 56 Units into the skin 2 (two) times daily.          . insulin lispro (HUMALOG KWIKPEN) 100 UNIT/ML SOPN      Take 24 units in the morning, 34 units in the afternoon and 30 units in the evening.         . isosorbide dinitrate (ISORDIL) 20 MG tablet   Oral   Take 1 tablet (20 mg total) by mouth 3 (three) times daily.   270 tablet   1   . ketorolac (ACULAR LS) 0.4 % SOLN   Both Eyes   Place 1 drop into both eyes 3 (three) times daily.          . Lancets MISC   Does not apply   1 Units by Does not apply route 4 (four) times daily - after meals and at bedtime.         . Linaclotide (LINZESS) 290 MCG CAPS capsule   Oral   Take 1  capsule (290 mcg total) by mouth daily.   30 capsule   3   . LORazepam (ATIVAN) 1 MG tablet      Take 1/2 to 1 tablet by mouth three times daily as needed   50 tablet   5   . metoprolol tartrate (LOPRESSOR) 25 MG tablet   Oral   Take 0.5 tablets (12.5 mg total) by  mouth 2 (two) times daily.   90 tablet   3   . ondansetron (ZOFRAN ODT) 4 MG disintegrating tablet   Oral   Take 1 tablet (4 mg total) by mouth every 8 (eight) hours as needed for nausea or vomiting.   12 tablet   1   . predniSONE (DELTASONE) 5 MG tablet   Oral   Take 5 mg by mouth daily with breakfast.         . pregabalin (LYRICA) 100 MG capsule   Oral   Take 1 capsule (100 mg total) by mouth every evening.   90 capsule   1   . torsemide (DEMADEX) 20 MG tablet   Oral   Take 2 tablets (40 mg total) by mouth daily.   60 tablet   3   . traMADol (ULTRAM) 50 MG tablet   Oral   Take 1 tablet (50 mg total) by mouth every 6 (six) hours as needed.   20 tablet   0    BP 165/72  Pulse 78  Temp(Src) 98 F (36.7 C) (Oral)  Resp 18  SpO2 100% Physical Exam  Nursing note and vitals reviewed. Constitutional: She is oriented to person, place, and time. She appears well-developed and well-nourished.  Abdominal: Soft. Bowel sounds are normal. She exhibits no distension and no mass. There is tenderness. There is no rebound and no guarding.  Neurological: She is alert and oriented to person, place, and time.  Skin: Skin is warm and dry.    ED Course  Procedures (including critical care time) Labs Review Labs Reviewed - No data to display Imaging Review No results found.   MDM   1. Vomiting and diarrhea     Sent for eval of n/v/d for 1 week including today in setting of crf, dm and chf.    Billy Fischer, MD 10/14/13 1524

## 2013-10-14 NOTE — ED Notes (Signed)
Returned from x-ray.  Pt placed back on cardiac monitor and pulse ox.

## 2013-10-14 NOTE — ED Notes (Signed)
Pt sent here from [facility] for abd pain and n/v/d x 1 week.

## 2013-10-14 NOTE — ED Notes (Signed)
Dr. Jacubowitz at bedside 

## 2013-10-16 ENCOUNTER — Encounter (HOSPITAL_COMMUNITY): Payer: Self-pay

## 2013-10-16 ENCOUNTER — Encounter (HOSPITAL_COMMUNITY)
Admission: RE | Admit: 2013-10-16 | Discharge: 2013-10-16 | Disposition: A | Discharge status: Home / Self Care | Payer: Self-pay | Source: Ambulatory Visit | Attending: Pulmonary Disease | Admitting: Pulmonary Disease

## 2013-10-16 ENCOUNTER — Inpatient Hospital Stay (HOSPITAL_COMMUNITY)
Admission: RE | Admit: 2013-10-16 | Discharge: 2013-10-16 | Disposition: A | Discharge status: Home / Self Care | Payer: Medicare Other | Source: Ambulatory Visit

## 2013-10-16 ENCOUNTER — Telehealth: Payer: Self-pay | Admitting: Pulmonary Disease

## 2013-10-16 DIAGNOSIS — J45909 Unspecified asthma, uncomplicated: Secondary | ICD-10-CM | POA: Insufficient documentation

## 2013-10-16 DIAGNOSIS — Z5189 Encounter for other specified aftercare: Secondary | ICD-10-CM | POA: Insufficient documentation

## 2013-10-16 HISTORY — DX: Hyperlipidemia, unspecified: E78.5

## 2013-10-16 NOTE — Telephone Encounter (Signed)
Error

## 2013-10-17 ENCOUNTER — Telehealth: Payer: Self-pay | Admitting: Pulmonary Disease

## 2013-10-17 NOTE — Progress Notes (Signed)
[name] is here today for orientation to Pulmonary Rehab. She arrived via wheelchair from [employer].  Neatly dressed, pleasant, using nasal oxygen @ 2L/min.  Medical history and medications reviewed with patient.  During our review, we revisited her insurance status.  She was told by caseworker with [facility] that she would have 100% coverage for her Pulmonary Rehab.  She has diagnosis that has previouly not been covered at this rate.  I did contact [place] and spoke with case worker and customer service while sitting with [name].   She did not have a qualifing diagnosis of COPD Gold Stage 2 to qualify her for the service that paid @ 100%.  This was explained to [name], and that for her to participate she would have a $35 co-pay per visit.  She states this is not possible financially for her to do.  I did discuss different options with her to facilitate an exercise program that she could participate for a short period of time and extablish guidelines for exercise. She is considering the maintenance program for a cost of $68 per month. She will follow up with me after she has had time to evaluate her options and we will be happpy to assist her in any way in order to get her started exercising.   Leverne Humbles RN  Note completed 10/17/2013 3:18 pm

## 2013-10-17 NOTE — Telephone Encounter (Signed)
Called spoke with phyllis. She is going to fax this form over to triage. Will await fax to triage to start on maintenance.

## 2013-10-22 NOTE — Telephone Encounter (Signed)
Leigh I did not see this in triage, did you receive it? [place] Bing, CMA

## 2013-10-23 ENCOUNTER — Encounter (HOSPITAL_COMMUNITY)
Admission: RE | Admit: 2013-10-23 | Discharge: 2013-10-23 | Disposition: A | Discharge status: Home / Self Care | Payer: Self-pay | Source: Ambulatory Visit | Attending: Pulmonary Disease | Admitting: Pulmonary Disease

## 2013-10-25 ENCOUNTER — Encounter (HOSPITAL_COMMUNITY)
Admission: RE | Admit: 2013-10-25 | Discharge: 2013-10-25 | Disposition: A | Discharge status: Home / Self Care | Payer: Self-pay | Source: Ambulatory Visit | Attending: Pulmonary Disease | Admitting: Pulmonary Disease

## 2013-10-30 ENCOUNTER — Encounter (HOSPITAL_COMMUNITY)
Admission: RE | Admit: 2013-10-30 | Discharge: 2013-10-30 | Disposition: A | Discharge status: Home / Self Care | Payer: Self-pay | Source: Ambulatory Visit | Attending: Pulmonary Disease | Admitting: Pulmonary Disease

## 2013-11-01 ENCOUNTER — Encounter (HOSPITAL_COMMUNITY)
Admission: RE | Admit: 2013-11-01 | Discharge: 2013-11-01 | Disposition: A | Discharge status: Home / Self Care | Payer: Self-pay | Source: Ambulatory Visit | Attending: Pulmonary Disease | Admitting: Pulmonary Disease

## 2013-11-01 DIAGNOSIS — J45909 Unspecified asthma, uncomplicated: Secondary | ICD-10-CM | POA: Insufficient documentation

## 2013-11-01 DIAGNOSIS — Z5189 Encounter for other specified aftercare: Secondary | ICD-10-CM | POA: Insufficient documentation

## 2013-11-01 NOTE — Progress Notes (Signed)
Patient arrived to exercise today, pre-exercise CBG was 333.  Patient did not take her insulin this morning.  She was instructed to take her insulin before coming to class and eat a good breakfast.  She was not allowed to exercise today.

## 2013-11-05 NOTE — Telephone Encounter (Signed)
This form is in SN sign folder.  Will have SN sign this and will get this faxed back.  Will sign off of this message.

## 2013-11-05 NOTE — Telephone Encounter (Signed)
Leigh, please advise if this has been done so we can close, thanks!

## 2013-11-06 ENCOUNTER — Encounter (HOSPITAL_COMMUNITY)
Admission: RE | Admit: 2013-11-06 | Discharge: 2013-11-06 | Disposition: A | Discharge status: Home / Self Care | Payer: Self-pay | Source: Ambulatory Visit | Attending: Pulmonary Disease | Admitting: Pulmonary Disease

## 2013-11-07 ENCOUNTER — Telehealth: Payer: Self-pay | Admitting: Pulmonary Disease

## 2013-11-07 NOTE — Telephone Encounter (Signed)
ATC line busy WCB 

## 2013-11-08 ENCOUNTER — Encounter (HOSPITAL_COMMUNITY)
Admission: RE | Admit: 2013-11-08 | Discharge: 2013-11-08 | Disposition: A | Discharge status: Home / Self Care | Payer: Self-pay | Source: Ambulatory Visit | Attending: Pulmonary Disease | Admitting: Pulmonary Disease

## 2013-11-08 NOTE — Telephone Encounter (Signed)
ATC home #, NA, no voicemail. [id number] on cell #.Smayan Hackbart Har[place] Dark, CMA

## 2013-11-09 NOTE — Telephone Encounter (Signed)
ATC both home and cell # NA on both, no voicemail. [place] Bing, CMA

## 2013-11-12 ENCOUNTER — Other Ambulatory Visit: Payer: Self-pay | Admitting: Pulmonary Disease

## 2013-11-12 ENCOUNTER — Telehealth: Payer: Self-pay | Admitting: Pulmonary Disease

## 2013-11-12 NOTE — Telephone Encounter (Signed)
Decided not to LM.  [name]

## 2013-11-12 NOTE — Telephone Encounter (Signed)
Per protocol I will sign off on message and await a call back. Semiah Konczal, CMA  

## 2013-11-13 ENCOUNTER — Encounter (HOSPITAL_COMMUNITY)
Admission: RE | Admit: 2013-11-13 | Discharge: 2013-11-13 | Disposition: A | Discharge status: Home / Self Care | Payer: Self-pay | Source: Ambulatory Visit | Attending: Pulmonary Disease | Admitting: Pulmonary Disease

## 2013-11-13 NOTE — Telephone Encounter (Signed)
error 

## 2013-11-14 ENCOUNTER — Telehealth: Payer: Self-pay | Admitting: Pulmonary Disease

## 2013-11-14 ENCOUNTER — Other Ambulatory Visit: Payer: Self-pay | Admitting: Pulmonary Disease

## 2013-11-14 MED ORDER — PREGABALIN 100 MG PO CAPS: 100.0000 mg | ORAL_CAPSULE | Freq: Every evening | ORAL | Status: DC

## 2013-11-14 MED ORDER — ESOMEPRAZOLE MAGNESIUM 40 MG PO CPDR: 40.0000 mg | DELAYED_RELEASE_CAPSULE | Freq: Two times a day (BID) | ORAL | Status: DC

## 2013-11-14 NOTE — Telephone Encounter (Signed)
Called spoke w/ pt. She reports no one told her SN was retiring from [facility] 10/31/13 so she does not have a new PCP yet. She is requesting refill on lyrica and nexium. I gave pt phone # to [facility] to call for appt.   RX's sent. Nothing further needed

## 2013-11-15 ENCOUNTER — Encounter (HOSPITAL_COMMUNITY): Payer: Self-pay

## 2013-11-16 ENCOUNTER — Telehealth: Payer: Self-pay | Admitting: Pulmonary Disease

## 2013-11-16 NOTE — Telephone Encounter (Signed)
Received PA form from [place] for nexium 40 mg bid  Called ins at [phone] Pt ID number [id number]  They will fax the form to triage  Form received and given to Leigh to have SN sign  Will forward to her to f/u on  Thanks!

## 2013-11-19 ENCOUNTER — Encounter: Payer: Self-pay | Admitting: Pulmonary Disease

## 2013-11-19 ENCOUNTER — Ambulatory Visit (INDEPENDENT_AMBULATORY_CARE_PROVIDER_SITE_OTHER): Payer: Medicare Other | Admitting: Pulmonary Disease

## 2013-11-19 VITALS — BP 118/74 | HR 71

## 2013-11-19 DIAGNOSIS — E1129 Type 2 diabetes mellitus with other diabetic kidney complication: Secondary | ICD-10-CM

## 2013-11-19 DIAGNOSIS — I10 Essential (primary) hypertension: Secondary | ICD-10-CM

## 2013-11-19 DIAGNOSIS — D869 Sarcoidosis, unspecified: Secondary | ICD-10-CM

## 2013-11-19 DIAGNOSIS — F411 Generalized anxiety disorder: Secondary | ICD-10-CM

## 2013-11-19 DIAGNOSIS — J984 Other disorders of lung: Secondary | ICD-10-CM

## 2013-11-19 DIAGNOSIS — I5032 Chronic diastolic (congestive) heart failure: Secondary | ICD-10-CM

## 2013-11-19 DIAGNOSIS — D472 Monoclonal gammopathy: Secondary | ICD-10-CM

## 2013-11-19 DIAGNOSIS — G4733 Obstructive sleep apnea (adult) (pediatric): Secondary | ICD-10-CM

## 2013-11-19 DIAGNOSIS — N259 Disorder resulting from impaired renal tubular function, unspecified: Secondary | ICD-10-CM

## 2013-11-19 MED ORDER — LORAZEPAM 1 MG PO TABS: ORAL_TABLET | ORAL | Status: DC

## 2013-11-19 NOTE — Patient Instructions (Signed)
Today we updated your med list in our EPIC system...    Continue your current medications the same...  We will follow up the labs from [facility]...  Call for any questions...  Let's plan a follow up visit in 3-36mo, sooner if needed for problems.Marland KitchenMarland Kitchen

## 2013-11-19 NOTE — Progress Notes (Signed)
Subjective:    Patient ID: [name], female    DOB: 1945-12-29, 68 y.o.   MRN: [id number]  HPI 68 y/o BF here for a follow up visit... he has multiple medical problems as noted below...    ~  April 28, 2012:  27moROV & last visit we decreased the Pred to 528md (on this for hypercalcemia from Sarcoid) & decreased the Lasix to 2057m (due to RI w/ Cr=2.2);  Follow up labs today showed Ca=9.1 and we decided to decr the Pred further to 5mg56md;  In addition her BUN=40, Creat=2.1, BNP=196 (ok to continue Lasix20); she understands that if her Creat starts to rise further that we will have to send her to Nephrology...    She saw DrAltheimer for f/u 9/13> note reviewed & he adjusted her insulin regimen- Levemir 80uQam & Apidra 20-34-30 + SS adjustments...    She fell 8/30 & hit her head> went to ER & DrLockwood's note is reviewed- c/o headache & left knee pain; Exam showed forehead hematoma & CTBrain showed left frontal scalp hematoma, no fx, mild cortical atrophy, mild carotid atherosclerosis, NAD;  Maxillofacial CT was neg w/o facial bone fx;  Left knee prosthesis & no acute changes...    She reports that she just had an ESI injection from DrRamos for LBP & she is improved...    [name], meds, xrays and labs> see below for updates >>  LABS 9/13:  Chems- BS=234, Ca=9.1, BUN=40, Creat=2.1, BNP=196   ~  June 21, 2012:  33mo 60mo& [name] went to the ER 06/14/12 w/ c/o SOB> she had developed a sore throat, wheezing, cough, & SOB; she had taken ZPak, Delsym, Proair which helped somewhat;  CXR showed cardiomeg, pulm vasc congestion, DJD in TSpine, NAD;  ER treated w/ Pred boost to 40mg/41m NEB Rx which really helped;  She notes stuff head & some drainage, denies GI/ reflux symptoms;  She is obese, cushingoid, and we were in the process of weaning off the Pred when it was boosted up by ER- we gave her a NEB Rx & this really helped;  Placed on O2 by nasal cannula for hypoxemia- 84%  on RA;  Decision made to wean Pred down to 5mgQod79m before, continue Advair250Bid, add NEBS Qid w/ Albut vs Proair, plus her Mucinex/ Fluids/ etc...    She continues her regular f/u appts w/ DrAltheimer, Endocrine> on Levemir, Apidra, Amaryl, Januvia; last A1c=8.1 is Sep2013...    [name], meds, xrays and labs> see below for updates >>   ~  October 25, 2012:  29mo ROV29moost hospital visit> she was [facility]> presented w/ weakness, difficulty ambulating, sl confusion w/ memory loss; she had been to DrAltheimer's office & labs showed Ca>14 w/ Creat>4;  Hypercalcemia related to Sarcoidosis- treated w/ IVF, Lasix, Calcitonin, & Solumedrol- ACE level=80;  She was disch on Pred20Bid but we will need to wean this quickly due to her DM;  She had acute renal failure as well w/ Creat>4 and after hydration she was disch w/ Creat ~2;  DM followed & treated by DrAltheimer;  DCSummary reviewed in detail>>    Sarcoidosis w/ HYPERCALCEMIA> Hypercalcemia returned when Pred was cut to 5mg Qod;68 y.o. post hosp & Ca=10.6; we discussed wean to 20mg/d w/68 y.o. level in 3 weeks...     HBP> on Metop25- 1/2, Norvasc10, Apres10Tid, Clonidine0.1Bid, Lasix20, K10-2/d; BP= 160/72 &  she denies CP, palpit, dizzy, ch in SOB/ DOE, edema, etc...    Cardiac Arrhythmia> she has WCT & SBrady- eval by DrAllred for Cards & meds adjusted...    CHOL> on Lip20; last FLP was 4/12- looked good & we reviewed low fat diet restrictions...    DM on Insulin> followed by DrA on [place] coverage at meals; BS at home varies 150-400+, labs done Q651moby DrAltheimer...    Obesity> wt= 203# (63"Tall & BMI=37) and we reviewed diet, exercise, & wt reduction strategies...    GI> GERD on NexiumBid; and we reviewed antireflux regimen (elev HOB, npo after dinner, etc)...    Renal Insuffic> Creat has returned to 2.08 now; advised incr free water intake...    Anxiety> on Alprazolam prn; she's been  under extra stress w/ loss of job, this illness, family issues... [name], meds, xrays and labs> see below for updates >>   ~  November 08, 2012:  2week ROV & she has been stable on Pred227md; still tired, notes DOE w/ walking; BMet today shows K=5.1, BUN=51, Cr=2.1, Ca=9.4; we discussed decr Pred to 105m, incr free water intake, continue Lasix20; ROV 68 y.o.   Meds reviewed> HBP controlled on her 5med32mChol is ok on Lip20; DM regulated by DrAltheimer on LevimStansbury Parkis unfortunately up to 205# & we reviewed diet/ exercise/ etc; she is coping well w/ her serious medical problems...  ~  Dec 19, 2012:  6wk ROV & recheck after her last visit w/ stable numbers on Pred20mg/18me decided to decr to 10mg/d74mollow up at this time to recheck her Calcium etc; unfortunately she made a mistake & has continued the Pred at the 20mg/d 80m since her last visit... Feeling OK overall, her wt is up 3# to 207# today, BP= 138/72, and she recently had f/u DrAltheimer to recheck her DM (insulin adjusted)...  We reviewed her medication record & her procedures and instructed her to decr the Pred to 10mg/d a28mis point... We wil  Recheck pt in 6-8weeks w/ BMet at that time...  ~  February 19, 2013:  68 y.o. last seen [name] cut her Pred to 10mg/d> sh22mes DrAltheimer frequently w/ extensive lab work done at each visit; last labs avail to us 6/14 shoKoread Calcium=9.6, BUN=36, Cr=1.86, A1c=8.5; We decided to continue Pred taper & she is instructed to decr the Pred to 05-06-09-5 Qod w/ ROV planned 68 y.o.  She9mo Home O2 for her Sarcoid, chronic hypoxemic resp failure, DiastolicCHF> we did Ambulatory O2 check today>  O2sats on RA:  Rest= 94% w/ pulse72;  After 3laps= 82% w/ pulse=90...     OSA on CPAP but only using 1-2d/wk    BP= 122/80 on her cardiac meds> Metop, Amlod, Hydral, Clonid, Lasix40; she denies CP, palpit, dizzy/ syncope, SOB, edema...    DM treated by DrA w/ Levemir 78uAM-0uPM, Apidra  18-30-26, Glimep4, Januv50; he [id number] to check her every month w/ extensive labs & his notes reviewed...    Despite diet etc her weight is up to 214# & we reviewed wt reduction strategy...    Renal insuffic w/ Cr ~2 chronically [name], meds, xrays and labs> see below for updates >>   ~  April 16, 2013:  5mo ROV & po620moosp check> [name] was Adm by [facility] 9/1 - 04/10/13 w/ acute on chronic diastolic CHF when she presented w/ increased SOB & edema; BNP was 2600, CXR  showed vasc congestion, 2DEcho w/ EF=60-65% & Gr2DD, Creat= 2.5 but increased to 4.1 w/ diuresis; that forced them to replace some fluids, she was disch on Demadex40...   Since disch she has been back to see DrAltheimer 9/12 w/ labs showing BS=255, A1c=8.1, BUN=73, Cr=2.94 and she has an upcoming appts w/ Nephrology & the CHF clinic soon...  Of note the Hospitalist team held her Pred during the hosp & didn't restart after disch- recall hx Sarcoidosis w/ primarily severe hypercalcemia as manifestation, prev splenectomy w/ transient improvement then recurrent hypercalcemia & Rx w/ Pred w/ nice response but calcium bumped up to >14 when Pred weaned to 60m Qod in early 2014; we have slowly diminished her dose back to 10-5 Qod when last seen & review of labs showed Calcium levels all wnl in hosp and measured 10.1 in DrAltheimer's office 9/11... We discussed options here & we decided to RESTART PRED 10-5 QOD for the next few weeks (she is feeling weak since off the Pred) then taper to 526md til return o\in [id number] we will check BMet & ACE level at that time...  ~  June 19, 2013:  73m73moV & [name] continues to struggle w/ her multisystem disease>> from the Pulm standpoint her breathing is sl better & she is using Pred5mg60m Advair250Bid, ProairHFA as needed; her calcium level remains wnl as we have weaned the Pred (Ca=9.5 on labs yest); we discussed coming down 1/2 step to 5mg 61mernate w/ 2.5mg Q19m& we will recheck her in  73mo...30mo CARDS>>  On Metoprolol25-1/2Bid, Bidil20-37.5Tid, Demadex20-2/d;  She had f/u w/ DrMcLean 10/14> 67/89>hr diastolicCHF; sl improved since hosp 9/14 where Echo showed EF60-65%, Gr2DD, mildAS, RV normal; now on Demadex20-2Qam but labs yest showed BUN=113, Cr=4.2 (weight was up recently & they increased her to 40mgBid80m several days, now asked to hold Demadex for 2d & resume 40mg Qam26mer that w/ f/u labs); she needs f/u w/ Nephrology- DrSanford...     DM>>  Endocrine is managed by DrAltheimer on Levemir 50uBid and Apidra=> switched to Humalog (taking 18-30-26) but both are too expensive in the donut hole & she cannot afford her meds; asked to discuss w/ drAlt the poss of switch to NPH or poss 70/30 insulin to save money etc...     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     RENAL>>  She saw DrSanford 9/14> I have reviewed his perspicacious note & discussed w/ him; we have been able to wean her Pred down to 5mg/d and10mr hypercalcemia is remaining under control (there is no data on treatment for this manifestation w/ alternative meds); her CKD is most likely due to her DM & HBP, also has MGUS & the sarcoid (only clinical manifestation has been the hypercalcemia & lymphoreticular granulomatosis); NOTE: Cr prev 2 range, then 2-3 range & now 3-4 range since Cards increased diuretics for her diastolicCHF (hopefully Cr will improve somewhat as she's been on incr diuretics, water restriction, & had the gastroenteritis)...     PSORIASIS>> she sees DrTafeen for Derm; asking for refill Clobetasol cream & Atarax25mg prn i54mng [name], meds, xrays and labs> see below for updates >>   LABS 11/14 showed BS=279, BUN=113, Cr=4.2... Cards haMarland KitchenMarland Kitchenresponded w/ transient decr in diuretic dose, she needs  f/u w/  Renal...  ADDENDUM>>  Full PFTs done 08/08/13 prior to getting her enrolled into PulmRehab program>>  FVC=1.50 (65%), FEV1=1.23 (69%), %1sec=82, no improvement in FEV1 post bronchdilator, Lung volumes are mild to mod restricted, DLCO is reduced but normalizes after correction for VA...  ~  August 20, 2012:  34moROV & [name] tells me she had a URI over Christmas that lasted 3 wks, she kept the Pred at 541md stating that she felt worse when she cut it to 5-2.5 Qod ("I felt weak")...     She is awaiting a call from [place] get enrolled...    She needs a substitute for Bidil (20-37.5Tid)- too $$- and we discussed dividing it up into Isordil2040md and Hydralazine 42m81m...    She continues to see DrAltheimer for Endocrine, DM & his extensive notes are reviewed; A1c improved into the 8's...     She saw DrPerry w/ constipation- treated w/ Linzess but too $$, given samples and she will f/u w/ him; she also notes that Nexium definitely works better for her...    She is seeing DrSanford for Nephrology & he is favoring peritoneal dialysis but she isn't so sure; has f/u soon to recheck her renal function soon...    Her prev rash is resolved off Allopurinol... [name], meds, xrays and labs> see below for updates >> she had the 2014 Flu vaccine in Nov...  ~  November 19, 2013:  10mo 35mo& [name] has been enrolled in PulmRRandlemanually tells me insurance wouldn't cover PulmRAlbione is paying for the CardsRehab maintenance program out of pocket... She continues regular follow up visits w/ DrSanford for Nephrology & DrAltheimer for Endocrine (they do labs with each visit which we review)... She had a gastroenteritis 3/15 w/ trip to the ER> Labs noted below- treated w/ IVF & Zofran, Immodium & cleared back to baseline...     OSA>> she indicates still not resting well, intol to CPAP & she agrees to f/u w/ sleep med, DrSooBel Air  PULM>> Hx Sarcoid w/ Hypercalcemia and restrictive lung dis by  PFTs 1/15> her breathing is stable on O2 at 2L/min, Pred5mg/d37mdvair250Bid, ProairHFA as needed; her calcium level remains wnl as we have weaned the Pred down to 5mg/d 60m we've been reluctant to cut further due to her prev hx w/ hyperclcemia flairs when Pred dropped lower than this- last Ca level was 10.3 in the ER 3/15 (gastroenteritis) & CXR was clear; we decided to continue the Pred5mg/d..76m  CARDS>>  On ASA81, Metoprolol25-1/2Bid, Isordil20Tid, Apres25Tid, Demadex20-2/d;  BP= 118/74 & she denies CP but has DOE w/ activity; saw DrMcLean 10/14> C65/46>r diastolicCHF; sl improved since hosp 9/14 where Echo showed EF60-65%, Gr2DD, mildAS, RV normal; on Demadex20-2Qam & renal is stable...    DM>>  Endocrine is managed by DrAltheimer on Levemir 56uBid and Humalog (taking 24-34-30); Labs 1/15 showed BS=165, A1c=8.4...    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     RENAL>>  She saw DrSanford 9/14 & 1/15> Stage4 CKD & nephrotic range proteinuria; I have reviewed his perspicacious note & discussed w/ him; we have been able to wean her Pred down to 5mg/d an77mer hypercalcemia is remaining under control (there is no data on treatment for this manifestation w/  alternative meds); her CKD is most likely due to her DM & HBP, also has MGUS & the sarcoid (only clinical manifestation has been the hypercalcemia & lymphoreticular granulomatosis); NOTE: Cr prev 2 range, then 2-3 range & now 3-4 range since Cards increased diuretics for her diastolicCHF; they discussed dialysis & she is opting for peritoneal dialysis when this is needed, they also discussed poss AVfistula for vasc access if needed later...    PSORIASIS>> she sees DrTafeen for Derm & has been started on some new medication... [name], meds, xrays and labs>  see below for updates >>   CXR 3/15 showed norm heart size, clear lungs, NAD...  LABS 3/15:  Chems- K=3.4, BUN=61, Cr=2.9, Ca=10.3, BS=211;  CBC- ok w/ Hg=14.8, WBC=14.8.Marland KitchenMarland Kitchen          Problem List:    OBSTRUCTIVE SLEEP APNEA - Sleep Study 11/07 showed RDI=21 w/ desat to 62% during REM.Marland Kitchen. eval by [facility] w/ Rx for CPAP 12... compliance poor- only using it 1-2 times/wk... encouraged to use CPAP more regularly & to f/u w/ DrSood regarding the mask interface problems... ~  8/11:  She reports new mask but still not using CPAP regularly, "I rest fairly well". ~  7/12:  She reports not using her CPAP, and not resting well... ~  1/13:  CPAP used briefly during [facility] for diastolic CHF, but still not compliant w/ home use... ~  7/14:  OSA on CPAP but only using 1-2d/wk... we did Ambulatory O2 check today>  O2sats on RA:  Rest= 94% w/ pulse72;  After 3laps= 82% w/ pulse=90...  ~  11/14: encouraged to use the CPAP more regularly... ~  4/15: she indicates that she cannot use the CPAP, intol to mask interface, and we have rec f/u sleep Med consult w/ DrSood to explore her options...  BRONCHITIS, ACUTE - on ADVAIR 250Bid (using Prn only) & PROAIR as needed. 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 ~  CXR 1/09 was pre-op for TKR- sl cardomeg & ?mild vasc congestion. ~  CXR 2/11 showed norm heart size & vascularity, clear, s/p cholecystectomy, DJD in TSpine. ~  CXR & CT Chest 11/11 showed sl peribronch thickening, no lung lesions, no signif adenopathy. ~  PET scan 1/12 showed hypermetabolic activ in spleen & lymph nodes in the supraclav, hilar, mediastinal, periaotic & iliac region as well. ~  CXRs 3/12 in the perioperative period after splenectomy showed left effusion==> tapped & improved aeration. ~  CXR 4/12 back to baseline w/ clear bases, essent wnl... ~  CXR 7/12 showed norm heart size, clear lungs, no definite adenopathy, NAD... ~  LABS 7/12 w/ ACE=78  (8-52), Ca=12.0, AlkPhos=176 (39-117), GGT=151 (7-51) ~  Labs from DrAltheimer> 8/12: Ca=12.4, Creat=1.8; then 9/12: Ca=13.4, Creat=2.3;  ~  10/12:  PREDNISONE 77m/d started 10/12 & 3wks later Ca=10.3, Creat=1.8; therefore weaned to 20-10 Qod. ~  11/12: Labs on Pred 20-10 Qod showed Ca++= 9.8; we will continue to wean Pred. ~  1/13:  CXR showed cardiomeg, mild interstitial edema, DJD spine; Labs showed Ca=9.7 & we decided to wean Pred to 573md... ~  3/13:  Labs showed Ca=10.1 on Pred 16m4m; decided to wean to 16mg30m... ~  6/13:  Labs showed Ca=11.0, ACE=86; on Pred5Qod & rec to incr back to 10mg40m. ~  8/13:  CXR showed cardiomeg, ?mild interstitial edema, mild DJD TSpine... ~  Labs 8/13 showed Ca=10.0 on Pred10/d; rec to decr further to 16mg/d14m ~  Labs  9/13 showed Ca= 9.1 on Pred57m/d & she will try to decr to 5Qod again... ~  11/13: she recently went to ER w/ incr SOB & was placed on Pred40/d; we are adding NEBS w/ ALBUT2.574mid & wean Pred back to 90m34md as before. ~  3/14: she was hosp w/ Ca>14 (on the Pred90mg71m); treated w/ IVF, Lasix, Calcitonin & incr steroids; Disch on Pred20Bid; we will wean Pred to 20mg74m f/u in 3wk. ~  4/14:  On Pred20/d & calcium improved to 9.4; we will decr to Pred10mg/42mf/u 56mo...60mo7/14:  On Pred10mg/d 37mlcium remains well controlled at 9.6 and we decided to decr Pred to 05-06-09-5 Qod... ~  9/14:  Hosp for acute on chr diastolic CHF & [facility] stopped her Pred; Calcium levels in hosp were 9.6-10.1 and recent labs by DrAltheimer showed Ca=10.1; REC- restart Pred 10-5 QOD & try to wean to 90mg/d w/27mu labs in 80mo... ~ 63mo 9/14 showed mild cardiomeg (stable), mild vasc congestion, sl bronch wall thickening & mild elev right hemidiaph (no changes)... ~  11/14: Labs shows Ca=9.5 on Pred90mg daily;59mE=44; Rec to decr further to 90mg alt w/ 20mmg QOD=> sh20mas unable to do this & kept the Pred at 90mg/d... ~  110m: PFTs done prior to enrolling into PulmRehab  program>> FVC=1.50 (65%), FEV1=1.23 (69%), %1sec=82, no improvement in FEV1 post bronchdilator, Lung volumes are mild to mod restricted, DLCO is reduced but normalizes after correction for VA... ~  4/15: Hx Sarcoid w/ Hypercalcemia and restrictive lung dis by PFTs 1/15> her breathing is stable on O2 at 2L/min, Pred90mg/d, Advair287mid, ProairHFA as needed; her calcium level remains wnl as we have weaned the Pred down to 90mg/d but we've60men reluctant to cut further due to her prev hx w/ hyperclcemia flairs when Pred dropped lower than this- last Ca level was 10.3 in the ER 3/15 (gastroenteritis) & CXR was clear; we decided to continue the Pred90mg/d...  HYPERT51mION >> on numerous meds w/ med changes over time >> she is asked to bring all med bottles to the visits but never does! ~  1/13: Post Hosp BP= 146Butlermeds- Metop12.5,20mlod10, Clonid0.1Bid, Lasix20; she denies CP, palpit, SOB, edema> improved from recent hosp... ~  3/13: She called in the interim for "refill" Apresoline not prev on any med list; BP= 150/80 on all 5 meds; must elim sodium & get wt down... ~  6/13:  BP= 142/80 on 90meds but BUN-41 C7mt=2.3 BNP=104; rec to stop Lasix for now, f/u BMet 6wks. ~  8/13:  BP= 140/70 on same meds & BUN=30, Creat=1.6, but BNP incr to 2960 & LASIX 40mg/d restarted...62m9/13:  Follow up labs showed BUN=45, Creat=2.2 on Lasix40 and we rec decr to Lasix20... ~  9/13:  BP= 122/74 on 90meds w/ BUN=40, Cre33m2.1, & BNP=196... Continue same. ~  11/13:  BP= 142/68 on same 90meds... ~  3/14:  BP26m60/72 on same 5 meds for now... ~  4/14:  BP= 128/70 on her 90med regimen...  ~  7/53m  BP= 122/80 on her 5 cardiac meds> Metop, Amlod, Hydral, Clonid, Lasix40; she denies CP, palpit, dizzy/ syncope, SOB, edema... ~  9/14:  She was hosp w/ acute on chronic diastolic CHF & meds changed; disch on Metoprolol25-1/2, Bidil 20-37.5Tid, Demadex20-2/d; BP= 128/70 but recent labs by DrA showed BUN=73, Cr=2.94 & she has f/u w/  Neph & CHF clinic... ~  11/14: on same meds & BP= 138/64...Marland KitchenMarland Kitchen  1/15 BP+ 134/60... ~  4/15: on ASA81, Metoprolol25-1/2Bid, Isordil20Tid, Apres25Tid, Demadex20-2/d;  BP= 118/74 & she denies CP but has DOE w/ activity...  CAD, CHRONIC DIASTOLIC CHF,  & CARDIAC ARRHYTHMIA >> on ASA 31m/d...  ~  Hx non-obstructive CAD w/ cath 6/07 by DrStuckey showing luminal irregularities & tiny first marginal branch of the CIRC w/ ostial lesion... good LVF. ~  2DEcho 9/07 showed mildly calcif AoV and normal LVF & wall motion. ~  2DEcho 11/11 showed norm LV wall thickness, norm LVF w/ EF= 60-65%, norm atria, norm valves, trivial peric fluid behind heart. ~  2DEcho 1/13 showed mild LVH, norm LVF w/ EF=55-60% & no regional wall motion abn, Gr2DD, norm RV... ~  3/14: In hosp for hypercalcemia etc> she had cardiac arrhythmia w/ WCT & SBrady- to be f/u DrAllred... ~  5/14: she had f/u DrAllred> hosp f/u for WCT (atrial tachy per EP), pt asymptomatic- denies CP, palpit, SOB, dizzy/ syncope, edema, etc; no further w/u planned, f/u prn... ~  7/14: she had f/u DrWall> chr diastolicCHF, nonobstructiveCAD, mildMR- stable, no changes made, f/u 144yr. ~  9/14:  She was adm by [facility] w/ acute on chr diastolicCHF (BNUDJ=[id number] w/ vasc congestion, 2DEcho w/ EF60-65% & Gr2DD); meds adjusted & diuresed but Creat incr to 4.1; eventually disch on Demadex40 & Cr= 3.3=>2.9; she has appt in CHF clinic... ~  11/14: she has been actively followed by CHF clinic & DrMcLean w/ med adjustments...  CEREBROVASCULAR DISEASE - she remains on ASA 8181m without TIA's or other neuro manifestations...  ~  MRA Br 9/07 showed mod intracranial atherosclerotic changes...   HYPERLIPIDEMIA - on LIPITOR 60m94m.. FLP monitored at each OV bVarnamtownDrAltheimer... ~  FLP Ray7 showed TChol 114, TG 85, HDL 28, LDL 69 ~  FLP 4/09 showed TChol 154, TG 192, HDL 34, LDL 82... discussed poss of adding fibrate- hold... ~  FLP 2/10 on Lip20 showed TChol 159, TG 207, HDL 29,  LDL 83... rec> add Fenoglide (she didn't). ~  FLP Elizabeth1 on Lip20 showed TChol 185, TG 242, HDL 43, LDL 99... add FENOFIBRATE 160mg93m. ~  FLP 8Allen on Lip20+Feno160 showed TChol 167, TG 216, HDL 37, LDL 96... she stopped Feno160. ~  FLP 4Fonda on Lip20 showed TChol 146, TG 115, HDL 35, LDL 88 ~  FLP 2/13 on Lip20 showed TChol 159, TG 176, HDL 41, LDL 86 ~  LABS checked by DrAltheimer  DM - on LEVEMIR & APIDRA SS coverage now per DrAltheimer; plus prev on GLIMEPIRIDE 4mg/d12md JANUVIA 50mg/d27m labs 4/08 showed BS=138 & HgA1c=7.5... homeMarland KitchenBS = 100 to >300... misses doses and not on diet or exercising... ~  1/09 became hypoglycemic in hosp on 4 meds post knee surg- meds adjusted... ~  3/09 restarted GLIMEPIRIDE 4mg- 1/41mab each AM, & continue Lantus & Metformin... ~  labs 4/09 showed BS= 148, HgA1c= 7.3... rec> Marland Kitchenack on 4 med regimen due to poor control at home. ~  6/09 discussed titrating the Lantus up until FBS 100-120 range... ~  labs 2/10 showed BS= 164, HgA1c= 8.6... very Marland KitchenMarland Kitchensappointing- needs home BS monitoring, incr Lantus. ~  labs 2/11 showed BS= 298, A1c= 8.4...  discussed change Lantus to LEVEMIR 40 u daily... ~  labs 8/11 showed BS= 202, A1c= 10.9... rec> Marland Kitchenncr Levemir to 50, consider Humalog. ~  Nov-Dec 2011:  BS up w/ adjust of meds & addition of Pred after hosp 11/11 & hypercalcemia... ~  She states she returned to LevemirBowmore  40u daily after the splenectomy hosp due to appetite etc;  4/12 BS=117 ~  Labs 7/12 showed BS= 167, A1c= 8.4> on Levemir 50u/d & Amaryl 93m Qam (note creat 1.9). ~  8-10/12: on Levemir30 + ApidraSS per DrA & BS are 150-400+ due to the Pred20; A1c 8/12 was 7.6 ~  11/12:  DrA has increased her Levemir to 46u/d & the Apidra to 10+SS cover Tid... BS here= 168 ~  1/13: [place] (Creat up to 2.4 but improved to 1.7 w/ adjust meds)... ~  DrAltheimer's notes indicates continued adjustments in her insulin regimen w/ Levemir & Apidra... ~  9/14:  He  prev oral meds were stopped & currently taking Levemir40Bid & Apidra incr to 24-34-30 by DrA; recent BS=255, A1c=8.1 ~  11/14:  meds adjusted, on Levemir 50uBid and Apidra=> switched to Humalog (taking 18-30-26) but both are too expensive in the donut hole & she cannot afford her meds; asked to discuss options w/ DrAltheimer. ~  1/15: she had f/u DrAltheimer> his note is reviewed, BS=165, A1c=8.4 ~  2/15: note from Ophthalmology- DrRankin> +DM retinopathy & macular edema...  OBESITY (ICD-278.00) - weight down to 171 after hosp 11/11- doing better on diet, not yet exercising. ~  weight 220-230# in the early 1990's... ~  weight 210-220# in the early 2000's... ~  weight 2/10 = 192# ~  weight 2/11 = 186# ~  weight 8/11 = 174# ~  weight 12/11 = 171# ~  Weight 4/12 = 168# ~  Weight 7/12 = 171# ~  Weight 11/12 = 188# ~  Weight 1/13 = 196# ~  Weight 3/13 = 205#... She MUST get back on diet, incr exercise, get wt down. ~  Weight 6/13 = 205# ~  Weight 8/13 = 210# ~  Weight 9/13 = 220#... What happened? ~  Weight 11/13 = 225# and BMI= 40 ~  Weight 3/14 = 203# ~  Weight 7/14 = 214# ~  Weight 9/14 = 216# ~  Weight 11/14 = 224# and we reviewed diet, exercise & wt reduction strategies... ~  Weight 4/15= 208#  HYPERCALCEMIA> this was most likely due to sarcoidosis w/ extensive splenic involvement;  Calcium was as hight as 13.7 and returned to normal after the splenectomy. ~  Labs 4/12 showed calcium = 11.1 & this is very disappointing... ~  Labs 7/12 showed Ca= 12.0, Phos= 4.0, ACE= 78 ~  Labs 8-10/12 showed Ca up to 13.4, then down to 10.3 on PRED251md... ~  Labs 11/12 showed Ca= 9.8 on Pred 20-10 qod; we will continue to wean. ~  [place] 9[place] on Pred 10-5 Qod;  We decided to wean Pred to 32m42m... ~  Labs 3/13 showed Ca=10.1 on Pred32mg132m rec to decr to 32mgQ22m.. ~  Labs 6/13 showed Ca=11.0 on Pred5Qod; rec incr to 10mg/59m ~  Labs 8/13 showed Ca=10.0 on Pred10/d; rec  to decr to 32mg/d.20m~  Labs 9/13 showed Ca=9.1 on Pred32mg/d; 54m to decr to 32mgQod..47m  Labs 12/13 showed Ca= 9.2 to 10.5 on Pred 32mgQod...14m Labs 3/14 in hosp showed Ca= 14.5 & disch on Pred20Bid; 3/14 f/u Ca=10.6 & Pred decr to 20mg/d... 64m/14: on Pred20/d and f/u labs showed Ca= 9.4... Rec to decr Pred to 10mg/d & f/77mo... ~  7/47moon Pred10/d and most recent Ca= 9.6... rec to decMarland KitchenMarland KitchenPred to 05-06-09-5 Qod... ~  9/14: Pred was stopped by [facility] during[facility] 9/14 hosp &  serial calcium levels reviewed- 9.6=>10.1; we decided to restart her Pred10-5 QOD for now & try to wean to 20m/d by f/u in 273mo/ repeat labs... ~  11/14: on Pred5m75m and Ca=9.5; rec to decr to 5mg29mternate w/ 2.5mg 23m... ~  3/15: labs from ER showed Ca= 10.3 on Pred5mg/d47m   GERD - on NEXIUM 40mg B32m. w/ increased reflux symptoms w/ noct cough etc...  ~  2/10: discussed optimal Rx w/ Nex before dinner, Zantac300 + Reglan10 at bed, elev HOB, etc. ~  2/11: she stopped the Reglan, still using Nexium/ Zantac but w/ persist symptoms>  refer to GI for eval. ~  6/11: GI f/u DrPerry w/ EGD that was normal... continue Rx. ~  12/11:  increased GI symptoms post hosp> she will f/u w/ DrPerry for his input> incr Nexium Bid. ~  She remains on Nexium 40mgBid40mularly...  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 ~  6/11:  f/u colonoscopy by DrPerry showed divertics, 4 polyps, AVM... path=Marland KitchenMarland Kitchenubular adenoma, f/u 59yrs. ~ 11yr4: she describes abd cramps, sick feeling when she has to go- better after BM, takes Senakot-S and Bentyl10Qid prn...  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 ~  Note: Serum Calcium ret to normal immediately after spleen removed, but hypercalcemia returned after  several months forcing us to [place] w/ Pred...  RENAL INSUFFICIENCY (ICD-588.9) >> Creat ~ 2.0 & eval 10/11 by DrFox- prob due to DM & HBP... ~  Labs 4/12 showed BUN= 27, Creat= 1.6 ~  Labs 7/12 showed BUN= 44, Creat= 1.9 (not on diuretics/ NSAIDs/ etc)... ~  Labs showed Creat up to 2.3 when Ca=13.4; now improved to 1.8 w/ Ca coming down... ~  Labs 11/12 showed BUN=38, Creat=1.7; rec to maintain hydration... ~  Labs 1/13 in Hosp w/ dHenagarsis showed Creat incr to 2.4 but ret to 1.7 w/ adjustment in meds... ~  Labs 3/13 showed BUN=38, Creat= 1.6 ~  Labs 6/13 showed BUN=41, Creat= 2.3; rec> stop Lasix20 for now, liberalize fluids. ~  Labs 9/13 showed BUN=40, Creat=2.1 on Lasix20, rec to continue the same. ~  Labs 12/13 showed Creat= 1.5 ~  Labs 3/14 in hosp showed Creat=4, then decr to 2.00 by dischage... ~  Labs 4/14 showed BUN=51, Creat=2.1, and rec to incr free water as we wean the Pred... 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 ~  11/14: Labs are worse w/ BUN=113, Creat=4.2 after recent gastroent 7 diuretics increased by Cards for wt gain; they have adjusted down & she needs Renal follow up... ~  Labs 3/15 ER showed BUN=61, Cr=2.9; followed by DrSanford & DrAltheimer...  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 on LYRICA 100mg Qhs,4m prev Tramadol Rx... ~  [place] [facility] [place], Lyri64m5mgBid... 43mT, UNSPECIFIED (ICD-274.9) - Uric in the 7-8 range...on ALLOPURINOL 300mg/d for p55mntion...  ANXIETY (ICD-300.00) - she is under mod stress- work, mother died, hospice counselling, etc... ~  8/11:  rec starting Alprazolam 0.5mg Tid for p47mit, SOB, anxiety...Marland KitchenMarland Kitchen  ANEMIA-NOS & MGUS >> see eval 10-11/11 by DrGranfortuna w/ bone marrow  11/11 in hosp= neg. ~  Labs 4/12 showed Hg= 11.6, MCV= 84... On FeSO4 daily. ~  Labs 7/12 showed Hg= 12.8, MCV= 90, Fe= 45 (14%sat)... ~  Labs 1/13 in [place] showed Hg=10.4 but improved to 12.9 on post hosp check in office... ~  Labs 6/13 showed Hg= 12.4 ~  Labs 12/13 showed Hg= 13.9 ~  Labs 3/14 showed Hg= 12.9 ~  Labs 9/14 during hosp showed Hg= 10-11 range... ~  Labs 3/15 showed Hg= 14.8  PSORIASIS - eval and Rx per DrTafeen prev on Humira injections- stopped 9/11...   Past Surgical History  Procedure Laterality Date  . Vesicovaginal fistula closure w/  total abdominal hysterectomy  1992  . Bilateral total knee replacements Bilateral Rt=5/04 & Lft=1/09    by DrAlusio  . 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 Zella Richer  . Appendectomy  1957  . Tonsillectomy  1968  . Cholecystectomy  1980's  . Abdominal hysterectomy  1992  . Reduction mammaplasty Bilateral 1980's  . Cardiac catheterization  1990's  . Thoracentesis  2012    Outpatient Encounter Prescriptions as of 11/19/2013  Medication Sig  . ADVAIR DISKUS 250-50 MCG/DOSE AEPB inhale 1 dose by mouth twice a day  . albuterol (PROVENTIL HFA;VENTOLIN HFA) 108 (90 BASE) MCG/ACT inhaler Inhale 2 puffs into the lungs every 6 (six) hours as needed for wheezing or shortness of breath.  . Alpha-D-Galactosidase (BEANO PO) Take 1 tablet by mouth daily.  . Alum & Mag Hydroxide-Simeth (MAGIC MOUTHWASH) SOLN 1 tsp 4 times daily gargle and swallow  . Apremilast (OTEZLA) 30 MG TABS Take 1 tablet by mouth daily.  Marland Kitchen aspirin EC 81 MG tablet Take 81 mg by mouth daily.  Marland Kitchen atorvastatin (LIPITOR) 20 MG tablet Take 1 tablet (20 mg total) by mouth daily.  Marland Kitchen esomeprazole (NEXIUM) 40 MG capsule Take 1 capsule (40 mg total) by mouth 2 (two) times daily.  . ferrous sulfate 325 (65 FE) MG tablet Take 325 mg by mouth daily with breakfast.  . fluticasone (FLONASE) 50 MCG/ACT nasal spray Place 2 sprays into the nose at bedtime.  . hydrALAZINE (APRESOLINE) 25 MG  tablet Take 25 mg by mouth 3 (three) times daily.  . hydrOXYzine (ATARAX/VISTARIL) 25 MG tablet Take 1 tablet (25 mg total) by mouth every 4 (four) hours as needed.  . insulin detemir (LEVEMIR) 100 UNIT/ML injection Inject 56 Units into the skin 2 (two) times daily.   . insulin lispro (HUMALOG KWIKPEN) 100 UNIT/ML SOPN Take 24 units in the morning, 34 units in the afternoon and 30 units in the evening.  . isosorbide dinitrate (ISORDIL) 20 MG tablet Take 1 tablet (20 mg total) by mouth 3 (three) times daily.  Marland Kitchen ketorolac (ACULAR LS) 0.4 % SOLN Place 1 drop into both eyes 3 (three) times daily.   . Lancets MISC 1 Units by Does not apply route 4 (four) times daily - after meals and at bedtime.  . Linaclotide (LINZESS) 290 MCG CAPS capsule Take 290 mcg by mouth every other day.  Marland Kitchen LORazepam (ATIVAN) 1 MG tablet Take 1/2 to 1 tablet by mouth three times daily as needed  . metoprolol tartrate (LOPRESSOR) 25 MG tablet Take 0.5 tablets (12.5 mg total) by mouth 2 (two) times daily.  . Multiple Vitamins-Minerals (MULTIVITAMIN WITH MINERALS) tablet Take 1 tablet by mouth daily.  . ondansetron (ZOFRAN) 8 MG tablet Take 1 tablet (8 mg total)  by mouth every 8 (eight) hours as needed for nausea.  . predniSONE (DELTASONE) 5 MG tablet Take 5 mg by mouth daily with breakfast.  . pregabalin (LYRICA) 100 MG capsule Take 1 capsule (100 mg total) by mouth every evening.  Marland Kitchen PRESCRIPTION MEDICATION Patient takes a  Medication that she gets from her doctor as samples. Patient does not know the name of this medication. Patient has been on this medication for 3 weeks.  Patient uses for psoriasis  . torsemide (DEMADEX) 20 MG tablet Take 2 tablets (40 mg total) by mouth daily.  . traMADol (ULTRAM) 50 MG tablet Take one tablet by mouth three times daily as needed  . triamcinolone cream (KENALOG) 0.1 % Apply 1 application topically 2 (two) times daily.  . [DISCONTINUED] LORazepam (ATIVAN) 1 MG tablet Take 1/2 to 1 tablet by  mouth three times daily as needed    Allergies  Allergen Reactions  . Adhesive [Tape] Other (See Comments)    Blisters   . Avelox [Moxifloxacin Hcl In Nacl]     GI upset  . Codeine Other (See Comments)    "crazy"   . Guaifenesin Nausea And Vomiting  . Latex Swelling  . Oxycodone Nausea And Vomiting    Current Medications, Allergies, Past Medical History, Past Surgical History, Family History, and Social History were reviewed in [employer] record.   Review of Systems         See HPI - all other systems neg except as noted... The patient complains of dyspnea on exertion.  The patient denies anorexia, fever, weight loss, weight gain, vision loss, decreased hearing, hoarseness, chest pain, syncope, peripheral edema, prolonged cough, headaches, hemoptysis, abdominal pain, melena, hematochezia, severe indigestion/heartburn, hematuria, incontinence, muscle weakness, suspicious skin lesions, transient blindness, difficulty walking, depression, unusual weight change, abnormal bleeding, enlarged lymph nodes, and angioedema.     Objective:   Physical Exam      WD, Overweight, 68 y/o BF in NAD...  GENERAL:  Alert & oriented; pleasant & cooperative... HEENT:  [place]/AT, EOM-wnl, PERRLA, EACs-clear, TMs-wnl, NOSE-clear, THROAT- clear & wnl... NECK:  Supple w/ fair ROM; no JVD; normal carotid impulses w/o bruits; palp thyroid, w/o nodules felt; no lymphadenopathy. CHEST:  Clear to P & A; without wheezes/ rales/ or rhonchi. HEART:  Regular Rhythm; gr 1/6 SEM, without rubs/ or gallops. ABDOMEN:  Scar from splenectomy surg; obese, soft, & nontender w/ panniculus; normal bowel sounds; no organomegaly or masses detected EXT: without deformities, mod arthritic changes, s/p bilat TKRs; no varicose veins/ +venous insuffic/ tr edema. NEURO:  CN's intact; no focal neuro deficits... DERM:  Psoriasis rash per DrTafeen...  RADIOLOGY DATA:  Reviewed in the EPIC EMR & discussed w/ the  patient...  LABORATORY DATA:  Reviewed in the EPIC EMR & discussed w/ the patient...   Assessment & Plan:    SARCOIDOSIS>  Main manifestation has been her hypercalcemia> she has proven that when her Pred is weaned to 34mQod the hypercalcemia returns; we are trying to determine her lowest dose threshold & Ca is now 10.3 on Pred552md; we decided to continue the same dose for now...  Hypercalcemia>  Initially responded to splenectomy but her calcium level rose again over several months after the surg; we were forced to use Pred rx in this difficult diabetic; we had her see DrAltheimer for DM/ endocrine consult; calcium levels have responded nicely to Pred Rx but climbed back up when Pred was weaned to 79m70md early in 2014 (see above)...Marland KitchenMarland Kitchen  ASTHMATIC BRONCHITS>  ER 11/13 bumped her Pred to 70m/d which negated our slow taper w/ monitoring of her Calcium levels; NEB Rx w/ albut seemed to give the the max benefit & we need to wean down the Pred as much as poss; she is also hypoxemic w/ O2sat=84% on RA (likely from V/Q mismatching);  We decided to Rx w/ HomeO2- and she improved over time; now on ADamascusas needed, plus the Pred574mused for her hypercalcemia...  HBP>  Meds were adjusted 9/14 hosp and BP is current wnl on her on Metoprolol25-1/2, Bidil 20-37.5Tid, Demadex20-2/d since disch 9/14; she has f/u CHF clinic...  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  DM>  Control has been difficult;  Stressed diet/ exercise, & get wt down; DrA has her on Levemir & HumalogSS coverage...  Renal Insuffic>  Creat was up to 4 in [place] & then back to 2.9 w/ adjust in meds; now back to 4.2 w/ incr diuretics per Cards; she will f/u w/ DrSanford for Nephrology...  Other medical problems as noted...   Patient's Medications  New Prescriptions   No medications on file  Previous Medications   ADVAIR DISKUS 250-50 MCG/DOSE AEPB    inhale 1  dose by mouth twice a day   ALBUTEROL (PROVENTIL HFA;VENTOLIN HFA) 108 (90 BASE) MCG/ACT INHALER    Inhale 2 puffs into the lungs every 6 (six) hours as needed for wheezing or shortness of breath.   ALPHA-D-GALACTOSIDASE (BEANO PO)    Take 1 tablet by mouth daily.   ALUM & MAG HYDROXIDE-SIMETH (MAGIC MOUTHWASH) SOLN    1 tsp 4 times daily gargle and swallow   APREMILAST (OTEZLA) 30 MG TABS    Take 1 tablet by mouth daily.   ASPIRIN EC 81 MG TABLET    Take 81 mg by mouth daily.   ATORVASTATIN (LIPITOR) 20 MG TABLET    Take 1 tablet (20 mg total) by mouth daily.   ESOMEPRAZOLE (NEXIUM) 40 MG CAPSULE    Take 1 capsule (40 mg total) by mouth 2 (two) times daily.   FERROUS SULFATE 325 (65 FE) MG TABLET    Take 325 mg by mouth daily with breakfast.   FLUTICASONE (FLONASE) 50 MCG/ACT NASAL SPRAY    Place 2 sprays into the nose at bedtime.   HYDRALAZINE (APRESOLINE) 25 MG TABLET    Take 25 mg by mouth 3 (three) times daily.   HYDROXYZINE (ATARAX/VISTARIL) 25 MG TABLET    Take 1 tablet (25 mg total) by mouth every 4 (four) hours as needed.   INSULIN DETEMIR (LEVEMIR) 100 UNIT/ML INJECTION    Inject 56 Units into the skin 2 (two) times daily.    INSULIN LISPRO (HUMALOG KWIKPEN) 100 UNIT/ML SOPN    Take 24 units in the morning, 34 units in the afternoon and 30 units in the evening.   ISOSORBIDE DINITRATE (ISORDIL) 20 MG TABLET    Take 1 tablet (20 mg total) by mouth 3 (three) times daily.   KETOROLAC (ACULAR LS) 0.4 % SOLN    Place 1 drop into both eyes 3 (three) times daily.    LANCETS MISC    1 Units by Does not apply route 4 (four) times daily - after meals and at bedtime.   LINACLOTIDE (LINZESS) 290 MCG CAPS CAPSULE    Take 290 mcg by mouth every other day.   METOPROLOL TARTRATE (LOPRESSOR) 25 MG TABLET  Take 0.5 tablets (12.5 mg total) by mouth 2 (two) times daily.   MULTIPLE VITAMINS-MINERALS (MULTIVITAMIN WITH MINERALS) TABLET    Take 1 tablet by mouth daily.   ONDANSETRON (ZOFRAN) 8 MG TABLET     Take 1 tablet (8 mg total) by mouth every 8 (eight) hours as needed for nausea.   PREDNISONE (DELTASONE) 5 MG TABLET    Take 5 mg by mouth daily with breakfast.   PREGABALIN (LYRICA) 100 MG CAPSULE    Take 1 capsule (100 mg total) by mouth every evening.   PRESCRIPTION MEDICATION    Patient takes a  Medication that she gets from her doctor as samples. Patient does not know the name of this medication. Patient has been on this medication for 3 weeks.  Patient uses for psoriasis   TORSEMIDE (DEMADEX) 20 MG TABLET    Take 2 tablets (40 mg total) by mouth daily.   TRAMADOL (ULTRAM) 50 MG TABLET    Take one tablet by mouth three times daily as needed   TRIAMCINOLONE CREAM (KENALOG) 0.1 %    Apply 1 application topically 2 (two) times daily.  Modified Medications   Modified Medication Previous Medication   LORAZEPAM (ATIVAN) 1 MG TABLET LORazepam (ATIVAN) 1 MG tablet      Take 1/2 to 1 tablet by mouth three times daily as needed    Take 1/2 to 1 tablet by mouth three times daily as needed  Discontinued Medications   No medications on file

## 2013-11-19 NOTE — Telephone Encounter (Signed)
Eric w/ [employer] would like to know status of PA.  Can be reached at [phone].  PA should be faxed to [phone].  Satira Anis

## 2013-11-19 NOTE — Telephone Encounter (Signed)
PA for the nexium has been faxed.

## 2013-11-19 NOTE — Telephone Encounter (Signed)
[name] has this been faxed back? thanks

## 2013-11-20 ENCOUNTER — Telehealth: Payer: Self-pay | Admitting: Pulmonary Disease

## 2013-11-20 ENCOUNTER — Encounter (HOSPITAL_COMMUNITY)
Admission: RE | Admit: 2013-11-20 | Discharge: 2013-11-20 | Disposition: A | Discharge status: Home / Self Care | Payer: Self-pay | Source: Ambulatory Visit | Attending: Pulmonary Disease | Admitting: Pulmonary Disease

## 2013-11-20 NOTE — Telephone Encounter (Signed)
noted 

## 2013-11-20 NOTE — Telephone Encounter (Signed)
See phone note 11/20/13. This was approved

## 2013-11-22 ENCOUNTER — Encounter (HOSPITAL_COMMUNITY)
Admission: RE | Admit: 2013-11-22 | Discharge: 2013-11-22 | Disposition: A | Discharge status: Home / Self Care | Payer: Self-pay | Source: Ambulatory Visit | Attending: Pulmonary Disease | Admitting: Pulmonary Disease

## 2013-11-26 ENCOUNTER — Other Ambulatory Visit: Payer: Self-pay | Admitting: *Deleted

## 2013-11-26 DIAGNOSIS — N184 Chronic kidney disease, stage 4 (severe): Secondary | ICD-10-CM

## 2013-11-26 DIAGNOSIS — Z01810 Encounter for preprocedural cardiovascular examination: Secondary | ICD-10-CM

## 2013-11-27 ENCOUNTER — Encounter (HOSPITAL_COMMUNITY)
Admission: RE | Admit: 2013-11-27 | Discharge: 2013-11-27 | Disposition: A | Discharge status: Home / Self Care | Payer: Self-pay | Source: Ambulatory Visit | Attending: Pulmonary Disease | Admitting: Pulmonary Disease

## 2013-11-29 ENCOUNTER — Encounter (HOSPITAL_COMMUNITY)
Admission: RE | Admit: 2013-11-29 | Discharge: 2013-11-29 | Disposition: A | Discharge status: Home / Self Care | Payer: Self-pay | Source: Ambulatory Visit | Attending: Pulmonary Disease | Admitting: Pulmonary Disease

## 2013-12-04 ENCOUNTER — Encounter (HOSPITAL_COMMUNITY)
Admission: RE | Admit: 2013-12-04 | Discharge: 2013-12-04 | Disposition: A | Discharge status: Home / Self Care | Payer: Self-pay | Source: Ambulatory Visit | Attending: Pulmonary Disease | Admitting: Pulmonary Disease

## 2013-12-04 DIAGNOSIS — Z5189 Encounter for other specified aftercare: Secondary | ICD-10-CM | POA: Insufficient documentation

## 2013-12-04 DIAGNOSIS — J45909 Unspecified asthma, uncomplicated: Secondary | ICD-10-CM | POA: Insufficient documentation

## 2013-12-06 ENCOUNTER — Ambulatory Visit: Payer: Medicare Other | Admitting: Pulmonary Disease

## 2013-12-06 ENCOUNTER — Encounter (HOSPITAL_COMMUNITY)
Admission: RE | Admit: 2013-12-06 | Discharge: 2013-12-06 | Disposition: A | Discharge status: Home / Self Care | Payer: Self-pay | Source: Ambulatory Visit | Attending: Pulmonary Disease | Admitting: Pulmonary Disease

## 2013-12-11 ENCOUNTER — Encounter (HOSPITAL_COMMUNITY): Payer: Self-pay

## 2013-12-12 ENCOUNTER — Ambulatory Visit: Payer: Medicare Other | Admitting: Pulmonary Disease

## 2013-12-12 ENCOUNTER — Encounter: Payer: Self-pay | Admitting: Pulmonary Disease

## 2013-12-12 VITALS — BP 136/64 | HR 75 | Ht 62.0 in | Wt 210.0 lb

## 2013-12-12 DIAGNOSIS — G4733 Obstructive sleep apnea (adult) (pediatric): Secondary | ICD-10-CM | POA: Diagnosis not present

## 2013-12-12 NOTE — Assessment & Plan Note (Signed)
She reports snoring, sleep disruption, apnea, and daytime sleepiness.  She has hx of HTN, sarcoidosis, COPD and diastolic heart failure.  She c/o nocturnal leg cramps.   I am concerned she still has sleep apnea.    We discussed how sleep apnea can affect various health problems including risks for hypertension, cardiovascular disease, and diabetes.  We also discussed how sleep disruption can increase risks for accident, such as while driving.  Weight loss as a means of improving sleep apnea was also reviewed.  Additional treatment options discussed were CPAP therapy, oral appliance, and surgical intervention.  To further assess will arrange for in lab sleep study.

## 2013-12-12 NOTE — Progress Notes (Signed)
Chief Complaint  Patient presents with  . Advice Only    Sleep consult- reestablish care.  Last seen in 2008.  Pt has cpap, does not wear it.  c/o restlessness, unable to fall asleep at night. Wakes up breathless every 1-2 hours through the night.     History of Present Illness: [name] is a 68 y.o. female for evaluation of sleep problems.  She has hx of sarcoidosis.  She had sleep study in 2007.  She was started on CPAP.  She stopped using this.  She had trouble with mask fit, and noise from the machine.  Her sleep has been getting worse.  She is snoring more, and wakes up feeling like she can't breath.  This makes her feel panicked.  She has trouble falling asleep, and staying asleep.  She will try to watch TV or lay in bed.  She goes to sleep at 10 pm.  She falls asleep after 1 to 2 hours.  She wakes up 1 to 2 times to use the bathroom.  She gets out of bed at 8 am.  She feels tired in the morning.  She has been getting more frequent morning headaches.  She takes lyrica to help with neuropathy.  She gets frequent cramps in her legs.  She does not use anything to help her stay awake.  She denies sleep walking, sleep talking, bruxism, or nightmares.  There is no history of restless legs.  She denies sleep hallucinations, sleep paralysis, or cataplexy.  The Epworth score is 13 out of 24.  Tests: PSG November 2007 >> RDI 21, SaO2 low 62% Echo 04/03/13 >> EF 60 to 65%, mild LVH, grade 2 diastolic dysfx, mild AS  [name]  has a past medical history of Unspecified essential hypertension; CAD (coronary artery disease) (01/04/2006); Asthma; Esophageal reflux; Obesity, unspecified; Anxiety state, unspecified; Hypercalcemia; Anemia; Other psoriasis; Diverticulosis; Sarcoidosis (2012); Dyslipidemia; COPD (chronic obstructive pulmonary disease); Gout; Colon polyps; Spinal stenosis of lumbar region; Bulging lumbar disc; CHF (congestive heart failure); PNA (pneumonia) (2012); Pneumonia;  Pleural effusion (2012); Chronic bronchitis; Exertional shortness of breath; On home oxygen therapy; OSA (obstructive sleep apnea); Type II diabetes mellitus; Degenerative joint disease; Arthritis; Chronic lower back pain; Kidney disease; Renal failure; Diverticulosis; GERD (gastroesophageal reflux disease); Anxiety; Renal insufficiency; and Hyperlipidemia.  Fenton  has past surgical history that includes VESICOVAGINAL FISTULA CLOSURE W/  total abdominal hysterectomy (1992); BILATERAL TOTAL KNEE REPLACEMENTS (Bilateral, Rt=5/04 & Lft=1/09); OPEN SPLENECTOMY (09/2010); Appendectomy (1957); Tonsillectomy (1968); Cholecystectomy (1980's); Abdominal hysterectomy (1992); Reduction mammaplasty (Bilateral, 1980's); Cardiac catheterization (7782'U); and Thoracentesis (2012).  Prior to Admission medications   Medication Sig Start Date End Date Taking? Authorizing Provider  ADVAIR DISKUS 250-50 MCG/DOSE AEPB inhale 1 dose by mouth twice a day 08/25/13  Yes Noralee Space, MD  albuterol (PROVENTIL HFA;VENTOLIN HFA) 108 (90 BASE) MCG/ACT inhaler Inhale 2 puffs into the lungs every 6 (six) hours as needed for wheezing or shortness of breath.   Yes Historical Provider, MD  Alpha-D-Galactosidase (BEANO PO) Take 1 tablet by mouth daily.   Yes Historical Provider, MD  Alum & Mag Hydroxide-Simeth (MAGIC MOUTHWASH) SOLN 1 tsp 4 times daily gargle and swallow 05/11/13  Yes Noralee Space, MD  Apremilast (OTEZLA) 30 MG TABS Take 1 tablet by mouth daily.   Yes Historical Provider, MD  aspirin EC 81 MG tablet Take 81 mg by mouth daily.   Yes Historical Provider, MD  atorvastatin (LIPITOR) 20 MG tablet Take  1 tablet (20 mg total) by mouth daily. 06/21/13  Yes Noralee Space, MD  esomeprazole (NEXIUM) 40 MG capsule Take 1 capsule (40 mg total) by mouth 2 (two) times daily. 11/14/13  Yes Noralee Space, MD  ferrous sulfate 325 (65 FE) MG tablet Take 325 mg by mouth daily with breakfast.   Yes Historical Provider, MD   fluticasone (FLONASE) 50 MCG/ACT nasal spray Place 2 sprays into the nose at bedtime. 02/14/13  Yes Noralee Space, MD  hydrALAZINE (APRESOLINE) 25 MG tablet Take 25 mg by mouth 3 (three) times daily. 08/20/13  Yes Noralee Space, MD  hydrOXYzine (ATARAX/VISTARIL) 25 MG tablet Take 1 tablet (25 mg total) by mouth every 4 (four) hours as needed. 06/19/13  Yes Noralee Space, MD  insulin detemir (LEVEMIR) 100 UNIT/ML injection Inject 50 Units into the skin 2 (two) times daily.  04/10/13  Yes Charlynne Cousins, MD  insulin lispro (HUMALOG KWIKPEN) 100 UNIT/ML SOPN Take 30 units in the morning, 40 units in the afternoon and 40 units in the evening.   Yes Historical Provider, MD  isosorbide dinitrate (ISORDIL) 20 MG tablet Take 1 tablet (20 mg total) by mouth 3 (three) times daily. 08/20/13  Yes Noralee Space, MD  ketorolac (ACULAR LS) 0.4 % SOLN Place 1 drop into both eyes 3 (three) times daily.    Yes Historical Provider, MD  Lancets MISC 1 Units by Does not apply route 4 (four) times daily - after meals and at bedtime.   Yes Historical Provider, MD  Linaclotide (LINZESS) 290 MCG CAPS capsule Take 290 mcg by mouth every other day. 06/14/13  Yes Irene Shipper, MD  LORazepam (ATIVAN) 1 MG tablet Take 1/2 to 1 tablet by mouth three times daily as needed 11/19/13  Yes Noralee Space, MD  metoprolol tartrate (LOPRESSOR) 25 MG tablet Take 0.5 tablets (12.5 mg total) by mouth 2 (two) times daily. 05/01/13  Yes Larey Dresser, MD  Multiple Vitamins-Minerals (MULTIVITAMIN WITH MINERALS) tablet Take 1 tablet by mouth daily.   Yes Historical Provider, MD  ondansetron (ZOFRAN) 8 MG tablet Take 1 tablet (8 mg total) by mouth every 8 (eight) hours as needed for nausea. 10/14/13  Yes Orlie Dakin, MD  predniSONE (DELTASONE) 5 MG tablet Take 5 mg by mouth daily with breakfast.   Yes Historical Provider, MD  pregabalin (LYRICA) 100 MG capsule Take 1 capsule (100 mg total) by mouth every evening. 11/14/13  Yes Noralee Space, MD   PRESCRIPTION MEDICATION Patient takes a  Medication that she gets from her doctor as samples. Patient does not know the name of this medication. Patient has been on this medication for 3 weeks.  Patient uses for psoriasis   Yes Historical Provider, MD  torsemide (DEMADEX) 20 MG tablet Take 2 tablets (40 mg total) by mouth daily. 05/03/13  Yes Jolaine Artist, MD  traMADol Veatrice Bourbon) 50 MG tablet Take one tablet by mouth three times daily as needed 11/12/13  Yes Noralee Space, MD  triamcinolone cream (KENALOG) 0.1 % Apply 1 application topically 2 (two) times daily.   Yes Historical Provider, MD    Allergies  Allergen Reactions  . Adhesive [Tape] Other (See Comments)    Blisters   . Avelox [Moxifloxacin Hcl In Nacl]     GI upset  . Codeine Other (See Comments)    "crazy"   . Guaifenesin Nausea And Vomiting  . Latex Swelling  . Oxycodone Nausea And Vomiting  Her family history includes Breast cancer in her maternal aunt; Diabetes in her mother; Heart disease in her brother, father, mother, and sister.  She  reports that she has never smoked. She has never used smokeless tobacco. She reports that she does not drink alcohol or use illicit drugs.  Review of Systems  Constitutional: Negative for fever and unexpected weight change.  HENT: Negative for congestion, dental problem, ear pain, nosebleeds, postnasal drip, rhinorrhea, sinus pressure, sneezing, sore throat and trouble swallowing.   Eyes: Negative for redness and itching.  Respiratory: Positive for shortness of breath. Negative for cough, chest tightness and wheezing.   Cardiovascular: Negative for palpitations and leg swelling.  Gastrointestinal: Negative for nausea and vomiting.  Genitourinary: Negative for dysuria.  Musculoskeletal: Negative for joint swelling.  Skin: Negative for rash.  Neurological: Negative for headaches.  Hematological: Does not bruise/bleed easily.  Psychiatric/Behavioral: Negative for dysphoric mood.  The patient is not nervous/anxious.    Physical Exam:  General - No distress ENT - No sinus tenderness, no oral exudate, no LAN, no thyromegaly, TM clear, pupils equal/reactive, MP 3, wears dentures Cardiac - s1s2 regular, no murmur, pulses symmetric Chest - No wheeze/rales/dullness, decreased breath sounds Back - No focal tenderness Abd - Soft, non-tender, no organomegaly, + bowel sounds Ext - No edema Neuro - Normal strength, cranial nerves intact Skin - No rashes Psych - Normal mood, and behavior  Assessment/plan:  Chesley Mires, M.D. Pager [phone]

## 2013-12-12 NOTE — Progress Notes (Deleted)
   Subjective:    Patient ID: [name], female    DOB: [birth year], 68 y.o.   MRN: [id number]  HPI    Review of Systems  Constitutional: Negative for fever and unexpected weight change.  HENT: Negative for congestion, dental problem, ear pain, nosebleeds, postnasal drip, rhinorrhea, sinus pressure, sneezing, sore throat and trouble swallowing.   Eyes: Negative for redness and itching.  Respiratory: Positive for shortness of breath. Negative for cough, chest tightness and wheezing.   Cardiovascular: Negative for palpitations and leg swelling.  Gastrointestinal: Negative for nausea and vomiting.  Genitourinary: Negative for dysuria.  Musculoskeletal: Negative for joint swelling.  Skin: Negative for rash.  Neurological: Negative for headaches.  Hematological: Does not bruise/bleed easily.  Psychiatric/Behavioral: Negative for dysphoric mood. The patient is not nervous/anxious.        Objective:   Physical Exam        Assessment & Plan:

## 2013-12-12 NOTE — Patient Instructions (Signed)
Will arrange for sleep study Will call to arrange for follow up after sleep study reviewed 

## 2013-12-13 ENCOUNTER — Encounter (HOSPITAL_COMMUNITY)
Admission: RE | Admit: 2013-12-13 | Discharge: 2013-12-13 | Disposition: A | Discharge status: Home / Self Care | Payer: Self-pay | Source: Ambulatory Visit | Attending: Pulmonary Disease | Admitting: Pulmonary Disease

## 2013-12-18 ENCOUNTER — Encounter (HOSPITAL_COMMUNITY)
Admission: RE | Admit: 2013-12-18 | Discharge: 2013-12-18 | Disposition: A | Discharge status: Home / Self Care | Payer: Self-pay | Source: Ambulatory Visit | Attending: Pulmonary Disease | Admitting: Pulmonary Disease

## 2013-12-19 ENCOUNTER — Encounter: Payer: Self-pay | Admitting: Vascular Surgery

## 2013-12-20 ENCOUNTER — Ambulatory Visit (INDEPENDENT_AMBULATORY_CARE_PROVIDER_SITE_OTHER)
Admission: RE | Admit: 2013-12-20 | Discharge: 2013-12-20 | Disposition: A | Discharge status: Home / Self Care | Payer: Medicare Other | Source: Ambulatory Visit | Attending: Vascular Surgery | Admitting: Vascular Surgery

## 2013-12-20 ENCOUNTER — Encounter (HOSPITAL_COMMUNITY)
Admission: RE | Admit: 2013-12-20 | Discharge: 2013-12-20 | Disposition: A | Discharge status: Home / Self Care | Payer: Self-pay | Source: Ambulatory Visit | Attending: Pulmonary Disease | Admitting: Pulmonary Disease

## 2013-12-20 ENCOUNTER — Ambulatory Visit (HOSPITAL_COMMUNITY)
Admission: RE | Admit: 2013-12-20 | Discharge: 2013-12-20 | Disposition: A | Discharge status: Home / Self Care | Payer: Medicare Other | Source: Ambulatory Visit | Attending: Vascular Surgery | Admitting: Vascular Surgery

## 2013-12-20 ENCOUNTER — Ambulatory Visit (INDEPENDENT_AMBULATORY_CARE_PROVIDER_SITE_OTHER): Payer: Medicare Other | Admitting: Vascular Surgery

## 2013-12-20 ENCOUNTER — Other Ambulatory Visit: Payer: Self-pay

## 2013-12-20 ENCOUNTER — Encounter: Payer: Self-pay | Admitting: Vascular Surgery

## 2013-12-20 VITALS — BP 183/64 | HR 81 | Ht 62.0 in | Wt 214.0 lb

## 2013-12-20 DIAGNOSIS — N184 Chronic kidney disease, stage 4 (severe): Secondary | ICD-10-CM

## 2013-12-20 DIAGNOSIS — Z01810 Encounter for preprocedural cardiovascular examination: Secondary | ICD-10-CM

## 2013-12-20 DIAGNOSIS — N186 End stage renal disease: Secondary | ICD-10-CM | POA: Insufficient documentation

## 2013-12-20 LAB — GLUCOSE, CAPILLARY: Glucose-Capillary: 202 mg/dL — ABNORMAL HIGH (ref 70–99)

## 2013-12-20 NOTE — Progress Notes (Signed)
[facility] HISTORY AND PHYSICAL   History of Present Illness:  Patient is a 68 y.o. year old female who presents for placement of a permanent hemodialysis access. The patient is right handed.  The patient is not currently on hemodialysis.  The cause of renal failure is thought to be secondary to diabetes .  Other chronic medical problems include coronary artery disease, asthma, COPD requiring home oxygen, sleep apnea. These are all currently controlled. She has had no prior access procedures..  Past Medical History  Diagnosis Date  . Unspecified essential hypertension   . CAD (coronary artery disease) 01/04/2006    Tiny OM1 70-90% ostial stenosis, no other CAD  . Asthma   . Esophageal reflux   . Obesity, unspecified   . Anxiety state, unspecified   . Hypercalcemia   . Anemia   . Other psoriasis   . Diverticulosis   . Sarcoidosis 2012  . Dyslipidemia   . COPD (chronic obstructive pulmonary disease)   . Gout   . Colon polyps     Tubular adenomatous polyps  . Spinal stenosis of lumbar region   . Bulging lumbar disc   . CHF (congestive heart failure)   . PNA (pneumonia) 2012    With pleural effusion, requiring thoracentesis  . Pneumonia     "several times" (04/02/2013)  . Pleural effusion 2012  . Chronic bronchitis   . Exertional shortness of breath     "and lying flat" (04/02/2013)  . On home oxygen therapy     "2L when I'm up; 3L when I'm at sleep" (04/02/2013)  . OSA (obstructive sleep apnea)     marginally compliant with CPAP  . Type II diabetes mellitus   . Degenerative joint disease   . Arthritis     'all over" (04/02/2013)  . Chronic lower back pain   . Kidney disease     baseline creatinie 2.0,  folowed by Dr Hassell Done  . Renal failure     "kidney's are working @ 50%" (04/02/2013)  . Diverticulosis   . GERD (gastroesophageal reflux disease)   . Anxiety   . Renal insufficiency   . Hyperlipidemia   . Sarcoidosis of lung     Past Surgical History   Procedure Laterality Date  . Vesicovaginal fistula closure w/  total abdominal hysterectomy  1992  . Bilateral total knee replacements Bilateral Rt=5/04 & Lft=1/09    by DrAlusio  . 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 Zella Richer  . Appendectomy  1957  . Tonsillectomy  1968  . Cholecystectomy  1980's  . Abdominal hysterectomy  1992  . Reduction mammaplasty Bilateral 1980's  . Cardiac catheterization  1990's  . Thoracentesis  2012  . Joint replacement       Social History History  Substance Use Topics  . Smoking status: Never Smoker   . Smokeless tobacco: Never Used  . Alcohol Use: No    Family History Family History  Problem Relation Age of Onset  . Diabetes Mother   . Heart disease Mother   . Hyperlipidemia Mother   . Varicose Veins Mother   . Breast cancer Maternal Aunt   . Heart disease Father   . Deep vein thrombosis Father   . Hyperlipidemia Father   . Heart disease Sister     before age 86  . Cancer Sister   . Diabetes Sister   . Hyperlipidemia Sister   . Heart attack Sister   . Heart disease Brother   .  Diabetes Brother   . Hyperlipidemia Brother   . Heart attack Brother   . Hyperlipidemia Sister     Allergies  Allergies  Allergen Reactions  . Adhesive [Tape] Other (See Comments)    Blisters   . Avelox [Moxifloxacin Hcl In Nacl]     GI upset  . Codeine Other (See Comments)    "crazy"   . Guaifenesin Nausea And Vomiting  . Latex Swelling  . Oxycodone Nausea And Vomiting     Current Outpatient Prescriptions  Medication Sig Dispense Refill  . ADVAIR DISKUS 250-50 MCG/DOSE AEPB inhale 1 dose by mouth twice a day  60 each  11  . albuterol (PROVENTIL HFA;VENTOLIN HFA) 108 (90 BASE) MCG/ACT inhaler Inhale 2 puffs into the lungs every 6 (six) hours as needed for wheezing or shortness of breath.      . Alpha-D-Galactosidase (BEANO PO) Take 1 tablet by mouth daily.      . Alum & Mag Hydroxide-Simeth (MAGIC MOUTHWASH) SOLN 1 tsp 4 times daily gargle  and swallow  120 mL  0  . Apremilast (OTEZLA) 30 MG TABS Take 1 tablet by mouth daily.      Marland Kitchen aspirin EC 81 MG tablet Take 81 mg by mouth daily.      Marland Kitchen atorvastatin (LIPITOR) 20 MG tablet Take 1 tablet (20 mg total) by mouth daily.  90 tablet  3  . esomeprazole (NEXIUM) 40 MG capsule Take 1 capsule (40 mg total) by mouth 2 (two) times daily.  60 capsule  3  . ferrous sulfate 325 (65 FE) MG tablet Take 325 mg by mouth daily with breakfast.      . fluticasone (FLONASE) 50 MCG/ACT nasal spray Place 2 sprays into the nose at bedtime.  48 g  3  . hydrALAZINE (APRESOLINE) 25 MG tablet Take 25 mg by mouth 3 (three) times daily.      . hydrOXYzine (ATARAX/VISTARIL) 25 MG tablet Take 1 tablet (25 mg total) by mouth every 4 (four) hours as needed.  100 tablet  5  . insulin detemir (LEVEMIR) 100 UNIT/ML injection Inject 50 Units into the skin 2 (two) times daily.       . insulin lispro (HUMALOG KWIKPEN) 100 UNIT/ML SOPN Take 30 units in the morning, 40 units in the afternoon and 40 units in the evening.      . isosorbide dinitrate (ISORDIL) 20 MG tablet Take 1 tablet (20 mg total) by mouth 3 (three) times daily.  270 tablet  1  . ketorolac (ACULAR LS) 0.4 % SOLN Place 1 drop into both eyes 3 (three) times daily.       . Lancets MISC 1 Units by Does not apply route 4 (four) times daily - after meals and at bedtime.      . Linaclotide (LINZESS) 290 MCG CAPS capsule Take 290 mcg by mouth every other day.      Marland Kitchen LORazepam (ATIVAN) 1 MG tablet Take 1/2 to 1 tablet by mouth three times daily as needed  50 tablet  5  . metoprolol tartrate (LOPRESSOR) 25 MG tablet Take 0.5 tablets (12.5 mg total) by mouth 2 (two) times daily.  90 tablet  3  . Multiple Vitamins-Minerals (MULTIVITAMIN WITH MINERALS) tablet Take 1 tablet by mouth daily.      . ondansetron (ZOFRAN) 8 MG tablet Take 1 tablet (8 mg total) by mouth every 8 (eight) hours as needed for nausea.  12 tablet  0  . predniSONE (DELTASONE) 5 MG tablet Take 5  mg by  mouth daily with breakfast.      . pregabalin (LYRICA) 100 MG capsule Take 1 capsule (100 mg total) by mouth every evening.  30 capsule  3  . PRESCRIPTION MEDICATION Patient takes a  Medication that she gets from her doctor as samples. Patient does not know the name of this medication. Patient has been on this medication for 3 weeks.  Patient uses for psoriasis      . torsemide (DEMADEX) 20 MG tablet Take 2 tablets (40 mg total) by mouth daily.  60 tablet  3  . traMADol (ULTRAM) 50 MG tablet Take one tablet by mouth three times daily as needed  50 tablet  1  . triamcinolone cream (KENALOG) 0.1 % Apply 1 application topically 2 (two) times daily.       No current facility-administered medications for this visit.    ROS:   General:  No weight loss, Fever, chills  HEENT: No recent headaches, no nasal bleeding, no visual changes, no sore throat  Neurologic: No dizziness, blackouts, seizures. No recent symptoms of stroke or mini- stroke. No recent episodes of slurred speech, or temporary blindness.  Cardiac: No recent episodes of chest pain/pressure, + shortness of breath at rest.  + shortness of breath with exertion.  Denies history of atrial fibrillation or irregular heartbeat  Vascular: No history of rest pain in feet.  No history of claudication.  No history of non-healing ulcer, No history of DVT   Pulmonary: + home oxygen, no productive cough, no hemoptysis,  + asthma or wheezing  Musculoskeletal:  [ ]  Arthritis, [ ]  Low back pain,  [ ]  Joint pain  Hematologic:No history of hypercoagulable state.  No history of easy bleeding.  No history of anemia  Gastrointestinal: No hematochezia or melena,  No gastroesophageal reflux, no trouble swallowing  Urinary: [ ]  chronic Kidney disease, [ ]  on HD - [ ]  MWF or [ ]  TTHS, [ ]  Burning with urination, [ ]  Frequent urination, [ ]  Difficulty urinating;   Skin: No rashes  Psychological: No history of anxiety,  No history of  depression   Physical Examination  Filed Vitals:   12/20/13 1553  BP: 183/64  Pulse: 81  Height: 5\' 2"  (1.575 m)  Weight: 214 lb (97.07 kg)  SpO2: 100%    Body mass index is 39.13 kg/(m^2).  General:  Alert and oriented, no acute distress HEENT: Normal Neck: No bruit or JVD Pulmonary: Clear to auscultation bilaterally Cardiac: Regular Rate and Rhythm without murmur Gastrointestinal: Soft, non-tender, non-distended, no mass, obese Skin: No rash Extremity Pulses:  2+ radial, brachial pulses bilaterally Musculoskeletal: No deformity or edema  Neurologic: Upper and lower extremity motor 5/5 and symmetric  DATA: The patient had a vein mapping ultrasound today which I reviewed and interpreted. The left cephalic vein is 3 mm in the forearm 4-5 mm in the upper arm in the right arm the vein is 2 mm in the forearm 4-5 mm in the upper arm left and right basilic vein was 3 mm.   ASSESSMENT:   Needs hemodialysis access   PLAN:  Patient seems adequate vein for a left radiocephalic fistula if at the time of exploration of the left wrist the vein is inadequate we would proceed with a left brachiocephalic fistula. Risks benefits possible complications and procedure details including but not limited to fistula non-maturation bleeding infection ischemic steal were explained the patient and her husband today. She understands and agrees to proceed. Procedure is scheduled  for 12/31/2013.  Ruta Hinds, MD [facility] Office: [phone] Pager: [phone]

## 2013-12-21 ENCOUNTER — Other Ambulatory Visit: Payer: Self-pay | Admitting: Pulmonary Disease

## 2013-12-25 ENCOUNTER — Encounter (HOSPITAL_COMMUNITY)
Admission: RE | Admit: 2013-12-25 | Discharge: 2013-12-25 | Disposition: A | Discharge status: Home / Self Care | Payer: Self-pay | Source: Ambulatory Visit | Attending: Pulmonary Disease | Admitting: Pulmonary Disease

## 2013-12-27 ENCOUNTER — Encounter (HOSPITAL_COMMUNITY)
Admission: RE | Admit: 2013-12-27 | Discharge: 2013-12-27 | Disposition: A | Discharge status: Home / Self Care | Payer: Self-pay | Source: Ambulatory Visit | Attending: Pulmonary Disease | Admitting: Pulmonary Disease

## 2013-12-28 ENCOUNTER — Encounter (HOSPITAL_COMMUNITY): Payer: Self-pay | Admitting: Pharmacy Technician

## 2013-12-28 ENCOUNTER — Encounter (HOSPITAL_COMMUNITY): Payer: Self-pay | Admitting: *Deleted

## 2013-12-30 MED ORDER — DEXTROSE 5 % IV SOLN
1.5000 g | INTRAVENOUS | Status: AC
  Administered 2013-12-31: 1.5 g via INTRAVENOUS
  Filled 2013-12-30: qty 1.5

## 2013-12-31 ENCOUNTER — Telehealth: Payer: Self-pay | Admitting: Vascular Surgery

## 2013-12-31 ENCOUNTER — Encounter (HOSPITAL_COMMUNITY): Payer: Medicare Other | Admitting: Anesthesiology

## 2013-12-31 ENCOUNTER — Ambulatory Visit (HOSPITAL_COMMUNITY): Payer: Medicare Other | Admitting: Anesthesiology

## 2013-12-31 ENCOUNTER — Encounter (HOSPITAL_COMMUNITY): Payer: Self-pay | Admitting: Anesthesiology

## 2013-12-31 ENCOUNTER — Other Ambulatory Visit: Payer: Self-pay | Admitting: *Deleted

## 2013-12-31 ENCOUNTER — Ambulatory Visit (HOSPITAL_COMMUNITY)
Admission: RE | Admit: 2013-12-31 | Discharge: 2013-12-31 | Disposition: A | Discharge status: Home / Self Care | Payer: Medicare Other | Source: Ambulatory Visit | Attending: Vascular Surgery | Admitting: Vascular Surgery

## 2013-12-31 ENCOUNTER — Encounter (HOSPITAL_COMMUNITY)
Admission: RE | Disposition: A | Discharge status: Home / Self Care | Payer: Self-pay | Source: Ambulatory Visit | Attending: Vascular Surgery

## 2013-12-31 DIAGNOSIS — N184 Chronic kidney disease, stage 4 (severe): Secondary | ICD-10-CM

## 2013-12-31 DIAGNOSIS — N186 End stage renal disease: Secondary | ICD-10-CM

## 2013-12-31 DIAGNOSIS — E785 Hyperlipidemia, unspecified: Secondary | ICD-10-CM | POA: Insufficient documentation

## 2013-12-31 DIAGNOSIS — Z9981 Dependence on supplemental oxygen: Secondary | ICD-10-CM | POA: Insufficient documentation

## 2013-12-31 DIAGNOSIS — Z96659 Presence of unspecified artificial knee joint: Secondary | ICD-10-CM | POA: Insufficient documentation

## 2013-12-31 DIAGNOSIS — L408 Other psoriasis: Secondary | ICD-10-CM | POA: Insufficient documentation

## 2013-12-31 DIAGNOSIS — Z6839 Body mass index (BMI) 39.0-39.9, adult: Secondary | ICD-10-CM | POA: Insufficient documentation

## 2013-12-31 DIAGNOSIS — K219 Gastro-esophageal reflux disease without esophagitis: Secondary | ICD-10-CM | POA: Insufficient documentation

## 2013-12-31 DIAGNOSIS — Z79899 Other long term (current) drug therapy: Secondary | ICD-10-CM | POA: Insufficient documentation

## 2013-12-31 DIAGNOSIS — Z794 Long term (current) use of insulin: Secondary | ICD-10-CM | POA: Insufficient documentation

## 2013-12-31 DIAGNOSIS — I120 Hypertensive chronic kidney disease with stage 5 chronic kidney disease or end stage renal disease: Secondary | ICD-10-CM | POA: Insufficient documentation

## 2013-12-31 DIAGNOSIS — J449 Chronic obstructive pulmonary disease, unspecified: Secondary | ICD-10-CM | POA: Insufficient documentation

## 2013-12-31 DIAGNOSIS — I2510 Atherosclerotic heart disease of native coronary artery without angina pectoris: Secondary | ICD-10-CM | POA: Insufficient documentation

## 2013-12-31 DIAGNOSIS — Z4931 Encounter for adequacy testing for hemodialysis: Secondary | ICD-10-CM

## 2013-12-31 DIAGNOSIS — D649 Anemia, unspecified: Secondary | ICD-10-CM | POA: Insufficient documentation

## 2013-12-31 DIAGNOSIS — F411 Generalized anxiety disorder: Secondary | ICD-10-CM | POA: Insufficient documentation

## 2013-12-31 DIAGNOSIS — Z7982 Long term (current) use of aspirin: Secondary | ICD-10-CM | POA: Insufficient documentation

## 2013-12-31 DIAGNOSIS — G4733 Obstructive sleep apnea (adult) (pediatric): Secondary | ICD-10-CM | POA: Insufficient documentation

## 2013-12-31 DIAGNOSIS — E669 Obesity, unspecified: Secondary | ICD-10-CM | POA: Insufficient documentation

## 2013-12-31 DIAGNOSIS — E109 Type 1 diabetes mellitus without complications: Secondary | ICD-10-CM | POA: Insufficient documentation

## 2013-12-31 DIAGNOSIS — J4489 Other specified chronic obstructive pulmonary disease: Secondary | ICD-10-CM | POA: Insufficient documentation

## 2013-12-31 HISTORY — DX: Unspecified cataract: H26.9

## 2013-12-31 HISTORY — DX: Irritable bowel syndrome, unspecified: K58.9

## 2013-12-31 HISTORY — PX: AV FISTULA PLACEMENT: SHX1204

## 2013-12-31 LAB — GLUCOSE, CAPILLARY: GLUCOSE-CAPILLARY: 181 mg/dL — AB (ref 70–99)

## 2013-12-31 LAB — POCT I-STAT 4, (NA,K, GLUC, HGB,HCT)
Glucose, Bld: 164 mg/dL — ABNORMAL HIGH (ref 70–99)
HCT: 36 % (ref 36.0–46.0)
HEMOGLOBIN: 12.2 g/dL (ref 12.0–15.0)
Potassium: 4 mEq/L (ref 3.7–5.3)
Sodium: 139 mEq/L (ref 137–147)

## 2013-12-31 SURGERY — ARTERIOVENOUS (AV) FISTULA CREATION
Anesthesia: General | Site: Arm Lower | Laterality: Left

## 2013-12-31 MED ORDER — HYDROCORTISONE NA SUCCINATE PF 1000 MG IJ SOLR
INTRAMUSCULAR | Status: DC | PRN
  Administered 2013-12-31: 100 mg via INTRAVENOUS

## 2013-12-31 MED ORDER — HEPARIN SODIUM (PORCINE) 1000 UNIT/ML IJ SOLN
INTRAMUSCULAR | Status: DC | PRN
  Administered 2013-12-31: 7000 [IU] via INTRAVENOUS

## 2013-12-31 MED ORDER — 0.9 % SODIUM CHLORIDE (POUR BTL) OPTIME
TOPICAL | Status: DC | PRN
  Administered 2013-12-31: 1000 mL

## 2013-12-31 MED ORDER — CHLORHEXIDINE GLUCONATE CLOTH 2 % EX PADS: 6.0000 | MEDICATED_PAD | Freq: Once | CUTANEOUS | Status: DC

## 2013-12-31 MED ORDER — ONDANSETRON HCL 4 MG/2ML IJ SOLN
INTRAMUSCULAR | Status: AC
  Filled 2013-12-31: qty 2

## 2013-12-31 MED ORDER — THROMBIN 20000 UNITS EX SOLR
CUTANEOUS | Status: AC
  Filled 2013-12-31: qty 20000

## 2013-12-31 MED ORDER — HEPARIN SODIUM (PORCINE) 1000 UNIT/ML IJ SOLN
INTRAMUSCULAR | Status: AC
  Filled 2013-12-31: qty 1

## 2013-12-31 MED ORDER — MIDAZOLAM HCL 2 MG/2ML IJ SOLN
INTRAMUSCULAR | Status: AC
  Filled 2013-12-31: qty 2

## 2013-12-31 MED ORDER — PROPOFOL 10 MG/ML IV BOLUS
INTRAVENOUS | Status: AC
  Filled 2013-12-31: qty 20

## 2013-12-31 MED ORDER — PROPOFOL 10 MG/ML IV BOLUS
INTRAVENOUS | Status: DC | PRN
  Administered 2013-12-31: 150 mg via INTRAVENOUS

## 2013-12-31 MED ORDER — ONDANSETRON HCL 4 MG/2ML IJ SOLN
4.0000 mg | Freq: Once | INTRAMUSCULAR | Status: AC | PRN
  Administered 2013-12-31: 4 mg via INTRAVENOUS

## 2013-12-31 MED ORDER — FENTANYL CITRATE 0.05 MG/ML IJ SOLN
INTRAMUSCULAR | Status: DC | PRN
  Administered 2013-12-31 (×2): 50 ug via INTRAVENOUS

## 2013-12-31 MED ORDER — SODIUM CHLORIDE 0.9 % IV SOLN: INTRAVENOUS | Status: DC

## 2013-12-31 MED ORDER — ONDANSETRON HCL 4 MG/2ML IJ SOLN
INTRAMUSCULAR | Status: DC | PRN
  Administered 2013-12-31: 4 mg via INTRAVENOUS

## 2013-12-31 MED ORDER — FENTANYL CITRATE 0.05 MG/ML IJ SOLN
INTRAMUSCULAR | Status: AC
  Filled 2013-12-31: qty 5

## 2013-12-31 MED ORDER — SODIUM CHLORIDE 0.9 % IV SOLN
INTRAVENOUS | Status: DC
  Administered 2013-12-31 (×2): via INTRAVENOUS

## 2013-12-31 MED ORDER — TRAMADOL HCL 50 MG PO TABS: 50.0000 mg | ORAL_TABLET | Freq: Three times a day (TID) | ORAL | Status: DC | PRN

## 2013-12-31 MED ORDER — LIDOCAINE HCL (PF) 1 % IJ SOLN
INTRAMUSCULAR | Status: AC
  Filled 2013-12-31: qty 30

## 2013-12-31 MED ORDER — SODIUM CHLORIDE 0.9 % IR SOLN
Status: DC | PRN
  Administered 2013-12-31: 10:00:00

## 2013-12-31 MED ORDER — LIDOCAINE HCL (CARDIAC) 20 MG/ML IV SOLN
INTRAVENOUS | Status: DC | PRN
  Administered 2013-12-31: 60 mg via INTRAVENOUS

## 2013-12-31 MED ORDER — FENTANYL CITRATE 0.05 MG/ML IJ SOLN: 25.0000 ug | INTRAMUSCULAR | Status: DC | PRN

## 2013-12-31 MED ORDER — MIDAZOLAM HCL 5 MG/5ML IJ SOLN
INTRAMUSCULAR | Status: DC | PRN
  Administered 2013-12-31: 1 mg via INTRAVENOUS

## 2013-12-31 SURGICAL SUPPLY — 37 items
ADH SKN CLS APL DERMABOND .7 (GAUZE/BANDAGES/DRESSINGS) ×1
ARMBAND PINK RESTRICT EXTREMIT (MISCELLANEOUS) ×2 IMPLANT
BLADE 10 SAFETY STRL DISP (BLADE) ×2 IMPLANT
CANISTER SUCTION 2500CC (MISCELLANEOUS) ×2 IMPLANT
CLIP TI MEDIUM 6 (CLIP) ×2 IMPLANT
CLIP TI WIDE RED SMALL 6 (CLIP) ×2 IMPLANT
COVER PROBE W GEL 5X96 (DRAPES) ×2 IMPLANT
COVER SURGICAL LIGHT HANDLE (MISCELLANEOUS) ×2 IMPLANT
DECANTER SPIKE VIAL GLASS SM (MISCELLANEOUS) ×2 IMPLANT
DERMABOND ADVANCED (GAUZE/BANDAGES/DRESSINGS) ×1
DERMABOND ADVANCED .7 DNX12 (GAUZE/BANDAGES/DRESSINGS) ×1 IMPLANT
DRAIN PENROSE 1/4X12 LTX STRL (WOUND CARE) ×2 IMPLANT
ELECT REM PT RETURN 9FT ADLT (ELECTROSURGICAL) ×2
ELECTRODE REM PT RTRN 9FT ADLT (ELECTROSURGICAL) ×1 IMPLANT
GEL ULTRASOUND 20GR AQUASONIC (MISCELLANEOUS) IMPLANT
GLOVE BIO SURGEON STRL SZ 6.5 (GLOVE) ×1 IMPLANT
GLOVE BIO SURGEON STRL SZ7.5 (GLOVE) ×1 IMPLANT
GLOVE BIOGEL PI IND STRL 6.5 (GLOVE) IMPLANT
GLOVE BIOGEL PI INDICATOR 6.5 (GLOVE) ×3
GLOVE SURG SS PI 7.5 STRL IVOR (GLOVE) ×1 IMPLANT
GOWN STRL REUS W/ TWL LRG LVL3 (GOWN DISPOSABLE) ×3 IMPLANT
GOWN STRL REUS W/TWL LRG LVL3 (GOWN DISPOSABLE) ×6
KIT BASIN OR (CUSTOM PROCEDURE TRAY) ×2 IMPLANT
KIT ROOM TURNOVER OR (KITS) ×2 IMPLANT
LOOP VESSEL MINI RED (MISCELLANEOUS) IMPLANT
NS IRRIG 1000ML POUR BTL (IV SOLUTION) ×2 IMPLANT
PACK CV ACCESS (CUSTOM PROCEDURE TRAY) ×2 IMPLANT
PAD ARMBOARD 7.5X6 YLW CONV (MISCELLANEOUS) ×4 IMPLANT
SPONGE SURGIFOAM ABS GEL 100 (HEMOSTASIS) IMPLANT
SUT PROLENE 7 0 BV 1 (SUTURE) ×2 IMPLANT
SUT VIC AB 3-0 SH 27 (SUTURE) ×2
SUT VIC AB 3-0 SH 27X BRD (SUTURE) ×1 IMPLANT
SUT VICRYL 4-0 PS2 18IN ABS (SUTURE) ×2 IMPLANT
TOWEL OR 17X24 6PK STRL BLUE (TOWEL DISPOSABLE) ×2 IMPLANT
TOWEL OR 17X26 10 PK STRL BLUE (TOWEL DISPOSABLE) ×2 IMPLANT
UNDERPAD 30X30 INCONTINENT (UNDERPADS AND DIAPERS) ×2 IMPLANT
WATER STERILE IRR 1000ML POUR (IV SOLUTION) ×2 IMPLANT

## 2013-12-31 NOTE — Telephone Encounter (Addendum)
Message copied by Doristine Section on Mon Dec 31, 2013  2:55 PM ------      Message from: Peter Minium K      Created: Mon Dec 31, 2013 11:27 AM      Regarding: Schedule                   ----- Message -----         From: Gabriel Earing, PA-C         Sent: 12/31/2013  11:01 AM           To: [facility] Charge Pool            S/p left radio-cephalic AVF 03/10/20.  Dr. Oneida Alar wants to see her back in 2 weeks with duplex.             Thanks      Aldona Bar ------  notified patient of post op appt. on 01-24-14 at 11 am for duplex and then 12:30 to see dr. Oneida Alar

## 2013-12-31 NOTE — H&P (View-Only) (Signed)
[facility] [place] HISTORY AND PHYSICAL   History of Present Illness:  Patient is a 68 y.o. year old female who presents for placement of a permanent hemodialysis access. The patient is right handed.  The patient is not currently on hemodialysis.  The cause of renal failure is thought to be secondary to diabetes .  Other chronic medical problems include coronary artery disease, asthma, COPD requiring home oxygen, sleep apnea. These are all currently controlled. She has had no prior access procedures..  Past Medical History  Diagnosis Date  . Unspecified essential hypertension   . CAD (coronary artery disease) 01/04/2006    Tiny OM1 70-90% ostial stenosis, no other CAD  . Asthma   . Esophageal reflux   . Obesity, unspecified   . Anxiety state, unspecified   . Hypercalcemia   . Anemia   . Other psoriasis   . Diverticulosis   . Sarcoidosis 2012  . Dyslipidemia   . COPD (chronic obstructive pulmonary disease)   . Gout   . Colon polyps     Tubular adenomatous polyps  . Spinal stenosis of lumbar region   . Bulging lumbar disc   . CHF (congestive heart failure)   . PNA (pneumonia) 2012    With pleural effusion, requiring thoracentesis  . Pneumonia     "several times" (04/02/2013)  . Pleural effusion 2012  . Chronic bronchitis   . Exertional shortness of breath     "and lying flat" (04/02/2013)  . On home oxygen therapy     "2L when I'm up; 3L when I'm at sleep" (04/02/2013)  . OSA (obstructive sleep apnea)     marginally compliant with CPAP  . Type II diabetes mellitus   . Degenerative joint disease   . Arthritis     'all over" (04/02/2013)  . Chronic lower back pain   . Kidney disease     baseline creatinie 2.0,  folowed by Dr Hassell Done  . Renal failure     "kidney's are working @ 50%" (04/02/2013)  . Diverticulosis   . GERD (gastroesophageal reflux disease)   . Anxiety   . Renal insufficiency   . Hyperlipidemia   . Sarcoidosis of lung     Past Surgical History   Procedure Laterality Date  . Vesicovaginal fistula closure w/  total abdominal hysterectomy  1992  . Bilateral total knee replacements Bilateral Rt=5/04 & Lft=1/09    by DrAlusio  . 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 Zella Richer  . Appendectomy  1957  . Tonsillectomy  1968  . Cholecystectomy  1980's  . Abdominal hysterectomy  1992  . Reduction mammaplasty Bilateral 1980's  . Cardiac catheterization  1990's  . Thoracentesis  2012  . Joint replacement       Social History History  Substance Use Topics  . Smoking status: Never Smoker   . Smokeless tobacco: Never Used  . Alcohol Use: No    Family History Family History  Problem Relation Age of Onset  . Diabetes Mother   . Heart disease Mother   . Hyperlipidemia Mother   . Varicose Veins Mother   . Breast cancer Maternal Aunt   . Heart disease Father   . Deep vein thrombosis Father   . Hyperlipidemia Father   . Heart disease Sister     before age 86  . Cancer Sister   . Diabetes Sister   . Hyperlipidemia Sister   . Heart attack Sister   . Heart disease Brother   .  Diabetes Brother   . Hyperlipidemia Brother   . Heart attack Brother   . Hyperlipidemia Sister     Allergies  Allergies  Allergen Reactions  . Adhesive [Tape] Other (See Comments)    Blisters   . Avelox [Moxifloxacin Hcl In Nacl]     GI upset  . Codeine Other (See Comments)    "crazy"   . Guaifenesin Nausea And Vomiting  . Latex Swelling  . Oxycodone Nausea And Vomiting     Current Outpatient Prescriptions  Medication Sig Dispense Refill  . ADVAIR DISKUS 250-50 MCG/DOSE AEPB inhale 1 dose by mouth twice a day  60 each  11  . albuterol (PROVENTIL HFA;VENTOLIN HFA) 108 (90 BASE) MCG/ACT inhaler Inhale 2 puffs into the lungs every 6 (six) hours as needed for wheezing or shortness of breath.      . Alpha-D-Galactosidase (BEANO PO) Take 1 tablet by mouth daily.      . Alum & Mag Hydroxide-Simeth (MAGIC MOUTHWASH) SOLN 1 tsp 4 times daily gargle  and swallow  120 mL  0  . Apremilast (OTEZLA) 30 MG TABS Take 1 tablet by mouth daily.      Marland Kitchen aspirin EC 81 MG tablet Take 81 mg by mouth daily.      Marland Kitchen atorvastatin (LIPITOR) 20 MG tablet Take 1 tablet (20 mg total) by mouth daily.  90 tablet  3  . esomeprazole (NEXIUM) 40 MG capsule Take 1 capsule (40 mg total) by mouth 2 (two) times daily.  60 capsule  3  . ferrous sulfate 325 (65 FE) MG tablet Take 325 mg by mouth daily with breakfast.      . fluticasone (FLONASE) 50 MCG/ACT nasal spray Place 2 sprays into the nose at bedtime.  48 g  3  . hydrALAZINE (APRESOLINE) 25 MG tablet Take 25 mg by mouth 3 (three) times daily.      . hydrOXYzine (ATARAX/VISTARIL) 25 MG tablet Take 1 tablet (25 mg total) by mouth every 4 (four) hours as needed.  100 tablet  5  . insulin detemir (LEVEMIR) 100 UNIT/ML injection Inject 50 Units into the skin 2 (two) times daily.       . insulin lispro (HUMALOG KWIKPEN) 100 UNIT/ML SOPN Take 30 units in the morning, 40 units in the afternoon and 40 units in the evening.      . isosorbide dinitrate (ISORDIL) 20 MG tablet Take 1 tablet (20 mg total) by mouth 3 (three) times daily.  270 tablet  1  . ketorolac (ACULAR LS) 0.4 % SOLN Place 1 drop into both eyes 3 (three) times daily.       . Lancets MISC 1 Units by Does not apply route 4 (four) times daily - after meals and at bedtime.      . Linaclotide (LINZESS) 290 MCG CAPS capsule Take 290 mcg by mouth every other day.      Marland Kitchen LORazepam (ATIVAN) 1 MG tablet Take 1/2 to 1 tablet by mouth three times daily as needed  50 tablet  5  . metoprolol tartrate (LOPRESSOR) 25 MG tablet Take 0.5 tablets (12.5 mg total) by mouth 2 (two) times daily.  90 tablet  3  . Multiple Vitamins-Minerals (MULTIVITAMIN WITH MINERALS) tablet Take 1 tablet by mouth daily.      . ondansetron (ZOFRAN) 8 MG tablet Take 1 tablet (8 mg total) by mouth every 8 (eight) hours as needed for nausea.  12 tablet  0  . predniSONE (DELTASONE) 5 MG tablet Take 5  mg by  mouth daily with breakfast.      . pregabalin (LYRICA) 100 MG capsule Take 1 capsule (100 mg total) by mouth every evening.  30 capsule  3  . PRESCRIPTION MEDICATION Patient takes a  Medication that she gets from her doctor as samples. Patient does not know the name of this medication. Patient has been on this medication for 3 weeks.  Patient uses for psoriasis      . torsemide (DEMADEX) 20 MG tablet Take 2 tablets (40 mg total) by mouth daily.  60 tablet  3  . traMADol (ULTRAM) 50 MG tablet Take one tablet by mouth three times daily as needed  50 tablet  1  . triamcinolone cream (KENALOG) 0.1 % Apply 1 application topically 2 (two) times daily.       No current facility-administered medications for this visit.    ROS:   General:  No weight loss, Fever, chills  HEENT: No recent headaches, no nasal bleeding, no visual changes, no sore throat  Neurologic: No dizziness, blackouts, seizures. No recent symptoms of stroke or mini- stroke. No recent episodes of slurred speech, or temporary blindness.  Cardiac: No recent episodes of chest pain/pressure, + shortness of breath at rest.  + shortness of breath with exertion.  Denies history of atrial fibrillation or irregular heartbeat  Vascular: No history of rest pain in feet.  No history of claudication.  No history of non-healing ulcer, No history of DVT   Pulmonary: + home oxygen, no productive cough, no hemoptysis,  + asthma or wheezing  Musculoskeletal:  [ ]  Arthritis, [ ]  Low back pain,  [ ]  Joint pain  Hematologic:No history of hypercoagulable state.  No history of easy bleeding.  No history of anemia  Gastrointestinal: No hematochezia or melena,  No gastroesophageal reflux, no trouble swallowing  Urinary: [ ]  chronic Kidney disease, [ ]  on HD - [ ]  MWF or [ ]  TTHS, [ ]  Burning with urination, [ ]  Frequent urination, [ ]  Difficulty urinating;   Skin: No rashes  Psychological: No history of anxiety,  No history of  depression   Physical Examination  Filed Vitals:   12/20/13 1553  BP: 183/64  Pulse: 81  Height: 5\' 2"  (1.575 m)  Weight: 214 lb (97.07 kg)  SpO2: 100%    Body mass index is 39.13 kg/(m^2).  General:  Alert and oriented, no acute distress HEENT: Normal Neck: No bruit or JVD Pulmonary: Clear to auscultation bilaterally Cardiac: Regular Rate and Rhythm without murmur Gastrointestinal: Soft, non-tender, non-distended, no mass, obese Skin: No rash Extremity Pulses:  2+ radial, brachial pulses bilaterally Musculoskeletal: No deformity or edema  Neurologic: Upper and lower extremity motor 5/5 and symmetric  DATA: The patient had a vein mapping ultrasound today which I reviewed and interpreted. The left cephalic vein is 3 mm in the forearm 4-5 mm in the upper arm in the right arm the vein is 2 mm in the forearm 4-5 mm in the upper arm left and right basilic vein was 3 mm.   ASSESSMENT:   Needs hemodialysis access   PLAN:  Patient seems adequate vein for a left radiocephalic fistula if at the time of exploration of the left wrist the vein is inadequate we would proceed with a left brachiocephalic fistula. Risks benefits possible complications and procedure details including but not limited to fistula non-maturation bleeding infection ischemic steal were explained the patient and her husband today. She understands and agrees to proceed. Procedure is scheduled  for 12/31/2013.  Ruta Hinds, MD [facility] Office: [phone] Pager: [phone]

## 2013-12-31 NOTE — Discharge Instructions (Signed)
° ° °  12/31/2013 [name] [id number] 01/09/1946  Surgeon(s): Elam Dutch, MD  Procedure(s): ARTERIOVENOUS (AV) FISTULA CREATION-left radio-cephalic AVF  x Do not stick graft for 12 weeks

## 2013-12-31 NOTE — Anesthesia Preprocedure Evaluation (Signed)
Anesthesia Evaluation  Patient identified by MRN, date of birth, ID band Patient awake    Reviewed: Allergy & Precautions, H&P , NPO status , Patient's Chart, lab work & pertinent test results, reviewed documented beta blocker date and time   Airway Mallampati: II TM Distance: >3 FB     Dental  (+) Partial Upper, Partial Lower, Dental Advisory Given   Pulmonary  breath sounds clear to auscultation        Cardiovascular hypertension, Rhythm:Regular Rate:Normal     Neuro/Psych    GI/Hepatic   Endo/Other  diabetes  Renal/GU      Musculoskeletal   Abdominal (+) + obese,   Peds  Hematology   Anesthesia Other Findings   Reproductive/Obstetrics                           Anesthesia Physical Anesthesia Plan  ASA: III  Anesthesia Plan: General   Post-op Pain Management:    Induction: Intravenous  Airway Management Planned: LMA  Additional Equipment:   Intra-op Plan:   Post-operative Plan: Extubation in OR  Informed Consent: I have reviewed the patients History and Physical, chart, labs and discussed the procedure including the risks, benefits and alternatives for the proposed anesthesia with the patient or authorized representative who has indicated his/her understanding and acceptance.   Dental advisory given  Plan Discussed with: CRNA and Anesthesiologist  Anesthesia Plan Comments:         Anesthesia Quick Evaluation

## 2013-12-31 NOTE — Transfer of Care (Signed)
Immediate Anesthesia Transfer of Care Note  Patient: [name]  Procedure(s) Performed: Procedure(s): ARTERIOVENOUS (AV) FISTULA CREATION (Left)  Patient Location: PACU  Anesthesia Type:General  Level of Consciousness: awake, alert  and oriented  Airway & Oxygen Therapy: Patient Spontanous Breathing and Patient connected to nasal cannula oxygen  Post-op Assessment: Report given to PACU RN and Post -op Vital signs reviewed and stable  Post vital signs: Reviewed and stable  Complications: No apparent anesthesia complications

## 2013-12-31 NOTE — Op Note (Signed)
Procedure: Left Radial Cephalic AV fistula  Preop: ESRD  Postop: ESRD  Anesthesia: General  Assistant: Leontine Locket, PA-c  Findings: 2.5 mm cephalic vein                 2.5 mm radial artery  Procedure Details:  The left upper extremity was prepped and draped in usual sterile fashion. A longitudinal skin incision was then made over the cephalic vein at the distal left forearm. The incision was carried into the subcutaneous tissues down to level cephalic vein. The vein had some spasm but was overall reasonable quality but small at 2.5 mm. The vein was dissected free circumferentially and small side branches ligated and divided between silk ties. The distal end was ligated. This was gently distended with heparinized saline, spatulated, and marked for orientation. Next the radial artery was dissected free in the medial portion incision. The artery was 2.5 mm in diameter but had a reasonable pulse. The vessel loops were placed proximal and distal to the planned site of arteriotomy. The patient was given 7000 units of intravenous heparin. After appropriate circulation time, the vessel loops were used to control the artery. A longitudinal opening was made in the left radial artery. The vein was controlled proximally with a fine bulldog clamp. The vein was then swung over to the artery and sewn end of vein to side of artery using a running 7-0 Prolene suture. Just prior to completion of the anastomosis it was forebled backbled and thoroughly flushed. The anastomosis was secured, vessel loops released, and there was good doppler flow in the fistula immediately.  After hemostasis was obtained, the subcutaneous tissues were reapproximated using a running 3-0 Vicryl suture. The skin was then closed with a 4 0 Vicryl subcuticular stitch. Dermabond was applied to the skin incision. The patient tolerated the procedure well and there were no complications. Instrument sponge and needle count were correct at the end of the  case. The patient was taken to PACU in stable condition.   Ruta Hinds, MD  [facility]  Office: [phone]  Pager: [phone]

## 2013-12-31 NOTE — Anesthesia Postprocedure Evaluation (Signed)
  Anesthesia Post-op Note  Patient: [name]  Procedure(s) Performed: Procedure(s): ARTERIOVENOUS (AV) FISTULA CREATION (Left)  Patient Location: PACU  Anesthesia Type:General  Level of Consciousness: awake, alert  and oriented  Airway and Oxygen Therapy: Patient Spontanous Breathing and Patient connected to nasal cannula oxygen  Post-op Pain: mild  Post-op Assessment: Post-op Vital signs reviewed, Patient's Cardiovascular Status Stable, Respiratory Function Stable, Patent Airway and Pain level controlled  Post-op Vital Signs: stable  Last Vitals:  Filed Vitals:   12/31/13 1310  BP: 149/63  Pulse: 74  Temp:   Resp:     Complications: No apparent anesthesia complications

## 2013-12-31 NOTE — Progress Notes (Signed)
Report received from [place]

## 2013-12-31 NOTE — Interval H&P Note (Signed)
History and Physical Interval Note:  12/31/2013 9:12 AM  [name]  has presented today for surgery, with the diagnosis of End stage renal disease   The various methods of treatment have been discussed with the patient and family. After consideration of risks, benefits and other options for treatment, the patient has consented to  Procedure(s): ARTERIOVENOUS (AV) FISTULA CREATION (Left) as a surgical intervention .  The patient's history has been reviewed, patient examined, no change in status, stable for surgery.  I have reviewed the patient's chart and labs.  Questions were answered to the patient's satisfaction.     Elam Dutch

## 2013-12-31 NOTE — Interval H&P Note (Signed)
History and Physical Interval Note:  12/31/2013 7:46 AM  [name]  has presented today for surgery, with the diagnosis of End stage renal disease   The various methods of treatment have been discussed with the patient and family. After consideration of risks, benefits and other options for treatment, the patient has consented to  Procedure(s): ARTERIOVENOUS (AV) FISTULA CREATION (Left) as a surgical intervention .  The patient's history has been reviewed, patient examined, no change in status, stable for surgery.  I have reviewed the patient's chart and labs.  Questions were answered to the patient's satisfaction.     [name]

## 2014-01-01 ENCOUNTER — Encounter (HOSPITAL_COMMUNITY): Payer: Self-pay | Admitting: Vascular Surgery

## 2014-01-01 ENCOUNTER — Encounter (HOSPITAL_COMMUNITY): Payer: Medicare Other

## 2014-01-01 DIAGNOSIS — J45909 Unspecified asthma, uncomplicated: Secondary | ICD-10-CM | POA: Insufficient documentation

## 2014-01-01 DIAGNOSIS — Z5189 Encounter for other specified aftercare: Secondary | ICD-10-CM | POA: Insufficient documentation

## 2014-01-03 ENCOUNTER — Encounter (HOSPITAL_COMMUNITY): Payer: Self-pay

## 2014-01-08 ENCOUNTER — Encounter (HOSPITAL_COMMUNITY)
Admission: RE | Admit: 2014-01-08 | Discharge: 2014-01-08 | Disposition: A | Discharge status: Home / Self Care | Payer: Self-pay | Source: Ambulatory Visit | Attending: Pulmonary Disease | Admitting: Pulmonary Disease

## 2014-01-10 ENCOUNTER — Encounter (HOSPITAL_COMMUNITY)
Admission: RE | Admit: 2014-01-10 | Discharge: 2014-01-10 | Disposition: A | Discharge status: Home / Self Care | Payer: Self-pay | Source: Ambulatory Visit | Attending: Pulmonary Disease | Admitting: Pulmonary Disease

## 2014-01-15 ENCOUNTER — Encounter (HOSPITAL_COMMUNITY)
Admission: RE | Admit: 2014-01-15 | Discharge: 2014-01-15 | Disposition: A | Discharge status: Home / Self Care | Payer: Self-pay | Source: Ambulatory Visit | Attending: Pulmonary Disease | Admitting: Pulmonary Disease

## 2014-01-15 LAB — GLUCOSE, CAPILLARY: GLUCOSE-CAPILLARY: 109 mg/dL — AB (ref 70–99)

## 2014-01-17 ENCOUNTER — Encounter (HOSPITAL_COMMUNITY): Payer: Self-pay

## 2014-01-22 ENCOUNTER — Encounter: Payer: Self-pay | Admitting: Pulmonary Disease

## 2014-01-22 ENCOUNTER — Encounter (HOSPITAL_COMMUNITY)
Admission: RE | Admit: 2014-01-22 | Discharge: 2014-01-22 | Disposition: A | Discharge status: Home / Self Care | Payer: Self-pay | Source: Ambulatory Visit | Attending: Pulmonary Disease | Admitting: Pulmonary Disease

## 2014-01-23 ENCOUNTER — Encounter: Payer: Self-pay | Admitting: Vascular Surgery

## 2014-01-24 ENCOUNTER — Encounter: Payer: Self-pay | Admitting: Vascular Surgery

## 2014-01-24 ENCOUNTER — Encounter (HOSPITAL_COMMUNITY): Payer: Self-pay

## 2014-01-24 ENCOUNTER — Ambulatory Visit (HOSPITAL_COMMUNITY)
Admission: RE | Admit: 2014-01-24 | Discharge: 2014-01-24 | Disposition: A | Discharge status: Home / Self Care | Payer: Medicare Other | Source: Ambulatory Visit | Attending: Vascular Surgery | Admitting: Vascular Surgery

## 2014-01-24 ENCOUNTER — Ambulatory Visit (INDEPENDENT_AMBULATORY_CARE_PROVIDER_SITE_OTHER): Payer: Self-pay | Admitting: Vascular Surgery

## 2014-01-24 VITALS — BP 172/90 | HR 74 | Resp 20 | Ht 62.0 in | Wt 216.1 lb

## 2014-01-24 DIAGNOSIS — N186 End stage renal disease: Secondary | ICD-10-CM

## 2014-01-24 DIAGNOSIS — Z4931 Encounter for adequacy testing for hemodialysis: Secondary | ICD-10-CM

## 2014-01-24 NOTE — Progress Notes (Signed)
Patient is a 68 year old female returns for followup today. She had a left radiocephalic AV fistula placed on June 1. The vein was fairly small. The artery was also small. The patient is currently not on dialysis.  Physical exam:  Filed Vitals:   01/24/14 1226  BP: 172/90  Pulse: 74  Resp: 20  Height: 5\' 2"  (1.575 m)  Weight: 216 lb 1.6 oz (98.022 kg)    Left radiocephalic incision is healing well. There is an audible bruit in the fistula there is no palpable thrill the fistula is small Neuro: Sensation and motor intact left hand  Data: Duplex ultrasound AV fistula was performed today. I reviewed and interpreted this study. Overall the fistula was small in its diameter 1-3 mm.  Assessment: Non-maturing AV fistula left arm. I discussed with the patient that most likely she will need a left upper arm fistula due to the small size of her vein and artery. At this time she wishes to give the fistula a few more weeks to see if she can cause it to mature somewhat exercise. She will followup in 6 weeks' time with a repeat duplex ultrasound. If the fistula has not achieved 6 mm diameter status at that time we will consider left upper arm AV fistula.  Ruta Hinds, MD [facility] Office: [phone] Pager: [phone]

## 2014-01-25 ENCOUNTER — Ambulatory Visit (HOSPITAL_BASED_OUTPATIENT_CLINIC_OR_DEPARTMENT_OTHER): Payer: Medicare Other | Attending: Pulmonary Disease | Admitting: Radiology

## 2014-01-25 VITALS — Ht 62.0 in | Wt 212.0 lb

## 2014-01-25 DIAGNOSIS — Z79899 Other long term (current) drug therapy: Secondary | ICD-10-CM | POA: Insufficient documentation

## 2014-01-25 DIAGNOSIS — I10 Essential (primary) hypertension: Secondary | ICD-10-CM | POA: Insufficient documentation

## 2014-01-25 DIAGNOSIS — D869 Sarcoidosis, unspecified: Secondary | ICD-10-CM | POA: Insufficient documentation

## 2014-01-25 DIAGNOSIS — J4489 Other specified chronic obstructive pulmonary disease: Secondary | ICD-10-CM | POA: Insufficient documentation

## 2014-01-25 DIAGNOSIS — J449 Chronic obstructive pulmonary disease, unspecified: Secondary | ICD-10-CM | POA: Insufficient documentation

## 2014-01-25 DIAGNOSIS — G4733 Obstructive sleep apnea (adult) (pediatric): Secondary | ICD-10-CM

## 2014-01-25 DIAGNOSIS — Z794 Long term (current) use of insulin: Secondary | ICD-10-CM | POA: Insufficient documentation

## 2014-01-25 DIAGNOSIS — Z7982 Long term (current) use of aspirin: Secondary | ICD-10-CM | POA: Insufficient documentation

## 2014-01-29 ENCOUNTER — Encounter (HOSPITAL_COMMUNITY)
Admission: RE | Admit: 2014-01-29 | Discharge: 2014-01-29 | Disposition: A | Discharge status: Home / Self Care | Payer: Self-pay | Source: Ambulatory Visit | Attending: Pulmonary Disease | Admitting: Pulmonary Disease

## 2014-01-30 ENCOUNTER — Telehealth: Payer: Self-pay | Admitting: Pulmonary Disease

## 2014-01-30 DIAGNOSIS — G4733 Obstructive sleep apnea (adult) (pediatric): Secondary | ICD-10-CM

## 2014-01-30 NOTE — Telephone Encounter (Signed)
PSG 01/25/14 >> AHI 16.4, SaO2 low 79%.  CPAP 13 cm H2O >> AHI 0, + R, +S.  Central's with higher pressures.  Will have my nurse inform pt that sleep study showed moderate sleep apnea.  She did better with CPAP in second half of sleep study.  Options at this time are 1) arrange for CPAP set up now and ROV in 2 months, or 2) have ROV first.  If pt is agreeable to CPAP set up, then send order for CPAP 13 cm H2O with heated humidity and mask of choice.  Have download sent 2 weeks after starting CPAP and arrange for ROV 2 months after starting CPAP.

## 2014-01-30 NOTE — Sleep Study (Signed)
[place]  NAME: [place] OF BIRTH:  13-Sep-1945 MEDICAL RECORD NUMBER [id number]  LOCATION: [facility]  PHYSICIAN: Chesley Mires, M.D. DATE OF STUDY: 01/25/2014  SLEEP STUDY TYPE: Split night protocol               REFERRING PHYSICIAN: Chesley Mires, MD  INDICATION FOR STUDY:  [name] is a 68 y.o. female who presents to the sleep lab for evaluation of hypersomnia with obstructive sleep apnea.  She reports snoring, sleep disruption, apnea, and daytime sleepiness.  She was diagnosed with sleep apnea in November 2007 with RDI 21, and SaO2 low 62%.  She has history of sarcoidosis, hypertension, COPD, and diastolic heart failure.  EPWORTH SLEEPINESS SCORE: 16. HEIGHT: 5\' 2"  (157.5 cm)  WEIGHT: 212 lb (96.163 kg)    Body mass index is 38.77 kg/(m^2).  NECK SIZE: 16 in.  MEDICATIONS:  Current Outpatient Prescriptions on File Prior to Visit  Medication Sig Dispense Refill  . albuterol (PROVENTIL HFA;VENTOLIN HFA) 108 (90 BASE) MCG/ACT inhaler Inhale 2 puffs into the lungs every 6 (six) hours as needed for wheezing or shortness of breath.      . Alpha-D-Galactosidase (BEANO PO) Take 1 tablet by mouth daily.      . Alum & Mag Hydroxide-Simeth (MAGIC MOUTHWASH) SOLN Take 5 mLs by mouth 4 (four) times daily as needed for mouth pain.      Marland Kitchen Apremilast (OTEZLA) 30 MG TABS Take 30 mg by mouth daily.       Marland Kitchen aspirin EC 81 MG tablet Take 81 mg by mouth daily.      Marland Kitchen atorvastatin (LIPITOR) 20 MG tablet Take 1 tablet (20 mg total) by mouth daily.  90 tablet  3  . DOXYCYCLINE HYCLATE PO Take by mouth 2 (two) times daily.      Marland Kitchen esomeprazole (NEXIUM) 40 MG capsule Take 1 capsule (40 mg total) by mouth 2 (two) times daily.  60 capsule  3  . ferrous sulfate 325 (65 FE) MG tablet Take 325 mg by mouth daily with breakfast.      . fluticasone (FLONASE) 50 MCG/ACT nasal spray Place 2 sprays into the nose at bedtime.  48 g  3  .  Fluticasone-Salmeterol (ADVAIR) 250-50 MCG/DOSE AEPB Inhale 1 puff into the lungs 2 (two) times daily.      . hydrALAZINE (APRESOLINE) 25 MG tablet Take 25 mg by mouth 3 (three) times daily.      . hydrOXYzine (ATARAX/VISTARIL) 25 MG tablet Take 25 mg by mouth every 4 (four) hours as needed for itching.      . insulin detemir (LEVEMIR) 100 UNIT/ML injection Inject 50 Units into the skin 2 (two) times daily.       . insulin lispro (HUMALOG KWIKPEN) 100 UNIT/ML SOPN Take 30 units in the morning, 40 units in the afternoon and 40 units in the evening.      . isosorbide dinitrate (ISORDIL) 20 MG tablet Take 1 tablet (20 mg total) by mouth 3 (three) times daily.  270 tablet  1  . ketorolac (ACULAR LS) 0.4 % SOLN Place 1 drop into both eyes 3 (three) times daily.       . Lancets MISC 1 Units by Does not apply route 4 (four) times daily - after meals and at bedtime.      . Linaclotide (LINZESS) 290 MCG CAPS capsule Take 290 mcg by mouth every other day.      Marland Kitchen  LORazepam (ATIVAN) 1 MG tablet Take 0.5-1 mg by mouth 3 (three) times daily as needed for anxiety.      . metoprolol tartrate (LOPRESSOR) 25 MG tablet Take 0.5 tablets (12.5 mg total) by mouth 2 (two) times daily.  90 tablet  3  . Multiple Vitamins-Minerals (MULTIVITAMIN WITH MINERALS) tablet Take 1 tablet by mouth daily.      . ondansetron (ZOFRAN) 8 MG tablet Take 1 tablet (8 mg total) by mouth every 8 (eight) hours as needed for nausea.  12 tablet  0  . predniSONE (DELTASONE) 5 MG tablet Take 5 mg by mouth daily with breakfast.      . pregabalin (LYRICA) 100 MG capsule Take 1 capsule (100 mg total) by mouth every evening.  30 capsule  3  . torsemide (DEMADEX) 20 MG tablet Take 2 tablets (40 mg total) by mouth daily.  60 tablet  3  . traMADol (ULTRAM) 50 MG tablet Take 1 tablet (50 mg total) by mouth 3 (three) times daily as needed for moderate pain or severe pain.  30 tablet  0  . triamcinolone cream (KENALOG) 0.1 % Apply 1 application topically 2  (two) times daily.       No current facility-administered medications on file prior to visit.    SLEEP ARCHITECTURE:  Diagnostic portion: Total recording time: 217 minutes.  Total sleep time was: 168.5 minutes.  Sleep efficiency: 77.6%.  Sleep latency: 18 minutes.  REM latency: 193 minutes.  Stage N1: 6%.  Stage N2: 92.9%.  Stage N3: 0%.  Stage R:  3.6%.  Supine sleep: 168.5 minutes.  Non-supine sleep: 0 minutes.  Titration portion: Total recording time: 179 minutes.  Total sleep time was: 175 minutes.  Sleep efficiency: 97.8%.  Sleep latency: 0 minutes.  REM latency: 0 minutes.  Stage N1: 1.1%.  Stage N2: 69.4%.  Stage N3: 0%.  Stage R:  29.4%.  Supine sleep: 175 minutes.  Non-supine sleep: 123.5 minutes.  CARDIAC DATA:  Average heart rate: 61 beats per minute. Rhythm strip: sinus rhythm with PVC's and PAC's.  RESPIRATORY DATA: Diagnostic portion: Average respiratory rate: 16. Snoring: moderate. Average AHI: 16.4.   Apnea index: 6.8.  Hypopnea index: 9.6. Obstructive apnea index: 5.3.  Central apnea index: 1.1.  Mixed apnea index: 0.4. REM AHI: 110.  NREM AHI: 12.9. Supine AHI: 16.4. Non-supine AHI: N/A.  Titration portion: She was started on CPAP 5 and increased to 17 cm H2O.  With CPAP at 13 cm H2O her AHI was 0.  She was observed in REM and supine sleep.  She had more frequent central events with higher pressure settings.  MOVEMENT/PARASOMNIA:  Diagnostic portion: Periodic limb movement: 0.  Period limb movements with arousals: 0.  Titration portion: Periodic limb movement: 0.  Period limb movements with arousals: 0.  Restroom trips: one.  OXYGEN DATA:  Baseline oxygenation: 98%. Lowest SaO2: 79%. Time spent below SaO2 90%: 2.6 minutes. Supplemental oxygen used: none.  IMPRESSION/ RECOMMENDATION:   This study shows moderate obstructive sleep apnea with an AHI of 16.4, and SaO2 low of 79%.    Optimal CPAP setting appeared to be 13 cm H2O.  She developed  frequent central events with higher pressure settings.  She was fitted with a ResMed small size Airfit F10 full face mask.   Chesley Mires, M.D. Diplomate, [employer] of [employer] Medicine  ELECTRONICALLY SIGNED ON:  01/30/2014, 11:21 AM [place] PH: [phone]   FX: [phone] [place]  [employer] OF SLEEP [employer]ICINE

## 2014-01-31 ENCOUNTER — Encounter (HOSPITAL_COMMUNITY): Payer: Self-pay

## 2014-01-31 DIAGNOSIS — Z5189 Encounter for other specified aftercare: Secondary | ICD-10-CM | POA: Insufficient documentation

## 2014-01-31 DIAGNOSIS — J45909 Unspecified asthma, uncomplicated: Secondary | ICD-10-CM | POA: Insufficient documentation

## 2014-02-05 ENCOUNTER — Encounter (HOSPITAL_COMMUNITY)
Admission: RE | Admit: 2014-02-05 | Discharge: 2014-02-05 | Disposition: A | Discharge status: Home / Self Care | Payer: Self-pay | Source: Ambulatory Visit | Attending: Pulmonary Disease | Admitting: Pulmonary Disease

## 2014-02-05 NOTE — Telephone Encounter (Signed)
LMOM x 1 

## 2014-02-06 ENCOUNTER — Telehealth: Payer: Self-pay | Admitting: *Deleted

## 2014-02-06 NOTE — Telephone Encounter (Signed)
2 month recall was entered into the EPIC system by Mindy per previous telephone message. Nothing further needed.

## 2014-02-06 NOTE — Telephone Encounter (Signed)
Pt returned call

## 2014-02-06 NOTE — Telephone Encounter (Signed)
I spoke with patient about results and she verbalized understanding and had no questions She wanted to go ahead and be set up on CPAP-order placed 2 month recall reminder placed in epic for appt. Nothing further needed

## 2014-02-06 NOTE — Telephone Encounter (Signed)
Message copied by Virl Cagey on Wed Feb 06, 2014 12:45 PM ------      Message from: Phillips Grout J      Created: Wed Feb 06, 2014 11:54 AM      Regarding: return ov appointment in 2 months       Per referral placed on 02/06/14 and phone note, pt will need to have an appointment in 2 months for f/u cpap with Dr. Halford Chessman. Can you please schedule this for patient?      Thanks,       Suanne Marker ------

## 2014-02-07 ENCOUNTER — Encounter (HOSPITAL_COMMUNITY)
Admission: RE | Admit: 2014-02-07 | Discharge: 2014-02-07 | Disposition: A | Discharge status: Home / Self Care | Payer: Self-pay | Source: Ambulatory Visit | Attending: Pulmonary Disease | Admitting: Pulmonary Disease

## 2014-02-12 ENCOUNTER — Encounter (HOSPITAL_COMMUNITY): Payer: Self-pay

## 2014-02-14 ENCOUNTER — Encounter (HOSPITAL_COMMUNITY): Payer: Self-pay

## 2014-02-15 ENCOUNTER — Telehealth: Payer: Self-pay | Admitting: Pulmonary Disease

## 2014-02-15 NOTE — Telephone Encounter (Signed)
Spoke with Arbie Cookey-- aware of recs per Dr Halford Chessman  Order to be processed per Arbie Cookey and patient to be notified. Nothing further needed.

## 2014-02-15 NOTE — Telephone Encounter (Signed)
Called and spoke to [facility]. They stated that they received the order for the CPAP but was inquiring if the pt needed to be on O2 at night with the CPAP d/t SN putting her on 2L with exertion and 1L at night.   VS please advise if to continue with original order or to add O2 with CPAP. Thanks.   Phone note 01/30/2014: Chesley Mires, MD at 01/30/2014 11:32 AM     Status: Signed        PSG 01/25/14 >> AHI 16.4, SaO2 low 79%. CPAP 13 cm H2O >> AHI 0, + R, +S. Central's with higher pressures.  Will have my nurse inform pt that sleep study showed moderate sleep apnea. She did better with CPAP in second half of sleep study.  Options at this time are 1) arrange for CPAP set up now and ROV in 2 months, or 2) have ROV first.  If pt is agreeable to CPAP set up, then send order for CPAP 13 cm H2O with heated humidity and mask of choice. Have download sent 2 weeks after starting CPAP and arrange for ROV 2 months after starting CPAP.

## 2014-02-15 NOTE — Telephone Encounter (Signed)
She did not need oxygen with CPAP during study.  She does not need oxygen with CPAP at home.  Please ensure Huey Romans completes order as prescribed.

## 2014-02-18 ENCOUNTER — Encounter: Payer: Self-pay | Admitting: Pulmonary Disease

## 2014-02-18 ENCOUNTER — Ambulatory Visit (INDEPENDENT_AMBULATORY_CARE_PROVIDER_SITE_OTHER): Payer: Medicare Other | Admitting: Pulmonary Disease

## 2014-02-18 VITALS — BP 134/60 | HR 82 | Temp 97.6°F | Ht 62.0 in | Wt 221.0 lb

## 2014-02-18 DIAGNOSIS — D869 Sarcoidosis, unspecified: Secondary | ICD-10-CM

## 2014-02-18 DIAGNOSIS — I5032 Chronic diastolic (congestive) heart failure: Secondary | ICD-10-CM

## 2014-02-18 DIAGNOSIS — J45909 Unspecified asthma, uncomplicated: Secondary | ICD-10-CM

## 2014-02-18 DIAGNOSIS — N259 Disorder resulting from impaired renal tubular function, unspecified: Secondary | ICD-10-CM

## 2014-02-18 DIAGNOSIS — E1129 Type 2 diabetes mellitus with other diabetic kidney complication: Secondary | ICD-10-CM

## 2014-02-18 DIAGNOSIS — J984 Other disorders of lung: Secondary | ICD-10-CM

## 2014-02-18 DIAGNOSIS — G4733 Obstructive sleep apnea (adult) (pediatric): Secondary | ICD-10-CM

## 2014-02-18 MED ORDER — METOCLOPRAMIDE HCL 10 MG PO TABS: 10.0000 mg | ORAL_TABLET | Freq: Every day | ORAL | Status: DC

## 2014-02-18 MED ORDER — HYDROCOD POLST-CHLORPHEN POLST 10-8 MG/5ML PO LQCR: 5.0000 mL | Freq: Two times a day (BID) | ORAL | Status: DC | PRN

## 2014-02-18 MED ORDER — TRAMADOL HCL 50 MG PO TABS: 50.0000 mg | ORAL_TABLET | Freq: Three times a day (TID) | ORAL | Status: DC | PRN

## 2014-02-18 MED ORDER — MAGIC MOUTHWASH: 5.0000 mL | Freq: Four times a day (QID) | ORAL | Status: DC | PRN

## 2014-02-18 NOTE — Patient Instructions (Signed)
Today we updated your med list in our EPIC system...    Continue your current medications the same...  For your cough>>  We need to maximize your chronic anti-reflux regimen...    Continue NEXIUM twice daily taken ~30 min before 1st & last meals of the day...    Do not eat or drink after dinner in the eve...    elev the head of your bed (craftmatic bed adjustment)  Take the new REGLAN (Metaclopramide) 10mg  one tab about an hour before bedtime w/ sip of water...  Continue the Magic mouthwash as needed for throat symptoms...  We refilled your Tussionex to use as needed for cough...   Call for any questions...  Let's plan a follow up visit in 67mo, sooner if needed for problems.Marland KitchenMarland Kitchen

## 2014-02-18 NOTE — Progress Notes (Signed)
Subjective:    Patient ID: [name], female    DOB: Nov 08, 1945, 68 y.o.   MRN: [id number]  HPI 68 y/o BF here for a follow up visit... he has multiple medical problems as noted below...    ~  April 28, 2012:  10moROV & last visit we decreased the Pred to 568md (on this for hypercalcemia from Sarcoid) & decreased the Lasix to 2059m (due to RI w/ Cr=2.2);  Follow up labs today showed Ca=9.1 and we decided to decr the Pred further to 5mg32md;  In addition her BUN=40, Creat=2.1, BNP=196 (ok to continue Lasix20); she understands that if her Creat starts to rise further that we will have to send her to Nephrology...    She saw DrAltheimer for f/u 9/13> note reviewed & he adjusted her insulin regimen- Levemir 80uQam & Apidra 20-34-30 + SS adjustments...    She fell 8/30 & hit her head> went to ER & DrLockwood's note is reviewed- c/o headache & left knee pain; Exam showed forehead hematoma & CTBrain showed left frontal scalp hematoma, no fx, mild cortical atrophy, mild carotid atherosclerosis, NAD;  Maxillofacial CT was neg w/o facial bone fx;  Left knee prosthesis & no acute changes...    She reports that she just had an ESI injection from DrRamos for LBP & she is improved...    [name], meds, xrays and labs> see below for updates >>  LABS 9/13:  Chems- BS=234, Ca=9.1, BUN=40, Creat=2.1, BNP=196   ~  June 21, 2012:  61mo 10mo& [name] went to the ER 06/14/12 w/ c/o SOB> she had developed a sore throat, wheezing, cough, & SOB; she had taken ZPak, Delsym, Proair which helped somewhat;  CXR showed cardiomeg, pulm vasc congestion, DJD in TSpine, NAD;  ER treated w/ Pred boost to 40mg/17m NEB Rx which really helped;  She notes stuff head & some drainage, denies GI/ reflux symptoms;  She is obese, cushingoid, and we were in the process of weaning off the Pred when it was boosted up by ER- we gave her a NEB Rx & this really helped;  Placed on O2 by nasal cannula for hypoxemia- 84%  on RA;  Decision made to wean Pred down to 5mgQod26m before, continue Advair250Bid, add NEBS Qid w/ Albut vs Proair, plus her Mucinex/ Fluids/ etc...    She continues her regular f/u appts w/ DrAltheimer, Endocrine> on Levemir, Apidra, Amaryl, Januvia; last A1c=8.1 is Sep2013...    [name], meds, xrays and labs> see below for updates >>   ~  October 25, 2012:  32mo ROV39moost hospital visit> she was H[facility]> presented w/ weakness, difficulty ambulating, sl confusion w/ memory loss; she had been to DrAltheimer's office & labs showed Ca>14 w/ Creat>4;  Hypercalcemia related to Sarcoidosis- treated w/ IVF, Lasix, Calcitonin, & Solumedrol- ACE level=80;  She was disch on Pred20Bid but we will need to wean this quickly due to her DM;  She had acute renal failure as well w/ Creat>4 and after hydration she was disch w/ Creat ~2;  DM followed & treated by DrAltheimer;  DCSummary reviewed in detail>>    Sarcoidosis w/ HYPERCALCEMIA> Hypercalcemia returned when Pred was cut to 5mg Qod;38mw back on Pred20Bid post hosp & Ca=10.6; we discussed wean to 20mg/d w/26m & recheck Ca level in 3 weeks...     HBP> on Metop25- 1/2, Norvasc10, Apres10Tid, Clonidine0.1Bid, Lasix20, K10-2/d; BP= 160/72 &  she denies CP, palpit, dizzy, ch in SOB/ DOE, edema, etc...    Cardiac Arrhythmia> she has WCT & SBrady- eval by DrAllred for Cards & meds adjusted...    CHOL> on Lip20; last FLP was 4/12- looked good & we reviewed low fat diet restrictions...    DM on Insulin> followed by DrA on [place] coverage at meals; BS at home varies 150-400+, labs done Q35moby DrAltheimer...    Obesity> wt= 203# (63"Tall & BMI=37) and we reviewed diet, exercise, & wt reduction strategies...    GI> GERD on NexiumBid; and we reviewed antireflux regimen (elev HOB, npo after dinner, etc)...    Renal Insuffic> Creat has returned to 2.08 now; advised incr free water intake...    Anxiety> on Alprazolam prn; she's been  under extra stress w/ loss of job, this illness, family issues... [name], meds, xrays and labs> see below for updates >>   ~  November 08, 2012:  2week ROV & she has been stable on Pred215md; still tired, notes DOE w/ walking; BMet today shows K=5.1, BUN=51, Cr=2.1, Ca=9.4; we discussed decr Pred to 1054m, incr free water intake, continue Lasix20; ROV 50mo80mo   Meds reviewed> HBP controlled on her 5med63mChol is ok on Lip20; DM regulated by DrAltheimer on LevimKanabecis unfortunately up to 205# & we reviewed diet/ exercise/ etc; she is coping well w/ her serious medical problems...  ~  Dec 19, 2012:  6wk ROV & recheck after her last visit w/ stable numbers on Pred20mg/6me decided to decr to 10mg/d40mollow up at this time to recheck her Calcium etc; unfortunately she made a mistake & has continued the Pred at the 20mg/d 69m since her last visit... Feeling OK overall, her wt is up 3# to 207# today, BP= 138/72, and she recently had f/u DrAltheimer to recheck her DM (insulin adjusted)...  We reviewed her medication record & her procedures and instructed her to decr the Pred to 10mg/d a4mis point... We wil  Recheck pt in 6-8weeks w/ BMet at that time...  ~  February 19, 2013:  78mo ROV &578mon last seen [name] cut her Pred to 10mg/d> sh78mes DrAltheimer frequently w/ extensive lab work done at each visit; last labs avail to us 6/14 shoKoread Calcium=9.6, BUN=36, Cr=1.86, A1c=8.5; We decided to continue Pred taper & she is instructed to decr the Pred to 05-06-09-5 Qod w/ ROV planned 80mo...  She66mo Home O2 for her Sarcoid, chronic hypoxemic resp failure, DiastolicCHF> we did Ambulatory O2 check today>  O2sats on RA:  Rest= 94% w/ pulse72;  After 3laps= 82% w/ pulse=90...     OSA on CPAP but only using 1-2d/wk    BP= 122/80 on her cardiac meds> Metop, Amlod, Hydral, Clonid, Lasix40; she denies CP, palpit, dizzy/ syncope, SOB, edema...    DM treated by DrA w/ Levemir 78uAM-0uPM, Apidra  18-30-26, Glimep4, Januv50; he [id number] to check her every month w/ extensive labs & his notes reviewed...    Despite diet etc her weight is up to 214# & we reviewed wt reduction strategy...    Renal insuffic w/ Cr ~2 chronically [name], meds, xrays and labs> see below for updates >>   ~  April 16, 2013:  78mo ROV & po44moosp check> Maymuna was Adm by [facility] 9/1 - 04/10/13 w/ acute on chronic diastolic CHF when she presented w/ increased SOB & edema; BNP was 2600, CXR  showed vasc congestion, 2DEcho w/ EF=60-65% & Gr2DD, Creat= 2.5 but increased to 4.1 w/ diuresis; that forced them to replace some fluids, she was disch on Demadex40...   Since disch she has been back to see DrAltheimer 9/12 w/ labs showing BS=255, A1c=8.1, BUN=73, Cr=2.94 and she has an upcoming appts w/ Nephrology & the CHF clinic soon...  Of note the Hospitalist team held her Pred during the hosp & didn't restart after disch- recall hx Sarcoidosis w/ primarily severe hypercalcemia as manifestation, prev splenectomy w/ transient improvement then recurrent hypercalcemia & Rx w/ Pred w/ nice response but calcium bumped up to >14 when Pred weaned to 38m Qod in early 2014; we have slowly diminished her dose back to 10-5 Qod when last seen & review of labs showed Calcium levels all wnl in hosp and measured 10.1 in DrAltheimer's office 9/11... We discussed options here & we decided to RESTART PRED 10-5 QOD for the next few weeks (she is feeling weak since off the Pred) then taper to 537md til return o\in [id number] we will check BMet & ACE level at that time...  ~  June 19, 2013:  67m71moV & Shannon continues to struggle w/ her multisystem disease>> from the Pulm standpoint her breathing is sl better & she is using Pred5mg74m Advair250Bid, ProairHFA as needed; her calcium level remains wnl as we have weaned the Pred (Ca=9.5 on labs yest); we discussed coming down 1/2 step to 5mg 68mernate w/ 2.5mg Q41m& we will recheck her in  67mo...37mo CARDS>>  On Metoprolol25-1/2Bid, Bidil20-37.5Tid, Demadex20-2/d;  She had f/u w/ DrMcLean 10/14> 45/40>hr diastolicCHF; sl improved since hosp 9/14 where Echo showed EF60-65%, Gr2DD, mildAS, RV normal; now on Demadex20-2Qam but labs yest showed BUN=113, Cr=4.2 (weight was up recently & they increased her to 40mgBid44m several days, now asked to hold Demadex for 2d & resume 40mg Qam18mer that w/ f/u labs); she needs f/u w/ Nephrology- DrSanford...     DM>>  Endocrine is managed by DrAltheimer on Levemir 50uBid and Apidra=> switched to Humalog (taking 18-30-26) but both are too expensive in the donut hole & she cannot afford her meds; asked to discuss w/ drAlt the poss of switch to NPH or poss 70/30 insulin to save money etc...     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     RENAL>>  She saw DrSanford 9/14> I have reviewed his perspicacious note & discussed w/ him; we have been able to wean her Pred down to 5mg/d and27mr hypercalcemia is remaining under control (there is no data on treatment for this manifestation w/ alternative meds); her CKD is most likely due to her DM & HBP, also has MGUS & the sarcoid (only clinical manifestation has been the hypercalcemia & lymphoreticular granulomatosis); NOTE: Cr prev 2 range, then 2-3 range & now 3-4 range since Cards increased diuretics for her diastolicCHF (hopefully Cr will improve somewhat as she's been on incr diuretics, water restriction, & had the gastroenteritis)...     PSORIASIS>> she sees DrTafeen for Derm; asking for refill Clobetasol cream & Atarax25mg prn i47mng [name], meds, xrays and labs> see below for updates >>   LABS 11/14 showed BS=279, BUN=113, Cr=4.2... Cards haMarland KitchenMarland Kitchenresponded w/ transient decr in diuretic dose, she needs  f/u w/  Renal...  ADDENDUM>>  Full PFTs done 08/08/13 prior to getting her enrolled into PulmRehab program>>  FVC=1.50 (65%), FEV1=1.23 (69%), %1sec=82, no improvement in FEV1 post bronchdilator, Lung volumes are mild to mod restricted, DLCO is reduced but normalizes after correction for VA...  ~  August 20, 2012:  318moROV & Lamara tells me she had a URI over Christmas that lasted 3 wks, she kept the Pred at 558md stating that she felt worse when she cut it to 5-2.5 Qod ("I felt weak")...     She is awaiting a call from [facility] get enrolled...    She needs a substitute for Bidil (20-37.5Tid)- too $$- and we discussed dividing it up into Isordil2056md and Hydralazine 40m76m...    She continues to see DrAltheimer for Endocrine, DM & his extensive notes are reviewed; A1c improved into the 8's...     She saw DrPerry w/ constipation- treated w/ Linzess but too $$, given samples and she will f/u w/ him; she also notes that Nexium definitely works better for her...    She is seeing DrSanford for Nephrology & he is favoring peritoneal dialysis but she isn't so sure; has f/u soon to recheck her renal function soon...    Her prev rash is resolved off Allopurinol... [name], meds, xrays and labs> see below for updates >> she had the 2014 Flu vaccine in Nov...  ~  November 19, 2013:  18mo 65mo& Yalena has been enrolled in PulmRLiscoually tells me insurance wouldn't cover PulmRCrosbye is paying for the CardsRehab maintenance program out of pocket... She continues regular follow up visits w/ DrSanford for Nephrology & DrAltheimer for Endocrine (they do labs with each visit which we review)... She had a gastroenteritis 3/15 w/ trip to the ER> Labs noted below- treated w/ IVF & Zofran, Immodium & cleared back to baseline...     OSA>> she indicates still not resting well, intol to CPAP & she agrees to f/u w/ sleep med, DrSooOkeene  PULM>> Hx Sarcoid w/ Hypercalcemia and restrictive lung dis by  PFTs 1/15> her breathing is stable on O2 at 2L/min, Pred5mg/d7mdvair250Bid, ProairHFA as needed; her calcium level remains wnl as we have weaned the Pred down to 5mg/d 49m we've been reluctant to cut further due to her prev hx w/ hyperclcemia flairs when Pred dropped lower than this- last Ca level was 10.3 in the ER 3/15 (gastroenteritis) & CXR was clear; we decided to continue the Pred5mg/d..53m  CARDS>>  On ASA81, Metoprolol25-1/2Bid, Isordil20Tid, Apres25Tid, Demadex20-2/d;  BP= 118/74 & she denies CP but has DOE w/ activity; saw DrMcLean 10/14> C81/15>r diastolicCHF; sl improved since hosp 9/14 where Echo showed EF60-65%, Gr2DD, mildAS, RV normal; on Demadex20-2Qam & renal is stable...    DM>>  Endocrine is managed by DrAltheimer on Levemir 56uBid and Humalog (taking 24-34-30); Labs 1/15 showed BS=165, A1c=8.4...    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     RENAL>>  She saw DrSanford 9/14 & 1/15> Stage4 CKD & nephrotic range proteinuria; I have reviewed his perspicacious note & discussed w/ him; we have been able to wean her Pred down to 5mg/d an32mer hypercalcemia is remaining under control (there is no data on treatment for this manifestation w/  alternative meds); her CKD is most likely due to her DM & HBP, also has MGUS & the sarcoid (only clinical manifestation has been the hypercalcemia & lymphoreticular granulomatosis); NOTE: Cr prev 2 range, then 2-3 range & now 3-4 range since Cards increased diuretics for her diastolicCHF; they discussed dialysis & she is opting for peritoneal dialysis when this is needed, they also discussed poss AVfistula for vasc access if needed later...    PSORIASIS>> she sees DrTafeen for Derm & has been started on some new medication... [name], meds, xrays and labs>  see below for updates >>   CXR 3/15 showed norm heart size, clear lungs, NAD...  LABS 3/15:  Chems- K=3.4, BUN=61, Cr=2.9, Ca=10.3, BS=211;  CBC- ok w/ Hg=14.8, WBC=14.8.Marland KitchenMarland Kitchen  ~  February 18, 2014:  20moROV & Ayrianna tells me that she has [facility]/ [facility]Antony [facility]bird[facility]... She tells me that she recently treated Cheyeanne for "a COPD exacerbation" w/ Depo & Pred bump=> pt states improved & back down to her usual Pred542md for her Sarcoid & hx hypercalcemia which remains well controlled, unfortunately she has gained 10+lbs and working hard to get this back down ... In the interim she has had f/u w/ DrSanford for Nephrology=> sent to DrFields vasc surg for access w/ left arm fistula attempted... She continues to f/u w/ DrAltheimer every 2-93m66mo adjust in DM meds, labs etc...     OSA> she had Sleep Med f/u DrSood, new machine & mask interface; she reports MUCH improved, resting better, feeling better, etc...    Hx sarcoid w/ hypercalcemia, and restrictive lung dis from this and obesity> on O2- 2L/min w/ activity, Advair250Bid, Pred5mg71m MMW, Flonase, Proair; she is c/o cough, esp at night,  & some reflux symptoms; she has known GERD followed by DrPerry, on Nexium40Bid & antireflux regimen; we reviewed the need for vigorous antireflux regimen- Nexium, NPO after dinner, elev HOB (she has craftmatic bed), and add REGLAN10mg60m also wrote for Tussionex 1tsp Q12h for prn use...  [name], meds, xrays and labs> see below for updates >> NOTE: she is on Apremilast per DrTafeen for Psoriasis...  LABS 7/15 by DrAltheimer> Chems- BUN=56, Cr=2.8, Ca=9.4, BS=94, A1c=8.5... REMarland KitchenMarland Kitchento continue O2 w/ exercise (she is in PulmRehab maintenance (but too $$ she says), Advair, Pred, etc... Establish vigorous antireflux regimen as above + Metaclopramide 10mg 6m.. ROV recheck in 3-20mo.  47mo   Problem List:    OBSTRUCTIVE SLEEP APNEA - Sleep Study 11/07 showed RDI=21 w/ desat  to 62% during REM... evalMarland Kitchenby DrSood Lightstreetfor CPAP 12... compliance poor- only using it 1-2 times/wk... encouraged to use CPAP more regularly & to f/u w/ DrSood regarding the mask interface problems... ~  8/11:  She reports new mask but still not using CPAP regularly, "I rest fairly well". ~  7/12:  She reports not using her CPAP, and not resting well... ~  1/13:  [facility], but still not compliant w/ home use... ~  7/14:  OSA on CPAP but only using 1-2d/wk... we did Ambulatory O2 check today>  O2sats on RA:  Rest= 94% w/ pulse72;  After 3laps= 82% w/ pulse=90...  ~  11/14: encouraged to use the CPAP more regularly... ~  4/15: she indicates that she cannot use the CPAP, intol to mask interface, and we have rec f/u sleep Med consult w/ DrSood to explore  her options...  BRONCHITIS, ACUTE - on ADVAIR 250Bid (using Prn only) & PROAIR as needed. 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 ~  CXR 1/09 was pre-op for TKR- sl cardomeg & ?mild vasc congestion. ~  CXR 2/11 showed norm heart size & vascularity, clear, s/p cholecystectomy, DJD in TSpine. ~  CXR & CT Chest 11/11 showed sl peribronch thickening, no lung lesions, no signif adenopathy. ~  PET scan 1/12 showed hypermetabolic activ in spleen & lymph nodes in the supraclav, hilar, mediastinal, periaotic & iliac region as well. ~  CXRs 3/12 in the perioperative period after splenectomy showed left effusion==> tapped & improved aeration. ~  CXR 4/12 back to baseline w/ clear bases, essent wnl... ~  CXR 7/12 showed norm heart size, clear lungs, no definite adenopathy, NAD... ~  LABS 7/12 w/ ACE=78 (8-52), Ca=12.0, AlkPhos=176 (39-117), GGT=151 (7-51) ~  Labs from DrAltheimer> 8/12: Ca=12.4, Creat=1.8; then 9/12: Ca=13.4, Creat=2.3;  ~  10/12:  PREDNISONE 57m/d started 10/12 & 3wks later Ca=10.3, Creat=1.8; therefore weaned to 20-10 Qod. ~  11/12: Labs on Pred 20-10  Qod showed Ca++= 9.8; we will continue to wean Pred. ~  1/13:  CXR showed cardiomeg, mild interstitial edema, DJD spine; Labs showed Ca=9.7 & we decided to wean Pred to 5134md... ~  3/13:  Labs showed Ca=10.1 on Pred 34m67m; decided to wean to 34mg58m... ~  6/13:  Labs showed Ca=11.0, ACE=86; on Pred5Qod & rec to incr back to 10mg40m. ~  8/13:  CXR showed cardiomeg, ?mild interstitial edema, mild DJD TSpine... ~  Labs 8/13 showed Ca=10.0 on Pred10/d; rec to decr further to 34mg/d60m ~  Labs 9/13 showed Ca= 9.1 on Pred34mg/d 70mhe will try to decr to 5Qod again... ~  11/13: she recently went to ER w/ incr SOB & was placed on Pred40/d; we are adding NEBS w/ ALBUT2.34mgQid 43mean Pred back to 34mgQod a72mefore. ~  3/14: she was hosp w/ Ca>14 (on the Pred34mgQod); 30mated w/ IVF, Lasix, Calcitonin & incr steroids; Disch on Pred20Bid; we will wean Pred to 20mg/d & f63mn 3wk. ~  4/14:  On Pred20/d & calcium improved to 9.4; we will decr to Pred10mg/d & f/655mo... ~  7/101mo On Pred10mg/d & calc47mremains well controlled at 9.6 and we decided to decr Pred to 05-06-09-5 Qod... ~  9/14:  Hosp for acute on chr diastolic CHF & [facility] stopped her Pred; Calcium levels in hosp were 9.6-10.1 and recent labs by DrAltheimer showed Ca=10.1; REC- restart Pred 10-5 QOD & try to wean to 34mg/d w/ f/u l63m in 55mo... ~  CXR 916moshowed mild cardiomeg (stable), mild vasc congestion, sl bronch wall thickening & mild elev right hemidiaph (no changes)... ~  11/14: Labs shows Ca=9.5 on Pred34mg daily; ACE=431mRec to decr further to 34mg alt w/ 2.34mg 31m=> she was 63mble to do this & kept the Pred at 34mg/d... ~  1/15: P18m done prior to enrolling into PulmRehab program>> FVC=1.50 (65%), FEV1=1.23 (69%), %1sec=82, no improvement in FEV1 post bronchdilator, Lung volumes are mild to mod restricted, DLCO is reduced but normalizes after correction for VA... ~  4/15: Hx Sarcoid w/ Hypercalcemia and restrictive lung dis by PFTs 1/15> her breathing  is stable on O2 at 2L/min, Pred34mg/d, Advair250Bid,634moairHFA as needed; her calcium level remains wnl as we have weaned the Pred down to 34mg/d but we've been 64muctant to cut further due to her prev hx w/ hyperclcemia flairs  when Pred dropped lower than this- last Ca level was 10.3 in the ER 3/15 (gastroenteritis) & CXR was clear; we decided to continue the Pred75m/d... ~  7/15:  Labs from DrAltheimer showed Ca= 9.4 on her Pred 532md, continue same; c/o cough, nocturnal exac & reflux symptoms- rec to optimize antireflux regimen (NexiumBid, NPO after dinner, elev HOB) and we will add Reglan1081ms...   SHE HAS ESTABLISHED PRIMARY CARE w/ [facility]. CamAnder Gasterlp>>   HYPERTENSION >> on numerous meds w/ med changes over time >> she is asked to bring all med bottles to the visits but never does! ~  1/13: [place]= 146/70 on ?4me5m Metop12.5, Amlod10, Clonid0.1Bid, Lasix20; she denies CP, palpit, SOB, edema> improved from recent hosp... ~  3/13: She called in the interim for "refill" Apresoline not prev on any med list; BP= 150/80 on all 5 meds; must elim sodium & get wt down... ~  6/13:  BP= 142/80 on 5med35mut BUN-41 Creat=2.3 BNP=104; rec to stop Lasix for now, f/u BMet 6wks. ~  8/13:  BP= 140/70 on same meds & BUN=30, Creat=1.6, but BNP incr to 2960 & LASIX 40mg/88mstarted... ~  9/13:  Follow up labs showed BUN=45, Creat=2.2 on Lasix40 and we rec decr to Lasix20... ~  9/13:  BP= 122/74 on 5meds 41mBUN=40, Creat=2.1, & BNP=196... Continue same. ~  11/13:  BP= 142/68 on same 5meds..22m  3/14:  BP= 160/72 on same 5 meds for now... ~  4/14:  BP= 128/70 on her 5med reg51mn...  ~  7/14:  BP= 122/80 on her 5 cardiac meds> Metop, Amlod, Hydral, Clonid, Lasix40; she denies CP, palpit, dizzy/ syncope, SOB, edema... ~  9/14:  She was hosp w/ acute on chronic diastolic CHF & meds changed; disch on Metoprolol25-1/2, Bidil 20-37.5Tid, Demadex20-2/d; BP= 128/70 but recent labs by DrA showed BUN=73, Cr=2.94 &  she has f/u w/ Neph & CHF clinic... ~  11/14: on same meds & BP= 138/64...  1/15 BP+ 134/60... ~  4/15: on ASA81, Metoprolol25-1/2Bid, Isordil20Tid, Apres25Tid, Demadex20-2/d;  BP= 118/74 & she denies CP but has DOE w/ activity...  CAD, CHRONIC DIASTOLIC CHF,  & CARDIAC ARRHYTHMIA >> on ASA 81mg/d...34m Hx non-obstructive CAD w/ cath 6/07 by DrStuckey showing luminal irregularities & tiny first marginal branch of the CIRC w/ ostial lesion... good LVF. ~  2DEcho 9/07 showed mildly calcif AoV and normal LVF & wall motion. ~  2DEcho 11/11 showed norm LV wall thickness, norm LVF w/ EF= 60-65%, norm atria, norm valves, trivial peric fluid behind heart. ~  2DEcho 1/13 showed mild LVH, norm LVF w/ EF=55-60% & no regional wall motion abn, Gr2DD, norm RV... ~  3/14: In hosp for hypercalcemia etc> she had cardiac arrhythmia w/ WCT & SBrady- to be f/u DrAllred... ~  5/14: she had f/u DrAllred> hosp f/u for WCT (atrial tachy per EP), pt asymptomatic- denies CP, palpit, SOB, dizzy/ syncope, edema, etc; no further w/u planned, f/u prn... ~  7/14: she had f/u DrWall> chr diastolicCHF, nonobstructiveCAD, mildMR- stable, no changes made, f/u 70yr... ~  39yr:  She was adm by [facility] w/ acute on chr diastolicCHF (BNP=2600, CPPI=9518sc congestion, 2DEcho w/ EF60-65% & Gr2DD); meds adjusted & diuresed but Creat incr to 4.1; eventually disch on Demadex40 & Cr= 3.3=>2.9; she has appt in CHF clinic... ~  11/14: she has been actively followed by CHF clinic & DrMcLean w/ med adjustments...  CEREBROVASCULAR DISEASE - she remains on ASA 81mg/d46m  without TIA's or other neuro manifestations...  ~  MRA Br 9/07 showed mod intracranial atherosclerotic changes...   HYPERLIPIDEMIA - on LIPITOR 46m/d... FLP monitored at each OFloydby DrAltheimer... ~  FNewaygo7/07 showed TChol 114, TG 85, HDL 28, LDL 69 ~  FLP 4/09 showed TChol 154, TG 192, HDL 34, LDL 82... discussed poss of adding fibrate- hold... ~  FLP 2/10 on Lip20 showed TChol 159, TG  207, HDL 29, LDL 83... rec> add Fenoglide (she didn't). ~  FTrail2/11 on Lip20 showed TChol 185, TG 242, HDL 43, LDL 99... add FENOFIBRATE 16448md... ~  FL[place]let[place]/11 on Lip20+Feno160 showed TChol 167, TG 216, HDL 37, LDL 96... she stopped Feno160. ~  FLTiff[place]/12 on Lip20 showed TChol 146, TG 115, HDL 35, LDL 88 ~  FLP 2/13 on Lip20 showed TChol 159, TG 176, HDL 41, LDL 86 ~  LABS checked by DrAltheimer  DM - on LEVEMIR & APIDRA SS coverage now per DrAltheimer; plus prev on GLIMEPIRIDE 48m248m and JANUVIA 15m52m ~  labs 4/08 showed BS=138 & HgA1c=7.5... hMarland Kitchenme BS = 100 to >300... misses doses and not on diet or exercising... ~  1/09 became hypoglycemic in hosp on 4 meds post knee surg- meds adjusted... ~  3/09 restarted GLIMEPIRIDE 48mg-2m2 tab each AM, & continue Lantus & Metformin... ~  labs 4/09 showed BS= 148, HgA1c= 7.3... reMarland Kitchen> back on 4 med regimen due to poor control at home. ~  6/09 discussed titrating the Lantus up until FBS 100-120 range... ~  labs 2/10 showed BS= 164, HgA1c= 8.6... veMarland KitchenMarland Kitchen disappointing- needs home BS monitoring, incr Lantus. ~  labs 2/11 showed BS= 298, A1c= 8.4...  discussed change Lantus to LEVEMIR 40 u daily... ~  labs 8/11 showed BS= 202, A1c= 10.9... reMarland Kitchen> incr Levemir to 50, consider Humalog. ~  Nov-Dec 2011:  BS up w/ adjust of meds & addition of Pred after hosp 11/11 & hypercalcemia... ~  She states she returned to Levemir 40u daily after the splenectomy hosp due to appetite etc;  4/12 BS=117 ~  Labs 7/12 showed BS= 167, A1c= 8.4> on Levemir 50u/d & Amaryl 48mg Q6m(note creat 1.9). ~  8-10/12: on Levemir30 + ApidraSS per DrA & BS are 150-400+ due to the Pred20; A1c 8/12 was 7.6 ~  11/12:  DrA has increased her Levemir to 46u/d & the Apidra to 10+SS cover Tid... BS here= 168 ~  1/13: Covered w/ SSI in Hosp bAddis1c=9.3 (Creat up to 2.4 but improved to 1.7 w/ adjust meds)... ~  DrAltheimer's notes indicates continued adjustments in her insulin regimen w/ Levemir & Apidra... ~   9/14:  He prev oral meds were stopped & currently taking Levemir40Bid & Apidra incr to 24-34-30 by DrA; recent BS=255, A1c=8.1 ~  11/14:  meds adjusted, on Levemir 50uBid and Apidra=> switched to Humalog (taking 18-30-26) but both are too expensive in the donut hole & she cannot afford her meds; asked to discuss options w/ DrAltheimer. ~  1/15: she had f/u DrAltheimer> his note is reviewed, BS=165, A1c=8.4 ~  2/15: note from Ophthalmology- DrRankin> +DM retinopathy & macular edema...  OBESITY (ICD-278.00) - weight down to 171 after hosp 11/11- doing better on diet, not yet exercising. ~  weight 220-230# in the early 1990's... ~  weight 210-220# in the early 2000's... ~  weight 2/10 = 192# ~  weight 2/11 = 186# ~  weight 8/11 = 174# ~  weight 12/11 = 171# ~  Weight 4/12 =  168# ~  Weight 7/12 = 171# ~  Weight 11/12 = 188# ~  Weight 1/13 = 196# ~  Weight 3/13 = 205#... She MUST get back on diet, incr exercise, get wt down. ~  Weight 6/13 = 205# ~  Weight 8/13 = 210# ~  Weight 9/13 = 220#... What happened? ~  Weight 11/13 = 225# and BMI= 40 ~  Weight 3/14 = 203# ~  Weight 7/14 = 214# ~  Weight 9/14 = 216# ~  Weight 11/14 = 224# and we reviewed diet, exercise & wt reduction strategies... ~  Weight 4/15= 208#  HYPERCALCEMIA> this was most likely due to sarcoidosis w/ extensive splenic involvement;  Calcium was as hight as 13.7 and returned to normal after the splenectomy. ~  Labs 4/12 showed calcium = 11.1 & this is very disappointing... ~  Labs 7/12 showed Ca= 12.0, Phos= 4.0, ACE= 78 ~  Labs 8-10/12 showed Ca up to 13.4, then down to 10.3 on PRED69m/d... ~  Labs 11/12 showed Ca= 9.8 on Pred 20-10 qod; we will continue to wean. ~  Labs 1/13 in [facility] Ca= 9.7 range on Pred 10-5 Qod;  We decided to wean Pred to 548md... ~  Labs 3/13 showed Ca=10.1 on Pred5m37m; rec to decr to 5mg58m... ~  Labs 6/13 showed Ca=11.0 on Pred5Qod; rec incr to 10mg50m. ~  Labs 8/13 showed Ca=10.0 on  Pred10/d; rec to decr to 5mg/d43m ~  Labs 9/13 showed Ca=9.1 on Pred5mg/d;20mc to decr to 5mgQod.60m~  Labs 12/13 showed Ca= 9.2 to 10.5 on Pred 5mgQod..82m  Labs 3/14 in hosp showed Ca= 14.5 & disch on Pred20Bid; 3/14 f/u Ca=10.6 & Pred decr to 20mg/d...76m4/14: on Pred20/d and f/u labs showed Ca= 9.4... Rec to decr Pred to 10mg/d & f58mmo... ~  791mo on Pred10/d and most recent Ca= 9.6... rec to deMarland KitchenMarland Kitchen Pred to 05-06-09-5 Qod... ~  9/14: Pred was stopped by [facility] duringh 9/14 hosp & serial calcium levels reviewed- 9.6=>10.1; we decided to restart her Pred10-5 QOD for now & try to wean to 5mg/d by f/u27m 26mo w/ repeat23mos... ~  11/14: on Pred5mg/d and Ca=964m rec to decr to 5mg alternate w39m.5mg QOD... ~  3/63m labs from ER showed Ca= 10.3 on Pred5mg/d...   GERD -51m NEXIUM 40mg Bid... w/ inc60med reflux symptoms w/ noct cough etc...  ~  2/10: discussed optimal Rx w/ Nex before dinner, Zantac300 + Reglan10 at bed, elev HOB, etc. ~  2/11: she stopped the Reglan, still using Nexium/ Zantac but w/ persist symptoms>  refer to GI for eval. ~  6/11: GI f/u DrPerry w/ EGD that was normal... continue Rx. ~  12/11:  increased GI symptoms post hosp> she will f/u w/ DrPerry for his input> incr Nexium Bid. 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 ~  6/11:  f/u colonoscopy by DrPerry showed divertics, 4 polyps, AVM... path= tubular adeMarland KitchenMarland Kitchenma, f/u 12yrs. ~  9/14: she d78yribes abd cramps, sick feeling when she has to go- better after BM, takes Senakot-S and Bentyl10Qid prn...  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 ~  Note: Serum Calcium ret to normal immediately after spleen removed, but hypercalcemia  returned after several months forcing  [place] to start Rx w/ Pred...  RENAL INSUFFICIENCY (ICD-588.9) >> Creat ~ 2.0 & eval 10/11 by DrFox- prob due to DM & HBP... ~  Labs 4/12 showed BUN= 27, Creat= 1.6 ~  Labs 7/12 showed BUN= 44, Creat= 1.9 (not on diuretics/ NSAIDs/ etc)... ~  Labs showed Creat up to 2.3 when Ca=13.4; now improved to 1.8 w/ Ca coming down... ~  Labs 11/12 showed BUN=38, Creat=1.7; rec to maintain hydration... ~  Labs 1/13 in [facility] w/ diuresis showed Creat incr to 2.4 but ret to 1.7 w/ adjustment in meds... ~  Labs 3/13 showed BUN=38, Creat= 1.6 ~  Labs 6/13 showed BUN=41, Creat= 2.3; rec> stop Lasix20 for now, liberalize fluids. ~  Labs 9/13 showed BUN=40, Creat=2.1 on Lasix20, rec to continue the same. ~  Labs 12/13 showed Creat= 1.5 ~  Labs 3/14 in hosp showed Creat=4, then decr to 2.00 by dischage... ~  Labs 4/14 showed BUN=51, Creat=2.1, and rec to incr free water as we wean the Pred... 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 ~  11/14: Labs are worse w/ BUN=113, Creat=4.2 after recent gastroent 7 diuretics increased by Cards for wt gain; they have adjusted down & she needs Renal follow up... ~  Labs 3/15 ER showed BUN=61, Cr=2.9; followed by DrSanford & DrAltheimer...  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 on LYRICA 136m Qhs, off prev Tramadol Rx... ~  2010 eval by DrHiatt @ [facility] Tramadol 577m Lyrica 7544md...  GOUT, UNSPECIFIED (ICD-274.9) - Uric in the 7-8 range...on ALLOPURINOL 300m64mfor prevention...  ANXIETY (ICD-300.00) - she is under mod stress- work, mother died, hospice counselling, etc... ~  8/11:  rec starting Alprazolam 0.5mg 49m for palpit, SOB, anxiety...  ANEMIA-NOS & MGUS >> see eval 10-11/11 by DrGranfortuna  w/ bone marrow 11/11 in hosp= neg. ~  Labs 4/12 showed Hg= 11.6, MCV= 84... On FeSO4 daily. ~  Labs 7/12 showed Hg= 12.8, MCV= 90, Fe= 45 (14%sat)... ~  Labs 1/13 in [facility] Hg=10.4 but improved to 12.9 on post hosp check in office... ~  Labs 6/13 showed Hg= 12.4 ~  Labs 12/13 showed Hg= 13.9 ~  Labs 3/14 showed Hg= 12.9 ~  Labs 9/14 during hosp showed Hg= 10-11 range... ~  Labs 3/15 showed Hg= 14.8  PSORIASIS - eval and Rx per DrTafeen prev on Humira injections- stopped 9/11...   Past Surgical History  Procedure Laterality Date  . Vesicovaginal fistula closure w/  total abdominal hysterectomy  1992  . Bilateral total knee replacements Bilateral Rt=5/04 & Lft=1/09    by DrAlusio  . 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 RosenZella Richerppendectomy  1957  . Tonsillectomy  1968  . Cholecystectomy  1980's  . Abdominal hysterectomy  1992  . Reduction mammaplasty Bilateral 1980's  . Cardiac catheterization  1990's  . Thoracentesis  2012  . Joint replacement    . Av fistula placement Left 12/31/2013    Procedure: ARTERIOVENOUS (AV) FISTULA CREATION;  Surgeon: CharlElam Dutch  [facility];  Laterality: Left;    Outpatient Encounter Prescriptions as of 02/18/2014  Medication Sig  . albuterol (PROVENTIL HFA;VENTOLIN HFA) 108 (90 BASE) MCG/ACT inhaler Inhale  2 puffs into the lungs every 6 (six) hours as needed for wheezing or shortness of breath.  . Alpha-D-Galactosidase (BEANO PO) Take 1 tablet by mouth daily.  . Alum & Mag Hydroxide-Simeth (MAGIC MOUTHWASH) SOLN Take 5 mLs by mouth 4 (four) times daily as needed for mouth pain.  Marland Kitchen Apremilast (OTEZLA) 30 MG TABS Take 30 mg by mouth daily.   Marland Kitchen aspirin EC 81 MG tablet Take 81 mg by mouth daily.  Marland Kitchen atorvastatin (LIPITOR) 20 MG tablet Take 1 tablet (20 mg total) by mouth daily.  Marland Kitchen DOXYCYCLINE HYCLATE PO Take by mouth 2 (two) times daily.  Marland Kitchen esomeprazole (NEXIUM) 40 MG capsule Take 1 capsule (40 mg total) by mouth  2 (two) times daily.  . ferrous sulfate 325 (65 FE) MG tablet Take 325 mg by mouth daily with breakfast.  . fluticasone (FLONASE) 50 MCG/ACT nasal spray Place 2 sprays into the nose at bedtime.  . Fluticasone-Salmeterol (ADVAIR) 250-50 MCG/DOSE AEPB Inhale 1 puff into the lungs 2 (two) times daily.  . hydrALAZINE (APRESOLINE) 25 MG tablet Take 25 mg by mouth 3 (three) times daily.  . hydrOXYzine (ATARAX/VISTARIL) 25 MG tablet Take 25 mg by mouth every 4 (four) hours as needed for itching.  . insulin detemir (LEVEMIR) 100 UNIT/ML injection Inject 50 Units into the skin 2 (two) times daily.   . insulin lispro (HUMALOG KWIKPEN) 100 UNIT/ML SOPN Take 30 units in the morning, 40 units in the afternoon and 40 units in the evening.  . isosorbide dinitrate (ISORDIL) 20 MG tablet Take 1 tablet (20 mg total) by mouth 3 (three) times daily.  Marland Kitchen ketorolac (ACULAR LS) 0.4 % SOLN Place 1 drop into both eyes 3 (three) times daily.   . Lancets MISC 1 Units by Does not apply route 4 (four) times daily - after meals and at bedtime.  . Linaclotide (LINZESS) 290 MCG CAPS capsule Take 290 mcg by mouth every other day.  Marland Kitchen LORazepam (ATIVAN) 1 MG tablet Take 0.5-1 mg by mouth 3 (three) times daily as needed for anxiety.  . metoprolol tartrate (LOPRESSOR) 25 MG tablet Take 0.5 tablets (12.5 mg total) by mouth 2 (two) times daily.  . Multiple Vitamins-Minerals (MULTIVITAMIN WITH MINERALS) tablet Take 1 tablet by mouth daily.  . ondansetron (ZOFRAN) 8 MG tablet Take 1 tablet (8 mg total) by mouth every 8 (eight) hours as needed for nausea.  . predniSONE (DELTASONE) 5 MG tablet Take 5 mg by mouth daily with breakfast.  . pregabalin (LYRICA) 100 MG capsule Take 1 capsule (100 mg total) by mouth every evening.  . torsemide (DEMADEX) 20 MG tablet Take 2 tablets (40 mg total) by mouth daily.  . traMADol (ULTRAM) 50 MG tablet Take 1 tablet (50 mg total) by mouth 3 (three) times daily as needed for moderate pain or severe pain.   Marland Kitchen triamcinolone cream (KENALOG) 0.1 % Apply 1 application topically 2 (two) times daily.  . [DISCONTINUED] Alum & Mag Hydroxide-Simeth (MAGIC MOUTHWASH) SOLN Take 5 mLs by mouth 4 (four) times daily as needed for mouth pain.  . [DISCONTINUED] traMADol (ULTRAM) 50 MG tablet Take 1 tablet (50 mg total) by mouth 3 (three) times daily as needed for moderate pain or severe pain.  . chlorpheniramine-HYDROcodone (TUSSIONEX PENNKINETIC ER) 10-8 MG/5ML LQCR Take 5 mLs by mouth every 12 (twelve) hours as needed for cough.  . metoCLOPramide (REGLAN) 10 MG tablet Take 1 tablet (10 mg total) by mouth at bedtime.    Allergies  Allergen Reactions  .  Adhesive [Tape] Other (See Comments)    Blisters   . Avelox [Moxifloxacin Hcl In Nacl]     GI upset  . Codeine Other (See Comments)    "crazy"   . Guaifenesin Nausea And Vomiting  . Latex Swelling  . Oxycodone Nausea And Vomiting    Current Medications, Allergies, Past Medical History, Past Surgical History, Family History, and Social History were reviewed in [employer] record.   Review of Systems         See HPI - all other systems neg except as noted... The patient complains of dyspnea on exertion.  The patient denies anorexia, fever, weight loss, weight gain, vision loss, decreased hearing, hoarseness, chest pain, syncope, peripheral edema, prolonged cough, headaches, hemoptysis, abdominal pain, melena, hematochezia, severe indigestion/heartburn, hematuria, incontinence, muscle weakness, suspicious skin lesions, transient blindness, difficulty walking, depression, unusual weight change, abnormal bleeding, enlarged lymph nodes, and angioedema.     Objective:   Physical Exam      WD, Overweight, 68 y/o BF in NAD...  GENERAL:  Alert & oriented; pleasant & cooperative... HEENT:  [place]/AT, EOM-wnl, PERRLA, EACs-clear, TMs-wnl, NOSE-clear, THROAT- clear & wnl... NECK:  Supple w/ fair ROM; no JVD; normal carotid impulses w/o  bruits; palp thyroid, w/o nodules felt; no lymphadenopathy. CHEST:  Clear to P & A; without wheezes/ rales/ or rhonchi. HEART:  Regular Rhythm; gr 1/6 SEM, without rubs/ or gallops. ABDOMEN:  Scar from splenectomy surg; obese, soft, & nontender w/ panniculus; normal bowel sounds; no organomegaly or masses detected EXT: without deformities, mod arthritic changes, s/p bilat TKRs; no varicose veins/ +venous insuffic/ tr edema. NEURO:  CN's intact; no focal neuro deficits... DERM:  Psoriasis rash per DrTafeen...  RADIOLOGY DATA:  Reviewed in the EPIC EMR & discussed w/ the patient...  LABORATORY DATA:  Reviewed in the EPIC EMR & discussed w/ the patient...   Assessment & Plan:    SARCOIDOSIS>  Main manifestation has been her hypercalcemia> she has proven that when her Pred is weaned to 38mQod the hypercalcemia returns; we are trying to determine her lowest dose threshold & Ca is now 9.4 on Pred582md (after recent bump by primary care); we decided to continue the same dose for now...  Hypercalcemia>  Initially responded to splenectomy but her calcium level rose again over several months after the surg; we were forced to use Pred rx in this difficult diabetic; we had her see DrAltheimer for DM/ endocrine consult; calcium levels have responded nicely to Pred Rx but climbed back up when Pred was weaned to 75m43md early in 2014 (see above)... Ca=9.4 now on Pred75mg375m..  ASTHMATIC BRONCHITS>  ER 11/13 bumped her Pred to 40mg73mhich negated our slow taper w/ monitoring of her Calcium levels; NEB Rx w/ albut seemed to give the the max benefit & we need to wean down the Pred as much as poss; she is also hypoxemic w/ O2sat=84% on RA (likely from V/Q mismatching);  We decided to Rx w/ HomeO2- and she improved over time; now on AdvaiAnthonyeeded, plus the Pred75mg u73m for her hypercalcemia...  RESTRICTIVE LUNG DISEASE> from her Obesity & Sarcoid; encouraged to lose weight...   She has  [facility]/ [facility] HBP>   DIASTOLIC CHF>   DrMcLean & DrAllred for Cards... DM>   Dr Altheimer, Endocrine... Renal Insuffic>  DrSanford, Nephrology & DrFields for vasc access... Psoriasis>   DrTafeen for Derm..Ryder Systemher medical problems as noted...Marland KitchenMarland Kitchen  Patient's Medications  New Prescriptions   CHLORPHENIRAMINE-HYDROCODONE (TUSSIONEX PENNKINETIC ER) 10-8 MG/5ML LQCR    Take 5 mLs by mouth every 12 (twelve) hours as needed for cough.   METOCLOPRAMIDE (REGLAN) 10 MG TABLET    Take 1 tablet (10 mg total) by mouth at bedtime.  Previous Medications   ALBUTEROL (PROVENTIL HFA;VENTOLIN HFA) 108 (90 BASE) MCG/ACT INHALER    Inhale 2 puffs into the lungs every 6 (six) hours as needed for wheezing or shortness of breath.   ALPHA-D-GALACTOSIDASE (BEANO PO)    Take 1 tablet by mouth daily.   APREMILAST (OTEZLA) 30 MG TABS    Take 30 mg by mouth daily.    ASPIRIN EC 81 MG TABLET    Take 81 mg by mouth daily.   ATORVASTATIN (LIPITOR) 20 MG TABLET    Take 1 tablet (20 mg total) by mouth daily.   DOXYCYCLINE HYCLATE PO    Take by mouth 2 (two) times daily.   ESOMEPRAZOLE (NEXIUM) 40 MG CAPSULE    Take 1 capsule (40 mg total) by mouth 2 (two) times daily.   FERROUS SULFATE 325 (65 FE) MG TABLET    Take 325 mg by mouth daily with breakfast.   FLUTICASONE (FLONASE) 50 MCG/ACT NASAL SPRAY    Place 2 sprays into the nose at bedtime.   FLUTICASONE-SALMETEROL (ADVAIR) 250-50 MCG/DOSE AEPB    Inhale 1 puff into the lungs 2 (two) times daily.   HYDRALAZINE (APRESOLINE) 25 MG TABLET    Take 25 mg by mouth 3 (three) times daily.   HYDROXYZINE (ATARAX/VISTARIL) 25 MG TABLET    Take 25 mg by mouth every 4 (four) hours as needed for itching.   INSULIN DETEMIR (LEVEMIR) 100 UNIT/ML INJECTION    Inject 50 Units into the skin 2 (two) times daily.    INSULIN LISPRO (HUMALOG KWIKPEN) 100 UNIT/ML SOPN    Take 30 units in the morning, 40 units in the afternoon and 40 units in the  evening.   ISOSORBIDE DINITRATE (ISORDIL) 20 MG TABLET    Take 1 tablet (20 mg total) by mouth 3 (three) times daily.   KETOROLAC (ACULAR LS) 0.4 % SOLN    Place 1 drop into both eyes 3 (three) times daily.    LANCETS MISC    1 Units by Does not apply route 4 (four) times daily - after meals and at bedtime.   LINACLOTIDE (LINZESS) 290 MCG CAPS CAPSULE    Take 290 mcg by mouth every other day.   LORAZEPAM (ATIVAN) 1 MG TABLET    Take 0.5-1 mg by mouth 3 (three) times daily as needed for anxiety.   METOPROLOL TARTRATE (LOPRESSOR) 25 MG TABLET    Take 0.5 tablets (12.5 mg total) by mouth 2 (two) times daily.   MULTIPLE VITAMINS-MINERALS (MULTIVITAMIN WITH MINERALS) TABLET    Take 1 tablet by mouth daily.   ONDANSETRON (ZOFRAN) 8 MG TABLET    Take 1 tablet (8 mg total) by mouth every 8 (eight) hours as needed for nausea.   PREDNISONE (DELTASONE) 5 MG TABLET    Take 5 mg by mouth daily with breakfast.   PREGABALIN (LYRICA) 100 MG CAPSULE    Take 1 capsule (100 mg total) by mouth every evening.   TORSEMIDE (DEMADEX) 20 MG TABLET    Take 2 tablets (40 mg total) by mouth daily.   TRIAMCINOLONE CREAM (KENALOG) 0.1 %    Apply 1 application topically 2 (two) times daily.  Modified Medications   Modified Medication  Previous Medication   ALUM & MAG HYDROXIDE-SIMETH (MAGIC MOUTHWASH) SOLN Alum & Mag Hydroxide-Simeth (MAGIC MOUTHWASH) SOLN      Take 5 mLs by mouth 4 (four) times daily as needed for mouth pain.    Take 5 mLs by mouth 4 (four) times daily as needed for mouth pain.   TRAMADOL (ULTRAM) 50 MG TABLET traMADol (ULTRAM) 50 MG tablet      Take 1 tablet (50 mg total) by mouth 3 (three) times daily as needed for moderate pain or severe pain.    Take 1 tablet (50 mg total) by mouth 3 (three) times daily as needed for moderate pain or severe pain.  Discontinued Medications   No medications on file

## 2014-02-19 ENCOUNTER — Encounter (HOSPITAL_COMMUNITY): Payer: Self-pay

## 2014-02-21 ENCOUNTER — Encounter (HOSPITAL_COMMUNITY)
Admission: RE | Admit: 2014-02-21 | Discharge: 2014-02-21 | Disposition: A | Discharge status: Home / Self Care | Payer: Self-pay | Source: Ambulatory Visit | Attending: Pulmonary Disease | Admitting: Pulmonary Disease

## 2014-02-26 ENCOUNTER — Encounter (HOSPITAL_COMMUNITY): Payer: Self-pay

## 2014-02-28 ENCOUNTER — Encounter (HOSPITAL_COMMUNITY): Payer: Self-pay

## 2014-03-06 ENCOUNTER — Encounter: Payer: Self-pay | Admitting: Vascular Surgery

## 2014-03-06 ENCOUNTER — Ambulatory Visit (HOSPITAL_COMMUNITY)
Admission: RE | Admit: 2014-03-06 | Discharge: 2014-03-06 | Disposition: A | Discharge status: Home / Self Care | Payer: Medicare Other | Source: Ambulatory Visit | Attending: Vascular Surgery | Admitting: Vascular Surgery

## 2014-03-06 ENCOUNTER — Other Ambulatory Visit: Payer: Self-pay | Admitting: Vascular Surgery

## 2014-03-06 DIAGNOSIS — N186 End stage renal disease: Secondary | ICD-10-CM | POA: Diagnosis present

## 2014-03-06 DIAGNOSIS — Z4931 Encounter for adequacy testing for hemodialysis: Secondary | ICD-10-CM | POA: Insufficient documentation

## 2014-03-06 DIAGNOSIS — Z01810 Encounter for preprocedural cardiovascular examination: Secondary | ICD-10-CM

## 2014-03-07 ENCOUNTER — Ambulatory Visit (INDEPENDENT_AMBULATORY_CARE_PROVIDER_SITE_OTHER): Payer: Self-pay | Admitting: Vascular Surgery

## 2014-03-07 ENCOUNTER — Other Ambulatory Visit: Payer: Self-pay | Admitting: *Deleted

## 2014-03-07 ENCOUNTER — Encounter: Payer: Self-pay | Admitting: *Deleted

## 2014-03-07 VITALS — BP 128/56 | HR 86 | Resp 18 | Ht 62.0 in | Wt 219.0 lb

## 2014-03-07 DIAGNOSIS — N186 End stage renal disease: Secondary | ICD-10-CM

## 2014-03-07 NOTE — Progress Notes (Signed)
Patient is a 68 year old female returns for followup today. She had a left radiocephalic AV fistula placed on June 1. The vein was fairly small. The artery was also small. The patient is currently not on dialysis.  Physical exam:  Filed Vitals:   03/07/14 1357  BP: 128/56  Pulse: 86  Resp: 18  Height: 5\' 2"  (1.575 m)  Weight: 219 lb (99.338 kg)    Left radiocephalic incision is healing well. There is a faint audible bruit in the fistula there is no palpable thrill the fistula is small, 2+ brachial pulse Neuro: Sensation and motor intact left hand, occasionally has some tingling of digits one and 2 left hand  Previous vein mapping was reviewed which shows the cephalic vein in the upper arm is 3-4 mm diameter  Assessment: Non-maturing AV fistula left arm. This was a small vein. I believe the best option at this point would be to ligate the left radiocephalic AV fistula in place a left brachiocephalic AV fistula. Risks benefits possible complications and procedure details were explained the patient today. She understands and agrees to proceed. We will ligate her left radiocephalic AV fistula especially in light of that she is having some numbness and tingling in digits one and 2. However she does have some cervical spine disease as well and this may be contributing.  Ruta Hinds, MD [facility] Office: [phone] Pager: [phone]

## 2014-03-11 ENCOUNTER — Inpatient Hospital Stay (HOSPITAL_COMMUNITY): Admission: RE | Admit: 2014-03-11 | Payer: Medicare Other | Source: Ambulatory Visit

## 2014-03-11 MED ORDER — CHLORHEXIDINE GLUCONATE CLOTH 2 % EX PADS: 6.0000 | MEDICATED_PAD | Freq: Once | CUTANEOUS | Status: DC

## 2014-03-11 MED ORDER — CEFUROXIME SODIUM 1.5 G IJ SOLR
1.5000 g | INTRAMUSCULAR | Status: AC
  Administered 2014-03-18: 1.5 g via INTRAVENOUS
  Filled 2014-03-11: qty 1.5

## 2014-03-11 MED ORDER — SODIUM CHLORIDE 0.9 % IV SOLN
INTRAVENOUS | Status: DC
  Administered 2014-03-18: 07:00:00 via INTRAVENOUS

## 2014-03-15 ENCOUNTER — Encounter (HOSPITAL_COMMUNITY): Payer: Self-pay | Admitting: *Deleted

## 2014-03-15 ENCOUNTER — Encounter (HOSPITAL_COMMUNITY): Payer: Self-pay | Admitting: Pharmacy Technician

## 2014-03-18 ENCOUNTER — Ambulatory Visit (HOSPITAL_COMMUNITY): Payer: Medicare Other | Admitting: Anesthesiology

## 2014-03-18 ENCOUNTER — Encounter (HOSPITAL_COMMUNITY): Payer: Medicare Other | Admitting: Anesthesiology

## 2014-03-18 ENCOUNTER — Telehealth: Payer: Self-pay | Admitting: Vascular Surgery

## 2014-03-18 ENCOUNTER — Encounter (HOSPITAL_COMMUNITY)
Admission: RE | Disposition: A | Discharge status: Home / Self Care | Payer: Self-pay | Source: Ambulatory Visit | Attending: Vascular Surgery

## 2014-03-18 ENCOUNTER — Ambulatory Visit (HOSPITAL_COMMUNITY)
Admission: RE | Admit: 2014-03-18 | Discharge: 2014-03-18 | Disposition: A | Discharge status: Home / Self Care | Payer: Medicare Other | Source: Ambulatory Visit | Attending: Vascular Surgery | Admitting: Vascular Surgery

## 2014-03-18 ENCOUNTER — Encounter (HOSPITAL_COMMUNITY): Payer: Self-pay | Admitting: *Deleted

## 2014-03-18 DIAGNOSIS — I120 Hypertensive chronic kidney disease with stage 5 chronic kidney disease or end stage renal disease: Secondary | ICD-10-CM | POA: Diagnosis not present

## 2014-03-18 DIAGNOSIS — Z794 Long term (current) use of insulin: Secondary | ICD-10-CM | POA: Diagnosis not present

## 2014-03-18 DIAGNOSIS — E119 Type 2 diabetes mellitus without complications: Secondary | ICD-10-CM | POA: Diagnosis not present

## 2014-03-18 DIAGNOSIS — Z7982 Long term (current) use of aspirin: Secondary | ICD-10-CM | POA: Insufficient documentation

## 2014-03-18 DIAGNOSIS — Z79899 Other long term (current) drug therapy: Secondary | ICD-10-CM | POA: Diagnosis not present

## 2014-03-18 DIAGNOSIS — Z91040 Latex allergy status: Secondary | ICD-10-CM | POA: Diagnosis not present

## 2014-03-18 DIAGNOSIS — Z885 Allergy status to narcotic agent status: Secondary | ICD-10-CM | POA: Insufficient documentation

## 2014-03-18 DIAGNOSIS — Y832 Surgical operation with anastomosis, bypass or graft as the cause of abnormal reaction of the patient, or of later complication, without mention of misadventure at the time of the procedure: Secondary | ICD-10-CM | POA: Insufficient documentation

## 2014-03-18 DIAGNOSIS — N186 End stage renal disease: Secondary | ICD-10-CM | POA: Diagnosis not present

## 2014-03-18 DIAGNOSIS — T82898A Other specified complication of vascular prosthetic devices, implants and grafts, initial encounter: Secondary | ICD-10-CM

## 2014-03-18 DIAGNOSIS — N185 Chronic kidney disease, stage 5: Secondary | ICD-10-CM

## 2014-03-18 DIAGNOSIS — N259 Disorder resulting from impaired renal tubular function, unspecified: Secondary | ICD-10-CM

## 2014-03-18 HISTORY — PX: AV FISTULA PLACEMENT: SHX1204

## 2014-03-18 HISTORY — DX: Headache: R51

## 2014-03-18 HISTORY — PX: LIGATION OF ARTERIOVENOUS  FISTULA: SHX5948

## 2014-03-18 LAB — POCT I-STAT 4, (NA,K, GLUC, HGB,HCT)
Glucose, Bld: 164 mg/dL — ABNORMAL HIGH (ref 70–99)
HEMATOCRIT: 34 % — AB (ref 36.0–46.0)
Hemoglobin: 11.6 g/dL — ABNORMAL LOW (ref 12.0–15.0)
Potassium: 3.3 mEq/L — ABNORMAL LOW (ref 3.7–5.3)
SODIUM: 142 meq/L (ref 137–147)

## 2014-03-18 LAB — GLUCOSE, CAPILLARY
Glucose-Capillary: 163 mg/dL — ABNORMAL HIGH (ref 70–99)
Glucose-Capillary: 192 mg/dL — ABNORMAL HIGH (ref 70–99)

## 2014-03-18 SURGERY — ARTERIOVENOUS (AV) FISTULA CREATION
Anesthesia: General | Site: Arm Upper | Laterality: Left

## 2014-03-18 MED ORDER — ONDANSETRON HCL 4 MG/2ML IJ SOLN
INTRAMUSCULAR | Status: AC
  Filled 2014-03-18: qty 2

## 2014-03-18 MED ORDER — PROPOFOL 10 MG/ML IV BOLUS
INTRAVENOUS | Status: DC | PRN
  Administered 2014-03-18: 150 mg via INTRAVENOUS

## 2014-03-18 MED ORDER — LIDOCAINE HCL (CARDIAC) 20 MG/ML IV SOLN
INTRAVENOUS | Status: DC | PRN
  Administered 2014-03-18: 40 mg via INTRAVENOUS

## 2014-03-18 MED ORDER — LIDOCAINE HCL (CARDIAC) 20 MG/ML IV SOLN
INTRAVENOUS | Status: AC
  Filled 2014-03-18: qty 5

## 2014-03-18 MED ORDER — SODIUM CHLORIDE 0.9 % IV SOLN
10.0000 mg | INTRAVENOUS | Status: DC | PRN
  Administered 2014-03-18: 20 ug/min via INTRAVENOUS

## 2014-03-18 MED ORDER — MIDAZOLAM HCL 5 MG/5ML IJ SOLN
INTRAMUSCULAR | Status: DC | PRN
  Administered 2014-03-18: 1 mg via INTRAVENOUS

## 2014-03-18 MED ORDER — SODIUM CHLORIDE 0.9 % IR SOLN
Status: DC | PRN
  Administered 2014-03-18: 07:00:00

## 2014-03-18 MED ORDER — ONDANSETRON HCL 4 MG/2ML IJ SOLN: 4.0000 mg | Freq: Once | INTRAMUSCULAR | Status: DC | PRN

## 2014-03-18 MED ORDER — 0.9 % SODIUM CHLORIDE (POUR BTL) OPTIME
TOPICAL | Status: DC | PRN
  Administered 2014-03-18: 1000 mL

## 2014-03-18 MED ORDER — FENTANYL CITRATE 0.05 MG/ML IJ SOLN
INTRAMUSCULAR | Status: DC | PRN
  Administered 2014-03-18: 100 ug via INTRAVENOUS

## 2014-03-18 MED ORDER — PROPOFOL 10 MG/ML IV BOLUS
INTRAVENOUS | Status: AC
  Filled 2014-03-18: qty 20

## 2014-03-18 MED ORDER — MIDAZOLAM HCL 2 MG/2ML IJ SOLN
INTRAMUSCULAR | Status: AC
  Filled 2014-03-18: qty 2

## 2014-03-18 MED ORDER — FENTANYL CITRATE 0.05 MG/ML IJ SOLN
INTRAMUSCULAR | Status: AC
  Filled 2014-03-18: qty 2

## 2014-03-18 MED ORDER — ONDANSETRON HCL 4 MG/2ML IJ SOLN
INTRAMUSCULAR | Status: DC | PRN
  Administered 2014-03-18: 4 mg via INTRAVENOUS

## 2014-03-18 MED ORDER — OXYCODONE-ACETAMINOPHEN 5-325 MG PO TABS: 1.0000 | ORAL_TABLET | Freq: Four times a day (QID) | ORAL | Status: DC | PRN

## 2014-03-18 MED ORDER — FENTANYL CITRATE 0.05 MG/ML IJ SOLN
INTRAMUSCULAR | Status: AC
  Filled 2014-03-18: qty 5

## 2014-03-18 MED ORDER — HYDROCORTISONE NA SUCCINATE PF 250 MG IJ SOLR
INTRAMUSCULAR | Status: AC
  Filled 2014-03-18: qty 250

## 2014-03-18 MED ORDER — FENTANYL CITRATE 0.05 MG/ML IJ SOLN
25.0000 ug | INTRAMUSCULAR | Status: DC | PRN
  Administered 2014-03-18 (×2): 25 ug via INTRAVENOUS

## 2014-03-18 MED ORDER — LIDOCAINE HCL (PF) 1 % IJ SOLN
INTRAMUSCULAR | Status: AC
  Filled 2014-03-18: qty 30

## 2014-03-18 MED ORDER — HEPARIN SODIUM (PORCINE) 1000 UNIT/ML IJ SOLN
INTRAMUSCULAR | Status: DC | PRN
  Administered 2014-03-18: 7000 [IU] via INTRAVENOUS

## 2014-03-18 MED ORDER — HYDROCORTISONE NA SUCCINATE PF 1000 MG IJ SOLR
INTRAMUSCULAR | Status: DC | PRN
  Administered 2014-03-18: 100 mg via INTRAVENOUS

## 2014-03-18 SURGICAL SUPPLY — 49 items
ADH SKN CLS APL DERMABOND .7 (GAUZE/BANDAGES/DRESSINGS) ×2
APL SKNCLS STERI-STRIP NONHPOA (GAUZE/BANDAGES/DRESSINGS) ×2
ARMBAND PINK RESTRICT EXTREMIT (MISCELLANEOUS) ×3 IMPLANT
BENZOIN TINCTURE PRP APPL 2/3 (GAUZE/BANDAGES/DRESSINGS) ×1 IMPLANT
CANISTER SUCTION 2500CC (MISCELLANEOUS) ×3 IMPLANT
CLIP TI MEDIUM 6 (CLIP) ×3 IMPLANT
CLIP TI WIDE RED SMALL 6 (CLIP) ×3 IMPLANT
COVER PROBE W GEL 5X96 (DRAPES) ×3 IMPLANT
COVER SURGICAL LIGHT HANDLE (MISCELLANEOUS) ×3 IMPLANT
DECANTER SPIKE VIAL GLASS SM (MISCELLANEOUS) ×2 IMPLANT
DERMABOND ADVANCED (GAUZE/BANDAGES/DRESSINGS) ×1
DERMABOND ADVANCED .7 DNX12 (GAUZE/BANDAGES/DRESSINGS) ×2 IMPLANT
DRAIN PENROSE 1/4X12 LTX STRL (WOUND CARE) ×2 IMPLANT
ELECT REM PT RETURN 9FT ADLT (ELECTROSURGICAL) ×3
ELECTRODE REM PT RTRN 9FT ADLT (ELECTROSURGICAL) ×2 IMPLANT
GEL ULTRASOUND 20GR AQUASONIC (MISCELLANEOUS) ×3 IMPLANT
GLOVE BIO SURGEON STRL SZ7.5 (GLOVE) ×2 IMPLANT
GLOVE BIOGEL PI IND STRL 6.5 (GLOVE) IMPLANT
GLOVE BIOGEL PI IND STRL 7.0 (GLOVE) IMPLANT
GLOVE BIOGEL PI IND STRL 7.5 (GLOVE) IMPLANT
GLOVE BIOGEL PI INDICATOR 6.5 (GLOVE) ×1
GLOVE BIOGEL PI INDICATOR 7.0 (GLOVE) ×1
GLOVE BIOGEL PI INDICATOR 7.5 (GLOVE) ×1
GLOVE SURG SS PI 7.0 STRL IVOR (GLOVE) ×2 IMPLANT
GLOVE SURG SS PI 7.5 STRL IVOR (GLOVE) ×1 IMPLANT
GOWN STRL REUS W/ TWL LRG LVL3 (GOWN DISPOSABLE) ×6 IMPLANT
GOWN STRL REUS W/ TWL XL LVL3 (GOWN DISPOSABLE) IMPLANT
GOWN STRL REUS W/TWL LRG LVL3 (GOWN DISPOSABLE) ×6
GOWN STRL REUS W/TWL XL LVL3 (GOWN DISPOSABLE) ×3
KIT BASIN OR (CUSTOM PROCEDURE TRAY) ×3 IMPLANT
KIT ROOM TURNOVER OR (KITS) ×3 IMPLANT
LOOP VESSEL MINI RED (MISCELLANEOUS) IMPLANT
NS IRRIG 1000ML POUR BTL (IV SOLUTION) ×3 IMPLANT
PACK CV ACCESS (CUSTOM PROCEDURE TRAY) ×3 IMPLANT
PAD ARMBOARD 7.5X6 YLW CONV (MISCELLANEOUS) ×6 IMPLANT
PROBE PENCIL 8 MHZ STRL DISP (MISCELLANEOUS) ×1 IMPLANT
SPONGE SURGIFOAM ABS GEL 100 (HEMOSTASIS) IMPLANT
SUT PROLENE 6 0 CC (SUTURE) IMPLANT
SUT PROLENE 7 0 BV 1 (SUTURE) ×3 IMPLANT
SUT SILK 0 (SUTURE) IMPLANT
SUT SILK 3 0 (SUTURE) ×3
SUT SILK 3-0 18XBRD TIE 12 (SUTURE) IMPLANT
SUT VIC AB 3-0 SH 27 (SUTURE) ×6
SUT VIC AB 3-0 SH 27X BRD (SUTURE) ×2 IMPLANT
SUT VICRYL 4-0 PS2 18IN ABS (SUTURE) ×4 IMPLANT
TOWEL OR 17X24 6PK STRL BLUE (TOWEL DISPOSABLE) ×3 IMPLANT
TOWEL OR 17X26 10 PK STRL BLUE (TOWEL DISPOSABLE) ×3 IMPLANT
UNDERPAD 30X30 INCONTINENT (UNDERPADS AND DIAPERS) ×3 IMPLANT
WATER STERILE IRR 1000ML POUR (IV SOLUTION) ×2 IMPLANT

## 2014-03-18 NOTE — H&P (View-Only) (Signed)
Patient is a 68 year old female returns for followup today. She had a left radiocephalic AV fistula placed on June 1. The vein was fairly small. The artery was also small. The patient is currently not on dialysis.  Physical exam:  Filed Vitals:   03/07/14 1357  BP: 128/56  Pulse: 86  Resp: 18  Height: 5\' 2"  (1.575 m)  Weight: 219 lb (99.338 kg)    Left radiocephalic incision is healing well. There is a faint audible bruit in the fistula there is no palpable thrill the fistula is small, 2+ brachial pulse Neuro: Sensation and motor intact left hand, occasionally has some tingling of digits one and 2 left hand  Previous vein mapping was reviewed which shows the cephalic vein in the upper arm is 3-4 mm diameter  Assessment: Non-maturing AV fistula left arm. This was a small vein. I believe the best option at this point would be to ligate the left radiocephalic AV fistula in place a left brachiocephalic AV fistula. Risks benefits possible complications and procedure details were explained the patient today. She understands and agrees to proceed. We will ligate her left radiocephalic AV fistula especially in light of that she is having some numbness and tingling in digits one and 2. However she does have some cervical spine disease as well and this may be contributing.  Ruta Hinds, MD [facility] Office: [phone] Pager: [phone]

## 2014-03-18 NOTE — Transfer of Care (Signed)
Immediate Anesthesia Transfer of Care Note  Patient: [name]  Procedure(s) Performed: Procedure(s): ARTERIOVENOUS (AV) FISTULA CREATION- LEFT BRACHIOCEPHALIC  (Left) LIGATION OF ARTERIOVENOUS  FISTULA- LEFT RADIOCEPHALIC (Left)  Patient Location: PACU  Anesthesia Type:General  Level of Consciousness: awake, alert  and oriented  Airway & Oxygen Therapy: Patient Spontanous Breathing and Patient connected to nasal cannula oxygen  Post-op Assessment: Report given to PACU RN, Post -op Vital signs reviewed and stable and Patient moving all extremities X 4  Post vital signs: Reviewed and stable  Complications: No apparent anesthesia complications

## 2014-03-18 NOTE — Progress Notes (Signed)
Dr.Joslin states he will sign pt out

## 2014-03-18 NOTE — Anesthesia Preprocedure Evaluation (Addendum)
Anesthesia Evaluation  Patient identified by MRN, date of birth, ID band Patient awake    Reviewed: Allergy & Precautions, H&P , NPO status , Patient's Chart, lab work & pertinent test results  Airway Mallampati: II TM Distance: >3 FB Neck ROM: Full    Dental  (+) Partial Upper, Poor Dentition   Pulmonary    + decreased breath sounds      Cardiovascular hypertension, Rhythm:Regular Rate:Normal     Neuro/Psych    GI/Hepatic   Endo/Other  diabetes  Renal/GU      Musculoskeletal   Abdominal   Peds  Hematology   Anesthesia Other Findings   Reproductive/Obstetrics                          Anesthesia Physical Anesthesia Plan  ASA: III  Anesthesia Plan: General   Post-op Pain Management:    Induction: Intravenous  Airway Management Planned: LMA  Additional Equipment:   Intra-op Plan:   Post-operative Plan:   Informed Consent: I have reviewed the patients History and Physical, chart, labs and discussed the procedure including the risks, benefits and alternatives for the proposed anesthesia with the patient or authorized representative who has indicated his/her understanding and acceptance.   Dental advisory given  Plan Discussed with: CRNA and Anesthesiologist  Anesthesia Plan Comments: (Needs peri-op steroid coverage)       Anesthesia Quick Evaluation

## 2014-03-18 NOTE — Telephone Encounter (Signed)
Message copied by Gena Fray on Mon Mar 18, 2014  1:00 PM ------      Message from: Denman George      Created: Mon Mar 18, 2014 11:14 AM      Regarding: needs 2 wk. f/u with CE[facility]; no duplex needed                   ----- Message -----         From: Ulyses Amor, PA-C         Sent: 03/18/2014   9:33 AM           To: [facility] Charge Pool            F/U with Dr. Oneida Alar in 2 weeks s/p left upper arm AV fistula 1st stage-no study needed at this visit. ------

## 2014-03-18 NOTE — Anesthesia Procedure Notes (Signed)
Procedure Name: LMA Insertion Date/Time: 03/18/2014 7:39 AM Performed by: Rush Farmer E Pre-anesthesia Checklist: Patient identified, Emergency Drugs available, Suction available, Patient being monitored and Timeout performed Patient Re-evaluated:Patient Re-evaluated prior to inductionOxygen Delivery Method: Circle system utilized Preoxygenation: Pre-oxygenation with 100% oxygen Intubation Type: IV induction Ventilation: Mask ventilation without difficulty LMA: LMA inserted LMA Size: 4.0 Number of attempts: 1 Placement Confirmation: positive ETCO2 and breath sounds checked- equal and bilateral Tube secured with: Tape Dental Injury: Teeth and Oropharynx as per pre-operative assessment

## 2014-03-18 NOTE — Telephone Encounter (Signed)
Lm for pt with appt date/time, dpm

## 2014-03-18 NOTE — Interval H&P Note (Signed)
History and Physical Interval Note:  03/18/2014 7:25 AM  [name]  has presented today for surgery, with the diagnosis of Mechanical complication of other vascular device, implant, and graft  The various methods of treatment have been discussed with the patient and family. After consideration of risks, benefits and other options for treatment, the patient has consented to  Procedure(s): ARTERIOVENOUS (AV) FISTULA CREATION- LEFT BRACHIOCEPHALIC  (Left) LIGATION OF ARTERIOVENOUS  FISTULA- LEFT RADIOCEPHALIC (Left) as a surgical intervention .  The patient's history has been reviewed, patient examined, no change in status, stable for surgery.  I have reviewed the patient's chart and labs.  Questions were answered to the patient's satisfaction.     FIELDS,CHARLES E

## 2014-03-18 NOTE — Op Note (Signed)
Procedure: Ligation of left radial cephalic AV fistula placement Left Brachial Cephalic AV fistula  Preop: ESRD  Postop: ESRD  Anesthesia: General  Assistant: Gerri Lins PA-C  Findings: 3 mm cephalic vein, 2.5 mm brachial artery  Procedure: After obtaining informed consent, the patient was taken to the operating room.  After induction of general anesthesia, the left upper extremity was prepped and draped in usual sterile fashion.  A longitudinal incision was made through a preexisting scar at the left wrist.  Dissection was carried down to the pre existing radial cephalic AV fistula.  It was slightly pulsatile.  It was then ligated with three 3 0 silk ties to occlude it.  A transverse incision was then made near the antecubital crease the left arm. The incision was carried into the subcutaneous tissues down to level of the cephalic vein. The cephalic vein was approximately 3 mm in diameter. It was of good quality. This was dissected free circumferentially and small side branches ligated and divided between silk ties or clips. Next the brachial artery was dissected free in the medial portion of the incision. The artery was  3 mm in diameter and fairly small after developing some spasm. The vessel loops were placed proximal and distal to the planned site of arteriotomy. The patient was given 7000 units of intravenous heparin. After appropriate circulation time, the vessel loops were used to control the artery. A longitudinal opening was made in the brachial artery.  The vein was ligated distally with a 3-0 silk tie. The vein was controlled proximally with a fine bulldog clamp. The vein was then swung over to the artery and sewn end of vein to side of artery using a running 7-0 Prolene suture. Just prior to completion of the anastomosis, everything was fore bled back bled and thoroughly flushed. The anastomosis was secured, vessel loops released, and there was good doppler flow in the fistula  immediately. After hemostasis was obtained, the subcutaneous tissues were reapproximated using a running 3-0 Vicryl suture in each incision. The skin of each incision was then closed with a 4 0 Vicryl subcuticular stitch. Dermabond was applied to the skin incisions.  Instrument sponge and needle count was correct at the end of the case.  The patient was take to PACU in stable condition.  Ruta Hinds, MD [facility] Office: [phone] Pager: [phone]

## 2014-03-18 NOTE — Anesthesia Postprocedure Evaluation (Signed)
  Anesthesia Post-op Note  Patient: [name]  Procedure(s) Performed: Procedure(s): ARTERIOVENOUS (AV) FISTULA CREATION- LEFT BRACHIOCEPHALIC  (Left) LIGATION OF ARTERIOVENOUS  FISTULA- LEFT RADIOCEPHALIC (Left)  Patient Location: PACU  Anesthesia Type:General  Level of Consciousness: awake, alert  and oriented  Airway and Oxygen Therapy: Patient Spontanous Breathing and Patient connected to nasal cannula oxygen  Post-op Pain: mild  Post-op Assessment: Post-op Vital signs reviewed, Patient's Cardiovascular Status Stable, Respiratory Function Stable, Patent Airway, No signs of Nausea or vomiting and Pain level controlled  Post-op Vital Signs: stable  Last Vitals:  Filed Vitals:   03/18/14 1135  BP: 131/56  Pulse: 66  Temp:   Resp:     Complications: No apparent anesthesia complications

## 2014-03-20 ENCOUNTER — Encounter (HOSPITAL_COMMUNITY): Payer: Self-pay | Admitting: Vascular Surgery

## 2014-03-20 NOTE — Addendum Note (Signed)
Addendum created 03/20/14 2024 by Roberts Gaudy, MD   Modules edited: Anesthesia Attestations

## 2014-03-22 ENCOUNTER — Other Ambulatory Visit: Payer: Self-pay | Admitting: Pulmonary Disease

## 2014-03-22 MED ORDER — FLUTICASONE-SALMETEROL 250-50 MCG/DOSE IN AEPB
1.0000 | INHALATION_SPRAY | Freq: Two times a day (BID) | RESPIRATORY_TRACT | Status: DC
Start: 2014-03-22 — End: 2014-05-12

## 2014-03-22 MED ORDER — METOCLOPRAMIDE HCL 10 MG PO TABS: 10.0000 mg | ORAL_TABLET | Freq: Every day | ORAL | Status: DC

## 2014-03-28 ENCOUNTER — Encounter (HOSPITAL_COMMUNITY): Payer: Self-pay | Admitting: Emergency Medicine

## 2014-03-28 ENCOUNTER — Emergency Department (HOSPITAL_COMMUNITY): Payer: Medicare Other

## 2014-03-28 ENCOUNTER — Emergency Department (HOSPITAL_COMMUNITY)
Admission: EM | Admit: 2014-03-28 | Discharge: 2014-03-28 | Disposition: A | Discharge status: Home / Self Care | Payer: Medicare Other | Attending: Emergency Medicine | Admitting: Emergency Medicine

## 2014-03-28 DIAGNOSIS — E785 Hyperlipidemia, unspecified: Secondary | ICD-10-CM | POA: Insufficient documentation

## 2014-03-28 DIAGNOSIS — I509 Heart failure, unspecified: Secondary | ICD-10-CM | POA: Insufficient documentation

## 2014-03-28 DIAGNOSIS — G4733 Obstructive sleep apnea (adult) (pediatric): Secondary | ICD-10-CM | POA: Diagnosis not present

## 2014-03-28 DIAGNOSIS — F411 Generalized anxiety disorder: Secondary | ICD-10-CM | POA: Insufficient documentation

## 2014-03-28 DIAGNOSIS — Z794 Long term (current) use of insulin: Secondary | ICD-10-CM | POA: Diagnosis not present

## 2014-03-28 DIAGNOSIS — IMO0002 Reserved for concepts with insufficient information to code with codable children: Secondary | ICD-10-CM | POA: Insufficient documentation

## 2014-03-28 DIAGNOSIS — Z860100 Personal history of colon polyps, unspecified: Secondary | ICD-10-CM | POA: Insufficient documentation

## 2014-03-28 DIAGNOSIS — Z8619 Personal history of other infectious and parasitic diseases: Secondary | ICD-10-CM | POA: Insufficient documentation

## 2014-03-28 DIAGNOSIS — Z9889 Other specified postprocedural states: Secondary | ICD-10-CM | POA: Diagnosis not present

## 2014-03-28 DIAGNOSIS — E669 Obesity, unspecified: Secondary | ICD-10-CM | POA: Diagnosis not present

## 2014-03-28 DIAGNOSIS — Z8701 Personal history of pneumonia (recurrent): Secondary | ICD-10-CM | POA: Insufficient documentation

## 2014-03-28 DIAGNOSIS — K219 Gastro-esophageal reflux disease without esophagitis: Secondary | ICD-10-CM | POA: Diagnosis not present

## 2014-03-28 DIAGNOSIS — E162 Hypoglycemia, unspecified: Secondary | ICD-10-CM

## 2014-03-28 DIAGNOSIS — G8929 Other chronic pain: Secondary | ICD-10-CM | POA: Insufficient documentation

## 2014-03-28 DIAGNOSIS — E1169 Type 2 diabetes mellitus with other specified complication: Secondary | ICD-10-CM | POA: Insufficient documentation

## 2014-03-28 DIAGNOSIS — K589 Irritable bowel syndrome without diarrhea: Secondary | ICD-10-CM | POA: Insufficient documentation

## 2014-03-28 DIAGNOSIS — R0602 Shortness of breath: Secondary | ICD-10-CM | POA: Diagnosis present

## 2014-03-28 DIAGNOSIS — Z872 Personal history of diseases of the skin and subcutaneous tissue: Secondary | ICD-10-CM | POA: Diagnosis not present

## 2014-03-28 DIAGNOSIS — Z791 Long term (current) use of non-steroidal anti-inflammatories (NSAID): Secondary | ICD-10-CM | POA: Diagnosis not present

## 2014-03-28 DIAGNOSIS — D649 Anemia, unspecified: Secondary | ICD-10-CM | POA: Insufficient documentation

## 2014-03-28 DIAGNOSIS — Z9981 Dependence on supplemental oxygen: Secondary | ICD-10-CM | POA: Diagnosis not present

## 2014-03-28 DIAGNOSIS — Z79899 Other long term (current) drug therapy: Secondary | ICD-10-CM | POA: Insufficient documentation

## 2014-03-28 DIAGNOSIS — Z7982 Long term (current) use of aspirin: Secondary | ICD-10-CM | POA: Diagnosis not present

## 2014-03-28 DIAGNOSIS — Z91040 Latex allergy status: Secondary | ICD-10-CM | POA: Insufficient documentation

## 2014-03-28 DIAGNOSIS — Z87448 Personal history of other diseases of urinary system: Secondary | ICD-10-CM | POA: Diagnosis not present

## 2014-03-28 DIAGNOSIS — J441 Chronic obstructive pulmonary disease with (acute) exacerbation: Secondary | ICD-10-CM | POA: Diagnosis not present

## 2014-03-28 DIAGNOSIS — M129 Arthropathy, unspecified: Secondary | ICD-10-CM | POA: Insufficient documentation

## 2014-03-28 DIAGNOSIS — I2510 Atherosclerotic heart disease of native coronary artery without angina pectoris: Secondary | ICD-10-CM | POA: Diagnosis not present

## 2014-03-28 DIAGNOSIS — J45901 Unspecified asthma with (acute) exacerbation: Principal | ICD-10-CM

## 2014-03-28 DIAGNOSIS — I10 Essential (primary) hypertension: Secondary | ICD-10-CM | POA: Diagnosis not present

## 2014-03-28 LAB — COMPREHENSIVE METABOLIC PANEL
ALBUMIN: 3 g/dL — AB (ref 3.5–5.2)
ALT: 11 U/L (ref 0–35)
ANION GAP: 18 — AB (ref 5–15)
AST: 17 U/L (ref 0–37)
Alkaline Phosphatase: 155 U/L — ABNORMAL HIGH (ref 39–117)
BUN: 59 mg/dL — AB (ref 6–23)
CO2: 23 mEq/L (ref 19–32)
CREATININE: 2.82 mg/dL — AB (ref 0.50–1.10)
Calcium: 9.9 mg/dL (ref 8.4–10.5)
Chloride: 100 mEq/L (ref 96–112)
GFR calc Af Amer: 19 mL/min — ABNORMAL LOW (ref >90)
GFR calc non Af Amer: 16 mL/min — ABNORMAL LOW (ref >90)
Glucose, Bld: 42 mg/dL — CL (ref 70–99)
POTASSIUM: 4 meq/L (ref 3.7–5.3)
Sodium: 141 mEq/L (ref 137–147)
Total Bilirubin: <0.2 mg/dL — ABNORMAL LOW (ref 0.3–1.2)
Total Protein: 7.3 g/dL (ref 6.0–8.3)

## 2014-03-28 LAB — I-STAT TROPONIN, ED: TROPONIN I, POC: 0.03 ng/mL (ref 0.00–0.08)

## 2014-03-28 LAB — CBG MONITORING, ED: Glucose-Capillary: 127 mg/dL — ABNORMAL HIGH (ref 70–99)

## 2014-03-28 LAB — CBC
HEMATOCRIT: 37 % (ref 36.0–46.0)
Hemoglobin: 11.7 g/dL — ABNORMAL LOW (ref 12.0–15.0)
MCH: 29.1 pg (ref 26.0–34.0)
MCHC: 31.6 g/dL (ref 30.0–36.0)
MCV: 92 fL (ref 78.0–100.0)
Platelets: 546 10*3/uL — ABNORMAL HIGH (ref 150–400)
RBC: 4.02 MIL/uL (ref 3.87–5.11)
RDW: 14.4 % (ref 11.5–15.5)
WBC: 15.1 10*3/uL — ABNORMAL HIGH (ref 4.0–10.5)

## 2014-03-28 MED ORDER — DOXYCYCLINE HYCLATE 100 MG PO CAPS: 100.0000 mg | ORAL_CAPSULE | Freq: Two times a day (BID) | ORAL | Status: DC

## 2014-03-28 MED ORDER — PREDNISONE 10 MG PO TABS: ORAL_TABLET | ORAL | Status: DC

## 2014-03-28 NOTE — ED Notes (Signed)
Patient in NAD at time of d/c

## 2014-03-28 NOTE — ED Notes (Signed)
Patient given po fluids and meal with no difficulty

## 2014-03-28 NOTE — ED Provider Notes (Signed)
CSN: [id number]     Arrival date & time 03/28/14  1417 History   First MD Initiated Contact with Patient 03/28/14 1654     Chief Complaint  Patient presents with  . Shortness of Breath  . Cough     (Consider location/radiation/quality/duration/timing/severity/associated sxs/prior Treatment) HPI Comments: Presents to ER for evaluation of mild shortness of breath and productive cough that has been ongoing for one week. Patient reports that she has a history of asthma COPD. She uses that all inhaler and nebulizer machines at home. She also does use oxygen when needed.  This is not expressing any chest pain. He has not had any fever.  Patient is a 68 y.o. female presenting with shortness of breath and cough.  Shortness of Breath Associated symptoms: cough   Associated symptoms: no fever   Cough Associated symptoms: shortness of breath   Associated symptoms: no fever     Past Medical History  Diagnosis Date  . Unspecified essential hypertension   . CAD (coronary artery disease) 01/04/2006    Tiny OM1 70-90% ostial stenosis, no other CAD  . Asthma   . Esophageal reflux   . Obesity, unspecified   . Anxiety state, unspecified   . Hypercalcemia   . Anemia   . Other psoriasis   . Diverticulosis   . Sarcoidosis 2012  . Dyslipidemia   . COPD (chronic obstructive pulmonary disease)   . Gout   . Colon polyps     Tubular adenomatous polyps  . Spinal stenosis of lumbar region   . Bulging lumbar disc   . CHF (congestive heart failure)   . PNA (pneumonia) 2012    With pleural effusion, requiring thoracentesis  . Pneumonia     "several times" (04/02/2013)  . Pleural effusion 2012  . Chronic bronchitis   . Exertional shortness of breath     "and lying flat" (04/02/2013)  . On home oxygen therapy     "2L when I'm up; 3L when I'm at sleep" (04/02/2013)  . OSA (obstructive sleep apnea)     marginally compliant with CPAP  . Type II diabetes mellitus   . Degenerative joint disease   .  Arthritis     'all over" (04/02/2013)  . Chronic lower back pain   . Diverticulosis   . GERD (gastroesophageal reflux disease)   . Anxiety   . Hyperlipidemia   . Sarcoidosis of lung   . Kidney disease     baseline creatinie 2.0,  folowed by Dr Hassell Done  . Renal failure     "kidney's are working @ 50%" (04/02/2013)  . Renal insufficiency     2015 now Stage 4  . IBS (irritable bowel syndrome)   . Cataracts, both eyes   . Dysrhythmia   . URKYHCWC(376.2)    Past Surgical History  Procedure Laterality Date  . Vesicovaginal fistula closure w/  total abdominal hysterectomy  1992  . Bilateral total knee replacements Bilateral Rt=5/04 & Lft=1/09    by DrAlusio  . 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 Zella Richer  . Appendectomy  1957  . Tonsillectomy  1968  . Cholecystectomy  1980's  . Abdominal hysterectomy  1992  . Reduction mammaplasty Bilateral 1980's  . Cardiac catheterization  1990's  . Thoracentesis  2012  . Joint replacement    . Av fistula placement Left 12/31/2013    Procedure: ARTERIOVENOUS (AV) FISTULA CREATION;  Surgeon: Elam Dutch, MD;  Location: [facility];  Service: Vascular;  Laterality: Left;  .  Colonoscopy w/ biopsies and polypectomy    . Av fistula placement Left 03/18/2014    Procedure: ARTERIOVENOUS (AV) FISTULA CREATION- LEFT BRACHIOCEPHALIC ;  Surgeon: Elam Dutch, MD;  Location: [facility] OR;  Service: Vascular;  Laterality: Left;  . Ligation of arteriovenous  fistula Left 03/18/2014    Procedure: LIGATION OF ARTERIOVENOUS  FISTULA- LEFT RADIOCEPHALIC;  Surgeon: Elam Dutch, MD;  Location: [facility] OR;  Service: Vascular;  Laterality: Left;   Family History  Problem Relation Age of Onset  . Diabetes Mother   . Heart disease Mother   . Hyperlipidemia Mother   . Varicose Veins Mother   . Breast cancer Maternal Aunt   . Heart disease Father   . Deep vein thrombosis Father   . Hyperlipidemia Father   . Heart disease Sister     before age 22  . Cancer Sister   .  Diabetes Sister   . Hyperlipidemia Sister   . Heart attack Sister   . Heart disease Brother   . Diabetes Brother   . Hyperlipidemia Brother   . Heart attack Brother   . Hyperlipidemia Sister    History  Substance Use Topics  . Smoking status: Never Smoker   . Smokeless tobacco: Never Used  . Alcohol Use: No   OB History   Grav Para Term Preterm Abortions TAB SAB Ect Mult Living                 Review of Systems  Constitutional: Negative for fever.  Respiratory: Positive for cough and shortness of breath.   All other systems reviewed and are negative.     Allergies  Adhesive; Avelox; Codeine; Guaifenesin; Latex; and Oxycodone  Home Medications   Prior to Admission medications   Medication Sig Start Date End Date Taking? Authorizing Provider  acetaminophen (TYLENOL) 500 MG tablet Take 1,000 mg by mouth every 6 (six) hours as needed (pain).    Historical Provider, MD  albuterol (PROVENTIL HFA;VENTOLIN HFA) 108 (90 BASE) MCG/ACT inhaler Inhale 2 puffs into the lungs every 6 (six) hours as needed for wheezing or shortness of breath.    Historical Provider, MD  Alpha-D-Galactosidase (BEANO PO) Take 1 tablet by mouth daily.    Historical Provider, MD  Alum & Mag Hydroxide-Simeth (MAGIC MOUTHWASH) SOLN Take 5 mLs by mouth 4 (four) times daily as needed for mouth pain. 02/18/14   Noralee Space, MD  Apremilast (OTEZLA) 30 MG TABS Take 30 mg by mouth daily.     Historical Provider, MD  aspirin EC 81 MG tablet Take 81 mg by mouth daily.    Historical Provider, MD  atorvastatin (LIPITOR) 20 MG tablet Take 1 tablet (20 mg total) by mouth daily. 06/21/13   Noralee Space, MD  chlorpheniramine-HYDROcodone (TUSSIONEX PENNKINETIC ER) 10-8 MG/5ML LQCR Take 5 mLs by mouth every 12 (twelve) hours as needed for cough. 02/18/14   Noralee Space, MD  dicyclomine (BENTYL) 10 MG capsule Take 10 mg by mouth 3 (three) times daily as needed for spasms.    Historical Provider, MD  esomeprazole (NEXIUM) 40  MG capsule Take 1 capsule (40 mg total) by mouth 2 (two) times daily. 11/14/13   Noralee Space, MD  ferrous sulfate 325 (65 FE) MG tablet Take 325 mg by mouth daily with breakfast.    Historical Provider, MD  fluticasone (FLONASE) 50 MCG/ACT nasal spray Place 2 sprays into the nose at bedtime. 02/14/13   Noralee Space, MD  Fluticasone-Salmeterol (ADVAIR) [phone]  MCG/DOSE AEPB Inhale 1 puff into the lungs 2 (two) times daily. 03/22/14   Noralee Space, MD  guaiFENesin (MUCINEX) 600 MG 12 hr tablet Take 600 mg by mouth 2 (two) times daily.    Historical Provider, MD  hydrALAZINE (APRESOLINE) 25 MG tablet Take 25 mg by mouth 3 (three) times daily. 08/20/13   Noralee Space, MD  hydrOXYzine (ATARAX/VISTARIL) 25 MG tablet Take 25 mg by mouth every 4 (four) hours as needed for itching.    Historical Provider, MD  insulin detemir (LEVEMIR) 100 UNIT/ML injection Inject 50-54 Units into the skin 2 (two) times daily. CBG > 200 for 3 days titrate to 54 units. 04/10/13   Charlynne Cousins, MD  insulin lispro (HUMALOG KWIKPEN) 100 UNIT/ML SOPN Take 30 units in the morning, 40 units in the afternoon and 40 units in the evening.    Historical Provider, MD  isosorbide dinitrate (ISORDIL) 20 MG tablet Take 1 tablet (20 mg total) by mouth 3 (three) times daily. 08/20/13   Noralee Space, MD  ketorolac (ACULAR LS) 0.4 % SOLN Place 1 drop into both eyes 3 (three) times daily.     Historical Provider, MD  Lancets MISC 1 Units by Does not apply route 4 (four) times daily - after meals and at bedtime.    Historical Provider, MD  Linaclotide Rolan Lipa) 290 MCG CAPS capsule Take 290 mcg by mouth every other day. 06/14/13   Irene Shipper, MD  LORazepam (ATIVAN) 1 MG tablet Take 0.5-1 mg by mouth 3 (three) times daily as needed for anxiety.    Historical Provider, MD  metoCLOPramide (REGLAN) 10 MG tablet Take 1 tablet (10 mg total) by mouth at bedtime. 03/22/14   Noralee Space, MD  metoprolol tartrate (LOPRESSOR) 25 MG tablet Take 0.5  tablets (12.5 mg total) by mouth 2 (two) times daily. 05/01/13   Larey Dresser, MD  Multiple Vitamins-Minerals (MULTIVITAMIN WITH MINERALS) tablet Take 1 tablet by mouth daily.    Historical Provider, MD  ondansetron (ZOFRAN) 8 MG tablet Take 1 tablet (8 mg total) by mouth every 8 (eight) hours as needed for nausea. 10/14/13   Orlie Dakin, MD  oxyCODONE-acetaminophen (PERCOCET/ROXICET) 5-325 MG per tablet Take 1 tablet by mouth every 6 (six) hours as needed. 03/18/14   Ulyses Amor, PA-C  predniSONE (DELTASONE) 5 MG tablet Take 5 mg by mouth daily with breakfast.    Historical Provider, MD  pregabalin (LYRICA) 100 MG capsule Take 1 capsule (100 mg total) by mouth every evening. 11/14/13   Noralee Space, MD  Probiotic Product (PROBIOTIC PO) Take 1 capsule by mouth daily.    Historical Provider, MD  torsemide (DEMADEX) 20 MG tablet Take 2 tablets (40 mg total) by mouth daily. 05/03/13   Jolaine Artist, MD  traMADol (ULTRAM) 50 MG tablet Take 1 tablet (50 mg total) by mouth 3 (three) times daily as needed for moderate pain or severe pain. 02/18/14   Noralee Space, MD  triamcinolone cream (KENALOG) 0.1 % Apply 1 application topically 2 (two) times daily.    Historical Provider, MD   BP 123/89  Pulse 86  Temp(Src) 98.1 F (36.7 C) (Oral)  Resp 18  SpO2 99% Physical Exam  Constitutional: She is oriented to person, place, and time. She appears well-developed and well-nourished. No distress.  HENT:  Head: Normocephalic and atraumatic.  Right Ear: Hearing normal.  Left Ear: Hearing normal.  Nose: Nose normal.  Mouth/Throat: Oropharynx is clear and  moist and mucous membranes are normal.  Eyes: Conjunctivae and EOM are normal. Pupils are equal, round, and reactive to light.  Neck: Normal range of motion. Neck supple.  Cardiovascular: Regular rhythm, S1 normal and S2 normal.  Exam reveals no gallop and no friction rub.   No murmur heard. Pulmonary/Chest: Effort normal and breath sounds normal.  No respiratory distress. She exhibits no tenderness.  Abdominal: Soft. Normal appearance and bowel sounds are normal. There is no hepatosplenomegaly. There is no tenderness. There is no rebound, no guarding, no tenderness at McBurney's point and negative Murphy's sign. No hernia.  Musculoskeletal: Normal range of motion.  Neurological: She is alert and oriented to person, place, and time. She has normal strength. No cranial nerve deficit or sensory deficit. Coordination normal. GCS eye subscore is 4. GCS verbal subscore is 5. GCS motor subscore is 6.  Skin: Skin is warm, dry and intact. No rash noted. No cyanosis.  Psychiatric: She has a normal mood and affect. Her speech is normal and behavior is normal. Thought content normal.    ED Course  Procedures (including critical care time) Labs Review Labs Reviewed  CBC - Abnormal; Notable for the following:    WBC 15.1 (*)    Hemoglobin 11.7 (*)    Platelets 546 (*)    All other components within normal limits  I-STAT CHEM 8, ED - Abnormal; Notable for the following:    BUN 58 (*)    Creatinine, Ser 2.70 (*)    Glucose, Bld 29 (*)    Hemoglobin 23.1 (*)    HCT 68.0 (*)    All other components within normal limits  CBG MONITORING, ED - Abnormal; Notable for the following:    Glucose-Capillary 127 (*)    All other components within normal limits  COMPREHENSIVE METABOLIC PANEL  PRO B NATRIURETIC PEPTIDE  I-STAT TROPOININ, ED    Imaging Review Dg Chest 2 View  03/28/2014   CLINICAL DATA:  Cough and difficulty breathing  EXAM: CHEST  2 VIEW  COMPARISON:  October 14, 2013  FINDINGS: There is no edema or consolidation. Heart is mildly enlarged with pulmonary vascularity within normal limits. No adenopathy. There are surgical clips in the upper abdomen. There are no bone lesions.  IMPRESSION: No edema or consolidation.  Heart mildly enlarged.   Electronically Signed   By: Lowella Grip M.D.   On: 03/28/2014 15:31     EKG  Interpretation   Date/Time:  Thursday March 28 2014 14:50:00 EDT Ventricular Rate:  63 PR Interval:  146 QRS Duration: 84 QT Interval:  444 QTC Calculation: 454 R Axis:   74 Text Interpretation:  Normal sinus rhythm with sinus arrhythmia Low  voltage QRS Borderline ECG No significant change since last tracing  Confirmed by POLLINA  MD, [place] [phone]) on 03/28/2014 4:55:30 PM      MDM   Final diagnoses:  None   COPD exacerbation  Hypoglycemia  Patient presents to ER for evaluation of cough and shortness of breath. Symptoms ongoing for one week. Patient has a history of COPD. Symptoms appear to be secondary to COPD exacerbation. Chest x-ray does not show any pneumonia. She does not have any signs or symptoms of I. of overload. Cardiac workup is unrevealing, unchanged from previous. Patient's vital signs are essentially normal. She has mild hypertension consistent with her baseline. Oxygen saturation is 96-100% here. She is not tachycardic or febrile. Patient will be initiated on doxycycline empirically for COPD exacerbation. She'll continue bronchodilators. The  patient is prednisone dependent, takes 5 mg daily. She will be increased to 40 mg and taper slowly back to 5 mg.  Patient does report that she felt shaky while waiting to come back to the room in the ER. Blood had already been drawn at that point. Patient recognized the symptoms as hypoglycemia and took a sugar pill. She reports that she did not eat lunch today. Blood sugar was initially 29, but that was taken before she administered glucose. Recheck was 127. Patient given food to eat prior to discharge.   Orpah Greek, MD 03/28/14 1739

## 2014-03-28 NOTE — Discharge Instructions (Signed)
Chronic Obstructive Pulmonary Disease Exacerbation Chronic obstructive pulmonary disease (COPD) is a common lung condition in which airflow from the lungs is limited. COPD is a general term that can be used to describe many different lung problems that limit airflow, including chronic bronchitis and emphysema. COPD exacerbations are episodes when breathing symptoms become much worse and require extra treatment. Without treatment, COPD exacerbations can be life threatening, and frequent COPD exacerbations can cause further damage to your lungs. CAUSES   Respiratory infections.   Exposure to smoke.   Exposure to air pollution, chemical fumes, or dust. Sometimes there is no apparent cause or trigger. RISK FACTORS  Smoking cigarettes.  Older age.  Frequent prior COPD exacerbations. SIGNS AND SYMPTOMS   Increased coughing.   Increased thick spit (sputum) production.   Increased wheezing.   Increased shortness of breath.   Rapid breathing.   Chest tightness. DIAGNOSIS  Your medical history, a physical exam, and tests will help your health care provider make a diagnosis. Tests may include:  A chest X-ray.  Basic lab tests.  Sputum testing.  An arterial blood gas test. TREATMENT  Depending on the severity of your COPD exacerbation, you may need to be admitted to a hospital for treatment. Some of the treatments commonly used to treat COPD exacerbations are:   Antibiotic medicines.   Bronchodilators. These are drugs that expand the air passages. They may be given with an inhaler or nebulizer. Spacer devices may be needed to help improve drug delivery.  Corticosteroid medicines.  Supplemental oxygen therapy.  HOME CARE INSTRUCTIONS   Do not smoke. Quitting smoking is very important to prevent COPD from getting worse and exacerbations from happening as often.  Avoid exposure to all substances that irritate the airway, especially to tobacco smoke.   If you were  prescribed an antibiotic medicine, finish it all even if you start to feel better.  Take all medicines as directed by your health care provider.It is important to use correct technique with inhaled medicines.  Drink enough fluids to keep your urine clear or pale yellow (unless you have a medical condition that requires fluid restriction).  Use a cool mist vaporizer. This makes it easier to clear your chest when you cough.   If you have a home nebulizer and oxygen, continue to use them as directed.   Maintain all necessary vaccinations to prevent infections.   Exercise regularly.   Eat a healthy diet.   Keep all follow-up appointments as directed by your health care provider. SEEK IMMEDIATE MEDICAL CARE IF:  You have worsening shortness of breath.   You have trouble talking.   You have severe chest pain.  You have blood in your sputum.  You have a fever.  You have weakness, vomit repeatedly, or faint.   You feel confused.   You continue to get worse. MAKE SURE YOU:   Understand these instructions.  Will watch your condition.  Will get help right away if you are not doing well or get worse. Document Released: 05/16/2007 Document Revised: 12/03/2013 Document Reviewed: 03/23/2013 [facility] Patient Information 2015 [place], [place]. This information is not intended to replace advice given to you by your health care provider. Make sure you discuss any questions you have with your health care provider.  Hypoglycemia Hypoglycemia occurs when the glucose in your blood is too low. Glucose is a type of sugar that is your body's main energy source. Hormones, such as insulin and glucagon, control the level of glucose in the blood.  Insulin lowers blood glucose and glucagon increases blood glucose. Having too much insulin in your blood stream, or not eating enough food containing sugar, can result in hypoglycemia. Hypoglycemia can happen to people with or without diabetes. It  can develop quickly and can be a medical emergency.  CAUSES   Missing or delaying meals.  Not eating enough carbohydrates at meals.  Taking too much diabetes medicine.  Not timing your oral diabetes medicine or insulin doses with meals, snacks, and exercise.  Nausea and vomiting.  Certain medicines.  Severe illnesses, such as hepatitis, kidney disorders, and certain eating disorders.  Increased activity or exercise without eating something extra or adjusting medicines.  Drinking too much alcohol.  A nerve disorder that affects body functions like your heart rate, blood pressure, and digestion (autonomic neuropathy).  A condition where the stomach muscles do not function properly (gastroparesis). Therefore, medicines and food may not absorb properly.  Rarely, a tumor of the pancreas can produce too much insulin. SYMPTOMS   Hunger.  Sweating (diaphoresis).  Change in body temperature.  Shakiness.  Headache.  Anxiety.  Lightheadedness.  Irritability.  Difficulty concentrating.  Dry mouth.  Tingling or numbness in the hands or feet.  Restless sleep or sleep disturbances.  Altered speech and coordination.  Change in mental status.  Seizures or prolonged convulsions.  Combativeness.  Drowsiness (lethargic).  Weakness.  Increased heart rate or palpitations.  Confusion.  Pale, gray skin color.  Blurred or double vision.  Fainting. DIAGNOSIS  A physical exam and medical history will be performed. Your caregiver may make a diagnosis based on your symptoms. Blood tests and other lab tests may be performed to confirm a diagnosis. Once the diagnosis is made, your caregiver will see if your signs and symptoms go away once your blood glucose is raised.  TREATMENT  Usually, you can easily treat your hypoglycemia when you notice symptoms.  Check your blood glucose. If it is less than 70 mg/dl, take one of the following:   3-4 glucose tablets.    cup  juice.    cup regular soda.   1 cup skim milk.   -1 tube of glucose gel.   5-6 hard candies.   Avoid high-fat drinks or food that may delay a rise in blood glucose levels.  Do not take more than the recommended amount of sugary foods, drinks, gel, or tablets. Doing so will cause your blood glucose to go too high.   Wait 10-15 minutes and recheck your blood glucose. If it is still less than 70 mg/dl or below your target range, repeat treatment.   Eat a snack if it is more than 1 hour until your next meal.  There may be a time when your blood glucose may go so low that you are unable to treat yourself at home when you start to notice symptoms. You may need someone to help you. You may even faint or be unable to swallow. If you cannot treat yourself, someone will need to bring you to the hospital.  [place]  If you have diabetes, follow your diabetes management plan by:  Taking your medicines as directed.  Following your exercise plan.  Following your meal plan. Do not skip meals. Eat on time.  Testing your blood glucose regularly. Check your blood glucose before and after exercise. If you exercise longer or different than usual, be sure to check blood glucose more frequently.  Wearing your medical alert jewelry that says you have diabetes.  Identify the cause of your hypoglycemia. Then, develop ways to prevent the recurrence of hypoglycemia.  Do not take a hot bath or shower right after an insulin shot.  Always carry treatment with you. Glucose tablets are the easiest to carry.  If you are going to drink alcohol, drink it only with meals.  Tell friends or family members ways to keep you safe during a seizure. This may include removing hard or sharp objects from the area or turning you on your side.  Maintain a healthy weight. SEEK MEDICAL CARE IF:   You are having problems keeping your blood glucose in your target range.  You are having frequent  episodes of hypoglycemia.  You feel you might be having side effects from your medicines.  You are not sure why your blood glucose is dropping so low.  You notice a change in vision or a new problem with your vision. SEEK IMMEDIATE MEDICAL CARE IF:   Confusion develops.  A change in mental status occurs.  The inability to swallow develops.  Fainting occurs. Document Released: 07/19/2005 Document Revised: 07/24/2013 Document Reviewed: 11/15/2011 [facility] Patient Information 2015 [place], [place]. This information is not intended to replace advice given to you by your health care provider. Make sure you discuss any questions you have with your health care provider.

## 2014-03-28 NOTE — ED Notes (Signed)
Pt complains of sob and cough since last week, not feeling well.

## 2014-03-29 ENCOUNTER — Telehealth: Payer: Self-pay | Admitting: Pulmonary Disease

## 2014-03-29 NOTE — Telephone Encounter (Signed)
Leigh- Do you know the status of this CMN?

## 2014-03-29 NOTE — Telephone Encounter (Signed)
CMN has been signed by SN and given back to Alida.

## 2014-03-29 NOTE — Telephone Encounter (Signed)
CMN has been placed on SN cart to be signed.  Will give back to Alida once completed.

## 2014-04-01 LAB — I-STAT CHEM 8, ED
BUN: 58 mg/dL — ABNORMAL HIGH (ref 6–23)
Calcium, Ion: 1.25 mmol/L (ref 1.13–1.30)
Chloride: 103 mEq/L (ref 96–112)
Creatinine, Ser: 2.7 mg/dL — ABNORMAL HIGH (ref 0.50–1.10)
Glucose, Bld: 29 mg/dL — CL (ref 70–99)
HEMATOCRIT: 68 % — AB (ref 36.0–46.0)
HEMOGLOBIN: 23.1 g/dL — AB (ref 12.0–15.0)
POTASSIUM: 3.7 meq/L (ref 3.7–5.3)
SODIUM: 141 meq/L (ref 137–147)
TCO2: 27 mmol/L (ref 0–100)

## 2014-04-03 ENCOUNTER — Encounter: Payer: Self-pay | Admitting: Vascular Surgery

## 2014-04-04 ENCOUNTER — Encounter: Payer: Self-pay | Admitting: Vascular Surgery

## 2014-04-04 ENCOUNTER — Encounter: Payer: Medicare Other | Admitting: Vascular Surgery

## 2014-04-04 ENCOUNTER — Ambulatory Visit (INDEPENDENT_AMBULATORY_CARE_PROVIDER_SITE_OTHER): Payer: Medicare Other | Admitting: Vascular Surgery

## 2014-04-04 VITALS — BP 167/58 | HR 72 | Temp 98.2°F | Resp 18 | Ht 62.0 in | Wt 221.0 lb

## 2014-04-04 DIAGNOSIS — T82598A Other mechanical complication of other cardiac and vascular devices and implants, initial encounter: Secondary | ICD-10-CM

## 2014-04-04 DIAGNOSIS — N186 End stage renal disease: Secondary | ICD-10-CM

## 2014-04-04 DIAGNOSIS — Z4931 Encounter for adequacy testing for hemodialysis: Secondary | ICD-10-CM

## 2014-04-04 NOTE — Progress Notes (Signed)
Patient returns for followup today. She recently had ligation of a non-maturing left radiocephalic AV fistula and placement of a new left brachiocephalic AV fistula on August 17. She states the numbness and tingling in her left hand is intermittent but improved. She is currently not on dialysis. Her incisions have healed and no drainage.  Physical exam:  Filed Vitals:   04/04/14 1030  BP: 167/58  Pulse: 72  Temp: 98.2 F (36.8 C)  TempSrc: Oral  Resp: 18  Height: 5\' 2"  (1.575 m)  Weight: 221 lb (100.245 kg)  SpO2: 99%    Left upper extremity: Healed radiocephalic incision no audible bruit in the left forearm, healed antecubital incision palpable thrill and audible bruit left upper arm although the fistula is not easily visible on the skin surface.  Obese upper arm  Assessment: Patent left brachiocephalic AV fistula. However, this may be deep.  Plan: The patient will exercise the fistula for the next 2 months. She'll then return in followup for an ultrasound of the fistula. We will make a determination at that point whether or not the fistula is ready for cannulation or whether we need to consider Superficialization.  Ruta Hinds, MD [facility] Office: [phone] Pager: [phone]

## 2014-04-04 NOTE — Addendum Note (Signed)
Addended by: Mena Goes on: 04/04/2014 01:31 PM   Modules accepted: Orders

## 2014-04-05 ENCOUNTER — Other Ambulatory Visit: Payer: Self-pay | Admitting: Pulmonary Disease

## 2014-04-05 ENCOUNTER — Telehealth: Payer: Self-pay

## 2014-04-05 NOTE — Telephone Encounter (Signed)
Sent prior authorization for Linzess to plan

## 2014-04-16 NOTE — Telephone Encounter (Signed)
Linzess approved ?

## 2014-04-26 ENCOUNTER — Encounter (HOSPITAL_COMMUNITY): Payer: Self-pay | Admitting: Emergency Medicine

## 2014-04-26 ENCOUNTER — Inpatient Hospital Stay (HOSPITAL_COMMUNITY)
Admission: EM | Admit: 2014-04-26 | Discharge: 2014-05-01 | DRG: 291 | Disposition: A | Discharge status: Home Health | Payer: Medicare Other | Attending: Internal Medicine | Admitting: Internal Medicine

## 2014-04-26 DIAGNOSIS — N058 Unspecified nephritic syndrome with other morphologic changes: Secondary | ICD-10-CM | POA: Diagnosis present

## 2014-04-26 DIAGNOSIS — I5033 Acute on chronic diastolic (congestive) heart failure: Secondary | ICD-10-CM

## 2014-04-26 DIAGNOSIS — R0609 Other forms of dyspnea: Secondary | ICD-10-CM | POA: Diagnosis not present

## 2014-04-26 DIAGNOSIS — Z794 Long term (current) use of insulin: Secondary | ICD-10-CM

## 2014-04-26 DIAGNOSIS — I2510 Atherosclerotic heart disease of native coronary artery without angina pectoris: Secondary | ICD-10-CM | POA: Diagnosis present

## 2014-04-26 DIAGNOSIS — N189 Chronic kidney disease, unspecified: Secondary | ICD-10-CM

## 2014-04-26 DIAGNOSIS — E662 Morbid (severe) obesity with alveolar hypoventilation: Secondary | ICD-10-CM | POA: Diagnosis present

## 2014-04-26 DIAGNOSIS — IMO0001 Reserved for inherently not codable concepts without codable children: Secondary | ICD-10-CM | POA: Diagnosis not present

## 2014-04-26 DIAGNOSIS — G4733 Obstructive sleep apnea (adult) (pediatric): Secondary | ICD-10-CM

## 2014-04-26 DIAGNOSIS — J9601 Acute respiratory failure with hypoxia: Secondary | ICD-10-CM

## 2014-04-26 DIAGNOSIS — N186 End stage renal disease: Secondary | ICD-10-CM | POA: Diagnosis present

## 2014-04-26 DIAGNOSIS — H269 Unspecified cataract: Secondary | ICD-10-CM | POA: Diagnosis present

## 2014-04-26 DIAGNOSIS — F411 Generalized anxiety disorder: Secondary | ICD-10-CM | POA: Diagnosis present

## 2014-04-26 DIAGNOSIS — J209 Acute bronchitis, unspecified: Secondary | ICD-10-CM

## 2014-04-26 DIAGNOSIS — Z9981 Dependence on supplemental oxygen: Secondary | ICD-10-CM

## 2014-04-26 DIAGNOSIS — K2100 Gastro-esophageal reflux disease with esophagitis, without bleeding: Secondary | ICD-10-CM

## 2014-04-26 DIAGNOSIS — J45909 Unspecified asthma, uncomplicated: Secondary | ICD-10-CM | POA: Diagnosis present

## 2014-04-26 DIAGNOSIS — Z79899 Other long term (current) drug therapy: Secondary | ICD-10-CM

## 2014-04-26 DIAGNOSIS — J9620 Acute and chronic respiratory failure, unspecified whether with hypoxia or hypercapnia: Secondary | ICD-10-CM | POA: Diagnosis present

## 2014-04-26 DIAGNOSIS — Z7982 Long term (current) use of aspirin: Secondary | ICD-10-CM

## 2014-04-26 DIAGNOSIS — E1129 Type 2 diabetes mellitus with other diabetic kidney complication: Secondary | ICD-10-CM | POA: Diagnosis present

## 2014-04-26 DIAGNOSIS — D869 Sarcoidosis, unspecified: Secondary | ICD-10-CM | POA: Diagnosis present

## 2014-04-26 DIAGNOSIS — E785 Hyperlipidemia, unspecified: Secondary | ICD-10-CM | POA: Diagnosis present

## 2014-04-26 DIAGNOSIS — Z6841 Body Mass Index (BMI) 40.0 and over, adult: Secondary | ICD-10-CM

## 2014-04-26 DIAGNOSIS — E1121 Type 2 diabetes mellitus with diabetic nephropathy: Secondary | ICD-10-CM

## 2014-04-26 DIAGNOSIS — J449 Chronic obstructive pulmonary disease, unspecified: Secondary | ICD-10-CM | POA: Diagnosis present

## 2014-04-26 DIAGNOSIS — M109 Gout, unspecified: Secondary | ICD-10-CM | POA: Diagnosis present

## 2014-04-26 DIAGNOSIS — IMO0002 Reserved for concepts with insufficient information to code with codable children: Secondary | ICD-10-CM

## 2014-04-26 DIAGNOSIS — J4489 Other specified chronic obstructive pulmonary disease: Secondary | ICD-10-CM | POA: Diagnosis present

## 2014-04-26 DIAGNOSIS — Z96659 Presence of unspecified artificial knee joint: Secondary | ICD-10-CM

## 2014-04-26 DIAGNOSIS — N179 Acute kidney failure, unspecified: Secondary | ICD-10-CM

## 2014-04-26 DIAGNOSIS — I10 Essential (primary) hypertension: Secondary | ICD-10-CM | POA: Diagnosis present

## 2014-04-26 DIAGNOSIS — I509 Heart failure, unspecified: Secondary | ICD-10-CM | POA: Diagnosis present

## 2014-04-26 DIAGNOSIS — K219 Gastro-esophageal reflux disease without esophagitis: Secondary | ICD-10-CM | POA: Diagnosis present

## 2014-04-26 DIAGNOSIS — Z23 Encounter for immunization: Secondary | ICD-10-CM

## 2014-04-26 HISTORY — DX: Chronic diastolic (congestive) heart failure: I50.32

## 2014-04-26 HISTORY — DX: Essential (primary) hypertension: I10

## 2014-04-26 HISTORY — DX: Chronic kidney disease, stage 4 (severe): N18.4

## 2014-04-26 HISTORY — DX: Atherosclerotic heart disease of native coronary artery without angina pectoris: I25.10

## 2014-04-26 NOTE — ED Notes (Signed)
The pt is feelikng sob for the past 2-3 days.  Her feet and ankles are swollen also.  She is on home 02 as needed.  No pain

## 2014-04-27 ENCOUNTER — Emergency Department (HOSPITAL_COMMUNITY): Payer: Medicare Other

## 2014-04-27 DIAGNOSIS — Z7982 Long term (current) use of aspirin: Secondary | ICD-10-CM | POA: Diagnosis not present

## 2014-04-27 DIAGNOSIS — E1129 Type 2 diabetes mellitus with other diabetic kidney complication: Secondary | ICD-10-CM

## 2014-04-27 DIAGNOSIS — J9620 Acute and chronic respiratory failure, unspecified whether with hypoxia or hypercapnia: Secondary | ICD-10-CM | POA: Diagnosis present

## 2014-04-27 DIAGNOSIS — D869 Sarcoidosis, unspecified: Secondary | ICD-10-CM | POA: Diagnosis present

## 2014-04-27 DIAGNOSIS — N179 Acute kidney failure, unspecified: Secondary | ICD-10-CM

## 2014-04-27 DIAGNOSIS — F411 Generalized anxiety disorder: Secondary | ICD-10-CM | POA: Diagnosis present

## 2014-04-27 DIAGNOSIS — N186 End stage renal disease: Secondary | ICD-10-CM | POA: Diagnosis present

## 2014-04-27 DIAGNOSIS — G4733 Obstructive sleep apnea (adult) (pediatric): Secondary | ICD-10-CM | POA: Diagnosis present

## 2014-04-27 DIAGNOSIS — K219 Gastro-esophageal reflux disease without esophagitis: Secondary | ICD-10-CM | POA: Diagnosis present

## 2014-04-27 DIAGNOSIS — I5033 Acute on chronic diastolic (congestive) heart failure: Secondary | ICD-10-CM

## 2014-04-27 DIAGNOSIS — Z794 Long term (current) use of insulin: Secondary | ICD-10-CM | POA: Diagnosis not present

## 2014-04-27 DIAGNOSIS — I2510 Atherosclerotic heart disease of native coronary artery without angina pectoris: Secondary | ICD-10-CM | POA: Diagnosis present

## 2014-04-27 DIAGNOSIS — I379 Nonrheumatic pulmonary valve disorder, unspecified: Secondary | ICD-10-CM

## 2014-04-27 DIAGNOSIS — J209 Acute bronchitis, unspecified: Secondary | ICD-10-CM | POA: Diagnosis present

## 2014-04-27 DIAGNOSIS — J449 Chronic obstructive pulmonary disease, unspecified: Secondary | ICD-10-CM | POA: Diagnosis present

## 2014-04-27 DIAGNOSIS — IMO0002 Reserved for concepts with insufficient information to code with codable children: Secondary | ICD-10-CM | POA: Diagnosis not present

## 2014-04-27 DIAGNOSIS — M109 Gout, unspecified: Secondary | ICD-10-CM | POA: Diagnosis present

## 2014-04-27 DIAGNOSIS — Z79899 Other long term (current) drug therapy: Secondary | ICD-10-CM | POA: Diagnosis not present

## 2014-04-27 DIAGNOSIS — E785 Hyperlipidemia, unspecified: Secondary | ICD-10-CM | POA: Diagnosis present

## 2014-04-27 DIAGNOSIS — N058 Unspecified nephritic syndrome with other morphologic changes: Secondary | ICD-10-CM | POA: Diagnosis present

## 2014-04-27 DIAGNOSIS — Z6841 Body Mass Index (BMI) 40.0 and over, adult: Secondary | ICD-10-CM | POA: Diagnosis not present

## 2014-04-27 DIAGNOSIS — Z23 Encounter for immunization: Secondary | ICD-10-CM | POA: Diagnosis not present

## 2014-04-27 DIAGNOSIS — Z9981 Dependence on supplemental oxygen: Secondary | ICD-10-CM | POA: Diagnosis not present

## 2014-04-27 DIAGNOSIS — I509 Heart failure, unspecified: Secondary | ICD-10-CM | POA: Insufficient documentation

## 2014-04-27 DIAGNOSIS — R0609 Other forms of dyspnea: Secondary | ICD-10-CM | POA: Diagnosis present

## 2014-04-27 DIAGNOSIS — K2100 Gastro-esophageal reflux disease with esophagitis, without bleeding: Secondary | ICD-10-CM

## 2014-04-27 DIAGNOSIS — IMO0001 Reserved for inherently not codable concepts without codable children: Secondary | ICD-10-CM | POA: Diagnosis present

## 2014-04-27 DIAGNOSIS — N189 Chronic kidney disease, unspecified: Secondary | ICD-10-CM

## 2014-04-27 DIAGNOSIS — E662 Morbid (severe) obesity with alveolar hypoventilation: Secondary | ICD-10-CM | POA: Diagnosis present

## 2014-04-27 DIAGNOSIS — H269 Unspecified cataract: Secondary | ICD-10-CM | POA: Diagnosis present

## 2014-04-27 DIAGNOSIS — Z96659 Presence of unspecified artificial knee joint: Secondary | ICD-10-CM | POA: Diagnosis not present

## 2014-04-27 LAB — CBC
HCT: 33.2 % — ABNORMAL LOW (ref 36.0–46.0)
HEMOGLOBIN: 10.5 g/dL — AB (ref 12.0–15.0)
MCH: 28.7 pg (ref 26.0–34.0)
MCHC: 31.6 g/dL (ref 30.0–36.0)
MCV: 90.7 fL (ref 78.0–100.0)
Platelets: 483 10*3/uL — ABNORMAL HIGH (ref 150–400)
RBC: 3.66 MIL/uL — AB (ref 3.87–5.11)
RDW: 14.7 % (ref 11.5–15.5)
WBC: 12.1 10*3/uL — ABNORMAL HIGH (ref 4.0–10.5)

## 2014-04-27 LAB — GLUCOSE, CAPILLARY
GLUCOSE-CAPILLARY: 328 mg/dL — AB (ref 70–99)
GLUCOSE-CAPILLARY: 285 mg/dL — AB (ref 70–99)
Glucose-Capillary: 247 mg/dL — ABNORMAL HIGH (ref 70–99)
Glucose-Capillary: 256 mg/dL — ABNORMAL HIGH (ref 70–99)
Glucose-Capillary: 240 mg/dL — ABNORMAL HIGH (ref 70–99)

## 2014-04-27 LAB — PRO B NATRIURETIC PEPTIDE: PRO B NATRI PEPTIDE: 3725 pg/mL — AB (ref 0–125)

## 2014-04-27 LAB — BASIC METABOLIC PANEL
Anion gap: 14 (ref 5–15)
Anion gap: 16 — ABNORMAL HIGH (ref 5–15)
BUN: 59 mg/dL — ABNORMAL HIGH (ref 6–23)
BUN: 60 mg/dL — AB (ref 6–23)
CHLORIDE: 102 meq/L (ref 96–112)
CHLORIDE: 99 meq/L (ref 96–112)
CO2: 23 meq/L (ref 19–32)
CO2: 24 mEq/L (ref 19–32)
CREATININE: 3.06 mg/dL — AB (ref 0.50–1.10)
Calcium: 9 mg/dL (ref 8.4–10.5)
Calcium: 9.1 mg/dL (ref 8.4–10.5)
Creatinine, Ser: 3.06 mg/dL — ABNORMAL HIGH (ref 0.50–1.10)
GFR calc Af Amer: 17 mL/min — ABNORMAL LOW (ref >90)
GFR calc non Af Amer: 15 mL/min — ABNORMAL LOW (ref >90)
GFR calc non Af Amer: 15 mL/min — ABNORMAL LOW (ref >90)
GFR, EST AFRICAN AMERICAN: 17 mL/min — AB (ref >90)
GLUCOSE: 187 mg/dL — AB (ref 70–99)
GLUCOSE: 228 mg/dL — AB (ref 70–99)
Potassium: 4 mEq/L (ref 3.7–5.3)
Potassium: 4.2 mEq/L (ref 3.7–5.3)
SODIUM: 138 meq/L (ref 137–147)
Sodium: 140 mEq/L (ref 137–147)

## 2014-04-27 LAB — MAGNESIUM: Magnesium: 1.5 mg/dL (ref 1.5–2.5)

## 2014-04-27 LAB — TROPONIN I
Troponin I: <0.3 ng/mL (ref <0.30)
Troponin I: <0.3 ng/mL (ref <0.30)

## 2014-04-27 MED ORDER — FUROSEMIDE 10 MG/ML IJ SOLN
120.0000 mg | Freq: Four times a day (QID) | INTRAVENOUS | Status: DC
  Filled 2014-04-27 (×4): qty 12

## 2014-04-27 MED ORDER — PREDNISONE 5 MG PO TABS
5.0000 mg | ORAL_TABLET | Freq: Every day | ORAL | Status: DC
  Administered 2014-04-27 – 2014-05-01 (×5): 5 mg via ORAL
  Filled 2014-04-27 (×6): qty 1

## 2014-04-27 MED ORDER — IPRATROPIUM BROMIDE 0.02 % IN SOLN
0.5000 mg | Freq: Two times a day (BID) | RESPIRATORY_TRACT | Status: DC
  Administered 2014-04-27 – 2014-04-29 (×5): 0.5 mg via RESPIRATORY_TRACT
  Filled 2014-04-27 (×5): qty 2.5

## 2014-04-27 MED ORDER — ALBUTEROL SULFATE (2.5 MG/3ML) 0.083% IN NEBU
2.5000 mg | INHALATION_SOLUTION | Freq: Two times a day (BID) | RESPIRATORY_TRACT | Status: DC
  Administered 2014-04-27 – 2014-04-29 (×5): 2.5 mg via RESPIRATORY_TRACT
  Filled 2014-04-27 (×5): qty 3

## 2014-04-27 MED ORDER — LINACLOTIDE 290 MCG PO CAPS
290.0000 ug | ORAL_CAPSULE | ORAL | Status: DC
  Administered 2014-04-27 – 2014-05-01 (×3): 290 ug via ORAL
  Filled 2014-04-27 (×3): qty 1

## 2014-04-27 MED ORDER — PANTOPRAZOLE SODIUM 40 MG PO TBEC
40.0000 mg | DELAYED_RELEASE_TABLET | Freq: Every day | ORAL | Status: DC
  Administered 2014-04-27 – 2014-05-01 (×5): 40 mg via ORAL
  Filled 2014-04-27 (×5): qty 1

## 2014-04-27 MED ORDER — METOLAZONE 2.5 MG PO TABS
2.5000 mg | ORAL_TABLET | Freq: Every day | ORAL | Status: DC
  Administered 2014-04-27: 2.5 mg via ORAL
  Filled 2014-04-27: qty 1

## 2014-04-27 MED ORDER — INSULIN ASPART 100 UNIT/ML ~~LOC~~ SOLN
0.0000 [IU] | SUBCUTANEOUS | Status: DC
  Administered 2014-04-27 (×2): 8 [IU] via SUBCUTANEOUS
  Administered 2014-04-27 (×2): 5 [IU] via SUBCUTANEOUS
  Administered 2014-04-27: 15 [IU] via SUBCUTANEOUS
  Administered 2014-04-28 (×2): 8 [IU] via SUBCUTANEOUS
  Administered 2014-04-28: 3 [IU] via SUBCUTANEOUS
  Administered 2014-04-28: 8 [IU] via SUBCUTANEOUS
  Administered 2014-04-28: 3 [IU] via SUBCUTANEOUS
  Administered 2014-04-29: 8 [IU] via SUBCUTANEOUS
  Administered 2014-04-29: 2 [IU] via SUBCUTANEOUS
  Administered 2014-04-29 (×2): 5 [IU] via SUBCUTANEOUS
  Administered 2014-04-30: 11 [IU] via SUBCUTANEOUS
  Administered 2014-04-30: 8 [IU] via SUBCUTANEOUS
  Administered 2014-04-30: 2 [IU] via SUBCUTANEOUS
  Administered 2014-04-30: 5 [IU] via SUBCUTANEOUS
  Administered 2014-05-01: 3 [IU] via SUBCUTANEOUS
  Administered 2014-05-01: 8 [IU] via SUBCUTANEOUS

## 2014-04-27 MED ORDER — ALBUTEROL SULFATE (2.5 MG/3ML) 0.083% IN NEBU: 2.5000 mg | INHALATION_SOLUTION | Freq: Two times a day (BID) | RESPIRATORY_TRACT | Status: DC

## 2014-04-27 MED ORDER — METOPROLOL TARTRATE 12.5 MG HALF TABLET
12.5000 mg | ORAL_TABLET | Freq: Two times a day (BID) | ORAL | Status: DC
  Administered 2014-04-27 – 2014-05-01 (×9): 12.5 mg via ORAL
  Filled 2014-04-27 (×11): qty 1

## 2014-04-27 MED ORDER — GUAIFENESIN ER 600 MG PO TB12
600.0000 mg | ORAL_TABLET | Freq: Two times a day (BID) | ORAL | Status: DC | PRN
  Filled 2014-04-27 (×2): qty 1

## 2014-04-27 MED ORDER — CLONIDINE HCL 0.1 MG PO TABS
0.1000 mg | ORAL_TABLET | Freq: Two times a day (BID) | ORAL | Status: DC
  Administered 2014-04-27 – 2014-05-01 (×9): 0.1 mg via ORAL
  Filled 2014-04-27 (×11): qty 1

## 2014-04-27 MED ORDER — FLUTICASONE PROPIONATE 50 MCG/ACT NA SUSP
2.0000 | Freq: Every day | NASAL | Status: DC
  Administered 2014-04-27 – 2014-04-30 (×4): 2 via NASAL
  Filled 2014-04-27 (×2): qty 16

## 2014-04-27 MED ORDER — HYDRALAZINE HCL 10 MG PO TABS
10.0000 mg | ORAL_TABLET | Freq: Three times a day (TID) | ORAL | Status: DC
  Administered 2014-04-27 – 2014-05-01 (×13): 10 mg via ORAL
  Filled 2014-04-27 (×17): qty 1

## 2014-04-27 MED ORDER — SODIUM CHLORIDE 0.9 % IJ SOLN
3.0000 mL | INTRAMUSCULAR | Status: DC | PRN
  Administered 2014-04-30: 3 mL via INTRAVENOUS

## 2014-04-27 MED ORDER — LORAZEPAM 1 MG PO TABS: 1.0000 mg | ORAL_TABLET | Freq: Every evening | ORAL | Status: DC | PRN

## 2014-04-27 MED ORDER — METOCLOPRAMIDE HCL 10 MG PO TABS
10.0000 mg | ORAL_TABLET | Freq: Every day | ORAL | Status: DC
  Administered 2014-04-27 – 2014-04-30 (×4): 10 mg via ORAL
  Filled 2014-04-27 (×7): qty 1

## 2014-04-27 MED ORDER — ACETAMINOPHEN 500 MG PO TABS
500.0000 mg | ORAL_TABLET | Freq: Two times a day (BID) | ORAL | Status: DC | PRN
  Administered 2014-04-29 – 2014-04-30 (×3): 500 mg via ORAL
  Filled 2014-04-27 (×3): qty 1

## 2014-04-27 MED ORDER — IPRATROPIUM BROMIDE 0.02 % IN SOLN
0.5000 mg | RESPIRATORY_TRACT | Status: DC
  Administered 2014-04-27: 0.5 mg via RESPIRATORY_TRACT
  Filled 2014-04-27: qty 2.5

## 2014-04-27 MED ORDER — INSULIN GLARGINE 100 UNIT/ML ~~LOC~~ SOLN
40.0000 [IU] | Freq: Two times a day (BID) | SUBCUTANEOUS | Status: DC
  Administered 2014-04-27 – 2014-05-01 (×9): 40 [IU] via SUBCUTANEOUS
  Filled 2014-04-27 (×11): qty 0.4

## 2014-04-27 MED ORDER — KETOROLAC TROMETHAMINE 0.5 % OP SOLN
1.0000 [drp] | Freq: Three times a day (TID) | OPHTHALMIC | Status: DC
  Administered 2014-04-27 – 2014-05-01 (×13): 1 [drp] via OPHTHALMIC
  Filled 2014-04-27 (×2): qty 3

## 2014-04-27 MED ORDER — OXYCODONE-ACETAMINOPHEN 5-325 MG PO TABS
1.0000 | ORAL_TABLET | Freq: Every day | ORAL | Status: DC | PRN
  Administered 2014-04-28: 1 via ORAL
  Filled 2014-04-27: qty 1

## 2014-04-27 MED ORDER — SODIUM CHLORIDE 0.9 % IJ SOLN
3.0000 mL | Freq: Two times a day (BID) | INTRAMUSCULAR | Status: DC
  Administered 2014-04-28 – 2014-05-01 (×3): 3 mL via INTRAVENOUS

## 2014-04-27 MED ORDER — ASPIRIN EC 81 MG PO TBEC
81.0000 mg | DELAYED_RELEASE_TABLET | Freq: Every day | ORAL | Status: DC
  Administered 2014-04-27 – 2014-05-01 (×5): 81 mg via ORAL
  Filled 2014-04-27 (×5): qty 1

## 2014-04-27 MED ORDER — HEPARIN SODIUM (PORCINE) 5000 UNIT/ML IJ SOLN
5000.0000 [IU] | Freq: Three times a day (TID) | INTRAMUSCULAR | Status: DC
  Administered 2014-04-27 – 2014-05-01 (×13): 5000 [IU] via SUBCUTANEOUS
  Filled 2014-04-27 (×17): qty 1

## 2014-04-27 MED ORDER — FERROUS SULFATE 325 (65 FE) MG PO TABS
325.0000 mg | ORAL_TABLET | Freq: Every day | ORAL | Status: DC
  Administered 2014-04-27 – 2014-05-01 (×5): 325 mg via ORAL
  Filled 2014-04-27 (×6): qty 1

## 2014-04-27 MED ORDER — DICYCLOMINE HCL 10 MG PO CAPS
10.0000 mg | ORAL_CAPSULE | Freq: Three times a day (TID) | ORAL | Status: DC | PRN
  Filled 2014-04-27: qty 1

## 2014-04-27 MED ORDER — IPRATROPIUM BROMIDE 0.02 % IN SOLN
0.5000 mg | Freq: Two times a day (BID) | RESPIRATORY_TRACT | Status: DC
Start: 2014-04-27 — End: 2014-04-27

## 2014-04-27 MED ORDER — APREMILAST 30 MG PO TABS
30.0000 mg | ORAL_TABLET | Freq: Every day | ORAL | Status: DC
  Administered 2014-04-29 – 2014-05-01 (×3): 30 mg via ORAL
  Filled 2014-04-27 (×5): qty 1

## 2014-04-27 MED ORDER — BEANO PO LIQD: 1.0000 | Freq: Every day | ORAL | Status: DC

## 2014-04-27 MED ORDER — ISOSORBIDE DINITRATE 20 MG PO TABS
20.0000 mg | ORAL_TABLET | Freq: Three times a day (TID) | ORAL | Status: DC
  Administered 2014-04-27 – 2014-05-01 (×13): 20 mg via ORAL
  Filled 2014-04-27 (×16): qty 1

## 2014-04-27 MED ORDER — PNEUMOCOCCAL VAC POLYVALENT 25 MCG/0.5ML IJ INJ
0.5000 mL | INJECTION | INTRAMUSCULAR | Status: AC
  Administered 2014-04-28: 0.5 mL via INTRAMUSCULAR
  Filled 2014-04-27: qty 0.5

## 2014-04-27 MED ORDER — ALBUTEROL SULFATE (2.5 MG/3ML) 0.083% IN NEBU: 5.0000 mg | INHALATION_SOLUTION | Freq: Once | RESPIRATORY_TRACT | Status: DC

## 2014-04-27 MED ORDER — FUROSEMIDE 10 MG/ML IJ SOLN
10.0000 mg/h | INTRAVENOUS | Status: AC
  Administered 2014-04-27: 10 mg/h via INTRAVENOUS
  Filled 2014-04-27: qty 25

## 2014-04-27 MED ORDER — ATORVASTATIN CALCIUM 20 MG PO TABS
20.0000 mg | ORAL_TABLET | Freq: Every day | ORAL | Status: DC
  Administered 2014-04-27 – 2014-04-30 (×4): 20 mg via ORAL
  Filled 2014-04-27 (×6): qty 1

## 2014-04-27 MED ORDER — ALBUTEROL SULFATE (2.5 MG/3ML) 0.083% IN NEBU
5.0000 mg | INHALATION_SOLUTION | Freq: Once | RESPIRATORY_TRACT | Status: AC
Start: 2014-04-27 — End: 2014-04-27
  Administered 2014-04-27: 5 mg via RESPIRATORY_TRACT
  Filled 2014-04-27: qty 6

## 2014-04-27 MED ORDER — TRAMADOL HCL 50 MG PO TABS: 50.0000 mg | ORAL_TABLET | Freq: Three times a day (TID) | ORAL | Status: DC | PRN

## 2014-04-27 MED ORDER — SODIUM CHLORIDE 0.9 % IV SOLN: 250.0000 mL | INTRAVENOUS | Status: DC | PRN

## 2014-04-27 MED ORDER — INFLUENZA VAC SPLIT QUAD 0.5 ML IM SUSY
0.5000 mL | PREFILLED_SYRINGE | INTRAMUSCULAR | Status: AC
  Administered 2014-04-28: 0.5 mL via INTRAMUSCULAR
  Filled 2014-04-27: qty 0.5

## 2014-04-27 MED ORDER — HYDROXYZINE HCL 25 MG PO TABS
25.0000 mg | ORAL_TABLET | ORAL | Status: DC | PRN
  Filled 2014-04-27: qty 1

## 2014-04-27 MED ORDER — ALBUTEROL SULFATE (2.5 MG/3ML) 0.083% IN NEBU: 2.5000 mg | INHALATION_SOLUTION | RESPIRATORY_TRACT | Status: DC | PRN

## 2014-04-27 MED ORDER — FUROSEMIDE 10 MG/ML IJ SOLN
120.0000 mg | Freq: Two times a day (BID) | INTRAVENOUS | Status: DC
  Filled 2014-04-27 (×2): qty 12

## 2014-04-27 MED ORDER — RISAQUAD PO CAPS
1.0000 | ORAL_CAPSULE | Freq: Every day | ORAL | Status: DC
  Administered 2014-04-27 – 2014-05-01 (×5): 1 via ORAL
  Filled 2014-04-27 (×5): qty 1

## 2014-04-27 MED ORDER — ALBUTEROL SULFATE (2.5 MG/3ML) 0.083% IN NEBU
2.5000 mg | INHALATION_SOLUTION | RESPIRATORY_TRACT | Status: DC
  Administered 2014-04-27: 2.5 mg via RESPIRATORY_TRACT
  Filled 2014-04-27: qty 3

## 2014-04-27 MED ORDER — ADULT MULTIVITAMIN W/MINERALS CH
1.0000 | ORAL_TABLET | Freq: Every day | ORAL | Status: DC
  Administered 2014-04-27 – 2014-04-29 (×3): 1 via ORAL
  Filled 2014-04-27 (×3): qty 1

## 2014-04-27 MED ORDER — PREGABALIN 100 MG PO CAPS
100.0000 mg | ORAL_CAPSULE | Freq: Every evening | ORAL | Status: DC
  Administered 2014-04-27 – 2014-04-30 (×4): 100 mg via ORAL
  Filled 2014-04-27 (×4): qty 1

## 2014-04-27 MED ORDER — TRIAMCINOLONE ACETONIDE 0.1 % EX OINT
1.0000 | TOPICAL_OINTMENT | Freq: Two times a day (BID) | CUTANEOUS | Status: DC | PRN
Start: 2014-04-27 — End: 2014-05-01

## 2014-04-27 MED ORDER — SODIUM CHLORIDE 0.9 % IJ SOLN
3.0000 mL | Freq: Two times a day (BID) | INTRAMUSCULAR | Status: DC
  Administered 2014-04-27 – 2014-05-01 (×6): 3 mL via INTRAVENOUS

## 2014-04-27 NOTE — ED Notes (Signed)
Pt to xray, no changes, alert, NAD, calm, interactive, family at [facility].

## 2014-04-27 NOTE — Progress Notes (Addendum)
Patient admitted over night for dyspnea and increasing pedal edema. She had doubled he Torsemide from 40 daily to BID at home but despite this remained fluid overloaded. Admitted for CHF exacerbation  Principal Problem:   CHF exacerbation- diastolic CHF - repeating ECHO - chang lasix to  Infusion - follow I and O and potassium levels carefully  Active Problems:   HYPERTENSION - cont Metoprolol, Clonidine, Imdur    CAD - cont ASA 81 mg and B Blocker    ASTHMA - stable    Sarcoidosis - patient states she is on 5 mg of Prednisone daily for this (may be for COPD)    COPD (chronic obstructive pulmonary disease) - stable- on 2 L O2 at home continuously   OSA - will order CPAP    Diabetes mellitus with renal manifestation - cont Lantus 40 BID, and insulin sliding scale    End stage renal disease - follow Cr with diuresis  Debbe Odea, MD

## 2014-04-27 NOTE — H&P (Addendum)
[facility] History and Physical  [name] File LFY:[id number] DOB: 1946/06/09 DOA: 04/26/2014  Referring physician: ED physician PCP: Antony Blackbird, MD  Specialists:   Chief Complaint: Worsening Leg edema and shortness of breath  HPI: [name] is a 68 y.o. female with past medical history of asthma, COPD, sarcoidosis, diabetes type 2, CAD, ESRD, OSA, diastolic congestive heart failure, who presents with worsening leg edema and shortness of breath.   Patient had COPD and asthma. She uses 2 L of oxygen at home at her normal baseline. The patient reports that she has been doing fine until 2 weeks ago when she started having sob. She has mild cough with white colored sputum production. She does not have fever, chills, wheezing or chest pain. She also noticed worsening bilateral leg edema. Her body weight increased by more than 12 pounds. She used to take torsemide 40 mg daily, which was increased to 40 mg twice a day without significant help. Her leg edema and shortness of breath has been progressively getting worse. Therefore she comes to ED for further evaluation and treatment. She denies other symptoms, such as nausea, vomiting. She has chronic mild abdominal pain and diarrhea due to IBS, which has not changed in nature. She reports mild burning on urination, and urinary frequency, but no dysuria. Patient does not have tenderness over her calf area. She was found to have elevated pro BNP at 3725. She is admitted to inpatient for further evaluation and treatment.   Review of Systems: As presented in the history of presenting illness, rest negative.  Where does patient live? Lives alone at home in [place].   Can patient participate in ADLs? Yes  Allergy:  Allergies  Allergen Reactions  . Adhesive [Tape] Other (See Comments)    Blisters   . Avelox [Moxifloxacin Hcl In Nacl] Other (See Comments)    GI upset  . Codeine Other (See Comments)    "crazy"   . Guaifenesin  Nausea And Vomiting  . Latex Swelling  . Oxycodone Nausea And Vomiting    Past Medical History  Diagnosis Date  . Unspecified essential hypertension   . CAD (coronary artery disease) 01/04/2006    Tiny OM1 70-90% ostial stenosis, no other CAD  . Asthma   . Esophageal reflux   . Obesity, unspecified   . Anxiety state, unspecified   . Hypercalcemia   . Anemia   . Other psoriasis   . Diverticulosis   . Sarcoidosis 2012  . Dyslipidemia   . COPD (chronic obstructive pulmonary disease)   . Gout   . Colon polyps     Tubular adenomatous polyps  . Spinal stenosis of lumbar region   . Bulging lumbar disc   . CHF (congestive heart failure)   . PNA (pneumonia) 2012    With pleural effusion, requiring thoracentesis  . Pneumonia     "several times" (04/02/2013)  . Pleural effusion 2012  . Chronic bronchitis   . Exertional shortness of breath     "and lying flat" (04/02/2013)  . On home oxygen therapy     "2L when I'm up; 3L when I'm at sleep" (04/02/2013)  . OSA (obstructive sleep apnea)     marginally compliant with CPAP  . Type II diabetes mellitus   . Degenerative joint disease   . Arthritis     'all over" (04/02/2013)  . Chronic lower back pain   . Diverticulosis   . GERD (gastroesophageal reflux disease)   . Anxiety   .  Hyperlipidemia   . Sarcoidosis of lung   . Kidney disease     baseline creatinie 2.0,  folowed by Dr Hassell Done  . Renal failure     "kidney's are working @ 50%" (04/02/2013)  . Renal insufficiency     2015 now Stage 4  . IBS (irritable bowel syndrome)   . Cataracts, both eyes   . Dysrhythmia   . YQMVHQIO(962.9)     Past Surgical History  Procedure Laterality Date  . Vesicovaginal fistula closure w/  total abdominal hysterectomy  1992  . Bilateral total knee replacements Bilateral Rt=5/04 & Lft=1/09    by DrAlusio  . 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 Zella Richer  . Appendectomy  1957  . Tonsillectomy  1968  . Cholecystectomy  1980's  . Abdominal hysterectomy   1992  . Reduction mammaplasty Bilateral 1980's  . Cardiac catheterization  1990's  . Thoracentesis  2012  . Joint replacement    . Av fistula placement Left 12/31/2013    Procedure: ARTERIOVENOUS (AV) FISTULA CREATION;  Surgeon: Elam Dutch, MD;  Location: [facility];  Service: Vascular;  Laterality: Left;  . Colonoscopy w/ biopsies and polypectomy    . Av fistula placement Left 03/18/2014    Procedure: ARTERIOVENOUS (AV) FISTULA CREATION- LEFT BRACHIOCEPHALIC ;  Surgeon: Elam Dutch, MD;  Location: [facility] OR;  Service: Vascular;  Laterality: Left;  . Ligation of arteriovenous  fistula Left 03/18/2014    Procedure: LIGATION OF ARTERIOVENOUS  FISTULA- LEFT RADIOCEPHALIC;  Surgeon: Elam Dutch, MD;  Location: [facility];  Service: Vascular;  Laterality: Left;    Social History:  reports that she has never smoked. She has never used smokeless tobacco. She reports that she does not drink alcohol or use illicit drugs.  Family History:  Family History  Problem Relation Age of Onset  . Diabetes Mother   . Heart disease Mother   . Hyperlipidemia Mother   . Varicose Veins Mother   . Breast cancer Maternal Aunt   . Heart disease Father   . Deep vein thrombosis Father   . Hyperlipidemia Father   . Heart disease Sister     before age 22  . Cancer Sister   . Diabetes Sister   . Hyperlipidemia Sister   . Heart attack Sister   . Heart disease Brother   . Diabetes Brother   . Hyperlipidemia Brother   . Heart attack Brother   . Hyperlipidemia Sister      Prior to Admission medications   Medication Sig Start Date End Date Taking? Authorizing Provider  acetaminophen (TYLENOL) 500 MG tablet Take 500 mg by mouth 2 (two) times daily as needed (pain).    Yes Historical Provider, MD  albuterol (PROVENTIL HFA;VENTOLIN HFA) 108 (90 BASE) MCG/ACT inhaler Inhale 2 puffs into the lungs every 4 (four) hours as needed for wheezing or shortness of breath.    Yes Historical Provider, MD  albuterol  (PROVENTIL) (2.5 MG/3ML) 0.083% nebulizer solution Take 2.5 mg by nebulization every 2 (two) hours as needed for wheezing or shortness of breath.   Yes Historical Provider, MD  Alpha-D-Galactosidase (BEANO PO) Take 1 tablet by mouth daily.   Yes Historical Provider, MD  Apremilast (OTEZLA) 30 MG TABS Take 30 mg by mouth daily.    Yes Historical Provider, MD  aspirin EC 81 MG tablet Take 81 mg by mouth daily.   Yes Historical Provider, MD  atorvastatin (LIPITOR) 20 MG tablet Take 20 mg by mouth at  bedtime.   Yes Historical Provider, MD  cloNIDine (CATAPRES) 0.1 MG tablet Take 0.1 mg by mouth 2 (two) times daily.  02/12/14  Yes Historical Provider, MD  dicyclomine (BENTYL) 10 MG capsule Take 10 mg by mouth 3 (three) times daily as needed for spasms.   Yes Historical Provider, MD  esomeprazole (NEXIUM) 40 MG capsule Take 1 capsule (40 mg total) by mouth 2 (two) times daily. 11/14/13  Yes Noralee Space, MD  ferrous sulfate 325 (65 FE) MG tablet Take 325 mg by mouth daily with breakfast.   Yes Historical Provider, MD  fluticasone (FLONASE) 50 MCG/ACT nasal spray Place 2 sprays into both nostrils at bedtime.   Yes Historical Provider, MD  Fluticasone-Salmeterol (ADVAIR) 250-50 MCG/DOSE AEPB Inhale 1 puff into the lungs 2 (two) times daily. 03/22/14  Yes Noralee Space, MD  guaiFENesin (MUCINEX) 600 MG 12 hr tablet Take 600 mg by mouth 2 (two) times daily as needed for cough or to loosen phlegm (congestion).    Yes Historical Provider, MD  hydrALAZINE (APRESOLINE) 10 MG tablet take 1 tablet by mouth three times a day 04/09/14  Yes Noralee Space, MD  hydrochlorothiazide (HYDRODIURIL) 25 MG tablet Take 25 mg by mouth daily.   Yes Historical Provider, MD  hydrOXYzine (ATARAX/VISTARIL) 25 MG tablet Take 25 mg by mouth every 4 (four) hours as needed for itching.   Yes Historical Provider, MD  Insulin Detemir (LEVEMIR FLEXTOUCH) 100 UNIT/ML Pen Inject 50 Units into the skin 2 (two) times daily.   Yes Historical  Provider, MD  insulin lispro (HUMALOG KWIKPEN) 100 UNIT/ML SOPN Inject 30-52 Units into the skin 3 (three) times daily. Take 30 units  (+4-12 units based on sliding scale) every morning, 40 units (+4-12 units) in the afternoon and 40 units (+4-12 units) in the evening.   Yes Historical Provider, MD  isosorbide dinitrate (ISORDIL) 20 MG tablet Take 1 tablet (20 mg total) by mouth 3 (three) times daily. 08/20/13  Yes Noralee Space, MD  ketorolac (ACULAR LS) 0.4 % SOLN Place 1 drop into both eyes 3 (three) times daily.    Yes Historical Provider, MD  Linaclotide (LINZESS) 290 MCG CAPS capsule Take 290 mcg by mouth every other day. 06/14/13  Yes Irene Shipper, MD  LORazepam (ATIVAN) 1 MG tablet Take 1 mg by mouth at bedtime as needed for sleep.    Yes Historical Provider, MD  metoCLOPramide (REGLAN) 10 MG tablet Take 1 tablet (10 mg total) by mouth at bedtime. 03/22/14  Yes Noralee Space, MD  metoprolol tartrate (LOPRESSOR) 25 MG tablet Take 0.5 tablets (12.5 mg total) by mouth 2 (two) times daily. 05/01/13  Yes Larey Dresser, MD  Multiple Vitamins-Minerals (MULTIVITAMIN WITH MINERALS) tablet Take 1 tablet by mouth daily.   Yes Historical Provider, MD  oxyCODONE-acetaminophen (PERCOCET/ROXICET) 5-325 MG per tablet Take 1 tablet by mouth daily as needed for severe pain.   Yes Historical Provider, MD  predniSONE (DELTASONE) 5 MG tablet Take 5 mg by mouth daily with breakfast.   Yes Historical Provider, MD  pregabalin (LYRICA) 100 MG capsule Take 1 capsule (100 mg total) by mouth every evening. 11/14/13  Yes Noralee Space, MD  Probiotic Product (PROBIOTIC PO) Take 1 capsule by mouth daily.   Yes Historical Provider, MD  torsemide (DEMADEX) 20 MG tablet Take 2 tablets (40 mg total) by mouth daily. 05/03/13  Yes Jolaine Artist, MD  traMADol (ULTRAM) 50 MG tablet Take 1 tablet (50 mg  total) by mouth 3 (three) times daily as needed for moderate pain or severe pain. 02/18/14  Yes Noralee Space, MD  triamcinolone  ointment (KENALOG) 0.1 % Apply 1 application topically 2 (two) times daily as needed (psoriasis).   Yes Historical Provider, MD  doxycycline (VIBRAMYCIN) 100 MG capsule Take 1 capsule (100 mg total) by mouth 2 (two) times daily. 03/28/14   Orpah Greek, MD  Lancets MISC 1 Units by Does not apply route 4 (four) times daily - after meals and at bedtime.    Historical Provider, MD    Physical Exam: Filed Vitals:   04/27/14 0230 04/27/14 0245 04/27/14 0300 04/27/14 0315  BP: 162/55 141/56 139/56 142/58  Pulse: 79 80 81 77  Temp:      TempSrc:      Resp: 16 14 15 20   SpO2: 99% 99% 99% 99%   General: Not in acute distress HEENT:       Eyes: PERRL, EOMI, no scleral icterus       ENT: No discharge from the ears and nose, no pharynx injection, no tonsillar enlargement.        Neck: no bruit, no mass felt. Cardiac: S1/S2, RRR, No murmurs, gallops or rubs Pulm: decreased air movement bilaterally. No rales, wheezing or rubs. Abd: Soft, nondistended, nontender, no rebound pain, no organomegaly, BS present Ext: 2+ pitting leg edema bilaterally. 2+DP/PT pulse bilaterally Musculoskeletal: No joint deformities, erythema, or stiffness, ROM full Skin: No rashes.  Neuro: Alert and oriented X3, cranial nerves II-XII grossly intact, muscle strength 5/5 in all extremeties, sensation to light touch intact.  Psych: Patient is not psychotic, no suicidal or hemocidal ideation.  Labs on Admission:  Basic Metabolic Panel:  Recent Labs Lab 04/26/14 2358  NA 138  K 4.2  CL 99  CO2 23  GLUCOSE 228*  BUN 59*  CREATININE 3.06*  CALCIUM 9.0   Liver Function Tests: No results found for this basename: AST, ALT, ALKPHOS, BILITOT, PROT, ALBUMIN,  in the last 168 hours No results found for this basename: LIPASE, AMYLASE,  in the last 168 hours No results found for this basename: AMMONIA,  in the last 168 hours CBC:  Recent Labs Lab 04/26/14 2358  WBC 12.1*  HGB 10.5*  HCT 33.2*  MCV 90.7   PLT 483*   Cardiac Enzymes:  Recent Labs Lab 04/26/14 2358  TROPONINI <0.30    BNP (last 3 results)  Recent Labs  04/26/14 2358  PROBNP 3725.0*   CBG: No results found for this basename: GLUCAP,  in the last 168 hours  Radiological Exams on Admission: Dg Chest 2 View  04/27/2014   CLINICAL DATA:  Shortness of breath tonight.  EXAM: CHEST  2 VIEW  COMPARISON:  03/28/2014  FINDINGS: Mild cardiac enlargement without vascular congestion. No focal airspace disease or consolidation in the lungs. No blunting of costophrenic angles. No pneumothorax. Mediastinal contours appear intact. Surgical clips in the upper abdomen. No change since prior study.  IMPRESSION: Mild cardiac enlargement.  No evidence of active pulmonary disease.   Electronically Signed   By: Lucienne Capers M.D.   On: 04/27/2014 00:52    EKG: Independently reviewed.   Assessment/Plan Principal Problem:   CHF exacerbation Active Problems:   HYPERTENSION   CAD   ASTHMA   Sarcoidosis   Diabetes mellitus with renal manifestation   End stage renal disease   COPD (chronic obstructive pulmonary disease)  . CHF exacerbation:  patient symptoms are most likely caused by  CHF exacerbation given her worsening leg edema and an elevated proBNP. She is clinically fluid overloaded. Her previously 2-D echo on 04/03/13 showed EF 60-65% with grade 2 diastolic dysfunction. Other differential diagnoses include asthma or COPD exacerbation, which are less likely given no wheezing on lung auscultation. Pulmonary embolism is unlikely given no chest pain and tachycardia.   -will admit to Telemetry bed. -will treat with IV lasix 120 mg Bid -will continue home medications except for Torsemide and HCTZ. HCTZ is unlikely effective given her end-stage renal disease. -will cycle CE X3 -will repeat 2-D echo -strict In/Out -Daily body weight. -Low salt diet -Daily BMP and Mg level -Card was consulted by ED and will see patient in AM  .  CAD: History of nonobstructive coronary artery disease. No chest pain on admission. - continue with aspirin and Lipitor  . ASTHMA and COPD: Lung auscultation is clear without wheezing. It is unlikely to have acute exacerbation.  - albuterol and Atrovent nebulizers every 4 hours   . DM-II: Epic record showing patient is on Detemir 50 units twice a day,  but the patient reports that she is taking Lantus 50 units twice a day at home.  - will decrease Lantus to 40 units twice a day given poor oral intake - SSI  . HYPERTENSION:  - Continue with metoprolol, clonidine, hydralazine, - Because of the fact that she will be on Lasix, hold off on resuming amlodipine. Closely follow blood pressure trend and restart amlodipine when and if necessary.  . OBSTRUCTIVE SLEEP APNEA - CPAP each bedtime    . GERD - PPI  . Sarcoidosis: Patient has been followed up by Dr. Lenna Gilford. Her calcium is normal on admission. She is taking prednisone 5 mg daily at home - Will continue prednisone  . ESRD-V:  left arm fistula was placed on 8/17. Patient's baseline creatinine is 2.7-3.3. Her creatinine is 3.06 on admission. - will monitor kidney function closely with escalation of diuretics - BMP daily   DVT ppx: SQ Heparin  Code Status: Full code Family Communication: None at bed side.  Disposition Plan: Admit to inpatient  Ivor Costa [facility] Pager [phone]  If 7PM-7AM, please contact night-coverage [email or web address] Password [facility] 04/27/2014, 3:42 AM

## 2014-04-27 NOTE — ED Notes (Signed)
Pt alert, NAD, calm, interactive, resps e/u, speaking in clear complete sentences, VSS, exp. wheezing noted, skin W&D, pedal edema noted, +2, does not have HD, but is set up for HD, graft placed in L upper arm, +bruit/thrill.

## 2014-04-27 NOTE — Progress Notes (Signed)
  Echocardiogram 2D Echocardiogram has been performed.  [name] 04/27/2014, 12:42 PM

## 2014-04-27 NOTE — Consult Note (Signed)
Cardiology Consult Note FULP, CAMMIE, MD No ref. provider found  Reason for consult: CHF  History of Present Illness (and review of medical records): [name] is a 68 y.o. female who presents for evaluation of shortness of breath.  She has been seen in the past by Dr. Verl Blalock and CHF clinic.  She has hx of morbidy obesity, sleep apnea, asthma/COPD, sarcoidosis on chronic prednisone, CKD, diastolic CHF, CAD, and DM.  She was last admitted 04/2013 for CHF.  She reports shortness of breath, dyspnea on mild exertion, PND, orthopnea, lower extremity and abdominal swelling over the past 3 weeks.  She tried increasing her home diuretics for one week with no significant response.  She stopped as she did not want her to hurt her kidney function.  Reports compliance with medications and diet.  In the ED she was found to have BNP 3725.  Cr was 3.06.  Trop was < 0.03.  TTE: 04-2013 Study Conclusions - Left ventricle: The cavity size was normal. Wall thickness was increased in a pattern of mild LVH. Systolic function was normal. The estimated ejection fraction was in the range of 60% to 65%. Wall motion was normal; there were no regional wall motion abnormalities. Features are consistent with a pseudonormal left ventricular filling pattern, with concomitant abnormal relaxation and increased filling pressure (grade 2 diastolic dysfunction). - Aortic valve: There was mild stenosis.   Review of Systems Further review of systems was otherwise negative other than stated in HPI.   Past Medical History  Diagnosis Date  . Unspecified essential hypertension   . CAD (coronary artery disease) 01/04/2006    Tiny OM1 70-90% ostial stenosis, no other CAD  . Asthma   . Esophageal reflux   . Obesity, unspecified   . Anxiety state, unspecified   . Hypercalcemia   . Anemia   . Other psoriasis   . Diverticulosis   . Sarcoidosis 2012  . Dyslipidemia   . COPD (chronic obstructive pulmonary disease)   .  Gout   . Colon polyps     Tubular adenomatous polyps  . Spinal stenosis of lumbar region   . Bulging lumbar disc   . CHF (congestive heart failure)   . PNA (pneumonia) 2012    With pleural effusion, requiring thoracentesis  . Pneumonia     "several times" (04/02/2013)  . Pleural effusion 2012  . Chronic bronchitis   . Exertional shortness of breath     "and lying flat" (04/02/2013)  . On home oxygen therapy     "2L when I'm up; 3L when I'm at sleep" (04/02/2013)  . OSA (obstructive sleep apnea)     marginally compliant with CPAP  . Type II diabetes mellitus   . Degenerative joint disease   . Arthritis     'all over" (04/02/2013)  . Chronic lower back pain   . Diverticulosis   . GERD (gastroesophageal reflux disease)   . Anxiety   . Hyperlipidemia   . Sarcoidosis of lung   . Kidney disease     baseline creatinie 2.0,  folowed by Dr Hassell Done  . Renal failure     "kidney's are working @ 50%" (04/02/2013)  . Renal insufficiency     2015 now Stage 4  . IBS (irritable bowel syndrome)   . Cataracts, both eyes   . Dysrhythmia   . PYKDXIPJ(825.0)     Past Surgical History  Procedure Laterality Date  . Vesicovaginal fistula closure w/  total abdominal hysterectomy  1992  .  Bilateral total knee replacements Bilateral Rt=5/04 & Lft=1/09    by DrAlusio  . 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 Zella Richer  . Appendectomy  1957  . Tonsillectomy  1968  . Cholecystectomy  1980's  . Abdominal hysterectomy  1992  . Reduction mammaplasty Bilateral 1980's  . Cardiac catheterization  1990's  . Thoracentesis  2012  . Joint replacement    . Av fistula placement Left 12/31/2013    Procedure: ARTERIOVENOUS (AV) FISTULA CREATION;  Surgeon: Elam Dutch, MD;  Location: [facility];  Service: Vascular;  Laterality: Left;  . Colonoscopy w/ biopsies and polypectomy    . Av fistula placement Left 03/18/2014    Procedure: ARTERIOVENOUS (AV) FISTULA CREATION- LEFT BRACHIOCEPHALIC ;  Surgeon: Elam Dutch, MD;   Location: [facility];  Service: Vascular;  Laterality: Left;  . Ligation of arteriovenous  fistula Left 03/18/2014    Procedure: LIGATION OF ARTERIOVENOUS  FISTULA- LEFT RADIOCEPHALIC;  Surgeon: Elam Dutch, MD;  Location: [place] [facility];  Service: Vascular;  Laterality: Left;    Prescriptions prior to admission  Medication Sig Dispense Refill  . acetaminophen (TYLENOL) 500 MG tablet Take 500 mg by mouth 2 (two) times daily as needed (pain).       Marland Kitchen albuterol (PROVENTIL HFA;VENTOLIN HFA) 108 (90 BASE) MCG/ACT inhaler Inhale 2 puffs into the lungs every 4 (four) hours as needed for wheezing or shortness of breath.       Marland Kitchen albuterol (PROVENTIL) (2.5 MG/3ML) 0.083% nebulizer solution Take 2.5 mg by nebulization every 2 (two) hours as needed for wheezing or shortness of breath.      . Alpha-D-Galactosidase (BEANO PO) Take 1 tablet by mouth daily.      Marland Kitchen Apremilast (OTEZLA) 30 MG TABS Take 30 mg by mouth daily.       Marland Kitchen aspirin EC 81 MG tablet Take 81 mg by mouth daily.      Marland Kitchen atorvastatin (LIPITOR) 20 MG tablet Take 20 mg by mouth at bedtime.      . cloNIDine (CATAPRES) 0.1 MG tablet Take 0.1 mg by mouth 2 (two) times daily.       Marland Kitchen dicyclomine (BENTYL) 10 MG capsule Take 10 mg by mouth 3 (three) times daily as needed for spasms.      Marland Kitchen esomeprazole (NEXIUM) 40 MG capsule Take 1 capsule (40 mg total) by mouth 2 (two) times daily.  60 capsule  3  . ferrous sulfate 325 (65 FE) MG tablet Take 325 mg by mouth daily with breakfast.      . fluticasone (FLONASE) 50 MCG/ACT nasal spray Place 2 sprays into both nostrils at bedtime.      . Fluticasone-Salmeterol (ADVAIR) 250-50 MCG/DOSE AEPB Inhale 1 puff into the lungs 2 (two) times daily.  180 each  3  . guaiFENesin (MUCINEX) 600 MG 12 hr tablet Take 600 mg by mouth 2 (two) times daily as needed for cough or to loosen phlegm (congestion).       . hydrALAZINE (APRESOLINE) 10 MG tablet take 1 tablet by mouth three times a day  90 tablet  5  . hydrochlorothiazide  (HYDRODIURIL) 25 MG tablet Take 25 mg by mouth daily.      . hydrOXYzine (ATARAX/VISTARIL) 25 MG tablet Take 25 mg by mouth every 4 (four) hours as needed for itching.      . Insulin Detemir (LEVEMIR FLEXTOUCH) 100 UNIT/ML Pen Inject 50 Units into the skin 2 (two) times daily.      Marland Kitchen  insulin lispro (HUMALOG KWIKPEN) 100 UNIT/ML SOPN Inject 30-52 Units into the skin 3 (three) times daily. Take 30 units  (+4-12 units based on sliding scale) every morning, 40 units (+4-12 units) in the afternoon and 40 units (+4-12 units) in the evening.      . isosorbide dinitrate (ISORDIL) 20 MG tablet Take 1 tablet (20 mg total) by mouth 3 (three) times daily.  270 tablet  1  . ketorolac (ACULAR LS) 0.4 % SOLN Place 1 drop into both eyes 3 (three) times daily.       . Linaclotide (LINZESS) 290 MCG CAPS capsule Take 290 mcg by mouth every other day.      Marland Kitchen LORazepam (ATIVAN) 1 MG tablet Take 1 mg by mouth at bedtime as needed for sleep.       . metoCLOPramide (REGLAN) 10 MG tablet Take 1 tablet (10 mg total) by mouth at bedtime.  90 tablet  1  . metoprolol tartrate (LOPRESSOR) 25 MG tablet Take 0.5 tablets (12.5 mg total) by mouth 2 (two) times daily.  90 tablet  3  . Multiple Vitamins-Minerals (MULTIVITAMIN WITH MINERALS) tablet Take 1 tablet by mouth daily.      Marland Kitchen oxyCODONE-acetaminophen (PERCOCET/ROXICET) 5-325 MG per tablet Take 1 tablet by mouth daily as needed for severe pain.      . predniSONE (DELTASONE) 5 MG tablet Take 5 mg by mouth daily with breakfast.      . pregabalin (LYRICA) 100 MG capsule Take 1 capsule (100 mg total) by mouth every evening.  30 capsule  3  . Probiotic Product (PROBIOTIC PO) Take 1 capsule by mouth daily.      Marland Kitchen torsemide (DEMADEX) 20 MG tablet Take 2 tablets (40 mg total) by mouth daily.  60 tablet  3  . traMADol (ULTRAM) 50 MG tablet Take 1 tablet (50 mg total) by mouth 3 (three) times daily as needed for moderate pain or severe pain.  50 tablet  2  . triamcinolone ointment  (KENALOG) 0.1 % Apply 1 application topically 2 (two) times daily as needed (psoriasis).      Marland Kitchen doxycycline (VIBRAMYCIN) 100 MG capsule Take 1 capsule (100 mg total) by mouth 2 (two) times daily.  20 capsule  0  . Lancets MISC 1 Units by Does not apply route 4 (four) times daily - after meals and at bedtime.       Allergies  Allergen Reactions  . Adhesive [Tape] Other (See Comments)    Blisters   . Avelox [Moxifloxacin Hcl In Nacl] Other (See Comments)    GI upset  . Codeine Other (See Comments)    "crazy"   . Guaifenesin Nausea And Vomiting    Takes Mucinex at home without issue  . Latex Swelling  . Oxycodone Nausea And Vomiting    Takes Percocet at home without issue    History  Substance Use Topics  . Smoking status: Never Smoker   . Smokeless tobacco: Never Used  . Alcohol Use: No    Family History  Problem Relation Age of Onset  . Diabetes Mother   . Heart disease Mother   . Hyperlipidemia Mother   . Varicose Veins Mother   . Breast cancer Maternal Aunt   . Heart disease Father   . Deep vein thrombosis Father   . Hyperlipidemia Father   . Heart disease Sister     before age 43  . Cancer Sister   . Diabetes Sister   . Hyperlipidemia Sister   .  Heart attack Sister   . Heart disease Brother   . Diabetes Brother   . Hyperlipidemia Brother   . Heart attack Brother   . Hyperlipidemia Sister      Objective:  Patient Vitals for the past 8 hrs:  BP Temp Temp src Pulse Resp SpO2 Height Weight  04/27/14 0504 - - - - - 97 % - -  04/27/14 0402 148/59 mmHg 98.6 F (37 C) Oral 80 20 100 % _0  (1.575 m) 102.876 kg (226 lb 12.8 oz)  04/27/14 0315 142/58 mmHg - - 77 20 99 % - -  04/27/14 0300 139/56 mmHg - - 81 15 99 % - -  04/27/14 0245 141/56 mmHg - - 80 14 99 % - -  04/27/14 0230 162/55 mmHg - - 79 16 99 % - -  04/27/14 0215 149/63 mmHg - - 78 25 100 % - -  04/27/14 0200 156/62 mmHg - - 84 13 100 % - -  04/27/14 0145 159/64 mmHg - - 84 18 100 % - -  04/27/14  0130 155/64 mmHg - - 80 15 100 % - -  04/27/14 0115 151/69 mmHg - - 77 17 100 % - -  04/27/14 0109 141/76 mmHg - - 83 18 100 % - -  04/27/14 0030 - 99.2 F (37.3 C) Oral - - - - -  04/27/14 0030 139/57 mmHg - - 71 21 100 % - -  04/27/14 0015 - - - 84 24 100 % - -   General appearance: alert, obese female Head: Normocephalic, without obvious abnormality, atraumatic Eyes: conjunctivae/corneas clear. PERRL, EOM's intact. Fundi benign. Neck: supple, difficult to detect JVD, thick neck Lungs: decreased breath sounds Chest wall: no tenderness Heart: regular rate and rhythm Abd: soft, NT Extremities: 2+ BLE edema Pulses: 2+ and symmetric Neurologic: Grossly normal  Results for orders placed during the hospital encounter of 04/26/14 (from the past 48 hour(s))  CBC     Status: Abnormal   Collection Time    04/26/14 11:58 PM      Result Value Ref Range   WBC 12.1 (*) 4.0 - 10.5 K/uL   RBC 3.66 (*) 3.87 - 5.11 MIL/uL   Hemoglobin 10.5 (*) 12.0 - 15.0 g/dL   HCT 33.2 (*) 36.0 - 46.0 %   MCV 90.7  78.0 - 100.0 fL   MCH 28.7  26.0 - 34.0 pg   MCHC 31.6  30.0 - 36.0 g/dL   RDW 14.7  11.5 - 15.5 %   Platelets 483 (*) 150 - 400 K/uL  BASIC METABOLIC PANEL     Status: Abnormal   Collection Time    04/26/14 11:58 PM      Result Value Ref Range   Sodium 138  137 - 147 mEq/L   Potassium 4.2  3.7 - 5.3 mEq/L   Chloride 99  96 - 112 mEq/L   CO2 23  19 - 32 mEq/L   Glucose, Bld 228 (*) 70 - 99 mg/dL   BUN 59 (*) 6 - 23 mg/dL   Creatinine, Ser 3.06 (*) 0.50 - 1.10 mg/dL   Calcium 9.0  8.4 - 10.5 mg/dL   GFR calc non Af Amer 15 (*) >90 mL/min   GFR calc Af Amer 17 (*) >90 mL/min   Comment: (NOTE)     The eGFR has been calculated using the CKD EPI equation.     This calculation has not been validated in all clinical situations.  eGFR's persistently <90 mL/min signify possible Chronic Kidney     Disease.   Anion gap 16 (*) 5 - 15  PRO B NATRIURETIC PEPTIDE     Status: Abnormal    Collection Time    04/26/14 11:58 PM      Result Value Ref Range   Pro B Natriuretic peptide (BNP) 3725.0 (*) 0 - 125 pg/mL  TROPONIN I     Status: None   Collection Time    04/26/14 11:58 PM      Result Value Ref Range   Troponin I <0.30  <0.30 ng/mL   Comment:            Due to the release kinetics of cTnI,     a negative result within the first hours     of the onset of symptoms does not rule out     myocardial infarction with certainty.     If myocardial infarction is still suspected,     repeat the test at appropriate intervals.   Dg Chest 2 View  04/27/2014   CLINICAL DATA:  Shortness of breath tonight.  EXAM: CHEST  2 VIEW  COMPARISON:  03/28/2014  FINDINGS: Mild cardiac enlargement without vascular congestion. No focal airspace disease or consolidation in the lungs. No blunting of costophrenic angles. No pneumothorax. Mediastinal contours appear intact. Surgical clips in the upper abdomen. No change since prior study.  IMPRESSION: Mild cardiac enlargement.  No evidence of active pulmonary disease.   Electronically Signed   By: Lucienne Capers M.D.   On: 04/27/2014 00:52    ECG:  Sinus rhythm HR 87, low voltage, no significant change from prior.  Impression: Acute on chronic diastolic HF Acute on chronic renal failure Cardiorenal syndrome Morbid Obesity Sarcoidosis Chronic resp failure COPD OSA  Recommendations: - Given worsening renal function, would suggest gentle diuresis with  Lasix drip - Hemodynamically stable, no need for inotropic support at this time - Monitor strict I/Os, daily weights - Monitor renal function, electrolytes, consider nephrology consult - TTE in am reassess LV function, aortic valve - Continue Bidil, low dose BB, otherwise ASA, Statin  Thank you for consult.  Will continue to follow and make further recommendations as indicated.

## 2014-04-27 NOTE — ED Notes (Signed)
Back from xray, no changes, alert, NAD, calm, interactive, pending results.

## 2014-04-27 NOTE — Plan of Care (Signed)
Problem: Phase I Progression Outcomes Goal: Dyspnea controlled at rest (HF) Outcome: Progressing Shortness of breath has lessened.

## 2014-04-27 NOTE — Evaluation (Signed)
Physical Therapy Evaluation Patient Details Name: [name] MRN: [id number] DOB: [birth year] Today's Date: 04/27/2014   History of Present Illness  Pt is a 68 y.o. female with past medical history of asthma, COPD, sarcoidosis, diabetes type 2, CAD, ESRD, OSA, diastolic congestive heart failure, who presents with worsening leg edema and shortness of breath.   Clinical Impression  Pt admitted with the above. Pt currently with functional limitations due to the deficits listed below (see PT Problem List). At the time of PT eval pt was able to perform transfers and ambulation with occasional assist for balance. Pt required 3L/min supplemental O2 throughout session and at end of gait training sats were 92%. Pt will benefit from skilled PT to increase their independence and safety with mobility to allow discharge to the venue listed below.       Follow Up Recommendations Home health PT;Supervision for mobility/OOB    Equipment Recommendations  None recommended by PT    Recommendations for Other Services       Precautions / Restrictions Precautions Precautions: Fall Restrictions Weight Bearing Restrictions: No      Mobility  Bed Mobility Overal bed mobility: Needs Assistance Bed Mobility: Supine to Sit     Supine to sit: Supervision     General bed mobility comments: Pt able to transition to EOB well without assist. Supervision for safety as pt was somewhat imulsive due to urgent need to use the bathroom.   Transfers Overall transfer level: Needs assistance Equipment used: Rolling walker (2 wheeled);None Transfers: Sit to/from Stand Sit to Stand: Min guard         General transfer comment: Pt initially stood from EOB without AD and required steadying assist. With RW, pt did not require assist.   Ambulation/Gait Ambulation/Gait assistance: Min guard;Min assist Ambulation Distance (Feet): 100 Feet Assistive device: Rolling walker (2 wheeled);None Gait  Pattern/deviations: Step-through pattern;Decreased stride length;Trunk flexed Gait velocity: Decreased Gait velocity interpretation: Below normal speed for age/gender General Gait Details: Pt initially walked from bed to bathroom without an AD, and required min assist once for LOB. With RW, pt did not require any steadying assist to maintain balance.   Stairs            Wheelchair Mobility    Modified Rankin (Stroke Patients Only)       Balance Overall balance assessment: Needs assistance Sitting-balance support: Feet supported;No upper extremity supported Sitting balance-Leahy Scale: Good     Standing balance support: No upper extremity supported;During functional activity Standing balance-Leahy Scale: Poor Standing balance comment: Required min assist once for LOB laterally to the R                             Pertinent Vitals/Pain Pain Assessment: No/denies pain    Home Living Family/patient expects to be discharged to:: Private residence Living Arrangements: Alone Available Help at Discharge: Family;Available 24 hours/day Type of Home: Apartment Home Access: Level entry     Home Layout: One level Home Equipment: Walker - 2 wheels;Cane - single point;Wheelchair - [employer];Shower seat;Walker - 4 wheels      Prior Function Level of Independence: Independent with assistive device(s)         Comments: Used walker occasionally, did not require assistance with ADL's.     Hand Dominance   Dominant Hand: Right    Extremity/Trunk Assessment   Upper Extremity Assessment: Defer to OT evaluation  Lower Extremity Assessment: Generalized weakness      Cervical / Trunk Assessment: Normal  Communication      Cognition Arousal/Alertness: Lethargic Behavior During Therapy: WFL for tasks assessed/performed Overall Cognitive Status: Within Functional Limits for tasks assessed                      General  Comments      Exercises        Assessment/Plan    PT Assessment Patient needs continued PT services  PT Diagnosis Difficulty walking;Generalized weakness   PT Problem List Decreased strength;Decreased activity tolerance;Decreased range of motion;Decreased balance;Decreased mobility;Decreased knowledge of use of DME;Cardiopulmonary status limiting activity  PT Treatment Interventions DME instruction;Gait training;Stair training;Functional mobility training;Therapeutic activities;Therapeutic exercise;Neuromuscular re-education;Patient/family education   PT Goals (Current goals can be found in the Care Plan section) Acute Rehab PT Goals Patient Stated Goal: To return home PT Goal Formulation: With patient Time For Goal Achievement: 05/04/14 Potential to Achieve Goals: Good    Frequency Min 3X/week   Barriers to discharge        Co-evaluation               End of Session Equipment Utilized During Treatment: Gait belt;Oxygen Activity Tolerance: Patient tolerated treatment well Patient left: in chair;with call bell/phone within reach;with chair alarm set Nurse Communication: Mobility status         Time: 2423-5361 PT Time Calculation (min): 24 min   Charges:   PT Evaluation $Initial PT Evaluation Tier I: 1 Procedure PT Treatments $Gait Training: 8-22 mins   PT G CodesJolyn Lent 04/27/2014, 5:34 PM  Jolyn Lent, PT, DPT Acute Rehabilitation Services Pager: [phone]

## 2014-04-27 NOTE — Progress Notes (Signed)
Patient requested to start CPAP tomorrow evening.

## 2014-04-27 NOTE — Progress Notes (Signed)
Patients blood sugar is 247.

## 2014-04-27 NOTE — ED Provider Notes (Signed)
CSN: [id number]     Arrival date & time 04/26/14  2343 History   First MD Initiated Contact with Patient 04/27/14 0000     Chief Complaint  Patient presents with  . Shortness of Breath     (Consider location/radiation/quality/duration/timing/severity/associated sxs/prior Treatment) HPI [name] is a 68 y.o. female with past medical history of coronary artery disease, COPD, CHF, sarcoidosis coming in with shortness of breath. Patient states this is developed over the last 2 weeks. She denies any chest pain. He states that the fluid has been building up despite being compliant with her medications. She for bilateral lower extremity swelling and abdominal swelling. She normally takes torsemide 20 mg per day however she's been taking this twice a day without any effect. She also admits to worsening sleep orthopnea dyspnea on exertion. He normally uses 2 L nasal cannula at home as needed for shortness of breath however she has begun to require 3 L. Her symptoms are made worse with any type of movement or exertion. Patient has no further complaints.  10 Systems reviewed and are negative for acute change except as noted in the HPI.      Past Medical History  Diagnosis Date  . Unspecified essential hypertension   . CAD (coronary artery disease) 01/04/2006    Tiny OM1 70-90% ostial stenosis, no other CAD  . Asthma   . Esophageal reflux   . Obesity, unspecified   . Anxiety state, unspecified   . Hypercalcemia   . Anemia   . Other psoriasis   . Diverticulosis   . Sarcoidosis 2012  . Dyslipidemia   . COPD (chronic obstructive pulmonary disease)   . Gout   . Colon polyps     Tubular adenomatous polyps  . Spinal stenosis of lumbar region   . Bulging lumbar disc   . CHF (congestive heart failure)   . PNA (pneumonia) 2012    With pleural effusion, requiring thoracentesis  . Pneumonia     "several times" (04/02/2013)  . Pleural effusion 2012  . Chronic bronchitis   . Exertional  shortness of breath     "and lying flat" (04/02/2013)  . On home oxygen therapy     "2L when I'm up; 3L when I'm at sleep" (04/02/2013)  . OSA (obstructive sleep apnea)     marginally compliant with CPAP  . Type II diabetes mellitus   . Degenerative joint disease   . Arthritis     'all over" (04/02/2013)  . Chronic lower back pain   . Diverticulosis   . GERD (gastroesophageal reflux disease)   . Anxiety   . Hyperlipidemia   . Sarcoidosis of lung   . Kidney disease     baseline creatinie 2.0,  folowed by Dr Hassell Done  . Renal failure     "kidney's are working @ 50%" (04/02/2013)  . Renal insufficiency     2015 now Stage 4  . IBS (irritable bowel syndrome)   . Cataracts, both eyes   . Dysrhythmia   . UMPNTIRW(431.5)    Past Surgical History  Procedure Laterality Date  . Vesicovaginal fistula closure w/  total abdominal hysterectomy  1992  . Bilateral total knee replacements Bilateral Rt=5/04 & Lft=1/09    by DrAlusio  . 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 Zella Richer  . Appendectomy  1957  . Tonsillectomy  1968  . Cholecystectomy  1980's  . Abdominal hysterectomy  1992  . Reduction mammaplasty Bilateral 1980's  . Cardiac catheterization  1990's  . Thoracentesis  2012  . Joint replacement    . Av fistula placement Left 12/31/2013    Procedure: ARTERIOVENOUS (AV) FISTULA CREATION;  Surgeon: Elam Dutch, MD;  Location: [facility];  Service: Vascular;  Laterality: Left;  . Colonoscopy w/ biopsies and polypectomy    . Av fistula placement Left 03/18/2014    Procedure: ARTERIOVENOUS (AV) FISTULA CREATION- LEFT BRACHIOCEPHALIC ;  Surgeon: Elam Dutch, MD;  Location: [facility];  Service: Vascular;  Laterality: Left;  . Ligation of arteriovenous  fistula Left 03/18/2014    Procedure: LIGATION OF ARTERIOVENOUS  FISTULA- LEFT RADIOCEPHALIC;  Surgeon: Elam Dutch, MD;  Location: [facility] OR;  Service: Vascular;  Laterality: Left;   Family History  Problem Relation Age of Onset  . Diabetes Mother    . Heart disease Mother   . Hyperlipidemia Mother   . Varicose Veins Mother   . Breast cancer Maternal Aunt   . Heart disease Father   . Deep vein thrombosis Father   . Hyperlipidemia Father   . Heart disease Sister     before age 18  . Cancer Sister   . Diabetes Sister   . Hyperlipidemia Sister   . Heart attack Sister   . Heart disease Brother   . Diabetes Brother   . Hyperlipidemia Brother   . Heart attack Brother   . Hyperlipidemia Sister    History  Substance Use Topics  . Smoking status: Never Smoker   . Smokeless tobacco: Never Used  . Alcohol Use: No   OB History   Grav Para Term Preterm Abortions TAB SAB Ect Mult Living                 Review of Systems    Allergies  Adhesive; Avelox; Codeine; Guaifenesin; Latex; and Oxycodone  Home Medications   Prior to Admission medications   Medication Sig Start Date End Date Taking? Authorizing Provider  acetaminophen (TYLENOL) 500 MG tablet Take 500 mg by mouth 2 (two) times daily as needed (pain).    Yes Historical Provider, MD  albuterol (PROVENTIL HFA;VENTOLIN HFA) 108 (90 BASE) MCG/ACT inhaler Inhale 2 puffs into the lungs every 4 (four) hours as needed for wheezing or shortness of breath.    Yes Historical Provider, MD  albuterol (PROVENTIL) (2.5 MG/3ML) 0.083% nebulizer solution Take 2.5 mg by nebulization every 2 (two) hours as needed for wheezing or shortness of breath.   Yes Historical Provider, MD  Alpha-D-Galactosidase (BEANO PO) Take 1 tablet by mouth daily.   Yes Historical Provider, MD  Apremilast (OTEZLA) 30 MG TABS Take 30 mg by mouth daily.    Yes Historical Provider, MD  aspirin EC 81 MG tablet Take 81 mg by mouth daily.   Yes Historical Provider, MD  atorvastatin (LIPITOR) 20 MG tablet Take 20 mg by mouth at bedtime.   Yes Historical Provider, MD  cloNIDine (CATAPRES) 0.1 MG tablet Take 0.1 mg by mouth 2 (two) times daily.  02/12/14  Yes Historical Provider, MD  dicyclomine (BENTYL) 10 MG capsule Take  10 mg by mouth 3 (three) times daily as needed for spasms.   Yes Historical Provider, MD  esomeprazole (NEXIUM) 40 MG capsule Take 1 capsule (40 mg total) by mouth 2 (two) times daily. 11/14/13  Yes Noralee Space, MD  ferrous sulfate 325 (65 FE) MG tablet Take 325 mg by mouth daily with breakfast.   Yes Historical Provider, MD  fluticasone (FLONASE) 50 MCG/ACT nasal spray Place 2 sprays  into both nostrils at bedtime.   Yes Historical Provider, MD  Fluticasone-Salmeterol (ADVAIR) 250-50 MCG/DOSE AEPB Inhale 1 puff into the lungs 2 (two) times daily. 03/22/14  Yes Noralee Space, MD  guaiFENesin (MUCINEX) 600 MG 12 hr tablet Take 600 mg by mouth 2 (two) times daily as needed for cough or to loosen phlegm (congestion).    Yes Historical Provider, MD  hydrALAZINE (APRESOLINE) 10 MG tablet take 1 tablet by mouth three times a day 04/09/14  Yes Noralee Space, MD  hydrochlorothiazide (HYDRODIURIL) 25 MG tablet Take 25 mg by mouth daily.   Yes Historical Provider, MD  hydrOXYzine (ATARAX/VISTARIL) 25 MG tablet Take 25 mg by mouth every 4 (four) hours as needed for itching.   Yes Historical Provider, MD  Insulin Detemir (LEVEMIR FLEXTOUCH) 100 UNIT/ML Pen Inject 50 Units into the skin 2 (two) times daily.   Yes Historical Provider, MD  insulin lispro (HUMALOG KWIKPEN) 100 UNIT/ML SOPN Inject 30-52 Units into the skin 3 (three) times daily. Take 30 units  (+4-12 units based on sliding scale) every morning, 40 units (+4-12 units) in the afternoon and 40 units (+4-12 units) in the evening.   Yes Historical Provider, MD  isosorbide dinitrate (ISORDIL) 20 MG tablet Take 1 tablet (20 mg total) by mouth 3 (three) times daily. 08/20/13  Yes Noralee Space, MD  ketorolac (ACULAR LS) 0.4 % SOLN Place 1 drop into both eyes 3 (three) times daily.    Yes Historical Provider, MD  Linaclotide (LINZESS) 290 MCG CAPS capsule Take 290 mcg by mouth every other day. 06/14/13  Yes Irene Shipper, MD  LORazepam (ATIVAN) 1 MG tablet Take 1 mg  by mouth at bedtime as needed for sleep.    Yes Historical Provider, MD  metoCLOPramide (REGLAN) 10 MG tablet Take 1 tablet (10 mg total) by mouth at bedtime. 03/22/14  Yes Noralee Space, MD  metoprolol tartrate (LOPRESSOR) 25 MG tablet Take 0.5 tablets (12.5 mg total) by mouth 2 (two) times daily. 05/01/13  Yes Larey Dresser, MD  Multiple Vitamins-Minerals (MULTIVITAMIN WITH MINERALS) tablet Take 1 tablet by mouth daily.   Yes Historical Provider, MD  oxyCODONE-acetaminophen (PERCOCET/ROXICET) 5-325 MG per tablet Take 1 tablet by mouth daily as needed for severe pain.   Yes Historical Provider, MD  predniSONE (DELTASONE) 5 MG tablet Take 5 mg by mouth daily with breakfast.   Yes Historical Provider, MD  pregabalin (LYRICA) 100 MG capsule Take 1 capsule (100 mg total) by mouth every evening. 11/14/13  Yes Noralee Space, MD  Probiotic Product (PROBIOTIC PO) Take 1 capsule by mouth daily.   Yes Historical Provider, MD  torsemide (DEMADEX) 20 MG tablet Take 2 tablets (40 mg total) by mouth daily. 05/03/13  Yes Jolaine Artist, MD  traMADol (ULTRAM) 50 MG tablet Take 1 tablet (50 mg total) by mouth 3 (three) times daily as needed for moderate pain or severe pain. 02/18/14  Yes Noralee Space, MD  triamcinolone ointment (KENALOG) 0.1 % Apply 1 application topically 2 (two) times daily as needed (psoriasis).   Yes Historical Provider, MD  doxycycline (VIBRAMYCIN) 100 MG capsule Take 1 capsule (100 mg total) by mouth 2 (two) times daily. 03/28/14   Orpah Greek, MD  Lancets MISC 1 Units by Does not apply route 4 (four) times daily - after meals and at bedtime.    Historical Provider, MD   There were no vitals taken for this visit. Physical Exam  Nursing note  and vitals reviewed. Constitutional: She is oriented to person, place, and time. She appears well-developed and well-nourished. No distress.  HENT:  Head: Normocephalic and atraumatic.  Nose: Nose normal.  Mouth/Throat: Oropharynx is clear  and moist. No oropharyngeal exudate.  Nasal cannula in place.  Eyes: Conjunctivae and EOM are normal. Pupils are equal, round, and reactive to light. No scleral icterus.  Neck: Normal range of motion. Neck supple. No JVD present. No tracheal deviation present. No thyromegaly present.  Cardiovascular: Normal rate, regular rhythm and normal heart sounds.  Exam reveals no gallop and no friction rub.   No murmur heard. Pulmonary/Chest: Effort normal and breath sounds normal. No respiratory distress. She has no wheezes. She exhibits no tenderness.  Abdominal: Soft. Bowel sounds are normal. She exhibits distension. She exhibits no mass. There is no tenderness. There is no rebound and no guarding.  Musculoskeletal: Normal range of motion. She exhibits edema. She exhibits no tenderness.  1+ bilateral lower extremity edema to the knees.  Lymphadenopathy:    She has no cervical adenopathy.  Neurological: She is alert and oriented to person, place, and time. No cranial nerve deficit. She exhibits normal muscle tone.  Skin: Skin is warm and dry. No rash noted. She is not diaphoretic. No erythema. No pallor.    ED Course  Procedures (including critical care time) Labs Review Labs Reviewed  CBC - Abnormal; Notable for the following:    WBC 12.1 (*)    RBC 3.66 (*)    Hemoglobin 10.5 (*)    HCT 33.2 (*)    Platelets 483 (*)    All other components within normal limits  BASIC METABOLIC PANEL - Abnormal; Notable for the following:    Glucose, Bld 228 (*)    BUN 59 (*)    Creatinine, Ser 3.06 (*)    GFR calc non Af Amer 15 (*)    GFR calc Af Amer 17 (*)    Anion gap 16 (*)    All other components within normal limits  PRO B NATRIURETIC PEPTIDE - Abnormal; Notable for the following:    Pro B Natriuretic peptide (BNP) 3725.0 (*)    All other components within normal limits  BASIC METABOLIC PANEL - Abnormal; Notable for the following:    Glucose, Bld 187 (*)    BUN 60 (*)    Creatinine, Ser 3.06  (*)    GFR calc non Af Amer 15 (*)    GFR calc Af Amer 17 (*)    All other components within normal limits  GLUCOSE, CAPILLARY - Abnormal; Notable for the following:    Glucose-Capillary 247 (*)    All other components within normal limits  GLUCOSE, CAPILLARY - Abnormal; Notable for the following:    Glucose-Capillary 328 (*)    All other components within normal limits  TROPONIN I  TROPONIN I  TROPONIN I  MAGNESIUM  TROPONIN I  URINALYSIS, ROUTINE W REFLEX MICROSCOPIC  URINALYSIS, ROUTINE W REFLEX MICROSCOPIC    Imaging Review Dg Chest 2 View  04/27/2014   CLINICAL DATA:  Shortness of breath tonight.  EXAM: CHEST  2 VIEW  COMPARISON:  03/28/2014  FINDINGS: Mild cardiac enlargement without vascular congestion. No focal airspace disease or consolidation in the lungs. No blunting of costophrenic angles. No pneumothorax. Mediastinal contours appear intact. Surgical clips in the upper abdomen. No change since prior study.  IMPRESSION: Mild cardiac enlargement.  No evidence of active pulmonary disease.   Electronically Signed   By: Gwyndolyn Saxon  Gerilyn Nestle M.D.   On: 04/27/2014 00:52     EKG Interpretation None     MUSE not working: NSR, rate 87, no ischemic changes, no significant change since last tracing  MDM   Final diagnoses:  None   Patient presents out of concern for SOB.  Appears to be worsening fluid overload from CHF.  She has worsening DOE, SOB, BLE and abdominal edema, and sleep orthopnea.  BNP is 3000 which is high for her.  She has increased her torsemide at home without any effect.  Cardiology paged for admission, rec medicine admission and they will consult due to her multiple other medical problems.   Hospitalist will admit the patient for continued care. She was given breathing treatment for symptomatic relief (requested by patient), lasix was held as she was told not to take it due to her kidneys.  Will defer to inpatient team for diuresis.   Everlene Balls, MD 04/27/14  [phone]

## 2014-04-27 NOTE — Progress Notes (Signed)
PT Cancellation Note  Patient Details Name: ALYCIA COOPERWOOD MRN: [id number] DOB: November 10, 1945   Cancelled Treatment:    Reason Eval/Treat Not Completed: Patient at procedure or test/unavailable.  Will check back as time allows.    Jolyn Lent 04/27/2014, 12:22 PM  Jolyn Lent, PT, DPT Acute Rehabilitation Services Pager: [phone]

## 2014-04-27 NOTE — ED Notes (Signed)
Updated, family at [facility], "feels like a breathing tx might help", EDP aware.

## 2014-04-27 NOTE — ED Notes (Signed)
RT into room, at [facility].

## 2014-04-27 NOTE — ED Notes (Signed)
Admitting MD at [facility].  

## 2014-04-28 ENCOUNTER — Encounter (HOSPITAL_COMMUNITY): Payer: Self-pay | Admitting: Cardiology

## 2014-04-28 ENCOUNTER — Inpatient Hospital Stay (HOSPITAL_COMMUNITY): Payer: Medicare Other

## 2014-04-28 DIAGNOSIS — IMO0001 Reserved for inherently not codable concepts without codable children: Secondary | ICD-10-CM | POA: Diagnosis not present

## 2014-04-28 DIAGNOSIS — J9600 Acute respiratory failure, unspecified whether with hypoxia or hypercapnia: Secondary | ICD-10-CM

## 2014-04-28 LAB — GLUCOSE, CAPILLARY
GLUCOSE-CAPILLARY: 119 mg/dL — AB (ref 70–99)
GLUCOSE-CAPILLARY: 280 mg/dL — AB (ref 70–99)
Glucose-Capillary: 168 mg/dL — ABNORMAL HIGH (ref 70–99)
Glucose-Capillary: 197 mg/dL — ABNORMAL HIGH (ref 70–99)
Glucose-Capillary: 245 mg/dL — ABNORMAL HIGH (ref 70–99)
Glucose-Capillary: 269 mg/dL — ABNORMAL HIGH (ref 70–99)

## 2014-04-28 LAB — BASIC METABOLIC PANEL
Anion gap: 14 (ref 5–15)
BUN: 64 mg/dL — ABNORMAL HIGH (ref 6–23)
CALCIUM: 9 mg/dL (ref 8.4–10.5)
CO2: 24 mEq/L (ref 19–32)
Chloride: 99 mEq/L (ref 96–112)
Creatinine, Ser: 3.38 mg/dL — ABNORMAL HIGH (ref 0.50–1.10)
GFR, EST AFRICAN AMERICAN: 15 mL/min — AB (ref >90)
GFR, EST NON AFRICAN AMERICAN: 13 mL/min — AB (ref >90)
GLUCOSE: 154 mg/dL — AB (ref 70–99)
Potassium: 4.3 mEq/L (ref 3.7–5.3)
SODIUM: 137 meq/L (ref 137–147)

## 2014-04-28 LAB — MAGNESIUM: MAGNESIUM: 1.7 mg/dL (ref 1.5–2.5)

## 2014-04-28 MED ORDER — LORAZEPAM 2 MG/ML IJ SOLN: 0.5000 mg | INTRAMUSCULAR | Status: DC | PRN

## 2014-04-28 MED ORDER — TORSEMIDE 20 MG PO TABS
40.0000 mg | ORAL_TABLET | Freq: Two times a day (BID) | ORAL | Status: DC
  Administered 2014-04-28 – 2014-05-01 (×6): 40 mg via ORAL
  Filled 2014-04-28 (×8): qty 2

## 2014-04-28 MED ORDER — DOXYCYCLINE HYCLATE 100 MG IV SOLR
100.0000 mg | Freq: Two times a day (BID) | INTRAVENOUS | Status: DC
  Administered 2014-04-28 – 2014-04-29 (×2): 100 mg via INTRAVENOUS
  Filled 2014-04-28 (×4): qty 100

## 2014-04-28 NOTE — Progress Notes (Signed)
Pt placed on CPAP with full face mask with 2L bleed in. Auto titrate with 4cmH2O min and 10cmH2O max. RT will continue to monitor.

## 2014-04-28 NOTE — Evaluation (Signed)
Occupational Therapy Evaluation Patient Details Name: [name] MRN: [id number] DOB: 28-Jan-1946 Today's Date: 04/28/2014    History of Present Illness Pt is a 68 y.o. female with past medical history of asthma, COPD, sarcoidosis, diabetes type 2, CAD, ESRD, OSA, diastolic congestive heart failure, who presents with worsening leg edema and shortness of breath.    Clinical Impression   Pt admitted with above. Pt independent with ADLs, PTA. Feel pt will benefit from acute OT to increase independence prior to d/c.     Follow Up Recommendations  No OT follow up;Supervision - Intermittent    Equipment Recommendations  None recommended by OT    Recommendations for Other Services       Precautions / Restrictions Precautions Precautions: Fall Restrictions Weight Bearing Restrictions: No      Mobility Bed Mobility               General bed mobility comments: not assessed  Transfers Overall transfer level: Needs assistance   Transfers: Sit to/from Stand Sit to Stand: Min guard;Min assist         General transfer comment: Initially lost balance and sat back in chair on first stand attempt. Pt did better after that.    Balance                                            ADL Overall ADL's : Needs assistance/impaired     Grooming: Wash/dry face;Standing;Set up;Supervision/safety               Lower Body Dressing: Min guard;Sit to/from stand   Toilet Transfer: Min guard;Ambulation;RW (chair)           Functional mobility during ADLs: Min guard;Rolling walker General ADL Comments: Educated on energy conservation techniques. Educated on safety tips for home (safe shoewear, use of bag on walker, sitting for LB ADLs).  Breaks taken during session.     Vision                     Perception     Praxis      Pertinent Vitals/Pain Pain Assessment: No/denies pain; O2 remained in 90's on 2 L of O2.     Hand Dominance  Right   Extremity/Trunk Assessment Upper Extremity Assessment Upper Extremity Assessment: Overall WFL for tasks assessed   Lower Extremity Assessment Lower Extremity Assessment: Defer to PT evaluation       Communication Communication Communication: No difficulties   Cognition Arousal/Alertness: Awake/alert Behavior During Therapy: WFL for tasks assessed/performed Overall Cognitive Status: Within Functional Limits for tasks assessed                     General Comments       Exercises       Shoulder Instructions      Home Living Family/patient expects to be discharged to:: Private residence Living Arrangements: Alone Available Help at Discharge: Family;Available 24 hours/day Type of Home: Apartment Home Access: Level entry     Home Layout: One level     Bathroom Shower/Tub: Teacher, early years/pre: Handicapped height     Home Equipment: Tub bench;Grab bars - toilet          Prior Functioning/Environment Level of Independence: Independent with assistive device(s)             OT Diagnosis: Other (comment);Generalized  weakness (decreased activity tolerance)   OT Problem List: Decreased activity tolerance;Decreased knowledge of use of DME or AE;Decreased knowledge of precautions;Cardiopulmonary status limiting activity;Decreased strength   OT Treatment/Interventions: Self-care/ADL training;Therapeutic exercise;Energy conservation;DME and/or AE instruction;Therapeutic activities;Patient/family education;Balance training    OT Goals(Current goals can be found in the care plan section) Acute Rehab OT Goals Patient Stated Goal: be able to walk outside OT Goal Formulation: With patient Time For Goal Achievement: 05/05/14 Potential to Achieve Goals: Good ADL Goals Pt Will Perform Upper Body Bathing: with modified independence;sitting;standing Pt Will Perform Lower Body Bathing: with modified independence;sit to/from stand Pt Will Transfer  to Toilet: with modified independence;ambulating Additional ADL Goal #1: Pt will independently verbalize and demonstrate 3/3 energy conservation techniques.  OT Frequency: Min 2X/week   Barriers to D/C:            Co-evaluation              End of Session Equipment Utilized During Treatment: Gait belt;Rolling walker; Oxygen  Activity Tolerance: Patient tolerated treatment well Patient left: in chair;with call bell/phone within reach   Time: 1131-1156 OT Time Calculation (min): 25 min Charges:  OT General Charges $OT Visit: 1 Procedure OT Evaluation $Initial OT Evaluation Tier I: 1 Procedure OT Treatments $Self Care/Home Management : 8-22 mins G-CodesBenito Mccreedy OTR/L [phone] 04/28/2014, 1:19 PM

## 2014-04-28 NOTE — Progress Notes (Signed)
Patient was complaining of pain at her IV site, IV was infiltrated. Tried two times to get another IV and paged IV team. Will wait for IV team

## 2014-04-28 NOTE — Progress Notes (Addendum)
[facility] Progress Note   [name] NLZ:[id number] DOB: [birth year] DOA: 04/26/2014 PCP: Antony Blackbird, MD  Brief narrative: [name] is a 68 y.o. female for dyspnea and increasing pedal edema. She had doubled he Torsemide from 40 daily to BID at home but despite this remained short of breath. Initially thought to be fluid overloaded but now is coughing up green sputum- severe cough started overnight. Has barely had any urine output on a Lasix infusion.   Subjective: Significant amount of cough with green sputum today. No fevers or chest pain.   Assessment/Plan: Principal Problem:   Acute respiratory failure with hypoxia (a)- acute on chronic diastolic CHF?  initially though to be a CHF exacerbation- cardiology consulted and recommended Lasix infusion but has not had much diuresis with Lasix infusion which has now expired- will resume her home dose of Torsemide  (b) acute bronchitis vs pneumonia - began to cough up green sputum overnight- will start Doxycycline- allergic to Quinolones-  sputum culture ordered - obtain CT chest to look for pneumonia- (without contrast)  (c) sarcoidosis- may need to increase Prednisone- will follow for improvement with antibiotics prior to increasing steroids-   (d) obesity hypoventilation? 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   Code Status: Full code Family Communication: none Disposition Plan: home DVT prophylaxis:heparin  Consultants: cardiology  Procedures: none  Antibiotics: Anti-infectives   Start     Dose/Rate Route Frequency Ordered Stop   04/28/14 1000  doxycycline (VIBRAMYCIN) 100 mg in dextrose 5 % 250 mL  IVPB     100 mg 125 mL/hr over 120 Minutes Intravenous Every 12 hours 04/28/14 0758           Objective: Filed Weights   04/27/14 0402 04/28/14 0420  Weight: 102.876 kg (226 lb 12.8 oz) 103.5 kg (228 lb 2.8 oz)    Intake/Output Summary (Last 24 hours) at 04/28/14 0950 Last data filed at 04/28/14 9024  Gross per 24 hour  Intake    660 ml  Output    600 ml  Net     60 ml     Vitals Filed Vitals:   04/27/14 2142 04/28/14 0131 04/28/14 0420 04/28/14 0750  BP:  121/36 131/51   Pulse:  79 83   Temp:  98 F (36.7 C) 98.4 F (36.9 C)   TempSrc:  Oral Oral   Resp:  20 18   Height:      Weight:   103.5 kg (228 lb 2.8 oz)   SpO2: 100% 99% 100% 100%    Exam: General: No acute respiratory distress- morbidly obese Lungs: coarse breath sounds at bases Cardiovascular: Regular rate and rhythm without murmur gallop or rub normal S1 and S2 Abdomen: Nontender, nondistended, soft, bowel sounds positive, no rebound, no ascites, no appreciable mass Extremities: No significant cyanosis, clubbing, or edema bilateral lower extremities  Data Reviewed: Basic Metabolic Panel:  Recent Labs Lab 04/26/14 2358 04/27/14 0540 04/28/14 0424  NA 138 140 137  K 4.2 4.0 4.3  CL 99 102 99  CO2 23 24 24   GLUCOSE 228* 187* 154*  BUN 59* 60* 64*  CREATININE 3.06* 3.06*  3.38*  CALCIUM 9.0 9.1 9.0  MG  --  1.5 1.7   Liver Function Tests: No results found for this basename: AST, ALT, ALKPHOS, BILITOT, PROT, ALBUMIN,  in the last 168 hours No results found for this basename: LIPASE, AMYLASE,  in the last 168 hours No results found for this basename: AMMONIA,  in the last 168 hours CBC:  Recent Labs Lab 04/26/14 2358  WBC 12.1*  HGB 10.5*  HCT 33.2*  MCV 90.7  PLT 483*   Cardiac Enzymes:  Recent Labs Lab 04/26/14 2358 04/27/14 0540 04/27/14 1123 04/27/14 1550  TROPONINI <0.30 <0.30 <0.30 <0.30   BNP (last 3 results)  Recent Labs  04/26/14 2358  PROBNP 3725.0*    CBG:  Recent Labs Lab 04/27/14 1826 04/27/14 1951 04/28/14 0001 04/28/14 0415 04/28/14 0804  GLUCAP 256* 240* 197* 168* 119*    No results found for this or any previous visit (from the past 240 hour(s)).   Studies:  Recent x-ray studies have been reviewed in detail by the Attending Physician  Scheduled Meds:  Scheduled Meds: . acidophilus  1 capsule Oral Daily  . albuterol  2.5 mg Nebulization BID  . Apremilast  30 mg Oral Daily  . aspirin EC  81 mg Oral Daily  . atorvastatin  20 mg Oral QHS  . cloNIDine  0.1 mg Oral BID  . doxycycline (VIBRAMYCIN) IV  100 mg Intravenous Q12H  . ferrous sulfate  325 mg Oral Q breakfast  . fluticasone  2 spray Each Nare QHS  . heparin  5,000 Units Subcutaneous 3 times per day  . hydrALAZINE  10 mg Oral 3 times per day  . Influenza vac split quadrivalent PF  0.5 mL Intramuscular Tomorrow-1000  . insulin aspart  0-15 Units Subcutaneous 6 times per day  . insulin glargine  40 Units Subcutaneous BID  . ipratropium  0.5 mg Nebulization BID  . isosorbide dinitrate  20 mg Oral TID  . ketorolac  1 drop Both Eyes TID  . Linaclotide  290 mcg Oral QODAY  . metoCLOPramide  10 mg Oral QHS  . metoprolol tartrate  12.5 mg Oral BID  . multivitamin with minerals  1 tablet Oral Daily  . pantoprazole  40 mg Oral Daily  . pneumococcal 23 valent vaccine  0.5 mL Intramuscular Tomorrow-1000  . predniSONE  5 mg Oral Q breakfast  . pregabalin  100 mg Oral QPM  . sodium chloride  3 mL Intravenous Q12H  . sodium chloride  3 mL Intravenous Q12H   Continuous Infusions:   Time spent on care of this patient: 35 min   [place], MD 04/28/2014, 9:50 AM  LOS: 2 days   [facility] Office  [phone] Pager - Text Page per [email or web address]  If 7PM-7AM, please contact night-coverage [email or web address]

## 2014-04-28 NOTE — Progress Notes (Signed)
Primary cardiologist: Dr. Loralie Champagne - CHF clinic (not seen recently)  Subjective:   Still short of breath with activity, no chest pain. Tells me that her weight went up over 10 pounds in the span of a week with no other obvious change in diet or medications.   Objective:   Temp:  [97.8 F (36.6 C)-98.7 F (37.1 C)] 98.4 F (36.9 C) (09/27 0420) Pulse Rate:  [70-83] 83 (09/27 0420) Resp:  [18-20] 18 (09/27 0420) BP: (111-131)/(36-51) 131/51 mmHg (09/27 0420) SpO2:  [99 %-100 %] 100 % (09/27 0750) FiO2 (%):  [28 %] 28 % (09/27 0750) Weight:  [228 lb 2.8 oz (103.5 kg)] 228 lb 2.8 oz (103.5 kg) (09/27 0420) Last BM Date: 04/27/14  Filed Weights   04/27/14 0402 04/28/14 0420  Weight: 226 lb 12.8 oz (102.876 kg) 228 lb 2.8 oz (103.5 kg)    Intake/Output Summary (Last 24 hours) at 04/28/14 0956 Last data filed at 04/28/14 0643  Gross per 24 hour  Intake    660 ml  Output    600 ml  Net     60 ml    Telemetry: Sinus rhythm.  Exam:  General: Morbidly obese.  Lungs: Decreased breath sounds, no rales.  Cardiac: Regular, no gallop. Difficult to assess JVP.  Abdomen: Protuberant.  Extremities: 1+ edema.   Lab Results:  Basic Metabolic Panel:  Recent Labs Lab 04/26/14 2358 04/27/14 0540 04/28/14 0424  NA 138 140 137  K 4.2 4.0 4.3  CL 99 102 99  CO2 23 24 24   GLUCOSE 228* 187* 154*  BUN 59* 60* 64*  CREATININE 3.06* 3.06* 3.38*  CALCIUM 9.0 9.1 9.0  MG  --  1.5 1.7    CBC:  Recent Labs Lab 04/26/14 2358  WBC 12.1*  HGB 10.5*  HCT 33.2*  MCV 90.7  PLT 483*    Cardiac Enzymes:  Recent Labs Lab 04/27/14 0540 04/27/14 1123 04/27/14 1550  TROPONINI <0.30 <0.30 <0.30    BNP:  Recent Labs  04/26/14 2358  PROBNP 3725.0*    Echocardiogram (04/27/14):  Study Conclusions  - Left ventricle: The cavity size was normal. There was mild concentric hypertrophy. Systolic function was normal. The estimated ejection fraction was in the  range of 55% to 60%. Wall motion was normal; there were no regional wall motion abnormalities. - Aortic valve: Trileaflet; mildly thickened, mildly calcified leaflets. Valve area (VTI): 2 cm^2. Valve area (Vmax): 2.02 cm^2. Valve area (Vmean): 1.74 cm^2. - Aortic root: The aortic root was normal in size. - Left atrium: The atrium was mildly dilated. - Right ventricle: The cavity size was mildly dilated. Wall thickness was normal. Systolic function was normal. - Tricuspid valve: There was no regurgitation. - Pulmonary arteries: PA peak pressure: 32 mm Hg (S). - Inferior vena cava: The vessel was dilated. The respirophasic diameter changes were blunted (< 50%), consistent with elevated central venous pressure. - Pericardium, extracardiac: There was no pericardial effusion.  Impressions:  - Normal biventricular systolic function. No significant valvular abnormalities.   Medications:   Scheduled Medications: . acidophilus  1 capsule Oral Daily  . albuterol  2.5 mg Nebulization BID  . Apremilast  30 mg Oral Daily  . aspirin EC  81 mg Oral Daily  . atorvastatin  20 mg Oral QHS  . cloNIDine  0.1 mg Oral BID  . doxycycline (VIBRAMYCIN) IV  100 mg Intravenous Q12H  . ferrous sulfate  325 mg Oral Q breakfast  . fluticasone  2 spray Each Nare QHS  . heparin  5,000 Units Subcutaneous 3 times per day  . hydrALAZINE  10 mg Oral 3 times per day  . Influenza vac split quadrivalent PF  0.5 mL Intramuscular Tomorrow-1000  . insulin aspart  0-15 Units Subcutaneous 6 times per day  . insulin glargine  40 Units Subcutaneous BID  . ipratropium  0.5 mg Nebulization BID  . isosorbide dinitrate  20 mg Oral TID  . ketorolac  1 drop Both Eyes TID  . Linaclotide  290 mcg Oral QODAY  . metoCLOPramide  10 mg Oral QHS  . metoprolol tartrate  12.5 mg Oral BID  . multivitamin with minerals  1 tablet Oral Daily  . pantoprazole  40 mg Oral Daily  . pneumococcal 23 valent vaccine  0.5 mL Intramuscular  Tomorrow-1000  . predniSONE  5 mg Oral Q breakfast  . pregabalin  100 mg Oral QPM  . sodium chloride  3 mL Intravenous Q12H  . sodium chloride  3 mL Intravenous Q12H     PRN Medications: sodium chloride, acetaminophen, albuterol, dicyclomine, guaiFENesin, hydrOXYzine, LORazepam, LORazepam, oxyCODONE-acetaminophen, sodium chloride, traMADol, triamcinolone ointment   Assessment:   1. Increasing shortness of breath and weight gain over the last week, suspect acute on chronic diastolic heart failure. Followup echocardiogram noted above. This is complicated by CKD stage IV. Patient had already increased her home dose of Demadex without improvement, currently on Lasix drip. Intake and output are not accurate. Weight not coming down as yet.  2. COPD with chronic respiratory failure, also component of shortness of breath.  3. Morbid obesity.  4. Sarcoidosis.  5. OSA on CPAP.  6. CKD stage IV, patient already has left arm AV fistula (Dr. Oneida Alar).   Plan/Discussion:    Continue Lasix infusion for now. Blood pressure stable on hydralazine, Isordil, and Lopressor. May need to get nephrology involved. Would also suggest followup by the CHF team.   Satira Sark, M.D., F.A.C.C.

## 2014-04-28 NOTE — Plan of Care (Signed)
Problem: Phase I Progression Outcomes Goal: Up in chair, BRP Outcome: Progressing Tolerating being up in chair.  Ambulates to bathroom with minimal assistance.

## 2014-04-28 NOTE — Progress Notes (Signed)
Utilization Review Completed.   Brenee Gajda, RN, BSN Nurse Case Manager  

## 2014-04-28 NOTE — Plan of Care (Signed)
Problem: Phase I Progression Outcomes Goal: Pain controlled with appropriate interventions Outcome: Progressing Medicated as ordered

## 2014-04-29 DIAGNOSIS — J9620 Acute and chronic respiratory failure, unspecified whether with hypoxia or hypercapnia: Secondary | ICD-10-CM | POA: Diagnosis present

## 2014-04-29 DIAGNOSIS — J209 Acute bronchitis, unspecified: Secondary | ICD-10-CM

## 2014-04-29 DIAGNOSIS — N179 Acute kidney failure, unspecified: Secondary | ICD-10-CM

## 2014-04-29 DIAGNOSIS — N189 Chronic kidney disease, unspecified: Secondary | ICD-10-CM

## 2014-04-29 LAB — URINE MICROSCOPIC-ADD ON

## 2014-04-29 LAB — EXPECTORATED SPUTUM ASSESSMENT W GRAM STAIN, RFLX TO RESP C

## 2014-04-29 LAB — BASIC METABOLIC PANEL
Anion gap: 14 (ref 5–15)
BUN: 70 mg/dL — ABNORMAL HIGH (ref 6–23)
CHLORIDE: 97 meq/L (ref 96–112)
CO2: 25 meq/L (ref 19–32)
CREATININE: 3.82 mg/dL — AB (ref 0.50–1.10)
Calcium: 8.8 mg/dL (ref 8.4–10.5)
GFR calc Af Amer: 13 mL/min — ABNORMAL LOW (ref >90)
GFR calc non Af Amer: 11 mL/min — ABNORMAL LOW (ref >90)
Glucose, Bld: 64 mg/dL — ABNORMAL LOW (ref 70–99)
Potassium: 4.2 mEq/L (ref 3.7–5.3)
Sodium: 136 mEq/L — ABNORMAL LOW (ref 137–147)

## 2014-04-29 LAB — GLUCOSE, CAPILLARY
GLUCOSE-CAPILLARY: 68 mg/dL — AB (ref 70–99)
GLUCOSE-CAPILLARY: 135 mg/dL — AB (ref 70–99)
GLUCOSE-CAPILLARY: 231 mg/dL — AB (ref 70–99)
Glucose-Capillary: 123 mg/dL — ABNORMAL HIGH (ref 70–99)
Glucose-Capillary: 132 mg/dL — ABNORMAL HIGH (ref 70–99)
Glucose-Capillary: 297 mg/dL — ABNORMAL HIGH (ref 70–99)
Glucose-Capillary: 207 mg/dL — ABNORMAL HIGH (ref 70–99)

## 2014-04-29 LAB — EXPECTORATED SPUTUM ASSESSMENT W REFEX TO RESP CULTURE

## 2014-04-29 LAB — URINALYSIS, ROUTINE W REFLEX MICROSCOPIC
BILIRUBIN URINE: NEGATIVE
Glucose, UA: NEGATIVE mg/dL
HGB URINE DIPSTICK: NEGATIVE
KETONES UR: NEGATIVE mg/dL
Leukocytes, UA: NEGATIVE
NITRITE: NEGATIVE
Protein, ur: 100 mg/dL — AB
Specific Gravity, Urine: 1.009 (ref 1.005–1.030)
Urobilinogen, UA: 0.2 mg/dL (ref 0.0–1.0)
pH: 5 (ref 5.0–8.0)

## 2014-04-29 LAB — MAGNESIUM: Magnesium: 1.6 mg/dL (ref 1.5–2.5)

## 2014-04-29 MED ORDER — DEXTROSE 5 % IV SOLN
10.0000 mg/h | INTRAVENOUS | Status: AC
  Administered 2014-04-29: 10 mg/h via INTRAVENOUS
  Filled 2014-04-29: qty 25

## 2014-04-29 MED ORDER — DOXYCYCLINE HYCLATE 100 MG PO TABS
100.0000 mg | ORAL_TABLET | Freq: Two times a day (BID) | ORAL | Status: DC
  Administered 2014-04-29 – 2014-05-01 (×4): 100 mg via ORAL
  Filled 2014-04-29 (×5): qty 1

## 2014-04-29 MED ORDER — METOLAZONE 2.5 MG PO TABS
2.5000 mg | ORAL_TABLET | Freq: Two times a day (BID) | ORAL | Status: DC
  Administered 2014-04-29: 2.5 mg via ORAL
  Filled 2014-04-29 (×3): qty 1

## 2014-04-29 MED ORDER — ADULT MULTIVITAMIN W/MINERALS CH
1.0000 | ORAL_TABLET | Freq: Every day | ORAL | Status: DC
  Administered 2014-04-30 – 2014-05-01 (×2): 1 via ORAL
  Filled 2014-04-29 (×5): qty 1

## 2014-04-29 NOTE — Progress Notes (Signed)
Patient seen and personally examined and agree with note as outlined by Almyra Deforest, PA-C.  Her symptoms are mainly SOB with cough and Chest CT was unremarkable.  This appears to be primarily acute bronchitis with mild CHF exacerbation.  She has no LE edema on exam today and not crackles on exam. She is net neg with urine output.   At this time I would D/C IV Lasix and place back on home dose of Demadex as her creatinine has bumped.

## 2014-04-29 NOTE — Progress Notes (Signed)
Physical Therapy Treatment Patient Details Name: [name] MRN: [id number] DOB: [birth year] Today's Date: 04/29/2014    History of Present Illness Pt is a 68 y.o. female with past medical history of asthma, COPD, sarcoidosis, diabetes type 2, CAD, ESRD, OSA, diastolic congestive heart failure, who presents with worsening leg edema and shortness of breath.     PT Comments    Pt progressing towards physical therapy goals. Does well with the RW for energy conservation and demonstrates good technique with pursed-lip breathing.   SATURATION QUALIFICATIONS: (This note is used to comply with regulatory documentation for home oxygen)  Patient Saturations on Room Air at Rest = 92%  Patient Saturations on Room Air while Ambulating = 87%  Please briefly explain why patient needs home oxygen: Decreased tolerance for functional activity, demonstrated by dropping O2 sats on RA in addition to need for seated rest break during gait training due to DOE.   Follow Up Recommendations  Home health PT;Supervision for mobility/OOB     Equipment Recommendations  None recommended by PT    Recommendations for Other Services       Precautions / Restrictions Precautions Precautions: Fall Restrictions Weight Bearing Restrictions: No    Mobility  Bed Mobility               General bed mobility comments: Pt sitting on BSC when PT entered.  Transfers Overall transfer level: Needs assistance Equipment used: Rolling walker (2 wheeled);None Transfers: Sit to/from Stand Sit to Stand: Supervision         General transfer comment: Pt able to stand at [facility] to perform peri-care without assist or AD. Supervision for safety, however no LOB.   Ambulation/Gait Ambulation/Gait assistance: Min guard Ambulation Distance (Feet): 100 Feet Assistive device: Rolling walker (2 wheeled);None Gait Pattern/deviations: Step-through pattern;Decreased stride length;Trendelenburg Gait velocity:  Decreased Gait velocity interpretation: Below normal speed for age/gender General Gait Details: Pt ambulated on RA ~100 feet. O2 sats remained at 94% and then decreased to 87%. Pt able to improve sats to 90% with pursed-lip breathing. 1 seated rest break once back in the room.    Stairs            Wheelchair Mobility    Modified Rankin (Stroke Patients Only)       Balance Overall balance assessment: Needs assistance Sitting-balance support: Feet supported;No upper extremity supported Sitting balance-Leahy Scale: Good     Standing balance support: No upper extremity supported Standing balance-Leahy Scale: Fair                      Cognition Arousal/Alertness: Awake/alert Behavior During Therapy: WFL for tasks assessed/performed Overall Cognitive Status: Within Functional Limits for tasks assessed                      Exercises      General Comments        Pertinent Vitals/Pain Pain Assessment: No/denies pain    Home Living                      Prior Function            PT Goals (current goals can now be found in the care plan section) Acute Rehab PT Goals Patient Stated Goal: be able to walk outside PT Goal Formulation: With patient Time For Goal Achievement: 05/04/14 Potential to Achieve Goals: Good    Frequency  Min 3X/week    PT Plan Current plan remains  appropriate    Co-evaluation             End of Session Equipment Utilized During Treatment: Gait belt;Oxygen Activity Tolerance: Patient tolerated treatment well Patient left: in chair;with call bell/phone within reach     Time: 1206-1231 PT Time Calculation (min): 25 min  Charges:  $Gait Training: 8-22 mins $Therapeutic Activity: 8-22 mins                    G Codes:      Jolyn Lent 2014-05-14, 1:34 PM  Jolyn Lent, PT, DPT Acute Rehabilitation Services Pager: [phone]

## 2014-04-29 NOTE — Progress Notes (Signed)
Inpatient Diabetes Program Recommendations  AACE/ADA: New Consensus Statement on Inpatient Glycemic Control (2013)  Target Ranges:  Prepandial:   less than 140 mg/dL      Peak postprandial:   less than 180 mg/dL (1-2 hours)      Critically ill patients:  140 - 180 mg/dL   Inpatient Diabetes Program Recommendations Correction (SSI): change to TID + HS  Insulin - Meal Coverage: consider adding 5 units TID with meals per Glycemic Control order Set (should not be given if eats <50% of meal) Diet: add carbohydrate modified to current low sodium diet  Thank you  Raoul Pitch BSN, RN,CDE Inpatient Diabetes Coordinator [phone] (team pager)

## 2014-04-29 NOTE — Progress Notes (Signed)
[facility] Progress Note   Dominick A Marijo File PJA:[id number] DOB: 01-Jul-1946 DOA: 04/26/2014 PCP: Antony Blackbird, MD  Brief narrative: [name] is a 68 y.o. female for dyspnea and increasing pedal edema. She had doubled he Torsemide from 40 daily to BID at home but despite this remained short of breath. Initially thought to be fluid overloaded but now is coughing up green sputum. Has barely had any urine output on a Lasix infusion.   Subjective: Cough improving- was able to ambulate in the hall yesterday.   Assessment/Plan: Principal Problem:   Acute respiratory failure with hypoxia (a)- acute on chronic diastolic CHF?  initially though to be a CHF exacerbation- cardiology consulted - will allow them to manage   (b) acute bronchitis vs pneumonia - began to cough up green sputum overnight on 9/26t-  started Doxycycline- allergic to Quinolones-  sputum culture ordered - obtained CT chest with out contrast to look for pneumonia- this reveals clear lungs- no pneumonia or pulmonary edema- suspect most of her sym  (c) sarcoidosis- may need to increase Prednisone- will follow for improvement with antibiotics prior to increasing steroids-   (d) obesity hypoventilation? 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   Code Status: Full code Family Communication: none Disposition Plan: home DVT prophylaxis:heparin  Consultants: cardiology  Procedures: none  Antibiotics: Anti-infectives   Start     Dose/Rate Route Frequency Ordered Stop   04/28/14 1000  doxycycline (VIBRAMYCIN) 100 mg in dextrose 5 % 250 mL IVPB     100 mg 125 mL/hr over 120 Minutes Intravenous Every 12 hours  04/28/14 0758           Objective: Filed Weights   04/27/14 0402 04/28/14 0420 04/29/14 0432  Weight: 102.876 kg (226 lb 12.8 oz) 103.5 kg (228 lb 2.8 oz) 104 kg (229 lb 4.5 oz)    Intake/Output Summary (Last 24 hours) at 04/29/14 0952 Last data filed at 04/29/14 0939  Gross per 24 hour  Intake    720 ml  Output   1600 ml  Net   -880 ml     Vitals Filed Vitals:   04/28/14 2316 04/29/14 0432 04/29/14 0852 04/29/14 0930  BP:  107/40  106/39  Pulse: 68 65  78  Temp:  98.5 F (36.9 C)  97.9 F (36.6 C)  TempSrc:  Oral  Oral  Resp: 18 18  20   Height:      Weight:  104 kg (229 lb 4.5 oz)    SpO2: 97% 97% 99% 93%    Exam: General: No acute respiratory distress- morbidly obese Lungs: coarse breath sounds at bases Cardiovascular: Regular rate and rhythm without murmur gallop or rub normal S1 and S2 Abdomen: Nontender, nondistended, soft, bowel sounds positive, no rebound, no ascites, no appreciable mass Extremities: No significant cyanosis, clubbing, or edema bilateral lower extremities  Data Reviewed: Basic Metabolic Panel:  Recent Labs Lab 04/26/14 2358 04/27/14 0540 04/28/14 0424 04/29/14 0419  NA 138 140 137 136*  K 4.2 4.0 4.3 4.2  CL 99 102 99 97  CO2 23 24 24 25   GLUCOSE 228* 187* 154* 64*  BUN 59* 60*  64* 70*  CREATININE 3.06* 3.06* 3.38* 3.82*  CALCIUM 9.0 9.1 9.0 8.8  MG  --  1.5 1.7 1.6   Liver Function Tests: No results found for this basename: AST, ALT, ALKPHOS, BILITOT, PROT, ALBUMIN,  in the last 168 hours No results found for this basename: LIPASE, AMYLASE,  in the last 168 hours No results found for this basename: AMMONIA,  in the last 168 hours CBC:  Recent Labs Lab 04/26/14 2358  WBC 12.1*  HGB 10.5*  HCT 33.2*  MCV 90.7  PLT 483*   Cardiac Enzymes:  Recent Labs Lab 04/26/14 2358 04/27/14 0540 04/27/14 1123 04/27/14 1550  TROPONINI <0.30 <0.30 <0.30 <0.30   BNP (last 3 results)  Recent Labs  04/26/14 2358  PROBNP  3725.0*   CBG:  Recent Labs Lab 04/28/14 2009 04/29/14 0114 04/29/14 0433 04/29/14 0516 04/29/14 0806  GLUCAP 269* 123* 68* 132* 135*    Recent Results (from the past 240 hour(s))  CULTURE, EXPECTORATED SPUTUM-ASSESSMENT     Status: None   Collection Time    04/29/14  1:49 AM      Result Value Ref Range Status   Specimen Description SPUTUM   Final   Special Requests Immunocompromised   Final   Sputum evaluation     Final   Value: MICROSCOPIC FINDINGS SUGGEST THAT THIS SPECIMEN IS NOT REPRESENTATIVE OF LOWER RESPIRATORY SECRETIONS. PLEASE RECOLLECT.     CALLED TO J.Mel Almond RN [phone] 04/29/14 E.GADDY   Report Status 04/29/2014 FINAL   Final     Studies:  Recent x-ray studies have been reviewed in detail by the Attending Physician  Scheduled Meds:  Scheduled Meds: . acidophilus  1 capsule Oral Daily  . albuterol  2.5 mg Nebulization BID  . Apremilast  30 mg Oral Daily  . aspirin EC  81 mg Oral Daily  . atorvastatin  20 mg Oral QHS  . cloNIDine  0.1 mg Oral BID  . doxycycline (VIBRAMYCIN) IV  100 mg Intravenous Q12H  . ferrous sulfate  325 mg Oral Q breakfast  . fluticasone  2 spray Each Nare QHS  . heparin  5,000 Units Subcutaneous 3 times per day  . hydrALAZINE  10 mg Oral 3 times per day  . insulin aspart  0-15 Units Subcutaneous 6 times per day  . insulin glargine  40 Units Subcutaneous BID  . ipratropium  0.5 mg Nebulization BID  . isosorbide dinitrate  20 mg Oral TID  . ketorolac  1 drop Both Eyes TID  . Linaclotide  290 mcg Oral QODAY  . metoCLOPramide  10 mg Oral QHS  . metoprolol tartrate  12.5 mg Oral BID  . multivitamin with minerals  1 tablet Oral Daily  . pantoprazole  40 mg Oral Daily  . predniSONE  5 mg Oral Q breakfast  . pregabalin  100 mg Oral QPM  . sodium chloride  3 mL Intravenous Q12H  . sodium chloride  3 mL Intravenous Q12H  . torsemide  40 mg Oral BID   Continuous Infusions: . furosemide (LASIX) infusion 10 mg/hr (04/29/14 0834)     Time spent on care of this patient: 35 min   [place], MD 04/29/2014, 9:52 AM  LOS: 3 days   [facility] Office  [phone] Pager - Text Page per [email or web address]  If 7PM-7AM, please contact night-coverage [email or web address]

## 2014-04-29 NOTE — Progress Notes (Signed)
Pt awake and watching TV .  Call bell at reach.  Instructed to call for assistance as needed.  Verbalized understanding.  Will continue to monitor.  Tonishia Steffy,RN,

## 2014-04-29 NOTE — Progress Notes (Signed)
Hypoglycemic Event  CBG: 68 Treatment: 15 GM carbohydrate snack orange juice and graham crackers with peanut butter  Symptoms: None  Follow-up CBG: [id number] CBG Result:132 Possible Reasons for Event: Unknown  Comments/MD notified: glucometer was not docked right away so 132 sugar result came up at a later time then was actually drawn.    Janalyn Rouse V  Remember to initiate Hypoglycemia Order Set & complete

## 2014-04-29 NOTE — Care Management Note (Addendum)
    Page 1 of 1   05/01/2014     2:47:38 PM CARE MANAGEMENT NOTE 05/01/2014  Patient:  [name], [name]   Account Number:  [id number]  Date Initiated:  04/29/2014  Documentation initiated by:  [facility]  Subjective/Objective Assessment:   68 y.o. female with past medical history of asthma, COPD, sarcoidosis, DM 2, CAD, ESRD, OSA, diastolic CHF, who presents with worsening leg edema and SOB. //Home alone.     Action/Plan:   -will admit to Telemetry bed.  -will treat with IV lasix 120 mg Bid// Access for disposition needs.   Anticipated DC Date:  05/01/2014   Anticipated DC Plan:  [place]  CM consult      Choice offered to / List presented to:          [facility] arranged  HH-1 RN  [facility]      [facility] agency  [facility]   Status of service:  Completed, signed off Medicare Important Message given?  YES (If response is "NO", the following Medicare IM given date fields will be blank) Date Medicare IM given:  04/29/2014 Medicare IM given by:  [facility] Date Additional Medicare IM given:   Additional Medicare IM given by:    Discharge Disposition:    Per UR Regulation:  Reviewed for med. necessity/level of care/duration of stay  If discussed at [place] of Stay Meetings, dates discussed:    Comments:  05/01/14 Lee, RN, BSN, [employer] [phone] Spoke with pt at bedside regarding discharge planning for [facility]. Offered pt list of home health agencies to choose from.  Pt chose [facility] to render services. Christa See, RN of [facility] notified.  No DME needs identified at this time.

## 2014-04-29 NOTE — Progress Notes (Signed)
Patient has refused the CPAP machine for tonight. Patient is currently on 2 lpm [place] with o2 sats 99%, in no apparent distress. CPAP is still set up at bedside ready to be used if patient does happen to change her mind. Patient is aware to call if she does change her mind about wearing the machine tonight. RT will continue to assist as needed.

## 2014-04-29 NOTE — Progress Notes (Signed)
Patient Name: [name] Date of Encounter: 04/29/2014     Principal Problem:   Acute respiratory failure with hypoxia Active Problems:   HYPERTENSION   CAD   ASTHMA   Sarcoidosis   Diabetes mellitus with renal manifestation   End stage renal disease   COPD (chronic obstructive pulmonary disease)    SUBJECTIVE  No significant SOB or CP. Feels her stomach is bloating and heavy when she gets up. LE swelling has been worsening for 2.5 weeks. Previous on demadex once a day, recently increased to twice a day.   CURRENT MEDS . acidophilus  1 capsule Oral Daily  . albuterol  2.5 mg Nebulization BID  . Apremilast  30 mg Oral Daily  . aspirin EC  81 mg Oral Daily  . atorvastatin  20 mg Oral QHS  . cloNIDine  0.1 mg Oral BID  . doxycycline (VIBRAMYCIN) IV  100 mg Intravenous Q12H  . ferrous sulfate  325 mg Oral Q breakfast  . fluticasone  2 spray Each Nare QHS  . heparin  5,000 Units Subcutaneous 3 times per day  . hydrALAZINE  10 mg Oral 3 times per day  . insulin aspart  0-15 Units Subcutaneous 6 times per day  . insulin glargine  40 Units Subcutaneous BID  . ipratropium  0.5 mg Nebulization BID  . isosorbide dinitrate  20 mg Oral TID  . ketorolac  1 drop Both Eyes TID  . Linaclotide  290 mcg Oral QODAY  . metoCLOPramide  10 mg Oral QHS  . metolazone  2.5 mg Oral BID  . metoprolol tartrate  12.5 mg Oral BID  . multivitamin with minerals  1 tablet Oral Daily  . pantoprazole  40 mg Oral Daily  . predniSONE  5 mg Oral Q breakfast  . pregabalin  100 mg Oral QPM  . sodium chloride  3 mL Intravenous Q12H  . sodium chloride  3 mL Intravenous Q12H  . torsemide  40 mg Oral BID    OBJECTIVE  Filed Vitals:   04/28/14 2012 04/28/14 2015 04/28/14 2316 04/29/14 0432  BP:  122/44  107/40  Pulse:  74 68 65  Temp:  98.7 F (37.1 C)  98.5 F (36.9 C)  TempSrc:  Oral  Oral  Resp:  20 18 18   Height:      Weight:    229 lb 4.5 oz (104 kg)  SpO2: 100% 96% 97% 97%     Intake/Output Summary (Last 24 hours) at 04/29/14 0847 Last data filed at 04/29/14 0446  Gross per 24 hour  Intake    480 ml  Output   1600 ml  Net  -1120 ml   Filed Weights   04/27/14 0402 04/28/14 0420 04/29/14 0432  Weight: 226 lb 12.8 oz (102.876 kg) 228 lb 2.8 oz (103.5 kg) 229 lb 4.5 oz (104 kg)    PHYSICAL EXAM  General: Pleasant, NAD. Neuro: Alert and oriented X 3. Moves all extremities spontaneously. Psych: Normal affect. HEENT:  Normal  Neck: Supple without bruits or JVD. Lungs:  Resp regular and unlabored, CTA. Heart: RRR no s3, s4, or murmurs. Abdomen: Soft, non-tender, non-distended, BS + x 4.  Extremities: No clubbing, cyanosis or edema. DP/PT/Radials 2+ and equal bilaterally.  Accessory Clinical Findings  CBC  Recent Labs  04/26/14 2358  WBC 12.1*  HGB 10.5*  HCT 33.2*  MCV 90.7  PLT 563*   Basic Metabolic Panel  Recent Labs  04/28/14 0424 04/29/14 0419  NA 137  136*  K 4.3 4.2  CL 99 97  CO2 24 25  GLUCOSE 154* 64*  BUN 64* 70*  CREATININE 3.38* 3.82*  CALCIUM 9.0 8.8  MG 1.7 1.6   Cardiac Enzymes  Recent Labs  04/27/14 0540 04/27/14 1123 04/27/14 1550  TROPONINI <0.30 <0.30 <0.30    TELE NSR with HR 70s, 6 beats run of NSVT last night at 6:27 pm    ECG  NSR with HR 80s, low voltage  Echocardiogram 04/27/2014  LV EF: 55% - 60%  ------------------------------------------------------------------- Indications: CHF - 428.0.  ------------------------------------------------------------------- History: PMH: Coronary artery disease. Chronic obstructive pulmonary disease. Risk factors: Hypertension. Dyslipidemia.  ------------------------------------------------------------------- Study Conclusions  - Left ventricle: The cavity size was normal. There was mild concentric hypertrophy. Systolic function was normal. The estimated ejection fraction was in the range of 55% to 60%. Wall motion was normal; there were no  regional wall motion abnormalities. - Aortic valve: Trileaflet; mildly thickened, mildly calcified leaflets. Valve area (VTI): 2 cm^2. Valve area (Vmax): 2.02 cm^2. Valve area (Vmean): 1.74 cm^2. - Aortic root: The aortic root was normal in size. - Left atrium: The atrium was mildly dilated. - Right ventricle: The cavity size was mildly dilated. Wall thickness was normal. Systolic function was normal. - Tricuspid valve: There was no regurgitation. - Pulmonary arteries: PA peak pressure: 32 mm Hg (S). - Inferior vena cava: The vessel was dilated. The respirophasic diameter changes were blunted (< 50%), consistent with elevated central venous pressure. - Pericardium, extracardiac: There was no pericardial effusion.  Impressions:  - Normal biventricular systolic function. No significant valvular abnormalities.       Radiology/Studies  Dg Chest 2 View  04/27/2014   CLINICAL DATA:  Shortness of breath tonight.  EXAM: CHEST  2 VIEW  COMPARISON:  03/28/2014  FINDINGS: Mild cardiac enlargement without vascular congestion. No focal airspace disease or consolidation in the lungs. No blunting of costophrenic angles. No pneumothorax. Mediastinal contours appear intact. Surgical clips in the upper abdomen. No change since prior study.  IMPRESSION: Mild cardiac enlargement.  No evidence of active pulmonary disease.   Electronically Signed   By: Lucienne Capers M.D.   On: 04/27/2014 00:52   Ct Chest Wo Contrast  04/28/2014   CLINICAL DATA:  Productive cough.  History of sarcoid.  EXAM: CT CHEST WITHOUT CONTRAST  TECHNIQUE: Multidetector CT imaging of the chest was performed following the standard protocol without IV contrast.  COMPARISON:  Chest x-ray 04/27/2014.  CT 08/25/2013, 07/21/2012.  FINDINGS: Thoracic aorta normal caliber.  Heart size normal.  Shotty mediastinal lymph nodes. No definite hilar lymph nodes noted on this nonenhanced study. Thoracic esophagus unremarkable.  Large airways  patent. Stable bibasilar atelectasis and/or scarring. No pleural effusion or pneumothorax.  Adrenals are unremarkable. Stable 1.9 cm low density lesion with calcification noted in the right lobe of the liver, unchanged from prior study dated 07/21/2012, and thus most likely benign. Splenectomy. Splenosis. Cholecystectomy. Adrenals unremarkable.  Thyroid unremarkable. No significant axillary adenopathy. Chest wall is intact. No acute bony abnormality. Diffuse degenerative change thoracic spine.  IMPRESSION: 1. No significant mediastinal or hilar adenopathy noted on this nonenhanced scan in this patient with known history of sarcoid. 2. Stable bibasilar atelectasis and/or scarring. No acute abnormality.   Electronically Signed   By: [place]   On: 04/28/2014 11:36    ASSESSMENT AND PLAN  1. Acute on chronic diastolic HF with weight gain and dyspnea  - originally felt to be due to acute diastolic  HF due to recent weight gain  - Echo 04/27/2014 EF 55-60%, no significant valvular dx  - hold metolazone with worsening renal function  - apparently some confusion over whether to continue lasix gtt (as recommended by Dr. Domenic Polite) vs restart home demadex. Cr increasing (as noted below) with diuresis, CT of chest negative for acute process. Pulm exam shows lung essentially clear. ?if patient's symptom is due to acute bronchitis instead of acute on chronic diastolic HF. Metolazone held this morning. Will discuss with Dr. Radford Pax, potentially stop lasix gtt and back on home demadex.   2. Acute bronchitis: continue abx 3. COPD 4. Morbid obesity 5. Sarcoidosis 6. OSA on CPAP 7. CKD stage IV  - Cr 3.06 --> 3.82  Signed, Almyra Deforest PA-C Pager: [phone]

## 2014-04-30 DIAGNOSIS — J9620 Acute and chronic respiratory failure, unspecified whether with hypoxia or hypercapnia: Secondary | ICD-10-CM

## 2014-04-30 DIAGNOSIS — I10 Essential (primary) hypertension: Secondary | ICD-10-CM

## 2014-04-30 DIAGNOSIS — I2510 Atherosclerotic heart disease of native coronary artery without angina pectoris: Secondary | ICD-10-CM

## 2014-04-30 DIAGNOSIS — N186 End stage renal disease: Secondary | ICD-10-CM

## 2014-04-30 LAB — CBC
HEMATOCRIT: 34.2 % — AB (ref 36.0–46.0)
HEMOGLOBIN: 10.4 g/dL — AB (ref 12.0–15.0)
MCH: 28.3 pg (ref 26.0–34.0)
MCHC: 30.4 g/dL (ref 30.0–36.0)
MCV: 92.9 fL (ref 78.0–100.0)
Platelets: 440 10*3/uL — ABNORMAL HIGH (ref 150–400)
RBC: 3.68 MIL/uL — AB (ref 3.87–5.11)
RDW: 14.4 % (ref 11.5–15.5)
WBC: 11.2 10*3/uL — ABNORMAL HIGH (ref 4.0–10.5)

## 2014-04-30 LAB — GLUCOSE, CAPILLARY
GLUCOSE-CAPILLARY: 90 mg/dL (ref 70–99)
GLUCOSE-CAPILLARY: 253 mg/dL — AB (ref 70–99)
Glucose-Capillary: 148 mg/dL — ABNORMAL HIGH (ref 70–99)
Glucose-Capillary: 215 mg/dL — ABNORMAL HIGH (ref 70–99)
Glucose-Capillary: 310 mg/dL — ABNORMAL HIGH (ref 70–99)
Glucose-Capillary: 72 mg/dL (ref 70–99)
Glucose-Capillary: 93 mg/dL (ref 70–99)

## 2014-04-30 LAB — BASIC METABOLIC PANEL
Anion gap: 14 (ref 5–15)
BUN: 78 mg/dL — AB (ref 6–23)
CO2: 25 meq/L (ref 19–32)
CREATININE: 3.61 mg/dL — AB (ref 0.50–1.10)
Calcium: 9.3 mg/dL (ref 8.4–10.5)
Chloride: 94 mEq/L — ABNORMAL LOW (ref 96–112)
GFR calc non Af Amer: 12 mL/min — ABNORMAL LOW (ref >90)
GFR, EST AFRICAN AMERICAN: 14 mL/min — AB (ref >90)
GLUCOSE: 199 mg/dL — AB (ref 70–99)
POTASSIUM: 4.8 meq/L (ref 3.7–5.3)
Sodium: 133 mEq/L — ABNORMAL LOW (ref 137–147)

## 2014-04-30 NOTE — Progress Notes (Signed)
Occupational Therapy Treatment Patient Details Name: [name] MRN: [id number] DOB: 10-29-1945 Today's Date: 04/30/2014    History of present illness Pt is a 68 y.o. female with past medical history of asthma, COPD, sarcoidosis, diabetes type 2, CAD, ESRD, OSA, diastolic congestive heart failure, who presents with worsening leg edema and shortness of breath.    OT comments  Reviewed energy conservation principles with pt further and issued handout. Practiced toilet transfer safety.   Follow Up Recommendations  No OT follow up;Supervision - Intermittent    Equipment Recommendations  None recommended by OT    Recommendations for Other Services      Precautions / Restrictions Precautions Precautions: Fall Restrictions Weight Bearing Restrictions: No       Mobility Bed Mobility   Bed Mobility: Supine to Sit     Supine to sit: Supervision        Transfers Overall transfer level: Needs assistance Equipment used: Rolling walker (2 wheeled) Transfers: Sit to/from Stand Sit to Stand: Supervision              Balance                                   ADL                           Toilet Transfer: Min guard;Ambulation;RW;BSC             General ADL Comments: Pt was able to verbalize 3 energy conservation techniques today. Reviewed more techniques with her and issued handout and went over it with her. Pt's O2 sats on RA with activity is 89% and up to 93-97% with rest and PLB. Reapplied O2 and informed nursing of sats. Pt has a tubbench for home and grab bars next to her higher toilet.       Vision                     Perception     Praxis      Cognition   Behavior During Therapy: WFL for tasks assessed/performed Overall Cognitive Status: Within Functional Limits for tasks assessed                       Extremity/Trunk Assessment               Exercises     Shoulder Instructions        General Comments      Pertinent Vitals/ Pain       Pain Assessment: No/denies pain  Home Living                                          Prior Functioning/Environment              Frequency Min 2X/week     Progress Toward Goals  OT Goals(current goals can now be found in the care plan section)  Progress towards OT goals: Progressing toward goals     Plan Discharge plan remains appropriate    Co-evaluation                 End of Session Equipment Utilized During Treatment: Rolling walker   Activity Tolerance Patient tolerated treatment well   Patient Left in chair;with  call bell/phone within reach   Nurse Communication          Time: [phone] OT Time Calculation (min): 27 min  Charges: OT General Charges $OT Visit: 1 Procedure OT Treatments $Self Care/Home Management : 8-22 mins $Therapeutic Activity: 8-22 mins  Jules Schick [phone] 04/30/2014, 9:15 AM

## 2014-04-30 NOTE — Progress Notes (Addendum)
[facility] Progress Note   [name] VWU:[id number] DOB: 1945-11-03 DOA: 04/26/2014 PCP: Antony Blackbird, MD  Brief narrative: [name] is a 68 y.o. female for dyspnea and increasing pedal edema. She had doubled he Torsemide from 40 daily to BID at home but despite this remained short of breath. Initially thought to be fluid overloaded but now is coughing up green sputum. Has barely had any urine output on a Lasix infusion.   Subjective: Cough improving- sputum now back to white-  Assessment/Plan: Principal Problem:   Acute respiratory failure with hypoxia (a)- acute on chronic diastolic CHF?  initially though to be a CHF exacerbation- cardiology consulted - suspected to be minimal CHF - I agree.   (b) acute bronchitis  - began to cough up green sputum overnight on 9/26-  started Doxycycline- allergic to Quinolones-  sputum culture ordered - obtained CT chest with out contrast to look for pneumonia- this reveals clear lungs- no pneumonia or pulmonary edema- suspect most of her symptoms are related to acute bronchitis.   (c) sarcoidosis- on Prednisone which was just tapered down from 40 mg to 5-  will follow for improvement with antibiotics- hold off on increasing steroids-   (d) obesity hypoventilation? States she continues to gain weight due to recent increase in steroids  (e) OSA-often refusing CPAP at night  Active Problems:    HYPERTENSION - Imdur, Lopressor, Clonidine  CAD  - cont ASA 81 mg and B Blocker   Diabetes mellitus with renal manifestation  - cont Lantus 40 BID, and insulin sliding scale   CKD 4-5 - follow Cr with diuresis   Code Status: Full code Family Communication: none Disposition Plan: home- hopefully tomorrow. -  DVT prophylaxis:heparin  Consultants: cardiology  Procedures: none  Antibiotics: Anti-infectives   Start     Dose/Rate Route Frequency Ordered Stop   04/29/14 1700  doxycycline (VIBRA-TABS) tablet 100 mg      100 mg Oral Every 12 hours 04/29/14 1610     04/28/14 1000  doxycycline (VIBRAMYCIN) 100 mg in dextrose 5 % 250 mL IVPB  Status:  Discontinued     100 mg 125 mL/hr over 120 Minutes Intravenous Every 12 hours 04/28/14 0758 04/29/14 1610         Objective: Filed Weights   04/28/14 0420 04/29/14 0432 04/30/14 0534  Weight: 103.5 kg (228 lb 2.8 oz) 104 kg (229 lb 4.5 oz) 103.692 kg (228 lb 9.6 oz)    Intake/Output Summary (Last 24 hours) at 04/30/14 0944 Last data filed at 04/30/14 0835  Gross per 24 hour  Intake 1313.67 ml  Output   2200 ml  Net -886.33 ml     Vitals Filed Vitals:   04/29/14 1413 04/29/14 2050 04/30/14 0534 04/30/14 0700  BP: 117/39 118/64 114/57 114/45  Pulse: 70 71 61 63  Temp: 98.6 F (37 C) 99.1 F (37.3 C) 98 F (36.7 C)   TempSrc: Oral Oral Oral   Resp: 18 20 18 15   Height:      Weight:   103.692 kg (228 lb 9.6 oz)   SpO2: 97% 100% 100% 98%    Exam: General: No acute respiratory distress- morbidly obese Lungs: coarse breath sounds at bases Cardiovascular: Regular rate and rhythm without murmur gallop or rub normal S1 and S2 Abdomen: Nontender, nondistended, soft, bowel sounds positive, no rebound, no ascites, no appreciable mass Extremities: No significant cyanosis, clubbing, or edema bilateral lower extremities  Data Reviewed: Basic Metabolic Panel:  Recent Labs  Lab 04/26/14 2358 04/27/14 0540 04/28/14 0424 04/29/14 0419  NA 138 140 137 136*  K 4.2 4.0 4.3 4.2  CL 99 102 99 97  CO2 23 24 24 25   GLUCOSE 228* 187* 154* 64*  BUN 59* 60* 64* 70*  CREATININE 3.06* 3.06* 3.38* 3.82*  CALCIUM 9.0 9.1 9.0 8.8  MG  --  1.5 1.7 1.6   Liver Function Tests: No results found for this basename: AST, ALT, ALKPHOS, BILITOT, PROT, ALBUMIN,  in the last 168 hours No results found for this basename: LIPASE, AMYLASE,  in the last 168 hours No results found for this basename: AMMONIA,  in the last 168 hours CBC:  Recent Labs Lab 04/26/14 2358   WBC 12.1*  HGB 10.5*  HCT 33.2*  MCV 90.7  PLT 483*   Cardiac Enzymes:  Recent Labs Lab 04/26/14 2358 04/27/14 0540 04/27/14 1123 04/27/14 1550  TROPONINI <0.30 <0.30 <0.30 <0.30   BNP (last 3 results)  Recent Labs  04/26/14 2358  PROBNP 3725.0*   CBG:  Recent Labs Lab 04/29/14 1958 04/30/14 0009 04/30/14 0401 04/30/14 0557 04/30/14 0757  GLUCAP 231* 148* 90 72 93    Recent Results (from the past 240 hour(s))  CULTURE, EXPECTORATED SPUTUM-ASSESSMENT     Status: None   Collection Time    04/29/14  1:49 AM      Result Value Ref Range Status   Specimen Description SPUTUM   Final   Special Requests Immunocompromised   Final   Sputum evaluation     Final   Value: MICROSCOPIC FINDINGS SUGGEST THAT THIS SPECIMEN IS NOT REPRESENTATIVE OF LOWER RESPIRATORY SECRETIONS. PLEASE RECOLLECT.     CALLED TO J.Mel Almond RN [phone] 04/29/14 E.GADDY   Report Status 04/29/2014 FINAL   Final     Studies:  Recent x-ray studies have been reviewed in detail by the Attending Physician  Scheduled Meds:  Scheduled Meds: . acidophilus  1 capsule Oral Daily  . Apremilast  30 mg Oral Daily  . aspirin EC  81 mg Oral Daily  . atorvastatin  20 mg Oral QHS  . cloNIDine  0.1 mg Oral BID  . doxycycline  100 mg Oral Q12H  . ferrous sulfate  325 mg Oral Q breakfast  . fluticasone  2 spray Each Nare QHS  . heparin  5,000 Units Subcutaneous 3 times per day  . hydrALAZINE  10 mg Oral 3 times per day  . insulin aspart  0-15 Units Subcutaneous 6 times per day  . insulin glargine  40 Units Subcutaneous BID  . isosorbide dinitrate  20 mg Oral TID  . ketorolac  1 drop Both Eyes TID  . Linaclotide  290 mcg Oral QODAY  . metoCLOPramide  10 mg Oral QHS  . metoprolol tartrate  12.5 mg Oral BID  . multivitamin with minerals  1 tablet Oral Q breakfast  . pantoprazole  40 mg Oral Daily  . predniSONE  5 mg Oral Q breakfast  . pregabalin  100 mg Oral QPM  . sodium chloride  3 mL Intravenous Q12H  .  sodium chloride  3 mL Intravenous Q12H  . torsemide  40 mg Oral BID   Continuous Infusions:    Time spent on care of this patient: 35 min   [place], MD 04/30/2014, 9:44 AM  LOS: 4 days   [facility] Office  [phone] Pager - Text Page per [email or web address]  If 7PM-7AM, please contact night-coverage [email or web address]

## 2014-04-30 NOTE — Progress Notes (Signed)
Subjective: No chest pain, still SOB with exertion but also wheezes with exertion   Objective: Vital signs in last 24 hours: Temp:  [98 F (36.7 C)-99.1 F (37.3 C)] 98.3 F (36.8 C) (09/29 0900) Pulse Rate:  [61-71] 68 (09/29 0900) Resp:  [15-20] 15 (09/29 0700) BP: (114-140)/(39-64) 140/48 mmHg (09/29 0900) SpO2:  [97 %-100 %] 98 % (09/29 0700) Weight:  [228 lb 9.6 oz (103.692 kg)] 228 lb 9.6 oz (103.692 kg) (09/29 0534) Weight change: -10.9 oz (-0.308 kg) Last BM Date: 04/29/14 Intake/Output from previous day: -321  (since admit -1381)  Wt 228.9 down from 229.4 yesterday  09/28 0701 - 09/29 0700 In: 1553.7 [P.O.:960; I.V.:593.7] Out: 1875 [Urine:1875] Intake/Output this shift: Total I/O In: 240 [P.O.:240] Out: 325 [Urine:325]  PE: General:Pleasant affect, NAD Skin:Warm and dry, brisk capillary refill HEENT:normocephalic, sclera clear, mucus membranes moist Heart:S1S2 RRR without murmur, gallup, rub or click Lungs: with crackles, occ rhonchi, no wheezes ERX:VQMGQ, soft, non tender, + BS, do not palpate liver spleen or masses Ext:no lower ext edema, 2+ pedal pulses, 2+ radial pulses Neuro:alert and oriented, MAE, follows commands, + facial symmetry Tele:  SR once SB at 47 Lab Results:  Recent Labs  04/30/14 0911  WBC 11.2*  HGB 10.4*  HCT 34.2*  PLT 440*   BMET  Recent Labs  04/28/14 0424 04/29/14 0419  NA 137 136*  K 4.3 4.2  CL 99 97  CO2 24 25  GLUCOSE 154* 64*  BUN 64* 70*  CREATININE 3.38* 3.82*  CALCIUM 9.0 8.8    Recent Labs  04/27/14 1123 04/27/14 1550  TROPONINI <0.30 <0.30      Studies/Results: No results found.  Medications: I have reviewed the patient's current medications. Scheduled Meds: . acidophilus  1 capsule Oral Daily  . Apremilast  30 mg Oral Daily  . aspirin EC  81 mg Oral Daily  . atorvastatin  20 mg Oral QHS  . cloNIDine  0.1 mg Oral BID  . doxycycline  100 mg Oral Q12H  . ferrous sulfate  325 mg  Oral Q breakfast  . fluticasone  2 spray Each Nare QHS  . heparin  5,000 Units Subcutaneous 3 times per day  . hydrALAZINE  10 mg Oral 3 times per day  . insulin aspart  0-15 Units Subcutaneous 6 times per day  . insulin glargine  40 Units Subcutaneous BID  . isosorbide dinitrate  20 mg Oral TID  . ketorolac  1 drop Both Eyes TID  . Linaclotide  290 mcg Oral QODAY  . metoCLOPramide  10 mg Oral QHS  . metoprolol tartrate  12.5 mg Oral BID  . multivitamin with minerals  1 tablet Oral Q breakfast  . pantoprazole  40 mg Oral Daily  . predniSONE  5 mg Oral Q breakfast  . pregabalin  100 mg Oral QPM  . sodium chloride  3 mL Intravenous Q12H  . sodium chloride  3 mL Intravenous Q12H  . torsemide  40 mg Oral BID   Continuous Infusions:  PRN Meds:.sodium chloride, acetaminophen, albuterol, dicyclomine, guaiFENesin, hydrOXYzine, LORazepam, LORazepam, oxyCODONE-acetaminophen, sodium chloride, traMADol, triamcinolone ointment  Assessment/Plan: [name] is a 68 y.o. female for dyspnea and increasing pedal edema. She had doubled he Torsemide from 40 daily to BID at home but despite this remained short of breath. Initially thought to be fluid overloaded but now is coughing up green sputum. Has barely had any urine output on a Lasix  infusion.   1. Acute on chronic diastolic HF with weight gain and dyspnea  - originally felt to be due to acute diastolic HF due to recent weight gain  - Echo 04/27/2014 EF 55-60%, no significant valvular dx  - hold metolazone with worsening renal function  - back on home Demadex  Cr slowly coming down 2. Acute bronchitis: continue abx  3. COPD  4. Morbid obesity  5. Sarcoidosis  6. OSA on CPAP  7. CKD stage IV  - Cr 3.06 --> 3.82 - -> 3.61. 8.  HTN labile from 114.57 to 140/48 - may be up just prior to meds.      LOS: 4 days   Time spent with pt. :15 minutes. [facility] R  Nurse Practitioner Certified Pager [phone] or after 5pm and on weekends  call [phone] 04/30/2014, 10:14 AM

## 2014-04-30 NOTE — Progress Notes (Addendum)
Patient seen and examined and agree with note as outlined by Cecilie Kicks NP.  Her respiratory issues are mostly due to acute bronchitis with purulent mucous production which has improved on antibx.  No evidence of CHF on Chest CT or xray.  Now back on Demadex home dose.  No other recs at this time.  Would get back into advanced CHF clinic as an outpt.  Will sign off. Call with any questions.

## 2014-05-01 LAB — GLUCOSE, CAPILLARY
GLUCOSE-CAPILLARY: 142 mg/dL — AB (ref 70–99)
GLUCOSE-CAPILLARY: 259 mg/dL — AB (ref 70–99)
Glucose-Capillary: 77 mg/dL (ref 70–99)
Glucose-Capillary: 188 mg/dL — ABNORMAL HIGH (ref 70–99)

## 2014-05-01 MED ORDER — INSULIN DETEMIR 100 UNIT/ML FLEXPEN
35.0000 [IU] | PEN_INJECTOR | Freq: Two times a day (BID) | SUBCUTANEOUS | Status: DC
Start: 2014-05-01 — End: 2014-05-08

## 2014-05-01 MED ORDER — DOXYCYCLINE HYCLATE 100 MG PO TABS: 100.0000 mg | ORAL_TABLET | Freq: Two times a day (BID) | ORAL | Status: DC

## 2014-05-01 MED ORDER — INSULIN DETEMIR 100 UNIT/ML FLEXPEN: 40.0000 [IU] | PEN_INJECTOR | Freq: Two times a day (BID) | SUBCUTANEOUS | Status: DC

## 2014-05-01 NOTE — Progress Notes (Signed)
Discharge instructions reviewed with patient and family. All questions answered at this time. Awaiting on transportation.  Ave Filter, RN

## 2014-05-01 NOTE — Progress Notes (Signed)
Occupational Therapy Treatment Patient Details Name: [name] MRN: [id number] DOB: 1945/10/06 Today's Date: 05/01/2014    History of present illness Pt is a 68 y.o. female with past medical history of asthma, COPD, sarcoidosis, diabetes type 2, CAD, ESRD, OSA, diastolic congestive heart failure, who presents with worsening leg edema and shortness of breath.    OT comments  Pt seen today to address dyspnea during ADLs. Pt overall at [place] guard level for ADLs and is progressing towards goals. Pt will continue to benefit from acute OT to increase activity tolerance and promote independence with ADLs and functional mobility.    Follow Up Recommendations  No OT follow up;Supervision - Intermittent    Equipment Recommendations  None recommended by OT    Recommendations for Other Services      Precautions / Restrictions Precautions Precautions: Fall Restrictions Weight Bearing Restrictions: No       Mobility Bed Mobility               General bed mobility comments: Pt sitting in chair at sink when OT arrived and returned to recliner chair.  Transfers Overall transfer level: Needs assistance Equipment used: Rolling walker (2 wheeled);None Transfers: Sit to/from Stand Sit to Stand: Supervision         General transfer comment: Pt able to stand from chair to pull up underwear with Supervision. No LOB and good technique.         ADL Overall ADL's : Needs assistance/impaired     Grooming: Wash/dry hands;Wash/dry face;Oral care;Brushing hair;Supervision/safety;Set up;Sitting (at sink)           Upper Body Dressing : Supervision/safety;Set up;Sitting   Lower Body Dressing: Supervision/safety;Set up;Sit to/from stand   Toilet Transfer: Min guard;Ambulation;RW;BSC           Functional mobility during ADLs: Min guard;Rolling walker General ADL Comments: Pt sitting at sink for ADLs when OT arrived and verbalized energy conservation techniques. Pt O2 sats  on room air was 88% with activity and following ambulation. Pt quickly returned to 92% on room air after sitting ~30 seconds and OT reapplied [place] with increased O2 sats to 95%.                 Cognition  Arousal/Alertness: Awake/Alert Behavior During Therapy: WFL for tasks assessed/performed Overall Cognitive Status: Within Functional Limits for tasks assessed                                    Pertinent Vitals/ Pain       Pain Assessment: No/denies pain         Frequency Min 2X/week     Progress Toward Goals  OT Goals(current goals can now be found in the care plan section)  Progress towards OT goals: Progressing toward goals  Acute Rehab OT Goals Patient Stated Goal: To return home OT Goal Formulation: With patient Time For Goal Achievement: 05/05/14 Potential to Achieve Goals: Good ADL Goals Pt Will Perform Upper Body Bathing: with modified independence;sitting;standing Pt Will Perform Lower Body Bathing: with modified independence;sit to/from stand Pt Will Transfer to Toilet: with modified independence;ambulating Additional ADL Goal #1: Pt will independently verbalize and demonstrate 3/3 energy conservation techniques.  Plan Discharge plan remains appropriate       End of Session Equipment Utilized During Treatment: Rolling walker;Oxygen   Activity Tolerance Patient tolerated treatment well   Patient Left in chair;with call bell/phone within reach;with  chair alarm set   Nurse Communication          Time: [phone] OT Time Calculation (min): 23 min  Charges: OT General Charges $OT Visit: 1 Procedure OT Treatments $Self Care/Home Management : 8-22 mins $Therapeutic Activity: 8-22 mins  Juluis Rainier 05/01/2014, 10:44 AM  Cyndie Chime, OTR/L Occupational Therapist [phone] (pager)

## 2014-05-01 NOTE — Progress Notes (Signed)
Physical Therapy Treatment Patient Details Name: [name] MRN: [id number] DOB: 11/08/1945 Today's Date: 05/01/2014    History of Present Illness Pt is a 68 y.o. female with past medical history of asthma, COPD, sarcoidosis, diabetes type 2, CAD, ESRD, OSA, diastolic congestive heart failure, who presents with worsening leg edema and shortness of breath.     PT Comments    Pt progressing towards physical therapy goals. Pt was able to tolerate therapeutic exercise well, with no reports of DOE. Pt reports increased pain in R foot> L foot, and states she has had issues with peripheral neuropathy for some time now. Noted swelling on dorsum of foot bilaterally, and recommended pt sit with feet elevated after session. Focus of session was on exercise as pt had already walked this morning with OT.   Follow Up Recommendations  Home health PT;Supervision for mobility/OOB     Equipment Recommendations  None recommended by PT    Recommendations for Other Services       Precautions / Restrictions Precautions Precautions: Fall Restrictions Weight Bearing Restrictions: No    Mobility  Bed Mobility               General bed mobility comments: Pt sitting in recliner upon PT arrival.  Transfers Overall transfer level: Needs assistance Equipment used: Rolling walker (2 wheeled) Transfers: Sit to/from Stand Sit to Stand: Supervision         General transfer comment: Pt demonstrated proper hand placement and safety awareness with transfers.   Ambulation/Gait                 Stairs            Wheelchair Mobility    Modified Rankin (Stroke Patients Only)       Balance Overall balance assessment: Needs assistance Sitting-balance support: Feet supported;No upper extremity supported Sitting balance-Leahy Scale: Good     Standing balance support: Bilateral upper extremity supported;During functional activity Standing balance-Leahy Scale: Fair Standing  balance comment: Pt can stand statically without UE assist, however needs UE support if performing dynamic standing activity.                     Cognition Arousal/Alertness: Awake/alert Behavior During Therapy: WFL for tasks assessed/performed Overall Cognitive Status: Within Functional Limits for tasks assessed                      Exercises General Exercises - Lower Extremity Ankle Circles/Pumps: 20 reps Quad Sets: 15 reps Long Arc Quad: 15 reps Hip ABduction/ADduction: 15 reps (Also x15 isometric add with pillow) Straight Leg Raises: Standing;15 reps Mini-Sqauts: 15 reps    General Comments        Pertinent Vitals/Pain Pain Assessment: No/denies pain    Home Living                      Prior Function            PT Goals (current goals can now be found in the care plan section) Acute Rehab PT Goals Patient Stated Goal: To return home PT Goal Formulation: With patient Time For Goal Achievement: 05/04/14 Potential to Achieve Goals: Good Progress towards PT goals: Progressing toward goals    Frequency  Min 3X/week    PT Plan Current plan remains appropriate    Co-evaluation             End of Session Equipment Utilized During Treatment: Gait belt;Oxygen Activity  Tolerance: Patient tolerated treatment well Patient left: in chair;with call bell/phone within reach     Time: [id number] PT Time Calculation (min): 19 min  Charges:  $Therapeutic Exercise: 8-22 mins                    G Codes:      Jolyn Lent 2014/05/31, 12:19 PM  Jolyn Lent, PT, DPT Acute Rehabilitation Services Pager: [phone]

## 2014-05-02 ENCOUNTER — Telehealth: Payer: Self-pay | Admitting: Cardiology

## 2014-05-02 NOTE — Telephone Encounter (Signed)
New message     Will Dr Radford Pax be willing to write home health orders on this pt until she can be seen in the heart failure clinic on wed.  Dr Radford Pax saw pt in [place].  Heart failure clinic will not write orders until they see her and the hospitalist or fellow who saw her in the hosp defer her to Dr Radford Pax.  Please let him know ASAP if Dr Radford Pax is available to write orders.  Arville Go feels like pt may be back in the hosp before wed if they do not continue to see her.

## 2014-05-02 NOTE — Telephone Encounter (Signed)
To Dr Radford Pax to Advise.

## 2014-05-02 NOTE — Telephone Encounter (Signed)
The hospitalist or PCP need to do this. She was on the hospitalist service and her primary problem was acute bronchitis with purulent sputum and only mildCHF

## 2014-05-05 NOTE — Discharge Summary (Signed)
Physician Discharge Summary  [name] JXB:[id number] DOB: 1945/12/18 DOA: 04/26/2014  PCP: Antony Blackbird, MD  Admit date: 04/26/2014 Discharge date: 05/01/2014  Time spent: 30 minutes  Recommendations for Outpatient Follow-up:  1. Follow up with PCP in one week.   Discharge Diagnoses:  Principal Problem:   Acute respiratory failure with hypoxia Active Problems:   HYPERTENSION   CAD   ASTHMA   Sarcoidosis   Diabetes mellitus with renal manifestation   End stage renal disease   COPD (chronic obstructive pulmonary disease)   Acute and chronic respiratory failure   Discharge Condition: improved  Diet recommendation: low sodium / carb modified diet.   Filed Weights   04/29/14 0432 04/30/14 0534 05/01/14 0657  Weight: 104 kg (229 lb 4.5 oz) 103.692 kg (228 lb 9.6 oz) 103.41 kg (227 lb 15.6 oz)    History of present illness:   [name] is a 68 y.o. female for dyspnea and increasing pedal edema. She had doubled he Torsemide from 40 daily to BID at home but despite this remained short of breath. Initially thought to be fluid overloaded but now is coughing up green sputum. He was treated with bronchitis and discharged home on antibiotics.   Hospital Course:   Acute respiratory failure with hypoxia  (a)- acute on chronic diastolic CHF?  initially though to be a CHF exacerbation- cardiology consulted - suspected to be minimal CHF - (b) acute bronchitis  - began to cough up green sputum overnight on 9/26- started Doxycycline- allergic to Quinolones- sputum culture ordered  - obtained CT chest with out contrast to look for pneumonia- this reveals clear lungs- no pneumonia or pulmonary edema- suspect most of her symptoms are related to acute bronchitis.  (c) sarcoidosis- on Prednisone which was just tapered down from 40 mg to 5- will follow for improvement with antibiotics- (d) obesity hypoventilation? States she continues to gain weight due to recent increase in  steroids  (e) OSA-often refusing CPAP at night  Active Problems:  HYPERTENSION  - Imdur, Lopressor, Clonidine  CAD  - cont ASA 81 mg and B Blocker  Diabetes mellitus with renal manifestation  Resume insulin.  CKD 4-5  Stable. Procedures:  none  Consultations:  cardiology  Discharge Exam: Filed Vitals:   05/01/14 1341  BP: 117/62  Pulse: 60  Temp: 98 F (36.7 C)  Resp: 14    General: alert afebrile comfortable Cardiovascular: s1s2 Respiratory: ctab  Discharge Instructions You were cared for by a hospitalist during your hospital stay. If you have any questions about your discharge medications or the care you received while you were in the hospital after you are discharged, you can call the unit and asked to speak with the hospitalist on call if the hospitalist that took care of you is not available. Once you are discharged, your primary care physician will handle any further medical issues. Please note that NO REFILLS for any discharge medications will be authorized once you are discharged, as it is imperative that you return to your primary care physician (or establish a relationship with a primary care physician if you do not have one) for your aftercare needs so that they can reassess your need for medications and monitor your lab values.  Discharge Instructions   Diet - low sodium heart healthy    Complete by:  As directed      Discharge instructions    Complete by:  As directed   Follow up with PCP in 1 week.  Discharge Medication List as of 05/01/2014  2:13 PM    START taking these medications   Details  doxycycline (VIBRA-TABS) 100 MG tablet Take 1 tablet (100 mg total) by mouth every 12 (twelve) hours., Starting 05/01/2014, Until Discontinued, Print      CONTINUE these medications which have CHANGED   Details  Insulin Detemir (LEVEMIR FLEXTOUCH) 100 UNIT/ML Pen Inject 35 Units into the skin 2 (two) times daily., Starting 05/01/2014, Until Discontinued,  Normal      CONTINUE these medications which have NOT CHANGED   Details  acetaminophen (TYLENOL) 500 MG tablet Take 500 mg by mouth 2 (two) times daily as needed (pain). , Until Discontinued, Historical Med    albuterol (PROVENTIL HFA;VENTOLIN HFA) 108 (90 BASE) MCG/ACT inhaler Inhale 2 puffs into the lungs every 4 (four) hours as needed for wheezing or shortness of breath. , Until Discontinued, Historical Med    albuterol (PROVENTIL) (2.5 MG/3ML) 0.083% nebulizer solution Take 2.5 mg by nebulization every 2 (two) hours as needed for wheezing or shortness of breath., Until Discontinued, Historical Med    Alpha-D-Galactosidase (BEANO PO) Take 1 tablet by mouth daily., Until Discontinued, Historical Med    Apremilast (OTEZLA) 30 MG TABS Take 30 mg by mouth daily. , Until Discontinued, Historical Med    aspirin EC 81 MG tablet Take 81 mg by mouth daily., Until Discontinued, Historical Med    atorvastatin (LIPITOR) 20 MG tablet Take 20 mg by mouth at bedtime., Until Discontinued, Historical Med    cloNIDine (CATAPRES) 0.1 MG tablet Take 0.1 mg by mouth 2 (two) times daily. , Starting 02/12/2014, Until Discontinued, Historical Med    dicyclomine (BENTYL) 10 MG capsule Take 10 mg by mouth 3 (three) times daily as needed for spasms., Until Discontinued, Historical Med    esomeprazole (NEXIUM) 40 MG capsule Take 1 capsule (40 mg total) by mouth 2 (two) times daily., Starting 11/14/2013, Until Discontinued, Normal    ferrous sulfate 325 (65 FE) MG tablet Take 325 mg by mouth daily with breakfast., Until Discontinued, Historical Med    fluticasone (FLONASE) 50 MCG/ACT nasal spray Place 2 sprays into both nostrils at bedtime., Until Discontinued, Historical Med    Fluticasone-Salmeterol (ADVAIR) 250-50 MCG/DOSE AEPB Inhale 1 puff into the lungs 2 (two) times daily., Starting 03/22/2014, Until Discontinued, Normal    guaiFENesin (MUCINEX) 600 MG 12 hr tablet Take 600 mg by mouth 2 (two) times daily  as needed for cough or to loosen phlegm (congestion). , Until Discontinued, Historical Med    hydrALAZINE (APRESOLINE) 10 MG tablet take 1 tablet by mouth three times a day, Normal    hydrOXYzine (ATARAX/VISTARIL) 25 MG tablet Take 25 mg by mouth every 4 (four) hours as needed for itching., Until Discontinued, Historical Med    insulin lispro (HUMALOG KWIKPEN) 100 UNIT/ML SOPN Inject 30-52 Units into the skin 3 (three) times daily. Take 30 units  (+4-12 units based on sliding scale) every morning, 40 units (+4-12 units) in the afternoon and 40 units (+4-12 units) in the evening., Until Discontinued, Historical Med    isosorbide dinitrate (ISORDIL) 20 MG tablet Take 1 tablet (20 mg total) by mouth 3 (three) times daily., Starting 08/20/2013, Until Discontinued, Normal    ketorolac (ACULAR LS) 0.4 % SOLN Place 1 drop into both eyes 3 (three) times daily. , Until Discontinued, Historical Med    Linaclotide (LINZESS) 290 MCG CAPS capsule Take 290 mcg by mouth every other day., Starting 06/14/2013, Until Discontinued, Historical Med  LORazepam (ATIVAN) 1 MG tablet Take 1 mg by mouth at bedtime as needed for sleep. , Until Discontinued, Historical Med    metoCLOPramide (REGLAN) 10 MG tablet Take 1 tablet (10 mg total) by mouth at bedtime., Starting 03/22/2014, Until Discontinued, Normal    metoprolol tartrate (LOPRESSOR) 25 MG tablet Take 0.5 tablets (12.5 mg total) by mouth 2 (two) times daily., Starting 05/01/2013, Until Discontinued, Normal    Multiple Vitamins-Minerals (MULTIVITAMIN WITH MINERALS) tablet Take 1 tablet by mouth daily., Until Discontinued, Historical Med    oxyCODONE-acetaminophen (PERCOCET/ROXICET) 5-325 MG per tablet Take 1 tablet by mouth daily as needed for severe pain., Until Discontinued, Historical Med    predniSONE (DELTASONE) 5 MG tablet Take 5 mg by mouth daily with breakfast., Until Discontinued, Historical Med    pregabalin (LYRICA) 100 MG capsule Take 1 capsule (100  mg total) by mouth every evening., Starting 11/14/2013, Until Discontinued, Phone In    Probiotic Product (PROBIOTIC PO) Take 1 capsule by mouth daily., Until Discontinued, Historical Med    torsemide (DEMADEX) 20 MG tablet Take 2 tablets (40 mg total) by mouth daily., Starting 05/03/2013, Until Discontinued, Normal    traMADol (ULTRAM) 50 MG tablet Take 1 tablet (50 mg total) by mouth 3 (three) times daily as needed for moderate pain or severe pain., Starting 02/18/2014, Until Discontinued, Print    triamcinolone ointment (KENALOG) 0.1 % Apply 1 application topically 2 (two) times daily as needed (psoriasis)., Until Discontinued, Historical Med    Lancets MISC 1 Units by Does not apply route 4 (four) times daily - after meals and at bedtime., Until Discontinued, Historical Med      STOP taking these medications     hydrochlorothiazide (HYDRODIURIL) 25 MG tablet      doxycycline (VIBRAMYCIN) 100 MG capsule        Allergies  Allergen Reactions  . Adhesive [Tape] Other (See Comments)    Blisters   . Avelox [Moxifloxacin Hcl In Nacl] Other (See Comments)    GI upset  . Codeine Other (See Comments)    "crazy"   . Guaifenesin Nausea And Vomiting    Takes Mucinex at home without issue  . Latex Swelling  . Oxycodone Nausea And Vomiting    Takes Percocet at home without issue   Follow-up Information   Follow up with [facility]. (RN/PT/OT)    Contact information:   [place] [address] [phone]        The results of significant diagnostics from this hospitalization (including imaging, microbiology, ancillary and laboratory) are listed below for reference.    Significant Diagnostic Studies: Dg Chest 2 View  04/27/2014   CLINICAL DATA:  Shortness of breath tonight.  EXAM: CHEST  2 VIEW  COMPARISON:  03/28/2014  FINDINGS: Mild cardiac enlargement without vascular congestion. No focal airspace disease or consolidation in the lungs. No  blunting of costophrenic angles. No pneumothorax. Mediastinal contours appear intact. Surgical clips in the upper abdomen. No change since prior study.  IMPRESSION: Mild cardiac enlargement.  No evidence of active pulmonary disease.   Electronically Signed   By: Lucienne Capers M.D.   On: 04/27/2014 00:52   Ct Chest Wo Contrast  04/28/2014   CLINICAL DATA:  Productive cough.  History of sarcoid.  EXAM: CT CHEST WITHOUT CONTRAST  TECHNIQUE: Multidetector CT imaging of the chest was performed following the standard protocol without IV contrast.  COMPARISON:  Chest x-ray 04/27/2014.  CT 08/25/2013, 07/21/2012.  FINDINGS: Thoracic aorta  normal caliber.  Heart size normal.  Shotty mediastinal lymph nodes. No definite hilar lymph nodes noted on this nonenhanced study. Thoracic esophagus unremarkable.  Large airways patent. Stable bibasilar atelectasis and/or scarring. No pleural effusion or pneumothorax.  Adrenals are unremarkable. Stable 1.9 cm low density lesion with calcification noted in the right lobe of the liver, unchanged from prior study dated 07/21/2012, and thus most likely benign. Splenectomy. Splenosis. Cholecystectomy. Adrenals unremarkable.  Thyroid unremarkable. No significant axillary adenopathy. Chest wall is intact. No acute bony abnormality. Diffuse degenerative change thoracic spine.  IMPRESSION: 1. No significant mediastinal or hilar adenopathy noted on this nonenhanced scan in this patient with known history of sarcoid. 2. Stable bibasilar atelectasis and/or scarring. No acute abnormality.   Electronically Signed   By: Marcello Moores  Register   On: 04/28/2014 11:36    Microbiology: Recent Results (from the past 240 hour(s))  CULTURE, EXPECTORATED SPUTUM-ASSESSMENT     Status: None   Collection Time    04/29/14  1:49 AM      Result Value Ref Range Status   Specimen Description SPUTUM   Final   Special Requests Immunocompromised   Final   Sputum evaluation     Final   Value: MICROSCOPIC  FINDINGS SUGGEST THAT THIS SPECIMEN IS NOT REPRESENTATIVE OF LOWER RESPIRATORY SECRETIONS. PLEASE RECOLLECT.     CALLED TO J.Mel Almond RN [phone] 04/29/14 E.GADDY   Report Status 04/29/2014 FINAL   Final     Labs: Basic Metabolic Panel:  Recent Labs Lab 04/29/14 0419 04/30/14 0911  NA 136* 133*  K 4.2 4.8  CL 97 94*  CO2 25 25  GLUCOSE 64* 199*  BUN 70* 78*  CREATININE 3.82* 3.61*  CALCIUM 8.8 9.3  MG 1.6  --    Liver Function Tests: No results found for this basename: AST, ALT, ALKPHOS, BILITOT, PROT, ALBUMIN,  in the last 168 hours No results found for this basename: LIPASE, AMYLASE,  in the last 168 hours No results found for this basename: AMMONIA,  in the last 168 hours CBC:  Recent Labs Lab 04/30/14 0911  WBC 11.2*  HGB 10.4*  HCT 34.2*  MCV 92.9  PLT 440*   Cardiac Enzymes: No results found for this basename: CKTOTAL, CKMB, CKMBINDEX, TROPONINI,  in the last 168 hours BNP: BNP (last 3 results)  Recent Labs  04/26/14 2358  PROBNP 3725.0*   CBG:  Recent Labs Lab 04/30/14 2004 05/01/14 0012 05/01/14 0419 05/01/14 0838 05/01/14 1145  GLUCAP 253* 142* 77 259* 188*       Signed:  Loxley Cibrian  [facility] 05/05/2014, 2:35 PM

## 2014-05-06 NOTE — Telephone Encounter (Signed)
[name] at [facility] notified.

## 2014-05-08 ENCOUNTER — Ambulatory Visit (HOSPITAL_COMMUNITY)
Admission: RE | Admit: 2014-05-08 | Discharge: 2014-05-08 | Disposition: A | Discharge status: Home / Self Care | Payer: Medicare Other | Source: Ambulatory Visit | Attending: Cardiology | Admitting: Cardiology

## 2014-05-08 ENCOUNTER — Telehealth (HOSPITAL_COMMUNITY): Payer: Self-pay | Admitting: Vascular Surgery

## 2014-05-08 ENCOUNTER — Encounter (HOSPITAL_COMMUNITY): Payer: Self-pay

## 2014-05-08 VITALS — BP 134/50 | HR 75 | Wt 221.6 lb

## 2014-05-08 DIAGNOSIS — J45909 Unspecified asthma, uncomplicated: Secondary | ICD-10-CM | POA: Diagnosis not present

## 2014-05-08 DIAGNOSIS — N184 Chronic kidney disease, stage 4 (severe): Secondary | ICD-10-CM | POA: Diagnosis not present

## 2014-05-08 DIAGNOSIS — Z8249 Family history of ischemic heart disease and other diseases of the circulatory system: Secondary | ICD-10-CM | POA: Diagnosis not present

## 2014-05-08 DIAGNOSIS — E119 Type 2 diabetes mellitus without complications: Secondary | ICD-10-CM | POA: Insufficient documentation

## 2014-05-08 DIAGNOSIS — J449 Chronic obstructive pulmonary disease, unspecified: Secondary | ICD-10-CM | POA: Insufficient documentation

## 2014-05-08 DIAGNOSIS — E785 Hyperlipidemia, unspecified: Secondary | ICD-10-CM | POA: Diagnosis not present

## 2014-05-08 DIAGNOSIS — N186 End stage renal disease: Secondary | ICD-10-CM

## 2014-05-08 DIAGNOSIS — I129 Hypertensive chronic kidney disease with stage 1 through stage 4 chronic kidney disease, or unspecified chronic kidney disease: Secondary | ICD-10-CM | POA: Diagnosis not present

## 2014-05-08 DIAGNOSIS — I5032 Chronic diastolic (congestive) heart failure: Secondary | ICD-10-CM | POA: Diagnosis present

## 2014-05-08 DIAGNOSIS — D869 Sarcoidosis, unspecified: Secondary | ICD-10-CM | POA: Insufficient documentation

## 2014-05-08 DIAGNOSIS — Z794 Long term (current) use of insulin: Secondary | ICD-10-CM | POA: Diagnosis not present

## 2014-05-08 DIAGNOSIS — I2510 Atherosclerotic heart disease of native coronary artery without angina pectoris: Secondary | ICD-10-CM | POA: Insufficient documentation

## 2014-05-08 DIAGNOSIS — Z9981 Dependence on supplemental oxygen: Secondary | ICD-10-CM | POA: Insufficient documentation

## 2014-05-08 NOTE — Telephone Encounter (Signed)
Verbal order for occupational therapy twice a week for 2 weeks , once a week for one week

## 2014-05-08 NOTE — Patient Instructions (Signed)
Emmit Alexanders with everything, keep your head up.  Follow up as needed.  Do the following things EVERYDAY: 1) Weigh yourself in the morning before breakfast. Write it down and keep it in a log. 2) Take your medicines as prescribed 3) Eat low salt foods-Limit salt (sodium) to 2000 mg per day.  4) Stay as active as you can everyday 5) Limit all fluids for the day to less than 2 liters 6)

## 2014-05-08 NOTE — Progress Notes (Signed)
Patient ID: Marlowe Kays, female   DOB: 11/22/1945, 68 y.o.   MRN: [id number] PCP: Dr. Frann Rider [facility]) Primary Cardiologist: Dr. Johnsie Cancel Primary Pulmonologist: Dr. Teressa Lower Nephrologist: Dr. Pearson Grippe  HPI: Ms. Mair is a 68 y.o. female with hx of morbid obesity, sleep apnea (with CPAP), asthma/COPD, chronic prediosne use for sarcoidosis, CRI (baseline Cr 2.2- chronic diastolic CHF, CAD and DM.    Echo 04/03/13: EF 60-65% grade 2 DD. Mild AS. RV normal.         04/27/14: EF 55-60%, RV nl  [facility] Follow up for Heart Failure: Admitted for A/C respiratory failure and was treated with antibiotics and gave some IV lasix. Reports she is is very weak. Has PT at home. As long as she does not bend over or walk too fast her breathing is ok. She has needed to use increase oxygen. Wears 2-3 liters now about 24 hrs/a day. Wears CPAP at night. Decreased appetite. Following a low salt diet and drinking less than 2L a day. Weight at home 220-222 lbs, reports weight is usually around 214 lbs but has needed increased bursts of prednisone.    ROS: All systems negative except as listed in HPI, PMH and Problem List.  SH:  History   Social History  . Marital Status: Single    Spouse Name: N/A    Number of Children: 0  . Years of Education: N/A   Occupational History  . Retired   . SECRETARY    Social History Main Topics  . Smoking status: Never Smoker   . Smokeless tobacco: Never Used  . Alcohol Use: No  . Drug Use: No  . Sexual Activity: Not Currently   Other Topics Concern  . Not on file   Social History Narrative   Lives in [place] alone.  Never married.   Retired Art therapist at [employer]    FH:  Family History  Problem Relation Age of Onset  . Diabetes Mother   . Heart disease Mother   . Hyperlipidemia Mother   . Varicose Veins Mother   . Breast cancer Maternal Aunt   . Heart disease Father   . Deep vein thrombosis Father   . Hyperlipidemia  Father   . Heart disease Sister     before age 53  . Cancer Sister   . Diabetes Sister   . Hyperlipidemia Sister   . Heart attack Sister   . Heart disease Brother   . Diabetes Brother   . Hyperlipidemia Brother   . Heart attack Brother   . Hyperlipidemia Sister     Past Medical History  Diagnosis Date  . Essential hypertension, benign   . Coronary atherosclerosis of native coronary artery 01/04/2006    Tiny OM1 70-90% ostial stenosis, no other CAD  . COPD (chronic obstructive pulmonary disease)   . Esophageal reflux   . Dyslipidemia   . Gout   . Colon polyps     Tubular adenomatous polyps  . Spinal stenosis of lumbar region   . Bulging lumbar disc   . Chronic diastolic heart failure   . PNA (pneumonia) 2012    With pleural effusion, requiring thoracentesis  . Chronic bronchitis   . On home oxygen therapy   . OSA (obstructive sleep apnea)     Marginally compliant with CPAP  . Type II diabetes mellitus   . Arthritis   . Chronic lower back pain   . Diverticulosis   . GERD (gastroesophageal reflux disease)   .  Anxiety   . Hyperlipidemia   . Sarcoidosis of lung   . CKD (chronic kidney disease) stage 4, GFR 15-29 ml/min   . IBS (irritable bowel syndrome)   . Cataracts, both eyes   . WIOXBDZH(299.2)     Current Outpatient Prescriptions  Medication Sig Dispense Refill  . acetaminophen (TYLENOL) 500 MG tablet Take 500 mg by mouth 2 (two) times daily as needed (pain).       Marland Kitchen albuterol (PROVENTIL HFA;VENTOLIN HFA) 108 (90 BASE) MCG/ACT inhaler Inhale 2 puffs into the lungs every 4 (four) hours as needed for wheezing or shortness of breath.       Marland Kitchen albuterol (PROVENTIL) (2.5 MG/3ML) 0.083% nebulizer solution Take 2.5 mg by nebulization every 2 (two) hours as needed for wheezing or shortness of breath.      . Alpha-D-Galactosidase (BEANO PO) Take 1 tablet by mouth daily.      Marland Kitchen Apremilast (OTEZLA) 30 MG TABS Take 30 mg by mouth daily.       Marland Kitchen aspirin EC 81 MG tablet Take 81  mg by mouth daily.      Marland Kitchen atorvastatin (LIPITOR) 20 MG tablet Take 20 mg by mouth at bedtime.      . cloNIDine (CATAPRES) 0.1 MG tablet Take 0.1 mg by mouth 2 (two) times daily.       Marland Kitchen doxycycline (VIBRA-TABS) 100 MG tablet Take 1 tablet (100 mg total) by mouth every 12 (twelve) hours.  8 tablet  0  . esomeprazole (NEXIUM) 40 MG capsule Take 1 capsule (40 mg total) by mouth 2 (two) times daily.  60 capsule  3  . ferrous sulfate 325 (65 FE) MG tablet Take 325 mg by mouth daily with breakfast.      . fluticasone (FLONASE) 50 MCG/ACT nasal spray Place 2 sprays into both nostrils at bedtime.      . Fluticasone-Salmeterol (ADVAIR) 250-50 MCG/DOSE AEPB Inhale 1 puff into the lungs 2 (two) times daily.  180 each  3  . guaiFENesin (MUCINEX) 600 MG 12 hr tablet Take 600 mg by mouth 2 (two) times daily as needed for cough or to loosen phlegm (congestion).       . hydrALAZINE (APRESOLINE) 10 MG tablet take 1 tablet by mouth three times a day  90 tablet  5  . hydrOXYzine (ATARAX/VISTARIL) 25 MG tablet Take 25 mg by mouth every 4 (four) hours as needed for itching.      . Insulin Detemir (LEVEMIR) 100 UNIT/ML Pen Inject 40 Units into the skin 2 (two) times daily.      . insulin lispro (HUMALOG KWIKPEN) 100 UNIT/ML SOPN Inject 35 Units into the skin 3 (three) times daily. Take 35 units  (+4-12 units based on sliding scale) every morning, 40 units (+4-12 units) in the afternoon and 40 units (+4-12 units) in the evening.      . isosorbide dinitrate (ISORDIL) 20 MG tablet Take 1 tablet (20 mg total) by mouth 3 (three) times daily.  270 tablet  1  . ketorolac (ACULAR LS) 0.4 % SOLN Place 1 drop into both eyes 3 (three) times daily.       . Lancets MISC 1 Units by Does not apply route 4 (four) times daily - after meals and at bedtime.      . Linaclotide (LINZESS) 290 MCG CAPS capsule Take 290 mcg by mouth every other day.      Marland Kitchen LORazepam (ATIVAN) 1 MG tablet Take 1 mg by mouth at bedtime as needed for  sleep.        . metoprolol tartrate (LOPRESSOR) 25 MG tablet Take 0.5 tablets (12.5 mg total) by mouth 2 (two) times daily.  90 tablet  3  . Multiple Vitamins-Minerals (MULTIVITAMIN WITH MINERALS) tablet Take 1 tablet by mouth daily.      Marland Kitchen oxyCODONE-acetaminophen (PERCOCET/ROXICET) 5-325 MG per tablet Take 1 tablet by mouth daily as needed for severe pain.      . predniSONE (DELTASONE) 5 MG tablet Take 5 mg by mouth daily with breakfast.      . pregabalin (LYRICA) 100 MG capsule Take 1 capsule (100 mg total) by mouth every evening.  30 capsule  3  . Probiotic Product (PROBIOTIC PO) Take 1 capsule by mouth daily.      Marland Kitchen torsemide (DEMADEX) 20 MG tablet Take 2 tablets (40 mg total) by mouth daily.  60 tablet  3  . triamcinolone ointment (KENALOG) 0.1 % Apply 1 application topically 2 (two) times daily as needed (psoriasis).      Marland Kitchen metoCLOPramide (REGLAN) 10 MG tablet Take 1 tablet (10 mg total) by mouth at bedtime.  90 tablet  1   No current facility-administered medications for this encounter.    Filed Vitals:   05/08/14 1055  BP: 134/50  Pulse: 75  Weight: 221 lb 9.6 oz (100.517 kg)  SpO2: 98%    PHYSICAL EXAM: General:  Well appearing. No resp difficulty, in wheelchair HEENT: normal Neck: supple. JVP difficult to assess d/t body habitus but appears mildly elevated. Carotids 2+ bilaterally; no bruits. No lymphadenopathy or thryomegaly appreciated. Cor: PMI normal. Regular rate & rhythm. No rubs, gallops or murmurs. Lungs: diminished throughout Abdomen: obese, soft, nontender, nondistended. No hepatosplenomegaly. No bruits or masses. Good bowel sounds. Extremities: no cyanosis, clubbing, rash, trace bilateral edema, L arm fistula Neuro: alert & orientedx3, cranial nerves grossly intact. Moves all 4 extremities w/o difficulty. Affect pleasant.   ASSESSMENT & PLAN:  1) Chronic diastolic HF: EF 33-29%, mild AS, RV nl (04/2014) - Reviewed discharge summary and patient just admitted for A/C  respiratory failure. - NYHA III-IIIb symptoms and volume status mildly elevated. - Will continue torsemide 40 mg daily. Unfortunately I think she is nearing the stage that she is going to need HD. - Reinforced the need and importance of daily weights, a low sodium diet, and fluid restriction (less than 2 L a day). Instructed to call the HF clinic if weight increases more than 3 lbs overnight or 5 lbs in a week.  2) COPD - Managed by pulmonary 3) Sarcoidosis - managed by pulmonary 4) CKD stage IV - creatinine 3.06-3.8. Followed by Dr. Joelyn Oms and seeing the patient tomorrow. Has L arm fistula that is maturing.  5) CAD - no s/s of ischemia. Continue statin and ASA. Managed by Dr. Johnsie Cancel  Will follow up PRN. I think it going to be very difficult to manage her fluid without HD. Told to call however if weight starts trending up and we could get her in to see if there is anything else we could do.   Junie Bame B NP-C 5:11 PM

## 2014-05-10 ENCOUNTER — Ambulatory Visit: Payer: Medicare Other | Admitting: Pulmonary Disease

## 2014-05-12 ENCOUNTER — Emergency Department (HOSPITAL_COMMUNITY): Payer: Medicare Other

## 2014-05-12 ENCOUNTER — Emergency Department (HOSPITAL_COMMUNITY)
Admission: EM | Admit: 2014-05-12 | Discharge: 2014-05-12 | Disposition: A | Discharge status: Home / Self Care | Payer: Medicare Other | Attending: Emergency Medicine | Admitting: Emergency Medicine

## 2014-05-12 ENCOUNTER — Encounter (HOSPITAL_COMMUNITY): Payer: Self-pay | Admitting: Emergency Medicine

## 2014-05-12 DIAGNOSIS — I5032 Chronic diastolic (congestive) heart failure: Secondary | ICD-10-CM | POA: Diagnosis not present

## 2014-05-12 DIAGNOSIS — Z90710 Acquired absence of both cervix and uterus: Secondary | ICD-10-CM | POA: Insufficient documentation

## 2014-05-12 DIAGNOSIS — Z79899 Other long term (current) drug therapy: Secondary | ICD-10-CM | POA: Insufficient documentation

## 2014-05-12 DIAGNOSIS — M1990 Unspecified osteoarthritis, unspecified site: Secondary | ICD-10-CM | POA: Insufficient documentation

## 2014-05-12 DIAGNOSIS — R1013 Epigastric pain: Secondary | ICD-10-CM | POA: Diagnosis not present

## 2014-05-12 DIAGNOSIS — Z7951 Long term (current) use of inhaled steroids: Secondary | ICD-10-CM | POA: Insufficient documentation

## 2014-05-12 DIAGNOSIS — F419 Anxiety disorder, unspecified: Secondary | ICD-10-CM | POA: Insufficient documentation

## 2014-05-12 DIAGNOSIS — Z91040 Latex allergy status: Secondary | ICD-10-CM | POA: Diagnosis not present

## 2014-05-12 DIAGNOSIS — Z8701 Personal history of pneumonia (recurrent): Secondary | ICD-10-CM | POA: Insufficient documentation

## 2014-05-12 DIAGNOSIS — G8929 Other chronic pain: Secondary | ICD-10-CM | POA: Insufficient documentation

## 2014-05-12 DIAGNOSIS — Z7982 Long term (current) use of aspirin: Secondary | ICD-10-CM | POA: Diagnosis not present

## 2014-05-12 DIAGNOSIS — R197 Diarrhea, unspecified: Secondary | ICD-10-CM | POA: Diagnosis not present

## 2014-05-12 DIAGNOSIS — Z9889 Other specified postprocedural states: Secondary | ICD-10-CM | POA: Insufficient documentation

## 2014-05-12 DIAGNOSIS — Z7952 Long term (current) use of systemic steroids: Secondary | ICD-10-CM | POA: Insufficient documentation

## 2014-05-12 DIAGNOSIS — H269 Unspecified cataract: Secondary | ICD-10-CM | POA: Insufficient documentation

## 2014-05-12 DIAGNOSIS — Z794 Long term (current) use of insulin: Secondary | ICD-10-CM | POA: Diagnosis not present

## 2014-05-12 DIAGNOSIS — M109 Gout, unspecified: Secondary | ICD-10-CM | POA: Insufficient documentation

## 2014-05-12 DIAGNOSIS — Z791 Long term (current) use of non-steroidal anti-inflammatories (NSAID): Secondary | ICD-10-CM | POA: Diagnosis not present

## 2014-05-12 DIAGNOSIS — R100 Acute abdomen: Secondary | ICD-10-CM | POA: Diagnosis present

## 2014-05-12 DIAGNOSIS — G4733 Obstructive sleep apnea (adult) (pediatric): Secondary | ICD-10-CM | POA: Diagnosis not present

## 2014-05-12 DIAGNOSIS — R6883 Chills (without fever): Secondary | ICD-10-CM | POA: Diagnosis not present

## 2014-05-12 DIAGNOSIS — Z9089 Acquired absence of other organs: Secondary | ICD-10-CM | POA: Diagnosis not present

## 2014-05-12 DIAGNOSIS — R1084 Generalized abdominal pain: Secondary | ICD-10-CM

## 2014-05-12 DIAGNOSIS — R0682 Tachypnea, not elsewhere classified: Secondary | ICD-10-CM | POA: Insufficient documentation

## 2014-05-12 DIAGNOSIS — Z86010 Personal history of colonic polyps: Secondary | ICD-10-CM | POA: Insufficient documentation

## 2014-05-12 DIAGNOSIS — I2510 Atherosclerotic heart disease of native coronary artery without angina pectoris: Secondary | ICD-10-CM | POA: Insufficient documentation

## 2014-05-12 DIAGNOSIS — E785 Hyperlipidemia, unspecified: Secondary | ICD-10-CM | POA: Insufficient documentation

## 2014-05-12 DIAGNOSIS — Z9981 Dependence on supplemental oxygen: Secondary | ICD-10-CM | POA: Insufficient documentation

## 2014-05-12 DIAGNOSIS — J449 Chronic obstructive pulmonary disease, unspecified: Secondary | ICD-10-CM | POA: Insufficient documentation

## 2014-05-12 DIAGNOSIS — N184 Chronic kidney disease, stage 4 (severe): Secondary | ICD-10-CM | POA: Diagnosis not present

## 2014-05-12 DIAGNOSIS — R1033 Periumbilical pain: Secondary | ICD-10-CM | POA: Diagnosis not present

## 2014-05-12 DIAGNOSIS — K219 Gastro-esophageal reflux disease without esophagitis: Secondary | ICD-10-CM | POA: Insufficient documentation

## 2014-05-12 DIAGNOSIS — E119 Type 2 diabetes mellitus without complications: Secondary | ICD-10-CM | POA: Insufficient documentation

## 2014-05-12 DIAGNOSIS — R112 Nausea with vomiting, unspecified: Secondary | ICD-10-CM

## 2014-05-12 DIAGNOSIS — I129 Hypertensive chronic kidney disease with stage 1 through stage 4 chronic kidney disease, or unspecified chronic kidney disease: Secondary | ICD-10-CM | POA: Diagnosis not present

## 2014-05-12 LAB — CBC WITH DIFFERENTIAL/PLATELET
BASOS PCT: 0 % (ref 0–1)
Basophils Absolute: 0 10*3/uL (ref 0.0–0.1)
EOS ABS: 0.1 10*3/uL (ref 0.0–0.7)
Eosinophils Relative: 1 % (ref 0–5)
HEMATOCRIT: 38.3 % (ref 36.0–46.0)
HEMOGLOBIN: 12 g/dL (ref 12.0–15.0)
Lymphocytes Relative: 25 % (ref 12–46)
Lymphs Abs: 3 10*3/uL (ref 0.7–4.0)
MCH: 27.8 pg (ref 26.0–34.0)
MCHC: 31.3 g/dL (ref 30.0–36.0)
MCV: 88.7 fL (ref 78.0–100.0)
MONO ABS: 1.2 10*3/uL — AB (ref 0.1–1.0)
Monocytes Relative: 10 % (ref 3–12)
Neutro Abs: 7.8 10*3/uL — ABNORMAL HIGH (ref 1.7–7.7)
Neutrophils Relative %: 64 % (ref 43–77)
Platelets: 507 10*3/uL — ABNORMAL HIGH (ref 150–400)
RBC: 4.32 MIL/uL (ref 3.87–5.11)
RDW: 14.4 % (ref 11.5–15.5)
WBC: 12.1 10*3/uL — ABNORMAL HIGH (ref 4.0–10.5)

## 2014-05-12 LAB — URINALYSIS, ROUTINE W REFLEX MICROSCOPIC
BILIRUBIN URINE: NEGATIVE
Glucose, UA: NEGATIVE mg/dL
Hgb urine dipstick: NEGATIVE
Ketones, ur: NEGATIVE mg/dL
LEUKOCYTES UA: NEGATIVE
NITRITE: NEGATIVE
Protein, ur: >300 mg/dL — AB
SPECIFIC GRAVITY, URINE: 1.015 (ref 1.005–1.030)
UROBILINOGEN UA: 0.2 mg/dL (ref 0.0–1.0)
pH: 6.5 (ref 5.0–8.0)

## 2014-05-12 LAB — COMPREHENSIVE METABOLIC PANEL
ALBUMIN: 3.1 g/dL — AB (ref 3.5–5.2)
ALT: 12 U/L (ref 0–35)
ANION GAP: 14 (ref 5–15)
AST: 14 U/L (ref 0–37)
Alkaline Phosphatase: 133 U/L — ABNORMAL HIGH (ref 39–117)
BILIRUBIN TOTAL: 0.3 mg/dL (ref 0.3–1.2)
BUN: 40 mg/dL — ABNORMAL HIGH (ref 6–23)
CALCIUM: 9.9 mg/dL (ref 8.4–10.5)
CHLORIDE: 98 meq/L (ref 96–112)
CO2: 25 mEq/L (ref 19–32)
CREATININE: 2.9 mg/dL — AB (ref 0.50–1.10)
GFR calc Af Amer: 18 mL/min — ABNORMAL LOW (ref >90)
GFR calc non Af Amer: 16 mL/min — ABNORMAL LOW (ref >90)
Glucose, Bld: 221 mg/dL — ABNORMAL HIGH (ref 70–99)
Potassium: 3.9 mEq/L (ref 3.7–5.3)
Sodium: 137 mEq/L (ref 137–147)
TOTAL PROTEIN: 7.4 g/dL (ref 6.0–8.3)

## 2014-05-12 LAB — URINE MICROSCOPIC-ADD ON

## 2014-05-12 LAB — TROPONIN I: Troponin I: <0.3 ng/mL (ref <0.30)

## 2014-05-12 LAB — LIPASE, BLOOD: LIPASE: 25 U/L (ref 11–59)

## 2014-05-12 MED ORDER — METOCLOPRAMIDE HCL 5 MG/ML IJ SOLN
10.0000 mg | Freq: Once | INTRAMUSCULAR | Status: AC
  Administered 2014-05-12: 10 mg via INTRAVENOUS
  Filled 2014-05-12: qty 2

## 2014-05-12 MED ORDER — SODIUM CHLORIDE 0.9 % IV BOLUS (SEPSIS)
1000.0000 mL | Freq: Once | INTRAVENOUS | Status: AC
  Administered 2014-05-12: 1000 mL via INTRAVENOUS

## 2014-05-12 MED ORDER — ONDANSETRON HCL 4 MG/2ML IJ SOLN
4.0000 mg | Freq: Once | INTRAMUSCULAR | Status: AC
  Administered 2014-05-12: 4 mg via INTRAVENOUS
  Filled 2014-05-12: qty 2

## 2014-05-12 MED ORDER — ONDANSETRON 4 MG PO TBDP: 4.0000 mg | ORAL_TABLET | Freq: Three times a day (TID) | ORAL | Status: DC | PRN

## 2014-05-12 MED ORDER — MORPHINE SULFATE 4 MG/ML IJ SOLN
4.0000 mg | Freq: Once | INTRAMUSCULAR | Status: AC
  Administered 2014-05-12: 4 mg via INTRAVENOUS
  Filled 2014-05-12: qty 1

## 2014-05-12 MED ORDER — PROMETHAZINE HCL 25 MG/ML IJ SOLN
12.5000 mg | Freq: Once | INTRAMUSCULAR | Status: AC
  Administered 2014-05-12: 12.5 mg via INTRAVENOUS
  Filled 2014-05-12: qty 1

## 2014-05-12 MED ORDER — IOHEXOL 300 MG/ML  SOLN
50.0000 mL | Freq: Once | INTRAMUSCULAR | Status: AC | PRN
  Administered 2014-05-12: 50 mL via ORAL

## 2014-05-12 NOTE — ED Notes (Addendum)
Pt c/o abdominal pain x 3 days associated with nausea and vomiting. Reports increased pain in abdomen when trying to urinate, denies burning; last bm yesterday. Has taken laxatives without relieve. Reports chills. Abdomen soft and nontender. Pt reports shortness of breath which is normal for her, hx of sarcosis.

## 2014-05-12 NOTE — ED Provider Notes (Signed)
CSN: [id number]     Arrival date & time 05/12/14  0932 History   First MD Initiated Contact with Patient 05/12/14 [phone]     Chief Complaint  Patient presents with  . Abdominal Pain     (Consider location/radiation/quality/duration/timing/severity/associated sxs/prior Treatment) HPI Comments: Patient is 68 yo female past medical history significant for HTN, COPD on 2 L of oxygen, HLD, CHF, diverticulosis, CKD presenting to the ED for 3 day history of generalized cramping abdominal pain, nausea, 2-3 episodes of non-bloody non-bilious emesis and 2 episodes of nonbloody diarrhea. Demotivated or aggravating factors. Patient states he's tried taking Pepto-Bismol, Laxatives with no improvement of her symptoms. Sick contacts. Abdominal surgical history includes appendectomy, cholecystectomy, abdominal hysterectomy, vesicovaginal fistula closure.    Past Medical History  Diagnosis Date  . Essential hypertension, benign   . Coronary atherosclerosis of native coronary artery 01/04/2006    Tiny OM1 70-90% ostial stenosis, no other CAD  . COPD (chronic obstructive pulmonary disease)   . Esophageal reflux   . Dyslipidemia   . Gout   . Colon polyps     Tubular adenomatous polyps  . Spinal stenosis of lumbar region   . Bulging lumbar disc   . Chronic diastolic heart failure   . PNA (pneumonia) 2012    With pleural effusion, requiring thoracentesis  . Chronic bronchitis   . On home oxygen therapy   . OSA (obstructive sleep apnea)     Marginally compliant with CPAP  . Type II diabetes mellitus   . Arthritis   . Chronic lower back pain   . Diverticulosis   . GERD (gastroesophageal reflux disease)   . Anxiety   . Hyperlipidemia   . Sarcoidosis of lung   . CKD (chronic kidney disease) stage 4, GFR 15-29 ml/min   . IBS (irritable bowel syndrome)   . Cataracts, both eyes   . YTKZSWFU(932.3)    Past Surgical History  Procedure Laterality Date  . Vesicovaginal fistula closure w/  total abdominal  hysterectomy  1992  . Bilateral total knee replacements Bilateral Rt=5/04 & Lft=1/09    by DrAlusio  . 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 Zella Richer  . Appendectomy  1957  . Tonsillectomy  1968  . Cholecystectomy  1980's  . Abdominal hysterectomy  1992  . Reduction mammaplasty Bilateral 1980's  . Cardiac catheterization  1990's  . Thoracentesis  2012  . Joint replacement    . Av fistula placement Left 12/31/2013    Procedure: ARTERIOVENOUS (AV) FISTULA CREATION;  Surgeon: Elam Dutch, MD;  Location: [place];  Service: Vascular;  Laterality: Left;  . Colonoscopy w/ biopsies and polypectomy    . Av fistula placement Left 03/18/2014    Procedure: ARTERIOVENOUS (AV) FISTULA CREATION- LEFT BRACHIOCEPHALIC ;  Surgeon: Elam Dutch, MD;  Location: [facility] OR;  Service: Vascular;  Laterality: Left;  . Ligation of arteriovenous  fistula Left 03/18/2014    Procedure: LIGATION OF ARTERIOVENOUS  FISTULA- LEFT RADIOCEPHALIC;  Surgeon: Elam Dutch, MD;  Location: [facility] OR;  Service: Vascular;  Laterality: Left;   Family History  Problem Relation Age of Onset  . Diabetes Mother   . Heart disease Mother   . Hyperlipidemia Mother   . Varicose Veins Mother   . Breast cancer Maternal Aunt   . Heart disease Father   . Deep vein thrombosis Father   . Hyperlipidemia Father   . Heart disease Sister     before age 65  . Cancer Sister   .  Diabetes Sister   . Hyperlipidemia Sister   . Heart attack Sister   . Heart disease Brother   . Diabetes Brother   . Hyperlipidemia Brother   . Heart attack Brother   . Hyperlipidemia Sister    History  Substance Use Topics  . Smoking status: Never Smoker   . Smokeless tobacco: Never Used  . Alcohol Use: No   OB History   Grav Para Term Preterm Abortions TAB SAB Ect Mult Living                 Review of Systems  Constitutional: Positive for chills. Negative for fever.  Respiratory: Negative for shortness of breath.   Cardiovascular: Negative for  chest pain.  Gastrointestinal: Positive for nausea, vomiting, abdominal pain and diarrhea.  All other systems reviewed and are negative.     Allergies  Adhesive; Avelox; Codeine; Guaifenesin; Latex; and Oxycodone  Home Medications   Prior to Admission medications   Medication Sig Start Date End Date Taking? Authorizing Provider  acetaminophen (TYLENOL) 500 MG tablet Take 500 mg by mouth 2 (two) times daily as needed (pain).    Yes Historical Provider, MD  albuterol (PROVENTIL HFA;VENTOLIN HFA) 108 (90 BASE) MCG/ACT inhaler Inhale 2 puffs into the lungs every 4 (four) hours as needed for wheezing or shortness of breath.    Yes Historical Provider, MD  albuterol (PROVENTIL) (2.5 MG/3ML) 0.083% nebulizer solution Take 2.5 mg by nebulization every 2 (two) hours as needed for wheezing or shortness of breath.   Yes Historical Provider, MD  Alpha-D-Galactosidase (BEANO PO) Take 1 tablet by mouth daily.   Yes Historical Provider, MD  Apremilast (OTEZLA) 30 MG TABS Take 30 mg by mouth daily.    Yes Historical Provider, MD  aspirin EC 81 MG tablet Take 81 mg by mouth daily.   Yes Historical Provider, MD  atorvastatin (LIPITOR) 20 MG tablet Take 20 mg by mouth at bedtime.   Yes Historical Provider, MD  cloNIDine (CATAPRES) 0.1 MG tablet Take 0.1 mg by mouth 2 (two) times daily.  02/12/14  Yes Historical Provider, MD  doxycycline (VIBRA-TABS) 100 MG tablet Take 100 mg by mouth every 12 (twelve) hours.   Yes Historical Provider, MD  esomeprazole (NEXIUM) 40 MG capsule Take 40 mg by mouth 2 (two) times daily before a meal.   Yes Historical Provider, MD  ferrous sulfate 325 (65 FE) MG tablet Take 325 mg by mouth daily with breakfast.   Yes Historical Provider, MD  fluticasone (FLONASE) 50 MCG/ACT nasal spray Place 2 sprays into both nostrils at bedtime.   Yes Historical Provider, MD  Fluticasone-Salmeterol (ADVAIR) 250-50 MCG/DOSE AEPB Inhale 1 puff into the lungs 2 (two) times daily.   Yes Historical  Provider, MD  guaiFENesin (MUCINEX) 600 MG 12 hr tablet Take 600 mg by mouth 2 (two) times daily as needed for cough or to loosen phlegm (congestion).    Yes Historical Provider, MD  hydrALAZINE (APRESOLINE) 10 MG tablet Take 10 mg by mouth 3 (three) times daily.   Yes Historical Provider, MD  hydrOXYzine (ATARAX/VISTARIL) 25 MG tablet Take 25 mg by mouth every 4 (four) hours as needed for itching.   Yes Historical Provider, MD  Insulin Detemir (LEVEMIR) 100 UNIT/ML Pen Inject 40 Units into the skin 2 (two) times daily. 05/01/14  Yes Hosie Poisson, MD  insulin lispro (HUMALOG KWIKPEN) 100 UNIT/ML SOPN Inject 35 Units into the skin 3 (three) times daily. Take 35 units  (+4-12 units based on sliding  scale) every morning, 40 units (+4-12 units) in the afternoon and 40 units (+4-12 units) in the evening.   Yes Historical Provider, MD  isosorbide dinitrate (ISORDIL) 20 MG tablet Take 20 mg by mouth 3 (three) times daily.   Yes Historical Provider, MD  ketorolac (ACULAR LS) 0.4 % SOLN Place 1 drop into both eyes 3 (three) times daily.    Yes Historical Provider, MD  Linaclotide (LINZESS) 290 MCG CAPS capsule Take 290 mcg by mouth every other day. 06/14/13  Yes Irene Shipper, MD  LORazepam (ATIVAN) 1 MG tablet Take 1 mg by mouth at bedtime as needed for sleep.    Yes Historical Provider, MD  metoprolol tartrate (LOPRESSOR) 25 MG tablet Take 12.5 mg by mouth 2 (two) times daily.   Yes Historical Provider, MD  Multiple Vitamins-Minerals (MULTIVITAMIN WITH MINERALS) tablet Take 1 tablet by mouth daily.   Yes Historical Provider, MD  oxyCODONE-acetaminophen (PERCOCET/ROXICET) 5-325 MG per tablet Take 1 tablet by mouth daily as needed for severe pain.   Yes Historical Provider, MD  predniSONE (DELTASONE) 5 MG tablet Take 5 mg by mouth daily with breakfast.   Yes Historical Provider, MD  Probiotic Product (PROBIOTIC PO) Take 1 capsule by mouth daily.   Yes Historical Provider, MD  triamcinolone ointment (KENALOG) 0.1  % Apply 1 application topically 2 (two) times daily as needed (psoriasis).   Yes Historical Provider, MD  ondansetron (ZOFRAN ODT) 4 MG disintegrating tablet Take 1 tablet (4 mg total) by mouth every 8 (eight) hours as needed for nausea or vomiting. 05/12/14   Jourdain Guay L Breann Losano, PA-C   BP 161/48  Pulse 85  Temp(Src) 98.3 F (36.8 C) (Oral)  Resp 18  SpO2 100% Physical Exam  Constitutional: She is oriented to person, place, and time. She appears well-developed and well-nourished. No distress.  Uncomfortable appearing  HENT:  Head: Normocephalic and atraumatic.  Right Ear: External ear normal.  Left Ear: External ear normal.  Nose: Nose normal.  Mouth/Throat: Oropharynx is clear and moist. No oropharyngeal exudate.  Eyes: Conjunctivae are normal.  Neck: Neck supple.  Cardiovascular: Normal rate, regular rhythm and normal heart sounds.   Pulmonary/Chest: Breath sounds normal. No accessory muscle usage. Tachypnea noted. No respiratory distress. She exhibits no tenderness.  Abdominal: Soft. Bowel sounds are normal. There is tenderness in the epigastric area and periumbilical area. There is no rigidity, no rebound and no guarding.  Musculoskeletal: She exhibits no edema.  Neurological: She is alert and oriented to person, place, and time.  Skin: Skin is warm and dry. She is not diaphoretic.    ED Course  Procedures (including critical care time) Medications  sodium chloride 0.9 % bolus 1,000 mL (0 mLs Intravenous Stopped 05/12/14 1330)  ondansetron (ZOFRAN) injection 4 mg (4 mg Intravenous Given 05/12/14 1013)  morphine 4 MG/ML injection 4 mg (4 mg Intravenous Given 05/12/14 1013)  iohexol (OMNIPAQUE) 300 MG/ML solution 50 mL (50 mLs Oral Contrast Given 05/12/14 1032)  metoCLOPramide (REGLAN) injection 10 mg (10 mg Intravenous Given 05/12/14 1243)  promethazine (PHENERGAN) injection 12.5 mg (12.5 mg Intravenous Given 05/12/14 1358)    Labs Review Labs Reviewed  CBC WITH  DIFFERENTIAL - Abnormal; Notable for the following:    WBC 12.1 (*)    Platelets 507 (*)    Neutro Abs 7.8 (*)    Monocytes Absolute 1.2 (*)    All other components within normal limits  COMPREHENSIVE METABOLIC PANEL - Abnormal; Notable for the following:    Glucose,  Bld 221 (*)    BUN 40 (*)    Creatinine, Ser 2.90 (*)    Albumin 3.1 (*)    Alkaline Phosphatase 133 (*)    GFR calc non Af Amer 16 (*)    GFR calc Af Amer 18 (*)    All other components within normal limits  URINALYSIS, ROUTINE W REFLEX MICROSCOPIC - Abnormal; Notable for the following:    Protein, ur >300 (*)    All other components within normal limits  URINE MICROSCOPIC-ADD ON - Abnormal; Notable for the following:    Squamous Epithelial / LPF FEW (*)    All other components within normal limits  URINE CULTURE  TROPONIN I  LIPASE, BLOOD    Imaging Review Ct Abdomen Pelvis Wo Contrast  05/12/2014   CLINICAL DATA:  Generalized abdominal pain for 3 days. History of diabetes, diverticulosis, chronic kidney disease. Prior cholecystectomy, hysterectomy, appendectomy.  EXAM: CT ABDOMEN AND PELVIS WITHOUT CONTRAST  TECHNIQUE: Multidetector CT imaging of the abdomen and pelvis was performed following the standard protocol without IV contrast.  COMPARISON:  08/25/2013  FINDINGS: Heart is normal size. Linear scarring in the lung bases. No effusions.  Prior cholecystectomy. Prior splenectomy. Multiple nodules in the left upper quadrant likely reflect numerous small accessory splenules in the splenectomy bed. Findings are stable since prior study.  Partially calcified low-density lesion noted posteriorly in the right hepatic lobe measuring approximately 13 mm, stable since prior study compatible with a benign process. No other liver lesions noted on this noncontrast study. Pancreas, adrenal glands, kidneys have an unremarkable unenhanced appearance. No renal or ureteral stones. No hydronephrosis. Urinary bladder is unremarkable.   Descending colonic and sigmoid diverticulosis. No active diverticulitis. Stomach and small bowel are decompressed, grossly unremarkable. Aorta is normal caliber. No free fluid, free air or adenopathy.  No acute bony abnormality. Degenerative changes in the thoracolumbar spine.  Skin thickening and underlying subcutaneous stranding noted in the anterior abdominal and pelvic wall, stable since prior study.  IMPRESSION: No acute findings in the abdomen or pelvis.  Prior cholecystectomy, splenectomy and hysterectomy.  Left colonic diverticulosis.   Electronically Signed   By: Rolm Baptise M.D.   On: 05/12/2014 13:09     EKG Interpretation   Date/Time:  Sunday May 12 2014 09:50:21 EDT Ventricular Rate:  84 PR Interval:  141 QRS Duration: 92 QT Interval:  467 QTC Calculation: 552 R Axis:   68 Text Interpretation:  Sinus rhythm Ventricular premature complex RSR' in  V1 or V2, probably normal variant Prolonged QT interval Artifact in  lead(s) I III aVR aVL aVF V1 V2 No significant change since last tracing  Confirmed by POLLINA  MD, CHRISTOPHER ([id number]) on 05/12/2014 9:56:53 AM      MDM   Final diagnoses:  Nausea vomiting and diarrhea  Generalized abdominal pain    Filed Vitals:   05/12/14 1519  BP: 161/48  Pulse: 85  Temp: 98.3 F (36.8 C)  Resp: 18   Afebrile, NAD, non-toxic appearing, AAOx4.   Patient is nontoxic, nonseptic appearing, in no apparent distress.  Patient's pain and other symptoms adequately managed in emergency department.  Fluid bolus given.  Labs, imaging and vitals reviewed.  Patient does not meet the SIRS or Sepsis criteria.  On repeat exam patient does not have a surgical abdomin and there are no peritoneal signs.  No indication of appendicitis, bowel obstruction, bowel perforation, cholecystitis, diverticulitis, PID or ectopic pregnancy.  Patient discharged home with symptomatic treatment and given strict  instructions for follow-up with their primary care  physician.  I have also discussed reasons to return immediately to the ER.  Patient expresses understanding and agrees with plan. Patient is stable at time of discharge. Patient d/w with Dr. Betsey Holiday, agrees with plan.          Harlow Mares, PA-C 05/12/14 1631

## 2014-05-12 NOTE — ED Notes (Signed)
Patient returned from CT

## 2014-05-12 NOTE — ED Notes (Signed)
Pt to ED c/o generalized abdominal pain. Pt has a fistula in L arm; has not started dialysis at this time. Pain described as a sharp nagging pain associated with nausea.

## 2014-05-12 NOTE — ED Provider Notes (Signed)
Patient presented to the ER with abdominal pain. Patient experiencing 3 days of generalized abdominal cramping pain. Has used laxatives without improvement.  Face to face Exam: HEENT - PERRLA Lungs - CTAB Heart - RRR, no M/R/G Abd - distended, generalized tenderness without guarding or rebound Neuro - alert, oriented x3  Plan: Labs and CT abdomen to evaluate for cause of abd pain.   Orpah Greek, MD 05/12/14 1024

## 2014-05-12 NOTE — ED Notes (Signed)
Jen, PA at bedside.

## 2014-05-12 NOTE — Discharge Instructions (Signed)
Please follow up with your primary care physician in 1-2 days. If you do not have one please call the [facility] number listed above. Please take medications as prescribed. Please read all discharge instructions and return precautions.    Abdominal Pain Many things can cause abdominal pain. Usually, abdominal pain is not caused by a disease and will improve without treatment. It can often be observed and treated at home. Your health care provider will do a physical exam and possibly order blood tests and X-rays to help determine the seriousness of your pain. However, in many cases, more time must pass before a clear cause of the pain can be found. Before that point, your health care provider may not know if you need more testing or further treatment. HOME CARE INSTRUCTIONS  Monitor your abdominal pain for any changes. The following actions may help to alleviate any discomfort you are experiencing:  Only take over-the-counter or prescription medicines as directed by your health care provider.  Do not take laxatives unless directed to do so by your health care provider.  Try a clear liquid diet (broth, tea, or water) as directed by your health care provider. Slowly move to a bland diet as tolerated. SEEK MEDICAL CARE IF:  You have unexplained abdominal pain.  You have abdominal pain associated with nausea or diarrhea.  You have pain when you urinate or have a bowel movement.  You experience abdominal pain that wakes you in the night.  You have abdominal pain that is worsened or improved by eating food.  You have abdominal pain that is worsened with eating fatty foods.  You have a fever. SEEK IMMEDIATE MEDICAL CARE IF:   Your pain does not go away within 2 hours.  You keep throwing up (vomiting).  Your pain is felt only in portions of the abdomen, such as the right side or the left lower portion of the abdomen.  You pass bloody or black tarry stools. MAKE SURE  YOU:  Understand these instructions.   Will watch your condition.   Will get help right away if you are not doing well or get worse.  Document Released: 04/28/2005 Document Revised: 07/24/2013 Document Reviewed: 03/28/2013 [facility] Patient Information 2015 [place], [place]. This information is not intended to replace advice given to you by your health care provider. Make sure you discuss any questions you have with your health care provider. Viral Gastroenteritis Viral gastroenteritis is also known as stomach flu. This condition affects the stomach and intestinal tract. It can cause sudden diarrhea and vomiting. The illness typically lasts 3 to 8 days. Most people develop an immune response that eventually gets rid of the virus. While this natural response develops, the virus can make you quite ill. CAUSES  Many different viruses can cause gastroenteritis, such as rotavirus or noroviruses. You can catch one of these viruses by consuming contaminated food or water. You may also catch a virus by sharing utensils or other personal items with an infected person or by touching a contaminated surface. SYMPTOMS  The most common symptoms are diarrhea and vomiting. These problems can cause a severe loss of body fluids (dehydration) and a body salt (electrolyte) imbalance. Other symptoms may include:  Fever.  Headache.  Fatigue.  Abdominal pain. DIAGNOSIS  Your caregiver can usually diagnose viral gastroenteritis based on your symptoms and a physical exam. A stool sample may also be taken to test for the presence of viruses or other infections. TREATMENT  This illness typically  goes away on its own. Treatments are aimed at rehydration. The most serious cases of viral gastroenteritis involve vomiting so severely that you are not able to keep fluids down. In these cases, fluids must be given through an intravenous line (IV). HOME CARE INSTRUCTIONS   Drink enough fluids to keep your urine clear or  pale yellow. Drink small amounts of fluids frequently and increase the amounts as tolerated.  Ask your caregiver for specific rehydration instructions.  Avoid:  Foods high in sugar.  Alcohol.  Carbonated drinks.  Tobacco.  Juice.  Caffeine drinks.  Extremely hot or cold fluids.  Fatty, greasy foods.  Too much intake of anything at one time.  Dairy products until 24 to 48 hours after diarrhea stops.  You may consume probiotics. Probiotics are active cultures of beneficial bacteria. They may lessen the amount and number of diarrheal stools in adults. Probiotics can be found in yogurt with active cultures and in supplements.  Wash your hands well to avoid spreading the virus.  Only take over-the-counter or prescription medicines for pain, discomfort, or fever as directed by your caregiver. Do not give aspirin to children. Antidiarrheal medicines are not recommended.  Ask your caregiver if you should continue to take your regular prescribed and over-the-counter medicines.  Keep all follow-up appointments as directed by your caregiver. SEEK IMMEDIATE MEDICAL CARE IF:   You are unable to keep fluids down.  You do not urinate at least once every 6 to 8 hours.  You develop shortness of breath.  You notice blood in your stool or vomit. This may look like coffee grounds.  You have abdominal pain that increases or is concentrated in one small area (localized).  You have persistent vomiting or diarrhea.  You have a fever.  The patient is a child younger than 3 months, and he or she has a fever.  The patient is a child older than 3 months, and he or she has a fever and persistent symptoms.  The patient is a child older than 3 months, and he or she has a fever and symptoms suddenly get worse.  The patient is a baby, and he or she has no tears when crying. MAKE SURE YOU:   Understand these instructions.  Will watch your condition.  Will get help right away if you are  not doing well or get worse. Document Released: 07/19/2005 Document Revised: 10/11/2011 Document Reviewed: 05/05/2011 [facility] Patient Information 2015 [place], [place]. This information is not intended to replace advice given to you by your health care provider. Make sure you discuss any questions you have with your health care provider.

## 2014-05-15 LAB — URINE CULTURE

## 2014-05-16 NOTE — ED Provider Notes (Signed)
Medical screening examination/treatment/procedure(s) were conducted as a shared visit with non-physician practitioner(s) and myself.  I personally evaluated the patient during the encounter.  Please see separate associated note for evaluation and plan.    EKG Interpretation   Date/Time:  Sunday May 12 2014 09:50:21 EDT Ventricular Rate:  84 PR Interval:  141 QRS Duration: 92 QT Interval:  467 QTC Calculation: 552 R Axis:   68 Text Interpretation:  Sinus rhythm Ventricular premature complex RSR' in  V1 or V2, probably normal variant Prolonged QT interval Artifact in  lead(s) I III aVR aVL aVF V1 V2 No significant change since last tracing  Confirmed by Ketara Cavness  MD, Davison Ohms [phone]) on 05/12/2014 9:56:53 AM       Orpah Greek, MD 05/16/14 0730

## 2014-05-20 ENCOUNTER — Encounter: Payer: Self-pay | Admitting: Cardiovascular Disease

## 2014-05-22 ENCOUNTER — Encounter: Payer: Medicare Other | Admitting: Cardiovascular Disease

## 2014-05-23 ENCOUNTER — Encounter: Payer: Self-pay | Admitting: Cardiovascular Disease

## 2014-05-24 ENCOUNTER — Telehealth: Payer: Self-pay

## 2014-05-24 NOTE — Telephone Encounter (Signed)
Called patient with BNP lab results.  Pt st her feet are swollen and tender. She st her left foot is sorer than the right. She st she has to use her oxygen more frequently (when she leaves the house, while exercising, and doing housework) and she turned it up from 2L to 3L.  She also wants to know if she can start using her CPAP again.   Routing to Dr. Radford Pax for review and recommendations.

## 2014-05-27 NOTE — Telephone Encounter (Signed)
Regarding increased O2 requirements she needs to check with her Pulmonologist

## 2014-05-27 NOTE — Telephone Encounter (Signed)
Please find out who had treated her sleep apnea in the past

## 2014-05-28 ENCOUNTER — Telehealth: Payer: Self-pay | Admitting: Pulmonary Disease

## 2014-05-28 NOTE — Telephone Encounter (Signed)
Patient states Dr. Halford Chessman with [facility] treated her sleep apnea.  Instructed patient to call her pulmonologist for increased O2 requirements.  Patient states she'll call pulmonology today.

## 2014-05-28 NOTE — Telephone Encounter (Signed)
Per SN: no hypoxemia on file (98% at [facility] 10/7; 100% in ER 10/11 on 3lpm; 99% in ER 9/26, 99% at 9/3 ov w/ Dr Oneida Alar).  Therefore would recommend she can increase O2 to MAXIMUM 2-3Lpm with exertion - no higher than this.  Thanks.  ATC pt, line rang numerous times with no answer and no option to LM.  WCB.

## 2014-05-28 NOTE — Telephone Encounter (Signed)
Pt c/o increased SOB on exertion.   Having to use oxygen more with exertion for past 6 weeks.  Currently being treated by primary doctor for URI.  Will finish Doxycycline tomorrow.  Pt states she does not know what her oxygen level was at ov with primary dr.  Please advise.

## 2014-05-29 NOTE — Telephone Encounter (Signed)
Spoke with patient- she is aware of recs from SN-if any troubles with SOB getting worse while at 3L/M O2 setting then patient will contact our office. Nothing more needed at this time.

## 2014-06-06 ENCOUNTER — Other Ambulatory Visit (HOSPITAL_COMMUNITY): Payer: Medicare Other

## 2014-06-06 ENCOUNTER — Ambulatory Visit: Payer: Medicare Other | Admitting: Vascular Surgery

## 2014-06-18 ENCOUNTER — Encounter: Payer: Self-pay | Admitting: Vascular Surgery

## 2014-06-19 ENCOUNTER — Ambulatory Visit (HOSPITAL_COMMUNITY)
Admission: RE | Admit: 2014-06-19 | Discharge: 2014-06-19 | Disposition: A | Discharge status: Home / Self Care | Payer: Medicare Other | Source: Ambulatory Visit | Attending: Vascular Surgery | Admitting: Vascular Surgery

## 2014-06-19 ENCOUNTER — Ambulatory Visit (INDEPENDENT_AMBULATORY_CARE_PROVIDER_SITE_OTHER): Payer: Medicare Other | Admitting: Vascular Surgery

## 2014-06-19 ENCOUNTER — Encounter: Payer: Self-pay | Admitting: Vascular Surgery

## 2014-06-19 VITALS — BP 149/69 | HR 63 | Resp 18 | Ht 62.5 in | Wt 230.0 lb

## 2014-06-19 DIAGNOSIS — N186 End stage renal disease: Secondary | ICD-10-CM | POA: Diagnosis present

## 2014-06-19 DIAGNOSIS — Z4931 Encounter for adequacy testing for hemodialysis: Secondary | ICD-10-CM | POA: Diagnosis not present

## 2014-06-19 DIAGNOSIS — N184 Chronic kidney disease, stage 4 (severe): Secondary | ICD-10-CM

## 2014-06-19 NOTE — Progress Notes (Signed)
Patient returns for followup today. She recently had ligation of a non-maturing left radiocephalic AV fistula and placement of a new left brachiocephalic AV fistula on August 17. She states the numbness and tingling in her left hand is intermittent but improved. She is currently not on dialysis. Her incisions have healed and no drainage.  Physical exam:  Filed Vitals:   06/19/14 1612  BP: 149/69  Pulse: 63  Resp: 18  Height: 5' 2.5" (1.588 m)  Weight: 230 lb (104.327 kg)    Left upper extremity: Healed radiocephalic incision no audible bruit in the left forearm, healed antecubital incision palpable thrill and audible bruit left upper arm the fistula is easily palpable on the skin surface.  Obese upper arm  Data: Patient has a duplex ultrasound of her AV fistula today. This showed no narrowing. Fistula diameter is 7-11 mm in diameter. It is less than 4 mm from the skin surface from the mid arm to the antecubital region. The fistula is slightly deeper in the upper arm.  Assessment: Patent left brachiocephalic AV fistula. Fistula is ready for use if needed  Plan: follow-up as needed Ruta Hinds, MD [facility] Office: [phone] Pager: [phone]

## 2014-06-20 ENCOUNTER — Ambulatory Visit: Payer: Self-pay | Admitting: Podiatry

## 2014-06-21 ENCOUNTER — Ambulatory Visit: Payer: Medicare Other | Admitting: Pulmonary Disease

## 2014-06-21 ENCOUNTER — Ambulatory Visit (INDEPENDENT_AMBULATORY_CARE_PROVIDER_SITE_OTHER): Payer: Medicare Other | Admitting: Cardiology

## 2014-06-21 ENCOUNTER — Encounter: Payer: Self-pay | Admitting: Cardiology

## 2014-06-21 VITALS — BP 148/74 | HR 76 | Ht 62.5 in | Wt 230.0 lb

## 2014-06-21 DIAGNOSIS — E785 Hyperlipidemia, unspecified: Secondary | ICD-10-CM

## 2014-06-21 DIAGNOSIS — G4733 Obstructive sleep apnea (adult) (pediatric): Secondary | ICD-10-CM

## 2014-06-21 DIAGNOSIS — I5032 Chronic diastolic (congestive) heart failure: Secondary | ICD-10-CM

## 2014-06-21 DIAGNOSIS — N184 Chronic kidney disease, stage 4 (severe): Secondary | ICD-10-CM

## 2014-06-21 DIAGNOSIS — N183 Chronic kidney disease, stage 3 unspecified: Secondary | ICD-10-CM

## 2014-06-21 DIAGNOSIS — J42 Unspecified chronic bronchitis: Secondary | ICD-10-CM

## 2014-06-21 DIAGNOSIS — I25111 Atherosclerotic heart disease of native coronary artery with angina pectoris with documented spasm: Secondary | ICD-10-CM

## 2014-06-21 DIAGNOSIS — D869 Sarcoidosis, unspecified: Secondary | ICD-10-CM

## 2014-06-21 DIAGNOSIS — E0821 Diabetes mellitus due to underlying condition with diabetic nephropathy: Secondary | ICD-10-CM

## 2014-06-21 DIAGNOSIS — I10 Essential (primary) hypertension: Secondary | ICD-10-CM

## 2014-06-21 MED ORDER — HYDRALAZINE HCL 25 MG PO TABS: 25.0000 mg | ORAL_TABLET | Freq: Three times a day (TID) | ORAL | Status: DC

## 2014-06-21 NOTE — Progress Notes (Signed)
- [place], [address], [place]  [address] Phone: [phone] Fax:  [phone]  Date:  06/21/2014   ID:  [name], [name] 1945/10/11, MRN [id number]  PCP:  [name] Blackbird, MD  Cardiologist:  [name] Him, MD    History of Present Illness: [name] is a 68 y.o. female chronic diastolic CHF, COPD on chronic O2 at 3L, morbid obesity, sarcoidosis, OSA on CPAP, CKD stage IV and HTN who presents today for followup.  She was hospitalized in September with acute respiratory failure from  acute COPD exacerbation with acute bronchitis.  She was treated with antibiotics.  She had an elevated BNP and was felt to have mild acute on chronic diastolic CHF and was diuresed.  She presents today for followup.  She denies any chest pain.  She has chronic SOB which has gotten worse.  She also has had some increasing LE edema.  Her weight is up 9 pounds from last month.  She denies any irregularity to her HR, dizziness or syncope.  She sleeps on a hospital bed and cannot lay flat due to SOB.  She has significant orthopnea and has to sleep almost sitting up sometimes.  She has had some PND recently.  She was on CPAP but that was cancelled by insurance because she was not compliant.   Wt Readings from Last 3 Encounters:  06/21/14 230 lb (104.327 kg)  06/19/14 230 lb (104.327 kg)  05/08/14 221 lb 9.6 oz (100.517 kg)     Past Medical History  Diagnosis Date  . Essential hypertension, benign   . Coronary atherosclerosis of native coronary artery 01/04/2006    Tiny OM1 70-90% ostial stenosis, no other CAD  . COPD (chronic obstructive pulmonary disease)   . Esophageal reflux   . Dyslipidemia   . Gout   . Colon polyps     Tubular adenomatous polyps  . Spinal stenosis of lumbar region   . Bulging lumbar disc   . Chronic diastolic heart failure   . PNA (pneumonia) 2012    With pleural effusion, requiring thoracentesis  . Chronic bronchitis   . On home oxygen therapy   . OSA  (obstructive sleep apnea)     Marginally compliant with CPAP  . Type II diabetes mellitus   . Arthritis   . Chronic lower back pain   . Diverticulosis   . GERD (gastroesophageal reflux disease)   . Anxiety   . Hyperlipidemia   . Sarcoidosis of lung   . CKD (chronic kidney disease) stage 4, GFR 15-29 ml/min   . IBS (irritable bowel syndrome)   . Cataracts, both eyes   . EQASTMHD(622.2)     Current Outpatient Prescriptions  Medication Sig Dispense Refill  . acetaminophen (TYLENOL) 500 MG tablet Take 500 mg by mouth 2 (two) times daily as needed (pain).     Marland Kitchen albuterol (PROVENTIL HFA;VENTOLIN HFA) 108 (90 BASE) MCG/ACT inhaler Inhale 2 puffs into the lungs every 4 (four) hours as needed for wheezing or shortness of breath.     Marland Kitchen albuterol (PROVENTIL) (2.5 MG/3ML) 0.083% nebulizer solution Take 2.5 mg by nebulization every 2 (two) hours as needed for wheezing or shortness of breath.    . Alpha-D-Galactosidase (BEANO PO) Take 1 tablet by mouth daily.    Marland Kitchen Apremilast (OTEZLA) 30 MG TABS Take 30 mg by mouth daily.     Marland Kitchen aspirin EC 81 MG tablet Take 81 mg by mouth daily.    Marland Kitchen atorvastatin (LIPITOR)  20 MG tablet Take 20 mg by mouth at bedtime.    . cloNIDine (CATAPRES) 0.1 MG tablet Take 0.1 mg by mouth 2 (two) times daily.     Marland Kitchen esomeprazole (NEXIUM) 40 MG capsule Take 40 mg by mouth 2 (two) times daily before a meal.    . ferrous sulfate 325 (65 FE) MG tablet Take 325 mg by mouth daily with breakfast.    . fluticasone (FLONASE) 50 MCG/ACT nasal spray Place 2 sprays into both nostrils at bedtime.    . Fluticasone-Salmeterol (ADVAIR) 250-50 MCG/DOSE AEPB Inhale 1 puff into the lungs 2 (two) times daily.    Marland Kitchen guaiFENesin (MUCINEX) 600 MG 12 hr tablet Take 600 mg by mouth 2 (two) times daily as needed for cough or to loosen phlegm (congestion).     . hydrALAZINE (APRESOLINE) 10 MG tablet Take 10 mg by mouth 3 (three) times daily.    . hydrOXYzine (ATARAX/VISTARIL) 25 MG tablet Take 25 mg by  mouth every 4 (four) hours as needed for itching.    . Insulin Detemir (LEVEMIR) 100 UNIT/ML Pen Inject 40 Units into the skin 2 (two) times daily.    . insulin lispro (HUMALOG KWIKPEN) 100 UNIT/ML SOPN Inject 35 Units into the skin 3 (three) times daily. Take 35 units  (+4-12 units based on sliding scale) every morning, 40 units (+4-12 units) in the afternoon and 40 units (+4-12 units) in the evening.    . isosorbide dinitrate (ISORDIL) 20 MG tablet Take 20 mg by mouth 3 (three) times daily.    Marland Kitchen ketorolac (ACULAR LS) 0.4 % SOLN Place 1 drop into both eyes 3 (three) times daily.     . Linaclotide (LINZESS) 290 MCG CAPS capsule Take 290 mcg by mouth every other day.    Marland Kitchen LORazepam (ATIVAN) 1 MG tablet Take 1 mg by mouth at bedtime as needed for sleep.     . metoprolol tartrate (LOPRESSOR) 25 MG tablet Take 12.5 mg by mouth 2 (two) times daily.    . Multiple Vitamins-Minerals (MULTIVITAMIN WITH MINERALS) tablet Take 1 tablet by mouth daily.    . predniSONE (DELTASONE) 5 MG tablet Take 5 mg by mouth daily with breakfast.    . Probiotic Product (PROBIOTIC PO) Take 1 capsule by mouth daily.    Marland Kitchen torsemide (DEMADEX) 20 MG tablet Take 20 mg by mouth daily. 2 tablets once daily    . triamcinolone ointment (KENALOG) 0.1 % Apply 1 application topically 2 (two) times daily as needed (psoriasis).     No current facility-administered medications for this visit.    Allergies:    Allergies  Allergen Reactions  . Adhesive [Tape] Other (See Comments)    Blisters   . Avelox [Moxifloxacin Hcl In Nacl] Other (See Comments)    GI upset  . Codeine Other (See Comments)    "crazy"   . Guaifenesin Nausea And Vomiting    Takes Mucinex at home without issue  . Latex Swelling  . Oxycodone Nausea And Vomiting    Takes Percocet at home without issue    Social History:  The patient  reports that she has never smoked. She has never used smokeless tobacco. She reports that she does not drink alcohol or use  illicit drugs.   Family History:  The patient's family history includes Breast cancer in her maternal aunt; Cancer in her sister; Deep vein thrombosis in her father; Diabetes in her brother, mother, and sister; Heart attack in her brother and sister; Heart disease in  her brother, father, mother, and sister; Hyperlipidemia in her brother, father, mother, sister, and sister; Varicose Veins in her mother.   ROS:  Please see the history of present illness.      All other systems reviewed and negative.   PHYSICAL EXAM: VS:  BP 148/74 mmHg  Pulse 76  Ht 5' 2.5" (1.588 m)  Wt 230 lb (104.327 kg)  BMI 41.37 kg/m2  SpO2 99% Well nourished, well developed, in no acute distress HEENT: normal Neck: no JVD Cardiac:  normal S1, S2; RRR; no murmur Lungs:  clear to auscultation bilaterally, no wheezing, rhonchi or rales Abd: soft, nontender, no hepatomegaly Ext: no edema Skin: warm and dry Neuro:  CNs 2-12 intact, no focal abnormalities noted      ASSESSMENT AND PLAN:  1. Chronic diastolic CHF - she has had worsening SOB and her weight is up but her lungs are clear.  She does have some LE edema. It is hard to tell how much of this SOB is due to CHF and how much is due to COPD.  I will check a BNP to assess further. Check BMET.   Adjust torsemide based on results of labs. 2. HTN - borderline control.  Continue clonidine/Hydralazine/metoprolol.  Increase Hydralazine to 25mg  TID for better BP control.   3. ASCAD with small OM 70-90% stenosis with no other CAD.  Continue ASA/statin/Imdur 4. COPD - per pulmonary 5. Dyslipidemia - continue statin.  Check FLP and ALT 6. Type II DM complicated by CKD - per PCP  7. CKD stage IV - per PCP 8. Sarcoidosis of the lung - per pulmonary  Followup with PA in office in 2 weeks  Followup with me in 3 months  Signed, [name] Him, MD [facility] 06/21/2014 3:27 PM

## 2014-06-21 NOTE — Patient Instructions (Addendum)
Your physician recommends that you return for lab work on Monday, November 23rd for FASTING LABS (lipids, ALT, BMET, BNP).  Your physician has recommended you make the following change in your medication:  1) INCREASE Hydralazine to 25 mg three times a day.   Your physician recommends that you schedule a follow-up appointment in: 2 weeks with a PA.  Your physician recommends that you schedule a follow-up appointment in: 3 months with Dr. Radford Pax.  Please check with your Pulmonologist regarding your CPAP.

## 2014-06-24 ENCOUNTER — Encounter: Payer: Self-pay | Admitting: Pulmonary Disease

## 2014-06-24 ENCOUNTER — Ambulatory Visit (INDEPENDENT_AMBULATORY_CARE_PROVIDER_SITE_OTHER): Payer: Medicare Other | Admitting: Pulmonary Disease

## 2014-06-24 ENCOUNTER — Other Ambulatory Visit (INDEPENDENT_AMBULATORY_CARE_PROVIDER_SITE_OTHER): Payer: Medicare Other | Admitting: *Deleted

## 2014-06-24 VITALS — BP 118/86 | HR 88 | Ht 62.0 in | Wt 230.0 lb

## 2014-06-24 DIAGNOSIS — I25111 Atherosclerotic heart disease of native coronary artery with angina pectoris with documented spasm: Secondary | ICD-10-CM

## 2014-06-24 DIAGNOSIS — E785 Hyperlipidemia, unspecified: Secondary | ICD-10-CM

## 2014-06-24 DIAGNOSIS — G4733 Obstructive sleep apnea (adult) (pediatric): Secondary | ICD-10-CM

## 2014-06-24 DIAGNOSIS — I10 Essential (primary) hypertension: Secondary | ICD-10-CM

## 2014-06-24 DIAGNOSIS — I5032 Chronic diastolic (congestive) heart failure: Secondary | ICD-10-CM

## 2014-06-24 LAB — BASIC METABOLIC PANEL
BUN: 64 mg/dL — ABNORMAL HIGH (ref 6–23)
CALCIUM: 9.2 mg/dL (ref 8.4–10.5)
CO2: 27 mEq/L (ref 19–32)
Chloride: 101 mEq/L (ref 96–112)
Creatinine, Ser: 3 mg/dL — ABNORMAL HIGH (ref 0.4–1.2)
GFR: 19.83 mL/min — ABNORMAL LOW (ref >60.00)
Glucose, Bld: 79 mg/dL (ref 70–99)
Potassium: 3.7 mEq/L (ref 3.5–5.1)
Sodium: 141 mEq/L (ref 135–145)

## 2014-06-24 LAB — LIPID PANEL
Cholesterol: 116 mg/dL (ref 0–200)
HDL: 36.7 mg/dL — ABNORMAL LOW (ref >39.00)
LDL Cholesterol: 56 mg/dL (ref 0–99)
NonHDL: 79.3
TRIGLYCERIDES: 116 mg/dL (ref 0.0–149.0)
Total CHOL/HDL Ratio: 3
VLDL: 23.2 mg/dL (ref 0.0–40.0)

## 2014-06-24 LAB — BRAIN NATRIURETIC PEPTIDE: PRO B NATRI PEPTIDE: 324 pg/mL — AB (ref 0.0–100.0)

## 2014-06-24 LAB — ALT: ALT: 26 U/L (ref 0–35)

## 2014-06-24 NOTE — Progress Notes (Signed)
Chief Complaint  Patient presents with  . Follow-up    Pt states that CPAP machine has been "cut off" by DME d/t non-compliance. Pt states that she was not aware of compliance rules. Would like to discuss starting back.     History of Present Illness: [name] is a 68 y.o. female with OSA.  She had her CPAP machine turned off.  She was told she was not compliant with CPAP and therefore was not allowed to use it.  She apparently still has the CPAP, and is still paying for the device.  She is using 3 liters oxygen.  She still has snoring, and stops breathing while asleep.  She wakes up frequently at night, and is sleepy during the day.  Her Epworth score is 14 out of 24.  TESTS: PSG November 2007 >> RDI 21, SaO2 low 62% Echo 04/03/13 >> EF 60 to 65%, mild LVH, grade 2 diastolic dysfx, mild AS PSG 01/25/14 >> AHI 16.4, SaO2 low 79%. CPAP 13 cm H2O >> AHI 0, + R, +S. Central's with higher pressures.  Past medical hx >> CAD, HTN, GERD, HLD, DM, CKD, Sarcoidosis  Past surgical hx, Medications, Allergies, Family hx, Social hx all reviewed.   Physical Exam:  General - No distress ENT - No sinus tenderness, no oral exudate, no LAN, MP 3, wears dentures Cardiac - s1s2 regular, no murmur Chest - No wheeze/rales/dullness Back - No focal tenderness Abd - Soft, non-tender Ext - No edema Neuro - Normal strength Skin - No rashes Psych - normal mood, and behavior   Assessment/Plan:  Obstructive sleep apnea >> she was non compliant with CPAP before, but wants to restart CPAP again. Plan: - she will need repeat sleep study to start CPAP again  Chronic respiratory failure with hypoxia. Plan: - continue 3 liters supplemental oxygen  Chesley Mires, MD [facility] Pager:  [phone]

## 2014-06-24 NOTE — Patient Instructions (Signed)
Will arrange for sleep study Will call to arrange for follow up after sleep study reviewed 

## 2014-07-03 ENCOUNTER — Telehealth: Payer: Self-pay

## 2014-07-03 NOTE — Telephone Encounter (Signed)
Confirmed with patient that she is currently taking 40 mg Torsemide in the morning. Patient is taking 25 mg hydralazine three times a day. She had OV at [facility] 2 weeks ago.   Medication updated in patient's chart.

## 2014-07-03 NOTE — Telephone Encounter (Signed)
-----   Message from Sueanne Margarita, MD sent at 06/24/2014  3:03 PM EST ----- Please verify dose of torsemide patient is on

## 2014-07-10 ENCOUNTER — Ambulatory Visit (INDEPENDENT_AMBULATORY_CARE_PROVIDER_SITE_OTHER): Payer: Medicare Other | Admitting: Physician Assistant

## 2014-07-10 ENCOUNTER — Encounter: Payer: Self-pay | Admitting: Physician Assistant

## 2014-07-10 VITALS — BP 138/80 | HR 74 | Ht 60.0 in | Wt 234.0 lb

## 2014-07-10 DIAGNOSIS — N184 Chronic kidney disease, stage 4 (severe): Secondary | ICD-10-CM

## 2014-07-10 DIAGNOSIS — I2510 Atherosclerotic heart disease of native coronary artery without angina pectoris: Secondary | ICD-10-CM

## 2014-07-10 DIAGNOSIS — I5032 Chronic diastolic (congestive) heart failure: Secondary | ICD-10-CM

## 2014-07-10 NOTE — Patient Instructions (Signed)
Your physician has recommended you make the following change in your medication:   TAKE A EXTRA TORSEMIDE TODAY AND TOMORROW THEN CONTINUE ON REGULAR DOSE 40 MG ONCE A DAY   Your physician recommends that you schedule a follow-up appointment in:  WITH DR TURNER IN ONE MONTH     Low-Sodium Eating Plan Sodium raises blood pressure and causes water to be held in the body. Getting less sodium from food will help lower your blood pressure, reduce any swelling, and protect your heart, liver, and kidneys. We get sodium by adding salt (sodium chloride) to food. Most of our sodium comes from canned, boxed, and frozen foods. Restaurant foods, fast foods, and pizza are also very high in sodium. Even if you take medicine to lower your blood pressure or to reduce fluid in your body, getting less sodium from your food is important. WHAT IS MY PLAN? Most people should limit their sodium intake to 2,300 mg a day. Your health care provider recommends that you limit your sodium intake to __________ a day.  WHAT DO I NEED TO KNOW ABOUT THIS EATING PLAN? For the low-sodium eating plan, you will follow these general guidelines:  Choose foods with a % Daily Value for sodium of less than 5% (as listed on the food label).   Use salt-free seasonings or herbs instead of table salt or sea salt.   Check with your health care provider or pharmacist before using salt substitutes.   Eat fresh foods.  Eat more vegetables and fruits.  Limit canned vegetables. If you do use them, rinse them well to decrease the sodium.   Limit cheese to 1 oz (28 g) per day.   Eat lower-sodium products, often labeled as "lower sodium" or "no salt added."  Avoid foods that contain monosodium glutamate (MSG). MSG is sometimes added to [place] food and some canned foods.  Check food labels (Nutrition Facts labels) on foods to learn how much sodium is in one serving.  Eat more home-cooked food and less restaurant, buffet, and  fast food.  When eating at a restaurant, ask that your food be prepared with less salt or none, if possible.  HOW DO I READ FOOD LABELS FOR SODIUM INFORMATION? The Nutrition Facts label lists the amount of sodium in one serving of the food. If you eat more than one serving, you must multiply the listed amount of sodium by the number of servings. Food labels may also identify foods as:  Sodium free--Less than 5 mg in a serving.  Very low sodium--35 mg or less in a serving.  Low sodium--140 mg or less in a serving.  Light in sodium--50% less sodium in a serving. For example, if a food that usually has 300 mg of sodium is changed to become light in sodium, it will have 150 mg of sodium.  Reduced sodium--25% less sodium in a serving. For example, if a food that usually has 400 mg of sodium is changed to reduced sodium, it will have 300 mg of sodium. WHAT FOODS CAN I EAT? Grains Low-sodium cereals, including oats, puffed wheat and rice, and shredded wheat cereals. Low-sodium crackers. Unsalted rice and pasta. Lower-sodium bread.  Vegetables Frozen or fresh vegetables. Low-sodium or reduced-sodium canned vegetables. Low-sodium or reduced-sodium tomato sauce and paste. Low-sodium or reduced-sodium tomato and vegetable juices.  Fruits Fresh, frozen, and canned fruit. Fruit juice.  Meat and Other Protein Products Low-sodium canned tuna and salmon. Fresh or frozen meat, poultry, seafood, and fish. Lamb. Unsalted nuts.  Dried beans, peas, and lentils without added salt. Unsalted canned beans. Homemade soups without salt. Eggs.  Dairy Milk. Soy milk. Ricotta cheese. Low-sodium or reduced-sodium cheeses. Yogurt.  Condiments Fresh and dried herbs and spices. Salt-free seasonings. Onion and garlic powders. Low-sodium varieties of mustard and ketchup. Lemon juice.  Fats and Oils Reduced-sodium salad dressings. Unsalted butter.  Other Unsalted popcorn and pretzels.  The items listed above  may not be a complete list of recommended foods or beverages. Contact your dietitian for more options. WHAT FOODS ARE NOT RECOMMENDED? Grains Instant hot cereals. Bread stuffing, pancake, and biscuit mixes. Croutons. Seasoned rice or pasta mixes. Noodle soup cups. Boxed or frozen macaroni and cheese. Self-rising flour. Regular salted crackers. Vegetables Regular canned vegetables. Regular canned tomato sauce and paste. Regular tomato and vegetable juices. Frozen vegetables in sauces. Salted french fries. Olives. Angie Fava. Relishes. Sauerkraut. Salsa. Meat and Other Protein Products Salted, canned, smoked, spiced, or pickled meats, seafood, or fish. Bacon, ham, sausage, hot dogs, corned beef, chipped beef, and packaged luncheon meats. Salt pork. Jerky. Pickled herring. Anchovies, regular canned tuna, and sardines. Salted nuts. Dairy Processed cheese and cheese spreads. Cheese curds. Blue cheese and cottage cheese. Buttermilk.  Condiments Onion and garlic salt, seasoned salt, table salt, and sea salt. Canned and packaged gravies. Worcestershire sauce. Tartar sauce. Barbecue sauce. Teriyaki sauce. Soy sauce, including reduced sodium. Steak sauce. Fish sauce. Oyster sauce. Cocktail sauce. Horseradish. Regular ketchup and mustard. Meat flavorings and tenderizers. Bouillon cubes. Hot sauce. Tabasco sauce. Marinades. Taco seasonings. Relishes. Fats and Oils Regular salad dressings. Salted butter. Margarine. Ghee. Bacon fat.  Other Potato and tortilla chips. Corn chips and puffs. Salted popcorn and pretzels. Canned or dried soups. Pizza. Frozen entrees and pot pies.  The items listed above may not be a complete list of foods and beverages to avoid. Contact your dietitian for more information. Document Released: 01/08/2002 Document Revised: 07/24/2013 Document Reviewed: 05/23/2013 [facility] Patient Information 2015 [place], [place]. This information is not intended to replace advice given to you by  your health care provider. Make sure you discuss any questions you have with your health care provider.

## 2014-07-10 NOTE — Assessment & Plan Note (Signed)
Patient's weight is now up 13 pounds. I've asked her to take an extra Demadex today and tomorrow then go back to 40 mg once daily. Had a long discussion with her concerning 2 g sodium diet. She will try to do better. Follow-up with Dr. Radford Pax in one month.

## 2014-07-10 NOTE — Assessment & Plan Note (Signed)
Patient's creatinine was 3.0. She sees renal next week.

## 2014-07-10 NOTE — Assessment & Plan Note (Signed)
Stable without chest pain 

## 2014-07-10 NOTE — Progress Notes (Signed)
Media Information   File: [id number]      File Link    Scan on 07/02/2014 5:12 PM by Provider Default, MD : OFFICE NOTE [facility] on 07/02/2014 5:12 PM by Provider Default, MD : OFFICE NOTE [facility]    Key Information    Document ID File Type Document Type Description   [id number] Image AMB Correspondence OFFICE NOTE [facility]    Import Information    Attached At Date Time User Dept   Patient Level 07/02/2014 5:12 PM Provider Default, MD

## 2014-07-10 NOTE — Progress Notes (Signed)
HPI: This is a 68 year old female patient Dr. Radford Pax who has chronic diastolic heart failure, COPD on chronic O2 at 3 L, morbid obesity, sarcoidosis, OAS on C Pap, CKD stage IV and hypertension. She was seen by Dr. Radford Pax on 06/21/14 with worsening diastolic heart failure. BUN and creatinine were 64/3.0 and BNP was 324. Patient's medications weren't adjusted.  Patient comes in feeling the same. Her weight is up another 4 pounds for a total of 13 pounds. She is accompanied today by her sister who states that she eats out a lot and he will bring her package foods. She does not watch her salt closely. Patient does admit to this. Patient continues to have dyspnea with little exertion. She denies any chest pain, palpitations, dizziness or presyncope.  Allergies  Allergen Reactions  . Adhesive [Tape] Other (See Comments)    Blisters   . Avelox [Moxifloxacin Hcl In Nacl] Other (See Comments)    GI upset  . Codeine Other (See Comments)    "crazy"   . Guaifenesin Nausea And Vomiting    Takes Mucinex at home without issue  . Latex Swelling  . Oxycodone Nausea And Vomiting    Takes Percocet at home without issue     Current Outpatient Prescriptions  Medication Sig Dispense Refill  . acetaminophen (TYLENOL) 500 MG tablet Take 500 mg by mouth 2 (two) times daily as needed (pain).     Marland Kitchen albuterol (PROVENTIL HFA;VENTOLIN HFA) 108 (90 BASE) MCG/ACT inhaler Inhale 2 puffs into the lungs every 4 (four) hours as needed for wheezing or shortness of breath.     Marland Kitchen albuterol (PROVENTIL) (2.5 MG/3ML) 0.083% nebulizer solution Take 2.5 mg by nebulization every 2 (two) hours as needed for wheezing or shortness of breath.    . Alpha-D-Galactosidase (BEANO PO) Take 1 tablet by mouth daily.    Marland Kitchen Apremilast (OTEZLA) 30 MG TABS Take 30 mg by mouth daily.     Marland Kitchen aspirin EC 81 MG tablet Take 81 mg by mouth daily.    Marland Kitchen atorvastatin (LIPITOR) 20 MG tablet Take 20 mg by mouth at bedtime.    . cloNIDine  (CATAPRES) 0.1 MG tablet Take 0.1 mg by mouth 2 (two) times daily.     Marland Kitchen esomeprazole (NEXIUM) 40 MG capsule Take 40 mg by mouth 2 (two) times daily before a meal.    . ferrous sulfate 325 (65 FE) MG tablet Take 325 mg by mouth daily with breakfast.    . fluticasone (FLONASE) 50 MCG/ACT nasal spray Place 2 sprays into both nostrils at bedtime.    . Fluticasone-Salmeterol (ADVAIR) 250-50 MCG/DOSE AEPB Inhale 1 puff into the lungs 2 (two) times daily.    Marland Kitchen guaiFENesin (MUCINEX) 600 MG 12 hr tablet Take 600 mg by mouth 2 (two) times daily as needed for cough or to loosen phlegm (congestion).     . hydrALAZINE (APRESOLINE) 25 MG tablet Take 1 tablet (25 mg total) by mouth 3 (three) times daily. 90 tablet 3  . hydrOXYzine (ATARAX/VISTARIL) 25 MG tablet Take 25 mg by mouth every 4 (four) hours as needed for itching.    . Insulin Detemir (LEVEMIR) 100 UNIT/ML Pen Inject 40 Units into the skin 2 (two) times daily.    . insulin lispro (HUMALOG KWIKPEN) 100 UNIT/ML SOPN Inject 35 Units into the skin 3 (three) times daily. Take 35 units  (+4-12 units based on sliding scale) every morning, 40 units (+4-12 units) in the afternoon and 40 units (+4-12  units) in the evening.    . isosorbide dinitrate (ISORDIL) 20 MG tablet Take 20 mg by mouth 3 (three) times daily.    Marland Kitchen ketorolac (ACULAR LS) 0.4 % SOLN Place 1 drop into both eyes 3 (three) times daily.     . Linaclotide (LINZESS) 290 MCG CAPS capsule Take 290 mcg by mouth every other day.    Marland Kitchen LORazepam (ATIVAN) 1 MG tablet Take 1 mg by mouth at bedtime as needed for sleep.     . metoprolol tartrate (LOPRESSOR) 25 MG tablet Take 12.5 mg by mouth 2 (two) times daily.    . Multiple Vitamins-Minerals (MULTIVITAMIN WITH MINERALS) tablet Take 1 tablet by mouth daily.    . predniSONE (DELTASONE) 5 MG tablet Take 5 mg by mouth daily with breakfast.    . Probiotic Product (PROBIOTIC PO) Take 1 capsule by mouth daily.    Marland Kitchen torsemide (DEMADEX) 20 MG tablet Take 40 mg by  mouth daily.     Marland Kitchen triamcinolone ointment (KENALOG) 0.1 % Apply 1 application topically 2 (two) times daily as needed (psoriasis).     No current facility-administered medications for this visit.    Past Medical History  Diagnosis Date  . Essential hypertension, benign   . Coronary atherosclerosis of native coronary artery 01/04/2006    Tiny OM1 70-90% ostial stenosis, no other CAD  . COPD (chronic obstructive pulmonary disease)   . Esophageal reflux   . Dyslipidemia   . Gout   . Colon polyps     Tubular adenomatous polyps  . Spinal stenosis of lumbar region   . Bulging lumbar disc   . Chronic diastolic heart failure   . PNA (pneumonia) 2012    With pleural effusion, requiring thoracentesis  . Chronic bronchitis   . On home oxygen therapy   . OSA (obstructive sleep apnea)     Marginally compliant with CPAP  . Type II diabetes mellitus   . Arthritis   . Chronic lower back pain   . Diverticulosis   . GERD (gastroesophageal reflux disease)   . Anxiety   . Hyperlipidemia   . Sarcoidosis of lung   . CKD (chronic kidney disease) stage 4, GFR 15-29 ml/min   . IBS (irritable bowel syndrome)   . Cataracts, both eyes   . XFGHWEXH(371.6)     Past Surgical History  Procedure Laterality Date  . Vesicovaginal fistula closure w/  total abdominal hysterectomy  1992  . Bilateral total knee replacements Bilateral Rt=5/04 & Lft=1/09    by DrAlusio  . 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 Zella Richer  . Appendectomy  1957  . Tonsillectomy  1968  . Cholecystectomy  1980's  . Abdominal hysterectomy  1992  . Reduction mammaplasty Bilateral 1980's  . Cardiac catheterization  1990's  . Thoracentesis  2012  . Joint replacement    . Av fistula placement Left 12/31/2013    Procedure: ARTERIOVENOUS (AV) FISTULA CREATION;  Surgeon: Elam Dutch, MD;  Location: [place];  Service: Vascular;  Laterality: Left;  . Colonoscopy w/ biopsies and polypectomy    . Av fistula placement Left 03/18/2014     Procedure: ARTERIOVENOUS (AV) FISTULA CREATION- LEFT BRACHIOCEPHALIC ;  Surgeon: Elam Dutch, MD;  Location: [facility];  Service: Vascular;  Laterality: Left;  . Ligation of arteriovenous  fistula Left 03/18/2014    Procedure: LIGATION OF ARTERIOVENOUS  FISTULA- LEFT RADIOCEPHALIC;  Surgeon: Elam Dutch, MD;  Location: [place];  Service: Vascular;  Laterality: Left;  Family History  Problem Relation Age of Onset  . Diabetes Mother   . Heart disease Mother   . Hyperlipidemia Mother   . Varicose Veins Mother   . Breast cancer Maternal Aunt   . Heart disease Father   . Deep vein thrombosis Father   . Hyperlipidemia Father   . Heart disease Sister     before age 29  . Cancer Sister   . Diabetes Sister   . Hyperlipidemia Sister   . Heart attack Sister   . Heart disease Brother   . Diabetes Brother   . Hyperlipidemia Brother   . Heart attack Brother   . Hyperlipidemia Sister     History   Social History  . Marital Status: Single    Spouse Name: N/A    Number of Children: 0  . Years of Education: N/A   Occupational History  . Retired   . SECRETARY    Social History Main Topics  . Smoking status: Never Smoker   . Smokeless tobacco: Never Used  . Alcohol Use: No  . Drug Use: No  . Sexual Activity: Not Currently   Other Topics Concern  . Not on file   Social History Narrative   Lives in [place] alone.  Never married.   Retired Art therapist at Center: See history of present illness otherwise negative  Ht 5' (1.524 m)  Wt 234 lb (106.142 kg)  BMI 45.70 kg/m2  PHYSICAL EXAM: Obese, in a wheelchair, on 02, in no acute distress. Neck: No JVD, HJR, Bruit, or thyroid enlargement  Lungs: No tachypnea, clear without wheezing, rales, or rhonchi  Cardiovascular: RRR, PMI not displaced, Normal S1 and S2, no murmurs, gallops, bruit, thrill, or heave.  Abdomen: BS normal. Soft without organomegaly, masses, lesions or tenderness.  Extremities: +1-2  edema bilaterally otherwise without cyanosis, clubbing or edema. Good distal pulses bilateral  SKin: Warm, no lesions or rashes   Musculoskeletal: No deformities  Neuro: no focal signs   Wt Readings from Last 3 Encounters:  07/10/14 234 lb (106.142 kg)  06/24/14 230 lb (104.327 kg)  06/21/14 230 lb (104.327 kg)     EKG: Not performed

## 2014-07-17 ENCOUNTER — Other Ambulatory Visit: Payer: Medicare Other

## 2014-07-17 ENCOUNTER — Ambulatory Visit (INDEPENDENT_AMBULATORY_CARE_PROVIDER_SITE_OTHER): Payer: Medicare Other | Admitting: Pulmonary Disease

## 2014-07-17 ENCOUNTER — Encounter: Payer: Self-pay | Admitting: Pulmonary Disease

## 2014-07-17 VITALS — BP 128/62 | HR 76

## 2014-07-17 DIAGNOSIS — D869 Sarcoidosis, unspecified: Secondary | ICD-10-CM

## 2014-07-17 DIAGNOSIS — J984 Other disorders of lung: Secondary | ICD-10-CM

## 2014-07-17 DIAGNOSIS — G4733 Obstructive sleep apnea (adult) (pediatric): Secondary | ICD-10-CM

## 2014-07-17 DIAGNOSIS — J410 Simple chronic bronchitis: Secondary | ICD-10-CM

## 2014-07-17 DIAGNOSIS — Z8639 Personal history of other endocrine, nutritional and metabolic disease: Secondary | ICD-10-CM | POA: Insufficient documentation

## 2014-07-17 DIAGNOSIS — J9611 Chronic respiratory failure with hypoxia: Secondary | ICD-10-CM | POA: Insufficient documentation

## 2014-07-17 NOTE — Progress Notes (Signed)
Subjective:    Patient ID: [name], female    DOB: 1946-05-15, 68 y.o.   MRN: [id number]  HPI 68 y/o BF here for a follow up visit... he has multiple medical problems as noted below...    ~  April 28, 2012:  37moROV & last visit we decreased the Pred to 521md (on this for hypercalcemia from Sarcoid) & decreased the Lasix to 2071m (due to RI w/ Cr=2.2);  Follow up labs today showed Ca=9.1 and we decided to decr the Pred further to 5mg30md;  In addition her BUN=40, Creat=2.1, BNP=196 (ok to continue Lasix20); she understands that if her Creat starts to rise further that we will have to send her to Nephrology...    She saw DrAltheimer for f/u 9/13> note reviewed & he adjusted her insulin regimen- Levemir 80uQam & Apidra 20-34-30 + SS adjustments...    She fell 8/30 & hit her head> went to ER & DrLockwood's note is reviewed- c/o headache & left knee pain; Exam showed forehead hematoma & CTBrain showed left frontal scalp hematoma, no fx, mild cortical atrophy, mild carotid atherosclerosis, NAD;  Maxillofacial CT was neg w/o facial bone fx;  Left knee prosthesis & no acute changes...    She reports that she just had an ESI injection from DrRamos for LBP & she is improved...    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    She continues her regular f/u appts w/ DrAltheimer, Endocrine> on Levemir, Apidra, Amaryl, Januvia; last A1c=8.1 is Sep2013...    [name], meds, xrays and labs> see below for updates >>   ~  October 25, 2012:  59mo ROV107moost hospital visit> she was Hosp by [facility]> presented w/ weakness, difficulty ambulating, sl confusion w/ memory loss; she had been to DrAltheimer's office & labs showed Ca>14 w/ Creat>4;  Hypercalcemia related to Sarcoidosis- treated w/ IVF, Lasix, Calcitonin, & Solumedrol- ACE level=80;  She was disch on Pred20Bid but we will need to wean this quickly due to her DM;  She had acute renal failure as well w/ Creat>4 and after hydration she was disch w/ Creat ~2;  DM followed & treated by DrAltheimer;  DCSummary reviewed in detail>>    Sarcoidosis w/ HYPERCALCEMIA> Hypercalcemia returned when Pred was cut to 5mg Qod;48mw back on Pred20Bid post hosp & Ca=10.6; we discussed wean to 20mg/d w/43m & recheck Ca level in 3 weeks...     HBP> on Metop25- 1/2, Norvasc10, Apres10Tid, Clonidine0.1Bid, Lasix20, K10-2/d; BP= 160/72 &  she denies CP, palpit, dizzy, ch in SOB/ DOE, edema, etc...    Cardiac Arrhythmia> she has WCT & SBrady- eval by DrAllred for Cards & meds adjusted...    CHOL> on Lip20; last FLP was 4/12- looked good & we reviewed low fat diet restrictions...    DM on Insulin> followed by DrA on [place] coverage at meals; BS at home varies 150-400+, labs done Q59moby DrAltheimer...    Obesity> wt= 203# (63"Tall & BMI=37) and we reviewed diet, exercise, & wt reduction strategies...    GI> GERD on NexiumBid; and we reviewed antireflux regimen (elev HOB, npo after dinner, etc)...    Renal Insuffic> Creat has returned to 2.08 now; advised incr free water intake...    Anxiety> on Alprazolam prn; she's been  under extra stress w/ loss of job, this illness, family issues... [name], meds, xrays and labs> see below for updates >>   ~  November 08, 2012:  2week ROV & she has been stable on Pred213md; still tired, notes DOE w/ walking; BMet today shows K=5.1, BUN=51, Cr=2.1, Ca=9.4; we discussed decr Pred to 1072m, incr free water intake, continue Lasix20; ROV 68mo70mo   Meds reviewed> HBP controlled on her 5med81mChol is ok on Lip20; DM regulated by DrAltheimer on [place] Panoramais unfortunately up to 205# & we reviewed diet/ exercise/ etc; she is coping well w/ her serious medical problems...  ~  Dec 19, 2012:  6wk ROV & recheck after her last visit w/ stable numbers on Pred20mg/40me decided to decr to 10mg/d38mollow up at this time to recheck her Calcium etc; unfortunately she made a mistake & has continued the Pred at the 20mg/d 75m since her last visit... Feeling OK overall, her wt is up 3# to 207# today, BP= 138/72, and she recently had f/u DrAltheimer to recheck her DM (insulin adjusted)...  We reviewed her medication record & her procedures and instructed her to decr the Pred to 10mg/d a65mis point... We wil  Recheck pt in 6-8weeks w/ BMet at that time...  ~  February 19, 2013:  594mo ROV &75mon last seen Inger cut her Pred to 10mg/d> sh78mes DrAltheimer frequently w/ extensive lab work done at each visit; last labs avail to us 6/14 shoKoread Calcium=9.6, BUN=36, Cr=1.86, A1c=8.5; We decided to continue Pred taper & she is instructed to decr the Pred to 05-06-09-5 Qod w/ ROV planned 94mo...  She27mo Home O2 for her Sarcoid, chronic hypoxemic resp failure, DiastolicCHF> we did Ambulatory O2 check today>  O2sats on RA:  Rest= 94% w/ pulse72;  After 3laps= 82% w/ pulse=90...     OSA on CPAP but only using 1-2d/wk    BP= 122/80 on her cardiac meds> Metop, Amlod, Hydral, Clonid, Lasix40; she denies CP, palpit, dizzy/ syncope, SOB, edema...    DM treated by DrA w/ Levemir 78uAM-0uPM, Apidra  18-30-26, Glimep4, Januv50; he [id number] to check her every month w/ extensive labs & his notes reviewed...    Despite diet etc her weight is up to 214# & we reviewed wt reduction strategy...    Renal insuffic w/ Cr ~2 chronically [name], meds, xrays and labs> see below for updates >>   ~  April 16, 2013:  594mo ROV & po86moosp check> Kezia was Adm by [facility] 9/1 - 04/10/13 w/ acute on chronic diastolic CHF when she presented w/ increased SOB & edema; BNP was 2600, CXR  showed vasc congestion, 2DEcho w/ EF=60-65% & Gr2DD, Creat= 2.5 but increased to 4.1 w/ diuresis; that forced them to replace some fluids, she was disch on Demadex40...   Since disch she has been back to see DrAltheimer 9/12 w/ labs showing BS=255, A1c=8.1, BUN=73, Cr=2.94 and she has an upcoming appts w/ Nephrology & the CHF clinic soon...  Of note the Hospitalist team held her Pred during the hosp & didn't restart after disch- recall hx Sarcoidosis w/ primarily severe hypercalcemia as manifestation, prev splenectomy w/ transient improvement then recurrent hypercalcemia & Rx w/ Pred w/ nice response but calcium bumped up to >14 when Pred weaned to 1m Qod in early 2014; we have slowly diminished her dose back to 10-5 Qod when last seen & review of labs showed Calcium levels all wnl in hosp and measured 10.1 in DrAltheimer's office 9/11... We discussed options here & we decided to RESTART PRED 10-5 QOD for the next few weeks (she is feeling weak since off the Pred) then taper to 564md til return o\in [id number] we will check BMet & ACE level at that time...  ~  June 19, 2013:  43m64moV & Nihira continues to struggle w/ her multisystem disease>> from the Pulm standpoint her breathing is sl better & she is using Pred5mg31m Advair250Bid, ProairHFA as needed; her calcium level remains wnl as we have weaned the Pred (Ca=9.5 on labs yest); we discussed coming down 1/2 step to 5mg 14mernate w/ 2.5mg Q40m& we will recheck her in  43mo...48mo CARDS>>  On Metoprolol25-1/2Bid, Bidil20-37.5Tid, Demadex20-2/d;  She had f/u w/ DrMcLean 10/14> 29/93>hr diastolicCHF; sl improved since hosp 9/14 where Echo showed EF60-65%, Gr2DD, mildAS, RV normal; now on Demadex20-2Qam but labs yest showed BUN=113, Cr=4.2 (weight was up recently & they increased her to 40mgBid14m several days, now asked to hold Demadex for 2d & resume 40mg Qam61mer that w/ f/u labs); she needs f/u w/ Nephrology- DrSanford...     DM>>  Endocrine is managed by DrAltheimer on Levemir 50uBid and Apidra=> switched to Humalog (taking 18-30-26) but both are too expensive in the donut hole & she cannot afford her meds; asked to discuss w/ drAlt the poss of switch to NPH or poss 70/30 insulin to save money etc...     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     RENAL>>  She saw DrSanford 9/14> I have reviewed his perspicacious note & discussed w/ him; we have been able to wean her Pred down to 5mg/d and9mr hypercalcemia is remaining under control (there is no data on treatment for this manifestation w/ alternative meds); her CKD is most likely due to her DM & HBP, also has MGUS & the sarcoid (only clinical manifestation has been the hypercalcemia & lymphoreticular granulomatosis); NOTE: Cr prev 2 range, then 2-3 range & now 3-4 range since Cards increased diuretics for her diastolicCHF (hopefully Cr will improve somewhat as she's been on incr diuretics, water restriction, & had the gastroenteritis)...     PSORIASIS>> she sees DrTafeen for Derm; asking for refill Clobetasol cream & Atarax25mg prn i55mng [name], meds, xrays and labs> see below for updates >>   LABS 11/14 showed BS=279, BUN=113, Cr=4.2... Cards haMarland KitchenMarland Kitchenresponded w/ transient decr in diuretic dose, she needs  f/u w/  Renal...  ADDENDUM>>  Full PFTs done 08/08/13 prior to getting her enrolled into PulmRehab program>>  FVC=1.50 (65%), FEV1=1.23 (69%), %1sec=82, no improvement in FEV1 post bronchdilator, Lung volumes are mild to mod restricted, DLCO is reduced but normalizes after correction for VA...  ~  August 20, 2012:  22moROV & Leola tells me she had a URI over Christmas that lasted 3 wks, she kept the Pred at 580md stating that she felt worse when she cut it to 5-2.5 Qod ("I felt weak")...     She is awaiting a call from [place] get enrolled...    She needs a substitute for Bidil (20-37.5Tid)- too $$- and we discussed dividing it up into Isordil2070md and Hydralazine 67m28m...    She continues to see DrAltheimer for Endocrine, DM & his extensive notes are reviewed; A1c improved into the 8's...     She saw DrPerry w/ constipation- treated w/ Linzess but too $$, given samples and she will f/u w/ him; she also notes that Nexium definitely works better for her...    She is seeing DrSanford for Nephrology & he is favoring peritoneal dialysis but she isn't so sure; has f/u soon to recheck her renal function soon...    Her prev rash is resolved off Allopurinol... [name], meds, xrays and labs> see below for updates >> she had the 2014 Flu vaccine in Nov...  ~  November 19, 2013:  100mo 70mo& Thersa has been enrolled in PulmRFloridaually tells me insurance wouldn't cover PulmREdgemoore is paying for the CardsRehab maintenance program out of pocket... She continues regular follow up visits w/ DrSanford for Nephrology & DrAltheimer for Endocrine (they do labs with each visit which we review)... She had a gastroenteritis 3/15 w/ trip to the ER> Labs noted below- treated w/ IVF & Zofran, Immodium & cleared back to baseline...     OSA>> she indicates still not resting well, intol to CPAP & she agrees to f/[place]/ [place] med, [place]  PULM>> Hx Sarcoid w/ Hypercalcemia and restrictive lung dis by  PFTs 1/15> her breathing is stable on O2 at 2L/min, Pred5mg/d54mdvair250Bid, ProairHFA as needed; her calcium level remains wnl as we have weaned the Pred down to 5mg/d 11m we've been reluctant to cut further due to her prev hx w/ hyperclcemia flairs when Pred dropped lower than this- last Ca level was 10.3 in the ER 3/15 (gastroenteritis) & CXR was clear; we decided to continue the Pred5mg/d..41m  CARDS>>  On ASA81, Metoprolol25-1/2Bid, Isordil20Tid, Apres25Tid, Demadex20-2/d;  BP= 118/74 & she denies CP but has DOE w/ activity; saw DrMcLean 10/14> C95/63>r diastolicCHF; sl improved since hosp 9/14 where Echo showed EF60-65%, Gr2DD, mildAS, RV normal; on Demadex20-2Qam & renal is stable...    DM>>  Endocrine is managed by DrAltheimer on Levemir 56uBid and Humalog (taking 24-34-30); Labs 1/15 showed BS=165, A1c=8.4...    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     RENAL>>  She saw DrSanford 9/14 & 1/15> Stage4 CKD & nephrotic range proteinuria; I have reviewed his perspicacious note & discussed w/ him; we have been able to wean her Pred down to 5mg/d an55mer hypercalcemia is remaining under control (there is no data on treatment for this manifestation w/  alternative meds); her CKD is most likely due to her DM & HBP, also has MGUS & the sarcoid (only clinical manifestation has been the hypercalcemia & lymphoreticular granulomatosis); NOTE: Cr prev 2 range, then 2-3 range & now 3-4 range since Cards increased diuretics for her diastolicCHF; they discussed dialysis & she is opting for peritoneal dialysis when this is needed, they also discussed poss AVfistula for vasc access if needed later...    PSORIASIS>> she sees DrTafeen for Derm & has been started on some new medication... [name], meds, xrays and labs>  see below for updates >>   CXR 3/15 showed norm heart size, clear lungs, NAD...  LABS 3/15:  Chems- K=3.4, BUN=61, Cr=2.9, Ca=10.3, BS=211;  CBC- ok w/ Hg=14.8, WBC=14.8.Marland KitchenMarland Kitchen  ~  February 18, 2014:  39moROV & Nazirah tells me that [facility] She tells me that she recently treated Kailah for "a COPD exacerbation" w/ Depo & Pred bump=> pt states improved & back down to her usual Pred534md for her Sarcoid & hx hypercalcemia which remains well controlled, unfortunately she has gained 10+lbs and working hard to get this back down ... In the interim she has had f/u w/ DrSanford for Nephrology=> sent to DrFields vasc surg for access w/ left arm fistula attempted... She continues to f/u w/ DrAltheimer every 2-86m386mo adjust in DM meds, labs etc...     OSA> she had Sleep Med f/u DrSood, new machine & mask interface; she reports MUCH improved, resting better, feeling better, etc...    Hx sarcoid w/ hypercalcemia, and restrictive lung dis from this and obesity> on O2- 2L/min w/ activity, Advair250Bid, Pred5mg68m MMW, Flonase, Proair; she is c/o cough, esp at night,  & some reflux symptoms; she has known GERD followed by DrPerry, on Nexium40Bid & antireflux regimen; we reviewed the need for vigorous antireflux regimen- Nexium, NPO after dinner, elev HOB (she has craftmatic bed), and add REGLAN10mg6m also wrote for Tussionex 1tsp Q12h for prn use...  [name], meds, xrays and labs> see below for updates >> NOTE: she is on Apremilast per DrTafeen for Psoriasis...  LABS 7/15 by DrAltheimer> Chems- BUN=56, Cr=2.8, Ca=9.4, BS=94, A1c=8.5... REMarland KitchenMarland Kitchento continue O2 w/ exercise (she is in PulmRehab maintenance (but too $$ she says), Advair, Pred, etc... Establish vigorous antireflux regimen as above + Metaclopramide 10mg 10m.. ROV recheck in 3-40mo.  60moecember 16, 2015:  36mo ROV486moornelia has had numerous medical interventions since our last  visit> several ER visits, Admission 10/15 by [facility] (incr edema w/ acute on chronic hypoxemic resp fail, mult f/u visits w/ Cards (DrTTurner & CHF clinic w/ diastolicCHF), f/u DrFields w/ new fistula in left arm, and f/u w/ DrSanford Renal team, and she sees Dr. Cammie FAntony Blackbirdmary care...     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    She has OSA & supposed to be on CPAP but she has  been non-compliant & DME "cut me off" for this reason; she saw [place] 11/15 & he has ordered another Sleep Study but it couldn't be sched until Jan2016, she is hoping to get back on the CPAP; she remains on O2 at 3L/min by nasal cannula...     She has chronic hypoxemic resp failure w/ diastolic dysfunction, and both obstructive & restrictive lung dis components; she remains on Pred5/d, Advair250Bid, NEBs w/ Albut "prn", Mucinex & Flonase as needed... She desperately needs to lose wt but we made no changes in her meds today; she indicates that [facility] (unable to afford pulm rehab)    Cards> she saw DrTurner & MLenze recently- given extra Demadex for 2d but BUN/Cr= 64/3.0 and BNP measured 324... Meds include: ASA, Lopressor, Apres, Clonidine, Isordil, Demadex...    Renal> she had fistula attempt by DrFields and f/u Nephrology DrSanford 11/15, their notes are reviewed...    Endocrine> she continues to follow w/ drAltheimer regularly for her DM, Chol, etc... Meds include Levemir, Humalog SSI, Liptor...     Derm> note that she has been started on  OTEZLA per DrTafeen for her Psoriasis... [name], meds, xrays and labs> see below for updates >>   LABS 12/15:  ACE= 42...          Problem List:    OBSTRUCTIVE SLEEP APNEA - Sleep Study 11/07 showed RDI=21 w/ desat to 62% during REM.Marland Kitchen. eval by [place] w/ Rx for CPAP 12... compliance poor- only using it 1-2 times/wk... encouraged to use CPAP more regularly & to f/u w/ DrSood regarding the mask interface problems... ~  8/11:  She reports new mask but still not using CPAP regularly, "I rest fairly well". ~  7/12:  She reports not using her CPAP, and not resting well... ~  1/13:  CPAP used briefly during [facility] for diastolic CHF, but still not compliant w/ home use... ~  7/14:  OSA on CPAP but only using 1-2d/wk... we did Ambulatory O2 check today>  O2sats on RA:  Rest= 94% w/ pulse72;  After 3laps= 82% w/ pulse=90...  ~  11/14: encouraged to use the CPAP more regularly... ~  4/15: she indicates that she cannot use the CPAP, intol to mask interface, and we have rec f/u sleep Med consult w/ DrSood to explore her options... ~  11/15: she had f/u w/ DrSood> he has ordered another sleep study & this is sched for 1/16...  BRONCHITIS, ACUTE - on ADVAIR 250Bid (using Prn only) & PROAIR as needed. Combined Obstructive & Restrictive LUNG DISEASE >>  SARCOIDOSIS - extensive gran inflamm in spleen, +hypercalcemia, no obvious lung involvement. ~  CXR 1/09 was pre-op for TKR- sl cardomeg & ?mild vasc congestion. ~  CXR 2/11 showed norm heart size & vascularity, clear, s/p cholecystectomy, DJD in TSpine. ~  CXR & CT Chest 11/11 showed sl peribronch thickening, no lung lesions, no signif adenopathy. ~  PET scan 1/12 showed hypermetabolic activ in spleen & lymph nodes in the supraclav, hilar, mediastinal, periaotic & iliac region as well. ~  CXRs 3/12 in the perioperative period after splenectomy showed left effusion==> tapped & improved aeration. ~  CXR 4/12 back to baseline w/ clear bases, essent  wnl... ~  CXR 7/12 showed norm heart size, clear lungs, no definite adenopathy, NAD... ~  LABS 7/12 w/ ACE=78 (8-52), Ca=12.0, AlkPhos=176 (39-117), GGT=151 (7-51) ~  Labs from DrAltheimer> 8/12: Ca=12.4, Creat=1.8; then 9/12: Ca=13.4,  Creat=2.3;  ~  10/12:  PREDNISONE 129m/d started 10/12 & 3wks later Ca=10.3, Creat=1.8; therefore weaned to 20-10 Qod. ~  11/12: Labs on Pred 20-10 Qod showed Ca++= 9.8; we will continue to wean Pred. ~  1/13:  CXR showed cardiomeg, mild interstitial edema, DJD spine; Labs showed Ca=9.7 & we decided to wean Pred to 542md... ~  3/13:  Labs showed Ca=10.1 on Pred 29m79m; decided to wean to 29mg23m... ~  6/13:  Labs showed Ca=11.0, ACE=86; on Pred5Qod & rec to incr back to 10mg46m. ~  8/13:  CXR showed cardiomeg, ?mild interstitial edema, mild DJD TSpine... ~  Labs 8/13 showed Ca=10.0 on Pred10/d; rec to decr further to 29mg/d70m ~  Labs 9/13 showed Ca= 9.1 on Pred29mg/d 28mhe will try to decr to 5Qod again... ~  11/13: she recently went to ER w/ incr SOB & was placed on Pred40/d; we are adding NEBS w/ ALBUT2.29mgQid 20mean Pred back to 29mgQod a20mefore. ~  3/14: she was hosp w/ Ca>14 (on the Pred29mgQod); 17mated w/ IVF, Lasix, Calcitonin & incr steroids; Disch on Pred20Bid; we will wean Pred to 20mg/d & f66mn 3wk. ~  4/14:  On Pred20/d & calcium improved to 9.4; we will decr to Pred10mg/d & f/34mo... ~  7/74mo On Pred10mg/d & calc41mremains well controlled at 9.6 and we decided to decr Pred to 05-06-09-5 Qod... ~  9/14:  Hosp for acute on chr diastolic CHF & [facility] stopped her Pred; Calcium levels in hosp were 9.6-10.1 and recent labs by DrAltheimer showed Ca=10.1; REC- restart Pred 10-5 QOD & try to wean to 29mg/d w/ f/u l29m in 42mo... ~  CXR 9529moshowed mild cardiomeg (stable), mild vasc congestion, sl bronch wall thickening & mild elev right hemidiaph (no changes)... ~  11/14: Labs shows Ca=9.5 on Pred29mg daily; ACE=461mRec to decr further to 29mg alt w/ 2.29mg 51m=> she  was41mable to do this & kept the Pred at 29mg/d... ~  1/15: P29m done prior to enrolling into PulmRehab program>> FVC=1.50 (65%), FEV1=1.23 (69%), %1sec=82, no improvement in FEV1 post bronchdilator, Lung volumes are mild to mod restricted, DLCO is reduced but normalizes after correction for VA... ~  4/15: Hx Sarcoid w/ Hypercalcemia and restrictive lung dis by PFTs 1/15> her breathing is stable on O2 at 2L/min, Pred29mg/d, Advair250Bid,73moairHFA as needed; her calcium level remains wnl as we have weaned the Pred down to 29mg/d but we've been 73muctant to cut further due to her prev hx w/ hyperclcemia flairs when Pred dropped lower than this- last Ca level was 10.3 in the ER 3/15 (gastroenteritis) & CXR was clear; we decided to continue the Pred29mg/d... ~  7/15:  Lab81mrom DrAltheimer showed Ca= 9.4 on her Pred 29mg/d, continue same; c46mcough, nocturnal exac & reflux symptoms- rec to optimize antireflux regimen (NexiumBid, NPO after dinner, elev HOB) and we will add Reglan10mgQhs. ~  12/15:  She 29mhad considerable trouble w/ DiastolicCHF, Renal Insuffic, & assoc chronic hypoxemic resp failure; Sarcoid is quiescent w/ normal Calcium levels, no adenopathy, no acute CXR changes, ACE=42; she does not want to wean Pred further therefore continue 29mg/d...   SHE HAS ESTABL29mED PRIMARY CARE w/ [facility] Dr. Cammie Fulp>>   HYPERTENSIAnder Gasteron numerous meds w/ med changes over time >> she is asked to bring all med bottles to the visits but never does! ~  1/13: Post Hosp BP= 146/70 on ?4[place]op12.5, Amlod10,129monid0.1Bid, Lasix20; she denies CP, palpit, SOB,  edema> improved from recent hosp... ~  3/13: She called in the interim for "refill" Apresoline not prev on any med list; BP= 150/80 on all 5 meds; must elim sodium & get wt down... ~  6/13:  BP= 142/80 on 662mds but BUN-41 Creat=2.3 BNP=104; rec to stop Lasix for now, f/u BMet 6wks. ~  8/13:  BP= 140/70 on same meds & BUN=30, Creat=1.6, but BNP incr to 2960 &  LASIX 4162md restarted... ~  9/13:  Follow up labs showed BUN=45, Creat=2.2 on Lasix40 and we rec decr to Lasix20... ~  9/13:  BP= 122/74 on 62m53m w/ BUN=40, Creat=2.1, & BNP=196... Continue same. ~  11/13:  BP= 142/68 on same 62me42m.. ~  3/14:  BP= 160/72 on same 5 meds for now... ~  4/14:  BP= 128/70 on her 62med77mgimen...  ~  7/14:  BP= 122/80 on her 5 cardiac meds> Metop, Amlod, Hydral, Clonid, Lasix40; she denies CP, palpit, dizzy/ syncope, SOB, edema... ~  9/14:  She was hosp w/ acute on chronic diastolic CHF & meds changed; disch on Metoprolol25-1/2, Bidil 20-37.5Tid, Demadex20-2/d; BP= 128/70 but recent labs by DrA showed BUN=73, Cr=2.94 & she has f/u w/ Neph & CHF clinic... ~  11/14: on same meds & BP= 138/64...  1/15 BP+ 134/60... ~  4/15: on ASA81, Metoprolol25-1/2Bid, Isordil20Tid, Apres25Tid, Demadex20-2/d;  BP= 118/74 & she denies CP but has DOE w/ activity... ~  Followed by Cards- DrTTurner etal Harriett SinePrimary Care- DrCammieFulp...  CAD, CHRONIC DIASTOLIC CHF,  & CARDIAC ARRHYTHMIA >> on ASA 81mg/11m  ~  Hx non-obstructive CAD w/ cath 6/07 by DrStuckey showing luminal irregularities & tiny first marginal branch of the CIRC w/ ostial lesion... good LVF. ~  2DEcho 9/07 showed mildly calcif AoV and normal LVF & wall motion. ~  2DEcho 11/11 showed norm LV wall thickness, norm LVF w/ EF= 60-65%, norm atria, norm valves, trivial peric fluid behind heart. ~  2DEcho 1/13 showed mild LVH, norm LVF w/ EF=55-60% & no regional wall motion abn, Gr2DD, norm RV... ~  3/14: In hosp for hypercalcemia etc> she had cardiac arrhythmia w/ WCT & SBrady- to be f/u DrAllred... ~  5/14: she had f/u DrAllred> hosp f/u for WCT (atrial tachy per EP), pt asymptomatic- denies CP, palpit, SOB, dizzy/ syncope, edema, etc; no further w/u planned, f/u prn... ~  7/14: she had f/u DrWall> chr diastolicCHF, nonobstructiveCAD, mildMR- stable, no changes made, f/u 5yr...73yr9/14:  She was adm by [facility] w/ acute on chr  diastolicCHF (BNP=260BOF=7510/ vasc congestion, 2DEcho w/ EF60-65% & Gr2DD); meds adjusted & diuresed but Creat incr to 4.1; eventually disch on Demadex40 & Cr= 3.3=>2.9; she has appt in CHF clinic... ~  11/14: she has been actively followed by CHF clinic & DrMcLean w/ med adjustments... ~  Followed by Cards- DrTTurner  CEREBROVASCULAR DISEASE - she remains on ASA 81mg/d 42mout TIA's or other neuro manifestations...  ~  MRA Br 9/07 showed mod intracranial atherosclerotic changes...   HYPERLIPIDEMIA - on LIPITOR 20mg/d..67mP monitored at each OV by DrAMillicanheimer... ~  FLP 7/07 [place] T[place]ol 114, TG 85, HDL 28, LDL 69 ~  FLP 4/09 showed TChol 154, TG 192, HDL 34, LDL 82... discussed poss of adding fibrate- hold... ~  FLP 2/10 on Lip20 showed TChol 159, TG 207, HDL 29, LDL 83... rec> add Fenoglide (she didn't). ~  FLP 2/11 [place] showed TChol 185, TG 242, HDL 43, LDL 99... add FENOFIBRATE 160mg/d...30mFLP  8/11 on Lip20+Feno160 showed TChol 167, TG 216, HDL 37, LDL 96... she stopped Feno160. ~  [place] 4/12 on Lip20 showed TChol 146, TG 115, HDL 35, LDL 88 ~  FLP 2/13 on Lip20 showed TChol 159, TG 176, HDL 41, LDL 86 ~  Followed by DrAltheimer & labs checked by him...  DM - on LEVEMIR & APIDRA SS coverage now per DrAltheimer; plus prev on GLIMEPIRIDE 744m/d and JANUVIA 5544md. ~  labs 4/08 showed BS=138 & HgA1c=7.5...Marland Kitchenhome BS = 100 to >300... misses doses and not on diet or exercising... ~  1/09 became hypoglycemic in hosp on 4 meds post knee surg- meds adjusted... ~  3/09 restarted GLIMEPIRIDE 44m16m1/2 tab each AM, & continue Lantus & Metformin... ~  labs 4/09 showed BS= 148, HgA1c= 7.3... Marland Kitchenec> back on 4 med regimen due to poor control at home. ~  6/09 discussed titrating the Lantus up until FBS 100-120 range... ~  labs 2/10 showed BS= 164, HgA1c= 8.6... Marland KitchenMarland Kitchenry disappointing- needs home BS monitoring, incr Lantus. ~  labs 2/11 showed BS= 298, A1c= 8.4...  discussed change Lantus to LEVEMIR 40 u  daily... ~  labs 8/11 showed BS= 202, A1c= 10.9... Marland Kitchenec> incr Levemir to 50, consider Humalog. ~  Nov-Dec 2011:  BS up w/ adjust of meds & addition of Pred after hosp 11/11 & hypercalcemia... ~  She states she returned to Levemir 40u daily after the splenectomy hosp due to appetite etc;  4/12 BS=117 ~  Labs 7/12 showed BS= 167, A1c= 8.4> on Levemir 50u/d & Amaryl 44mg31mm (note creat 1.9). ~  8-10/12: on Levemir30 + ApidraSS per DrA & BS are 150-400+ due to the Pred20; A1c 8/12 was 7.6 ~  11/12:  DrA has increased her Levemir to 46u/d & the Apidra to 10+SS cover Tid... BS here= 168 ~  1/13: [facility] A[facility]c=9.3 (Creat up to 2.4 but improved to 1.7 w/ adjust meds)... ~  DrAltheimer's notes indicates continued adjustments in her insulin regimen w/ Levemir & Apidra... ~  9/14:  He prev oral meds were stopped & currently taking Levemir40Bid & Apidra incr to 24-34-30 by DrA; recent BS=255, A1c=8.1 ~  11/14:  meds adjusted, on Levemir 50uBid and Apidra=> switched to Humalog (taking 18-30-26) but both are too expensive in the donut hole & she cannot afford her meds; asked to discuss options w/ DrAltheimer. ~  1/15: she had f/u DrAltheimer> his note is reviewed, BS=165, A1c=8.4 ~  2/15: note from Ophthalmology- DrRankin> +DM retinopathy & macular edema... ~  Followed by DrAltheimer and labs checked by him...  OBESITY (ICD-278.00) - weight down to 171 after hosp 11/11- doing better on diet, not yet exercising. ~  weight 220-230# in the early 1990's... ~  weight 210-220# in the early 2000's... ~  weight 2/10 = 192# ~  weight 2/11 = 186# ~  weight 8/11 = 174# ~  weight 12/11 = 171# ~  Weight 4/12 = 168# ~  Weight 7/12 = 171# ~  Weight 11/12 = 188# ~  Weight 1/13 = 196# ~  Weight 3/13 = 205#... She MUST get back on diet, incr exercise, get wt down. ~  Weight 6/13 = 205# ~  Weight 8/13 = 210# ~  Weight 9/13 = 220#... What happened? ~  Weight 11/13 = 225# and BMI= 40 ~  Weight 3/14 =  203# ~  Weight 7/14 = 214# ~  Weight 9/14 = 216# ~  Weight 11/14 = 224# and  we reviewed diet, exercise & wt reduction strategies... ~  Weight 4/15= 208#  HYPERCALCEMIA> this was most likely due to sarcoidosis w/ extensive splenic involvement;  Calcium was as hight as 13.7 and returned to normal after the splenectomy. ~  Labs 4/12 showed calcium = 11.1 & this is very disappointing... ~  Labs 7/12 showed Ca= 12.0, Phos= 4.0, ACE= 78 ~  Labs 8-10/12 showed Ca up to 13.4, then down to 10.3 on PRED57m/d... ~  Labs 11/12 showed Ca= 9.8 on Pred 20-10 qod; we will continue to wean. ~  [place]= 9.7 range on Pred 10-5 Qod;  We decided to wean Pred to 579md... ~  Labs 3/13 showed Ca=10.1 on Pred5m46m; rec to decr to 5mg48m... ~  Labs 6/13 showed Ca=11.0 on Pred5Qod; rec incr to 10mg35m. ~  Labs 8/13 showed Ca=10.0 on Pred10/d; rec to decr to 5mg/d54m ~  Labs 9/13 showed Ca=9.1 on Pred5mg/d;72mc to decr to 5mgQod.58m~  Labs 12/13 showed Ca= 9.2 to 10.5 on Pred 5mgQod..36m  Labs 3/14 in hosp showed Ca= 14.5 & disch on Pred20Bid; 3/14 f/u Ca=10.6 & Pred decr to 20mg/d...54m4/14: on Pred20/d and f/u labs showed Ca= 9.4... Rec to decr Pred to 10mg/d & f32mmo... ~  7731mo on Pred10/d and most recent Ca= 9.6... rec to deMarland KitchenMarland Kitchen Pred to 05-06-09-5 Qod... ~  9/14: Pred was stopped by [facility] duringh 9/14 hosp & serial calcium levels reviewed- 9.6=>10.1; we decided to restart her Pred10-5 QOD for now & try to wean to 5mg/d by f/u19m 31mo w/ repeat28mos... ~  11/14: on Pred5mg/d and Ca=922m rec to decr to 5mg alternate w75m.5mg QOD... ~  3/53m labs from ER showed Ca= 10.3 on Pred5mg/d...  ~  12/160mlabs over the last 58mo showed Ca= 8.837mo  GERD - on NEXIUM 40mg Bid... w/ incr88md reflux symptoms w/ noct cough etc...  ~  2/10: discussed optimal Rx w/ Nex before dinner, Zantac300 + Reglan10 at bed, elev HOB, etc. ~  2/11: she stopped the Reglan, still using Nexium/ Zantac but w/ persist symptoms>   refer to GI for eval. ~  6/11: GI f/u DrPerry w/ EGD that was normal... continue Rx. ~  12/11:  increased GI symptoms post hosp> she will f/u w/ DrPerry for his input> incr Nexium Bid. 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 ~  6/11:  f/u colonoscopy by DrPerry showed divertics, 4 polyps, AVM... path= tubular adenMarland KitchenMarland Kitchena, f/u 38yrs. ~  9/14: she de4yrbes abd cramps, sick feeling when she has to go- better after BM, takes Senakot-S and Bentyl10Qid prn...  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 ~  Note: Serum Calcium ret to normal immediately after spleen removed, but hypercalcemia returned after several months forcing us to start Rx w/ PredK[place].  RENAL INSUFFICIENCY (ICD-588.9) >> Creat ~ 2.0 & eval 10/11 by DrFox- prob due to DM & HBP... ~  Labs 4/12 showed BUN= 27, Creat= 1.6 ~  Labs 7/12 showed BUN= 44, Creat= 1.9 (not on diuretics/ NSAIDs/ etc)... ~  Labs showed Creat up to 2.3 when Ca=13.4; now improved to 1.8 w/ Ca coming down... ~  Labs 11/12 showed BUN=38, Creat=1.7; rec to maintain hydration... ~  Labs  1/13 in [place] w/ diuresis showed Creat incr to 2.4 but ret to 1.7 w/ adjustment in meds... ~  Labs 3/13 showed BUN=38, Creat= 1.6 ~  Labs 6/13 showed BUN=41, Creat= 2.3; rec> stop Lasix20 for now, liberalize fluids. ~  Labs 9/13 showed BUN=40, Creat=2.1 on Lasix20, rec to continue the same. ~  Labs 12/13 showed Creat= 1.5 ~  Labs 3/14 in hosp showed Creat=4, then decr to 2.00 by dischage... ~  Labs 4/14 showed BUN=51, Creat=2.1, and rec to incr free water as we wean the Pred... ~  Labs 6/14 showed BUN= 36, Creat= 1.86 ~  9/14: she was hosp by [facility] w/ Ac on Chr diastolicCHF & diuresed w/  Creat 2.3=>4.1=>3.3 in hosp; last Cr=2.9 on 9/11 by DrAltheimer & she has appt w/ Nephrology soon... ~  11/14: Labs are worse w/ BUN=113, Creat=4.2 after recent gastroent 7 diuretics increased by Cards for wt gain; they have adjusted down & she needs Renal follow up... ~  Labs 3/15 ER showed BUN=61, Cr=2.9; followed by DrSanford & DrAltheimer...  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 on LYRICA 19m Qhs, off prev Tramadol Rx... ~  [place] 551m Ly[place] 7534md...  GOUT, UNSPECIFIED (ICD-274.9) - Uric in the 7-8 range...on ALLOPURINOL 300m14mfor prevention...  ANXIETY (ICD-300.00) - she is under mod stress- work, mother died, hospice counselling, etc... ~  8/11:  rec starting Alprazolam 0.5mg 31m for palpit, SOB, anxiety...  ANEMIA-NOS & MGUS >> see eval 10-11/11 by DrGranfortuna w/ bone marrow 11/11 in hosp= neg. ~  Labs 4/12 showed Hg= 11.6, MCV= 84... On FeSO4 daily. ~  Labs 7/12 showed Hg= 12.8, MCV= 90, Fe= 45 (14%sat)... ~  Labs 1/13 in [facility] Hg=10.4 but improved to 12.9 on post hosp check in office... ~  Labs 6/13 showed Hg= 12.4 ~  Labs 12/13 showed Hg= 13.9 ~  Labs 3/14 showed Hg= 12.9 ~  Labs 9/14 during hosp showed Hg= 10-11 range... ~  Labs 3/15 showed Hg= 14.8  PSORIASIS - eval and Rx per DrTafeen prev on Humira injections- stopped 9/11...   Past Surgical History  Procedure Laterality Date  . Vesicovaginal fistula closure w/  total abdominal hysterectomy  1992  . Bilateral total knee replacements Bilateral Rt=5/04 & Lft=1/09    by DrAlusio  . 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 RosenZella Richerppendectomy  1957  . Tonsillectomy  1968  . Cholecystectomy  1980's  . Abdominal hysterectomy  1992  . Reduction mammaplasty Bilateral 1980's  . Cardiac catheterization  1990's  . Thoracentesis  2012  . Joint replacement    . Av fistula placement Left 12/31/2013     Procedure: ARTERIOVENOUS (AV) FISTULA CREATION;  Surgeon: CharlElam Dutch  [facility];  Laterality: Left;  . Colonoscopy w/ biopsies and polypectomy    . Av fistula placement Left 03/18/2014    Procedure: ARTERIOVENOUS (AV) FISTULA CREATION- LEFT BRACHIOCEPHALIC ;  Surgeon: CharlElam Dutch  [facility];  Laterality: Left;  . Ligation of arteriovenous  fistula Left 03/18/2014    Procedure: LIGATION OF ARTERIOVENOUS  FISTULA- LEFT RADIOCEPHALIC;  Surgeon: CharlElam Dutch  [facility];  Laterality: Left;    Outpatient Encounter Prescriptions as of 07/17/2014  Medication Sig  . acetaminophen (TYLENOL) 500 MG tablet Take 500 mg by mouth  2 (two) times daily as needed (pain).   Marland Kitchen albuterol (PROVENTIL HFA;VENTOLIN HFA) 108 (90 BASE) MCG/ACT inhaler Inhale 2 puffs into the lungs every 4 (four) hours as needed for wheezing or shortness of breath.   Marland Kitchen albuterol (PROVENTIL) (2.5 MG/3ML) 0.083% nebulizer solution Take 2.5 mg by nebulization every 2 (two) hours as needed for wheezing or shortness of breath.  . Alpha-D-Galactosidase (BEANO PO) Take 1 tablet by mouth daily.  Marland Kitchen Apremilast (OTEZLA) 30 MG TABS Take 30 mg by mouth daily.   Marland Kitchen aspirin EC 81 MG tablet Take 81 mg by mouth daily.  Marland Kitchen atorvastatin (LIPITOR) 20 MG tablet Take 20 mg by mouth at bedtime.  . cloNIDine (CATAPRES) 0.1 MG tablet Take 0.1 mg by mouth 2 (two) times daily.   Marland Kitchen esomeprazole (NEXIUM) 40 MG capsule Take 40 mg by mouth 2 (two) times daily before a meal.  . ferrous sulfate 325 (65 FE) MG tablet Take 325 mg by mouth daily with breakfast.  . fluticasone (FLONASE) 50 MCG/ACT nasal spray Place 2 sprays into both nostrils at bedtime.  . Fluticasone-Salmeterol (ADVAIR) 250-50 MCG/DOSE AEPB Inhale 1 puff into the lungs 2 (two) times daily.  Marland Kitchen guaiFENesin (MUCINEX) 600 MG 12 hr tablet Take 600 mg by mouth 2 (two) times daily as needed for cough or to  loosen phlegm (congestion).   . hydrALAZINE (APRESOLINE) 25 MG tablet Take 1 tablet (25 mg total) by mouth 3 (three) times daily.  . hydrOXYzine (ATARAX/VISTARIL) 25 MG tablet Take 25 mg by mouth every 4 (four) hours as needed for itching.  . Insulin Detemir (LEVEMIR) 100 UNIT/ML Pen Inject 40 Units into the skin 2 (two) times daily.  . insulin lispro (HUMALOG KWIKPEN) 100 UNIT/ML SOPN Inject 35 Units into the skin 3 (three) times daily. Take 35 units  (+4-12 units based on sliding scale) every morning, 40 units (+4-12 units) in the afternoon and 40 units (+4-12 units) in the evening.  . isosorbide dinitrate (ISORDIL) 20 MG tablet Take 20 mg by mouth 3 (three) times daily.  Marland Kitchen ketorolac (ACULAR LS) 0.4 % SOLN Place 1 drop into both eyes 3 (three) times daily.   . Linaclotide (LINZESS) 290 MCG CAPS capsule Take 290 mcg by mouth every other day.  Marland Kitchen LORazepam (ATIVAN) 1 MG tablet Take 1 mg by mouth at bedtime as needed for sleep.   . metoprolol tartrate (LOPRESSOR) 25 MG tablet Take 12.5 mg by mouth 2 (two) times daily.  . Multiple Vitamins-Minerals (MULTIVITAMIN WITH MINERALS) tablet Take 1 tablet by mouth daily.  . predniSONE (DELTASONE) 5 MG tablet Take 5 mg by mouth daily with breakfast.  . Probiotic Product (PROBIOTIC PO) Take 1 capsule by mouth daily.  Marland Kitchen torsemide (DEMADEX) 20 MG tablet Take 40 mg by mouth daily.   Marland Kitchen triamcinolone ointment (KENALOG) 0.1 % Apply 1 application topically 2 (two) times daily as needed (psoriasis).    Allergies  Allergen Reactions  . Adhesive [Tape] Other (See Comments)    Blisters   . Avelox [Moxifloxacin Hcl In Nacl] Other (See Comments)    GI upset  . Codeine Other (See Comments)    "crazy"   . Guaifenesin Nausea And Vomiting    Takes Mucinex at home without issue  . Latex Swelling  . Oxycodone Nausea And Vomiting    Takes Percocet at home without issue    Current Medications, Allergies, Past Medical History, Past Surgical History, Family History,  and Social History were reviewed in [employer] record.  Review of Systems         See HPI - all other systems neg except as noted... The patient complains of dyspnea on exertion.  The patient denies anorexia, fever, weight loss, weight gain, vision loss, decreased hearing, hoarseness, chest pain, syncope, peripheral edema, prolonged cough, headaches, hemoptysis, abdominal pain, melena, hematochezia, severe indigestion/heartburn, hematuria, incontinence, muscle weakness, suspicious skin lesions, transient blindness, difficulty walking, depression, unusual weight change, abnormal bleeding, enlarged lymph nodes, and angioedema.     Objective:   Physical Exam      WD, Overweight, 68 y/o BF in NAD...  GENERAL:  Alert & oriented; pleasant & cooperative... HEENT:  [place]/AT, EOM-wnl, PERRLA, EACs-clear, TMs-wnl, NOSE-clear, THROAT- clear & wnl... NECK:  Supple w/ fair ROM; no JVD; normal carotid impulses w/o bruits; palp thyroid, w/o nodules felt; no lymphadenopathy. CHEST:  Clear to P & A; without wheezes/ rales/ or rhonchi. HEART:  Regular Rhythm; gr 1/6 SEM, without rubs/ or gallops. ABDOMEN:  Scar from splenectomy surg; obese, soft, & nontender w/ panniculus; normal bowel sounds; no organomegaly or masses detected EXT: without deformities, mod arthritic changes, s/p bilat TKRs; no varicose veins/ +venous insuffic/ tr edema. NEURO:  CN's intact; no focal neuro deficits... DERM:  Psoriasis rash per DrTafeen...  RADIOLOGY DATA:  Reviewed in the EPIC EMR & discussed w/ the patient...  LABORATORY DATA:  Reviewed in the EPIC EMR & discussed w/ the patient...   Assessment & Plan:    SARCOIDOSIS>  Main manifestation has been her hypercalcemia> she has proven that when her Pred is weaned to 60mQod the hypercalcemia returns; we are trying to determine her lowest dose threshold & Ca is now 8.8-9.9 on Pred540md; she does not want to attempt further wean at this time; we decided  to continue the same dose for now...  Hypercalcemia>  Initially responded to splenectomy but her calcium level rose again over several months after the surg; we were forced to use Pred rx in this difficult diabetic; we had her see DrAltheimer for DM/ endocrine consult; calcium levels have responded nicely to Pred Rx but climbed back up when Pred was weaned to 50m79md early in 2014 (see above)... Ca=8.8-9.9 now on Pred50mg450m..  ASTHMATIC BRONCHITS>  ER 11/13 bumped her Pred to 40mg97mhich negated our slow taper w/ monitoring of her Calcium levels; NEB Rx w/ albut seemed to give the the max benefit & we need to wean down the Pred as much as poss; she is also hypoxemic w/ O2sat=84% on RA (likely from V/Q mismatching);  We decided to Rx w/ HomeO2- and she improved over time; now on AdvaiWrightstowneeded, plus the Pred50mg u650m for her hypercalcemia...  RESTRICTIVE LUNG DISEASE> from her Obesity & Sarcoid; encouraged to lose weight...   She has established [facility] w/ [facility]y [facility]>> HBP>   DIASTOLIC CHF>   DrMcLean & DrAllred for Cards... DM>   Dr Altheimer, Endocrine... Renal Insuffic>  DrSanford, Nephrology & DrFields for vasc access... Psoriasis>   DrTafeen for Derm..Ryder Systemher medical problems as noted...   Patient's Medications  New Prescriptions   No medications on file  Previous Medications   ACETAMINOPHEN (TYLENOL) 500 MG TABLET    Take 500 mg by mouth 2 (two) times daily as needed (pain).    ALBUTEROL (PROVENTIL HFA;VENTOLIN HFA) 108 (90 BASE) MCG/ACT INHALER    Inhale 2 puffs into the lungs every 4 (four) hours as needed for wheezing or shortness of breath.  ALBUTEROL (PROVENTIL) (2.5 MG/3ML) 0.083% NEBULIZER SOLUTION    Take 2.5 mg by nebulization every 2 (two) hours as needed for wheezing or shortness of breath.   ALPHA-D-GALACTOSIDASE (BEANO PO)    Take 1 tablet by mouth daily.   APREMILAST (OTEZLA) 30 MG TABS    Take 30 mg by mouth daily.     ASPIRIN EC 81 MG TABLET    Take 81 mg by mouth daily.   ATORVASTATIN (LIPITOR) 20 MG TABLET    Take 20 mg by mouth at bedtime.   CLONIDINE (CATAPRES) 0.1 MG TABLET    Take 0.1 mg by mouth 2 (two) times daily.    ESOMEPRAZOLE (NEXIUM) 40 MG CAPSULE    Take 40 mg by mouth 2 (two) times daily before a meal.   FERROUS SULFATE 325 (65 FE) MG TABLET    Take 325 mg by mouth daily with breakfast.   FLUTICASONE (FLONASE) 50 MCG/ACT NASAL SPRAY    Place 2 sprays into both nostrils at bedtime.   FLUTICASONE-SALMETEROL (ADVAIR) 250-50 MCG/DOSE AEPB    Inhale 1 puff into the lungs 2 (two) times daily.   GUAIFENESIN (MUCINEX) 600 MG 12 HR TABLET    Take 600 mg by mouth 2 (two) times daily as needed for cough or to loosen phlegm (congestion).    HYDRALAZINE (APRESOLINE) 25 MG TABLET    Take 1 tablet (25 mg total) by mouth 3 (three) times daily.   HYDROXYZINE (ATARAX/VISTARIL) 25 MG TABLET    Take 25 mg by mouth every 4 (four) hours as needed for itching.   INSULIN DETEMIR (LEVEMIR) 100 UNIT/ML PEN    Inject 40 Units into the skin 2 (two) times daily.   INSULIN LISPRO (HUMALOG KWIKPEN) 100 UNIT/ML SOPN    Inject 35 Units into the skin 3 (three) times daily. Take 35 units  (+4-12 units based on sliding scale) every morning, 40 units (+4-12 units) in the afternoon and 40 units (+4-12 units) in the evening.   ISOSORBIDE DINITRATE (ISORDIL) 20 MG TABLET    Take 20 mg by mouth 3 (three) times daily.   KETOROLAC (ACULAR LS) 0.4 % SOLN    Place 1 drop into both eyes 3 (three) times daily.    LINACLOTIDE (LINZESS) 290 MCG CAPS CAPSULE    Take 290 mcg by mouth every other day.   LORAZEPAM (ATIVAN) 1 MG TABLET    Take 1 mg by mouth at bedtime as needed for sleep.    METOPROLOL TARTRATE (LOPRESSOR) 25 MG TABLET    Take 12.5 mg by mouth 2 (two) times daily.   MULTIPLE VITAMINS-MINERALS (MULTIVITAMIN WITH MINERALS) TABLET    Take 1 tablet by mouth daily.   PREDNISONE (DELTASONE) 5 MG TABLET    Take 5 mg by mouth daily with  breakfast.   PROBIOTIC PRODUCT (PROBIOTIC PO)    Take 1 capsule by mouth daily.   TORSEMIDE (DEMADEX) 20 MG TABLET    Take 40 mg by mouth daily.    TRIAMCINOLONE OINTMENT (KENALOG) 0.1 %    Apply 1 application topically 2 (two) times daily as needed (psoriasis).  Modified Medications   No medications on file  Discontinued Medications   No medications on file

## 2014-07-17 NOTE — Patient Instructions (Signed)
Today we updated your med list in our EPIC system...    Continue your current medications the same...  We decided to continue the PREDNISONE 5mg  daily...  Today we checked your ACE  (Sarcoid blood test)...    We will contact you w/ the results when available...   Call for any questions...  Let's plan a follow up visit in 33mo, sooner if needed for problems.Marland KitchenMarland Kitchen

## 2014-07-18 LAB — ANGIOTENSIN CONVERTING ENZYME: Angiotensin-Converting Enzyme: 42 U/L (ref 8–52)

## 2014-07-22 ENCOUNTER — Telehealth: Payer: Self-pay | Admitting: Pulmonary Disease

## 2014-07-22 NOTE — Telephone Encounter (Signed)
Notes Recorded by Noralee Space, MD on 07/18/2014 at 8:52 AM Please notify patient>  ACE (sarcoid blood test) remains wnl, no change on Pred 5mg /d... REC- continue same Pred... --------------------------------------------------  Pt aware of results.  Nothing further needed.

## 2014-08-02 ENCOUNTER — Ambulatory Visit (HOSPITAL_BASED_OUTPATIENT_CLINIC_OR_DEPARTMENT_OTHER): Payer: Medicare Other | Attending: Pulmonary Disease

## 2014-08-09 ENCOUNTER — Ambulatory Visit: Payer: Medicare Other | Admitting: Cardiology

## 2014-08-09 ENCOUNTER — Encounter: Payer: Self-pay | Admitting: Cardiovascular Disease

## 2014-08-10 ENCOUNTER — Emergency Department (HOSPITAL_COMMUNITY)
Admission: EM | Admit: 2014-08-10 | Discharge: 2014-08-10 | Disposition: A | Discharge status: Home / Self Care | Payer: Medicare Other | Attending: Emergency Medicine | Admitting: Emergency Medicine

## 2014-08-10 ENCOUNTER — Encounter (HOSPITAL_COMMUNITY): Payer: Self-pay | Admitting: Family Medicine

## 2014-08-10 DIAGNOSIS — Z91040 Latex allergy status: Secondary | ICD-10-CM | POA: Insufficient documentation

## 2014-08-10 DIAGNOSIS — I129 Hypertensive chronic kidney disease with stage 1 through stage 4 chronic kidney disease, or unspecified chronic kidney disease: Secondary | ICD-10-CM | POA: Diagnosis not present

## 2014-08-10 DIAGNOSIS — Z794 Long term (current) use of insulin: Secondary | ICD-10-CM | POA: Diagnosis not present

## 2014-08-10 DIAGNOSIS — Z7952 Long term (current) use of systemic steroids: Secondary | ICD-10-CM | POA: Diagnosis not present

## 2014-08-10 DIAGNOSIS — Z79899 Other long term (current) drug therapy: Secondary | ICD-10-CM | POA: Diagnosis not present

## 2014-08-10 DIAGNOSIS — Z9889 Other specified postprocedural states: Secondary | ICD-10-CM | POA: Diagnosis not present

## 2014-08-10 DIAGNOSIS — I5032 Chronic diastolic (congestive) heart failure: Secondary | ICD-10-CM | POA: Diagnosis not present

## 2014-08-10 DIAGNOSIS — D860 Sarcoidosis of lung: Secondary | ICD-10-CM | POA: Diagnosis not present

## 2014-08-10 DIAGNOSIS — N184 Chronic kidney disease, stage 4 (severe): Secondary | ICD-10-CM | POA: Diagnosis not present

## 2014-08-10 DIAGNOSIS — Z90710 Acquired absence of both cervix and uterus: Secondary | ICD-10-CM | POA: Diagnosis not present

## 2014-08-10 DIAGNOSIS — G8929 Other chronic pain: Secondary | ICD-10-CM | POA: Diagnosis not present

## 2014-08-10 DIAGNOSIS — J449 Chronic obstructive pulmonary disease, unspecified: Secondary | ICD-10-CM | POA: Diagnosis not present

## 2014-08-10 DIAGNOSIS — R110 Nausea: Secondary | ICD-10-CM | POA: Insufficient documentation

## 2014-08-10 DIAGNOSIS — M109 Gout, unspecified: Secondary | ICD-10-CM | POA: Insufficient documentation

## 2014-08-10 DIAGNOSIS — Z8701 Personal history of pneumonia (recurrent): Secondary | ICD-10-CM | POA: Insufficient documentation

## 2014-08-10 DIAGNOSIS — R1084 Generalized abdominal pain: Secondary | ICD-10-CM | POA: Insufficient documentation

## 2014-08-10 DIAGNOSIS — Z9981 Dependence on supplemental oxygen: Secondary | ICD-10-CM | POA: Insufficient documentation

## 2014-08-10 DIAGNOSIS — G4733 Obstructive sleep apnea (adult) (pediatric): Secondary | ICD-10-CM | POA: Insufficient documentation

## 2014-08-10 DIAGNOSIS — Z86010 Personal history of colonic polyps: Secondary | ICD-10-CM | POA: Diagnosis not present

## 2014-08-10 DIAGNOSIS — Z7982 Long term (current) use of aspirin: Secondary | ICD-10-CM | POA: Insufficient documentation

## 2014-08-10 DIAGNOSIS — I2510 Atherosclerotic heart disease of native coronary artery without angina pectoris: Secondary | ICD-10-CM | POA: Diagnosis not present

## 2014-08-10 DIAGNOSIS — E785 Hyperlipidemia, unspecified: Secondary | ICD-10-CM | POA: Insufficient documentation

## 2014-08-10 DIAGNOSIS — Z8719 Personal history of other diseases of the digestive system: Secondary | ICD-10-CM | POA: Insufficient documentation

## 2014-08-10 DIAGNOSIS — F419 Anxiety disorder, unspecified: Secondary | ICD-10-CM | POA: Insufficient documentation

## 2014-08-10 DIAGNOSIS — R109 Unspecified abdominal pain: Secondary | ICD-10-CM

## 2014-08-10 DIAGNOSIS — K589 Irritable bowel syndrome without diarrhea: Secondary | ICD-10-CM | POA: Diagnosis not present

## 2014-08-10 DIAGNOSIS — Z9089 Acquired absence of other organs: Secondary | ICD-10-CM | POA: Diagnosis not present

## 2014-08-10 DIAGNOSIS — E119 Type 2 diabetes mellitus without complications: Secondary | ICD-10-CM | POA: Diagnosis not present

## 2014-08-10 DIAGNOSIS — M1990 Unspecified osteoarthritis, unspecified site: Secondary | ICD-10-CM | POA: Insufficient documentation

## 2014-08-10 DIAGNOSIS — Z791 Long term (current) use of non-steroidal anti-inflammatories (NSAID): Secondary | ICD-10-CM | POA: Diagnosis not present

## 2014-08-10 LAB — URINALYSIS, ROUTINE W REFLEX MICROSCOPIC
Bilirubin Urine: NEGATIVE
Glucose, UA: NEGATIVE mg/dL
Hgb urine dipstick: NEGATIVE
Ketones, ur: NEGATIVE mg/dL
Nitrite: NEGATIVE
Specific Gravity, Urine: 1.02 (ref 1.005–1.030)
UROBILINOGEN UA: 0.2 mg/dL (ref 0.0–1.0)
pH: 5 (ref 5.0–8.0)

## 2014-08-10 LAB — CBC WITH DIFFERENTIAL/PLATELET
Basophils Absolute: 0 10*3/uL (ref 0.0–0.1)
Basophils Relative: 0 % (ref 0–1)
EOS ABS: 0.2 10*3/uL (ref 0.0–0.7)
Eosinophils Relative: 1 % (ref 0–5)
HEMATOCRIT: 41.7 % (ref 36.0–46.0)
HEMOGLOBIN: 13.3 g/dL (ref 12.0–15.0)
Lymphocytes Relative: 24 % (ref 12–46)
Lymphs Abs: 2.9 10*3/uL (ref 0.7–4.0)
MCH: 28.1 pg (ref 26.0–34.0)
MCHC: 31.9 g/dL (ref 30.0–36.0)
MCV: 88 fL (ref 78.0–100.0)
MONO ABS: 1.1 10*3/uL — AB (ref 0.1–1.0)
MONOS PCT: 9 % (ref 3–12)
NEUTROS ABS: 7.8 10*3/uL — AB (ref 1.7–7.7)
NEUTROS PCT: 66 % (ref 43–77)
PLATELETS: 582 10*3/uL — AB (ref 150–400)
RBC: 4.74 MIL/uL (ref 3.87–5.11)
RDW: 15.4 % (ref 11.5–15.5)
WBC: 11.9 10*3/uL — ABNORMAL HIGH (ref 4.0–10.5)

## 2014-08-10 LAB — URINE MICROSCOPIC-ADD ON

## 2014-08-10 LAB — COMPREHENSIVE METABOLIC PANEL
ALT: 15 U/L (ref 0–35)
AST: 23 U/L (ref 0–37)
Albumin: 3.1 g/dL — ABNORMAL LOW (ref 3.5–5.2)
Alkaline Phosphatase: 125 U/L — ABNORMAL HIGH (ref 39–117)
Anion gap: 10 (ref 5–15)
BILIRUBIN TOTAL: 0.4 mg/dL (ref 0.3–1.2)
BUN: 46 mg/dL — ABNORMAL HIGH (ref 6–23)
CHLORIDE: 101 meq/L (ref 96–112)
CO2: 23 mmol/L (ref 19–32)
Calcium: 9.6 mg/dL (ref 8.4–10.5)
Creatinine, Ser: 3.37 mg/dL — ABNORMAL HIGH (ref 0.50–1.10)
GFR calc Af Amer: 15 mL/min — ABNORMAL LOW (ref >90)
GFR calc non Af Amer: 13 mL/min — ABNORMAL LOW (ref >90)
GLUCOSE: 204 mg/dL — AB (ref 70–99)
Potassium: 3.9 mmol/L (ref 3.5–5.1)
Sodium: 134 mmol/L — ABNORMAL LOW (ref 135–145)
Total Protein: 7 g/dL (ref 6.0–8.3)

## 2014-08-10 LAB — LIPASE, BLOOD: Lipase: 30 U/L (ref 11–59)

## 2014-08-10 MED ORDER — ONDANSETRON HCL 4 MG PO TABS: 4.0000 mg | ORAL_TABLET | Freq: Four times a day (QID) | ORAL | Status: DC

## 2014-08-10 MED ORDER — MORPHINE SULFATE 4 MG/ML IJ SOLN
4.0000 mg | Freq: Once | INTRAMUSCULAR | Status: AC
  Administered 2014-08-10: 4 mg via INTRAVENOUS
  Filled 2014-08-10: qty 1

## 2014-08-10 MED ORDER — MORPHINE SULFATE 4 MG/ML IJ SOLN: 4.0000 mg | Freq: Once | INTRAMUSCULAR | Status: DC

## 2014-08-10 MED ORDER — ONDANSETRON 4 MG PO TBDP
4.0000 mg | ORAL_TABLET | Freq: Once | ORAL | Status: AC
  Administered 2014-08-10: 4 mg via ORAL
  Filled 2014-08-10: qty 1

## 2014-08-10 MED ORDER — HYDROMORPHONE HCL 1 MG/ML IJ SOLN
1.0000 mg | Freq: Once | INTRAMUSCULAR | Status: AC
  Administered 2014-08-10: 1 mg via INTRAVENOUS
  Filled 2014-08-10: qty 1

## 2014-08-10 NOTE — ED Notes (Signed)
Pt able to ambulate to the restroom using cane. Pt placed on monitor upon return to room from restroom. Pt continues to be monitored by blood pressure and pulse ox. Pt wasn't able to provide enough urine to send specimen to the lab. Tori, PA made aware.

## 2014-08-10 NOTE — ED Notes (Signed)
Pt states diffuse abdominal pain x1 week. 8/10 pain upon arrival to ED. Also reports nausea. Denies urinary symptoms. Abdomen soft and non tender to palpation. Bowel sounds present all quadrants. Pt is alert and oriented x4.

## 2014-08-10 NOTE — ED Notes (Signed)
Pt remains monitored by blood pressure and pulse ox.  

## 2014-08-10 NOTE — ED Notes (Signed)
Pt here for generalized lower abdominal pain x 1 week with nausea. sts LBM yesterday and normal.

## 2014-08-10 NOTE — ED Notes (Signed)
Pt placed on monitor upon arrival to room. Pt monitored by blood pressure and pulse ox. Pts family remains at bedside.

## 2014-08-10 NOTE — ED Provider Notes (Signed)
CSN: [id number]     Arrival date & time 08/10/14  1129 History   First MD Initiated Contact with Patient 08/10/14 1138     Chief Complaint  Patient presents with  . Abdominal Pain     (Consider location/radiation/quality/duration/timing/severity/associated sxs/prior Treatment) Patient is a 69 y.o. female presenting with abdominal pain. The history is provided by the patient. No language interpreter was used.  Abdominal Pain Pain location:  Generalized Pain quality: aching   Pain quality comment:  Exactly like her other abdominal pain flareups Pain radiates to:  Does not radiate Onset quality:  Gradual Duration:  1 week Timing:  Constant Chronicity:  Chronic Context: eating and previous surgery   Context: not diet changes and not recent illness   Context comment:  Appendectomy cholecystectomy, abdominal hysterectomy, vesicovaginal fistula closure Associated symptoms: nausea   Associated symptoms: no anorexia, no constipation, no diarrhea, no dysuria, no fever, no hematemesis, no hematochezia, no hematuria, no melena, no shortness of breath and no vomiting    No urinary symptoms. Pt with same symptoms in October with unremarkable CT.  Past Medical History  Diagnosis Date  . Essential hypertension, benign   . Coronary atherosclerosis of native coronary artery 01/04/2006    Tiny OM1 70-90% ostial stenosis, no other CAD  . COPD (chronic obstructive pulmonary disease)   . Esophageal reflux   . Dyslipidemia   . Gout   . Colon polyps     Tubular adenomatous polyps  . Spinal stenosis of lumbar region   . Bulging lumbar disc   . Chronic diastolic heart failure   . PNA (pneumonia) 2012    With pleural effusion, requiring thoracentesis  . Chronic bronchitis   . On home oxygen therapy   . OSA (obstructive sleep apnea)     Marginally compliant with CPAP  . Type II diabetes mellitus   . Arthritis   . Chronic lower back pain   . Diverticulosis   . GERD (gastroesophageal reflux  disease)   . Anxiety   . Hyperlipidemia   . Sarcoidosis of lung   . CKD (chronic kidney disease) stage 4, GFR 15-29 ml/min   . IBS (irritable bowel syndrome)   . Cataracts, both eyes   . HENIDPOE(423.5)    Past Surgical History  Procedure Laterality Date  . Vesicovaginal fistula closure w/  total abdominal hysterectomy  1992  . Bilateral total knee replacements Bilateral Rt=5/04 & Lft=1/09    by DrAlusio  . 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 Zella Richer  . Appendectomy  1957  . Tonsillectomy  1968  . Cholecystectomy  1980's  . Abdominal hysterectomy  1992  . Reduction mammaplasty Bilateral 1980's  . Cardiac catheterization  1990's  . Thoracentesis  2012  . Joint replacement    . Av fistula placement Left 12/31/2013    Procedure: ARTERIOVENOUS (AV) FISTULA CREATION;  Surgeon: Elam Dutch, MD;  Location: [place];  Service: Vascular;  Laterality: Left;  . Colonoscopy w/ biopsies and polypectomy    . Av fistula placement Left 03/18/2014    Procedure: ARTERIOVENOUS (AV) FISTULA CREATION- LEFT BRACHIOCEPHALIC ;  Surgeon: Elam Dutch, MD;  Location: [facility] OR;  Service: Vascular;  Laterality: Left;  . Ligation of arteriovenous  fistula Left 03/18/2014    Procedure: LIGATION OF ARTERIOVENOUS  FISTULA- LEFT RADIOCEPHALIC;  Surgeon: Elam Dutch, MD;  Location: [facility] OR;  Service: Vascular;  Laterality: Left;   Family History  Problem Relation Age of Onset  . Diabetes Mother   .  Heart disease Mother   . Hyperlipidemia Mother   . Varicose Veins Mother   . Breast cancer Maternal Aunt   . Heart disease Father   . Deep vein thrombosis Father   . Hyperlipidemia Father   . Heart disease Sister     before age 39  . Cancer Sister   . Diabetes Sister   . Hyperlipidemia Sister   . Heart attack Sister   . Heart disease Brother   . Diabetes Brother   . Hyperlipidemia Brother   . Heart attack Brother   . Hyperlipidemia Sister    History  Substance Use Topics  . Smoking status: Never  Smoker   . Smokeless tobacco: Never Used  . Alcohol Use: No   OB History    No data available     Review of Systems  Constitutional: Negative for fever.  Respiratory: Negative for shortness of breath.   Gastrointestinal: Positive for nausea and abdominal pain. Negative for vomiting, diarrhea, constipation, melena, hematochezia, anorexia and hematemesis.  Genitourinary: Negative for dysuria and hematuria.  All other systems reviewed and are negative.     Allergies  Adhesive; Avelox; Codeine; Guaifenesin; Latex; and Oxycodone  Home Medications   Prior to Admission medications   Medication Sig Start Date End Date Taking? Authorizing Provider  acetaminophen (TYLENOL) 500 MG tablet Take 500 mg by mouth 2 (two) times daily as needed (pain).    Yes Historical Provider, MD  albuterol (PROVENTIL HFA;VENTOLIN HFA) 108 (90 BASE) MCG/ACT inhaler Inhale 2 puffs into the lungs every 4 (four) hours as needed for wheezing or shortness of breath.    Yes Historical Provider, MD  albuterol (PROVENTIL) (2.5 MG/3ML) 0.083% nebulizer solution Take 2.5 mg by nebulization every 2 (two) hours as needed for wheezing or shortness of breath.   Yes Historical Provider, MD  Alpha-D-Galactosidase (BEANO PO) Take 1 tablet by mouth daily.   Yes Historical Provider, MD  Apremilast (OTEZLA) 30 MG TABS Take 30 mg by mouth daily.    Yes Historical Provider, MD  aspirin EC 81 MG tablet Take 81 mg by mouth daily.   Yes Historical Provider, MD  atorvastatin (LIPITOR) 20 MG tablet Take 20 mg by mouth at bedtime.   Yes Historical Provider, MD  cloNIDine (CATAPRES) 0.1 MG tablet Take 0.1 mg by mouth 2 (two) times daily.  02/12/14  Yes Historical Provider, MD  esomeprazole (NEXIUM) 40 MG capsule Take 40 mg by mouth 2 (two) times daily before a meal.   Yes Historical Provider, MD  ferrous sulfate 325 (65 FE) MG tablet Take 325 mg by mouth daily with breakfast.   Yes Historical Provider, MD  fluticasone (FLONASE) 50 MCG/ACT  nasal spray Place 2 sprays into both nostrils at bedtime.   Yes Historical Provider, MD  Fluticasone-Salmeterol (ADVAIR) 250-50 MCG/DOSE AEPB Inhale 1 puff into the lungs 2 (two) times daily.   Yes Historical Provider, MD  guaiFENesin (MUCINEX) 600 MG 12 hr tablet Take 600 mg by mouth 2 (two) times daily as needed for cough or to loosen phlegm (congestion).    Yes Historical Provider, MD  hydrALAZINE (APRESOLINE) 25 MG tablet Take 1 tablet (25 mg total) by mouth 3 (three) times daily. 06/21/14  Yes Sueanne Margarita, MD  hydrOXYzine (ATARAX/VISTARIL) 25 MG tablet Take 25 mg by mouth every 4 (four) hours as needed for itching.   Yes Historical Provider, MD  Insulin Detemir (LEVEMIR) 100 UNIT/ML Pen Inject 40 Units into the skin 2 (two) times daily. 05/01/14  Yes  Hosie Poisson, MD  insulin lispro (HUMALOG KWIKPEN) 100 UNIT/ML SOPN Inject 35 Units into the skin 3 (three) times daily. Take 35 units  (+4-12 units based on sliding scale) every morning, 40 units (+4-12 units) in the afternoon and 40 units (+4-12 units) in the evening.   Yes Historical Provider, MD  isosorbide dinitrate (ISORDIL) 20 MG tablet Take 20 mg by mouth 3 (three) times daily.   Yes Historical Provider, MD  ketorolac (ACULAR LS) 0.4 % SOLN Place 1 drop into both eyes 2 (two) times daily.    Yes Historical Provider, MD  Linaclotide (LINZESS) 290 MCG CAPS capsule Take 290 mcg by mouth every other day. 06/14/13  Yes Irene Shipper, MD  LORazepam (ATIVAN) 1 MG tablet Take 1 mg by mouth at bedtime as needed for sleep.    Yes Historical Provider, MD  metoprolol succinate (TOPROL-XL) 25 MG 24 hr tablet Take 12.5 mg by mouth 2 (two) times daily.   Yes Historical Provider, MD  Multiple Vitamins-Minerals (MULTIVITAMIN WITH MINERALS) tablet Take 1 tablet by mouth daily.   Yes Historical Provider, MD  predniSONE (DELTASONE) 5 MG tablet Take 5 mg by mouth daily with breakfast.   Yes Historical Provider, MD  pregabalin (LYRICA) 100 MG capsule Take 100 mg  by mouth daily.   Yes Historical Provider, MD  Probiotic Product (PROBIOTIC PO) Take 1 capsule by mouth daily.   Yes Historical Provider, MD  torsemide (DEMADEX) 20 MG tablet Take 40 mg by mouth daily.    Yes Historical Provider, MD  traMADol (ULTRAM) 50 MG tablet Take 50 mg by mouth 2 (two) times daily.   Yes Historical Provider, MD  triamcinolone ointment (KENALOG) 0.1 % Apply 1 application topically 2 (two) times daily as needed (psoriasis).   Yes Historical Provider, MD  ondansetron (ZOFRAN) 4 MG tablet Take 1 tablet (4 mg total) by mouth every 6 (six) hours. 08/10/14   Jordan Hawks L Rianna Lukes, PA-C   BP 116/80 mmHg  Pulse 95  Temp(Src) 97.4 F (36.3 C)  Resp 20  Wt 208 lb (94.348 kg)  SpO2 96% Physical Exam  Constitutional: She appears well-developed and well-nourished. No distress.  HENT:  Head: Normocephalic and atraumatic.  Mouth/Throat: Oropharynx is clear and moist.  Eyes: Conjunctivae and EOM are normal. Right eye exhibits no discharge. Left eye exhibits no discharge.  Cardiovascular: Normal rate, regular rhythm and normal heart sounds.   Pulmonary/Chest: Effort normal and breath sounds normal. No respiratory distress. She has no wheezes.  Abdominal: Soft. Bowel sounds are normal. She exhibits no distension.  Diffuse abdominal tenderness without rebound, rigidity, guarding. No CVA tenderness or back tenderness  Neurological: She is alert. She exhibits normal muscle tone. Coordination normal.  Skin: Skin is warm and dry. She is not diaphoretic.  Nursing note and vitals reviewed.   ED Course  Procedures (including critical care time) Labs Review Labs Reviewed  CBC WITH DIFFERENTIAL - Abnormal; Notable for the following:    WBC 11.9 (*)    Platelets 582 (*)    Neutro Abs 7.8 (*)    Monocytes Absolute 1.1 (*)    All other components within normal limits  COMPREHENSIVE METABOLIC PANEL - Abnormal; Notable for the following:    Sodium 134 (*)    Glucose, Bld 204 (*)    BUN 46  (*)    Creatinine, Ser 3.37 (*)    Albumin 3.1 (*)    Alkaline Phosphatase 125 (*)    GFR calc non Af Amer 13 (*)  GFR calc Af Amer 15 (*)    All other components within normal limits  URINALYSIS, ROUTINE W REFLEX MICROSCOPIC - Abnormal; Notable for the following:    APPearance CLOUDY (*)    Protein, ur >300 (*)    Leukocytes, UA SMALL (*)    All other components within normal limits  URINE MICROSCOPIC-ADD ON - Abnormal; Notable for the following:    Squamous Epithelial / LPF FEW (*)    Bacteria, UA FEW (*)    All other components within normal limits  LIPASE, BLOOD    Imaging Review No results found.   EKG Interpretation None      MDM   Final diagnoses:  Chronic abdominal pain  Nausea   Pt presenting with abdominal pain that is diffuse and exactly the same as her presentation in October. Pt with CT abdomen without acute abnormalities at that time. Today pt afebrile with only abdominal symptom is nausea which has resolved. No vomiting. Pain managed in ED. VSS. Generalized abdominal tenderness without evidence of peritonitis. Patient has a nonsurgical abdomen. Labs with mild leukocytosis but no significant electrolyte abnormalities and with baseline renal function. Patient with asymptomatic bacteriuria. On reexamination abdomen mildly tender with improvement and no evidence of peritonitis. Patient to follow-up with her gastroenterologist as soon as possible for her chronic abdominal pain. She's been given strict return precautions.  Discussed return precautions with patient. Discussed all results and patient verbalizes understanding and agrees with plan.  This is a shared patient. This patient was discussed with the physician, Dr. Tomi Bamberger who saw and evaluated the patient and agrees with the plan.     Pura Spice, PA-C 08/10/14 [place], MD 08/11/14 [phone]

## 2014-08-10 NOTE — ED Notes (Signed)
Pt ambulated to the restroom using cane. Pt was able to provide urine specimen with out needing in and out cath. Pt placed on monitor upon return to room from restroom. Pt continues to be monitored by blood pressure and pulse ox. Pts family remains at bedside.

## 2014-08-10 NOTE — ED Notes (Signed)
Pt states 9/10 chronic diffuse abdominal pain at present. No relief with pain medication. Pt up to bathroom without difficulty.

## 2014-08-10 NOTE — ED Notes (Signed)
Pt unable to void at the time. Drinking water. ED PA aware.

## 2014-08-10 NOTE — Discharge Instructions (Signed)
Return to the emergency room with worsening of symptoms, new symptoms or with symptoms that are concerning, especially fevers unable to tolerate fluids by mouth, blood in stool or vomit, black stools, chest pain, shortness of breath, feeling faint. Call to make an appointment with your GI physician for follow-up. Read the below instructions and follow the recommendations.  Abdominal Pain Many things can cause abdominal pain. Usually, abdominal pain is not caused by a disease and will improve without treatment. It can often be observed and treated at home. Your health care provider will do a physical exam and possibly order blood tests and X-rays to help determine the seriousness of your pain. However, in many cases, more time must pass before a clear cause of the pain can be found. Before that point, your health care provider may not know if you need more testing or further treatment. HOME CARE INSTRUCTIONS  Monitor your abdominal pain for any changes. The following actions may help to alleviate any discomfort you are experiencing:  Only take over-the-counter or prescription medicines as directed by your health care provider.  Do not take laxatives unless directed to do so by your health care provider.  Try a clear liquid diet (broth, tea, or water) as directed by your health care provider. Slowly move to a bland diet as tolerated. SEEK MEDICAL CARE IF:  You have unexplained abdominal pain.  You have abdominal pain associated with nausea or diarrhea.  You have pain when you urinate or have a bowel movement.  You experience abdominal pain that wakes you in the night.  You have abdominal pain that is worsened or improved by eating food.  You have abdominal pain that is worsened with eating fatty foods.  You have a fever. SEEK IMMEDIATE MEDICAL CARE IF:   Your pain does not go away within 2 hours.  You keep throwing up (vomiting).  Your pain is felt only in portions of the abdomen,  such as the right side or the left lower portion of the abdomen.  You pass bloody or black tarry stools. MAKE SURE YOU:  Understand these instructions.   Will watch your condition.   Will get help right away if you are not doing well or get worse.  Document Released: 04/28/2005 Document Revised: 07/24/2013 Document Reviewed: 03/28/2013 [facility] Patient Information 2015 [place], [place]. This information is not intended to replace advice given to you by your health care provider. Make sure you discuss any questions you have with your health care provider.

## 2014-08-30 ENCOUNTER — Encounter (HOSPITAL_COMMUNITY): Payer: Self-pay

## 2014-08-30 ENCOUNTER — Emergency Department (HOSPITAL_COMMUNITY): Payer: Medicare Other

## 2014-08-30 ENCOUNTER — Inpatient Hospital Stay (HOSPITAL_COMMUNITY)
Admission: EM | Admit: 2014-08-30 | Discharge: 2014-09-09 | DRG: 291 | Disposition: A | Discharge status: Home / Self Care | Payer: Medicare Other | Attending: Internal Medicine | Admitting: Internal Medicine

## 2014-08-30 DIAGNOSIS — N189 Chronic kidney disease, unspecified: Secondary | ICD-10-CM

## 2014-08-30 DIAGNOSIS — E875 Hyperkalemia: Secondary | ICD-10-CM | POA: Diagnosis not present

## 2014-08-30 DIAGNOSIS — E785 Hyperlipidemia, unspecified: Secondary | ICD-10-CM | POA: Diagnosis present

## 2014-08-30 DIAGNOSIS — Z794 Long term (current) use of insulin: Secondary | ICD-10-CM

## 2014-08-30 DIAGNOSIS — J441 Chronic obstructive pulmonary disease with (acute) exacerbation: Secondary | ICD-10-CM | POA: Diagnosis present

## 2014-08-30 DIAGNOSIS — M545 Low back pain: Secondary | ICD-10-CM | POA: Diagnosis present

## 2014-08-30 DIAGNOSIS — Z7982 Long term (current) use of aspirin: Secondary | ICD-10-CM

## 2014-08-30 DIAGNOSIS — D869 Sarcoidosis, unspecified: Secondary | ICD-10-CM | POA: Diagnosis present

## 2014-08-30 DIAGNOSIS — J069 Acute upper respiratory infection, unspecified: Secondary | ICD-10-CM | POA: Diagnosis present

## 2014-08-30 DIAGNOSIS — Z66 Do not resuscitate: Secondary | ICD-10-CM | POA: Diagnosis present

## 2014-08-30 DIAGNOSIS — E1129 Type 2 diabetes mellitus with other diabetic kidney complication: Secondary | ICD-10-CM | POA: Diagnosis present

## 2014-08-30 DIAGNOSIS — Z992 Dependence on renal dialysis: Secondary | ICD-10-CM | POA: Diagnosis not present

## 2014-08-30 DIAGNOSIS — N179 Acute kidney failure, unspecified: Secondary | ICD-10-CM

## 2014-08-30 DIAGNOSIS — G8929 Other chronic pain: Secondary | ICD-10-CM | POA: Diagnosis present

## 2014-08-30 DIAGNOSIS — I129 Hypertensive chronic kidney disease with stage 1 through stage 4 chronic kidney disease, or unspecified chronic kidney disease: Secondary | ICD-10-CM | POA: Diagnosis present

## 2014-08-30 DIAGNOSIS — K219 Gastro-esophageal reflux disease without esophagitis: Secondary | ICD-10-CM | POA: Diagnosis present

## 2014-08-30 DIAGNOSIS — D509 Iron deficiency anemia, unspecified: Secondary | ICD-10-CM | POA: Diagnosis present

## 2014-08-30 DIAGNOSIS — K589 Irritable bowel syndrome without diarrhea: Secondary | ICD-10-CM | POA: Diagnosis present

## 2014-08-30 DIAGNOSIS — E872 Acidosis: Secondary | ICD-10-CM | POA: Diagnosis present

## 2014-08-30 DIAGNOSIS — T380X5A Adverse effect of glucocorticoids and synthetic analogues, initial encounter: Secondary | ICD-10-CM | POA: Diagnosis present

## 2014-08-30 DIAGNOSIS — I2510 Atherosclerotic heart disease of native coronary artery without angina pectoris: Secondary | ICD-10-CM | POA: Diagnosis present

## 2014-08-30 DIAGNOSIS — R0602 Shortness of breath: Secondary | ICD-10-CM | POA: Diagnosis present

## 2014-08-30 DIAGNOSIS — G4733 Obstructive sleep apnea (adult) (pediatric): Secondary | ICD-10-CM | POA: Diagnosis present

## 2014-08-30 DIAGNOSIS — J9611 Chronic respiratory failure with hypoxia: Secondary | ICD-10-CM | POA: Diagnosis present

## 2014-08-30 DIAGNOSIS — Z7952 Long term (current) use of systemic steroids: Secondary | ICD-10-CM | POA: Diagnosis not present

## 2014-08-30 DIAGNOSIS — Z9981 Dependence on supplemental oxygen: Secondary | ICD-10-CM | POA: Diagnosis not present

## 2014-08-30 DIAGNOSIS — N2581 Secondary hyperparathyroidism of renal origin: Secondary | ICD-10-CM | POA: Diagnosis present

## 2014-08-30 DIAGNOSIS — E1122 Type 2 diabetes mellitus with diabetic chronic kidney disease: Secondary | ICD-10-CM | POA: Diagnosis present

## 2014-08-30 DIAGNOSIS — N184 Chronic kidney disease, stage 4 (severe): Secondary | ICD-10-CM | POA: Diagnosis present

## 2014-08-30 DIAGNOSIS — I509 Heart failure, unspecified: Secondary | ICD-10-CM

## 2014-08-30 DIAGNOSIS — J9621 Acute and chronic respiratory failure with hypoxia: Secondary | ICD-10-CM | POA: Diagnosis present

## 2014-08-30 DIAGNOSIS — Z96653 Presence of artificial knee joint, bilateral: Secondary | ICD-10-CM | POA: Diagnosis present

## 2014-08-30 DIAGNOSIS — J449 Chronic obstructive pulmonary disease, unspecified: Secondary | ICD-10-CM | POA: Diagnosis present

## 2014-08-30 DIAGNOSIS — I272 Other secondary pulmonary hypertension: Secondary | ICD-10-CM | POA: Diagnosis present

## 2014-08-30 DIAGNOSIS — I5033 Acute on chronic diastolic (congestive) heart failure: Secondary | ICD-10-CM | POA: Diagnosis present

## 2014-08-30 DIAGNOSIS — E11649 Type 2 diabetes mellitus with hypoglycemia without coma: Secondary | ICD-10-CM | POA: Diagnosis present

## 2014-08-30 DIAGNOSIS — Z8639 Personal history of other endocrine, nutritional and metabolic disease: Secondary | ICD-10-CM

## 2014-08-30 DIAGNOSIS — I10 Essential (primary) hypertension: Secondary | ICD-10-CM | POA: Diagnosis present

## 2014-08-30 DIAGNOSIS — M549 Dorsalgia, unspecified: Secondary | ICD-10-CM

## 2014-08-30 LAB — BASIC METABOLIC PANEL
Anion gap: 6 (ref 5–15)
BUN: 62 mg/dL — AB (ref 6–23)
CHLORIDE: 109 mmol/L (ref 96–112)
CO2: 20 mmol/L (ref 19–32)
Calcium: 9.2 mg/dL (ref 8.4–10.5)
Creatinine, Ser: 3.58 mg/dL — ABNORMAL HIGH (ref 0.50–1.10)
GFR calc Af Amer: 14 mL/min — ABNORMAL LOW (ref >90)
GFR calc non Af Amer: 12 mL/min — ABNORMAL LOW (ref >90)
Glucose, Bld: 140 mg/dL — ABNORMAL HIGH (ref 70–99)
Potassium: 4.9 mmol/L (ref 3.5–5.1)
Sodium: 135 mmol/L (ref 135–145)

## 2014-08-30 LAB — URINALYSIS, ROUTINE W REFLEX MICROSCOPIC
BILIRUBIN URINE: NEGATIVE
Glucose, UA: NEGATIVE mg/dL
HGB URINE DIPSTICK: NEGATIVE
KETONES UR: NEGATIVE mg/dL
Nitrite: NEGATIVE
Specific Gravity, Urine: 1.016 (ref 1.005–1.030)
UROBILINOGEN UA: 0.2 mg/dL (ref 0.0–1.0)
pH: 5 (ref 5.0–8.0)

## 2014-08-30 LAB — CBC
HCT: 34.5 % — ABNORMAL LOW (ref 36.0–46.0)
Hemoglobin: 10.9 g/dL — ABNORMAL LOW (ref 12.0–15.0)
MCH: 28.4 pg (ref 26.0–34.0)
MCHC: 31.6 g/dL (ref 30.0–36.0)
MCV: 89.8 fL (ref 78.0–100.0)
PLATELETS: 493 10*3/uL — AB (ref 150–400)
RBC: 3.84 MIL/uL — ABNORMAL LOW (ref 3.87–5.11)
RDW: 17.2 % — AB (ref 11.5–15.5)
WBC: 10.1 10*3/uL (ref 4.0–10.5)

## 2014-08-30 LAB — PROTIME-INR
INR: 1.07 (ref 0.00–1.49)
Prothrombin Time: 14 seconds (ref 11.6–15.2)

## 2014-08-30 LAB — MAGNESIUM: MAGNESIUM: 2.1 mg/dL (ref 1.5–2.5)

## 2014-08-30 LAB — URINE MICROSCOPIC-ADD ON

## 2014-08-30 LAB — I-STAT TROPONIN, ED: TROPONIN I, POC: 0.03 ng/mL (ref 0.00–0.08)

## 2014-08-30 LAB — GLUCOSE, CAPILLARY: Glucose-Capillary: 252 mg/dL — ABNORMAL HIGH (ref 70–99)

## 2014-08-30 LAB — TROPONIN I: TROPONIN I: 0.05 ng/mL — AB (ref <0.031)

## 2014-08-30 LAB — APTT: APTT: 33 s (ref 24–37)

## 2014-08-30 LAB — BRAIN NATRIURETIC PEPTIDE: B NATRIURETIC PEPTIDE 5: 804.7 pg/mL — AB (ref 0.0–100.0)

## 2014-08-30 MED ORDER — LINACLOTIDE 290 MCG PO CAPS
290.0000 ug | ORAL_CAPSULE | ORAL | Status: DC
  Administered 2014-08-31 – 2014-09-08 (×5): 290 ug via ORAL
  Filled 2014-08-30 (×6): qty 1

## 2014-08-30 MED ORDER — FLUTICASONE PROPIONATE 50 MCG/ACT NA SUSP
2.0000 | Freq: Two times a day (BID) | NASAL | Status: DC | PRN
  Filled 2014-08-30: qty 16

## 2014-08-30 MED ORDER — SODIUM CHLORIDE 0.9 % IV SOLN: 250.0000 mL | INTRAVENOUS | Status: DC | PRN

## 2014-08-30 MED ORDER — KETOROLAC TROMETHAMINE 0.5 % OP SOLN
1.0000 [drp] | Freq: Two times a day (BID) | OPHTHALMIC | Status: DC
  Administered 2014-08-30 – 2014-09-09 (×19): 1 [drp] via OPHTHALMIC
  Filled 2014-08-30: qty 3

## 2014-08-30 MED ORDER — LORATADINE 10 MG PO TABS
10.0000 mg | ORAL_TABLET | Freq: Two times a day (BID) | ORAL | Status: DC
  Administered 2014-08-30 – 2014-09-09 (×20): 10 mg via ORAL
  Filled 2014-08-30 (×22): qty 1

## 2014-08-30 MED ORDER — TRAMADOL HCL 50 MG PO TABS: 50.0000 mg | ORAL_TABLET | Freq: Two times a day (BID) | ORAL | Status: DC | PRN

## 2014-08-30 MED ORDER — ADULT MULTIVITAMIN W/MINERALS CH
1.0000 | ORAL_TABLET | Freq: Every day | ORAL | Status: DC
  Administered 2014-08-31 – 2014-09-09 (×10): 1 via ORAL
  Filled 2014-08-30 (×12): qty 1

## 2014-08-30 MED ORDER — INSULIN DETEMIR 100 UNIT/ML FLEXPEN: 40.0000 [IU] | PEN_INJECTOR | Freq: Two times a day (BID) | SUBCUTANEOUS | Status: DC

## 2014-08-30 MED ORDER — INSULIN DETEMIR 100 UNIT/ML ~~LOC~~ SOLN
40.0000 [IU] | Freq: Two times a day (BID) | SUBCUTANEOUS | Status: DC
  Administered 2014-08-30: 40 [IU] via SUBCUTANEOUS
  Filled 2014-08-30 (×3): qty 0.4

## 2014-08-30 MED ORDER — METHYLPREDNISOLONE SODIUM SUCC 125 MG IJ SOLR
125.0000 mg | Freq: Once | INTRAMUSCULAR | Status: AC
  Administered 2014-08-30: 125 mg via INTRAVENOUS
  Filled 2014-08-30: qty 2

## 2014-08-30 MED ORDER — LEVOFLOXACIN IN D5W 500 MG/100ML IV SOLN
500.0000 mg | INTRAVENOUS | Status: DC
  Administered 2014-08-30: 500 mg via INTRAVENOUS
  Filled 2014-08-30 (×3): qty 100

## 2014-08-30 MED ORDER — LORAZEPAM 1 MG PO TABS: 1.0000 mg | ORAL_TABLET | Freq: Every evening | ORAL | Status: DC | PRN

## 2014-08-30 MED ORDER — APREMILAST 30 MG PO TABS
30.0000 mg | ORAL_TABLET | Freq: Every day | ORAL | Status: DC
  Administered 2014-09-05 – 2014-09-09 (×5): 30 mg via ORAL
  Filled 2014-08-30 (×3): qty 1

## 2014-08-30 MED ORDER — KETOROLAC TROMETHAMINE 0.4 % OP SOLN - NO CHARGE
1.0000 [drp] | Freq: Two times a day (BID) | OPHTHALMIC | Status: DC
  Filled 2014-08-30 (×3): qty 5

## 2014-08-30 MED ORDER — IPRATROPIUM-ALBUTEROL 0.5-2.5 (3) MG/3ML IN SOLN
3.0000 mL | Freq: Once | RESPIRATORY_TRACT | Status: AC
  Administered 2014-08-30: 3 mL via RESPIRATORY_TRACT
  Filled 2014-08-30: qty 3

## 2014-08-30 MED ORDER — ATORVASTATIN CALCIUM 20 MG PO TABS
20.0000 mg | ORAL_TABLET | Freq: Every day | ORAL | Status: DC
  Administered 2014-08-30 – 2014-09-08 (×10): 20 mg via ORAL
  Filled 2014-08-30 (×11): qty 1

## 2014-08-30 MED ORDER — INSULIN LISPRO 100 UNIT/ML (KWIKPEN): 35.0000 [IU] | PEN_INJECTOR | Freq: Three times a day (TID) | SUBCUTANEOUS | Status: DC

## 2014-08-30 MED ORDER — METOPROLOL TARTRATE 12.5 MG HALF TABLET
12.5000 mg | ORAL_TABLET | Freq: Two times a day (BID) | ORAL | Status: DC
  Administered 2014-08-30 – 2014-09-09 (×19): 12.5 mg via ORAL
  Filled 2014-08-30 (×21): qty 1

## 2014-08-30 MED ORDER — GUAIFENESIN ER 600 MG PO TB12
1200.0000 mg | ORAL_TABLET | Freq: Two times a day (BID) | ORAL | Status: DC
  Administered 2014-08-30 – 2014-09-09 (×20): 1200 mg via ORAL
  Filled 2014-08-30 (×22): qty 2

## 2014-08-30 MED ORDER — ACETAMINOPHEN 325 MG PO TABS
650.0000 mg | ORAL_TABLET | ORAL | Status: DC | PRN
  Administered 2014-09-06 – 2014-09-09 (×8): 650 mg via ORAL
  Filled 2014-08-30 (×8): qty 2

## 2014-08-30 MED ORDER — METHYLPREDNISOLONE SODIUM SUCC 125 MG IJ SOLR
80.0000 mg | Freq: Three times a day (TID) | INTRAMUSCULAR | Status: DC
  Administered 2014-08-30 – 2014-09-01 (×5): 80 mg via INTRAVENOUS
  Filled 2014-08-30 (×8): qty 1.28

## 2014-08-30 MED ORDER — ONDANSETRON HCL 4 MG/2ML IJ SOLN
4.0000 mg | Freq: Four times a day (QID) | INTRAMUSCULAR | Status: DC | PRN
  Administered 2014-09-08: 4 mg via INTRAVENOUS
  Filled 2014-08-30: qty 2

## 2014-08-30 MED ORDER — PREDNISONE 5 MG PO TABS
5.0000 mg | ORAL_TABLET | Freq: Every day | ORAL | Status: DC
  Administered 2014-08-31 – 2014-09-02 (×3): 5 mg via ORAL
  Filled 2014-08-30 (×5): qty 1

## 2014-08-30 MED ORDER — IPRATROPIUM-ALBUTEROL 0.5-2.5 (3) MG/3ML IN SOLN: 3.0000 mL | RESPIRATORY_TRACT | Status: DC | PRN

## 2014-08-30 MED ORDER — INSULIN ASPART 100 UNIT/ML ~~LOC~~ SOLN
0.0000 [IU] | Freq: Three times a day (TID) | SUBCUTANEOUS | Status: DC
  Administered 2014-08-31: 11 [IU] via SUBCUTANEOUS
  Administered 2014-08-31: 15 [IU] via SUBCUTANEOUS
  Administered 2014-08-31: 20 [IU] via SUBCUTANEOUS
  Administered 2014-09-01: 11 [IU] via SUBCUTANEOUS
  Administered 2014-09-01: 15 [IU] via SUBCUTANEOUS
  Administered 2014-09-01 – 2014-09-02 (×2): 7 [IU] via SUBCUTANEOUS
  Administered 2014-09-02: 15 [IU] via SUBCUTANEOUS
  Administered 2014-09-02: 7 [IU] via SUBCUTANEOUS
  Administered 2014-09-03: 4 [IU] via SUBCUTANEOUS
  Administered 2014-09-03: 11 [IU] via SUBCUTANEOUS
  Administered 2014-09-03 – 2014-09-04 (×2): 7 [IU] via SUBCUTANEOUS
  Administered 2014-09-04: 4 [IU] via SUBCUTANEOUS
  Administered 2014-09-04 – 2014-09-05 (×3): 7 [IU] via SUBCUTANEOUS
  Administered 2014-09-06: 3 [IU] via SUBCUTANEOUS
  Administered 2014-09-06 – 2014-09-08 (×4): 4 [IU] via SUBCUTANEOUS
  Administered 2014-09-08: 11 [IU] via SUBCUTANEOUS
  Administered 2014-09-09: 7 [IU] via SUBCUTANEOUS
  Administered 2014-09-09: 3 [IU] via SUBCUTANEOUS

## 2014-08-30 MED ORDER — HYDROXYZINE HCL 25 MG PO TABS
25.0000 mg | ORAL_TABLET | ORAL | Status: DC | PRN
  Filled 2014-08-30: qty 1

## 2014-08-30 MED ORDER — FUROSEMIDE 10 MG/ML IJ SOLN
20.0000 mg | Freq: Once | INTRAMUSCULAR | Status: AC
  Administered 2014-08-30: 20 mg via INTRAVENOUS
  Filled 2014-08-30: qty 2

## 2014-08-30 MED ORDER — ASPIRIN EC 81 MG PO TBEC
81.0000 mg | DELAYED_RELEASE_TABLET | Freq: Every day | ORAL | Status: DC
  Administered 2014-08-31 – 2014-09-09 (×10): 81 mg via ORAL
  Filled 2014-08-30 (×11): qty 1

## 2014-08-30 MED ORDER — PANTOPRAZOLE SODIUM 40 MG PO TBEC
80.0000 mg | DELAYED_RELEASE_TABLET | Freq: Every day | ORAL | Status: DC
Start: 2014-08-31 — End: 2014-09-09
  Administered 2014-08-31 – 2014-09-09 (×10): 80 mg via ORAL
  Filled 2014-08-30 (×9): qty 2

## 2014-08-30 MED ORDER — DEXTROMETHORPHAN POLISTIREX 30 MG/5ML PO LQCR
30.0000 mg | Freq: Two times a day (BID) | ORAL | Status: DC | PRN
  Administered 2014-08-30 – 2014-09-08 (×6): 30 mg via ORAL
  Filled 2014-08-30 (×8): qty 5

## 2014-08-30 MED ORDER — CLONIDINE HCL 0.1 MG PO TABS
0.1000 mg | ORAL_TABLET | Freq: Two times a day (BID) | ORAL | Status: DC
  Administered 2014-08-30 – 2014-09-09 (×19): 0.1 mg via ORAL
  Filled 2014-08-30 (×22): qty 1

## 2014-08-30 MED ORDER — IPRATROPIUM-ALBUTEROL 0.5-2.5 (3) MG/3ML IN SOLN
3.0000 mL | RESPIRATORY_TRACT | Status: DC
  Administered 2014-08-30 – 2014-09-01 (×8): 3 mL via RESPIRATORY_TRACT
  Filled 2014-08-30 (×11): qty 3

## 2014-08-30 MED ORDER — FERROUS SULFATE 325 (65 FE) MG PO TABS
325.0000 mg | ORAL_TABLET | Freq: Every day | ORAL | Status: DC
  Administered 2014-08-31 – 2014-09-05 (×6): 325 mg via ORAL
  Filled 2014-08-30 (×8): qty 1

## 2014-08-30 MED ORDER — PREGABALIN 50 MG PO CAPS
100.0000 mg | ORAL_CAPSULE | Freq: Every day | ORAL | Status: DC
  Administered 2014-08-30 – 2014-09-08 (×10): 100 mg via ORAL
  Filled 2014-08-30 (×11): qty 2

## 2014-08-30 MED ORDER — HYDRALAZINE HCL 25 MG PO TABS
25.0000 mg | ORAL_TABLET | Freq: Three times a day (TID) | ORAL | Status: DC
  Administered 2014-08-30 – 2014-08-31 (×4): 25 mg via ORAL
  Filled 2014-08-30 (×7): qty 1

## 2014-08-30 MED ORDER — PSEUDOEPHEDRINE HCL ER 120 MG PO TB12
120.0000 mg | ORAL_TABLET | Freq: Two times a day (BID) | ORAL | Status: DC
  Administered 2014-08-30 – 2014-09-04 (×10): 120 mg via ORAL
  Filled 2014-08-30 (×12): qty 1

## 2014-08-30 MED ORDER — SODIUM CHLORIDE 0.9 % IJ SOLN
3.0000 mL | INTRAMUSCULAR | Status: DC | PRN
  Administered 2014-08-31: 3 mL via INTRAVENOUS
  Filled 2014-08-30: qty 3

## 2014-08-30 MED ORDER — SODIUM CHLORIDE 0.9 % IJ SOLN
3.0000 mL | Freq: Two times a day (BID) | INTRAMUSCULAR | Status: DC
  Administered 2014-08-30 – 2014-09-09 (×20): 3 mL via INTRAVENOUS

## 2014-08-30 MED ORDER — FUROSEMIDE 10 MG/ML IJ SOLN
40.0000 mg | Freq: Two times a day (BID) | INTRAMUSCULAR | Status: DC
  Administered 2014-08-31: 40 mg via INTRAVENOUS
  Filled 2014-08-30 (×3): qty 4

## 2014-08-30 MED ORDER — PANTOPRAZOLE SODIUM 40 MG PO TBEC: 40.0000 mg | DELAYED_RELEASE_TABLET | Freq: Every day | ORAL | Status: DC

## 2014-08-30 MED ORDER — HEPARIN SODIUM (PORCINE) 5000 UNIT/ML IJ SOLN
5000.0000 [IU] | Freq: Three times a day (TID) | INTRAMUSCULAR | Status: DC
  Administered 2014-08-30 – 2014-09-09 (×28): 5000 [IU] via SUBCUTANEOUS
  Filled 2014-08-30 (×36): qty 1

## 2014-08-30 NOTE — ED Notes (Signed)
88% while ambulating.

## 2014-08-30 NOTE — ED Notes (Addendum)
Pt states she woke up Monday morning with a sore throat and has progressively gotten a hacky productive cough. Generalized weakness but no fever. In on home oxygen. Has had her spleen removed as well. Pt has a fistula on the left but not a dialysis pt yet.

## 2014-08-30 NOTE — H&P (Addendum)
[facility] History and Physical  [name] TLX:[id number] DOB: Aug 24, 1945 DOA: 08/30/2014  Referring physician: Dr.Yao PCP: [name] Blackbird, MD   Chief Complaint: Cough/shortness of breath  HPI: [name] is a 69 y.o. female  Unfortunate female with complex past medical history including chronic diastolic heart failure, coronary artery disease, chronic respiratory failure with COPD on chronic O2 3 L nasal counter as needed, morbid obesity, sarcoidosis, obstructive sleep apnea on a sleep apnea machine, chronic kidney disease stage IV, hypertension, type 2 diabetes, who presents to the ED with a 4 to five-day history of productive cough of greenish and whitish sputum, sore throat, congestion, worsening shortness of breath with exertion, diffuse wheezing, generalized weakness. Patient denies any fevers, no emesis, no chest pain, no abdominal pain, no diarrhea, no constipation, no dysuria. Patient does endorse some chills, nausea. Patient denies any significant change in her chronic orthopnea or chronic paroxysmal nocturnal dyspnea. Patient did state that she is at increased oxygen requirements over the past few days especially with minimal exertion. Patient was seen in the emergency room, basic metabolic profile done at a BUN of 62 creatinine of 3.58 otherwise was within normal limits. BNP was elevated at 804.7. CBC had a hemoglobin of 10.9 platelet count of 493 otherwise was within normal limits. Chest x-ray which was done, showed cardiac enlargement with vascular congestion and possible mild interstitial edema. No infiltrates or effusions. Patient was given 20 mg of IV Lasix, some nebulizer treatments. Patient was assessed on ambulation with sats dropping to 88%. [facility] were called to admit the patient for further evaluation and management.   Review of Systems: As per history of present illness otherwise negative. Constitutional:  No weight loss, night sweats,  Fevers, chills, fatigue.  HEENT:  No headaches, Difficulty swallowing,Tooth/dental problems,Sore throat,  No sneezing, itching, ear ache, nasal congestion, post nasal drip,  Cardio-vascular:  No chest pain, Orthopnea, PND, swelling in lower extremities, anasarca, dizziness, palpitations  GI:  No heartburn, indigestion, abdominal pain, nausea, vomiting, diarrhea, change in bowel habits, loss of appetite  Resp:  No shortness of breath with exertion or at rest. No excess mucus, no productive cough, No non-productive cough, No coughing up of blood.No change in color of mucus.No wheezing.No chest wall deformity  Skin:  no rash or lesions.  GU:  no dysuria, change in color of urine, no urgency or frequency. No flank pain.  Musculoskeletal:  No joint pain or swelling. No decreased range of motion. No back pain.  Psych:  No change in mood or affect. No depression or anxiety. No memory loss.   Past Medical History  Diagnosis Date  . Essential hypertension, benign   . Coronary atherosclerosis of native coronary artery 01/04/2006    Tiny OM1 70-90% ostial stenosis, no other CAD  . COPD (chronic obstructive pulmonary disease)   . Esophageal reflux   . Dyslipidemia   . Gout   . Colon polyps     Tubular adenomatous polyps  . Spinal stenosis of lumbar region   . Bulging lumbar disc   . Chronic diastolic heart failure   . PNA (pneumonia) 2012    With pleural effusion, requiring thoracentesis  . Chronic bronchitis   . On home oxygen therapy   . OSA (obstructive sleep apnea)     Marginally compliant with CPAP  . Type II diabetes mellitus   . Arthritis   . Chronic lower back pain   . Diverticulosis   . GERD (gastroesophageal reflux disease)   .  Anxiety   . Hyperlipidemia   . Sarcoidosis of lung   . CKD (chronic kidney disease) stage 4, GFR 15-29 ml/min   . IBS (irritable bowel syndrome)   . Cataracts, both eyes   . RFXJOITG(549.8)    Past Surgical History  Procedure Laterality Date    . Vesicovaginal fistula closure w/  total abdominal hysterectomy  1992  . Bilateral total knee replacements Bilateral Rt=5/04 & Lft=1/09    by DrAlusio  . 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 Zella Richer  . Appendectomy  1957  . Tonsillectomy  1968  . Cholecystectomy  1980's  . Abdominal hysterectomy  1992  . Reduction mammaplasty Bilateral 1980's  . Cardiac catheterization  1990's  . Thoracentesis  2012  . Joint replacement    . Av fistula placement Left 12/31/2013    Procedure: ARTERIOVENOUS (AV) FISTULA CREATION;  Surgeon: Elam Dutch, MD;  Location: [place];  Service: Vascular;  Laterality: Left;  . Colonoscopy w/ biopsies and polypectomy    . Av fistula placement Left 03/18/2014    Procedure: ARTERIOVENOUS (AV) FISTULA CREATION- LEFT BRACHIOCEPHALIC ;  Surgeon: Elam Dutch, MD;  Location: [facility] OR;  Service: Vascular;  Laterality: Left;  . Ligation of arteriovenous  fistula Left 03/18/2014    Procedure: LIGATION OF ARTERIOVENOUS  FISTULA- LEFT RADIOCEPHALIC;  Surgeon: Elam Dutch, MD;  Location: [place];  Service: Vascular;  Laterality: Left;   Social History:  reports that she has never smoked. She has never used smokeless tobacco. She reports that she does not drink alcohol or use illicit drugs.  Allergies  Allergen Reactions  . Adhesive [Tape] Other (See Comments)    Blisters   . Avelox [Moxifloxacin Hcl In Nacl] Other (See Comments)    GI upset  . Codeine Other (See Comments)    "crazy"   . Guaifenesin Nausea And Vomiting    Takes Mucinex at home without issue  . Latex Swelling  . Oxycodone Nausea And Vomiting    Takes Percocet at home without issue    Family History  Problem Relation Age of Onset  . Diabetes Mother   . Heart disease Mother   . Hyperlipidemia Mother   . Varicose Veins Mother   . Breast cancer Maternal Aunt   . Heart disease Father   . Deep vein thrombosis Father   . Hyperlipidemia Father   . Heart disease Sister     before age 42  .  Cancer Sister   . Diabetes Sister   . Hyperlipidemia Sister   . Heart attack Sister   . Heart disease Brother   . Diabetes Brother   . Hyperlipidemia Brother   . Heart attack Brother   . Hyperlipidemia Sister     Prior to Admission medications   Medication Sig Start Date End Date Taking? Authorizing Provider  acetaminophen (TYLENOL) 500 MG tablet Take 1,000 mg by mouth 2 (two) times daily as needed (pain).    Yes Historical Provider, MD  albuterol (PROVENTIL HFA;VENTOLIN HFA) 108 (90 BASE) MCG/ACT inhaler Inhale 2 puffs into the lungs every 4 (four) hours as needed for wheezing or shortness of breath.    Yes Historical Provider, MD  albuterol (PROVENTIL) (2.5 MG/3ML) 0.083% nebulizer solution Take 2.5 mg by nebulization every 2 (two) hours as needed for wheezing or shortness of breath.   Yes Historical Provider, MD  Alpha-D-Galactosidase (BEANO PO) Take 1 tablet by mouth daily.   Yes Historical Provider, MD  Apremilast (OTEZLA) 30 MG  TABS Take 30 mg by mouth daily.    Yes Historical Provider, MD  aspirin EC 81 MG tablet Take 81 mg by mouth daily.   Yes Historical Provider, MD  atorvastatin (LIPITOR) 20 MG tablet Take 20 mg by mouth at bedtime.   Yes Historical Provider, MD  cloNIDine (CATAPRES) 0.1 MG tablet Take 0.1 mg by mouth 2 (two) times daily.  02/12/14  Yes Historical Provider, MD  dextromethorphan-guaiFENesin (MUCINEX DM) 30-600 MG per 12 hr tablet Take 1 tablet by mouth 2 (two) times daily.   Yes Historical Provider, MD  esomeprazole (NEXIUM) 40 MG capsule Take 40 mg by mouth 2 (two) times daily before a meal.   Yes Historical Provider, MD  ferrous sulfate 325 (65 FE) MG tablet Take 325 mg by mouth daily with breakfast.   Yes Historical Provider, MD  fluocinonide cream (LIDEX) 3.00 % Apply 1 application topically 2 (two) times daily.  08/14/14  Yes Historical Provider, MD  fluticasone (FLONASE) 50 MCG/ACT nasal spray Place 2 sprays into both nostrils 2 (two) times daily as needed  (congestion).    Yes Historical Provider, MD  Fluticasone-Salmeterol (ADVAIR) 250-50 MCG/DOSE AEPB Inhale 1 puff into the lungs 2 (two) times daily.   Yes Historical Provider, MD  hydrALAZINE (APRESOLINE) 25 MG tablet Take 1 tablet (25 mg total) by mouth 3 (three) times daily. 06/21/14  Yes Sueanne Margarita, MD  hydrOXYzine (ATARAX/VISTARIL) 25 MG tablet Take 25 mg by mouth every 3 (three) hours as needed for itching.    Yes Historical Provider, MD  Insulin Detemir (LEVEMIR) 100 UNIT/ML Pen Inject 40 Units into the skin 2 (two) times daily. 05/01/14  Yes Hosie Poisson, MD  insulin lispro (HUMALOG KWIKPEN) 100 UNIT/ML SOPN Inject 35-52 Units into the skin 3 (three) times daily. Take 35 units  (+4-12 units based on sliding scale) every morning, 40 units (+4-12 units) in the afternoon and 40 units (+4-12 units) in the evening.   Yes Historical Provider, MD  ketorolac (ACULAR LS) 0.4 % SOLN Place 1 drop into both eyes 2 (two) times daily.    Yes Historical Provider, MD  Linaclotide (LINZESS) 290 MCG CAPS capsule Take 290 mcg by mouth every other day. 06/14/13  Yes Irene Shipper, MD  LORazepam (ATIVAN) 1 MG tablet Take 1 mg by mouth at bedtime as needed for sleep.    Yes Historical Provider, MD  metoprolol succinate (TOPROL-XL) 25 MG 24 hr tablet Take 12.5 mg by mouth 2 (two) times daily.   Yes Historical Provider, MD  Multiple Vitamins-Minerals (MULTIVITAMIN WITH MINERALS) tablet Take 1 tablet by mouth daily.   Yes Historical Provider, MD  ondansetron (ZOFRAN) 4 MG tablet Take 1 tablet (4 mg total) by mouth every 6 (six) hours. Patient taking differently: Take 4 mg by mouth every 6 (six) hours as needed for nausea or vomiting.  08/10/14  Yes Pura Spice, PA-C  predniSONE (DELTASONE) 5 MG tablet Take 5 mg by mouth daily with breakfast.   Yes Historical Provider, MD  pregabalin (LYRICA) 100 MG capsule Take 100 mg by mouth at bedtime.    Yes Historical Provider, MD  Probiotic Product (PROBIOTIC PO) Take 1  capsule by mouth daily.   Yes Historical Provider, MD  torsemide (DEMADEX) 20 MG tablet Take 20 mg by mouth daily.    Yes Historical Provider, MD  traMADol (ULTRAM) 50 MG tablet Take 50 mg by mouth 2 (two) times daily as needed (pain).    Yes Historical Provider, MD  Physical Exam: Filed Vitals:   08/30/14 1738 08/30/14 1800 08/30/14 1830 08/30/14 1837  BP: 151/53 139/57 137/66   Pulse: 59 53 54   Temp:    97.8 F (36.6 C)  TempSrc:      Resp: 19 18 13    SpO2: 100% 98% 97%     Wt Readings from Last 3 Encounters:  08/10/14 94.348 kg (208 lb)  07/10/14 106.142 kg (234 lb)  06/24/14 104.327 kg (230 lb)    General:  Obese well-developed well-nourished female speaking in full sentences in no acute cardiopulmonary distress.  Eyes: PERRLA, EOMI, normal lids, irises & conjunctiva ENT: grossly normal hearing, lips & tongue Neck: no LAD, masses or thyromegaly Cardiovascular: RRR, no m/r/g. Trace to 1+ LE edema. Telemetry: SR, no arrhythmias  Respiratory: Minimal to mild expiratory wheezing. No rhonchi. No crackles noted. Distant breath sounds secondary body habitus Abdomen: soft, ntnd, positive bowel sounds, no rebound, no guarding Skin: no rash or induration seen on limited exam Musculoskeletal: grossly normal tone BUE/BLE Psychiatric: grossly normal mood and affect, speech fluent and appropriate Neurologic: Alert and oriented 3. CN 2 through 12 are grossly intact. No focal deficits.           Labs on Admission:  Basic Metabolic Panel:  Recent Labs Lab 08/30/14 1155  NA 135  K 4.9  CL 109  CO2 20  GLUCOSE 140*  BUN 62*  CREATININE 3.58*  CALCIUM 9.2   Liver Function Tests: No results for input(s): AST, ALT, ALKPHOS, BILITOT, PROT, ALBUMIN in the last 168 hours. No results for input(s): LIPASE, AMYLASE in the last 168 hours. No results for input(s): AMMONIA in the last 168 hours. CBC:  Recent Labs Lab 08/30/14 1155  WBC 10.1  HGB 10.9*  HCT 34.5*  MCV 89.8    PLT 493*   Cardiac Enzymes: No results for input(s): CKTOTAL, CKMB, CKMBINDEX, TROPONINI in the last 168 hours.  BNP (last 3 results)  Recent Labs  04/26/14 2358 06/24/14 0945  PROBNP 3725.0* 324.0*   CBG: No results for input(s): GLUCAP in the last 168 hours.  Radiological Exams on Admission: Dg Chest 2 View  08/30/2014   CLINICAL DATA:  Cough for 2 days.  Shortness of breath.  EXAM: CHEST  2 VIEW  COMPARISON:  04/27/2014 chest x-ray  FINDINGS: The heart is enlarged but stable. There is central vascular congestion and possible mild interstitial edema. No infiltrates or effusions. The bony thorax is intact.  IMPRESSION: Cardiac enlargement with vascular congestion and possible mild interstitial edema. No infiltrates or effusions.   Electronically Signed   By: Kalman Jewels M.D.   On: 08/30/2014 13:31    EKG: Independently reviewed. Low voltage. Sinus bradycardia.  Assessment/Plan Principal Problem:   Acute on chronic respiratory failure with hypoxia Active Problems:   Acute on chronic diastolic congestive heart failure   COPD with exacerbation   Obstructive sleep apnea   Essential hypertension   GERD   Sarcoidosis   Back pain, chronic   Diabetes mellitus with renal manifestation   CKD (chronic kidney disease) stage 4, GFR 15-29 ml/min   History of hypercalcemia   Chronic respiratory failure with hypoxia  #1 acute on chronic respiratory failure with hypoxia Likely multifactorial secondary to acute on chronic diastolic mild CHF exacerbation, acute COPD exacerbation, upper respiratory viral infection. Admit the patient to telemetry bed. Cycle cardiac enzymes every 6 hours 3. Patient with recent 2-D echo done in September 2015 with a normal ejection fraction and a such  will not repeat 2-D echo. Strict I's and O's. Daily weights. Sputum Gram stain and culture. Check a influenza panel PCR. Place patient on Lasix 40 mg IV every 12 hours. Continue oxygen. Continue Lopressor.  Continue hydralazine, isordil. Place on Solu-Medrol 80 mg IV every 8 hours, Mucinex, Levaquin IV, nebulizers, Claritin. Follow.  #2 acute COPD exacerbation Likely triggered by viral upper respiratory infection. Will place patient on oxygen, IV Levaquin, Mucinex, IV Solu-Medrol 80 mg IV every 8 hours. Nebulizer treatments. Follow.  #3 mild acute on chronic diastolic CHF exacerbation Patient with elevated proBNP. Chest x-ray consistent with mild volume overload. Cycle cardiac enzymes every 6 hours 3. Patient with recent 2-D echo done September 2015 and as such will not repeat. Lasix 40 mg IV12, continue home dose Lopressor, hydralazine, Isordil, cardiac medications. Follow.  #4 gastroesophageal reflux disease PPI.  #5 hypertension Continue home regimen of antihypertensive medications.  #6 diabetes mellitus Check a hemoglobin A1c. Continue home regimen of Levemir, meal coverage insulin. Place on a sliding scale insulin.  #7 chronic kidney disease stage IV Stable. Follow. Monitor with diuresis.  #8 history of hypercalcemia Stable. Continue chronic steroid therapy.  #9 obstructive sleep apnea C Pap daily at bedtime  #10 prophylaxis PPI for GI prophylaxis. Heparin for DVT prophylaxis.   Code Status: DO NOT RESUSCITATE DVT Prophylaxis: Heparin Family Communication: Updated patient. No family at bedside. Disposition Plan: Admit to telemetry.  Time spent: 12 mins  [facility] Pager [phone]

## 2014-08-30 NOTE — ED Provider Notes (Signed)
CSN: [id number]     Arrival date & time 08/30/14  1133 History   First MD Initiated Contact with Patient 08/30/14 1529     Chief Complaint  Patient presents with  . Cough  . Sore Throat  . Fatigue  . Shortness of Breath     (Consider location/radiation/quality/duration/timing/severity/associated sxs/prior Treatment) The history is provided by the patient.  [name] is a 69 y.o. female hx of HTN, CAD, COPD, CHF here with shortness of breath. Shortness of breath and cough for the last 5 days. Started out with a sore throat then had URI symptoms then had more cough and wheezing. She gets very short of breath especially when she ambulates. She is occasionally on home oxygen but has been using her oxygen more often. She also has a history of renal insufficiency and has a dialysis fistula but hasn't started dialysis yet. She was admitted several months ago for COPD exacerbation. She also has a history of CHF but denies worsening leg swelling and denies weight gain.    Past Medical History  Diagnosis Date  . Essential hypertension, benign   . Coronary atherosclerosis of native coronary artery 01/04/2006    Tiny OM1 70-90% ostial stenosis, no other CAD  . COPD (chronic obstructive pulmonary disease)   . Esophageal reflux   . Dyslipidemia   . Gout   . Colon polyps     Tubular adenomatous polyps  . Spinal stenosis of lumbar region   . Bulging lumbar disc   . Chronic diastolic heart failure   . PNA (pneumonia) 2012    With pleural effusion, requiring thoracentesis  . Chronic bronchitis   . On home oxygen therapy   . OSA (obstructive sleep apnea)     Marginally compliant with CPAP  . Type II diabetes mellitus   . Arthritis   . Chronic lower back pain   . Diverticulosis   . GERD (gastroesophageal reflux disease)   . Anxiety   . Hyperlipidemia   . Sarcoidosis of lung   . CKD (chronic kidney disease) stage 4, GFR 15-29 ml/min   . IBS (irritable bowel syndrome)   . Cataracts,  both eyes   . [id number](419.3)    Past Surgical History  Procedure Laterality Date  . Vesicovaginal fistula closure w/  total abdominal hysterectomy  1992  . Bilateral total knee replacements Bilateral Rt=5/04 & Lft=1/09    by DrAlusio  . 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 Zella Richer  . Appendectomy  1957  . Tonsillectomy  1968  . Cholecystectomy  1980's  . Abdominal hysterectomy  1992  . Reduction mammaplasty Bilateral 1980's  . Cardiac catheterization  1990's  . Thoracentesis  2012  . Joint replacement    . Av fistula placement Left 12/31/2013    Procedure: ARTERIOVENOUS (AV) FISTULA CREATION;  Surgeon: Elam Dutch, MD;  Location: [place];  Service: Vascular;  Laterality: Left;  . Colonoscopy w/ biopsies and polypectomy    . Av fistula placement Left 03/18/2014    Procedure: ARTERIOVENOUS (AV) FISTULA CREATION- LEFT BRACHIOCEPHALIC ;  Surgeon: Elam Dutch, MD;  Location: [facility] OR;  Service: Vascular;  Laterality: Left;  . Ligation of arteriovenous  fistula Left 03/18/2014    Procedure: LIGATION OF ARTERIOVENOUS  FISTULA- LEFT RADIOCEPHALIC;  Surgeon: Elam Dutch, MD;  Location: [facility] OR;  Service: Vascular;  Laterality: Left;   Family History  Problem Relation Age of Onset  . Diabetes Mother   . Heart disease Mother   .  Hyperlipidemia Mother   . Varicose Veins Mother   . Breast cancer Maternal Aunt   . Heart disease Father   . Deep vein thrombosis Father   . Hyperlipidemia Father   . Heart disease Sister     before age 90  . Cancer Sister   . Diabetes Sister   . Hyperlipidemia Sister   . Heart attack Sister   . Heart disease Brother   . Diabetes Brother   . Hyperlipidemia Brother   . Heart attack Brother   . Hyperlipidemia Sister    History  Substance Use Topics  . Smoking status: Never Smoker   . Smokeless tobacco: Never Used  . Alcohol Use: No   OB History    No data available     Review of Systems  Respiratory: Positive for cough and shortness of  breath.   All other systems reviewed and are negative.     Allergies  Adhesive; Avelox; Codeine; Guaifenesin; Latex; and Oxycodone  Home Medications   Prior to Admission medications   Medication Sig Start Date End Date Taking? Authorizing Provider  acetaminophen (TYLENOL) 500 MG tablet Take 500 mg by mouth 2 (two) times daily as needed (pain).     Historical Provider, MD  albuterol (PROVENTIL HFA;VENTOLIN HFA) 108 (90 BASE) MCG/ACT inhaler Inhale 2 puffs into the lungs every 4 (four) hours as needed for wheezing or shortness of breath.     Historical Provider, MD  albuterol (PROVENTIL) (2.5 MG/3ML) 0.083% nebulizer solution Take 2.5 mg by nebulization every 2 (two) hours as needed for wheezing or shortness of breath.    Historical Provider, MD  Alpha-D-Galactosidase (BEANO PO) Take 1 tablet by mouth daily.    Historical Provider, MD  Apremilast (OTEZLA) 30 MG TABS Take 30 mg by mouth daily.     Historical Provider, MD  aspirin EC 81 MG tablet Take 81 mg by mouth daily.    Historical Provider, MD  atorvastatin (LIPITOR) 20 MG tablet Take 20 mg by mouth at bedtime.    Historical Provider, MD  cloNIDine (CATAPRES) 0.1 MG tablet Take 0.1 mg by mouth 2 (two) times daily.  02/12/14   Historical Provider, MD  esomeprazole (NEXIUM) 40 MG capsule Take 40 mg by mouth 2 (two) times daily before a meal.    Historical Provider, MD  ferrous sulfate 325 (65 FE) MG tablet Take 325 mg by mouth daily with breakfast.    Historical Provider, MD  fluticasone (FLONASE) 50 MCG/ACT nasal spray Place 2 sprays into both nostrils at bedtime.    Historical Provider, MD  Fluticasone-Salmeterol (ADVAIR) 250-50 MCG/DOSE AEPB Inhale 1 puff into the lungs 2 (two) times daily.    Historical Provider, MD  guaiFENesin (MUCINEX) 600 MG 12 hr tablet Take 600 mg by mouth 2 (two) times daily as needed for cough or to loosen phlegm (congestion).     Historical Provider, MD  hydrALAZINE (APRESOLINE) 25 MG tablet Take 1 tablet (25 mg  total) by mouth 3 (three) times daily. 06/21/14   Sueanne Margarita, MD  hydrOXYzine (ATARAX/VISTARIL) 25 MG tablet Take 25 mg by mouth every 4 (four) hours as needed for itching.    Historical Provider, MD  Insulin Detemir (LEVEMIR) 100 UNIT/ML Pen Inject 40 Units into the skin 2 (two) times daily. 05/01/14   Hosie Poisson, MD  insulin lispro (HUMALOG KWIKPEN) 100 UNIT/ML SOPN Inject 35 Units into the skin 3 (three) times daily. Take 35 units  (+4-12 units based on sliding scale) every morning, 40  units (+4-12 units) in the afternoon and 40 units (+4-12 units) in the evening.    Historical Provider, MD  isosorbide dinitrate (ISORDIL) 20 MG tablet Take 20 mg by mouth 3 (three) times daily.    Historical Provider, MD  ketorolac (ACULAR LS) 0.4 % SOLN Place 1 drop into both eyes 2 (two) times daily.     Historical Provider, MD  Linaclotide Rolan Lipa) 290 MCG CAPS capsule Take 290 mcg by mouth every other day. 06/14/13   Irene Shipper, MD  LORazepam (ATIVAN) 1 MG tablet Take 1 mg by mouth at bedtime as needed for sleep.     Historical Provider, MD  metoprolol succinate (TOPROL-XL) 25 MG 24 hr tablet Take 12.5 mg by mouth 2 (two) times daily.    Historical Provider, MD  Multiple Vitamins-Minerals (MULTIVITAMIN WITH MINERALS) tablet Take 1 tablet by mouth daily.    Historical Provider, MD  ondansetron (ZOFRAN) 4 MG tablet Take 1 tablet (4 mg total) by mouth every 6 (six) hours. 08/10/14   Pura Spice, PA-C  predniSONE (DELTASONE) 5 MG tablet Take 5 mg by mouth daily with breakfast.    Historical Provider, MD  pregabalin (LYRICA) 100 MG capsule Take 100 mg by mouth daily.    Historical Provider, MD  Probiotic Product (PROBIOTIC PO) Take 1 capsule by mouth daily.    Historical Provider, MD  torsemide (DEMADEX) 20 MG tablet Take 40 mg by mouth daily.     Historical Provider, MD  traMADol (ULTRAM) 50 MG tablet Take 50 mg by mouth 2 (two) times daily.    Historical Provider, MD  triamcinolone ointment (KENALOG)  0.1 % Apply 1 application topically 2 (two) times daily as needed (psoriasis).    Historical Provider, MD   BP 151/53 mmHg  Pulse 59  Temp(Src) 97.4 F (36.3 C) (Oral)  Resp 19  SpO2 100% Physical Exam  Constitutional: She is oriented to person, place, and time.  Chronically ill, tachypneic   HENT:  Head: Normocephalic.  Mouth/Throat: Oropharynx is clear and moist.  Eyes: EOM are normal. Pupils are equal, round, and reactive to light.  Neck: Normal range of motion. Neck supple.  Cardiovascular: Normal rate, regular rhythm and normal heart sounds.   Pulmonary/Chest:  Slightly tachypneic, mild diffuse wheezing. No retractions  Abdominal: Soft. Bowel sounds are normal. She exhibits no distension. There is no tenderness. There is no rebound and no guarding.  Musculoskeletal: Normal range of motion.  1+ edema bilaterally   Neurological: She is alert and oriented to person, place, and time. No cranial nerve deficit. Coordination normal.  Skin: Skin is warm and dry.  Psychiatric: She has a normal mood and affect. Her behavior is normal. Judgment and thought content normal.  Nursing note and vitals reviewed.   ED Course  Procedures (including critical care time) Labs Review Labs Reviewed  BRAIN NATRIURETIC PEPTIDE - Abnormal; Notable for the following:    B Natriuretic Peptide 804.7 (*)    All other components within normal limits  BASIC METABOLIC PANEL - Abnormal; Notable for the following:    Glucose, Bld 140 (*)    BUN 62 (*)    Creatinine, Ser 3.58 (*)    GFR calc non Af Amer 12 (*)    GFR calc Af Amer 14 (*)    All other components within normal limits  CBC - Abnormal; Notable for the following:    RBC 3.84 (*)    Hemoglobin 10.9 (*)    HCT 34.5 (*)  RDW 17.2 (*)    Platelets 493 (*)    All other components within normal limits  URINALYSIS, ROUTINE W REFLEX MICROSCOPIC  I-STAT TROPOININ, ED    Imaging Review Dg Chest 2 View  08/30/2014   CLINICAL DATA:  Cough for  2 days.  Shortness of breath.  EXAM: CHEST  2 VIEW  COMPARISON:  04/27/2014 chest x-ray  FINDINGS: The heart is enlarged but stable. There is central vascular congestion and possible mild interstitial edema. No infiltrates or effusions. The bony thorax is intact.  IMPRESSION: Cardiac enlargement with vascular congestion and possible mild interstitial edema. No infiltrates or effusions.   Electronically Signed   By: Kalman Jewels M.D.   On: 08/30/2014 13:31     EKG Interpretation   Date/Time:  Friday August 30 2014 11:51:20 EST Ventricular Rate:  59 PR Interval:  158 QRS Duration: 82 QT Interval:  412 QTC Calculation: 407 R Axis:   82 Text Interpretation:  Sinus bradycardia Low voltage QRS Septal infarct ,  age undetermined Abnormal ECG No significant change since last tracing  Confirmed by Yamel Bale  MD, Jahki Witham ([id number]) on 08/30/2014 3:50:58 PM      MDM   Final diagnoses:  None   [name] is a 69 y.o. female here with shortness of breath, wheezing. I think likely COPD vs CHF exacerbation. Will get BNP, labs, CXR. Will give nebs and steroids.   5:52 PM After nebs, steroids, lasix, still short of breath. When ambulating desat to 88% and had worsening shortness of breath. BNP elevated. Will admit for COPD/ CHF.     Wandra Arthurs, MD 08/30/14 [phone]

## 2014-08-30 NOTE — Progress Notes (Signed)
Spoke with pt about wearing a CPAP tonight, and pt stated that she would rather just wear her [place].

## 2014-08-31 ENCOUNTER — Inpatient Hospital Stay (HOSPITAL_COMMUNITY): Payer: Medicare Other

## 2014-08-31 DIAGNOSIS — E1121 Type 2 diabetes mellitus with diabetic nephropathy: Secondary | ICD-10-CM

## 2014-08-31 DIAGNOSIS — Z8639 Personal history of other endocrine, nutritional and metabolic disease: Secondary | ICD-10-CM

## 2014-08-31 DIAGNOSIS — D509 Iron deficiency anemia, unspecified: Secondary | ICD-10-CM

## 2014-08-31 DIAGNOSIS — E875 Hyperkalemia: Secondary | ICD-10-CM | POA: Diagnosis not present

## 2014-08-31 LAB — BASIC METABOLIC PANEL
Anion gap: 9 (ref 5–15)
Anion gap: 8 (ref 5–15)
BUN: 70 mg/dL — AB (ref 6–23)
BUN: 76 mg/dL — ABNORMAL HIGH (ref 6–23)
CALCIUM: 8.8 mg/dL (ref 8.4–10.5)
CHLORIDE: 108 mmol/L (ref 96–112)
CO2: 17 mmol/L — ABNORMAL LOW (ref 19–32)
CO2: 21 mmol/L (ref 19–32)
Calcium: 9.2 mg/dL (ref 8.4–10.5)
Chloride: 103 mmol/L (ref 96–112)
Creatinine, Ser: 3.6 mg/dL — ABNORMAL HIGH (ref 0.50–1.10)
Creatinine, Ser: 3.56 mg/dL — ABNORMAL HIGH (ref 0.50–1.10)
GFR calc non Af Amer: 12 mL/min — ABNORMAL LOW (ref >90)
GFR calc non Af Amer: 12 mL/min — ABNORMAL LOW (ref >90)
GFR, EST AFRICAN AMERICAN: 14 mL/min — AB (ref >90)
GFR, EST AFRICAN AMERICAN: 14 mL/min — AB (ref >90)
GLUCOSE: 286 mg/dL — AB (ref 70–99)
Glucose, Bld: 302 mg/dL — ABNORMAL HIGH (ref 70–99)
Potassium: 5.9 mmol/L — ABNORMAL HIGH (ref 3.5–5.1)
Potassium: 4.7 mmol/L (ref 3.5–5.1)
SODIUM: 134 mmol/L — AB (ref 135–145)
Sodium: 132 mmol/L — ABNORMAL LOW (ref 135–145)

## 2014-08-31 LAB — RENAL FUNCTION PANEL
Albumin: 2.6 g/dL — ABNORMAL LOW (ref 3.5–5.2)
Anion gap: 9 (ref 5–15)
BUN: 73 mg/dL — AB (ref 6–23)
CALCIUM: 8.9 mg/dL (ref 8.4–10.5)
CO2: 18 mmol/L — AB (ref 19–32)
Chloride: 103 mmol/L (ref 96–112)
Creatinine, Ser: 3.73 mg/dL — ABNORMAL HIGH (ref 0.50–1.10)
GFR calc Af Amer: 14 mL/min — ABNORMAL LOW (ref >90)
GFR, EST NON AFRICAN AMERICAN: 12 mL/min — AB (ref >90)
Glucose, Bld: 369 mg/dL — ABNORMAL HIGH (ref 70–99)
POTASSIUM: 5.7 mmol/L — AB (ref 3.5–5.1)
Phosphorus: 4.1 mg/dL (ref 2.3–4.6)
SODIUM: 130 mmol/L — AB (ref 135–145)

## 2014-08-31 LAB — IRON AND TIBC
IRON: 12 ug/dL — AB (ref 42–145)
Saturation Ratios: 6 % — ABNORMAL LOW (ref 20–55)
TIBC: 211 ug/dL — ABNORMAL LOW (ref 250–470)
UIBC: 199 ug/dL (ref 125–400)

## 2014-08-31 LAB — INFLUENZA PANEL BY PCR (TYPE A & B)
H1N1 flu by pcr: NOT DETECTED
INFLAPCR: NEGATIVE
Influenza B By PCR: NEGATIVE

## 2014-08-31 LAB — GLUCOSE, CAPILLARY
GLUCOSE-CAPILLARY: 390 mg/dL — AB (ref 70–99)
GLUCOSE-CAPILLARY: 245 mg/dL — AB (ref 70–99)
Glucose-Capillary: 285 mg/dL — ABNORMAL HIGH (ref 70–99)
Glucose-Capillary: 328 mg/dL — ABNORMAL HIGH (ref 70–99)

## 2014-08-31 LAB — FERRITIN: FERRITIN: 129 ng/mL (ref 10–291)

## 2014-08-31 LAB — BRAIN NATRIURETIC PEPTIDE: B Natriuretic Peptide: 906.2 pg/mL — ABNORMAL HIGH (ref 0.0–100.0)

## 2014-08-31 LAB — TROPONIN I
TROPONIN I: 0.03 ng/mL (ref <0.031)
Troponin I: 0.04 ng/mL — ABNORMAL HIGH (ref <0.031)

## 2014-08-31 LAB — FOLATE: Folate: >20 ng/mL

## 2014-08-31 LAB — VITAMIN B12: VITAMIN B 12: 861 pg/mL (ref 211–911)

## 2014-08-31 MED ORDER — INSULIN DETEMIR 100 UNIT/ML ~~LOC~~ SOLN
44.0000 [IU] | Freq: Two times a day (BID) | SUBCUTANEOUS | Status: DC
  Administered 2014-08-31 (×2): 44 [IU] via SUBCUTANEOUS
  Filled 2014-08-31 (×5): qty 0.44

## 2014-08-31 MED ORDER — SODIUM CHLORIDE 0.9 % IV SOLN
1.0000 g | Freq: Once | INTRAVENOUS | Status: AC
  Administered 2014-08-31: 1 g via INTRAVENOUS
  Filled 2014-08-31: qty 10

## 2014-08-31 MED ORDER — SODIUM POLYSTYRENE SULFONATE 15 GM/60ML PO SUSP
45.0000 g | Freq: Once | ORAL | Status: AC
  Administered 2014-08-31: 45 g via ORAL
  Filled 2014-08-31: qty 180

## 2014-08-31 MED ORDER — SODIUM BICARBONATE 650 MG PO TABS
650.0000 mg | ORAL_TABLET | Freq: Two times a day (BID) | ORAL | Status: DC
  Administered 2014-08-31 – 2014-09-07 (×16): 650 mg via ORAL
  Filled 2014-08-31 (×18): qty 1

## 2014-08-31 MED ORDER — INSULIN ASPART 100 UNIT/ML ~~LOC~~ SOLN
10.0000 [IU] | Freq: Once | SUBCUTANEOUS | Status: AC
  Administered 2014-08-31: 10 [IU] via SUBCUTANEOUS

## 2014-08-31 MED ORDER — LEVOFLOXACIN IN D5W 250 MG/50ML IV SOLN
250.0000 mg | INTRAVENOUS | Status: DC
  Filled 2014-08-31: qty 50

## 2014-08-31 MED ORDER — DEXTROSE 50 % IV SOLN
1.0000 | Freq: Once | INTRAVENOUS | Status: DC
  Filled 2014-08-31: qty 50

## 2014-08-31 MED ORDER — CETYLPYRIDINIUM CHLORIDE 0.05 % MT LIQD
7.0000 mL | Freq: Two times a day (BID) | OROMUCOSAL | Status: DC
  Administered 2014-09-01 – 2014-09-09 (×12): 7 mL via OROMUCOSAL

## 2014-08-31 MED ORDER — FUROSEMIDE 40 MG PO TABS
40.0000 mg | ORAL_TABLET | Freq: Every day | ORAL | Status: DC
  Administered 2014-09-01: 40 mg via ORAL
  Filled 2014-08-31 (×2): qty 1

## 2014-08-31 NOTE — Progress Notes (Signed)
[facility] PROGRESS NOTE  [name] ZOX:[id number] DOB: 27-Mar-1946 DOA: 08/30/2014 PCP: Antony Blackbird, MD  Assessment/Plan: #1 acute on chronic respiratory failure Likely multifactorial secondary to acute COPD exacerbation and mild CHF exacerbation. I/O equal -260. Patient states she's not feeling much better however seems to have clinically improved. Repeat chest x-ray negative for pulmonary edema. Influenza panel is negative. Patient still with some minimal to mild expiratory wheezing and poor to fair air movement. Change IV Lasix to oral Lasix. Continue IV steroid taper, IV antibiotics, Mucinex, schedule nebulizer treatments, Claritin. Follow.  #2 probable acute COPD exacerbation Likely triggered by upper respiratory viral infection. Patient still with some wheezing noted on exam. Patient with poor to fair air movement. Unable to assess patient's baseline however states she's worse than her baseline. Continue IV steroids, schedule nebulizers, IV Levaquin, Mucinex, Claritin. Place on a flutter valve. Follow.  #3 mild acute on chronic diastolic CHF exacerbation Some clinical improvement. I/O equal negative to 60. Cardiac enzymes minimally elevated. Patient denies any active chest pain. Patient would recent 2-D echo done 04/27/2014 with an ejection fraction of 55-60% with no wall nausea and abnormalities. Will not repeat 2-D echo. Change IV Lasix to oral Lasix. Continue aspirin, Lipitor, hydralazine, Lopressor.  #4 metabolic acidosis Likely secondary to chronic kidney disease. Placed on bicarbonate tablets. Follow.  #5 hypertension  Continue current home regimen of antihypertensive medications.  #6 chronic kidney disease stage IV Stable. Outpatient follow-up.  #7 history of hypercalcemia Continue chronic steroid therapy.  #8 obstructive sleep apnea C Pap daily at bedtime.  #9 diabetes mellitus Hemoglobin A1c pending. CBGs have ranged from 250-285. Increase Levemir to 44  units twice a day. Continue sliding scale insulin. Follow.  #10 hyperkalemia Repeat be met. If still hyperkalemic will get a EKG, place on calcium gluconate, insulin, D50 and Kayexalate. Follow.  #11 prophylaxis PPI for GI prophylaxis. Heparin for DVT prophylaxis.   Code Status: DO NOT RESUSCITATE Family Communication: Updated patient at bedside. No family present. Disposition Plan: Remain inpatient.   Consultants:  None  Procedures:  Chest x-ray 08/30/2014, 08/31/2014  Antibiotics:  IV Levaquin 08/30/2014  HPI/Subjective: Patient still with complaints of shortness of breath. Patient states she's not feeling better. Patient denies any chest pain.  Objective: Filed Vitals:   08/31/14 0607  BP: 164/68  Pulse: 53  Temp: 97.5 F (36.4 C)  Resp: 20    Intake/Output Summary (Last 24 hours) at 08/31/14 1054 Last data filed at 08/31/14 0730  Gross per 24 hour  Intake    480 ml  Output    850 ml  Net   -370 ml   Filed Weights   08/30/14 1925 08/31/14 0621  Weight: 100.336 kg (221 lb 3.2 oz) 99.5 kg (219 lb 5.7 oz)    Exam:   General:  NAD  Cardiovascular: RRR  Respiratory: Minimal to mild expiratory wheezing. No crackles noted. Poor to fair air movement.  Abdomen: Soft, nontender, nondistended, positive bowel sounds.  Musculoskeletal: No clubbing or cyanosis. Trace bilateral lower extremity edema.  Data Reviewed: Basic Metabolic Panel:  Recent Labs Lab 08/30/14 1155 08/30/14 2104 08/31/14 0125 08/31/14 0840  NA 135  --  134* 130*  K 4.9  --  5.9* 5.7*  CL 109  --  108 103  CO2 20  --  17* 18*  GLUCOSE 140*  --  302* 369*  BUN 62*  --  70* 73*  CREATININE 3.58*  --  3.60* 3.73*  CALCIUM 9.2  --  8.8 8.9  MG  --  2.1  --   --   PHOS  --   --   --  4.1   Liver Function Tests:  Recent Labs Lab 08/31/14 0840  ALBUMIN 2.6*   No results for input(s): LIPASE, AMYLASE in the last 168 hours. No results for input(s): AMMONIA in the last 168  hours. CBC:  Recent Labs Lab 08/30/14 1155  WBC 10.1  HGB 10.9*  HCT 34.5*  MCV 89.8  PLT 493*   Cardiac Enzymes:  Recent Labs Lab 08/30/14 2104 08/31/14 0125 08/31/14 0840  TROPONINI 0.05* 0.03 0.04*   BNP (last 3 results)  Recent Labs  04/26/14 2358 06/24/14 0945  PROBNP 3725.0* 324.0*   CBG:  Recent Labs Lab 08/30/14 2153 08/31/14 0605  GLUCAP 252* 285*    No results found for this or any previous visit (from the past 240 hour(s)).   Studies: Dg Chest 2 View  08/31/2014   CLINICAL DATA:  Acute onset of shortness of breath. Subsequent encounter.  EXAM: CHEST  2 VIEW  COMPARISON:  Chest radiograph performed 08/30/2014  FINDINGS: The lungs are well-aerated. Mild bilateral atelectasis is noted. There is no evidence of pleural effusion or pneumothorax.  The heart is mildly enlarged. No acute osseous abnormalities are seen.  IMPRESSION: Mild bilateral atelectasis noted.  Mild cardiomegaly seen.   Electronically Signed   By: Garald Balding M.D.   On: 08/31/2014 08:51   Dg Chest 2 View  08/30/2014   CLINICAL DATA:  Cough for 2 days.  Shortness of breath.  EXAM: CHEST  2 VIEW  COMPARISON:  04/27/2014 chest x-ray  FINDINGS: The heart is enlarged but stable. There is central vascular congestion and possible mild interstitial edema. No infiltrates or effusions. The bony thorax is intact.  IMPRESSION: Cardiac enlargement with vascular congestion and possible mild interstitial edema. No infiltrates or effusions.   Electronically Signed   By: Kalman Jewels M.D.   On: 08/30/2014 13:31    Scheduled Meds: . antiseptic oral rinse  7 mL Mouth Rinse BID  . Apremilast  30 mg Oral Daily  . aspirin EC  81 mg Oral Daily  . atorvastatin  20 mg Oral QHS  . cloNIDine  0.1 mg Oral BID  . ferrous sulfate  325 mg Oral Q breakfast  . furosemide  40 mg Intravenous Q12H  . guaiFENesin  1,200 mg Oral BID  . heparin  5,000 Units Subcutaneous 3 times per day  . hydrALAZINE  25 mg Oral TID   . insulin aspart  0-20 Units Subcutaneous TID WC  . insulin detemir  44 Units Subcutaneous BID  . ipratropium-albuterol  3 mL Nebulization Q4H  . ketorolac  1 drop Both Eyes BID  . ketorolac  1 drop Both Eyes BID  . levofloxacin (LEVAQUIN) IV  500 mg Intravenous Q24H  . Linaclotide  290 mcg Oral QODAY  . loratadine  10 mg Oral BID  . methylPREDNISolone (SOLU-MEDROL) injection  80 mg Intravenous 3 times per day  . metoprolol tartrate  12.5 mg Oral Q12H  . multivitamin with minerals  1 tablet Oral Daily  . pantoprazole  80 mg Oral Q1200  . predniSONE  5 mg Oral Q breakfast  . pregabalin  100 mg Oral QHS  . pseudoephedrine  120 mg Oral BID  . sodium bicarbonate  650 mg Oral BID  . sodium chloride  3 mL Intravenous Q12H   Continuous Infusions:   Principal Problem:   Acute on  chronic respiratory failure with hypoxia Active Problems:   Acute on chronic diastolic congestive heart failure   COPD with exacerbation   Obstructive sleep apnea   Essential hypertension   GERD   Sarcoidosis   Back pain, chronic   Diabetes mellitus with renal manifestation   CKD (chronic kidney disease) stage 4, GFR 15-29 ml/min   History of hypercalcemia   Chronic respiratory failure with hypoxia   Chronic obstructive pulmonary disease with acute exacerbation   Anemia, iron deficiency   Hyperkalemia    Time spent: 40 mins    [facility] MD [facility] Pager [phone]. If 7PM-7AM, please contact night-coverage at [email or web address], password [facility] 08/31/2014, 10:54 AM  LOS: 1 day

## 2014-09-01 DIAGNOSIS — N189 Chronic kidney disease, unspecified: Secondary | ICD-10-CM

## 2014-09-01 LAB — BRAIN NATRIURETIC PEPTIDE: B NATRIURETIC PEPTIDE 5: 1273.9 pg/mL — AB (ref 0.0–100.0)

## 2014-09-01 LAB — CBC
HCT: 36.6 % (ref 36.0–46.0)
Hemoglobin: 11.4 g/dL — ABNORMAL LOW (ref 12.0–15.0)
MCH: 27.7 pg (ref 26.0–34.0)
MCHC: 31.1 g/dL (ref 30.0–36.0)
MCV: 88.8 fL (ref 78.0–100.0)
Platelets: 517 10*3/uL — ABNORMAL HIGH (ref 150–400)
RBC: 4.12 MIL/uL (ref 3.87–5.11)
RDW: 16.4 % — ABNORMAL HIGH (ref 11.5–15.5)
WBC: 14.7 10*3/uL — AB (ref 4.0–10.5)

## 2014-09-01 LAB — BASIC METABOLIC PANEL
Anion gap: 12 (ref 5–15)
BUN: 77 mg/dL — ABNORMAL HIGH (ref 6–23)
CHLORIDE: 100 mmol/L (ref 96–112)
CO2: 18 mmol/L — ABNORMAL LOW (ref 19–32)
Calcium: 8.8 mg/dL (ref 8.4–10.5)
Creatinine, Ser: 3.4 mg/dL — ABNORMAL HIGH (ref 0.50–1.10)
GFR calc Af Amer: 15 mL/min — ABNORMAL LOW (ref >90)
GFR calc non Af Amer: 13 mL/min — ABNORMAL LOW (ref >90)
Glucose, Bld: 263 mg/dL — ABNORMAL HIGH (ref 70–99)
POTASSIUM: 4.7 mmol/L (ref 3.5–5.1)
SODIUM: 130 mmol/L — AB (ref 135–145)

## 2014-09-01 LAB — GLUCOSE, CAPILLARY
GLUCOSE-CAPILLARY: 255 mg/dL — AB (ref 70–99)
GLUCOSE-CAPILLARY: 312 mg/dL — AB (ref 70–99)
Glucose-Capillary: 241 mg/dL — ABNORMAL HIGH (ref 70–99)

## 2014-09-01 MED ORDER — LEVOFLOXACIN 250 MG PO TABS
250.0000 mg | ORAL_TABLET | Freq: Every day | ORAL | Status: DC
  Administered 2014-09-01 – 2014-09-03 (×3): 250 mg via ORAL
  Filled 2014-09-01 (×4): qty 1

## 2014-09-01 MED ORDER — METHYLPREDNISOLONE SODIUM SUCC 125 MG IJ SOLR
80.0000 mg | Freq: Two times a day (BID) | INTRAMUSCULAR | Status: DC
Start: 2014-09-01 — End: 2014-09-02
  Administered 2014-09-01 – 2014-09-02 (×3): 80 mg via INTRAVENOUS
  Filled 2014-09-01 (×4): qty 1.28

## 2014-09-01 MED ORDER — HYDRALAZINE HCL 50 MG PO TABS
50.0000 mg | ORAL_TABLET | Freq: Three times a day (TID) | ORAL | Status: DC
  Administered 2014-09-01 – 2014-09-09 (×24): 50 mg via ORAL
  Filled 2014-09-01 (×27): qty 1

## 2014-09-01 MED ORDER — IPRATROPIUM-ALBUTEROL 0.5-2.5 (3) MG/3ML IN SOLN
3.0000 mL | Freq: Two times a day (BID) | RESPIRATORY_TRACT | Status: DC
  Administered 2014-09-01 – 2014-09-05 (×8): 3 mL via RESPIRATORY_TRACT
  Filled 2014-09-01 (×8): qty 3

## 2014-09-01 MED ORDER — BENZONATATE 100 MG PO CAPS
200.0000 mg | ORAL_CAPSULE | Freq: Three times a day (TID) | ORAL | Status: DC
  Administered 2014-09-01 – 2014-09-02 (×5): 200 mg via ORAL
  Filled 2014-09-01 (×9): qty 2

## 2014-09-01 MED ORDER — INSULIN DETEMIR 100 UNIT/ML ~~LOC~~ SOLN
44.0000 [IU] | Freq: Two times a day (BID) | SUBCUTANEOUS | Status: DC
  Administered 2014-09-01 (×2): 44 [IU] via SUBCUTANEOUS
  Filled 2014-09-01 (×4): qty 0.44

## 2014-09-01 MED ORDER — FUROSEMIDE 10 MG/ML IJ SOLN
40.0000 mg | Freq: Once | INTRAMUSCULAR | Status: AC
  Administered 2014-09-01: 40 mg via INTRAVENOUS
  Filled 2014-09-01: qty 4

## 2014-09-01 MED ORDER — MENTHOL 3 MG MT LOZG
1.0000 | LOZENGE | Freq: Three times a day (TID) | OROMUCOSAL | Status: DC
  Administered 2014-09-01 – 2014-09-09 (×23): 3 mg via ORAL
  Filled 2014-09-01 (×3): qty 9

## 2014-09-01 MED ORDER — INSULIN DETEMIR 100 UNIT/ML ~~LOC~~ SOLN: 48.0000 [IU] | Freq: Two times a day (BID) | SUBCUTANEOUS | Status: DC

## 2014-09-01 NOTE — Progress Notes (Signed)
[facility] PROGRESS NOTE  [name] NLG:[id number] DOB: 09-08-1945 DOA: 08/30/2014 PCP: Antony Blackbird, MD  Assessment/Plan: #1 acute on chronic respiratory failure Likely multifactorial secondary to acute COPD exacerbation and mild CHF exacerbation. I/O equal -303. Patient states she's not feeling much better however seems to have clinically improved. Repeat chest x-ray negative for pulmonary edema. Influenza panel is negative. Patient's wheezing has improved. Continue oral Lasix. Will give a dose of IV Lasix 1. Continue IV steroid taper, IV antibiotics, Mucinex, schedule nebulizer treatments, Claritin. Follow.  #2 probable acute COPD exacerbation Likely triggered by upper respiratory viral infection. Patient with improvement with wheezing. Patient with poor to fair air movement. Unable to assess patient's baseline however states she's worse than her baseline. Taper IV steroids. Continue scheduled nebulizers, change IV Levaquin to oral Levaquin, continue Mucinex, Claritin. Add Best boy. Continue flutter valve. Follow.  #3 mild acute on chronic diastolic CHF exacerbation Some clinical improvement. I/O equal negative 303. Patient with 1 L urine output yesterday. Cardiac enzymes minimally elevated. Patient denies any active chest pain. Patient would recent 2-D echo done 04/27/2014 with an ejection fraction of 55-60% with no wall nausea and abnormalities. Will not repeat 2-D echo. Given a one-time dose of Lasix 40 mg IV 1. Continue oral Lasix. Continue aspirin, Lipitor, hydralazine, Lopressor.  #4 metabolic acidosis Likely secondary to chronic kidney disease. Continue bicarbonate tablets. Follow.  #5 hypertension  Continue current home regimen of antihypertensive medications.  #6 chronic kidney disease stage IV Stable. Outpatient follow-up.  #7 history of hypercalcemia Continue chronic steroid therapy.  #8 obstructive sleep apnea C Pap daily at bedtime.  #9 diabetes  mellitus Hemoglobin A1c pending. CBGs have ranged from 241-328. Continue Levemir to 44 units twice a day. Follow closely with steroid taper. Continue sliding scale insulin. Follow.  #10 hyperkalemia Resolved. Patient status post calcium gluconate, insulin, D50 and Kayexalate. Diet has been changed to a renal diet. Follow.  #11 prophylaxis PPI for GI prophylaxis. Heparin for DVT prophylaxis.   Code Status: DO NOT RESUSCITATE Family Communication: Updated patient at bedside. No family present. Disposition Plan: Remain inpatient.   Consultants:  None  Procedures:  Chest x-ray 08/30/2014, 08/31/2014  Antibiotics:  IV Levaquin 08/30/2014  HPI/Subjective: Patient denies any chest pain. Patient states still with sore throat and cough. Patient states shortness of breath has improved since admission. Patient complaining of generalized weakness.  Objective: Filed Vitals:   09/01/14 0442  BP: 160/65  Pulse: 64  Temp: 97.3 F (36.3 C)  Resp: 20    Intake/Output Summary (Last 24 hours) at 09/01/14 1051 Last data filed at 09/01/14 0730  Gross per 24 hour  Intake    960 ml  Output   1104 ml  Net   -144 ml   Filed Weights   08/30/14 1925 08/31/14 0621 09/01/14 0500  Weight: 100.336 kg (221 lb 3.2 oz) 99.5 kg (219 lb 5.7 oz) 100.835 kg (222 lb 4.8 oz)    Exam:   General:  NAD  Cardiovascular: RRR  Respiratory: CTAB. Poor to fair air movement.  Abdomen: Soft, nontender, nondistended, positive bowel sounds.  Musculoskeletal: No clubbing or cyanosis. Trace-1 + bilateral lower extremity edema.  Data Reviewed: Basic Metabolic Panel:  Recent Labs Lab 08/30/14 1155 08/30/14 2104 08/31/14 0125 08/31/14 0840 08/31/14 1907 09/01/14 0326  NA 135  --  134* 130* 132* 130*  K 4.9  --  5.9* 5.7* 4.7 4.7  CL 109  --  108 103 103 100  CO2 20  --  17* 18* 21 18*  GLUCOSE 140*  --  302* 369* 286* 263*  BUN 62*  --  70* 73* 76* 77*  CREATININE 3.58*  --  3.60* 3.73* 3.56*  3.40*  CALCIUM 9.2  --  8.8 8.9 9.2 8.8  MG  --  2.1  --   --   --   --   PHOS  --   --   --  4.1  --   --    Liver Function Tests:  Recent Labs Lab 08/31/14 0840  ALBUMIN 2.6*   No results for input(s): LIPASE, AMYLASE in the last 168 hours. No results for input(s): AMMONIA in the last 168 hours. CBC:  Recent Labs Lab 08/30/14 1155 09/01/14 0326  WBC 10.1 14.7*  HGB 10.9* 11.4*  HCT 34.5* 36.6  MCV 89.8 88.8  PLT 493* 517*   Cardiac Enzymes:  Recent Labs Lab 08/30/14 2104 08/31/14 0125 08/31/14 0840  TROPONINI 0.05* 0.03 0.04*   BNP (last 3 results)  Recent Labs  04/26/14 2358 06/24/14 0945  PROBNP 3725.0* 324.0*   CBG:  Recent Labs Lab 08/31/14 0605 08/31/14 1114 08/31/14 1521 08/31/14 2156 09/01/14 0551  GLUCAP 285* 390* 328* 245* 241*    No results found for this or any previous visit (from the past 240 hour(s)).   Studies: Dg Chest 2 View  08/31/2014   CLINICAL DATA:  Acute onset of shortness of breath. Subsequent encounter.  EXAM: CHEST  2 VIEW  COMPARISON:  Chest radiograph performed 08/30/2014  FINDINGS: The lungs are well-aerated. Mild bilateral atelectasis is noted. There is no evidence of pleural effusion or pneumothorax.  The heart is mildly enlarged. No acute osseous abnormalities are seen.  IMPRESSION: Mild bilateral atelectasis noted.  Mild cardiomegaly seen.   Electronically Signed   By: Garald Balding M.D.   On: 08/31/2014 08:51   Dg Chest 2 View  08/30/2014   CLINICAL DATA:  Cough for 2 days.  Shortness of breath.  EXAM: CHEST  2 VIEW  COMPARISON:  04/27/2014 chest x-ray  FINDINGS: The heart is enlarged but stable. There is central vascular congestion and possible mild interstitial edema. No infiltrates or effusions. The bony thorax is intact.  IMPRESSION: Cardiac enlargement with vascular congestion and possible mild interstitial edema. No infiltrates or effusions.   Electronically Signed   By: Kalman Jewels M.D.   On: 08/30/2014  13:31    Scheduled Meds: . antiseptic oral rinse  7 mL Mouth Rinse BID  . Apremilast  30 mg Oral Daily  . aspirin EC  81 mg Oral Daily  . atorvastatin  20 mg Oral QHS  . benzonatate  200 mg Oral TID  . cloNIDine  0.1 mg Oral BID  . dextrose  1 ampule Intravenous Once  . ferrous sulfate  325 mg Oral Q breakfast  . furosemide  40 mg Intravenous Once  . furosemide  40 mg Oral Daily  . guaiFENesin  1,200 mg Oral BID  . heparin  5,000 Units Subcutaneous 3 times per day  . hydrALAZINE  50 mg Oral TID  . insulin aspart  0-20 Units Subcutaneous TID WC  . insulin detemir  44 Units Subcutaneous BID  . ipratropium-albuterol  3 mL Nebulization Q4H  . ketorolac  1 drop Both Eyes BID  . ketorolac  1 drop Both Eyes BID  . levofloxacin (LEVAQUIN) IV  250 mg Intravenous Q48H  . Linaclotide  290 mcg Oral QODAY  . loratadine  10 mg Oral BID  .  menthol-cetylpyridinium  1 lozenge Oral TID  . methylPREDNISolone (SOLU-MEDROL) injection  80 mg Intravenous Q12H  . metoprolol tartrate  12.5 mg Oral Q12H  . multivitamin with minerals  1 tablet Oral Daily  . pantoprazole  80 mg Oral Q1200  . predniSONE  5 mg Oral Q breakfast  . pregabalin  100 mg Oral QHS  . pseudoephedrine  120 mg Oral BID  . sodium bicarbonate  650 mg Oral BID  . sodium chloride  3 mL Intravenous Q12H   Continuous Infusions:   Principal Problem:   Acute on chronic respiratory failure with hypoxia Active Problems:   Acute on chronic diastolic congestive heart failure   COPD with exacerbation   Obstructive sleep apnea   Essential hypertension   GERD   Sarcoidosis   Back pain, chronic   Diabetes mellitus with renal manifestation   CKD (chronic kidney disease) stage 4, GFR 15-29 ml/min   History of hypercalcemia   Chronic respiratory failure with hypoxia   Chronic obstructive pulmonary disease with acute exacerbation   Anemia, iron deficiency   Hyperkalemia    Time spent: 40 mins    [facility] MD [facility] Pager [phone]. If 7PM-7AM, please contact night-coverage at [email or web address], password [facility] 09/01/2014, 10:51 AM  LOS: 2 days

## 2014-09-01 NOTE — Progress Notes (Signed)
Utilization review completed.  

## 2014-09-01 NOTE — Progress Notes (Signed)
Patient refused CPAP for the night. Patient is wearing 2lpm oxygen with Sap02=95% at this time.

## 2014-09-02 ENCOUNTER — Inpatient Hospital Stay (HOSPITAL_COMMUNITY): Payer: Medicare Other

## 2014-09-02 DIAGNOSIS — E0821 Diabetes mellitus due to underlying condition with diabetic nephropathy: Secondary | ICD-10-CM

## 2014-09-02 DIAGNOSIS — I509 Heart failure, unspecified: Secondary | ICD-10-CM

## 2014-09-02 LAB — HEMOGLOBIN A1C
Hgb A1c MFr Bld: 8.3 % — ABNORMAL HIGH (ref 4.8–5.6)
Mean Plasma Glucose: 192 mg/dL

## 2014-09-02 LAB — BASIC METABOLIC PANEL
Anion gap: 9 (ref 5–15)
BUN: 90 mg/dL — AB (ref 6–23)
CO2: 21 mmol/L (ref 19–32)
Calcium: 8.6 mg/dL (ref 8.4–10.5)
Chloride: 99 mmol/L (ref 96–112)
Creatinine, Ser: 3.72 mg/dL — ABNORMAL HIGH (ref 0.50–1.10)
GFR calc Af Amer: 14 mL/min — ABNORMAL LOW (ref >90)
GFR calc non Af Amer: 12 mL/min — ABNORMAL LOW (ref >90)
Glucose, Bld: 318 mg/dL — ABNORMAL HIGH (ref 70–99)
Potassium: 4.6 mmol/L (ref 3.5–5.1)
Sodium: 129 mmol/L — ABNORMAL LOW (ref 135–145)

## 2014-09-02 LAB — CBC
HCT: 37.3 % (ref 36.0–46.0)
HEMOGLOBIN: 11.7 g/dL — AB (ref 12.0–15.0)
MCH: 28.2 pg (ref 26.0–34.0)
MCHC: 31.4 g/dL (ref 30.0–36.0)
MCV: 89.9 fL (ref 78.0–100.0)
Platelets: 474 10*3/uL — ABNORMAL HIGH (ref 150–400)
RBC: 4.15 MIL/uL (ref 3.87–5.11)
RDW: 16.6 % — ABNORMAL HIGH (ref 11.5–15.5)
WBC: 14.5 10*3/uL — ABNORMAL HIGH (ref 4.0–10.5)

## 2014-09-02 LAB — BRAIN NATRIURETIC PEPTIDE: B Natriuretic Peptide: 1159.9 pg/mL — ABNORMAL HIGH (ref 0.0–100.0)

## 2014-09-02 LAB — GLUCOSE, CAPILLARY
GLUCOSE-CAPILLARY: 273 mg/dL — AB (ref 70–99)
Glucose-Capillary: 301 mg/dL — ABNORMAL HIGH (ref 70–99)
Glucose-Capillary: 243 mg/dL — ABNORMAL HIGH (ref 70–99)
Glucose-Capillary: 336 mg/dL — ABNORMAL HIGH (ref 70–99)
Glucose-Capillary: 340 mg/dL — ABNORMAL HIGH (ref 70–99)

## 2014-09-02 MED ORDER — METHYLPREDNISOLONE SODIUM SUCC 125 MG IJ SOLR
80.0000 mg | Freq: Every day | INTRAMUSCULAR | Status: DC
  Filled 2014-09-02: qty 1.28

## 2014-09-02 MED ORDER — FUROSEMIDE 10 MG/ML IJ SOLN
40.0000 mg | Freq: Two times a day (BID) | INTRAMUSCULAR | Status: DC
  Administered 2014-09-02 (×3): 40 mg via INTRAVENOUS
  Filled 2014-09-02 (×4): qty 4

## 2014-09-02 MED ORDER — INSULIN DETEMIR 100 UNIT/ML ~~LOC~~ SOLN
48.0000 [IU] | Freq: Two times a day (BID) | SUBCUTANEOUS | Status: DC
  Administered 2014-09-02 – 2014-09-05 (×7): 48 [IU] via SUBCUTANEOUS
  Filled 2014-09-02 (×9): qty 0.48

## 2014-09-02 NOTE — Care Management Note (Signed)
    Page 1 of 1   09/09/2014     4:21:39 PM CARE MANAGEMENT NOTE 09/09/2014  Patient:  [name], [name]   Account Number:  [id number]  Date Initiated:  09/02/2014  Documentation initiated by:  Marvetta Gibbons  Subjective/Objective Assessment:   Pt admitted with acute on chronic resp. failure     Action/Plan:   PTA pt lived at home   Anticipated DC Date:  09/09/2014   Anticipated DC Plan:  [place]  CM consult      Choice offered to / List presented to:             Status of service:  Completed, signed off Medicare Important Message given?  YES (If response is "NO", the following Medicare IM given date fields will be blank) Date Medicare IM given:  09/02/2014 Medicare IM given by:  Marvetta Gibbons Date Additional Medicare IM given:  09/09/2014 Additional Medicare IM given by:  Marvetta Gibbons  Discharge Disposition:  HOME/SELF CARE  Per UR Regulation:  Reviewed for med. necessity/level of care/duration of stay  If discussed at [place] of Stay Meetings, dates discussed:    Comments:  09/09/14- 1500- Marvetta Gibbons RN, BSN [phone] Pt for d/c home today, still refusing [place] have spoken to MD and pt's brother who both feel pt would benefit from [facility] but agree that pt has right to decline. No referral will be made at this time. [facility] Go to do [name] one time f/u call with pt as they just finished [place] with pt recently.  09/05/14- 1100- Marvetta Gibbons RN, BSN [phone] Spoke with pt again at bedside- pt still does not want any HH f/u- states again that she just finished and does not want to do [place] again at this time. Pt notes recommend- no f/u.  09/02/14- 1100- Marvetta Gibbons RN, BSN [phone] Referral for HH-HF screen- spoke with pt at bedside- per conversation pt states that she just finished with [facility] services [facility] Go) with someone following for HF also- she reports that she does not feel like she needs these services again at this time- she has [name] scale and  weighs (most days) she also still has exercises that they gave her that she reports that she plans to still do. Pt has home 02 (3L) with [employer]- also has cpap, nebulizer, RW, standard walker, w/c- No d/c needs noted at this time CM will follow

## 2014-09-02 NOTE — Progress Notes (Signed)
Medicare Important Message given? YES  (If response is "NO", the following Medicare IM given date fields will be blank)  Date Medicare IM given: 09/02/14 Medicare IM given by:  Kaleem Sartwell  

## 2014-09-02 NOTE — Evaluation (Signed)
Physical Therapy Evaluation Patient Details Name: [name] MRN: [id number] DOB: [birth year] Today's Date: 09/02/2014   History of Present Illness  Pt with past medical history including chronic diastolic heart failure, coronary artery disease, chronic respiratory failure with COPD on chronic O2 3 L nasal counter as needed, morbid obesity, sarcoidosis, obstructive sleep apnea on a sleep apnea machine, chronic kidney disease stage IV, hypertension, type 2 diabetes, who presents to the ED with a 4 to five-day history of productive cough of greenish and whitish sputum  Clinical Impression  Pt very pleasant and moving well completing ADLs in sitting here and at home. Pt reports she rides in a cart at the grocery store and the furthest she walks is into the doctors's office. Pt just completed HHPT and does not feel further therapy outside acute will be necessary, I concur. PT educated for bil LE HEP and encouraged to continue as well as need for O2 with mobility pt states she uses at night and PRN at home. Will follow acutely to maximize activity tolerance, strength and use of DME to increase independence.     Follow Up Recommendations No PT follow up (pt just completed HHPT)    Equipment Recommendations  None recommended by PT    Recommendations for Other Services       Precautions / Restrictions Precautions Precautions: Fall      Mobility  Bed Mobility               General bed mobility comments: EOB on arrival  Transfers Overall transfer level: Needs assistance   Transfers: Sit to/from Stand Sit to Stand: Supervision         General transfer comment: cues for hand placement and to control descent to surface  Ambulation/Gait Ambulation/Gait assistance: Supervision Ambulation Distance (Feet): 200 Feet Assistive device: Rolling walker (2 wheeled) Gait Pattern/deviations: Step-through pattern;Decreased stride length;Trunk flexed   Gait velocity interpretation:  Below normal speed for age/gender General Gait Details: cues for posture and position in RW with 4 standing rests during gait. pt required 2L to maintain sats in 90s  Stairs            Wheelchair Mobility    Modified Rankin (Stroke Patients Only)       Balance Overall balance assessment: Needs assistance   Sitting balance-Leahy Scale: Good       Standing balance-Leahy Scale: Fair                               Pertinent Vitals/Pain Pain Assessment: No/denies pain  sats 94-98% on 2L with gait Drops to 87% on RA with gait HR 69    Home Living Family/patient expects to be discharged to:: Private residence Living Arrangements: Alone Available Help at Discharge: Family;Available 24 hours/day Type of Home: Apartment Home Access: Level entry     Home Layout: One level Home Equipment: Tub bench;Grab bars - toilet;Walker - 2 wheels;Walker - standard;Bedside commode;Wheelchair - Psychologist, educational      Prior Function Level of Independence: Independent with assistive device(s)         Comments: uses walker all the time, oxygen at night and when active. Does her own housework with some assist from nieces     Hand Dominance   Dominant Hand: Right    Extremity/Trunk Assessment   Upper Extremity Assessment: Generalized weakness           Lower Extremity Assessment: Generalized weakness  Cervical / Trunk Assessment: Normal  Communication   Communication: No difficulties  Cognition Arousal/Alertness: Awake/alert Behavior During Therapy: WFL for tasks assessed/performed Overall Cognitive Status: Within Functional Limits for tasks assessed                      General Comments      Exercises General Exercises - Lower Extremity Long Arc Quad: AROM;Seated;Both;10 reps Hip Flexion/Marching: AROM;Seated;Both;10 reps Toe Raises: AROM;Seated;Both;10 reps Heel Raises: AROM;Seated;Both;10 reps      Assessment/Plan    PT  Assessment Patient needs continued PT services  PT Diagnosis Difficulty walking;Generalized weakness   PT Problem List Decreased strength;Decreased activity tolerance;Decreased balance;Decreased knowledge of use of DME  PT Treatment Interventions Gait training;Functional mobility training;Therapeutic exercise;Therapeutic activities;Patient/family education   PT Goals (Current goals can be found in the Care Plan section) Acute Rehab PT Goals Patient Stated Goal: take care of myself PT Goal Formulation: With patient Time For Goal Achievement: 09/16/14 Potential to Achieve Goals: Good    Frequency Min 3X/week   Barriers to discharge Decreased caregiver support      Co-evaluation               End of Session Equipment Utilized During Treatment: Oxygen Activity Tolerance: Patient tolerated treatment well Patient left: in chair;with call bell/phone within reach Nurse Communication: Mobility status         Time: 8099-8338 PT Time Calculation (min) (ACUTE ONLY): 24 min   Charges:   PT Evaluation $Initial PT Evaluation Tier I: 1 Procedure PT Treatments $Therapeutic Exercise: 8-22 mins   PT G Codes:        Melford Aase 09/02/2014, 10:10 AM  Elwyn Reach, [place]

## 2014-09-02 NOTE — Progress Notes (Signed)
Inpatient Diabetes Program Recommendations  AACE/ADA: New Consensus Statement on Inpatient Glycemic Control (2013)  Target Ranges:  Prepandial:   less than 140 mg/dL      Peak postprandial:   less than 180 mg/dL (1-2 hours)      Critically ill patients:  140 - 180 mg/dL   Reason for Assessment:  Results for [name], [name] (MRN [id number]) as of 09/02/2014 13:20  Ref. Range 09/01/2014 11:04 09/01/2014 16:10 09/01/2014 21:52 09/02/2014 06:11 09/02/2014 11:26  Glucose-Capillary Latest Range: 70-99 mg/dL 255 (H) 312 (H) 273 (H) 301 (H) 243 (H)   Diabetes history: Type 2 diabetes Outpatient Diabetes medications: Levemir 40 units bid, Humalog 35 units breakfast and 40 units with lunch and supper Current orders for Inpatient glycemic control:  Levemir 48 units bid, Novolog resistant tid with meals  May consider restarting a portion of patient's meal coverage.  Consider Novolog 8 units tid with meals.  Thanks, Adah Perl, RN, BC-ADM Inpatient Diabetes Coordinator Pager [phone]

## 2014-09-02 NOTE — Progress Notes (Signed)
[facility] PROGRESS NOTE  [name] JKD:[id number] DOB: Jul 24, 1946 DOA: 08/30/2014 PCP: Antony Blackbird, MD  Assessment/Plan: #1 acute on chronic respiratory failure Likely multifactorial secondary to acute COPD exacerbation and mild CHF exacerbation. I/O equal -261. Patient states she's not feeling much better however seems to have clinically improved. Repeat chest x-ray negative for pulmonary edema. Influenza panel is negative. Patient's wheezing has improved. Patient with 1-2+ bilateral lower extremity edema. Change oral Lasix to Lasix 40 mg IV every 12 hours. Continue IV steroid taper, IV antibiotics, Mucinex, schedule nebulizer treatments, Claritin. Follow.  #2 probable acute COPD exacerbation Likely triggered by upper respiratory viral infection. Patient with improvement with wheezing. Patient with poor to fair air movement. Unable to assess patient's baseline however states she's worse than her baseline. Taper IV steroids. Continue scheduled nebulizers, oral Levaquin, continue Mucinex, Claritin, Tessalon Perles. Continue flutter valve. Incentive spirometry. Follow.  #3 mild acute on chronic diastolic CHF exacerbation Some clinical improvement. I/O equal negative 261/24hrs. Patient with 1.1 L urine output yesterday. Cardiac enzymes minimally elevated. Patient denies any active chest pain. Patient would recent 2-D echo done 04/27/2014 with an ejection fraction of 55-60% with no wall nausea and abnormalities. Will not repeat 2-D echo. Patient with increasing proBNP. Patient now in 1-2+ bilateral lower extremity edema. Change oral Lasix to Lasix 40 mg IV every 12 hours. Repeat proBNP. Continue aspirin, Lipitor, hydralazine, Lopressor.  #4 metabolic acidosis Likely secondary to chronic kidney disease. Continue bicarbonate tablets. Follow.  #5 hypertension  Continue current home regimen of antihypertensive medications.  #6 chronic kidney disease stage IV Stable. Outpatient  follow-up.  #7 history of hypercalcemia Continue chronic steroid therapy.  #8 obstructive sleep apnea C Pap daily at bedtime.  #9 diabetes mellitus Hemoglobin A1c pending. CBGs have ranged from 255-312. Increase Levemir to 48 units twice a day. Follow closely with steroid taper. Continue sliding scale insulin. Follow.  #10 hyperkalemia Resolved. Patient status post calcium gluconate, insulin, D50 and Kayexalate. Diet has been changed to a renal diet. Follow.  #11 prophylaxis PPI for GI prophylaxis. Heparin for DVT prophylaxis.   Code Status: DO NOT RESUSCITATE Family Communication: Updated patient at bedside. No family present. Disposition Plan: Remain inpatient.   Consultants:  None  Procedures:  Chest x-ray 08/30/2014, 08/31/2014, 09/02/2014  Antibiotics:  IV Levaquin 08/30/2014>>09/01/14  Oral Levaquin 09/01/2014  HPI/Subjective: Patient denies any chest pain. Patient states still with sore throat and cough. Patient states shortness of breath has improved since admission. Patient complaining of generalized weakness.  Objective: Filed Vitals:   09/02/14 0826  BP:   Pulse: 64  Temp:   Resp: 18    Intake/Output Summary (Last 24 hours) at 09/02/14 0857 Last data filed at 09/02/14 0827  Gross per 24 hour  Intake    720 ml  Output    650 ml  Net     70 ml   Filed Weights   08/31/14 0621 09/01/14 0500 09/02/14 0616  Weight: 99.5 kg (219 lb 5.7 oz) 100.835 kg (222 lb 4.8 oz) 102.195 kg (225 lb 4.8 oz)    Exam:   General:  NAD  Cardiovascular: RRR  Respiratory: CTAB.  fair air movement.  Abdomen: Soft, nontender, nondistended, positive bowel sounds.  Musculoskeletal: No clubbing or cyanosis. 1-2 + bilateral lower extremity edema.  Data Reviewed: Basic Metabolic Panel:  Recent Labs Lab 08/30/14 2104 08/31/14 0125 08/31/14 0840 08/31/14 1907 09/01/14 0326 09/02/14 0510  NA  --  134* 130* 132* 130* 129*  K  --  5.9* 5.7* 4.7 4.7 4.6  CL  --   108 103 103 100 99  CO2  --  17* 18* 21 18* 21  GLUCOSE  --  302* 369* 286* 263* 318*  BUN  --  70* 73* 76* 77* 90*  CREATININE  --  3.60* 3.73* 3.56* 3.40* 3.72*  CALCIUM  --  8.8 8.9 9.2 8.8 8.6  MG 2.1  --   --   --   --   --   PHOS  --   --  4.1  --   --   --    Liver Function Tests:  Recent Labs Lab 08/31/14 0840  ALBUMIN 2.6*   No results for input(s): LIPASE, AMYLASE in the last 168 hours. No results for input(s): AMMONIA in the last 168 hours. CBC:  Recent Labs Lab 08/30/14 1155 09/01/14 0326 09/02/14 0510  WBC 10.1 14.7* 14.5*  HGB 10.9* 11.4* 11.7*  HCT 34.5* 36.6 37.3  MCV 89.8 88.8 89.9  PLT 493* 517* 474*   Cardiac Enzymes:  Recent Labs Lab 08/30/14 2104 08/31/14 0125 08/31/14 0840  TROPONINI 0.05* 0.03 0.04*   BNP (last 3 results)  Recent Labs  04/26/14 2358 06/24/14 0945  PROBNP 3725.0* 324.0*   CBG:  Recent Labs Lab 09/01/14 0551 09/01/14 1104 09/01/14 1610 09/01/14 2152 09/02/14 0611  GLUCAP 241* 255* 312* 273* 301*    No results found for this or any previous visit (from the past 240 hour(s)).   Studies: Dg Chest 2 View  09/02/2014   CLINICAL DATA:  69 year old female with pneumonia, shortness of breath. Medical history includes chronic bronchitis, COPD and home oxygen requirement.  EXAM: CHEST  2 VIEW  COMPARISON:  Prior chest x-ray 08/31/2014  FINDINGS: Stable cardiomegaly. The mediastinal contours are unchanged. Atherosclerotic calcification is noted in the transverse aorta. Mild patchy opacities in the bilateral lower lobes slightly greater on the left and right demonstrate no significant interval change since the recent prior chest x-ray. There is mild pulmonary vascular congestion but no overt edema. No pneumothorax or pleural effusion. Background chronic bronchitic change and hyperexpansion consistent with the clinical history of COPD. No acute osseous abnormality.  IMPRESSION: 1. No significant interval change in the appearance  of the chest compared to 08/31/2014. 2. Stable cardiomegaly and pulmonary vascular congestion without overt edema. 3. Mild bibasilar opacities slightly greater on the left than the right are again noted and may represent subsegmental atelectasis, or mild peribronchovascular infiltrate. A combination of chronic change and subsegmental atelectasis is favored. 4. Pulmonary hyperexpansion and central bronchitic changes consistent with the clinical history of COPD.   Electronically Signed   By: Jacqulynn Cadet M.D.   On: 09/02/2014 07:59    Scheduled Meds: . antiseptic oral rinse  7 mL Mouth Rinse BID  . Apremilast  30 mg Oral Daily  . aspirin EC  81 mg Oral Daily  . atorvastatin  20 mg Oral QHS  . benzonatate  200 mg Oral TID  . cloNIDine  0.1 mg Oral BID  . dextrose  1 ampule Intravenous Once  . ferrous sulfate  325 mg Oral Q breakfast  . furosemide  40 mg Oral Daily  . guaiFENesin  1,200 mg Oral BID  . heparin  5,000 Units Subcutaneous 3 times per day  . hydrALAZINE  50 mg Oral TID  . insulin aspart  0-20 Units Subcutaneous TID WC  . insulin detemir  44 Units Subcutaneous BID  . ipratropium-albuterol  3 mL Nebulization BID  .  ketorolac  1 drop Both Eyes BID  . ketorolac  1 drop Both Eyes BID  . levofloxacin  250 mg Oral Daily  . Linaclotide  290 mcg Oral QODAY  . loratadine  10 mg Oral BID  . menthol-cetylpyridinium  1 lozenge Oral TID  . methylPREDNISolone (SOLU-MEDROL) injection  80 mg Intravenous Q12H  . metoprolol tartrate  12.5 mg Oral Q12H  . multivitamin with minerals  1 tablet Oral Daily  . pantoprazole  80 mg Oral Q1200  . predniSONE  5 mg Oral Q breakfast  . pregabalin  100 mg Oral QHS  . pseudoephedrine  120 mg Oral BID  . sodium bicarbonate  650 mg Oral BID  . sodium chloride  3 mL Intravenous Q12H   Continuous Infusions:   Principal Problem:   Acute on chronic respiratory failure with hypoxia Active Problems:   Acute on chronic diastolic congestive heart failure    COPD with exacerbation   Obstructive sleep apnea   Essential hypertension   GERD   Sarcoidosis   Back pain, chronic   Diabetes mellitus with renal manifestation   CKD (chronic kidney disease) stage 4, GFR 15-29 ml/min   History of hypercalcemia   Chronic respiratory failure with hypoxia   Chronic obstructive pulmonary disease with acute exacerbation   Anemia, iron deficiency   Hyperkalemia    Time spent: 40 mins    [facility] MD [facility] Pager [phone]. If 7PM-7AM, please contact night-coverage at [email or web address], password [facility] 09/02/2014, 8:57 AM  LOS: 3 days

## 2014-09-02 NOTE — Progress Notes (Signed)
Pt currently walking 200 feet with S.  Pt completing adls in room w/o assist.  Spoke with PT about pt's status and feel she has no acute OT needs at this time. Will sign off. Jinger [place], [place] [phone]

## 2014-09-02 NOTE — Progress Notes (Signed)
Patient refuses CPAP at this time.  Currently wearing 2 lpm nasal cannula.

## 2014-09-03 ENCOUNTER — Ambulatory Visit: Payer: Self-pay | Admitting: Cardiology

## 2014-09-03 LAB — BASIC METABOLIC PANEL
Anion gap: 12 (ref 5–15)
BUN: 105 mg/dL — ABNORMAL HIGH (ref 6–23)
CO2: 20 mmol/L (ref 19–32)
Calcium: 8.6 mg/dL (ref 8.4–10.5)
Chloride: 97 mmol/L (ref 96–112)
Creatinine, Ser: 4.23 mg/dL — ABNORMAL HIGH (ref 0.50–1.10)
GFR calc Af Amer: 12 mL/min — ABNORMAL LOW (ref >90)
GFR calc non Af Amer: 10 mL/min — ABNORMAL LOW (ref >90)
GLUCOSE: 340 mg/dL — AB (ref 70–99)
Potassium: 4.4 mmol/L (ref 3.5–5.1)
Sodium: 129 mmol/L — ABNORMAL LOW (ref 135–145)

## 2014-09-03 LAB — CBC
HCT: 37.4 % (ref 36.0–46.0)
HEMOGLOBIN: 11.8 g/dL — AB (ref 12.0–15.0)
MCH: 28.6 pg (ref 26.0–34.0)
MCHC: 31.6 g/dL (ref 30.0–36.0)
MCV: 90.8 fL (ref 78.0–100.0)
Platelets: 472 10*3/uL — ABNORMAL HIGH (ref 150–400)
RBC: 4.12 MIL/uL (ref 3.87–5.11)
RDW: 16.3 % — AB (ref 11.5–15.5)
WBC: 13 10*3/uL — AB (ref 4.0–10.5)

## 2014-09-03 LAB — GLUCOSE, CAPILLARY
GLUCOSE-CAPILLARY: 223 mg/dL — AB (ref 70–99)
GLUCOSE-CAPILLARY: 209 mg/dL — AB (ref 70–99)
Glucose-Capillary: 286 mg/dL — ABNORMAL HIGH (ref 70–99)
Glucose-Capillary: 161 mg/dL — ABNORMAL HIGH (ref 70–99)

## 2014-09-03 LAB — BRAIN NATRIURETIC PEPTIDE: B NATRIURETIC PEPTIDE 5: 1107.3 pg/mL — AB (ref 0.0–100.0)

## 2014-09-03 MED ORDER — TORSEMIDE 20 MG PO TABS
20.0000 mg | ORAL_TABLET | Freq: Every day | ORAL | Status: DC
  Administered 2014-09-03: 20 mg via ORAL
  Filled 2014-09-03 (×2): qty 1

## 2014-09-03 MED ORDER — INSULIN ASPART 100 UNIT/ML ~~LOC~~ SOLN
8.0000 [IU] | Freq: Three times a day (TID) | SUBCUTANEOUS | Status: DC
  Administered 2014-09-03 – 2014-09-05 (×7): 8 [IU] via SUBCUTANEOUS

## 2014-09-03 MED ORDER — LEVOFLOXACIN 250 MG PO TABS
250.0000 mg | ORAL_TABLET | ORAL | Status: DC
  Filled 2014-09-03: qty 1

## 2014-09-03 MED ORDER — PREDNISONE 50 MG PO TABS
60.0000 mg | ORAL_TABLET | Freq: Every day | ORAL | Status: DC
  Administered 2014-09-03 – 2014-09-04 (×2): 60 mg via ORAL
  Filled 2014-09-03 (×3): qty 1

## 2014-09-03 NOTE — Progress Notes (Signed)
[facility] PROGRESS NOTE  [name] NTI:[id number] DOB: Apr 06, 1946 DOA: 08/30/2014 PCP: Antony Blackbird, MD  Assessment/Plan: #1 acute on chronic respiratory failure Likely multifactorial secondary to acute COPD exacerbation and mild CHF exacerbation. I/O equal -564. Patient states she's not feeling much better however seems to have clinically improved. Repeat chest x-ray negative for pulmonary edema. Influenza panel is negative. Patient's wheezing has improved. Patient with 1-2+ bilateral lower extremity edema. Change IV Lasix to oral Lasix to start tomorrow secondary to increased creatinine.  Change IV steroids to oral prednisone and taper down to home regimen of 5 mg daily. Continue oral antibiotics. Continue Mucinex, scheduled nebulizer treatments, Claritin. Follow.  #2 probable acute COPD exacerbation Likely triggered by upper respiratory viral infection. Patient with improvement with wheezing. Patient with poor to fair air movement. Unable to assess patient's baseline however states she's worse than her baseline. Change IV steroids to oral prednisone and continue taper down to home regimen of 5 mg daily. Continue scheduled nebulizers, oral Levaquin, continue Mucinex, Claritin, Tessalon Perles. Continue flutter valve. Incentive spirometry. Follow.  #3 mild acute on chronic diastolic CHF exacerbation Some clinical improvement. I/O equal negative 261/24hrs. Patient with 1.1 L urine output yesterday. Cardiac enzymes minimally elevated. Patient denies any active chest pain. Patient would recent 2-D echo done 04/27/2014 with an ejection fraction of 55-60% with no wall nausea and abnormalities. Will not repeat 2-D echo. Patient with increasing proBNP. Patient now in 1-2+ bilateral lower extremity edema. Patient with increased creatinine. Discontinue IV Lasix. Resume oral Lasix tomorrow morning. Repeat proBNP still elevated. Continue aspirin, Lipitor, hydralazine, Lopressor.  #4 metabolic  acidosis Likely secondary to chronic kidney disease. Continue bicarbonate tablets. Follow.  #5 hypertension  Continue current home regimen of antihypertensive medications.  #6 chronic kidney disease stage IV Creatinine increased with IV Lasix. Will discontinue IV Lasix and resume home oral dose in the morning.. Outpatient follow-up.  #7 history of hypercalcemia Continue chronic steroid therapy.  #8 obstructive sleep apnea C Pap daily at bedtime.  #9 diabetes mellitus Hemoglobin A1c pending. CBGs have ranged from 255-312. Increase Levemir to 48 units twice a day. Follow closely with steroid taper. Continue sliding scale insulin. Follow.  #10 hyperkalemia Resolved. Patient status post calcium gluconate, insulin, D50 and Kayexalate. Diet has been changed to a renal diet. Follow.  #11 prophylaxis PPI for GI prophylaxis. Heparin for DVT prophylaxis.   Code Status: DO NOT RESUSCITATE Family Communication: Updated patient at bedside. No family present. Disposition Plan: Remain inpatient.   Consultants:  None  Procedures:  Chest x-ray 08/30/2014, 08/31/2014, 09/02/2014  Antibiotics:  IV Levaquin 08/30/2014>>09/01/14  Oral Levaquin 09/01/2014  HPI/Subjective: Patient denies any chest pain. Patient states still with sore throat and cough. Patient states shortness of breath has improved since admission. Patient complaining of generalized weakness.  Objective: Filed Vitals:   09/03/14 0500  BP: 156/67  Pulse: 58  Temp: 97.4 F (36.3 C)  Resp: 16    Intake/Output Summary (Last 24 hours) at 09/03/14 0931 Last data filed at 09/02/14 2224  Gross per 24 hour  Intake    483 ml  Output    400 ml  Net     83 ml   Filed Weights   09/01/14 0500 09/02/14 0616 09/03/14 0500  Weight: 100.835 kg (222 lb 4.8 oz) 102.195 kg (225 lb 4.8 oz) 102.604 kg (226 lb 3.2 oz)    Exam:   General:  NAD  Cardiovascular: RRR  Respiratory: CTAB.  fair air movement.  Abdomen:  Soft,  nontender, nondistended, positive bowel sounds.  Musculoskeletal: No clubbing or cyanosis. 1 + bilateral lower extremity edema.  Data Reviewed: Basic Metabolic Panel:  Recent Labs Lab 08/30/14 2104  08/31/14 0840 08/31/14 1907 09/01/14 0326 09/02/14 0510 09/03/14 0428  NA  --   < > 130* 132* 130* 129* 129*  K  --   < > 5.7* 4.7 4.7 4.6 4.4  CL  --   < > 103 103 100 99 97  CO2  --   < > 18* 21 18* 21 20  GLUCOSE  --   < > 369* 286* 263* 318* 340*  BUN  --   < > 73* 76* 77* 90* 105*  CREATININE  --   < > 3.73* 3.56* 3.40* 3.72* 4.23*  CALCIUM  --   < > 8.9 9.2 8.8 8.6 8.6  MG 2.1  --   --   --   --   --   --   PHOS  --   --  4.1  --   --   --   --   < > = values in this interval not displayed. Liver Function Tests:  Recent Labs Lab 08/31/14 0840  ALBUMIN 2.6*   No results for input(s): LIPASE, AMYLASE in the last 168 hours. No results for input(s): AMMONIA in the last 168 hours. CBC:  Recent Labs Lab 08/30/14 1155 09/01/14 0326 09/02/14 0510 09/03/14 0428  WBC 10.1 14.7* 14.5* 13.0*  HGB 10.9* 11.4* 11.7* 11.8*  HCT 34.5* 36.6 37.3 37.4  MCV 89.8 88.8 89.9 90.8  PLT 493* 517* 474* 472*   Cardiac Enzymes:  Recent Labs Lab 08/30/14 2104 08/31/14 0125 08/31/14 0840  TROPONINI 0.05* 0.03 0.04*   BNP (last 3 results)  Recent Labs  04/26/14 2358 06/24/14 0945  PROBNP 3725.0* 324.0*   CBG:  Recent Labs Lab 09/02/14 0611 09/02/14 1126 09/02/14 1602 09/02/14 2123 09/03/14 0606  GLUCAP 301* 243* 336* 340* 286*    No results found for this or any previous visit (from the past 240 hour(s)).   Studies: Dg Chest 2 View  09/02/2014   CLINICAL DATA:  69 year old female with pneumonia, shortness of breath. Medical history includes chronic bronchitis, COPD and home oxygen requirement.  EXAM: CHEST  2 VIEW  COMPARISON:  Prior chest x-ray 08/31/2014  FINDINGS: Stable cardiomegaly. The mediastinal contours are unchanged. Atherosclerotic calcification is noted  in the transverse aorta. Mild patchy opacities in the bilateral lower lobes slightly greater on the left and right demonstrate no significant interval change since the recent prior chest x-ray. There is mild pulmonary vascular congestion but no overt edema. No pneumothorax or pleural effusion. Background chronic bronchitic change and hyperexpansion consistent with the clinical history of COPD. No acute osseous abnormality.  IMPRESSION: 1. No significant interval change in the appearance of the chest compared to 08/31/2014. 2. Stable cardiomegaly and pulmonary vascular congestion without overt edema. 3. Mild bibasilar opacities slightly greater on the left than the right are again noted and may represent subsegmental atelectasis, or mild peribronchovascular infiltrate. A combination of chronic change and subsegmental atelectasis is favored. 4. Pulmonary hyperexpansion and central bronchitic changes consistent with the clinical history of COPD.   Electronically Signed   By: Jacqulynn Cadet M.D.   On: 09/02/2014 07:59    Scheduled Meds: . antiseptic oral rinse  7 mL Mouth Rinse BID  . Apremilast  30 mg Oral Daily  . aspirin EC  81 mg Oral Daily  . atorvastatin  20 mg Oral QHS  . benzonatate  200 mg Oral TID  . cloNIDine  0.1 mg Oral BID  . dextrose  1 ampule Intravenous Once  . ferrous sulfate  325 mg Oral Q breakfast  . guaiFENesin  1,200 mg Oral BID  . heparin  5,000 Units Subcutaneous 3 times per day  . hydrALAZINE  50 mg Oral TID  . insulin aspart  0-20 Units Subcutaneous TID WC  . insulin aspart  8 Units Subcutaneous TID WC  . insulin detemir  48 Units Subcutaneous BID  . ipratropium-albuterol  3 mL Nebulization BID  . ketorolac  1 drop Both Eyes BID  . ketorolac  1 drop Both Eyes BID  . levofloxacin  250 mg Oral Daily  . Linaclotide  290 mcg Oral QODAY  . loratadine  10 mg Oral BID  . menthol-cetylpyridinium  1 lozenge Oral TID  . metoprolol tartrate  12.5 mg Oral Q12H  . multivitamin  with minerals  1 tablet Oral Daily  . pantoprazole  80 mg Oral Q1200  . predniSONE  60 mg Oral QAC breakfast  . pregabalin  100 mg Oral QHS  . pseudoephedrine  120 mg Oral BID  . sodium bicarbonate  650 mg Oral BID  . sodium chloride  3 mL Intravenous Q12H   Continuous Infusions:   Principal Problem:   Acute on chronic respiratory failure with hypoxia Active Problems:   Acute on chronic diastolic congestive heart failure   COPD with exacerbation   Obstructive sleep apnea   Essential hypertension   GERD   Sarcoidosis   Back pain, chronic   Diabetes mellitus with renal manifestation   CKD (chronic kidney disease) stage 4, GFR 15-29 ml/min   History of hypercalcemia   Chronic respiratory failure with hypoxia   Chronic obstructive pulmonary disease with acute exacerbation   Anemia, iron deficiency   Hyperkalemia   Congestive heart disease    Time spent: 40 mins    [facility] MD [facility] Pager [phone]. If 7PM-7AM, please contact night-coverage at [email or web address], password [facility] 09/03/2014, 9:31 AM  LOS: 4 days

## 2014-09-04 LAB — GLUCOSE, CAPILLARY
Glucose-Capillary: 172 mg/dL — ABNORMAL HIGH (ref 70–99)
Glucose-Capillary: 174 mg/dL — ABNORMAL HIGH (ref 70–99)
Glucose-Capillary: 215 mg/dL — ABNORMAL HIGH (ref 70–99)
Glucose-Capillary: 248 mg/dL — ABNORMAL HIGH (ref 70–99)

## 2014-09-04 LAB — RENAL FUNCTION PANEL
ALBUMIN: 2.8 g/dL — AB (ref 3.5–5.2)
Anion gap: 12 (ref 5–15)
BUN: 121 mg/dL — ABNORMAL HIGH (ref 6–23)
CALCIUM: 8.5 mg/dL (ref 8.4–10.5)
CHLORIDE: 98 mmol/L (ref 96–112)
CO2: 20 mmol/L (ref 19–32)
CREATININE: 4.45 mg/dL — AB (ref 0.50–1.10)
GFR calc Af Amer: 11 mL/min — ABNORMAL LOW (ref >90)
GFR, EST NON AFRICAN AMERICAN: 9 mL/min — AB (ref >90)
Glucose, Bld: 186 mg/dL — ABNORMAL HIGH (ref 70–99)
Phosphorus: 6.3 mg/dL — ABNORMAL HIGH (ref 2.3–4.6)
Potassium: 4.7 mmol/L (ref 3.5–5.1)
SODIUM: 130 mmol/L — AB (ref 135–145)

## 2014-09-04 LAB — CBC
HEMATOCRIT: 36.5 % (ref 36.0–46.0)
Hemoglobin: 11.6 g/dL — ABNORMAL LOW (ref 12.0–15.0)
MCH: 27.9 pg (ref 26.0–34.0)
MCHC: 31.8 g/dL (ref 30.0–36.0)
MCV: 87.7 fL (ref 78.0–100.0)
Platelets: 427 10*3/uL — ABNORMAL HIGH (ref 150–400)
RBC: 4.16 MIL/uL (ref 3.87–5.11)
RDW: 16.5 % — ABNORMAL HIGH (ref 11.5–15.5)
WBC: 14.1 10*3/uL — AB (ref 4.0–10.5)

## 2014-09-04 MED ORDER — PREDNISONE 20 MG PO TABS: 40.0000 mg | ORAL_TABLET | Freq: Every day | ORAL | Status: DC

## 2014-09-04 MED ORDER — PREDNISONE 20 MG PO TABS
20.0000 mg | ORAL_TABLET | Freq: Every day | ORAL | Status: DC
  Administered 2014-09-05: 20 mg via ORAL
  Filled 2014-09-04 (×2): qty 1

## 2014-09-04 NOTE — Progress Notes (Signed)
Physical Therapy Treatment Patient Details Name: [name] MRN: [id number] DOB: [birth year] Today's Date: 09/04/2014    History of Present Illness Pt with past medical history including chronic diastolic heart failure, coronary artery disease, chronic respiratory failure with COPD on chronic O2 3 L nasal counter as needed, morbid obesity, sarcoidosis, obstructive sleep apnea on a sleep apnea machine, chronic kidney disease stage IV, hypertension, type 2 diabetes, who presents to the ED with a 4 to five-day history of productive cough of greenish and whitish sputum    PT Comments    Pt remains very motivated to progress mobility but requires multiple standing rest breaks due to fatigue and SOB. Borg 6/10 this session. Cont to follow per POC.   Follow Up Recommendations  No PT follow up     Equipment Recommendations  None recommended by PT    Recommendations for Other Services       Precautions / Restrictions Precautions Precautions: Fall Precaution Comments: monitor O2 sats Restrictions Weight Bearing Restrictions: No    Mobility  Bed Mobility Overal bed mobility: Modified Independent             General bed mobility comments: HOB elevated and use of handrails  Transfers Overall transfer level: Needs assistance Equipment used: Rolling walker (2 wheeled) Transfers: Sit to/from Stand Sit to Stand: Supervision         General transfer comment: min cues for hand placement   Ambulation/Gait Ambulation/Gait assistance: Min guard Ambulation Distance (Feet): 200 Feet Assistive device: Rolling walker (2 wheeled) Gait Pattern/deviations: Step-through pattern;Decreased stride length;Wide base of support;Shuffle Gait velocity: decr due to SOB Gait velocity interpretation: Below normal speed for age/gender General Gait Details: pt with LOB posteriorly; min guard to steady; multiple standing rest breaks; cues for pursed lip breathing; ambulating on 3L O2; ambulated  to bathroom; able to perform ADLs in bathroom at supervision level with incr time due to fatigue   Stairs            Wheelchair Mobility    Modified Rankin (Stroke Patients Only)       Balance Overall balance assessment: Needs assistance Sitting-balance support: Feet supported;No upper extremity supported Sitting balance-Leahy Scale: Good   Postural control: Posterior lean Standing balance support: During functional activity;Bilateral upper extremity supported Standing balance-Leahy Scale: Poor Standing balance comment: UE support at all times; pt with LOB posteriorly when satnding with RW                    Cognition Arousal/Alertness: Awake/alert Behavior During Therapy: WFL for tasks assessed/performed Overall Cognitive Status: Within Functional Limits for tasks assessed                      Exercises      General Comments General comments (skin integrity, edema, etc.): O2 sats on 3L ~92% with all mobility      Pertinent Vitals/Pain Pain Assessment: No/denies pain    Home Living                      Prior Function            PT Goals (current goals can now be found in the care plan section) Acute Rehab PT Goals Patient Stated Goal: to not have this cough PT Goal Formulation: With patient Time For Goal Achievement: 09/16/14 Potential to Achieve Goals: Good Progress towards PT goals: Progressing toward goals    Frequency  Min 3X/week    PT  Plan Current plan remains appropriate    Co-evaluation             End of Session Equipment Utilized During Treatment: Gait belt;Oxygen Activity Tolerance: Patient limited by fatigue Patient left: in bed;with call bell/phone within reach;with family/visitor present     Time: 0940-1004 PT Time Calculation (min) (ACUTE ONLY): 24 min  Charges:  $Gait Training: 8-22 mins $Therapeutic Activity: 8-22 mins                    G CodesGustavus Bryant, [place]   [phone] 09/04/2014, 11:00 AM

## 2014-09-04 NOTE — Consult Note (Addendum)
[name] is an 69 y.o. female referred by Dr Oda Cogan   Chief Complaint: Acute on CKD 4 HPI: 68yo with CKD 4 and baseline Scr in the upper 2's admitted 1/29 for SOB and cough.  Scr on admission was 3.5 but has increased to 4.45.  No documented hypotension and no nephrotoxins.  I/O's are actually matched for the hospitalization.  She denies any uremic Sxs.    Past Medical History  Diagnosis Date  . Essential hypertension, benign   . Coronary atherosclerosis of native coronary artery 01/04/2006    Tiny OM1 70-90% ostial stenosis, no other CAD  . COPD (chronic obstructive pulmonary disease)   . Esophageal reflux   . Dyslipidemia   . Gout   . Colon polyps     Tubular adenomatous polyps  . Spinal stenosis of lumbar region   . Bulging lumbar disc   . Chronic diastolic heart failure   . PNA (pneumonia) 2012    With pleural effusion, requiring thoracentesis  . Chronic bronchitis   . On home oxygen therapy   . OSA (obstructive sleep apnea)     Marginally compliant with CPAP  . Type II diabetes mellitus   . Arthritis   . Chronic lower back pain   . Diverticulosis   . GERD (gastroesophageal reflux disease)   . Anxiety   . Hyperlipidemia   . Sarcoidosis of lung   . CKD (chronic kidney disease) stage 4, GFR 15-29 ml/min   . IBS (irritable bowel syndrome)   . Cataracts, both eyes   . MVHQIONG(295.2)     Past Surgical History  Procedure Laterality Date  . Vesicovaginal fistula closure w/  total abdominal hysterectomy  1992  . Bilateral total knee replacements Bilateral Rt=5/04 & Lft=1/09    by DrAlusio  . 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 Zella Richer  . Appendectomy  1957  . Tonsillectomy  1968  . Cholecystectomy  1980's  . Abdominal hysterectomy  1992  . Reduction mammaplasty Bilateral 1980's  . Cardiac catheterization  1990's  . Thoracentesis  2012  . Joint replacement    . Av fistula placement Left 12/31/2013    Procedure: ARTERIOVENOUS (AV) FISTULA CREATION;  Surgeon:  Elam Dutch, MD;  Location: [facility];  Service: Vascular;  Laterality: Left;  . Colonoscopy w/ biopsies and polypectomy    . Av fistula placement Left 03/18/2014    Procedure: ARTERIOVENOUS (AV) FISTULA CREATION- LEFT BRACHIOCEPHALIC ;  Surgeon: Elam Dutch, MD;  Location: [facility] OR;  Service: Vascular;  Laterality: Left;  . Ligation of arteriovenous  fistula Left 03/18/2014    Procedure: LIGATION OF ARTERIOVENOUS  FISTULA- LEFT RADIOCEPHALIC;  Surgeon: Elam Dutch, MD;  Location: [facility] OR;  Service: Vascular;  Laterality: Left;    Family History  Problem Relation Age of Onset  . Diabetes Mother   . Heart disease Mother   . Hyperlipidemia Mother   . Varicose Veins Mother   . Breast cancer Maternal Aunt   . Heart disease Father   . Deep vein thrombosis Father   . Hyperlipidemia Father   . Heart disease Sister     before age 63  . Cancer Sister   . Diabetes Sister   . Hyperlipidemia Sister   . Heart attack Sister   . Heart disease Brother   . Diabetes Brother   . Hyperlipidemia Brother   . Heart attack Brother   . Hyperlipidemia Sister   No FH of renal ds  Social History:  reports that she has never smoked. She has never used smokeless tobacco. She reports that she does not drink alcohol or use illicit drugs. Lives by herself  Allergies:  Allergies  Allergen Reactions  . Adhesive [Tape] Other (See Comments)    Blisters   . Avelox [Moxifloxacin Hcl In Nacl] Other (See Comments)    GI upset  . Codeine Other (See Comments)    "crazy"   . Guaifenesin Nausea And Vomiting    Takes Mucinex at home without issue  . Latex Swelling  . Oxycodone Nausea And Vomiting    Takes Percocet at home without issue    Medications Prior to Admission  Medication Sig Dispense Refill  . acetaminophen (TYLENOL) 500 MG tablet Take 1,000 mg by mouth 2 (two) times daily as needed (pain).     Marland Kitchen albuterol (PROVENTIL HFA;VENTOLIN HFA) 108 (90 BASE) MCG/ACT inhaler Inhale 2 puffs into the lungs  every 4 (four) hours as needed for wheezing or shortness of breath.     Marland Kitchen albuterol (PROVENTIL) (2.5 MG/3ML) 0.083% nebulizer solution Take 2.5 mg by nebulization every 2 (two) hours as needed for wheezing or shortness of breath.    . Alpha-D-Galactosidase (BEANO PO) Take 1 tablet by mouth daily.    Marland Kitchen Apremilast (OTEZLA) 30 MG TABS Take 30 mg by mouth daily.     Marland Kitchen aspirin EC 81 MG tablet Take 81 mg by mouth daily.    Marland Kitchen atorvastatin (LIPITOR) 20 MG tablet Take 20 mg by mouth at bedtime.    . cloNIDine (CATAPRES) 0.1 MG tablet Take 0.1 mg by mouth 2 (two) times daily.     Marland Kitchen dextromethorphan-guaiFENesin (MUCINEX DM) 30-600 MG per 12 hr tablet Take 1 tablet by mouth 2 (two) times daily.    Marland Kitchen esomeprazole (NEXIUM) 40 MG capsule Take 40 mg by mouth 2 (two) times daily before a meal.    . ferrous sulfate 325 (65 FE) MG tablet Take 325 mg by mouth daily with breakfast.    . fluocinonide cream (LIDEX) 8.00 % Apply 1 application topically 2 (two) times daily.   1  . fluticasone (FLONASE) 50 MCG/ACT nasal spray Place 2 sprays into both nostrils 2 (two) times daily as needed (congestion).     . Fluticasone-Salmeterol (ADVAIR) 250-50 MCG/DOSE AEPB Inhale 1 puff into the lungs 2 (two) times daily.    . hydrALAZINE (APRESOLINE) 25 MG tablet Take 1 tablet (25 mg total) by mouth 3 (three) times daily. 90 tablet 3  . hydrOXYzine (ATARAX/VISTARIL) 25 MG tablet Take 25 mg by mouth every 3 (three) hours as needed for itching.     . Insulin Detemir (LEVEMIR) 100 UNIT/ML Pen Inject 40 Units into the skin 2 (two) times daily.    . insulin lispro (HUMALOG KWIKPEN) 100 UNIT/ML SOPN Inject 35-40 Units into the skin 3 (three) times daily. Take 35 units  (plus additional 4-12 units based on sliding scale) with breakfast, 40 units (plus additional 4-12 units based on sliding scale) with and 40 units (+ plus additional 4-12 units based on sliding scale) with dinner.    Marland Kitchen ketorolac (ACULAR LS) 0.4 % SOLN Place 1 drop into both  eyes 2 (two) times daily.     . Linaclotide (LINZESS) 290 MCG CAPS capsule Take 290 mcg by mouth every other day.    Marland Kitchen LORazepam (ATIVAN) 1 MG tablet Take 1 mg by mouth at bedtime as needed for sleep.     . metoprolol succinate (TOPROL-XL) 25 MG 24 hr tablet Take  12.5 mg by mouth 2 (two) times daily.    . Multiple Vitamins-Minerals (MULTIVITAMIN WITH MINERALS) tablet Take 1 tablet by mouth daily.    . ondansetron (ZOFRAN) 4 MG tablet Take 1 tablet (4 mg total) by mouth every 6 (six) hours. (Patient taking differently: Take 4 mg by mouth every 6 (six) hours as needed for nausea or vomiting. ) 12 tablet 0  . predniSONE (DELTASONE) 5 MG tablet Take 5 mg by mouth daily with breakfast.    . pregabalin (LYRICA) 100 MG capsule Take 100 mg by mouth at bedtime.     . Probiotic Product (PROBIOTIC PO) Take 1 capsule by mouth daily.    Marland Kitchen torsemide (DEMADEX) 20 MG tablet Take 20 mg by mouth daily.     . traMADol (ULTRAM) 50 MG tablet Take 50 mg by mouth 2 (two) times daily as needed (pain).        Lab Results: UA: > 300 prot.  Benign micro   Recent Labs  09/02/14 0510 09/03/14 0428 09/04/14 0414  WBC 14.5* 13.0* 14.1*  HGB 11.7* 11.8* 11.6*  HCT 37.3 37.4 36.5  PLT 474* 472* 427*   BMET  Recent Labs  09/02/14 0510 09/03/14 0428 09/04/14 0414  NA 129* 129* 130*  K 4.6 4.4 4.7  CL 99 97 98  CO2 21 20 20   GLUCOSE 318* 340* 186*  BUN 90* 105* 121*  CREATININE 3.72* 4.23* 4.45*  CALCIUM 8.6 8.6 8.5  PHOS  --   --  6.3*   LFT  Recent Labs  09/04/14 0414  ALBUMIN 2.8*   No results found.  ROS: No change in vision Breathing better No CP No abd pain No neuropathic sxs No new arthritic CO No dysuria  PHYSICAL EXAM: Blood pressure 120/57, pulse 64, temperature 97.6 F (36.4 C), temperature source Oral, resp. rate 19, height 5\' 2"  (1.575 m), weight 102.46 kg (225 lb 14.1 oz), SpO2 99 %. HEENT: PERRLA EOMI NECK:No JVD LUNGS:Few basilar crackles, decreased BS  throughout CARDIAC:RRR ABD:+ BS NTND no HSM EXT:tr edema  LUA AVF + bruit NEURO:CNI M&SI Ox3 no asterixis  Assessment: 1. Acute on CKD 4 most likely due to hemodynamic perturbations from acute illness.  I'm not that convinced that volume overload is a major player here.  High BUN due to steroids 2. Sec HPTH 3. Sarcoidosis 4. HTN PLAN: 1. Hold on diuretics at least for today 2. DC sudafed 3. Quickly wean steroids to her base dose 4. Daily Scr 5. Check UNa and Ucr   Arshia Spellman T 09/04/2014, 12:17 PM

## 2014-09-04 NOTE — Progress Notes (Signed)
Pt refuses CPAP. Wearing 2L [place] sat 100% at this time.

## 2014-09-04 NOTE — Progress Notes (Signed)
[facility] PROGRESS NOTE  Jumana A Marijo File NTZ:[id number] DOB: 03-Jul-1946 DOA: 08/30/2014 PCP: Antony Blackbird, MD  Admission history of present illness/brief narrative:  past medical history including chronic diastolic heart failure, coronary artery disease, chronic respiratory failure with COPD on chronic O2 3 L nasal counter as needed, morbid obesity, sarcoidosis, obstructive sleep apnea on a sleep apnea machine, chronic kidney disease stage IV, hypertension, type 2 diabetes,  presents to the ED with  of productive cough of greenish and whitish sputum, sore throat, congestion, worsening shortness of breath with exertion, diffuse wheezing, generalized weakness.patient has worsening hypoxia on exertion , chest x-ray in ED showing vascular congestion, no opacity or infiltrate, patient was admitted and started on IV Lasix, IV steroids, and get back to baseline on 3 L nasal cannula, renal function continues to worsen on Lasix.  Assessment/Plan:  acute on chronic respiratory failure - Likely multifactorial secondary to acute COPD exacerbation and mild CHF exacerbation. - Back to baseline on 3 L nasal cannula.  probable acute COPD exacerbation -Likely triggered by upper respiratory viral infection.  - Significantly improved wheezing. - on  oral Levaquin,  Mucinex, Claritin, Tessalon Perles. Continue flutter valve. Incentive spirometry. Will discontinue levofloxacin in a.m. - Currently off IV steroids, continue with prednisone taper.  Leukocytosis - A febrile , related to steroids.   mild acute on chronic diastolic CHF exacerbation - Some clinical improvement.  - Cardiac enzymes minimally elevated. Patient denies any active chest pain. Patient would recent 2-D echo done 04/27/2014 with an ejection fraction of 55-60% with no wall motion abnormalities.  - Patient now in 1-2+ bilateral lower extremity edema. Patient with increased creatinine. Off  IV Lasix. on oral Lasix ,Repeat proBNP still  elevated. Will recheck 2-D echo. - Continue aspirin, Lipitor, hydralazine, Lopressor.  metabolic acidosis -Likely secondary to chronic kidney disease. Continue bicarbonate tablets. Follow.   hypertension  Continue current home regimen of antihypertensive medications. Clonidine, metoprolol, hydralazine.  Acute on chronic kidney disease stage IV Creatinine increased with IV Lasix, will hold torsemid till patient is evaluated by nephrology .   history of hypercalcemia Continue chronic steroid therapy.   obstructive sleep apnea C Pap daily at bedtime.  diabetes mellitus Hemoglobin A1c 8.3.   Increase Levemir to 48 units twice a day. Follow closely with steroid taper. Continue sliding scale insulin. Follow.   hyperkalemia Resolved. Patient status post calcium gluconate, insulin, D50 and Kayexalate. Diet has been changed to a renal diet. Follow.   prophylaxis PPI for GI prophylaxis. Heparin for DVT prophylaxis.   Code Status: DO NOT RESUSCITATE Family Communication: Updated patient at bedside. No family present. Disposition Plan: Remain inpatient.   Consultants:  None  Procedures:  Chest x-ray 08/30/2014, 08/31/2014, 09/02/2014  Antibiotics:  IV Levaquin 08/30/2014>>09/01/14  Oral Levaquin 09/01/2014  HPI/Subjective: Patient denies any chest pain. Patient states still with sore throat and cough. Patient states shortness of breath has improved since admission. Patient complaining of generalized weakness.  Objective: Filed Vitals:   09/04/14 0450  BP: 143/48  Pulse: 64  Temp: 97.6 F (36.4 C)  Resp: 19    Intake/Output Summary (Last 24 hours) at 09/04/14 1000 Last data filed at 09/03/14 2325  Gross per 24 hour  Intake    720 ml  Output    800 ml  Net    -80 ml   Filed Weights   09/02/14 0616 09/03/14 0500 09/04/14 0450  Weight: 102.195 kg (225 lb 4.8 oz) 102.604 kg (226 lb 3.2 oz) 102.46 kg (225  lb 14.1 oz)    Exam:   General:  NAD  Cardiovascular:  RRR  Respiratory: CTAB.  fair air movement.  Abdomen: Soft, nontender, nondistended, positive bowel sounds.  Musculoskeletal: No clubbing or cyanosis. 1 + bilateral lower extremity edema.  Data Reviewed: Basic Metabolic Panel:  Recent Labs Lab 08/30/14 2104  08/31/14 0840 08/31/14 1907 09/01/14 0326 09/02/14 0510 09/03/14 0428 09/04/14 0414  NA  --   < > 130* 132* 130* 129* 129* 130*  K  --   < > 5.7* 4.7 4.7 4.6 4.4 4.7  CL  --   < > 103 103 100 99 97 98  CO2  --   < > 18* 21 18* 21 20 20   GLUCOSE  --   < > 369* 286* 263* 318* 340* 186*  BUN  --   < > 73* 76* 77* 90* 105* 121*  CREATININE  --   < > 3.73* 3.56* 3.40* 3.72* 4.23* 4.45*  CALCIUM  --   < > 8.9 9.2 8.8 8.6 8.6 8.5  MG 2.1  --   --   --   --   --   --   --   PHOS  --   --  4.1  --   --   --   --  6.3*  < > = values in this interval not displayed. Liver Function Tests:  Recent Labs Lab 08/31/14 0840 09/04/14 0414  ALBUMIN 2.6* 2.8*   No results for input(s): LIPASE, AMYLASE in the last 168 hours. No results for input(s): AMMONIA in the last 168 hours. CBC:  Recent Labs Lab 08/30/14 1155 09/01/14 0326 09/02/14 0510 09/03/14 0428 09/04/14 0414  WBC 10.1 14.7* 14.5* 13.0* 14.1*  HGB 10.9* 11.4* 11.7* 11.8* 11.6*  HCT 34.5* 36.6 37.3 37.4 36.5  MCV 89.8 88.8 89.9 90.8 87.7  PLT 493* 517* 474* 472* 427*   Cardiac Enzymes:  Recent Labs Lab 08/30/14 2104 08/31/14 0125 08/31/14 0840  TROPONINI 0.05* 0.03 0.04*   BNP (last 3 results)  Recent Labs  04/26/14 2358 06/24/14 0945  PROBNP 3725.0* 324.0*   CBG:  Recent Labs Lab 09/02/14 2123 09/03/14 0606 09/03/14 1136 09/03/14 1647 09/03/14 2118  GLUCAP 340* 286* 223* 161* 209*    No results found for this or any previous visit (from the past 240 hour(s)).   Studies: No results found.  Scheduled Meds: . antiseptic oral rinse  7 mL Mouth Rinse BID  . Apremilast  30 mg Oral Daily  . aspirin EC  81 mg Oral Daily  . atorvastatin   20 mg Oral QHS  . cloNIDine  0.1 mg Oral BID  . dextrose  1 ampule Intravenous Once  . ferrous sulfate  325 mg Oral Q breakfast  . guaiFENesin  1,200 mg Oral BID  . heparin  5,000 Units Subcutaneous 3 times per day  . hydrALAZINE  50 mg Oral TID  . insulin aspart  0-20 Units Subcutaneous TID WC  . insulin aspart  8 Units Subcutaneous TID WC  . insulin detemir  48 Units Subcutaneous BID  . ipratropium-albuterol  3 mL Nebulization BID  . ketorolac  1 drop Both Eyes BID  . ketorolac  1 drop Both Eyes BID  . [START ON 09/05/2014] levofloxacin  250 mg Oral Q48H  . Linaclotide  290 mcg Oral QODAY  . loratadine  10 mg Oral BID  . menthol-cetylpyridinium  1 lozenge Oral TID  . metoprolol tartrate  12.5 mg Oral Q12H  .  multivitamin with minerals  1 tablet Oral Daily  . pantoprazole  80 mg Oral Q1200  . [START ON 09/05/2014] predniSONE  40 mg Oral Q breakfast  . pregabalin  100 mg Oral QHS  . pseudoephedrine  120 mg Oral BID  . sodium bicarbonate  650 mg Oral BID  . sodium chloride  3 mL Intravenous Q12H  . torsemide  20 mg Oral Daily   Continuous Infusions:   Principal Problem:   Acute on chronic respiratory failure with hypoxia Active Problems:   Obstructive sleep apnea   Essential hypertension   GERD   Sarcoidosis   Back pain, chronic   Diabetes mellitus with renal manifestation   CKD (chronic kidney disease) stage 4, GFR 15-29 ml/min   History of hypercalcemia   Chronic respiratory failure with hypoxia   Acute on chronic diastolic congestive heart failure   COPD with exacerbation   Chronic obstructive pulmonary disease with acute exacerbation   Anemia, iron deficiency   Hyperkalemia   Congestive heart disease    Time spent: 40 mins    Fanny Agan MD [facility] Pager [phone]/10/2014, 10:00 AM  LOS: 5 days

## 2014-09-05 ENCOUNTER — Ambulatory Visit: Payer: Medicare Other | Admitting: Internal Medicine

## 2014-09-05 ENCOUNTER — Inpatient Hospital Stay (HOSPITAL_COMMUNITY): Payer: Medicare Other

## 2014-09-05 DIAGNOSIS — I368 Other nonrheumatic tricuspid valve disorders: Secondary | ICD-10-CM

## 2014-09-05 LAB — RENAL FUNCTION PANEL
ALBUMIN: 2.8 g/dL — AB (ref 3.5–5.2)
Anion gap: 13 (ref 5–15)
BUN: 132 mg/dL — AB (ref 6–23)
CHLORIDE: 97 mmol/L (ref 96–112)
CO2: 19 mmol/L (ref 19–32)
CREATININE: 4.92 mg/dL — AB (ref 0.50–1.10)
Calcium: 8.7 mg/dL (ref 8.4–10.5)
GFR calc Af Amer: 10 mL/min — ABNORMAL LOW (ref >90)
GFR calc non Af Amer: 8 mL/min — ABNORMAL LOW (ref >90)
Glucose, Bld: 125 mg/dL — ABNORMAL HIGH (ref 70–99)
POTASSIUM: 4.3 mmol/L (ref 3.5–5.1)
Phosphorus: 6.7 mg/dL — ABNORMAL HIGH (ref 2.3–4.6)
SODIUM: 129 mmol/L — AB (ref 135–145)

## 2014-09-05 LAB — GLUCOSE, CAPILLARY
GLUCOSE-CAPILLARY: 109 mg/dL — AB (ref 70–99)
GLUCOSE-CAPILLARY: 214 mg/dL — AB (ref 70–99)
Glucose-Capillary: 219 mg/dL — ABNORMAL HIGH (ref 70–99)
Glucose-Capillary: 236 mg/dL — ABNORMAL HIGH (ref 70–99)
Glucose-Capillary: 87 mg/dL (ref 70–99)

## 2014-09-05 LAB — CBC
HEMATOCRIT: 36.3 % (ref 36.0–46.0)
HEMOGLOBIN: 11.6 g/dL — AB (ref 12.0–15.0)
MCH: 27.9 pg (ref 26.0–34.0)
MCHC: 32 g/dL (ref 30.0–36.0)
MCV: 87.3 fL (ref 78.0–100.0)
Platelets: 435 10*3/uL — ABNORMAL HIGH (ref 150–400)
RBC: 4.16 MIL/uL (ref 3.87–5.11)
RDW: 16.6 % — ABNORMAL HIGH (ref 11.5–15.5)
WBC: 13.7 10*3/uL — AB (ref 4.0–10.5)

## 2014-09-05 LAB — SODIUM, URINE, RANDOM

## 2014-09-05 LAB — CREATININE, URINE, RANDOM: CREATININE, URINE: 92.02 mg/dL

## 2014-09-05 MED ORDER — PREDNISONE 10 MG PO TABS
10.0000 mg | ORAL_TABLET | Freq: Every day | ORAL | Status: DC
  Administered 2014-09-06: 10 mg via ORAL
  Filled 2014-09-05 (×2): qty 1

## 2014-09-05 MED ORDER — IPRATROPIUM-ALBUTEROL 0.5-2.5 (3) MG/3ML IN SOLN: 3.0000 mL | RESPIRATORY_TRACT | Status: DC | PRN

## 2014-09-05 MED ORDER — CALCIUM ACETATE 667 MG PO CAPS
667.0000 mg | ORAL_CAPSULE | Freq: Three times a day (TID) | ORAL | Status: DC
  Administered 2014-09-05 – 2014-09-09 (×13): 667 mg via ORAL
  Filled 2014-09-05 (×15): qty 1

## 2014-09-05 MED ORDER — INSULIN DETEMIR 100 UNIT/ML ~~LOC~~ SOLN
53.0000 [IU] | Freq: Two times a day (BID) | SUBCUTANEOUS | Status: DC
  Administered 2014-09-05: 53 [IU] via SUBCUTANEOUS
  Filled 2014-09-05 (×3): qty 0.53

## 2014-09-05 MED ORDER — FUROSEMIDE 80 MG PO TABS
160.0000 mg | ORAL_TABLET | Freq: Two times a day (BID) | ORAL | Status: DC
Start: 2014-09-05 — End: 2014-09-09
  Administered 2014-09-05 – 2014-09-09 (×9): 160 mg via ORAL
  Filled 2014-09-05 (×11): qty 2

## 2014-09-05 NOTE — Progress Notes (Signed)
S: Still with cough.  Appetite good O:BP 117/44 mmHg  Pulse 61  Temp(Src) 97.6 F (36.4 C) (Oral)  Resp 17  Ht 5\' 2"  (1.575 m)  Wt 105.371 kg (232 lb 4.8 oz)  BMI 42.48 kg/m2  SpO2 100%  Intake/Output Summary (Last 24 hours) at 09/05/14 4782 Last data filed at 09/05/14 0700  Gross per 24 hour  Intake    720 ml  Output    550 ml  Net    170 ml   Weight change: 2.911 kg (6 lb 6.7 oz) NFA:OZHYQ and alert CVS:RRR Resp:Decreased BS in bases with few basilar crackles Abd:+ BS NTND Ext: 1+ edema  LUA AVF + bruit NEURO:CNI Ox3 no asterixis   . antiseptic oral rinse  7 mL Mouth Rinse BID  . Apremilast  30 mg Oral Daily  . aspirin EC  81 mg Oral Daily  . atorvastatin  20 mg Oral QHS  . cloNIDine  0.1 mg Oral BID  . dextrose  1 ampule Intravenous Once  . ferrous sulfate  325 mg Oral Q breakfast  . guaiFENesin  1,200 mg Oral BID  . heparin  5,000 Units Subcutaneous 3 times per day  . hydrALAZINE  50 mg Oral TID  . insulin aspart  0-20 Units Subcutaneous TID WC  . insulin aspart  8 Units Subcutaneous TID WC  . insulin detemir  48 Units Subcutaneous BID  . ipratropium-albuterol  3 mL Nebulization BID  . ketorolac  1 drop Both Eyes BID  . ketorolac  1 drop Both Eyes BID  . Linaclotide  290 mcg Oral QODAY  . loratadine  10 mg Oral BID  . menthol-cetylpyridinium  1 lozenge Oral TID  . metoprolol tartrate  12.5 mg Oral Q12H  . multivitamin with minerals  1 tablet Oral Daily  . pantoprazole  80 mg Oral Q1200  . predniSONE  20 mg Oral Q breakfast  . pregabalin  100 mg Oral QHS  . sodium bicarbonate  650 mg Oral BID  . sodium chloride  3 mL Intravenous Q12H   No results found. BMET    Component Value Date/Time   NA 129* 09/05/2014 0449   K 4.3 09/05/2014 0449   CL 97 09/05/2014 0449   CO2 19 09/05/2014 0449   GLUCOSE 125* 09/05/2014 0449   BUN 132* 09/05/2014 0449   CREATININE 4.92* 09/05/2014 0449   CALCIUM 8.7 09/05/2014 0449   CALCIUM 12.8* 10/17/2012 1734   GFRNONAA  8* 09/05/2014 0449   GFRAA 10* 09/05/2014 0449   CBC    Component Value Date/Time   WBC 13.7* 09/05/2014 0445   WBC 4.6 07/17/2010 1551   RBC 4.16 09/05/2014 0445   RBC 3.71 07/17/2010 1551   HGB 11.6* 09/05/2014 0445   HGB 10.5* 07/17/2010 1551   HCT 36.3 09/05/2014 0445   HCT 31.5* 07/17/2010 1551   PLT 435* 09/05/2014 0445   PLT 230 07/17/2010 1551   MCV 87.3 09/05/2014 0445   MCV 84.9 07/17/2010 1551   MCH 27.9 09/05/2014 0445   MCH 28.3 07/17/2010 1551   MCHC 32.0 09/05/2014 0445   MCHC 33.3 07/17/2010 1551   RDW 16.6* 09/05/2014 0445   RDW 14.8* 07/17/2010 1551   LYMPHSABS 2.9 08/10/2014 1150   LYMPHSABS 0.8* 07/17/2010 1551   MONOABS 1.1* 08/10/2014 1150   MONOABS 0.5 07/17/2010 1551   EOSABS 0.2 08/10/2014 1150   EOSABS 0.2 07/17/2010 1551   BASOSABS 0.0 08/10/2014 1150   BASOSABS 0.0 07/17/2010 1551  Assessment: 1. Acute on CKD 4, Scr sl higher.  UO not much if accurate 2. Sec HPTH 3. Sarcoidosis 4. HTN  Plan: 1. Resume lasix 2. Urine studies not done? 3. Daily Scr.  Hopefully renal fx will improve before the need for HD but has AVF if needs it.   Lyrika Souders T

## 2014-09-05 NOTE — Progress Notes (Signed)
Bladder scan done approx. 1 hour post void- 144 ml.

## 2014-09-05 NOTE — Progress Notes (Addendum)
[facility] PROGRESS NOTE  [name] OEV:[id number] DOB: [birth year] DOA: 08/30/2014 PCP: Antony Blackbird, MD  Admission history of present illness/brief narrative:  past medical history including chronic diastolic heart failure, coronary artery disease, chronic respiratory failure with COPD on chronic O2 3 L nasal counter as needed, morbid obesity, sarcoidosis, obstructive sleep apnea on a sleep apnea machine, chronic kidney disease stage IV, hypertension, type 2 diabetes,  presents to the ED with  of productive cough of greenish and whitish sputum, sore throat, congestion, worsening shortness of breath with exertion, diffuse wheezing, generalized weakness.patient has worsening hypoxia on exertion , chest x-ray in ED showing vascular congestion, no opacity or infiltrate, patient was admitted and started on IV Lasix, IV steroids, and get back to baseline on 3 L nasal cannula, renal function continues to worsen on Lasix.  Assessment/Plan:  acute on chronic respiratory failure - Likely multifactorial secondary to acute COPD exacerbation and mild CHF exacerbation. - Back to baseline on 3 L nasal cannula.  Acute on chronic kidney disease stage IV - Creatinine continues to increase despite stopping IV Lasix. - Diuresis is managed by nephrology. - No urinary retention. - Will check renal ultrasound.   probable acute COPD exacerbation -Likely triggered by upper respiratory viral infection.  - Significantly improved wheezing. - on  oral Levaquin finished on 12/3 ,  Mucinex, Claritin, Tessalon Perles. Continue flutter valve. Incentive spirometry. - Currently off IV steroids, continue with prednisone taper. Decrease dose to 10 mg oral daily.  Leukocytosis - A febrile , related to steroids.   mild acute on chronic diastolic CHF exacerbation - Some clinical improvement.  - Cardiac enzymes minimally elevated. Patient denies any active chest pain. Patient would recent 2-D echo done  04/27/2014 with an ejection fraction of 55-60% with no wall motion abnormalities.  - Patient now in 1-2+ bilateral lower extremity edema. Patient continues to have rising and creatinine, agent from IV to oral Lasix. - Diuresis managed by nephrology. - 2-D echo pending - Continue aspirin, Lipitor, hydralazine, Lopressor.  metabolic acidosis -Likely secondary to chronic kidney disease. Continue bicarbonate tablets. Follow.   hypertension  Continue current home regimen of antihypertensive medications. Clonidine, metoprolol, hydralazine.    history of hypercalcemia Continue chronic steroid therapy.   obstructive sleep apnea C Pap daily at bedtime.  diabetes mellitus Hemoglobin A1c 8.3.  Still poorly controlled, will increase Lantus from 48 to 53 units twice a day, continue with NovoLog sliding scale and before meals.   hyperkalemia Resolved. Patient status post calcium gluconate, insulin, D50 and Kayexalate. Diet has been changed to a renal diet. Follow.   prophylaxis PPI for GI prophylaxis. Heparin for DVT prophylaxis.   Code Status: DO NOT RESUSCITATE Family Communication: Local with sister Ms Domingo Cocking over the phone. Disposition Plan: Continue to monitor renal function closely.   Consultants:  Nephrology  Procedures:  Chest x-ray 08/30/2014, 08/31/2014, 09/02/2014  Antibiotics:  IV Levaquin 08/30/2014>>09/01/14  Oral Levaquin 09/01/2014   >> 2/3  HPI/Subjective: Patient denies any chest pain. Patient states still with sore throat and cough. Patient states shortness of breath has improved since admission. Patient complaining of generalized weakness.  Objective: Filed Vitals:   09/05/14 1048  BP:   Pulse: 64  Temp:   Resp:     Intake/Output Summary (Last 24 hours) at 09/05/14 1342 Last data filed at 09/05/14 0700  Gross per 24 hour  Intake    480 ml  Output    300 ml  Net    180 ml  Filed Weights   09/03/14 0500 09/04/14 0450 09/05/14 0500  Weight:  102.604 kg (226 lb 3.2 oz) 102.46 kg (225 lb 14.1 oz) 105.371 kg (232 lb 4.8 oz)    Exam:   General:  NAD  Cardiovascular: RRR  Respiratory: CTAB.  fair air movement.  Abdomen: Soft, nontender, nondistended, positive bowel sounds.  Musculoskeletal: No clubbing or cyanosis. 1 + bilateral lower extremity edema.  Data Reviewed: Basic Metabolic Panel:  Recent Labs Lab 08/30/14 2104  08/31/14 0840  09/01/14 1761 09/02/14 0510 09/03/14 6073 09/04/14 0414 09/05/14 0449  NA  --   < > 130*  < > 130* 129* 129* 130* 129*  K  --   < > 5.7*  < > 4.7 4.6 4.4 4.7 4.3  CL  --   < > 103  < > 100 99 97 98 97  CO2  --   < > 18*  < > 18* 21 20 20 19   GLUCOSE  --   < > 369*  < > 263* 318* 340* 186* 125*  BUN  --   < > 73*  < > 77* 90* 105* 121* 132*  CREATININE  --   < > 3.73*  < > 3.40* 3.72* 4.23* 4.45* 4.92*  CALCIUM  --   < > 8.9  < > 8.8 8.6 8.6 8.5 8.7  MG 2.1  --   --   --   --   --   --   --   --   PHOS  --   --  4.1  --   --   --   --  6.3* 6.7*  < > = values in this interval not displayed. Liver Function Tests:  Recent Labs Lab 08/31/14 0840 09/04/14 0414 09/05/14 0449  ALBUMIN 2.6* 2.8* 2.8*   No results for input(s): LIPASE, AMYLASE in the last 168 hours. No results for input(s): AMMONIA in the last 168 hours. CBC:  Recent Labs Lab 09/01/14 0326 09/02/14 0510 09/03/14 0428 09/04/14 0414 09/05/14 0445  WBC 14.7* 14.5* 13.0* 14.1* 13.7*  HGB 11.4* 11.7* 11.8* 11.6* 11.6*  HCT 36.6 37.3 37.4 36.5 36.3  MCV 88.8 89.9 90.8 87.7 87.3  PLT 517* 474* 472* 427* 435*   Cardiac Enzymes:  Recent Labs Lab 08/30/14 2104 08/31/14 0125 08/31/14 0840  TROPONINI 0.05* 0.03 0.04*   BNP (last 3 results)  Recent Labs  04/26/14 2358 06/24/14 0945  PROBNP 3725.0* 324.0*   CBG:  Recent Labs Lab 09/04/14 1121 09/04/14 1610 09/04/14 2140 09/05/14 0645 09/05/14 1121  GLUCAP 215* 248* 219* 109* 214*    No results found for this or any previous visit (from the  past 240 hour(s)).   Studies: No results found.  Scheduled Meds: . antiseptic oral rinse  7 mL Mouth Rinse BID  . Apremilast  30 mg Oral Daily  . aspirin EC  81 mg Oral Daily  . atorvastatin  20 mg Oral QHS  . calcium acetate  667 mg Oral TID WC  . cloNIDine  0.1 mg Oral BID  . dextrose  1 ampule Intravenous Once  . ferrous sulfate  325 mg Oral Q breakfast  . furosemide  160 mg Oral BID  . guaiFENesin  1,200 mg Oral BID  . heparin  5,000 Units Subcutaneous 3 times per day  . hydrALAZINE  50 mg Oral TID  . insulin aspart  0-20 Units Subcutaneous TID WC  . insulin aspart  8 Units Subcutaneous TID WC  . insulin  detemir  48 Units Subcutaneous BID  . ipratropium-albuterol  3 mL Nebulization BID  . ketorolac  1 drop Both Eyes BID  . ketorolac  1 drop Both Eyes BID  . Linaclotide  290 mcg Oral QODAY  . loratadine  10 mg Oral BID  . menthol-cetylpyridinium  1 lozenge Oral TID  . metoprolol tartrate  12.5 mg Oral Q12H  . multivitamin with minerals  1 tablet Oral Daily  . pantoprazole  80 mg Oral Q1200  . predniSONE  20 mg Oral Q breakfast  . pregabalin  100 mg Oral QHS  . sodium bicarbonate  650 mg Oral BID  . sodium chloride  3 mL Intravenous Q12H   Continuous Infusions:   Principal Problem:   Acute on chronic respiratory failure with hypoxia Active Problems:   Obstructive sleep apnea   Essential hypertension   GERD   Sarcoidosis   Back pain, chronic   Diabetes mellitus with renal manifestation   CKD (chronic kidney disease) stage 4, GFR 15-29 ml/min   History of hypercalcemia   Chronic respiratory failure with hypoxia   Acute on chronic diastolic congestive heart failure   COPD with exacerbation   Chronic obstructive pulmonary disease with acute exacerbation   Anemia, iron deficiency   Hyperkalemia   Congestive heart disease    Time spent: 30 mins    Takhia Spoon MD [facility] Pager [phone]/11/2014, 1:42 PM  LOS: 6 days

## 2014-09-05 NOTE — Progress Notes (Signed)
Pt. Refused cpap. RT informed pt. To notify if she changes her mind. 

## 2014-09-05 NOTE — Progress Notes (Signed)
Medicare Important Message given? YES  (If response is "NO", the following Medicare IM given date fields will be blank)  Date Medicare IM given: 09/05/14 Medicare IM given by:  Dahlia Client [employer]

## 2014-09-05 NOTE — Progress Notes (Signed)
  Echocardiogram 2D Echocardiogram has been performed.  [name] Charleston 09/05/2014, 3:33 PM

## 2014-09-06 LAB — GLUCOSE, CAPILLARY
GLUCOSE-CAPILLARY: 141 mg/dL — AB (ref 70–99)
GLUCOSE-CAPILLARY: 131 mg/dL — AB (ref 70–99)
Glucose-Capillary: 51 mg/dL — ABNORMAL LOW (ref 70–99)
Glucose-Capillary: 77 mg/dL (ref 70–99)
Glucose-Capillary: 160 mg/dL — ABNORMAL HIGH (ref 70–99)

## 2014-09-06 LAB — BASIC METABOLIC PANEL
Anion gap: 10 (ref 5–15)
BUN: 141 mg/dL — ABNORMAL HIGH (ref 6–23)
CALCIUM: 8.6 mg/dL (ref 8.4–10.5)
CO2: 23 mmol/L (ref 19–32)
CREATININE: 4.8 mg/dL — AB (ref 0.50–1.10)
Chloride: 99 mmol/L (ref 96–112)
GFR calc non Af Amer: 9 mL/min — ABNORMAL LOW (ref >90)
GFR, EST AFRICAN AMERICAN: 10 mL/min — AB (ref >90)
GLUCOSE: 55 mg/dL — AB (ref 70–99)
Potassium: 4.4 mmol/L (ref 3.5–5.1)
Sodium: 132 mmol/L — ABNORMAL LOW (ref 135–145)

## 2014-09-06 LAB — HEPATITIS B SURFACE ANTIGEN: Hepatitis B Surface Ag: NEGATIVE

## 2014-09-06 MED ORDER — SODIUM CHLORIDE 0.9 % IV SOLN
1020.0000 mg | Freq: Once | INTRAVENOUS | Status: AC
  Administered 2014-09-06: 1020 mg via INTRAVENOUS
  Filled 2014-09-06: qty 34

## 2014-09-06 MED ORDER — INSULIN ASPART 100 UNIT/ML ~~LOC~~ SOLN
10.0000 [IU] | Freq: Three times a day (TID) | SUBCUTANEOUS | Status: DC
  Administered 2014-09-06 – 2014-09-09 (×8): 10 [IU] via SUBCUTANEOUS

## 2014-09-06 MED ORDER — INSULIN DETEMIR 100 UNIT/ML ~~LOC~~ SOLN
40.0000 [IU] | Freq: Two times a day (BID) | SUBCUTANEOUS | Status: DC
  Administered 2014-09-06 – 2014-09-07 (×3): 40 [IU] via SUBCUTANEOUS
  Filled 2014-09-06 (×4): qty 0.4

## 2014-09-06 MED ORDER — PREDNISONE 5 MG PO TABS
5.0000 mg | ORAL_TABLET | Freq: Every day | ORAL | Status: DC
  Administered 2014-09-07 – 2014-09-09 (×3): 5 mg via ORAL
  Filled 2014-09-06 (×4): qty 1

## 2014-09-06 NOTE — Progress Notes (Signed)
Pt refusing cpap at this time

## 2014-09-06 NOTE — Progress Notes (Signed)
Physical Therapy Treatment Patient Details Name: [name] MRN: [id number] DOB: 1945/10/06 Today's Date: 09/06/2014    History of Present Illness Pt with past medical history including chronic diastolic heart failure, coronary artery disease, chronic respiratory failure with COPD on chronic O2 3 L nasal counter as needed, morbid obesity, sarcoidosis, obstructive sleep apnea on a sleep apnea machine, chronic kidney disease stage IV, hypertension, type 2 diabetes, who presents to the ED with a 4 to five-day history of productive cough of greenish and whitish sputum    PT Comments    Patient demonstrates continued limitations in activity tolerance at this time, was able to ambulate in hall but limited by fatigue this session. Will continue to see and progress as tolerated. If limitations remain, may need to consider HHPT upon discharge.   Follow Up Recommendations  No PT follow up     Equipment Recommendations  None recommended by PT    Recommendations for Other Services       Precautions / Restrictions Precautions Precautions: Fall Precaution Comments: monitor O2 sats Restrictions Weight Bearing Restrictions: No    Mobility  Bed Mobility               General bed mobility comments: received up in chair  Transfers Overall transfer level: Needs assistance Equipment used: Rolling walker (2 wheeled) Transfers: Sit to/from Stand Sit to Stand: Min assist         General transfer comment: increased time and attempted x3 prior to assist, min assist to come up from lower chair surface.  Ambulation/Gait Ambulation/Gait assistance: Min guard Ambulation Distance (Feet): 180 Feet Assistive device: Rolling walker (2 wheeled) Gait Pattern/deviations: Step-through pattern;Decreased stride length;Wide base of support;Shuffle Gait velocity: decreased Gait velocity interpretation: Below normal speed for age/gender General Gait Details: Multiple standing rest breaks  during mobility. SpO2 >89% on 3 liters (occassional cues for positioning and upright posture)   Stairs            Wheelchair Mobility    Modified Rankin (Stroke Patients Only)       Balance     Sitting balance-Leahy Scale: Good     Standing balance support: During functional activity Standing balance-Leahy Scale: Poor Standing balance comment: felxed posture and heavy reliance on UE support with fatigue                    Cognition Arousal/Alertness: Awake/alert (lethargic initially) Behavior During Therapy: WFL for tasks assessed/performed Overall Cognitive Status: Within Functional Limits for tasks assessed                      Exercises      General Comments General comments (skin integrity, edema, etc.): SpO2 dropped to 89%, rebounded quickley with rest and cues for breathing      Pertinent Vitals/Pain Pain Assessment: No/denies pain    Home Living                      Prior Function            PT Goals (current goals can now be found in the care plan section) Acute Rehab PT Goals Patient Stated Goal: to get better PT Goal Formulation: With patient Time For Goal Achievement: 09/16/14 Potential to Achieve Goals: Good Progress towards PT goals: Progressing toward goals    Frequency  Min 3X/week    PT Plan Current plan remains appropriate    Co-evaluation  End of Session Equipment Utilized During Treatment: Gait belt;Oxygen Activity Tolerance: Patient limited by fatigue Patient left: in chair;with call bell/phone within reach;with family/visitor present     Time: 0929-5747 PT Time Calculation (min) (ACUTE ONLY): 18 min  Charges:  $Gait Training: 8-22 mins                    G CodesDuncan Dull 09-24-14, 3:39 PM Alben Deeds, [place] DPT  [phone]

## 2014-09-06 NOTE — Progress Notes (Signed)
Inpatient Diabetes Program Recommendations  AACE/ADA: New Consensus Statement on Inpatient Glycemic Control (2013)  Target Ranges:  Prepandial:   less than 140 mg/dL      Peak postprandial:   less than 180 mg/dL (1-2 hours)      Critically ill patients:  140 - 180 mg/dL     Results for HILA, BOLDING (MRN [id number]) as of 09/06/2014 07:47  Ref. Range 09/05/2014 06:45 09/05/2014 11:21 09/05/2014 16:18 09/05/2014 21:31  Glucose-Capillary Latest Range: 70-99 mg/dL 109 (H) 214 (H) 236 (H) 87    Results for MCKYNZIE, LIWANAG (MRN [id number]) as of 09/06/2014 07:47  Ref. Range 09/06/2014 06:55  Glucose-Capillary Latest Range: 70-99 mg/dL 51 (L)     Current Orders: Levemir 53 units bid     Novolog RESISTANT SSI     Novolog 8 units tidwc  Home Insulin: Levemir 40 units bid             Humalog 35 units breakfast/ 40 units lunch/ 40 units dinner             Humalog SSI   **Note patient's steroids have been decreased to Prednisone 10 mg daily  **Patient with Hypoglycemia this AM (CBG 51 mg/dl).  Levemir dose is quite a bit higher than her home dose.    MD- Please consider the following:   1. Decrease Levemir back to home dose of 40 units bid 2. Increase Novolog Meal Coverage to 10 units tidwc    Will follow Wyn Quaker RN, MSN, CDE Diabetes Coordinator Inpatient Diabetes Program Team Pager: [phone] (8a-10p)

## 2014-09-06 NOTE — Progress Notes (Signed)
Hypoglycemic Event  CBG: 51  Treatment: 15 GM carbohydrate snack  Symptoms: None  Follow-up CBG: Time:0715 CBG Result 77  Possible Reasons for Event: Inadequate meal intake  Comments/MD notified:Dr Elgergawy    [name], [name]  Remember to initiate Hypoglycemia Order Set & complete

## 2014-09-06 NOTE — Progress Notes (Signed)
[facility] PROGRESS NOTE  [name] KWI:[id number] DOB: 1946-03-05 DOA: 08/30/2014 PCP: Antony Blackbird, MD  Admission history of present illness/brief narrative:  past medical history including chronic diastolic heart failure, coronary artery disease, chronic respiratory failure with COPD on chronic O2 3 L nasal counter as needed, morbid obesity, sarcoidosis, obstructive sleep apnea on a sleep apnea machine, chronic kidney disease stage IV, hypertension, type 2 diabetes,  presents to the ED with  of productive cough of greenish and whitish sputum, sore throat, congestion, worsening shortness of breath with exertion, diffuse wheezing, generalized weakness.patient has worsening hypoxia on exertion , chest x-ray in ED showing vascular congestion, no opacity or infiltrate, patient was admitted and started on IV Lasix, IV steroids, and get back to baseline on 3 L nasal cannula, renal function continues to worsen on Lasix. Nephrology were consulted, renal ultrasound showing chronic changes.  Assessment/Plan:  Acute on chronic kidney disease stage IV - Nephrology consult appreciated - Diuresis is managed by nephrology, creatinine seems to stabilize on large dose Lasix. - No urinary retention. - renal ultrasound showing chronic renal disease - And has left AV fistula, ready for dialysis if needed, BUN continues to rise, continue to tapering steroids, and possible prerenal as well, no uremic symptoms yet. - Continue with daily labs   acute on chronic respiratory failure - Likely multifactorial secondary to acute COPD exacerbation and mild CHF exacerbation. - Back to baseline on 3 L nasal cannula.   probable acute COPD exacerbation -Likely triggered by upper respiratory viral infection.  - Significantly improved wheezing. - on  oral Levaquin finished on 2/3 ,  Mucinex, Claritin, Tessalon Perles. Continue flutter valve. Incentive spirometry. - Currently off IV steroids, continue with  prednisone taper. Decrease dose back to baseline of 5 mg oral daily.  Leukocytosis - A febrile , related to steroids.   mild acute on chronic diastolic CHF exacerbation - Some clinical improvement.  - Cardiac enzymes minimally elevated. Patient denies any active chest pain. Patient would recent 2-D echo done 04/27/2014 with an ejection fraction of 55-60% with no wall motion abnormalities.  - Patient now in 1-2+ bilateral lower extremity edema. Patient continues to have rising and creatinine, agent from IV to oral Lasix. - Diuresis managed by nephrology. - 2-D echo showing stable EF 55-60%, with a grade 2 diastolic dysfunction - Continue aspirin, Lipitor, hydralazine, Lopressor.    hypertension  Continue current home regimen of antihypertensive medications. Clonidine, metoprolol, hydralazine.    obstructive sleep apnea C Pap daily at bedtime.  diabetes mellitus Hemoglobin A1c 8.3.  Very labile, with an episode of hypoglycemia this a.m., so Lantus was decreased to 40 twice a day, and increased NovoLog 3 times a day before meals to 10 units.   hyperkalemia Resolved. Patient status post calcium gluconate, insulin, D50 and Kayexalate. Diet has been changed to a renal diet. Follow.   prophylaxis PPI for GI prophylaxis. Heparin for DVT prophylaxis.   Code Status: DO NOT RESUSCITATE Family Communication: Local with sister Ms Domingo Cocking over the phone 2/4. Disposition Plan: Continue to monitor renal function closely.   Consultants:  Nephrology  Procedures:  Chest x-ray 08/30/2014, 08/31/2014, 09/02/2014  Antibiotics:  IV Levaquin 08/30/2014>>09/01/14  Oral Levaquin 09/01/2014   >> 2/3  HPI/Subjective: Patient denies any chest pain. Patient states still with sore throat and cough. Patient states shortness of breath has improved since admission. Patient complaining of generalized weakness.  Objective: Filed Vitals:   09/06/14 0531  BP: 134/44  Pulse: 75  Temp:  97.4 F  (36.3 C)  Resp: 18    Intake/Output Summary (Last 24 hours) at 09/06/14 0958 Last data filed at 09/06/14 0932  Gross per 24 hour  Intake    360 ml  Output   1350 ml  Net   -990 ml   Filed Weights   09/04/14 0450 09/05/14 0500 09/06/14 0531  Weight: 102.46 kg (225 lb 14.1 oz) 105.371 kg (232 lb 4.8 oz) 104.917 kg (231 lb 4.8 oz)    Exam:   General:  NAD  Cardiovascular: RRR  Respiratory: CTAB.  fair air movement.  Abdomen: Soft, nontender, nondistended, positive bowel sounds.  Musculoskeletal: No clubbing or cyanosis. 1 + bilateral lower extremity edema.  Data Reviewed: Basic Metabolic Panel:  Recent Labs Lab 08/30/14 2104  08/31/14 0840  09/02/14 0510 09/03/14 1610 09/04/14 0414 09/05/14 0449 09/06/14 0543  NA  --   < > 130*  < > 129* 129* 130* 129* 132*  K  --   < > 5.7*  < > 4.6 4.4 4.7 4.3 4.4  CL  --   < > 103  < > 99 97 98 97 99  CO2  --   < > 18*  < > 21 20 20 19 23   GLUCOSE  --   < > 369*  < > 318* 340* 186* 125* 55*  BUN  --   < > 73*  < > 90* 105* 121* 132* 141*  CREATININE  --   < > 3.73*  < > 3.72* 4.23* 4.45* 4.92* 4.80*  CALCIUM  --   < > 8.9  < > 8.6 8.6 8.5 8.7 8.6  MG 2.1  --   --   --   --   --   --   --   --   PHOS  --   --  4.1  --   --   --  6.3* 6.7*  --   < > = values in this interval not displayed. Liver Function Tests:  Recent Labs Lab 08/31/14 0840 09/04/14 0414 09/05/14 0449  ALBUMIN 2.6* 2.8* 2.8*   No results for input(s): LIPASE, AMYLASE in the last 168 hours. No results for input(s): AMMONIA in the last 168 hours. CBC:  Recent Labs Lab 09/01/14 0326 09/02/14 0510 09/03/14 0428 09/04/14 0414 09/05/14 0445  WBC 14.7* 14.5* 13.0* 14.1* 13.7*  HGB 11.4* 11.7* 11.8* 11.6* 11.6*  HCT 36.6 37.3 37.4 36.5 36.3  MCV 88.8 89.9 90.8 87.7 87.3  PLT 517* 474* 472* 427* 435*   Cardiac Enzymes:  Recent Labs Lab 08/30/14 2104 08/31/14 0125 08/31/14 0840  TROPONINI 0.05* 0.03 0.04*   BNP (last 3 results)  Recent  Labs  04/26/14 2358 06/24/14 0945  PROBNP 3725.0* 324.0*   CBG:  Recent Labs Lab 09/05/14 1121 09/05/14 1618 09/05/14 2131 09/06/14 0655 09/06/14 0718  GLUCAP 214* 236* 87 51* 77    No results found for this or any previous visit (from the past 240 hour(s)).   Studies: US Renal  09/05/2014   CLINICAL DATA:  Acute on chronic renal failure  EXAM: RENAL/URINARY TRACT ULTRASOUND COMPLETE  COMPARISON:  None.  FINDINGS: Right Kidney:  Length: 8.7 cm. Increased parenchymal echogenicity. No mass or hydronephrosis visualized.  Left Kidney:  Length: 10 cm. Echogenicity within normal limits. No mass or hydronephrosis visualized.  Bladder:  Appears normal for degree of bladder distention.  IMPRESSION: 1. No hydronephrosis. 2. Increased parenchymal echogenicity of the right kidney compatible with medical renal disease.  Electronically Signed   By: Kerby Moors M.D.   On: 09/05/2014 18:58    Scheduled Meds: . antiseptic oral rinse  7 mL Mouth Rinse BID  . Apremilast  30 mg Oral Daily  . aspirin EC  81 mg Oral Daily  . atorvastatin  20 mg Oral QHS  . calcium acetate  667 mg Oral TID WC  . cloNIDine  0.1 mg Oral BID  . dextrose  1 ampule Intravenous Once  . ferumoxytol  1,020 mg Intravenous Once  . furosemide  160 mg Oral BID  . guaiFENesin  1,200 mg Oral BID  . heparin  5,000 Units Subcutaneous 3 times per day  . hydrALAZINE  50 mg Oral TID  . insulin aspart  0-20 Units Subcutaneous TID WC  . insulin aspart  10 Units Subcutaneous TID WC  . insulin detemir  40 Units Subcutaneous BID  . ketorolac  1 drop Both Eyes BID  . Linaclotide  290 mcg Oral QODAY  . loratadine  10 mg Oral BID  . menthol-cetylpyridinium  1 lozenge Oral TID  . metoprolol tartrate  12.5 mg Oral Q12H  . multivitamin with minerals  1 tablet Oral Daily  . pantoprazole  80 mg Oral Q1200  . predniSONE  10 mg Oral Q breakfast  . pregabalin  100 mg Oral QHS  . sodium bicarbonate  650 mg Oral BID  . sodium chloride  3  mL Intravenous Q12H   Continuous Infusions:   Principal Problem:   Acute on chronic respiratory failure with hypoxia Active Problems:   Obstructive sleep apnea   Essential hypertension   GERD   Sarcoidosis   Back pain, chronic   Diabetes mellitus with renal manifestation   CKD (chronic kidney disease) stage 4, GFR 15-29 ml/min   History of hypercalcemia   Chronic respiratory failure with hypoxia   Acute on chronic diastolic congestive heart failure   COPD with exacerbation   Chronic obstructive pulmonary disease with acute exacerbation   Anemia, iron deficiency   Hyperkalemia   Congestive heart disease    Time spent: 30 mins    Aralynn Brake MD [facility] Pager [phone]/12/2014, 9:58 AM  LOS: 7 days

## 2014-09-06 NOTE — Progress Notes (Signed)
S: Some tiredness.  No N/V  Has an appetite O:BP 134/44 mmHg  Pulse 75  Temp(Src) 97.4 F (36.3 C) (Oral)  Resp 18  Ht 5\' 2"  (1.575 m)  Wt 104.917 kg (231 lb 4.8 oz)  BMI 42.29 kg/m2  SpO2 100%  Intake/Output Summary (Last 24 hours) at 09/06/14 1884 Last data filed at 09/06/14 0534  Gross per 24 hour  Intake    480 ml  Output   1350 ml  Net   -870 ml   Weight change: -0.454 kg (-1 lb) ZYS:AYTKZ and alert CVS:RRR Resp:Decreased BS in bases  Abd:+ BS NTND Ext: 1+ edema  LUA AVF + bruit NEURO:CNI Ox3 no asterixis   . antiseptic oral rinse  7 mL Mouth Rinse BID  . Apremilast  30 mg Oral Daily  . aspirin EC  81 mg Oral Daily  . atorvastatin  20 mg Oral QHS  . calcium acetate  667 mg Oral TID WC  . cloNIDine  0.1 mg Oral BID  . dextrose  1 ampule Intravenous Once  . ferrous sulfate  325 mg Oral Q breakfast  . furosemide  160 mg Oral BID  . guaiFENesin  1,200 mg Oral BID  . heparin  5,000 Units Subcutaneous 3 times per day  . hydrALAZINE  50 mg Oral TID  . insulin aspart  0-20 Units Subcutaneous TID WC  . insulin aspart  8 Units Subcutaneous TID WC  . insulin detemir  53 Units Subcutaneous BID  . ketorolac  1 drop Both Eyes BID  . Linaclotide  290 mcg Oral QODAY  . loratadine  10 mg Oral BID  . menthol-cetylpyridinium  1 lozenge Oral TID  . metoprolol tartrate  12.5 mg Oral Q12H  . multivitamin with minerals  1 tablet Oral Daily  . pantoprazole  80 mg Oral Q1200  . predniSONE  10 mg Oral Q breakfast  . pregabalin  100 mg Oral QHS  . sodium bicarbonate  650 mg Oral BID  . sodium chloride  3 mL Intravenous Q12H   US Renal  09/05/2014   CLINICAL DATA:  Acute on chronic renal failure  EXAM: RENAL/URINARY TRACT ULTRASOUND COMPLETE  COMPARISON:  None.  FINDINGS: Right Kidney:  Length: 8.7 cm. Increased parenchymal echogenicity. No mass or hydronephrosis visualized.  Left Kidney:  Length: 10 cm. Echogenicity within normal limits. No mass or hydronephrosis visualized.  Bladder:   Appears normal for degree of bladder distention.  IMPRESSION: 1. No hydronephrosis. 2. Increased parenchymal echogenicity of the right kidney compatible with medical renal disease.   Electronically Signed   By: Kerby Moors M.D.   On: 09/05/2014 18:58   BMET    Component Value Date/Time   NA 132* 09/06/2014 0543   K 4.4 09/06/2014 0543   CL 99 09/06/2014 0543   CO2 23 09/06/2014 0543   GLUCOSE 55* 09/06/2014 0543   BUN 141* 09/06/2014 0543   CREATININE 4.80* 09/06/2014 0543   CALCIUM 8.6 09/06/2014 0543   CALCIUM 12.8* 10/17/2012 1734   GFRNONAA 9* 09/06/2014 0543   GFRAA 10* 09/06/2014 0543   CBC    Component Value Date/Time   WBC 13.7* 09/05/2014 0445   WBC 4.6 07/17/2010 1551   RBC 4.16 09/05/2014 0445   RBC 3.71 07/17/2010 1551   HGB 11.6* 09/05/2014 0445   HGB 10.5* 07/17/2010 1551   HCT 36.3 09/05/2014 0445   HCT 31.5* 07/17/2010 1551   PLT 435* 09/05/2014 0445   PLT 230 07/17/2010 1551   MCV  87.3 09/05/2014 0445   MCV 84.9 07/17/2010 1551   MCH 27.9 09/05/2014 0445   MCH 28.3 07/17/2010 1551   MCHC 32.0 09/05/2014 0445   MCHC 33.3 07/17/2010 1551   RDW 16.6* 09/05/2014 0445   RDW 14.8* 07/17/2010 1551   LYMPHSABS 2.9 08/10/2014 1150   LYMPHSABS 0.8* 07/17/2010 1551   MONOABS 1.1* 08/10/2014 1150   MONOABS 0.5 07/17/2010 1551   EOSABS 0.2 08/10/2014 1150   EOSABS 0.2 07/17/2010 1551   BASOSABS 0.0 08/10/2014 1150   BASOSABS 0.0 07/17/2010 1551     Assessment: 1. Acute on CKD 4, Scr stable to sl better.  BUN higher sec to steroids and pre renal physiology.  UO better 2. Sec HPTH 3. Sarcoidosis 4. HTN 5. Pulm HTN and suspect poor Rt heart fx  Plan: 1.  I'm concerned about the high BUN but not overtly uremic yet.  Discussed starting HD if develops uremic sxs.  Would like to give more time to see if renal fx will improve. 2. Recheck labs in AM 3. Iron levels low, will give feraheme Saje Gallop T

## 2014-09-07 LAB — RENAL FUNCTION PANEL
Albumin: 2.6 g/dL — ABNORMAL LOW (ref 3.5–5.2)
Anion gap: 9 (ref 5–15)
BUN: 139 mg/dL — ABNORMAL HIGH (ref 6–23)
CALCIUM: 8.6 mg/dL (ref 8.4–10.5)
CO2: 23 mmol/L (ref 19–32)
CREATININE: 4.49 mg/dL — AB (ref 0.50–1.10)
Chloride: 100 mmol/L (ref 96–112)
GFR calc Af Amer: 11 mL/min — ABNORMAL LOW (ref >90)
GFR calc non Af Amer: 9 mL/min — ABNORMAL LOW (ref >90)
Glucose, Bld: 69 mg/dL — ABNORMAL LOW (ref 70–99)
Phosphorus: 5.9 mg/dL — ABNORMAL HIGH (ref 2.3–4.6)
Potassium: 4.4 mmol/L (ref 3.5–5.1)
Sodium: 132 mmol/L — ABNORMAL LOW (ref 135–145)

## 2014-09-07 LAB — GLUCOSE, CAPILLARY
GLUCOSE-CAPILLARY: 182 mg/dL — AB (ref 70–99)
Glucose-Capillary: 59 mg/dL — ABNORMAL LOW (ref 70–99)
Glucose-Capillary: 93 mg/dL (ref 70–99)
Glucose-Capillary: 175 mg/dL — ABNORMAL HIGH (ref 70–99)
Glucose-Capillary: 61 mg/dL — ABNORMAL LOW (ref 70–99)
Glucose-Capillary: 64 mg/dL — ABNORMAL LOW (ref 70–99)
Glucose-Capillary: 108 mg/dL — ABNORMAL HIGH (ref 70–99)

## 2014-09-07 MED ORDER — NEPRO/CARBSTEADY PO LIQD
237.0000 mL | Freq: Two times a day (BID) | ORAL | Status: DC
  Administered 2014-09-07 – 2014-09-08 (×2): 237 mL via ORAL
  Filled 2014-09-07 (×8): qty 237

## 2014-09-07 MED ORDER — INSULIN DETEMIR 100 UNIT/ML ~~LOC~~ SOLN
35.0000 [IU] | Freq: Two times a day (BID) | SUBCUTANEOUS | Status: DC
  Administered 2014-09-08 – 2014-09-09 (×2): 35 [IU] via SUBCUTANEOUS
  Filled 2014-09-07 (×5): qty 0.35

## 2014-09-07 NOTE — Progress Notes (Signed)
Hypoglycemic Event  CBG: 61  Treatment: Crackers, peanut butter, applesauce  Symptoms: Pt stated she is cold  Follow-up CBG: Time: CBG Result:  Possible Reasons for Event: n/a  Comments/MD notified: Ralph Leyden A  Remember to initiate Hypoglycemia Order Set & complete

## 2014-09-07 NOTE — Progress Notes (Signed)
[facility] PROGRESS NOTE  [name] File JOI:[id number] DOB: 01-Oct-1945 DOA: 08/30/2014 PCP: Antony Blackbird, MD  Admission history of present illness/brief narrative:  past medical history including chronic diastolic heart failure, coronary artery disease, chronic respiratory failure with COPD on chronic O2 3 L nasal counter as needed, morbid obesity, sarcoidosis, obstructive sleep apnea on a sleep apnea machine, chronic kidney disease stage IV, hypertension, type 2 diabetes,  presents to the ED with  of productive cough of greenish and whitish sputum, sore throat, congestion, worsening shortness of breath with exertion, diffuse wheezing, generalized weakness.patient has worsening hypoxia on exertion , chest x-ray in ED showing vascular congestion, no opacity or infiltrate, patient was admitted and started on IV Lasix, IV steroids, and get back to baseline on 3 L nasal cannula, renal function continues to worsen on Lasix. Nephrology were consulted, renal ultrasound showing chronic changes.  Assessment/Plan:  Acute on chronic kidney disease stage IV - Nephrology consult appreciated - Diuresis is managed by nephrology,  on large dose Lasix, and creatinine continues to trend down gradually, BUN is stabilizing, but still significantly elevated. - No urinary retention. - renal ultrasound showing chronic renal disease - And has left AV fistula, ready for dialysis if needed, BUN continues to rise, continue to tapering steroids, and possible prerenal as well, no thickened uremic symptoms yet. - Continue with daily labs   acute on chronic respiratory failure - Likely multifactorial secondary to acute COPD exacerbation and mild CHF exacerbation. - Back to baseline on 3 L nasal cannula.   probable acute COPD exacerbation -Likely triggered by upper respiratory viral infection.  - Significantly improved wheezing. - on  oral Levaquin finished on 2/3 ,  Mucinex, Claritin, Tessalon Perles.  Continue flutter valve. Incentive spirometry. - Currently off IV steroids, continue with prednisone taper. Decrease dose back to baseline of 5 mg oral daily.  Leukocytosis - A febrile , related to steroids.   mild acute on chronic diastolic CHF exacerbation - Some clinical improvement.  - Cardiac enzymes minimally elevated. Patient denies any active chest pain. Patient  recent 2-D echo done 04/27/2014 with an ejection fraction of 55-60% with no wall motion abnormalities.  - Patient now in 1-2+ bilateral lower extremity edema.  - Diuresis managed by nephrology. - Repeat 2-D echo showing stable EF 55-60%, with a grade 2 diastolic dysfunction - Continue aspirin, Lipitor, hydralazine, Lopressor.    hypertension  Continue current home regimen of antihypertensive medications. Clonidine, metoprolol, hydralazine.  Anemia - Anemia of iron deficiency, status post IV iron.   obstructive sleep apnea C Pap daily at bedtime.  diabetes mellitus Hemoglobin A1c 8.3.  Very labile, another episode of hypoglycemia this a.m., so Lantus was decreased to 34 twice a day, and increased NovoLog before meals to 10 units 2/5.   hyperkalemia Resolved. Patient status post calcium gluconate, insulin, D50 and Kayexalate. Diet has been changed to a renal diet. Follow.   prophylaxis PPI for GI prophylaxis. Heparin for DVT prophylaxis.   Code Status: DO NOT RESUSCITATE Family Communication: Local with sister Ms Domingo Cocking over the phone 2/4. Disposition Plan: Continue to monitor renal function closely.   Consultants:  Nephrology  Procedures:  Chest x-ray 08/30/2014, 08/31/2014, 09/02/2014  Antibiotics:  IV Levaquin 08/30/2014>>09/01/14  Oral Levaquin 09/01/2014   >> 2/3  HPI/Subjective: Patient denies any chest pain. Patient states still with sore throat and cough. Patient states shortness of breath has improved since admission. Patient complaining of generalized weakness.  Objective: Filed  Vitals:   09/07/14 0500  BP: 142/58  Pulse: 66  Temp: 97.6 F (36.4 C)  Resp: 20    Intake/Output Summary (Last 24 hours) at 09/07/14 1157 Last data filed at 09/07/14 1021  Gross per 24 hour  Intake    240 ml  Output    900 ml  Net   -660 ml   Filed Weights   09/05/14 0500 09/06/14 0531 09/07/14 0500  Weight: 105.371 kg (232 lb 4.8 oz) 104.917 kg (231 lb 4.8 oz) 103.919 kg (229 lb 1.6 oz)    Exam:   General:  NAD  Cardiovascular: RRR  Respiratory: CTAB.  fair air movement.  Abdomen: Soft, nontender, nondistended, positive bowel sounds.  Musculoskeletal: No clubbing or cyanosis. 1 + bilateral lower extremity edema.  Data Reviewed: Basic Metabolic Panel:  Recent Labs Lab 09/03/14 0428 09/04/14 0414 09/05/14 0449 09/06/14 0543 09/07/14 0331  NA 129* 130* 129* 132* 132*  K 4.4 4.7 4.3 4.4 4.4  CL 97 98 97 99 100  CO2 20 20 19 23 23   GLUCOSE 340* 186* 125* 55* 69*  BUN 105* 121* 132* 141* 139*  CREATININE 4.23* 4.45* 4.92* 4.80* 4.49*  CALCIUM 8.6 8.5 8.7 8.6 8.6  PHOS  --  6.3* 6.7*  --  5.9*   Liver Function Tests:  Recent Labs Lab 09/04/14 0414 09/05/14 0449 09/07/14 0331  ALBUMIN 2.8* 2.8* 2.6*   No results for input(s): LIPASE, AMYLASE in the last 168 hours. No results for input(s): AMMONIA in the last 168 hours. CBC:  Recent Labs Lab 09/01/14 0326 09/02/14 0510 09/03/14 0428 09/04/14 0414 09/05/14 0445  WBC 14.7* 14.5* 13.0* 14.1* 13.7*  HGB 11.4* 11.7* 11.8* 11.6* 11.6*  HCT 36.6 37.3 37.4 36.5 36.3  MCV 88.8 89.9 90.8 87.7 87.3  PLT 517* 474* 472* 427* 435*   Cardiac Enzymes: No results for input(s): CKTOTAL, CKMB, CKMBINDEX, TROPONINI in the last 168 hours. BNP (last 3 results)  Recent Labs  04/26/14 2358 06/24/14 0945  PROBNP 3725.0* 324.0*   CBG:  Recent Labs Lab 09/06/14 1635 09/06/14 2123 09/07/14 0614 09/07/14 0653 09/07/14 1130  GLUCAP 141* 131* 59* 93 175*    No results found for this or any previous  visit (from the past 240 hour(s)).   Studies: US Renal  09/05/2014   CLINICAL DATA:  Acute on chronic renal failure  EXAM: RENAL/URINARY TRACT ULTRASOUND COMPLETE  COMPARISON:  None.  FINDINGS: Right Kidney:  Length: 8.7 cm. Increased parenchymal echogenicity. No mass or hydronephrosis visualized.  Left Kidney:  Length: 10 cm. Echogenicity within normal limits. No mass or hydronephrosis visualized.  Bladder:  Appears normal for degree of bladder distention.  IMPRESSION: 1. No hydronephrosis. 2. Increased parenchymal echogenicity of the right kidney compatible with medical renal disease.   Electronically Signed   By: Kerby Moors M.D.   On: 09/05/2014 18:58    Scheduled Meds: . antiseptic oral rinse  7 mL Mouth Rinse BID  . Apremilast  30 mg Oral Daily  . aspirin EC  81 mg Oral Daily  . atorvastatin  20 mg Oral QHS  . calcium acetate  667 mg Oral TID WC  . cloNIDine  0.1 mg Oral BID  . dextrose  1 ampule Intravenous Once  . feeding supplement (NEPRO CARB STEADY)  237 mL Oral BID BM  . furosemide  160 mg Oral BID  . guaiFENesin  1,200 mg Oral BID  . heparin  5,000 Units Subcutaneous 3 times per day  . hydrALAZINE  50 mg Oral TID  .  insulin aspart  0-20 Units Subcutaneous TID WC  . insulin aspart  10 Units Subcutaneous TID WC  . insulin detemir  40 Units Subcutaneous BID  . ketorolac  1 drop Both Eyes BID  . Linaclotide  290 mcg Oral QODAY  . loratadine  10 mg Oral BID  . menthol-cetylpyridinium  1 lozenge Oral TID  . metoprolol tartrate  12.5 mg Oral Q12H  . multivitamin with minerals  1 tablet Oral Daily  . pantoprazole  80 mg Oral Q1200  . predniSONE  5 mg Oral Q breakfast  . pregabalin  100 mg Oral QHS  . sodium bicarbonate  650 mg Oral BID  . sodium chloride  3 mL Intravenous Q12H   Continuous Infusions:   Principal Problem:   Acute on chronic respiratory failure with hypoxia Active Problems:   Obstructive sleep apnea   Essential hypertension   GERD   Sarcoidosis   Back  pain, chronic   Diabetes mellitus with renal manifestation   CKD (chronic kidney disease) stage 4, GFR 15-29 ml/min   History of hypercalcemia   Chronic respiratory failure with hypoxia   Acute on chronic diastolic congestive heart failure   COPD with exacerbation   Chronic obstructive pulmonary disease with acute exacerbation   Anemia, iron deficiency   Hyperkalemia   Congestive heart disease    Time spent: 30 mins    Tenita Cue MD [facility] Pager [phone]/01/2015, 11:57 AM  LOS: 8 days

## 2014-09-07 NOTE — Progress Notes (Signed)
Pt CBG this morning was 59, hypoglycemic protocol initiated, pt given cranberry and apple juice, pt CBG rechecked, CBG at 0653 is 93, pt received no morning insulin, pt call bell within reach, bed in lowest position, RN will continue to monitor for signs and symptoms of hypoglycemia.   Fulton Mole, [place] 09/07/2014

## 2014-09-07 NOTE — Progress Notes (Signed)
S: Decreased appetite.  No N/V O:BP 142/58 mmHg  Pulse 66  Temp(Src) 97.6 F (36.4 C) (Oral)  Resp 20  Ht 5\' 2"  (1.575 m)  Wt 103.919 kg (229 lb 1.6 oz)  BMI 41.89 kg/m2  SpO2 100%  Intake/Output Summary (Last 24 hours) at 09/07/14 6720 Last data filed at 09/07/14 0500  Gross per 24 hour  Intake    120 ml  Output    900 ml  Net   -780 ml   Weight change: -0.998 kg (-2 lb 3.2 oz) NOB:SJGGE and alert CVS:RRR Resp:Decreased BS in bases  Abd:+ BS NTND Ext: 1+ edema  LUA AVF + bruit NEURO:CNI Ox3  + asterixis   . antiseptic oral rinse  7 mL Mouth Rinse BID  . Apremilast  30 mg Oral Daily  . aspirin EC  81 mg Oral Daily  . atorvastatin  20 mg Oral QHS  . calcium acetate  667 mg Oral TID WC  . cloNIDine  0.1 mg Oral BID  . dextrose  1 ampule Intravenous Once  . furosemide  160 mg Oral BID  . guaiFENesin  1,200 mg Oral BID  . heparin  5,000 Units Subcutaneous 3 times per day  . hydrALAZINE  50 mg Oral TID  . insulin aspart  0-20 Units Subcutaneous TID WC  . insulin aspart  10 Units Subcutaneous TID WC  . insulin detemir  40 Units Subcutaneous BID  . ketorolac  1 drop Both Eyes BID  . Linaclotide  290 mcg Oral QODAY  . loratadine  10 mg Oral BID  . menthol-cetylpyridinium  1 lozenge Oral TID  . metoprolol tartrate  12.5 mg Oral Q12H  . multivitamin with minerals  1 tablet Oral Daily  . pantoprazole  80 mg Oral Q1200  . predniSONE  5 mg Oral Q breakfast  . pregabalin  100 mg Oral QHS  . sodium bicarbonate  650 mg Oral BID  . sodium chloride  3 mL Intravenous Q12H   US Renal  09/05/2014   CLINICAL DATA:  Acute on chronic renal failure  EXAM: RENAL/URINARY TRACT ULTRASOUND COMPLETE  COMPARISON:  None.  FINDINGS: Right Kidney:  Length: 8.7 cm. Increased parenchymal echogenicity. No mass or hydronephrosis visualized.  Left Kidney:  Length: 10 cm. Echogenicity within normal limits. No mass or hydronephrosis visualized.  Bladder:  Appears normal for degree of bladder distention.   IMPRESSION: 1. No hydronephrosis. 2. Increased parenchymal echogenicity of the right kidney compatible with medical renal disease.   Electronically Signed   By: Kerby Moors M.D.   On: 09/05/2014 18:58   BMET    Component Value Date/Time   NA 132* 09/07/2014 0331   K 4.4 09/07/2014 0331   CL 100 09/07/2014 0331   CO2 23 09/07/2014 0331   GLUCOSE 69* 09/07/2014 0331   BUN 139* 09/07/2014 0331   CREATININE 4.49* 09/07/2014 0331   CALCIUM 8.6 09/07/2014 0331   CALCIUM 12.8* 10/17/2012 1734   GFRNONAA 9* 09/07/2014 0331   GFRAA 11* 09/07/2014 0331   CBC    Component Value Date/Time   WBC 13.7* 09/05/2014 0445   WBC 4.6 07/17/2010 1551   RBC 4.16 09/05/2014 0445   RBC 3.71 07/17/2010 1551   HGB 11.6* 09/05/2014 0445   HGB 10.5* 07/17/2010 1551   HCT 36.3 09/05/2014 0445   HCT 31.5* 07/17/2010 1551   PLT 435* 09/05/2014 0445   PLT 230 07/17/2010 1551   MCV 87.3 09/05/2014 0445   MCV 84.9 07/17/2010 1551  MCH 27.9 09/05/2014 0445   MCH 28.3 07/17/2010 1551   MCHC 32.0 09/05/2014 0445   MCHC 33.3 07/17/2010 1551   RDW 16.6* 09/05/2014 0445   RDW 14.8* 07/17/2010 1551   LYMPHSABS 2.9 08/10/2014 1150   LYMPHSABS 0.8* 07/17/2010 1551   MONOABS 1.1* 08/10/2014 1150   MONOABS 0.5 07/17/2010 1551   EOSABS 0.2 08/10/2014 1150   EOSABS 0.2 07/17/2010 1551   BASOSABS 0.0 08/10/2014 1150   BASOSABS 0.0 07/17/2010 1551     Assessment: 1. Acute on CKD 4, (primary nephrologist, Dr Joelyn Oms.  Baseline Scr upper 2's) Scr improving, BUN stable 2. Sec HPTH 3. Sarcoidosis 4. HTN 5. Pulm HTN and suspect poor Rt heart fx 6. Anemia and Fe def, SP IV iron  Plan: 1.  She does have some mild uremic Sxs but since Scr trending down, I would like to give her more time rather than jump into HD.  She agrees. 2. Recheck labs in AM 3. Try Nepro    IMATTINGLY,Rakesh Dutko T

## 2014-09-08 LAB — RENAL FUNCTION PANEL
ALBUMIN: 2.5 g/dL — AB (ref 3.5–5.2)
ANION GAP: 5 (ref 5–15)
BUN: 134 mg/dL — ABNORMAL HIGH (ref 6–23)
CHLORIDE: 102 mmol/L (ref 96–112)
CO2: 30 mmol/L (ref 19–32)
Calcium: 8.9 mg/dL (ref 8.4–10.5)
Creatinine, Ser: 3.95 mg/dL — ABNORMAL HIGH (ref 0.50–1.10)
GFR, EST AFRICAN AMERICAN: 13 mL/min — AB (ref >90)
GFR, EST NON AFRICAN AMERICAN: 11 mL/min — AB (ref >90)
Glucose, Bld: 114 mg/dL — ABNORMAL HIGH (ref 70–99)
Phosphorus: 5.6 mg/dL — ABNORMAL HIGH (ref 2.3–4.6)
Potassium: 4.7 mmol/L (ref 3.5–5.1)
Sodium: 137 mmol/L (ref 135–145)

## 2014-09-08 LAB — GLUCOSE, CAPILLARY
GLUCOSE-CAPILLARY: 117 mg/dL — AB (ref 70–99)
GLUCOSE-CAPILLARY: 268 mg/dL — AB (ref 70–99)
Glucose-Capillary: 168 mg/dL — ABNORMAL HIGH (ref 70–99)
Glucose-Capillary: 88 mg/dL (ref 70–99)

## 2014-09-08 NOTE — Progress Notes (Signed)
[facility] PROGRESS NOTE  Danial A Marijo File TDV:[id number] DOB: [birth year] DOA: 08/30/2014 PCP: Antony Blackbird, MD  Admission history of present illness/brief narrative:  past medical history including chronic diastolic heart failure, coronary artery disease, chronic respiratory failure with COPD on chronic O2 3 L nasal counter as needed, morbid obesity, sarcoidosis, obstructive sleep apnea on a sleep apnea machine, chronic kidney disease stage IV, hypertension, type 2 diabetes,  presents to the ED with  of productive cough of greenish and whitish sputum, sore throat, congestion, worsening shortness of breath with exertion, diffuse wheezing, generalized weakness.patient has worsening hypoxia on exertion , chest x-ray in ED showing vascular congestion, no opacity or infiltrate, patient was admitted and started on IV Lasix, IV steroids, and get back to baseline on 3 L nasal cannula, renal function continues to worsen on Lasix. Nephrology were consulted, renal ultrasound showing chronic changes, patient Lasix was increased by nephrology, creatinine continues to improve.  Assessment/Plan:  Acute on chronic kidney disease stage IV - Nephrology consult appreciated - Diuresis is managed by nephrology,  on large dose Lasix, and creatinine continues to trend down gradually, BUN is stabilizing, but still significantly elevated. - No urinary retention. - renal ultrasound showing chronic renal disease - has left AV fistula, ready for dialysis if needed, BUN continues to rise, continue to tapering steroids, and possible prerenal as well, no significant uremic symptoms yet. - Continue with daily labs   acute on chronic respiratory failure - Likely multifactorial secondary to acute COPD exacerbation and mild CHF exacerbation. - Back to baseline on 3 L nasal cannula.   probable acute COPD exacerbation -Likely triggered by upper respiratory viral infection.  - Significantly improved wheezing. - on   oral Levaquin finished on 2/3 ,  Mucinex, Claritin, Tessalon Perles. Continue flutter valve. Incentive spirometry. - Currently off IV steroids, continue with prednisone taper. Decrease dose back to baseline of 5 mg oral daily.  Leukocytosis - A febrile , related to steroids.   mild acute on chronic diastolic CHF exacerbation - Some clinical improvement.  - Cardiac enzymes minimally elevated. Patient denies any active chest pain. Patient  recent 2-D echo done 04/27/2014 with an ejection fraction of 55-60% with no wall motion abnormalities.  - Patient now in 1-2+ bilateral lower extremity edema.  - Diuresis managed by nephrology. - Repeat 2-D echo showing stable EF 55-60%, with a grade 2 diastolic dysfunction - Continue aspirin, Lipitor, hydralazine, Lopressor.    hypertension  Continue current home regimen of antihypertensive medications. Clonidine, metoprolol, hydralazine.  Anemia - Anemia of iron deficiency, status post IV iron.   obstructive sleep apnea C Pap daily at bedtime.  diabetes mellitus Hemoglobin A1c 8.3.  Very labile, another episode of hypoglycemia this a.m., so Lantus was decreased to 34 twice a day, and increased NovoLog before meals to 10 units 2/5.   hyperkalemia Resolved. Patient status post calcium gluconate, insulin, D50 and Kayexalate. Diet has been changed to a renal diet. Follow.   prophylaxis PPI for GI prophylaxis. Heparin for DVT prophylaxis.   Code Status: DO NOT RESUSCITATE Family Communication:  with sister Ms Domingo Cocking over the phone 2/4. Disposition Plan: Continue to monitor renal function closely.   Consultants:   Nephrology  Procedures:  Chest x-ray 08/30/2014, 08/31/2014, 09/02/2014  Antibiotics:  IV Levaquin 08/30/2014>>09/01/14  Oral Levaquin 09/01/2014   >> 2/3  HPI/Subjective: Patient denies any chest pain. Patient states still with sore throat and cough. Patient states shortness of breath has improved since admission. Patient  complaining of  generalized weakness.  Objective: Filed Vitals:   09/08/14 0500  BP: 138/46  Pulse: 75  Temp: 98.3 F (36.8 C)  Resp: 19    Intake/Output Summary (Last 24 hours) at 09/08/14 1051 Last data filed at 09/08/14 1043  Gross per 24 hour  Intake    243 ml  Output   2100 ml  Net  -1857 ml   Filed Weights   09/06/14 0531 09/07/14 0500 09/08/14 0648  Weight: 104.917 kg (231 lb 4.8 oz) 103.919 kg (229 lb 1.6 oz) 103.012 kg (227 lb 1.6 oz)    Exam:   General:  NAD  Cardiovascular: RRR  Respiratory: CTAB.  fair air movement.  Abdomen: Soft, nontender, nondistended, positive bowel sounds.  Musculoskeletal: No clubbing or cyanosis. 1 + bilateral lower extremity edema.  Data Reviewed: Basic Metabolic Panel:  Recent Labs Lab 09/04/14 0414 09/05/14 0449 09/06/14 0543 09/07/14 0331 09/08/14 0425  NA 130* 129* 132* 132* 137  K 4.7 4.3 4.4 4.4 4.7  CL 98 97 99 100 102  CO2 20 19 23 23 30   GLUCOSE 186* 125* 55* 69* 114*  BUN 121* 132* 141* 139* 134*  CREATININE 4.45* 4.92* 4.80* 4.49* 3.95*  CALCIUM 8.5 8.7 8.6 8.6 8.9  PHOS 6.3* 6.7*  --  5.9* 5.6*   Liver Function Tests:  Recent Labs Lab 09/04/14 0414 09/05/14 0449 09/07/14 0331 09/08/14 0425  ALBUMIN 2.8* 2.8* 2.6* 2.5*   No results for input(s): LIPASE, AMYLASE in the last 168 hours. No results for input(s): AMMONIA in the last 168 hours. CBC:  Recent Labs Lab 09/02/14 0510 09/03/14 0428 09/04/14 0414 09/05/14 0445  WBC 14.5* 13.0* 14.1* 13.7*  HGB 11.7* 11.8* 11.6* 11.6*  HCT 37.3 37.4 36.5 36.3  MCV 89.9 90.8 87.7 87.3  PLT 474* 472* 427* 435*   Cardiac Enzymes: No results for input(s): CKTOTAL, CKMB, CKMBINDEX, TROPONINI in the last 168 hours. BNP (last 3 results)  Recent Labs  04/26/14 2358 06/24/14 0945  PROBNP 3725.0* 324.0*   CBG:  Recent Labs Lab 09/07/14 1605 09/07/14 2127 09/07/14 2155 09/07/14 2230 09/08/14 0622  GLUCAP 182* 61* 64* 108* 117*    No  results found for this or any previous visit (from the past 240 hour(s)).   Studies: No results found.  Scheduled Meds: . antiseptic oral rinse  7 mL Mouth Rinse BID  . Apremilast  30 mg Oral Daily  . aspirin EC  81 mg Oral Daily  . atorvastatin  20 mg Oral QHS  . calcium acetate  667 mg Oral TID WC  . cloNIDine  0.1 mg Oral BID  . dextrose  1 ampule Intravenous Once  . feeding supplement (NEPRO CARB STEADY)  237 mL Oral BID BM  . furosemide  160 mg Oral BID  . guaiFENesin  1,200 mg Oral BID  . heparin  5,000 Units Subcutaneous 3 times per day  . hydrALAZINE  50 mg Oral TID  . insulin aspart  0-20 Units Subcutaneous TID WC  . insulin aspart  10 Units Subcutaneous TID WC  . insulin detemir  35 Units Subcutaneous BID  . ketorolac  1 drop Both Eyes BID  . Linaclotide  290 mcg Oral QODAY  . loratadine  10 mg Oral BID  . menthol-cetylpyridinium  1 lozenge Oral TID  . metoprolol tartrate  12.5 mg Oral Q12H  . multivitamin with minerals  1 tablet Oral Daily  . pantoprazole  80 mg Oral Q1200  . predniSONE  5 mg Oral Q  breakfast  . pregabalin  100 mg Oral QHS  . sodium chloride  3 mL Intravenous Q12H   Continuous Infusions:   Principal Problem:   Acute on chronic respiratory failure with hypoxia Active Problems:   Obstructive sleep apnea   Essential hypertension   GERD   Sarcoidosis   Back pain, chronic   Diabetes mellitus with renal manifestation   CKD (chronic kidney disease) stage 4, GFR 15-29 ml/min   History of hypercalcemia   Chronic respiratory failure with hypoxia   Acute on chronic diastolic congestive heart failure   COPD with exacerbation   Chronic obstructive pulmonary disease with acute exacerbation   Anemia, iron deficiency   Hyperkalemia   Congestive heart disease    Time spent: 30 mins    Janeene Sand MD [facility] Pager [phone]/01/2015, 10:51 AM  LOS: 9 days

## 2014-09-08 NOTE — Progress Notes (Signed)
S: Decreased appetite but did eat some yest.  No N/V O:BP 138/46 mmHg  Pulse 75  Temp(Src) 98.3 F (36.8 C) (Oral)  Resp 19  Ht 5\' 2"  (1.575 m)  Wt 103.012 kg (227 lb 1.6 oz)  BMI 41.53 kg/m2  SpO2 100%  Intake/Output Summary (Last 24 hours) at 09/08/14 0707 Last data filed at 09/07/14 2257  Gross per 24 hour  Intake    483 ml  Output      0 ml  Net    483 ml   Weight change: -0.907 kg (-2 lb) CBU:LAGTX and alert CVS:RRR Resp: Clear  Abd:+ BS NTND Ext: tr-1+ edema  LUA AVF + bruit NEURO:CNI Ox3  + asterixis   . antiseptic oral rinse  7 mL Mouth Rinse BID  . Apremilast  30 mg Oral Daily  . aspirin EC  81 mg Oral Daily  . atorvastatin  20 mg Oral QHS  . calcium acetate  667 mg Oral TID WC  . cloNIDine  0.1 mg Oral BID  . dextrose  1 ampule Intravenous Once  . feeding supplement (NEPRO CARB STEADY)  237 mL Oral BID BM  . furosemide  160 mg Oral BID  . guaiFENesin  1,200 mg Oral BID  . heparin  5,000 Units Subcutaneous 3 times per day  . hydrALAZINE  50 mg Oral TID  . insulin aspart  0-20 Units Subcutaneous TID WC  . insulin aspart  10 Units Subcutaneous TID WC  . insulin detemir  35 Units Subcutaneous BID  . ketorolac  1 drop Both Eyes BID  . Linaclotide  290 mcg Oral QODAY  . loratadine  10 mg Oral BID  . menthol-cetylpyridinium  1 lozenge Oral TID  . metoprolol tartrate  12.5 mg Oral Q12H  . multivitamin with minerals  1 tablet Oral Daily  . pantoprazole  80 mg Oral Q1200  . predniSONE  5 mg Oral Q breakfast  . pregabalin  100 mg Oral QHS  . sodium bicarbonate  650 mg Oral BID  . sodium chloride  3 mL Intravenous Q12H   No results found. BMET    Component Value Date/Time   NA 137 09/08/2014 0425   K 4.7 09/08/2014 0425   CL 102 09/08/2014 0425   CO2 30 09/08/2014 0425   GLUCOSE 114* 09/08/2014 0425   BUN 134* 09/08/2014 0425   CREATININE 3.95* 09/08/2014 0425   CALCIUM 8.9 09/08/2014 0425   CALCIUM 12.8* 10/17/2012 1734   GFRNONAA 11* 09/08/2014 0425    GFRAA 13* 09/08/2014 0425   CBC    Component Value Date/Time   WBC 13.7* 09/05/2014 0445   WBC 4.6 07/17/2010 1551   RBC 4.16 09/05/2014 0445   RBC 3.71 07/17/2010 1551   HGB 11.6* 09/05/2014 0445   HGB 10.5* 07/17/2010 1551   HCT 36.3 09/05/2014 0445   HCT 31.5* 07/17/2010 1551   PLT 435* 09/05/2014 0445   PLT 230 07/17/2010 1551   MCV 87.3 09/05/2014 0445   MCV 84.9 07/17/2010 1551   MCH 27.9 09/05/2014 0445   MCH 28.3 07/17/2010 1551   MCHC 32.0 09/05/2014 0445   MCHC 33.3 07/17/2010 1551   RDW 16.6* 09/05/2014 0445   RDW 14.8* 07/17/2010 1551   LYMPHSABS 2.9 08/10/2014 1150   LYMPHSABS 0.8* 07/17/2010 1551   MONOABS 1.1* 08/10/2014 1150   MONOABS 0.5 07/17/2010 1551   EOSABS 0.2 08/10/2014 1150   EOSABS 0.2 07/17/2010 1551   BASOSABS 0.0 08/10/2014 1150   BASOSABS 0.0 07/17/2010  1551     Assessment: 1. Acute on CKD 4, (primary nephrologist, Dr Joelyn Oms.  Baseline Scr upper 2's) Scr improving, BUN stable 2. Sec HPTH 3. Sarcoidosis 4. HTN 5. Pulm HTN and suspect poor Rt heart fx 6. Anemia and Fe def, SP IV iron  Plan: 1.  Cont conservative therapy as Scr trending down.  Hopefully UO can be recorded properly 2. Dc bicarb pills for now 3. Recheck labs in AM   IMATTINGLY,Lucyle Alumbaugh T

## 2014-09-09 LAB — RENAL FUNCTION PANEL
Albumin: 2.6 g/dL — ABNORMAL LOW (ref 3.5–5.2)
Anion gap: 6 (ref 5–15)
BUN: 123 mg/dL — ABNORMAL HIGH (ref 6–23)
CALCIUM: 9.3 mg/dL (ref 8.4–10.5)
CHLORIDE: 101 mmol/L (ref 96–112)
CO2: 29 mmol/L (ref 19–32)
Creatinine, Ser: 3.61 mg/dL — ABNORMAL HIGH (ref 0.50–1.10)
GFR calc Af Amer: 14 mL/min — ABNORMAL LOW (ref >90)
GFR calc non Af Amer: 12 mL/min — ABNORMAL LOW (ref >90)
GLUCOSE: 127 mg/dL — AB (ref 70–99)
PHOSPHORUS: 3.8 mg/dL (ref 2.3–4.6)
Potassium: 4.7 mmol/L (ref 3.5–5.1)
Sodium: 136 mmol/L (ref 135–145)

## 2014-09-09 LAB — GLUCOSE, CAPILLARY: Glucose-Capillary: 131 mg/dL — ABNORMAL HIGH (ref 70–99)

## 2014-09-09 MED ORDER — INSULIN LISPRO 100 UNIT/ML (KWIKPEN): 10.0000 [IU] | PEN_INJECTOR | Freq: Three times a day (TID) | SUBCUTANEOUS | Status: DC

## 2014-09-09 MED ORDER — CALCIUM ACETATE 667 MG PO CAPS: 667.0000 mg | ORAL_CAPSULE | Freq: Three times a day (TID) | ORAL | Status: DC

## 2014-09-09 MED ORDER — WHITE PETROLATUM GEL
Status: DC | PRN
  Filled 2014-09-09: qty 28.35

## 2014-09-09 MED ORDER — INSULIN DETEMIR 100 UNIT/ML FLEXPEN: 35.0000 [IU] | PEN_INJECTOR | Freq: Two times a day (BID) | SUBCUTANEOUS | Status: DC

## 2014-09-09 MED ORDER — HYDRALAZINE HCL 50 MG PO TABS: 50.0000 mg | ORAL_TABLET | Freq: Three times a day (TID) | ORAL | Status: DC

## 2014-09-09 MED ORDER — FUROSEMIDE 80 MG PO TABS: 160.0000 mg | ORAL_TABLET | Freq: Two times a day (BID) | ORAL | Status: DC

## 2014-09-09 MED ORDER — NEPRO/CARBSTEADY PO LIQD: 237.0000 mL | Freq: Two times a day (BID) | ORAL | Status: DC

## 2014-09-09 NOTE — Progress Notes (Signed)
Physical Therapy Treatment Patient Details Name: [name] MRN: [id number] DOB: [birth year] Today's Date: 09/09/2014    History of Present Illness Pt with past medical history including chronic diastolic heart failure, coronary artery disease, chronic respiratory failure with COPD on chronic O2 3 L nasal counter as needed, morbid obesity, sarcoidosis, obstructive sleep apnea on a sleep apnea machine, chronic kidney disease stage IV, hypertension, type 2 diabetes, who presents to the ED with a 4 to five-day history of productive cough of greenish and whitish sputum    PT Comments    [name] is moving well with increased activity tolerance and decreased O2 demand with activity. Pt encouraged to continue increasing gait and HEp throughout the day. Pt able to maintain sats in the 90s throughout gait on RA today and able to recall HEP with only minor cues  Follow Up Recommendations  No PT follow up     Equipment Recommendations       Recommendations for Other Services       Precautions / Restrictions Precautions Precautions: Fall Precaution Comments: monitor O2 sats    Mobility  Bed Mobility               General bed mobility comments: in chair on arrival  Transfers Overall transfer level: Modified independent                  Ambulation/Gait Ambulation/Gait assistance: Modified independent (Device/Increase time) Ambulation Distance (Feet): 300 Feet Assistive device: Rolling walker (2 wheeled) Gait Pattern/deviations: Step-through pattern;Decreased stride length;Trunk flexed   Gait velocity interpretation: Below normal speed for age/gender General Gait Details: 4 standing rest breaks with cues for posture and breathing technique with pt maintaining 95-98% on RA throughout   Stairs            Wheelchair Mobility    Modified Rankin (Stroke Patients Only)       Balance                                    Cognition  Arousal/Alertness: Awake/alert Behavior During Therapy: WFL for tasks assessed/performed Overall Cognitive Status: Within Functional Limits for tasks assessed                      Exercises General Exercises - Lower Extremity Long Arc Quad: AROM;Seated;Both;20 reps Hip ABduction/ADduction: AROM;Seated;Both;20 reps Hip Flexion/Marching: AROM;Seated;Both;20 reps Toe Raises: AROM;Seated;Both;20 reps Heel Raises: AROM;Seated;Both;20 reps    General Comments        Pertinent Vitals/Pain Pain Assessment: No/denies pain    Home Living                      Prior Function            PT Goals (current goals can now be found in the care plan section) Progress towards PT goals: Progressing toward goals    Frequency       PT Plan Current plan remains appropriate    Co-evaluation             End of Session   Activity Tolerance: Patient tolerated treatment well Patient left: in chair;with call bell/phone within reach;with family/visitor present     Time: 1100-1120 PT Time Calculation (min) (ACUTE ONLY): 20 min  Charges:  $Gait Training: 8-22 mins                    G  Codes:      Melford Aase September 19, 2014, 1:21 PM Elwyn Reach, [place]

## 2014-09-09 NOTE — Discharge Summary (Signed)
[name], is a 69 y.o. female  DOB 01/06/1946  MRN [id number].  Admission date:  08/30/2014  Admitting Physician  Eugenie Filler, MD  Discharge Date:  09/09/2014   Primary MD  Antony Blackbird, MD  Recommendations for primary care physician for things to follow:   - PCP please check CBC, BMP during next visit, patient's creatinine at day of discharge is 3.61.  Admission Diagnosis  Chronic obstructive pulmonary disease with acute exacerbation [J44.1] Acute on chronic congestive heart failure, unspecified congestive heart failure type [I50.9]   Discharge Diagnosis  Chronic obstructive pulmonary disease with acute exacerbation [J44.1] Acute on chronic congestive heart failure, unspecified congestive heart failure type [I50.9]    Principal Problem:   Acute on chronic respiratory failure with hypoxia Active Problems:   Obstructive sleep apnea   Essential hypertension   GERD   Sarcoidosis   Back pain, chronic   Diabetes mellitus with renal manifestation   CKD (chronic kidney disease) stage 4, GFR 15-29 ml/min   History of hypercalcemia   Chronic respiratory failure with hypoxia   Acute on chronic diastolic congestive heart failure   COPD with exacerbation   Chronic obstructive pulmonary disease with acute exacerbation   Anemia, iron deficiency   Hyperkalemia   Congestive heart disease      Past Medical History  Diagnosis Date  . Essential hypertension, benign   . Coronary atherosclerosis of native coronary artery 01/04/2006    Tiny OM1 70-90% ostial stenosis, no other CAD  . COPD (chronic obstructive pulmonary disease)   . Esophageal reflux   . Dyslipidemia   . Gout   . Colon polyps     Tubular adenomatous polyps  . Spinal stenosis of lumbar region   . Bulging lumbar disc   . Chronic diastolic  heart failure   . PNA (pneumonia) 2012    With pleural effusion, requiring thoracentesis  . Chronic bronchitis   . On home oxygen therapy   . OSA (obstructive sleep apnea)     Marginally compliant with CPAP  . Type II diabetes mellitus   . Arthritis   . Chronic lower back pain   . Diverticulosis   . GERD (gastroesophageal reflux disease)   . Anxiety   . Hyperlipidemia   . Sarcoidosis of lung   . CKD (chronic kidney disease) stage 4, GFR 15-29 ml/min   . IBS (irritable bowel syndrome)   . Cataracts, both eyes   . DDUKGURK(270.6)     Past Surgical History  Procedure Laterality Date  . Vesicovaginal fistula closure w/  total abdominal hysterectomy  1992  . Bilateral total knee replacements Bilateral Rt=5/04 & Lft=1/09    by DrAlusio  . 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 Zella Richer  . Appendectomy  1957  . Tonsillectomy  1968  . Cholecystectomy  1980's  . Abdominal hysterectomy  1992  . Reduction mammaplasty Bilateral 1980's  . Cardiac catheterization  1990's  . Thoracentesis  2012  . Joint replacement    . Av  fistula placement Left 12/31/2013    Procedure: ARTERIOVENOUS (AV) FISTULA CREATION;  Surgeon: Elam Dutch, MD;  Location: [facility] OR;  Service: Vascular;  Laterality: Left;  . Colonoscopy w/ biopsies and polypectomy    . Av fistula placement Left 03/18/2014    Procedure: ARTERIOVENOUS (AV) FISTULA CREATION- LEFT BRACHIOCEPHALIC ;  Surgeon: Elam Dutch, MD;  Location: [facility] OR;  Service: Vascular;  Laterality: Left;  . Ligation of arteriovenous  fistula Left 03/18/2014    Procedure: LIGATION OF ARTERIOVENOUS  FISTULA- LEFT RADIOCEPHALIC;  Surgeon: Elam Dutch, MD;  Location: [place];  Service: Vascular;  Laterality: Left;       History of present illness and  Hospital Course:     Kindly see H&P for history of present illness and admission details, please review complete Labs, Consult reports and Test reports for all details in brief  HPI  from the history and  physical done on the day of admission HPI: [name] is a 69 y.o. female  Unfortunate female with complex past medical history including chronic diastolic heart failure, coronary artery disease, chronic respiratory failure with COPD on chronic O2 3 L nasal counter as needed, morbid obesity, sarcoidosis, obstructive sleep apnea on a sleep apnea machine, chronic kidney disease stage IV, hypertension, type 2 diabetes, who presents to the ED with a 4 to five-day history of productive cough of greenish and whitish sputum, sore throat, congestion, worsening shortness of breath with exertion, diffuse wheezing, generalized weakness. Patient denies any fevers, no emesis, no chest pain, no abdominal pain, no diarrhea, no constipation, no dysuria. Patient does endorse some chills, nausea. Patient denies any significant change in her chronic orthopnea or chronic paroxysmal nocturnal dyspnea. Patient did state that she is at increased oxygen requirements over the past few days especially with minimal exertion. Patient was seen in the emergency room, basic metabolic profile done at a BUN of 62 creatinine of 3.58 otherwise was within normal limits. BNP was elevated at 804.7. CBC had a hemoglobin of 10.9 platelet count of 493 otherwise was within normal limits. Chest x-ray which was done, showed cardiac enlargement with vascular congestion and possible mild interstitial edema. No infiltrates or effusions. Patient was given 20 mg of IV Lasix, some nebulizer treatments. Patient was assessed on ambulation with sats dropping to 88%. [facility] were called to admit the patient for further evaluation and management   Hospital Course   on chronic O2 3 L nasal counter as needed, morbid obesity, sarcoidosis, obstructive sleep apnea on a sleep apnea machine, chronic kidney disease stage IV, hypertension, type 2 diabetes, presents to the ED with of productive cough of greenish and whitish sputum, sore throat,  congestion, worsening shortness of breath with exertion, diffuse wheezing, generalized weakness.patient has worsening hypoxia on exertion , chest x-ray in ED showing vascular congestion, no opacity or infiltrate, patient was admitted and started on IV Lasix, IV steroids, and  back to baseline on 3 L nasal cannula, renal function continues to worsen on Lasix. Nephrology were consulted, renal ultrasound showing chronic changes, patient Lasix was increased by nephrology, creatinine continues to improve.  Acute on chronic kidney disease stage IV - Diuresis was managed by nephrology during hospital stay,Lasix dose was increased, and creatinine function was improving on large dose Lasix 160 mg oral twice a day, which will be her usual dose on discharge. - No urinary retention. - renal ultrasound showing chronic renal disease - has left AV fistula, ready for dialysis if needed, BUN  is elevated, T4 total due to her renal disease and steroids initially. - Nephrology will arrange for labs as an outpatient   acute on chronic respiratory failure - Likely multifactorial secondary to acute COPD exacerbation and mild CHF exacerbation. - Back to baseline on 3 L nasal cannula.   probable acute COPD exacerbation -Likely triggered by upper respiratory viral infection.  - Significantly improved wheezing. - on oral Levaquin finished on 2/3 , Mucinex, Claritin, Tessalon Perles. Continue flutter valve. Incentive spirometry during hospital stay. - Currently off IV steroids,  back to baseline prednisone 5 mg oral daily.  Leukocytosis - A febrile , related to steroids.  mild acute on chronic diastolic CHF exacerbation - Some clinical improvement.  - Cardiac enzymes minimally elevated. Patient denies any active chest pain. Patient recent 2-D echo done 04/27/2014 with an ejection fraction of 55-60% with no wall motion abnormalities.  - Diuresis managed by nephrology. - Repeat 2-D echo showing stable EF  55-60%, with a grade 2 diastolic dysfunction - Continue aspirin, Lipitor, hydralazine, Lopressor.   hypertension  Continue current home regimen of antihypertensive medications. Clonidine, metoprolol, hydralazine.  Anemia - Anemia of iron deficiency, status post IV iron. - Start on iron supplement  obstructive sleep apnea C Pap daily at bedtime.  diabetes mellitus Hemoglobin A1c 8.3.  Her insulin was adjusted during hospitalization, her overall  insulin dose was decreased most likely due to diet compliance during hospital stay, she will be discharged on 35 units Levemir twice a day, insulin sliding scale, and 10 units lispro before meals.  hyperkalemia Resolved. Patient status post calcium gluconate, insulin, D50 and Kayexalate. Diet has been changed to a renal diet. Follow.   Discharge Condition: Stable   Follow UP  Follow-up Information    Follow up with FULP, CAMMIE, MD. Schedule an appointment as soon as possible for a visit in 1 week.   Specialty:  Family Medicine   Why:  Posthospitalization follow-up   Contact information:   [place] [address] [phone]       Follow up with Rexene Agent, MD.   Specialty:  Nephrology   Why:  Office will arrange for blood work   Contact information:   [place] [address] [phone]         Discharge Instructions  and  Discharge Medications     Discharge Instructions    Diet - low sodium heart healthy    Complete by:  As directed      Discharge instructions    Complete by:  As directed   Follow with Primary MD FULP, CAMMIE, MD in 7 days , and keep your appointment with Dr. Joelyn Oms your nephrologist  Get CBC, CMP, 2 view Chest X ray checked  by Primary MD next visit.    Activity: As tolerated with Full fall precautions use walker/cane & assistance as needed   Disposition Home **   Diet: Heart Healthy, low-sodium, carbohydrate modified, renal hemodialysis  diet, with feeding assistance and aspiration precautions as needed.  For Heart failure patients - Check your Weight same time everyday, if you gain over 2 pounds, or you develop in leg swelling, experience more shortness of breath or chest pain, call your Primary MD immediately. Follow Cardiac Low Salt Diet and 1.8 lit/day fluid restriction.   On your next visit with your primary care physician please Get Medicines reviewed and adjusted.   Please request your Prim.MD to go over all Hospital Tests and Procedure/Radiological  results at the follow up, please get all Hospital records sent to your Prim MD by signing hospital release before you go home.   If you experience worsening of your admission symptoms, develop shortness of breath, life threatening emergency, suicidal or homicidal thoughts you must seek medical attention immediately by calling 911 or calling your MD immediately  if symptoms less severe.  You Must read complete instructions/literature along with all the possible adverse reactions/side effects for all the Medicines you take and that have been prescribed to you. Take any new Medicines after you have completely understood and accpet all the possible adverse reactions/side effects.   Do not drive, operating heavy machinery, perform activities at heights, swimming or participation in water activities or provide baby sitting services if your were admitted for syncope or siezures until you have seen by Primary MD or a Neurologist and advised to do so again.  Do not drive when taking Pain medications.    Do not take more than prescribed Pain, Sleep and Anxiety Medications  Special Instructions: If you have smoked or chewed Tobacco  in the last 2 yrs please stop smoking, stop any regular Alcohol  and or any Recreational drug use.  Wear Seat belts while driving.   Please note  You were cared for by a hospitalist during your hospital stay. If you have any questions about your  discharge medications or the care you received while you were in the hospital after you are discharged, you can call the unit and asked to speak with the hospitalist on call if the hospitalist that took care of you is not available. Once you are discharged, your primary care physician will handle any further medical issues. Please note that NO REFILLS for any discharge medications will be authorized once you are discharged, as it is imperative that you return to your primary care physician (or establish a relationship with a primary care physician if you do not have one) for your aftercare needs so that they can reassess your need for medications and monitor your lab values.     Increase activity slowly    Complete by:  As directed             Medication List    STOP taking these medications        ondansetron 4 MG tablet  Commonly known as:  ZOFRAN     torsemide 20 MG tablet  Commonly known as:  DEMADEX     traMADol 50 MG tablet  Commonly known as:  ULTRAM      TAKE these medications        acetaminophen 500 MG tablet  Commonly known as:  TYLENOL  Take 1,000 mg by mouth 2 (two) times daily as needed (pain).     albuterol (2.5 MG/3ML) 0.083% nebulizer solution  Commonly known as:  PROVENTIL  Take 2.5 mg by nebulization every 2 (two) hours as needed for wheezing or shortness of breath.     albuterol 108 (90 BASE) MCG/ACT inhaler  Commonly known as:  PROVENTIL HFA;VENTOLIN HFA  Inhale 2 puffs into the lungs every 4 (four) hours as needed for wheezing or shortness of breath.     aspirin EC 81 MG tablet  Take 81 mg by mouth daily.     atorvastatin 20 MG tablet  Commonly known as:  LIPITOR  Take 20 mg by mouth at bedtime.     BEANO PO  Take 1 tablet by mouth daily.     calcium acetate  667 MG capsule  Commonly known as:  PHOSLO  Take 1 capsule (667 mg total) by mouth 3 (three) times daily with meals.     cloNIDine 0.1 MG tablet  Commonly known as:  CATAPRES  Take 0.1 mg  by mouth 2 (two) times daily.     dextromethorphan-guaiFENesin 30-600 MG per 12 hr tablet  Commonly known as:  MUCINEX DM  Take 1 tablet by mouth 2 (two) times daily.     esomeprazole 40 MG capsule  Commonly known as:  NEXIUM  Take 40 mg by mouth 2 (two) times daily before a meal.     feeding supplement (NEPRO CARB STEADY) Liqd  Take 237 mLs by mouth 2 (two) times daily between meals.     ferrous sulfate 325 (65 FE) MG tablet  Take 325 mg by mouth daily with breakfast.     fluocinonide cream 0.05 %  Commonly known as:  LIDEX  Apply 1 application topically 2 (two) times daily.     fluticasone 50 MCG/ACT nasal spray  Commonly known as:  FLONASE  Place 2 sprays into both nostrils 2 (two) times daily as needed (congestion).     Fluticasone-Salmeterol 250-50 MCG/DOSE Aepb  Commonly known as:  ADVAIR  Inhale 1 puff into the lungs 2 (two) times daily.     furosemide 80 MG tablet  Commonly known as:  LASIX  Take 2 tablets (160 mg total) by mouth 2 (two) times daily.     hydrALAZINE 50 MG tablet  Commonly known as:  APRESOLINE  Take 1 tablet (50 mg total) by mouth 3 (three) times daily.     hydrOXYzine 25 MG tablet  Commonly known as:  ATARAX/VISTARIL  Take 25 mg by mouth every 3 (three) hours as needed for itching.     Insulin Detemir 100 UNIT/ML Pen  Commonly known as:  LEVEMIR  Inject 35 Units into the skin 2 (two) times daily.     insulin lispro 100 UNIT/ML KiwkPen  Commonly known as:  HUMALOG KWIKPEN  Inject 0.1 mLs (10 Units total) into the skin 3 (three) times daily. Take 10 units  (plus additional 4-12 units based on sliding scale) with breakfast, 10 units (plus additional 4-12 units based on sliding scale) with and 10 units (+ plus additional 4-12 units based on sliding scale) with dinner.Inject 35-40 Units into the skin 3 (three) times daily.     ketorolac 0.4 % Soln  Commonly known as:  ACULAR LS  Place 1 drop into both eyes 2 (two) times daily.     Linaclotide  290 MCG Caps capsule  Commonly known as:  LINZESS  Take 290 mcg by mouth every other day.     LORazepam 1 MG tablet  Commonly known as:  ATIVAN  Take 1 mg by mouth at bedtime as needed for sleep.     metoprolol succinate 25 MG 24 hr tablet  Commonly known as:  TOPROL-XL  Take 12.5 mg by mouth 2 (two) times daily.     multivitamin with minerals tablet  Take 1 tablet by mouth daily.     OTEZLA 30 MG Tabs  Generic drug:  Apremilast  Take 30 mg by mouth daily.     predniSONE 5 MG tablet  Commonly known as:  DELTASONE  Take 5 mg by mouth daily with breakfast.     pregabalin 100 MG capsule  Commonly known as:  LYRICA  Take 100 mg by mouth at bedtime.     PROBIOTIC PO  Take 1 capsule by mouth daily.          Diet and Activity recommendation: See Discharge Instructions above   Consults obtained -  Nephrology   Major procedures and Radiology Reports - PLEASE review detailed and final reports for all details, in brief -   Renal ultrasound 2-D echo   Dg Chest 2 View  09/02/2014   CLINICAL DATA:  69 year old female with pneumonia, shortness of breath. Medical history includes chronic bronchitis, COPD and home oxygen requirement.  EXAM: CHEST  2 VIEW  COMPARISON:  Prior chest x-ray 08/31/2014  FINDINGS: Stable cardiomegaly. The mediastinal contours are unchanged. Atherosclerotic calcification is noted in the transverse aorta. Mild patchy opacities in the bilateral lower lobes slightly greater on the left and right demonstrate no significant interval change since the recent prior chest x-ray. There is mild pulmonary vascular congestion but no overt edema. No pneumothorax or pleural effusion. Background chronic bronchitic change and hyperexpansion consistent with the clinical history of COPD. No acute osseous abnormality.  IMPRESSION: 1. No significant interval change in the appearance of the chest compared to 08/31/2014. 2. Stable cardiomegaly and pulmonary vascular congestion  without overt edema. 3. Mild bibasilar opacities slightly greater on the left than the right are again noted and may represent subsegmental atelectasis, or mild peribronchovascular infiltrate. A combination of chronic change and subsegmental atelectasis is favored. 4. Pulmonary hyperexpansion and central bronchitic changes consistent with the clinical history of COPD.   Electronically Signed   By: Jacqulynn Cadet M.D.   On: 09/02/2014 07:59   Dg Chest 2 View  08/31/2014   CLINICAL DATA:  Acute onset of shortness of breath. Subsequent encounter.  EXAM: CHEST  2 VIEW  COMPARISON:  Chest radiograph performed 08/30/2014  FINDINGS: The lungs are well-aerated. Mild bilateral atelectasis is noted. There is no evidence of pleural effusion or pneumothorax.  The heart is mildly enlarged. No acute osseous abnormalities are seen.  IMPRESSION: Mild bilateral atelectasis noted.  Mild cardiomegaly seen.   Electronically Signed   By: Garald Balding M.D.   On: 08/31/2014 08:51   Dg Chest 2 View  08/30/2014   CLINICAL DATA:  Cough for 2 days.  Shortness of breath.  EXAM: CHEST  2 VIEW  COMPARISON:  04/27/2014 chest x-ray  FINDINGS: The heart is enlarged but stable. There is central vascular congestion and possible mild interstitial edema. No infiltrates or effusions. The bony thorax is intact.  IMPRESSION: Cardiac enlargement with vascular congestion and possible mild interstitial edema. No infiltrates or effusions.   Electronically Signed   By: Kalman Jewels M.D.   On: 08/30/2014 13:31   US Renal  09/05/2014   CLINICAL DATA:  Acute on chronic renal failure  EXAM: RENAL/URINARY TRACT ULTRASOUND COMPLETE  COMPARISON:  None.  FINDINGS: Right Kidney:  Length: 8.7 cm. Increased parenchymal echogenicity. No mass or hydronephrosis visualized.  Left Kidney:  Length: 10 cm. Echogenicity within normal limits. No mass or hydronephrosis visualized.  Bladder:  Appears normal for degree of bladder distention.  IMPRESSION: 1. No  hydronephrosis. 2. Increased parenchymal echogenicity of the right kidney compatible with medical renal disease.   Electronically Signed   By: Kerby Moors M.D.   On: 09/05/2014 18:58    Micro Results   Results for orders placed or performed during the hospital encounter of 05/12/14  Urine culture     Status: None   Collection Time: 05/12/14  2:25 PM  Result Value Ref Range Status   Specimen Description URINE, RANDOM  Final   Special Requests NONE  Final   Culture  Setup Time   Final    05/12/2014 21:58 Performed at [facility]   Final    35,000 COLONIES/ML Performed at [facility]   Culture   Final    VIRIDANS STREPTOCOCCUS Performed at [facility]   Report Status 05/15/2014 FINAL  Final     No results found for this or any previous visit (from the past 240 hour(s)).     Today   Subjective:   [place] today has no headache,no chest abdominal pain,no new weakness tingling or numbness, feels much better today.  Objective:   Blood pressure 163/44, pulse 76, temperature 98 F (36.7 C), temperature source Oral, resp. rate 20, height 5\' 2"  (1.575 m), weight 103.012 kg (227 lb 1.6 oz), SpO2 100 %.   Intake/Output Summary (Last 24 hours) at 09/09/14 1446 Last data filed at 09/09/14 1430  Gross per 24 hour  Intake    963 ml  Output   3850 ml  Net  -2887 ml    Exam  General: NAD, awake alert 3.  Cardiovascular: RRR  Respiratory: CTAB. fair air movement.  Abdomen: Soft, nontender, nondistended, positive bowel sounds.  Musculoskeletal: No clubbing or cyanosis. 1 + bilateral lower extremity edema.  Data Review   CBC w Diff: Lab Results  Component Value Date   WBC 13.7* 09/05/2014   WBC 4.6 07/17/2010   HGB 11.6* 09/05/2014   HGB 10.5* 07/17/2010   HCT 36.3 09/05/2014   HCT 31.5* 07/17/2010   PLT 435* 09/05/2014   PLT 230 07/17/2010   LYMPHOPCT 24 08/10/2014   LYMPHOPCT 16.8 07/17/2010   MONOPCT 9  08/10/2014   MONOPCT 10.0 07/17/2010   EOSPCT 1 08/10/2014   EOSPCT 4.2 07/17/2010   BASOPCT 0 08/10/2014   BASOPCT 0.3 07/17/2010    CMP: Lab Results  Component Value Date   NA 136 09/09/2014   K 4.7 09/09/2014   CL 101 09/09/2014   CO2 29 09/09/2014   BUN 123* 09/09/2014   CREATININE 3.61* 09/09/2014   PROT 7.0 08/10/2014   ALBUMIN 2.6* 09/09/2014   BILITOT 0.4 08/10/2014   ALKPHOS 125* 08/10/2014   AST 23 08/10/2014   ALT 15 08/10/2014  .   Total Time in preparing paper work, data evaluation and todays exam - 35 minutes  Walton Digilio M.D on 09/09/2014 at 2:46 PM  [facility] Office  [phone]

## 2014-09-09 NOTE — Progress Notes (Signed)
Medicare Important Message given? YES  (If response is "NO", the following Medicare IM given date fields will be blank)  Date Medicare IM given: 09/09/14 Medicare IM given by:  Bardia Wangerin  

## 2014-09-09 NOTE — Progress Notes (Signed)
Admit: 08/30/2014 LOS: 10  20F AoCKD4 in setting of SOB/Cough.    Subjective:  GFR cont to improve  Good UOP on 160 PO BID lasix Prednisone at 5mg /day BP stable  02/07 0701 - 02/08 0700 In: 963 [P.O.:960; I.V.:3] Out: 4050 [Urine:4050]  Filed Weights   09/06/14 0531 09/07/14 0500 09/08/14 0648  Weight: 104.917 kg (231 lb 4.8 oz) 103.919 kg (229 lb 1.6 oz) 103.012 kg (227 lb 1.6 oz)    Scheduled Meds: . antiseptic oral rinse  7 mL Mouth Rinse BID  . Apremilast  30 mg Oral Daily  . aspirin EC  81 mg Oral Daily  . atorvastatin  20 mg Oral QHS  . calcium acetate  667 mg Oral TID WC  . cloNIDine  0.1 mg Oral BID  . dextrose  1 ampule Intravenous Once  . feeding supplement (NEPRO CARB STEADY)  237 mL Oral BID BM  . furosemide  160 mg Oral BID  . guaiFENesin  1,200 mg Oral BID  . heparin  5,000 Units Subcutaneous 3 times per day  . hydrALAZINE  50 mg Oral TID  . insulin aspart  0-20 Units Subcutaneous TID WC  . insulin aspart  10 Units Subcutaneous TID WC  . insulin detemir  35 Units Subcutaneous BID  . ketorolac  1 drop Both Eyes BID  . Linaclotide  290 mcg Oral QODAY  . loratadine  10 mg Oral BID  . menthol-cetylpyridinium  1 lozenge Oral TID  . metoprolol tartrate  12.5 mg Oral Q12H  . multivitamin with minerals  1 tablet Oral Daily  . pantoprazole  80 mg Oral Q1200  . predniSONE  5 mg Oral Q breakfast  . pregabalin  100 mg Oral QHS  . sodium chloride  3 mL Intravenous Q12H   Continuous Infusions:  PRN Meds:.sodium chloride, acetaminophen, dextromethorphan, fluticasone, hydrOXYzine, ipratropium-albuterol, LORazepam, ondansetron (ZOFRAN) IV, sodium chloride, white petrolatum  Current Labs: reviewed    Physical Exam:  Blood pressure 135/47, pulse 80, temperature 98.3 F (36.8 C), temperature source Oral, resp. rate 18, height 5\' 2"  (1.575 m), weight 103.012 kg (227 lb 1.6 oz), SpO2 99 %. NAD CTAB 1=  A/P 1. AoCKD4 1. BL SCr low 3s 2. Has patent / mature  AVF 3. Returning towards baseline 4. Azotemia multifactorial, including corticosteroids 5. PO Diuretic responsive 6. Plan for outpt f/u, ok for DC 7. I will arrange outpt f/u and labs with me 2. Pulm Sarcoid 3. Chronic hypoxic RF 4. Diastolic HF   Pearson Grippe MD 09/09/2014, 12:26 PM   Recent Labs Lab 09/07/14 0331 09/08/14 0425 09/09/14 0500  NA 132* 137 136  K 4.4 4.7 4.7  CL 100 102 101  CO2 23 30 29   GLUCOSE 69* 114* 127*  BUN 139* 134* 123*  CREATININE 4.49* 3.95* 3.61*  CALCIUM 8.6 8.9 9.3  PHOS 5.9* 5.6* 3.8    Recent Labs Lab 09/03/14 0428 09/04/14 0414 09/05/14 0445  WBC 13.0* 14.1* 13.7*  HGB 11.8* 11.6* 11.6*  HCT 37.4 36.5 36.3  MCV 90.8 87.7 87.3  PLT 472* 427* 435*

## 2014-09-09 NOTE — Discharge Instructions (Signed)
Follow with Primary MD FULP, CAMMIE, MD in 7 days  As well keep your scheduled appointment with Dr. Joelyn Oms from nephrology.  Get CBC, CMP, 2 view Chest X ray checked  by Primary MD next visit.    Activity: As tolerated with Full fall precautions use walker/cane & assistance as needed   Disposition Home    Diet: Heart healthy, low-sodium, renal non-hemodialysis diet, , with feeding assistance and aspiration precautions as needed.  For Heart failure patients - Check your Weight same time everyday, if you gain over 2 pounds, or you develop in leg swelling, experience more shortness of breath or chest pain, call your Primary MD immediately. Follow Cardiac Low Salt Diet and 1.8 lit/day fluid restriction.   On your next visit with your primary care physician please Get Medicines reviewed and adjusted.   Please request your Prim.MD to go over all Hospital Tests and Procedure/Radiological results at the follow up, please get all Hospital records sent to your Prim MD by signing hospital release before you go home.   If you experience worsening of your admission symptoms, develop shortness of breath, life threatening emergency, suicidal or homicidal thoughts you must seek medical attention immediately by calling 911 or calling your MD immediately  if symptoms less severe.  You Must read complete instructions/literature along with all the possible adverse reactions/side effects for all the Medicines you take and that have been prescribed to you. Take any new Medicines after you have completely understood and accpet all the possible adverse reactions/side effects.   Do not drive, operating heavy machinery, perform activities at heights, swimming or participation in water activities or provide baby sitting services if your were admitted for syncope or siezures until you have seen by Primary MD or a Neurologist and advised to do so again.  Do not drive when taking Pain medications.    Do not take  more than prescribed Pain, Sleep and Anxiety Medications  Special Instructions: If you have smoked or chewed Tobacco  in the last 2 yrs please stop smoking, stop any regular Alcohol  and or any Recreational drug use.  Wear Seat belts while driving.   Please note  You were cared for by a hospitalist during your hospital stay. If you have any questions about your discharge medications or the care you received while you were in the hospital after you are discharged, you can call the unit and asked to speak with the hospitalist on call if the hospitalist that took care of you is not available. Once you are discharged, your primary care physician will handle any further medical issues. Please note that NO REFILLS for any discharge medications will be authorized once you are discharged, as it is imperative that you return to your primary care physician (or establish a relationship with a primary care physician if you do not have one) for your aftercare needs so that they can reassess your need for medications and monitor your lab values.

## 2014-09-10 LAB — GLUCOSE, CAPILLARY: Glucose-Capillary: 222 mg/dL — ABNORMAL HIGH (ref 70–99)

## 2014-09-14 ENCOUNTER — Encounter (HOSPITAL_COMMUNITY): Payer: Self-pay | Admitting: Emergency Medicine

## 2014-09-14 DIAGNOSIS — Z9889 Other specified postprocedural states: Secondary | ICD-10-CM | POA: Diagnosis not present

## 2014-09-14 DIAGNOSIS — Z86010 Personal history of colonic polyps: Secondary | ICD-10-CM | POA: Diagnosis not present

## 2014-09-14 DIAGNOSIS — Z7951 Long term (current) use of inhaled steroids: Secondary | ICD-10-CM | POA: Diagnosis not present

## 2014-09-14 DIAGNOSIS — Z7982 Long term (current) use of aspirin: Secondary | ICD-10-CM | POA: Insufficient documentation

## 2014-09-14 DIAGNOSIS — Z79899 Other long term (current) drug therapy: Secondary | ICD-10-CM | POA: Diagnosis not present

## 2014-09-14 DIAGNOSIS — J449 Chronic obstructive pulmonary disease, unspecified: Secondary | ICD-10-CM | POA: Insufficient documentation

## 2014-09-14 DIAGNOSIS — I129 Hypertensive chronic kidney disease with stage 1 through stage 4 chronic kidney disease, or unspecified chronic kidney disease: Secondary | ICD-10-CM | POA: Insufficient documentation

## 2014-09-14 DIAGNOSIS — K219 Gastro-esophageal reflux disease without esophagitis: Secondary | ICD-10-CM | POA: Insufficient documentation

## 2014-09-14 DIAGNOSIS — G8929 Other chronic pain: Secondary | ICD-10-CM | POA: Diagnosis not present

## 2014-09-14 DIAGNOSIS — H538 Other visual disturbances: Secondary | ICD-10-CM | POA: Diagnosis not present

## 2014-09-14 DIAGNOSIS — I2510 Atherosclerotic heart disease of native coronary artery without angina pectoris: Secondary | ICD-10-CM | POA: Insufficient documentation

## 2014-09-14 DIAGNOSIS — Z794 Long term (current) use of insulin: Secondary | ICD-10-CM | POA: Diagnosis not present

## 2014-09-14 DIAGNOSIS — E119 Type 2 diabetes mellitus without complications: Secondary | ICD-10-CM | POA: Diagnosis not present

## 2014-09-14 DIAGNOSIS — F419 Anxiety disorder, unspecified: Secondary | ICD-10-CM | POA: Insufficient documentation

## 2014-09-14 DIAGNOSIS — Z8701 Personal history of pneumonia (recurrent): Secondary | ICD-10-CM | POA: Diagnosis not present

## 2014-09-14 DIAGNOSIS — N184 Chronic kidney disease, stage 4 (severe): Secondary | ICD-10-CM | POA: Diagnosis not present

## 2014-09-14 DIAGNOSIS — I5032 Chronic diastolic (congestive) heart failure: Secondary | ICD-10-CM | POA: Insufficient documentation

## 2014-09-14 DIAGNOSIS — M1990 Unspecified osteoarthritis, unspecified site: Secondary | ICD-10-CM | POA: Diagnosis not present

## 2014-09-14 DIAGNOSIS — Z7952 Long term (current) use of systemic steroids: Secondary | ICD-10-CM | POA: Diagnosis not present

## 2014-09-14 DIAGNOSIS — Z91040 Latex allergy status: Secondary | ICD-10-CM | POA: Insufficient documentation

## 2014-09-14 DIAGNOSIS — Z9981 Dependence on supplemental oxygen: Secondary | ICD-10-CM | POA: Insufficient documentation

## 2014-09-14 DIAGNOSIS — E785 Hyperlipidemia, unspecified: Secondary | ICD-10-CM | POA: Diagnosis not present

## 2014-09-14 NOTE — ED Notes (Signed)
Pt. reports blurred vision on both eyes onset 2 days ago , denies pain , no injury.

## 2014-09-15 ENCOUNTER — Emergency Department (HOSPITAL_COMMUNITY)
Admission: EM | Admit: 2014-09-15 | Discharge: 2014-09-15 | Disposition: A | Discharge status: Home / Self Care | Payer: Medicare Other | Attending: Emergency Medicine | Admitting: Emergency Medicine

## 2014-09-15 DIAGNOSIS — H538 Other visual disturbances: Secondary | ICD-10-CM

## 2014-09-15 LAB — CBC WITH DIFFERENTIAL/PLATELET
BASOS ABS: 0 10*3/uL (ref 0.0–0.1)
BASOS PCT: 0 % (ref 0–1)
EOS PCT: 1 % (ref 0–5)
Eosinophils Absolute: 0.1 10*3/uL (ref 0.0–0.7)
HCT: 37.2 % (ref 36.0–46.0)
Hemoglobin: 11.3 g/dL — ABNORMAL LOW (ref 12.0–15.0)
LYMPHS ABS: 2 10*3/uL (ref 0.7–4.0)
Lymphocytes Relative: 13 % (ref 12–46)
MCH: 28.2 pg (ref 26.0–34.0)
MCHC: 30.4 g/dL (ref 30.0–36.0)
MCV: 92.8 fL (ref 78.0–100.0)
Monocytes Absolute: 1.6 10*3/uL — ABNORMAL HIGH (ref 0.1–1.0)
Monocytes Relative: 10 % (ref 3–12)
Neutro Abs: 11.7 10*3/uL — ABNORMAL HIGH (ref 1.7–7.7)
Neutrophils Relative %: 76 % (ref 43–77)
PLATELETS: 374 10*3/uL (ref 150–400)
RBC: 4.01 MIL/uL (ref 3.87–5.11)
RDW: 17.2 % — ABNORMAL HIGH (ref 11.5–15.5)
WBC: 15.4 10*3/uL — ABNORMAL HIGH (ref 4.0–10.5)

## 2014-09-15 LAB — COMPREHENSIVE METABOLIC PANEL
ALK PHOS: 104 U/L (ref 39–117)
ALT: 17 U/L (ref 0–35)
AST: 18 U/L (ref 0–37)
Albumin: 3 g/dL — ABNORMAL LOW (ref 3.5–5.2)
Anion gap: 6 (ref 5–15)
BUN: 65 mg/dL — AB (ref 6–23)
CHLORIDE: 101 mmol/L (ref 96–112)
CO2: 32 mmol/L (ref 19–32)
Calcium: 9.9 mg/dL (ref 8.4–10.5)
Creatinine, Ser: 3.5 mg/dL — ABNORMAL HIGH (ref 0.50–1.10)
GFR, EST AFRICAN AMERICAN: 15 mL/min — AB (ref >90)
GFR, EST NON AFRICAN AMERICAN: 13 mL/min — AB (ref >90)
Glucose, Bld: 95 mg/dL (ref 70–99)
POTASSIUM: 3.5 mmol/L (ref 3.5–5.1)
SODIUM: 139 mmol/L (ref 135–145)
TOTAL PROTEIN: 6 g/dL (ref 6.0–8.3)
Total Bilirubin: 0.4 mg/dL (ref 0.3–1.2)

## 2014-09-15 MED ORDER — TETRACAINE HCL 0.5 % OP SOLN
2.0000 [drp] | Freq: Once | OPHTHALMIC | Status: AC
  Administered 2014-09-15: 2 [drp] via OPHTHALMIC
  Filled 2014-09-15: qty 2

## 2014-09-15 NOTE — ED Provider Notes (Signed)
CSN: [id number]     Arrival date & time 09/14/14  2200 History  This chart was scribed for Julianne Rice, MD by Chester Holstein, ED Scribe. This patient was seen in room B19C/B19C and the patient's care was started at 12:25 AM.    Chief Complaint  Patient presents with  . Blurred Vision    The history is provided by the patient. No language interpreter was used.   HPI Comments: [name] is a 69 y.o. female with PMHx of essential HTN, chronic diastolic heart failure, COPD, dyslipidemia, gout, spinal stenosis, OSA, GERD, diverticulosis, HLD, bilateral cataracts, chronic lower back pain, NIDDM, CKD, IBS who presents to the Emergency Department complaining of bilateral blurred vision and excessive tearing with onset two days ago. Pt notes associated frontal and sinus headache of the same duration. Pt was seen in ED on 08/30/14 for SOB, admitted, and discharged on 09/09/14 with diagnosis of exacerbated COPD and CHF.  Pt notes her BP usually runs around 140/70. Pt denies abdominal pain, rhinorrhea, eye pain, redness, visual field changes, dizziness, weakness, or any other symptoms.   Past Medical History  Diagnosis Date  . Essential hypertension, benign   . Coronary atherosclerosis of native coronary artery 01/04/2006    Tiny OM1 70-90% ostial stenosis, no other CAD  . COPD (chronic obstructive pulmonary disease)   . Esophageal reflux   . Dyslipidemia   . Gout   . Colon polyps     Tubular adenomatous polyps  . Spinal stenosis of lumbar region   . Bulging lumbar disc   . Chronic diastolic heart failure   . PNA (pneumonia) 2012    With pleural effusion, requiring thoracentesis  . Chronic bronchitis   . On home oxygen therapy   . OSA (obstructive sleep apnea)     Marginally compliant with CPAP  . Type II diabetes mellitus   . Arthritis   . Chronic lower back pain   . Diverticulosis   . GERD (gastroesophageal reflux disease)   . Anxiety   . Hyperlipidemia   . Sarcoidosis of  lung   . CKD (chronic kidney disease) stage 4, GFR 15-29 ml/min   . IBS (irritable bowel syndrome)   . Cataracts, both eyes   . TIRWERXV(400.8)    Past Surgical History  Procedure Laterality Date  . Vesicovaginal fistula closure w/  total abdominal hysterectomy  1992  . Bilateral total knee replacements Bilateral Rt=5/04 & Lft=1/09    by DrAlusio  . 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 Zella Richer  . Appendectomy  1957  . Tonsillectomy  1968  . Cholecystectomy  1980's  . Abdominal hysterectomy  1992  . Reduction mammaplasty Bilateral 1980's  . Cardiac catheterization  1990's  . Thoracentesis  2012  . Joint replacement    . Av fistula placement Left 12/31/2013    Procedure: ARTERIOVENOUS (AV) FISTULA CREATION;  Surgeon: Elam Dutch, MD;  Location: [place];  Service: Vascular;  Laterality: Left;  . Colonoscopy w/ biopsies and polypectomy    . Av fistula placement Left 03/18/2014    Procedure: ARTERIOVENOUS (AV) FISTULA CREATION- LEFT BRACHIOCEPHALIC ;  Surgeon: Elam Dutch, MD;  Location: [facility] OR;  Service: Vascular;  Laterality: Left;  . Ligation of arteriovenous  fistula Left 03/18/2014    Procedure: LIGATION OF ARTERIOVENOUS  FISTULA- LEFT RADIOCEPHALIC;  Surgeon: Elam Dutch, MD;  Location: [facility] OR;  Service: Vascular;  Laterality: Left;   Family History  Problem Relation Age of Onset  .  Diabetes Mother   . Heart disease Mother   . Hyperlipidemia Mother   . Varicose Veins Mother   . Breast cancer Maternal Aunt   . Heart disease Father   . Deep vein thrombosis Father   . Hyperlipidemia Father   . Heart disease Sister     before age 64  . Cancer Sister   . Diabetes Sister   . Hyperlipidemia Sister   . Heart attack Sister   . Heart disease Brother   . Diabetes Brother   . Hyperlipidemia Brother   . Heart attack Brother   . Hyperlipidemia Sister    History  Substance Use Topics  . Smoking status: Never Smoker   . Smokeless tobacco: Never Used  . Alcohol Use: No    OB History    No data available     Review of Systems  Constitutional: Negative for fever and chills.  HENT: Positive for sinus pressure. Negative for congestion and rhinorrhea.   Eyes: Positive for discharge (watery) and visual disturbance. Negative for pain and itching.       Tearing  Respiratory: Negative for cough and shortness of breath.   Cardiovascular: Negative for chest pain.  Gastrointestinal: Negative for nausea, vomiting and abdominal pain.  Genitourinary: Negative for dysuria, frequency and flank pain.  Skin: Negative for rash and wound.  Neurological: Positive for headaches. Negative for dizziness, syncope, weakness, light-headedness and numbness.  All other systems reviewed and are negative.     Allergies  Adhesive; Avelox; Codeine; Guaifenesin; Latex; and Oxycodone  Home Medications   Prior to Admission medications   Medication Sig Start Date End Date Taking? Authorizing Provider  acetaminophen (TYLENOL) 500 MG tablet Take 1,000 mg by mouth 2 (two) times daily as needed (pain).    Yes Historical Provider, MD  albuterol (PROVENTIL HFA;VENTOLIN HFA) 108 (90 BASE) MCG/ACT inhaler Inhale 2 puffs into the lungs every 4 (four) hours as needed for wheezing or shortness of breath.    Yes Historical Provider, MD  albuterol (PROVENTIL) (2.5 MG/3ML) 0.083% nebulizer solution Take 2.5 mg by nebulization every 2 (two) hours as needed for wheezing or shortness of breath.   Yes Historical Provider, MD  Alpha-D-Galactosidase (BEANO PO) Take 1 tablet by mouth daily.   Yes Historical Provider, MD  Apremilast (OTEZLA) 30 MG TABS Take 30 mg by mouth daily.    Yes Historical Provider, MD  aspirin EC 81 MG tablet Take 81 mg by mouth daily.   Yes Historical Provider, MD  atorvastatin (LIPITOR) 20 MG tablet Take 20 mg by mouth at bedtime.   Yes Historical Provider, MD  calcium acetate (PHOSLO) 667 MG capsule Take 1 capsule (667 mg total) by mouth 3 (three) times daily with meals. 09/09/14   Yes Phillips Climes, MD  cloNIDine (CATAPRES) 0.1 MG tablet Take 0.1 mg by mouth 2 (two) times daily.  02/12/14  Yes Historical Provider, MD  dextromethorphan-guaiFENesin (MUCINEX DM) 30-600 MG per 12 hr tablet Take 1 tablet by mouth 2 (two) times daily.   Yes Historical Provider, MD  esomeprazole (NEXIUM) 40 MG capsule Take 40 mg by mouth 2 (two) times daily before a meal.   Yes Historical Provider, MD  ferrous sulfate 325 (65 FE) MG tablet Take 325 mg by mouth daily with breakfast.   Yes Historical Provider, MD  fluocinonide cream (LIDEX) 2.95 % Apply 1 application topically 2 (two) times daily.  08/14/14  Yes Historical Provider, MD  fluticasone (FLONASE) 50 MCG/ACT nasal spray Place 2 sprays into both  nostrils 2 (two) times daily as needed (congestion).    Yes Historical Provider, MD  Fluticasone-Salmeterol (ADVAIR) 250-50 MCG/DOSE AEPB Inhale 1 puff into the lungs 2 (two) times daily.   Yes Historical Provider, MD  furosemide (LASIX) 80 MG tablet Take 2 tablets (160 mg total) by mouth 2 (two) times daily. 09/09/14  Yes Phillips Climes, MD  hydrALAZINE (APRESOLINE) 50 MG tablet Take 1 tablet (50 mg total) by mouth 3 (three) times daily. 09/09/14  Yes Phillips Climes, MD  hydrOXYzine (ATARAX/VISTARIL) 25 MG tablet Take 25 mg by mouth every 3 (three) hours as needed for itching.    Yes Historical Provider, MD  Insulin Detemir (LEVEMIR) 100 UNIT/ML Pen Inject 35 Units into the skin 2 (two) times daily. 09/09/14  Yes Dawood Elgergawy, MD  insulin lispro (HUMALOG KWIKPEN) 100 UNIT/ML KiwkPen Inject 0.1 mLs (10 Units total) into the skin 3 (three) times daily. Take 10 units  (plus additional 4-12 units based on sliding scale) with breakfast, 10 units (plus additional 4-12 units based on sliding scale) with and 10 units (+ plus additional 4-12 units based on sliding scale) with dinner.Inject 35-40 Units into the skin 3 (three) times daily. 09/09/14  Yes Dawood Elgergawy, MD  ketorolac (ACULAR LS) 0.4 % SOLN  Place 1 drop into both eyes 2 (two) times daily.    Yes Historical Provider, MD  Linaclotide (LINZESS) 290 MCG CAPS capsule Take 290 mcg by mouth every other day. 06/14/13  Yes Irene Shipper, MD  LORazepam (ATIVAN) 1 MG tablet Take 1 mg by mouth at bedtime as needed for sleep.    Yes Historical Provider, MD  metoprolol succinate (TOPROL-XL) 25 MG 24 hr tablet Take 12.5 mg by mouth 2 (two) times daily.   Yes Historical Provider, MD  Multiple Vitamins-Minerals (MULTIVITAMIN WITH MINERALS) tablet Take 1 tablet by mouth daily.   Yes Historical Provider, MD  predniSONE (DELTASONE) 5 MG tablet Take 5 mg by mouth daily with breakfast.   Yes Historical Provider, MD  pregabalin (LYRICA) 100 MG capsule Take 100 mg by mouth at bedtime.    Yes Historical Provider, MD  Probiotic Product (PROBIOTIC PO) Take 1 capsule by mouth daily.   Yes Historical Provider, MD  Nutritional Supplements (FEEDING SUPPLEMENT, NEPRO CARB STEADY,) LIQD Take 237 mLs by mouth 2 (two) times daily between meals. Patient not taking: Reported on 09/15/2014 09/09/14   Phillips Climes, MD   BP 165/64 mmHg  Pulse 72  Temp(Src) 97.6 F (36.4 C) (Oral)  Ht 5\' 2"  (1.575 m)  Wt 202 lb 1 oz (91.655 kg)  BMI 36.95 kg/m2  SpO2 99% Physical Exam  Constitutional: She is oriented to person, place, and time. She appears well-developed and well-nourished. No distress.  HENT:  Head: Normocephalic and atraumatic.  Mouth/Throat: Oropharynx is clear and moist.  Mild right frontal sinus tenderness with percussion.  Eyes: Conjunctivae and EOM are normal. Pupils are equal, round, and reactive to light.  Watery discharge from both eyes. Visual fields intact.  Neck: Normal range of motion. Neck supple.  Cardiovascular: Normal rate and regular rhythm.   Pulmonary/Chest: Effort normal and breath sounds normal. No respiratory distress. She has no wheezes. She has no rales.  Abdominal: Soft. Bowel sounds are normal. She exhibits no distension and no mass.  There is no tenderness. There is no rebound and no guarding.  Musculoskeletal: Normal range of motion. She exhibits no edema or tenderness.  Neurological: She is alert and oriented to person, place, and time.  Patient  is alert and oriented x3 with clear, goal oriented speech. Patient has 5/5 motor in all extremities. Sensation is intact to light touch.   Skin: Skin is warm and dry. No rash noted. No erythema.  Psychiatric: She has a normal mood and affect. Her behavior is normal.  Nursing note and vitals reviewed.   ED Course  Procedures (including critical care time) DIAGNOSTIC STUDIES: Oxygen Saturation is 99% on room air, normal by my interpretation.    COORDINATION OF CARE: 12:31 AM Discussed treatment plan with patient at beside, the patient agrees with the plan and has no further questions at this time.   Labs Review Labs Reviewed - No data to display  Imaging Review No results found.   EKG Interpretation None      MDM   Final diagnoses:  None    I personally performed the services described in this documentation, which was scribed in my presence. The recorded information has been reviewed and is accurate.     Tono-Pen readings right eye 8, 12:  left eye 22, 24.  Corrected visual acuity 20/100 bilateral eyes.   Discussed with Dr. Percell Locus, ophthalmology on call. Discussed pressures and exam with him. He states the patient does need to be followed up but can wait to be seen by her ophthalmologist early next week. Discussed with patient and family. They're in agreement with plan. They understand to return for worsening visual changes, pain or for any other concerns.  Julianne Rice, MD 09/15/14 [phone]

## 2014-09-15 NOTE — ED Notes (Signed)
Pt reports bilateral blurred vision x2 days. Pt denies dizziness or weakness, pt alert and oriented.  No pain noted.

## 2014-09-15 NOTE — ED Notes (Signed)
Pt made aware to return if symptoms worsen or if any life threatening symptoms occur.   

## 2014-09-15 NOTE — Discharge Instructions (Signed)
Call and make an appointment to see the seen by Dr. Zadie Rhine early next week. Return immediately for worsening vision, eye pain, redness or for any concerns.  Blurred Vision You have been seen today complaining of blurred vision. This means you have a loss of ability to see small details.  CAUSES  Blurred vision can be a symptom of underlying eye problems, such as:  Aging of the eye (presbyopia).  Glaucoma.  Cataracts.  Eye infection.  Eye-related migraine.  Diabetes mellitus.  Fatigue.  Migraine headaches.  High blood pressure.  Breakdown of the back of the eye (macular degeneration).  Problems caused by some medications. The most common cause of blurred vision is the need for eyeglasses or a new prescription. Today in the emergency department, no cause for your blurred vision can be found. SYMPTOMS  Blurred vision is the loss of visual sharpness and detail (acuity). DIAGNOSIS  Should blurred vision continue, you should see your caregiver. If your caregiver is your primary care physician, he or she may choose to refer you to another specialist.  TREATMENT  Do not ignore your blurred vision. Make sure to have it checked out to see if further treatment or referral is necessary. SEEK MEDICAL CARE IF:  You are unable to get into a specialist so we can help you with a referral. SEEK IMMEDIATE MEDICAL CARE IF: You have severe eye pain, severe headache, or sudden loss of vision. MAKE SURE YOU:   Understand these instructions.  Will watch your condition.  Will get help right away if you are not doing well or get worse. Document Released: 07/22/2003 Document Revised: 10/11/2011 Document Reviewed: 02/21/2008 [facility] Patient Information 2015 [place], [place]. This information is not intended to replace advice given to you by your health care provider. Make sure you discuss any questions you have with your health care provider.

## 2014-09-17 ENCOUNTER — Telehealth: Payer: Self-pay | Admitting: Pulmonary Disease

## 2014-09-17 NOTE — Telephone Encounter (Signed)
lmtcb for pt. Other number listed for Dr. Siri Cole office is incorrect.

## 2014-09-18 NOTE — Telephone Encounter (Signed)
lmtcb for pt.  

## 2014-09-19 NOTE — Telephone Encounter (Signed)
lmtcb x2 

## 2014-09-19 NOTE — Telephone Encounter (Signed)
Spoke with pt and advised that she needs to call her insurance company to see what they will cover in place of Advair.  Pt verbalized understanding and will call us back with update.

## 2014-09-20 ENCOUNTER — Ambulatory Visit: Payer: Medicare Other | Admitting: Cardiology

## 2014-09-20 NOTE — Telephone Encounter (Signed)
lmtcb for pt.  

## 2014-09-23 NOTE — Telephone Encounter (Signed)
Spoke with patient, states that she spoke with her insurance company and they told her there were no alternatives for Advair. Advised the patient that I would contact her insurance company to inquire. [employer] [phone] Member ID# [id number]  Spoke with Chantel at [facility] is denied d/t a quantity limitation. Insurance will only pay for a 30 for 30 day supply. Form is being faxed to triage to have SN fill out and fax back. All alternatives will require a quantity override as well and will not be any cheaper than the Advair as they are all Tier 3 medications(nothing lower).   In the future all Prior Authorizations and medication coverage inquiries will have to be completed via [email or web address]  Will hold in triage to follow up on - this will be denied in 72 hours if clinicals are not completed and faxed back.

## 2014-09-25 MED ORDER — FLUTICASONE-SALMETEROL 250-50 MCG/DOSE IN AEPB: 1.0000 | INHALATION_SPRAY | Freq: Two times a day (BID) | RESPIRATORY_TRACT | Status: DC

## 2014-09-25 NOTE — Telephone Encounter (Signed)
Chelsea from [facility] called in stating that request is not necessary because the quaninty requested is in allowed limit.. [phone]

## 2014-09-25 NOTE — Telephone Encounter (Signed)
Spoke with Claiborne Billings at [facility] to verify this info.  Spoke with pt to make her aware.  Left a sample up front for patient until her med is delivered.  Resent this med to [name].  Nohting further needed.

## 2014-10-14 NOTE — Progress Notes (Signed)
Cardiology Office Note   Date:  10/15/2014   ID:  [name], Buckle 02-14-1946, MRN [id number]  PCP:  Antony Blackbird, MD  Cardiologist:   Sueanne Margarita, MD   Chief Complaint  Patient presents with  . Congestive Heart Failure  . Hypertension  . Coronary Artery Disease      History of Present Illness: [name] is a 69 y.o. female chronic diastolic CHF, COPD on chronic O2 at 3L, morbid obesity, sarcoidosis, OSA on CPAP, CKD stage IV and HTN who presents today for followup.  She was in the hospital in January with an acute exacerbation of diastolic CHF.  She denies any chest pain. She has chronic SOB and thinks she is back to her baseline for the most part but with some exertion has some increased SOB but has been outside in the pollen. She also has chronic LE edema.  She denies any irregularity to her HR, dizziness or syncope. She sleeps on a hospital bed and cannot lay flat due to SOB. She has significant orthopnea and has to sleep almost sitting up sometimes. She denies any PND recently.    Past Medical History  Diagnosis Date  . Essential hypertension, benign   . Coronary atherosclerosis of native coronary artery 01/04/2006    Tiny OM1 70-90% ostial stenosis, no other CAD  . COPD (chronic obstructive pulmonary disease)   . Esophageal reflux   . Dyslipidemia   . Gout   . Colon polyps     Tubular adenomatous polyps  . Spinal stenosis of lumbar region   . Bulging lumbar disc   . Chronic diastolic heart failure   . PNA (pneumonia) 2012    With pleural effusion, requiring thoracentesis  . Chronic bronchitis   . On home oxygen therapy   . OSA (obstructive sleep apnea)     Marginally compliant with CPAP  . Type II diabetes mellitus   . Arthritis   . Chronic lower back pain   . Diverticulosis   . GERD (gastroesophageal reflux disease)   . Anxiety   . Hyperlipidemia   . Sarcoidosis of lung   . CKD (chronic kidney disease) stage 4, GFR 15-29 ml/min     . IBS (irritable bowel syndrome)   . Cataracts, both eyes   . YJEHUDJS(970.2)     Past Surgical History  Procedure Laterality Date  . Vesicovaginal fistula closure w/  total abdominal hysterectomy  1992  . Bilateral total knee replacements Bilateral Rt=5/04 & Lft=1/09    by DrAlusio  . 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 Zella Richer  . Appendectomy  1957  . Tonsillectomy  1968  . Cholecystectomy  1980's  . Abdominal hysterectomy  1992  . Reduction mammaplasty Bilateral 1980's  . Cardiac catheterization  1990's  . Thoracentesis  2012  . Joint replacement    . Av fistula placement Left 12/31/2013    Procedure: ARTERIOVENOUS (AV) FISTULA CREATION;  Surgeon: Elam Dutch, MD;  Location: [facility];  Service: Vascular;  Laterality: Left;  . Colonoscopy w/ biopsies and polypectomy    . Av fistula placement Left 03/18/2014    Procedure: ARTERIOVENOUS (AV) FISTULA CREATION- LEFT BRACHIOCEPHALIC ;  Surgeon: Elam Dutch, MD;  Location: [facility] OR;  Service: Vascular;  Laterality: Left;  . Ligation of arteriovenous  fistula Left 03/18/2014    Procedure: LIGATION OF ARTERIOVENOUS  FISTULA- LEFT RADIOCEPHALIC;  Surgeon: Elam Dutch, MD;  Location: [facility];  Service: Vascular;  Laterality: Left;  Current Outpatient Prescriptions  Medication Sig Dispense Refill  . acetaminophen (TYLENOL) 500 MG tablet Take 1,000 mg by mouth 2 (two) times daily as needed (pain).     Marland Kitchen albuterol (PROVENTIL HFA;VENTOLIN HFA) 108 (90 BASE) MCG/ACT inhaler Inhale 2 puffs into the lungs every 4 (four) hours as needed for wheezing or shortness of breath.     Marland Kitchen albuterol (PROVENTIL) (2.5 MG/3ML) 0.083% nebulizer solution Take 2.5 mg by nebulization every 2 (two) hours as needed for wheezing or shortness of breath.    . Alpha-D-Galactosidase (BEANO PO) Take 1 tablet by mouth daily.    Marland Kitchen Apremilast (OTEZLA) 30 MG TABS Take 30 mg by mouth daily.     Marland Kitchen aspirin EC 81 MG tablet Take 81 mg by mouth daily.    Marland Kitchen atorvastatin  (LIPITOR) 20 MG tablet Take 20 mg by mouth at bedtime.    . calcium acetate (PHOSLO) 667 MG capsule Take 1 capsule (667 mg total) by mouth 3 (three) times daily with meals. 90 capsule 0  . cloNIDine (CATAPRES) 0.1 MG tablet Take 0.1 mg by mouth 2 (two) times daily.     Marland Kitchen dextromethorphan-guaiFENesin (MUCINEX DM) 30-600 MG per 12 hr tablet Take 1 tablet by mouth 2 (two) times daily.    Marland Kitchen esomeprazole (NEXIUM) 40 MG capsule Take 40 mg by mouth 2 (two) times daily before a meal.    . ferrous sulfate 325 (65 FE) MG tablet Take 325 mg by mouth daily with breakfast.    . fluocinonide cream (LIDEX) 4.09 % Apply 1 application topically 2 (two) times daily.   1  . fluticasone (FLONASE) 50 MCG/ACT nasal spray Place 2 sprays into both nostrils 2 (two) times daily as needed (congestion).     . Fluticasone-Salmeterol (ADVAIR) 250-50 MCG/DOSE AEPB Inhale 1 puff into the lungs 2 (two) times daily. 180 each 3  . furosemide (LASIX) 80 MG tablet Take 2 tablets (160 mg total) by mouth 2 (two) times daily. 120 tablet 0  . hydrALAZINE (APRESOLINE) 50 MG tablet Take 1 tablet (50 mg total) by mouth 3 (three) times daily. 90 tablet 0  . hydrOXYzine (ATARAX/VISTARIL) 25 MG tablet Take 25 mg by mouth every 3 (three) hours as needed for itching.     . Insulin Detemir (LEVEMIR) 100 UNIT/ML Pen Inject 35 Units into the skin 2 (two) times daily. 15 mL 11  . insulin lispro (HUMALOG KWIKPEN) 100 UNIT/ML KiwkPen Inject 0.1 mLs (10 Units total) into the skin 3 (three) times daily. Take 10 units  (plus additional 4-12 units based on sliding scale) with breakfast, 10 units (plus additional 4-12 units based on sliding scale) with and 10 units (+ plus additional 4-12 units based on sliding scale) with dinner.Inject 35-40 Units into the skin 3 (three) times daily. 15 mL 11  . ketorolac (ACULAR LS) 0.4 % SOLN Place 1 drop into both eyes 2 (two) times daily.     . Linaclotide (LINZESS) 290 MCG CAPS capsule Take 290 mcg by mouth every other  day.    Marland Kitchen LORazepam (ATIVAN) 1 MG tablet Take 1 mg by mouth at bedtime as needed for sleep.     . metoprolol succinate (TOPROL-XL) 25 MG 24 hr tablet Take 12.5 mg by mouth 2 (two) times daily.    . Multiple Vitamins-Minerals (MULTIVITAMIN WITH MINERALS) tablet Take 1 tablet by mouth daily.    . predniSONE (DELTASONE) 5 MG tablet Take 5 mg by mouth daily with breakfast.    . pregabalin (LYRICA) 100  MG capsule Take 100 mg by mouth at bedtime.     . Probiotic Product (PROBIOTIC PO) Take 1 capsule by mouth daily.     No current facility-administered medications for this visit.    Allergies:   Adhesive; Avelox; Codeine; Guaifenesin; Latex; and Oxycodone    Social History:  The patient  reports that she has never smoked. She has never used smokeless tobacco. She reports that she does not drink alcohol or use illicit drugs.   Family History:  The patient's family history includes Breast cancer in her maternal aunt; Cancer in her sister; Deep vein thrombosis in her father; Diabetes in her brother, mother, and sister; Heart attack in her brother and sister; Heart disease in her brother, father, mother, and sister; Hyperlipidemia in her brother, father, mother, sister, and sister; Varicose Veins in her mother.    ROS:  Please see the history of present illness.   Otherwise, review of systems are positive for none.   All other systems are reviewed and negative.    PHYSICAL EXAM: VS:  BP 130/48 mmHg  Pulse 79  Ht 5' 2.5" (1.588 m)  Wt 210 lb 6.4 oz (95.437 kg)  BMI 37.85 kg/m2  SpO2 97% , BMI Body mass index is 37.85 kg/(m^2). GEN: Well nourished, well developed, in no acute distress HEENT: normal Neck: no JVD or masses.  Bilateral carotid bruits which are very faint Cardiac: RRR; no murmurs, rubs, or gallops.  Trace edema bilaterally Respiratory:  clear to auscultation bilaterally, normal work of breathing GI: soft, nontender, nondistended, + BS MS: no deformity or atrophy Skin: warm and  dry, no rash Neuro:  Strength and sensation are intact Psych: euthymic mood, full affect   EKG:  EKG is not ordered today.    Recent Labs: 06/24/2014: Pro B Natriuretic peptide (BNP) 324.0* 08/30/2014: Magnesium 2.1 09/03/2014: B Natriuretic Peptide 1107.3* 09/15/2014: ALT 17; BUN 65*; Creatinine 3.50*; Hemoglobin 11.3*; Platelets 374; Potassium 3.5; Sodium 139    Lipid Panel    Component Value Date/Time   CHOL 116 06/24/2014 0945   TRIG 116.0 06/24/2014 0945   HDL 36.70* 06/24/2014 0945   CHOLHDL 3 06/24/2014 0945   VLDL 23.2 06/24/2014 0945   LDLCALC 56 06/24/2014 0945   LDLDIRECT 96.2 03/12/2010 1138      Wt Readings from Last 3 Encounters:  10/15/14 210 lb 6.4 oz (95.437 kg)  09/14/14 202 lb 1 oz (91.655 kg)  09/08/14 227 lb 1.6 oz (103.012 kg)    ASSESSMENT AND PLAN:  1. Chronic diastolic CHF - appears euvolemic on exam.  Continue Lasix/BB 2. HTN - controlled. Continue clonidine/Hydralazine/metoprolol. 3. ASCAD with small OM 70-90% stenosis with no other CAD. She has no angina.  Continue ASA/statin/Imdur/BB 4. COPD - per pulmonary 5. Dyslipidemia - continue statin. LDL at goal 6. Type II DM complicated by CKD - per PCP 7. CKD stage IV - per PCP 8. Sarcoidosis of the lung - per pulmonary 9. Bilateral carotid artery bruits - check carotid dopplers     Current medicines are reviewed at length with the patient today.  The patient does not have concerns regarding medicines.  The following changes have been made:  no change  Labs/ tests ordered today include: None   Orders Placed This Encounter  Procedures  . Carotid duplex     Disposition:   FU with me in 6 months   Signed, Sueanne Margarita, MD  10/15/2014 8:31 PM    [name]  [address] [place], [place]  [address] Phone: [phone]; Fax: [phone]

## 2014-10-15 ENCOUNTER — Ambulatory Visit (INDEPENDENT_AMBULATORY_CARE_PROVIDER_SITE_OTHER): Payer: Medicare Other | Admitting: Cardiology

## 2014-10-15 ENCOUNTER — Encounter: Payer: Self-pay | Admitting: Cardiology

## 2014-10-15 VITALS — BP 130/48 | HR 79 | Ht 62.5 in | Wt 210.4 lb

## 2014-10-15 DIAGNOSIS — Z8639 Personal history of other endocrine, nutritional and metabolic disease: Secondary | ICD-10-CM

## 2014-10-15 DIAGNOSIS — I2510 Atherosclerotic heart disease of native coronary artery without angina pectoris: Secondary | ICD-10-CM

## 2014-10-15 DIAGNOSIS — I5032 Chronic diastolic (congestive) heart failure: Secondary | ICD-10-CM

## 2014-10-15 DIAGNOSIS — R0989 Other specified symptoms and signs involving the circulatory and respiratory systems: Secondary | ICD-10-CM

## 2014-10-15 DIAGNOSIS — I10 Essential (primary) hypertension: Secondary | ICD-10-CM

## 2014-10-15 NOTE — Patient Instructions (Signed)
Your physician recommends that you continue on your current medications as directed. Please refer to the Current Medication list given to you today.  Your physician has requested that you have a carotid duplex. This test is an ultrasound of the carotid arteries in your neck. It looks at blood flow through these arteries that supply the brain with blood. Allow one hour for this exam. There are no restrictions or special instructions.  Your physician wants you to follow-up in: 6 months with Dr. Radford Pax. You will receive a reminder letter in the mail two months in advance. If you don't receive a letter, please call our office to schedule the follow-up appointment.

## 2014-10-16 ENCOUNTER — Ambulatory Visit (HOSPITAL_BASED_OUTPATIENT_CLINIC_OR_DEPARTMENT_OTHER): Payer: Medicare Other | Attending: Pulmonary Disease | Admitting: *Deleted

## 2014-10-16 VITALS — Ht 62.5 in | Wt 210.0 lb

## 2014-10-16 DIAGNOSIS — G4733 Obstructive sleep apnea (adult) (pediatric): Secondary | ICD-10-CM

## 2014-10-23 ENCOUNTER — Telehealth: Payer: Self-pay

## 2014-10-23 ENCOUNTER — Ambulatory Visit (HOSPITAL_COMMUNITY): Payer: Medicare Other | Attending: Cardiology | Admitting: *Deleted

## 2014-10-23 DIAGNOSIS — R0989 Other specified symptoms and signs involving the circulatory and respiratory systems: Secondary | ICD-10-CM | POA: Diagnosis present

## 2014-10-23 NOTE — Telephone Encounter (Signed)
-----   Message from Sueanne Margarita, MD sent at 10/23/2014 11:36 AM EDT ----- Normal study

## 2014-10-23 NOTE — Progress Notes (Signed)
Carotid Duplex Exam Performed 

## 2014-10-23 NOTE — Telephone Encounter (Signed)
Informed patient of carotid duplex results and verbal understanding expressed.    Patient st she is having increasing BLE swelling.  Confirmed she is taking her medications as directed. She st her socks are tight and have been for about a week. She has no other symptoms. No SOB. BP = 146/70  To Dr. Radford Pax for recommendations.

## 2014-10-24 NOTE — Telephone Encounter (Signed)
Have her take an extra Lasix 40mg  daily for 3 days in addition to her normal dosing and then go back to her normal dose.  Call if edema dose not resolve

## 2014-10-24 NOTE — Telephone Encounter (Signed)
Notified pt per Dr. Radford Pax to take an extra 40 mg Lasix for 3 days.  States she still has the LE swelling.  Advised to call us if swelling has reduced after the 3 days. She verbalizes understanding and repeated instructions back to me.

## 2014-10-27 ENCOUNTER — Telehealth: Payer: Self-pay | Admitting: Pulmonary Disease

## 2014-10-27 DIAGNOSIS — G4733 Obstructive sleep apnea (adult) (pediatric): Secondary | ICD-10-CM

## 2014-10-27 NOTE — Sleep Study (Signed)
[place]  NAME: [place] OF BIRTH:  01/07/1946 MEDICAL RECORD NUMBER [id number]  LOCATION: [facility]  PHYSICIAN: Chesley Mires, M.D. DATE OF STUDY: 10/16/2014  SLEEP STUDY TYPE: Nocturnal polysomnogram               REFERRING PHYSICIAN: Chesley Mires, MD  INDICATION FOR STUDY:  [name] is a 69 y.o. female who presents to the sleep lab for evaluation of hypersomnia with obstructive sleep apnea.  She reports snoring, sleep disruption, apnea, and daytime sleepiness.  She has hx of sarcoidosis, CAD, HTN, and chronic respiratory failure on 3 liters oxygen.  She has prior history of sleep apnea, but had nt been on therapy.  EPWORTH SLEEPINESS SCORE: 9. HEIGHT: 5' 2.5" (158.8 cm)  WEIGHT: 210 lb (95.255 kg)    Body mass index is 37.77 kg/(m^2).  NECK SIZE: 16 in.  MEDICATIONS:  Current Outpatient Prescriptions on File Prior to Visit  Medication Sig Dispense Refill  . acetaminophen (TYLENOL) 500 MG tablet Take 1,000 mg by mouth 2 (two) times daily as needed (pain).     Marland Kitchen albuterol (PROVENTIL HFA;VENTOLIN HFA) 108 (90 BASE) MCG/ACT inhaler Inhale 2 puffs into the lungs every 4 (four) hours as needed for wheezing or shortness of breath.     Marland Kitchen albuterol (PROVENTIL) (2.5 MG/3ML) 0.083% nebulizer solution Take 2.5 mg by nebulization every 2 (two) hours as needed for wheezing or shortness of breath.    . Alpha-D-Galactosidase (BEANO PO) Take 1 tablet by mouth daily.    Marland Kitchen Apremilast (OTEZLA) 30 MG TABS Take 30 mg by mouth daily.     Marland Kitchen aspirin EC 81 MG tablet Take 81 mg by mouth daily.    Marland Kitchen atorvastatin (LIPITOR) 20 MG tablet Take 20 mg by mouth at bedtime.    . calcium acetate (PHOSLO) 667 MG capsule Take 1 capsule (667 mg total) by mouth 3 (three) times daily with meals. 90 capsule 0  . cloNIDine (CATAPRES) 0.1 MG tablet Take 0.1 mg by mouth 2 (two) times daily.     Marland Kitchen dextromethorphan-guaiFENesin (MUCINEX DM) 30-600 MG per 12 hr  tablet Take 1 tablet by mouth 2 (two) times daily.    Marland Kitchen esomeprazole (NEXIUM) 40 MG capsule Take 40 mg by mouth 2 (two) times daily before a meal.    . ferrous sulfate 325 (65 FE) MG tablet Take 325 mg by mouth daily with breakfast.    . fluocinonide cream (LIDEX) 4.09 % Apply 1 application topically 2 (two) times daily.   1  . fluticasone (FLONASE) 50 MCG/ACT nasal spray Place 2 sprays into both nostrils 2 (two) times daily as needed (congestion).     . Fluticasone-Salmeterol (ADVAIR) 250-50 MCG/DOSE AEPB Inhale 1 puff into the lungs 2 (two) times daily. 180 each 3  . furosemide (LASIX) 80 MG tablet Take 2 tablets (160 mg total) by mouth 2 (two) times daily. 120 tablet 0  . hydrALAZINE (APRESOLINE) 50 MG tablet Take 1 tablet (50 mg total) by mouth 3 (three) times daily. 90 tablet 0  . hydrOXYzine (ATARAX/VISTARIL) 25 MG tablet Take 25 mg by mouth every 3 (three) hours as needed for itching.     . Insulin Detemir (LEVEMIR) 100 UNIT/ML Pen Inject 35 Units into the skin 2 (two) times daily. 15 mL 11  . insulin lispro (HUMALOG KWIKPEN) 100 UNIT/ML KiwkPen Inject 0.1 mLs (10 Units total) into the skin 3 (three) times daily. Take 10 units  (plus additional  4-12 units based on sliding scale) with breakfast, 10 units (plus additional 4-12 units based on sliding scale) with and 10 units (+ plus additional 4-12 units based on sliding scale) with dinner.Inject 35-40 Units into the skin 3 (three) times daily. 15 mL 11  . ketorolac (ACULAR LS) 0.4 % SOLN Place 1 drop into both eyes 2 (two) times daily.     . Linaclotide (LINZESS) 290 MCG CAPS capsule Take 290 mcg by mouth every other day.    Marland Kitchen LORazepam (ATIVAN) 1 MG tablet Take 1 mg by mouth at bedtime as needed for sleep.     . metoprolol succinate (TOPROL-XL) 25 MG 24 hr tablet Take 12.5 mg by mouth 2 (two) times daily.    . Multiple Vitamins-Minerals (MULTIVITAMIN WITH MINERALS) tablet Take 1 tablet by mouth daily.    . predniSONE (DELTASONE) 5 MG tablet  Take 5 mg by mouth daily with breakfast.    . pregabalin (LYRICA) 100 MG capsule Take 100 mg by mouth at bedtime.     . Probiotic Product (PROBIOTIC PO) Take 1 capsule by mouth daily.     No current facility-administered medications on file prior to visit.    SLEEP ARCHITECTURE:  Total recording time: 369.5 minutes.  Total sleep time was: 230.5 minutes.  Sleep efficiency: 62.4%.  Sleep latency: 28.5 minutes.  REM latency: 315.5 minutes.  Stage N1: 27.8%.  Stage N2: 69.8%.  Stage N3: 0%.  Stage R:  2.4%.  Supine sleep: 169.5 minutes.  Non-supine sleep: 61 minutes.  CARDIAC DATA:  Average heart rate: 65 beats per minute. Rhythm strip: sinus rhythm with PACs and PVCs.  RESPIRATORY DATA: Average respiratory rate: 16. Snoring: moderate. Average AHI: 74.2.   Apnea index: 20.6.  Hypopnea index: 53.6. Obstructive apnea index: 10.2.  Central apnea index: 7.5.  Mixed apnea index: 2.6. REM AHI: 109.1.  NREM AHI: 73.1. Supine AHI: 38.6. Non-supine AHI: 69.3.  MOVEMENT/PARASOMNIA:  Periodic limb movement: 0.  Period limb movements with arousals: 0. Restroom trips: one.  OXYGEN DATA:  Baseline oxygenation: 98%. Lowest SaO2: 73%. Time spent below SaO2 90%: 101.6 minutes. Supplemental oxygen used: None.  IMPRESSION/ RECOMMENDATION:   This study shows severe obstructive sleep apnea with an AHI of 74.2 and SaO2 low of 73%.   She should return to the sleep lab for a titration study.   Chesley Mires, M.D. Diplomate, [employer] of [employer]  ELECTRONICALLY SIGNED ON:  10/27/2014, 1:49 PM [place] PH: [phone]   FX: [phone] [place]

## 2014-10-27 NOTE — Telephone Encounter (Signed)
PSG 10/16/14 >> AHI 74.2, SaO2 low 73%  Will have my nurse inform pt that sleep study shows severe sleep apnea.  Options are 1) in lab CPAP titration and then ROV, or 2) ROV first.

## 2014-10-29 NOTE — Telephone Encounter (Signed)
Spoke with patient, aware of sleep study results.  Pt would like to proceed and to next test in sleep lab. Pt aware that someone will be in touch to schedule this.  Nothing further needed.

## 2014-11-04 ENCOUNTER — Telehealth: Payer: Self-pay | Admitting: Pulmonary Disease

## 2014-11-04 NOTE — Telephone Encounter (Signed)
Pt states that she has been out of town and never got a call from [facility] regarding her sleep study results. I went over her sleep study results with her again and she is aware that she has been scheduled for CPAP titration on 01-09-15 at 8:00pm. Pt is aware to call our office if any other questions.

## 2014-12-16 ENCOUNTER — Emergency Department (HOSPITAL_COMMUNITY)
Admission: EM | Admit: 2014-12-16 | Discharge: 2014-12-16 | Disposition: A | Discharge status: Home / Self Care | Payer: Medicare Other | Attending: Emergency Medicine | Admitting: Emergency Medicine

## 2014-12-16 ENCOUNTER — Encounter (HOSPITAL_COMMUNITY): Payer: Self-pay | Admitting: Emergency Medicine

## 2014-12-16 ENCOUNTER — Emergency Department (HOSPITAL_COMMUNITY): Payer: Medicare Other

## 2014-12-16 DIAGNOSIS — G4733 Obstructive sleep apnea (adult) (pediatric): Secondary | ICD-10-CM | POA: Insufficient documentation

## 2014-12-16 DIAGNOSIS — Z7951 Long term (current) use of inhaled steroids: Secondary | ICD-10-CM | POA: Diagnosis not present

## 2014-12-16 DIAGNOSIS — Z9889 Other specified postprocedural states: Secondary | ICD-10-CM | POA: Diagnosis not present

## 2014-12-16 DIAGNOSIS — Z79899 Other long term (current) drug therapy: Secondary | ICD-10-CM | POA: Insufficient documentation

## 2014-12-16 DIAGNOSIS — G8929 Other chronic pain: Secondary | ICD-10-CM | POA: Insufficient documentation

## 2014-12-16 DIAGNOSIS — I129 Hypertensive chronic kidney disease with stage 1 through stage 4 chronic kidney disease, or unspecified chronic kidney disease: Secondary | ICD-10-CM | POA: Insufficient documentation

## 2014-12-16 DIAGNOSIS — K219 Gastro-esophageal reflux disease without esophagitis: Secondary | ICD-10-CM | POA: Insufficient documentation

## 2014-12-16 DIAGNOSIS — Z9981 Dependence on supplemental oxygen: Secondary | ICD-10-CM | POA: Diagnosis not present

## 2014-12-16 DIAGNOSIS — R21 Rash and other nonspecific skin eruption: Secondary | ICD-10-CM | POA: Diagnosis not present

## 2014-12-16 DIAGNOSIS — J449 Chronic obstructive pulmonary disease, unspecified: Secondary | ICD-10-CM | POA: Insufficient documentation

## 2014-12-16 DIAGNOSIS — Z992 Dependence on renal dialysis: Secondary | ICD-10-CM | POA: Insufficient documentation

## 2014-12-16 DIAGNOSIS — Z794 Long term (current) use of insulin: Secondary | ICD-10-CM | POA: Insufficient documentation

## 2014-12-16 DIAGNOSIS — M1990 Unspecified osteoarthritis, unspecified site: Secondary | ICD-10-CM | POA: Diagnosis not present

## 2014-12-16 DIAGNOSIS — Z86010 Personal history of colonic polyps: Secondary | ICD-10-CM | POA: Insufficient documentation

## 2014-12-16 DIAGNOSIS — Z7982 Long term (current) use of aspirin: Secondary | ICD-10-CM | POA: Insufficient documentation

## 2014-12-16 DIAGNOSIS — Z862 Personal history of diseases of the blood and blood-forming organs and certain disorders involving the immune mechanism: Secondary | ICD-10-CM | POA: Insufficient documentation

## 2014-12-16 DIAGNOSIS — N184 Chronic kidney disease, stage 4 (severe): Secondary | ICD-10-CM | POA: Diagnosis not present

## 2014-12-16 DIAGNOSIS — R110 Nausea: Secondary | ICD-10-CM | POA: Diagnosis not present

## 2014-12-16 DIAGNOSIS — Z8709 Personal history of other diseases of the respiratory system: Secondary | ICD-10-CM | POA: Diagnosis not present

## 2014-12-16 DIAGNOSIS — I5032 Chronic diastolic (congestive) heart failure: Secondary | ICD-10-CM | POA: Insufficient documentation

## 2014-12-16 DIAGNOSIS — E119 Type 2 diabetes mellitus without complications: Secondary | ICD-10-CM | POA: Diagnosis not present

## 2014-12-16 DIAGNOSIS — Z7952 Long term (current) use of systemic steroids: Secondary | ICD-10-CM | POA: Diagnosis not present

## 2014-12-16 DIAGNOSIS — Z791 Long term (current) use of non-steroidal anti-inflammatories (NSAID): Secondary | ICD-10-CM | POA: Diagnosis not present

## 2014-12-16 DIAGNOSIS — F419 Anxiety disorder, unspecified: Secondary | ICD-10-CM | POA: Diagnosis not present

## 2014-12-16 DIAGNOSIS — R197 Diarrhea, unspecified: Secondary | ICD-10-CM | POA: Insufficient documentation

## 2014-12-16 DIAGNOSIS — R109 Unspecified abdominal pain: Secondary | ICD-10-CM | POA: Insufficient documentation

## 2014-12-16 DIAGNOSIS — E785 Hyperlipidemia, unspecified: Secondary | ICD-10-CM | POA: Insufficient documentation

## 2014-12-16 DIAGNOSIS — I2510 Atherosclerotic heart disease of native coronary artery without angina pectoris: Secondary | ICD-10-CM | POA: Insufficient documentation

## 2014-12-16 DIAGNOSIS — M10371 Gout due to renal impairment, right ankle and foot: Secondary | ICD-10-CM | POA: Diagnosis not present

## 2014-12-16 DIAGNOSIS — Z8701 Personal history of pneumonia (recurrent): Secondary | ICD-10-CM | POA: Diagnosis not present

## 2014-12-16 DIAGNOSIS — Z91040 Latex allergy status: Secondary | ICD-10-CM | POA: Diagnosis not present

## 2014-12-16 DIAGNOSIS — R52 Pain, unspecified: Secondary | ICD-10-CM

## 2014-12-16 LAB — COMPREHENSIVE METABOLIC PANEL
ALT: 16 U/L (ref 14–54)
AST: 18 U/L (ref 15–41)
Albumin: 2.8 g/dL — ABNORMAL LOW (ref 3.5–5.0)
Alkaline Phosphatase: 98 U/L (ref 38–126)
Anion gap: 10 (ref 5–15)
BUN: 45 mg/dL — ABNORMAL HIGH (ref 6–20)
CO2: 24 mmol/L (ref 22–32)
CREATININE: 3.05 mg/dL — AB (ref 0.44–1.00)
Calcium: 9.1 mg/dL (ref 8.9–10.3)
Chloride: 105 mmol/L (ref 101–111)
GFR calc non Af Amer: 15 mL/min — ABNORMAL LOW (ref >60)
GFR, EST AFRICAN AMERICAN: 17 mL/min — AB (ref >60)
GLUCOSE: 172 mg/dL — AB (ref 65–99)
Potassium: 3.5 mmol/L (ref 3.5–5.1)
SODIUM: 139 mmol/L (ref 135–145)
Total Bilirubin: 0.6 mg/dL (ref 0.3–1.2)
Total Protein: 5.7 g/dL — ABNORMAL LOW (ref 6.5–8.1)

## 2014-12-16 LAB — URINALYSIS, ROUTINE W REFLEX MICROSCOPIC
BILIRUBIN URINE: NEGATIVE
Glucose, UA: NEGATIVE mg/dL
Ketones, ur: NEGATIVE mg/dL
Leukocytes, UA: NEGATIVE
NITRITE: NEGATIVE
Protein, ur: >300 mg/dL — AB
SPECIFIC GRAVITY, URINE: 1.018 (ref 1.005–1.030)
Urobilinogen, UA: 0.2 mg/dL (ref 0.0–1.0)
pH: 5 (ref 5.0–8.0)

## 2014-12-16 LAB — CBC WITH DIFFERENTIAL/PLATELET
Basophils Absolute: 0 10*3/uL (ref 0.0–0.1)
Basophils Relative: 0 % (ref 0–1)
Eosinophils Absolute: 0.2 10*3/uL (ref 0.0–0.7)
Eosinophils Relative: 1 % (ref 0–5)
HCT: 33.1 % — ABNORMAL LOW (ref 36.0–46.0)
Hemoglobin: 10.7 g/dL — ABNORMAL LOW (ref 12.0–15.0)
LYMPHS PCT: 17 % (ref 12–46)
Lymphs Abs: 2 10*3/uL (ref 0.7–4.0)
MCH: 29.4 pg (ref 26.0–34.0)
MCHC: 32.3 g/dL (ref 30.0–36.0)
MCV: 90.9 fL (ref 78.0–100.0)
MONO ABS: 1 10*3/uL (ref 0.1–1.0)
MONOS PCT: 9 % (ref 3–12)
NEUTROS ABS: 8.3 10*3/uL — AB (ref 1.7–7.7)
Neutrophils Relative %: 73 % (ref 43–77)
Platelets: 465 10*3/uL — ABNORMAL HIGH (ref 150–400)
RBC: 3.64 MIL/uL — ABNORMAL LOW (ref 3.87–5.11)
RDW: 13.8 % (ref 11.5–15.5)
WBC: 11.5 10*3/uL — AB (ref 4.0–10.5)

## 2014-12-16 LAB — LIPASE, BLOOD: Lipase: 28 U/L (ref 22–51)

## 2014-12-16 LAB — URINE MICROSCOPIC-ADD ON

## 2014-12-16 LAB — I-STAT TROPONIN, ED: Troponin i, poc: 0.03 ng/mL (ref 0.00–0.08)

## 2014-12-16 MED ORDER — HYDROCODONE-ACETAMINOPHEN 5-325 MG PO TABS: 1.0000 | ORAL_TABLET | Freq: Four times a day (QID) | ORAL | Status: DC | PRN

## 2014-12-16 MED ORDER — IOHEXOL 300 MG/ML  SOLN
25.0000 mL | INTRAMUSCULAR | Status: AC
  Administered 2014-12-16 (×2): 25 mL via ORAL

## 2014-12-16 MED ORDER — LIDOCAINE HCL (CARDIAC) 20 MG/ML IV SOLN
INTRAVENOUS | Status: AC
  Filled 2014-12-16: qty 5

## 2014-12-16 MED ORDER — ETOMIDATE 2 MG/ML IV SOLN
INTRAVENOUS | Status: AC
  Filled 2014-12-16: qty 20

## 2014-12-16 MED ORDER — PROCHLORPERAZINE MALEATE 10 MG PO TABS
10.0000 mg | ORAL_TABLET | Freq: Four times a day (QID) | ORAL | Status: DC | PRN
Start: 2014-12-16 — End: 2015-01-01

## 2014-12-16 MED ORDER — ONDANSETRON HCL 4 MG/2ML IJ SOLN
4.0000 mg | Freq: Once | INTRAMUSCULAR | Status: AC
  Administered 2014-12-16: 4 mg via INTRAVENOUS
  Filled 2014-12-16: qty 2

## 2014-12-16 MED ORDER — PROCHLORPERAZINE EDISYLATE 5 MG/ML IJ SOLN
10.0000 mg | Freq: Once | INTRAMUSCULAR | Status: AC
  Administered 2014-12-16: 10 mg via INTRAVENOUS
  Filled 2014-12-16: qty 2

## 2014-12-16 MED ORDER — ROCURONIUM BROMIDE 50 MG/5ML IV SOLN
INTRAVENOUS | Status: AC
  Filled 2014-12-16: qty 2

## 2014-12-16 MED ORDER — METOCLOPRAMIDE HCL 5 MG/ML IJ SOLN
5.0000 mg | Freq: Once | INTRAMUSCULAR | Status: AC
  Administered 2014-12-16: 5 mg via INTRAVENOUS

## 2014-12-16 MED ORDER — METOCLOPRAMIDE HCL 5 MG/ML IJ SOLN
5.0000 mg | Freq: Once | INTRAMUSCULAR | Status: AC
  Administered 2014-12-16: 5 mg via INTRAVENOUS
  Filled 2014-12-16: qty 2

## 2014-12-16 MED ORDER — MORPHINE SULFATE 4 MG/ML IJ SOLN
4.0000 mg | Freq: Once | INTRAMUSCULAR | Status: AC
  Administered 2014-12-16: 4 mg via INTRAVENOUS
  Filled 2014-12-16: qty 1

## 2014-12-16 MED ORDER — SUCCINYLCHOLINE CHLORIDE 20 MG/ML IJ SOLN
INTRAMUSCULAR | Status: AC
  Filled 2014-12-16: qty 1

## 2014-12-16 NOTE — ED Provider Notes (Signed)
CSN: [id number]     Arrival date & time 12/16/14  42 History   First MD Initiated Contact with Patient 12/16/14 1053     Chief Complaint  Patient presents with  . Abdominal Pain  . Gout     (Consider location/radiation/quality/duration/timing/severity/associated sxs/prior Treatment) HPI complains of diffuse abdominal pain onset 3 days ago. With vomiting twice this morning and dry heaves. Also had 2 episodes of diarrhea this morning. Nonbloody nonbilious vomitus. Diarrhea watery. No fever. No treatment prior to coming here. Presently complains of mild nausea. Symptoms started after taking allopurinol for gouty attack in right foot which also started 3 days ago .Marland Kitchen She reports allopurinol slightly helped out that she's been advised not to take allopurinol by her physicians. She is instructed to take only Tylenol for gallop. No other associated symptoms. Nothing makes symptoms better or worse.  Past Medical History  Diagnosis Date  . Essential hypertension, benign   . Coronary atherosclerosis of native coronary artery 01/04/2006    Tiny OM1 70-90% ostial stenosis, no other CAD  . COPD (chronic obstructive pulmonary disease)   . Esophageal reflux   . Dyslipidemia   . Gout   . Colon polyps     Tubular adenomatous polyps  . Spinal stenosis of lumbar region   . Bulging lumbar disc   . Chronic diastolic heart failure   . PNA (pneumonia) 2012    With pleural effusion, requiring thoracentesis  . Chronic bronchitis   . On home oxygen therapy   . OSA (obstructive sleep apnea)     Marginally compliant with CPAP  . Type II diabetes mellitus   . Arthritis   . Chronic lower back pain   . Diverticulosis   . GERD (gastroesophageal reflux disease)   . Anxiety   . Hyperlipidemia   . Sarcoidosis of lung   . CKD (chronic kidney disease) stage 4, GFR 15-29 ml/min   . IBS (irritable bowel syndrome)   . Cataracts, both eyes   . Headache(784.0)   Eczema  Past Surgical History  Procedure Laterality  Date  . Vesicovaginal fistula closure w/  total abdominal hysterectomy  1992  . Bilateral total knee replacements Bilateral Rt=5/04 & Lft=1/09    by DrAlusio  . 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 Zella Richer  . Appendectomy  1957  . Tonsillectomy  1968  . Cholecystectomy  1980's  . Abdominal hysterectomy  1992  . Reduction mammaplasty Bilateral 1980's  . Cardiac catheterization  1990's  . Thoracentesis  2012  . Joint replacement    . Av fistula placement Left 12/31/2013    Procedure: ARTERIOVENOUS (AV) FISTULA CREATION;  Surgeon: Elam Dutch, MD;  Location: [facility];  Service: Vascular;  Laterality: Left;  . Colonoscopy w/ biopsies and polypectomy    . Av fistula placement Left 03/18/2014    Procedure: ARTERIOVENOUS (AV) FISTULA CREATION- LEFT BRACHIOCEPHALIC ;  Surgeon: Elam Dutch, MD;  Location: [facility] OR;  Service: Vascular;  Laterality: Left;  . Ligation of arteriovenous  fistula Left 03/18/2014    Procedure: LIGATION OF ARTERIOVENOUS  FISTULA- LEFT RADIOCEPHALIC;  Surgeon: Elam Dutch, MD;  Location: [facility] OR;  Service: Vascular;  Laterality: Left;   Family History  Problem Relation Age of Onset  . Diabetes Mother   . Heart disease Mother   . Hyperlipidemia Mother   . Varicose Veins Mother   . Breast cancer Maternal Aunt   . Heart disease Father   . Deep vein thrombosis Father   .  Hyperlipidemia Father   . Heart disease Sister     before age 45  . Cancer Sister   . Diabetes Sister   . Hyperlipidemia Sister   . Heart attack Sister   . Heart disease Brother   . Diabetes Brother   . Hyperlipidemia Brother   . Heart attack Brother   . Hyperlipidemia Sister    History  Substance Use Topics  . Smoking status: Never Smoker   . Smokeless tobacco: Never Used  . Alcohol Use: No   OB History    No data available     Review of Systems  Constitutional: Negative.   HENT: Negative.   Respiratory: Negative.   Cardiovascular: Negative.   Gastrointestinal: Positive for  nausea, vomiting, abdominal pain and diarrhea.  Musculoskeletal: Positive for arthralgias.       Right foot pain  Skin: Positive for rash.       Eczematous rash to both lower extremities  Neurological: Negative.   Psychiatric/Behavioral: Negative.   All other systems reviewed and are negative.     Allergies  Adhesive; Avelox; Codeine; Guaifenesin; Latex; and Oxycodone  Home Medications   Prior to Admission medications   Medication Sig Start Date End Date Taking? Authorizing Provider  acetaminophen (TYLENOL) 500 MG tablet Take 1,000 mg by mouth 2 (two) times daily as needed (pain).     Historical Provider, MD  albuterol (PROVENTIL HFA;VENTOLIN HFA) 108 (90 BASE) MCG/ACT inhaler Inhale 2 puffs into the lungs every 4 (four) hours as needed for wheezing or shortness of breath.     Historical Provider, MD  albuterol (PROVENTIL) (2.5 MG/3ML) 0.083% nebulizer solution Take 2.5 mg by nebulization every 2 (two) hours as needed for wheezing or shortness of breath.    Historical Provider, MD  Alpha-D-Galactosidase (BEANO PO) Take 1 tablet by mouth daily.    Historical Provider, MD  Apremilast (OTEZLA) 30 MG TABS Take 30 mg by mouth daily.     Historical Provider, MD  aspirin EC 81 MG tablet Take 81 mg by mouth daily.    Historical Provider, MD  atorvastatin (LIPITOR) 20 MG tablet Take 20 mg by mouth at bedtime.    Historical Provider, MD  calcium acetate (PHOSLO) 667 MG capsule Take 1 capsule (667 mg total) by mouth 3 (three) times daily with meals. 09/09/14   Silver Huguenin Elgergawy, MD  cloNIDine (CATAPRES) 0.1 MG tablet Take 0.1 mg by mouth 2 (two) times daily.  02/12/14   Historical Provider, MD  dextromethorphan-guaiFENesin (MUCINEX DM) 30-600 MG per 12 hr tablet Take 1 tablet by mouth 2 (two) times daily.    Historical Provider, MD  esomeprazole (NEXIUM) 40 MG capsule Take 40 mg by mouth 2 (two) times daily before a meal.    Historical Provider, MD  ferrous sulfate 325 (65 FE) MG tablet Take 325 mg  by mouth daily with breakfast.    Historical Provider, MD  fluocinonide cream (LIDEX) 2.35 % Apply 1 application topically 2 (two) times daily.  08/14/14   Historical Provider, MD  fluticasone (FLONASE) 50 MCG/ACT nasal spray Place 2 sprays into both nostrils 2 (two) times daily as needed (congestion).     Historical Provider, MD  Fluticasone-Salmeterol (ADVAIR) 250-50 MCG/DOSE AEPB Inhale 1 puff into the lungs 2 (two) times daily. 09/25/14   Noralee Space, MD  furosemide (LASIX) 80 MG tablet Take 2 tablets (160 mg total) by mouth 2 (two) times daily. 09/09/14   Silver Huguenin Elgergawy, MD  hydrALAZINE (APRESOLINE) 50 MG tablet Take  1 tablet (50 mg total) by mouth 3 (three) times daily. 09/09/14   Albertine Patricia, MD  hydrOXYzine (ATARAX/VISTARIL) 25 MG tablet Take 25 mg by mouth every 3 (three) hours as needed for itching.     Historical Provider, MD  Insulin Detemir (LEVEMIR) 100 UNIT/ML Pen Inject 35 Units into the skin 2 (two) times daily. 09/09/14   Silver Huguenin Elgergawy, MD  insulin lispro (HUMALOG KWIKPEN) 100 UNIT/ML KiwkPen Inject 0.1 mLs (10 Units total) into the skin 3 (three) times daily. Take 10 units  (plus additional 4-12 units based on sliding scale) with breakfast, 10 units (plus additional 4-12 units based on sliding scale) with and 10 units (+ plus additional 4-12 units based on sliding scale) with dinner.Inject 35-40 Units into the skin 3 (three) times daily. 09/09/14   Silver Huguenin Elgergawy, MD  ketorolac (ACULAR LS) 0.4 % SOLN Place 1 drop into both eyes 2 (two) times daily.     Historical Provider, MD  Linaclotide Rolan Lipa) 290 MCG CAPS capsule Take 290 mcg by mouth every other day. 06/14/13   Irene Shipper, MD  LORazepam (ATIVAN) 1 MG tablet Take 1 mg by mouth at bedtime as needed for sleep.     Historical Provider, MD  metoprolol succinate (TOPROL-XL) 25 MG 24 hr tablet Take 12.5 mg by mouth 2 (two) times daily.    Historical Provider, MD  Multiple Vitamins-Minerals (MULTIVITAMIN WITH MINERALS)  tablet Take 1 tablet by mouth daily.    Historical Provider, MD  predniSONE (DELTASONE) 5 MG tablet Take 5 mg by mouth daily with breakfast.    Historical Provider, MD  pregabalin (LYRICA) 100 MG capsule Take 100 mg by mouth at bedtime.     Historical Provider, MD  Probiotic Product (PROBIOTIC PO) Take 1 capsule by mouth daily.    Historical Provider, MD   BP 162/92 mmHg  Pulse 57  Temp(Src) 97.7 F (36.5 C) (Oral)  Resp 14  SpO2 100% Physical Exam  Constitutional: She appears well-developed and well-nourished.  HENT:  Head: Normocephalic and atraumatic.  Eyes: Conjunctivae are normal. Pupils are equal, round, and reactive to light.  Neck: Neck supple. No tracheal deviation present. No thyromegaly present.  Cardiovascular: Normal rate and regular rhythm.   No murmur heard. Pulmonary/Chest: Effort normal and breath sounds normal.  Abdominal: Soft. Bowel sounds are normal. She exhibits no distension. There is tenderness.  Obese diffusely tender  Musculoskeletal: Normal range of motion. She exhibits no edema or tenderness.  Right upper extremity with dialysis fistula with good thrill. Right lower extremity eczematous rash at lower leg. Mildly swollen and tender over dorsum of foot. DP pulse 2+. Left lower extremity with eczematous rash over lower leg. Neurovascular intact  Neurological: She is alert. Coordination normal.  Skin: Skin is warm and dry. No rash noted.  Psychiatric: She has a normal mood and affect.  Nursing note and vitals reviewed.   ED Course  Procedures (including critical care time) Labs Review Labs Reviewed  COMPREHENSIVE METABOLIC PANEL  CBC WITH DIFFERENTIAL/PLATELET  LIPASE, BLOOD  URINALYSIS, ROUTINE W REFLEX MICROSCOPIC  I-STAT TROPOININ, ED    Imaging Review No results found.   EKG Interpretation   Date/Time:  Monday Dec 16 2014 11:52:06 EDT Ventricular Rate:  54 PR Interval:  147 QRS Duration: 150 QT Interval:  516 QTC Calculation: 489 R  Axis:   115 Text Interpretation:  Sinus or ectopic atrial rhythm RBBB and LPFB No  significant change since last tracing Confirmed by Winfred Leeds  MD, Inocente Salles  [phone]) on 12/16/2014 12:44:07 PM     12:40 PM pain and nausea improved after treatment with intravenous opioids and antiemetics  12:55 PM patient requesting additional antiemetics. Pain is controlled.  4:40 PM pain and nausea controlled after treatment with antiemetics. She is able to drink without difficulty. Results for orders placed or performed during the hospital encounter of 12/16/14  Comprehensive metabolic panel  Result Value Ref Range   Sodium 139 135 - 145 mmol/L   Potassium 3.5 3.5 - 5.1 mmol/L   Chloride 105 101 - 111 mmol/L   CO2 24 22 - 32 mmol/L   Glucose, Bld 172 (H) 65 - 99 mg/dL   BUN 45 (H) 6 - 20 mg/dL   Creatinine, Ser 3.05 (H) 0.44 - 1.00 mg/dL   Calcium 9.1 8.9 - 10.3 mg/dL   Total Protein 5.7 (L) 6.5 - 8.1 g/dL   Albumin 2.8 (L) 3.5 - 5.0 g/dL   AST 18 15 - 41 U/L   ALT 16 14 - 54 U/L   Alkaline Phosphatase 98 38 - 126 U/L   Total Bilirubin 0.6 0.3 - 1.2 mg/dL   GFR calc non Af Amer 15 (L) >60 mL/min   GFR calc Af Amer 17 (L) >60 mL/min   Anion gap 10 5 - 15  CBC with Differential/Platelet  Result Value Ref Range   WBC 11.5 (H) 4.0 - 10.5 K/uL   RBC 3.64 (L) 3.87 - 5.11 MIL/uL   Hemoglobin 10.7 (L) 12.0 - 15.0 g/dL   HCT 33.1 (L) 36.0 - 46.0 %   MCV 90.9 78.0 - 100.0 fL   MCH 29.4 26.0 - 34.0 pg   MCHC 32.3 30.0 - 36.0 g/dL   RDW 13.8 11.5 - 15.5 %   Platelets 465 (H) 150 - 400 K/uL   Neutrophils Relative % 73 43 - 77 %   Neutro Abs 8.3 (H) 1.7 - 7.7 K/uL   Lymphocytes Relative 17 12 - 46 %   Lymphs Abs 2.0 0.7 - 4.0 K/uL   Monocytes Relative 9 3 - 12 %   Monocytes Absolute 1.0 0.1 - 1.0 K/uL   Eosinophils Relative 1 0 - 5 %   Eosinophils Absolute 0.2 0.0 - 0.7 K/uL   Basophils Relative 0 0 - 1 %   Basophils Absolute 0.0 0.0 - 0.1 K/uL  Lipase, blood  Result Value Ref Range   Lipase 28  22 - 51 U/L  Urinalysis, Routine w reflex microscopic  Result Value Ref Range   Color, Urine YELLOW YELLOW   APPearance CLOUDY (A) CLEAR   Specific Gravity, Urine 1.018 1.005 - 1.030   pH 5.0 5.0 - 8.0   Glucose, UA NEGATIVE NEGATIVE mg/dL   Hgb urine dipstick TRACE (A) NEGATIVE   Bilirubin Urine NEGATIVE NEGATIVE   Ketones, ur NEGATIVE NEGATIVE mg/dL   Protein, ur >300 (A) NEGATIVE mg/dL   Urobilinogen, UA 0.2 0.0 - 1.0 mg/dL   Nitrite NEGATIVE NEGATIVE   Leukocytes, UA NEGATIVE NEGATIVE  Urine microscopic-add on  Result Value Ref Range   Squamous Epithelial / LPF MANY (A) RARE   WBC, UA 0-2 <3 WBC/hpf   RBC / HPF 0-2 <3 RBC/hpf   Bacteria, UA FEW (A) RARE  I-stat troponin, ED  Result Value Ref Range   Troponin i, poc 0.03 0.00 - 0.08 ng/mL   Comment 3           Ct Abdomen Pelvis Wo Contrast  12/16/2014   CLINICAL DATA:  Vomiting x2 since 0 4/scratch the vomiting x2 since 12/15/2014. Diffuse abdominal pain.  EXAM: CT ABDOMEN AND PELVIS WITHOUT CONTRAST  TECHNIQUE: Multidetector CT imaging of the abdomen and pelvis was performed following the standard protocol without IV contrast.  COMPARISON:  CT abdomen and pelvis 05/12/2014, 08/25/2013 and 07/21/2012.  FINDINGS: Cardiomegaly. No pleural or pericardial effusion is identified. Mild atelectatic change is seen in the lung bases.  The patient is status post cholecystectomy. Partially calcified low attenuating lesion the posterior right hepatic lobe measures 1.3 cm, unchanged. Subtle nodularity of the liver border along the left hepatic lobe is noted. No focal liver lesion is identified.  The adrenal glands, pancreas and kidneys are unremarkable. 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  The patient is status post hysterectomy. A few scattered diverticula are noted there is no evidence of diverticulitis. Soft tissue nodules in the left upper quadrant the abdomen along the descending colon are consistent with splenosis and unchanged. Cutaneous tissues the  anterior abdominal wall appears indurated, unchanged. No lymphadenopathy or fluid is identified. No focal bony abnormality is seen. Lower lumbar degenerative change is noted.  IMPRESSION: No acute finding abdomen or pelvis.  Status post cholecystectomy, splenectomy and hysterectomy.  Cardiomegaly.  Subtle nodularity of the liver border suggestive of cirrhosis.   Electronically Signed   By: Inge Rise M.D.   On: 12/16/2014 14:49    MDM  Plan prescription Norco, Compazine Dx #1 acute gout #2 hyperglycemia  #3 abdominal pain #4 nausea, vomiting and diarrhea Final diagnoses:  Pain        Orlie Dakin, MD 12/16/14 1650

## 2014-12-16 NOTE — ED Notes (Signed)
Patient transported to Ultrasound 

## 2014-12-16 NOTE — ED Notes (Signed)
Pt reports vomiting x 2 since Friday and diffuse belly pain. Gout in R foot. States she doesn't feel like anything settles on stomach. Took aliprionol for gout on Friday but isnt supposed to take due to renal insufficiency. Began vomiting and not feeling well after that.  Graft in place for planned dialysis.

## 2014-12-16 NOTE — Discharge Instructions (Signed)
Take Tylenol for mild pain or the pain medicine prescribed for bad pain. You've also been prescribed medicine for nausea to take as needed. See your doctor [facility] if continuing not feeling better in 2 or 3 days.   Return if you're unable to hold down fluids without vomiting after taking the medicine prescribed or if you feel worse for any reason

## 2014-12-16 NOTE — ED Notes (Signed)
Patient returned from CT

## 2014-12-16 NOTE — ED Notes (Signed)
Contrast at bedside; pt encouraged to call when finished.

## 2014-12-19 ENCOUNTER — Encounter: Payer: Self-pay | Admitting: Internal Medicine

## 2014-12-21 ENCOUNTER — Emergency Department (HOSPITAL_COMMUNITY): Payer: Medicare Other

## 2014-12-21 ENCOUNTER — Emergency Department (HOSPITAL_COMMUNITY)
Admission: EM | Admit: 2014-12-21 | Discharge: 2014-12-21 | Disposition: A | Discharge status: Home / Self Care | Payer: Medicare Other | Attending: Emergency Medicine | Admitting: Emergency Medicine

## 2014-12-21 DIAGNOSIS — Z9889 Other specified postprocedural states: Secondary | ICD-10-CM | POA: Diagnosis not present

## 2014-12-21 DIAGNOSIS — E785 Hyperlipidemia, unspecified: Secondary | ICD-10-CM | POA: Diagnosis not present

## 2014-12-21 DIAGNOSIS — F419 Anxiety disorder, unspecified: Secondary | ICD-10-CM | POA: Insufficient documentation

## 2014-12-21 DIAGNOSIS — G4733 Obstructive sleep apnea (adult) (pediatric): Secondary | ICD-10-CM | POA: Diagnosis not present

## 2014-12-21 DIAGNOSIS — K589 Irritable bowel syndrome without diarrhea: Secondary | ICD-10-CM | POA: Diagnosis not present

## 2014-12-21 DIAGNOSIS — Z91040 Latex allergy status: Secondary | ICD-10-CM | POA: Insufficient documentation

## 2014-12-21 DIAGNOSIS — K219 Gastro-esophageal reflux disease without esophagitis: Secondary | ICD-10-CM | POA: Insufficient documentation

## 2014-12-21 DIAGNOSIS — I2510 Atherosclerotic heart disease of native coronary artery without angina pectoris: Secondary | ICD-10-CM | POA: Diagnosis not present

## 2014-12-21 DIAGNOSIS — N184 Chronic kidney disease, stage 4 (severe): Secondary | ICD-10-CM | POA: Insufficient documentation

## 2014-12-21 DIAGNOSIS — Z86010 Personal history of colonic polyps: Secondary | ICD-10-CM | POA: Insufficient documentation

## 2014-12-21 DIAGNOSIS — Z791 Long term (current) use of non-steroidal anti-inflammatories (NSAID): Secondary | ICD-10-CM | POA: Diagnosis not present

## 2014-12-21 DIAGNOSIS — Z8701 Personal history of pneumonia (recurrent): Secondary | ICD-10-CM | POA: Diagnosis not present

## 2014-12-21 DIAGNOSIS — M1990 Unspecified osteoarthritis, unspecified site: Secondary | ICD-10-CM | POA: Diagnosis not present

## 2014-12-21 DIAGNOSIS — Z7951 Long term (current) use of inhaled steroids: Secondary | ICD-10-CM | POA: Diagnosis not present

## 2014-12-21 DIAGNOSIS — R109 Unspecified abdominal pain: Secondary | ICD-10-CM

## 2014-12-21 DIAGNOSIS — Z9981 Dependence on supplemental oxygen: Secondary | ICD-10-CM | POA: Diagnosis not present

## 2014-12-21 DIAGNOSIS — Z79899 Other long term (current) drug therapy: Secondary | ICD-10-CM | POA: Diagnosis not present

## 2014-12-21 DIAGNOSIS — Z7952 Long term (current) use of systemic steroids: Secondary | ICD-10-CM | POA: Diagnosis not present

## 2014-12-21 DIAGNOSIS — G8929 Other chronic pain: Secondary | ICD-10-CM | POA: Insufficient documentation

## 2014-12-21 DIAGNOSIS — J449 Chronic obstructive pulmonary disease, unspecified: Secondary | ICD-10-CM | POA: Insufficient documentation

## 2014-12-21 DIAGNOSIS — Z7982 Long term (current) use of aspirin: Secondary | ICD-10-CM | POA: Insufficient documentation

## 2014-12-21 DIAGNOSIS — E119 Type 2 diabetes mellitus without complications: Secondary | ICD-10-CM | POA: Insufficient documentation

## 2014-12-21 DIAGNOSIS — I129 Hypertensive chronic kidney disease with stage 1 through stage 4 chronic kidney disease, or unspecified chronic kidney disease: Secondary | ICD-10-CM | POA: Diagnosis not present

## 2014-12-21 DIAGNOSIS — Z862 Personal history of diseases of the blood and blood-forming organs and certain disorders involving the immune mechanism: Secondary | ICD-10-CM | POA: Insufficient documentation

## 2014-12-21 DIAGNOSIS — I5032 Chronic diastolic (congestive) heart failure: Secondary | ICD-10-CM | POA: Diagnosis not present

## 2014-12-21 LAB — COMPREHENSIVE METABOLIC PANEL
ALT: 15 U/L (ref 14–54)
AST: 15 U/L (ref 15–41)
Albumin: 2.7 g/dL — ABNORMAL LOW (ref 3.5–5.0)
Alkaline Phosphatase: 103 U/L (ref 38–126)
Anion gap: 11 (ref 5–15)
BUN: 34 mg/dL — ABNORMAL HIGH (ref 6–20)
CO2: 24 mmol/L (ref 22–32)
Calcium: 8.8 mg/dL — ABNORMAL LOW (ref 8.9–10.3)
Chloride: 105 mmol/L (ref 101–111)
Creatinine, Ser: 2.8 mg/dL — ABNORMAL HIGH (ref 0.44–1.00)
GFR calc Af Amer: 19 mL/min — ABNORMAL LOW (ref >60)
GFR calc non Af Amer: 17 mL/min — ABNORMAL LOW (ref >60)
Glucose, Bld: 152 mg/dL — ABNORMAL HIGH (ref 65–99)
Potassium: 3.2 mmol/L — ABNORMAL LOW (ref 3.5–5.1)
Sodium: 140 mmol/L (ref 135–145)
Total Bilirubin: 0.4 mg/dL (ref 0.3–1.2)
Total Protein: 5.5 g/dL — ABNORMAL LOW (ref 6.5–8.1)

## 2014-12-21 LAB — I-STAT CG4 LACTIC ACID, ED: Lactic Acid, Venous: 1.09 mmol/L (ref 0.5–2.0)

## 2014-12-21 LAB — CBC WITH DIFFERENTIAL/PLATELET
Basophils Absolute: 0 10*3/uL (ref 0.0–0.1)
Basophils Relative: 0 % (ref 0–1)
Eosinophils Absolute: 0.3 10*3/uL (ref 0.0–0.7)
Eosinophils Relative: 3 % (ref 0–5)
HCT: 35.2 % — ABNORMAL LOW (ref 36.0–46.0)
Hemoglobin: 11.2 g/dL — ABNORMAL LOW (ref 12.0–15.0)
Lymphocytes Relative: 19 % (ref 12–46)
Lymphs Abs: 1.7 10*3/uL (ref 0.7–4.0)
MCH: 29.2 pg (ref 26.0–34.0)
MCHC: 31.8 g/dL (ref 30.0–36.0)
MCV: 91.7 fL (ref 78.0–100.0)
Monocytes Absolute: 1 10*3/uL (ref 0.1–1.0)
Monocytes Relative: 11 % (ref 3–12)
Neutro Abs: 5.9 10*3/uL (ref 1.7–7.7)
Neutrophils Relative %: 67 % (ref 43–77)
Platelets: 505 10*3/uL — ABNORMAL HIGH (ref 150–400)
RBC: 3.84 MIL/uL — ABNORMAL LOW (ref 3.87–5.11)
RDW: 14.5 % (ref 11.5–15.5)
WBC: 8.9 10*3/uL (ref 4.0–10.5)

## 2014-12-21 LAB — URINALYSIS, ROUTINE W REFLEX MICROSCOPIC
Bilirubin Urine: NEGATIVE
Glucose, UA: NEGATIVE mg/dL
Hgb urine dipstick: NEGATIVE
Ketones, ur: NEGATIVE mg/dL
Leukocytes, UA: NEGATIVE
Nitrite: NEGATIVE
Protein, ur: >300 mg/dL — AB
Specific Gravity, Urine: 1.012 (ref 1.005–1.030)
Urobilinogen, UA: 0.2 mg/dL (ref 0.0–1.0)
pH: 5.5 (ref 5.0–8.0)

## 2014-12-21 LAB — URINE MICROSCOPIC-ADD ON

## 2014-12-21 MED ORDER — FENTANYL CITRATE (PF) 100 MCG/2ML IJ SOLN
50.0000 ug | Freq: Once | INTRAMUSCULAR | Status: AC
  Administered 2014-12-21: 50 ug via INTRAVENOUS
  Filled 2014-12-21: qty 2

## 2014-12-21 MED ORDER — IOHEXOL 300 MG/ML  SOLN
25.0000 mL | Freq: Once | INTRAMUSCULAR | Status: AC | PRN
  Administered 2014-12-21: 25 mL via ORAL

## 2014-12-21 MED ORDER — FENTANYL CITRATE (PF) 100 MCG/2ML IJ SOLN
100.0000 ug | Freq: Once | INTRAMUSCULAR | Status: AC
Start: 2014-12-21 — End: 2014-12-21
  Administered 2014-12-21: 100 ug via INTRAVENOUS
  Filled 2014-12-21: qty 2

## 2014-12-21 MED ORDER — HYDROCODONE-ACETAMINOPHEN 5-325 MG PO TABS: 1.0000 | ORAL_TABLET | Freq: Four times a day (QID) | ORAL | Status: DC | PRN

## 2014-12-21 MED ORDER — MORPHINE SULFATE 4 MG/ML IJ SOLN
4.0000 mg | Freq: Once | INTRAMUSCULAR | Status: DC
  Filled 2014-12-21: qty 1

## 2014-12-21 MED ORDER — ONDANSETRON HCL 4 MG/2ML IJ SOLN
4.0000 mg | Freq: Once | INTRAMUSCULAR | Status: AC
  Administered 2014-12-21: 4 mg via INTRAVENOUS
  Filled 2014-12-21: qty 2

## 2014-12-21 NOTE — ED Notes (Signed)
Pt. Was here on Monday with n/v/d.  She was sent home and reports that the symptoms have not improved,  Pt. Reports having 3 loose stools since last night , denies any blood in them.  Pt. Is afebrile.  GCS 15. Skin is warm and dry.  Pt. Reports that she vomited X1,  Pt. Has a new fistula in her rt. Arm, has not started dialysis.

## 2014-12-21 NOTE — ED Notes (Signed)
Pt is in stable condition upon d/c and is escorted from ED by staff via wheelchair.

## 2014-12-21 NOTE — Discharge Instructions (Signed)
Return here as needed.  Follow-up with her primary care doctor.   Abdominal Pain Many things can cause abdominal pain. Usually, abdominal pain is not caused by a disease and will improve without treatment. It can often be observed and treated at home. Your health care provider will do a physical exam and possibly order blood tests and X-rays to help determine the seriousness of your pain. However, in many cases, more time must pass before a clear cause of the pain can be found. Before that point, your health care provider may not know if you need more testing or further treatment. HOME CARE INSTRUCTIONS  Monitor your abdominal pain for any changes. The following actions may help to alleviate any discomfort you are experiencing:  Only take over-the-counter or prescription medicines as directed by your health care provider.  Do not take laxatives unless directed to do so by your health care provider.  Try a clear liquid diet (broth, tea, or water) as directed by your health care provider. Slowly move to a bland diet as tolerated. SEEK MEDICAL CARE IF:  You have unexplained abdominal pain.  You have abdominal pain associated with nausea or diarrhea.  You have pain when you urinate or have a bowel movement.  You experience abdominal pain that wakes you in the night.  You have abdominal pain that is worsened or improved by eating food.  You have abdominal pain that is worsened with eating fatty foods.  You have a fever. SEEK IMMEDIATE MEDICAL CARE IF:   Your pain does not go away within 2 hours.  You keep throwing up (vomiting).  Your pain is felt only in portions of the abdomen, such as the right side or the left lower portion of the abdomen.  You pass bloody or black tarry stools. MAKE SURE YOU:  Understand these instructions.   Will watch your condition.   Will get help right away if you are not doing well or get worse.  Document Released: 04/28/2005 Document Revised:  07/24/2013 Document Reviewed: 03/28/2013 [facility] Patient Information 2015 [place], [place]. This information is not intended to replace advice given to you by your health care provider. Make sure you discuss any questions you have with your health care provider.

## 2014-12-21 NOTE — ED Provider Notes (Signed)
CSN: [id number]     Arrival date & time 12/21/14  5732 History   First MD Initiated Contact with Patient 12/21/14 [phone]     Chief Complaint  Patient presents with  . Abdominal Pain     (Consider location/radiation/quality/duration/timing/severity/associated sxs/prior Treatment) HPI Patient presents to the emergency department with continued deep diffuse abdominal pain across her entire abdomen.  The patient states she was here 5 days ago with similar symptoms.  Patient states she had a CT scan done at that time without any abnormality.  The patient states she has had abdominal pain similar to this in the past.  The patient states that it seems to not be getting any better following her visit to the emergency department.  The patient states that she does not have any chest pain, short of breath, weakness, dizziness, headache, blurred vision, back pain, neck pain, cough, runny nose, sore throat, fever, rash, lightheadedness, near syncope or syncope.  Patient states she has had some nausea and vomiting but has not vomited in over a day and a half.  The patient states she has had some diarrhea as well.  Patient states nothing.  She has tried has helped with her symptoms other than the Compazine for her nausea and vomiting Past Medical History  Diagnosis Date  . Essential hypertension, benign   . Coronary atherosclerosis of native coronary artery 01/04/2006    Tiny OM1 70-90% ostial stenosis, no other CAD  . COPD (chronic obstructive pulmonary disease)   . Esophageal reflux   . Dyslipidemia   . Gout   . Colon polyps     Tubular adenomatous polyps  . Spinal stenosis of lumbar region   . Bulging lumbar disc   . Chronic diastolic heart failure   . PNA (pneumonia) 2012    With pleural effusion, requiring thoracentesis  . Chronic bronchitis   . On home oxygen therapy   . OSA (obstructive sleep apnea)     Marginally compliant with CPAP  . Type II diabetes mellitus   . Arthritis   . Chronic lower  back pain   . Diverticulosis   . GERD (gastroesophageal reflux disease)   . Anxiety   . Hyperlipidemia   . Sarcoidosis of lung   . CKD (chronic kidney disease) stage 4, GFR 15-29 ml/min   . IBS (irritable bowel syndrome)   . Cataracts, both eyes   . KYHCWCBJ(628.3)    Past Surgical History  Procedure Laterality Date  . Vesicovaginal fistula closure w/  total abdominal hysterectomy  1992  . Bilateral total knee replacements Bilateral Rt=5/04 & Lft=1/09    by DrAlusio  . 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 Zella Richer  . Appendectomy  1957  . Tonsillectomy  1968  . Cholecystectomy  1980's  . Abdominal hysterectomy  1992  . Reduction mammaplasty Bilateral 1980's  . Cardiac catheterization  1990's  . Thoracentesis  2012  . Joint replacement    . Av fistula placement Left 12/31/2013    Procedure: ARTERIOVENOUS (AV) FISTULA CREATION;  Surgeon: Elam Dutch, MD;  Location: [facility];  Service: Vascular;  Laterality: Left;  . Colonoscopy w/ biopsies and polypectomy    . Av fistula placement Left 03/18/2014    Procedure: ARTERIOVENOUS (AV) FISTULA CREATION- LEFT BRACHIOCEPHALIC ;  Surgeon: Elam Dutch, MD;  Location: [facility] OR;  Service: Vascular;  Laterality: Left;  . Ligation of arteriovenous  fistula Left 03/18/2014    Procedure: LIGATION OF ARTERIOVENOUS  FISTULA- LEFT RADIOCEPHALIC;  Surgeon: Elam Dutch, MD;  Location: [facility] OR;  Service: Vascular;  Laterality: Left;   Family History  Problem Relation Age of Onset  . Diabetes Mother   . Heart disease Mother   . Hyperlipidemia Mother   . Varicose Veins Mother   . Breast cancer Maternal Aunt   . Heart disease Father   . Deep vein thrombosis Father   . Hyperlipidemia Father   . Heart disease Sister     before age 84  . Cancer Sister   . Diabetes Sister   . Hyperlipidemia Sister   . Heart attack Sister   . Heart disease Brother   . Diabetes Brother   . Hyperlipidemia Brother   . Heart attack Brother   . Hyperlipidemia  Sister    History  Substance Use Topics  . Smoking status: Never Smoker   . Smokeless tobacco: Never Used  . Alcohol Use: No   OB History    No data available     Review of Systems  All other systems negative except as documented in the HPI. All pertinent positives and negatives as reviewed in the HPI.  Allergies  Adhesive; Avelox; Codeine; Guaifenesin; Latex; and Oxycodone  Home Medications   Prior to Admission medications   Medication Sig Start Date End Date Taking? Authorizing Provider  acetaminophen (TYLENOL) 500 MG tablet Take 1,000 mg by mouth 2 (two) times daily as needed (pain).    Yes Historical Provider, MD  albuterol (PROVENTIL HFA;VENTOLIN HFA) 108 (90 BASE) MCG/ACT inhaler Inhale 2 puffs into the lungs every 4 (four) hours as needed for wheezing or shortness of breath.    Yes Historical Provider, MD  albuterol (PROVENTIL) (2.5 MG/3ML) 0.083% nebulizer solution Take 2.5 mg by nebulization every 2 (two) hours as needed for wheezing or shortness of breath.   Yes Historical Provider, MD  Alpha-D-Galactosidase (BEANO PO) Take 1 tablet by mouth daily.   Yes Historical Provider, MD  Apremilast (OTEZLA) 30 MG TABS Take 30 mg by mouth daily.    Yes Historical Provider, MD  aspirin EC 81 MG tablet Take 81 mg by mouth daily.   Yes Historical Provider, MD  atorvastatin (LIPITOR) 20 MG tablet Take 20 mg by mouth at bedtime.   Yes Historical Provider, MD  esomeprazole (NEXIUM) 40 MG capsule Take 40 mg by mouth 2 (two) times daily before a meal.   Yes Historical Provider, MD  ferrous sulfate 325 (65 FE) MG tablet Take 325 mg by mouth daily with breakfast.   Yes Historical Provider, MD  Fluticasone-Salmeterol (ADVAIR) 250-50 MCG/DOSE AEPB Inhale 1 puff into the lungs 2 (two) times daily. 09/25/14  Yes Noralee Space, MD  furosemide (LASIX) 80 MG tablet Take 2 tablets (160 mg total) by mouth 2 (two) times daily. 09/09/14  Yes Albertine Patricia, MD  hydrALAZINE (APRESOLINE) 50 MG tablet  Take 1 tablet (50 mg total) by mouth 3 (three) times daily. 09/09/14  Yes Albertine Patricia, MD  HYDROcodone-acetaminophen (NORCO) 5-325 MG per tablet Take 1-2 tablets by mouth every 6 (six) hours as needed for severe pain. 12/16/14  Yes Orlie Dakin, MD  Insulin Glargine 300 UNIT/ML SOPN Inject 80 Units into the skin daily.   Yes Historical Provider, MD  insulin lispro (HUMALOG KWIKPEN) 100 UNIT/ML KiwkPen Inject 0.1 mLs (10 Units total) into the skin 3 (three) times daily. Take 10 units  (plus additional 4-12 units based on sliding scale) with breakfast, 10 units (plus additional 4-12 units based on sliding scale)  with and 10 units (+ plus additional 4-12 units based on sliding scale) with dinner.Inject 35-40 Units into the skin 3 (three) times daily. 09/09/14  Yes Albertine Patricia, MD  ketorolac (ACULAR LS) 0.4 % SOLN Place 1 drop into both eyes 2 (two) times daily.    Yes Historical Provider, MD  Linaclotide (LINZESS) 290 MCG CAPS capsule Take 290 mcg by mouth every other day. 06/14/13  Yes Irene Shipper, MD  LORazepam (ATIVAN) 1 MG tablet Take 1 mg by mouth at bedtime as needed for sleep.    Yes Historical Provider, MD  metolazone (ZAROXOLYN) 5 MG tablet Take 5 mg by mouth daily as needed.   Yes Historical Provider, MD  metoprolol succinate (TOPROL-XL) 25 MG 24 hr tablet Take 12.5 mg by mouth 2 (two) times daily.   Yes Historical Provider, MD  Multiple Vitamins-Minerals (MULTIVITAMIN WITH MINERALS) tablet Take 1 tablet by mouth daily.   Yes Historical Provider, MD  predniSONE (DELTASONE) 5 MG tablet Take 5 mg by mouth daily with breakfast.   Yes Historical Provider, MD  pregabalin (LYRICA) 100 MG capsule Take 100 mg by mouth at bedtime.    Yes Historical Provider, MD  Probiotic Product (PROBIOTIC PO) Take 1 capsule by mouth daily.   Yes Historical Provider, MD  prochlorperazine (COMPAZINE) 10 MG tablet Take 1 tablet (10 mg total) by mouth every 6 (six) hours as needed for nausea or vomiting. 12/16/14   Yes Orlie Dakin, MD  promethazine (PHENERGAN) 25 MG tablet Take 25 mg by mouth every 6 (six) hours as needed for nausea or vomiting.   Yes Historical Provider, MD  triamcinolone cream (KENALOG) 0.1 % Apply 1 application topically 2 (two) times daily.   Yes Historical Provider, MD  calcium acetate (PHOSLO) 667 MG capsule Take 1 capsule (667 mg total) by mouth 3 (three) times daily with meals. Patient not taking: Reported on 12/16/2014 09/09/14   Silver Huguenin Elgergawy, MD  Insulin Detemir (LEVEMIR) 100 UNIT/ML Pen Inject 35 Units into the skin 2 (two) times daily. Patient not taking: Reported on 12/16/2014 09/09/14   Silver Huguenin Elgergawy, MD   BP 172/69 mmHg  Pulse 58  Temp(Src) 97.7 F (36.5 C)  Resp 18  Ht 5' 2.5" (1.588 m)  Wt 208 lb (94.348 kg)  BMI 37.41 kg/m2  SpO2 98% Physical Exam  Constitutional: She is oriented to person, place, and time. She appears well-developed and well-nourished.  HENT:  Head: Normocephalic and atraumatic.  Mouth/Throat: Oropharynx is clear and moist.  Eyes: Pupils are equal, round, and reactive to light.  Neck: Normal range of motion. Neck supple.  Cardiovascular: Normal rate, regular rhythm, normal heart sounds and intact distal pulses.  Exam reveals no gallop and no friction rub.   No murmur heard. Pulmonary/Chest: Effort normal and breath sounds normal. No respiratory distress. She has no wheezes.  Abdominal: Soft. Bowel sounds are normal. She exhibits no distension. There is tenderness. There is no rebound and no guarding.  Neurological: She is alert and oriented to person, place, and time. She exhibits normal muscle tone. Coordination normal.  Skin: Skin is warm and dry. No rash noted. No erythema.  Psychiatric: She has a normal mood and affect. Her behavior is normal.  Nursing note and vitals reviewed.   ED Course  Procedures (including critical care time) Labs Review Labs Reviewed  COMPREHENSIVE METABOLIC PANEL - Abnormal; Notable for the  following:    Potassium 3.2 (*)    Glucose, Bld 152 (*)  BUN 34 (*)    Creatinine, Ser 2.80 (*)    Calcium 8.8 (*)    Total Protein 5.5 (*)    Albumin 2.7 (*)    GFR calc non Af Amer 17 (*)    GFR calc Af Amer 19 (*)    All other components within normal limits  CBC WITH DIFFERENTIAL/PLATELET - Abnormal; Notable for the following:    RBC 3.84 (*)    Hemoglobin 11.2 (*)    HCT 35.2 (*)    Platelets 505 (*)    All other components within normal limits  URINALYSIS, ROUTINE W REFLEX MICROSCOPIC - Abnormal; Notable for the following:    Protein, ur >300 (*)    All other components within normal limits  URINE MICROSCOPIC-ADD ON  I-STAT CG4 LACTIC ACID, ED    Imaging Review Ct Abdomen Pelvis Wo Contrast  12/21/2014   CLINICAL DATA:  Acute onset of generalized abdominal pain, nausea, vomiting and diarrhea. Initial encounter.  EXAM: CT ABDOMEN AND PELVIS WITHOUT CONTRAST  TECHNIQUE: Multidetector CT imaging of the abdomen and pelvis was performed following the standard protocol without IV contrast.  COMPARISON:  CT of the abdomen and pelvis performed 12/16/2014  FINDINGS: Mild bibasilar bronchiectasis is noted, with mild associated scarring on the right. Scattered coronary artery calcifications are seen.  A partially calcified 2.0 cm hypoattenuating focus near the hepatic dome is stable in appearance from prior studies and likely benign. The spleen is absent. The patient is status post cholecystectomy, with clips noted along the gallbladder fossa. The pancreas and adrenal glands are unremarkable.  The kidneys are unremarkable in appearance. There is no evidence of hydronephrosis. No renal or ureteral stones are seen. Nonspecific perinephric stranding is noted bilaterally.  No free fluid is identified. The small bowel is unremarkable in appearance. The stomach is within normal limits. No acute vascular abnormalities are seen. Mild calcification is noted along the abdominal aorta and its branches.   The patient is status post appendectomy. Scattered small soft tissue densities along the descending colon are thought to reflect splenules. Scattered diverticulosis is noted along the distal descending and proximal sigmoid colon, without evidence of diverticulitis. The colon is otherwise unremarkable in appearance.  The bladder is mildly distended and grossly unremarkable. The patient is status post hysterectomy. The ovaries are relatively symmetric. No suspicious adnexal masses are seen.  Chronic anterior soft tissue inflammation is noted along the abdominal wall. No inguinal lymphadenopathy is seen.  No acute osseous abnormalities are identified. Vacuum phenomenon is noted at L5-S1. Underlying facet disease is noted. There is minimal disc space narrowing at L3-L4.  IMPRESSION: 1. No acute abnormality seen within the abdomen or pelvis. 2. Scattered mild diverticulosis along the distal descending and proximal sigmoid colon, without evidence of diverticulitis. 3. Mild bibasilar bronchiectasis, with mild right basilar scarring. 4. Scattered coronary artery calcifications seen. 5. Mild calcification along the abdominal aorta and its branches. 6. Likely benign partially calcified 2.0 cm hypoattenuating focus near the hepatic dome, stable in appearance. 7. Chronic anterior soft tissue inflammation noted along the abdominal wall.   Electronically Signed   By: Garald Balding M.D.   On: 12/21/2014 17:32   Dg Abd Acute W/chest  12/21/2014   CLINICAL DATA:  69 year old female with pain across mid and lower abdomen to the past 3 days accompanied by nausea and diarrhea. Clinical history includes IBS.  EXAM: DG ABDOMEN ACUTE W/ 1V CHEST  COMPARISON:  Prior CT abdomen/ pelvis 12/16/2014  FINDINGS: Borderline cardiomegaly. Atherosclerotic  calcification present in the transverse aorta. Pulmonary vascular congestion without overt edema. No focal airspace consolidation.  No evidence of free air or obstruction. Surgical clips in  the right upper quadrant and left upper quadrant. No organomegaly or abnormal calcification. Lower lumbar degenerative disc disease and left greater than right facet arthropathy. Bones appear slightly osteopenic. No lytic or blastic osseous lesion.  IMPRESSION: 1. Unremarkable, nonobstructed bowel gas pattern. 2. Cardiomegaly. 3. Aortic atherosclerosis. 4. Lower lumbar degenerative disc disease and facet arthropathy.   Electronically Signed   By: Jacqulynn Cadet M.D.   On: 12/21/2014 10:55    Patient has diffuse abdominal pain noted on exam.  I reexamined her and she continues to have diffuse abdominal pain that is fairly significant.  We will re-CT scan her abdomen.  Plain films did not show any abnormality   Dalia Heading, PA-C 12/22/14 0732  Jola Schmidt, MD 12/23/14 [phone]

## 2014-12-31 ENCOUNTER — Encounter (HOSPITAL_COMMUNITY): Payer: Self-pay

## 2014-12-31 ENCOUNTER — Emergency Department (HOSPITAL_COMMUNITY)
Admission: EM | Admit: 2014-12-31 | Discharge: 2014-12-31 | Disposition: A | Discharge status: Home / Self Care | Payer: Medicare Other | Attending: Emergency Medicine | Admitting: Emergency Medicine

## 2014-12-31 ENCOUNTER — Emergency Department (HOSPITAL_COMMUNITY): Payer: Medicare Other

## 2014-12-31 DIAGNOSIS — G4733 Obstructive sleep apnea (adult) (pediatric): Secondary | ICD-10-CM | POA: Insufficient documentation

## 2014-12-31 DIAGNOSIS — E785 Hyperlipidemia, unspecified: Secondary | ICD-10-CM | POA: Insufficient documentation

## 2014-12-31 DIAGNOSIS — E119 Type 2 diabetes mellitus without complications: Secondary | ICD-10-CM | POA: Diagnosis not present

## 2014-12-31 DIAGNOSIS — Z794 Long term (current) use of insulin: Secondary | ICD-10-CM | POA: Diagnosis not present

## 2014-12-31 DIAGNOSIS — R112 Nausea with vomiting, unspecified: Secondary | ICD-10-CM

## 2014-12-31 DIAGNOSIS — H269 Unspecified cataract: Secondary | ICD-10-CM | POA: Diagnosis not present

## 2014-12-31 DIAGNOSIS — Z86010 Personal history of colonic polyps: Secondary | ICD-10-CM | POA: Insufficient documentation

## 2014-12-31 DIAGNOSIS — I129 Hypertensive chronic kidney disease with stage 1 through stage 4 chronic kidney disease, or unspecified chronic kidney disease: Secondary | ICD-10-CM | POA: Diagnosis not present

## 2014-12-31 DIAGNOSIS — K219 Gastro-esophageal reflux disease without esophagitis: Secondary | ICD-10-CM | POA: Diagnosis not present

## 2014-12-31 DIAGNOSIS — Z8701 Personal history of pneumonia (recurrent): Secondary | ICD-10-CM | POA: Insufficient documentation

## 2014-12-31 DIAGNOSIS — Z91040 Latex allergy status: Secondary | ICD-10-CM | POA: Insufficient documentation

## 2014-12-31 DIAGNOSIS — Z862 Personal history of diseases of the blood and blood-forming organs and certain disorders involving the immune mechanism: Secondary | ICD-10-CM | POA: Insufficient documentation

## 2014-12-31 DIAGNOSIS — E876 Hypokalemia: Secondary | ICD-10-CM | POA: Diagnosis not present

## 2014-12-31 DIAGNOSIS — M1990 Unspecified osteoarthritis, unspecified site: Secondary | ICD-10-CM | POA: Diagnosis not present

## 2014-12-31 DIAGNOSIS — Z79899 Other long term (current) drug therapy: Secondary | ICD-10-CM | POA: Diagnosis not present

## 2014-12-31 DIAGNOSIS — F419 Anxiety disorder, unspecified: Secondary | ICD-10-CM | POA: Diagnosis not present

## 2014-12-31 DIAGNOSIS — J449 Chronic obstructive pulmonary disease, unspecified: Secondary | ICD-10-CM | POA: Diagnosis not present

## 2014-12-31 DIAGNOSIS — I2510 Atherosclerotic heart disease of native coronary artery without angina pectoris: Secondary | ICD-10-CM | POA: Insufficient documentation

## 2014-12-31 DIAGNOSIS — Z7951 Long term (current) use of inhaled steroids: Secondary | ICD-10-CM | POA: Diagnosis not present

## 2014-12-31 DIAGNOSIS — Z9981 Dependence on supplemental oxygen: Secondary | ICD-10-CM | POA: Insufficient documentation

## 2014-12-31 DIAGNOSIS — N184 Chronic kidney disease, stage 4 (severe): Secondary | ICD-10-CM | POA: Insufficient documentation

## 2014-12-31 DIAGNOSIS — R1084 Generalized abdominal pain: Secondary | ICD-10-CM | POA: Diagnosis present

## 2014-12-31 DIAGNOSIS — R197 Diarrhea, unspecified: Secondary | ICD-10-CM | POA: Diagnosis not present

## 2014-12-31 DIAGNOSIS — Z7982 Long term (current) use of aspirin: Secondary | ICD-10-CM | POA: Insufficient documentation

## 2014-12-31 DIAGNOSIS — G8929 Other chronic pain: Secondary | ICD-10-CM | POA: Diagnosis not present

## 2014-12-31 DIAGNOSIS — R109 Unspecified abdominal pain: Secondary | ICD-10-CM

## 2014-12-31 LAB — COMPREHENSIVE METABOLIC PANEL
ALK PHOS: 137 U/L — AB (ref 38–126)
ALT: 13 U/L — AB (ref 14–54)
AST: 23 U/L (ref 15–41)
Albumin: 2.5 g/dL — ABNORMAL LOW (ref 3.5–5.0)
Anion gap: 11 (ref 5–15)
BUN: 5 mg/dL — ABNORMAL LOW (ref 6–20)
CALCIUM: 7.8 mg/dL — AB (ref 8.9–10.3)
CO2: 25 mmol/L (ref 22–32)
Chloride: 105 mmol/L (ref 101–111)
Creatinine, Ser: 2.27 mg/dL — ABNORMAL HIGH (ref 0.44–1.00)
GFR calc Af Amer: 25 mL/min — ABNORMAL LOW (ref >60)
GFR, EST NON AFRICAN AMERICAN: 21 mL/min — AB (ref >60)
GLUCOSE: 54 mg/dL — AB (ref 65–99)
POTASSIUM: 2.9 mmol/L — AB (ref 3.5–5.1)
SODIUM: 141 mmol/L (ref 135–145)
Total Bilirubin: 0.6 mg/dL (ref 0.3–1.2)
Total Protein: 5.2 g/dL — ABNORMAL LOW (ref 6.5–8.1)

## 2014-12-31 LAB — CBC WITH DIFFERENTIAL/PLATELET
Basophils Absolute: 0.1 10*3/uL (ref 0.0–0.1)
Basophils Relative: 1 % (ref 0–1)
Eosinophils Absolute: 0.3 10*3/uL (ref 0.0–0.7)
Eosinophils Relative: 3 % (ref 0–5)
HCT: 42.1 % (ref 36.0–46.0)
HEMOGLOBIN: 13.4 g/dL (ref 12.0–15.0)
Lymphocytes Relative: 29 % (ref 12–46)
Lymphs Abs: 2.5 10*3/uL (ref 0.7–4.0)
MCH: 28.9 pg (ref 26.0–34.0)
MCHC: 31.8 g/dL (ref 30.0–36.0)
MCV: 90.7 fL (ref 78.0–100.0)
MONOS PCT: 23 % — AB (ref 3–12)
Monocytes Absolute: 2 10*3/uL — ABNORMAL HIGH (ref 0.1–1.0)
NEUTROS ABS: 3.9 10*3/uL (ref 1.7–7.7)
Neutrophils Relative %: 44 % (ref 43–77)
Platelets: 351 10*3/uL (ref 150–400)
RBC: 4.64 MIL/uL (ref 3.87–5.11)
RDW: 14 % (ref 11.5–15.5)
WBC: 8.8 10*3/uL (ref 4.0–10.5)

## 2014-12-31 LAB — CBG MONITORING, ED
GLUCOSE-CAPILLARY: 51 mg/dL — AB (ref 65–99)
GLUCOSE-CAPILLARY: 148 mg/dL — AB (ref 65–99)
Glucose-Capillary: 66 mg/dL (ref 65–99)

## 2014-12-31 LAB — LIPASE, BLOOD: LIPASE: 13 U/L — AB (ref 22–51)

## 2014-12-31 MED ORDER — ONDANSETRON HCL 4 MG/2ML IJ SOLN
4.0000 mg | Freq: Once | INTRAMUSCULAR | Status: AC
  Administered 2014-12-31: 4 mg via INTRAVENOUS
  Filled 2014-12-31: qty 2

## 2014-12-31 MED ORDER — ONDANSETRON HCL 4 MG PO TABS
4.0000 mg | ORAL_TABLET | Freq: Once | ORAL | Status: AC
  Administered 2014-12-31: 4 mg via ORAL
  Filled 2014-12-31: qty 1

## 2014-12-31 MED ORDER — GI COCKTAIL ~~LOC~~
30.0000 mL | Freq: Once | ORAL | Status: AC
  Administered 2014-12-31: 30 mL via ORAL
  Filled 2014-12-31: qty 30

## 2014-12-31 MED ORDER — DEXTROSE 50 % IV SOLN
1.0000 | Freq: Once | INTRAVENOUS | Status: AC
  Administered 2014-12-31: 50 mL via INTRAVENOUS
  Filled 2014-12-31: qty 50

## 2014-12-31 MED ORDER — ONDANSETRON HCL 4 MG PO TABS: 4.0000 mg | ORAL_TABLET | Freq: Three times a day (TID) | ORAL | Status: DC | PRN

## 2014-12-31 MED ORDER — POTASSIUM CHLORIDE CRYS ER 20 MEQ PO TBCR
40.0000 meq | EXTENDED_RELEASE_TABLET | Freq: Once | ORAL | Status: AC
  Administered 2014-12-31: 40 meq via ORAL
  Filled 2014-12-31: qty 2

## 2014-12-31 NOTE — Discharge Instructions (Signed)

## 2014-12-31 NOTE — ED Provider Notes (Signed)
CSN: [id number]     Arrival date & time 12/31/14  9628 History   First MD Initiated Contact with Patient 12/31/14 0827     Chief Complaint  Patient presents with  . Emesis  . Abdominal Pain     (Consider location/radiation/quality/duration/timing/severity/associated sxs/prior Treatment) HPI Comments: 69 year old female history of IBS comes in with complaints of abdominal pain.states she's having diffuse abdominal pain, appears to be worse in the epigastric region. His been present for several weeks. She was seen recently for similar with negative workup. Instructed to go to GI clinic. Unable to get appointment for approximately 1 month. Continues to have abdominal pain, nausea, vomiting and occasional diarrhea.  Patient is a 69 y.o. female presenting with abdominal pain.  Abdominal Pain Pain location:  Generalized Pain quality: aching   Pain radiates to:  Does not radiate Pain severity:  Moderate Onset quality:  Gradual Timing:  Intermittent Progression:  Waxing and waning Context comment:  Not known Associated symptoms: diarrhea, nausea and vomiting   Associated symptoms: no fever and no shortness of breath     Past Medical History  Diagnosis Date  . Essential hypertension, benign   . Coronary atherosclerosis of native coronary artery 01/04/2006    Tiny OM1 70-90% ostial stenosis, no other CAD  . COPD (chronic obstructive pulmonary disease)   . Esophageal reflux   . Dyslipidemia   . Gout   . Colon polyps     Tubular adenomatous polyps  . Spinal stenosis of lumbar region   . Bulging lumbar disc   . Chronic diastolic heart failure   . PNA (pneumonia) 2012    With pleural effusion, requiring thoracentesis  . Chronic bronchitis   . On home oxygen therapy   . OSA (obstructive sleep apnea)     Marginally compliant with CPAP  . Type II diabetes mellitus   . Arthritis   . Chronic lower back pain   . Diverticulosis   . GERD (gastroesophageal reflux disease)   . Anxiety   .  Hyperlipidemia   . Sarcoidosis of lung   . CKD (chronic kidney disease) stage 4, GFR 15-29 ml/min   . IBS (irritable bowel syndrome)   . Cataracts, both eyes   . ZMOQHUTM(546.5)    Past Surgical History  Procedure Laterality Date  . Vesicovaginal fistula closure w/  total abdominal hysterectomy  1992  . Bilateral total knee replacements Bilateral Rt=5/04 & Lft=1/09    by DrAlusio  . 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 Zella Richer  . Appendectomy  1957  . Tonsillectomy  1968  . Cholecystectomy  1980's  . Abdominal hysterectomy  1992  . Reduction mammaplasty Bilateral 1980's  . Cardiac catheterization  1990's  . Thoracentesis  2012  . Joint replacement    . Av fistula placement Left 12/31/2013    Procedure: ARTERIOVENOUS (AV) FISTULA CREATION;  Surgeon: Elam Dutch, MD;  Location: [facility];  Service: Vascular;  Laterality: Left;  . Colonoscopy w/ biopsies and polypectomy    . Av fistula placement Left 03/18/2014    Procedure: ARTERIOVENOUS (AV) FISTULA CREATION- LEFT BRACHIOCEPHALIC ;  Surgeon: Elam Dutch, MD;  Location: [facility] OR;  Service: Vascular;  Laterality: Left;  . Ligation of arteriovenous  fistula Left 03/18/2014    Procedure: LIGATION OF ARTERIOVENOUS  FISTULA- LEFT RADIOCEPHALIC;  Surgeon: Elam Dutch, MD;  Location: [facility] OR;  Service: Vascular;  Laterality: Left;   Family History  Problem Relation Age of Onset  . Diabetes Mother   .  Heart disease Mother   . Hyperlipidemia Mother   . Varicose Veins Mother   . Breast cancer Maternal Aunt   . Heart disease Father   . Deep vein thrombosis Father   . Hyperlipidemia Father   . Heart disease Sister     before age 17  . Cancer Sister   . Diabetes Sister   . Hyperlipidemia Sister   . Heart attack Sister   . Heart disease Brother   . Diabetes Brother   . Hyperlipidemia Brother   . Heart attack Brother   . Hyperlipidemia Sister    History  Substance Use Topics  . Smoking status: Never Smoker   . Smokeless  tobacco: Never Used  . Alcohol Use: No   OB History    No data available     Review of Systems  Constitutional: Negative for fever.  HENT: Negative for congestion.   Respiratory: Negative for shortness of breath.   Gastrointestinal: Positive for nausea, vomiting, abdominal pain and diarrhea.  Genitourinary: Negative for difficulty urinating.  Musculoskeletal: Negative for arthralgias.  Skin: Negative for rash.  Neurological: Negative for headaches.  Psychiatric/Behavioral: Negative for confusion.  All other systems reviewed and are negative.     Allergies  Adhesive; Avelox; Codeine; Guaifenesin; Latex; and Oxycodone  Home Medications   Prior to Admission medications   Medication Sig Start Date End Date Taking? Authorizing Provider  acetaminophen (TYLENOL) 500 MG tablet Take 1,000 mg by mouth 2 (two) times daily as needed (pain).    Yes Historical Provider, MD  albuterol (PROVENTIL HFA;VENTOLIN HFA) 108 (90 BASE) MCG/ACT inhaler Inhale 2 puffs into the lungs every 4 (four) hours as needed for wheezing or shortness of breath.    Yes Historical Provider, MD  albuterol (PROVENTIL) (2.5 MG/3ML) 0.083% nebulizer solution Take 2.5 mg by nebulization every 2 (two) hours as needed for wheezing or shortness of breath.   Yes Historical Provider, MD  Alpha-D-Galactosidase (BEANO PO) Take 1 tablet by mouth daily.   Yes Historical Provider, MD  Apremilast (OTEZLA) 30 MG TABS Take 30 mg by mouth daily.    Yes Historical Provider, MD  aspirin EC 81 MG tablet Take 81 mg by mouth daily.   Yes Historical Provider, MD  atorvastatin (LIPITOR) 20 MG tablet Take 20 mg by mouth at bedtime.   Yes Historical Provider, MD  ciprofloxacin (CIPRO) 250 MG tablet Take 250 mg by mouth 2 (two) times daily. for 10 days 12/24/14  Yes Historical Provider, MD  clotrimazole-betamethasone (LOTRISONE) cream Apply 1 application topically 2 (two) times daily as needed (eczema).  01/18/14  Yes Historical Provider, MD   esomeprazole (NEXIUM) 40 MG capsule Take 40 mg by mouth daily at 12 noon.   Yes Historical Provider, MD  ferrous sulfate 325 (65 FE) MG tablet Take 325 mg by mouth daily with breakfast.   Yes Historical Provider, MD  fluocinonide cream (LIDEX) 7.78 % Apply 1 application topically 2 (two) times daily.   Yes Historical Provider, MD  fluticasone (FLONASE) 50 MCG/ACT nasal spray Place 2 sprays into both nostrils at bedtime.   Yes Historical Provider, MD  Fluticasone-Salmeterol (ADVAIR) 250-50 MCG/DOSE AEPB Inhale 1 puff into the lungs 2 (two) times daily. 09/25/14  Yes Noralee Space, MD  furosemide (LASIX) 80 MG tablet Take 2 tablets (160 mg total) by mouth 2 (two) times daily. 09/09/14  Yes Albertine Patricia, MD  hydrALAZINE (APRESOLINE) 50 MG tablet Take 1 tablet (50 mg total) by mouth 3 (three) times daily. 09/09/14  Yes Albertine Patricia, MD  HYDROcodone-acetaminophen (NORCO/VICODIN) 5-325 MG per tablet Take 1 tablet by mouth every 6 (six) hours as needed for moderate pain. 12/21/14  Yes Christopher Lawyer, PA-C  Insulin Glargine 300 UNIT/ML SOPN Inject 70 Units into the skin daily.   Yes Historical Provider, MD  insulin lispro (HUMALOG KWIKPEN) 100 UNIT/ML KiwkPen Inject 0.1 mLs (10 Units total) into the skin 3 (three) times daily. Take 10 units  (plus additional 4-12 units based on sliding scale) with breakfast, 10 units (plus additional 4-12 units based on sliding scale) with and 10 units (+ plus additional 4-12 units based on sliding scale) with dinner.Inject 35-40 Units into the skin 3 (three) times daily. 09/09/14  Yes Albertine Patricia, MD  Linaclotide (LINZESS) 290 MCG CAPS capsule Take 290 mcg by mouth every other day. 06/14/13  Yes Irene Shipper, MD  LORazepam (ATIVAN) 1 MG tablet Take 1 mg by mouth at bedtime as needed for sleep.    Yes Historical Provider, MD  metolazone (ZAROXOLYN) 5 MG tablet Take 5 mg by mouth daily as needed.   Yes Historical Provider, MD  metoprolol succinate (TOPROL-XL) 25  MG 24 hr tablet Take 12.5 mg by mouth 2 (two) times daily.   Yes Historical Provider, MD  metroNIDAZOLE (FLAGYL) 250 MG tablet Take 250 mg by mouth 2 (two) times daily. 12/24/14  Yes Historical Provider, MD  Multiple Vitamins-Minerals (MULTIVITAMIN WITH MINERALS) tablet Take 1 tablet by mouth daily.   Yes Historical Provider, MD  [facility] [place] DELICA LANCETS 78H MISC 1 each 4 (four) times daily. 09/20/12  Yes Historical Provider, MD  pregabalin (LYRICA) 100 MG capsule Take 100 mg by mouth at bedtime.    Yes Historical Provider, MD  Probiotic Product (PROBIOTIC PO) Take 1 capsule by mouth daily.   Yes Historical Provider, MD  prochlorperazine (COMPAZINE) 10 MG tablet Take 1 tablet (10 mg total) by mouth every 6 (six) hours as needed for nausea or vomiting. 12/16/14  Yes Orlie Dakin, MD  promethazine (PHENERGAN) 25 MG tablet Take 25 mg by mouth every 6 (six) hours as needed for nausea or vomiting.   Yes Historical Provider, MD  triamcinolone cream (KENALOG) 0.1 % Apply 1 application topically 2 (two) times daily.   Yes Historical Provider, MD  ondansetron (ZOFRAN) 4 MG tablet Take 1 tablet (4 mg total) by mouth every 8 (eight) hours as needed for nausea or vomiting. 12/31/14   Robynn Pane, MD   BP 119/53 mmHg  Pulse 81  Temp(Src) 97.9 F (36.6 C) (Oral)  Resp 20  SpO2 97% Physical Exam  Constitutional: She is oriented to person, place, and time. She appears well-developed.  HENT:  Head: Normocephalic.  Eyes: Pupils are equal, round, and reactive to light.  Neck: Normal range of motion.  Cardiovascular: Normal rate.   Pulmonary/Chest: Effort normal. No respiratory distress.  Abdominal: Soft. She exhibits no distension. There is tenderness. There is no rebound.  Diffuse mild tenderness palpation without focality. No rebound guarding or peritonitis.  Musculoskeletal: She exhibits no edema.  Neurological: She is alert and oriented to person, place, and time. No cranial nerve deficit. She exhibits  normal muscle tone.  Skin: Skin is warm.  Psychiatric: She has a normal mood and affect.  Vitals reviewed.   ED Course  Procedures (including critical care time) Labs Review Labs Reviewed  CBC WITH DIFFERENTIAL/PLATELET - Abnormal; Notable for the following:    Monocytes Relative 23 (*)    Monocytes Absolute 2.0 (*)  All other components within normal limits  COMPREHENSIVE METABOLIC PANEL - Abnormal; Notable for the following:    Potassium 2.9 (*)    Glucose, Bld 54 (*)    BUN 5 (*)    Creatinine, Ser 2.27 (*)    Calcium 7.8 (*)    Total Protein 5.2 (*)    Albumin 2.5 (*)    ALT 13 (*)    Alkaline Phosphatase 137 (*)    GFR calc non Af Amer 21 (*)    GFR calc Af Amer 25 (*)    All other components within normal limits  LIPASE, BLOOD - Abnormal; Notable for the following:    Lipase 13 (*)    All other components within normal limits  CBG MONITORING, ED - Abnormal; Notable for the following:    Glucose-Capillary 51 (*)    All other components within normal limits  CBG MONITORING, ED - Abnormal; Notable for the following:    Glucose-Capillary 148 (*)    All other components within normal limits  CBG MONITORING, ED    Imaging Review Dg Abd Acute W/chest  12/31/2014   CLINICAL DATA:  Nausea, vomiting and diarrhea for 1 week. Diverticulitis diagnosed approximately 2 weeks ago  EXAM: DG ABDOMEN ACUTE W/ 1V CHEST  COMPARISON:  Radiographs and CT 12/21/2014.  FINDINGS: Borderline cardiomegaly and vascular congestion are again noted, similar to the prior study. There is no confluent airspace opacity or significant pleural effusion.  The bowel gas pattern appears normal. There is no free intraperitoneal air or suspicious abdominal calcification. Scattered vascular calcifications are noted. There are surgical clips in the upper abdomen bilaterally. Mild lumbar spine degenerative changes are stable.  IMPRESSION: 1. Stable radiographs of the chest and abdomen. No acute abdominal findings  identified. 2. Stable borderline cardiomegaly and vascular congestion.   Electronically Signed   By: Richardean Sale M.D.   On: 12/31/2014 09:01     EKG Interpretation None      MDM  69 year old female with abdominal pain and complaints of nausea and emesis. On arrival patient is hemodynamically stable. Is in no apparent distress. Her physical exam shows some mild tenderness palpation in the epigastric area. However this is mild. No pain throughout rest exam. There is no peritonitis rebound or guarding. Patient does have a history of IBS with similar symptoms. Had been seen last week for similar was instructed to follow up with GI. Unable to obtain appointment until next month. Signs and is most consistent with an IBS flare this patient is having complaints of some "indigestion and diarrhea". Labs significant for hypokalemia which was repleted orally. Patient also noted to have low blood sugar. This was rechecked. Likely as patient has taken her diabetes medications without eating. Not symptomatically with hyperglycemia and resolved with oral intake. KUB obtained unremarkable. Patient had CT last week which was also unremarkable and no indication for repeat now. Labs show no organic reason for patient's abdominal pain. Not consistent with any serious. No complaints of chest pain or shortness of breath associated with this. Likely patient with IBS flare. We'll provide antiemetics here and for home. Recommend continuing plan follow-up with GI. Discussed return precautions patient discharged   Results for orders placed or performed during the hospital encounter of 12/31/14  CBC with Differential  Result Value Ref Range   WBC 8.8 4.0 - 10.5 K/uL   RBC 4.64 3.87 - 5.11 MIL/uL   Hemoglobin 13.4 12.0 - 15.0 g/dL   HCT 42.1 36.0 - 46.0 %  MCV 90.7 78.0 - 100.0 fL   MCH 28.9 26.0 - 34.0 pg   MCHC 31.8 30.0 - 36.0 g/dL   RDW 14.0 11.5 - 15.5 %   Platelets 351 150 - 400 K/uL   Neutrophils Relative % 44  43 - 77 %   Neutro Abs 3.9 1.7 - 7.7 K/uL   Lymphocytes Relative 29 12 - 46 %   Lymphs Abs 2.5 0.7 - 4.0 K/uL   Monocytes Relative 23 (H) 3 - 12 %   Monocytes Absolute 2.0 (H) 0.1 - 1.0 K/uL   Eosinophils Relative 3 0 - 5 %   Eosinophils Absolute 0.3 0.0 - 0.7 K/uL   Basophils Relative 1 0 - 1 %   Basophils Absolute 0.1 0.0 - 0.1 K/uL  Comprehensive metabolic panel  Result Value Ref Range   Sodium 141 135 - 145 mmol/L   Potassium 2.9 (L) 3.5 - 5.1 mmol/L   Chloride 105 101 - 111 mmol/L   CO2 25 22 - 32 mmol/L   Glucose, Bld 54 (L) 65 - 99 mg/dL   BUN 5 (L) 6 - 20 mg/dL   Creatinine, Ser 2.27 (H) 0.44 - 1.00 mg/dL   Calcium 7.8 (L) 8.9 - 10.3 mg/dL   Total Protein 5.2 (L) 6.5 - 8.1 g/dL   Albumin 2.5 (L) 3.5 - 5.0 g/dL   AST 23 15 - 41 U/L   ALT 13 (L) 14 - 54 U/L   Alkaline Phosphatase 137 (H) 38 - 126 U/L   Total Bilirubin 0.6 0.3 - 1.2 mg/dL   GFR calc non Af Amer 21 (L) >60 mL/min   GFR calc Af Amer 25 (L) >60 mL/min   Anion gap 11 5 - 15  Lipase, blood  Result Value Ref Range   Lipase 13 (L) 22 - 51 U/L  CBG monitoring, ED  Result Value Ref Range   Glucose-Capillary 51 (L) 65 - 99 mg/dL  CBG monitoring, ED  Result Value Ref Range   Glucose-Capillary 66 65 - 99 mg/dL  CBG monitoring, ED  Result Value Ref Range   Glucose-Capillary 148 (H) 65 - 99 mg/dL   Ct Abdomen Pelvis Wo Contrast  12/21/2014   CLINICAL DATA:  Acute onset of generalized abdominal pain, nausea, vomiting and diarrhea. Initial encounter.  EXAM: CT ABDOMEN AND PELVIS WITHOUT CONTRAST  TECHNIQUE: Multidetector CT imaging of the abdomen and pelvis was performed following the standard protocol without IV contrast.  COMPARISON:  CT of the abdomen and pelvis performed 12/16/2014  FINDINGS: Mild bibasilar bronchiectasis is noted, with mild associated scarring on the right. Scattered coronary artery calcifications are seen.  A partially calcified 2.0 cm hypoattenuating focus near the hepatic dome is stable in  appearance from prior studies and likely benign. The spleen is absent. The patient is status Brylan Dec cholecystectomy, with clips noted along the gallbladder fossa. The pancreas and adrenal glands are unremarkable.  The kidneys are unremarkable in appearance. There is no evidence of hydronephrosis. No renal or ureteral stones are seen. Nonspecific perinephric stranding is noted bilaterally.  No free fluid is identified. The small bowel is unremarkable in appearance. The stomach is within normal limits. No acute vascular abnormalities are seen. Mild calcification is noted along the abdominal aorta and its branches.  The patient is status Yesennia Hirota appendectomy. Scattered small soft tissue densities along the descending colon are thought to reflect splenules. Scattered diverticulosis is noted along the distal descending and proximal sigmoid colon, without evidence of diverticulitis. The colon is otherwise  unremarkable in appearance.  The bladder is mildly distended and grossly unremarkable. The patient is status Ronin Rehfeldt hysterectomy. The ovaries are relatively symmetric. No suspicious adnexal masses are seen.  Chronic anterior soft tissue inflammation is noted along the abdominal wall. No inguinal lymphadenopathy is seen.  No acute osseous abnormalities are identified. Vacuum phenomenon is noted at L5-S1. Underlying facet disease is noted. There is minimal disc space narrowing at L3-L4.  IMPRESSION: 1. No acute abnormality seen within the abdomen or pelvis. 2. Scattered mild diverticulosis along the distal descending and proximal sigmoid colon, without evidence of diverticulitis. 3. Mild bibasilar bronchiectasis, with mild right basilar scarring. 4. Scattered coronary artery calcifications seen. 5. Mild calcification along the abdominal aorta and its branches. 6. Likely benign partially calcified 2.0 cm hypoattenuating focus near the hepatic dome, stable in appearance. 7. Chronic anterior soft tissue inflammation noted along the  abdominal wall.   Electronically Signed   By: Garald Balding M.D.   On: 12/21/2014 17:32   Ct Abdomen Pelvis Wo Contrast  12/16/2014   CLINICAL DATA:  Vomiting x2 since 0 4/scratch the vomiting x2 since 12/15/2014. Diffuse abdominal pain.  EXAM: CT ABDOMEN AND PELVIS WITHOUT CONTRAST  TECHNIQUE: Multidetector CT imaging of the abdomen and pelvis was performed following the standard protocol without IV contrast.  COMPARISON:  CT abdomen and pelvis 05/12/2014, 08/25/2013 and 07/21/2012.  FINDINGS: Cardiomegaly. No pleural or pericardial effusion is identified. Mild atelectatic change is seen in the lung bases.  The patient is status Kymia Simi cholecystectomy. Partially calcified low attenuating lesion the posterior right hepatic lobe measures 1.3 cm, unchanged. Subtle nodularity of the liver border along the left hepatic lobe is noted. No focal liver lesion is identified.  The adrenal glands, pancreas and kidneys are unremarkable. 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  The patient is status Devera Englander hysterectomy. A few scattered diverticula are noted there is no evidence of diverticulitis. Soft tissue nodules in the left upper quadrant the abdomen along the descending colon are consistent with splenosis and unchanged. Cutaneous tissues the anterior abdominal wall appears indurated, unchanged. No lymphadenopathy or fluid is identified. No focal bony abnormality is seen. Lower lumbar degenerative change is noted.  IMPRESSION: No acute finding abdomen or pelvis.  Status Michel Hendon cholecystectomy, splenectomy and hysterectomy.  Cardiomegaly.  Subtle nodularity of the liver border suggestive of cirrhosis.   Electronically Signed   By: Inge Rise M.D.   On: 12/16/2014 14:49   Dg Abd Acute W/chest  12/31/2014   CLINICAL DATA:  Nausea, vomiting and diarrhea for 1 week. Diverticulitis diagnosed approximately 2 weeks ago  EXAM: DG ABDOMEN ACUTE W/ 1V CHEST  COMPARISON:  Radiographs and CT 12/21/2014.  FINDINGS: Borderline cardiomegaly and  vascular congestion are again noted, similar to the prior study. There is no confluent airspace opacity or significant pleural effusion.  The bowel gas pattern appears normal. There is no free intraperitoneal air or suspicious abdominal calcification. Scattered vascular calcifications are noted. There are surgical clips in the upper abdomen bilaterally. Mild lumbar spine degenerative changes are stable.  IMPRESSION: 1. Stable radiographs of the chest and abdomen. No acute abdominal findings identified. 2. Stable borderline cardiomegaly and vascular congestion.   Electronically Signed   By: Richardean Sale M.D.   On: 12/31/2014 09:01   Dg Abd Acute W/chest  12/21/2014   CLINICAL DATA:  69 year old female with pain across mid and lower abdomen to the past 3 days accompanied by nausea and diarrhea. Clinical history includes IBS.  EXAM: DG ABDOMEN ACUTE  W/ 1V CHEST  COMPARISON:  Prior CT abdomen/ pelvis 12/16/2014  FINDINGS: Borderline cardiomegaly. Atherosclerotic calcification present in the transverse aorta. Pulmonary vascular congestion without overt edema. No focal airspace consolidation.  No evidence of free air or obstruction. Surgical clips in the right upper quadrant and left upper quadrant. No organomegaly or abnormal calcification. Lower lumbar degenerative disc disease and left greater than right facet arthropathy. Bones appear slightly osteopenic. No lytic or blastic osseous lesion.  IMPRESSION: 1. Unremarkable, nonobstructed bowel gas pattern. 2. Cardiomegaly. 3. Aortic atherosclerosis. 4. Lower lumbar degenerative disc disease and facet arthropathy.   Electronically Signed   By: Jacqulynn Cadet M.D.   On: 12/21/2014 10:55    Final diagnoses:  Hypokalemia  Abdominal pain, unspecified abdominal location  Non-intractable vomiting with nausea, vomiting of unspecified type        Robynn Pane, MD 12/31/14 1506  Debby Freiberg, MD 01/04/15 1600

## 2014-12-31 NOTE — ED Notes (Signed)
MD aware of CBG pt given Ginger Ale as PO challenge.

## 2014-12-31 NOTE — ED Notes (Addendum)
[facility]- pt coming from home with c/o n/v/d for more than one week. Reports she has been unable to keep meds and foods down. Diagnosed with diverticulitis on 12/14/14. Pt given 150 of fluids and 4mg  of zofran en route.

## 2015-01-01 ENCOUNTER — Ambulatory Visit (INDEPENDENT_AMBULATORY_CARE_PROVIDER_SITE_OTHER): Payer: Medicare Other | Admitting: Internal Medicine

## 2015-01-01 ENCOUNTER — Encounter: Payer: Self-pay | Admitting: Internal Medicine

## 2015-01-01 ENCOUNTER — Telehealth: Payer: Self-pay

## 2015-01-01 VITALS — BP 130/86 | HR 80 | Ht 62.0 in | Wt 208.0 lb

## 2015-01-01 DIAGNOSIS — K219 Gastro-esophageal reflux disease without esophagitis: Secondary | ICD-10-CM

## 2015-01-01 DIAGNOSIS — R1084 Generalized abdominal pain: Secondary | ICD-10-CM | POA: Diagnosis not present

## 2015-01-01 DIAGNOSIS — R112 Nausea with vomiting, unspecified: Secondary | ICD-10-CM | POA: Diagnosis not present

## 2015-01-01 DIAGNOSIS — R197 Diarrhea, unspecified: Secondary | ICD-10-CM

## 2015-01-01 DIAGNOSIS — T82848A Pain from vascular prosthetic devices, implants and grafts, initial encounter: Secondary | ICD-10-CM

## 2015-01-01 MED ORDER — ESOMEPRAZOLE MAGNESIUM 40 MG PO CPDR: 40.0000 mg | DELAYED_RELEASE_CAPSULE | Freq: Two times a day (BID) | ORAL | Status: DC

## 2015-01-01 MED ORDER — PROMETHAZINE HCL 25 MG PO TABS: 25.0000 mg | ORAL_TABLET | ORAL | Status: DC

## 2015-01-01 NOTE — Telephone Encounter (Signed)
Added- sent to HIM

## 2015-01-01 NOTE — Patient Instructions (Signed)
Your physician has requested that you go to the basement for the following lab work before leaving today:  Pathogen panel  We have sent the following medications to your pharmacy for you to pick up at your convenience:  Zofran - increase to taking every 4 hours around the clock; Nexium - increase to twice a day.  You have been scheduled for a gastric emptying scan at [facility] on 01/02/2015 at 9:30am. Please arrive at least 15 minutes prior to your appointment for registration. Please make certain not to have anything to eat or drink after midnight the night before your test. Hold all stomach medications (ex: Zofran, phenergan, Reglan) 8 hours prior to your test. If you need to reschedule your appointment, please contact radiology scheduling at [phone]. _____________________________________________________________________ A gastric-emptying study measures how long it takes for food to move through your stomach. There are several ways to measure stomach emptying. In the most common test, you eat food that contains a small amount of radioactive material. A scanner that detects the movement of the radioactive material is placed over your abdomen to monitor the rate at which food leaves your stomach. This test normally takes about 2 hours to complete. _____________________________________________________________________

## 2015-01-01 NOTE — Progress Notes (Signed)
HISTORY OF PRESENT ILLNESS:  [name] is a pleasant but extremely medically complicated 69 y.o. female who presents today with her sister at the urging of the emergency room for evaluation of recent problems with abdominal pain, nausea with vomiting, and diarrhea. Multiple medical problems as listed below. She has end-stage renal disease and is anticipating dialysis at some point. Also wears oxygen for COPD. Insulin requiring diabetes.. I have reviewed multiple emergency room visits of recent, laboratories, and x-ray studies. I initially saw the patient in May 2011 with multiple GI complaints. See that dictation. Most recently same October 2014 with abdominal discomfort felt likely secondary to constipation, GERD, and a history of colon polyps. Given Linzess. This worked but often related and loose bowels. Last colonoscopy 01/05/2010 with diminutive adenomatous. Follow-up in 5 years if medically fit recommended. Upper endoscopy that same time was normal. Patient states that about 3 weeks ago she developed acute onset of nausea with vomiting, abdominal pain, and diarrhea. This after taking medication for gout. She has not used allopurinol since. Gout has resolved. Nausea is chronic with intermittent vomiting. Generally not undigested food. She has had diabetes for greater than 20 years. She is on multiple medications. Abdominal discomfort is described as generalized aching without relieving factors. Currently describes 6-7 loose bowel movements per day. Does complain of significant underlying nausea. Has been difficult for her eat. Complains of weakness. Last emergency room evaluation was yesterday CBC was normal. Creatinine 2.27. Potassium 2.9. Liver tests unrevealing. Recent CT scans of the abdomen and pelvis without acute abnormalities. Plain films the same. She also complains of one week or less history of pain at site of her left upper extremity AV fistula. Not on dialysis yet. Has been felt to have  IBS in the past. Prescribed ciprofloxacin and metronidazole by her PCP. Took a few doses but discontinued due to gastric distress.  REVIEW OF SYSTEMS:  All non-GI ROS negative except for fatigue, urinary leakage  Past Medical History  Diagnosis Date  . Essential hypertension, benign   . Coronary atherosclerosis of native coronary artery 01/04/2006    Tiny OM1 70-90% ostial stenosis, no other CAD  . COPD (chronic obstructive pulmonary disease)   . Esophageal reflux   . Dyslipidemia   . Gout   . Colon polyps     Tubular adenomatous polyps  . Spinal stenosis of lumbar region   . Bulging lumbar disc   . Chronic diastolic heart failure   . PNA (pneumonia) 2012    With pleural effusion, requiring thoracentesis  . Chronic bronchitis   . On home oxygen therapy   . OSA (obstructive sleep apnea)     Marginally compliant with CPAP  . Type II diabetes mellitus   . Arthritis   . Chronic lower back pain   . Diverticulosis   . GERD (gastroesophageal reflux disease)   . Anxiety   . Hyperlipidemia   . Sarcoidosis of lung   . CKD (chronic kidney disease) stage 4, GFR 15-29 ml/min   . IBS (irritable bowel syndrome)   . Cataracts, both eyes   . HYQMVHQI(696.2)     Past Surgical History  Procedure Laterality Date  . Vesicovaginal fistula closure w/  total abdominal hysterectomy  1992  . Bilateral total knee replacements Bilateral Rt=5/04 & Lft=1/09    by DrAlusio  . 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 Zella Richer  . Appendectomy  1957  . Tonsillectomy  1968  . Cholecystectomy  1980's  . Abdominal  hysterectomy  1992  . Reduction mammaplasty Bilateral 1980's  . Cardiac catheterization  1990's  . Thoracentesis  2012  . Joint replacement    . Av fistula placement Left 12/31/2013    Procedure: ARTERIOVENOUS (AV) FISTULA CREATION;  Surgeon: Elam Dutch, MD;  Location: [facility];  Service: Vascular;  Laterality: Left;  . Colonoscopy w/ biopsies and polypectomy    . Av fistula placement Left  03/18/2014    Procedure: ARTERIOVENOUS (AV) FISTULA CREATION- LEFT BRACHIOCEPHALIC ;  Surgeon: Elam Dutch, MD;  Location: [facility];  Service: Vascular;  Laterality: Left;  . Ligation of arteriovenous  fistula Left 03/18/2014    Procedure: LIGATION OF ARTERIOVENOUS  FISTULA- LEFT RADIOCEPHALIC;  Surgeon: Elam Dutch, MD;  Location: [place];  Service: Vascular;  Laterality: Left;    Social History [place]  reports that she has never smoked. She has never used smokeless tobacco. She reports that she does not drink alcohol or use illicit drugs.  family history includes Breast cancer in her maternal aunt; Cancer in her sister; Deep vein thrombosis in her father; Diabetes in her brother, mother, and sister; Heart attack in her brother and sister; Heart disease in her brother, father, mother, and sister; Hyperlipidemia in her brother, father, mother, sister, and sister; Varicose Veins in her mother.  Allergies  Allergen Reactions  . Adhesive [Tape] Other (See Comments)    Blisters   . Avelox [Moxifloxacin Hcl In Nacl] Other (See Comments)    GI upset  . Codeine Other (See Comments)    "crazy"   . Guaifenesin Nausea And Vomiting    Takes Mucinex at home without issue  . Latex Swelling  . Oxycodone Nausea And Vomiting    Takes Percocet at home without issue       PHYSICAL EXAMINATION: Vital signs: BP 130/86 mmHg  Pulse 80  Ht 5\' 2"  (1.575 m)  Wt 208 lb (94.348 kg)  BMI 38.03 kg/m2 General: Chronically ill appearing, obese, well-nourished, no acute distress but appears fatigued and in wheelchair HEENT: Sclerae are anicteric, conjunctiva pink. Oral mucosa intact Lungs: Clear to auscultation and percussion Heart: Regular Abdomen: soft, obese, diffuse abdominal wall tenderness with minimal palpation, nondistended, no obvious ascites, no peritoneal signs, normal bowel sounds. No organomegaly. Extremities: 1+ edema lower extremities bilaterally. AV fistula in the left upper  extremity warm and tender to touch Psychiatric: alert and oriented x3. Cooperative   ASSESSMENT:  #1. Chronic abdominal pain. Has had previously. Negative imaging studies. Muscle wall tenderness on physical exam #2. Chronic nausea with intermittent vomiting. Multiple possibilities including diabetic gastroparesis, polypharmacy, refractory GERD, comorbidities including renal disease. #3. Diarrhea. Worse with Linzess. Ongoing off Linzess. Question viral illness. Question diabetic diarrhea. Question occult infection. Question medication reaction #4. Pain and warmth at left upper extremity AV fistula site. Needs evaluated by vascular surgery ASAP. #5. History of adenomatous colon polyps, small, 2011. Not appropriate for follow-up given her comorbidities #6. Negative EGD 2011   PLAN:  #1. Instructed to use Zofran every 4-6 hours around the clock #2. Increase Nexium to 40 mg twice a day #3. GI stool pathogen panel #4. Vigorous hydration #5. Solid-phase gastric emptying scan ordered. Rule out gastroparesis #6. Evaluation of AV fistula with Dr. Oneida Alar ASAP. We contacted their office with the clinical information requesting ASAP evaluation. They have provided appointment for tomorrow afternoon #7. The patient has been asked to contact this office in 1 week with a clinical update. She agrees. May need antidiarrhea illness  if diarrhea persists and stool studies negative   A copy has been sent to the patient's PCP Dr. Chapman Fitch

## 2015-01-01 NOTE — Telephone Encounter (Signed)
Phone call from Dr. Blanch Media office @ [place].  Requested appt. for evaluation ASAP, due to increased pain and warmth of left upper arm AVF.   Appt. given @ 2:30 PM 01/02/15.  Magda Paganini, nurse with Dr. Henrene Pastor will give appt. information to pt.

## 2015-01-02 ENCOUNTER — Encounter: Payer: Self-pay | Admitting: Internal Medicine

## 2015-01-02 ENCOUNTER — Encounter: Payer: Self-pay | Admitting: Vascular Surgery

## 2015-01-02 ENCOUNTER — Encounter (HOSPITAL_COMMUNITY): Payer: Self-pay

## 2015-01-02 ENCOUNTER — Ambulatory Visit (HOSPITAL_COMMUNITY)
Admission: RE | Admit: 2015-01-02 | Discharge: 2015-01-02 | Disposition: A | Discharge status: Home / Self Care | Payer: Medicare Other | Source: Ambulatory Visit | Attending: Internal Medicine | Admitting: Internal Medicine

## 2015-01-02 ENCOUNTER — Ambulatory Visit (INDEPENDENT_AMBULATORY_CARE_PROVIDER_SITE_OTHER): Payer: Medicare Other | Admitting: Vascular Surgery

## 2015-01-02 ENCOUNTER — Other Ambulatory Visit: Payer: Self-pay

## 2015-01-02 ENCOUNTER — Other Ambulatory Visit: Payer: Medicare Other

## 2015-01-02 VITALS — BP 180/78 | HR 82 | Temp 97.5°F | Resp 16 | Ht 62.5 in | Wt 199.0 lb

## 2015-01-02 DIAGNOSIS — T82510D Breakdown (mechanical) of surgically created arteriovenous fistula, subsequent encounter: Secondary | ICD-10-CM | POA: Diagnosis not present

## 2015-01-02 DIAGNOSIS — R197 Diarrhea, unspecified: Secondary | ICD-10-CM

## 2015-01-02 DIAGNOSIS — N184 Chronic kidney disease, stage 4 (severe): Secondary | ICD-10-CM | POA: Diagnosis not present

## 2015-01-02 DIAGNOSIS — R109 Unspecified abdominal pain: Secondary | ICD-10-CM | POA: Diagnosis present

## 2015-01-02 DIAGNOSIS — T82590D Other mechanical complication of surgically created arteriovenous fistula, subsequent encounter: Secondary | ICD-10-CM

## 2015-01-02 DIAGNOSIS — R1084 Generalized abdominal pain: Secondary | ICD-10-CM

## 2015-01-02 DIAGNOSIS — R112 Nausea with vomiting, unspecified: Secondary | ICD-10-CM | POA: Diagnosis not present

## 2015-01-02 DIAGNOSIS — R140 Abdominal distension (gaseous): Secondary | ICD-10-CM | POA: Insufficient documentation

## 2015-01-02 MED ORDER — TECHNETIUM TC 99M SULFUR COLLOID
2.0000 | Freq: Once | INTRAVENOUS | Status: AC | PRN
  Administered 2015-01-02: 2 via ORAL

## 2015-01-02 NOTE — Progress Notes (Signed)
Filed Vitals:   01/02/15 1445 01/02/15 1448  BP: 169/81 180/78  Pulse: 78 82  Temp: 97.5 F (36.4 C)   Resp: 16   Height: 5' 2.5" (1.588 m)   Weight: 199 lb (90.266 kg)    Body mass index is 35.8 kg/(m^2).

## 2015-01-02 NOTE — Addendum Note (Signed)
Addended by: Dorthula Rue L on: 01/02/2015 03:57 PM   Modules accepted: Orders

## 2015-01-02 NOTE — Progress Notes (Addendum)
Patient is a 69 year old female who is seen today for evaluation of pain over her left upper arm AV fistula. She had a left brachiocephalic AV fistula placed August 2015. She states the fistula had a thrill in it up until 2 weeks ago. She began to have pain at that point. She also has had chronic problems with nausea vomiting and abdominal pain which is currently being evaluated. She denies any episodes of hypotension. She is currently not on dialysis.  Review of systems: She denies fever or chills. She has had nausea and vomiting and abdominal pain and has had a gastric emptying study and several other studies performed at GI recently  Physical exam:  Filed Vitals:   01/02/15 1445 01/02/15 1448  BP: 169/81 180/78  Pulse: 78 82  Temp: 97.5 F (36.4 C)   Resp: 16   Height: 5' 2.5" (1.588 m)   Weight: 90.266 kg (199 lb)     Left upper extremity: 2+ brachial radial pulse palpable left upper arm AV fistula thrombosed on palpation no audible bruit or palpable thrill, there is some mild erythema over the proximal fistula. This is slightly tender to palpation.  Assessment: Thrombosed left upper arm AV fistula. The patient has now failed a left radiocephalic fistula which never matured. She has now thrombosed and arm fistula. She is currently not on hemodialysis. She is also currently undergoing a GI workup.  Plan: The patient will follow-up with me in 1 month. I will send a copy of my note to Dr. Joelyn Oms today. I will await his input on whether or not to go ahead and place another access in the right arm or to wait until hemodialysis may be more imminent. She will have a vein mapping ultrasound that her next office visit. Current symptoms of erythema and pain are due to thrombophlebitis recently thrombosed fistula. These should resolve with conservative measures of elevation and warm compresses ice and Tylenol for pain.  Ruta Hinds, MD [facility] Office:  [phone] Pager: [phone]

## 2015-01-09 ENCOUNTER — Encounter (HOSPITAL_BASED_OUTPATIENT_CLINIC_OR_DEPARTMENT_OTHER): Payer: Medicare Other

## 2015-01-12 LAB — GI PATHOGEN PANEL BY PCR, STOOL

## 2015-01-15 ENCOUNTER — Telehealth: Payer: Self-pay | Admitting: Internal Medicine

## 2015-01-15 NOTE — Telephone Encounter (Signed)
Patient is calling with update for Dr. Henrene Pastor. States she is still having stomach pain and is vomiting sometimes when she eats. She last vomited this AM. She is able to drink ginger ale, water and other fluids. She is taking her Nexium BID. She is wondering if she has an ulcer. Patient understands to go to ED if she thinks she is dehydrated. She is also aware MD is out of the office until next week.

## 2015-01-16 ENCOUNTER — Ambulatory Visit: Payer: Medicare Other | Admitting: Pulmonary Disease

## 2015-01-20 ENCOUNTER — Encounter (HOSPITAL_COMMUNITY): Payer: Self-pay | Admitting: Emergency Medicine

## 2015-01-20 ENCOUNTER — Emergency Department (INDEPENDENT_AMBULATORY_CARE_PROVIDER_SITE_OTHER)
Admission: EM | Admit: 2015-01-20 | Discharge: 2015-01-20 | Disposition: A | Discharge status: Nursing Home (Includes ICF) | Payer: Medicare Other | Source: Home / Self Care | Attending: Family Medicine | Admitting: Family Medicine

## 2015-01-20 ENCOUNTER — Encounter (HOSPITAL_COMMUNITY): Payer: Self-pay | Admitting: *Deleted

## 2015-01-20 ENCOUNTER — Emergency Department (HOSPITAL_COMMUNITY): Payer: Medicare Other

## 2015-01-20 ENCOUNTER — Inpatient Hospital Stay (HOSPITAL_COMMUNITY)
Admission: EM | Admit: 2015-01-20 | Discharge: 2015-02-02 | DRG: 871 | Disposition: A | Discharge status: Home Health | Payer: Medicare Other | Attending: Internal Medicine | Admitting: Internal Medicine

## 2015-01-20 DIAGNOSIS — G43A1 Cyclical vomiting, intractable: Secondary | ICD-10-CM | POA: Diagnosis not present

## 2015-01-20 DIAGNOSIS — N179 Acute kidney failure, unspecified: Secondary | ICD-10-CM

## 2015-01-20 DIAGNOSIS — I513 Intracardiac thrombosis, not elsewhere classified: Secondary | ICD-10-CM | POA: Diagnosis present

## 2015-01-20 DIAGNOSIS — N189 Chronic kidney disease, unspecified: Secondary | ICD-10-CM

## 2015-01-20 DIAGNOSIS — M109 Gout, unspecified: Secondary | ICD-10-CM | POA: Diagnosis present

## 2015-01-20 DIAGNOSIS — Z91040 Latex allergy status: Secondary | ICD-10-CM

## 2015-01-20 DIAGNOSIS — B379 Candidiasis, unspecified: Secondary | ICD-10-CM | POA: Diagnosis not present

## 2015-01-20 DIAGNOSIS — D860 Sarcoidosis of lung: Secondary | ICD-10-CM | POA: Diagnosis present

## 2015-01-20 DIAGNOSIS — D509 Iron deficiency anemia, unspecified: Secondary | ICD-10-CM

## 2015-01-20 DIAGNOSIS — L409 Psoriasis, unspecified: Secondary | ICD-10-CM

## 2015-01-20 DIAGNOSIS — E662 Morbid (severe) obesity with alveolar hypoventilation: Secondary | ICD-10-CM | POA: Diagnosis present

## 2015-01-20 DIAGNOSIS — I4729 Other ventricular tachycardia: Secondary | ICD-10-CM

## 2015-01-20 DIAGNOSIS — W19XXXA Unspecified fall, initial encounter: Secondary | ICD-10-CM

## 2015-01-20 DIAGNOSIS — I5043 Acute on chronic combined systolic (congestive) and diastolic (congestive) heart failure: Secondary | ICD-10-CM | POA: Diagnosis present

## 2015-01-20 DIAGNOSIS — J9621 Acute and chronic respiratory failure with hypoxia: Secondary | ICD-10-CM

## 2015-01-20 DIAGNOSIS — I2109 ST elevation (STEMI) myocardial infarction involving other coronary artery of anterior wall: Secondary | ICD-10-CM | POA: Diagnosis not present

## 2015-01-20 DIAGNOSIS — E0821 Diabetes mellitus due to underlying condition with diabetic nephropathy: Secondary | ICD-10-CM

## 2015-01-20 DIAGNOSIS — E7800 Pure hypercholesterolemia, unspecified: Secondary | ICD-10-CM

## 2015-01-20 DIAGNOSIS — I4719 Other supraventricular tachycardia: Secondary | ICD-10-CM

## 2015-01-20 DIAGNOSIS — I509 Heart failure, unspecified: Secondary | ICD-10-CM

## 2015-01-20 DIAGNOSIS — D869 Sarcoidosis, unspecified: Secondary | ICD-10-CM

## 2015-01-20 DIAGNOSIS — J9610 Chronic respiratory failure, unspecified whether with hypoxia or hypercapnia: Secondary | ICD-10-CM | POA: Diagnosis present

## 2015-01-20 DIAGNOSIS — J9611 Chronic respiratory failure with hypoxia: Secondary | ICD-10-CM

## 2015-01-20 DIAGNOSIS — R1084 Generalized abdominal pain: Secondary | ICD-10-CM

## 2015-01-20 DIAGNOSIS — Z7982 Long term (current) use of aspirin: Secondary | ICD-10-CM

## 2015-01-20 DIAGNOSIS — R197 Diarrhea, unspecified: Secondary | ICD-10-CM

## 2015-01-20 DIAGNOSIS — G4733 Obstructive sleep apnea (adult) (pediatric): Secondary | ICD-10-CM | POA: Diagnosis present

## 2015-01-20 DIAGNOSIS — Z6836 Body mass index (BMI) 36.0-36.9, adult: Secondary | ICD-10-CM | POA: Diagnosis not present

## 2015-01-20 DIAGNOSIS — R011 Cardiac murmur, unspecified: Secondary | ICD-10-CM

## 2015-01-20 DIAGNOSIS — R531 Weakness: Secondary | ICD-10-CM

## 2015-01-20 DIAGNOSIS — I272 Other secondary pulmonary hypertension: Secondary | ICD-10-CM | POA: Diagnosis present

## 2015-01-20 DIAGNOSIS — Z9114 Patient's other noncompliance with medication regimen: Secondary | ICD-10-CM | POA: Diagnosis present

## 2015-01-20 DIAGNOSIS — Z96653 Presence of artificial knee joint, bilateral: Secondary | ICD-10-CM | POA: Diagnosis present

## 2015-01-20 DIAGNOSIS — J441 Chronic obstructive pulmonary disease with (acute) exacerbation: Secondary | ICD-10-CM

## 2015-01-20 DIAGNOSIS — Z888 Allergy status to other drugs, medicaments and biological substances status: Secondary | ICD-10-CM

## 2015-01-20 DIAGNOSIS — E872 Acidosis, unspecified: Secondary | ICD-10-CM

## 2015-01-20 DIAGNOSIS — I472 Ventricular tachycardia: Secondary | ICD-10-CM | POA: Diagnosis not present

## 2015-01-20 DIAGNOSIS — Z885 Allergy status to narcotic agent status: Secondary | ICD-10-CM | POA: Diagnosis not present

## 2015-01-20 DIAGNOSIS — Z794 Long term (current) use of insulin: Secondary | ICD-10-CM | POA: Diagnosis not present

## 2015-01-20 DIAGNOSIS — D126 Benign neoplasm of colon, unspecified: Secondary | ICD-10-CM

## 2015-01-20 DIAGNOSIS — G8929 Other chronic pain: Secondary | ICD-10-CM

## 2015-01-20 DIAGNOSIS — R079 Chest pain, unspecified: Secondary | ICD-10-CM | POA: Diagnosis not present

## 2015-01-20 DIAGNOSIS — R112 Nausea with vomiting, unspecified: Secondary | ICD-10-CM

## 2015-01-20 DIAGNOSIS — N186 End stage renal disease: Secondary | ICD-10-CM

## 2015-01-20 DIAGNOSIS — K219 Gastro-esophageal reflux disease without esophagitis: Secondary | ICD-10-CM

## 2015-01-20 DIAGNOSIS — Z9081 Acquired absence of spleen: Secondary | ICD-10-CM | POA: Diagnosis present

## 2015-01-20 DIAGNOSIS — Z8249 Family history of ischemic heart disease and other diseases of the circulatory system: Secondary | ICD-10-CM | POA: Diagnosis not present

## 2015-01-20 DIAGNOSIS — Z79891 Long term (current) use of opiate analgesic: Secondary | ICD-10-CM | POA: Diagnosis not present

## 2015-01-20 DIAGNOSIS — E1122 Type 2 diabetes mellitus with diabetic chronic kidney disease: Secondary | ICD-10-CM | POA: Diagnosis present

## 2015-01-20 DIAGNOSIS — E785 Hyperlipidemia, unspecified: Secondary | ICD-10-CM | POA: Diagnosis present

## 2015-01-20 DIAGNOSIS — I120 Hypertensive chronic kidney disease with stage 5 chronic kidney disease or end stage renal disease: Secondary | ICD-10-CM | POA: Diagnosis present

## 2015-01-20 DIAGNOSIS — A419 Sepsis, unspecified organism: Principal | ICD-10-CM

## 2015-01-20 DIAGNOSIS — Z8639 Personal history of other endocrine, nutritional and metabolic disease: Secondary | ICD-10-CM

## 2015-01-20 DIAGNOSIS — E669 Obesity, unspecified: Secondary | ICD-10-CM

## 2015-01-20 DIAGNOSIS — E1129 Type 2 diabetes mellitus with other diabetic kidney complication: Secondary | ICD-10-CM | POA: Diagnosis present

## 2015-01-20 DIAGNOSIS — I214 Non-ST elevation (NSTEMI) myocardial infarction: Secondary | ICD-10-CM | POA: Diagnosis not present

## 2015-01-20 DIAGNOSIS — Z79899 Other long term (current) drug therapy: Secondary | ICD-10-CM | POA: Diagnosis not present

## 2015-01-20 DIAGNOSIS — E860 Dehydration: Secondary | ICD-10-CM | POA: Diagnosis present

## 2015-01-20 DIAGNOSIS — F419 Anxiety disorder, unspecified: Secondary | ICD-10-CM | POA: Diagnosis present

## 2015-01-20 DIAGNOSIS — G43A0 Cyclical vomiting, not intractable: Secondary | ICD-10-CM | POA: Diagnosis not present

## 2015-01-20 DIAGNOSIS — E43 Unspecified severe protein-calorie malnutrition: Secondary | ICD-10-CM

## 2015-01-20 DIAGNOSIS — I5041 Acute combined systolic (congestive) and diastolic (congestive) heart failure: Secondary | ICD-10-CM | POA: Diagnosis not present

## 2015-01-20 DIAGNOSIS — I5023 Acute on chronic systolic (congestive) heart failure: Secondary | ICD-10-CM | POA: Diagnosis not present

## 2015-01-20 DIAGNOSIS — R55 Syncope and collapse: Secondary | ICD-10-CM | POA: Diagnosis not present

## 2015-01-20 DIAGNOSIS — I10 Essential (primary) hypertension: Secondary | ICD-10-CM | POA: Diagnosis not present

## 2015-01-20 DIAGNOSIS — J449 Chronic obstructive pulmonary disease, unspecified: Secondary | ICD-10-CM | POA: Diagnosis present

## 2015-01-20 DIAGNOSIS — R001 Bradycardia, unspecified: Secondary | ICD-10-CM

## 2015-01-20 DIAGNOSIS — Q2733 Arteriovenous malformation of digestive system vessel: Secondary | ICD-10-CM

## 2015-01-20 DIAGNOSIS — I5042 Chronic combined systolic (congestive) and diastolic (congestive) heart failure: Secondary | ICD-10-CM | POA: Diagnosis not present

## 2015-01-20 DIAGNOSIS — Z9981 Dependence on supplemental oxygen: Secondary | ICD-10-CM

## 2015-01-20 DIAGNOSIS — Z833 Family history of diabetes mellitus: Secondary | ICD-10-CM | POA: Diagnosis not present

## 2015-01-20 DIAGNOSIS — E876 Hypokalemia: Secondary | ICD-10-CM

## 2015-01-20 DIAGNOSIS — J984 Other disorders of lung: Secondary | ICD-10-CM

## 2015-01-20 DIAGNOSIS — R1110 Vomiting, unspecified: Secondary | ICD-10-CM | POA: Diagnosis not present

## 2015-01-20 DIAGNOSIS — I5032 Chronic diastolic (congestive) heart failure: Secondary | ICD-10-CM | POA: Diagnosis not present

## 2015-01-20 DIAGNOSIS — I420 Dilated cardiomyopathy: Secondary | ICD-10-CM | POA: Diagnosis not present

## 2015-01-20 DIAGNOSIS — I2510 Atherosclerotic heart disease of native coronary artery without angina pectoris: Secondary | ICD-10-CM | POA: Diagnosis present

## 2015-01-20 DIAGNOSIS — N184 Chronic kidney disease, stage 4 (severe): Secondary | ICD-10-CM | POA: Diagnosis not present

## 2015-01-20 DIAGNOSIS — E875 Hyperkalemia: Secondary | ICD-10-CM

## 2015-01-20 DIAGNOSIS — D472 Monoclonal gammopathy: Secondary | ICD-10-CM

## 2015-01-20 DIAGNOSIS — I270 Primary pulmonary hypertension: Secondary | ICD-10-CM | POA: Diagnosis not present

## 2015-01-20 DIAGNOSIS — I471 Supraventricular tachycardia: Secondary | ICD-10-CM

## 2015-01-20 DIAGNOSIS — M549 Dorsalgia, unspecified: Secondary | ICD-10-CM

## 2015-01-20 HISTORY — DX: Acidosis: E87.2

## 2015-01-20 HISTORY — DX: Heart failure, unspecified: I50.9

## 2015-01-20 LAB — COMPREHENSIVE METABOLIC PANEL
ALT: 11 U/L — ABNORMAL LOW (ref 14–54)
AST: 38 U/L (ref 15–41)
Albumin: 2.1 g/dL — ABNORMAL LOW (ref 3.5–5.0)
Alkaline Phosphatase: 304 U/L — ABNORMAL HIGH (ref 38–126)
Anion gap: 10 (ref 5–15)
BILIRUBIN TOTAL: 0.6 mg/dL (ref 0.3–1.2)
BUN: 16 mg/dL (ref 6–20)
CALCIUM: 8.3 mg/dL — AB (ref 8.9–10.3)
CO2: 22 mmol/L (ref 22–32)
Chloride: 108 mmol/L (ref 101–111)
Creatinine, Ser: 2.45 mg/dL — ABNORMAL HIGH (ref 0.44–1.00)
GFR, EST AFRICAN AMERICAN: 23 mL/min — AB (ref >60)
GFR, EST NON AFRICAN AMERICAN: 19 mL/min — AB (ref >60)
GLUCOSE: 128 mg/dL — AB (ref 65–99)
POTASSIUM: 3.1 mmol/L — AB (ref 3.5–5.1)
SODIUM: 140 mmol/L (ref 135–145)
TOTAL PROTEIN: 5.6 g/dL — AB (ref 6.5–8.1)

## 2015-01-20 LAB — URINE MICROSCOPIC-ADD ON

## 2015-01-20 LAB — CBC WITH DIFFERENTIAL/PLATELET
BASOS PCT: 0 % (ref 0–1)
Basophils Absolute: 0 10*3/uL (ref 0.0–0.1)
EOS ABS: 0.2 10*3/uL (ref 0.0–0.7)
EOS PCT: 3 % (ref 0–5)
HCT: 48.1 % — ABNORMAL HIGH (ref 36.0–46.0)
HEMOGLOBIN: 15.8 g/dL — AB (ref 12.0–15.0)
LYMPHS ABS: 2.2 10*3/uL (ref 0.7–4.0)
Lymphocytes Relative: 30 % (ref 12–46)
MCH: 29.2 pg (ref 26.0–34.0)
MCHC: 32.8 g/dL (ref 30.0–36.0)
MCV: 88.7 fL (ref 78.0–100.0)
MONOS PCT: 17 % — AB (ref 3–12)
Monocytes Absolute: 1.3 10*3/uL — ABNORMAL HIGH (ref 0.1–1.0)
NEUTROS PCT: 50 % (ref 43–77)
Neutro Abs: 3.7 10*3/uL (ref 1.7–7.7)
Platelets: 375 10*3/uL (ref 150–400)
RBC: 5.42 MIL/uL — AB (ref 3.87–5.11)
RDW: 15.7 % — ABNORMAL HIGH (ref 11.5–15.5)
WBC: 7.4 10*3/uL (ref 4.0–10.5)

## 2015-01-20 LAB — GLUCOSE, CAPILLARY: GLUCOSE-CAPILLARY: 83 mg/dL (ref 65–99)

## 2015-01-20 LAB — URINALYSIS, ROUTINE W REFLEX MICROSCOPIC
Bilirubin Urine: NEGATIVE
GLUCOSE, UA: 100 mg/dL — AB
Ketones, ur: NEGATIVE mg/dL
Leukocytes, UA: NEGATIVE
Nitrite: NEGATIVE
Specific Gravity, Urine: 1.014 (ref 1.005–1.030)
Urobilinogen, UA: 0.2 mg/dL (ref 0.0–1.0)
pH: 6 (ref 5.0–8.0)

## 2015-01-20 LAB — I-STAT CG4 LACTIC ACID, ED: Lactic Acid, Venous: 3.07 mmol/L (ref 0.5–2.0)

## 2015-01-20 LAB — LIPASE, BLOOD: LIPASE: 14 U/L — AB (ref 22–51)

## 2015-01-20 LAB — CK: Total CK: 223 U/L (ref 38–234)

## 2015-01-20 MED ORDER — ACETAMINOPHEN 325 MG PO TABS
650.0000 mg | ORAL_TABLET | Freq: Four times a day (QID) | ORAL | Status: DC | PRN
  Administered 2015-01-21 – 2015-01-27 (×7): 650 mg via ORAL
  Filled 2015-01-20 (×7): qty 2

## 2015-01-20 MED ORDER — FERROUS SULFATE 325 (65 FE) MG PO TABS
325.0000 mg | ORAL_TABLET | Freq: Every day | ORAL | Status: DC
  Administered 2015-01-21 – 2015-01-23 (×3): 325 mg via ORAL
  Filled 2015-01-20 (×4): qty 1

## 2015-01-20 MED ORDER — FLUTICASONE PROPIONATE 50 MCG/ACT NA SUSP: 2.0000 | Freq: Every day | NASAL | Status: DC | PRN

## 2015-01-20 MED ORDER — PIPERACILLIN-TAZOBACTAM 3.375 G IVPB
3.3750 g | Freq: Three times a day (TID) | INTRAVENOUS | Status: DC
  Administered 2015-01-20 – 2015-01-23 (×8): 3.375 g via INTRAVENOUS
  Filled 2015-01-20 (×10): qty 50

## 2015-01-20 MED ORDER — POTASSIUM CHLORIDE 10 MEQ/100ML IV SOLN
10.0000 meq | Freq: Once | INTRAVENOUS | Status: AC
  Administered 2015-01-20: 10 meq via INTRAVENOUS
  Filled 2015-01-20: qty 100

## 2015-01-20 MED ORDER — ATORVASTATIN CALCIUM 20 MG PO TABS
20.0000 mg | ORAL_TABLET | Freq: Every day | ORAL | Status: DC
  Administered 2015-01-21 – 2015-02-01 (×12): 20 mg via ORAL
  Filled 2015-01-20 (×14): qty 1

## 2015-01-20 MED ORDER — ALBUTEROL SULFATE (2.5 MG/3ML) 0.083% IN NEBU: 2.5000 mg | INHALATION_SOLUTION | RESPIRATORY_TRACT | Status: DC | PRN

## 2015-01-20 MED ORDER — ONDANSETRON HCL 4 MG/2ML IJ SOLN
4.0000 mg | Freq: Four times a day (QID) | INTRAMUSCULAR | Status: DC | PRN
  Administered 2015-01-29 – 2015-02-01 (×2): 4 mg via INTRAVENOUS
  Filled 2015-01-20 (×2): qty 2

## 2015-01-20 MED ORDER — HALOPERIDOL LACTATE 5 MG/ML IJ SOLN
2.0000 mg | Freq: Once | INTRAMUSCULAR | Status: AC
  Administered 2015-01-20: 2 mg via INTRAVENOUS
  Filled 2015-01-20: qty 1

## 2015-01-20 MED ORDER — INSULIN ASPART 100 UNIT/ML ~~LOC~~ SOLN
0.0000 [IU] | Freq: Every day | SUBCUTANEOUS | Status: DC
  Administered 2015-02-01: 2 [IU] via SUBCUTANEOUS

## 2015-01-20 MED ORDER — INSULIN ASPART 100 UNIT/ML ~~LOC~~ SOLN
0.0000 [IU] | Freq: Three times a day (TID) | SUBCUTANEOUS | Status: DC
  Administered 2015-01-21 – 2015-01-29 (×10): 1 [IU] via SUBCUTANEOUS
  Administered 2015-01-31: 2 [IU] via SUBCUTANEOUS
  Administered 2015-02-01: 1 [IU] via SUBCUTANEOUS
  Administered 2015-02-01: 5 [IU] via SUBCUTANEOUS
  Administered 2015-02-01: 3 [IU] via SUBCUTANEOUS
  Administered 2015-02-02: 1 [IU] via SUBCUTANEOUS
  Administered 2015-02-02: 3 [IU] via SUBCUTANEOUS

## 2015-01-20 MED ORDER — SODIUM CHLORIDE 0.9 % IV BOLUS (SEPSIS)
1000.0000 mL | Freq: Once | INTRAVENOUS | Status: AC
  Administered 2015-01-20: 1000 mL via INTRAVENOUS

## 2015-01-20 MED ORDER — HYDRALAZINE HCL 50 MG PO TABS
50.0000 mg | ORAL_TABLET | Freq: Three times a day (TID) | ORAL | Status: DC
  Administered 2015-01-20 – 2015-01-27 (×22): 50 mg via ORAL
  Filled 2015-01-20 (×25): qty 1

## 2015-01-20 MED ORDER — SODIUM CHLORIDE 0.9 % IJ SOLN
3.0000 mL | Freq: Two times a day (BID) | INTRAMUSCULAR | Status: DC
  Administered 2015-01-23 – 2015-02-02 (×15): 3 mL via INTRAVENOUS

## 2015-01-20 MED ORDER — ALBUTEROL SULFATE HFA 108 (90 BASE) MCG/ACT IN AERS: 2.0000 | INHALATION_SPRAY | RESPIRATORY_TRACT | Status: DC | PRN

## 2015-01-20 MED ORDER — ACETAMINOPHEN 500 MG PO TABS
1000.0000 mg | ORAL_TABLET | Freq: Once | ORAL | Status: AC
  Administered 2015-01-20: 1000 mg via ORAL
  Filled 2015-01-20: qty 2

## 2015-01-20 MED ORDER — METRONIDAZOLE IN NACL 5-0.79 MG/ML-% IV SOLN
500.0000 mg | Freq: Three times a day (TID) | INTRAVENOUS | Status: DC
  Administered 2015-01-20 – 2015-01-22 (×5): 500 mg via INTRAVENOUS
  Filled 2015-01-20 (×9): qty 100

## 2015-01-20 MED ORDER — SODIUM CHLORIDE 0.9 % IV SOLN
INTRAVENOUS | Status: DC
  Administered 2015-01-23: 20 mL/h via INTRAVENOUS
  Administered 2015-01-23 – 2015-01-24 (×2): via INTRAVENOUS

## 2015-01-20 MED ORDER — ONDANSETRON HCL 4 MG PO TABS
4.0000 mg | ORAL_TABLET | Freq: Four times a day (QID) | ORAL | Status: DC | PRN
  Administered 2015-01-22 – 2015-01-25 (×2): 4 mg via ORAL
  Filled 2015-01-20 (×2): qty 1

## 2015-01-20 MED ORDER — ONDANSETRON HCL 4 MG/2ML IJ SOLN
4.0000 mg | Freq: Once | INTRAMUSCULAR | Status: AC
  Administered 2015-01-20: 4 mg via INTRAVENOUS
  Filled 2015-01-20: qty 2

## 2015-01-20 MED ORDER — MOMETASONE FURO-FORMOTEROL FUM 100-5 MCG/ACT IN AERO
2.0000 | INHALATION_SPRAY | Freq: Two times a day (BID) | RESPIRATORY_TRACT | Status: DC
Start: 2015-01-21 — End: 2015-02-02
  Administered 2015-01-21 – 2015-02-02 (×25): 2 via RESPIRATORY_TRACT
  Filled 2015-01-20: qty 8.8

## 2015-01-20 MED ORDER — VANCOMYCIN HCL IN DEXTROSE 1-5 GM/200ML-% IV SOLN
1000.0000 mg | INTRAVENOUS | Status: DC
  Administered 2015-01-21 – 2015-01-22 (×3): 1000 mg via INTRAVENOUS
  Filled 2015-01-20 (×3): qty 200

## 2015-01-20 MED ORDER — ACETAMINOPHEN 650 MG RE SUPP: 650.0000 mg | Freq: Four times a day (QID) | RECTAL | Status: DC | PRN

## 2015-01-20 MED ORDER — HEPARIN SODIUM (PORCINE) 5000 UNIT/ML IJ SOLN
5000.0000 [IU] | Freq: Three times a day (TID) | INTRAMUSCULAR | Status: DC
  Administered 2015-01-20 – 2015-01-21 (×3): 5000 [IU] via SUBCUTANEOUS
  Filled 2015-01-20 (×4): qty 1

## 2015-01-20 MED ORDER — HYDROCODONE-ACETAMINOPHEN 5-325 MG PO TABS
1.0000 | ORAL_TABLET | Freq: Four times a day (QID) | ORAL | Status: DC | PRN
  Administered 2015-01-30 – 2015-01-31 (×3): 1 via ORAL
  Filled 2015-01-20 (×3): qty 1

## 2015-01-20 MED ORDER — LORAZEPAM 2 MG/ML IJ SOLN
0.5000 mg | Freq: Once | INTRAMUSCULAR | Status: AC
  Administered 2015-01-20: 0.5 mg via INTRAVENOUS
  Filled 2015-01-20: qty 1

## 2015-01-20 MED ORDER — ASPIRIN EC 81 MG PO TBEC
81.0000 mg | DELAYED_RELEASE_TABLET | Freq: Every day | ORAL | Status: DC
  Administered 2015-01-21 – 2015-02-02 (×13): 81 mg via ORAL
  Filled 2015-01-20 (×14): qty 1

## 2015-01-20 NOTE — Progress Notes (Signed)
ANTIBIOTIC CONSULT NOTE - INITIAL  Pharmacy Consult for vancomycin and zosyn Indication: rule out sepsis  Allergies  Allergen Reactions  . Adhesive [Tape] Other (See Comments)    Blisters   . Avelox [Moxifloxacin Hcl In Nacl] Other (See Comments)    GI upset  . Codeine Other (See Comments)    "crazy"   . Guaifenesin Nausea And Vomiting    Takes Mucinex at home without issue  . Latex Swelling  . Oxycodone Nausea And Vomiting    Takes Percocet at home without issue    Patient Measurements: Height: 5' 2.4" (158.5 cm) Weight: 191 lb (86.637 kg) IBW/kg (Calculated) : 51.02 Adjusted Body Weight:   Vital Signs: Temp: 97.9 F (36.6 C) (06/20 2201) Temp Source: Oral (06/20 2201) BP: 160/100 mmHg (06/20 2201) Pulse Rate: 54 (06/20 2201) Intake/Output from previous day:   Intake/Output from this shift: Total I/O In: 1000 [I.V.:1000] Out: -   Labs:  Recent Labs  01/20/15 1459  WBC 7.4  HGB 15.8*  PLT 375  CREATININE 2.45*   Estimated Creatinine Clearance: 23.2 mL/min (by C-G formula based on Cr of 2.45). No results for input(s): VANCOTROUGH, VANCOPEAK, VANCORANDOM, GENTTROUGH, GENTPEAK, GENTRANDOM, TOBRATROUGH, TOBRAPEAK, TOBRARND, AMIKACINPEAK, AMIKACINTROU, AMIKACIN in the last 72 hours.   Microbiology: No results found for this or any previous visit (from the past 720 hour(s)).  Medical History: Past Medical History  Diagnosis Date  . Essential hypertension, benign   . Coronary atherosclerosis of native coronary artery 01/04/2006    Tiny OM1 70-90% ostial stenosis, no other CAD  . COPD (chronic obstructive pulmonary disease)   . Esophageal reflux   . Dyslipidemia   . Gout   . Colon polyps     Tubular adenomatous polyps  . Spinal stenosis of lumbar region   . Bulging lumbar disc   . Chronic diastolic heart failure   . PNA (pneumonia) 2012    With pleural effusion, requiring thoracentesis  . Chronic bronchitis   . On home oxygen therapy   . OSA  (obstructive sleep apnea)     Marginally compliant with CPAP  . Type II diabetes mellitus   . Arthritis   . Chronic lower back pain   . Diverticulosis   . GERD (gastroesophageal reflux disease)   . Anxiety   . Hyperlipidemia   . Sarcoidosis of lung   . CKD (chronic kidney disease) stage 4, GFR 15-29 ml/min   . IBS (irritable bowel syndrome)   . Cataracts, both eyes   . ZRAQTMAU(633.3)     Assessment: 69 y.o. female who presents to Urgent Care today for fall. For the past month or so patient has had chronic vomiting and diarrhea. New orders to start IV vancomycin and zosyn for possible sepsis.  No fevers, wbc 7.4, scr 2.4.Lactic acid 3.0  Goal of Therapy:  Vancomycin trough level 15-20 mcg/ml  Plan:  Measure antibiotic drug levels at steady state Follow up culture results Vancomycin 1g IV q24 hours Zosyn 3.375g IV q8 hours - infuse over 4 hours  Erin Hearing PharmD., BCPS Clinical Pharmacist Pager [phone] 01/20/2015 10:47 PM

## 2015-01-20 NOTE — ED Notes (Signed)
Pt being transferred to ED via our shuttle for further workup for N/V/D and abdominal pain.  Report called to Reita Cliche First RN ED.

## 2015-01-20 NOTE — ED Provider Notes (Signed)
[name] is a 69 y.o. female who presents to Urgent Care today for fall. For the past month or so patient has had chronic vomiting and diarrhea. This is currently being evaluated so far within normal gastric emptying study and CT scan that showed no significant acute abnormalities. Her symptoms are persistent. She is hardly able to eat due to her vomiting and diarrhea. She is unable to take her medications as well. She has been getting worse recently and feels quite weak. This morning she had a fall. She's not sure how she fell exactly. She thinks perhaps she tripped or just lost her balance. She hit her left shoulder as she slid to the ground. She does not think that she has lost consciousness. However her family is quite concerned about her. The suspect that she is unable to care for herself at home and they are not able to care for her at home.   Past Medical History  Diagnosis Date  . Essential hypertension, benign   . Coronary atherosclerosis of native coronary artery 01/04/2006    Tiny OM1 70-90% ostial stenosis, no other CAD  . COPD (chronic obstructive pulmonary disease)   . Esophageal reflux   . Dyslipidemia   . Gout   . Colon polyps     Tubular adenomatous polyps  . Spinal stenosis of lumbar region   . Bulging lumbar disc   . Chronic diastolic heart failure   . PNA (pneumonia) 2012    With pleural effusion, requiring thoracentesis  . Chronic bronchitis   . On home oxygen therapy   . OSA (obstructive sleep apnea)     Marginally compliant with CPAP  . Type II diabetes mellitus   . Arthritis   . Chronic lower back pain   . Diverticulosis   . GERD (gastroesophageal reflux disease)   . Anxiety   . Hyperlipidemia   . Sarcoidosis of lung   . CKD (chronic kidney disease) stage 4, GFR 15-29 ml/min   . IBS (irritable bowel syndrome)   . Cataracts, both eyes   . YTKPTWSF(681.2)    Past Surgical History  Procedure Laterality Date  . Vesicovaginal fistula closure w/  total  abdominal hysterectomy  1992  . Bilateral total knee replacements Bilateral Rt=5/04 & Lft=1/09    by DrAlusio  . 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 Zella Richer  . Appendectomy  1957  . Tonsillectomy  1968  . Cholecystectomy  1980's  . Abdominal hysterectomy  1992  . Reduction mammaplasty Bilateral 1980's  . Cardiac catheterization  1990's  . Thoracentesis  2012  . Joint replacement    . Av fistula placement Left 12/31/2013    Procedure: ARTERIOVENOUS (AV) FISTULA CREATION;  Surgeon: Elam Dutch, MD;  Location: [facility];  Service: Vascular;  Laterality: Left;  . Colonoscopy w/ biopsies and polypectomy    . Av fistula placement Left 03/18/2014    Procedure: ARTERIOVENOUS (AV) FISTULA CREATION- LEFT BRACHIOCEPHALIC ;  Surgeon: Elam Dutch, MD;  Location: [facility];  Service: Vascular;  Laterality: Left;  . Ligation of arteriovenous  fistula Left 03/18/2014    Procedure: LIGATION OF ARTERIOVENOUS  FISTULA- LEFT RADIOCEPHALIC;  Surgeon: Elam Dutch, MD;  Location: [facility];  Service: Vascular;  Laterality: Left;   History  Substance Use Topics  . Smoking status: Never Smoker   . Smokeless tobacco: Never Used  . Alcohol Use: No   ROS as above Medications: No current facility-administered medications for this encounter.  Current Outpatient Prescriptions  Medication Sig Dispense Refill  . acetaminophen (TYLENOL) 500 MG tablet Take 1,000 mg by mouth 2 (two) times daily as needed (pain).     Marland Kitchen albuterol (PROVENTIL HFA;VENTOLIN HFA) 108 (90 BASE) MCG/ACT inhaler Inhale 2 puffs into the lungs every 4 (four) hours as needed for wheezing or shortness of breath.     Marland Kitchen albuterol (PROVENTIL) (2.5 MG/3ML) 0.083% nebulizer solution Take 2.5 mg by nebulization every 2 (two) hours as needed for wheezing or shortness of breath.    . Alpha-D-Galactosidase (BEANO PO) Take 1 tablet by mouth daily.    Marland Kitchen Apremilast (OTEZLA) 30 MG TABS Take 30 mg by mouth daily.     Marland Kitchen aspirin EC 81 MG tablet Take 81 mg by  mouth daily.    Marland Kitchen atorvastatin (LIPITOR) 20 MG tablet Take 20 mg by mouth at bedtime.    . clotrimazole-betamethasone (LOTRISONE) cream Apply 1 application topically 2 (two) times daily as needed (eczema).     . esomeprazole (NEXIUM) 40 MG capsule Take 1 capsule (40 mg total) by mouth 2 (two) times daily. 60 capsule 3  . ferrous sulfate 325 (65 FE) MG tablet Take 325 mg by mouth daily with breakfast.    . fluocinonide cream (LIDEX) 7.16 % Apply 1 application topically 2 (two) times daily.    . fluticasone (FLONASE) 50 MCG/ACT nasal spray Place 2 sprays into both nostrils at bedtime.    . Fluticasone-Salmeterol (ADVAIR) 250-50 MCG/DOSE AEPB Inhale 1 puff into the lungs 2 (two) times daily. 180 each 3  . furosemide (LASIX) 80 MG tablet Take 2 tablets (160 mg total) by mouth 2 (two) times daily. 120 tablet 0  . hydrALAZINE (APRESOLINE) 50 MG tablet Take 1 tablet (50 mg total) by mouth 3 (three) times daily. 90 tablet 0  . HYDROcodone-acetaminophen (NORCO/VICODIN) 5-325 MG per tablet Take 1 tablet by mouth every 6 (six) hours as needed for moderate pain. 15 tablet 0  . Insulin Glargine 300 UNIT/ML SOPN Inject 70 Units into the skin daily.    . insulin lispro (HUMALOG KWIKPEN) 100 UNIT/ML KiwkPen Inject 0.1 mLs (10 Units total) into the skin 3 (three) times daily. Take 10 units  (plus additional 4-12 units based on sliding scale) with breakfast, 10 units (plus additional 4-12 units based on sliding scale) with and 10 units (+ plus additional 4-12 units based on sliding scale) with dinner.Inject 35-40 Units into the skin 3 (three) times daily. 15 mL 11  . LORazepam (ATIVAN) 1 MG tablet Take 1 mg by mouth at bedtime as needed for sleep.     . metolazone (ZAROXOLYN) 5 MG tablet Take 5 mg by mouth daily as needed.    . metoprolol succinate (TOPROL-XL) 25 MG 24 hr tablet Take 12.5 mg by mouth 2 (two) times daily.    . Multiple Vitamins-Minerals (MULTIVITAMIN WITH MINERALS) tablet Take 1 tablet by mouth daily.     . ondansetron (ZOFRAN) 4 MG tablet Take 1 tablet (4 mg total) by mouth every 8 (eight) hours as needed for nausea or vomiting. 15 tablet 0  . ONETOUCH DELICA LANCETS 96V MISC 1 each 4 (four) times daily.    . pregabalin (LYRICA) 100 MG capsule Take 100 mg by mouth at bedtime.     . promethazine (PHENERGAN) 25 MG tablet Take 1 tablet (25 mg total) by mouth every 4 (four) hours. 30 tablet 1  . triamcinolone cream (KENALOG) 0.1 % Apply 1 application topically 2 (two) times daily.  Allergies  Allergen Reactions  . Adhesive [Tape] Other (See Comments)    Blisters   . Avelox [Moxifloxacin Hcl In Nacl] Other (See Comments)    GI upset  . Codeine Other (See Comments)    "crazy"   . Guaifenesin Nausea And Vomiting    Takes Mucinex at home without issue  . Latex Swelling  . Oxycodone Nausea And Vomiting    Takes Percocet at home without issue     Exam:  BP 164/91 mmHg  Pulse 82  Temp(Src) 97.5 F (36.4 C) (Oral)  Resp 16  SpO2 100% Gen: Well NAD HEENT: EOMI,  MMM Lungs: Normal work of breathing. CTABL Heart: RRR no MRG Abd: NABS, Soft. Nondistended, Nontender Exts: Brisk capillary refill, warm and well perfused.  Neck: Nontender to midline normal neck range of motion Upper extremity strength is intact throughout. Sensation is intact throughout upper extremities. Shoulders are nontender with normal arm motion.  No results found for this or any previous visit (from the past 24 hour(s)). No results found.  Assessment and Plan: 69 y.o. female with chronic vomiting and diarrhea with weakness and fall. The fundamental issue here is the patient has chronic vomiting which has led to weakness. She has begun falling and is having difficulty caring for herself at home. Plan to transfer to the emergency room for further evaluation and management. She may require admission and skilled nursing facility discharge.   Discussed warning signs or symptoms. Please see discharge instructions.  Patient expresses understanding.     Gregor Hams, MD 01/20/15 [phone]

## 2015-01-20 NOTE — ED Notes (Signed)
Pt states that she has had N/V/D for several weeks. Pt states that she has been seen here multiple times for same. Pt reports tenderness to abdomen. Reports 2 episodes of diarrhea today.

## 2015-01-20 NOTE — ED Notes (Signed)
Pt has a 3-4 week history of N/V/D, weakness, and abdominal pain when eating.  Pt fell today, tripping over her walker, from weakness.  Pt states that no one is able to find out what is wrong with her, but she knows there is something wrong.  She complains of intermittent h/a above her right eye/head.  She denies any vision changes, or dizziness and no blood in her vomit or stool.

## 2015-01-20 NOTE — ED Notes (Signed)
Pt did not have CT scan. Pt anxious about IV bothering her. RN assess. No drainage noted, IV intact. No swelling. Will slow rate of potassium

## 2015-01-20 NOTE — ED Notes (Signed)
Pt very anxious, heavy breathing, pt repeating herself. Notified MD. GAve ativan and haldol to help calm pt

## 2015-01-20 NOTE — ED Notes (Signed)
Notified CT that pt is going to try again. Pt ready for transport

## 2015-01-20 NOTE — ED Notes (Signed)
Dr.Patel at bedside to see pt; pt lethargic at this time. Dr.Patel will see patient on floor

## 2015-01-20 NOTE — ED Provider Notes (Signed)
CSN: [id number]     Arrival date & time 01/20/15  1424 History   First MD Initiated Contact with Patient 01/20/15 1632     Chief Complaint  Patient presents with  . Emesis  . Diarrhea     (Consider location/radiation/quality/duration/timing/severity/associated sxs/prior Treatment) Patient is a 69 y.o. female presenting with vomiting and diarrhea.  Emesis Severity:  Moderate Duration:  1 month Timing:  Constant Number of daily episodes:  2-3 Quality:  Stomach contents How soon after eating does vomiting occur:  30 minutes Progression:  Unchanged Chronicity:  New Recent urination:  Normal Relieved by:  Antiemetics Worsened by:  Nothing tried Ineffective treatments:  None tried Associated symptoms: abdominal pain (diffuse) and diarrhea   Abdominal pain:    Location:  Generalized   Quality:  Aching   Severity:  Moderate   Onset quality:  Gradual   Duration:  6 days   Timing:  Intermittent   Progression:  Unchanged Risk factors: diabetes and prior abdominal surgery (splenectomy, chole, hyterectomy)   Risk factors comment:  CKD, has appt with GI July 20 at [facility] Diarrhea Associated symptoms: abdominal pain (diffuse) and vomiting     Past Medical History  Diagnosis Date  . Essential hypertension, benign   . Coronary atherosclerosis of native coronary artery 01/04/2006    Tiny OM1 70-90% ostial stenosis, no other CAD  . COPD (chronic obstructive pulmonary disease)   . Esophageal reflux   . Dyslipidemia   . Gout   . Colon polyps     Tubular adenomatous polyps  . Spinal stenosis of lumbar region   . Bulging lumbar disc   . Chronic diastolic heart failure   . PNA (pneumonia) 2012    With pleural effusion, requiring thoracentesis  . Chronic bronchitis   . On home oxygen therapy   . OSA (obstructive sleep apnea)     Marginally compliant with CPAP  . Type II diabetes mellitus   . Arthritis   . Chronic lower back pain   . Diverticulosis   . GERD (gastroesophageal  reflux disease)   . Anxiety   . Hyperlipidemia   . Sarcoidosis of lung   . CKD (chronic kidney disease) stage 4, GFR 15-29 ml/min   . IBS (irritable bowel syndrome)   . Cataracts, both eyes   . OEUMPNTI(144.3)    Past Surgical History  Procedure Laterality Date  . Vesicovaginal fistula closure w/  total abdominal hysterectomy  1992  . Bilateral total knee replacements Bilateral Rt=5/04 & Lft=1/09    by DrAlusio  . 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 Zella Richer  . Appendectomy  1957  . Tonsillectomy  1968  . Cholecystectomy  1980's  . Abdominal hysterectomy  1992  . Reduction mammaplasty Bilateral 1980's  . Cardiac catheterization  1990's  . Thoracentesis  2012  . Joint replacement    . Av fistula placement Left 12/31/2013    Procedure: ARTERIOVENOUS (AV) FISTULA CREATION;  Surgeon: Elam Dutch, MD;  Location: [place];  Service: Vascular;  Laterality: Left;  . Colonoscopy w/ biopsies and polypectomy    . Av fistula placement Left 03/18/2014    Procedure: ARTERIOVENOUS (AV) FISTULA CREATION- LEFT BRACHIOCEPHALIC ;  Surgeon: Elam Dutch, MD;  Location[facility];  Service: Vascular;  Laterality: Left;  . Ligation of arteriovenous  fistula Left 03/18/2014    Procedure: LIGATION OF ARTERIOVENOUS  FISTULA- LEFT RADIOCEPHALIC;  Surgeon: Elam Dutch, MD;  Location: [place];  Service: Vascular;  Laterality: Left;  Family History  Problem Relation Age of Onset  . Diabetes Mother   . Heart disease Mother   . Hyperlipidemia Mother   . Varicose Veins Mother   . Breast cancer Maternal Aunt   . Heart disease Father   . Deep vein thrombosis Father   . Hyperlipidemia Father   . Heart disease Sister     before age 54  . Cancer Sister   . Diabetes Sister   . Hyperlipidemia Sister   . Heart attack Sister   . Heart disease Brother   . Diabetes Brother   . Hyperlipidemia Brother   . Heart attack Brother   . Hyperlipidemia Sister    History  Substance Use Topics  . Smoking status:  Never Smoker   . Smokeless tobacco: Never Used  . Alcohol Use: No   OB History    No data available     Review of Systems  Gastrointestinal: Positive for vomiting, abdominal pain (diffuse) and diarrhea.  Neurological: Positive for weakness (generalized).  All other systems reviewed and are negative.     Allergies  Adhesive; Avelox; Codeine; Guaifenesin; Latex; and Oxycodone  Home Medications   Prior to Admission medications   Medication Sig Start Date End Date Taking? Authorizing Provider  clotrimazole-betamethasone (LOTRISONE) cream Apply 1 application topically 2 (two) times daily as needed (eczema).  01/18/14  Yes Historical Provider, MD  esomeprazole (NEXIUM) 40 MG capsule Take 1 capsule (40 mg total) by mouth 2 (two) times daily. 01/01/15  Yes Irene Shipper, MD  ferrous sulfate 325 (65 FE) MG tablet Take 325 mg by mouth daily with breakfast.   Yes Historical Provider, MD  fluocinonide cream (LIDEX) 5.39 % Apply 1 application topically 2 (two) times daily.   Yes Historical Provider, MD  fluticasone (FLONASE) 50 MCG/ACT nasal spray Place 2 sprays into both nostrils daily as needed for allergies or rhinitis.    Yes Historical Provider, MD  Fluticasone-Salmeterol (ADVAIR) 250-50 MCG/DOSE AEPB Inhale 1 puff into the lungs 2 (two) times daily. 09/25/14  Yes Noralee Space, MD  furosemide (LASIX) 80 MG tablet Take 2 tablets (160 mg total) by mouth 2 (two) times daily. 09/09/14  Yes Albertine Patricia, MD  hydrALAZINE (APRESOLINE) 50 MG tablet Take 1 tablet (50 mg total) by mouth 3 (three) times daily. 09/09/14  Yes Albertine Patricia, MD  HYDROcodone-acetaminophen (NORCO/VICODIN) 5-325 MG per tablet Take 1 tablet by mouth every 6 (six) hours as needed for moderate pain. 12/21/14  Yes Christopher Lawyer, PA-C  Insulin Glargine 300 UNIT/ML SOPN Inject 70 Units into the skin daily.   Yes Historical Provider, MD  insulin lispro (HUMALOG KWIKPEN) 100 UNIT/ML KiwkPen Inject 0.1 mLs (10 Units total)  into the skin 3 (three) times daily. Take 10 units  (plus additional 4-12 units based on sliding scale) with breakfast, 10 units (plus additional 4-12 units based on sliding scale) with and 10 units (+ plus additional 4-12 units based on sliding scale) with dinner.Inject 35-40 Units into the skin 3 (three) times daily. 09/09/14  Yes Albertine Patricia, MD  metolazone (ZAROXOLYN) 5 MG tablet Take 5 mg by mouth daily as needed (high blood pressure).    Yes Historical Provider, MD  metoprolol succinate (TOPROL-XL) 25 MG 24 hr tablet Take 12.5 mg by mouth 2 (two) times daily.   Yes Historical Provider, MD  Multiple Vitamins-Minerals (MULTIVITAMIN WITH MINERALS) tablet Take 1 tablet by mouth daily.   Yes Historical Provider, MD  ondansetron (ZOFRAN) 4 MG tablet  Take 1 tablet (4 mg total) by mouth every 8 (eight) hours as needed for nausea or vomiting. 12/31/14  Yes Robynn Pane, MD  pregabalin (LYRICA) 100 MG capsule Take 100 mg by mouth at bedtime.    Yes Historical Provider, MD  promethazine (PHENERGAN) 25 MG tablet Take 1 tablet (25 mg total) by mouth every 4 (four) hours. 01/01/15  Yes Irene Shipper, MD  triamcinolone cream (KENALOG) 0.1 % Apply 1 application topically 2 (two) times daily.   Yes Historical Provider, MD  acetaminophen (TYLENOL) 500 MG tablet Take 1,000 mg by mouth 2 (two) times daily as needed (pain).     Historical Provider, MD  albuterol (PROVENTIL HFA;VENTOLIN HFA) 108 (90 BASE) MCG/ACT inhaler Inhale 2 puffs into the lungs every 4 (four) hours as needed for wheezing or shortness of breath.     Historical Provider, MD  albuterol (PROVENTIL) (2.5 MG/3ML) 0.083% nebulizer solution Take 2.5 mg by nebulization every 2 (two) hours as needed for wheezing or shortness of breath.    Historical Provider, MD  Alpha-D-Galactosidase (BEANO PO) Take 1 tablet by mouth daily.    Historical Provider, MD  Apremilast (OTEZLA) 30 MG TABS Take 30 mg by mouth daily.     Historical Provider, MD  aspirin EC 81 MG  tablet Take 81 mg by mouth daily.    Historical Provider, MD  atorvastatin (LIPITOR) 20 MG tablet Take 20 mg by mouth at bedtime.    Historical Provider, MD   BP 160/100 mmHg  Pulse 54  Temp(Src) 97.9 F (36.6 C) (Oral)  Resp 20  Ht 5' 2.4" (1.585 m)  Wt 191 lb (86.637 kg)  BMI 34.49 kg/m2  SpO2 96% Physical Exam  Constitutional: She is oriented to person, place, and time. She appears well-developed and well-nourished. No distress.  HENT:  Head: Normocephalic.  Eyes: Conjunctivae are normal.  Neck: Neck supple. No tracheal deviation present.  Cardiovascular: Normal rate, regular rhythm and normal heart sounds.   Pulmonary/Chest: Effort normal and breath sounds normal. No respiratory distress.  Abdominal: Soft. Normal appearance. She exhibits no distension. There is tenderness (diffuse mild).  Neurological: She is alert and oriented to person, place, and time. GCS eye subscore is 4. GCS verbal subscore is 5. GCS motor subscore is 6.  Skin: Skin is warm and dry.  Psychiatric: She has a normal mood and affect.    ED Course  Procedures (including critical care time) Labs Review Labs Reviewed  CBC WITH DIFFERENTIAL/PLATELET - Abnormal; Notable for the following:    RBC 5.42 (*)    Hemoglobin 15.8 (*)    HCT 48.1 (*)    RDW 15.7 (*)    Monocytes Relative 17 (*)    Monocytes Absolute 1.3 (*)    All other components within normal limits  COMPREHENSIVE METABOLIC PANEL - Abnormal; Notable for the following:    Potassium 3.1 (*)    Glucose, Bld 128 (*)    Creatinine, Ser 2.45 (*)    Calcium 8.3 (*)    Total Protein 5.6 (*)    Albumin 2.1 (*)    ALT 11 (*)    Alkaline Phosphatase 304 (*)    GFR calc non Af Amer 19 (*)    GFR calc Af Amer 23 (*)    All other components within normal limits  LIPASE, BLOOD - Abnormal; Notable for the following:    Lipase 14 (*)    All other components within normal limits  URINALYSIS, ROUTINE W REFLEX MICROSCOPIC (NOT AT  [facility]) - Abnormal;  Notable for the following:    Glucose, UA 100 (*)    Hgb urine dipstick MODERATE (*)    Protein, ur >300 (*)    All other components within normal limits  URINE MICROSCOPIC-ADD ON - Abnormal; Notable for the following:    Squamous Epithelial / LPF FEW (*)    All other components within normal limits  I-STAT CG4 LACTIC ACID, ED - Abnormal; Notable for the following:    Lactic Acid, Venous 3.07 (*)    All other components within normal limits  CULTURE, BLOOD (ROUTINE X 2)  CULTURE, BLOOD (ROUTINE X 2)  CK  GLUCOSE, CAPILLARY  GI PATHOGEN PANEL BY PCR, STOOL  LACTIC ACID, PLASMA  PROCALCITONIN  PROTIME-INR  APTT  HEMOGLOBIN A1C  COMPREHENSIVE METABOLIC PANEL    Imaging Review Ct Abdomen Pelvis Wo Contrast  01/20/2015   CLINICAL DATA:  69 year old female with 3-4 week history of nausea, vomiting, diarrhea, weakness and abdominal pain while eating. History of trauma from a fall today.  EXAM: CT ABDOMEN AND PELVIS WITHOUT CONTRAST  TECHNIQUE: Multidetector CT imaging of the abdomen and pelvis was performed following the standard protocol without IV contrast.  COMPARISON:  CT of the abdomen and pelvis 12/21/2014.  FINDINGS: Lower chest: Mild cylindrical bronchiectasis, thickening of the peribronchovascular interstitium and architectural distortion in the lower lobes of the lungs bilaterally, which could be post infectious, or could be related to recurrent aspiration. Mild cardiomegaly. Atherosclerotic calcifications in the left main and left anterior descending coronary artery.  Hepatobiliary: Status post cholecystectomy. Irregular area of low attenuation and calcification in the posterior aspect of the right lobe of the liver is similar to remote prior studies dating back to at least 08/25/2013, favored to be related to sequela of remote trauma. No new hepatic lesions.  Pancreas: No definite pancreatic mass or surrounding inflammatory changes on today's noncontrast CT examination.  Spleen: Status  post splenectomy. Multiple small splenules noted in the left side of the abdomen.  Adrenals/Urinary Tract: The unenhanced appearance of the adrenal glands bilaterally and the right kidney is normal. Mild scarring along the lateral aspect of the upper pole of the left kidney, likely related to prior splenectomy. Left kidney is otherwise normal in appearance. No hydroureteronephrosis. Urinary bladder is normal in appearance.  Stomach/Bowel: The unenhanced appearance of the stomach is normal. No pathologic dilatation of small bowel or colon. A few scattered colonic diverticulae are noted, without surrounding inflammatory changes to suggest an acute diverticulitis at this time.  Vascular/Lymphatic: Moderate atherosclerosis throughout the abdominal and pelvic vasculature, without definite aneurysm. No lymphadenopathy noted in the abdomen or pelvis on today's noncontrast CT examination.  Reproductive: Status post hysterectomy.  Ovaries are atrophic.  Other: No significant volume of ascites.  No pneumoperitoneum.  Musculoskeletal: There are no aggressive appearing lytic or blastic lesions noted in the visualized portions of the skeleton.  IMPRESSION: 1. No acute findings in the abdomen or pelvis on today's noncontrast CT examination to account for the patient's symptoms. 2. The appearance of the lower lobes of the lungs could suggest post infectious scarring, but could also be seen in the setting of recurrent aspiration. Clinical correlation is suggested. 3. Mild cardiomegaly. 4. Atherosclerosis, including left anterior descending coronary artery disease. Please note that although the presence of coronary artery calcium documents the presence of coronary artery disease, the severity of this disease and any potential stenosis cannot be assessed on this non-gated CT examination. Assessment for potential risk factor modification, dietary  therapy or pharmacologic therapy may be warranted, if clinically indicated. 5. Status  post splenectomy. Multiple splenules are noted throughout the left side of the abdomen. 6. Status post cholecystectomy. 7. Additional incidental findings, as above.   Electronically Signed   By: Vinnie Langton M.D.   On: 01/20/2015 19:38     EKG Interpretation None      MDM   Final diagnoses:  Lactic acidosis    69 year old female presents from home with deconditioning over the last several days. She has been having ongoing nausea vomiting diarrhea and abdominal pain for the last month for which she is due to see gastroenterology next month at [place]. She is extremely anxious on arrival. She appears to be clinically dehydrated. She does have a lactic acidosis and she states she has been unable to get out of bed and even slid to the floor today. Her family members are concerned that she is not able to take care of herself. Her CK is mildly elevated compared to priors. Internal medicine was consulted for admission and will evaluate the patient in the emergency department    Leo Grosser, MD 01/21/15 [place], MD 01/21/15 2151

## 2015-01-21 ENCOUNTER — Encounter (HOSPITAL_COMMUNITY): Payer: Self-pay | Admitting: General Practice

## 2015-01-21 ENCOUNTER — Inpatient Hospital Stay (HOSPITAL_COMMUNITY): Payer: Medicare Other

## 2015-01-21 DIAGNOSIS — R197 Diarrhea, unspecified: Secondary | ICD-10-CM | POA: Diagnosis present

## 2015-01-21 DIAGNOSIS — I10 Essential (primary) hypertension: Secondary | ICD-10-CM

## 2015-01-21 DIAGNOSIS — G43A0 Cyclical vomiting, not intractable: Secondary | ICD-10-CM

## 2015-01-21 DIAGNOSIS — E43 Unspecified severe protein-calorie malnutrition: Secondary | ICD-10-CM | POA: Diagnosis present

## 2015-01-21 DIAGNOSIS — R55 Syncope and collapse: Secondary | ICD-10-CM

## 2015-01-21 DIAGNOSIS — A419 Sepsis, unspecified organism: Secondary | ICD-10-CM | POA: Diagnosis present

## 2015-01-21 DIAGNOSIS — E872 Acidosis, unspecified: Secondary | ICD-10-CM

## 2015-01-21 DIAGNOSIS — E1122 Type 2 diabetes mellitus with diabetic chronic kidney disease: Secondary | ICD-10-CM

## 2015-01-21 DIAGNOSIS — N189 Chronic kidney disease, unspecified: Secondary | ICD-10-CM

## 2015-01-21 DIAGNOSIS — I5032 Chronic diastolic (congestive) heart failure: Secondary | ICD-10-CM

## 2015-01-21 DIAGNOSIS — I5042 Chronic combined systolic (congestive) and diastolic (congestive) heart failure: Secondary | ICD-10-CM

## 2015-01-21 DIAGNOSIS — E860 Dehydration: Secondary | ICD-10-CM

## 2015-01-21 HISTORY — DX: Acidosis, unspecified: E87.20

## 2015-01-21 HISTORY — DX: Acidosis: E87.2

## 2015-01-21 LAB — CBC WITH DIFFERENTIAL/PLATELET
BASOS ABS: 0 10*3/uL (ref 0.0–0.1)
Basophils Relative: 0 % (ref 0–1)
EOS PCT: 3 % (ref 0–5)
Eosinophils Absolute: 0.2 10*3/uL (ref 0.0–0.7)
HEMATOCRIT: 42.7 % (ref 36.0–46.0)
HEMOGLOBIN: 14 g/dL (ref 12.0–15.0)
LYMPHS PCT: 21 % (ref 12–46)
Lymphs Abs: 1.4 10*3/uL (ref 0.7–4.0)
MCH: 28.7 pg (ref 26.0–34.0)
MCHC: 32.8 g/dL (ref 30.0–36.0)
MCV: 87.5 fL (ref 78.0–100.0)
Monocytes Absolute: 1.3 10*3/uL — ABNORMAL HIGH (ref 0.1–1.0)
Monocytes Relative: 20 % — ABNORMAL HIGH (ref 3–12)
NEUTROS PCT: 56 % (ref 43–77)
Neutro Abs: 3.7 10*3/uL (ref 1.7–7.7)
Platelets: 353 10*3/uL (ref 150–400)
RBC: 4.88 MIL/uL (ref 3.87–5.11)
RDW: 15.5 % (ref 11.5–15.5)
WBC: 6.7 10*3/uL (ref 4.0–10.5)

## 2015-01-21 LAB — COMPREHENSIVE METABOLIC PANEL
ALBUMIN: 1.8 g/dL — AB (ref 3.5–5.0)
ALT: 11 U/L — AB (ref 14–54)
ANION GAP: 8 (ref 5–15)
AST: 59 U/L — ABNORMAL HIGH (ref 15–41)
Alkaline Phosphatase: 257 U/L — ABNORMAL HIGH (ref 38–126)
BUN: 15 mg/dL (ref 6–20)
CO2: 18 mmol/L — ABNORMAL LOW (ref 22–32)
Calcium: 7.6 mg/dL — ABNORMAL LOW (ref 8.9–10.3)
Chloride: 111 mmol/L (ref 101–111)
Creatinine, Ser: 2.05 mg/dL — ABNORMAL HIGH (ref 0.44–1.00)
GFR calc Af Amer: 28 mL/min — ABNORMAL LOW (ref >60)
GFR calc non Af Amer: 24 mL/min — ABNORMAL LOW (ref >60)
Glucose, Bld: 95 mg/dL (ref 65–99)
Potassium: 3.7 mmol/L (ref 3.5–5.1)
SODIUM: 137 mmol/L (ref 135–145)
TOTAL PROTEIN: 4.5 g/dL — AB (ref 6.5–8.1)
Total Bilirubin: 0.7 mg/dL (ref 0.3–1.2)

## 2015-01-21 LAB — PROCALCITONIN: PROCALCITONIN: 20.18 ng/mL

## 2015-01-21 LAB — GLUCOSE, CAPILLARY
GLUCOSE-CAPILLARY: 113 mg/dL — AB (ref 65–99)
GLUCOSE-CAPILLARY: 124 mg/dL — AB (ref 65–99)
GLUCOSE-CAPILLARY: 109 mg/dL — AB (ref 65–99)
Glucose-Capillary: 139 mg/dL — ABNORMAL HIGH (ref 65–99)

## 2015-01-21 LAB — BRAIN NATRIURETIC PEPTIDE: B NATRIURETIC PEPTIDE 5: 1056.8 pg/mL — AB (ref 0.0–100.0)

## 2015-01-21 LAB — MAGNESIUM
Magnesium: 0.9 mg/dL — CL (ref 1.7–2.4)
Magnesium: 1.4 mg/dL — ABNORMAL LOW (ref 1.7–2.4)

## 2015-01-21 LAB — PROTIME-INR
INR: 1.45 (ref 0.00–1.49)
PROTHROMBIN TIME: 17.7 s — AB (ref 11.6–15.2)

## 2015-01-21 LAB — TROPONIN I
TROPONIN I: 5.64 ng/mL — AB (ref <0.031)
Troponin I: 5.37 ng/mL (ref <0.031)

## 2015-01-21 LAB — LACTIC ACID, PLASMA: Lactic Acid, Venous: 1.2 mmol/L (ref 0.5–2.0)

## 2015-01-21 MED ORDER — WARFARIN SODIUM 5 MG PO TABS
5.0000 mg | ORAL_TABLET | Freq: Once | ORAL | Status: AC
  Administered 2015-01-21: 5 mg via ORAL
  Filled 2015-01-21: qty 1

## 2015-01-21 MED ORDER — HEPARIN BOLUS VIA INFUSION
2500.0000 [IU] | Freq: Once | INTRAVENOUS | Status: AC
  Administered 2015-01-21: 2500 [IU] via INTRAVENOUS
  Filled 2015-01-21: qty 2500

## 2015-01-21 MED ORDER — FAMOTIDINE IN NACL 20-0.9 MG/50ML-% IV SOLN
20.0000 mg | INTRAVENOUS | Status: DC
  Administered 2015-01-21 – 2015-01-22 (×2): 20 mg via INTRAVENOUS
  Filled 2015-01-21 (×3): qty 50

## 2015-01-21 MED ORDER — HEPARIN (PORCINE) IN NACL 100-0.45 UNIT/ML-% IJ SOLN
1150.0000 [IU]/h | INTRAMUSCULAR | Status: DC
  Administered 2015-01-21 – 2015-01-24 (×4): 1150 [IU]/h via INTRAVENOUS
  Filled 2015-01-21 (×5): qty 250

## 2015-01-21 MED ORDER — PERFLUTREN LIPID MICROSPHERE
1.0000 mL | INTRAVENOUS | Status: AC | PRN
  Administered 2015-01-21: 4 mL via INTRAVENOUS
  Filled 2015-01-21: qty 10

## 2015-01-21 MED ORDER — MAGNESIUM SULFATE 2 GM/50ML IV SOLN
2.0000 g | Freq: Once | INTRAVENOUS | Status: AC
  Administered 2015-01-21: 2 g via INTRAVENOUS
  Filled 2015-01-21: qty 50

## 2015-01-21 MED ORDER — GI COCKTAIL ~~LOC~~
30.0000 mL | Freq: Three times a day (TID) | ORAL | Status: DC | PRN
  Filled 2015-01-21: qty 30

## 2015-01-21 MED ORDER — PATIENT'S GUIDE TO USING COUMADIN BOOK
Freq: Once | Status: AC
  Administered 2015-01-21: 20:00:00
  Filled 2015-01-21: qty 1

## 2015-01-21 MED ORDER — WARFARIN VIDEO
Freq: Once | Status: AC
  Administered 2015-01-22: 1

## 2015-01-21 MED ORDER — WARFARIN - PHARMACIST DOSING INPATIENT
Freq: Every day | Status: DC
  Administered 2015-01-22 – 2015-01-29 (×4)
  Administered 2015-01-30: 1
  Administered 2015-01-31 – 2015-02-01 (×2)

## 2015-01-21 MED ORDER — ISOSORBIDE MONONITRATE ER 30 MG PO TB24
30.0000 mg | ORAL_TABLET | Freq: Every day | ORAL | Status: DC
  Administered 2015-01-21 – 2015-01-27 (×7): 30 mg via ORAL
  Filled 2015-01-21 (×8): qty 1

## 2015-01-21 MED ORDER — CETYLPYRIDINIUM CHLORIDE 0.05 % MT LIQD
7.0000 mL | Freq: Two times a day (BID) | OROMUCOSAL | Status: DC
  Administered 2015-01-21 – 2015-02-01 (×13): 7 mL via OROMUCOSAL

## 2015-01-21 NOTE — Consult Note (Addendum)
[facility] Consult: 2:58 PM 01/21/2015  LOS: 1 day    Referring Provider: Dr Dyann Kief  Primary Care Physician:  Antony Blackbird, MD Primary Gastroenterologist:  Dr. Henrene Pastor     Reason for Consultation:  N/V/D, dehydration   HPI: [name] is a 69 y.o. female.  PMH ESRD, IDDM.  Oxygen dependent COPD.  OSA but mostly non-compliant with CPAP.  Pulmonary sarcoidosis.  Hx colon polyps and diverticulosis. Colonic AVM.  Iron deficiency and Hgb 10.9 in early 08/2014.  On po Iron and Hgb now 13 to 15.5.  12/2009 Colonoscopy: for abdominal pain and change in bowel habits.  4 polyps removed (ascending, transverse, rectum; tubular adenoma and mucal prolapse polyp on path).  Ascending AVM.  Moderate sigmoid tics.  12/2009 EGD: Dysphagia, abdominal pain.  Normal.    Seen initially by Dr Henrene Pastor regarding n/v/d on 5/1.  Nausea described as chronic, with vomiting of digested foot intermittent. Belly pain generalized, no aggravating or relieving factors. 6 to 7 loose stools/24 hours.  Differential was diabetic gastroparesis, polypharmacy, refractory GERD, comorbidities including renal disease.  He added scheduled Zofran, upped Nexium to BID, ordered stool pathogen panel (Unsuccessful, due to some sort of lab issue), ordered GES (normal on 01/02/15)   Taking Tylenol prn for pain, vicodin on med list.   CT abdomen pelvis without contrast 12/16/14:  Nothing acute, stable 1.3 cm low attenuation lesion right liver lobe.  Subtle liver nodularity on left, suggestive of cirrhosis.    CT abdomen pelvis without contrast.  12/21/14: nothing acute. Scattered colon tics, scattered calcifications of coronaries, abdominal aorta and branches, stable low attenuation liver lesion.  Chronic, anterior, soft tissue inflammation of abdominal wall.  CT  abdomen pelvis without contrast 01/20/15: Atherosclerosis, including left anterior descending coronary artery disease. Status post splenectomy. Multiple splenules. Moderate atherosclerosis throughout the abdominal and pelvic vasculature, without definite aneurysm. Moderate atherosclerosis throughout the abdominal and pelvic vasculature, without definite aneurysm.  Status post cholecystectomy.  Stable right liver lesion   Admitted yesterday with ongoing/v/d and dehydration with lactic acidosis.  Had fallen at home. Told admitting MD that she ceased taking all meds 2 months ago. Weight dropped 16 # in 20 days.  Glucose just 128. BUN/creat within historic range.  Lactic acid 3, 1.2 today.  Today's labs show AST/ALT 59/11, was 38/11 yesterday. Previously normal.  Alk phos is elevated at 137 on 5/31, 304 on 6/20, 257 today.  Has been normal on 5/21.  T bili is normal.  Albumin progressively declined to 1.8 today.         Past Medical History  Diagnosis Date  . Essential hypertension, benign   . Coronary atherosclerosis of native coronary artery 01/04/2006    Tiny OM1 70-90% ostial stenosis, no other CAD  . COPD (chronic obstructive pulmonary disease)   . Esophageal reflux   . Dyslipidemia   . Gout   . Colon polyps 2011    Tubular adenomatous polyps  . Spinal stenosis of lumbar region     chronic low back pain.   . Bulging lumbar  disc   . Chronic diastolic heart failure   . PNA (pneumonia) 2012    With pleural effusion, requiring thoracentesis  . Chronic bronchitis   . On home oxygen therapy   . OSA (obstructive sleep apnea)     Marginally compliant with CPAP  . Type II diabetes mellitus   . Arthritis   . Diverticulosis   . GERD (gastroesophageal reflux disease)   . Anxiety   . Hyperlipidemia   . Sarcoidosis of lung   . CKD (chronic kidney disease) stage 4, GFR 15-29 ml/min   . IBS (irritable bowel syndrome)   . Cataracts, both eyes   . Headache(784.0)   . Lactic acidosis  01/21/2015    Past Surgical History  Procedure Laterality Date  . Vesicovaginal fistula closure w/  total abdominal hysterectomy  1992  . Bilateral total knee replacements Bilateral Rt=5/04 & Lft=1/09    by DrAlusio  . 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 Zella Richer  . Appendectomy  1957  . Tonsillectomy  1968  . Cholecystectomy  1980's  . Abdominal hysterectomy  1992  . Reduction mammaplasty Bilateral 1980's  . Cardiac catheterization  1990's  . Thoracentesis  2012  . Joint replacement    . Av fistula placement Left 12/31/2013    Procedure: ARTERIOVENOUS (AV) FISTULA CREATION;  Surgeon: Elam Dutch, MD;  Location: [place];  Service: Vascular;  Laterality: Left;  . Colonoscopy w/ biopsies and polypectomy    . Av fistula placement Left 03/18/2014    Procedure: ARTERIOVENOUS (AV) FISTULA CREATION- LEFT BRACHIOCEPHALIC ;  Surgeon: Elam Dutch, MD;  Location: [facility] OR;  Service: Vascular;  Laterality: Left;  . Ligation of arteriovenous  fistula Left 03/18/2014    Procedure: LIGATION OF ARTERIOVENOUS  FISTULA- LEFT RADIOCEPHALIC;  Surgeon: Elam Dutch, MD;  Location: [place];  Service: Vascular;  Laterality: Left;    Prior to Admission medications   Medication Sig Start Date End Date Taking? Authorizing Provider  clotrimazole-betamethasone (LOTRISONE) cream Apply 1 application topically 2 (two) times daily as needed (eczema).  01/18/14  Yes Historical Provider, MD  esomeprazole (NEXIUM) 40 MG capsule Take 1 capsule (40 mg total) by mouth 2 (two) times daily. 01/01/15  Yes Irene Shipper, MD  ferrous sulfate 325 (65 FE) MG tablet Take 325 mg by mouth daily with breakfast.   Yes Historical Provider, MD  fluocinonide cream (LIDEX) 2.37 % Apply 1 application topically 2 (two) times daily.   Yes Historical Provider, MD  fluticasone (FLONASE) 50 MCG/ACT nasal spray Place 2 sprays into both nostrils daily as needed for allergies or rhinitis.    Yes Historical Provider, MD  Fluticasone-Salmeterol  (ADVAIR) 250-50 MCG/DOSE AEPB Inhale 1 puff into the lungs 2 (two) times daily. 09/25/14  Yes Noralee Space, MD  furosemide (LASIX) 80 MG tablet Take 2 tablets (160 mg total) by mouth 2 (two) times daily. 09/09/14  Yes Albertine Patricia, MD  hydrALAZINE (APRESOLINE) 50 MG tablet Take 1 tablet (50 mg total) by mouth 3 (three) times daily. 09/09/14  Yes Albertine Patricia, MD  HYDROcodone-acetaminophen (NORCO/VICODIN) 5-325 MG per tablet Take 1 tablet by mouth every 6 (six) hours as needed for moderate pain. 12/21/14  Yes Christopher Lawyer, PA-C  Insulin Glargine 300 UNIT/ML SOPN Inject 70 Units into the skin daily.   Yes Historical Provider, MD  insulin lispro (HUMALOG KWIKPEN) 100 UNIT/ML KiwkPen Inject 0.1 mLs (10 Units total) into the skin 3 (three) times daily. Take 10 units  (  plus additional 4-12 units based on sliding scale) with breakfast, 10 units (plus additional 4-12 units based on sliding scale) with and 10 units (+ plus additional 4-12 units based on sliding scale) with dinner.Inject 35-40 Units into the skin 3 (three) times daily. 09/09/14  Yes Albertine Patricia, MD  metolazone (ZAROXOLYN) 5 MG tablet Take 5 mg by mouth daily as needed (high blood pressure).    Yes Historical Provider, MD  metoprolol succinate (TOPROL-XL) 25 MG 24 hr tablet Take 12.5 mg by mouth 2 (two) times daily.   Yes Historical Provider, MD  Multiple Vitamins-Minerals (MULTIVITAMIN WITH MINERALS) tablet Take 1 tablet by mouth daily.   Yes Historical Provider, MD  ondansetron (ZOFRAN) 4 MG tablet Take 1 tablet (4 mg total) by mouth every 8 (eight) hours as needed for nausea or vomiting. 12/31/14  Yes Robynn Pane, MD  pregabalin (LYRICA) 100 MG capsule Take 100 mg by mouth at bedtime.    Yes Historical Provider, MD  promethazine (PHENERGAN) 25 MG tablet Take 1 tablet (25 mg total) by mouth every 4 (four) hours. 01/01/15  Yes Irene Shipper, MD  triamcinolone cream (KENALOG) 0.1 % Apply 1 application topically 2 (two) times daily.    Yes Historical Provider, MD  acetaminophen (TYLENOL) 500 MG tablet Take 1,000 mg by mouth 2 (two) times daily as needed (pain).     Historical Provider, MD  albuterol (PROVENTIL HFA;VENTOLIN HFA) 108 (90 BASE) MCG/ACT inhaler Inhale 2 puffs into the lungs every 4 (four) hours as needed for wheezing or shortness of breath.     Historical Provider, MD  albuterol (PROVENTIL) (2.5 MG/3ML) 0.083% nebulizer solution Take 2.5 mg by nebulization every 2 (two) hours as needed for wheezing or shortness of breath.    Historical Provider, MD  Alpha-D-Galactosidase (BEANO PO) Take 1 tablet by mouth daily.    Historical Provider, MD  Apremilast (OTEZLA) 30 MG TABS Take 30 mg by mouth daily.     Historical Provider, MD  aspirin EC 81 MG tablet Take 81 mg by mouth daily.    Historical Provider, MD  atorvastatin (LIPITOR) 20 MG tablet Take 20 mg by mouth at bedtime.    Historical Provider, MD    Scheduled Meds: . antiseptic oral rinse  7 mL Mouth Rinse BID  . aspirin EC  81 mg Oral Daily  . atorvastatin  20 mg Oral QHS  . famotidine (PEPCID) IV  20 mg Intravenous Q24H  . ferrous sulfate  325 mg Oral Q breakfast  . heparin  5,000 Units Subcutaneous 3 times per day  . hydrALAZINE  50 mg Oral TID  . insulin aspart  0-5 Units Subcutaneous QHS  . insulin aspart  0-9 Units Subcutaneous TID WC  . metronidazole  500 mg Intravenous Q8H  . mometasone-formoterol  2 puff Inhalation BID  . piperacillin-tazobactam (ZOSYN)  IV  3.375 g Intravenous 3 times per day  . sodium chloride  3 mL Intravenous Q12H  . vancomycin  1,000 mg Intravenous Q24H   Infusions: . sodium chloride     PRN Meds: acetaminophen **OR** acetaminophen, albuterol, fluticasone, gi cocktail, HYDROcodone-acetaminophen, ondansetron **OR** ondansetron (ZOFRAN) IV   Allergies as of 01/20/2015 - Review Complete 01/20/2015  Allergen Reaction Noted  . Adhesive [tape] Other (See Comments)   . Avelox [moxifloxacin hcl in nacl] Other (See Comments)  04/06/2011  . Codeine Other (See Comments) 09/24/2010  . Guaifenesin Nausea And Vomiting   . Latex Swelling   . Oxycodone Nausea And Vomiting  Family History  Problem Relation Age of Onset  . Diabetes Mother   . Heart disease Mother   . Hyperlipidemia Mother   . Varicose Veins Mother   . Breast cancer Maternal Aunt   . Heart disease Father   . Deep vein thrombosis Father   . Hyperlipidemia Father   . Heart disease Sister     before age 35  . Cancer Sister   . Diabetes Sister   . Hyperlipidemia Sister   . Heart attack Sister   . Heart disease Brother   . Diabetes Brother   . Hyperlipidemia Brother   . Heart attack Brother   . Hyperlipidemia Sister     History   Social History  . Marital Status: Single    Spouse Name: N/A  . Number of Children: 0  . Years of Education: N/A   Occupational History  . Retired   . SECRETARY    Social History Main Topics  . Smoking status: Never Smoker   . Smokeless tobacco: Never Used  . Alcohol Use: No  . Drug Use: No  . Sexual Activity: Not Currently   Other Topics Concern  . Not on file   Social History Narrative   Lives in [place] alone.  Never married.   Retired Art therapist at [employer]: Constitutional:  weakness ENT:  No nose bleeds Pulm:  No cough.  Stable DOE CV:  No palpitations, no LE edema.  GU:  No hematuria, no frequency GI:  Per HPI Heme:  Per HPI   Transfusions:  none Neuro:  No headaches, no peripheral tingling or numbness Derm:  No itching, no rash or sores.  Endocrine:  No sweats or chills.  No polyuria or dysuria Immunization:  Reviewed, flu and pneumovax up to date.  Travel:  None beyond local counties in last few months.    PHYSICAL EXAM: Vital signs in last 24 hours: Filed Vitals:   01/21/15 1213  BP: 155/70  Pulse: 81  Temp: 98.4 F (36.9 C)  Resp: 16   Wt Readings from Last 3 Encounters:  01/21/15 192 lb 11.2 oz (87.408 kg)  01/02/15 199 lb (90.266  kg)  01/01/15 208 lb (94.348 kg)    General: somnolent, nearly obtunded obese AAF who looks chronically ill.  Head:  No asymmetry.  No signs of trauma  Eyes:  No icterus or conj pallor.  Some bulging of eyes Ears:  No obvious hearing deficit  Nose:  No congestion or discharge Mouth:  Moist, clear.  Fair dentition.  No exudate or lesion Neck:  No mass, no TMG Lungs:  Clear bil but overall reduced BS.  No cough or labored breathing Heart: RRR.  No MRG Abdomen:  Obese, no HSM.  No bruits, hernias.  BS active.  Minimal if any tenderness in mid to lower abdomen, nothing focal. .   Rectal: deferred   Musc/Skeltl: no joint erythema, contracture or swelling Extremities:  No CCE.  AV fistula on left UE is without thrill  Neurologic:  Oriented to self, to place but not to year "1916".  Aware of why she is here.  No tremor, no asterixis.  Not reliably following commands.  Reverts to "sleep" easily, somewhat difficult to arouse and unable to maintain arousal for longer than 2 or 3 seconds.  Moves all 4 limbs.  Did not test limb strength Skin:  No sores or rash.  Tattoos:  none Nodes:  No cervical adenopathy.    Psych:  Cooperative.  Affect very flat/depressed.   Intake/Output from previous day: 06/20 0701 - 06/21 0700 In: 1590 [P.O.:240; I.V.:1350] Out: 350 [Urine:350] Intake/Output this shift: Total I/O In: 980 [P.O.:730; IV Piggyback:250] Out: -   LAB RESULTS:  Recent Labs  01/20/15 1459 01/21/15 0650  WBC 7.4 6.7  HGB 15.8* 14.0  HCT 48.1* 42.7  PLT 375 353   BMET Lab Results  Component Value Date   NA 137 01/21/2015   NA 140 01/20/2015   NA 141 12/31/2014   K 3.7 01/21/2015   K 3.1* 01/20/2015   K 2.9* 12/31/2014   CL 111 01/21/2015   CL 108 01/20/2015   CL 105 12/31/2014   CO2 18* 01/21/2015   CO2 22 01/20/2015   CO2 25 12/31/2014   GLUCOSE 95 01/21/2015   GLUCOSE 128* 01/20/2015   GLUCOSE 54* 12/31/2014   BUN 15 01/21/2015   BUN 16 01/20/2015   BUN 5*  12/31/2014   CREATININE 2.05* 01/21/2015   CREATININE 2.45* 01/20/2015   CREATININE 2.27* 12/31/2014   CALCIUM 7.6* 01/21/2015   CALCIUM 8.3* 01/20/2015   CALCIUM 7.8* 12/31/2014   LFT  Recent Labs  01/20/15 1459 01/21/15 0020  PROT 5.6* 4.5*  ALBUMIN 2.1* 1.8*  AST 38 59*  ALT 11* 11*  ALKPHOS 304* 257*  BILITOT 0.6 0.7   Lipase     Component Value Date/Time   LIPASE 14* 01/20/2015 1459    RADIOLOGY STUDIES: Ct Abdomen Pelvis Wo Contrast  01/20/2015   CLINICAL DATA:  69 year old female with 3-4 week history of nausea, vomiting, diarrhea, weakness and abdominal pain while eating. History of trauma from a fall today.  EXAM: CT ABDOMEN AND PELVIS WITHOUT CONTRAST  TECHNIQUE: Multidetector CT imaging of the abdomen and pelvis was performed following the standard protocol without IV contrast.  COMPARISON:  CT of the abdomen and pelvis 12/21/2014.  FINDINGS: Lower chest: Mild cylindrical bronchiectasis, thickening of the peribronchovascular interstitium and architectural distortion in the lower lobes of the lungs bilaterally, which could be post infectious, or could be related to recurrent aspiration. Mild cardiomegaly. Atherosclerotic calcifications in the left main and left anterior descending coronary artery.  Hepatobiliary: Status post cholecystectomy. Irregular area of low attenuation and calcification in the posterior aspect of the right lobe of the liver is similar to remote prior studies dating back to at least 08/25/2013, favored to be related to sequela of remote trauma. No new hepatic lesions.  Pancreas: No definite pancreatic mass or surrounding inflammatory changes on today's noncontrast CT examination.  Spleen: Status post splenectomy. Multiple small splenules noted in the left side of the abdomen.  Adrenals/Urinary Tract: The unenhanced appearance of the adrenal glands bilaterally and the right kidney is normal. Mild scarring along the lateral aspect of the upper pole of the  left kidney, likely related to prior splenectomy. Left kidney is otherwise normal in appearance. No hydroureteronephrosis. Urinary bladder is normal in appearance.  Stomach/Bowel: The unenhanced appearance of the stomach is normal. No pathologic dilatation of small bowel or colon. A few scattered colonic diverticulae are noted, without surrounding inflammatory changes to suggest an acute diverticulitis at this time.  Vascular/Lymphatic: Moderate atherosclerosis throughout the abdominal and pelvic vasculature, without definite aneurysm. No lymphadenopathy noted in the abdomen or pelvis on today's noncontrast CT examination.  Reproductive: Status post hysterectomy.  Ovaries are atrophic.  Other: No significant volume of ascites.  No pneumoperitoneum.  Musculoskeletal: There are no aggressive appearing lytic or blastic lesions noted in the visualized portions of  the skeleton.  IMPRESSION: 1. No acute findings in the abdomen or pelvis on today's noncontrast CT examination to account for the patient's symptoms. 2. The appearance of the lower lobes of the lungs could suggest post infectious scarring, but could also be seen in the setting of recurrent aspiration. Clinical correlation is suggested. 3. Mild cardiomegaly. 4. Atherosclerosis, including left anterior descending coronary artery disease. Please note that although the presence of coronary artery calcium documents the presence of coronary artery disease, the severity of this disease and any potential stenosis cannot be assessed on this non-gated CT examination. Assessment for potential risk factor modification, dietary therapy or pharmacologic therapy may be warranted, if clinically indicated. 5. Status post splenectomy. Multiple splenules are noted throughout the left side of the abdomen. 6. Status post cholecystectomy. 7. Additional incidental findings, as above.   Electronically Signed   By: Vinnie Langton M.D.   On: 01/20/2015 19:38     IMPRESSION:   *   Chronic n/v/d.  Chronic non-focal abdominal pain.   Unrevealing non-contrast CT scans x 3 in the last 4 weeks. GES study normal 3 weeks ago. Alk phos and AST elevated, GB is out. Suggestion of cirrhosis on CT scan 12/16/14. Coags normal.  Stool pathogen panel pending Started on sepsis protocol with Vanc/zosyn.   *  CKD stage 4 .  AVF placed but non-functioning.   *  IDDM  *   CHF.  2D echo today with EF 30 to 35% (down from 50 to 60% 09/05/14) .  LV thrombus, mild, MV regurge, moderate increase of PA pressure, trivial pericardial effusion. No recent BNP. Borderline vascular congestion on CXR.  Will likely need AC.  Cardiology consult in progress  *  AMS.  No asterixis on exam. Got haldol doses at 1746 yesterday but no other meds given that would lead to AMS.   *  Medication non-compliance according to admission H & P.    PLAN:     *  Await stool path panel.  *  Ammonia level in AM.   *  Dr Carlean Purl to follow.     [name]  01/21/2015, 2:58 PM Pager: [phone]     Agree with the above overall though doubt stool panel will tell us anything since 6/2 test was also negative. I am thinking cardiac issues may be causing GI sxs but not sure. Sjhe has new systolic heart failure per echo and  Lab Results  Component Value Date   CKTOTAL 223 01/20/2015   CKMB 3.2 08/12/2011   TROPONINI 5.64* 03/14/8870    Certainly needs attention to cardiovascular problems as she is getting. She has sxs and signs of GI problems but no problems on imahging or labs that telluus anything.  Also has signs of severe protein calorie malnutrition. She could need TPN if vomiting does not resolve and allow feeding.  Will reassess tomorrow.   Gatha Mayer, MD, [facility] [phone] (pager) 01/21/2015 5:56 PM

## 2015-01-21 NOTE — Progress Notes (Signed)
Echocardiogram 2D Echocardiogram with Defiinity has been performed.  [name] 01/21/2015, 11:07 AM

## 2015-01-21 NOTE — Telephone Encounter (Signed)
I think that it is unlikely that she has an ulcer on twice a day Nexium. If she did have an ulcer, Nexium is the treatment, so continue. Most of her abdominal pain is muscle wall in nature. She has had multiple negative CTs recently. Continue medication and time. No new recommendations currently

## 2015-01-21 NOTE — Progress Notes (Signed)
CRITICAL VALUE ALERT  Critical value received:  Troponin 5.64  Date of notification:  01/21/2015  Time of notification:  4:27 PM  Critical value read back:Yes.    Nurse who received alert:  Girtha Rm  MD notified (1st page):  Dr. Dyann Kief   Time of first page: 4:27 PM  MD notified (2nd page):  Time of second page:  Responding MD:  Dr. Dyann Kief  Time MD responded:  4:30 PM

## 2015-01-21 NOTE — Progress Notes (Signed)
Utilization Review completed. Kahil Agner RN BSN CM 

## 2015-01-21 NOTE — Telephone Encounter (Signed)
Left a message for patient to call back. 

## 2015-01-21 NOTE — Consult Note (Signed)
Patient ID: [name] MRN: [id number], DOB/AGE: [birth year]   Admit date: 01/20/2015   Primary Physician: Antony Blackbird, MD Primary Cardiologist: Dr. Radford Pax  Pt. Profile:  69 y/o female with h/o chronic diastolic CHF, HTN, HLD, DM, COPD, and CKD, admitted for abdominal pain and weakness. Also found to have new reduction in LV systolic function and a LV thrombus.   Problem List  Past Medical History  Diagnosis Date  . Essential hypertension, benign   . Coronary atherosclerosis of native coronary artery 01/04/2006    Tiny OM1 70-90% ostial stenosis, no other CAD  . COPD (chronic obstructive pulmonary disease)   . Esophageal reflux   . Dyslipidemia   . Gout   . Colon polyps 2011    Tubular adenomatous polyps  . Spinal stenosis of lumbar region     chronic low back pain.   . Bulging lumbar disc   . Chronic diastolic heart failure   . PNA (pneumonia) 2012    With pleural effusion, requiring thoracentesis  . Chronic bronchitis   . On home oxygen therapy   . OSA (obstructive sleep apnea)     Marginally compliant with CPAP  . Type II diabetes mellitus   . Arthritis   . Diverticulosis   . GERD (gastroesophageal reflux disease)   . Anxiety   . Hyperlipidemia   . Sarcoidosis of lung   . CKD (chronic kidney disease) stage 4, GFR 15-29 ml/min   . IBS (irritable bowel syndrome)   . Cataracts, both eyes   . Headache(784.0)   . Lactic acidosis 01/21/2015    Past Surgical History  Procedure Laterality Date  . Vesicovaginal fistula closure w/  total abdominal hysterectomy  1992  . Bilateral total knee replacements Bilateral Rt=5/04 & Lft=1/09    by DrAlusio  . 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 Zella Richer  . Appendectomy  1957  . Tonsillectomy  1968  . Cholecystectomy  1980's  . Abdominal hysterectomy  1992  . Reduction mammaplasty Bilateral 1980's  . Cardiac catheterization  1990's  . Thoracentesis  2012  . Joint replacement    . Av fistula placement Left  12/31/2013    Procedure: ARTERIOVENOUS (AV) FISTULA CREATION;  Surgeon: Elam Dutch, MD;  Location: [place];  Service: Vascular;  Laterality: Left;  . Colonoscopy w/ biopsies and polypectomy    . Av fistula placement Left 03/18/2014    Procedure: ARTERIOVENOUS (AV) FISTULA CREATION- LEFT BRACHIOCEPHALIC ;  Surgeon: Elam Dutch, MD;  Location: [facility] OR;  Service: Vascular;  Laterality: Left;  . Ligation of arteriovenous  fistula Left 03/18/2014    Procedure: LIGATION OF ARTERIOVENOUS  FISTULA- LEFT RADIOCEPHALIC;  Surgeon: Elam Dutch, MD;  Location: [facility];  Service: Vascular;  Laterality: Left;     Allergies  Allergies  Allergen Reactions  . Adhesive [Tape] Other (See Comments)    Blisters   . Avelox [Moxifloxacin Hcl In Nacl] Other (See Comments)    GI upset  . Codeine Other (See Comments)    "crazy"   . Guaifenesin Nausea And Vomiting    Takes Mucinex at home without issue  . Latex Swelling  . Oxycodone Nausea And Vomiting    Takes Percocet at home without issue    HPI  The patient is a 69 y/o female, followed by Dr. Radford Pax,  with a h/o chronic diastolic CHF, COPD on chronic O2 at 3L, morbid obesity, sarcoidosis, OSA on CPAP, CKD stage IV and HTN.  In  June 2007, she underwent a LHC by Dr. Lia Foyer which revealed a tiny first marginal branch with 70-90% ostial stenosis, but the vessel was noted to be extremely small and insignificant. She had no critical obstruction of any of the major coronary vessels at that time. LVF was normal. She had a stress test in April 2014 that showed no ischemia. She also had a 2D echo  in 2014 which demonstrated normal LV systolic function with EF at 50-55%. Mild MR was also noted.  She presented to [facility] on 01/20/15 with a complaint of a nearly 6 week history of  difuse abdominal pain, nausea and vomiting. She also noted generalized fatigue, weakness and lethargy leading to a fall. She states that she has had difficulty tolerating/ keeping down her  prescribed meds due to nausea/vomitting. She denies chest pain or dyspnea. No fever or chills. No known contacts with similar symptoms. Over the last several weeks, she has had multiple presentations to the ED with similar complaints and imaging studies have been unremarkable. Repeat CT of the abdomin this admission showed no acute abnormalities. During w/u, she was noted to have an elevated procalcitonin level, suggesting high likelihood of a bacterial infection. She is currently being treated with vancomycin, Zosyn and Flagyl to cover her for possibility of C. Difficile. Continued GI w/u pending.  Additional w/u included a 2D echo, performed 6/20, which demonstrated significant reduction in LV systolic function with EF now at 30-35%. Acoustic contrast opacification also revealed a medium-sized, 2.2 cm (L) x 1.2 cm (W), LV thrombus with an associated  hypokinetic segment. Right ventricular systolic function was also moderately reduced with moderately increased pulmonary artery pressure at 62 mm Hg. Cardiac enzymes are pending. Her most recent EKG was 12/17/14 which showed RBBB and LPFB. As mentioned above, she denies any recent h/o CP and no increased dyspnea beyond her baseline.     Home Medications  Prior to Admission medications   Medication Sig Start Date End Date Taking? Authorizing Provider  clotrimazole-betamethasone (LOTRISONE) cream Apply 1 application topically 2 (two) times daily as needed (eczema).  01/18/14  Yes Historical Provider, MD  esomeprazole (NEXIUM) 40 MG capsule Take 1 capsule (40 mg total) by mouth 2 (two) times daily. 01/01/15  Yes Irene Shipper, MD  ferrous sulfate 325 (65 FE) MG tablet Take 325 mg by mouth daily with breakfast.   Yes Historical Provider, MD  fluocinonide cream (LIDEX) 4.96 % Apply 1 application topically 2 (two) times daily.   Yes Historical Provider, MD  fluticasone (FLONASE) 50 MCG/ACT nasal spray Place 2 sprays into both nostrils daily as needed for allergies  or rhinitis.    Yes Historical Provider, MD  Fluticasone-Salmeterol (ADVAIR) 250-50 MCG/DOSE AEPB Inhale 1 puff into the lungs 2 (two) times daily. 09/25/14  Yes Noralee Space, MD  furosemide (LASIX) 80 MG tablet Take 2 tablets (160 mg total) by mouth 2 (two) times daily. 09/09/14  Yes Albertine Patricia, MD  hydrALAZINE (APRESOLINE) 50 MG tablet Take 1 tablet (50 mg total) by mouth 3 (three) times daily. 09/09/14  Yes Albertine Patricia, MD  HYDROcodone-acetaminophen (NORCO/VICODIN) 5-325 MG per tablet Take 1 tablet by mouth every 6 (six) hours as needed for moderate pain. 12/21/14  Yes Christopher Lawyer, PA-C  Insulin Glargine 300 UNIT/ML SOPN Inject 70 Units into the skin daily.   Yes Historical Provider, MD  insulin lispro (HUMALOG KWIKPEN) 100 UNIT/ML KiwkPen Inject 0.1 mLs (10 Units total) into the skin 3 (three) times daily. Take  10 units  (plus additional 4-12 units based on sliding scale) with breakfast, 10 units (plus additional 4-12 units based on sliding scale) with and 10 units (+ plus additional 4-12 units based on sliding scale) with dinner.Inject 35-40 Units into the skin 3 (three) times daily. 09/09/14  Yes Albertine Patricia, MD  metolazone (ZAROXOLYN) 5 MG tablet Take 5 mg by mouth daily as needed (high blood pressure).    Yes Historical Provider, MD  metoprolol succinate (TOPROL-XL) 25 MG 24 hr tablet Take 12.5 mg by mouth 2 (two) times daily.   Yes Historical Provider, MD  Multiple Vitamins-Minerals (MULTIVITAMIN WITH MINERALS) tablet Take 1 tablet by mouth daily.   Yes Historical Provider, MD  ondansetron (ZOFRAN) 4 MG tablet Take 1 tablet (4 mg total) by mouth every 8 (eight) hours as needed for nausea or vomiting. 12/31/14  Yes Robynn Pane, MD  pregabalin (LYRICA) 100 MG capsule Take 100 mg by mouth at bedtime.    Yes Historical Provider, MD  promethazine (PHENERGAN) 25 MG tablet Take 1 tablet (25 mg total) by mouth every 4 (four) hours. 01/01/15  Yes Irene Shipper, MD  triamcinolone cream  (KENALOG) 0.1 % Apply 1 application topically 2 (two) times daily.   Yes Historical Provider, MD  acetaminophen (TYLENOL) 500 MG tablet Take 1,000 mg by mouth 2 (two) times daily as needed (pain).     Historical Provider, MD  albuterol (PROVENTIL HFA;VENTOLIN HFA) 108 (90 BASE) MCG/ACT inhaler Inhale 2 puffs into the lungs every 4 (four) hours as needed for wheezing or shortness of breath.     Historical Provider, MD  albuterol (PROVENTIL) (2.5 MG/3ML) 0.083% nebulizer solution Take 2.5 mg by nebulization every 2 (two) hours as needed for wheezing or shortness of breath.    Historical Provider, MD  Alpha-D-Galactosidase (BEANO PO) Take 1 tablet by mouth daily.    Historical Provider, MD  Apremilast (OTEZLA) 30 MG TABS Take 30 mg by mouth daily.     Historical Provider, MD  aspirin EC 81 MG tablet Take 81 mg by mouth daily.    Historical Provider, MD  atorvastatin (LIPITOR) 20 MG tablet Take 20 mg by mouth at bedtime.    Historical Provider, MD    Family History  Family History  Problem Relation Age of Onset  . Diabetes Mother   . Heart disease Mother   . Hyperlipidemia Mother   . Varicose Veins Mother   . Breast cancer Maternal Aunt   . Heart disease Father   . Deep vein thrombosis Father   . Hyperlipidemia Father   . Heart disease Sister     before age 33  . Cancer Sister   . Diabetes Sister   . Hyperlipidemia Sister   . Heart attack Sister   . Heart disease Brother   . Diabetes Brother   . Hyperlipidemia Brother   . Heart attack Brother   . Hyperlipidemia Sister     Social History  History   Social History  . Marital Status: Single    Spouse Name: N/A  . Number of Children: 0  . Years of Education: N/A   Occupational History  . Retired   . SECRETARY    Social History Main Topics  . Smoking status: Never Smoker   . Smokeless tobacco: Never Used  . Alcohol Use: No  . Drug Use: No  . Sexual Activity: Not Currently   Other Topics Concern  . Not on file    Social History Narrative  Lives in [place] alone.  Never married.   Retired Art therapist at [employer]:  No chills, fever, night sweats or weight changes.  Cardiovascular:  No chest pain, dyspnea on exertion, edema, orthopnea, palpitations, paroxysmal nocturnal dyspnea. Dermatological: No rash, lesions/masses Respiratory: No cough, dyspnea Urologic: No hematuria, dysuria Abdominal:   No nausea, vomiting, diarrhea, bright red blood per rectum, melena, or hematemesis Neurologic:  No visual changes, wkns, changes in mental status. All other systems reviewed and are otherwise negative except as noted above.  Physical Exam  Blood pressure 155/70, pulse 81, temperature 98.4 F (36.9 C), temperature source Oral, resp. rate 16, height 5' 2.4" (1.585 m), weight 192 lb 11.2 oz (87.408 kg), SpO2 100 %.  General: NAD, lethargic but responds to questions  Psych: lethargic. Neuro: lethargic but responds to demands. HEENT: Normal  Neck: Supple without bruits or JVD. Lungs:  Resp regular and unlabored, CTA. Heart: RRR no s3, s4, or murmurs. Abdomen: Soft, non-tender, non-distended, BS + x 4.  Extremities: No clubbing, cyanosis or edema. DP/PT/Radials 2+ and equal bilaterally.  Labs  Troponin (Point of Care Test) No results for input(s): TROPIPOC in the last 72 hours.  Recent Labs  01/20/15 1702  CKTOTAL 223   Lab Results  Component Value Date   WBC 6.7 01/21/2015   HGB 14.0 01/21/2015   HCT 42.7 01/21/2015   MCV 87.5 01/21/2015   PLT 353 01/21/2015     Recent Labs Lab 01/21/15 0020  NA 137  K 3.7  CL 111  CO2 18*  BUN 15  CREATININE 2.05*  CALCIUM 7.6*  PROT 4.5*  BILITOT 0.7  ALKPHOS 257*  ALT 11*  AST 59*  GLUCOSE 95   Lab Results  Component Value Date   CHOL 116 06/24/2014   HDL 36.70* 06/24/2014   LDLCALC 56 06/24/2014   TRIG 116.0 06/24/2014   No results found for: DDIMER   Radiology/Studies  Ct Abdomen Pelvis  Wo Contrast  01/20/2015   CLINICAL DATA:  69 year old female with 3-4 week history of nausea, vomiting, diarrhea, weakness and abdominal pain while eating. History of trauma from a fall today.  EXAM: CT ABDOMEN AND PELVIS WITHOUT CONTRAST  TECHNIQUE: Multidetector CT imaging of the abdomen and pelvis was performed following the standard protocol without IV contrast.  COMPARISON:  CT of the abdomen and pelvis 12/21/2014.  FINDINGS: Lower chest: Mild cylindrical bronchiectasis, thickening of the peribronchovascular interstitium and architectural distortion in the lower lobes of the lungs bilaterally, which could be post infectious, or could be related to recurrent aspiration. Mild cardiomegaly. Atherosclerotic calcifications in the left main and left anterior descending coronary artery.  Hepatobiliary: Status post cholecystectomy. Irregular area of low attenuation and calcification in the posterior aspect of the right lobe of the liver is similar to remote prior studies dating back to at least 08/25/2013, favored to be related to sequela of remote trauma. No new hepatic lesions.  Pancreas: No definite pancreatic mass or surrounding inflammatory changes on today's noncontrast CT examination.  Spleen: Status post splenectomy. Multiple small splenules noted in the left side of the abdomen.  Adrenals/Urinary Tract: The unenhanced appearance of the adrenal glands bilaterally and the right kidney is normal. Mild scarring along the lateral aspect of the upper pole of the left kidney, likely related to prior splenectomy. Left kidney is otherwise normal in appearance. No hydroureteronephrosis. Urinary bladder is normal in appearance.  Stomach/Bowel: The unenhanced appearance of the stomach is normal. No  pathologic dilatation of small bowel or colon. A few scattered colonic diverticulae are noted, without surrounding inflammatory changes to suggest an acute diverticulitis at this time.  Vascular/Lymphatic: Moderate  atherosclerosis throughout the abdominal and pelvic vasculature, without definite aneurysm. No lymphadenopathy noted in the abdomen or pelvis on today's noncontrast CT examination.  Reproductive: Status post hysterectomy.  Ovaries are atrophic.  Other: No significant volume of ascites.  No pneumoperitoneum.  Musculoskeletal: There are no aggressive appearing lytic or blastic lesions noted in the visualized portions of the skeleton.  IMPRESSION: 1. No acute findings in the abdomen or pelvis on today's noncontrast CT examination to account for the patient's symptoms. 2. The appearance of the lower lobes of the lungs could suggest post infectious scarring, but could also be seen in the setting of recurrent aspiration. Clinical correlation is suggested. 3. Mild cardiomegaly. 4. Atherosclerosis, including left anterior descending coronary artery disease. Please note that although the presence of coronary artery calcium documents the presence of coronary artery disease, the severity of this disease and any potential stenosis cannot be assessed on this non-gated CT examination. Assessment for potential risk factor modification, dietary therapy or pharmacologic therapy may be warranted, if clinically indicated. 5. Status post splenectomy. Multiple splenules are noted throughout the left side of the abdomen. 6. Status post cholecystectomy. 7. Additional incidental findings, as above.   Electronically Signed   By: Vinnie Langton M.D.   On: 01/20/2015 19:38   Nm Gastric Emptying  01/02/2015   CLINICAL DATA:  Severe abdominal pain with nausea and vomiting. Abdominal bloating.  EXAM: NUCLEAR MEDICINE GASTRIC EMPTYING SCAN  TECHNIQUE: After oral ingestion of radiolabeled meal, sequential abdominal images were obtained for 4 hours. Percentage of activity emptying the stomach was calculated at 1 hour, 2 hour, 3 hour, and 4 hours.  RADIOPHARMACEUTICALS:  2.0 mCi Technetium-60m labeled sulfur colloid orally cooked with one egg  with 8 oz of ginger ale.  COMPARISON:  None.  FINDINGS: Expected location of the stomach in the left upper quadrant. Ingested meal empties the stomach gradually over the course of the study.  40.3% emptied at 1 hr ( normal >= 10%)  76.85% emptied at 2 hr ( normal >= 40%)  93.18% emptied at 3 hr ( normal >= 70%)  98.25% emptied at 4 hr ( normal >= 90%)  IMPRESSION: Normal gastric emptying study.   Electronically Signed   By: Lorriane Shire M.D.   On: 01/02/2015 15:04   Dg Abd Acute W/chest  12/31/2014   CLINICAL DATA:  Nausea, vomiting and diarrhea for 1 week. Diverticulitis diagnosed approximately 2 weeks ago  EXAM: DG ABDOMEN ACUTE W/ 1V CHEST  COMPARISON:  Radiographs and CT 12/21/2014.  FINDINGS: Borderline cardiomegaly and vascular congestion are again noted, similar to the prior study. There is no confluent airspace opacity or significant pleural effusion.  The bowel gas pattern appears normal. There is no free intraperitoneal air or suspicious abdominal calcification. Scattered vascular calcifications are noted. There are surgical clips in the upper abdomen bilaterally. Mild lumbar spine degenerative changes are stable.  IMPRESSION: 1. Stable radiographs of the chest and abdomen. No acute abdominal findings identified. 2. Stable borderline cardiomegaly and vascular congestion.   Electronically Signed   By: Richardean Sale M.D.   On: 12/31/2014 09:01    ECG pending   Echocardiogram 01/20/15  Study Conclusions  - Left ventricle: The cavity size was normal. Systolic function was moderately to severely reduced. The estimated ejection fraction was in the range of 30%  to 35%. Wall motion was normal; there were no regional wall motion abnormalities. Doppler parameters are consistent with abnormal left ventricular relaxation (grade 1 diastolic dysfunction). Acoustic contrast opacification revealed a medium-sized, 2.2 cm (L) x 1.2 cm (W), apicalthrombusassociated with a hypokinetic  segment. - Mitral valve: There was mild regurgitation. - Right ventricle: Systolic function was moderately reduced. - Pulmonary arteries: Systolic pressure was moderately increased. PA peak pressure: 62 mm Hg (S). - Pericardium, extracardiac: A trivial pericardial effusion was identified posterior to the heart.    ASSESSMENT AND PLAN  Principal Problem:   Lactic acidosis Active Problems:   Obesity   Essential hypertension   GERD   Psoriasis   NAUSEA AND VOMITING   Chronic diastolic heart failure   Diabetes mellitus with renal manifestation   Anemia, iron deficiency   Diarrhea   Sepsis   1. Biventricular Systolic Dysfunction: Newly reduced LV systolic function with EF of 30-35%, was 50-55% on previous echo in 2014. Also with moderate RV systolic dysfunction with moderately elevated PA pressure of 62 mm Hg. She denies CP and no worsening dyspnea beyond her baseline. However she is a diabetic on insulin, thus may not have typical anginal symptoms. No wall motion abnormalities were seen on echo. She has been suffering from a GI illness over the last several weeks which may be a possible etiology. However, with newly reduced LV dysfunction, will need to r/o myocardial ischemia. Agree with cycling cardiac enzymes x 3. Her most recent EKG was 12/17/14 which showed RBBB and LPFB (new compared to prior EKGs). Will obtain a 12 lead today. Given her stage IV CKD with Scr at 2.05, would recommend NST first to assess for ischemia, given potential risk for further renal damage from [facility]. We will wait a few days until pursuing stress test. For now, continue medical therapy with ASA and statin. Given LV dysfunction, she would benefit from addition of a BB but will hold initiation for now given potential abdominal ischemia.  She is not a candidate for ACE/ARB therapy given her CKD. Continue hydralazine. Can add Imdur if needed for further afterload reduction. Monitor volume status. Strict I/Os, daily  weights and low sodium diet.   2. LV Thrombus: visualized on 2D echo. Medium-sized, 2.2 cm (L) x 1.2 cm (W). In the setting of reduced LV systolic function. She will need anticoagulation. Her Hgb is stable. Given her CKD, will need to use warfarin and likely IV heparin bridge.   3. Abdominal Pain: In the setting of LV thrombus and elevated lactic acid of 3.07, this raises the concern for abdominal ischemia/ emobolization from her thrombus. Initial CT of abdomen unremarkable, but need to monitor.   MD to follow with further recommendations.    Signed, Lyda Jester, PA-C 01/21/2015, 3:27 PM

## 2015-01-21 NOTE — H&P (Addendum)
[facility] History and Physical  Patient: [name]  MRN: [id number]  DOB: 1946-06-08  DOS: the patient was seen and examined on 01/20/2015 PCP: Antony Blackbird, MD  Referring physician: Dr. Darl Householder Chief Complaint: Abdominal pain nausea vomiting and diarrhea and fall  HPI: [name] is a 69 y.o. female with Past medical history of COPD, coronary artery disease, GERD, gout, obesity, chronic diastolic CHF, obstructive sleep apnea, type 2 diabetes mellitus, essential hypertension, chronic kidney disease. The patient is presenting with complaints of abdominal pain as well as nausea vomiting and diarrhea on ongoing since last 2 months progressively worsening leading to weakness and a fall today. She mentions that while she was trying to, out of the bed she does not remember what happened and she fell on the ground. She denies any head injury and neck injury or any focal deficit. She denies any passing out episode. She denies any chest pain cough shortness of breath more than her usual. She denies any burning urination. She complains of abdominal pain which is associated with food located in the upper abdomen which is present since last 2 months and also associated with around stool is watery bowel movement on a daily basis as well as chronic nausea with one episode of vomiting on daily basis. She mentions she has stopped taking all of her medications 2 months ago and currently is not taking any medications. She denies any rash or any sick contact or any travel. With this she has been seen in ER multiple times she has also been evaluated by GI and had multiple CAT scans of her abdomen which were unremarkable.  The patient is coming from home. And at her baseline independent for most of her ADL.  Review of Systems: as mentioned in the history of present illness.  A comprehensive review of the other systems is negative.  Past Medical History  Diagnosis Date  . Essential  hypertension, benign   . Coronary atherosclerosis of native coronary artery 01/04/2006    Tiny OM1 70-90% ostial stenosis, no other CAD  . COPD (chronic obstructive pulmonary disease)   . Esophageal reflux   . Dyslipidemia   . Gout   . Colon polyps     Tubular adenomatous polyps  . Spinal stenosis of lumbar region   . Bulging lumbar disc   . Chronic diastolic heart failure   . PNA (pneumonia) 2012    With pleural effusion, requiring thoracentesis  . Chronic bronchitis   . On home oxygen therapy   . OSA (obstructive sleep apnea)     Marginally compliant with CPAP  . Type II diabetes mellitus   . Arthritis   . Chronic lower back pain   . Diverticulosis   . GERD (gastroesophageal reflux disease)   . Anxiety   . Hyperlipidemia   . Sarcoidosis of lung   . CKD (chronic kidney disease) stage 4, GFR 15-29 ml/min   . IBS (irritable bowel syndrome)   . Cataracts, both eyes   . POEUMPNT(614.4)    Past Surgical History  Procedure Laterality Date  . Vesicovaginal fistula closure w/  total abdominal hysterectomy  1992  . Bilateral total knee replacements Bilateral Rt=5/04 & Lft=1/09    by DrAlusio  . 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 Zella Richer  . Appendectomy  1957  . Tonsillectomy  1968  . Cholecystectomy  1980's  . Abdominal hysterectomy  1992  . Reduction mammaplasty Bilateral 1980's  . Cardiac catheterization  1990's  .  Thoracentesis  2012  . Joint replacement    . Av fistula placement Left 12/31/2013    Procedure: ARTERIOVENOUS (AV) FISTULA CREATION;  Surgeon: Elam Dutch, MD;  Location: [facility];  Service: Vascular;  Laterality: Left;  . Colonoscopy w/ biopsies and polypectomy    . Av fistula placement Left 03/18/2014    Procedure: ARTERIOVENOUS (AV) FISTULA CREATION- LEFT BRACHIOCEPHALIC ;  Surgeon: Elam Dutch, MD;  Location: [facility] OR;  Service: Vascular;  Laterality: Left;  . Ligation of arteriovenous  fistula Left 03/18/2014    Procedure: LIGATION OF ARTERIOVENOUS   FISTULA- LEFT RADIOCEPHALIC;  Surgeon: Elam Dutch, MD;  Location: [place];  Service: Vascular;  Laterality: Left;   Social History:  reports that she has never smoked. She has never used smokeless tobacco. She reports that she does not drink alcohol or use illicit drugs.  Allergies  Allergen Reactions  . Adhesive [Tape] Other (See Comments)    Blisters   . Avelox [Moxifloxacin Hcl In Nacl] Other (See Comments)    GI upset  . Codeine Other (See Comments)    "crazy"   . Guaifenesin Nausea And Vomiting    Takes Mucinex at home without issue  . Latex Swelling  . Oxycodone Nausea And Vomiting    Takes Percocet at home without issue    Family History  Problem Relation Age of Onset  . Diabetes Mother   . Heart disease Mother   . Hyperlipidemia Mother   . Varicose Veins Mother   . Breast cancer Maternal Aunt   . Heart disease Father   . Deep vein thrombosis Father   . Hyperlipidemia Father   . Heart disease Sister     before age 84  . Cancer Sister   . Diabetes Sister   . Hyperlipidemia Sister   . Heart attack Sister   . Heart disease Brother   . Diabetes Brother   . Hyperlipidemia Brother   . Heart attack Brother   . Hyperlipidemia Sister     Prior to Admission medications   Medication Sig Start Date End Date Taking? Authorizing Provider  clotrimazole-betamethasone (LOTRISONE) cream Apply 1 application topically 2 (two) times daily as needed (eczema).  01/18/14  Yes Historical Provider, MD  esomeprazole (NEXIUM) 40 MG capsule Take 1 capsule (40 mg total) by mouth 2 (two) times daily. 01/01/15  Yes Irene Shipper, MD  ferrous sulfate 325 (65 FE) MG tablet Take 325 mg by mouth daily with breakfast.   Yes Historical Provider, MD  fluocinonide cream (LIDEX) 1.61 % Apply 1 application topically 2 (two) times daily.   Yes Historical Provider, MD  fluticasone (FLONASE) 50 MCG/ACT nasal spray Place 2 sprays into both nostrils daily as needed for allergies or rhinitis.    Yes  Historical Provider, MD  Fluticasone-Salmeterol (ADVAIR) 250-50 MCG/DOSE AEPB Inhale 1 puff into the lungs 2 (two) times daily. 09/25/14  Yes Noralee Space, MD  furosemide (LASIX) 80 MG tablet Take 2 tablets (160 mg total) by mouth 2 (two) times daily. 09/09/14  Yes Albertine Patricia, MD  hydrALAZINE (APRESOLINE) 50 MG tablet Take 1 tablet (50 mg total) by mouth 3 (three) times daily. 09/09/14  Yes Albertine Patricia, MD  HYDROcodone-acetaminophen (NORCO/VICODIN) 5-325 MG per tablet Take 1 tablet by mouth every 6 (six) hours as needed for moderate pain. 12/21/14  Yes Christopher Lawyer, PA-C  Insulin Glargine 300 UNIT/ML SOPN Inject 70 Units into the skin daily.   Yes Historical Provider, MD  insulin  lispro (HUMALOG KWIKPEN) 100 UNIT/ML KiwkPen Inject 0.1 mLs (10 Units total) into the skin 3 (three) times daily. Take 10 units  (plus additional 4-12 units based on sliding scale) with breakfast, 10 units (plus additional 4-12 units based on sliding scale) with and 10 units (+ plus additional 4-12 units based on sliding scale) with dinner.Inject 35-40 Units into the skin 3 (three) times daily. 09/09/14  Yes Albertine Patricia, MD  metolazone (ZAROXOLYN) 5 MG tablet Take 5 mg by mouth daily as needed (high blood pressure).    Yes Historical Provider, MD  metoprolol succinate (TOPROL-XL) 25 MG 24 hr tablet Take 12.5 mg by mouth 2 (two) times daily.   Yes Historical Provider, MD  Multiple Vitamins-Minerals (MULTIVITAMIN WITH MINERALS) tablet Take 1 tablet by mouth daily.   Yes Historical Provider, MD  ondansetron (ZOFRAN) 4 MG tablet Take 1 tablet (4 mg total) by mouth every 8 (eight) hours as needed for nausea or vomiting. 12/31/14  Yes Robynn Pane, MD  pregabalin (LYRICA) 100 MG capsule Take 100 mg by mouth at bedtime.    Yes Historical Provider, MD  promethazine (PHENERGAN) 25 MG tablet Take 1 tablet (25 mg total) by mouth every 4 (four) hours. 01/01/15  Yes Irene Shipper, MD  triamcinolone cream (KENALOG) 0.1 % Apply  1 application topically 2 (two) times daily.   Yes Historical Provider, MD  acetaminophen (TYLENOL) 500 MG tablet Take 1,000 mg by mouth 2 (two) times daily as needed (pain).     Historical Provider, MD  albuterol (PROVENTIL HFA;VENTOLIN HFA) 108 (90 BASE) MCG/ACT inhaler Inhale 2 puffs into the lungs every 4 (four) hours as needed for wheezing or shortness of breath.     Historical Provider, MD  albuterol (PROVENTIL) (2.5 MG/3ML) 0.083% nebulizer solution Take 2.5 mg by nebulization every 2 (two) hours as needed for wheezing or shortness of breath.    Historical Provider, MD  Alpha-D-Galactosidase (BEANO PO) Take 1 tablet by mouth daily.    Historical Provider, MD  Apremilast (OTEZLA) 30 MG TABS Take 30 mg by mouth daily.     Historical Provider, MD  aspirin EC 81 MG tablet Take 81 mg by mouth daily.    Historical Provider, MD  atorvastatin (LIPITOR) 20 MG tablet Take 20 mg by mouth at bedtime.    Historical Provider, MD    Physical Exam: Filed Vitals:   01/20/15 2135 01/20/15 2201 01/21/15 0430 01/21/15 0435  BP: 172/92 160/100    Pulse: 81 54    Temp: 98.4 F (36.9 C) 97.9 F (36.6 C) 99.1 F (37.3 C)   TempSrc:  Oral Oral   Resp: 16 20 18    Height:  5' 2.4" (1.585 m)    Weight:  86.637 kg (191 lb)  87.408 kg (192 lb 11.2 oz)  SpO2: 97% 96% 100%     General: Alert, Awake and Oriented to Time, Place and Person. Appear in moderate distress Eyes: PERRL ENT: Oral Mucosa dry. Neck: no JVD Cardiovascular: S1 and S2 Present, no Murmur, Peripheral Pulses Present Respiratory: Bilateral Air entry equal and Decreased,  Clear to Auscultation, no Crackles, no wheezes Abdomen: Bowel Sound present, Soft and diffusely mildly tender Skin: no Rash Extremities: Trace Pedal edema, no calf tenderness Neurologic: Grossly no focal neuro deficit.  Labs on Admission:  CBC:  Recent Labs Lab 01/20/15 1459  WBC 7.4  NEUTROABS 3.7  HGB 15.8*  HCT 48.1*  MCV 88.7  PLT 375    CMP  Component Value Date/Time   NA 137 01/21/2015 0020   K 3.7 01/21/2015 0020   CL 111 01/21/2015 0020   CO2 18* 01/21/2015 0020   GLUCOSE 95 01/21/2015 0020   BUN 15 01/21/2015 0020   CREATININE 2.05* 01/21/2015 0020   CALCIUM 7.6* 01/21/2015 0020   CALCIUM 12.8* 10/17/2012 1734   PROT 4.5* 01/21/2015 0020   ALBUMIN 1.8* 01/21/2015 0020   AST 59* 01/21/2015 0020   ALT 11* 01/21/2015 0020   ALKPHOS 257* 01/21/2015 0020   BILITOT 0.7 01/21/2015 0020   GFRNONAA 24* 01/21/2015 0020   GFRAA 28* 01/21/2015 0020     Recent Labs Lab 01/20/15 1459  LIPASE 14*     Recent Labs Lab 01/20/15 1702  CKTOTAL 223   BNP (last 3 results)  Recent Labs  09/01/14 0930 09/02/14 0510 09/03/14 0428  BNP 1273.9* 1159.9* 1107.3*    ProBNP (last 3 results)  Recent Labs  04/26/14 2358 06/24/14 0945  PROBNP 3725.0* 324.0*     Radiological Exams on Admission: Ct Abdomen Pelvis Wo Contrast  01/20/2015   CLINICAL DATA:  69 year old female with 3-4 week history of nausea, vomiting, diarrhea, weakness and abdominal pain while eating. History of trauma from a fall today.  EXAM: CT ABDOMEN AND PELVIS WITHOUT CONTRAST  TECHNIQUE: Multidetector CT imaging of the abdomen and pelvis was performed following the standard protocol without IV contrast.  COMPARISON:  CT of the abdomen and pelvis 12/21/2014.  FINDINGS: Lower chest: Mild cylindrical bronchiectasis, thickening of the peribronchovascular interstitium and architectural distortion in the lower lobes of the lungs bilaterally, which could be post infectious, or could be related to recurrent aspiration. Mild cardiomegaly. Atherosclerotic calcifications in the left main and left anterior descending coronary artery.  Hepatobiliary: Status post cholecystectomy. Irregular area of low attenuation and calcification in the posterior aspect of the right lobe of the liver is similar to remote prior studies dating back to at least 08/25/2013, favored to be  related to sequela of remote trauma. No new hepatic lesions.  Pancreas: No definite pancreatic mass or surrounding inflammatory changes on today's noncontrast CT examination.  Spleen: Status post splenectomy. Multiple small splenules noted in the left side of the abdomen.  Adrenals/Urinary Tract: The unenhanced appearance of the adrenal glands bilaterally and the right kidney is normal. Mild scarring along the lateral aspect of the upper pole of the left kidney, likely related to prior splenectomy. Left kidney is otherwise normal in appearance. No hydroureteronephrosis. Urinary bladder is normal in appearance.  Stomach/Bowel: The unenhanced appearance of the stomach is normal. No pathologic dilatation of small bowel or colon. A few scattered colonic diverticulae are noted, without surrounding inflammatory changes to suggest an acute diverticulitis at this time.  Vascular/Lymphatic: Moderate atherosclerosis throughout the abdominal and pelvic vasculature, without definite aneurysm. No lymphadenopathy noted in the abdomen or pelvis on today's noncontrast CT examination.  Reproductive: Status post hysterectomy.  Ovaries are atrophic.  Other: No significant volume of ascites.  No pneumoperitoneum.  Musculoskeletal: There are no aggressive appearing lytic or blastic lesions noted in the visualized portions of the skeleton.  IMPRESSION: 1. No acute findings in the abdomen or pelvis on today's noncontrast CT examination to account for the patient's symptoms. 2. The appearance of the lower lobes of the lungs could suggest post infectious scarring, but could also be seen in the setting of recurrent aspiration. Clinical correlation is suggested. 3. Mild cardiomegaly. 4. Atherosclerosis, including left anterior descending coronary artery disease. Please note that although the presence  of coronary artery calcium documents the presence of coronary artery disease, the severity of this disease and any potential stenosis cannot be  assessed on this non-gated CT examination. Assessment for potential risk factor modification, dietary therapy or pharmacologic therapy may be warranted, if clinically indicated. 5. Status post splenectomy. Multiple splenules are noted throughout the left side of the abdomen. 6. Status post cholecystectomy. 7. Additional incidental findings, as above.   Electronically Signed   By: Vinnie Langton M.D.   On: 01/20/2015 19:38   Assessment/Plan Principal Problem:   Lactic acidosis Active Problems:   Obesity   Essential hypertension   GERD   Psoriasis   NAUSEA AND VOMITING   Chronic diastolic heart failure   Diabetes mellitus with renal manifestation   Anemia, iron deficiency   Diarrhea   1. Lactic acidosis  Suspected Community-acquired pneumonia Suspected sepsis  The patient is presenting with complaints of off abdominal pain nausea vomiting and diarrhea with generalized fatigue and weakness and lethargy leading to a fall today. She does not have any focal deficit on my examination not denies any head injury or headache. She complained of abdominal pain and therefore a CT of the abdomen was obtained which is showing no acute abnormality with possible infectious scarring in the lung or recurrent aspiration.  With this finding the patient has history of splenectomy and has been on immuneosuppressive medication until May, I will obtain sepsis workup which shows significantly elevated procalcitonin level, suggesting high likelihood of a bacterial infection. With this she will be admitted in telemetry unit. I would continue IV hydration, but we will be watchful secondary to her history of CHF. Monitor ins and outs and daily weight. Recheck lactic acid level. Follow cultures. Currently she will be broadly treated with vancomycin and Zosyn as well as will add Flagyl to cover her for possibility of C. Difficile. Contact precautions.  2. History of diabetes mellitus. Placing her on sliding  scale holding her oral hypoglycemic agents.  3. Chronic diastolic heart failure. Coronary artery disease. Continuing aspirin and Lipitor as well as hydralazine in view of high blood pressure. Holding other medications.  4. Chronic kidney disease. Serum creatinine appears stable. Patient has a fistula but not on hemodialysis Continue close monitoring.  5. History of COPD. Continuing home inhalers.  6. Fall. Probable secondary to lethargic and weakness. Next and will check orthostatic and get echocardiogram in morning  Advance goals of care discussion: Partial code as per documentation. patient "does not want to live on any machines"   DVT Prophylaxis: subcutaneous Heparin Nutrition: Clear liquid diet  Disposition: Admitted as inpatient, telemetry unit.  Author: Berle Mull, MD [facility] Pager: [phone]   If 7PM-7AM, please contact night-coverage [email or web address] Password [email or web address]

## 2015-01-21 NOTE — Progress Notes (Signed)
CRITICAL VALUE ALERT  Critical value received: 0.9 Magnesium   Date of notification:  01/21/2015  Time of notification:  8:22 AM  Critical value read back:Yes.    Nurse who received alert:  Girtha Rm  MD notified (1st page):  Dr. Dyann Kief  Time of first page: 8:22 AM  MD notified (2nd page): Dr. Dyann Kief  Time of second page: 8:38 AM  Responding MD:  Dr. Dyann Kief  Time MD responded:  8:39

## 2015-01-21 NOTE — Progress Notes (Signed)
[name], Cardiology PA made aware of abnormal EKG and Troponin level.

## 2015-01-21 NOTE — Progress Notes (Signed)
ANTICOAGULATION CONSULT NOTE - Initial Consult  Pharmacy Consult for Heparin and Coumadin Indication: LV thrombus  Allergies  Allergen Reactions  . Adhesive [Tape] Other (See Comments)    Blisters   . Avelox [Moxifloxacin Hcl In Nacl] Other (See Comments)    GI upset  . Codeine Other (See Comments)    "crazy"   . Guaifenesin Nausea And Vomiting    Takes Mucinex at home without issue  . Latex Swelling  . Oxycodone Nausea And Vomiting    Takes Percocet at home without issue    Patient Measurements: Height: 5' 2.4" (158.5 cm) Weight: 192 lb 11.2 oz (87.408 kg) IBW/kg (Calculated) : 51.02 Heparin Dosing Weight: 71.7 kg  Vital Signs: Temp: 98.4 F (36.9 C) (06/21 1213) Temp Source: Oral (06/21 1213) BP: 136/71 mmHg (06/21 1530) Pulse Rate: 84 (06/21 1530)  Labs:  Recent Labs  01/20/15 1459 01/20/15 1702 01/21/15 0020 01/21/15 0650 01/21/15 1523  HGB 15.8*  --   --  14.0  --   HCT 48.1*  --   --  42.7  --   PLT 375  --   --  353  --   CREATININE 2.45*  --  2.05*  --   --   CKTOTAL  --  223  --   --   --   TROPONINI  --   --   --   --  5.64*    Estimated Creatinine Clearance: 28 mL/min (by C-G formula based on Cr of 2.05).  Assessment:  Pharmacy dosing Vanc and Zosyn since 6/20 pm.  To begin IV heparin and Coumadin this afternoon for LV thrombus.  Had been on Heparin 5000 units sq q8hrs, last given at ~2:30pm today.  No baseline PT/INR, will add. Also on Flagyl IV, so may be sensitive to initial Coumadin doses.   She reports taking Coumadin in the past for a short time, but does not recall the reason.  No bleeding at that time.  Goal of Therapy:  INR 2-3 Heparin level 0.3-0.7 units/ml Monitor platelets by anticoagulation protocol: Yes   Plan:   Stop sq heparin.  Heparin 2500 units IV bolus, then infusion at 1150 units/hr.  Heparin level ~8 hrs after drip begins.  Coumadin 5 mg x 1 tonight.  Daily heparin level, PT/INR, and CBC.  Coumadin book and video  ordered.    Coumadin education prior to discharge.  Arty Baumgartner,  Pager: [phone] 01/21/2015,4:43 PM

## 2015-01-21 NOTE — Progress Notes (Signed)
Dr. Dyann Kief made aware of Magnesium level of 1.4.

## 2015-01-21 NOTE — Progress Notes (Signed)
[facility] PROGRESS NOTE  Tekia A Marijo File JOA:[id number] DOB: 02-Jul-1946 DOA: 01/20/2015 PCP: Antony Blackbird, MD  Assessment/Plan: 1-nausea, vomiting, diarrhea and abd pain: with inability to keep things down -patient has been PPI for the last 2-3 weeks w/o improvement -will ask GI to see and consider EGD if appropriate -gi pathogen panel ordered -due to elevated lactic acid, elevated procalcitonin and low grade temp; started on sepsis order with vanc, zosyn and flagyl. Will monitor and narrow/disocntinue as soon as possible -continue PRN antiemetics, GI cocktail and pepcid -if C. 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% -big decrease in EF, with hypokinesis and apical thrombus -will check troponin -cardiology consulted -daily weights -strict intake an doutput  4-essential HTN: BP stable -will continue current meds  5-acute on CKD stage 4: will monitor Cr trend -most likely due to dehydration -improved with IVF's  6-chronic resp failure due to COPD: will continue oxygen supplementation -no wheezing -will continue home bronchodilators regimen  7-diabetes with nephropathy: continue SSI -holding oral hypoglycemic agents  Code Status: Partial (no intubation) Family Communication: sister at bedside Disposition Plan: to be determine, remains inpatient and on telemetry for now    Consultants:  GI ([facility] GI)  Cardiology   Procedures:  See below for x-ray reports   2-D echo Systolic function was moderately to severely reduced. The estimated ejection fraction was in the range of 30% to 35%. Wall motion was normal; there were no regional wall motion abnormalities. Doppler parameters are consistent with abnormal left ventricular  relaxation (grade 1 diastolic dysfunction). Acoustic contrast opacification revealed a medium-sized, 2.2 cm (L) x 1.2 cm (W), apicalthrombusassociated with a hypokinetic segment. - Mitral valve: There was mild regurgitation. - Right ventricle: Systolic function was moderately reduced. - Pulmonary arteries: Systolic pressure was moderately increased. PA peak pressure: 62 mm Hg (S). - Pericardium, extracardiac: A trivial pericardial effusion was identified posterior to the heart.  Antibiotics:  Vanc  Zosyn  Flagyl   HPI/Subjective: Temp up to 99.9; still nauseated and with abd pain. No further vomiting. Patient lactic acid is resolved and renal function improved with IVF's. Patient Mg is low and she is having intermittent episodes of transient VT.  Objective: Filed Vitals:   01/21/15 1213  BP: 155/70  Pulse: 81  Temp:   Resp: 16    Intake/Output Summary (Last 24 hours) at 01/21/15 1331 Last data filed at 01/21/15 1217  Gross per 24 hour  Intake   2330 ml  Output    350 ml  Net   1980 ml   Filed Weights   01/20/15 2201 01/21/15 0435  Weight: 86.637 kg (191 lb) 87.408 kg (192 lb 11.2 oz)    Exam:   General:  Feeling slightly SOB, no CP and afebrile. No vomiting, but still nauseated and complaining of abd pain. Temp max 99.1  Cardiovascular: S1 and S2, no rubs or gallops  Respiratory: good air movement, no wheezing, [place] in place and good O2 sat on 2L  Abdomen: soft, diffusely tender to palpation (but mainly mid abd), no distentsion, no guarding, positive BS  Musculoskeletal: trace edema, no cyanosis   Data Reviewed: Basic Metabolic Panel:  Recent Labs Lab 01/20/15 1459 01/21/15 0020 01/21/15 0650  NA 140 137  --   K 3.1* 3.7  --   CL 108  111  --   CO2 22 18*  --   GLUCOSE 128* 95  --   BUN 16 15  --   CREATININE 2.45* 2.05*  --   CALCIUM 8.3* 7.6*  --   MG  --   --  0.9*   Liver Function Tests:  Recent Labs Lab 01/20/15 1459  01/21/15 0020  AST 38 59*  ALT 11* 11*  ALKPHOS 304* 257*  BILITOT 0.6 0.7  PROT 5.6* 4.5*  ALBUMIN 2.1* 1.8*    Recent Labs Lab 01/20/15 1459  LIPASE 14*   CBC:  Recent Labs Lab 01/20/15 1459 01/21/15 0650  WBC 7.4 6.7  NEUTROABS 3.7 3.7  HGB 15.8* 14.0  HCT 48.1* 42.7  MCV 88.7 87.5  PLT 375 353   Cardiac Enzymes:  Recent Labs Lab 01/20/15 1702  CKTOTAL 223   BNP (last 3 results)  Recent Labs  09/01/14 0930 09/02/14 0510 09/03/14 0428  BNP 1273.9* 1159.9* 1107.3*    ProBNP (last 3 results)  Recent Labs  04/26/14 2358 06/24/14 0945  PROBNP 3725.0* 324.0*    CBG:  Recent Labs Lab 01/20/15 2241 01/21/15 0822 01/21/15 1202  GLUCAP 83 113* 124*    Studies: Ct Abdomen Pelvis Wo Contrast  01/20/2015   CLINICAL DATA:  69 year old female with 3-4 week history of nausea, vomiting, diarrhea, weakness and abdominal pain while eating. History of trauma from a fall today.  EXAM: CT ABDOMEN AND PELVIS WITHOUT CONTRAST  TECHNIQUE: Multidetector CT imaging of the abdomen and pelvis was performed following the standard protocol without IV contrast.  COMPARISON:  CT of the abdomen and pelvis 12/21/2014.  FINDINGS: Lower chest: Mild cylindrical bronchiectasis, thickening of the peribronchovascular interstitium and architectural distortion in the lower lobes of the lungs bilaterally, which could be post infectious, or could be related to recurrent aspiration. Mild cardiomegaly. Atherosclerotic calcifications in the left main and left anterior descending coronary artery.  Hepatobiliary: Status post cholecystectomy. Irregular area of low attenuation and calcification in the posterior aspect of the right lobe of the liver is similar to remote prior studies dating back to at least 08/25/2013, favored to be related to sequela of remote trauma. No new hepatic lesions.  Pancreas: No definite pancreatic mass or surrounding inflammatory changes on today's noncontrast CT  examination.  Spleen: Status post splenectomy. Multiple small splenules noted in the left side of the abdomen.  Adrenals/Urinary Tract: The unenhanced appearance of the adrenal glands bilaterally and the right kidney is normal. Mild scarring along the lateral aspect of the upper pole of the left kidney, likely related to prior splenectomy. Left kidney is otherwise normal in appearance. No hydroureteronephrosis. Urinary bladder is normal in appearance.  Stomach/Bowel: The unenhanced appearance of the stomach is normal. No pathologic dilatation of small bowel or colon. A few scattered colonic diverticulae are noted, without surrounding inflammatory changes to suggest an acute diverticulitis at this time.  Vascular/Lymphatic: Moderate atherosclerosis throughout the abdominal and pelvic vasculature, without definite aneurysm. No lymphadenopathy noted in the abdomen or pelvis on today's noncontrast CT examination.  Reproductive: Status post hysterectomy.  Ovaries are atrophic.  Other: No significant volume of ascites.  No pneumoperitoneum.  Musculoskeletal: There are no aggressive appearing lytic or blastic lesions noted in the visualized portions of the skeleton.  IMPRESSION: 1. No acute findings in the abdomen or pelvis on today's noncontrast CT examination to account for the patient's symptoms. 2. The appearance of the lower lobes of the lungs could suggest post infectious scarring, but could  also be seen in the setting of recurrent aspiration. Clinical correlation is suggested. 3. Mild cardiomegaly. 4. Atherosclerosis, including left anterior descending coronary artery disease. Please note that although the presence of coronary artery calcium documents the presence of coronary artery disease, the severity of this disease and any potential stenosis cannot be assessed on this non-gated CT examination. Assessment for potential risk factor modification, dietary therapy or pharmacologic therapy may be warranted, if  clinically indicated. 5. Status post splenectomy. Multiple splenules are noted throughout the left side of the abdomen. 6. Status post cholecystectomy. 7. Additional incidental findings, as above.   Electronically Signed   By: Vinnie Langton M.D.   On: 01/20/2015 19:38    Scheduled Meds: . antiseptic oral rinse  7 mL Mouth Rinse BID  . aspirin EC  81 mg Oral Daily  . atorvastatin  20 mg Oral QHS  . famotidine (PEPCID) IV  20 mg Intravenous Q24H  . ferrous sulfate  325 mg Oral Q breakfast  . heparin  5,000 Units Subcutaneous 3 times per day  . hydrALAZINE  50 mg Oral TID  . insulin aspart  0-5 Units Subcutaneous QHS  . insulin aspart  0-9 Units Subcutaneous TID WC  . metronidazole  500 mg Intravenous Q8H  . mometasone-formoterol  2 puff Inhalation BID  . piperacillin-tazobactam (ZOSYN)  IV  3.375 g Intravenous 3 times per day  . sodium chloride  3 mL Intravenous Q12H  . vancomycin  1,000 mg Intravenous Q24H   Continuous Infusions: . sodium chloride      Principal Problem:   Lactic acidosis Active Problems:   Obesity   Essential hypertension   GERD   Psoriasis   NAUSEA AND VOMITING   Chronic diastolic heart failure   Diabetes mellitus with renal manifestation   Anemia, iron deficiency   Diarrhea   Sepsis    Time spent: 30 minutes    Barton Dubois  [facility] Pager [phone]. If 7PM-7AM, please contact night-coverage at [email or web address], password [facility] 01/21/2015, 1:31 PM  LOS: 1 day

## 2015-01-21 NOTE — Progress Notes (Signed)
Patient has arrived to 5W16 from ED via stretcher.  She is responsive to voice and oriented.  She denies any pain or discomfort at this time.

## 2015-01-21 NOTE — Progress Notes (Signed)
Pt had 6beats of Vtach. Dr.Madera aware at bedside. VSS.

## 2015-01-22 DIAGNOSIS — I2109 ST elevation (STEMI) myocardial infarction involving other coronary artery of anterior wall: Secondary | ICD-10-CM

## 2015-01-22 DIAGNOSIS — N184 Chronic kidney disease, stage 4 (severe): Secondary | ICD-10-CM

## 2015-01-22 DIAGNOSIS — K219 Gastro-esophageal reflux disease without esophagitis: Secondary | ICD-10-CM

## 2015-01-22 DIAGNOSIS — R197 Diarrhea, unspecified: Secondary | ICD-10-CM

## 2015-01-22 DIAGNOSIS — I513 Intracardiac thrombosis, not elsewhere classified: Secondary | ICD-10-CM

## 2015-01-22 DIAGNOSIS — I214 Non-ST elevation (NSTEMI) myocardial infarction: Secondary | ICD-10-CM

## 2015-01-22 DIAGNOSIS — E43 Unspecified severe protein-calorie malnutrition: Secondary | ICD-10-CM

## 2015-01-22 DIAGNOSIS — N179 Acute kidney failure, unspecified: Secondary | ICD-10-CM | POA: Insufficient documentation

## 2015-01-22 DIAGNOSIS — E876 Hypokalemia: Secondary | ICD-10-CM

## 2015-01-22 LAB — GLUCOSE, CAPILLARY
GLUCOSE-CAPILLARY: 139 mg/dL — AB (ref 65–99)
GLUCOSE-CAPILLARY: 144 mg/dL — AB (ref 65–99)
GLUCOSE-CAPILLARY: 111 mg/dL — AB (ref 65–99)
Glucose-Capillary: 123 mg/dL — ABNORMAL HIGH (ref 65–99)

## 2015-01-22 LAB — BASIC METABOLIC PANEL
Anion gap: 12 (ref 5–15)
BUN: 18 mg/dL (ref 6–20)
CALCIUM: 7.6 mg/dL — AB (ref 8.9–10.3)
CHLORIDE: 107 mmol/L (ref 101–111)
CO2: 15 mmol/L — AB (ref 22–32)
Creatinine, Ser: 2.93 mg/dL — ABNORMAL HIGH (ref 0.44–1.00)
GFR calc Af Amer: 18 mL/min — ABNORMAL LOW (ref >60)
GFR calc non Af Amer: 16 mL/min — ABNORMAL LOW (ref >60)
Glucose, Bld: 127 mg/dL — ABNORMAL HIGH (ref 65–99)
POTASSIUM: 3.6 mmol/L (ref 3.5–5.1)
SODIUM: 134 mmol/L — AB (ref 135–145)

## 2015-01-22 LAB — COMPREHENSIVE METABOLIC PANEL
ALT: 12 U/L — ABNORMAL LOW (ref 14–54)
ANION GAP: 10 (ref 5–15)
AST: 35 U/L (ref 15–41)
Albumin: 1.6 g/dL — ABNORMAL LOW (ref 3.5–5.0)
Alkaline Phosphatase: 249 U/L — ABNORMAL HIGH (ref 38–126)
BILIRUBIN TOTAL: 0.6 mg/dL (ref 0.3–1.2)
BUN: 18 mg/dL (ref 6–20)
CO2: 18 mmol/L — AB (ref 22–32)
Calcium: 7.5 mg/dL — ABNORMAL LOW (ref 8.9–10.3)
Chloride: 106 mmol/L (ref 101–111)
Creatinine, Ser: 2.78 mg/dL — ABNORMAL HIGH (ref 0.44–1.00)
GFR calc Af Amer: 19 mL/min — ABNORMAL LOW (ref >60)
GFR calc non Af Amer: 17 mL/min — ABNORMAL LOW (ref >60)
GLUCOSE: 144 mg/dL — AB (ref 65–99)
Potassium: 3 mmol/L — ABNORMAL LOW (ref 3.5–5.1)
Sodium: 134 mmol/L — ABNORMAL LOW (ref 135–145)
Total Protein: 4.2 g/dL — ABNORMAL LOW (ref 6.5–8.1)

## 2015-01-22 LAB — CBC
HCT: 38.5 % (ref 36.0–46.0)
Hemoglobin: 13 g/dL (ref 12.0–15.0)
MCH: 29.1 pg (ref 26.0–34.0)
MCHC: 33.8 g/dL (ref 30.0–36.0)
MCV: 86.3 fL (ref 78.0–100.0)
Platelets: 317 10*3/uL (ref 150–400)
RBC: 4.46 MIL/uL (ref 3.87–5.11)
RDW: 15.5 % (ref 11.5–15.5)
WBC: 8.1 10*3/uL (ref 4.0–10.5)

## 2015-01-22 LAB — HEMOGLOBIN A1C
Hgb A1c MFr Bld: 7.1 % — ABNORMAL HIGH (ref 4.8–5.6)
Mean Plasma Glucose: 157 mg/dL

## 2015-01-22 LAB — HEPARIN LEVEL (UNFRACTIONATED)
Heparin Unfractionated: 0.66 IU/mL (ref 0.30–0.70)
Heparin Unfractionated: 0.54 IU/mL (ref 0.30–0.70)

## 2015-01-22 LAB — MAGNESIUM
MAGNESIUM: 1.9 mg/dL (ref 1.7–2.4)
Magnesium: 1.8 mg/dL (ref 1.7–2.4)

## 2015-01-22 LAB — PROTIME-INR
INR: 1.49 (ref 0.00–1.49)
Prothrombin Time: 18.1 seconds — ABNORMAL HIGH (ref 11.6–15.2)

## 2015-01-22 LAB — AMMONIA: Ammonia: 38 umol/L — ABNORMAL HIGH (ref 9–35)

## 2015-01-22 LAB — TSH: TSH: 0.523 u[IU]/mL (ref 0.350–4.500)

## 2015-01-22 MED ORDER — LOPERAMIDE HCL 2 MG PO CAPS
2.0000 mg | ORAL_CAPSULE | Freq: Two times a day (BID) | ORAL | Status: DC
  Administered 2015-01-22 – 2015-02-02 (×21): 2 mg via ORAL
  Filled 2015-01-22 (×29): qty 1

## 2015-01-22 MED ORDER — SODIUM BICARBONATE 650 MG PO TABS
650.0000 mg | ORAL_TABLET | Freq: Three times a day (TID) | ORAL | Status: DC
  Administered 2015-01-22 – 2015-01-25 (×8): 650 mg via ORAL
  Filled 2015-01-22 (×10): qty 1

## 2015-01-22 MED ORDER — POTASSIUM CHLORIDE CRYS ER 20 MEQ PO TBCR
40.0000 meq | EXTENDED_RELEASE_TABLET | ORAL | Status: AC
  Administered 2015-01-22 (×2): 40 meq via ORAL
  Filled 2015-01-22 (×2): qty 2

## 2015-01-22 MED ORDER — WARFARIN SODIUM 5 MG PO TABS
5.0000 mg | ORAL_TABLET | Freq: Once | ORAL | Status: AC
  Administered 2015-01-22: 5 mg via ORAL
  Filled 2015-01-22: qty 1

## 2015-01-22 NOTE — Progress Notes (Signed)
ANTICOAGULATION CONSULT NOTE - Follow Up Consult  Pharmacy Consult for Heparin / Coumadin Indication: LV thrombus   Labs:  Recent Labs  01/20/15 1459 01/20/15 1702 01/21/15 0020 01/21/15 0650 01/21/15 1523 01/21/15 1830 01/21/15 1941 01/22/15 0135 01/22/15 1205  HGB 15.8*  --   --  14.0  --   --   --  13.0  --   HCT 48.1*  --   --  42.7  --   --   --  38.5  --   PLT 375  --   --  353  --   --   --  317  --   LABPROT  --   --   --   --   --   --  17.7* 18.1*  --   INR  --   --   --   --   --   --  1.45 1.49  --   HEPARINUNFRC  --   --   --   --   --   --   --  0.66 0.54  CREATININE 2.45*  --  2.05*  --   --   --   --  2.78*  --   CKTOTAL  --  223  --   --   --   --   --   --   --   TROPONINI  --   --   --   --  5.64* 5.37*  --   --   --     Assessment/Plan:  69yo female therapeutic on heparin for LV thrombus INR trending up slowly = 1.49 today  Continue heparin at current rate Coumadin 5 mg po x 1 dose tonight Daily labs  Thank you Anette Guarneri, PharmD   01/22/2015,1:22 PM

## 2015-01-22 NOTE — Progress Notes (Signed)
[facility] PROGRESS NOTE  Leolia A Marijo File WFU:[id number] DOB: 12/29/1945 DOA: 01/20/2015 PCP: Antony Blackbird, MD  Assessment/Plan: 1-nausea, vomiting, diarrhea and abd pain: with inability to keep things down -patient has been PPI for the last 2-3 weeks w/o improvement -will ask GI to see and consider EGD if appropriate -gi pathogen panel ordered ad pending -due to elevated lactic acid, elevated procalcitonin and low grade temp; started on sepsis order with vanc, zosyn and flagyl.Will d/c flagyl today. Will monitor and d/c rest of abx's as her symptoms could be explained by NSTEMI and severe dehydration -continue PRN antiemetics, GI cocktail and pepcid -if C. 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% and NSTEMI -troponin elevated -big decrease in EF, with hypokinesis and apical thrombus -cardiology consulted and on board; will follow rec's -daily weights -strict intake an doutput  4-essential HTN: -BP stable -will continue current meds  5-acute on CKD stage 4: will monitor Cr trend -most likely due to dehydration from GI loses  -initially improved with IVF's; now 2.9  6-chronic resp failure due to COPD: will continue oxygen supplementation -no wheezing -will continue home bronchodilators regimen  7-diabetes with nephropathy: continue SSI -holding oral hypoglycemic agents  8-metabolic acidosis -will start bicarb -most likely associated with renal failure (will consult renal in am) -patient if ended having a cath might get push to HD needs  Code Status: Partial (no intubation) Family Communication: sister at bedside Disposition Plan: to be determine, remains inpatient and on telemetry for now    Consultants:  GI (LB  GI)  Cardiology   Procedures:  See below for x-ray reports   2-D echo Systolic function was moderately to severely reduced. The estimated ejection fraction was in the range of 30% to 35%. Wall motion was normal; there were no regional wall motion abnormalities. Doppler parameters are consistent with abnormal left ventricular relaxation (grade 1 diastolic dysfunction). Acoustic contrast opacification revealed a medium-sized, 2.2 cm (L) x 1.2 cm (W), apicalthrombusassociated with a hypokinetic segment. - Mitral valve: There was mild regurgitation. - Right ventricle: Systolic function was moderately reduced. - Pulmonary arteries: Systolic pressure was moderately increased. PA peak pressure: 62 mm Hg (S). - Pericardium, extracardiac: A trivial pericardial effusion was identified posterior to the heart.  Antibiotics:  Vanc 6/21  Zosyn 6/21  Flagyl stop on 6/22  HPI/Subjective: Still nauseated and with some abd pain. No further vomiting; reports some diarrhea (but less than yesterday). Patient lactic acid is resolved. Patient Mg is low and she is having intermittent episodes of transient VT.  Objective: Filed Vitals:   01/22/15 1726  BP: 120/56  Pulse:   Temp:   Resp:     Intake/Output Summary (Last 24 hours) at 01/22/15 1905 Last data filed at 01/22/15 1845  Gross per 24 hour  Intake 1382.15 ml  Output    400 ml  Net 982.15 ml   Filed Weights   01/20/15 2201 01/21/15 0435 01/22/15 0524  Weight: 86.637 kg (191 lb) 87.408 kg (192 lb 11.2 oz) 89.8 kg (197 lb 15.6 oz)    Exam:   General:  Feeling better and less SOB, no CP and has remained afebrile. No vomiting, but still nauseated and with diarrhea (even improved). Patient still with abd pain but better.  Cardiovascular: S1 and S2, no rubs or gallops  Respiratory: good air movement, no wheezing, [place] in place and good O2 sat on 2L  Abdomen: soft, diffusely tender to palpation (but mainly mid  abd), no distentsion, no guarding, positive BS  Musculoskeletal: trace edema bilaterally, no cyanosis   Data Reviewed: Basic Metabolic Panel:  Recent Labs Lab 01/20/15 1459 01/21/15 0020 01/21/15 0650 01/21/15 1523 01/22/15 0135 01/22/15 1205  NA 140 137  --   --  134* 134*  K 3.1* 3.7  --   --  3.0* 3.6  CL 108 111  --   --  106 107  CO2 22 18*  --   --  18* 15*  GLUCOSE 128* 95  --   --  144* 127*  BUN 16 15  --   --  18 18  CREATININE 2.45* 2.05*  --   --  2.78* 2.93*  CALCIUM 8.3* 7.6*  --   --  7.5* 7.6*  MG  --   --  0.9* 1.4* 1.9 1.8   Liver Function Tests:  Recent Labs Lab 01/20/15 1459 01/21/15 0020 01/22/15 0135  AST 38 59* 35  ALT 11* 11* 12*  ALKPHOS 304* 257* 249*  BILITOT 0.6 0.7 0.6  PROT 5.6* 4.5* 4.2*  ALBUMIN 2.1* 1.8* 1.6*    Recent Labs Lab 01/20/15 1459  LIPASE 14*   CBC:  Recent Labs Lab 01/20/15 1459 01/21/15 0650 01/22/15 0135  WBC 7.4 6.7 8.1  NEUTROABS 3.7 3.7  --   HGB 15.8* 14.0 13.0  HCT 48.1* 42.7 38.5  MCV 88.7 87.5 86.3  PLT 375 353 317   Cardiac Enzymes:  Recent Labs Lab 01/20/15 1702 01/21/15 1523 01/21/15 1830  CKTOTAL 223  --   --   TROPONINI  --  5.64* 5.37*   BNP (last 3 results)  Recent Labs  09/02/14 0510 09/03/14 0428 01/21/15 1830  BNP 1159.9* 1107.3* 1056.8*    ProBNP (last 3 results)  Recent Labs  04/26/14 2358 06/24/14 0945  PROBNP 3725.0* 324.0*    CBG:  Recent Labs Lab 01/21/15 1754 01/21/15 2048 01/22/15 0804 01/22/15 1154 01/22/15 1647  GLUCAP 109* 139* 123* 139* 144*    Studies: Ct Abdomen Pelvis Wo Contrast  01/20/2015   CLINICAL DATA:  69 year old female with 3-4 week history of nausea, vomiting, diarrhea, weakness and abdominal pain while eating. History of trauma from a fall today.  EXAM: CT ABDOMEN AND PELVIS WITHOUT CONTRAST  TECHNIQUE: Multidetector CT imaging of the abdomen and pelvis was performed following the standard protocol without IV contrast.   COMPARISON:  CT of the abdomen and pelvis 12/21/2014.  FINDINGS: Lower chest: Mild cylindrical bronchiectasis, thickening of the peribronchovascular interstitium and architectural distortion in the lower lobes of the lungs bilaterally, which could be post infectious, or could be related to recurrent aspiration. Mild cardiomegaly. Atherosclerotic calcifications in the left main and left anterior descending coronary artery.  Hepatobiliary: Status post cholecystectomy. Irregular area of low attenuation and calcification in the posterior aspect of the right lobe of the liver is similar to remote prior studies dating back to at least 08/25/2013, favored to be related to sequela of remote trauma. No new hepatic lesions.  Pancreas: No definite pancreatic mass or surrounding inflammatory changes on today's noncontrast CT examination.  Spleen: Status post splenectomy. Multiple small splenules noted in the left side of the abdomen.  Adrenals/Urinary Tract: The unenhanced appearance of the adrenal glands bilaterally and the right kidney is normal. Mild scarring along the lateral aspect of the upper pole of  the left kidney, likely related to prior splenectomy. Left kidney is otherwise normal in appearance. No hydroureteronephrosis. Urinary bladder is normal in appearance.  Stomach/Bowel: The unenhanced appearance of the stomach is normal. No pathologic dilatation of small bowel or colon. A few scattered colonic diverticulae are noted, without surrounding inflammatory changes to suggest an acute diverticulitis at this time.  Vascular/Lymphatic: Moderate atherosclerosis throughout the abdominal and pelvic vasculature, without definite aneurysm. No lymphadenopathy noted in the abdomen or pelvis on today's noncontrast CT examination.  Reproductive: Status post hysterectomy.  Ovaries are atrophic.  Other: No significant volume of ascites.  No pneumoperitoneum.  Musculoskeletal: There are no aggressive appearing lytic or blastic  lesions noted in the visualized portions of the skeleton.  IMPRESSION: 1. No acute findings in the abdomen or pelvis on today's noncontrast CT examination to account for the patient's symptoms. 2. The appearance of the lower lobes of the lungs could suggest post infectious scarring, but could also be seen in the setting of recurrent aspiration. Clinical correlation is suggested. 3. Mild cardiomegaly. 4. Atherosclerosis, including left anterior descending coronary artery disease. Please note that although the presence of coronary artery calcium documents the presence of coronary artery disease, the severity of this disease and any potential stenosis cannot be assessed on this non-gated CT examination. Assessment for potential risk factor modification, dietary therapy or pharmacologic therapy may be warranted, if clinically indicated. 5. Status post splenectomy. Multiple splenules are noted throughout the left side of the abdomen. 6. Status post cholecystectomy. 7. Additional incidental findings, as above.   Electronically Signed   By: Vinnie Langton M.D.   On: 01/20/2015 19:38    Scheduled Meds: . antiseptic oral rinse  7 mL Mouth Rinse BID  . aspirin EC  81 mg Oral Daily  . atorvastatin  20 mg Oral QHS  . famotidine (PEPCID) IV  20 mg Intravenous Q24H  . ferrous sulfate  325 mg Oral Q breakfast  . hydrALAZINE  50 mg Oral TID  . insulin aspart  0-5 Units Subcutaneous QHS  . insulin aspart  0-9 Units Subcutaneous TID WC  . isosorbide mononitrate  30 mg Oral Daily  . loperamide  2 mg Oral BID  . mometasone-formoterol  2 puff Inhalation BID  . piperacillin-tazobactam (ZOSYN)  IV  3.375 g Intravenous 3 times per day  . sodium chloride  3 mL Intravenous Q12H  . vancomycin  1,000 mg Intravenous Q24H  . Warfarin - Pharmacist Dosing Inpatient   Does not apply q1800   Continuous Infusions: . sodium chloride    . heparin 1,150 Units/hr (01/22/15 1110)    Principal Problem:   Lactic acidosis Active  Problems:   Obesity   Essential hypertension   GERD   Psoriasis   NAUSEA AND VOMITING   Chronic diastolic heart failure   Diabetes mellitus with renal manifestation   Anemia, iron deficiency   Diarrhea   Sepsis   Protein-calorie malnutrition, severe    Time spent: 30 minutes    Barton Dubois  [facility] Pager [phone]. If 7PM-7AM, please contact night-coverage at [email or web address], password [facility] 01/22/2015, 7:05 PM  LOS: 2 days

## 2015-01-22 NOTE — Progress Notes (Signed)
Patient has 14 beats Vtach. Patient denies any associated symptoms. VS:  Dr. Dyann Kief made aware. Orders received.   Patient tearful and crying about frequent IV sticks in right arm. Pt stated "why can't they stick my left arm? My access isn't working the the doctors told me that if I got a new one it would be in my right arm anyway". Dr. Dyann Kief notified and okay for IV sticks and lab draws in left arm.

## 2015-01-22 NOTE — Progress Notes (Signed)
ANTICOAGULATION CONSULT NOTE - Follow Up Consult  Pharmacy Consult for heparin Indication: LV thrombus   Labs:  Recent Labs  01/20/15 1459 01/20/15 1702 01/21/15 0020 01/21/15 0650 01/21/15 1523 01/21/15 1830 01/21/15 1941 01/22/15 0135  HGB 15.8*  --   --  14.0  --   --   --  13.0  HCT 48.1*  --   --  42.7  --   --   --  38.5  PLT 375  --   --  353  --   --   --  317  LABPROT  --   --   --   --   --   --  17.7* 18.1*  INR  --   --   --   --   --   --  1.45 1.49  HEPARINUNFRC  --   --   --   --   --   --   --  0.66  CREATININE 2.45*  --  2.05*  --   --   --   --   --   CKTOTAL  --  223  --   --   --   --   --   --   TROPONINI  --   --   --   --  5.64* 5.37*  --   --     Assessment/Plan:  69yo female therapeutic on heparin with initial dosing for LV thrombus. Will continue gtt at current rate and confirm stable with additional level.   Wynona Neat, PharmD, BCPS  01/22/2015,2:08 AM

## 2015-01-22 NOTE — Telephone Encounter (Signed)
Left a message for patient to call back. 

## 2015-01-22 NOTE — Progress Notes (Signed)
Patient Name: [name] Date of Encounter: 01/22/2015  Principal Problem:   Lactic acidosis Active Problems:   Obesity   Essential hypertension   GERD   Psoriasis   NAUSEA AND VOMITING   Chronic diastolic heart failure   Diabetes mellitus with renal manifestation   Anemia, iron deficiency   Diarrhea   Sepsis   Protein-calorie malnutrition, severe  SUBJECTIVE  Denies chest pain or palpitation. SOB with exertion.   CURRENT MEDS . antiseptic oral rinse  7 mL Mouth Rinse BID  . aspirin EC  81 mg Oral Daily  . atorvastatin  20 mg Oral QHS  . famotidine (PEPCID) IV  20 mg Intravenous Q24H  . ferrous sulfate  325 mg Oral Q breakfast  . hydrALAZINE  50 mg Oral TID  . insulin aspart  0-5 Units Subcutaneous QHS  . insulin aspart  0-9 Units Subcutaneous TID WC  . isosorbide mononitrate  30 mg Oral Daily  . metronidazole  500 mg Intravenous Q8H  . mometasone-formoterol  2 puff Inhalation BID  . piperacillin-tazobactam (ZOSYN)  IV  3.375 g Intravenous 3 times per day  . potassium chloride  40 mEq Oral Q4H  . sodium chloride  3 mL Intravenous Q12H  . vancomycin  1,000 mg Intravenous Q24H  . warfarin   Does not apply Once  . Warfarin - Pharmacist Dosing Inpatient   Does not apply q1800    OBJECTIVE  Filed Vitals:   01/21/15 1706 01/21/15 1952 01/21/15 2216 01/22/15 0524  BP: 131/55 140/64 130/62 123/58  Pulse: 84 77  92  Temp:  98.8 F (37.1 C)  98.7 F (37.1 C)  TempSrc:  Oral  Oral  Resp:  17  18  Height:      Weight:    197 lb 15.6 oz (89.8 kg)  SpO2:  100%  99%    Intake/Output Summary (Last 24 hours) at 01/22/15 0936 Last data filed at 01/22/15 0932  Gross per 24 hour  Intake 1722.15 ml  Output    100 ml  Net 1622.15 ml   Filed Weights   01/20/15 2201 01/21/15 0435 01/22/15 0524  Weight: 191 lb (86.637 kg) 192 lb 11.2 oz (87.408 kg) 197 lb 15.6 oz (89.8 kg)    PHYSICAL EXAM  General: Pleasant, NAD.  Neuro: Alert and oriented X 3. Moves all  extremities spontaneously.  Psych: Normal affect. HEENT:  Normal  Neck: Supple without bruits or JVD. Lungs:  Resp regular and unlabored, CTA. Heart: RRR no s3, s4, or murmurs. Abdomen: Soft, non-tender, non-distended, BS + x 4.  Extremities: No clubbing, cyanosis or edema. DP/PT/Radials 2+ and equal bilaterally.  Accessory Clinical Findings  CBC  Recent Labs  01/20/15 1459 01/21/15 0650 01/22/15 0135  WBC 7.4 6.7 8.1  NEUTROABS 3.7 3.7  --   HGB 15.8* 14.0 13.0  HCT 48.1* 42.7 38.5  MCV 88.7 87.5 86.3  PLT 375 353 270   Basic Metabolic Panel  Recent Labs  01/21/15 0020  01/21/15 1523 01/22/15 0135  NA 137  --   --  134*  K 3.7  --   --  3.0*  CL 111  --   --  106  CO2 18*  --   --  18*  GLUCOSE 95  --   --  144*  BUN 15  --   --  18  CREATININE 2.05*  --   --  2.78*  CALCIUM 7.6*  --   --  7.5*  MG  --   < >  1.4* 1.9  < > = values in this interval not displayed. Liver Function Tests  Recent Labs  01/21/15 0020 01/22/15 0135  AST 59* 35  ALT 11* 12*  ALKPHOS 257* 249*  BILITOT 0.7 0.6  PROT 4.5* 4.2*  ALBUMIN 1.8* 1.6*    Recent Labs  01/20/15 1459  LIPASE 14*   Cardiac Enzymes  Recent Labs  01/20/15 1702 01/21/15 1523 01/21/15 1830  CKTOTAL 223  --   --   TROPONINI  --  5.64* 5.37*   Hemoglobin A1C  Recent Labs  01/21/15 0650  HGBA1C 7.1*   Thyroid Function Tests  Recent Labs  01/22/15 0135  TSH 0.523    TELE  NSR at rate of 80s. Occasional irregular heart rate and frequent PVCs. 14 runs of NSVT at 7:24AM today.   Echocardiogram 01/20/15 Study Conclusions  - Left ventricle: The cavity size was normal. Systolic function was moderately to severely reduced. The estimated ejection fraction was in the range of 30% to 35%. Wall motion was normal; there were no regional wall motion abnormalities. Doppler parameters are consistent with abnormal left ventricular relaxation (grade 1 diastolic dysfunction). Acoustic  contrast opacification revealed a medium-sized, 2.2 cm (L) x 1.2 cm (W), apicalthrombusassociated with a hypokinetic segment. - Mitral valve: There was mild regurgitation. - Right ventricle: Systolic function was moderately reduced. - Pulmonary arteries: Systolic pressure was moderately increased. PA peak pressure: 62 mm Hg (S). - Pericardium, extracardiac: A trivial pericardial effusion was identified posterior to the heart.  ASSESSMENT AND PLAN  69 y/o female with h/o chronic diastolic CHF, HTN, HLD, DM, COPD, and CKD, admitted for abdominal pain and weakness. Also found to have new reduction in LV systolic function and a LV thrombus.   1. Biventricular Systolic Dysfunction:  - Newly reduced LV systolic function with EF of 30-35%, was 50-55% on previous echo in 2014. Also with moderate RV systolic dysfunction with moderately elevated PA pressure of 62 mm Hg.  - She denies CP and no worsening dyspnea beyond her baseline. - Her most recent EKG was 12/17/14 which showed RBBB and LPFB (new compared to prior EKGs).  - Continue heparin GTT, LA nitrate, low dose BB, statin, hydralazine - BNP of 1056, euvolemic on exam.  - She is not a candidate for ACE/ARB therapy given her CKD.  - Monitor volume status. Strict I/Os, daily weights and low sodium diet.   2. LV Thrombus:  - visualized on 2D echo. Medium-sized, 2.2 cm (L) x 1.2 cm (W). In the setting of reduced LV systolic function.  Her Hgb is stable. Given her CKD,  IV heparin bridge to warfarin.   4. NSTEMI - Trop 5.64 -->5.37. Myoview once stable. On heparin GTT and then warfarin. Currently not a candidate for LHC.   5. NSVT - 14 runs of NSVT this AM at 7:24. Asymptomatic. Continue to monitor. Consider increasing potassium supplement to BID. Will check BMET and Mg.  6. Abdominal Pain: - per GI  7. CKD stage IV - per primary  8. Hypokalemia - as above  Tax adviser

## 2015-01-22 NOTE — Progress Notes (Signed)
Daily Rounding Note  01/22/2015, 12:56 PM  LOS: 2 days   SUBJECTIVE:       Tolerating clears this lunch.  Some nausea this AM.  Overall nausea improved.  4 loose stools yesterday, one today.   Still feels generally unwell.  No SOB  OBJECTIVE:         Vital signs in last 24 hours:    Temp:  [98.7 F (37.1 C)-98.8 F (37.1 C)] 98.7 F (37.1 C) (06/22 0524) Pulse Rate:  [77-92] 92 (06/22 0524) Resp:  [17-18] 18 (06/22 0524) BP: (123-140)/(55-71) 127/63 mmHg (06/22 1110) SpO2:  [99 %-100 %] 100 % (06/22 0935) Weight:  [197 lb 15.6 oz (89.8 kg)] 197 lb 15.6 oz (89.8 kg) (06/22 0524) Last BM Date: 01/20/15 Filed Weights   01/20/15 2201 01/21/15 0435 01/22/15 0524  Weight: 191 lb (86.637 kg) 192 lb 11.2 oz (87.408 kg) 197 lb 15.6 oz (89.8 kg)   General: looks chronically ill. Comfortable Heart: RRR Chest: clear bil but reduced BS.  No dyspnea or cough Abdomen: soft, NT, ND.  BS active.  Extremities: no CCE Neuro/Psych:  Cooperative, pleasant, no gross deficits.   Intake/Output from previous day: 06/21 0701 - 06/22 0700 In: 1297.8 [P.O.:970; I.V.:27.8; IV Piggyback:300] Out: 100 [Urine:100]  Intake/Output this shift: Total I/O In: 724.4 [P.O.:200; I.V.:374.4; IV Piggyback:150] Out: -   Lab Results:  Recent Labs  01/20/15 1459 01/21/15 0650 01/22/15 0135  WBC 7.4 6.7 8.1  HGB 15.8* 14.0 13.0  HCT 48.1* 42.7 38.5  PLT 375 353 317   BMET  Recent Labs  01/20/15 1459 01/21/15 0020 01/22/15 0135  NA 140 137 134*  K 3.1* 3.7 3.0*  CL 108 111 106  CO2 22 18* 18*  GLUCOSE 128* 95 144*  BUN _0 CREATININE 2.45* 2.05* 2.78*  CALCIUM 8.3* 7.6* 7.5*   LFT  Recent Labs  01/20/15 1459 01/21/15 0020 01/22/15 0135  PROT 5.6* 4.5* 4.2*  ALBUMIN 2.1* 1.8* 1.6*  AST 38 59* 35  ALT 11* 11* 12*  ALKPHOS 304* 257* 249*  BILITOT 0.6 0.7 0.6   PT/INR  Recent Labs  01/21/15 1941 01/22/15 0135    LABPROT 17.7* 18.1*  INR 1.45 1.49   Hepatitis Panel No results for input(s): HEPBSAG, HCVAB, HEPAIGM, HEPBIGM in the last 72 hours.  Studies/Results: Ct Abdomen Pelvis Wo Contrast  01/20/2015   CLINICAL DATA:  69 year old female with 3-4 week history of nausea, vomiting, diarrhea, weakness and abdominal pain while eating. History of trauma from a fall today.  EXAM: CT ABDOMEN AND PELVIS WITHOUT CONTRAST  TECHNIQUE: Multidetector CT imaging of the abdomen and pelvis was performed following the standard protocol without IV contrast.  COMPARISON:  CT of the abdomen and pelvis 12/21/2014.  FINDINGS: Lower chest: Mild cylindrical bronchiectasis, thickening of the peribronchovascular interstitium and architectural distortion in the lower lobes of the lungs bilaterally, which could be post infectious, or could be related to recurrent aspiration. Mild cardiomegaly. Atherosclerotic calcifications in the left main and left anterior descending coronary artery.  Hepatobiliary: Status post cholecystectomy. Irregular area of low attenuation and calcification in the posterior aspect of the right lobe of the liver is similar to remote prior studies dating back to at least 08/25/2013, favored to be related to sequela of remote trauma. No new hepatic lesions.  Pancreas: No definite pancreatic mass or surrounding inflammatory changes on today's noncontrast CT examination.  Spleen: Status post splenectomy. Multiple small  splenules noted in the left side of the abdomen.  Adrenals/Urinary Tract: The unenhanced appearance of the adrenal glands bilaterally and the right kidney is normal. Mild scarring along the lateral aspect of the upper pole of the left kidney, likely related to prior splenectomy. Left kidney is otherwise normal in appearance. No hydroureteronephrosis. Urinary bladder is normal in appearance.  Stomach/Bowel: The unenhanced appearance of the stomach is normal. No pathologic dilatation of small bowel or colon. A  few scattered colonic diverticulae are noted, without surrounding inflammatory changes to suggest an acute diverticulitis at this time.  Vascular/Lymphatic: Moderate atherosclerosis throughout the abdominal and pelvic vasculature, without definite aneurysm. No lymphadenopathy noted in the abdomen or pelvis on today's noncontrast CT examination.  Reproductive: Status post hysterectomy.  Ovaries are atrophic.  Other: No significant volume of ascites.  No pneumoperitoneum.  Musculoskeletal: There are no aggressive appearing lytic or blastic lesions noted in the visualized portions of the skeleton.  IMPRESSION: 1. No acute findings in the abdomen or pelvis on today's noncontrast CT examination to account for the patient's symptoms. 2. The appearance of the lower lobes of the lungs could suggest post infectious scarring, but could also be seen in the setting of recurrent aspiration. Clinical correlation is suggested. 3. Mild cardiomegaly. 4. Atherosclerosis, including left anterior descending coronary artery disease. Please note that although the presence of coronary artery calcium documents the presence of coronary artery disease, the severity of this disease and any potential stenosis cannot be assessed on this non-gated CT examination. Assessment for potential risk factor modification, dietary therapy or pharmacologic therapy may be warranted, if clinically indicated. 5. Status post splenectomy. Multiple splenules are noted throughout the left side of the abdomen. 6. Status post cholecystectomy. 7. Additional incidental findings, as above.   Electronically Signed   By: Vinnie Langton M.D.   On: 01/20/2015 19:38    Scheduled Meds: . antiseptic oral rinse  7 mL Mouth Rinse BID  . aspirin EC  81 mg Oral Daily  . atorvastatin  20 mg Oral QHS  . famotidine (PEPCID) IV  20 mg Intravenous Q24H  . ferrous sulfate  325 mg Oral Q breakfast  . hydrALAZINE  50 mg Oral TID  . insulin aspart  0-5 Units Subcutaneous QHS    . insulin aspart  0-9 Units Subcutaneous TID WC  . isosorbide mononitrate  30 mg Oral Daily  . loperamide  2 mg Oral BID  . metronidazole  500 mg Intravenous Q8H  . mometasone-formoterol  2 puff Inhalation BID  . piperacillin-tazobactam (ZOSYN)  IV  3.375 g Intravenous 3 times per day  . sodium chloride  3 mL Intravenous Q12H  . vancomycin  1,000 mg Intravenous Q24H  . warfarin  5 mg Oral ONCE-1800  . Warfarin - Pharmacist Dosing Inpatient   Does not apply q1800   Continuous Infusions: . sodium chloride    . heparin 1,150 Units/hr (01/22/15 1110)   PRN Meds:.acetaminophen **OR** acetaminophen, albuterol, fluticasone, gi cocktail, HYDROcodone-acetaminophen, ondansetron **OR** ondansetron (ZOFRAN) IV   ASSESMENT:   * Chronic n/v/d. Chronic non-focal abdominal pain.  Unrevealing non-contrast CT scans x 3 in the last 4 weeks. GES study normal 3 weeks ago. Alk phos and AST elevated, GB is out. Suggestion of cirrhosis on CT scan 12/16/14. Coags normal.  Stool pathogen panel pending Lactic acidosis at admit.  Started on sepsis protocol with Vanc/zosyn. On IV Famotidine.   * CKD stage 4 . AVF placed but non-functioning.   * IDDM  *  Protein calorie malnutriton.   * CHF. 2D echo today with EF 30 to 35% (down from 50 to 60% 09/05/14) . LV thrombus, mild, MV regurge, moderate increase of PA pressure, trivial pericardial effusion. NSTEMI.  NSVT.  Cardiology following.  On IV Heparin bridge to Coumadin.   * AMS. No asterixis on exam. Got haldol doses at 1746 yesterday but no other meds given that would lead to AMS.   * Medication non-compliance according to admission H & P.     PLAN   *  Added imodium BID, advanced to carb mod diet.   *  Stool spec collected 6/21, results pending.   *  Per Dr Carlean Purl.     [name]  01/22/2015, 12:56 PM Pager: [phone]  [facility] Attending  I have also seen and assessed the patient and agree with the advanced practitioner's  assessment and plan. My Hx and PE same.  We are seeing her for nausea and vomiting (much better), diarrhea (better some) and abdominal pain (better).  She says she is a little better - not vomiting.  ? If she has GUI Sxs from cardiac issues. So far no objective GI abnormalities on labs, stool pathogen panel (done as outpatient already) and imaging - CT's GES.  My thoughts are to address cardiac issues for now and reserve flex sig for persistent diarrhea. I do not know for sure she needs to stay on Abx. Doubt she was "septic" though those criteria may have been met. Leave on today but may recommend dc those tomorrow.   [name] Mayer, MD, [facility] [phone] (pager) 01/22/2015 3:43 PM

## 2015-01-23 ENCOUNTER — Encounter: Payer: Self-pay | Admitting: Cardiology

## 2015-01-23 DIAGNOSIS — I420 Dilated cardiomyopathy: Secondary | ICD-10-CM

## 2015-01-23 DIAGNOSIS — G43A1 Cyclical vomiting, intractable: Secondary | ICD-10-CM

## 2015-01-23 LAB — CBC
HCT: 39.1 % (ref 36.0–46.0)
Hemoglobin: 13.1 g/dL (ref 12.0–15.0)
MCH: 28.9 pg (ref 26.0–34.0)
MCHC: 33.5 g/dL (ref 30.0–36.0)
MCV: 86.1 fL (ref 78.0–100.0)
PLATELETS: 349 10*3/uL (ref 150–400)
RBC: 4.54 MIL/uL (ref 3.87–5.11)
RDW: 16.3 % — ABNORMAL HIGH (ref 11.5–15.5)
WBC: 8.2 10*3/uL (ref 4.0–10.5)

## 2015-01-23 LAB — GLUCOSE, CAPILLARY
GLUCOSE-CAPILLARY: 117 mg/dL — AB (ref 65–99)
Glucose-Capillary: 89 mg/dL (ref 65–99)
Glucose-Capillary: 109 mg/dL — ABNORMAL HIGH (ref 65–99)
Glucose-Capillary: 106 mg/dL — ABNORMAL HIGH (ref 65–99)

## 2015-01-23 LAB — PROTIME-INR
INR: 2.85 — AB (ref 0.00–1.49)
Prothrombin Time: 29.4 seconds — ABNORMAL HIGH (ref 11.6–15.2)

## 2015-01-23 LAB — HEPARIN LEVEL (UNFRACTIONATED): Heparin Unfractionated: 0.35 IU/mL (ref 0.30–0.70)

## 2015-01-23 LAB — TROPONIN I
TROPONIN I: 2.54 ng/mL — AB (ref <0.031)
Troponin I: 2.77 ng/mL (ref <0.031)
Troponin I: 3.17 ng/mL (ref <0.031)

## 2015-01-23 MED ORDER — PIPERACILLIN-TAZOBACTAM IN DEX 2-0.25 GM/50ML IV SOLN
2.2500 g | Freq: Three times a day (TID) | INTRAVENOUS | Status: DC
  Filled 2015-01-23 (×2): qty 50

## 2015-01-23 MED ORDER — FAMOTIDINE 20 MG PO TABS
20.0000 mg | ORAL_TABLET | Freq: Every day | ORAL | Status: DC
  Administered 2015-01-23 – 2015-02-02 (×11): 20 mg via ORAL
  Filled 2015-01-23 (×13): qty 1

## 2015-01-23 MED ORDER — VANCOMYCIN HCL IN DEXTROSE 1-5 GM/200ML-% IV SOLN: 1000.0000 mg | INTRAVENOUS | Status: DC

## 2015-01-23 MED ORDER — CARVEDILOL 3.125 MG PO TABS
3.1250 mg | ORAL_TABLET | Freq: Two times a day (BID) | ORAL | Status: DC
  Administered 2015-01-23 – 2015-01-24 (×2): 3.125 mg via ORAL
  Filled 2015-01-23 (×4): qty 1

## 2015-01-23 MED ORDER — SACCHAROMYCES BOULARDII 250 MG PO CAPS
250.0000 mg | ORAL_CAPSULE | Freq: Two times a day (BID) | ORAL | Status: DC
  Administered 2015-01-23 – 2015-02-02 (×20): 250 mg via ORAL
  Filled 2015-01-23 (×22): qty 1

## 2015-01-23 NOTE — Consult Note (Signed)
[name] is an 69 y.o. female referred by Dr Dyann Kief   Chief Complaint: CKD 4 HPI: 69yo BF admitted 01/20/15 with N/V/D which has been a chronic issue.  She is followed by Dr Joelyn Oms for her CKD 4 sec DM/HTN +/- sarcoidosis.  Her Scr over the past year has ranged from upper 2's to low 3's.  Scr on admission was 2.45 and today 2.9.  I/O's incomplete.  Her N/V is better but still has some diarrhea.  Hosp course complicated by finding of LV thrombus for which she is undergoing anticoagulation.  She has had 2 AVF placed that have failed with plans to place AVG when closer to needing HD.  Past Medical History  Diagnosis Date  . Essential hypertension, benign   . Coronary atherosclerosis of native coronary artery 01/04/2006    Tiny OM1 70-90% ostial stenosis, no other CAD  . COPD (chronic obstructive pulmonary disease)   . Esophageal reflux   . Dyslipidemia   . Gout   . Colon polyps 2011    Tubular adenomatous polyps  . Spinal stenosis of lumbar region     chronic low back pain.   . Bulging lumbar disc   . Chronic diastolic heart failure   . PNA (pneumonia) 2012    With pleural effusion, requiring thoracentesis  . Chronic bronchitis   . On home oxygen therapy   . OSA (obstructive sleep apnea)     Marginally compliant with CPAP  . Type II diabetes mellitus   . Arthritis   . Diverticulosis   . GERD (gastroesophageal reflux disease)   . Anxiety   . Hyperlipidemia   . Sarcoidosis of lung   . CKD (chronic kidney disease) stage 4, GFR 15-29 ml/min   . IBS (irritable bowel syndrome)   . Cataracts, both eyes   . Headache(784.0)   . Lactic acidosis 01/21/2015    Past Surgical History  Procedure Laterality Date  . Vesicovaginal fistula closure w/  total abdominal hysterectomy  1992  . Bilateral total knee replacements Bilateral Rt=5/04 & Lft=1/09    by DrAlusio  . 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 Zella Richer  . Appendectomy  1957  . Tonsillectomy  1968  . Cholecystectomy  1980's   . Abdominal hysterectomy  1992  . Reduction mammaplasty Bilateral 1980's  . Cardiac catheterization  1990's  . Thoracentesis  2012  . Joint replacement    . Av fistula placement Left 12/31/2013    Procedure: ARTERIOVENOUS (AV) FISTULA CREATION;  Surgeon: Elam Dutch, MD;  Location: [facility];  Service: Vascular;  Laterality: Left;  . Colonoscopy w/ biopsies and polypectomy    . Av fistula placement Left 03/18/2014    Procedure: ARTERIOVENOUS (AV) FISTULA CREATION- LEFT BRACHIOCEPHALIC ;  Surgeon: Elam Dutch, MD;  Location: [facility] OR;  Service: Vascular;  Laterality: Left;  . Ligation of arteriovenous  fistula Left 03/18/2014    Procedure: LIGATION OF ARTERIOVENOUS  FISTULA- LEFT RADIOCEPHALIC;  Surgeon: Elam Dutch, MD;  Location: [facility] OR;  Service: Vascular;  Laterality: Left;    Family History  Problem Relation Age of Onset  . Diabetes Mother   . Heart disease Mother   . Hyperlipidemia Mother   . Varicose Veins Mother   . Breast cancer Maternal Aunt   . Heart disease Father   . Deep vein thrombosis Father   . Hyperlipidemia Father   . Heart disease Sister     before age 33  . Cancer Sister   .  Diabetes Sister   . Hyperlipidemia Sister   . Heart attack Sister   . Heart disease Brother   . Diabetes Brother   . Hyperlipidemia Brother   . Heart attack Brother   . Hyperlipidemia Sister   FH neg for renal ds  Social History:  reports that she has never smoked. She has never used smokeless tobacco. She reports that she does not drink alcohol or use illicit drugs. Not married.  No children.  Lives by herself in [place]  Allergies:  Allergies  Allergen Reactions  . Adhesive [Tape] Other (See Comments)    Blisters   . Avelox [Moxifloxacin Hcl In Nacl] Other (See Comments)    GI upset  . Codeine Other (See Comments)    "crazy"   . Guaifenesin Nausea And Vomiting    Takes Mucinex at home without issue  . Latex Swelling  . Oxycodone Nausea And Vomiting    Takes Percocet at  home without issue    Medications Prior to Admission  Medication Sig Dispense Refill  . clotrimazole-betamethasone (LOTRISONE) cream Apply 1 application topically 2 (two) times daily as needed (eczema).     . esomeprazole (NEXIUM) 40 MG capsule Take 1 capsule (40 mg total) by mouth 2 (two) times daily. 60 capsule 3  . ferrous sulfate 325 (65 FE) MG tablet Take 325 mg by mouth daily with breakfast.    . fluocinonide cream (LIDEX) 4.49 % Apply 1 application topically 2 (two) times daily.    . fluticasone (FLONASE) 50 MCG/ACT nasal spray Place 2 sprays into both nostrils daily as needed for allergies or rhinitis.     . Fluticasone-Salmeterol (ADVAIR) 250-50 MCG/DOSE AEPB Inhale 1 puff into the lungs 2 (two) times daily. 180 each 3  . furosemide (LASIX) 80 MG tablet Take 2 tablets (160 mg total) by mouth 2 (two) times daily. 120 tablet 0  . hydrALAZINE (APRESOLINE) 50 MG tablet Take 1 tablet (50 mg total) by mouth 3 (three) times daily. 90 tablet 0  . HYDROcodone-acetaminophen (NORCO/VICODIN) 5-325 MG per tablet Take 1 tablet by mouth every 6 (six) hours as needed for moderate pain. 15 tablet 0  . Insulin Glargine 300 UNIT/ML SOPN Inject 70 Units into the skin daily.    . insulin lispro (HUMALOG KWIKPEN) 100 UNIT/ML KiwkPen Inject 0.1 mLs (10 Units total) into the skin 3 (three) times daily. Take 10 units  (plus additional 4-12 units based on sliding scale) with breakfast, 10 units (plus additional 4-12 units based on sliding scale) with and 10 units (+ plus additional 4-12 units based on sliding scale) with dinner.Inject 35-40 Units into the skin 3 (three) times daily. 15 mL 11  . metolazone (ZAROXOLYN) 5 MG tablet Take 5 mg by mouth daily as needed (high blood pressure).     . metoprolol succinate (TOPROL-XL) 25 MG 24 hr tablet Take 12.5 mg by mouth 2 (two) times daily.    . Multiple Vitamins-Minerals (MULTIVITAMIN WITH MINERALS) tablet Take 1 tablet by mouth daily.    . ondansetron (ZOFRAN) 4 MG  tablet Take 1 tablet (4 mg total) by mouth every 8 (eight) hours as needed for nausea or vomiting. 15 tablet 0  . pregabalin (LYRICA) 100 MG capsule Take 100 mg by mouth at bedtime.     . promethazine (PHENERGAN) 25 MG tablet Take 1 tablet (25 mg total) by mouth every 4 (four) hours. 30 tablet 1  . triamcinolone cream (KENALOG) 0.1 % Apply 1 application topically 2 (two) times daily.    Marland Kitchen  acetaminophen (TYLENOL) 500 MG tablet Take 1,000 mg by mouth 2 (two) times daily as needed (pain).     Marland Kitchen albuterol (PROVENTIL HFA;VENTOLIN HFA) 108 (90 BASE) MCG/ACT inhaler Inhale 2 puffs into the lungs every 4 (four) hours as needed for wheezing or shortness of breath.     Marland Kitchen albuterol (PROVENTIL) (2.5 MG/3ML) 0.083% nebulizer solution Take 2.5 mg by nebulization every 2 (two) hours as needed for wheezing or shortness of breath.    . Alpha-D-Galactosidase (BEANO PO) Take 1 tablet by mouth daily.    Marland Kitchen Apremilast (OTEZLA) 30 MG TABS Take 30 mg by mouth daily.     Marland Kitchen aspirin EC 81 MG tablet Take 81 mg by mouth daily.    Marland Kitchen atorvastatin (LIPITOR) 20 MG tablet Take 20 mg by mouth at bedtime.       Lab Results: UA: >300 0-2wbc and rbc   Recent Labs  01/21/15 0650 01/22/15 0135 01/23/15 0625  WBC 6.7 8.1 8.2  HGB 14.0 13.0 13.1  HCT 42.7 38.5 39.1  PLT 353 317 349   BMET  Recent Labs  01/21/15 0020 01/22/15 0135 01/22/15 1205  NA 137 134* 134*  K 3.7 3.0* 3.6  CL 111 106 107  CO2 18* 18* 15*  GLUCOSE 95 144* 127*  BUN 15 18 18   CREATININE 2.05* 2.78* 2.93*  CALCIUM 7.6* 7.5* 7.6*   LFT  Recent Labs  01/22/15 0135  PROT 4.2*  ALBUMIN 1.6*  AST 35  ALT 12*  ALKPHOS 249*  BILITOT 0.6   No results found.  ROS: No change in vision though recently had cataract surgery No SOB, uses O2 prn at home No CP No abd pain + diarrhea No dysuria  PHYSICAL EXAM: Blood pressure 146/57, pulse 83, temperature 98.1 F (36.7 C), temperature source Oral, resp. rate 20, height 5' 2.4" (1.585 m),  weight 90.9 kg (200 lb 6.4 oz), SpO2 95 %. HEENT: PERRLA EOMI  Mils conjunctival injection on Lt NECK:No JVD LUNGS:Clear CARDIAC:RRR wo MRG ABD:+ BS NTND EXT:0-tr edema.  Chronic venous stasis changes.  Failed AVF Lt lower and upper arm NEURO:CNI Ox3 no asterixis  Assessment: 1. CKD 4 sec DM/HTN +/- sarcoidosis.  Her Scr of 2.9 is in range of where she has been for the last several months, simply follow for now 2. Sec HPTH though I cannot find recent PTH 3. LV thrombus on heparin 4. Diarrhea 5. HTN  BP well controlled 6. DM 7. Metabolic acidosis on Na bicarb pills PLAN: 1. Daily Scr 2. The ? How long she will need coumadin.  If 3-6 months then I think there is a good chance she could make it that long without the need for HD and then AVG can be placed at that time or later depending on renal fx 3. Check PTH and PO4   Jaison Petraglia T 01/23/2015, 5:20 PM

## 2015-01-23 NOTE — Progress Notes (Signed)
[facility] PROGRESS NOTE  Daesia A Marijo File QQP:[id number] DOB: July 12, 1946 DOA: 01/20/2015 PCP: Antony Blackbird, MD  Assessment/Plan: 1-nausea, vomiting, diarrhea and abd pain: with inability to keep things down -patient has been PPI for the last 2-3 weeks w/o significant improvement; she has no half any endoscopic procedure in the last 5 years. -gi pathogen panel ordered ad pending -due to elevated lactic acid, elevated procalcitonin and low grade temp; started on sepsis order with vanc, zosyn and flagyl. patient no longer septic in appearance and no clear source of infection identified. Antibiotic has been discontinue. Patient's symptoms could be explained by NSTEMI and severe dehydration. -continue PRN antiemetics, GI cocktail and pepcid. Now that she is on anticoagulation for the thrombosis in her heart is going to be an increased risk for bleeding. Given her history of reflux and ongoing abdominal pain despite maximum therapy with PPIs, I think that she will benefit off on endoscopy. GI is on board and will follow their recommendations. Will check FOBT. -if C. 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% and NSTEMI -troponin elevated -big decrease in EF, with hypokinesis and apical thrombus -cardiology consulted and on board; will follow rec's -daily weights -strict intake and output -Not a candidate for ACE/ARB secondary to chronic kidney disease -Started on carvedilol, imdur and hydralazine -Patient is need of Myoview and potentially a left heart cath  4-essential HTN: -BP overall stable -will continue current meds  5-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  -If the patient ended up requiring to have a catheterization sheath my ended on hemodialysis. -Renal service has been consulted. Patient has been started on bicarbonate tablets. Will follow recommendations  6-chronic resp failure due to COPD: will continue oxygen supplementation -no wheezing -will continue home bronchodilators regimen  7-diabetes with nephropathy: continue SSI -holding oral hypoglycemic agents  8-metabolic acidosis -will start bicarb -most likely associated with renal failure and ongoing diarrhea -Renal service has been consulted and will follow their recommendations. -patient if ended having a cath might get push to HD needs  Code Status: Partial (no intubation) Family Communication: sister at bedside Disposition Plan: to be determine, remains inpatient and on telemetry for now    Consultants:  GI ([facility])  Cardiology   Renal service  Procedures:  See below for x-ray reports   2-D echo Systolic function was moderately to severely reduced. The estimated ejection fraction was in the range of 30% to 35%. Wall motion was normal; there were no regional wall motion abnormalities. Doppler parameters are consistent with abnormal left ventricular relaxation (grade 1 diastolic dysfunction). Acoustic contrast opacification revealed a medium-sized, 2.2 cm (L) x 1.2 cm (W), apicalthrombusassociated with a hypokinetic segment. - Mitral valve: There was mild regurgitation. - Right ventricle: Systolic function was moderately reduced. - Pulmonary arteries: Systolic pressure was moderately increased. PA peak pressure: 62 mm Hg (S). - Pericardium, extracardiac: A trivial pericardial effusion was identified posterior to the heart.  Antibiotics:  Vanc 6/21>>6/23  Zosyn 6/21>>6/23  Flagyl stop on 6/22  HPI/Subjective: Overall feeling better and has been able to tolerate some of her diet. Patient denies chest pain, shortness of  breath and has remained afebrile. Still complaining of nausea  and some abdominal pain. 2 loose stools reported so far today (6/23)  Objective: Filed Vitals:   01/23/15 1425  BP: 146/57  Pulse: 83  Temp: 98.1 F (36.7 C)  Resp: 20    Intake/Output Summary (Last 24 hours) at 01/23/15 1650 Last data filed at 01/23/15 0700  Gross per 24 hour  Intake 1110.67 ml  Output    500 ml  Net 610.67 ml   Filed Weights   01/21/15 0435 01/22/15 0524 01/23/15 0657  Weight: 87.408 kg (192 lb 11.2 oz) 89.8 kg (197 lb 15.6 oz) 90.9 kg (200 lb 6.4 oz)    Exam:   General:  Feeling better and less SOB, denies CP and has remained afebrile. No vomiting, but still have some nausea and 2 episodes of loose stools today (6/23). Patient still with abd pain, but better.  Cardiovascular: S1 and S2, no rubs or gallops, no JVD  Respiratory: good air movement, no wheezing, [place] in place and good O2 sat on 2L  Abdomen: soft, diffusely tender to palpation (but mainly mid abd), no distentsion, no guarding, positive BS  Musculoskeletal: trace edema bilaterally, no cyanosis   Data Reviewed: Basic Metabolic Panel:  Recent Labs Lab 01/20/15 1459 01/21/15 0020 01/21/15 0650 01/21/15 1523 01/22/15 0135 01/22/15 1205  NA 140 137  --   --  134* 134*  K 3.1* 3.7  --   --  3.0* 3.6  CL 108 111  --   --  106 107  CO2 22 18*  --   --  18* 15*  GLUCOSE 128* 95  --   --  144* 127*  BUN 16 15  --   --  18 18  CREATININE 2.45* 2.05*  --   --  2.78* 2.93*  CALCIUM 8.3* 7.6*  --   --  7.5* 7.6*  MG  --   --  0.9* 1.4* 1.9 1.8   Liver Function Tests:  Recent Labs Lab 01/20/15 1459 01/21/15 0020 01/22/15 0135  AST 38 59* 35  ALT 11* 11* 12*  ALKPHOS 304* 257* 249*  BILITOT 0.6 0.7 0.6  PROT 5.6* 4.5* 4.2*  ALBUMIN 2.1* 1.8* 1.6*    Recent Labs Lab 01/20/15 1459  LIPASE 14*   CBC:  Recent Labs Lab 01/20/15 1459 01/21/15 0650 01/22/15 0135 01/23/15 0625  WBC 7.4 6.7 8.1 8.2  NEUTROABS 3.7  3.7  --   --   HGB 15.8* 14.0 13.0 13.1  HCT 48.1* 42.7 38.5 39.1  MCV 88.7 87.5 86.3 86.1  PLT 375 353 317 349   Cardiac Enzymes:  Recent Labs Lab 01/20/15 1702 01/21/15 1523 01/21/15 1830 01/23/15 1122  CKTOTAL 223  --   --   --   TROPONINI  --  5.64* 5.37* 2.77*   BNP (last 3 results)  Recent Labs  09/02/14 0510 09/03/14 0428 01/21/15 1830  BNP 1159.9* 1107.3* 1056.8*    ProBNP (last 3 results)  Recent Labs  04/26/14 2358 06/24/14 0945  PROBNP 3725.0* 324.0*    CBG:  Recent Labs Lab 01/22/15 1154 01/22/15 1647 01/22/15 2128 01/23/15 0755 01/23/15 1205  GLUCAP 139* 144* 111* 89 117*    Studies: No results found.  Scheduled Meds: . antiseptic oral rinse  7 mL Mouth Rinse BID  . aspirin EC  81 mg Oral Daily  . atorvastatin  20 mg Oral QHS  . carvedilol  3.125 mg Oral BID WC  . famotidine  20 mg Oral Daily  . hydrALAZINE  50 mg  Oral TID  . insulin aspart  0-5 Units Subcutaneous QHS  . insulin aspart  0-9 Units Subcutaneous TID WC  . isosorbide mononitrate  30 mg Oral Daily  . loperamide  2 mg Oral BID  . mometasone-formoterol  2 puff Inhalation BID  . sodium bicarbonate  650 mg Oral TID  . sodium chloride  3 mL Intravenous Q12H  . Warfarin - Pharmacist Dosing Inpatient   Does not apply q1800   Continuous Infusions: . sodium chloride 20 mL/hr (01/23/15 0628)  . heparin 1,150 Units/hr (01/23/15 0631)    Principal Problem:   Lactic acidosis Active Problems:   Obesity   Essential hypertension   GERD   Psoriasis   NAUSEA AND VOMITING   Chronic diastolic heart failure   Diabetes mellitus with renal manifestation   Anemia, iron deficiency   Diarrhea   Sepsis   Protein-calorie malnutrition, severe   NSTEMI (non-ST elevated myocardial infarction)   Mural thrombus of cardiac apex   Acute renal failure superimposed on stage 4 chronic kidney disease   Hypokalemia   DCM (dilated cardiomyopathy)    Time spent: 30 minutes    Barton Dubois  [facility] Pager [phone]. If 7PM-7AM, please contact night-coverage at [email or web address], password [facility] 01/23/2015, 4:50 PM  LOS: 3 days

## 2015-01-23 NOTE — Progress Notes (Addendum)
SUBJECTIVE:  Denies chest pain or SOB but complains of abdominal discomfort and ongoing diarrhea  OBJECTIVE:   Vitals:   Filed Vitals:   01/22/15 1726 01/22/15 2130 01/23/15 0657 01/23/15 0929  BP: 120/56 134/59 127/64   Pulse:  96 80   Temp:      TempSrc:      Resp:  18 18   Height:      Weight:   200 lb 6.4 oz (90.9 kg)   SpO2:  100% 99% 100%   I&O's:   Intake/Output Summary (Last 24 hours) at 01/23/15 1004 Last data filed at 01/23/15 0700  Gross per 24 hour  Intake 1310.67 ml  Output    500 ml  Net 810.67 ml   TELEMETRY: Reviewed telemetry pt in NSR:     PHYSICAL EXAM General: Well developed, well nourished, in no acute distress Head: Eyes PERRLA, No xanthomas.   Normal cephalic and atramatic  Lungs:   Clear bilaterally to auscultation and percussion. Heart:   HRRR S1 S2 Pulses are 2+ & equal. Abdomen: Bowel sounds are positive, abdomen soft and non-tender without masses Extremities:   No clubbing, cyanosis or edema.  DP +1 Neuro: Alert and oriented X 3. Psych:  Good affect, responds appropriately   LABS: Basic Metabolic Panel:  Recent Labs  01/22/15 0135 01/22/15 1205  NA 134* 134*  K 3.0* 3.6  CL 106 107  CO2 18* 15*  GLUCOSE 144* 127*  BUN 18 18  CREATININE 2.78* 2.93*  CALCIUM 7.5* 7.6*  MG 1.9 1.8   Liver Function Tests:  Recent Labs  01/21/15 0020 01/22/15 0135  AST 59* 35  ALT 11* 12*  ALKPHOS 257* 249*  BILITOT 0.7 0.6  PROT 4.5* 4.2*  ALBUMIN 1.8* 1.6*    Recent Labs  01/20/15 1459  LIPASE 14*   CBC:  Recent Labs  01/20/15 1459 01/21/15 0650 01/22/15 0135 01/23/15 0625  WBC 7.4 6.7 8.1 8.2  NEUTROABS 3.7 3.7  --   --   HGB 15.8* 14.0 13.0 13.1  HCT 48.1* 42.7 38.5 39.1  MCV 88.7 87.5 86.3 86.1  PLT 375 353 317 349   Cardiac Enzymes:  Recent Labs  01/20/15 1702 01/21/15 1523 01/21/15 1830  CKTOTAL 223  --   --   TROPONINI  --  5.64* 5.37*   BNP: Invalid input(s): POCBNP D-Dimer: No results for  input(s): DDIMER in the last 72 hours. Hemoglobin A1C:  Recent Labs  01/21/15 0650  HGBA1C 7.1*   Fasting Lipid Panel: No results for input(s): CHOL, HDL, LDLCALC, TRIG, CHOLHDL, LDLDIRECT in the last 72 hours. Thyroid Function Tests:  Recent Labs  01/22/15 0135  TSH 0.523   Anemia Panel: No results for input(s): VITAMINB12, FOLATE, FERRITIN, TIBC, IRON, RETICCTPCT in the last 72 hours. Coag Panel:   Lab Results  Component Value Date   INR 2.85* 01/23/2015   INR 1.49 01/22/2015   INR 1.45 01/21/2015    RADIOLOGY: Ct Abdomen Pelvis Wo Contrast  01/20/2015   CLINICAL DATA:  69 year old female with 3-4 week history of nausea, vomiting, diarrhea, weakness and abdominal pain while eating. History of trauma from a fall today.  EXAM: CT ABDOMEN AND PELVIS WITHOUT CONTRAST  TECHNIQUE: Multidetector CT imaging of the abdomen and pelvis was performed following the standard protocol without IV contrast.  COMPARISON:  CT of the abdomen and pelvis 12/21/2014.  FINDINGS: Lower chest: Mild cylindrical bronchiectasis, thickening of the peribronchovascular interstitium and architectural distortion in the lower lobes of  the lungs bilaterally, which could be post infectious, or could be related to recurrent aspiration. Mild cardiomegaly. Atherosclerotic calcifications in the left main and left anterior descending coronary artery.  Hepatobiliary: Status post cholecystectomy. Irregular area of low attenuation and calcification in the posterior aspect of the right lobe of the liver is similar to remote prior studies dating back to at least 08/25/2013, favored to be related to sequela of remote trauma. No new hepatic lesions.  Pancreas: No definite pancreatic mass or surrounding inflammatory changes on today's noncontrast CT examination.  Spleen: Status post splenectomy. Multiple small splenules noted in the left side of the abdomen.  Adrenals/Urinary Tract: The unenhanced appearance of the adrenal glands  bilaterally and the right kidney is normal. Mild scarring along the lateral aspect of the upper pole of the left kidney, likely related to prior splenectomy. Left kidney is otherwise normal in appearance. No hydroureteronephrosis. Urinary bladder is normal in appearance.  Stomach/Bowel: The unenhanced appearance of the stomach is normal. No pathologic dilatation of small bowel or colon. A few scattered colonic diverticulae are noted, without surrounding inflammatory changes to suggest an acute diverticulitis at this time.  Vascular/Lymphatic: Moderate atherosclerosis throughout the abdominal and pelvic vasculature, without definite aneurysm. No lymphadenopathy noted in the abdomen or pelvis on today's noncontrast CT examination.  Reproductive: Status post hysterectomy.  Ovaries are atrophic.  Other: No significant volume of ascites.  No pneumoperitoneum.  Musculoskeletal: There are no aggressive appearing lytic or blastic lesions noted in the visualized portions of the skeleton.  IMPRESSION: 1. No acute findings in the abdomen or pelvis on today's noncontrast CT examination to account for the patient's symptoms. 2. The appearance of the lower lobes of the lungs could suggest post infectious scarring, but could also be seen in the setting of recurrent aspiration. Clinical correlation is suggested. 3. Mild cardiomegaly. 4. Atherosclerosis, including left anterior descending coronary artery disease. Please note that although the presence of coronary artery calcium documents the presence of coronary artery disease, the severity of this disease and any potential stenosis cannot be assessed on this non-gated CT examination. Assessment for potential risk factor modification, dietary therapy or pharmacologic therapy may be warranted, if clinically indicated. 5. Status post splenectomy. Multiple splenules are noted throughout the left side of the abdomen. 6. Status post cholecystectomy. 7. Additional incidental findings, as  above.   Electronically Signed   By: Vinnie Langton M.D.   On: 01/20/2015 19:38   Nm Gastric Emptying  01/02/2015   CLINICAL DATA:  Severe abdominal pain with nausea and vomiting. Abdominal bloating.  EXAM: NUCLEAR MEDICINE GASTRIC EMPTYING SCAN  TECHNIQUE: After oral ingestion of radiolabeled meal, sequential abdominal images were obtained for 4 hours. Percentage of activity emptying the stomach was calculated at 1 hour, 2 hour, 3 hour, and 4 hours.  RADIOPHARMACEUTICALS:  2.0 mCi Technetium-58m labeled sulfur colloid orally cooked with one egg with 8 oz of ginger ale.  COMPARISON:  None.  FINDINGS: Expected location of the stomach in the left upper quadrant. Ingested meal empties the stomach gradually over the course of the study.  40.3% emptied at 1 hr ( normal >= 10%)  76.85% emptied at 2 hr ( normal >= 40%)  93.18% emptied at 3 hr ( normal >= 70%)  98.25% emptied at 4 hr ( normal >= 90%)  IMPRESSION: Normal gastric emptying study.   Electronically Signed   By: Lorriane Shire M.D.   On: 01/02/2015 15:04   Dg Abd Acute W/chest  12/31/2014  CLINICAL DATA:  Nausea, vomiting and diarrhea for 1 week. Diverticulitis diagnosed approximately 2 weeks ago  EXAM: DG ABDOMEN ACUTE W/ 1V CHEST  COMPARISON:  Radiographs and CT 12/21/2014.  FINDINGS: Borderline cardiomegaly and vascular congestion are again noted, similar to the prior study. There is no confluent airspace opacity or significant pleural effusion.  The bowel gas pattern appears normal. There is no free intraperitoneal air or suspicious abdominal calcification. Scattered vascular calcifications are noted. There are surgical clips in the upper abdomen bilaterally. Mild lumbar spine degenerative changes are stable.  IMPRESSION: 1. Stable radiographs of the chest and abdomen. No acute abdominal findings identified. 2. Stable borderline cardiomegaly and vascular congestion.   Electronically Signed   By: Richardean Sale M.D.   On: 12/31/2014 09:01    ASSESSMENT AND PLAN  69 y/o female with h/o chronic diastolic CHF, HTN, HLD, DM, COPD, and CKD, admitted for abdominal pain and weakness. Also found to have new reduction in LV systolic function and a LV thrombus.   1. Biventricular Systolic Dysfunction:  - Newly reduced LV systolic function with EF of 30-35%, was 50-55% on previous echo in 2014. Also with moderate RV systolic dysfunction with moderately elevated PA pressure of 62 mm Hg.  - She denies CP and no worsening dyspnea beyond her baseline. - Her most recent EKG was 12/17/14 which showed RBBB and LPFB (new compared to prior EKGs).  - Continue heparin GTT, LA nitrate, statin, hydralazine.  Add low dose carvedilol. - BNP of 1056, euvolemic on exam.  - She is not a candidate for ACE/ARB therapy given her CKD.  - Monitor volume status. Strict I/Os, daily weights and low sodium diet.   2. LV Thrombus:  - visualized on 2D echo. Medium-sized, 2.2 cm (L) x 1.2 cm (W). In the setting of reduced LV systolic function. Her Hgb is stable. Given her CKD, IV heparin bridge to warfarin followed by pharmacy. INR is therapeutic today but pharmacy concerned could be due to Flagyl which has just been stopped.  Coumadin on hold today and reassess in am off Flagyl.  4. NSTEMI - Trop 5.64 -->5.37. High risk to progress to ESRD needing HD with cath so will plan Myoview once stable to evaluate for any large areas of ischemia. She does have coronary artery calcifications on Chest CT.  On heparin GTT and then warfarin per pharmacy.  Given her ongoing abdominal discomfort will recheck a troponin.    5. NSVT - Asymptomatic. Continue to monitor. Keep K+ >4.  Adding low dose BB  6. Abdominal Pain: - per GI  7. CKD stage IV - per primary  8. Hypokalemia - as above     Sueanne Margarita, MD  01/23/2015  10:04 AM

## 2015-01-23 NOTE — Progress Notes (Signed)
Daily Rounding Note  01/23/2015, 10:00 AM  LOS: 3 days   SUBJECTIVE:       Still with non-bloody diarrhea, overall not that much better.  No overnight BMs, 2 loose/watery this AM.  Nausea improved, now just queasy. Still anorexic and not eating much. No SOB.  No abdominal pain.  OBJECTIVE:         Vital signs in last 24 hours:    Temp:  [98.4 F (36.9 C)] 98.4 F (36.9 C) (06/22 1416) Pulse Rate:  [77-96] 80 (06/23 0657) Resp:  [18] 18 (06/23 0657) BP: (120-134)/(51-64) 127/64 mmHg (06/23 0657) SpO2:  [95 %-100 %] 100 % (06/23 0929) Weight:  [200 lb 6.4 oz (90.9 kg)] 200 lb 6.4 oz (90.9 kg) (06/23 0657) Last BM Date: 01/23/15 Filed Weights   01/21/15 0435 01/22/15 0524 01/23/15 0657  Weight: 192 lb 11.2 oz (87.408 kg) 197 lb 15.6 oz (89.8 kg) 200 lb 6.4 oz (90.9 kg)   General: looks better, not ill appearing   Heart: RRR Chest: clear bil Abdomen: soft, ND.  Active BS.  NT  Extremities: no CCE Neuro/Psych:  Pleasant, more engaged and alert.  Relaxed.    Lab Results:  Recent Labs  01/21/15 0650 01/22/15 0135 01/23/15 0625  WBC 6.7 8.1 8.2  HGB 14.0 13.0 13.1  HCT 42.7 38.5 39.1  PLT 353 317 349   BMET  Recent Labs  01/21/15 0020 01/22/15 0135 01/22/15 1205  NA 137 134* 134*  K 3.7 3.0* 3.6  CL 111 106 107  CO2 18* 18* 15*  GLUCOSE 95 144* 127*  BUN 15 18 18   CREATININE 2.05* 2.78* 2.93*  CALCIUM 7.6* 7.5* 7.6*   LFT  Recent Labs  01/20/15 1459 01/21/15 0020 01/22/15 0135  PROT 5.6* 4.5* 4.2*  ALBUMIN 2.1* 1.8* 1.6*  AST 38 59* 35  ALT 11* 11* 12*  ALKPHOS 304* 257* 249*  BILITOT 0.6 0.7 0.6   PT/INR  Recent Labs  01/22/15 0135 01/23/15 0625  LABPROT 18.1* 29.4*  INR 1.49 2.85*   Scheduled Meds: . antiseptic oral rinse  7 mL Mouth Rinse BID  . aspirin EC  81 mg Oral Daily  . atorvastatin  20 mg Oral QHS  . famotidine  20 mg Oral Daily  . ferrous sulfate  325 mg Oral Q  breakfast  . hydrALAZINE  50 mg Oral TID  . insulin aspart  0-5 Units Subcutaneous QHS  . insulin aspart  0-9 Units Subcutaneous TID WC  . isosorbide mononitrate  30 mg Oral Daily  . loperamide  2 mg Oral BID  . mometasone-formoterol  2 puff Inhalation BID  . piperacillin-tazobactam (ZOSYN)  IV  2.25 g Intravenous 3 times per day  . sodium bicarbonate  650 mg Oral TID  . sodium chloride  3 mL Intravenous Q12H  . [START ON 01/24/2015] vancomycin  1,000 mg Intravenous Q48H  . Warfarin - Pharmacist Dosing Inpatient   Does not apply q1800   Continuous Infusions: . sodium chloride 20 mL/hr (01/23/15 0628)  . heparin 1,150 Units/hr (01/23/15 0631)   PRN Meds:.acetaminophen **OR** acetaminophen, albuterol, fluticasone, gi cocktail, HYDROcodone-acetaminophen, ondansetron **OR** ondansetron (ZOFRAN) IV   ASSESMENT:   * Chronic n/v/d. Chronic, non-focal abdominal pain.  Unrevealing non-contrast CT scans x 3 in the last 4 weeks. GES study normal 3 weeks ago. Alk phos remains elevated, GB is out. Suggestion of cirrhosis on CT scan 12/16/14.  Stool pathogen panel pending.  BID imodium added yesterday.   *  Lactic acidosis, resolved.  Started on sepsis protocol with Vanc/zosyn. On IV Famotidine.  WBCs consistently normal, negative blood clx of 6/21 thus far, no fever.    * CKD stage 4 . Creatinine rising.    AVF placed but non-functioning.   * IDDM  * Protein calorie malnutriton.   * CHF. 2D echo today with EF 30 to 35% (down from 50 to 60% 09/05/14) .   *  LV thrombus. NSTEMI. NSVT. On IV Heparin bridge to Coumadin. INR therapeutic.   * AMS. No asterixis on exam. Got haldol doses at 1746 yesterday but no other meds given that would lead to AMS.   * Medication non-compliance.  Taking none of her meds x one month PTA (too nauseated)    PLAN   *  Stop po Iron.  If she needs iron, would give this parenterally as po iron frequently causes GI upset and nausea.  Ordered  iron studies   *  Stop abx.   *  Still waiting on stool pathogen panel, spec collected AM 6/21, could be 6/28 before we get results. Ordering separate C diff assay, as will result much more quickly  *  Spoke with pharmacist re stopping Heparin, she wishes to keep it in place.  Suspects that with Flagyl now discontinued the INR may drop to subtherapeutic level.     Azucena Freed  01/23/2015, 10:00 AM Pager: [phone]  Agree with Ms. Sandi Carne assessment and plan. Gatha Mayer, MD, [facility]  Cause of GI problems remains unclear May need to flex sig or colonscopy - could do on blood thinners Would probably try unprepped flex

## 2015-01-23 NOTE — Telephone Encounter (Signed)
Left a message for patient to call back. 

## 2015-01-23 NOTE — Telephone Encounter (Signed)
After multiple phone calls have been unable to reach pt. Letter mailed to pt.

## 2015-01-23 NOTE — Progress Notes (Addendum)
ANTICOAGULATION / Antibiotic CONSULT NOTE - Follow Up Consult  Pharmacy Consult for Heparin / Coumadin / Vancomycin / Zosyn Indication: LV thrombus / rule out sepsis   Labs:  Recent Labs  01/20/15 1702 01/21/15 0020 01/21/15 0650 01/21/15 1523 01/21/15 1830 01/21/15 1941 01/22/15 0135 01/22/15 1205 01/23/15 0625  HGB  --   --  14.0  --   --   --  13.0  --  13.1  HCT  --   --  42.7  --   --   --  38.5  --  39.1  PLT  --   --  353  --   --   --  317  --  349  LABPROT  --   --   --   --   --  17.7* 18.1*  --  29.4*  INR  --   --   --   --   --  1.45 1.49  --  2.85*  HEPARINUNFRC  --   --   --   --   --   --  0.66 0.54 0.35  CREATININE  --  2.05*  --   --   --   --  2.78* 2.93*  --   CKTOTAL 223  --   --   --   --   --   --   --   --   TROPONINI  --   --   --  5.64* 5.37*  --   --   --   --     Assessment/Plan:  69yo female therapeutic on heparin for LV thrombus Day # 4 of broad spectrum antibiotics -- Blood cultures negative to date, afebrile  Scr has continued to trend up while on vancomycin, Zosyn  INR up to 2.85 (may be due to Flagyl) - continue heparin for now to determine INR trend  Plan: Continue heparin at current rate No Coumadin today Decrease Zosyn to 2.25 grams iv Q 8 hours Decrease Vancomycin to 1 gram iv Q 48 hours  Consider de-escalating antibiotics to non-renally excreted antibiotics -- Rocephin? Daily labs  Thank you Anette Guarneri, PharmD   01/23/2015,8:40 AM

## 2015-01-24 ENCOUNTER — Inpatient Hospital Stay (HOSPITAL_COMMUNITY): Payer: Medicare Other

## 2015-01-24 DIAGNOSIS — R1110 Vomiting, unspecified: Secondary | ICD-10-CM

## 2015-01-24 LAB — VITAMIN B12: Vitamin B-12: 2326 pg/mL — ABNORMAL HIGH (ref 180–914)

## 2015-01-24 LAB — RETICULOCYTES
RBC.: 4.55 MIL/uL (ref 3.87–5.11)
Retic Count, Absolute: 100.1 10*3/uL (ref 19.0–186.0)
Retic Ct Pct: 2.2 % (ref 0.4–3.1)

## 2015-01-24 LAB — HEPARIN LEVEL (UNFRACTIONATED): Heparin Unfractionated: 0.43 IU/mL (ref 0.30–0.70)

## 2015-01-24 LAB — RENAL FUNCTION PANEL
Albumin: 1.6 g/dL — ABNORMAL LOW (ref 3.5–5.0)
Anion gap: 10 (ref 5–15)
BUN: 22 mg/dL — AB (ref 6–20)
CALCIUM: 8.5 mg/dL — AB (ref 8.9–10.3)
CO2: 18 mmol/L — AB (ref 22–32)
CREATININE: 3.73 mg/dL — AB (ref 0.44–1.00)
Chloride: 105 mmol/L (ref 101–111)
GFR calc Af Amer: 14 mL/min — ABNORMAL LOW (ref >60)
GFR calc non Af Amer: 12 mL/min — ABNORMAL LOW (ref >60)
GLUCOSE: 93 mg/dL (ref 65–99)
Phosphorus: 4.6 mg/dL (ref 2.5–4.6)
Potassium: 3.9 mmol/L (ref 3.5–5.1)
Sodium: 133 mmol/L — ABNORMAL LOW (ref 135–145)

## 2015-01-24 LAB — GI PATHOGEN PANEL BY PCR, STOOL

## 2015-01-24 LAB — FERRITIN: FERRITIN: 416 ng/mL — AB (ref 11–307)

## 2015-01-24 LAB — CBC
HEMATOCRIT: 39.6 % (ref 36.0–46.0)
Hemoglobin: 13 g/dL (ref 12.0–15.0)
MCH: 28.6 pg (ref 26.0–34.0)
MCHC: 32.8 g/dL (ref 30.0–36.0)
MCV: 87 fL (ref 78.0–100.0)
Platelets: 379 10*3/uL (ref 150–400)
RBC: 4.55 MIL/uL (ref 3.87–5.11)
RDW: 16.6 % — ABNORMAL HIGH (ref 11.5–15.5)
WBC: 7.5 10*3/uL (ref 4.0–10.5)

## 2015-01-24 LAB — GLUCOSE, CAPILLARY
GLUCOSE-CAPILLARY: 112 mg/dL — AB (ref 65–99)
Glucose-Capillary: 97 mg/dL (ref 65–99)
Glucose-Capillary: 95 mg/dL (ref 65–99)
Glucose-Capillary: 118 mg/dL — ABNORMAL HIGH (ref 65–99)

## 2015-01-24 LAB — IRON AND TIBC: IRON: 73 ug/dL (ref 28–170)

## 2015-01-24 LAB — PROTIME-INR
INR: 3.71 — ABNORMAL HIGH (ref 0.00–1.49)
Prothrombin Time: 35.9 seconds — ABNORMAL HIGH (ref 11.6–15.2)

## 2015-01-24 LAB — CLOSTRIDIUM DIFFICILE BY PCR: CDIFFPCR: NEGATIVE

## 2015-01-24 LAB — FOLATE: Folate: 9.4 ng/mL (ref >5.9)

## 2015-01-24 MED ORDER — CARVEDILOL 6.25 MG PO TABS
6.2500 mg | ORAL_TABLET | Freq: Two times a day (BID) | ORAL | Status: DC
Start: 2015-01-24 — End: 2015-01-31
  Administered 2015-01-24 – 2015-01-30 (×13): 6.25 mg via ORAL
  Filled 2015-01-24 (×16): qty 1

## 2015-01-24 NOTE — Progress Notes (Signed)
Utilization Review completed. Sheyna Pettibone RN BSN CM 

## 2015-01-24 NOTE — Progress Notes (Signed)
Daily Rounding Note  01/24/2015, 9:42 AM  LOS: 4 days   SUBJECTIVE:       Heparin d/c'd at 0915 today.  2 stools, loose and not large, yesterday. One early this AM.  Somewhat queasy but no n/v and tolerating solids.   Recorded tray intake of 100% on 6/21, 6/22.  Ate about 1/2 of AM tray today.  Reluctantly admits she is feeling better.   OBJECTIVE:         Vital signs in last 24 hours:    Temp:  [98.1 F (36.7 C)] 98.1 F (36.7 C) (06/24 7867) Pulse Rate:  [74-83] 77 (06/24 0827) Resp:  [18-20] 18 (06/24 0608) BP: (115-150)/(48-73) 150/64 mmHg (06/24 0827) SpO2:  [95 %-100 %] 98 % (06/24 0743) Weight:  [195 lb 15.8 oz (88.9 kg)] 195 lb 15.8 oz (88.9 kg) (06/24 0608) Last BM Date: 01/23/15 Filed Weights   01/22/15 0524 01/23/15 0657 01/24/15 6720  Weight: 197 lb 15.6 oz (89.8 kg) 200 lb 6.4 oz (90.9 kg) 195 lb 15.8 oz (88.9 kg)   General: pleasant, comfortable   Heart: RRR Chest: clear bil.  No labored breathing Abdomen: soft, NT, ND.  BS active.  obese  Extremities: no CCE Neuro/Psych:  Oriented x 3.    Lab Results:  Recent Labs  01/22/15 0135 01/23/15 0625 01/24/15 0549  WBC 8.1 8.2 7.5  HGB 13.0 13.1 13.0  HCT 38.5 39.1 39.6  PLT 317 349 379   BMET  Recent Labs  01/22/15 0135 01/22/15 1205 01/24/15 0549  NA 134* 134* 133*  K 3.0* 3.6 3.9  CL 106 107 105  CO2 18* 15* 18*  GLUCOSE 144* 127* 93  BUN 18 18 22*  CREATININE 2.78* 2.93* 3.73*  CALCIUM 7.5* 7.6* 8.5*   LFT  Recent Labs  01/22/15 0135 01/24/15 0549  PROT 4.2*  --   ALBUMIN 1.6* 1.6*  AST 35  --   ALT 12*  --   ALKPHOS 249*  --   BILITOT 0.6  --    PT/INR  Recent Labs  01/23/15 0625 01/24/15 0549  LABPROT 29.4* 35.9*  INR 2.85* 3.71*      Scheduled Meds: . antiseptic oral rinse  7 mL Mouth Rinse BID  . aspirin EC  81 mg Oral Daily  . atorvastatin  20 mg Oral QHS  . carvedilol  3.125 mg Oral BID WC  .  famotidine  20 mg Oral Daily  . hydrALAZINE  50 mg Oral TID  . insulin aspart  0-5 Units Subcutaneous QHS  . insulin aspart  0-9 Units Subcutaneous TID WC  . isosorbide mononitrate  30 mg Oral Daily  . loperamide  2 mg Oral BID  . mometasone-formoterol  2 puff Inhalation BID  . saccharomyces boulardii  250 mg Oral BID  . sodium bicarbonate  650 mg Oral TID  . sodium chloride  3 mL Intravenous Q12H  . Warfarin - Pharmacist Dosing Inpatient   Does not apply q1800   Continuous Infusions: . sodium chloride 20 mL/hr at 01/23/15 2207   PRN Meds:.acetaminophen **OR** acetaminophen, albuterol, fluticasone, gi cocktail, HYDROcodone-acetaminophen, ondansetron **OR** ondansetron (ZOFRAN) IV   ASSESMENT:   * Chronic n/v/d. Chronic, non-focal abdominal pain.  All sxs improved.  Unrevealing non-contrast CT scans x 3 in the last 4 weeks. GES study normal 6/2. Alk phos remains elevated, GB is out. Suggestion of cirrhosis on CT scan 12/16/14.  Stool pathogen panel pending. separate C  diff collected this AM and pending. BID imodium added 6/22.    * LV thrombus. NSTEMI. NSVT. Heparin d/c'd this AM.  INR supra therapeutic.  myoview planned, as high risk for AKI with cardiac cath.   * CHF. 2D echo with EF 30 to 35% (down from 50 to 60% 09/05/14) .   PLAN   She is better overall and GI signs sxs still a mystery. Stool pathogen panel pending but should be neg again as was just done as outpt. INR is up - will recheck that tomorrow - I think she is headed for an unprepped flex sig and bx which if INR in 2's should be ok   [facility] Attending  I have also seen and assessed the patient and agree with the advanced practitioner's assessment and planMy hx and PE same  Gatha Mayer, MD, [facility] [phone] (pager) 01/24/2015 11:49 AM      Azucena Freed  01/24/2015, 9:42 AM Pager: [phone]

## 2015-01-24 NOTE — Evaluation (Addendum)
Physical Therapy Evaluation Patient Details Name: [name] MRN: [id number] DOB: 10/24/1945 Today's Date: 01/24/2015   History of Present Illness  Pt is a 69 y/o female presenting with abdominal pain, N/V/D, and fall. Troponin elevated and pt was found to have NSTEMI - 2D echo revealed LV thrombus.   Clinical Impression  Pt admitted with above diagnosis. Pt currently with functional limitations due to the deficits listed below (see PT Problem List). At the time of PT eval pt was able to perform transfers and ambulation with min guard assist for safety. Pt very fatigued with gait training and required several standing rest breaks due to fatigue and SOB. Pt on RA throughout session and sats remained at 98%. Pt will benefit from skilled PT to increase their independence and safety with mobility to allow discharge to the venue listed below. Per pt, will have 24 hour assist at home from niece.     Follow Up Recommendations Home health PT;Supervision/Assistance - 24 hour    Equipment Recommendations  None recommended by PT    Recommendations for Other Services       Precautions / Restrictions Precautions Precautions: Fall Restrictions Weight Bearing Restrictions: No      Mobility  Bed Mobility Overal bed mobility: Needs Assistance Bed Mobility: Supine to Sit     Supine to sit: Supervision     General bed mobility comments: Pt was able to transition to EOB with no physical assist. Supervision for safety.   Transfers Overall transfer level: Needs assistance Equipment used: Rolling walker (2 wheeled) Transfers: Sit to/from Stand Sit to Stand: Supervision         General transfer comment: Pt was able to power-up to full standing position with no physical assist. Close guard for safety.   Ambulation/Gait Ambulation/Gait assistance: Min guard Ambulation Distance (Feet): 125 Feet Assistive device: Rolling walker (2 wheeled) Gait Pattern/deviations: Step-through  pattern;Decreased stride length;Trunk flexed Gait velocity: Decreased Gait velocity interpretation: Below normal speed for age/gender General Gait Details: Pt was able to ambulate in the hall with frequent standing rest breaks due to fatigue and SOB. Pt on RA and sats remained in high 90's throughout session.   Stairs            Wheelchair Mobility    Modified Rankin (Stroke Patients Only)       Balance Overall balance assessment: Needs assistance Sitting-balance support: Feet supported;No upper extremity supported Sitting balance-Leahy Scale: Good     Standing balance support: No upper extremity supported Standing balance-Leahy Scale: Fair                               Pertinent Vitals/Pain Pain Assessment: Faces Faces Pain Scale: Hurts a little bit Pain Location: Buttocks - states it is from diarrhea she has been having.  Pain Descriptors / Indicators: Burning Pain Intervention(s): Limited activity within patient's tolerance;Monitored during session;Repositioned    Home Living Family/patient expects to be discharged to:: Private residence Living Arrangements: Alone Available Help at Discharge: Family;Available 24 hours/day Type of Home: Apartment Home Access: Level entry     Home Layout: One level Home Equipment: Tub bench;Grab bars - toilet;Walker - 2 wheels;Walker - standard;Bedside commode;Wheelchair - Insurance risk surveyor Comments: Niece will be staying with her over the summer and will be available 24 hours.    Prior Function Level of Independence: Independent with assistive device(s)         Comments: uses walker all the  time, oxygen at night and when active. Does her own housework with some assist from nieces     Hand Dominance   Dominant Hand: Right    Extremity/Trunk Assessment   Upper Extremity Assessment: Defer to OT evaluation           Lower Extremity Assessment: Generalized weakness      Cervical /  Trunk Assessment: Normal  Communication   Communication: No difficulties  Cognition Arousal/Alertness: Awake/alert Behavior During Therapy: WFL for tasks assessed/performed Overall Cognitive Status: Within Functional Limits for tasks assessed                      General Comments      Exercises        Assessment/Plan    PT Assessment Patient needs continued PT services  PT Diagnosis Difficulty walking;Generalized weakness   PT Problem List Decreased strength;Decreased range of motion;Decreased activity tolerance;Decreased balance;Decreased mobility;Decreased knowledge of use of DME;Decreased safety awareness;Decreased knowledge of precautions;Cardiopulmonary status limiting activity  PT Treatment Interventions DME instruction;Gait training;Stair training;Functional mobility training;Therapeutic activities;Therapeutic exercise;Neuromuscular re-education;Patient/family education   PT Goals (Current goals can be found in the Care Plan section) Acute Rehab PT Goals Patient Stated Goal: Return home PT Goal Formulation: With patient Time For Goal Achievement: 01/31/15 Potential to Achieve Goals: Good    Frequency Min 3X/week   Barriers to discharge        Co-evaluation               End of Session Equipment Utilized During Treatment: Gait belt;Oxygen Activity Tolerance: Patient limited by fatigue Patient left: in chair;with call bell/phone within reach Nurse Communication: Mobility status         Time: 1401-1430 PT Time Calculation (min) (ACUTE ONLY): 29 min   Charges:   PT Evaluation $Initial PT Evaluation Tier I: 1 Procedure PT Treatments $Gait Training: 8-22 mins   PT G Codes:        Rolinda Roan 02/22/15, 2:50 PM   Rolinda Roan, PT, DPT Acute Rehabilitation Services Pager: [phone]

## 2015-01-24 NOTE — Progress Notes (Signed)
[facility] Rounding Note  Subjective:  Says doesn't feel as well as yesterday - just "yucky" but cannot be more specific Breathing is stable, still having diarrheal stools (most recent CDiff neg) Says she IS making urine but "it's kinda slow" (nothing is being recorded)  Objective Vital signs in last 24 hours: Filed Vitals:   01/23/15 2253 01/24/15 0608 01/24/15 0743 01/24/15 0827  BP: 140/73 115/48  150/64  Pulse:  79  77  Temp:  98.1 F (36.7 C)    TempSrc:  Oral    Resp:  18    Height:      Weight:  88.9 kg (195 lb 15.8 oz)    SpO2:  99% 98%    Weight change: -2 kg (-4 lb 6.6 oz)  Intake/Output Summary (Last 24 hours) at 01/24/15 1212 Last data filed at 01/23/15 2240  Gross per 24 hour  Intake    120 ml  Output      0 ml  Net    120 ml   PHYSICAL EXAM: BP 150/64 mmHg  Pulse 77  Temp(Src) 98.1 F (36.7 C) (Oral)  Resp 18  Ht 5' 2.4" (1.585 m)  Wt 88.9 kg (195 lb 15.8 oz)  BMI 35.39 kg/m2  SpO2 98% Very pleasant older AAF. NAD HEENT: [place]/AT NECK:No JVD LUNGS:Clear without crackles or wheezes CARDIAC:Regular S1S2 normal No S3 or S4 ABD:+ BS  EXT: Trace pre-tibial pitting edema. Chronic venous stasis changes/skini darkening.  Failed AVF Left lower and upper arm NEURO: Ox3 no asterixis   Labs: Basic Metabolic Panel:  Recent Labs Lab 01/20/15 1459 01/21/15 0020 01/22/15 0135 01/22/15 1205 01/24/15 0549  NA 140 137 134* 134* 133*  K 3.1* 3.7 3.0* 3.6 3.9  CL 108 111 106 107 105  CO2 22 18* 18* 15* 18*  GLUCOSE 128* 95 144* 127* 93  BUN 16 15 18 18  22*  CREATININE 2.45* 2.05* 2.78* 2.93* 3.73*  CALCIUM 8.3* 7.6* 7.5* 7.6* 8.5*  PHOS  --   --   --   --  4.6     Recent Labs Lab 01/20/15 1459 01/21/15 0020 01/22/15 0135 01/24/15 0549  AST 38 59* 35  --   ALT 11* 11* 12*  --   ALKPHOS 304* 257* 249*  --   BILITOT 0.6 0.7 0.6  --   PROT 5.6* 4.5* 4.2*  --   ALBUMIN 2.1* 1.8* 1.6* 1.6*    Recent Labs Lab 01/20/15 1459   LIPASE 14*    Recent Labs Lab 01/22/15 0135  AMMONIA 38*     Recent Labs Lab 01/20/15 1459 01/21/15 0650 01/22/15 0135 01/23/15 0625 01/24/15 0549  WBC 7.4 6.7 8.1 8.2 7.5  NEUTROABS 3.7 3.7  --   --   --   HGB 15.8* 14.0 13.0 13.1 13.0  HCT 48.1* 42.7 38.5 39.1 39.6  MCV 88.7 87.5 86.3 86.1 87.0  PLT 375 353 317 349 379     Recent Labs Lab 01/20/15 1702 01/21/15 1523 01/21/15 1830 01/23/15 1122 01/23/15 1600 01/23/15 2228  CKTOTAL 223  --   --   --   --   --   TROPONINI  --  5.64* 5.37* 2.77* 3.17* 2.54*     Recent Labs Lab 01/23/15 1205 01/23/15 1648 01/23/15 2212 01/24/15 0801 01/24/15 1148  GLUCAP 117* 109* 106* 97 112*    Iron Studies:  Recent Labs Lab 01/24/15 0549  IRON 73  TIBC NOT CALCULATED  FERRITIN 416*   Medications: . sodium  chloride 20 mL/hr at 01/24/15 1044   . antiseptic oral rinse  7 mL Mouth Rinse BID  . aspirin EC  81 mg Oral Daily  . atorvastatin  20 mg Oral QHS  . carvedilol  6.25 mg Oral BID WC  . famotidine  20 mg Oral Daily  . hydrALAZINE  50 mg Oral TID  . insulin aspart  0-5 Units Subcutaneous QHS  . insulin aspart  0-9 Units Subcutaneous TID WC  . isosorbide mononitrate  30 mg Oral Daily  . loperamide  2 mg Oral BID  . mometasone-formoterol  2 puff Inhalation BID  . saccharomyces boulardii  250 mg Oral BID  . sodium bicarbonate  650 mg Oral TID  . sodium chloride  3 mL Intravenous Q12H  . Warfarin - Pharmacist Dosing Inpatient   Does not apply q1800    Background: 69yo BF with a background of chronic diastolic CHF, HTN, HLD, DM, COPD and known CKD4. Followed by Dr Joelyn Oms for her CKD 4 sec DM/HTN +/- sarcoidosis. Scr over the past year has ranged from upper 2's to low 3's.She has had 2 AVF placed that have failed with plans to place AVG when closer to needing HD.  Admitted 01/20/15 with N/V/D and GI sx not fully understood per GI (3 neg CT's past 4 weeks, poss cirrhosis 5/16 CT, CDiff neg)   Scr on admission  was 2.45, then 2.9 so we were asked to see.  Hosp course complicated by finding of apical LV thrombus due to presumed recent previously unrecognized anteroapical MI, new acute systolic heart failure (EF 30-35%), mod reduced RV systolic functioin and severe pulmonary hypertension.  She is undergoing anticoagulation.    Assessment/Recommendations 1. AKI on CKD 4 sec DM/HTN +/- sarcoidosis. Her Scr of 2.9 was in range of where she has been for the last several months, but has bumped up fairly dramatically in the past 24 hours to 3.7 for unclear reasons to me. No UOP at all being recorded but says she is making urine.  No offensive medications. Wonder if her biventricular systolic dysfunction playing some role, though appears compensated at this time, volume seems OK. For completeness will get a renal US, repeat a UA (initial just protein from DM, 0-2 RBC's).  2. CKD-BMD. Checking PTH. Phos is fine 3. NSTEMI - for Myoview when stable 4. NSVT - on low dose BB 5. LV thrombus on heparin->coumadin 6. Biventricular systolic heart failure 7. Diarrhea - per GI 8. HTN BP well controlled and no hypotension 9. DM 10. Metabolic acidosis - Na bicarb. Goal CO2 20-22   Jamal Maes, MD [facility] [phone] pager 01/24/2015, 12:12 PM

## 2015-01-24 NOTE — Progress Notes (Addendum)
SUBJECTIVE:   Still with some diarrhea  OBJECTIVE:   Vitals:   Filed Vitals:   01/23/15 2253 01/24/15 0608 01/24/15 0743 01/24/15 0827  BP: 140/73 115/48  150/64  Pulse:  79  77  Temp:  98.1 F (36.7 C)    TempSrc:  Oral    Resp:  18    Height:      Weight:  195 lb 15.8 oz (88.9 kg)    SpO2:  99% 98%    I&O's:   Intake/Output Summary (Last 24 hours) at 01/24/15 1118 Last data filed at 01/23/15 2240  Gross per 24 hour  Intake    120 ml  Output      0 ml  Net    120 ml   TELEMETRY: Reviewed telemetry pt in NSR with frequent PAC's     PHYSICAL EXAM General: Well developed, well nourished, in no acute distress Head: Eyes PERRLA, No xanthomas.   Normal cephalic and atramatic  Lungs:   Clear bilaterally to auscultation and percussion. Heart:   HRRR S1 S2 Pulses are 2+ & equal. Abdomen: Bowel sounds are positive, abdomen soft and non-tender without masses  Extremities:   No clubbing, cyanosis or edema.  DP +1 Neuro: Alert and oriented X 3. Psych:  Good affect, responds appropriately   LABS: Basic Metabolic Panel:  Recent Labs  01/22/15 0135 01/22/15 1205 01/24/15 0549  NA 134* 134* 133*  K 3.0* 3.6 3.9  CL 106 107 105  CO2 18* 15* 18*  GLUCOSE 144* 127* 93  BUN 18 18 22*  CREATININE 2.78* 2.93* 3.73*  CALCIUM 7.5* 7.6* 8.5*  MG 1.9 1.8  --   PHOS  --   --  4.6   Liver Function Tests:  Recent Labs  01/22/15 0135 01/24/15 0549  AST 35  --   ALT 12*  --   ALKPHOS 249*  --   BILITOT 0.6  --   PROT 4.2*  --   ALBUMIN 1.6* 1.6*   No results for input(s): LIPASE, AMYLASE in the last 72 hours. CBC:  Recent Labs  01/23/15 0625 01/24/15 0549  WBC 8.2 7.5  HGB 13.1 13.0  HCT 39.1 39.6  MCV 86.1 87.0  PLT 349 379   Cardiac Enzymes:  Recent Labs  01/23/15 1122 01/23/15 1600 01/23/15 2228  TROPONINI 2.77* 3.17* 2.54*   BNP: Invalid input(s): POCBNP D-Dimer: No results for input(s): DDIMER in the last 72 hours. Hemoglobin A1C: No  results for input(s): HGBA1C in the last 72 hours. Fasting Lipid Panel: No results for input(s): CHOL, HDL, LDLCALC, TRIG, CHOLHDL, LDLDIRECT in the last 72 hours. Thyroid Function Tests:  Recent Labs  01/22/15 0135  TSH 0.523   Anemia Panel:  Recent Labs  01/24/15 0549  VITAMINB12 2326*  FOLATE 9.4  FERRITIN 416*  TIBC NOT CALCULATED  IRON 73  RETICCTPCT 2.2   Coag Panel:   Lab Results  Component Value Date   INR 3.71* 01/24/2015   INR 2.85* 01/23/2015   INR 1.49 01/22/2015    RADIOLOGY: Ct Abdomen Pelvis Wo Contrast  01/20/2015   CLINICAL DATA:  69 year old female with 3-4 week history of nausea, vomiting, diarrhea, weakness and abdominal pain while eating. History of trauma from a fall today.  EXAM: CT ABDOMEN AND PELVIS WITHOUT CONTRAST  TECHNIQUE: Multidetector CT imaging of the abdomen and pelvis was performed following the standard protocol without IV contrast.  COMPARISON:  CT of the abdomen and pelvis 12/21/2014.  FINDINGS: Lower chest: Mild  cylindrical bronchiectasis, thickening of the peribronchovascular interstitium and architectural distortion in the lower lobes of the lungs bilaterally, which could be post infectious, or could be related to recurrent aspiration. Mild cardiomegaly. Atherosclerotic calcifications in the left main and left anterior descending coronary artery.  Hepatobiliary: Status post cholecystectomy. Irregular area of low attenuation and calcification in the posterior aspect of the right lobe of the liver is similar to remote prior studies dating back to at least 08/25/2013, favored to be related to sequela of remote trauma. No new hepatic lesions.  Pancreas: No definite pancreatic mass or surrounding inflammatory changes on today's noncontrast CT examination.  Spleen: Status post splenectomy. Multiple small splenules noted in the left side of the abdomen.  Adrenals/Urinary Tract: The unenhanced appearance of the adrenal glands bilaterally and the right  kidney is normal. Mild scarring along the lateral aspect of the upper pole of the left kidney, likely related to prior splenectomy. Left kidney is otherwise normal in appearance. No hydroureteronephrosis. Urinary bladder is normal in appearance.  Stomach/Bowel: The unenhanced appearance of the stomach is normal. No pathologic dilatation of small bowel or colon. A few scattered colonic diverticulae are noted, without surrounding inflammatory changes to suggest an acute diverticulitis at this time.  Vascular/Lymphatic: Moderate atherosclerosis throughout the abdominal and pelvic vasculature, without definite aneurysm. No lymphadenopathy noted in the abdomen or pelvis on today's noncontrast CT examination.  Reproductive: Status post hysterectomy.  Ovaries are atrophic.  Other: No significant volume of ascites.  No pneumoperitoneum.  Musculoskeletal: There are no aggressive appearing lytic or blastic lesions noted in the visualized portions of the skeleton.  IMPRESSION: 1. No acute findings in the abdomen or pelvis on today's noncontrast CT examination to account for the patient's symptoms. 2. The appearance of the lower lobes of the lungs could suggest post infectious scarring, but could also be seen in the setting of recurrent aspiration. Clinical correlation is suggested. 3. Mild cardiomegaly. 4. Atherosclerosis, including left anterior descending coronary artery disease. Please note that although the presence of coronary artery calcium documents the presence of coronary artery disease, the severity of this disease and any potential stenosis cannot be assessed on this non-gated CT examination. Assessment for potential risk factor modification, dietary therapy or pharmacologic therapy may be warranted, if clinically indicated. 5. Status post splenectomy. Multiple splenules are noted throughout the left side of the abdomen. 6. Status post cholecystectomy. 7. Additional incidental findings, as above.   Electronically  Signed   By: Vinnie Langton M.D.   On: 01/20/2015 19:38   Nm Gastric Emptying  01/02/2015   CLINICAL DATA:  Severe abdominal pain with nausea and vomiting. Abdominal bloating.  EXAM: NUCLEAR MEDICINE GASTRIC EMPTYING SCAN  TECHNIQUE: After oral ingestion of radiolabeled meal, sequential abdominal images were obtained for 4 hours. Percentage of activity emptying the stomach was calculated at 1 hour, 2 hour, 3 hour, and 4 hours.  RADIOPHARMACEUTICALS:  2.0 mCi Technetium-32m labeled sulfur colloid orally cooked with one egg with 8 oz of ginger ale.  COMPARISON:  None.  FINDINGS: Expected location of the stomach in the left upper quadrant. Ingested meal empties the stomach gradually over the course of the study.  40.3% emptied at 1 hr ( normal >= 10%)  76.85% emptied at 2 hr ( normal >= 40%)  93.18% emptied at 3 hr ( normal >= 70%)  98.25% emptied at 4 hr ( normal >= 90%)  IMPRESSION: Normal gastric emptying study.   Electronically Signed   By: Lorriane Shire M.D.  On: 01/02/2015 15:04   Dg Abd Acute W/chest  12/31/2014   CLINICAL DATA:  Nausea, vomiting and diarrhea for 1 week. Diverticulitis diagnosed approximately 2 weeks ago  EXAM: DG ABDOMEN ACUTE W/ 1V CHEST  COMPARISON:  Radiographs and CT 12/21/2014.  FINDINGS: Borderline cardiomegaly and vascular congestion are again noted, similar to the prior study. There is no confluent airspace opacity or significant pleural effusion.  The bowel gas pattern appears normal. There is no free intraperitoneal air or suspicious abdominal calcification. Scattered vascular calcifications are noted. There are surgical clips in the upper abdomen bilaterally. Mild lumbar spine degenerative changes are stable.  IMPRESSION: 1. Stable radiographs of the chest and abdomen. No acute abdominal findings identified. 2. Stable borderline cardiomegaly and vascular congestion.   Electronically Signed   By: Richardean Sale M.D.   On: 12/31/2014 09:01    ASSESSMENT AND PLAN  69 y/o  female with h/o chronic diastolic CHF, HTN, HLD, DM, COPD, and CKD, admitted for abdominal pain and weakness. Also found to have new reduction in LV systolic function and a LV thrombus.   1. Biventricular Systolic Dysfunction:  - Newly reduced LV systolic function with EF of 30-35%, was 50-55% on previous echo in 2014. Also with moderate RV systolic dysfunction with moderately elevated PA pressure of 62 mm Hg.  - She denies CP and no worsening dyspnea beyond her baseline. - Her most recent EKG was 12/17/14 which showed RBBB and LPFB (new compared to prior EKGs).  - Continue heparin GTT, LA nitrate, statin, hydralazine.  Increase coreg to 6.25mg  BID. - BNP of 1056, euvolemic on exam.  - She is not a candidate for ACE/ARB therapy given her CKD.  - Monitor volume status. Strict I/Os, daily weights and low sodium diet.   2. LV Thrombus:  - visualized on 2D echo. Medium-sized, 2.2 cm (L) x 1.2 cm (W). In the setting of reduced LV systolic function. Her Hgb is stable. Given her CKD, IV heparin bridge to warfarin followed by pharmacy. INR is therapeutic today and now off Heparin.  She will need repeat echo on medical therapy in 2 months and if EF improved and no thrombus then could possibly come of coumadin.    4. NSTEMI - Trop 5.64 -->5.37. High risk to progress to ESRD needing HD with cath so will plan Myoview once stable to evaluate for any large areas of ischemia. She does have coronary artery calcifications on Chest CT.   5. NSVT - Asymptomatic. Continue to monitor. Keep K+ >4. continue low dose BB  6. Abdominal Pain: - per GI  7. CKD stage IV - per primary  8. Hypokalemia - as above   Sueanne Margarita, MD  01/24/2015  11:18 AM

## 2015-01-24 NOTE — Progress Notes (Addendum)
ANTICOAGULATION / Antibiotic CONSULT NOTE - Follow Up Consult  Pharmacy Consult for Coumadin  Indication: LV thrombus  Labs:  Recent Labs  01/22/15 0135 01/22/15 1205 01/23/15 0625 01/23/15 1122 01/23/15 1600 01/23/15 2228 01/24/15 0549  HGB 13.0  --  13.1  --   --   --  13.0  HCT 38.5  --  39.1  --   --   --  39.6  PLT 317  --  349  --   --   --  379  LABPROT 18.1*  --  29.4*  --   --   --  35.9*  INR 1.49  --  2.85*  --   --   --  3.71*  HEPARINUNFRC 0.66 0.54 0.35  --   --   --  0.43  CREATININE 2.78* 2.93*  --   --   --   --  3.73*  TROPONINI  --   --   --  2.77* 3.17* 2.54*  --     Assessment/Plan:  69yo female therapeutic continues on heparin and Coumadin for LV thrombus INR now up to 3.71 due to diarrhea and Flagyl  Plan: DC heparin and labs No Coumadin today Follow up AM INR  Thank you Anette Guarneri, PharmD   01/24/2015,9:38 AM

## 2015-01-24 NOTE — Progress Notes (Signed)
[facility] PROGRESS NOTE  [name] File [id number]:[id number] DOB: 1946/02/29 DOA: 01/20/2015 PCP: Antony Blackbird, MD  Assessment/Plan: nausea, vomiting, diarrhea and abd pain: with inability to keep things down -patient has been PPI for the last 2-3 weeks w/o significant improvement; she has no half any endoscopic procedure in the last 5 years. -gi pathogen panel ordered and pending -due to elevated lactic acid, elevated procalcitonin and low grade temp; started on sepsis order with vanc, zosyn and flagyl. patient no longer septic in appearance and no clear source of infection identified. Antibiotic has been discontinue. Patient's symptoms could be explained by NSTEMI and severe dehydration. -continue PRN antiemetics, GI cocktail and pepcid. Now that she is on anticoagulation for the thrombosis in her heart is going to be an increased risk for bleeding. Given her history of reflux and ongoing abdominal pain despite maximum therapy with PPIs, I think that she will benefit off on endoscopy. GI is on board and will follow their recommendations. Will check FOBT. -if C. 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 -Renal service has been consulted. Patient has been started on bicarbonate tablets. Will follow recommendations  chronic resp failure due to COPD: will continue oxygen supplementation- wears 2 L at home -no wheezing -will continue home bronchodilators regimen  diabetes with nephropathy: continue SSI -holding oral hypoglycemic agents  metabolic acidosis -will start bicarb -most likely associated with renal failure and ongoing diarrhea -Renal service has been consulted and will follow their recommendations. -patient if ended having a cath might get push to HD needs  Code Status: Partial (no intubation) Family Communication: family at bedside Disposition Plan:    Consultants:  GI ([facility])  Cardiology   Renal service  Procedures:  See below for x-ray reports   2-D echo Systolic function was moderately to severely reduced. The estimated ejection fraction was in the range of 30% to 35%. Wall motion was normal; there were no regional wall motion abnormalities. Doppler parameters are consistent with abnormal left ventricular relaxation (grade 1 diastolic dysfunction). Acoustic contrast opacification revealed a medium-sized, 2.2 cm (L) x 1.2 cm (W), apicalthrombusassociated with a hypokinetic segment. - Mitral valve: There was mild regurgitation. - Right ventricle: Systolic function was moderately reduced. - Pulmonary arteries: Systolic pressure was moderately increased. PA peak pressure: 62 mm Hg (S). - Pericardium, extracardiac: A trivial pericardial effusion was identified posterior to the heart.  Antibiotics:  Vanc 6/21>>6/23  Zosyn 6/21>>6/23  Flagyl stop on 6/22  HPI/Subjective: Tolerating food but still with nausea  Objective: Filed Vitals:   01/24/15 0827  BP: 150/64  Pulse: 77  Temp:   Resp:     Intake/Output Summary (Last 24 hours) at 01/24/15 1311 Last data filed at 01/23/15 2240  Gross per 24 hour  Intake    120  ml  Output      0 ml  Net    120 ml   Filed Weights   01/22/15 0524 01/23/15 0657 01/24/15 0608  Weight: 89.8 kg (197 lb 15.6 oz) 90.9 kg (200 lb 6.4 oz) 88.9 kg (195 lb 15.8 oz)    Exam:   General:  NAD  Cardiovascular: S1 and S2, no rubs or gallops, no JVD  Respiratory: good air movement, no wheezing, [place] in place and good O2 sat on 2L  Abdomen: soft, diffusely tender to palpation (but mainly mid abd), no distentsion, no guarding, positive BS  Musculoskeletal: trace edema bilaterally, no cyanosis   Data Reviewed: Basic Metabolic Panel:  Recent Labs Lab 01/20/15 1459 01/21/15 0020 01/21/15 0650 01/21/15 1523 01/22/15 0135 01/22/15 1205 01/24/15 0549  NA 140 137  --   --  134* 134* 133*  K 3.1* 3.7  --   --  3.0* 3.6 3.9  CL 108 111  --   --  106 107 105  CO2 22 18*  --   --  18* 15* 18*  GLUCOSE 128* 95  --   --  144* 127* 93  BUN 16 15  --   --  18 18 22*  CREATININE 2.45* 2.05*  --   --  2.78* 2.93* 3.73*  CALCIUM 8.3* 7.6*  --   --  7.5* 7.6* 8.5*  MG  --   --  0.9* 1.4* 1.9 1.8  --   PHOS  --   --   --   --   --   --  4.6   Liver Function Tests:  Recent Labs Lab 01/20/15 1459 01/21/15 0020 01/22/15 0135 01/24/15 0549  AST 38 59* 35  --   ALT 11* 11* 12*  --   ALKPHOS 304* 257* 249*  --   BILITOT 0.6 0.7 0.6  --   PROT 5.6* 4.5* 4.2*  --   ALBUMIN 2.1* 1.8* 1.6* 1.6*    Recent Labs Lab 01/20/15 1459  LIPASE 14*   CBC:  Recent Labs Lab 01/20/15 1459 01/21/15 0650 01/22/15 0135 01/23/15 0625 01/24/15 0549  WBC 7.4 6.7 8.1 8.2 7.5  NEUTROABS 3.7 3.7  --   --   --   HGB 15.8* 14.0 13.0 13.1 13.0  HCT 48.1* 42.7 38.5 39.1 39.6  MCV 88.7 87.5 86.3 86.1 87.0  PLT 375 353 317 349 379   Cardiac Enzymes:  Recent Labs Lab 01/20/15 1702 01/21/15 1523 01/21/15 1830 01/23/15 1122 01/23/15 1600 01/23/15 2228  CKTOTAL 223  --   --   --   --   --   TROPONINI  --  5.64* 5.37* 2.77* 3.17* 2.54*   BNP (last 3 results)  Recent Labs   09/02/14 0510 09/03/14 0428 01/21/15 1830  BNP 1159.9* 1107.3* 1056.8*    ProBNP (last 3 results)  Recent Labs  04/26/14 2358 06/24/14 0945  PROBNP 3725.0* 324.0*    CBG:  Recent Labs Lab 01/23/15 1205 01/23/15 1648 01/23/15 2212 01/24/15 0801 01/24/15 1148  GLUCAP 117* 109* 106* 97 112*    Studies: No results found.  Scheduled Meds: . antiseptic oral rinse  7 mL Mouth Rinse BID  . aspirin EC  81 mg Oral Daily  . atorvastatin  20 mg Oral QHS  . carvedilol  6.25 mg Oral BID WC  . famotidine  20 mg Oral Daily  . hydrALAZINE  50 mg Oral TID  . insulin aspart  0-5 Units Subcutaneous QHS  . insulin aspart  0-9 Units Subcutaneous TID WC  . isosorbide mononitrate  30 mg Oral Daily  . loperamide  2 mg Oral BID  . mometasone-formoterol  2 puff Inhalation BID  . saccharomyces boulardii  250 mg Oral BID  . sodium bicarbonate  650 mg Oral TID  . sodium chloride  3 mL Intravenous Q12H  . Warfarin - Pharmacist Dosing Inpatient   Does not apply q1800   Continuous Infusions: . sodium chloride 20 mL/hr at 01/24/15 1044    Principal Problem:   Lactic acidosis Active Problems:   Obesity   Essential hypertension   GERD   Psoriasis   NAUSEA AND VOMITING   Chronic diastolic heart failure   Diabetes mellitus with renal manifestation   Anemia, iron deficiency   Diarrhea   Sepsis   Protein-calorie malnutrition, severe   NSTEMI (non-ST elevated myocardial infarction)   Mural thrombus of cardiac apex   Acute renal failure superimposed on stage 4 chronic kidney disease   Hypokalemia   DCM (dilated cardiomyopathy)    Time spent: 30 minutes    Randon Somera  [facility] Pager [phone]. If 7PM-7AM, please contact night-coverage at [email or web address], password [facility] 01/24/2015, 1:11 PM  LOS: 4 days

## 2015-01-24 NOTE — Discharge Instructions (Signed)
Information on my medicine - Coumadin   (Warfarin)  This medication education was reviewed with me or my healthcare representative as part of my discharge preparation.  The pharmacist that spoke with me during my hospital stay was:  Tad Moore, [facility]  Why was Coumadin prescribed for you? Coumadin was prescribed for you because you have a blood clot or a medical condition that can cause an increased risk of forming blood clots. Blood clots can cause serious health problems by blocking the flow of blood to the heart, lung, or brain. Coumadin can prevent harmful blood clots from forming. As a reminder your indication for Coumadin is:   Blood Clotting Disorder  What test will check on my response to Coumadin? While on Coumadin (warfarin) you will need to have an INR test regularly to ensure that your dose is keeping you in the desired range. The INR (international normalized ratio) number is calculated from the result of the laboratory test called prothrombin time (PT).  If an INR APPOINTMENT HAS NOT ALREADY BEEN MADE FOR YOU please schedule an appointment to have this lab work done by your health care provider within 7 days. Your INR goal is usually a number between:  2 to 3 or your provider may give you a more narrow range like 2-2.5.  Ask your health care provider during an office visit what your goal INR is.  What  do you need to  know  About  COUMADIN? Take Coumadin (warfarin) exactly as prescribed by your healthcare provider about the same time each day.  DO NOT stop taking without talking to the doctor who prescribed the medication.  Stopping without other blood clot prevention medication to take the place of Coumadin may increase your risk of developing a new clot or stroke.  Get refills before you run out.  What do you do if you miss a dose? If you miss a dose, take it as soon as you remember on the same day then continue your regularly scheduled regimen the next day.  Do not take two  doses of Coumadin at the same time.  Important Safety Information A possible side effect of Coumadin (Warfarin) is an increased risk of bleeding. You should call your healthcare provider right away if you experience any of the following: ? Bleeding from an injury or your nose that does not stop. ? Unusual colored urine (red or dark brown) or unusual colored stools (red or black). ? Unusual bruising for unknown reasons. ? A serious fall or if you hit your head (even if there is no bleeding).  Some foods or medicines interact with Coumadin (warfarin) and might alter your response to warfarin. To help avoid this: ? Eat a balanced diet, maintaining a consistent amount of Vitamin K. ? Notify your provider about major diet changes you plan to make. ? Avoid alcohol or limit your intake to 1 drink for women and 2 drinks for men per day. (1 drink is 5 oz. wine, 12 oz. beer, or 1.5 oz. liquor.)  Make sure that ANY health care provider who prescribes medication for you knows that you are taking Coumadin (warfarin).  Also make sure the healthcare provider who is monitoring your Coumadin knows when you have started a new medication including herbals and non-prescription products.  Coumadin (Warfarin)  Major Drug Interactions  Increased Warfarin Effect Decreased Warfarin Effect  Alcohol (large quantities) Antibiotics (esp. Septra/Bactrim, Flagyl, Cipro) Amiodarone (Cordarone) Aspirin (ASA) Cimetidine (Tagamet) Megestrol (Megace) NSAIDs (ibuprofen, naproxen, etc.) Piroxicam (  Feldene) °Propafenone (Rythmol SR) °Propranolol (Inderal) °Isoniazid (INH) °Posaconazole (Noxafil) Barbiturates (Phenobarbital) °Carbamazepine (Tegretol) °Chlordiazepoxide (Librium) °Cholestyramine (Questran) °Griseofulvin °Oral Contraceptives °Rifampin °Sucralfate (Carafate) °Vitamin K  ° °Coumadin® (Warfarin) Major Herbal Interactions  °Increased Warfarin Effect Decreased Warfarin Effect  °Garlic °Ginseng °Ginkgo biloba Coenzyme  Q10 °Green tea °St. John’s wort   ° °Coumadin® (Warfarin) FOOD Interactions  °Eat a consistent number of servings per week of foods HIGH in Vitamin K °(1 serving = ½ cup)  °Collards (cooked, or boiled & drained) °Kale (cooked, or boiled & drained) °Mustard greens (cooked, or boiled & drained) °Parsley *serving size only = ¼ cup °Spinach (cooked, or boiled & drained) °Swiss chard (cooked, or boiled & drained) °Turnip greens (cooked, or boiled & drained)  °Eat a consistent number of servings per week of foods MEDIUM-HIGH in Vitamin K °(1 serving = 1 cup)  °Asparagus (cooked, or boiled & drained) °Broccoli (cooked, boiled & drained, or raw & chopped) °Brussel sprouts (cooked, or boiled & drained) *serving size only = ½ cup °Lettuce, raw (green leaf, endive, romaine) °Spinach, raw °Turnip greens, raw & chopped  ° °These websites have more information on Coumadin (warfarin):  www.coumadin.com; °www.ahrq.gov/consumer/coumadin.htm; ° ° °

## 2015-01-25 LAB — CBC
HEMATOCRIT: 37.6 % (ref 36.0–46.0)
Hemoglobin: 12.3 g/dL (ref 12.0–15.0)
MCH: 28.5 pg (ref 26.0–34.0)
MCHC: 32.7 g/dL (ref 30.0–36.0)
MCV: 87 fL (ref 78.0–100.0)
PLATELETS: 351 10*3/uL (ref 150–400)
RBC: 4.32 MIL/uL (ref 3.87–5.11)
RDW: 16.6 % — ABNORMAL HIGH (ref 11.5–15.5)
WBC: 6.8 10*3/uL (ref 4.0–10.5)

## 2015-01-25 LAB — RENAL FUNCTION PANEL
ANION GAP: 8 (ref 5–15)
Albumin: 1.5 g/dL — ABNORMAL LOW (ref 3.5–5.0)
BUN: 25 mg/dL — ABNORMAL HIGH (ref 6–20)
CHLORIDE: 107 mmol/L (ref 101–111)
CO2: 18 mmol/L — ABNORMAL LOW (ref 22–32)
CREATININE: 3.89 mg/dL — AB (ref 0.44–1.00)
Calcium: 8.5 mg/dL — ABNORMAL LOW (ref 8.9–10.3)
GFR calc Af Amer: 13 mL/min — ABNORMAL LOW (ref >60)
GFR calc non Af Amer: 11 mL/min — ABNORMAL LOW (ref >60)
Glucose, Bld: 90 mg/dL (ref 65–99)
POTASSIUM: 3.7 mmol/L (ref 3.5–5.1)
Phosphorus: 4.8 mg/dL — ABNORMAL HIGH (ref 2.5–4.6)
Sodium: 133 mmol/L — ABNORMAL LOW (ref 135–145)

## 2015-01-25 LAB — GLUCOSE, CAPILLARY
GLUCOSE-CAPILLARY: 112 mg/dL — AB (ref 65–99)
Glucose-Capillary: 93 mg/dL (ref 65–99)
Glucose-Capillary: 135 mg/dL — ABNORMAL HIGH (ref 65–99)
Glucose-Capillary: 140 mg/dL — ABNORMAL HIGH (ref 65–99)

## 2015-01-25 LAB — PARATHYROID HORMONE, INTACT (NO CA): PTH: 83 pg/mL — AB (ref 15–65)

## 2015-01-25 LAB — PROTIME-INR
INR: 2.68 — ABNORMAL HIGH (ref 0.00–1.49)
PROTHROMBIN TIME: 28.1 s — AB (ref 11.6–15.2)

## 2015-01-25 MED ORDER — WARFARIN SODIUM 1 MG PO TABS
1.0000 mg | ORAL_TABLET | Freq: Once | ORAL | Status: AC
  Administered 2015-01-25: 1 mg via ORAL
  Filled 2015-01-25: qty 1

## 2015-01-25 MED ORDER — SODIUM BICARBONATE 650 MG PO TABS
1300.0000 mg | ORAL_TABLET | Freq: Two times a day (BID) | ORAL | Status: DC
  Administered 2015-01-25 – 2015-01-29 (×9): 1300 mg via ORAL
  Filled 2015-01-25 (×10): qty 2

## 2015-01-25 NOTE — Progress Notes (Signed)
[facility] PROGRESS NOTE  [name] File [id number] DOB: [birth year] DOA: 01/20/2015 PCP: Antony Blackbird, MD  Assessment/Plan: nausea, vomiting, diarrhea and abd pain: with inability to keep things down -patient has been PPI for the last 2-3 weeks w/o significant improvement; she has no half any endoscopic procedure in the last 5 years. -gi pathogen panel negative -due to elevated lactic acid, elevated procalcitonin and low grade temp; started on sepsis order with vanc, zosyn and flagyl. patient no longer septic in appearance and no clear source of infection identified. Antibiotic hves been discontinued. Patient's symptoms could be explained by NSTEMI and severe dehydration. 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 -Renal service has been consulted. Patient has been started on bicarbonate tablets. Will follow recommendations  chronic resp failure due to COPD: will continue oxygen supplementation- wears 2 L at home -no wheezing -will continue home bronchodilators regimen  diabetes with nephropathy: continue SSI -holding oral hypoglycemic agents  metabolic acidosis -will start bicarb -most likely associated with renal failure and ongoing diarrhea -Renal service has been consulted  and will follow their recommendations. -patient if ended having a cath might get push to HD needs  Code Status: Partial (no intubation) Family Communication: family at bedside 6/24 Disposition Plan:    Consultants:  GI ([facility])  Cardiology   Renal service  Procedures:  See below for x-ray reports   2-D echo Systolic function was moderately to severely reduced. The estimated ejection fraction was in the range of 30% to 35%. Wall motion was normal; there were no regional wall motion abnormalities. Doppler parameters are consistent with abnormal left ventricular relaxation (grade 1 diastolic dysfunction). Acoustic contrast opacification revealed a medium-sized, 2.2 cm (L) x 1.2 cm (W), apicalthrombusassociated with a hypokinetic segment. - Mitral valve: There was mild regurgitation. - Right ventricle: Systolic function was moderately reduced. - Pulmonary arteries: Systolic pressure was moderately increased. PA peak pressure: 62 mm Hg (S). - Pericardium, extracardiac: A trivial pericardial effusion was identified posterior to the heart.  Antibiotics:  Vanc 6/21>>6/23  Zosyn 6/21>>6/23  Flagyl stop on 6/22  HPI/Subjective: Up at sink taking a "wash up" Per patient 1 watery BM today  Objective: Filed Vitals:   01/25/15 0540  BP: 114/49  Pulse: 77  Temp: 98.1 F (36.7 C)  Resp: 20    Intake/Output Summary (Last 24 hours) at 01/25/15 1119 Last data filed at 01/24/15 2200  Gross per 24 hour  Intake    360 ml  Output      0 ml  Net    360 ml   Filed Weights   01/23/15 0657 01/24/15 0608 01/25/15 0453  Weight: 90.9 kg (200 lb 6.4 oz) 88.9 kg (195 lb 15.8 oz) 93.4 kg (205 lb 14.6 oz)    Exam:   General:  NAD  Cardiovascular: S1 and S2, no rubs or gallops,  no JVD  Respiratory: good air movement, no wheezing, [place] in place and good O2 sat on 2L  Abdomen: soft, +BS  Musculoskeletal: trace edema bilaterally, no cyanosis   Data  Reviewed: Basic Metabolic Panel:  Recent Labs Lab 01/21/15 0020 01/21/15 0650 01/21/15 1523 01/22/15 0135 01/22/15 1205 01/24/15 0549 01/25/15 0638  NA 137  --   --  134* 134* 133* 133*  K 3.7  --   --  3.0* 3.6 3.9 3.7  CL 111  --   --  106 107 105 107  CO2 18*  --   --  18* 15* 18* 18*  GLUCOSE 95  --   --  144* 127* 93 90  BUN 15  --   --  18 18 22* 25*  CREATININE 2.05*  --   --  2.78* 2.93* 3.73* 3.89*  CALCIUM 7.6*  --   --  7.5* 7.6* 8.5* 8.5*  MG  --  0.9* 1.4* 1.9 1.8  --   --   PHOS  --   --   --   --   --  4.6 4.8*   Liver Function Tests:  Recent Labs Lab 01/20/15 1459 01/21/15 0020 01/22/15 0135 01/24/15 0549 01/25/15 0638  AST 38 59* 35  --   --   ALT 11* 11* 12*  --   --   ALKPHOS 304* 257* 249*  --   --   BILITOT 0.6 0.7 0.6  --   --   PROT 5.6* 4.5* 4.2*  --   --   ALBUMIN 2.1* 1.8* 1.6* 1.6* 1.5*    Recent Labs Lab 01/20/15 1459  LIPASE 14*   CBC:  Recent Labs Lab 01/20/15 1459 01/21/15 0650 01/22/15 0135 01/23/15 0625 01/24/15 0549 01/25/15 0638  WBC 7.4 6.7 8.1 8.2 7.5 6.8  NEUTROABS 3.7 3.7  --   --   --   --   HGB 15.8* 14.0 13.0 13.1 13.0 12.3  HCT 48.1* 42.7 38.5 39.1 39.6 37.6  MCV 88.7 87.5 86.3 86.1 87.0 87.0  PLT 375 353 317 349 379 351   Cardiac Enzymes:  Recent Labs Lab 01/20/15 1702 01/21/15 1523 01/21/15 1830 01/23/15 1122 01/23/15 1600 01/23/15 2228  CKTOTAL 223  --   --   --   --   --   TROPONINI  --  5.64* 5.37* 2.77* 3.17* 2.54*   BNP (last 3 results)  Recent Labs  09/02/14 0510 09/03/14 0428 01/21/15 1830  BNP 1159.9* 1107.3* 1056.8*    ProBNP (last 3 results)  Recent Labs  04/26/14 2358 06/24/14 0945  PROBNP 3725.0* 324.0*    CBG:  Recent Labs Lab 01/24/15 0801 01/24/15 1148 01/24/15 1747 01/24/15 2150 01/25/15 0806  GLUCAP 97 112* 95 118* 93    Studies: US Renal  01/24/2015   CLINICAL DATA:  Acute renal failure, history essential benign hypertension, coronary artery  disease, COPD, type II diabetes mellitus, sarcoidosis, stage IV chronic kidney disease  EXAM: RENAL / URINARY TRACT ULTRASOUND COMPLETE  COMPARISON:  CT abdomen and pelvis 01/20/2015  FINDINGS: Degradation of image quality secondary to body habitus.  Right Kidney:  Length: 9.2 cm. Grossly normal cortical thickness and echogenicity. No definite mass, hydronephrosis or shadowing calcification.  Left Kidney:  Length: 10.0 cm. Grossly normal cortical thickness and echogenicity. No definite mass, hydronephrosis or shadowing calcification.  Bladder:  Partially distended, grossly unremarkable.  IMPRESSION: No acute abnormalities.   Electronically Signed   By: Lavonia Dana M.D.   On:  01/24/2015 16:54    Scheduled Meds: . antiseptic oral rinse  7 mL Mouth Rinse BID  . aspirin EC  81 mg Oral Daily  . atorvastatin  20 mg Oral QHS  . carvedilol  6.25 mg Oral BID WC  . famotidine  20 mg Oral Daily  . hydrALAZINE  50 mg Oral TID  . insulin aspart  0-5 Units Subcutaneous QHS  . insulin aspart  0-9 Units Subcutaneous TID WC  . isosorbide mononitrate  30 mg Oral Daily  . loperamide  2 mg Oral BID  . mometasone-formoterol  2 puff Inhalation BID  . saccharomyces boulardii  250 mg Oral BID  . sodium bicarbonate  650 mg Oral TID  . sodium chloride  3 mL Intravenous Q12H  . warfarin  1 mg Oral ONCE-1800  . Warfarin - Pharmacist Dosing Inpatient   Does not apply q1800   Continuous Infusions: . sodium chloride 20 mL/hr at 01/24/15 1044    Principal Problem:   Lactic acidosis Active Problems:   Obesity   Essential hypertension   GERD   Psoriasis   NAUSEA AND VOMITING   Chronic diastolic heart failure   Diabetes mellitus with renal manifestation   Anemia, iron deficiency   Diarrhea   Sepsis   Protein-calorie malnutrition, severe   NSTEMI (non-ST elevated myocardial infarction)   Mural thrombus of cardiac apex   Acute renal failure superimposed on stage 4 chronic kidney disease   Hypokalemia   DCM  (dilated cardiomyopathy)    Time spent: 25 minutes    Macio Kissoon  [facility] Pager [phone]. If 7PM-7AM, please contact night-coverage at [email or web address], password [facility] 01/25/2015, 11:19 AM  LOS: 5 days

## 2015-01-25 NOTE — Progress Notes (Signed)
ANTICOAGULATION CONSULT NOTE - Follow Up Consult  Pharmacy Consult for Coumadin  Indication: LV thrombus  Labs:  Recent Labs  01/22/15 1205  01/23/15 0625 01/23/15 1122 01/23/15 1600 01/23/15 2228 01/24/15 0549 01/25/15 0638  HGB  --   < > 13.1  --   --   --  13.0 12.3  HCT  --   --  39.1  --   --   --  39.6 37.6  PLT  --   --  349  --   --   --  379 351  LABPROT  --   --  29.4*  --   --   --  35.9* 28.1*  INR  --   --  2.85*  --   --   --  3.71* 2.68*  HEPARINUNFRC 0.54  --  0.35  --   --   --  0.43  --   CREATININE 2.93*  --   --   --   --   --  3.73* 3.89*  TROPONINI  --   --   --  2.77* 3.17* 2.54*  --   --   < > = values in this interval not displayed.  Assessment/Plan:  69yo female therapeutic continues on Coumadin for LV thrombus INR today is therapeutic at 2.68  No bleeding reported  Plan: Coumadin 1 mg po today Consider restart heparin as patient only received 4 days therapy Follow up AM INR  Thanks for allowing pharmacy to be a part of this patient's care.  Excell Seltzer, PharmD Clinical Pharmacist, [phone] 01/25/2015,10:21 AM

## 2015-01-25 NOTE — Progress Notes (Signed)
   Patient Name: [name] Date of Encounter: 01/25/2015, 9:23 AM    Subjective  1-2 stools since yesterday No vomiting   Objective  BP 114/49 mmHg  Pulse 77  Temp(Src) 98.1 F (36.7 C) (Oral)  Resp 20  Ht 5' 2.4" (1.585 m)  Wt 205 lb 14.6 oz (93.4 kg)  BMI 37.18 kg/m2  SpO2 96% NAD    Assessment and Plan  Recent protracted nausea, vomiting and diarrhea - resolved  Stool pathogen panel neg at least 1 x and C diff neg   Has multiple comorbidities including CHF and DM which likely contributed to her GI sxs if did not cause them.  Since she appears resolved and no bleedingI cannot make a good case for endoscopic evaluation at this time. Continue current RX.  Call if ?  Outpatient GI f/u Dr. Henrene Pastor prn   Gatha Mayer, MD, [facility] G[facility] [phone] (pager) 01/25/2015 9:23 AM

## 2015-01-25 NOTE — Progress Notes (Signed)
[facility] Rounding Note  Subjective:  States she feels MUCH better today Breathing stable (no SOB at rest, wears O2 with exertion) Has had only 1 diarrheal stool so far today She says making urine but STILL nothing is being recorded!!!  Objective Vital signs in last 24 hours: Filed Vitals:   01/24/15 2233 01/25/15 0453 01/25/15 0540 01/25/15 0731  BP: 112/62  114/49   Pulse: 72  77   Temp:   98.1 F (36.7 C)   TempSrc:   Oral   Resp:   20   Height:      Weight:  93.4 kg (205 lb 14.6 oz)    SpO2:   98% 96%   Weight change: 4.5 kg (9 lb 14.7 oz)  Intake/Output Summary (Last 24 hours) at 01/25/15 1159 Last data filed at 01/24/15 2200  Gross per 24 hour  Intake    360 ml  Output      0 ml  Net    360 ml   PHYSICAL EXAM: BP 114/49 mmHg  Pulse 77  Temp(Src) 98.1 F (36.7 C) (Oral)  Resp 20  Ht 5' 2.4" (1.585 m)  Wt 93.4 kg (205 lb 14.6 oz)  BMI 37.18 kg/m2  SpO2 96% Very pleasant older AAF. NAD Sitting up in the chair NECK:No JVD LUNGS:Clear without crackles or wheezes CARDIAC:Regular S1S2 normal No S3 or S4 ABD:+ BS  EXT: Trace pre-tibial pitting edema. Chronic venous stasis changes/skin darkening.  Failed AVF Left lower and upper arm NEURO: Ox3 no asterixis   Labs: Basic Metabolic Panel:  Recent Labs Lab 01/20/15 1459 01/21/15 0020 01/22/15 0135 01/22/15 1205 01/24/15 0549 01/25/15 0638  NA 140 137 134* 134* 133* 133*  K 3.1* 3.7 3.0* 3.6 3.9 3.7  CL 108 111 106 107 105 107  CO2 22 18* 18* 15* 18* 18*  GLUCOSE 128* 95 144* 127* 93 90  BUN 16 15 18 18  22* 25*  CREATININE 2.45* 2.05* 2.78* 2.93* 3.73* 3.89*  CALCIUM 8.3* 7.6* 7.5* 7.6* 8.5* 8.5*  PHOS  --   --   --   --  4.6 4.8*     Recent Labs Lab 01/20/15 1459 01/21/15 0020 01/22/15 0135 01/24/15 0549 01/25/15 0638  AST 38 59* 35  --   --   ALT 11* 11* 12*  --   --   ALKPHOS 304* 257* 249*  --   --   BILITOT 0.6 0.7 0.6  --   --   PROT 5.6* 4.5* 4.2*  --   --    ALBUMIN 2.1* 1.8* 1.6* 1.6* 1.5*    Recent Labs Lab 01/20/15 1459  LIPASE 14*    Recent Labs Lab 01/22/15 0135  AMMONIA 38*     Recent Labs Lab 01/20/15 1459 01/21/15 0650 01/22/15 0135 01/23/15 0625 01/24/15 0549 01/25/15 0638  WBC 7.4 6.7 8.1 8.2 7.5 6.8  NEUTROABS 3.7 3.7  --   --   --   --   HGB 15.8* 14.0 13.0 13.1 13.0 12.3  HCT 48.1* 42.7 38.5 39.1 39.6 37.6  MCV 88.7 87.5 86.3 86.1 87.0 87.0  PLT 375 353 317 349 379 351     Recent Labs Lab 01/20/15 1702 01/21/15 1523 01/21/15 1830 01/23/15 1122 01/23/15 1600 01/23/15 2228  CKTOTAL 223  --   --   --   --   --   TROPONINI  --  5.64* 5.37* 2.77* 3.17* 2.54*     Recent Labs Lab 01/24/15 0801 01/24/15 1148 01/24/15  1747 01/24/15 2150 01/25/15 0806  GLUCAP 97 112* 95 118* 93    Iron Studies:   Recent Labs Lab 01/24/15 0549  IRON 73  TIBC NOT CALCULATED  FERRITIN 416*   Results for MAURA, BRAATEN (MRN [id number]) as of 01/25/2015 12:02  Ref. Range 01/24/2015 05:49  PTH Latest Ref Range: 15-65 pg/mL 83 (H)   Renal Ultrasound Right kidney: Length: 9.2 cm. Grossly normal cortical thickness and echogenicity. No definite mass, hydronephrosis or shadowing calcification. Left Kidney:  Length: 10.0 cm. Grossly normal cortical thickness and echogenicity. No definite mass, hydronephrosis or shadowing calcification. Bladder:  Partially distended, grossly unremarkable   Medications: . sodium chloride 20 mL/hr at 01/24/15 1044   . antiseptic oral rinse  7 mL Mouth Rinse BID  . aspirin EC  81 mg Oral Daily  . atorvastatin  20 mg Oral QHS  . carvedilol  6.25 mg Oral BID WC  . famotidine  20 mg Oral Daily  . hydrALAZINE  50 mg Oral TID  . insulin aspart  0-5 Units Subcutaneous QHS  . insulin aspart  0-9 Units Subcutaneous TID WC  . isosorbide mononitrate  30 mg Oral Daily  . loperamide  2 mg Oral BID  . mometasone-formoterol  2 puff Inhalation BID  . saccharomyces boulardii  250 mg Oral  BID  . sodium bicarbonate  650 mg Oral TID  . sodium chloride  3 mL Intravenous Q12H  . warfarin  1 mg Oral ONCE-1800  . Warfarin - Pharmacist Dosing Inpatient   Does not apply q1800    Background: 69yo BF with a background of chronic diastolic CHF, HTN, HLD, DM, COPD and known CKD4. Followed by Dr Joelyn Oms for her CKD 4 sec DM/HTN +/- sarcoidosis. Scr over the past year has ranged from upper 2's to low 3's.She has had 2 AVF placed that have failed with plans to place AVG when closer to needing HD.  Admitted 01/20/15 with N/V/D and GI sx not fully understood per GI (3 neg CT's past 4 weeks, poss cirrhosis 5/16 CT, CDiff neg)   Scr on admission was 2.45, then 2.9 so we were asked to see.  Hosp course complicated by finding of apical LV thrombus due to presumed recent previously unrecognized anteroapical MI, new acute systolic heart failure (EF 30-35%), mod reduced RV systolic function and severe pulmonary hypertension.  She is undergoing anticoagulation.    Assessment/Recommendations 1. AKI on CKD 4 sec DM/HTN +/- sarcoidosis. Her Scr of 2.9 was in range of where she has been for the last several months, but  bumped up fairly dramatically  to 3.7 yesterday for unclear reasons, and is up today to 3.89 but rate of rise is clearly slower. No UOP at all being recorded but says she is making urine.  No offensive medications. Suspect  her biventricular systolic dysfunction playing some role, though appears compensated at this time, volume seems OK. She has been off her usual diuretics the entire admission. [place] no hydro but bladder partially distended. Check bladder scan, If over 400 cc I and O cath.   2. CKD-BMD. PTH 83. Calcitriol not needed at this level. Phos is fine 3. NSTEMI - for Myoview tomorrow 4. NSVT - on low dose BB 5. LV thrombus on heparin->coumadin. To have repeat ECHO in 2 months and if EF improved and no thrombus could possible come off coumadin per cardiology 6. Biventricular systolic  heart failure 7. Diarrhea - per GI 8. HTN BP well controlled and no hypotension 9.  DM 10. Metabolic acidosis - Na bicarb. Goal CO2 20-22   Jamal Maes, MD [facility] [phone] pager 01/25/2015, 11:59 AM

## 2015-01-25 NOTE — Progress Notes (Addendum)
SUBJECTIVE:  Feels much better today.  Only 1 diarrheal stool in the last day  OBJECTIVE:   Vitals:   Filed Vitals:   01/24/15 2233 01/25/15 0453 01/25/15 0540 01/25/15 0731  BP: 112/62  114/49   Pulse: 72  77   Temp:   98.1 F (36.7 C)   TempSrc:   Oral   Resp:   20   Height:      Weight:  205 lb 14.6 oz (93.4 kg)    SpO2:   98% 96%   I&O's:   Intake/Output Summary (Last 24 hours) at 01/25/15 1109 Last data filed at 01/24/15 2200  Gross per 24 hour  Intake    360 ml  Output      0 ml  Net    360 ml   TELEMETRY: Reviewed telemetry pt in NSR with PAC's and short 4 beat run of NSVT     PHYSICAL EXAM General: Well developed, well nourished, in no acute distress Head: Eyes PERRLA, No xanthomas.   Normal cephalic and atramatic  Lungs:   Clear bilaterally to auscultation and percussion. Heart:   HRRR S1 S2 Pulses are 2+ & equal. Abdomen: Bowel sounds are positive, abdomen soft and non-tender without masses  Extremities:   No clubbing, cyanosis or edema.  DP +1 Neuro: Alert and oriented X 3. Psych:  Good affect, responds appropriately   LABS: Basic Metabolic Panel:  Recent Labs  01/22/15 1205 01/24/15 0549 01/25/15 0638  NA 134* 133* 133*  K 3.6 3.9 3.7  CL 107 105 107  CO2 15* 18* 18*  GLUCOSE 127* 93 90  BUN 18 22* 25*  CREATININE 2.93* 3.73* 3.89*  CALCIUM 7.6* 8.5* 8.5*  MG 1.8  --   --   PHOS  --  4.6 4.8*   Liver Function Tests:  Recent Labs  01/24/15 0549 01/25/15 0638  ALBUMIN 1.6* 1.5*   No results for input(s): LIPASE, AMYLASE in the last 72 hours. CBC:  Recent Labs  01/24/15 0549 01/25/15 0638  WBC 7.5 6.8  HGB 13.0 12.3  HCT 39.6 37.6  MCV 87.0 87.0  PLT 379 351   Cardiac Enzymes:  Recent Labs  01/23/15 1122 01/23/15 1600 01/23/15 2228  TROPONINI 2.77* 3.17* 2.54*   BNP: Invalid input(s): POCBNP D-Dimer: No results for input(s): DDIMER in the last 72 hours. Hemoglobin A1C: No results for input(s): HGBA1C in the  last 72 hours. Fasting Lipid Panel: No results for input(s): CHOL, HDL, LDLCALC, TRIG, CHOLHDL, LDLDIRECT in the last 72 hours. Thyroid Function Tests: No results for input(s): TSH, T4TOTAL, T3FREE, THYROIDAB in the last 72 hours.  Invalid input(s): FREET3 Anemia Panel:  Recent Labs  01/24/15 0549  VITAMINB12 2326*  FOLATE 9.4  FERRITIN 416*  TIBC NOT CALCULATED  IRON 73  RETICCTPCT 2.2   Coag Panel:   Lab Results  Component Value Date   INR 2.68* 01/25/2015   INR 3.71* 01/24/2015   INR 2.85* 01/23/2015    RADIOLOGY: Ct Abdomen Pelvis Wo Contrast  01/20/2015   CLINICAL DATA:  69 year old female with 3-4 week history of nausea, vomiting, diarrhea, weakness and abdominal pain while eating. History of trauma from a fall today.  EXAM: CT ABDOMEN AND PELVIS WITHOUT CONTRAST  TECHNIQUE: Multidetector CT imaging of the abdomen and pelvis was performed following the standard protocol without IV contrast.  COMPARISON:  CT of the abdomen and pelvis 12/21/2014.  FINDINGS: Lower chest: Mild cylindrical bronchiectasis, thickening of the peribronchovascular interstitium and architectural distortion  in the lower lobes of the lungs bilaterally, which could be post infectious, or could be related to recurrent aspiration. Mild cardiomegaly. Atherosclerotic calcifications in the left main and left anterior descending coronary artery.  Hepatobiliary: Status post cholecystectomy. Irregular area of low attenuation and calcification in the posterior aspect of the right lobe of the liver is similar to remote prior studies dating back to at least 08/25/2013, favored to be related to sequela of remote trauma. No new hepatic lesions.  Pancreas: No definite pancreatic mass or surrounding inflammatory changes on today's noncontrast CT examination.  Spleen: Status post splenectomy. Multiple small splenules noted in the left side of the abdomen.  Adrenals/Urinary Tract: The unenhanced appearance of the adrenal glands  bilaterally and the right kidney is normal. Mild scarring along the lateral aspect of the upper pole of the left kidney, likely related to prior splenectomy. Left kidney is otherwise normal in appearance. No hydroureteronephrosis. Urinary bladder is normal in appearance.  Stomach/Bowel: The unenhanced appearance of the stomach is normal. No pathologic dilatation of small bowel or colon. A few scattered colonic diverticulae are noted, without surrounding inflammatory changes to suggest an acute diverticulitis at this time.  Vascular/Lymphatic: Moderate atherosclerosis throughout the abdominal and pelvic vasculature, without definite aneurysm. No lymphadenopathy noted in the abdomen or pelvis on today's noncontrast CT examination.  Reproductive: Status post hysterectomy.  Ovaries are atrophic.  Other: No significant volume of ascites.  No pneumoperitoneum.  Musculoskeletal: There are no aggressive appearing lytic or blastic lesions noted in the visualized portions of the skeleton.  IMPRESSION: 1. No acute findings in the abdomen or pelvis on today's noncontrast CT examination to account for the patient's symptoms. 2. The appearance of the lower lobes of the lungs could suggest post infectious scarring, but could also be seen in the setting of recurrent aspiration. Clinical correlation is suggested. 3. Mild cardiomegaly. 4. Atherosclerosis, including left anterior descending coronary artery disease. Please note that although the presence of coronary artery calcium documents the presence of coronary artery disease, the severity of this disease and any potential stenosis cannot be assessed on this non-gated CT examination. Assessment for potential risk factor modification, dietary therapy or pharmacologic therapy may be warranted, if clinically indicated. 5. Status post splenectomy. Multiple splenules are noted throughout the left side of the abdomen. 6. Status post cholecystectomy. 7. Additional incidental findings, as  above.   Electronically Signed   By: Vinnie Langton M.D.   On: 01/20/2015 19:38   Nm Gastric Emptying  01/02/2015   CLINICAL DATA:  Severe abdominal pain with nausea and vomiting. Abdominal bloating.  EXAM: NUCLEAR MEDICINE GASTRIC EMPTYING SCAN  TECHNIQUE: After oral ingestion of radiolabeled meal, sequential abdominal images were obtained for 4 hours. Percentage of activity emptying the stomach was calculated at 1 hour, 2 hour, 3 hour, and 4 hours.  RADIOPHARMACEUTICALS:  2.0 mCi Technetium-53m labeled sulfur colloid orally cooked with one egg with 8 oz of ginger ale.  COMPARISON:  None.  FINDINGS: Expected location of the stomach in the left upper quadrant. Ingested meal empties the stomach gradually over the course of the study.  40.3% emptied at 1 hr ( normal >= 10%)  76.85% emptied at 2 hr ( normal >= 40%)  93.18% emptied at 3 hr ( normal >= 70%)  98.25% emptied at 4 hr ( normal >= 90%)  IMPRESSION: Normal gastric emptying study.   Electronically Signed   By: Lorriane Shire M.D.   On: 01/02/2015 15:04   US Renal  01/24/2015   CLINICAL DATA:  Acute renal failure, history essential benign hypertension, coronary artery disease, COPD, type II diabetes mellitus, sarcoidosis, stage IV chronic kidney disease  EXAM: RENAL / URINARY TRACT ULTRASOUND COMPLETE  COMPARISON:  CT abdomen and pelvis 01/20/2015  FINDINGS: Degradation of image quality secondary to body habitus.  Right Kidney:  Length: 9.2 cm. Grossly normal cortical thickness and echogenicity. No definite mass, hydronephrosis or shadowing calcification.  Left Kidney:  Length: 10.0 cm. Grossly normal cortical thickness and echogenicity. No definite mass, hydronephrosis or shadowing calcification.  Bladder:  Partially distended, grossly unremarkable.  IMPRESSION: No acute abnormalities.   Electronically Signed   By: Lavonia Dana M.D.   On: 01/24/2015 16:54   Dg Abd Acute W/chest  12/31/2014   CLINICAL DATA:  Nausea, vomiting and diarrhea for 1 week.  Diverticulitis diagnosed approximately 2 weeks ago  EXAM: DG ABDOMEN ACUTE W/ 1V CHEST  COMPARISON:  Radiographs and CT 12/21/2014.  FINDINGS: Borderline cardiomegaly and vascular congestion are again noted, similar to the prior study. There is no confluent airspace opacity or significant pleural effusion.  The bowel gas pattern appears normal. There is no free intraperitoneal air or suspicious abdominal calcification. Scattered vascular calcifications are noted. There are surgical clips in the upper abdomen bilaterally. Mild lumbar spine degenerative changes are stable.  IMPRESSION: 1. Stable radiographs of the chest and abdomen. No acute abdominal findings identified. 2. Stable borderline cardiomegaly and vascular congestion.   Electronically Signed   By: Richardean Sale M.D.   On: 12/31/2014 09:01   ASSESSMENT AND PLAN  69 y/o female with h/o chronic diastolic CHF, HTN, HLD, DM, COPD, and CKD, admitted for abdominal pain and weakness. Also found to have new reduction in LV systolic function and a LV thrombus.   1. Biventricular Systolic Dysfunction:  - Newly reduced LV systolic function with EF of 30-35%, was 50-55% on previous echo in 2014. Also with moderate RV systolic dysfunction with moderately elevated PA pressure of 62 mm Hg.  - She denies CP and no worsening dyspnea beyond her baseline. - Her most recent EKG was 12/17/14 which showed RBBB and LPFB (new compared to prior EKGs).  - Continue heparin GTT, LA nitrate, statin, hydralazine/BB. - BNP of 1056, euvolemic on exam.  - She is not a candidate for ACE/ARB therapy given her CKD.  - Monitor volume status. Strict I/Os, daily weights and low sodium diet.   2. LV Thrombus:  - visualized on 2D echo. Medium-sized, 2.2 cm (L) x 1.2 cm (W). In the setting of reduced LV systolic function. Her Hgb is stable. Given her CKD, IV heparin bridge to warfarin followed by pharmacy. INR is therapeutic today and now off Heparin. She will need  repeat echo on medical therapy in 2 months and if EF improved and no thrombus then could possibly come of coumadin.   4. NSTEMI - Trop 5.64 -->5.37. High risk to progress to ESRD needing HD with cath so will plan Myoview tomrrow to evaluate for any large areas of ischemia. She does have coronary artery calcifications on Chest CT.   5. NSVT - Asymptomatic. Continue to monitor. Keep K+ >4. continue low dose BB  6. Abdominal Pain: - per GI  7. CKD stage IV - per primary  8. Hypokalemia - as above    Sueanne Margarita, MD  01/25/2015  11:09 AM

## 2015-01-26 ENCOUNTER — Inpatient Hospital Stay (HOSPITAL_COMMUNITY): Payer: Medicare Other

## 2015-01-26 DIAGNOSIS — R079 Chest pain, unspecified: Secondary | ICD-10-CM

## 2015-01-26 DIAGNOSIS — N179 Acute kidney failure, unspecified: Secondary | ICD-10-CM

## 2015-01-26 DIAGNOSIS — N184 Chronic kidney disease, stage 4 (severe): Secondary | ICD-10-CM

## 2015-01-26 LAB — NM MYOCAR MULTI W/SPECT W/WALL MOTION / EF
CHL CUP MPHR: 154 {beats}/min
CHL CUP NUCLEAR SDS: 3
CHL CUP NUCLEAR SRS: 4
CHL CUP NUCLEAR SSS: 7
CHL CUP RESTING HR STRESS: 66 {beats}/min
CSEPEW: 1 METS
CSEPHR: 47 %
CSEPPHR: 73 {beats}/min
Exercise duration (min): 0 min
Exercise duration (sec): 0 s
LV dias vol: 114 mL
LVSYSVOL: 60 mL
RATE: 0.48
TID: 0.93

## 2015-01-26 LAB — RENAL FUNCTION PANEL
ALBUMIN: 1.5 g/dL — AB (ref 3.5–5.0)
Anion gap: 8 (ref 5–15)
BUN: 28 mg/dL — ABNORMAL HIGH (ref 6–20)
CALCIUM: 8.2 mg/dL — AB (ref 8.9–10.3)
CO2: 19 mmol/L — AB (ref 22–32)
Chloride: 105 mmol/L (ref 101–111)
Creatinine, Ser: 4.02 mg/dL — ABNORMAL HIGH (ref 0.44–1.00)
GFR calc non Af Amer: 11 mL/min — ABNORMAL LOW (ref >60)
GFR, EST AFRICAN AMERICAN: 12 mL/min — AB (ref >60)
GLUCOSE: 108 mg/dL — AB (ref 65–99)
POTASSIUM: 3.8 mmol/L (ref 3.5–5.1)
Phosphorus: 5 mg/dL — ABNORMAL HIGH (ref 2.5–4.6)
SODIUM: 132 mmol/L — AB (ref 135–145)

## 2015-01-26 LAB — GLUCOSE, CAPILLARY
GLUCOSE-CAPILLARY: 137 mg/dL — AB (ref 65–99)
GLUCOSE-CAPILLARY: 116 mg/dL — AB (ref 65–99)
Glucose-Capillary: 100 mg/dL — ABNORMAL HIGH (ref 65–99)
Glucose-Capillary: 136 mg/dL — ABNORMAL HIGH (ref 65–99)

## 2015-01-26 LAB — CULTURE, BLOOD (ROUTINE X 2)
CULTURE: NO GROWTH
Culture: NO GROWTH

## 2015-01-26 LAB — CBC
HCT: 36.5 % (ref 36.0–46.0)
HEMOGLOBIN: 12.1 g/dL (ref 12.0–15.0)
MCH: 28.7 pg (ref 26.0–34.0)
MCHC: 33.2 g/dL (ref 30.0–36.0)
MCV: 86.5 fL (ref 78.0–100.0)
PLATELETS: 379 10*3/uL (ref 150–400)
RBC: 4.22 MIL/uL (ref 3.87–5.11)
RDW: 16.5 % — ABNORMAL HIGH (ref 11.5–15.5)
WBC: 7.2 10*3/uL (ref 4.0–10.5)

## 2015-01-26 LAB — URINALYSIS W MICROSCOPIC (NOT AT ARMC)
Bilirubin Urine: NEGATIVE
Glucose, UA: 100 mg/dL — AB
HGB URINE DIPSTICK: NEGATIVE
KETONES UR: NEGATIVE mg/dL
Nitrite: NEGATIVE
PH: 5.5 (ref 5.0–8.0)
Specific Gravity, Urine: 1.012 (ref 1.005–1.030)
Urobilinogen, UA: 0.2 mg/dL (ref 0.0–1.0)

## 2015-01-26 LAB — PROTIME-INR
INR: 2.28 — AB (ref 0.00–1.49)
Prothrombin Time: 24.9 seconds — ABNORMAL HIGH (ref 11.6–15.2)

## 2015-01-26 MED ORDER — REGADENOSON 0.4 MG/5ML IV SOLN
0.4000 mg | Freq: Once | INTRAVENOUS | Status: AC
  Administered 2015-01-26: 0.4 mg via INTRAVENOUS
  Filled 2015-01-26: qty 5

## 2015-01-26 MED ORDER — TECHNETIUM TC 99M SESTAMIBI - CARDIOLITE
30.0000 | Freq: Once | INTRAVENOUS | Status: AC | PRN
  Administered 2015-01-26: 11:00:00 30 via INTRAVENOUS

## 2015-01-26 MED ORDER — REGADENOSON 0.4 MG/5ML IV SOLN
INTRAVENOUS | Status: AC
  Administered 2015-01-26: 0.4 mg via INTRAVENOUS
  Filled 2015-01-26: qty 5

## 2015-01-26 MED ORDER — TECHNETIUM TC 99M SESTAMIBI GENERIC - CARDIOLITE
10.0000 | Freq: Once | INTRAVENOUS | Status: AC | PRN
  Administered 2015-01-26: 10 via INTRAVENOUS

## 2015-01-26 MED ORDER — WARFARIN SODIUM 2.5 MG PO TABS
2.5000 mg | ORAL_TABLET | Freq: Once | ORAL | Status: AC
  Administered 2015-01-26: 2.5 mg via ORAL
  Filled 2015-01-26: qty 1

## 2015-01-26 NOTE — Progress Notes (Signed)
[facility] Rounding Note  Subjective:  Myoview today Finally seeing some UOP recorded and is emptying bladder fine Had bladder scans yesterday  1st shift 43 ml post voiding, 2nd shift 189 cc on scam then voided 250 So IS making urine - just not that much Creatinine still inching up Weight inching up  (no diuretics since admission)  Objective Vital signs in last 24 hours: Filed Vitals:   01/26/15 1054 01/26/15 1059 01/26/15 1101 01/26/15 1102  BP: 135/55 128/51 130/54 136/55  Pulse:      Temp:      TempSrc:      Resp:      Height:      Weight:      SpO2:       Weight change: 0.9 kg (1 lb 15.8 oz)  Intake/Output Summary (Last 24 hours) at 01/26/15 1132 Last data filed at 01/25/15 2200  Gross per 24 hour  Intake    240 ml  Output    375 ml  Net   -135 ml   PHYSICAL EXAM: BP 136/55 mmHg  Pulse 73  Temp(Src) 98.1 F (36.7 C) (Oral)  Resp 16  Ht 5' 2.4" (1.585 m)  Wt 94.3 kg (207 lb 14.3 oz)  BMI 37.54 kg/m2  SpO2 99%   Very pleasant older AAF (69 years old). NAD NECK:No JVD discernable (beefy neck) LUNGS:Clear without crackles or wheezes CARDIAC:Regular S1S2 normal No S3 or S4 ABD:+ BS  EXT: Trace pre-tibial pitting edema.  Not changed.  Chronic venous stasis changes/skin darkening.  Failed AVF Left lower and upper arm NEURO: Ox3 no asterixis   Labs: Basic Metabolic Panel:  Recent Labs Lab 01/20/15 1459 01/21/15 0020 01/22/15 0135 01/22/15 1205 01/24/15 0549 01/25/15 0638 01/26/15 0320  NA 140 137 134* 134* 133* 133* 132*  K 3.1* 3.7 3.0* 3.6 3.9 3.7 3.8  CL 108 111 106 107 105 107 105  CO2 22 18* 18* 15* 18* 18* 19*  GLUCOSE 128* 95 144* 127* 93 90 108*  BUN 16 15 18 18  22* 25* 28*  CREATININE 2.45* 2.05* 2.78* 2.93* 3.73* 3.89* 4.02*  CALCIUM 8.3* 7.6* 7.5* 7.6* 8.5* 8.5* 8.2*  PHOS  --   --   --   --  4.6 4.8* 5.0*     Recent Labs Lab 01/20/15 1459 01/21/15 0020 01/22/15 0135 01/24/15 0549 01/25/15 0638 01/26/15 0320  AST 38 59*  35  --   --   --   ALT 11* 11* 12*  --   --   --   ALKPHOS 304* 257* 249*  --   --   --   BILITOT 0.6 0.7 0.6  --   --   --   PROT 5.6* 4.5* 4.2*  --   --   --   ALBUMIN 2.1* 1.8* 1.6* 1.6* 1.5* 1.5*    Recent Labs Lab 01/20/15 1459  LIPASE 14*    Recent Labs Lab 01/22/15 0135  AMMONIA 38*     Recent Labs Lab 01/20/15 1459 01/21/15 0650  01/23/15 0625 01/24/15 0549 01/25/15 0638 01/26/15 0320  WBC 7.4 6.7  < > 8.2 7.5 6.8 7.2  NEUTROABS 3.7 3.7  --   --   --   --   --   HGB 15.8* 14.0  < > 13.1 13.0 12.3 12.1  HCT 48.1* 42.7  < > 39.1 39.6 37.6 36.5  MCV 88.7 87.5  < > 86.1 87.0 87.0 86.5  PLT 375 353  < > 349 379 351 379  < > =  values in this interval not displayed.   Recent Labs Lab 01/20/15 1702 01/21/15 1523 01/21/15 1830 01/23/15 1122 01/23/15 1600 01/23/15 2228  CKTOTAL 223  --   --   --   --   --   TROPONINI  --  5.64* 5.37* 2.77* 3.17* 2.54*     Recent Labs Lab 01/25/15 0806 01/25/15 1158 01/25/15 1720 01/25/15 2237 01/26/15 0832  GLUCAP 93 112* 135* 140* 100*     Recent Labs Lab 01/24/15 0549  IRON 73  TIBC NOT CALCULATED  FERRITIN 416*   Results for KARESS, HARNER A (MRN [id number]) as of 01/25/2015 12:02  Ref. Range 01/24/2015 05:49  PTH Latest Ref Range: 15-65 pg/mL 83 (H)   Renal Ultrasound Right kidney: Length: 9.2 cm. Grossly normal cortical thickness and echogenicity. No definite mass, hydronephrosis or shadowing calcification. Left Kidney:  Length: 10.0 cm. Grossly normal cortical thickness and echogenicity. No definite mass, hydronephrosis or shadowing calcification. Bladder:  Partially distended, grossly unremarkable   Medications: . sodium chloride 20 mL/hr at 01/24/15 1044   . antiseptic oral rinse  7 mL Mouth Rinse BID  . aspirin EC  81 mg Oral Daily  . atorvastatin  20 mg Oral QHS  . carvedilol  6.25 mg Oral BID WC  . famotidine  20 mg Oral Daily  . hydrALAZINE  50 mg Oral TID  . insulin aspart  0-5 Units  Subcutaneous QHS  . insulin aspart  0-9 Units Subcutaneous TID WC  . isosorbide mononitrate  30 mg Oral Daily  . loperamide  2 mg Oral BID  . mometasone-formoterol  2 puff Inhalation BID  . saccharomyces boulardii  250 mg Oral BID  . sodium bicarbonate  1,300 mg Oral BID  . sodium chloride  3 mL Intravenous Q12H  . warfarin  2.5 mg Oral ONCE-1800  . Warfarin - Pharmacist Dosing Inpatient   Does not apply q1800    Background: 69yo BF with a background of chronic diastolic CHF, HTN, HLD, DM, COPD and known CKD4. Followed by Dr Joelyn Oms for her CKD 4 sec DM/HTN +/- sarcoidosis. Scr over the past year has ranged from upper 2's to low 3's.She has had 2 AVF's placed that have failed with plans to place AVG when closer to needing HD.  Admitted 01/20/15 with N/V/D and GI sx not fully understood per GI (3 neg CT's past 4 weeks, poss cirrhosis 5/16 CT, CDiff neg)   Scr on admission was 2.45, then 2.9 so we were asked to see.  Hosp course complicated by finding of apical LV thrombus due to presumed recent previously unrecognized anteroapical MI, new acute systolic heart failure (EF 30-35%), mod reduced RV systolic function and severe pulmonary hypertension.  She is undergoing anticoagulation.Creatinine has continued to rise in the absence of diuretics or any kidney unfriendly medications.   Assessment/Recommendations 1. AKI on CKD 4 sec DM/HTN +/- sarcoidosis. Her Scr of 2.9 (GFR ~18) was in range of where she has been for the last several months, but  bumped up fairly dramatically  to 3.7 on 6/24 and has continued to rise for unclear reasons, up to 4 today. UOP finally being recorded - certainly not large amounts (and has not been receiving any of her home diuretics since admission) but base on bladder scans is emptying her bladder fine.   No offensive medications. Suspect her biventricular systolic dysfunction playing some role, and with a baseline GFR of 18 prior to NSTEMI and marked decrease EF I think  is adequate explanation.  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 Calcitriol not needed at this level. Phos is fine 3. NSTEMI - getting Myoview 4. NSVT - on low dose BB 5. LV thrombus on heparin->coumadin. To have repeat ECHO in 2 months and if EF improved and no thrombus could possible come off coumadin per cardiology 6. Biventricular systolic heart failure 7. Diarrhea - per GI 8. HTN BP well controlled and no hypotension 9. DM 10. Metabolic acidosis - Na bicarb. Goal CO2 20-22. Slow imp.    Jamal Maes, MD [facility] [phone] pager 01/26/2015, 11:32 AM

## 2015-01-26 NOTE — Progress Notes (Signed)
Nuc study results:  Myocardial perfusion is normal. The study is normal. This is an intermediate risk study. LV cavity size is normal. Nuclear stress EF: 47%. The left ventricular ejection fraction is mildly decreased (45-54%). There is no prior study for comparison.1. No definite scintigraphic evidence of prior infarction or pharmacologically induced ischemia. 2. Mild global hypokinesia. 3. Left ventricular ejection fraction 47% 4. Intermediate-risk stress test findings*.   **No infarct/ischemia.  Attenuation reported.  No further ischemic eval.  CHF team to see in AM to consider RHC - measure pressures/output/index.  Discussed with patient this afternoon.  Murray Hodgkins, NP 01/26/2015, 4:46 PM

## 2015-01-26 NOTE — Progress Notes (Signed)
ANTICOAGULATION CONSULT NOTE - Follow Up Consult  Pharmacy Consult for Coumadin  Indication: LV thrombus  Labs:  Recent Labs  01/23/15 1122 01/23/15 1600 01/23/15 2228  01/24/15 0549 01/25/15 0638 01/26/15 0320  HGB  --   --   --   < > 13.0 12.3 12.1  HCT  --   --   --   --  39.6 37.6 36.5  PLT  --   --   --   --  379 351 379  LABPROT  --   --   --   --  35.9* 28.1* 24.9*  INR  --   --   --   --  3.71* 2.68* 2.28*  HEPARINUNFRC  --   --   --   --  0.43  --   --   CREATININE  --   --   --   --  3.73* 3.89* 4.02*  TROPONINI 2.77* 3.17* 2.54*  --   --   --   --   < > = values in this interval not displayed.  Assessment/Plan:  69yo female therapeutic continues on Coumadin for LV thrombus INR today is therapeutic at 2.28 and trending down  No bleeding reported  Plan: Coumadin 2.5 mg po today Consider restart heparin as patient only received 4 days therapy Follow up AM INR  Thanks for allowing pharmacy to be a part of this patient's care.  Excell Seltzer, PharmD Clinical Pharmacist, [phone] 01/26/2015,11:16 AM

## 2015-01-26 NOTE — Progress Notes (Signed)
[facility] PROGRESS NOTE  [name] File BHA:[id number] DOB: Aug 28, 1945 DOA: 01/20/2015 PCP: Antony Blackbird, MD  [name] is a 69 y.o. female with Past medical history of COPD, coronary artery disease, GERD, gout, obesity, chronic diastolic CHF, obstructive sleep apnea, type 2 diabetes mellitus, essential hypertension, chronic kidney disease. The patient is presenting with complaints of abdominal pain as well as nausea vomiting and diarrhea on ongoing since last 2 months progressively worsening leading to weakness and a fall.  Patient has been seen by GI: no further work up, cardiology: stress test 6/26 and nephrology:    Assessment/Plan: nausea, vomiting, diarrhea and abd pain: with inability to keep things down -patient has been PPI for the last 2-3 weeks w/o significant improvement; she has no half any endoscopic procedure in the last 5 years. -gi pathogen panel negative -due to elevated lactic acid, elevated procalcitonin and low grade temp; started on sepsis order with vanc, zosyn and flagyl. patient no longer septic in appearance and no clear source of infection identified. Antibiotic hves been discontinued. Patient's symptoms could be explained by NSTEMI and severe dehydration. -GI: Since she appears resolved and no bleeding cannot make a good case for endoscopic evaluation at this time  dehydration, hypokalemia and hypomagnesemia: -repleated  chronic CHF (combined disease): EF 30-35% and NSTEMI -troponin elevated -big decrease in EF, with hypokinesis and apical thrombus -cardiology consulted and on board; coumadin for 2 months and repeat echo -daily weights -strict intake and output -Not a candidate for ACE/ARB secondary to chronic kidney disease -Started on carvedilol, imdur and hydralazine -Myoview 6/26  essential HTN: -BP overall stable -will continue current meds  acute on CKD stage 4: will monitor Cr trend -If the patient ended up requiring to have a  catheterization  may end up on hemodialysis. -Renal service has been consulted. Patient has been started on bicarbonate tablets. Will follow recommendations  chronic resp failure due to COPD: will continue oxygen supplementation- wears 2 L at home -no wheezing -will continue home bronchodilators regimen  diabetes with nephropathy: continue SSI -holding oral hypoglycemic agents  metabolic acidosis -will start bicarb -most likely associated with renal failure and ongoing diarrhea -Renal service has been consulted and will follow their recommendations. -patient if ended having a cath might get push to HD needs  Code Status: Partial (no intubation) Family Communication: family at bedside 6/24 Disposition Plan:    Consultants:  GI ([facility])  Cardiology   Renal service  Procedures:  See below for x-ray reports   2-D echo Systolic function was moderately to severely reduced. The estimated ejection fraction was in the range of 30% to 35%. Wall motion was normal; there were no regional wall motion abnormalities. Doppler parameters are consistent with abnormal left ventricular relaxation (grade 1 diastolic dysfunction). Acoustic contrast opacification revealed a medium-sized, 2.2 cm (L) x 1.2 cm (W), apicalthrombusassociated with a hypokinetic segment. - Mitral valve: There was mild regurgitation. - Right ventricle: Systolic function was moderately reduced. - Pulmonary arteries: Systolic pressure was moderately increased. PA peak pressure: 62 mm Hg (S). - Pericardium, extracardiac: A trivial pericardial effusion was identified posterior to the heart.  Antibiotics:  Vanc 6/21>>6/23  Zosyn 6/21>>6/23  Flagyl stop on 6/22  HPI/Subjective: Feeling better today  Objective: Filed Vitals:   01/26/15 1102  BP: 136/55  Pulse:   Temp:   Resp:     Intake/Output Summary (Last 24 hours) at 01/26/15 1158 Last data filed at 01/25/15 2200  Gross per 24  hour  Intake    240 ml  Output    375 ml  Net   -135 ml   Filed Weights   01/24/15 0608 01/25/15 0453 01/26/15 0618  Weight: 88.9 kg (195 lb 15.8 oz) 93.4 kg (205 lb 14.6 oz) 94.3 kg (207 lb 14.3 oz)    Exam:   General:  NAD  Cardiovascular: S1 and S2, no rubs or gallops, no JVD  Respiratory: good air movement, no wheezing, [place] in place and good O2 sat on 2L  Abdomen: soft, +BS  Musculoskeletal: trace edema bilaterally, no cyanosis   Data Reviewed: Basic Metabolic Panel:  Recent Labs Lab 01/21/15 0650 01/21/15 1523 01/22/15 0135 01/22/15 1205 01/24/15 0549 01/25/15 0638 01/26/15 0320  NA  --   --  134* 134* 133* 133* 132*  K  --   --  3.0* 3.6 3.9 3.7 3.8  CL  --   --  106 107 105 107 105  CO2  --   --  18* 15* 18* 18* 19*  GLUCOSE  --   --  144* 127* 93 90 108*  BUN  --   --  18 18 22* 25* 28*  CREATININE  --   --  2.78* 2.93* 3.73* 3.89* 4.02*  CALCIUM  --   --  7.5* 7.6* 8.5* 8.5* 8.2*  MG 0.9* 1.4* 1.9 1.8  --   --   --   PHOS  --   --   --   --  4.6 4.8* 5.0*   Liver Function Tests:  Recent Labs Lab 01/20/15 1459 01/21/15 0020 01/22/15 0135 01/24/15 0549 01/25/15 0638 01/26/15 0320  AST 38 59* 35  --   --   --   ALT 11* 11* 12*  --   --   --   ALKPHOS 304* 257* 249*  --   --   --   BILITOT 0.6 0.7 0.6  --   --   --   PROT 5.6* 4.5* 4.2*  --   --   --   ALBUMIN 2.1* 1.8* 1.6* 1.6* 1.5* 1.5*    Recent Labs Lab 01/20/15 1459  LIPASE 14*   CBC:  Recent Labs Lab 01/20/15 1459 01/21/15 0650 01/22/15 0135 01/23/15 0625 01/24/15 0549 01/25/15 0638 01/26/15 0320  WBC 7.4 6.7 8.1 8.2 7.5 6.8 7.2  NEUTROABS 3.7 3.7  --   --   --   --   --   HGB 15.8* 14.0 13.0 13.1 13.0 12.3 12.1  HCT 48.1* 42.7 38.5 39.1 39.6 37.6 36.5  MCV 88.7 87.5 86.3 86.1 87.0 87.0 86.5  PLT 375 353 317 349 379 351 379   Cardiac Enzymes:  Recent Labs Lab 01/20/15 1702 01/21/15 1523 01/21/15 1830 01/23/15 1122 01/23/15 1600 01/23/15 2228  CKTOTAL 223  --    --   --   --   --   TROPONINI  --  5.64* 5.37* 2.77* 3.17* 2.54*   BNP (last 3 results)  Recent Labs  09/02/14 0510 09/03/14 0428 01/21/15 1830  BNP 1159.9* 1107.3* 1056.8*    ProBNP (last 3 results)  Recent Labs  04/26/14 2358 06/24/14 0945  PROBNP 3725.0* 324.0*    CBG:  Recent Labs Lab 01/25/15 0806 01/25/15 1158 01/25/15 1720 01/25/15 2237 01/26/15 0832  GLUCAP 93 112* 135* 140* 100*    Studies: US Renal  01/24/2015   CLINICAL DATA:  Acute renal failure, history essential benign hypertension, coronary artery disease, COPD, type II diabetes mellitus, sarcoidosis, stage IV chronic  kidney disease  EXAM: RENAL / URINARY TRACT ULTRASOUND COMPLETE  COMPARISON:  CT abdomen and pelvis 01/20/2015  FINDINGS: Degradation of image quality secondary to body habitus.  Right Kidney:  Length: 9.2 cm. Grossly normal cortical thickness and echogenicity. No definite mass, hydronephrosis or shadowing calcification.  Left Kidney:  Length: 10.0 cm. Grossly normal cortical thickness and echogenicity. No definite mass, hydronephrosis or shadowing calcification.  Bladder:  Partially distended, grossly unremarkable.  IMPRESSION: No acute abnormalities.   Electronically Signed   By: Lavonia Dana M.D.   On: 01/24/2015 16:54    Scheduled Meds: . antiseptic oral rinse  7 mL Mouth Rinse BID  . aspirin EC  81 mg Oral Daily  . atorvastatin  20 mg Oral QHS  . carvedilol  6.25 mg Oral BID WC  . famotidine  20 mg Oral Daily  . hydrALAZINE  50 mg Oral TID  . insulin aspart  0-5 Units Subcutaneous QHS  . insulin aspart  0-9 Units Subcutaneous TID WC  . isosorbide mononitrate  30 mg Oral Daily  . loperamide  2 mg Oral BID  . mometasone-formoterol  2 puff Inhalation BID  . saccharomyces boulardii  250 mg Oral BID  . sodium bicarbonate  1,300 mg Oral BID  . sodium chloride  3 mL Intravenous Q12H  . warfarin  2.5 mg Oral ONCE-1800  . Warfarin - Pharmacist Dosing Inpatient   Does not apply q1800    Continuous Infusions: . sodium chloride 20 mL/hr at 01/24/15 1044    Principal Problem:   Lactic acidosis Active Problems:   Obesity   Essential hypertension   GERD   Psoriasis   NAUSEA AND VOMITING   Chronic diastolic heart failure   Diabetes mellitus with renal manifestation   Anemia, iron deficiency   Diarrhea   Sepsis   Protein-calorie malnutrition, severe   NSTEMI (non-ST elevated myocardial infarction)   Mural thrombus of cardiac apex   Acute renal failure superimposed on stage 4 chronic kidney disease   Hypokalemia   DCM (dilated cardiomyopathy)    Time spent: 25 minutes    VANN, JESSICA  [facility] Pager [phone]. If 7PM-7AM, please contact night-coverage at [email or web address], password [facility] 01/26/2015, 11:58 AM  LOS: 6 days

## 2015-01-26 NOTE — Progress Notes (Signed)
Patient Name: [name] Date of Encounter: 01/26/2015   Principal Problem:   Lactic acidosis Active Problems:   Obesity   Essential hypertension   GERD   Psoriasis   NAUSEA AND VOMITING   Chronic diastolic heart failure   Diabetes mellitus with renal manifestation   Anemia, iron deficiency   Diarrhea   Sepsis   Protein-calorie malnutrition, severe   NSTEMI (non-ST elevated myocardial infarction)   Mural thrombus of cardiac apex   Acute renal failure superimposed on stage 4 chronic kidney disease   Hypokalemia   DCM (dilated cardiomyopathy)    SUBJECTIVE  No chest pain or sob.  For myoview this am.  CURRENT MEDS . antiseptic oral rinse  7 mL Mouth Rinse BID  . aspirin EC  81 mg Oral Daily  . atorvastatin  20 mg Oral QHS  . carvedilol  6.25 mg Oral BID WC  . famotidine  20 mg Oral Daily  . hydrALAZINE  50 mg Oral TID  . insulin aspart  0-5 Units Subcutaneous QHS  . insulin aspart  0-9 Units Subcutaneous TID WC  . isosorbide mononitrate  30 mg Oral Daily  . loperamide  2 mg Oral BID  . mometasone-formoterol  2 puff Inhalation BID  . saccharomyces boulardii  250 mg Oral BID  . sodium bicarbonate  1,300 mg Oral BID  . sodium chloride  3 mL Intravenous Q12H  . Warfarin - Pharmacist Dosing Inpatient   Does not apply q1800    OBJECTIVE  Filed Vitals:   01/25/15 2159 01/25/15 2328 01/26/15 0617 01/26/15 0618  BP: 129/57 119/59 123/56   Pulse: 71  73   Temp: 98.1 F (36.7 C)  98.1 F (36.7 C)   TempSrc: Oral  Oral   Resp: 18  16   Height:      Weight:    207 lb 14.3 oz (94.3 kg)  SpO2: 99%  99%     Intake/Output Summary (Last 24 hours) at 01/26/15 1050 Last data filed at 01/25/15 2200  Gross per 24 hour  Intake    240 ml  Output    375 ml  Net   -135 ml   Filed Weights   01/24/15 0608 01/25/15 0453 01/26/15 0618  Weight: 195 lb 15.8 oz (88.9 kg) 205 lb 14.6 oz (93.4 kg) 207 lb 14.3 oz (94.3 kg)    PHYSICAL EXAM  General: Pleasant,  NAD. Neuro: Alert and oriented X 3. Moves all extremities spontaneously. Psych: Normal affect. HEENT:  Normal  Neck: Supple without bruits.  Difficult to gauge jvp 2/2 girth. Lungs:  Resp regular and unlabored, CTA. Heart: RRR, distant, no s3, s4, or murmurs. Abdomen: Soft, non-tender, non-distended, BS + x 4.  Extremities: No clubbing, cyanosis.  Trace bilat LE edema. DP/PT/Radials 2+ and equal bilaterally.  Accessory Clinical Findings  CBC  Recent Labs  01/25/15 0638 01/26/15 0320  WBC 6.8 7.2  HGB 12.3 12.1  HCT 37.6 36.5  MCV 87.0 86.5  PLT 351 397   Basic Metabolic Panel  Recent Labs  01/25/15 0638 01/26/15 0320  NA 133* 132*  K 3.7 3.8  CL 107 105  CO2 18* 19*  GLUCOSE 90 108*  BUN 25* 28*  CREATININE 3.89* 4.02*  CALCIUM 8.5* 8.2*  PHOS 4.8* 5.0*   Liver Function Tests  Recent Labs  01/25/15 0638 01/26/15 0320  ALBUMIN 1.5* 1.5*   Cardiac Enzymes  Recent Labs  01/23/15 1122 01/23/15 1600 01/23/15 2228  TROPONINI 2.77* 3.17* 2.54*  Lab Results  Component Value Date   INR 2.28* 01/26/2015   INR 2.68* 01/25/2015   INR 3.71* 01/24/2015    TELE  Seen in nuc med - in sinus.  ASSESSMENT AND PLAN  69 y/o female with h/o chronic diastolic CHF, HTN, HLD, DM, COPD, and CKD, admitted for abdominal pain and weakness. Also found to have new reduction in LV systolic function and a LV thrombus.   1. Biventricular Systolic Dysfunction:  - Newly reduced LV systolic function with EF of 30-35%, was 50-55% on previous echo in 2014. Also with moderate RV systolic dysfunction with moderately elevated PA pressure of 62 mm Hg.  - She denies CP or dyspnea. - Continue LA nitrate, statin, hydralazine/BB. - Euvolemic on exam.  - She is not a candidate for ACE/ARB therapy given her CKD.  - Monitor volume status. Strict I/Os, daily weights and low sodium diet.   2. LV Thrombus:  - visualized on 2D echo. Medium-sized, 2.2 cm (L) x 1.2 cm (W). In the  setting of reduced LV systolic function. Her Hgb is stable. Given her CKD, IV heparin bridge to warfarin followed by pharmacy. INR remains therapeutic. She will need repeat echo on medical therapy in 2 months and if EF improved and no thrombus then could possibly come of coumadin.   4. NSTEMI - Trop peak of 5.64 - has since trended down. High risk to progress to ESRD needing HD with cath so will plan Myoview today evaluate for any large areas of ischemia. She does have coronary artery calcifications on Chest CT.   5. NSVT - Asymptomatic. Continue to monitor. Keep K+ >4. Continue low dose BB.  6. Abdominal Pain: - Small volume diarrhea this AM.  No abd pain.  Per GI.  7. CKD stage IV - per primary/nephrology.  8. Hypokalemia - as above  Signed, Murray Hodgkins NP

## 2015-01-27 ENCOUNTER — Encounter (HOSPITAL_COMMUNITY)
Admission: EM | Disposition: A | Discharge status: Home Health | Payer: Medicare Other | Source: Home / Self Care | Attending: Internal Medicine

## 2015-01-27 DIAGNOSIS — I270 Primary pulmonary hypertension: Secondary | ICD-10-CM

## 2015-01-27 HISTORY — PX: CARDIAC CATHETERIZATION: SHX172

## 2015-01-27 LAB — POCT I-STAT 3, VENOUS BLOOD GAS (G3P V)
ACID-BASE DEFICIT: 7 mmol/L — AB (ref 0.0–2.0)
BICARBONATE: 17.8 meq/L — AB (ref 20.0–24.0)
O2 Saturation: 64 %
PO2 VEN: 35 mmHg (ref 30.0–45.0)
TCO2: 19 mmol/L (ref 0–100)
pCO2, Ven: 33.6 mmHg — ABNORMAL LOW (ref 45.0–50.0)
pH, Ven: 7.332 — ABNORMAL HIGH (ref 7.250–7.300)

## 2015-01-27 LAB — BASIC METABOLIC PANEL WITH GFR
Anion gap: 8 (ref 5–15)
BUN: 29 mg/dL — ABNORMAL HIGH (ref 6–20)
CO2: 18 mmol/L — ABNORMAL LOW (ref 22–32)
Calcium: 7.9 mg/dL — ABNORMAL LOW (ref 8.9–10.3)
Chloride: 107 mmol/L (ref 101–111)
Creatinine, Ser: 3.78 mg/dL — ABNORMAL HIGH (ref 0.44–1.00)
GFR calc Af Amer: 13 mL/min — ABNORMAL LOW
GFR calc non Af Amer: 11 mL/min — ABNORMAL LOW
Glucose, Bld: 86 mg/dL (ref 65–99)
Potassium: 4 mmol/L (ref 3.5–5.1)
Sodium: 133 mmol/L — ABNORMAL LOW (ref 135–145)

## 2015-01-27 LAB — GLUCOSE, CAPILLARY
GLUCOSE-CAPILLARY: 153 mg/dL — AB (ref 65–99)
Glucose-Capillary: 94 mg/dL (ref 65–99)
Glucose-Capillary: 87 mg/dL (ref 65–99)
Glucose-Capillary: 88 mg/dL (ref 65–99)

## 2015-01-27 LAB — CBC
HCT: 37.2 % (ref 36.0–46.0)
HEMOGLOBIN: 12.2 g/dL (ref 12.0–15.0)
MCH: 28.6 pg (ref 26.0–34.0)
MCHC: 32.8 g/dL (ref 30.0–36.0)
MCV: 87.3 fL (ref 78.0–100.0)
PLATELETS: 401 10*3/uL — AB (ref 150–400)
RBC: 4.26 MIL/uL (ref 3.87–5.11)
RDW: 16.5 % — ABNORMAL HIGH (ref 11.5–15.5)
WBC: 7.2 10*3/uL (ref 4.0–10.5)

## 2015-01-27 LAB — PROTIME-INR
INR: 2.02 — ABNORMAL HIGH (ref 0.00–1.49)
Prothrombin Time: 22.8 seconds — ABNORMAL HIGH (ref 11.6–15.2)

## 2015-01-27 SURGERY — RIGHT HEART CATH

## 2015-01-27 MED ORDER — ACETAMINOPHEN 325 MG PO TABS: 650.0000 mg | ORAL_TABLET | ORAL | Status: DC | PRN

## 2015-01-27 MED ORDER — ONDANSETRON HCL 4 MG/2ML IJ SOLN: 4.0000 mg | Freq: Four times a day (QID) | INTRAMUSCULAR | Status: DC | PRN

## 2015-01-27 MED ORDER — ASPIRIN 81 MG PO CHEW: 81.0000 mg | CHEWABLE_TABLET | ORAL | Status: DC

## 2015-01-27 MED ORDER — SODIUM CHLORIDE 0.9 % IJ SOLN
3.0000 mL | Freq: Two times a day (BID) | INTRAMUSCULAR | Status: DC
Start: 2015-01-27 — End: 2015-01-30
  Administered 2015-01-27: 3 mL via INTRAVENOUS

## 2015-01-27 MED ORDER — MIDAZOLAM HCL 2 MG/2ML IJ SOLN
INTRAMUSCULAR | Status: DC | PRN
  Administered 2015-01-27 (×2): 1 mg via INTRAVENOUS

## 2015-01-27 MED ORDER — SODIUM CHLORIDE 0.9 % IV SOLN: INTRAVENOUS | Status: DC

## 2015-01-27 MED ORDER — LIDOCAINE HCL (PF) 1 % IJ SOLN
INTRAMUSCULAR | Status: DC | PRN
  Administered 2015-01-27: 15 mL via SUBCUTANEOUS

## 2015-01-27 MED ORDER — LIDOCAINE HCL (PF) 1 % IJ SOLN
INTRAMUSCULAR | Status: AC
  Filled 2015-01-27: qty 30

## 2015-01-27 MED ORDER — HEPARIN (PORCINE) IN NACL 2-0.9 UNIT/ML-% IJ SOLN
INTRAMUSCULAR | Status: AC
  Filled 2015-01-27: qty 500

## 2015-01-27 MED ORDER — SODIUM CHLORIDE 0.9 % IJ SOLN: 3.0000 mL | Freq: Two times a day (BID) | INTRAMUSCULAR | Status: DC

## 2015-01-27 MED ORDER — MIDAZOLAM HCL 2 MG/2ML IJ SOLN
INTRAMUSCULAR | Status: AC
  Filled 2015-01-27: qty 2

## 2015-01-27 MED ORDER — SODIUM CHLORIDE 0.9 % IJ SOLN: 3.0000 mL | INTRAMUSCULAR | Status: DC | PRN

## 2015-01-27 MED ORDER — WARFARIN SODIUM 2.5 MG PO TABS
2.5000 mg | ORAL_TABLET | Freq: Once | ORAL | Status: AC
Start: 2015-01-27 — End: 2015-01-27
  Administered 2015-01-27: 2.5 mg via ORAL
  Filled 2015-01-27: qty 1

## 2015-01-27 MED ORDER — FENTANYL CITRATE (PF) 100 MCG/2ML IJ SOLN
INTRAMUSCULAR | Status: DC | PRN
  Administered 2015-01-27 (×2): 25 ug via INTRAVENOUS

## 2015-01-27 MED ORDER — FUROSEMIDE 10 MG/ML IJ SOLN
100.0000 mg | Freq: Once | INTRAVENOUS | Status: AC
  Administered 2015-01-27: 100 mg via INTRAVENOUS
  Filled 2015-01-27: qty 10

## 2015-01-27 MED ORDER — SODIUM CHLORIDE 0.9 % IV SOLN: 250.0000 mL | INTRAVENOUS | Status: DC | PRN

## 2015-01-27 MED ORDER — SODIUM CHLORIDE 0.9 % IJ SOLN
3.0000 mL | Freq: Two times a day (BID) | INTRAMUSCULAR | Status: DC
  Administered 2015-01-28 – 2015-01-29 (×4): 3 mL via INTRAVENOUS

## 2015-01-27 MED ORDER — FENTANYL CITRATE (PF) 100 MCG/2ML IJ SOLN
INTRAMUSCULAR | Status: AC
  Filled 2015-01-27: qty 2

## 2015-01-27 SURGICAL SUPPLY — 11 items
CATH BALLN WEDGE 5F 110CM (CATHETERS) ×3 IMPLANT
CATH SWAN GANZ 7F STRAIGHT (CATHETERS) ×2 IMPLANT
GUIDEWIRE .025 260CM (WIRE) ×2 IMPLANT
KIT HEART RIGHT NAMIC (KITS) ×3 IMPLANT
PACK CARDIAC CATHETERIZATION (CUSTOM PROCEDURE TRAY) ×3 IMPLANT
PROTECTION STATION PRESSURIZED (MISCELLANEOUS) ×3
SHEATH FAST CATH BRACH 5F 5CM (SHEATH) ×3 IMPLANT
SHEATH PINNACLE 7F 10CM (SHEATH) ×2 IMPLANT
STATION PROTECTION PRESSURIZED (MISCELLANEOUS) IMPLANT
TRANSDUCER W/STOPCOCK (MISCELLANEOUS) ×4 IMPLANT
WIRE ASAHI PROWATER 180CM (WIRE) ×2 IMPLANT

## 2015-01-27 NOTE — Interval H&P Note (Signed)
History and Physical Interval Note:  01/27/2015 2:02 PM  Presho  has presented today for surgery, with the diagnosis of hf  The various methods of treatment have been discussed with the patient and family. After consideration of risks, benefits and other options for treatment, the patient has consented to  Procedure(s): Right Heart Cath (N/A) as a surgical intervention .  The patient's history has been reviewed, patient examined, no change in status, stable for surgery.  I have reviewed the patient's chart and labs.  Questions were answered to the patient's satisfaction.     Edlin Ford [employer]

## 2015-01-27 NOTE — Progress Notes (Signed)
Utilization Review completed. Becker Christopher RN BSN CM 

## 2015-01-27 NOTE — Progress Notes (Addendum)
Assessment/Recommendations 1. AKI on CKD 4 sec DM/HTN +/- sarcoidosis. Her Scr of 2.9 was in range of where she has been for the last several months, but bumped up fairly dramatically.  Weight up. Exam c/w fluid overload. Will give dose of IV furosemide. 2. NSVT - on low dose BB 3. LV thrombus on heparin->coumadin. ECHO in 2 months and if EF improved and no thrombus could possible come off coumadin per cardiology 4. Biventricular systolic heart failure 5. Diarrhea - per GI 6. HTN BP well controlled and no hypotension 7. DM 8. Metabolic acidosis - Na bicarb. Goal CO2 20-22  Subjective: Interval History: c/o DOE  Objective: Vital signs in last 24 hours: Temp:  [97.7 F (36.5 C)-98 F (36.7 C)] 97.9 F (36.6 C) (06/27 0443) Pulse Rate:  [66-77] 66 (06/27 1047) Resp:  [16-18] 16 (06/27 0443) BP: (110-144)/(44-86) 144/86 mmHg (06/27 1047) SpO2:  [91 %-100 %] 98 % (06/27 0443) Weight:  [94.4 kg (208 lb 1.8 oz)] 94.4 kg (208 lb 1.8 oz) (06/27 0443) Weight change: 0.1 kg (3.5 oz)  Intake/Output from previous day: 06/26 0701 - 06/27 0700 In: 37 [P.O.:590] Out: 310 [Urine:310] Intake/Output this shift: Total I/O In: 0  Out: 100 [Urine:100]  General appearance: alert and cooperative Resp: basilar rales Cardio: regular rate and rhythm, S1, S2 normal, no murmur, click, rub or gallop GI: tender RUQ and pos HPJ Extremities: edema 1+   Lab Results:  Recent Labs  01/26/15 0320 01/27/15 0530  WBC 7.2 7.2  HGB 12.1 12.2  HCT 36.5 37.2  PLT 379 401*   BMET:  Recent Labs  01/26/15 0320 01/27/15 0930  NA 132* 133*  K 3.8 4.0  CL 105 107  CO2 19* 18*  GLUCOSE 108* 86  BUN 28* 29*  CREATININE 4.02* 3.78*  CALCIUM 8.2* 7.9*   No results for input(s): PTH in the last 72 hours. Iron Studies: No results for input(s): IRON, TIBC, TRANSFERRIN, FERRITIN in the last 72 hours. Studies/Results: Nm Myocar Multi W/spect W/wall Motion / Ef  01/26/2015    There was no ST segment  deviation noted during stress.  No T wave inversion was noted during stress.  Defect 1: There is a defect of mild severity present in the mid  inferolateral location.  The study is normal.  This is an intermediate risk study.  The left ventricular ejection fraction is mildly decreased (45-54%).  Nuclear stress EF: 47%.    Scheduled: . antiseptic oral rinse  7 mL Mouth Rinse BID  . aspirin EC  81 mg Oral Daily  . atorvastatin  20 mg Oral QHS  . carvedilol  6.25 mg Oral BID WC  . famotidine  20 mg Oral Daily  . furosemide  100 mg Intravenous Once  . hydrALAZINE  50 mg Oral TID  . insulin aspart  0-5 Units Subcutaneous QHS  . insulin aspart  0-9 Units Subcutaneous TID WC  . isosorbide mononitrate  30 mg Oral Daily  . loperamide  2 mg Oral BID  . mometasone-formoterol  2 puff Inhalation BID  . saccharomyces boulardii  250 mg Oral BID  . sodium bicarbonate  1,300 mg Oral BID  . sodium chloride  3 mL Intravenous Q12H  . Warfarin - Pharmacist Dosing Inpatient   Does not apply q1800     LOS: 7 days   Rosine Solecki C 01/27/2015,11:17 AM

## 2015-01-27 NOTE — Progress Notes (Signed)
[facility] PROGRESS NOTE  [name] ION:[id number] DOB: [birth year] DOA: 01/20/2015 PCP: Antony Blackbird, MD  [name] is a 69 y.o. female with Past medical history of COPD, coronary artery disease, GERD, gout, obesity, chronic diastolic CHF, obstructive sleep apnea, type 2 diabetes mellitus, essential hypertension, chronic kidney disease. The patient is presenting with complaints of abdominal pain as well as nausea vomiting and diarrhea on ongoing since last 2 months progressively worsening leading to weakness and a fall.  Patient has been seen by GI: no further work up, cardiology: stress test 6/26 and nephrology:    Assessment/Plan: nausea, vomiting, diarrhea and abd pain: with inability to keep things down -patient has been PPI for the last 2-3 weeks w/o significant improvement; she has no half any endoscopic procedure in the last 5 years. -gi pathogen panel negative -due to elevated lactic acid, elevated procalcitonin and low grade temp; started on sepsis order with vanc, zosyn and flagyl. patient no longer septic in appearance and no clear source of infection identified. Antibiotic hves been discontinued. Patient's symptoms could be explained by NSTEMI and severe dehydration. -GI: Since she appears resolved and no bleeding cannot make a good case for endoscopic evaluation at this time  dehydration, hypokalemia and hypomagnesemia: -repleated  chronic CHF (combined disease): EF 30-35% and NSTEMI -troponin elevated -big decrease in EF, with hypokinesis and apical thrombus -cardiology consulted and on board; coumadin for 2 months and repeat echo -daily weights -strict intake and output -Not a candidate for ACE/ARB secondary to chronic kidney disease -Started on carvedilol, imdur and hydralazine -Myoview 6/26, RHC 6/27  essential HTN: -BP overall stable -will continue current meds  acute on CKD stage 4: will monitor Cr trend -If the patient ended up requiring  to have a catheterization  may end up on hemodialysis. -Renal service has been consulted. Patient has been started on bicarbonate tablets. Will follow recommendations  chronic resp failure due to COPD: will continue oxygen supplementation- wears 2 L at home -no wheezing -will continue home bronchodilators regimen  diabetes with nephropathy: continue SSI -holding oral hypoglycemic agents  metabolic acidosis - bicarb -most likely associated with renal failure and ongoing diarrhea -Renal service has been consulted and will follow their recommendations.   Code Status: Partial (no intubation) Family Communication: family at bedside 6/24 Disposition Plan:    Consultants:  GI ([facility])  Cardiology   Renal service  Procedures:  See below for x-ray reports   2-D echo Systolic function was moderately to severely reduced. The estimated ejection fraction was in the range of 30% to 35%. Wall motion was normal; there were no regional wall motion abnormalities. Doppler parameters are consistent with abnormal left ventricular relaxation (grade 1 diastolic dysfunction). Acoustic contrast opacification revealed a medium-sized, 2.2 cm (L) x 1.2 cm (W), apicalthrombusassociated with a hypokinetic segment. - Mitral valve: There was mild regurgitation. - Right ventricle: Systolic function was moderately reduced. - Pulmonary arteries: Systolic pressure was moderately increased. PA peak pressure: 62 mm Hg (S). - Pericardium, extracardiac: A trivial pericardial effusion was identified posterior to the heart.  Antibiotics:  Vanc 6/21>>6/23  Zosyn 6/21>>6/23  Flagyl stop on 6/22  HPI/Subjective: Feeling better today  Objective: Filed Vitals:   01/27/15 1047  BP: 144/86  Pulse: 66  Temp:   Resp:     Intake/Output Summary (Last 24 hours) at 01/27/15 1151 Last data filed at 01/27/15 1044  Gross per 24 hour  Intake    590 ml  Output  410 ml  Net    180 ml    Filed Weights   01/25/15 0453 01/26/15 0618 01/27/15 0443  Weight: 93.4 kg (205 lb 14.6 oz) 94.3 kg (207 lb 14.3 oz) 94.4 kg (208 lb 1.8 oz)    Exam:   General:  NAD  Cardiovascular: S1 and S2, no rubs or gallops, no JVD  Respiratory: good air movement, no wheezing, [place] in place and good O2 sat on 2L  Abdomen: soft, +BS  Musculoskeletal: trace edema bilaterally, no cyanosis   Data Reviewed: Basic Metabolic Panel:  Recent Labs Lab 01/21/15 0650 01/21/15 1523 01/22/15 0135 01/22/15 1205 01/24/15 0549 01/25/15 0638 01/26/15 0320 01/27/15 0930  NA  --   --  134* 134* 133* 133* 132* 133*  K  --   --  3.0* 3.6 3.9 3.7 3.8 4.0  CL  --   --  106 107 105 107 105 107  CO2  --   --  18* 15* 18* 18* 19* 18*  GLUCOSE  --   --  144* 127* 93 90 108* 86  BUN  --   --  18 18 22* 25* 28* 29*  CREATININE  --   --  2.78* 2.93* 3.73* 3.89* 4.02* 3.78*  CALCIUM  --   --  7.5* 7.6* 8.5* 8.5* 8.2* 7.9*  MG 0.9* 1.4* 1.9 1.8  --   --   --   --   PHOS  --   --   --   --  4.6 4.8* 5.0*  --    Liver Function Tests:  Recent Labs Lab 01/20/15 1459 01/21/15 0020 01/22/15 0135 01/24/15 0549 01/25/15 0638 01/26/15 0320  AST 38 59* 35  --   --   --   ALT 11* 11* 12*  --   --   --   ALKPHOS 304* 257* 249*  --   --   --   BILITOT 0.6 0.7 0.6  --   --   --   PROT 5.6* 4.5* 4.2*  --   --   --   ALBUMIN 2.1* 1.8* 1.6* 1.6* 1.5* 1.5*    Recent Labs Lab 01/20/15 1459  LIPASE 14*   CBC:  Recent Labs Lab 01/20/15 1459 01/21/15 0650  01/23/15 0625 01/24/15 0549 01/25/15 0638 01/26/15 0320 01/27/15 0530  WBC 7.4 6.7  < > 8.2 7.5 6.8 7.2 7.2  NEUTROABS 3.7 3.7  --   --   --   --   --   --   HGB 15.8* 14.0  < > 13.1 13.0 12.3 12.1 12.2  HCT 48.1* 42.7  < > 39.1 39.6 37.6 36.5 37.2  MCV 88.7 87.5  < > 86.1 87.0 87.0 86.5 87.3  PLT 375 353  < > 349 379 351 379 401*  < > = values in this interval not displayed. Cardiac Enzymes:  Recent Labs Lab 01/20/15 1702 01/21/15 1523  01/21/15 1830 01/23/15 1122 01/23/15 1600 01/23/15 2228  CKTOTAL 223  --   --   --   --   --   TROPONINI  --  5.64* 5.37* 2.77* 3.17* 2.54*   BNP (last 3 results)  Recent Labs  09/02/14 0510 09/03/14 0428 01/21/15 1830  BNP 1159.9* 1107.3* 1056.8*    ProBNP (last 3 results)  Recent Labs  04/26/14 2358 06/24/14 0945  PROBNP 3725.0* 324.0*    CBG:  Recent Labs Lab 01/26/15 1253 01/26/15 1746 01/26/15 2230 01/27/15 0818 01/27/15 1120  GLUCAP 137* 136* 116*  94 87    Studies: Nm Myocar Multi W/spect W/wall Motion / Ef  01/26/2015    There was no ST segment deviation noted during stress.  No T wave inversion was noted during stress.  Defect 1: There is a defect of mild severity present in the mid  inferolateral location.  The study is normal.  This is an intermediate risk study.  The left ventricular ejection fraction is mildly decreased (45-54%).  Nuclear stress EF: 47%.     Scheduled Meds: . antiseptic oral rinse  7 mL Mouth Rinse BID  . aspirin EC  81 mg Oral Daily  . atorvastatin  20 mg Oral QHS  . carvedilol  6.25 mg Oral BID WC  . famotidine  20 mg Oral Daily  . furosemide  100 mg Intravenous Once  . hydrALAZINE  50 mg Oral TID  . insulin aspart  0-5 Units Subcutaneous QHS  . insulin aspart  0-9 Units Subcutaneous TID WC  . isosorbide mononitrate  30 mg Oral Daily  . loperamide  2 mg Oral BID  . mometasone-formoterol  2 puff Inhalation BID  . saccharomyces boulardii  250 mg Oral BID  . sodium bicarbonate  1,300 mg Oral BID  . sodium chloride  3 mL Intravenous Q12H  . Warfarin - Pharmacist Dosing Inpatient   Does not apply q1800   Continuous Infusions: . sodium chloride 20 mL/hr at 01/24/15 1044    Principal Problem:   Lactic acidosis Active Problems:   Obesity   Essential hypertension   GERD   Psoriasis   NAUSEA AND VOMITING   Chronic diastolic heart failure   Diabetes mellitus with renal manifestation   Anemia, iron deficiency    Diarrhea   Sepsis   Protein-calorie malnutrition, severe   NSTEMI (non-ST elevated myocardial infarction)   Mural thrombus of cardiac apex   Acute renal failure superimposed on stage 4 chronic kidney disease   Hypokalemia   DCM (dilated cardiomyopathy)    Time spent: 25 minutes    [name]  [facility] Pager [phone]. If 7PM-7AM, please contact night-coverage at [email or web address], password [facility] 01/27/2015, 11:51 AM  LOS: 7 days

## 2015-01-27 NOTE — Progress Notes (Signed)
Patient ID: [name], female   DOB: 11-20-1945, 69 y.o.   MRN: [id number]   69 yo with history of chronic diastolic CHF (has been seen in CHF clinic), HTN, DM, CKD IV, OHS/OSA, sarcoidosis followed by Dr Lenna Gilford was admitted with abdominal pain/weakness/nausea/vomiting and increased lactate. She developed AKI with rise in creatinine up to 4, followed by nephrology.  She was also noted to have fall in EF by echo, now 30-35% with apical hypokinesis and an apical thrombus, moderate RV dysfunction with PASP 68.  GI symptoms not fully understood and reason for worsening creatinine not fully understood.   Cardiolite 6/26 with no definite ischemia or infarction, EF 47% (?accuracy of EF).   Currently denies dyspnea, comfortable in bed.  Still with mild abdominal discomfort but improved.    Scheduled Meds: . antiseptic oral rinse  7 mL Mouth Rinse BID  . aspirin EC  81 mg Oral Daily  . atorvastatin  20 mg Oral QHS  . carvedilol  6.25 mg Oral BID WC  . famotidine  20 mg Oral Daily  . hydrALAZINE  50 mg Oral TID  . insulin aspart  0-5 Units Subcutaneous QHS  . insulin aspart  0-9 Units Subcutaneous TID WC  . isosorbide mononitrate  30 mg Oral Daily  . loperamide  2 mg Oral BID  . mometasone-formoterol  2 puff Inhalation BID  . saccharomyces boulardii  250 mg Oral BID  . sodium bicarbonate  1,300 mg Oral BID  . sodium chloride  3 mL Intravenous Q12H  . Warfarin - Pharmacist Dosing Inpatient   Does not apply q1800   Continuous Infusions: . sodium chloride 20 mL/hr at 01/24/15 1044   PRN Meds:.acetaminophen **OR** acetaminophen, albuterol, fluticasone, gi cocktail, HYDROcodone-acetaminophen, ondansetron **OR** ondansetron (ZOFRAN) IV   Filed Vitals:   01/26/15 2128 01/26/15 2228 01/27/15 0443 01/27/15 0758  BP: 137/82 110/44 122/59 130/56  Pulse:  71 74 76  Temp:  98 F (36.7 C) 97.9 F (36.6 C)   TempSrc:   Oral   Resp:  16 16   Height:      Weight:   208 lb 1.8 oz (94.4 kg)     SpO2:  100% 98%     Intake/Output Summary (Last 24 hours) at 01/27/15 0837 Last data filed at 01/27/15 0435  Gross per 24 hour  Intake    590 ml  Output    310 ml  Net    280 ml    LABS: Basic Metabolic Panel:  Recent Labs  01/25/15 0638 01/26/15 0320  NA 133* 132*  K 3.7 3.8  CL 107 105  CO2 18* 19*  GLUCOSE 90 108*  BUN 25* 28*  CREATININE 3.89* 4.02*  CALCIUM 8.5* 8.2*  PHOS 4.8* 5.0*   Liver Function Tests:  Recent Labs  01/25/15 0638 01/26/15 0320  ALBUMIN 1.5* 1.5*   No results for input(s): LIPASE, AMYLASE in the last 72 hours. CBC:  Recent Labs  01/26/15 0320 01/27/15 0530  WBC 7.2 7.2  HGB 12.1 12.2  HCT 36.5 37.2  MCV 86.5 87.3  PLT 379 401*   Cardiac Enzymes: No results for input(s): CKTOTAL, CKMB, CKMBINDEX, TROPONINI in the last 72 hours. BNP: Invalid input(s): POCBNP D-Dimer: No results for input(s): DDIMER in the last 72 hours. Hemoglobin A1C: No results for input(s): HGBA1C in the last 72 hours. Fasting Lipid Panel: No results for input(s): CHOL, HDL, LDLCALC, TRIG, CHOLHDL, LDLDIRECT in the last 72 hours. Thyroid Function Tests: No results for  input(s): TSH, T4TOTAL, T3FREE, THYROIDAB in the last 72 hours.  Invalid input(s): FREET3 Anemia Panel: No results for input(s): VITAMINB12, FOLATE, FERRITIN, TIBC, IRON, RETICCTPCT in the last 72 hours.  RADIOLOGY: Ct Abdomen Pelvis Wo Contrast  01/20/2015   CLINICAL DATA:  68 year old female with 3-4 week history of nausea, vomiting, diarrhea, weakness and abdominal pain while eating. History of trauma from a fall today.  EXAM: CT ABDOMEN AND PELVIS WITHOUT CONTRAST  TECHNIQUE: Multidetector CT imaging of the abdomen and pelvis was performed following the standard protocol without IV contrast.  COMPARISON:  CT of the abdomen and pelvis 12/21/2014.  FINDINGS: Lower chest: Mild cylindrical bronchiectasis, thickening of the peribronchovascular interstitium and architectural distortion in the  lower lobes of the lungs bilaterally, which could be post infectious, or could be related to recurrent aspiration. Mild cardiomegaly. Atherosclerotic calcifications in the left main and left anterior descending coronary artery.  Hepatobiliary: Status post cholecystectomy. Irregular area of low attenuation and calcification in the posterior aspect of the right lobe of the liver is similar to remote prior studies dating back to at least 08/25/2013, favored to be related to sequela of remote trauma. No new hepatic lesions.  Pancreas: No definite pancreatic mass or surrounding inflammatory changes on today's noncontrast CT examination.  Spleen: Status post splenectomy. Multiple small splenules noted in the left side of the abdomen.  Adrenals/Urinary Tract: The unenhanced appearance of the adrenal glands bilaterally and the right kidney is normal. Mild scarring along the lateral aspect of the upper pole of the left kidney, likely related to prior splenectomy. Left kidney is otherwise normal in appearance. No hydroureteronephrosis. Urinary bladder is normal in appearance.  Stomach/Bowel: The unenhanced appearance of the stomach is normal. No pathologic dilatation of small bowel or colon. A few scattered colonic diverticulae are noted, without surrounding inflammatory changes to suggest an acute diverticulitis at this time.  Vascular/Lymphatic: Moderate atherosclerosis throughout the abdominal and pelvic vasculature, without definite aneurysm. No lymphadenopathy noted in the abdomen or pelvis on today's noncontrast CT examination.  Reproductive: Status post hysterectomy.  Ovaries are atrophic.  Other: No significant volume of ascites.  No pneumoperitoneum.  Musculoskeletal: There are no aggressive appearing lytic or blastic lesions noted in the visualized portions of the skeleton.  IMPRESSION: 1. No acute findings in the abdomen or pelvis on today's noncontrast CT examination to account for the patient's symptoms. 2. The  appearance of the lower lobes of the lungs could suggest post infectious scarring, but could also be seen in the setting of recurrent aspiration. Clinical correlation is suggested. 3. Mild cardiomegaly. 4. Atherosclerosis, including left anterior descending coronary artery disease. Please note that although the presence of coronary artery calcium documents the presence of coronary artery disease, the severity of this disease and any potential stenosis cannot be assessed on this non-gated CT examination. Assessment for potential risk factor modification, dietary therapy or pharmacologic therapy may be warranted, if clinically indicated. 5. Status post splenectomy. Multiple splenules are noted throughout the left side of the abdomen. 6. Status post cholecystectomy. 7. Additional incidental findings, as above.   Electronically Signed   By: Vinnie Langton M.D.   On: 01/20/2015 19:38   Nm Gastric Emptying  01/02/2015   CLINICAL DATA:  Severe abdominal pain with nausea and vomiting. Abdominal bloating.  EXAM: NUCLEAR MEDICINE GASTRIC EMPTYING SCAN  TECHNIQUE: After oral ingestion of radiolabeled meal, sequential abdominal images were obtained for 4 hours. Percentage of activity emptying the stomach was calculated at 1 hour, 2 hour,  3 hour, and 4 hours.  RADIOPHARMACEUTICALS:  2.0 mCi Technetium-34m labeled sulfur colloid orally cooked with one egg with 8 oz of ginger ale.  COMPARISON:  None.  FINDINGS: Expected location of the stomach in the left upper quadrant. Ingested meal empties the stomach gradually over the course of the study.  40.3% emptied at 1 hr ( normal >= 10%)  76.85% emptied at 2 hr ( normal >= 40%)  93.18% emptied at 3 hr ( normal >= 70%)  98.25% emptied at 4 hr ( normal >= 90%)  IMPRESSION: Normal gastric emptying study.   Electronically Signed   By: Lorriane Shire M.D.   On: 01/02/2015 15:04   US Renal  01/24/2015   CLINICAL DATA:  Acute renal failure, history essential benign hypertension,  coronary artery disease, COPD, type II diabetes mellitus, sarcoidosis, stage IV chronic kidney disease  EXAM: RENAL / URINARY TRACT ULTRASOUND COMPLETE  COMPARISON:  CT abdomen and pelvis 01/20/2015  FINDINGS: Degradation of image quality secondary to body habitus.  Right Kidney:  Length: 9.2 cm. Grossly normal cortical thickness and echogenicity. No definite mass, hydronephrosis or shadowing calcification.  Left Kidney:  Length: 10.0 cm. Grossly normal cortical thickness and echogenicity. No definite mass, hydronephrosis or shadowing calcification.  Bladder:  Partially distended, grossly unremarkable.  IMPRESSION: No acute abnormalities.   Electronically Signed   By: Lavonia Dana M.D.   On: 01/24/2015 16:54   Nm Myocar Multi W/spect W/wall Motion / Ef  01/26/2015    There was no ST segment deviation noted during stress.  No T wave inversion was noted during stress.  Defect 1: There is a defect of mild severity present in the mid  inferolateral location.  The study is normal.  This is an intermediate risk study.  The left ventricular ejection fraction is mildly decreased (45-54%).  Nuclear stress EF: 47%.    Dg Abd Acute W/chest  12/31/2014   CLINICAL DATA:  Nausea, vomiting and diarrhea for 1 week. Diverticulitis diagnosed approximately 2 weeks ago  EXAM: DG ABDOMEN ACUTE W/ 1V CHEST  COMPARISON:  Radiographs and CT 12/21/2014.  FINDINGS: Borderline cardiomegaly and vascular congestion are again noted, similar to the prior study. There is no confluent airspace opacity or significant pleural effusion.  The bowel gas pattern appears normal. There is no free intraperitoneal air or suspicious abdominal calcification. Scattered vascular calcifications are noted. There are surgical clips in the upper abdomen bilaterally. Mild lumbar spine degenerative changes are stable.  IMPRESSION: 1. Stable radiographs of the chest and abdomen. No acute abdominal findings identified. 2. Stable borderline cardiomegaly and  vascular congestion.   Electronically Signed   By: Richardean Sale M.D.   On: 12/31/2014 09:01    PHYSICAL EXAM General: NAD Neck: JVP ?8-9, no thyromegaly or thyroid nodule.  Lungs: Clear to auscultation bilaterally with normal respiratory effort. CV: Nondisplaced PMI.  Heart regular S1/S2, wide split S2, no S3/S4, 1/6 SEM apex.  Trace ankle edema.  No carotid bruit.  Normal pedal pulses.  Abdomen: Soft, mild diffuse tenderness, no hepatosplenomegaly, no distention.  Neurologic: Alert and oriented x 3.  Psych: Normal affect. Extremities: No clubbing or cyanosis.   TELEMETRY: Reviewed telemetry pt in NSR  ASSESSMENT AND PLAN: 69 yo with history of chronic diastolic CHF (has been seen in CHF clinic), HTN, DM, CKD IV, OHS/OSA, sarcoidosis followed by Dr Lenna Gilford was admitted with abdominal pain/weakness/nausea/vomiting and increased lactate. She developed AKI with rise in creatinine up to 4, followed by nephrology.  She  was also noted to have fall in EF by echo, now 30-35% with apical hypokinesis and an apical thrombus.  1. Cardiomyopathy: New fall in EF to 30-35% with apical hypokinesis and apical thrombus.  She has prior history of CAD (without significant disease in major vessels, however).  Suspect ischemic cardiomyopathy with MI involving the apex in the past, but Cardiolite yesterday actually showed no definitive ischemia or prior infarction.  She has had no recent chest pain. With rising creatinine, concern has been for hemodynamic role in renal dysfunction.  BP has been preserved and EF not markedly low.  She does have moderate-severe pulmonary HTN by echo.   Possible mild volume overload on exam.   - Agree with Dr Radford Pax that it would be reasonable to do RHC to assess cardiac output and PA pressure.   - Continue current Coreg, hydralazine, and Imdur.  No indication for aggressive diuresis.  2. CAD: Known from the past.  Still suspect MI as cause of EF fall with apical thrombus.  No chest  pain, no ischemia on Cardiolite.  Not candidate for coronary angiography at this point with AKI on CKD.  Continue ASA, statin.  3. Abdominal pain/elevated lactate: Cause still unclear. No definite etiology from abdominal CT.  ?mesenteric embolism from LV thrombus.  Still some tenderness but much improved.  4. AKI on CKD IV: Unsure etiology of worsening.  RHC today to assess cardiac output.   Loralie Champagne 01/27/2015 8:55 AM

## 2015-01-27 NOTE — H&P (View-Only) (Signed)
Patient ID: [name], female   DOB: Nov 16, 1945, 69 y.o.   MRN: [id number]   68 yo with history of chronic diastolic CHF (has been seen in CHF clinic), HTN, DM, CKD IV, OHS/OSA, sarcoidosis followed by Dr Lenna Gilford was admitted with abdominal pain/weakness/nausea/vomiting and increased lactate. She developed AKI with rise in creatinine up to 4, followed by nephrology.  She was also noted to have fall in EF by echo, now 30-35% with apical hypokinesis and an apical thrombus, moderate RV dysfunction with PASP 68.  GI symptoms not fully understood and reason for worsening creatinine not fully understood.   Cardiolite 6/26 with no definite ischemia or infarction, EF 47% (?accuracy of EF).   Currently denies dyspnea, comfortable in bed.  Still with mild abdominal discomfort but improved.    Scheduled Meds: . antiseptic oral rinse  7 mL Mouth Rinse BID  . aspirin EC  81 mg Oral Daily  . atorvastatin  20 mg Oral QHS  . carvedilol  6.25 mg Oral BID WC  . famotidine  20 mg Oral Daily  . hydrALAZINE  50 mg Oral TID  . insulin aspart  0-5 Units Subcutaneous QHS  . insulin aspart  0-9 Units Subcutaneous TID WC  . isosorbide mononitrate  30 mg Oral Daily  . loperamide  2 mg Oral BID  . mometasone-formoterol  2 puff Inhalation BID  . saccharomyces boulardii  250 mg Oral BID  . sodium bicarbonate  1,300 mg Oral BID  . sodium chloride  3 mL Intravenous Q12H  . Warfarin - Pharmacist Dosing Inpatient   Does not apply q1800   Continuous Infusions: . sodium chloride 20 mL/hr at 01/24/15 1044   PRN Meds:.acetaminophen **OR** acetaminophen, albuterol, fluticasone, gi cocktail, HYDROcodone-acetaminophen, ondansetron **OR** ondansetron (ZOFRAN) IV   Filed Vitals:   01/26/15 2128 01/26/15 2228 01/27/15 0443 01/27/15 0758  BP: 137/82 110/44 122/59 130/56  Pulse:  71 74 76  Temp:  98 F (36.7 C) 97.9 F (36.6 C)   TempSrc:   Oral   Resp:  16 16   Height:      Weight:   208 lb 1.8 oz (94.4 kg)     SpO2:  100% 98%     Intake/Output Summary (Last 24 hours) at 01/27/15 0837 Last data filed at 01/27/15 0435  Gross per 24 hour  Intake    590 ml  Output    310 ml  Net    280 ml    LABS: Basic Metabolic Panel:  Recent Labs  01/25/15 0638 01/26/15 0320  NA 133* 132*  K 3.7 3.8  CL 107 105  CO2 18* 19*  GLUCOSE 90 108*  BUN 25* 28*  CREATININE 3.89* 4.02*  CALCIUM 8.5* 8.2*  PHOS 4.8* 5.0*   Liver Function Tests:  Recent Labs  01/25/15 0638 01/26/15 0320  ALBUMIN 1.5* 1.5*   No results for input(s): LIPASE, AMYLASE in the last 72 hours. CBC:  Recent Labs  01/26/15 0320 01/27/15 0530  WBC 7.2 7.2  HGB 12.1 12.2  HCT 36.5 37.2  MCV 86.5 87.3  PLT 379 401*   Cardiac Enzymes: No results for input(s): CKTOTAL, CKMB, CKMBINDEX, TROPONINI in the last 72 hours. BNP: Invalid input(s): POCBNP D-Dimer: No results for input(s): DDIMER in the last 72 hours. Hemoglobin A1C: No results for input(s): HGBA1C in the last 72 hours. Fasting Lipid Panel: No results for input(s): CHOL, HDL, LDLCALC, TRIG, CHOLHDL, LDLDIRECT in the last 72 hours. Thyroid Function Tests: No results for  input(s): TSH, T4TOTAL, T3FREE, THYROIDAB in the last 72 hours.  Invalid input(s): FREET3 Anemia Panel: No results for input(s): VITAMINB12, FOLATE, FERRITIN, TIBC, IRON, RETICCTPCT in the last 72 hours.  RADIOLOGY: Ct Abdomen Pelvis Wo Contrast  01/20/2015   CLINICAL DATA:  69 year old female with 3-4 week history of nausea, vomiting, diarrhea, weakness and abdominal pain while eating. History of trauma from a fall today.  EXAM: CT ABDOMEN AND PELVIS WITHOUT CONTRAST  TECHNIQUE: Multidetector CT imaging of the abdomen and pelvis was performed following the standard protocol without IV contrast.  COMPARISON:  CT of the abdomen and pelvis 12/21/2014.  FINDINGS: Lower chest: Mild cylindrical bronchiectasis, thickening of the peribronchovascular interstitium and architectural distortion in the  lower lobes of the lungs bilaterally, which could be post infectious, or could be related to recurrent aspiration. Mild cardiomegaly. Atherosclerotic calcifications in the left main and left anterior descending coronary artery.  Hepatobiliary: Status post cholecystectomy. Irregular area of low attenuation and calcification in the posterior aspect of the right lobe of the liver is similar to remote prior studies dating back to at least 08/25/2013, favored to be related to sequela of remote trauma. No new hepatic lesions.  Pancreas: No definite pancreatic mass or surrounding inflammatory changes on today's noncontrast CT examination.  Spleen: Status post splenectomy. Multiple small splenules noted in the left side of the abdomen.  Adrenals/Urinary Tract: The unenhanced appearance of the adrenal glands bilaterally and the right kidney is normal. Mild scarring along the lateral aspect of the upper pole of the left kidney, likely related to prior splenectomy. Left kidney is otherwise normal in appearance. No hydroureteronephrosis. Urinary bladder is normal in appearance.  Stomach/Bowel: The unenhanced appearance of the stomach is normal. No pathologic dilatation of small bowel or colon. A few scattered colonic diverticulae are noted, without surrounding inflammatory changes to suggest an acute diverticulitis at this time.  Vascular/Lymphatic: Moderate atherosclerosis throughout the abdominal and pelvic vasculature, without definite aneurysm. No lymphadenopathy noted in the abdomen or pelvis on today's noncontrast CT examination.  Reproductive: Status post hysterectomy.  Ovaries are atrophic.  Other: No significant volume of ascites.  No pneumoperitoneum.  Musculoskeletal: There are no aggressive appearing lytic or blastic lesions noted in the visualized portions of the skeleton.  IMPRESSION: 1. No acute findings in the abdomen or pelvis on today's noncontrast CT examination to account for the patient's symptoms. 2. The  appearance of the lower lobes of the lungs could suggest post infectious scarring, but could also be seen in the setting of recurrent aspiration. Clinical correlation is suggested. 3. Mild cardiomegaly. 4. Atherosclerosis, including left anterior descending coronary artery disease. Please note that although the presence of coronary artery calcium documents the presence of coronary artery disease, the severity of this disease and any potential stenosis cannot be assessed on this non-gated CT examination. Assessment for potential risk factor modification, dietary therapy or pharmacologic therapy may be warranted, if clinically indicated. 5. Status post splenectomy. Multiple splenules are noted throughout the left side of the abdomen. 6. Status post cholecystectomy. 7. Additional incidental findings, as above.   Electronically Signed   By: Vinnie Langton M.D.   On: 01/20/2015 19:38   Nm Gastric Emptying  01/02/2015   CLINICAL DATA:  Severe abdominal pain with nausea and vomiting. Abdominal bloating.  EXAM: NUCLEAR MEDICINE GASTRIC EMPTYING SCAN  TECHNIQUE: After oral ingestion of radiolabeled meal, sequential abdominal images were obtained for 4 hours. Percentage of activity emptying the stomach was calculated at 1 hour, 2 hour,  3 hour, and 4 hours.  RADIOPHARMACEUTICALS:  2.0 mCi Technetium-66m labeled sulfur colloid orally cooked with one egg with 8 oz of ginger ale.  COMPARISON:  None.  FINDINGS: Expected location of the stomach in the left upper quadrant. Ingested meal empties the stomach gradually over the course of the study.  40.3% emptied at 1 hr ( normal >= 10%)  76.85% emptied at 2 hr ( normal >= 40%)  93.18% emptied at 3 hr ( normal >= 70%)  98.25% emptied at 4 hr ( normal >= 90%)  IMPRESSION: Normal gastric emptying study.   Electronically Signed   By: Lorriane Shire M.D.   On: 01/02/2015 15:04   US Renal  01/24/2015   CLINICAL DATA:  Acute renal failure, history essential benign hypertension,  coronary artery disease, COPD, type II diabetes mellitus, sarcoidosis, stage IV chronic kidney disease  EXAM: RENAL / URINARY TRACT ULTRASOUND COMPLETE  COMPARISON:  CT abdomen and pelvis 01/20/2015  FINDINGS: Degradation of image quality secondary to body habitus.  Right Kidney:  Length: 9.2 cm. Grossly normal cortical thickness and echogenicity. No definite mass, hydronephrosis or shadowing calcification.  Left Kidney:  Length: 10.0 cm. Grossly normal cortical thickness and echogenicity. No definite mass, hydronephrosis or shadowing calcification.  Bladder:  Partially distended, grossly unremarkable.  IMPRESSION: No acute abnormalities.   Electronically Signed   By: Lavonia Dana M.D.   On: 01/24/2015 16:54   Nm Myocar Multi W/spect W/wall Motion / Ef  01/26/2015    There was no ST segment deviation noted during stress.  No T wave inversion was noted during stress.  Defect 1: There is a defect of mild severity present in the mid  inferolateral location.  The study is normal.  This is an intermediate risk study.  The left ventricular ejection fraction is mildly decreased (45-54%).  Nuclear stress EF: 47%.    Dg Abd Acute W/chest  12/31/2014   CLINICAL DATA:  Nausea, vomiting and diarrhea for 1 week. Diverticulitis diagnosed approximately 2 weeks ago  EXAM: DG ABDOMEN ACUTE W/ 1V CHEST  COMPARISON:  Radiographs and CT 12/21/2014.  FINDINGS: Borderline cardiomegaly and vascular congestion are again noted, similar to the prior study. There is no confluent airspace opacity or significant pleural effusion.  The bowel gas pattern appears normal. There is no free intraperitoneal air or suspicious abdominal calcification. Scattered vascular calcifications are noted. There are surgical clips in the upper abdomen bilaterally. Mild lumbar spine degenerative changes are stable.  IMPRESSION: 1. Stable radiographs of the chest and abdomen. No acute abdominal findings identified. 2. Stable borderline cardiomegaly and  vascular congestion.   Electronically Signed   By: Richardean Sale M.D.   On: 12/31/2014 09:01    PHYSICAL EXAM General: NAD Neck: JVP ?8-9, no thyromegaly or thyroid nodule.  Lungs: Clear to auscultation bilaterally with normal respiratory effort. CV: Nondisplaced PMI.  Heart regular S1/S2, wide split S2, no S3/S4, 1/6 SEM apex.  Trace ankle edema.  No carotid bruit.  Normal pedal pulses.  Abdomen: Soft, mild diffuse tenderness, no hepatosplenomegaly, no distention.  Neurologic: Alert and oriented x 3.  Psych: Normal affect. Extremities: No clubbing or cyanosis.   TELEMETRY: Reviewed telemetry pt in NSR  ASSESSMENT AND PLAN: 69 yo with history of chronic diastolic CHF (has been seen in CHF clinic), HTN, DM, CKD IV, OHS/OSA, sarcoidosis followed by Dr Lenna Gilford was admitted with abdominal pain/weakness/nausea/vomiting and increased lactate. She developed AKI with rise in creatinine up to 4, followed by nephrology.  She  was also noted to have fall in EF by echo, now 30-35% with apical hypokinesis and an apical thrombus.  1. Cardiomyopathy: New fall in EF to 30-35% with apical hypokinesis and apical thrombus.  She has prior history of CAD (without significant disease in major vessels, however).  Suspect ischemic cardiomyopathy with MI involving the apex in the past, but Cardiolite yesterday actually showed no definitive ischemia or prior infarction.  She has had no recent chest pain. With rising creatinine, concern has been for hemodynamic role in renal dysfunction.  BP has been preserved and EF not markedly low.  She does have moderate-severe pulmonary HTN by echo.   Possible mild volume overload on exam.   - Agree with Dr Radford Pax that it would be reasonable to do RHC to assess cardiac output and PA pressure.   - Continue current Coreg, hydralazine, and Imdur.  No indication for aggressive diuresis.  2. CAD: Known from the past.  Still suspect MI as cause of EF fall with apical thrombus.  No chest  pain, no ischemia on Cardiolite.  Not candidate for coronary angiography at this point with AKI on CKD.  Continue ASA, statin.  3. Abdominal pain/elevated lactate: Cause still unclear. No definite etiology from abdominal CT.  ?mesenteric embolism from LV thrombus.  Still some tenderness but much improved.  4. AKI on CKD IV: Unsure etiology of worsening.  RHC today to assess cardiac output.   Loralie Champagne 01/27/2015 8:55 AM

## 2015-01-27 NOTE — Progress Notes (Signed)
Physical Therapy Treatment Patient Details Name: [name] MRN: [id number] DOB: 07/15/1946 Today's Date: 01/27/2015    History of Present Illness Pt is a 69 y/o female presenting with abdominal pain, N/V/D, and fall. Troponin elevated and pt was found to have NSTEMI - 2D echo revealed LV thrombus.     PT Comments    Pt progressing towards physical therapy goals. Overall demonstrated a good rehab effort today, however was limited due to abdominal cramping. Session was ended when pt had urgent diarrhea - she was left on [facility] with call bell in reach to alert nursing that she was finished on the commode. RN-student aware. Will continue to follow.   Follow Up Recommendations  Home health PT;Supervision/Assistance - 24 hour     Equipment Recommendations  None recommended by PT    Recommendations for Other Services       Precautions / Restrictions Precautions Precautions: Fall Restrictions Weight Bearing Restrictions: No    Mobility  Bed Mobility Overal bed mobility: Needs Assistance Bed Mobility: Supine to Sit     Supine to sit: Supervision     General bed mobility comments: Pt was able to transition to EOB with no physical assist. Supervision for safety.   Transfers Overall transfer level: Needs assistance Equipment used: Rolling walker (2 wheeled);None Transfers: Sit to/from [employer] to Stand: Min guard Stand pivot transfers: Min guard       General transfer comment: Min guard for safety when standing and taking side steps to [facility] from recliner.   Ambulation/Gait Ambulation/Gait assistance: Min guard;Supervision Ambulation Distance (Feet): 140 Feet Assistive device: Rolling walker (2 wheeled) Gait Pattern/deviations: Step-through pattern;Decreased stride length;Trunk flexed Gait velocity: Decreased Gait velocity interpretation: Below normal speed for age/gender General Gait Details: Pt was able to ambulate in the hall with frequent  standing rest breaks due to fatigue and SOB. Pt on RA and sats remained in high 90's throughout session. Occasional min guard provided but pt was mostly at a supervision level.    Stairs            Wheelchair Mobility    Modified Rankin (Stroke Patients Only)       Balance Overall balance assessment: Needs assistance Sitting-balance support: Feet supported;No upper extremity supported Sitting balance-Leahy Scale: Good     Standing balance support: No upper extremity supported Standing balance-Leahy Scale: Fair                      Cognition Arousal/Alertness: Awake/alert Behavior During Therapy: WFL for tasks assessed/performed Overall Cognitive Status: Within Functional Limits for tasks assessed                      Exercises      General Comments        Pertinent Vitals/Pain Pain Assessment: Faces Faces Pain Scale: Hurts even more Pain Location: Stomach and buttocks.  Pain Descriptors / Indicators: Burning;Discomfort Pain Intervention(s): Limited activity within patient's tolerance;Monitored during session;Repositioned    Home Living                      Prior Function            PT Goals (current goals can now be found in the care plan section) Acute Rehab PT Goals Patient Stated Goal: Return home PT Goal Formulation: With patient Time For Goal Achievement: 01/31/15 Potential to Achieve Goals: Good Progress towards PT goals: Progressing toward goals    Frequency  Min 3X/week    PT Plan Current plan remains appropriate    Co-evaluation             End of Session Equipment Utilized During Treatment: Gait belt;Oxygen Activity Tolerance: Patient limited by fatigue Patient left: with call bell/phone within reach;Other (comment) (Sitting on [facility])     Time: 6579-0383 PT Time Calculation (min) (ACUTE ONLY): 17 min  Charges:  $Gait Training: 8-22 mins                    G Codes:      Rolinda Roan 23-Feb-2015,  1:30 PM   Rolinda Roan, PT, DPT Acute Rehabilitation Services Pager: [phone]

## 2015-01-27 NOTE — Progress Notes (Signed)
ANTICOAGULATION CONSULT NOTE - Follow Up Consult  Pharmacy Consult for Coumadin  Indication: LV thrombus  Labs:  Recent Labs  01/25/15 0638 01/26/15 0320 01/27/15 0530 01/27/15 0930  HGB 12.3 12.1 12.2  --   HCT 37.6 36.5 37.2  --   PLT 351 379 401*  --   LABPROT 28.1* 24.9* 22.8*  --   INR 2.68* 2.28* 2.02*  --   CREATININE 3.89* 4.02*  --  3.78*    Assessment/Plan:  69yo female therapeutic continues on Coumadin for LV thrombus INR today is therapeutic at 2.0 and trending down  No bleeding reported  Patient is currently in cath lab for RHC.  Plan: Repeat Coumadin 2.5 mg po today as long as no cath complications Daily INR  Thanks for allowing pharmacy to be a part of this patient's care.  Erin Hearing PharmD., BCPS Clinical Pharmacist Pager [phone] 01/27/2015 2:23 PM

## 2015-01-28 ENCOUNTER — Encounter (HOSPITAL_COMMUNITY): Payer: Self-pay | Admitting: *Deleted

## 2015-01-28 ENCOUNTER — Inpatient Hospital Stay (HOSPITAL_COMMUNITY): Payer: Medicare Other

## 2015-01-28 DIAGNOSIS — I270 Primary pulmonary hypertension: Secondary | ICD-10-CM

## 2015-01-28 DIAGNOSIS — I5023 Acute on chronic systolic (congestive) heart failure: Secondary | ICD-10-CM

## 2015-01-28 LAB — CBC
HEMATOCRIT: 37.3 % (ref 36.0–46.0)
Hemoglobin: 12.4 g/dL (ref 12.0–15.0)
MCH: 28.8 pg (ref 26.0–34.0)
MCHC: 33.2 g/dL (ref 30.0–36.0)
MCV: 86.5 fL (ref 78.0–100.0)
Platelets: 430 10*3/uL — ABNORMAL HIGH (ref 150–400)
RBC: 4.31 MIL/uL (ref 3.87–5.11)
RDW: 16.5 % — ABNORMAL HIGH (ref 11.5–15.5)
WBC: 8 10*3/uL (ref 4.0–10.5)

## 2015-01-28 LAB — BASIC METABOLIC PANEL
Anion gap: 8 (ref 5–15)
BUN: 32 mg/dL — AB (ref 6–20)
CHLORIDE: 105 mmol/L (ref 101–111)
CO2: 17 mmol/L — ABNORMAL LOW (ref 22–32)
Calcium: 8 mg/dL — ABNORMAL LOW (ref 8.9–10.3)
Creatinine, Ser: 3.55 mg/dL — ABNORMAL HIGH (ref 0.44–1.00)
GFR calc Af Amer: 14 mL/min — ABNORMAL LOW (ref >60)
GFR calc non Af Amer: 12 mL/min — ABNORMAL LOW (ref >60)
GLUCOSE: 96 mg/dL (ref 65–99)
POTASSIUM: 3.8 mmol/L (ref 3.5–5.1)
Sodium: 130 mmol/L — ABNORMAL LOW (ref 135–145)

## 2015-01-28 LAB — GLUCOSE, CAPILLARY
GLUCOSE-CAPILLARY: 141 mg/dL — AB (ref 65–99)
GLUCOSE-CAPILLARY: 123 mg/dL — AB (ref 65–99)
Glucose-Capillary: 111 mg/dL — ABNORMAL HIGH (ref 65–99)

## 2015-01-28 LAB — PROTIME-INR
INR: 2.63 — ABNORMAL HIGH (ref 0.00–1.49)
Prothrombin Time: 27.7 seconds — ABNORMAL HIGH (ref 11.6–15.2)

## 2015-01-28 MED ORDER — DEXTROSE 5 % IV SOLN
120.0000 mg | Freq: Once | INTRAVENOUS | Status: AC
  Administered 2015-01-28: 120 mg via INTRAVENOUS
  Filled 2015-01-28: qty 12

## 2015-01-28 MED ORDER — ISOSORB DINITRATE-HYDRALAZINE 20-37.5 MG PO TABS
2.0000 | ORAL_TABLET | Freq: Three times a day (TID) | ORAL | Status: DC
  Administered 2015-01-28 – 2015-02-02 (×15): 2 via ORAL
  Filled 2015-01-28 (×19): qty 2

## 2015-01-28 MED ORDER — WARFARIN SODIUM 1 MG PO TABS
1.0000 mg | ORAL_TABLET | Freq: Once | ORAL | Status: AC
Start: 2015-01-28 — End: 2015-01-28
  Administered 2015-01-28: 1 mg via ORAL
  Filled 2015-01-28: qty 1

## 2015-01-28 MED FILL — Heparin Sodium (Porcine) 2 Unit/ML in Sodium Chloride 0.9%: INTRAMUSCULAR | Qty: 500 | Status: AC

## 2015-01-28 NOTE — Progress Notes (Signed)
[facility] PROGRESS NOTE  Mykenzi A Marijo File FTD:[id number] DOB: 1946/07/27 DOA: 01/20/2015 PCP: Antony Blackbird, MD  [name] is a 69 y.o. female with Past medical history of COPD, coronary artery disease, GERD, gout, obesity, chronic diastolic CHF, obstructive sleep apnea, type 2 diabetes mellitus, essential hypertension, chronic kidney disease. The patient is presenting with complaints of abdominal pain as well as nausea vomiting and diarrhea on ongoing since last 2 months progressively worsening leading to weakness and a fall.  Patient has been seen by GI: no further work up, cardiology: stress test 6/26 and nephrology:    Assessment/Plan: nausea, vomiting, diarrhea and abd pain: with inability to keep things down -patient has been PPI for the last 2-3 weeks w/o significant improvement; she has no half any endoscopic procedure in the last 5 years. -gi pathogen panel negative -due to elevated lactic acid, elevated procalcitonin and low grade temp; started on sepsis order with vanc, zosyn and flagyl. patient no longer septic in appearance and no clear source of infection identified. Antibiotic hves been discontinued. Patient's symptoms could be explained by NSTEMI and severe dehydration. -GI: Since she appears resolved and no bleeding cannot make a good case for endoscopic evaluation at this time  dehydration, hypokalemia and hypomagnesemia: -repleated  chronic CHF (combined disease): EF 30-35% and NSTEMI -troponin elevated -big decrease in EF, with hypokinesis and apical thrombus -cardiology consulted and on board; coumadin for 2 months and repeat echo -daily weights -strict intake and output -Not a candidate for ACE/ARB secondary to chronic kidney disease -Started on carvedilol, imdur and hydralazine -Myoview 6/26, RHC 6/27  essential HTN: -BP overall stable -will continue current meds  acute on CKD stage 4: will monitor Cr trend -If the patient ended up requiring  to have a catheterization  may end up on hemodialysis. -Renal service has been consulted. Patient has been started on bicarbonate tablets. Will follow recommendations  chronic resp failure due to COPD: will continue oxygen supplementation- wears 2 L at home -no wheezing -will continue home bronchodilators regimen  diabetes with nephropathy: continue SSI -holding oral hypoglycemic agents  metabolic acidosis - bicarb -most likely associated with renal failure and ongoing diarrhea -Renal service has been consulted and will follow their recommendations.   Code Status: Partial (no intubation) Family Communication: family at bedside 6/24 Disposition Plan:    Consultants:  GI ([facility])  Cardiology   Renal service  Procedures:  See below for x-ray reports   2-D echo Systolic function was moderately to severely reduced. The estimated ejection fraction was in the range of 30% to 35%. Wall motion was normal; there were no regional wall motion abnormalities. Doppler parameters are consistent with abnormal left ventricular relaxation (grade 1 diastolic dysfunction). Acoustic contrast opacification revealed a medium-sized, 2.2 cm (L) x 1.2 cm (W), apicalthrombusassociated with a hypokinetic segment. - Mitral valve: There was mild regurgitation. - Right ventricle: Systolic function was moderately reduced. - Pulmonary arteries: Systolic pressure was moderately increased. PA peak pressure: 62 mm Hg (S). - Pericardium, extracardiac: A trivial pericardial effusion was identified posterior to the heart.  Antibiotics:  Vanc 6/21>>6/23  Zosyn 6/21>>6/23  Flagyl stop on 6/22  HPI/Subjective: Feels "yucky"  Objective: Filed Vitals:   01/28/15 0647  BP: 139/61  Pulse: 87  Temp: 98.8 F (37.1 C)  Resp: 16    Intake/Output Summary (Last 24 hours) at 01/28/15 1205 Last data filed at 01/28/15 0955  Gross per 24 hour  Intake    512 ml  Output  1450 ml   Net   -938 ml   Filed Weights   01/26/15 0618 01/27/15 0443 01/28/15 0704  Weight: 94.3 kg (207 lb 14.3 oz) 94.4 kg (208 lb 1.8 oz) 91.6 kg (201 lb 15.1 oz)    Exam:   General:  NAD  Cardiovascular: S1 and S2, no rubs or gallops, no JVD  Respiratory: good air movement, no wheezing,  in place and good O2 sat on 2L  Abdomen: soft, +BS  Musculoskeletal: trace edema bilaterally, no cyanosis   Data Reviewed: Basic Metabolic Panel:  Recent Labs Lab 01/21/15 1523  01/22/15 0135 01/22/15 1205 01/24/15 0549 01/25/15 0638 01/26/15 0320 01/27/15 0930 01/28/15 0815  NA  --   < > 134* 134* 133* 133* 132* 133* 130*  K  --   < > 3.0* 3.6 3.9 3.7 3.8 4.0 3.8  CL  --   < > 106 107 105 107 105 107 105  CO2  --   < > 18* 15* 18* 18* 19* 18* 17*  GLUCOSE  --   < > 144* 127* 93 90 108* 86 96  BUN  --   < > 18 18 22* 25* 28* 29* 32*  CREATININE  --   < > 2.78* 2.93* 3.73* 3.89* 4.02* 3.78* 3.55*  CALCIUM  --   < > 7.5* 7.6* 8.5* 8.5* 8.2* 7.9* 8.0*  MG 1.4*  --  1.9 1.8  --   --   --   --   --   PHOS  --   --   --   --  4.6 4.8* 5.0*  --   --   < > = values in this interval not displayed. Liver Function Tests:  Recent Labs Lab 01/22/15 0135 01/24/15 0549 01/25/15 0638 01/26/15 0320  AST 35  --   --   --   ALT 12*  --   --   --   ALKPHOS 249*  --   --   --   BILITOT 0.6  --   --   --   PROT 4.2*  --   --   --   ALBUMIN 1.6* 1.6* 1.5* 1.5*   No results for input(s): LIPASE, AMYLASE in the last 168 hours. CBC:  Recent Labs Lab 01/24/15 0549 01/25/15 0638 01/26/15 0320 01/27/15 0530 01/28/15 0815  WBC 7.5 6.8 7.2 7.2 8.0  HGB 13.0 12.3 12.1 12.2 12.4  HCT 39.6 37.6 36.5 37.2 37.3  MCV 87.0 87.0 86.5 87.3 86.5  PLT 379 351 379 401* 430*   Cardiac Enzymes:  Recent Labs Lab 01/21/15 1523 01/21/15 1830 01/23/15 1122 01/23/15 1600 01/23/15 2228  TROPONINI 5.64* 5.37* 2.77* 3.17* 2.54*   BNP (last 3 results)  Recent Labs  09/02/14 0510 09/03/14 0428  01/21/15 1830  BNP 1159.9* 1107.3* 1056.8*    ProBNP (last 3 results)  Recent Labs  04/26/14 2358 06/24/14 0945  PROBNP 3725.0* 324.0*    CBG:  Recent Labs Lab 01/27/15 1120 01/27/15 1731 01/27/15 2227 01/28/15 0810 01/28/15 1154  GLUCAP 87 88 153* 111* 141*    Studies: Nm Myocar Multi W/spect W/wall Motion / Ef  01/26/2015    There was no ST segment deviation noted during stress.  No T wave inversion was noted during stress.  Defect 1: There is a defect of mild severity present in the mid  inferolateral location.  The study is normal.  This is an intermediate risk study.  The left ventricular ejection fraction is mildly decreased (  45-54%).  Nuclear stress EF: 47%.     Scheduled Meds: . antiseptic oral rinse  7 mL Mouth Rinse BID  . aspirin  81 mg Oral Pre-Cath  . aspirin EC  81 mg Oral Daily  . atorvastatin  20 mg Oral QHS  . carvedilol  6.25 mg Oral BID WC  . famotidine  20 mg Oral Daily  . furosemide  120 mg Intravenous Once  . insulin aspart  0-5 Units Subcutaneous QHS  . insulin aspart  0-9 Units Subcutaneous TID WC  . isosorbide-hydrALAZINE  2 tablet Oral TID  . loperamide  2 mg Oral BID  . mometasone-formoterol  2 puff Inhalation BID  . saccharomyces boulardii  250 mg Oral BID  . sodium bicarbonate  1,300 mg Oral BID  . sodium chloride  3 mL Intravenous Q12H  . sodium chloride  3 mL Intravenous Q12H  . sodium chloride  3 mL Intravenous Q12H  . Warfarin - Pharmacist Dosing Inpatient   Does not apply q1800   Continuous Infusions: . sodium chloride 20 mL/hr at 01/24/15 1044  . sodium chloride      Principal Problem:   Lactic acidosis Active Problems:   Obesity   Essential hypertension   GERD   Psoriasis   NAUSEA AND VOMITING   Chronic diastolic heart failure   Diabetes mellitus with renal manifestation   Anemia, iron deficiency   Diarrhea   Sepsis   Protein-calorie malnutrition, severe   NSTEMI (non-ST elevated myocardial infarction)    Mural thrombus of cardiac apex   Acute renal failure superimposed on stage 4 chronic kidney disease   Hypokalemia   DCM (dilated cardiomyopathy)    Time spent: 25 minutes    Analese Sovine  [facility] Pager [phone]. If 7PM-7AM, please contact night-coverage at [email or web address], password [facility] 01/28/2015, 12:05 PM  LOS: 8 days

## 2015-01-28 NOTE — Progress Notes (Signed)
Assessment/Recommendations 1. AKI on CKD 4 sec DM/HTN +/- sarcoidosis. Her Scr of 2.9 was in range of where she has been for the last several months, but bumped up fairly dramatically. Weight up. Exam c/w fluid overload. Another dose of furosemide given today by Cards. CXR. 2. NSVT - on low dose BB 3. LV thrombus on heparin->coumadin.  4. Biventricular systolic heart failure 5. Metabolic acidosis - Na bicarb. Goal CO2>20  Subjective: Interval History: has complaints DOE.  Objective: Vital signs in last 24 hours: Temp:  [97.6 F (36.4 C)-98.8 F (37.1 C)] 98.8 F (37.1 C) (06/28 0647) Pulse Rate:  [0-268] 87 (06/28 0647) Resp:  [0-87] 16 (06/28 0647) BP: (114-170)/(50-97) 139/61 mmHg (06/28 0647) SpO2:  [0 %-100 %] 97 % (06/28 0853) Weight:  [91.6 kg (201 lb 15.1 oz)] 91.6 kg (201 lb 15.1 oz) (06/28 0704) Weight change:   Intake/Output from previous day: 06/27 0701 - 06/28 0700 In: 272 [P.O.:222; IV Piggyback:50] Out: 1350 [HRCBU:3845] Intake/Output this shift: Total I/O In: 240 [P.O.:240] Out: 200 [Urine:200]  General appearance: alert and cooperative Resp: rales bibasilar Chest wall: no tenderness Cardio: regular rate and rhythm, S1, S2 normal, no murmur, click, rub or gallop Extremities: edema tr  Lab Results:  Recent Labs  01/27/15 0530 01/28/15 0815  WBC 7.2 8.0  HGB 12.2 12.4  HCT 37.2 37.3  PLT 401* 430*   BMET:  Recent Labs  01/27/15 0930 01/28/15 0815  NA 133* 130*  K 4.0 3.8  CL 107 105  CO2 18* 17*  GLUCOSE 86 96  BUN 29* 32*  CREATININE 3.78* 3.55*  CALCIUM 7.9* 8.0*   No results for input(s): PTH in the last 72 hours. Iron Studies: No results for input(s): IRON, TIBC, TRANSFERRIN, FERRITIN in the last 72 hours. Studies/Results: No results found.  Scheduled: . antiseptic oral rinse  7 mL Mouth Rinse BID  . aspirin  81 mg Oral Pre-Cath  . aspirin EC  81 mg Oral Daily  . atorvastatin  20 mg Oral QHS  . carvedilol  6.25 mg Oral BID WC   . famotidine  20 mg Oral Daily  . furosemide  120 mg Intravenous Once  . insulin aspart  0-5 Units Subcutaneous QHS  . insulin aspart  0-9 Units Subcutaneous TID WC  . isosorbide-hydrALAZINE  2 tablet Oral TID  . loperamide  2 mg Oral BID  . mometasone-formoterol  2 puff Inhalation BID  . saccharomyces boulardii  250 mg Oral BID  . sodium bicarbonate  1,300 mg Oral BID  . sodium chloride  3 mL Intravenous Q12H  . sodium chloride  3 mL Intravenous Q12H  . sodium chloride  3 mL Intravenous Q12H  . Warfarin - Pharmacist Dosing Inpatient   Does not apply q1800     LOS: 8 days   Katryn Plummer C 01/28/2015,12:34 PM

## 2015-01-28 NOTE — Progress Notes (Signed)
Patient ID: [name], female   DOB: [birth year], 69 y.o.   MRN: [id number]   69 yo with history of chronic diastolic CHF (has been seen in CHF clinic), HTN, DM, CKD IV, OHS/OSA, sarcoidosis followed by Dr Lenna Gilford was admitted with abdominal pain/weakness/nausea/vomiting and increased lactate. She developed AKI with rise in creatinine up to 4, followed by nephrology.  She was also noted to have fall in EF by echo, now 30-35% with apical hypokinesis and an apical thrombus, moderate RV dysfunction with PASP 68.  GI symptoms not fully understood and reason for worsening creatinine not fully understood.   Cardiolite 6/26 with no definite ischemia or infarction, EF 47% (?accuracy of EF).   Currently denies dyspnea, comfortable in bed.  Got Lasix 100 mg IV x 1 yesterday with some urine output.   Awaiting creatinine this morning.   RHC Procedural Findings (6/29): Hemodynamics (mmHg) RA mean 13 RV 74/15 PA 66/26, mean 39 PCWP mean 16 Oxygen saturations: PA 64% AO 97% Cardiac Output (Fick) 4.71  Cardiac Index (Fick) 2.43 PVR 4.9 WU Cardiac Output (Thermo) 4.82 Cardiac Index (Thermo) 2.48  PVR 4.8 WU  Scheduled Meds: . antiseptic oral rinse  7 mL Mouth Rinse BID  . aspirin  81 mg Oral Pre-Cath  . aspirin EC  81 mg Oral Daily  . atorvastatin  20 mg Oral QHS  . carvedilol  6.25 mg Oral BID WC  . famotidine  20 mg Oral Daily  . insulin aspart  0-5 Units Subcutaneous QHS  . insulin aspart  0-9 Units Subcutaneous TID WC  . isosorbide-hydrALAZINE  2 tablet Oral TID  . loperamide  2 mg Oral BID  . mometasone-formoterol  2 puff Inhalation BID  . saccharomyces boulardii  250 mg Oral BID  . sodium bicarbonate  1,300 mg Oral BID  . sodium chloride  3 mL Intravenous Q12H  . sodium chloride  3 mL Intravenous Q12H  . sodium chloride  3 mL Intravenous Q12H  . Warfarin - Pharmacist Dosing Inpatient   Does not apply q1800   Continuous Infusions: . sodium chloride 20 mL/hr at 01/24/15 1044  .  sodium chloride     PRN Meds:.sodium chloride, sodium chloride, sodium chloride, acetaminophen **OR** acetaminophen, albuterol, fluticasone, gi cocktail, HYDROcodone-acetaminophen, ondansetron **OR** ondansetron (ZOFRAN) IV, sodium chloride, sodium chloride   Filed Vitals:   01/27/15 2037 01/27/15 2228 01/28/15 0647 01/28/15 0704  BP:  114/50 139/61   Pulse:  69 87   Temp:  97.6 F (36.4 C) 98.8 F (37.1 C)   TempSrc:  Oral Oral   Resp:  16 16   Height:      Weight:    201 lb 15.1 oz (91.6 kg)  SpO2: 95% 97% 99%     Intake/Output Summary (Last 24 hours) at 01/28/15 0844 Last data filed at 01/28/15 0432  Gross per 24 hour  Intake    272 ml  Output   1350 ml  Net  -1078 ml    LABS: Basic Metabolic Panel:  Recent Labs  01/26/15 0320 01/27/15 0930  NA 132* 133*  K 3.8 4.0  CL 105 107  CO2 19* 18*  GLUCOSE 108* 86  BUN 28* 29*  CREATININE 4.02* 3.78*  CALCIUM 8.2* 7.9*  PHOS 5.0*  --    Liver Function Tests:  Recent Labs  01/26/15 0320  ALBUMIN 1.5*   No results for input(s): LIPASE, AMYLASE in the last 72 hours. CBC:  Recent Labs  01/27/15 0530 01/28/15 0815  WBC 7.2 8.0  HGB 12.2 12.4  HCT 37.2 37.3  MCV 87.3 86.5  PLT 401* 430*   Cardiac Enzymes: No results for input(s): CKTOTAL, CKMB, CKMBINDEX, TROPONINI in the last 72 hours. BNP: Invalid input(s): POCBNP D-Dimer: No results for input(s): DDIMER in the last 72 hours. Hemoglobin A1C: No results for input(s): HGBA1C in the last 72 hours. Fasting Lipid Panel: No results for input(s): CHOL, HDL, LDLCALC, TRIG, CHOLHDL, LDLDIRECT in the last 72 hours. Thyroid Function Tests: No results for input(s): TSH, T4TOTAL, T3FREE, THYROIDAB in the last 72 hours.  Invalid input(s): FREET3 Anemia Panel: No results for input(s): VITAMINB12, FOLATE, FERRITIN, TIBC, IRON, RETICCTPCT in the last 72 hours.  RADIOLOGY: Ct Abdomen Pelvis Wo Contrast  01/20/2015   CLINICAL DATA:  69 year old female with 3-4  week history of nausea, vomiting, diarrhea, weakness and abdominal pain while eating. History of trauma from a fall today.  EXAM: CT ABDOMEN AND PELVIS WITHOUT CONTRAST  TECHNIQUE: Multidetector CT imaging of the abdomen and pelvis was performed following the standard protocol without IV contrast.  COMPARISON:  CT of the abdomen and pelvis 12/21/2014.  FINDINGS: Lower chest: Mild cylindrical bronchiectasis, thickening of the peribronchovascular interstitium and architectural distortion in the lower lobes of the lungs bilaterally, which could be post infectious, or could be related to recurrent aspiration. Mild cardiomegaly. Atherosclerotic calcifications in the left main and left anterior descending coronary artery.  Hepatobiliary: Status post cholecystectomy. Irregular area of low attenuation and calcification in the posterior aspect of the right lobe of the liver is similar to remote prior studies dating back to at least 08/25/2013, favored to be related to sequela of remote trauma. No new hepatic lesions.  Pancreas: No definite pancreatic mass or surrounding inflammatory changes on today's noncontrast CT examination.  Spleen: Status post splenectomy. Multiple small splenules noted in the left side of the abdomen.  Adrenals/Urinary Tract: The unenhanced appearance of the adrenal glands bilaterally and the right kidney is normal. Mild scarring along the lateral aspect of the upper pole of the left kidney, likely related to prior splenectomy. Left kidney is otherwise normal in appearance. No hydroureteronephrosis. Urinary bladder is normal in appearance.  Stomach/Bowel: The unenhanced appearance of the stomach is normal. No pathologic dilatation of small bowel or colon. A few scattered colonic diverticulae are noted, without surrounding inflammatory changes to suggest an acute diverticulitis at this time.  Vascular/Lymphatic: Moderate atherosclerosis throughout the abdominal and pelvic vasculature, without definite  aneurysm. No lymphadenopathy noted in the abdomen or pelvis on today's noncontrast CT examination.  Reproductive: Status post hysterectomy.  Ovaries are atrophic.  Other: No significant volume of ascites.  No pneumoperitoneum.  Musculoskeletal: There are no aggressive appearing lytic or blastic lesions noted in the visualized portions of the skeleton.  IMPRESSION: 1. No acute findings in the abdomen or pelvis on today's noncontrast CT examination to account for the patient's symptoms. 2. The appearance of the lower lobes of the lungs could suggest post infectious scarring, but could also be seen in the setting of recurrent aspiration. Clinical correlation is suggested. 3. Mild cardiomegaly. 4. Atherosclerosis, including left anterior descending coronary artery disease. Please note that although the presence of coronary artery calcium documents the presence of coronary artery disease, the severity of this disease and any potential stenosis cannot be assessed on this non-gated CT examination. Assessment for potential risk factor modification, dietary therapy or pharmacologic therapy may be warranted, if clinically indicated. 5. Status post splenectomy. Multiple splenules are noted throughout the left  side of the abdomen. 6. Status post cholecystectomy. 7. Additional incidental findings, as above.   Electronically Signed   By: Vinnie Langton M.D.   On: 01/20/2015 19:38   Nm Gastric Emptying  01/02/2015   CLINICAL DATA:  Severe abdominal pain with nausea and vomiting. Abdominal bloating.  EXAM: NUCLEAR MEDICINE GASTRIC EMPTYING SCAN  TECHNIQUE: After oral ingestion of radiolabeled meal, sequential abdominal images were obtained for 4 hours. Percentage of activity emptying the stomach was calculated at 1 hour, 2 hour, 3 hour, and 4 hours.  RADIOPHARMACEUTICALS:  2.0 mCi Technetium-53m labeled sulfur colloid orally cooked with one egg with 8 oz of ginger ale.  COMPARISON:  None.  FINDINGS: Expected location of the  stomach in the left upper quadrant. Ingested meal empties the stomach gradually over the course of the study.  40.3% emptied at 1 hr ( normal >= 10%)  76.85% emptied at 2 hr ( normal >= 40%)  93.18% emptied at 3 hr ( normal >= 70%)  98.25% emptied at 4 hr ( normal >= 90%)  IMPRESSION: Normal gastric emptying study.   Electronically Signed   By: Lorriane Shire M.D.   On: 01/02/2015 15:04   US Renal  01/24/2015   CLINICAL DATA:  Acute renal failure, history essential benign hypertension, coronary artery disease, COPD, type II diabetes mellitus, sarcoidosis, stage IV chronic kidney disease  EXAM: RENAL / URINARY TRACT ULTRASOUND COMPLETE  COMPARISON:  CT abdomen and pelvis 01/20/2015  FINDINGS: Degradation of image quality secondary to body habitus.  Right Kidney:  Length: 9.2 cm. Grossly normal cortical thickness and echogenicity. No definite mass, hydronephrosis or shadowing calcification.  Left Kidney:  Length: 10.0 cm. Grossly normal cortical thickness and echogenicity. No definite mass, hydronephrosis or shadowing calcification.  Bladder:  Partially distended, grossly unremarkable.  IMPRESSION: No acute abnormalities.   Electronically Signed   By: Lavonia Dana M.D.   On: 01/24/2015 16:54   Nm Myocar Multi W/spect W/wall Motion / Ef  01/26/2015    There was no ST segment deviation noted during stress.  No T wave inversion was noted during stress.  Defect 1: There is a defect of mild severity present in the mid  inferolateral location.  The study is normal.  This is an intermediate risk study.  The left ventricular ejection fraction is mildly decreased (45-54%).  Nuclear stress EF: 47%.    Dg Abd Acute W/chest  12/31/2014   CLINICAL DATA:  Nausea, vomiting and diarrhea for 1 week. Diverticulitis diagnosed approximately 2 weeks ago  EXAM: DG ABDOMEN ACUTE W/ 1V CHEST  COMPARISON:  Radiographs and CT 12/21/2014.  FINDINGS: Borderline cardiomegaly and vascular congestion are again noted, similar to the  prior study. There is no confluent airspace opacity or significant pleural effusion.  The bowel gas pattern appears normal. There is no free intraperitoneal air or suspicious abdominal calcification. Scattered vascular calcifications are noted. There are surgical clips in the upper abdomen bilaterally. Mild lumbar spine degenerative changes are stable.  IMPRESSION: 1. Stable radiographs of the chest and abdomen. No acute abdominal findings identified. 2. Stable borderline cardiomegaly and vascular congestion.   Electronically Signed   By: Richardean Sale M.D.   On: 12/31/2014 09:01    PHYSICAL EXAM General: NAD Neck: JVP 8-9, no thyromegaly or thyroid nodule.  Lungs: Clear to auscultation bilaterally with normal respiratory effort. CV: Nondisplaced PMI.  Heart regular S1/S2, wide split S2, no S3/S4, 1/6 SEM apex.  Trace ankle edema.  No carotid bruit.  Normal  pedal pulses.  Abdomen: Soft, mild diffuse tenderness, no hepatosplenomegaly, no distention.  Neurologic: Alert and oriented x 3.  Psych: Normal affect. Extremities: No clubbing or cyanosis. Right groin cath site benign  TELEMETRY: Reviewed telemetry pt in NSR  ASSESSMENT AND PLAN: 69 yo with history of chronic diastolic CHF (has been seen in CHF clinic), HTN, DM, CKD IV, OHS/OSA, sarcoidosis followed by Dr Lenna Gilford was admitted with abdominal pain/weakness/nausea/vomiting and increased lactate. She developed AKI with rise in creatinine up to 4, followed by nephrology.  She was also noted to have fall in EF by echo, now 30-35% with apical hypokinesis and an apical thrombus.  1. Cardiomyopathy: New fall in EF to 30-35% with apical hypokinesis and apical thrombus.  She has prior history of CAD (without significant disease in major vessels, however).  Suspect ischemic cardiomyopathy with MI involving the apex in the past, but Cardiolite this admission actually showed no definitive ischemia or prior infarction.  She has had no recent chest pain. RHC  showed preserved cardiac output with moderately elevated RV filling pressure, mildly elevated LV filling pressure, and moderate pulmonary hypertension. - Continue Coreg, will transition to Bidil 2 tabs tid.    - She got IV Lasix yesterday per renal with some diuresis.  Would be reasonable to repeat Lasix IV today with some volume overload by RHC.  2. CAD: Known from the past.  Still suspect MI as cause of EF fall with apical thrombus.  No chest pain, no ischemia on Cardiolite.  Not candidate for coronary angiography at this point with AKI on CKD.  Continue ASA, statin.  3. Abdominal pain/elevated lactate: Cause still unclear. No definite etiology from abdominal CT.  ?mesenteric embolism from LV thrombus.  Still some tenderness but much improved.  4. AKI on CKD IV: Unsure etiology of worsening.  Cardiac output preserved by RHC yesteraday.  Pending BMET today but creatinine was better yesterday. 5. 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  Suspect there is a component of group 3 PAH from OHS/OSA.  Sarcoidosis may also play a role in her PAH, but not sure how active this actually is.  For now, would treat with oxygen and CPAP for OHS/OSA.  Could consider ERA down the road if sarcoidosis is thought to play a major role in her [place].    Loralie Champagne 01/28/2015 8:44 AM

## 2015-01-28 NOTE — Care Management (Signed)
Important Message  Patient Details  Name: [name] MRN: [id number] Date of Birth: 1945/08/27   Medicare Important Message Given:  Yes-second notification given    Nathen May 01/28/2015, 1:39 PM

## 2015-01-28 NOTE — Progress Notes (Signed)
ANTICOAGULATION CONSULT NOTE - Follow Up Consult  Pharmacy Consult for Coumadin Indication: LV thrombus  Allergies  Allergen Reactions  . Adhesive [Tape] Other (See Comments)    Blisters   . Avelox [Moxifloxacin Hcl In Nacl] Other (See Comments)    GI upset  . Codeine Other (See Comments)    "crazy"   . Guaifenesin Nausea And Vomiting    Takes Mucinex at home without issue  . Latex Swelling  . Oxycodone Nausea And Vomiting    Takes Percocet at home without issue    Patient Measurements: Height: 5' 2.4" (158.5 cm) Weight: 201 lb 15.1 oz (91.6 kg) IBW/kg (Calculated) : 51.02  Vital Signs: Temp: 98.8 F (37.1 C) (06/28 0647) Temp Source: Oral (06/28 0647) BP: 139/61 mmHg (06/28 0647) Pulse Rate: 87 (06/28 0647)  Labs:  Recent Labs  01/26/15 0320 01/27/15 0530 01/27/15 0930 01/28/15 0815  HGB 12.1 12.2  --  12.4  HCT 36.5 37.2  --  37.3  PLT 379 401*  --  430*  LABPROT 24.9* 22.8*  --  27.7*  INR 2.28* 2.02*  --  2.63*  CREATININE 4.02*  --  3.78* 3.55*    Estimated Creatinine Clearance: 16.5 mL/min (by C-G formula based on Cr of 3.55).  Assessment: 68yof continues on coumadin for LV thrombus. INR is therapeutic, but has jumped a little 2-->2.63. She appears to be very sensitive to small dose changes in coumadin. CBC stable. No bleeding reported.  Goal of Therapy:  INR 2-3 Monitor platelets by anticoagulation protocol: Yes   Plan:  1) Coumadin 1mg  x 1 2) Daily INR  Deboraha Sprang 01/28/2015,1:57 PM

## 2015-01-29 LAB — BASIC METABOLIC PANEL
Anion gap: 11 (ref 5–15)
BUN: 31 mg/dL — ABNORMAL HIGH (ref 6–20)
CALCIUM: 8.1 mg/dL — AB (ref 8.9–10.3)
CO2: 21 mmol/L — ABNORMAL LOW (ref 22–32)
Chloride: 100 mmol/L — ABNORMAL LOW (ref 101–111)
Creatinine, Ser: 3.47 mg/dL — ABNORMAL HIGH (ref 0.44–1.00)
GFR calc Af Amer: 15 mL/min — ABNORMAL LOW (ref >60)
GFR, EST NON AFRICAN AMERICAN: 13 mL/min — AB (ref >60)
Glucose, Bld: 82 mg/dL (ref 65–99)
Potassium: 3.9 mmol/L (ref 3.5–5.1)
Sodium: 132 mmol/L — ABNORMAL LOW (ref 135–145)

## 2015-01-29 LAB — GLUCOSE, CAPILLARY
GLUCOSE-CAPILLARY: 104 mg/dL — AB (ref 65–99)
Glucose-Capillary: 84 mg/dL (ref 65–99)
Glucose-Capillary: 130 mg/dL — ABNORMAL HIGH (ref 65–99)
Glucose-Capillary: 112 mg/dL — ABNORMAL HIGH (ref 65–99)

## 2015-01-29 LAB — PROTIME-INR
INR: 2.74 — AB (ref 0.00–1.49)
PROTHROMBIN TIME: 28.6 s — AB (ref 11.6–15.2)

## 2015-01-29 MED ORDER — WARFARIN SODIUM 1 MG PO TABS
1.0000 mg | ORAL_TABLET | Freq: Once | ORAL | Status: AC
  Administered 2015-01-29: 1 mg via ORAL
  Filled 2015-01-29: qty 1

## 2015-01-29 MED ORDER — SODIUM BICARBONATE 650 MG PO TABS
650.0000 mg | ORAL_TABLET | Freq: Two times a day (BID) | ORAL | Status: DC
  Administered 2015-01-29 – 2015-02-02 (×8): 650 mg via ORAL
  Filled 2015-01-29 (×9): qty 1

## 2015-01-29 MED ORDER — FUROSEMIDE 10 MG/ML IJ SOLN
120.0000 mg | Freq: Two times a day (BID) | INTRAVENOUS | Status: AC
  Administered 2015-01-29 (×2): 120 mg via INTRAVENOUS
  Filled 2015-01-29 (×2): qty 12

## 2015-01-29 MED ORDER — FLUCONAZOLE 100 MG PO TABS
100.0000 mg | ORAL_TABLET | Freq: Every day | ORAL | Status: DC
  Administered 2015-01-29 – 2015-02-02 (×5): 100 mg via ORAL
  Filled 2015-01-29 (×6): qty 1

## 2015-01-29 NOTE — Progress Notes (Signed)
VSS - Blood pressure 108/49, pulse 68, temperature 98.6 F (37 C), temperature source Oral, resp. rate 16, height 5' 2.4" (1.585 m), weight 91.899 kg (202 lb 9.6 oz), SpO2 98 %., O2   @ 2L.  Text paged Dr. Eliseo Squires, will await for her response.  Alphonzo Lemmings, RN

## 2015-01-29 NOTE — Progress Notes (Signed)
ANTICOAGULATION CONSULT NOTE - Follow Up Consult  Pharmacy Consult for Coumadin Indication: LV thrombus  Allergies  Allergen Reactions  . Adhesive [Tape] Other (See Comments)    Blisters   . Avelox [Moxifloxacin Hcl In Nacl] Other (See Comments)    GI upset  . Codeine Other (See Comments)    "crazy"   . Guaifenesin Nausea And Vomiting    Takes Mucinex at home without issue  . Latex Swelling  . Oxycodone Nausea And Vomiting    Takes Percocet at home without issue    Patient Measurements: Height: 5' 2.4" (158.5 cm) Weight: 202 lb 9.6 oz (91.899 kg) IBW/kg (Calculated) : 51.02  Vital Signs: Temp: 98.6 F (37 C) (06/29 0544) Temp Source: Oral (06/29 0544) BP: 108/49 mmHg (06/29 1000) Pulse Rate: 68 (06/29 1000)  Labs:  Recent Labs  01/27/15 0530 01/27/15 0930 01/28/15 0815 01/29/15 0444  HGB 12.2  --  12.4  --   HCT 37.2  --  37.3  --   PLT 401*  --  430*  --   LABPROT 22.8*  --  27.7* 28.6*  INR 2.02*  --  2.63* 2.74*  CREATININE  --  3.78* 3.55* 3.47*    Estimated Creatinine Clearance: 17 mL/min (by C-G formula based on Cr of 3.47).  Assessment: 68yof continues on coumadin for LV thrombus. INR is therapeutic at 2.74. She appears to be very sensitive to small dose changes in coumadin. No bleeding reported.  Goal of Therapy:  INR 2-3 Monitor platelets by anticoagulation protocol: Yes   Plan:  1) Coumadin 1mg  x 1 2) Daily INR  Deboraha Sprang 01/29/2015,11:15 AM

## 2015-01-29 NOTE — Progress Notes (Signed)
Assessment/Recommendations 1. 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 Will sign off. 2. NSVT - on low dose BB 3. LV thrombus on heparin->coumadin.  4. Biventricular systolic heart failure, diuretics per cardiology 5. Metabolic acidosis - Na bicarb. Goal CO2>20  Subjective: Interval History:Still with DOE  Objective: Vital signs in last 24 hours: Temp:  [98 F (36.7 C)-98.6 F (37 C)] 98 F (36.7 C) (06/29 1403) Pulse Rate:  [66-77] 77 (06/29 1403) Resp:  [15-20] 20 (06/29 1403) BP: (108-144)/(49-61) 130/51 mmHg (06/29 1403) SpO2:  [98 %-100 %] 100 % (06/29 1403) Weight:  [91.899 kg (202 lb 9.6 oz)] 91.899 kg (202 lb 9.6 oz) (06/29 0544) Weight change:   Intake/Output from previous day: 06/28 0701 - 06/29 0700 In: 660 [P.O.:660] Out: 1875 [Urine:1875] Intake/Output this shift: Total I/O In: 150 [P.O.:150] Out: 300 [Urine:300]  General appearance: alert and cooperative Resp: clear to auscultation bilaterally Chest wall: no tenderness Cardio: regular rate and rhythm, S1, S2 normal, no murmur, click, rub or gallop Extremities: edema tr  Lab Results:  Recent Labs  01/27/15 0530 01/28/15 0815  WBC 7.2 8.0  HGB 12.2 12.4  HCT 37.2 37.3  PLT 401* 430*   BMET:  Recent Labs  01/28/15 0815 01/29/15 0444  NA 130* 132*  K 3.8 3.9  CL 105 100*  CO2 17* 21*  GLUCOSE 96 82  BUN 32* 31*  CREATININE 3.55* 3.47*  CALCIUM 8.0* 8.1*   No results for input(s): PTH in the last 72 hours. Iron Studies: No results for input(s): IRON, TIBC, TRANSFERRIN, FERRITIN in the last 72 hours. Studies/Results: Dg Chest 2 View  01/28/2015   CLINICAL DATA:  CHF, history hypertension, diabetes mellitus, COPD, sarcoidosis, chronic kidney disease, coronary artery disease  EXAM: CHEST  2 VIEW  COMPARISON:  12/31/2014  FINDINGS: Enlargement of cardiac silhouette with pulmonary vascular congestion.  Minimal interstitial changes in the perihilar regions likely reflect  chronic failure.  No acute pulmonary edema, pleural effusion or pneumothorax.  Minimal atelectasis LEFT base.  Bones demineralized.  IMPRESSION: Enlargement of cardiac silhouette with pulmonary vascular congestion and minimal chronic accentuation of perihilar markings likely related to chronic failure.  Mild LEFT basilar atelectasis.   Electronically Signed   By: Lavonia Dana M.D.   On: 01/28/2015 17:38    Scheduled: . antiseptic oral rinse  7 mL Mouth Rinse BID  . aspirin EC  81 mg Oral Daily  . atorvastatin  20 mg Oral QHS  . carvedilol  6.25 mg Oral BID WC  . famotidine  20 mg Oral Daily  . fluconazole  100 mg Oral Daily  . furosemide  120 mg Intravenous BID  . insulin aspart  0-5 Units Subcutaneous QHS  . insulin aspart  0-9 Units Subcutaneous TID WC  . isosorbide-hydrALAZINE  2 tablet Oral TID  . loperamide  2 mg Oral BID  . mometasone-formoterol  2 puff Inhalation BID  . saccharomyces boulardii  250 mg Oral BID  . sodium bicarbonate  1,300 mg Oral BID  . sodium chloride  3 mL Intravenous Q12H  . sodium chloride  3 mL Intravenous Q12H  . sodium chloride  3 mL Intravenous Q12H  . warfarin  1 mg Oral ONCE-1800  . Warfarin - Pharmacist Dosing Inpatient   Does not apply q1800      LOS: 9 days   Rhealyn Cullen C 01/29/2015,2:28 PM

## 2015-01-29 NOTE — Progress Notes (Signed)
[facility] PROGRESS NOTE  [name] WUJ:[id number] DOB: [birth year] DOA: 01/20/2015 PCP: Antony Blackbird, MD  [name] is a 69 y.o. female with Past medical history of COPD, coronary artery disease, GERD, gout, obesity, chronic diastolic CHF, obstructive sleep apnea, type 2 diabetes mellitus, essential hypertension, chronic kidney disease. The patient is presenting with complaints of abdominal pain as well as nausea vomiting and diarrhea on ongoing since last 2 months progressively worsening leading to weakness and a fall.  Patient has been seen by GI: no further work up, cardiology: stress test 6/26 and nephrology:    Assessment/Plan: nausea, vomiting, diarrhea and abd pain: with inability to keep things down -patient has been PPI for the last 2-3 weeks w/o significant improvement; she has no half any endoscopic procedure in the last 5 years. -gi pathogen panel negative -due to elevated lactic acid, elevated procalcitonin and low grade temp; started on sepsis order with vanc, zosyn and flagyl. patient no longer septic in appearance and no clear source of infection identified. Antibiotic hves been discontinued. Patient's symptoms could be explained by NSTEMI and severe dehydration. -GI: Since she appears resolved and no bleeding cannot make a good case for endoscopic evaluation at this time  dehydration, hypokalemia and hypomagnesemia: -repleated  Acute on chronic CHF (combined disease): EF 30-35% and NSTEMI -troponin elevated -big decrease in EF, with hypokinesis and apical thrombus -cardiology consulted and on board; coumadin for 2 months and repeat echo -daily weights -strict intake and output -Not a candidate for ACE/ARB secondary to chronic kidney disease -Started on carvedilol, imdur and hydralazine -Myoview 6/26, RHC 6/27 -still diuresing with IV Lasix- cr improving  essential HTN: -BP overall stable -will continue current meds  acute on CKD stage 4: will  monitor Cr trend -renal following Cr improving with diuresis  chronic resp failure due to COPD: will continue oxygen supplementation- wears 2 L at home -no wheezing -will continue home bronchodilators regimen  diabetes with nephropathy: continue SSI -holding oral hypoglycemic agents  metabolic acidosis - bicarb -most likely associated with renal failure and ongoing diarrhea -Renal service has been consulted and will follow their recommendations.   Code Status: Partial (no intubation) Family Communication: patient Disposition Plan:    Consultants:  GI ([facility])  Cardiology   Renal service  Procedures:  See below for x-ray reports   2-D echo Systolic function was moderately to severely reduced. The estimated ejection fraction was in the range of 30% to 35%. Wall motion was normal; there were no regional wall motion abnormalities. Doppler parameters are consistent with abnormal left ventricular relaxation (grade 1 diastolic dysfunction). Acoustic contrast opacification revealed a medium-sized, 2.2 cm (L) x 1.2 cm (W), apicalthrombusassociated with a hypokinetic segment. - Mitral valve: There was mild regurgitation. - Right ventricle: Systolic function was moderately reduced. - Pulmonary arteries: Systolic pressure was moderately increased. PA peak pressure: 62 mm Hg (S). - Pericardium, extracardiac: A trivial pericardial effusion was identified posterior to the heart.  Antibiotics:  Vanc 6/21>>6/23  Zosyn 6/21>>6/23  Flagyl stop on 6/22  HPI/Subjective: Not eating much-- wants a roast beef sandwich  Objective: Filed Vitals:   01/29/15 1000  BP: 108/49  Pulse: 68  Temp:   Resp:     Intake/Output Summary (Last 24 hours) at 01/29/15 1220 Last data filed at 01/29/15 0323  Gross per 24 hour  Intake    420 ml  Output   1675 ml  Net  -1255 ml   Filed Weights   01/27/15 0443  01/28/15 0704 01/29/15 0544  Weight: 94.4 kg (208 lb 1.8  oz) 91.6 kg (201 lb 15.1 oz) 91.899 kg (202 lb 9.6 oz)    Exam:   General:  NAD  Cardiovascular: S1 and S2, no rubs or gallops, no JVD  Respiratory: good air movement, no wheezing  Abdomen: soft, +BS  Musculoskeletal: trace edema bilaterally, no cyanosis   Data Reviewed: Basic Metabolic Panel:  Recent Labs Lab 01/24/15 0549 01/25/15 0638 01/26/15 0320 01/27/15 0930 01/28/15 0815 01/29/15 0444  NA 133* 133* 132* 133* 130* 132*  K 3.9 3.7 3.8 4.0 3.8 3.9  CL 105 107 105 107 105 100*  CO2 18* 18* 19* 18* 17* 21*  GLUCOSE 93 90 108* 86 96 82  BUN 22* 25* 28* 29* 32* 31*  CREATININE 3.73* 3.89* 4.02* 3.78* 3.55* 3.47*  CALCIUM 8.5* 8.5* 8.2* 7.9* 8.0* 8.1*  PHOS 4.6 4.8* 5.0*  --   --   --    Liver Function Tests:  Recent Labs Lab 01/24/15 0549 01/25/15 0638 01/26/15 0320  ALBUMIN 1.6* 1.5* 1.5*   No results for input(s): LIPASE, AMYLASE in the last 168 hours. CBC:  Recent Labs Lab 01/24/15 0549 01/25/15 0638 01/26/15 0320 01/27/15 0530 01/28/15 0815  WBC 7.5 6.8 7.2 7.2 8.0  HGB 13.0 12.3 12.1 12.2 12.4  HCT 39.6 37.6 36.5 37.2 37.3  MCV 87.0 87.0 86.5 87.3 86.5  PLT 379 351 379 401* 430*   Cardiac Enzymes:  Recent Labs Lab 01/23/15 1122 01/23/15 1600 01/23/15 2228  TROPONINI 2.77* 3.17* 2.54*   BNP (last 3 results)  Recent Labs  09/02/14 0510 09/03/14 0428 01/21/15 1830  BNP 1159.9* 1107.3* 1056.8*    ProBNP (last 3 results)  Recent Labs  04/26/14 2358 06/24/14 0945  PROBNP 3725.0* 324.0*    CBG:  Recent Labs Lab 01/28/15 0810 01/28/15 1154 01/28/15 1706 01/29/15 0822 01/29/15 1204  GLUCAP 111* 141* 123* 84 130*    Studies: Dg Chest 2 View  01/28/2015   CLINICAL DATA:  CHF, history hypertension, diabetes mellitus, COPD, sarcoidosis, chronic kidney disease, coronary artery disease  EXAM: CHEST  2 VIEW  COMPARISON:  12/31/2014  FINDINGS: Enlargement of cardiac silhouette with pulmonary vascular congestion.  Minimal  interstitial changes in the perihilar regions likely reflect chronic failure.  No acute pulmonary edema, pleural effusion or pneumothorax.  Minimal atelectasis LEFT base.  Bones demineralized.  IMPRESSION: Enlargement of cardiac silhouette with pulmonary vascular congestion and minimal chronic accentuation of perihilar markings likely related to chronic failure.  Mild LEFT basilar atelectasis.   Electronically Signed   By: Lavonia Dana M.D.   On: 01/28/2015 17:38    Scheduled Meds: . antiseptic oral rinse  7 mL Mouth Rinse BID  . aspirin EC  81 mg Oral Daily  . atorvastatin  20 mg Oral QHS  . carvedilol  6.25 mg Oral BID WC  . famotidine  20 mg Oral Daily  . furosemide  120 mg Intravenous BID  . insulin aspart  0-5 Units Subcutaneous QHS  . insulin aspart  0-9 Units Subcutaneous TID WC  . isosorbide-hydrALAZINE  2 tablet Oral TID  . loperamide  2 mg Oral BID  . mometasone-formoterol  2 puff Inhalation BID  . saccharomyces boulardii  250 mg Oral BID  . sodium bicarbonate  1,300 mg Oral BID  . sodium chloride  3 mL Intravenous Q12H  . sodium chloride  3 mL Intravenous Q12H  . sodium chloride  3 mL Intravenous Q12H  . warfarin  1 mg Oral ONCE-1800  . Warfarin - Pharmacist Dosing Inpatient   Does not apply q1800   Continuous Infusions: . sodium chloride 20 mL/hr at 01/24/15 1044  . sodium chloride      Principal Problem:   Lactic acidosis Active Problems:   Obesity   Essential hypertension   GERD   Psoriasis   NAUSEA AND VOMITING   Chronic diastolic heart failure   Diabetes mellitus with renal manifestation   Anemia, iron deficiency   Diarrhea   Sepsis   Protein-calorie malnutrition, severe   NSTEMI (non-ST elevated myocardial infarction)   Mural thrombus of cardiac apex   Acute renal failure superimposed on stage 4 chronic kidney disease   Hypokalemia   DCM (dilated cardiomyopathy)    Time spent: 25 minutes    Karuna Balducci  [facility] Pager [phone]. If  7PM-7AM, please contact night-coverage at [email or web address], password [facility] 01/29/2015, 12:20 PM  LOS: 9 days

## 2015-01-29 NOTE — Progress Notes (Addendum)
Patient ID: [name], female   DOB: Jul 20, 1946, 69 y.o.   MRN: [id number]   69 yo with history of chronic diastolic CHF (has been seen in CHF clinic), HTN, DM, CKD IV, OHS/OSA, sarcoidosis followed by Dr Lenna Gilford was admitted with abdominal pain/weakness/nausea/vomiting and increased lactate. She developed AKI with rise in creatinine up to 4, followed by nephrology.  She was also noted to have fall in EF by echo, now 30-35% with apical hypokinesis and an apical thrombus, moderate RV dysfunction with PASP 68.  GI symptoms not fully understood and reason for worsening creatinine not fully understood.   Cardiolite 6/26 with no definite ischemia or infarction, EF 47% (?accuracy of EF).   Currently denies dyspnea, comfortable in bed.  Lasix 120 mg IV yesterday, reasonable UOP and creatinine slowly coming down.   RHC Procedural Findings (6/29): Hemodynamics (mmHg) RA mean 13 RV 74/15 PA 66/26, mean 39 PCWP mean 16 Oxygen saturations: PA 64% AO 97% Cardiac Output (Fick) 4.71  Cardiac Index (Fick) 2.43 PVR 4.9 WU Cardiac Output (Thermo) 4.82 Cardiac Index (Thermo) 2.48  PVR 4.8 WU  Scheduled Meds: . antiseptic oral rinse  7 mL Mouth Rinse BID  . aspirin EC  81 mg Oral Daily  . atorvastatin  20 mg Oral QHS  . carvedilol  6.25 mg Oral BID WC  . famotidine  20 mg Oral Daily  . furosemide  120 mg Intravenous BID  . insulin aspart  0-5 Units Subcutaneous QHS  . insulin aspart  0-9 Units Subcutaneous TID WC  . isosorbide-hydrALAZINE  2 tablet Oral TID  . loperamide  2 mg Oral BID  . mometasone-formoterol  2 puff Inhalation BID  . saccharomyces boulardii  250 mg Oral BID  . sodium bicarbonate  1,300 mg Oral BID  . sodium chloride  3 mL Intravenous Q12H  . sodium chloride  3 mL Intravenous Q12H  . sodium chloride  3 mL Intravenous Q12H  . Warfarin - Pharmacist Dosing Inpatient   Does not apply q1800   Continuous Infusions: . sodium chloride 20 mL/hr at 01/24/15 1044  . sodium  chloride     PRN Meds:.sodium chloride, sodium chloride, sodium chloride, acetaminophen **OR** acetaminophen, albuterol, fluticasone, gi cocktail, HYDROcodone-acetaminophen, ondansetron **OR** ondansetron (ZOFRAN) IV, sodium chloride, sodium chloride   Filed Vitals:   01/28/15 2129 01/28/15 2204 01/29/15 0544 01/29/15 0812  BP: 119/52  114/54   Pulse: 69 68 74   Temp: 98.4 F (36.9 C)  98.6 F (37 C)   TempSrc: Oral  Oral   Resp: 15 18 16    Height:      Weight:   202 lb 9.6 oz (91.899 kg)   SpO2: 99% 99% 100% 98%    Intake/Output Summary (Last 24 hours) at 01/29/15 0819 Last data filed at 01/29/15 0323  Gross per 24 hour  Intake    660 ml  Output   1875 ml  Net  -1215 ml    LABS: Basic Metabolic Panel:  Recent Labs  01/28/15 0815 01/29/15 0444  NA 130* 132*  K 3.8 3.9  CL 105 100*  CO2 17* 21*  GLUCOSE 96 82  BUN 32* 31*  CREATININE 3.55* 3.47*  CALCIUM 8.0* 8.1*   Liver Function Tests: No results for input(s): AST, ALT, ALKPHOS, BILITOT, PROT, ALBUMIN in the last 72 hours. No results for input(s): LIPASE, AMYLASE in the last 72 hours. CBC:  Recent Labs  01/27/15 0530 01/28/15 0815  WBC 7.2 8.0  HGB 12.2 12.4  HCT 37.2 37.3  MCV 87.3 86.5  PLT 401* 430*   Cardiac Enzymes: No results for input(s): CKTOTAL, CKMB, CKMBINDEX, TROPONINI in the last 72 hours. BNP: Invalid input(s): POCBNP D-Dimer: No results for input(s): DDIMER in the last 72 hours. Hemoglobin A1C: No results for input(s): HGBA1C in the last 72 hours. Fasting Lipid Panel: No results for input(s): CHOL, HDL, LDLCALC, TRIG, CHOLHDL, LDLDIRECT in the last 72 hours. Thyroid Function Tests: No results for input(s): TSH, T4TOTAL, T3FREE, THYROIDAB in the last 72 hours.  Invalid input(s): FREET3 Anemia Panel: No results for input(s): VITAMINB12, FOLATE, FERRITIN, TIBC, IRON, RETICCTPCT in the last 72 hours.  RADIOLOGY: Ct Abdomen Pelvis Wo Contrast  01/20/2015   CLINICAL DATA:   69 year old female with 3-4 week history of nausea, vomiting, diarrhea, weakness and abdominal pain while eating. History of trauma from a fall today.  EXAM: CT ABDOMEN AND PELVIS WITHOUT CONTRAST  TECHNIQUE: Multidetector CT imaging of the abdomen and pelvis was performed following the standard protocol without IV contrast.  COMPARISON:  CT of the abdomen and pelvis 12/21/2014.  FINDINGS: Lower chest: Mild cylindrical bronchiectasis, thickening of the peribronchovascular interstitium and architectural distortion in the lower lobes of the lungs bilaterally, which could be post infectious, or could be related to recurrent aspiration. Mild cardiomegaly. Atherosclerotic calcifications in the left main and left anterior descending coronary artery.  Hepatobiliary: Status post cholecystectomy. Irregular area of low attenuation and calcification in the posterior aspect of the right lobe of the liver is similar to remote prior studies dating back to at least 08/25/2013, favored to be related to sequela of remote trauma. No new hepatic lesions.  Pancreas: No definite pancreatic mass or surrounding inflammatory changes on today's noncontrast CT examination.  Spleen: Status post splenectomy. Multiple small splenules noted in the left side of the abdomen.  Adrenals/Urinary Tract: The unenhanced appearance of the adrenal glands bilaterally and the right kidney is normal. Mild scarring along the lateral aspect of the upper pole of the left kidney, likely related to prior splenectomy. Left kidney is otherwise normal in appearance. No hydroureteronephrosis. Urinary bladder is normal in appearance.  Stomach/Bowel: The unenhanced appearance of the stomach is normal. No pathologic dilatation of small bowel or colon. A few scattered colonic diverticulae are noted, without surrounding inflammatory changes to suggest an acute diverticulitis at this time.  Vascular/Lymphatic: Moderate atherosclerosis throughout the abdominal and pelvic  vasculature, without definite aneurysm. No lymphadenopathy noted in the abdomen or pelvis on today's noncontrast CT examination.  Reproductive: Status post hysterectomy.  Ovaries are atrophic.  Other: No significant volume of ascites.  No pneumoperitoneum.  Musculoskeletal: There are no aggressive appearing lytic or blastic lesions noted in the visualized portions of the skeleton.  IMPRESSION: 1. No acute findings in the abdomen or pelvis on today's noncontrast CT examination to account for the patient's symptoms. 2. The appearance of the lower lobes of the lungs could suggest post infectious scarring, but could also be seen in the setting of recurrent aspiration. Clinical correlation is suggested. 3. Mild cardiomegaly. 4. Atherosclerosis, including left anterior descending coronary artery disease. Please note that although the presence of coronary artery calcium documents the presence of coronary artery disease, the severity of this disease and any potential stenosis cannot be assessed on this non-gated CT examination. Assessment for potential risk factor modification, dietary therapy or pharmacologic therapy may be warranted, if clinically indicated. 5. Status post splenectomy. Multiple splenules are noted throughout the left side of the abdomen. 6. Status post cholecystectomy.  7. Additional incidental findings, as above.   Electronically Signed   By: Vinnie Langton M.D.   On: 01/20/2015 19:38   Dg Chest 2 View  01/28/2015   CLINICAL DATA:  CHF, history hypertension, diabetes mellitus, COPD, sarcoidosis, chronic kidney disease, coronary artery disease  EXAM: CHEST  2 VIEW  COMPARISON:  12/31/2014  FINDINGS: Enlargement of cardiac silhouette with pulmonary vascular congestion.  Minimal interstitial changes in the perihilar regions likely reflect chronic failure.  No acute pulmonary edema, pleural effusion or pneumothorax.  Minimal atelectasis LEFT base.  Bones demineralized.  IMPRESSION: Enlargement of cardiac  silhouette with pulmonary vascular congestion and minimal chronic accentuation of perihilar markings likely related to chronic failure.  Mild LEFT basilar atelectasis.   Electronically Signed   By: Lavonia Dana M.D.   On: 01/28/2015 17:38   Nm Gastric Emptying  01/02/2015   CLINICAL DATA:  Severe abdominal pain with nausea and vomiting. Abdominal bloating.  EXAM: NUCLEAR MEDICINE GASTRIC EMPTYING SCAN  TECHNIQUE: After oral ingestion of radiolabeled meal, sequential abdominal images were obtained for 4 hours. Percentage of activity emptying the stomach was calculated at 1 hour, 2 hour, 3 hour, and 4 hours.  RADIOPHARMACEUTICALS:  2.0 mCi Technetium-5m labeled sulfur colloid orally cooked with one egg with 8 oz of ginger ale.  COMPARISON:  None.  FINDINGS: Expected location of the stomach in the left upper quadrant. Ingested meal empties the stomach gradually over the course of the study.  40.3% emptied at 1 hr ( normal >= 10%)  76.85% emptied at 2 hr ( normal >= 40%)  93.18% emptied at 3 hr ( normal >= 70%)  98.25% emptied at 4 hr ( normal >= 90%)  IMPRESSION: Normal gastric emptying study.   Electronically Signed   By: Lorriane Shire M.D.   On: 01/02/2015 15:04   US Renal  01/24/2015   CLINICAL DATA:  Acute renal failure, history essential benign hypertension, coronary artery disease, COPD, type II diabetes mellitus, sarcoidosis, stage IV chronic kidney disease  EXAM: RENAL / URINARY TRACT ULTRASOUND COMPLETE  COMPARISON:  CT abdomen and pelvis 01/20/2015  FINDINGS: Degradation of image quality secondary to body habitus.  Right Kidney:  Length: 9.2 cm. Grossly normal cortical thickness and echogenicity. No definite mass, hydronephrosis or shadowing calcification.  Left Kidney:  Length: 10.0 cm. Grossly normal cortical thickness and echogenicity. No definite mass, hydronephrosis or shadowing calcification.  Bladder:  Partially distended, grossly unremarkable.  IMPRESSION: No acute abnormalities.    Electronically Signed   By: Lavonia Dana M.D.   On: 01/24/2015 16:54   Nm Myocar Multi W/spect W/wall Motion / Ef  01/26/2015    There was no ST segment deviation noted during stress.  No T wave inversion was noted during stress.  Defect 1: There is a defect of mild severity present in the mid  inferolateral location.  The study is normal.  This is an intermediate risk study.  The left ventricular ejection fraction is mildly decreased (45-54%).  Nuclear stress EF: 47%.    Dg Abd Acute W/chest  12/31/2014   CLINICAL DATA:  Nausea, vomiting and diarrhea for 1 week. Diverticulitis diagnosed approximately 2 weeks ago  EXAM: DG ABDOMEN ACUTE W/ 1V CHEST  COMPARISON:  Radiographs and CT 12/21/2014.  FINDINGS: Borderline cardiomegaly and vascular congestion are again noted, similar to the prior study. There is no confluent airspace opacity or significant pleural effusion.  The bowel gas pattern appears normal. There is no free intraperitoneal air or suspicious abdominal  calcification. Scattered vascular calcifications are noted. There are surgical clips in the upper abdomen bilaterally. Mild lumbar spine degenerative changes are stable.  IMPRESSION: 1. Stable radiographs of the chest and abdomen. No acute abdominal findings identified. 2. Stable borderline cardiomegaly and vascular congestion.   Electronically Signed   By: Richardean Sale M.D.   On: 12/31/2014 09:01    PHYSICAL EXAM General: NAD Neck: JVP 9-10, no thyromegaly or thyroid nodule.  Lungs: Clear to auscultation bilaterally with normal respiratory effort. CV: Nondisplaced PMI.  Heart regular S1/S2, wide split S2, no S3/S4, 1/6 SEM apex.  Trace ankle edema.  No carotid bruit.  Normal pedal pulses.  Abdomen: Soft, mild diffuse tenderness, no hepatosplenomegaly, no distention.  Neurologic: Alert and oriented x 3.  Psych: Normal affect. Extremities: No clubbing or cyanosis. Right hand swelling.   TELEMETRY: Reviewed telemetry pt in  [facility]  ASSESSMENT AND PLAN: 69 yo with history of chronic diastolic CHF (has been seen in CHF clinic), HTN, DM, CKD IV, OHS/OSA, sarcoidosis followed by Dr Lenna Gilford was admitted with abdominal pain/weakness/nausea/vomiting and increased lactate. She developed AKI with rise in creatinine up to 4, followed by nephrology.  She was also noted to have fall in EF by echo, now 30-35% with apical hypokinesis and an apical thrombus.  1. Cardiomyopathy: New fall in EF to 30-35% with apical hypokinesis and apical thrombus.  She has prior history of CAD (without significant disease in major vessels, however).  Suspect ischemic cardiomyopathy with MI involving the apex in the past, but Cardiolite this admission actually showed no definitive ischemia or prior infarction.  She has had no recent chest pain. RHC showed preserved cardiac output with moderately elevated RV filling pressure, mildly elevated LV filling pressure, and moderate pulmonary hypertension. - Continue Coreg and Bidil.    - She still looks volume overloaded.  Will give Lasix 120 mg IV bid x 2 doses today and follow closely.  2. CAD: Known from the past.  Still suspect MI as cause of EF fall with apical thrombus.  No chest pain, no ischemia on Cardiolite.  Not candidate for coronary angiography at this point with AKI on CKD.  Continue ASA, statin.  3. Abdominal pain/elevated lactate: Cause still unclear. No definite etiology from abdominal CT.  ?mesenteric embolism from LV thrombus.  Still some tenderness but much improved.  4. AKI on CKD IV: Unsure etiology of worsening.  Cardiac output preserved by RHC.  Creatinine slowly improving.  5. 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  Suspect there is a component of group 3 PAH from OHS/OSA.  Sarcoidosis may also play a role in her PAH, but not sure how active this actually is.  For now, would treat with oxygen and CPAP for OHS/OSA.  Could consider ERA down the road if  sarcoidosis is thought to play a major role in her [place].   6. LV thrombus: On warfarin, therapeutic INR.   Loralie Champagne 01/29/2015 8:19 AM

## 2015-01-29 NOTE — Progress Notes (Signed)
Physical Therapy Treatment Patient Details Name: [name] MRN: [id number] DOB: October 05, 1945 Today's Date: 01/29/2015    History of Present Illness Pt is a 69 y/o female presenting with abdominal pain, N/V/D, and fall. Troponin elevated and pt was found to have NSTEMI - 2D echo revealed LV thrombus.     PT Comments    Subjectively, pt reports feeling better today with less abdominal pain, however, pt fatigued very quickly with mobility and could tolerate only 30' of ambulation with RW and min A. Pt with increased DOE and anxiety, though O2 sats remained above 90%. PT will continue to follow.   Follow Up Recommendations  Home health PT;Supervision/Assistance - 24 hour     Equipment Recommendations  None recommended by PT    Recommendations for Other Services       Precautions / Restrictions Precautions Precautions: Fall Restrictions Weight Bearing Restrictions: No    Mobility  Bed Mobility Overal bed mobility: Modified Independent Bed Mobility: Supine to Sit     Supine to sit: Modified independent (Device/Increase time)     General bed mobility comments: pt safe with supine to sit  Transfers Overall transfer level: Needs assistance Equipment used: Rolling walker (2 wheeled) Transfers: Sit to/from Stand Sit to Stand: Min assist;Mod assist         General transfer comment: pt feeling very fatigued today once she began moving. Required min A for power up from bed and mod A from low toilet.   Ambulation/Gait Ambulation/Gait assistance: Min assist Ambulation Distance (Feet): 3 Feet Assistive device: Rolling walker (2 wheeled) Gait Pattern/deviations: Step-to pattern;Decreased stride length;Trunk flexed Gait velocity: very slow Gait velocity interpretation: <1.8 ft/sec, indicative of risk for recurrent falls General Gait Details: Pt very fatigued with activity and could only tolerate 30' of ambulation, min A for safety as pt was quite anxious due to fatigue.  O2 sats remained low 90s and HR 83 bpm with ambulation on RA. cues for deep breathing and calm.   Stairs            Wheelchair Mobility    Modified Rankin (Stroke Patients Only)       Balance Overall balance assessment: Needs assistance Sitting-balance support: No upper extremity supported Sitting balance-Leahy Scale: Good     Standing balance support: Single extremity supported Standing balance-Leahy Scale: Poor Standing balance comment: pt unable to stand without UE support today. At sink, pt required one elbow on sink at all times and DOE 2/4.                     Cognition Arousal/Alertness: Awake/alert Behavior During Therapy: WFL for tasks assessed/performed Overall Cognitive Status: Within Functional Limits for tasks assessed                      Exercises      General Comments General comments (skin integrity, edema, etc.): Despite O2 sats in 90's, 2L O2 reapplied after session since pt was symptomatically SOB      Pertinent Vitals/Pain Pain Assessment: No/denies pain    Home Living                      Prior Function            PT Goals (current goals can now be found in the care plan section) Acute Rehab PT Goals Patient Stated Goal: Return home PT Goal Formulation: With patient Time For Goal Achievement: 01/31/15 Potential to Achieve Goals: Good  Progress towards PT goals: Progressing toward goals    Frequency  Min 3X/week    PT Plan Current plan remains appropriate    Co-evaluation             End of Session Equipment Utilized During Treatment: Gait belt;Oxygen Activity Tolerance: Patient limited by fatigue Patient left: in chair;with call bell/phone within reach     Time: 1159-1219 PT Time Calculation (min) (ACUTE ONLY): 20 min  Charges:  $Gait Training: 8-22 mins                    G Codes:     Leighton Roach, PT  Acute Rehab Services  [place], [place] 01/29/2015, 2:01 PM

## 2015-01-30 LAB — BASIC METABOLIC PANEL
Anion gap: 11 (ref 5–15)
BUN: 30 mg/dL — ABNORMAL HIGH (ref 6–20)
CALCIUM: 8.2 mg/dL — AB (ref 8.9–10.3)
CO2: 21 mmol/L — ABNORMAL LOW (ref 22–32)
Chloride: 100 mmol/L — ABNORMAL LOW (ref 101–111)
Creatinine, Ser: 3.27 mg/dL — ABNORMAL HIGH (ref 0.44–1.00)
GFR calc non Af Amer: 14 mL/min — ABNORMAL LOW (ref >60)
GFR, EST AFRICAN AMERICAN: 16 mL/min — AB (ref >60)
GLUCOSE: 81 mg/dL (ref 65–99)
Potassium: 3.9 mmol/L (ref 3.5–5.1)
SODIUM: 132 mmol/L — AB (ref 135–145)

## 2015-01-30 LAB — PROTIME-INR
INR: 2.8 — ABNORMAL HIGH (ref 0.00–1.49)
PROTHROMBIN TIME: 29.1 s — AB (ref 11.6–15.2)

## 2015-01-30 LAB — GLUCOSE, CAPILLARY
Glucose-Capillary: 80 mg/dL (ref 65–99)
Glucose-Capillary: 117 mg/dL — ABNORMAL HIGH (ref 65–99)
Glucose-Capillary: 109 mg/dL — ABNORMAL HIGH (ref 65–99)
Glucose-Capillary: 157 mg/dL — ABNORMAL HIGH (ref 65–99)

## 2015-01-30 LAB — TROPONIN I: TROPONIN I: 0.29 ng/mL — AB (ref <0.031)

## 2015-01-30 MED ORDER — FUROSEMIDE 10 MG/ML IJ SOLN
120.0000 mg | Freq: Two times a day (BID) | INTRAVENOUS | Status: AC
  Administered 2015-01-30 (×2): 120 mg via INTRAVENOUS
  Filled 2015-01-30 (×2): qty 12

## 2015-01-30 MED ORDER — METOLAZONE 2.5 MG PO TABS
2.5000 mg | ORAL_TABLET | Freq: Once | ORAL | Status: AC
  Administered 2015-01-30: 2.5 mg via ORAL
  Filled 2015-01-30: qty 1

## 2015-01-30 MED ORDER — CALCIUM CARBONATE ANTACID 500 MG PO CHEW
1.0000 | CHEWABLE_TABLET | Freq: Three times a day (TID) | ORAL | Status: DC
  Administered 2015-01-30 – 2015-02-02 (×8): 200 mg via ORAL
  Filled 2015-01-30 (×12): qty 1

## 2015-01-30 MED ORDER — CLOTRIMAZOLE 1 % VA CREA
1.0000 | TOPICAL_CREAM | Freq: Every day | VAGINAL | Status: DC
  Administered 2015-01-30: 1 via VAGINAL
  Filled 2015-01-30: qty 45

## 2015-01-30 MED ORDER — ALUM & MAG HYDROXIDE-SIMETH 200-200-20 MG/5ML PO SUSP
15.0000 mL | Freq: Once | ORAL | Status: AC
  Administered 2015-01-30: 15 mL via ORAL
  Filled 2015-01-30: qty 30

## 2015-01-30 MED ORDER — WARFARIN 0.5 MG HALF TABLET
0.5000 mg | ORAL_TABLET | Freq: Once | ORAL | Status: AC
  Administered 2015-01-30: 0.5 mg via ORAL
  Filled 2015-01-30: qty 1

## 2015-01-30 NOTE — Progress Notes (Signed)
ANTICOAGULATION CONSULT NOTE - Follow Up Consult  Pharmacy Consult for Coumadin Indication: LV thrombus  Allergies  Allergen Reactions  . Adhesive [Tape] Other (See Comments)    Blisters   . Avelox [Moxifloxacin Hcl In Nacl] Other (See Comments)    GI upset  . Codeine Other (See Comments)    "crazy"   . Guaifenesin Nausea And Vomiting    Takes Mucinex at home without issue  . Latex Swelling  . Oxycodone Nausea And Vomiting    Takes Percocet at home without issue    Patient Measurements: Height: 5' 2.4" (158.5 cm) Weight: 201 lb 1 oz (91.2 kg) IBW/kg (Calculated) : 51.02  Vital Signs: Temp: 98.7 F (37.1 C) (06/30 0538) Temp Source: Oral (06/30 0538) BP: 127/61 mmHg (06/30 0900) Pulse Rate: 83 (06/30 0900)  Labs:  Recent Labs  01/28/15 0815 01/29/15 0444 01/30/15 0405  HGB 12.4  --   --   HCT 37.3  --   --   PLT 430*  --   --   LABPROT 27.7* 28.6* 29.1*  INR 2.63* 2.74* 2.80*  CREATININE 3.55* 3.47* 3.27*    Estimated Creatinine Clearance: 17.9 mL/min (by C-G formula based on Cr of 3.27).  Assessment: 68yof continues on coumadin for LV thrombus. INR is therapeutic at 2.8. She appears to be very sensitive to small dose changes in coumadin. PO fluconazole added yesterday which can increase INR, so will lower coumadin dose tonight. No bleeding reported.  Goal of Therapy:  INR 2-3 Monitor platelets by anticoagulation protocol: Yes   Plan:  1) Coumadin 0.5mg  x 1 2) Daily INR  Deboraha Sprang 01/30/2015,9:44 AM

## 2015-01-30 NOTE — Progress Notes (Signed)
Advanced Heart Failure Rounding Note   Subjective:    69 yo with history of chronic diastolic CHF (has been seen in CHF clinic), HTN, DM, CKD IV, OHS/OSA, sarcoidosis followed by Dr Lenna Gilford was admitted with abdominal pain/weakness/nausea/vomiting and increased lactate. She developed AKI with rise in creatinine up to 4, followed by nephrology. She was also noted to have fall in EF by echo, now 30-35% with apical hypokinesis and an apical thrombus, moderate RV dysfunction with PASP 68. GI symptoms not fully understood and reason for worsening creatinine not fully understood.   Cardiolite 6/26 with no definite ischemia or infarction, EF 47% (?accuracy of EF).   No dyspnea currently. Lying in bed without difficulty. Lasix 120 mg IV BID yesterday, with 0.7 L of deficit and Cr continues to trend down.  Her ABD pain is much better.  Today, she states that she feels "swollen" in her hands and feet.   RHC Procedural Findings (6/29): Hemodynamics (mmHg) RA mean 13 RV 74/15 PA 66/26, mean 39 PCWP mean 16 Oxygen saturations: PA 64% AO 97% Cardiac Output (Fick) 4.71  Cardiac Index (Fick) 2.43 PVR 4.9 WU Cardiac Output (Thermo) 4.82 Cardiac Index (Thermo) 2.48  PVR 4.8 WU    Objective:   Weight Range: 201 lb 1 oz (91.2 kg) Body mass index is 36.3 kg/(m^2).   Vital Signs:   Temp:  [98 F (36.7 C)-99.1 F (37.3 C)] 98.7 F (37.1 C) (06/30 0538) Pulse Rate:  [66-83] 78 (06/30 0538) Resp:  [18-20] 20 (06/30 0538) BP: (105-144)/(48-61) 141/61 mmHg (06/30 0538) SpO2:  [96 %-100 %] 99 % (06/30 0538) Weight:  [201 lb 1 oz (91.2 kg)] 201 lb 1 oz (91.2 kg) (06/30 0646) Last BM Date: 01/29/15  Weight change: Filed Weights   01/28/15 0704 01/29/15 0544 01/30/15 0646  Weight: 201 lb 15.1 oz (91.6 kg) 202 lb 9.6 oz (91.899 kg) 201 lb 1 oz (91.2 kg)    Intake/Output:   Intake/Output Summary (Last 24 hours) at 01/30/15 0743 Last data filed at 01/30/15 0358  Gross per 24 hour  Intake     522 ml  Output   1300 ml  Net   -778 ml     Physical Exam General: NAD Neck: JVP 12, no thyromegaly or thyroid nodule.  Lungs: Clear to auscultation bilaterally with normal respiratory effort. CV: Nondisplaced PMI. Heart regular S1/S2, wide split S2, no S3/S4, 1/6 SEM apex. 1+ ankle edema. No carotid bruit. Normal pedal pulses.  Abdomen: Soft, non tender today, no hepatosplenomegaly, no distention.  Neurologic: Alert and oriented x 3.  Psych: Normal affect. Extremities: No clubbing or cyanosis. Right hand swelling.   TELEMETRY: NSR  Labs: CBC  Recent Labs  01/28/15 0815  WBC 8.0  HGB 12.4  HCT 37.3  MCV 86.5  PLT 161*   Basic Metabolic Panel  Recent Labs  01/29/15 0444 01/30/15 0405  NA 132* 132*  K 3.9 3.9  CL 100* 100*  CO2 21* 21*  GLUCOSE 82 81  BUN 31* 30*  CALCIUM 8.1* 8.2*   Liver Function Tests No results for input(s): AST, ALT, ALKPHOS, BILITOT, PROT, ALBUMIN in the last 72 hours. No results for input(s): LIPASE, AMYLASE in the last 72 hours. Cardiac Enzymes No results for input(s): CKTOTAL, CKMB, CKMBINDEX, TROPONINI in the last 72 hours.  BNP: BNP (last 3 results)  Recent Labs  09/02/14 0510 09/03/14 0428 01/21/15 1830  BNP 1159.9* 1107.3* 1056.8*    ProBNP (last 3 results)  Recent Labs  04/26/14  2358 06/24/14 0945  PROBNP 3725.0* 324.0*     D-Dimer No results for input(s): DDIMER in the last 72 hours. Hemoglobin A1C No results for input(s): HGBA1C in the last 72 hours. Fasting Lipid Panel No results for input(s): CHOL, HDL, LDLCALC, TRIG, CHOLHDL, LDLDIRECT in the last 72 hours. Thyroid Function Tests No results for input(s): TSH, T4TOTAL, T3FREE, THYROIDAB in the last 72 hours.  Invalid input(s): FREET3  Other results:     Imaging/Studies:  Dg Chest 2 View  01/28/2015   CLINICAL DATA:  CHF, history hypertension, diabetes mellitus, COPD, sarcoidosis, chronic kidney disease, coronary artery disease  EXAM:  CHEST  2 VIEW  COMPARISON:  12/31/2014  FINDINGS: Enlargement of cardiac silhouette with pulmonary vascular congestion.  Minimal interstitial changes in the perihilar regions likely reflect chronic failure.  No acute pulmonary edema, pleural effusion or pneumothorax.  Minimal atelectasis LEFT base.  Bones demineralized.  IMPRESSION: Enlargement of cardiac silhouette with pulmonary vascular congestion and minimal chronic accentuation of perihilar markings likely related to chronic failure.  Mild LEFT basilar atelectasis.   Electronically Signed   By: Lavonia Dana M.D.   On: 01/28/2015 17:38     Latest Echo  Latest Cath   Medications:     Scheduled Medications: . antiseptic oral rinse  7 mL Mouth Rinse BID  . aspirin EC  81 mg Oral Daily  . atorvastatin  20 mg Oral QHS  . carvedilol  6.25 mg Oral BID WC  . famotidine  20 mg Oral Daily  . fluconazole  100 mg Oral Daily  . insulin aspart  0-5 Units Subcutaneous QHS  . insulin aspart  0-9 Units Subcutaneous TID WC  . isosorbide-hydrALAZINE  2 tablet Oral TID  . loperamide  2 mg Oral BID  . mometasone-formoterol  2 puff Inhalation BID  . saccharomyces boulardii  250 mg Oral BID  . sodium bicarbonate  650 mg Oral BID  . sodium chloride  3 mL Intravenous Q12H  . sodium chloride  3 mL Intravenous Q12H  . sodium chloride  3 mL Intravenous Q12H  . Warfarin - Pharmacist Dosing Inpatient   Does not apply q1800     Infusions: . sodium chloride 20 mL/hr at 01/24/15 1044  . sodium chloride Stopped (01/29/15 2305)     PRN Medications:  sodium chloride, sodium chloride, sodium chloride, acetaminophen **OR** acetaminophen, albuterol, fluticasone, gi cocktail, HYDROcodone-acetaminophen, ondansetron **OR** ondansetron (ZOFRAN) IV, sodium chloride, sodium chloride   Assessment/Plan   69 yo with history of chronic diastolic CHF (has been seen in CHF clinic), HTN, DM, CKD IV, OHS/OSA, sarcoidosis followed by Dr Lenna Gilford was admitted with abdominal  pain/weakness/nausea/vomiting and increased lactate. She developed AKI with rise in creatinine up to 4, followed by nephrology. She was also noted to have fall in EF by echo, now 30-35% with apical hypokinesis and an apical thrombus.  1. Cardiomyopathy: New fall in EF to 30-35% with apical hypokinesis and apical thrombus. She has prior history of CAD (without significant disease in major vessels, however). Suspect ischemic cardiomyopathy with MI involving the apex in the past, but Cardiolite this admission actually showed no definitive ischemia or prior infarction. She has had no recent chest pain. RHC showed preserved cardiac output with moderately elevated RV filling pressure, mildly elevated LV filling pressure, and moderate pulmonary hypertension. - Continue Coreg and Bidil.  - Received Lasix 120 mg IV bid x 2 doses today and follow closely, but only out 0.7 liter. Down 1 lb from last night. Will  consult MD for optimal dosing today. 2. CAD: Known from the past.  - Still suspect MI as cause of EF fall with apical thrombus. No chest pain, no ischemia on Cardiolite.  - Not candidate for coronary angiography at this point with AKI on CKD.  - Continue ASA, statin.  3. Abdominal pain/elevated lactate:  - Cause still unclear. No definite etiology from abdominal CT.  - ?mesenteric embolism from LV thrombus.  -  Not tender today. 4. AKI on CKD IV: Unsure etiology of worsening. Cardiac output preserved by RHC.  - Creatinine slowly improving. 3.55 -> 3.47 -> 3.27 5. 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 Suspect there is a component of group 3 PAH from OHS/OSA.  - Sarcoidosis may also play a role in her PAH, but not sure how active this actually is.  - For now, treat with 02 and CPAP for OHS/OSA.  - Could consider ERA down the road if sarcoidosis is thought to play a major role in her [place].  6. LV thrombus: On warfarin, therapeutic INR.    Length of Stay: 10  Shirley Friar PA-C 01/30/2015, 7:43 AM  Advanced Heart Failure Team Pager [phone] (M-F; 7a - 4p)  Please contact [facility] for night-coverage after hours (4p -7a ) and weekends on [email or web address]  Patient seen with PA, agree with the above note.  Mrs Henkin feels "swollen" today.  She continues to have volume overload on exam.  Creatinine is slowly coming down.  Would aim for additional diuresis today => will give dose of metolazone 2.5 with Lasix 120 mg IV bid.  If she diureses well with this today, may be able to go back to her home regimen of Lasix 160 mg po bid tomorrow.   INR remains therapeutic with LV thrombus.   Continue Coreg and Bidil for cardiomyopathy.   Loralie Champagne 01/30/2015 10:02 AM

## 2015-01-30 NOTE — Progress Notes (Signed)
[facility] PROGRESS NOTE  Lorette A Marijo File CWU:[id number] DOB: [birth year] DOA: 01/20/2015 PCP: Antony Blackbird, MD  [name] is a 69 y.o. female with Past medical history of COPD, coronary artery disease, GERD, gout, obesity, chronic diastolic CHF, obstructive sleep apnea, type 2 diabetes mellitus, essential hypertension, chronic kidney disease. The patient is presenting with complaints of abdominal pain as well as nausea vomiting and diarrhea on ongoing since last 2 months progressively worsening leading to weakness and a fall.  Patient has been seen by GI: no further work up, cardiology: stress test 6/26 and nephrology:    Assessment/Plan: nausea, vomiting, diarrhea and abd pain: with inability to keep things down -patient has been PPI for the last 2-3 weeks w/o significant improvement; she has no half any endoscopic procedure in the last 5 years. -gi pathogen panel negative -due to elevated lactic acid, elevated procalcitonin and low grade temp; started on sepsis order with vanc, zosyn and flagyl. patient no longer septic in appearance and no clear source of infection identified. Antibiotic hves been discontinued. Patient's symptoms could be explained by NSTEMI and severe dehydration. -GI: Since she appears resolved and no bleeding cannot make a good case for endoscopic evaluation at this time  dehydration, hypokalemia and hypomagnesemia: -repleated  Acute on chronic CHF (combined disease): EF 30-35% and NSTEMI -troponin elevated -big decrease in EF, with hypokinesis and apical thrombus -cardiology consulted and on board; coumadin for 2 months and repeat echo -daily weights -strict intake and output -Not a candidate for ACE/ARB secondary to chronic kidney disease -Started on carvedilol, imdur and hydralazine -Myoview 6/26, RHC 6/27 -still diuresing with IV Lasix- cr improving  essential HTN: -BP overall stable -will continue current meds  acute on CKD stage 4: will  monitor Cr trend -renal following Cr improving with diuresis  chronic resp failure due to COPD: will continue oxygen supplementation- wears 2 L at home -no wheezing -will continue home bronchodilators regimen  diabetes with nephropathy: continue SSI -holding oral hypoglycemic agents  metabolic acidosis - bicarb -most likely associated with renal failure and ongoing diarrhea -Renal service has been consulted and will follow their recommendations.  Yeast infection -diflucan -add topical  Code Status: Partial (no intubation) Family Communication: patient Disposition Plan:    Consultants:  GI ([facility])  Cardiology   Renal service  Procedures:  See below for x-ray reports   2-D echo Systolic function was moderately to severely reduced. The estimated ejection fraction was in the range of 30% to 35%. Wall motion was normal; there were no regional wall motion abnormalities. Doppler parameters are consistent with abnormal left ventricular relaxation (grade 1 diastolic dysfunction). Acoustic contrast opacification revealed a medium-sized, 2.2 cm (L) x 1.2 cm (W), apicalthrombusassociated with a hypokinetic segment. - Mitral valve: There was mild regurgitation. - Right ventricle: Systolic function was moderately reduced. - Pulmonary arteries: Systolic pressure was moderately increased. PA peak pressure: 62 mm Hg (S). - Pericardium, extracardiac: A trivial pericardial effusion was identified posterior to the heart.  Antibiotics:  Vanc 6/21>>6/23  Zosyn 6/21>>6/23  Flagyl stop on 6/22  HPI/Subjective: Not feeling great-- has itching in groin  Objective: Filed Vitals:   01/30/15 1042  BP: 130/68  Pulse: 68  Temp:   Resp:     Intake/Output Summary (Last 24 hours) at 01/30/15 1234 Last data filed at 01/30/15 0900  Gross per 24 hour  Intake    822 ml  Output   1600 ml  Net   -778 ml   Filed  Weights   01/28/15 0704 01/29/15 0544 01/30/15  0646  Weight: 91.6 kg (201 lb 15.1 oz) 91.899 kg (202 lb 9.6 oz) 91.2 kg (201 lb 1 oz)    Exam:   General:  NAD  Cardiovascular: S1 and S2, no rubs or gallops, no JVD  Respiratory: good air movement, no wheezing  Abdomen: soft, +BS  Musculoskeletal: trace edema bilaterally, no cyanosis   Data Reviewed: Basic Metabolic Panel:  Recent Labs Lab 01/24/15 0549 01/25/15 0638 01/26/15 0320 01/27/15 0930 01/28/15 0815 01/29/15 0444 01/30/15 0405  NA 133* 133* 132* 133* 130* 132* 132*  K 3.9 3.7 3.8 4.0 3.8 3.9 3.9  CL 105 107 105 107 105 100* 100*  CO2 18* 18* 19* 18* 17* 21* 21*  GLUCOSE 93 90 108* 86 96 82 81  BUN 22* 25* 28* 29* 32* 31* 30*  CREATININE 3.73* 3.89* 4.02* 3.78* 3.55* 3.47* 3.27*  CALCIUM 8.5* 8.5* 8.2* 7.9* 8.0* 8.1* 8.2*  PHOS 4.6 4.8* 5.0*  --   --   --   --    Liver Function Tests:  Recent Labs Lab 01/24/15 0549 01/25/15 0638 01/26/15 0320  ALBUMIN 1.6* 1.5* 1.5*   No results for input(s): LIPASE, AMYLASE in the last 168 hours. CBC:  Recent Labs Lab 01/24/15 0549 01/25/15 0638 01/26/15 0320 01/27/15 0530 01/28/15 0815  WBC 7.5 6.8 7.2 7.2 8.0  HGB 13.0 12.3 12.1 12.2 12.4  HCT 39.6 37.6 36.5 37.2 37.3  MCV 87.0 87.0 86.5 87.3 86.5  PLT 379 351 379 401* 430*   Cardiac Enzymes:  Recent Labs Lab 01/23/15 1600 01/23/15 2228  TROPONINI 3.17* 2.54*   BNP (last 3 results)  Recent Labs  09/02/14 0510 09/03/14 0428 01/21/15 1830  BNP 1159.9* 1107.3* 1056.8*    ProBNP (last 3 results)  Recent Labs  04/26/14 2358 06/24/14 0945  PROBNP 3725.0* 324.0*    CBG:  Recent Labs Lab 01/29/15 1204 01/29/15 1734 01/29/15 2222 01/30/15 0758 01/30/15 1215  GLUCAP 130* 112* 104* 80 117*    Studies: Dg Chest 2 View  01/28/2015   CLINICAL DATA:  CHF, history hypertension, diabetes mellitus, COPD, sarcoidosis, chronic kidney disease, coronary artery disease  EXAM: CHEST  2 VIEW  COMPARISON:  12/31/2014  FINDINGS: Enlargement of  cardiac silhouette with pulmonary vascular congestion.  Minimal interstitial changes in the perihilar regions likely reflect chronic failure.  No acute pulmonary edema, pleural effusion or pneumothorax.  Minimal atelectasis LEFT base.  Bones demineralized.  IMPRESSION: Enlargement of cardiac silhouette with pulmonary vascular congestion and minimal chronic accentuation of perihilar markings likely related to chronic failure.  Mild LEFT basilar atelectasis.   Electronically Signed   By: Lavonia Dana M.D.   On: 01/28/2015 17:38    Scheduled Meds: . antiseptic oral rinse  7 mL Mouth Rinse BID  . aspirin EC  81 mg Oral Daily  . atorvastatin  20 mg Oral QHS  . calcium carbonate  1 tablet Oral TID WC  . carvedilol  6.25 mg Oral BID WC  . famotidine  20 mg Oral Daily  . fluconazole  100 mg Oral Daily  . furosemide  120 mg Intravenous BID  . insulin aspart  0-5 Units Subcutaneous QHS  . insulin aspart  0-9 Units Subcutaneous TID WC  . isosorbide-hydrALAZINE  2 tablet Oral TID  . loperamide  2 mg Oral BID  . mometasone-formoterol  2 puff Inhalation BID  . saccharomyces boulardii  250 mg Oral BID  . sodium bicarbonate  650  mg Oral BID  . sodium chloride  3 mL Intravenous Q12H  . sodium chloride  3 mL Intravenous Q12H  . sodium chloride  3 mL Intravenous Q12H  . warfarin  0.5 mg Oral ONCE-1800  . Warfarin - Pharmacist Dosing Inpatient   Does not apply q1800   Continuous Infusions: . sodium chloride 20 mL/hr at 01/24/15 1044  . sodium chloride Stopped (01/29/15 2305)    Principal Problem:   Lactic acidosis Active Problems:   Obesity   Essential hypertension   GERD   Psoriasis   NAUSEA AND VOMITING   Chronic diastolic heart failure   Diabetes mellitus with renal manifestation   Anemia, iron deficiency   Diarrhea   Sepsis   Protein-calorie malnutrition, severe   NSTEMI (non-ST elevated myocardial infarction)   Mural thrombus of cardiac apex   Acute renal failure superimposed on stage 4  chronic kidney disease   Hypokalemia   DCM (dilated cardiomyopathy)    Time spent: 25 minutes    Maley Venezia  [facility] Pager [phone]. If 7PM-7AM, please contact night-coverage at [email or web address], password [facility] 01/30/2015, 12:34 PM  LOS: 10 days

## 2015-01-30 NOTE — Progress Notes (Signed)
Patient to go home with niece after discharge, and stay with her for a few weeks. Patient has used [place] in the past and would like to use them again if needed. Will follow for discharge needs.

## 2015-01-30 NOTE — Progress Notes (Signed)
PT Cancellation Note  Patient Details Name: [name] MRN: [id number] DOB: 08/18/1945   Cancelled Treatment:    Reason Eval/Treat Not Completed: Other (comment). Pt refused PT session this PM due to not feeling well and stated she couldn't get out of bed. Assisted pt off of bed pan and repositioned but still declined. Will follow up as able.   [name], PT, DPT Pager #: [phone]  01/30/2015, 3:00 PM

## 2015-01-31 ENCOUNTER — Encounter: Payer: Self-pay | Admitting: Vascular Surgery

## 2015-01-31 LAB — BASIC METABOLIC PANEL
Anion gap: 13 (ref 5–15)
BUN: 36 mg/dL — AB (ref 6–20)
CO2: 22 mmol/L (ref 22–32)
Calcium: 8.3 mg/dL — ABNORMAL LOW (ref 8.9–10.3)
Chloride: 97 mmol/L — ABNORMAL LOW (ref 101–111)
Creatinine, Ser: 3.16 mg/dL — ABNORMAL HIGH (ref 0.44–1.00)
GFR calc Af Amer: 17 mL/min — ABNORMAL LOW (ref >60)
GFR calc non Af Amer: 14 mL/min — ABNORMAL LOW (ref >60)
GLUCOSE: 89 mg/dL (ref 65–99)
Potassium: 4.5 mmol/L (ref 3.5–5.1)
Sodium: 132 mmol/L — ABNORMAL LOW (ref 135–145)

## 2015-01-31 LAB — GLUCOSE, CAPILLARY
GLUCOSE-CAPILLARY: 115 mg/dL — AB (ref 65–99)
Glucose-Capillary: 90 mg/dL (ref 65–99)
Glucose-Capillary: 174 mg/dL — ABNORMAL HIGH (ref 65–99)
Glucose-Capillary: 165 mg/dL — ABNORMAL HIGH (ref 65–99)

## 2015-01-31 LAB — PROTIME-INR
INR: 2.54 — AB (ref 0.00–1.49)
PROTHROMBIN TIME: 27 s — AB (ref 11.6–15.2)

## 2015-01-31 MED ORDER — COLCHICINE 0.6 MG PO TABS
0.6000 mg | ORAL_TABLET | Freq: Once | ORAL | Status: AC
  Administered 2015-01-31: 0.6 mg via ORAL
  Filled 2015-01-31: qty 1

## 2015-01-31 MED ORDER — CARVEDILOL 3.125 MG PO TABS
9.3750 mg | ORAL_TABLET | Freq: Two times a day (BID) | ORAL | Status: DC
  Administered 2015-01-31 – 2015-02-01 (×4): 9.375 mg via ORAL
  Filled 2015-01-31 (×10): qty 1

## 2015-01-31 MED ORDER — PREDNISONE 20 MG PO TABS
40.0000 mg | ORAL_TABLET | Freq: Every day | ORAL | Status: AC
  Administered 2015-01-31 – 2015-02-02 (×3): 40 mg via ORAL
  Filled 2015-01-31 (×4): qty 2

## 2015-01-31 MED ORDER — HYDROCORTISONE 0.5 % EX CREA
TOPICAL_CREAM | CUTANEOUS | Status: DC | PRN
  Administered 2015-01-31: 1 via TOPICAL
  Filled 2015-01-31: qty 28.35

## 2015-01-31 MED ORDER — WARFARIN SODIUM 1 MG PO TABS
1.0000 mg | ORAL_TABLET | Freq: Once | ORAL | Status: AC
  Administered 2015-01-31: 1 mg via ORAL
  Filled 2015-01-31 (×2): qty 1

## 2015-01-31 MED ORDER — FUROSEMIDE 10 MG/ML IJ SOLN
120.0000 mg | Freq: Two times a day (BID) | INTRAVENOUS | Status: DC
  Administered 2015-01-31 – 2015-02-01 (×3): 120 mg via INTRAVENOUS
  Filled 2015-01-31 (×4): qty 12

## 2015-01-31 MED ORDER — METOLAZONE 2.5 MG PO TABS
2.5000 mg | ORAL_TABLET | Freq: Once | ORAL | Status: AC
  Administered 2015-01-31: 2.5 mg via ORAL
  Filled 2015-01-31: qty 1

## 2015-01-31 NOTE — Progress Notes (Signed)
Patient ID: [name], female   DOB: [birth year], 69 y.o.   MRN: [id number]  Advanced Heart Failure Rounding Note   Subjective:    69 yo with history of chronic diastolic CHF (has been seen in CHF clinic), HTN, DM, CKD IV, OHS/OSA, sarcoidosis followed by Dr Lenna Gilford was admitted with abdominal pain/weakness/nausea/vomiting and increased lactate. She developed AKI with rise in creatinine up to 4, followed by nephrology. She was also noted to have fall in EF by echo, now 30-35% with apical hypokinesis and an apical thrombus, moderate RV dysfunction with PASP 68. GI symptoms not fully understood and reason for worsening creatinine not fully understood.   Cardiolite 6/26 with no definite ischemia or infarction, EF 47% (?accuracy of EF).   No dyspnea currently. Lying in bed without difficulty. Lasix 120 mg IV BID + metolazone 2.5 x 1.  Good UOP and creatinine continues to trend down.  Abdominal pain has resolved.  Today, she has chest pain if she takes a deep breath.  She also has pain in her left ankle/upper foot.  Says it feels like her gout.  She has been off allopurinol.   RHC Procedural Findings (6/29): Hemodynamics (mmHg) RA mean 13 RV 74/15 PA 66/26, mean 39 PCWP mean 16 Oxygen saturations: PA 64% AO 97% Cardiac Output (Fick) 4.71  Cardiac Index (Fick) 2.43 PVR 4.9 WU Cardiac Output (Thermo) 4.82 Cardiac Index (Thermo) 2.48  PVR 4.8 WU    Objective:   Weight Range: 204 lb 2.3 oz (92.6 kg) Body mass index is 36.86 kg/(m^2).   Vital Signs:   Temp:  [97.9 F (36.6 C)-98.9 F (37.2 C)] 98.9 F (37.2 C) (07/01 0528) Pulse Rate:  [68-83] 80 (07/01 0528) Resp:  [17-22] 17 (07/01 0528) BP: (121-145)/(55-68) 121/55 mmHg (07/01 0528) SpO2:  [95 %-100 %] 95 % (07/01 0724) Weight:  [204 lb 2.3 oz (92.6 kg)] 204 lb 2.3 oz (92.6 kg) (07/01 0528) Last BM Date: 01/29/15  Weight change: Filed Weights   01/29/15 0544 01/30/15 0646 01/31/15 0528  Weight: 202 lb 9.6 oz (91.899  kg) 201 lb 1 oz (91.2 kg) 204 lb 2.3 oz (92.6 kg)    Intake/Output:   Intake/Output Summary (Last 24 hours) at 01/31/15 0800 Last data filed at 01/31/15 0416  Gross per 24 hour  Intake    660 ml  Output   2650 ml  Net  -1990 ml     Physical Exam General: NAD Neck: JVP 10, no thyromegaly or thyroid nodule.  Lungs: Clear to auscultation bilaterally with normal respiratory effort. CV: Nondisplaced PMI. Heart regular S1/S2, wide split S2, no S3/S4, 1/6 SEM apex. 1+ ankle edema. No carotid bruit. Normal pedal pulses.  Abdomen: Soft, non tender today, no hepatosplenomegaly, no distention.  Neurologic: Alert and oriented x 3.  Psych: Normal affect. Extremities: No clubbing or cyanosis. Right hand swelling.   TELEMETRY: NSR  Labs: CBC  Recent Labs  01/28/15 0815  WBC 8.0  HGB 12.4  HCT 37.3  MCV 86.5  PLT 160*   Basic Metabolic Panel  Recent Labs  01/30/15 0405 01/31/15 0432  NA 132* 132*  K 3.9 4.5  CL 100* 97*  CO2 21* 22  GLUCOSE 81 89  BUN 30* 36*  CALCIUM 8.2* 8.3*   Liver Function Tests No results for input(s): AST, ALT, ALKPHOS, BILITOT, PROT, ALBUMIN in the last 72 hours. No results for input(s): LIPASE, AMYLASE in the last 72 hours. Cardiac Enzymes  Recent Labs  01/30/15 1702  TROPONINI  0.29*    BNP: BNP (last 3 results)  Recent Labs  09/02/14 0510 09/03/14 0428 01/21/15 1830  BNP 1159.9* 1107.3* 1056.8*    ProBNP (last 3 results)  Recent Labs  04/26/14 2358 06/24/14 0945  PROBNP 3725.0* 324.0*     D-Dimer No results for input(s): DDIMER in the last 72 hours. Hemoglobin A1C No results for input(s): HGBA1C in the last 72 hours. Fasting Lipid Panel No results for input(s): CHOL, HDL, LDLCALC, TRIG, CHOLHDL, LDLDIRECT in the last 72 hours. Thyroid Function Tests No results for input(s): TSH, T4TOTAL, T3FREE, THYROIDAB in the last 72 hours.  Invalid input(s): FREET3  Other results:     Imaging/Studies:  No  results found.  Latest Echo  Latest Cath   Medications:     Scheduled Medications: . antiseptic oral rinse  7 mL Mouth Rinse BID  . aspirin EC  81 mg Oral Daily  . atorvastatin  20 mg Oral QHS  . calcium carbonate  1 tablet Oral TID WC  . carvedilol  9.375 mg Oral BID WC  . clotrimazole  1 Applicatorful Vaginal QHS  . colchicine  0.6 mg Oral Once  . famotidine  20 mg Oral Daily  . fluconazole  100 mg Oral Daily  . furosemide  120 mg Intravenous BID  . insulin aspart  0-5 Units Subcutaneous QHS  . insulin aspart  0-9 Units Subcutaneous TID WC  . isosorbide-hydrALAZINE  2 tablet Oral TID  . loperamide  2 mg Oral BID  . metolazone  2.5 mg Oral Once  . mometasone-formoterol  2 puff Inhalation BID  . predniSONE  40 mg Oral Q breakfast  . saccharomyces boulardii  250 mg Oral BID  . sodium bicarbonate  650 mg Oral BID  . sodium chloride  3 mL Intravenous Q12H  . Warfarin - Pharmacist Dosing Inpatient   Does not apply q1800    Infusions: . sodium chloride 20 mL/hr at 01/24/15 1044  . sodium chloride Stopped (01/29/15 2305)    PRN Medications: sodium chloride, acetaminophen **OR** acetaminophen, albuterol, fluticasone, gi cocktail, HYDROcodone-acetaminophen, ondansetron **OR** ondansetron (ZOFRAN) IV   Assessment/Plan   69 yo with history of chronic diastolic CHF (has been seen in CHF clinic), HTN, DM, CKD IV, OHS/OSA, sarcoidosis followed by Dr Lenna Gilford was admitted with abdominal pain/weakness/nausea/vomiting and increased lactate. She developed AKI with rise in creatinine up to 4, followed by nephrology. She was also noted to have fall in EF by echo, now 30-35% with apical hypokinesis and an apical thrombus.  1. Cardiomyopathy/acute systolic CHF: New fall in EF to 30-35% with apical hypokinesis and apical thrombus. She has prior history of CAD (without significant disease in major vessels, however). Suspect ischemic cardiomyopathy with MI involving the apex in the past, but  Cardiolite this admission actually showed no definitive ischemia or prior infarction. RHC showed preserved cardiac output with moderately elevated RV filling pressure, mildly elevated LV filling pressure, and moderate pulmonary hypertension.  Still some volume overload on exam.  - Continue Bidil, increase Coreg to 9.375 mg bid.  - Continue Lasix 120 mg IV bid and will give a dose of metolazone again today.  Would like to see at least 1 more day of good diuresis.  Possible transition back to Lasix 160 mg po bid tomorrow.  2. CAD: Known from the past. She has chest pain today but very pleuritic.  Do not think this is ischemic. Still suspect prior MI as cause of EF fall with apical thrombus. However, no ischemia  on Cardiolite.  - Not candidate for coronary angiography at this point with AKI on CKD.  - Continue ASA, statin.  3. Abdominal pain/elevated lactate: Cause still unclear. No definite etiology from abdominal CT. ?mesenteric embolism from LV thrombus. Symptoms resolved. 4. AKI on CKD IV: Unsure etiology of worsening. Cardiac output preserved by RHC.  - Creatinine slowly improving. 3.55 -> 3.47 -> 3.27 -> 3.16 5. 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 Suspect there is a component of group 3 PAH from OHS/OSA. Sarcoidosis may also play a role in her PAH, but not sure how active this actually is.  - For now, treat with 02 and CPAP for OHS/OSA.  - Could consider ERA down the road if sarcoidosis is thought to play a major role in her [place].  6. LV thrombus: On warfarin, therapeutic INR.  7. Gout flare: Suspected in left foot.  Limiting her ambulation.  She is off allopurinol. I will give a dose of colchicine today and will give a 3-day prednisone burst.    Length of Stay: [place] 01/31/2015, 8:00 AM  Advanced Heart Failure Team Pager [phone] (M-F; 7a - 4p)  Please contact [facility] for night-coverage after hours (4p -7a ) and  weekends on [email or web address]

## 2015-01-31 NOTE — Progress Notes (Signed)
PT Cancellation Note  Patient Details Name: [name] MRN: [id number] DOB: 01-20-1946   Cancelled Treatment:    Reason Eval/Treat Not Completed: Pain limiting ability to participate Patient reports pain in Lt foot - "unable to put it on the floor"  Patient declined PT today.  Will return tomorrow for PT session.  Despina Pole 01/31/2015, 4:44 PM Carita Pian. Sanjuana Kava, [place] Pager [phone]

## 2015-01-31 NOTE — Progress Notes (Signed)
[facility] PROGRESS NOTE  [name] FUX:[id number] DOB: 03-13-1946 DOA: 01/20/2015 PCP: Antony Blackbird, MD  [name] is a 69 y.o. female with Past medical history of COPD, coronary artery disease, GERD, gout, obesity, chronic diastolic CHF, obstructive sleep apnea, type 2 diabetes mellitus, essential hypertension, chronic kidney disease. The patient is presenting with complaints of abdominal pain as well as nausea vomiting and diarrhea on ongoing since last 2 months progressively worsening leading to weakness and a fall.  Patient has been seen by GI: no further work up, cardiology: stress test 6/26 and nephrology.  Patient's Cr has responded to high dose lasix and has continued to diurese.     Assessment/Plan: nausea, vomiting, diarrhea and abd pain: with inability to keep things down -patient has been PPI for the last 2-3 weeks w/o significant improvement; she has no half any endoscopic procedure in the last 5 years. -gi pathogen panel negative -due to elevated lactic acid, elevated procalcitonin and low grade temp; started on sepsis order with vanc, zosyn and flagyl. patient no longer septic in appearance and no clear source of infection identified. Antibiotic hves been discontinued. Patient's symptoms could be explained by NSTEMI and severe dehydration. -GI: Since she appears resolved and no bleeding cannot make a good case for endoscopic evaluation at this time  dehydration, hypokalemia and hypomagnesemia: -repleated  Acute on chronic CHF (combined disease): EF 30-35% and NSTEMI -troponin elevated -big decrease in EF, with hypokinesis and apical thrombus -cardiology consulted and on board; coumadin for 2 months and repeat echo -daily weights -strict intake and output -Not a candidate for ACE/ARB secondary to chronic kidney disease -Started on carvedilol, imdur and hydralazine -Myoview 6/26, RHC 6/27 -still diuresing with IV Lasix- cr improving- per  cards  essential HTN: -BP overall stable -will continue current meds  acute on CKD stage 4: will monitor Cr trend -renal following Cr improving with diuresis  chronic resp failure due to COPD: will continue oxygen supplementation- wears 2 L at home -no wheezing -will continue home bronchodilators regimen  diabetes with nephropathy: continue SSI -holding oral hypoglycemic agents  metabolic acidosis - bicarb -most likely associated with renal failure and ongoing diarrhea -Renal service has been consulted and will follow their recommendations.  Yeast infection -diflucan -add topical  Code Status: Partial (no intubation) Family Communication: patient Disposition Plan:    Consultants:  GI ([facility])  Cardiology   Renal service  Procedures:  See below for x-ray reports   2-D echo Systolic function was moderately to severely reduced. The estimated ejection fraction was in the range of 30% to 35%. Wall motion was normal; there were no regional wall motion abnormalities. Doppler parameters are consistent with abnormal left ventricular relaxation (grade 1 diastolic dysfunction). Acoustic contrast opacification revealed a medium-sized, 2.2 cm (L) x 1.2 cm (W), apicalthrombusassociated with a hypokinetic segment. - Mitral valve: There was mild regurgitation. - Right ventricle: Systolic function was moderately reduced. - Pulmonary arteries: Systolic pressure was moderately increased. PA peak pressure: 62 mm Hg (S). - Pericardium, extracardiac: A trivial pericardial effusion was identified posterior to the heart.  Antibiotics:  Vanc 6/21>>6/23  Zosyn 6/21>>6/23  Flagyl stop on 6/22  HPI/Subjective: Pain where IV site is  Objective: Filed Vitals:   01/31/15 1014  BP: 133/71  Pulse: 68  Temp:   Resp:     Intake/Output Summary (Last 24 hours) at 01/31/15 1112 Last data filed at 01/31/15 0854  Gross per 24 hour  Intake    600 ml  Output    2650 ml  Net  -2050 ml   Filed Weights   01/29/15 0544 01/30/15 0646 01/31/15 0528  Weight: 91.899 kg (202 lb 9.6 oz) 91.2 kg (201 lb 1 oz) 92.6 kg (204 lb 2.3 oz)    Exam:   General:  NAD  Cardiovascular: S1 and S2, no rubs or gallops, no JVD  Respiratory: good air movement, no wheezing  Abdomen: soft, +BS  Musculoskeletal: moves all 4 xt  Data Reviewed: Basic Metabolic Panel:  Recent Labs Lab 01/25/15 0638 01/26/15 0320 01/27/15 0930 01/28/15 0815 01/29/15 0444 01/30/15 0405 01/31/15 0432  NA 133* 132* 133* 130* 132* 132* 132*  K 3.7 3.8 4.0 3.8 3.9 3.9 4.5  CL 107 105 107 105 100* 100* 97*  CO2 18* 19* 18* 17* 21* 21* 22  GLUCOSE 90 108* 86 96 82 81 89  BUN 25* 28* 29* 32* 31* 30* 36*  CREATININE 3.89* 4.02* 3.78* 3.55* 3.47* 3.27* 3.16*  CALCIUM 8.5* 8.2* 7.9* 8.0* 8.1* 8.2* 8.3*  PHOS 4.8* 5.0*  --   --   --   --   --    Liver Function Tests:  Recent Labs Lab 01/25/15 0638 01/26/15 0320  ALBUMIN 1.5* 1.5*   No results for input(s): LIPASE, AMYLASE in the last 168 hours. CBC:  Recent Labs Lab 01/25/15 0638 01/26/15 0320 01/27/15 0530 01/28/15 0815  WBC 6.8 7.2 7.2 8.0  HGB 12.3 12.1 12.2 12.4  HCT 37.6 36.5 37.2 37.3  MCV 87.0 86.5 87.3 86.5  PLT 351 379 401* 430*   Cardiac Enzymes:  Recent Labs Lab 01/30/15 1702  TROPONINI 0.29*   BNP (last 3 results)  Recent Labs  09/02/14 0510 09/03/14 0428 01/21/15 1830  BNP 1159.9* 1107.3* 1056.8*    ProBNP (last 3 results)  Recent Labs  04/26/14 2358 06/24/14 0945  PROBNP 3725.0* 324.0*    CBG:  Recent Labs Lab 01/30/15 0758 01/30/15 1215 01/30/15 1657 01/30/15 2117 01/31/15 0758  GLUCAP 80 117* 109* 157* 90    Studies: No results found.  Scheduled Meds: . antiseptic oral rinse  7 mL Mouth Rinse BID  . aspirin EC  81 mg Oral Daily  . atorvastatin  20 mg Oral QHS  . calcium carbonate  1 tablet Oral TID WC  . carvedilol  9.375 mg Oral BID WC  . clotrimazole  1  Applicatorful Vaginal QHS  . famotidine  20 mg Oral Daily  . fluconazole  100 mg Oral Daily  . furosemide  120 mg Intravenous BID  . insulin aspart  0-5 Units Subcutaneous QHS  . insulin aspart  0-9 Units Subcutaneous TID WC  . isosorbide-hydrALAZINE  2 tablet Oral TID  . loperamide  2 mg Oral BID  . mometasone-formoterol  2 puff Inhalation BID  . predniSONE  40 mg Oral Q breakfast  . saccharomyces boulardii  250 mg Oral BID  . sodium bicarbonate  650 mg Oral BID  . sodium chloride  3 mL Intravenous Q12H  . warfarin  1 mg Oral ONCE-1800  . Warfarin - Pharmacist Dosing Inpatient   Does not apply q1800   Continuous Infusions: . sodium chloride 20 mL/hr at 01/24/15 1044  . sodium chloride Stopped (01/29/15 2305)    Principal Problem:   Lactic acidosis Active Problems:   Obesity   Essential hypertension   GERD   Psoriasis   NAUSEA AND VOMITING   Chronic diastolic heart failure   Diabetes mellitus with renal manifestation   Anemia, iron  deficiency   Diarrhea   Sepsis   Protein-calorie malnutrition, severe   NSTEMI (non-ST elevated myocardial infarction)   Mural thrombus of cardiac apex   Acute renal failure superimposed on stage 4 chronic kidney disease   Hypokalemia   DCM (dilated cardiomyopathy)    Time spent: 25 minutes    Eliseo Squires Dyan Labarbera  [facility] Pager [phone]. If 7PM-7AM, please contact night-coverage at [email or web address], password [facility] 01/31/2015, 11:12 AM  LOS: 11 days

## 2015-01-31 NOTE — Progress Notes (Signed)
ANTICOAGULATION CONSULT NOTE - Follow Up Consult  Pharmacy Consult for Coumadin Indication: LV thrombus  Allergies  Allergen Reactions  . Adhesive [Tape] Other (See Comments)    Blisters   . Avelox [Moxifloxacin Hcl In Nacl] Other (See Comments)    GI upset  . Codeine Other (See Comments)    "crazy"   . Guaifenesin Nausea And Vomiting    Takes Mucinex at home without issue  . Latex Swelling  . Oxycodone Nausea And Vomiting    Takes Percocet at home without issue    Patient Measurements: Height: 5' 2.4" (158.5 cm) Weight: 204 lb 2.3 oz (92.6 kg) IBW/kg (Calculated) : 51.02  Vital Signs: Temp: 98.9 F (37.2 C) (07/01 0528) Temp Source: Oral (07/01 0528) BP: 133/71 mmHg (07/01 1014) Pulse Rate: 68 (07/01 1014)  Labs:  Recent Labs  01/29/15 0444 01/30/15 0405 01/30/15 1702 01/31/15 0432  LABPROT 28.6* 29.1*  --  27.0*  INR 2.74* 2.80*  --  2.54*  CREATININE 3.47* 3.27*  --  3.16*  TROPONINI  --   --  0.29*  --     Estimated Creatinine Clearance: 18.7 mL/min (by C-G formula based on Cr of 3.16).  Assessment: 68yof continues on coumadin for LV thrombus. INR is therapeutic at 2.54. She appears to be very sensitive to small dose changes in coumadin. PO fluconazole added 6/29 which can increase INR so will continue to watch for interaction. No bleeding reported.  Goal of Therapy:  INR 2-3 Monitor platelets by anticoagulation protocol: Yes   Plan:  1) Coumadin 1mg  x 1 2) Daily INR  Deboraha Sprang 01/31/2015,10:59 AM

## 2015-01-31 NOTE — Care Management (Signed)
Important Message  Patient Details  Name: [name] MRN: [id number] Date of Birth: Apr 03, 1946   Medicare Important Message Given:  Yes-second notification given    Carles Collet, RN 01/31/2015, 1:59 PM

## 2015-02-01 DIAGNOSIS — I509 Heart failure, unspecified: Secondary | ICD-10-CM | POA: Insufficient documentation

## 2015-02-01 DIAGNOSIS — M1000 Idiopathic gout, unspecified site: Secondary | ICD-10-CM

## 2015-02-01 DIAGNOSIS — D509 Iron deficiency anemia, unspecified: Secondary | ICD-10-CM

## 2015-02-01 DIAGNOSIS — I5041 Acute combined systolic (congestive) and diastolic (congestive) heart failure: Secondary | ICD-10-CM

## 2015-02-01 DIAGNOSIS — E1129 Type 2 diabetes mellitus with other diabetic kidney complication: Secondary | ICD-10-CM

## 2015-02-01 LAB — CBC
HEMATOCRIT: 47.3 % — AB (ref 36.0–46.0)
Hemoglobin: 15.4 g/dL — ABNORMAL HIGH (ref 12.0–15.0)
MCH: 28.1 pg (ref 26.0–34.0)
MCHC: 32.6 g/dL (ref 30.0–36.0)
MCV: 86.3 fL (ref 78.0–100.0)
Platelets: 357 10*3/uL (ref 150–400)
RBC: 5.48 MIL/uL — ABNORMAL HIGH (ref 3.87–5.11)
RDW: 16.2 % — ABNORMAL HIGH (ref 11.5–15.5)
WBC: 6.3 10*3/uL (ref 4.0–10.5)

## 2015-02-01 LAB — BASIC METABOLIC PANEL
Anion gap: 11 (ref 5–15)
BUN: 35 mg/dL — ABNORMAL HIGH (ref 6–20)
CO2: 27 mmol/L (ref 22–32)
Calcium: 8.1 mg/dL — ABNORMAL LOW (ref 8.9–10.3)
Chloride: 94 mmol/L — ABNORMAL LOW (ref 101–111)
Creatinine, Ser: 3.21 mg/dL — ABNORMAL HIGH (ref 0.44–1.00)
GFR calc Af Amer: 16 mL/min — ABNORMAL LOW (ref >60)
GFR, EST NON AFRICAN AMERICAN: 14 mL/min — AB (ref >60)
Glucose, Bld: 122 mg/dL — ABNORMAL HIGH (ref 65–99)
Potassium: 4.1 mmol/L (ref 3.5–5.1)
SODIUM: 132 mmol/L — AB (ref 135–145)

## 2015-02-01 LAB — GLUCOSE, CAPILLARY
GLUCOSE-CAPILLARY: 242 mg/dL — AB (ref 65–99)
Glucose-Capillary: 127 mg/dL — ABNORMAL HIGH (ref 65–99)
Glucose-Capillary: 300 mg/dL — ABNORMAL HIGH (ref 65–99)
Glucose-Capillary: 219 mg/dL — ABNORMAL HIGH (ref 65–99)

## 2015-02-01 LAB — PROTIME-INR
INR: 2.5 — AB (ref 0.00–1.49)
Prothrombin Time: 26.7 seconds — ABNORMAL HIGH (ref 11.6–15.2)

## 2015-02-01 MED ORDER — WARFARIN SODIUM 1 MG PO TABS
1.0000 mg | ORAL_TABLET | Freq: Once | ORAL | Status: AC
  Administered 2015-02-01: 1 mg via ORAL
  Filled 2015-02-01: qty 1

## 2015-02-01 MED ORDER — DIPHENHYDRAMINE HCL 25 MG PO CAPS
25.0000 mg | ORAL_CAPSULE | Freq: Three times a day (TID) | ORAL | Status: DC | PRN
  Administered 2015-02-01 (×2): 25 mg via ORAL
  Filled 2015-02-01 (×2): qty 1

## 2015-02-01 MED ORDER — FUROSEMIDE 80 MG PO TABS
160.0000 mg | ORAL_TABLET | Freq: Two times a day (BID) | ORAL | Status: DC
  Administered 2015-02-01 – 2015-02-02 (×2): 160 mg via ORAL
  Filled 2015-02-01 (×4): qty 2

## 2015-02-01 MED ORDER — CAMPHOR-MENTHOL 0.5-0.5 % EX LOTN
TOPICAL_LOTION | CUTANEOUS | Status: DC | PRN
  Administered 2015-02-01: 1 via TOPICAL
  Filled 2015-02-01: qty 222

## 2015-02-01 NOTE — Progress Notes (Signed)
Utilization Review completed. Heily Carlucci RN BSN CM 

## 2015-02-01 NOTE — Progress Notes (Signed)
Physical Therapy Treatment Patient Details Name: [name] MRN: [id number] DOB: 1946/07/31 Today's Date: 02/01/2015    History of Present Illness Pt is a 69 y/o female presenting with abdominal pain, N/V/D, and fall. Troponin elevated and pt was found to have NSTEMI - 2D echo revealed LV thrombus.     PT Comments    Pt reports feeling better today.  Requests not to sit in chair as she has difficulty finding comfortable position w/o buttocks pain.  Pt ambulated 150 ft w/ RW this session w/ min guard assist, requiring standing rest break x3 2/2 generalized fatigue.  Pt's SpO2 remained above 94% on RA throughout duration of session.  Pt will benefit from continued skilled PT services to increase functional independence and safety.   Follow Up Recommendations  Home health PT;Supervision/Assistance - 24 hour     Equipment Recommendations  None recommended by PT    Recommendations for Other Services       Precautions / Restrictions Precautions Precautions: Fall Restrictions Weight Bearing Restrictions: No    Mobility  Bed Mobility Overal bed mobility: Modified Independent Bed Mobility: Supine to Sit     Supine to sit: Modified independent (Device/Increase time)     General bed mobility comments: pt safe with supine to sit  Transfers Overall transfer level: Needs assistance Equipment used: Rolling walker (2 wheeled) Transfers: Sit to/from Stand Sit to Stand: Min assist         General transfer comment: Min assist from toilet to stand to power to standing w/ use of side rails.  Pt reports fatigue after ambulating in hallway.  Ambulation/Gait Ambulation/Gait assistance: Min guard Ambulation Distance (Feet): 150 Feet Assistive device: Rolling walker (2 wheeled) Gait Pattern/deviations: Step-to pattern;Step-through pattern;Antalgic;Trunk flexed;Decreased stride length   Gait velocity interpretation: Below normal speed for age/gender General Gait Details: Pt  required standing rest break x3 2/2 generalized fatigue.  VCs to stand upright and look ahead while walking in hallway.  Pt on RA and sats ranging from 94-99%.   Stairs            Wheelchair Mobility    Modified Rankin (Stroke Patients Only)       Balance Overall balance assessment: Needs assistance Sitting-balance support: No upper extremity supported;Feet supported Sitting balance-Leahy Scale: Good     Standing balance support: Bilateral upper extremity supported;No upper extremity supported Standing balance-Leahy Scale: Poor (relies on RW to maintain balance)                      Cognition Arousal/Alertness: Awake/alert Behavior During Therapy: WFL for tasks assessed/performed Overall Cognitive Status: Within Functional Limits for tasks assessed                      Exercises      General Comments General comments (skin integrity, edema, etc.): Pt placed back on 2LPM O2 w/ SpO2 at 99% at end of session      Pertinent Vitals/Pain Pain Assessment: No/denies pain    Home Living                      Prior Function            PT Goals (current goals can now be found in the care plan section) Acute Rehab PT Goals Patient Stated Goal: Return home PT Goal Formulation: With patient Time For Goal Achievement: 01/31/15 Potential to Achieve Goals: Good Progress towards PT goals: Progressing toward goals  Frequency  Min 3X/week    PT Plan Current plan remains appropriate    Co-evaluation             End of Session Equipment Utilized During Treatment: Gait belt;Oxygen Activity Tolerance: Patient limited by fatigue Patient left: in bed;with call bell/phone within reach     Time: 7262-0355 PT Time Calculation (min) (ACUTE ONLY): 18 min  Charges:  $Gait Training: 8-22 mins                    G Codes:      Joslyn Hy PT, [place] [phone] Pager: [phone] 02/01/2015, 4:20 PM

## 2015-02-01 NOTE — Progress Notes (Signed)
[facility]                                                                              Patient Demographics  [name], is a 69 y.o. female, DOB - 12/23/1945, DZH:[id number]  Admit date - 01/20/2015   Admitting Physician Lavina Hamman, MD  Outpatient Primary MD for the patient is FULP, CAMMIE, MD  LOS - 12   Chief Complaint  Patient presents with  . Emesis  . Diarrhea       Brief HPI  [name] is a 69 y.o. female with Past medical history of COPD, coronary artery disease, GERD, gout, obesity, chronic diastolic CHF, obstructive sleep apnea, type 2 diabetes mellitus, essential hypertension, chronic kidney disease. The patient is presenting with complaints of abdominal pain as well as nausea vomiting and diarrhea on ongoing since last 2 months progressively worsening leading to weakness and a fall. Patient has been seen by GI: no further work up, cardiology: stress test 6/26 and nephrology. Patient's Cr has responded to high dose lasix and has continued to diurese.     Assessment & Plan    Principal Problem: Nausea, vomiting, diarrhea and abd pain: with inability to keep things down, ? ischemic colitis -patient has been PPI for the last 2-3 weeks with no significant improvement; No endoscopic procedure in the last 5 years. - gi pathogen panel negative -due to elevated lactic acid, elevated procalcitonin and low grade temp; patient was started on Vanco, Zosyn and Flagyl. However no clear source of infection was found hence antibiotics have been discontinued. Patient's symptoms could be explained by NSTEMI and severe dehydration. - Gastroenterology, Dr. Carlean Purl was consulted and since the patient was improving, no bleeding, no endoscopy was recommended.  Active problems Acute on chronic CHF (combined disease): EF 30-35% and NSTEMI -troponin elevated -2-D echo was obtained which showed EF of 30-35% with apical hypokinesis and apical thrombus.  Patient has a history of coronary artery disease.  Cardiology was consulted, patient underwent stress test on 6/26 which showed intermediate risk study with EF of 47%.  - Cardiac cath showed preserved cardiac output with moderately elevated RV filling pressure, mildly elevated LV filling pressure, moderate pulmonary hypertension - Cardiology recommended BiDil and Coreg, switch Lasix 260 mg oral twice a day, check BNP in a.m. - Cardiology recommended coumadin for 2 months and repeat echo - Strict I's and O's and daily weights  Essential HTN: -BP overall stable -will continue current meds  LV thrombus - Patient started on warfarin, cardiology recommended Coumadin for 2 months and repeat echo  Acute on CKD stage 4: will monitor Cr trend -renal following, creatinine function appears to have plateaued at 3.1-3.2 - Follow creatinine function with diuresis  chronic resp failure due to COPD:  - continue oxygen supplementation- wears 2 L at home, currently no wheezing -will continue home bronchodilators regimen  Diabetes mellitus with nephropathy: continue SSI  Acute gout in the left foot - Continue prednisone, states the pain is improving significantly  Yeast infection -diflucan  Code Status: Full code  Family Communication: Discussed in detail with the patient, all imaging results, lab results  explained to the patient    Disposition Plan: Hopefully DC in 24-48 hours, been cleared by cardiology  Time Spent in minutes  25 minutes  Procedures  2-D echo Stress test Cardiac cath  Consults   Cardiology Gastroenterology Renal service  DVT Prophylaxis  Coumadin  Medications  Scheduled Meds: . antiseptic oral rinse  7 mL Mouth Rinse BID  . aspirin EC  81 mg Oral Daily  . atorvastatin  20 mg Oral QHS  . calcium carbonate  1 tablet Oral TID WC  . carvedilol  9.375 mg Oral BID WC  . clotrimazole  1 Applicatorful Vaginal QHS  . famotidine  20 mg Oral Daily  . fluconazole  100  mg Oral Daily  . furosemide  160 mg Oral BID  . insulin aspart  0-5 Units Subcutaneous QHS  . insulin aspart  0-9 Units Subcutaneous TID WC  . isosorbide-hydrALAZINE  2 tablet Oral TID  . loperamide  2 mg Oral BID  . mometasone-formoterol  2 puff Inhalation BID  . predniSONE  40 mg Oral Q breakfast  . saccharomyces boulardii  250 mg Oral BID  . sodium bicarbonate  650 mg Oral BID  . sodium chloride  3 mL Intravenous Q12H  . warfarin  1 mg Oral ONCE-1800  . Warfarin - Pharmacist Dosing Inpatient   Does not apply q1800   Continuous Infusions: . sodium chloride 20 mL/hr at 01/24/15 1044  . sodium chloride Stopped (01/29/15 2305)   PRN Meds:.sodium chloride, acetaminophen **OR** acetaminophen, albuterol, camphor-menthol, diphenhydrAMINE, fluticasone, gi cocktail, HYDROcodone-acetaminophen, hydrocortisone cream, ondansetron **OR** ondansetron (ZOFRAN) IV   Antibiotics   Anti-infectives    Start     Dose/Rate Route Frequency Ordered Stop   01/29/15 1430  fluconazole (DIFLUCAN) tablet 100 mg     100 mg Oral Daily 01/29/15 1412     01/24/15 2330  vancomycin (VANCOCIN) IVPB 1000 mg/200 mL premix  Status:  Discontinued     1,000 mg 200 mL/hr over 60 Minutes Intravenous Every 48 hours 01/23/15 0839 01/23/15 1010   01/23/15 1400  piperacillin-tazobactam (ZOSYN) IVPB 2.25 g  Status:  Discontinued     2.25 g 100 mL/hr over 30 Minutes Intravenous 3 times per day 01/23/15 0838 01/23/15 1010   01/21/15 0000  vancomycin (VANCOCIN) IVPB 1000 mg/200 mL premix  Status:  Discontinued     1,000 mg 200 mL/hr over 60 Minutes Intravenous Every 24 hours 01/20/15 2249 01/23/15 0839   01/20/15 2300  metroNIDAZOLE (FLAGYL) IVPB 500 mg  Status:  Discontinued     500 mg 100 mL/hr over 60 Minutes Intravenous Every 8 hours 01/20/15 2235 01/22/15 1904   01/20/15 2300  piperacillin-tazobactam (ZOSYN) IVPB 3.375 g  Status:  Discontinued     3.375 g 12.5 mL/hr over 240 Minutes Intravenous 3 times per day 01/20/15  2249 01/23/15 0838        Subjective:   [name] was seen and examined today. Feeling overall better, no acute shortness of breath. No chest pain. No nausea, vomiting. Left foot pain is improving. Patient denies dizziness, chest pain, shortness of breath, abdominal pain, N/V/D/C, new weakness, numbess, tingling. No acute events overnight.    Objective:   Blood pressure 146/62, pulse 61, temperature 97.6 F (36.4 C), temperature source Oral, resp. rate 18, height 5' 2.4" (1.585 m), weight 90.1 kg (198 lb 10.2 oz), SpO2 97 %.  Wt Readings from Last 3 Encounters:  02/01/15 90.1 kg (198 lb 10.2 oz)  01/02/15 90.266 kg (199 lb)  01/01/15 94.348 kg (208 lb)     Intake/Output Summary (Last 24 hours) at 02/01/15 1359 Last data filed at 02/01/15 0916  Gross per 24 hour  Intake    200 ml  Output   1900 ml  Net  -1700 ml    Exam  General: Alert and oriented x 3, NAD  HEENT:  PERRLA, EOMI, Anicteric Sclera, mucous membranes moist.   Neck: Supple, no JVD, no masses  CVS: S1 S2 auscultated, Regular rate and rhythm.  Respiratory: Clear to auscultation bilaterally, no wheezing, rales or rhonchi  Abdomen: Soft, nontender, nondistended, + bowel sounds  Ext: no cyanosis clubbing or edema. 1+ ankle edema on the left foot  Neuro: AAOx3, Cr N's II- XII. Strength 5/5 upper and lower extremities bilaterally  Skin: No rashes  Psych: Normal affect and demeanor, alert and oriented x3    Data Review   Micro Results Recent Results (from the past 240 hour(s))  Clostridium Difficile by PCR (not at [facility])     Status: None   Collection Time: 01/24/15 10:25 AM  Result Value Ref Range Status   C difficile by pcr NEGATIVE NEGATIVE Final    Radiology Reports Ct Abdomen Pelvis Wo Contrast  01/20/2015   CLINICAL DATA:  69 year old female with 3-4 week history of nausea, vomiting, diarrhea, weakness and abdominal pain while eating. History of trauma from a fall today.  EXAM: CT  ABDOMEN AND PELVIS WITHOUT CONTRAST  TECHNIQUE: Multidetector CT imaging of the abdomen and pelvis was performed following the standard protocol without IV contrast.  COMPARISON:  CT of the abdomen and pelvis 12/21/2014.  FINDINGS: Lower chest: Mild cylindrical bronchiectasis, thickening of the peribronchovascular interstitium and architectural distortion in the lower lobes of the lungs bilaterally, which could be post infectious, or could be related to recurrent aspiration. Mild cardiomegaly. Atherosclerotic calcifications in the left main and left anterior descending coronary artery.  Hepatobiliary: Status post cholecystectomy. Irregular area of low attenuation and calcification in the posterior aspect of the right lobe of the liver is similar to remote prior studies dating back to at least 08/25/2013, favored to be related to sequela of remote trauma. No new hepatic lesions.  Pancreas: No definite pancreatic mass or surrounding inflammatory changes on today's noncontrast CT examination.  Spleen: Status post splenectomy. Multiple small splenules noted in the left side of the abdomen.  Adrenals/Urinary Tract: The unenhanced appearance of the adrenal glands bilaterally and the right kidney is normal. Mild scarring along the lateral aspect of the upper pole of the left kidney, likely related to prior splenectomy. Left kidney is otherwise normal in appearance. No hydroureteronephrosis. Urinary bladder is normal in appearance.  Stomach/Bowel: The unenhanced appearance of the stomach is normal. No pathologic dilatation of small bowel or colon. A few scattered colonic diverticulae are noted, without surrounding inflammatory changes to suggest an acute diverticulitis at this time.  Vascular/Lymphatic: Moderate atherosclerosis throughout the abdominal and pelvic vasculature, without definite aneurysm. No lymphadenopathy noted in the abdomen or pelvis on today's noncontrast CT examination.  Reproductive: Status post  hysterectomy.  Ovaries are atrophic.  Other: No significant volume of ascites.  No pneumoperitoneum.  Musculoskeletal: There are no aggressive appearing lytic or blastic lesions noted in the visualized portions of the skeleton.  IMPRESSION: 1. No acute findings in the abdomen or pelvis on today's noncontrast CT examination to account for the patient's symptoms. 2. The appearance of the lower lobes of the lungs could suggest post infectious scarring, but could also be seen in  the setting of recurrent aspiration. Clinical correlation is suggested. 3. Mild cardiomegaly. 4. Atherosclerosis, including left anterior descending coronary artery disease. Please note that although the presence of coronary artery calcium documents the presence of coronary artery disease, the severity of this disease and any potential stenosis cannot be assessed on this non-gated CT examination. Assessment for potential risk factor modification, dietary therapy or pharmacologic therapy may be warranted, if clinically indicated. 5. Status post splenectomy. Multiple splenules are noted throughout the left side of the abdomen. 6. Status post cholecystectomy. 7. Additional incidental findings, as above.   Electronically Signed   By: Vinnie Langton M.D.   On: 01/20/2015 19:38   Dg Chest 2 View  01/28/2015   CLINICAL DATA:  CHF, history hypertension, diabetes mellitus, COPD, sarcoidosis, chronic kidney disease, coronary artery disease  EXAM: CHEST  2 VIEW  COMPARISON:  12/31/2014  FINDINGS: Enlargement of cardiac silhouette with pulmonary vascular congestion.  Minimal interstitial changes in the perihilar regions likely reflect chronic failure.  No acute pulmonary edema, pleural effusion or pneumothorax.  Minimal atelectasis LEFT base.  Bones demineralized.  IMPRESSION: Enlargement of cardiac silhouette with pulmonary vascular congestion and minimal chronic accentuation of perihilar markings likely related to chronic failure.  Mild LEFT basilar  atelectasis.   Electronically Signed   By: Lavonia Dana M.D.   On: 01/28/2015 17:38   Nm Gastric Emptying  01/02/2015   CLINICAL DATA:  Severe abdominal pain with nausea and vomiting. Abdominal bloating.  EXAM: NUCLEAR MEDICINE GASTRIC EMPTYING SCAN  TECHNIQUE: After oral ingestion of radiolabeled meal, sequential abdominal images were obtained for 4 hours. Percentage of activity emptying the stomach was calculated at 1 hour, 2 hour, 3 hour, and 4 hours.  RADIOPHARMACEUTICALS:  2.0 mCi Technetium-32m labeled sulfur colloid orally cooked with one egg with 8 oz of ginger ale.  COMPARISON:  None.  FINDINGS: Expected location of the stomach in the left upper quadrant. Ingested meal empties the stomach gradually over the course of the study.  40.3% emptied at 1 hr ( normal >= 10%)  76.85% emptied at 2 hr ( normal >= 40%)  93.18% emptied at 3 hr ( normal >= 70%)  98.25% emptied at 4 hr ( normal >= 90%)  IMPRESSION: Normal gastric emptying study.   Electronically Signed   By: Lorriane Shire M.D.   On: 01/02/2015 15:04   US Renal  01/24/2015   CLINICAL DATA:  Acute renal failure, history essential benign hypertension, coronary artery disease, COPD, type II diabetes mellitus, sarcoidosis, stage IV chronic kidney disease  EXAM: RENAL / URINARY TRACT ULTRASOUND COMPLETE  COMPARISON:  CT abdomen and pelvis 01/20/2015  FINDINGS: Degradation of image quality secondary to body habitus.  Right Kidney:  Length: 9.2 cm. Grossly normal cortical thickness and echogenicity. No definite mass, hydronephrosis or shadowing calcification.  Left Kidney:  Length: 10.0 cm. Grossly normal cortical thickness and echogenicity. No definite mass, hydronephrosis or shadowing calcification.  Bladder:  Partially distended, grossly unremarkable.  IMPRESSION: No acute abnormalities.   Electronically Signed   By: Lavonia Dana M.D.   On: 01/24/2015 16:54   Nm Myocar Multi W/spect W/wall Motion / Ef  01/26/2015    There was no ST segment deviation  noted during stress.  No T wave inversion was noted during stress.  Defect 1: There is a defect of mild severity present in the mid  inferolateral location.  The study is normal.  This is an intermediate risk study.  The left ventricular ejection fraction is  mildly decreased (45-54%).  Nuclear stress EF: 47%.     CBC  Recent Labs Lab 01/26/15 0320 01/27/15 0530 01/28/15 0815 02/01/15 0720  WBC 7.2 7.2 8.0 6.3  HGB 12.1 12.2 12.4 15.4*  HCT 36.5 37.2 37.3 47.3*  PLT 379 401* 430* 357  MCV 86.5 87.3 86.5 86.3  MCH 28.7 28.6 28.8 28.1  MCHC 33.2 32.8 33.2 32.6  RDW 16.5* 16.5* 16.5* 16.2*    Chemistries   Recent Labs Lab 01/28/15 0815 01/29/15 0444 01/30/15 0405 01/31/15 0432 02/01/15 0546  NA 130* 132* 132* 132* 132*  K 3.8 3.9 3.9 4.5 4.1  CL 105 100* 100* 97* 94*  CO2 17* 21* 21* 22 27  GLUCOSE 96 82 81 89 122*  BUN 32* 31* 30* 36* 35*  CREATININE 3.55* 3.47* 3.27* 3.16* 3.21*  CALCIUM 8.0* 8.1* 8.2* 8.3* 8.1*   ------------------------------------------------------------------------------------------------------------------ estimated creatinine clearance is 18.1 mL/min (by C-G formula based on Cr of 3.21). ------------------------------------------------------------------------------------------------------------------ No results for input(s): HGBA1C in the last 72 hours. ------------------------------------------------------------------------------------------------------------------ No results for input(s): CHOL, HDL, LDLCALC, TRIG, CHOLHDL, LDLDIRECT in the last 72 hours. ------------------------------------------------------------------------------------------------------------------ No results for input(s): TSH, T4TOTAL, T3FREE, THYROIDAB in the last 72 hours.  Invalid input(s): FREET3 ------------------------------------------------------------------------------------------------------------------ No results for input(s): VITAMINB12, FOLATE, FERRITIN,  TIBC, IRON, RETICCTPCT in the last 72 hours.  Coagulation profile  Recent Labs Lab 01/28/15 0815 01/29/15 0444 01/30/15 0405 01/31/15 0432 02/01/15 0546  INR 2.63* 2.74* 2.80* 2.54* 2.50*    No results for input(s): DDIMER in the last 72 hours.  Cardiac Enzymes  Recent Labs Lab 01/30/15 1702  TROPONINI 0.29*   ------------------------------------------------------------------------------------------------------------------ Invalid input(s): POCBNP   Recent Labs  01/31/15 0758 01/31/15 1153 01/31/15 1716 01/31/15 2140 02/01/15 0728 02/01/15 1218  GLUCAP 90 115* 174* 165* 127* 300*     Estefani Bateson M.D. [facility] 02/01/2015, 1:59 PM  Pager: [phone]   Between 7am to 7pm - call Pager - [phone]  After 7pm go to [email or web address] - password [email or web address]  Call night coverage person covering after 7pm

## 2015-02-01 NOTE — Progress Notes (Signed)
Patient ID: [name], female   DOB: 1946/04/28, 69 y.o.   MRN: [id number]   Subjective:    69 yo with history of chronic diastolic CHF (has been seen in CHF clinic), HTN, DM, CKD IV, OHS/OSA, sarcoidosis followed by Dr Lenna Gilford was admitted with abdominal pain/weakness/nausea/vomiting and increased lactate. She developed AKI with rise in creatinine up to 4, followed by nephrology. She was also noted to have fall in EF by echo, now 30-35% with apical hypokinesis and an apical thrombus, moderate RV dysfunction with PASP 68. GI symptoms not fully understood and reason for worsening creatinine not fully understood.   Cardiolite 6/26 with no definite ischemia or infarction, EF 47% (?accuracy of EF).   No dyspnea currently. High dose diuretics for 2 days, about -3L negative. Weight is down to 198 lbs from 204 lbs. Creatinine has plateaud. INR remains therapeutic.  RHC Procedural Findings (6/29): Hemodynamics (mmHg) RA mean 13 RV 74/15 PA 66/26, mean 39 PCWP mean 16 Oxygen saturations: PA 64% AO 97% Cardiac Output (Fick) 4.71  Cardiac Index (Fick) 2.43 PVR 4.9 WU Cardiac Output (Thermo) 4.82 Cardiac Index (Thermo) 2.48  PVR 4.8 WU    Objective:   Weight Range: 198 lb 10.2 oz (90.1 kg) Body mass index is 35.86 kg/(m^2).   Vital Signs:   Temp:  [97.6 F (36.4 C)-98.2 F (36.8 C)] 97.6 F (36.4 C) (07/02 0553) Pulse Rate:  [61-84] 61 (07/02 0811) Resp:  [18] 18 (07/02 0553) BP: (121-152)/(56-73) 146/62 mmHg (07/02 0811) SpO2:  [96 %-100 %] 96 % (07/02 0553) FiO2 (%):  [1.5 %-2 %] 1.5 % (07/01 2152) Weight:  [198 lb 10.2 oz (90.1 kg)] 198 lb 10.2 oz (90.1 kg) (07/02 0553) Last BM Date: 01/30/15  Weight change: Filed Weights   01/30/15 0646 01/31/15 0528 02/01/15 0553  Weight: 201 lb 1 oz (91.2 kg) 204 lb 2.3 oz (92.6 kg) 198 lb 10.2 oz (90.1 kg)    Intake/Output:   Intake/Output Summary (Last 24 hours) at 02/01/15 0911 Last data filed at 02/01/15 0811  Gross  per 24 hour  Intake    222 ml  Output   2650 ml  Net  -2428 ml     Physical Exam General: NAD Neck: JVP 7, no thyromegaly or thyroid nodule.  Lungs: Mild right basilar crackles CV: Nondisplaced PMI. Heart regular S1/S2, wide split S2, no S3/S4, 1/6 SEM apex. 1+ ankle edema. No carotid bruit. Normal pedal pulses.  Abdomen: Soft, non tender today, no hepatosplenomegaly, no distention.  Neurologic: Alert and oriented x 3.  Psych: Normal affect. Extremities: No clubbing or cyanosis. Right hand swelling.   TELEMETRY: NSR  Labs: CBC  Recent Labs  02/01/15 0720  WBC 6.3  HGB 15.4*  HCT 47.3*  MCV 86.3  PLT 283   Basic Metabolic Panel  Recent Labs  01/31/15 0432 02/01/15 0546  NA 132* 132*  K 4.5 4.1  CL 97* 94*  CO2 22 27  GLUCOSE 89 122*  BUN 36* 35*  CALCIUM 8.3* 8.1*   Liver Function Tests No results for input(s): AST, ALT, ALKPHOS, BILITOT, PROT, ALBUMIN in the last 72 hours. No results for input(s): LIPASE, AMYLASE in the last 72 hours. Cardiac Enzymes  Recent Labs  01/30/15 1702  TROPONINI 0.29*    BNP: BNP (last 3 results)  Recent Labs  09/02/14 0510 09/03/14 0428 01/21/15 1830  BNP 1159.9* 1107.3* 1056.8*    ProBNP (last 3 results)  Recent Labs  04/26/14 2358 06/24/14 0945  PROBNP 3725.0* 324.0*     D-Dimer No results for input(s): DDIMER in the last 72 hours. Hemoglobin A1C No results for input(s): HGBA1C in the last 72 hours. Fasting Lipid Panel No results for input(s): CHOL, HDL, LDLCALC, TRIG, CHOLHDL, LDLDIRECT in the last 72 hours. Thyroid Function Tests No results for input(s): TSH, T4TOTAL, T3FREE, THYROIDAB in the last 72 hours.  Invalid input(s): FREET3  Other results:     Imaging/Studies:  No results found.  Latest Echo  Latest Cath   Medications:     Scheduled Medications: . antiseptic oral rinse  7 mL Mouth Rinse BID  . aspirin EC  81 mg Oral Daily  . atorvastatin  20 mg Oral QHS  .  calcium carbonate  1 tablet Oral TID WC  . carvedilol  9.375 mg Oral BID WC  . clotrimazole  1 Applicatorful Vaginal QHS  . famotidine  20 mg Oral Daily  . fluconazole  100 mg Oral Daily  . furosemide  120 mg Intravenous BID  . insulin aspart  0-5 Units Subcutaneous QHS  . insulin aspart  0-9 Units Subcutaneous TID WC  . isosorbide-hydrALAZINE  2 tablet Oral TID  . loperamide  2 mg Oral BID  . mometasone-formoterol  2 puff Inhalation BID  . predniSONE  40 mg Oral Q breakfast  . saccharomyces boulardii  250 mg Oral BID  . sodium bicarbonate  650 mg Oral BID  . sodium chloride  3 mL Intravenous Q12H  . Warfarin - Pharmacist Dosing Inpatient   Does not apply q1800    Infusions: . sodium chloride 20 mL/hr at 01/24/15 1044  . sodium chloride Stopped (01/29/15 2305)    PRN Medications: sodium chloride, acetaminophen **OR** acetaminophen, albuterol, camphor-menthol, diphenhydrAMINE, fluticasone, gi cocktail, HYDROcodone-acetaminophen, hydrocortisone cream, ondansetron **OR** ondansetron (ZOFRAN) IV   Assessment/Plan   69 yo with history of chronic diastolic CHF (has been seen in CHF clinic), HTN, DM, CKD IV, OHS/OSA, sarcoidosis followed by Dr Lenna Gilford was admitted with abdominal pain/weakness/nausea/vomiting and increased lactate. She developed AKI with rise in creatinine up to 4, followed by nephrology. She was also noted to have fall in EF by echo, now 30-35% with apical hypokinesis and an apical thrombus.  1. Cardiomyopathy/acute systolic CHF: New fall in EF to 30-35% with apical hypokinesis and apical thrombus. She has prior history of CAD (without significant disease in major vessels, however). Suspect ischemic cardiomyopathy with MI involving the apex in the past, but Cardiolite this admission actually showed no definitive ischemia or prior infarction. RHC showed preserved cardiac output with moderately elevated RV filling pressure, mildly elevated LV filling pressure, and moderate  pulmonary hypertension.  Still some volume overload on exam.  - Continue Bidil and Coreg 9.375 mg bid.  - Switch lasix to 160 mg po bid today  - Check BNP in am tomorrow 2. CAD: Known from the past. She has chest pain today but very pleuritic.  Do not think this is ischemic. Still suspect prior MI as cause of EF fall with apical thrombus. However, no ischemia on Cardiolite.  - Not candidate for coronary angiography at this point with AKI on CKD.  - Continue ASA, statin.  3. Abdominal pain/elevated lactate: Cause still unclear. No definite etiology from abdominal CT. ?mesenteric embolism from LV thrombus. Symptoms resolved. 4. AKI on CKD IV: Unsure etiology of worsening. Cardiac output preserved by RHC.  - Creatinine has plateaud. 3.55 -> 3.47 -> 3.27 -> 3.16 -> 3.21 5. 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  Suspect there is a component of group 3 PAH from OHS/OSA. Sarcoidosis may also play a role in her PAH, but not sure how active this actually is.  - For now, treat with 02 and CPAP for OHS/OSA.  - Could consider ERA down the road if sarcoidosis is thought to play a major role in her Miller.  6. LV thrombus: On warfarin, therapeutic INR.  7. Gout flare: Suspected in left foot.  Limiting her ambulation.  She is off allopurinol. S/p colchicine and now getting a 3-day prednisone burst.  Foot pain is better, upper arm swelling persists.  Length of Stay: 12   Pixie Casino, MD, [facility] Attending Cardiologist [place] 02/01/2015, 9:11 AM

## 2015-02-01 NOTE — Progress Notes (Signed)
ANTICOAGULATION CONSULT NOTE - Follow Up Consult  Pharmacy Consult for Coumadin Indication: LV thrombus  Allergies  Allergen Reactions  . Adhesive [Tape] Other (See Comments)    Blisters   . Avelox [Moxifloxacin Hcl In Nacl] Other (See Comments)    GI upset  . Codeine Other (See Comments)    "crazy"   . Guaifenesin Nausea And Vomiting    Takes Mucinex at home without issue  . Latex Swelling  . Oxycodone Nausea And Vomiting    Takes Percocet at home without issue    Patient Measurements: Height: 5' 2.4" (158.5 cm) Weight: 198 lb 10.2 oz (90.1 kg) IBW/kg (Calculated) : 51.02  Vital Signs: Temp: 97.6 F (36.4 C) (07/02 0553) Temp Source: Oral (07/02 0553) BP: 146/62 mmHg (07/02 0811) Pulse Rate: 61 (07/02 0811)  Labs:  Recent Labs  01/30/15 0405 01/30/15 1702 01/31/15 0432 02/01/15 0546 02/01/15 0720  HGB  --   --   --   --  15.4*  HCT  --   --   --   --  47.3*  PLT  --   --   --   --  357  LABPROT 29.1*  --  27.0* 26.7*  --   INR 2.80*  --  2.54* 2.50*  --   CREATININE 3.27*  --  3.16* 3.21*  --   TROPONINI  --  0.29*  --   --   --     Estimated Creatinine Clearance: 18.1 mL/min (by C-G formula based on Cr of 3.21).  Assessment: 85 yof continues on coumadin for LV thrombus. INR is therapeutic at 2.5. She appears to be very sensitive to small dose changes in coumadin. PO fluconazole added 6/29 which can increase INR so will continue to watch for interaction. No bleeding reported.  Goal of Therapy:  INR 2-3 Monitor platelets by anticoagulation protocol: Yes   Plan:  1) Coumadin 1mg  x 1 2) Daily INR  [name], PharmD, BCPS  Clinical Pharmacist  Pager: [phone]   02/01/2015,11:03 AM

## 2015-02-02 DIAGNOSIS — J9621 Acute and chronic respiratory failure with hypoxia: Secondary | ICD-10-CM

## 2015-02-02 DIAGNOSIS — I509 Heart failure, unspecified: Secondary | ICD-10-CM

## 2015-02-02 DIAGNOSIS — R1084 Generalized abdominal pain: Secondary | ICD-10-CM

## 2015-02-02 LAB — GLUCOSE, CAPILLARY
GLUCOSE-CAPILLARY: 134 mg/dL — AB (ref 65–99)
GLUCOSE-CAPILLARY: 209 mg/dL — AB (ref 65–99)

## 2015-02-02 LAB — BRAIN NATRIURETIC PEPTIDE: B Natriuretic Peptide: 2924.8 pg/mL — ABNORMAL HIGH (ref 0.0–100.0)

## 2015-02-02 LAB — BASIC METABOLIC PANEL
ANION GAP: 14 (ref 5–15)
BUN: 42 mg/dL — ABNORMAL HIGH (ref 6–20)
CALCIUM: 8.5 mg/dL — AB (ref 8.9–10.3)
CO2: 27 mmol/L (ref 22–32)
CREATININE: 3.55 mg/dL — AB (ref 0.44–1.00)
Chloride: 93 mmol/L — ABNORMAL LOW (ref 101–111)
GFR calc Af Amer: 14 mL/min — ABNORMAL LOW (ref >60)
GFR, EST NON AFRICAN AMERICAN: 12 mL/min — AB (ref >60)
GLUCOSE: 145 mg/dL — AB (ref 65–99)
Potassium: 4.6 mmol/L (ref 3.5–5.1)
SODIUM: 134 mmol/L — AB (ref 135–145)

## 2015-02-02 LAB — CBC
HEMATOCRIT: 38.3 % (ref 36.0–46.0)
Hemoglobin: 12.7 g/dL (ref 12.0–15.0)
MCH: 28.3 pg (ref 26.0–34.0)
MCHC: 33.2 g/dL (ref 30.0–36.0)
MCV: 85.5 fL (ref 78.0–100.0)
Platelets: 675 10*3/uL — ABNORMAL HIGH (ref 150–400)
RBC: 4.48 MIL/uL (ref 3.87–5.11)
RDW: 16.2 % — ABNORMAL HIGH (ref 11.5–15.5)
WBC: 9 10*3/uL (ref 4.0–10.5)

## 2015-02-02 LAB — PROTIME-INR
INR: 2.31 — AB (ref 0.00–1.49)
Prothrombin Time: 25.2 seconds — ABNORMAL HIGH (ref 11.6–15.2)

## 2015-02-02 MED ORDER — FLUCONAZOLE 100 MG PO TABS: 100.0000 mg | ORAL_TABLET | Freq: Every day | ORAL | Status: DC

## 2015-02-02 MED ORDER — ISOSORB DINITRATE-HYDRALAZINE 20-37.5 MG PO TABS: 2.0000 | ORAL_TABLET | Freq: Three times a day (TID) | ORAL | Status: DC

## 2015-02-02 MED ORDER — SACCHAROMYCES BOULARDII 250 MG PO CAPS: 250.0000 mg | ORAL_CAPSULE | Freq: Two times a day (BID) | ORAL | Status: DC

## 2015-02-02 MED ORDER — CARVEDILOL 12.5 MG PO TABS: 12.5000 mg | ORAL_TABLET | Freq: Two times a day (BID) | ORAL | Status: DC

## 2015-02-02 MED ORDER — WARFARIN SODIUM 1 MG PO TABS
1.0000 mg | ORAL_TABLET | Freq: Every day | ORAL | Status: DC
  Filled 2015-02-02: qty 1

## 2015-02-02 MED ORDER — LOPERAMIDE HCL 2 MG PO CAPS: 2.0000 mg | ORAL_CAPSULE | Freq: Two times a day (BID) | ORAL | Status: DC | PRN

## 2015-02-02 MED ORDER — FUROSEMIDE 80 MG PO TABS: 160.0000 mg | ORAL_TABLET | Freq: Two times a day (BID) | ORAL | Status: DC

## 2015-02-02 MED ORDER — WARFARIN SODIUM 1 MG PO TABS: 1.0000 mg | ORAL_TABLET | Freq: Every evening | ORAL | Status: DC

## 2015-02-02 MED ORDER — SODIUM BICARBONATE 650 MG PO TABS: 650.0000 mg | ORAL_TABLET | Freq: Two times a day (BID) | ORAL | Status: DC

## 2015-02-02 MED ORDER — FAMOTIDINE 20 MG PO TABS: 20.0000 mg | ORAL_TABLET | Freq: Every day | ORAL | Status: DC

## 2015-02-02 MED ORDER — INSULIN LISPRO 100 UNIT/ML (KWIKPEN): 1.0000 [IU] | PEN_INJECTOR | Freq: Three times a day (TID) | SUBCUTANEOUS | Status: AC

## 2015-02-02 MED ORDER — PROMETHAZINE HCL 25 MG PO TABS: 25.0000 mg | ORAL_TABLET | ORAL | Status: DC | PRN

## 2015-02-02 NOTE — Progress Notes (Signed)
NURSING PROGRESS NOTE  [name] [id number] Discharge Data: 02/02/2015 3:42 PM Attending Provider: Mendel Corning, MD [facility], CAMMIE, MD   [place] to be D/C'd Home per MD order with hard copies of all prescriptions, and was taken out via wheelchair by nurse tech [place] with all personal belongings.    All IV's will be discontinued and monitored for bleeding.  All belongings will be returned to patient for patient to take home.  Last Documented Vital Signs:  Blood pressure 148/64, pulse 62, temperature 98.3 F (36.8 C), temperature source Oral, resp. rate 15, height 5' 2.4" (1.585 m), weight 91 kg (200 lb 9.9 oz), SpO2 100 %.  [name] RN, BS, BSN

## 2015-02-02 NOTE — Care Management Note (Signed)
Case Management Note  Patient Details  Name: [name] MRN: [id number] Date of Birth: 07/02/1946  Subjective/Objective:                   Abdominal pain nausea vomiting and diarrhea and fall Action/Plan: Discharge planning  Expected Discharge Date:  02/02/15               Expected Discharge Plan:  [name]  In-House Referral:  Clinical Social Work  Discharge planning Services  CM Consult  Post Acute Care Choice:  Home Health Choice offered to:     DME Arranged:    DME Agency:     HH Arranged:  RN, PT, OT, Nurse's Aide [place] Agency:  [place]  Status of Service:  Completed, signed off  Medicare Important Message Given:  Yes-second notification given Date Medicare IM Given:  01/23/15 Medicare IM give by:  debbie swist rn bsn cm Date Additional Medicare IM Given:    Additional Medicare Important Message give by:     If discussed at [facility] of Stay Meetings, dates discussed:    Additional Comments: CM met with pt in room to offer choice of home health agency.  Pt chooses [facility] to render HHPT/OT/RN/aide.  Address and contact information verified by pt.  Referral called to [facility] rep, Tiffany.  No other CM needs were communicated. Dellie Catholic, RN 02/02/2015, 10:26 AM

## 2015-02-02 NOTE — Progress Notes (Signed)
Patient stated she contacted her ride for discharge and the person will be here early this evening to pick her up.

## 2015-02-02 NOTE — Discharge Summary (Signed)
Physician Discharge Summary   Patient ID: [name] MRN: [id number] DOB/AGE: 1946/01/12 69 y.o.  Admit date: 01/20/2015 Discharge date: 02/02/2015  Primary Care Physician:  Antony Blackbird, MD  Discharge Diagnoses:   Acute on chronic systolic, diastolic CHF exacerbation  . Lactic acidosis . Anemia, iron deficiency . Chronic diastolic heart failure . Diabetes mellitus with renal manifestation . Essential hypertension . GERD . Obesity . Psoriasis . Diarrhea . NAUSEA AND VOMITING . Sepsis . Protein-calorie malnutrition, severe . DCM (dilated cardiomyopathy)  Consults:  Cardiology, Dr. Benjamine Mola Gastroenterology, Dr. Carlean Purl Nephrology  Outpatient Follow-up:  . Patient was reminded to follow up with cardiology in 1 week for Coumadin clinic, follow PT/INR, Coumadin dosing needs to be adjusted   TESTS THAT NEED FOLLOW up -BMET, hemoglobin A1c    DIET:  car modified dietergies:   Allergies  Allergen Reactions  . Adhesive [Tape] Other (See Comments)    Blisters   . Avelox [Moxifloxacin Hcl In Nacl] Other (See Comments)    GI upset  . Codeine Other (See Comments)    "crazy"   . Guaifenesin Nausea And Vomiting    Takes Mucinex at home without issue  . Latex Swelling  . Oxycodone Nausea And Vomiting    Takes Percocet at home without issue     Discharge Medications:   Medication List    STOP taking these medications        hydrALAZINE 50 MG tablet  Commonly known as:  APRESOLINE     Insulin Glargine 300 UNIT/ML Sopn     metolazone 5 MG tablet  Commonly known as:  ZAROXOLYN     metoprolol succinate 25 MG 24 hr tablet  Commonly known as:  TOPROL-XL     pregabalin 100 MG capsule  Commonly known as:  LYRICA      TAKE these medications        acetaminophen 500 MG tablet  Commonly known as:  TYLENOL  Take 1,000 mg by mouth 2 (two) times daily as needed (pain).     albuterol (2.5 MG/3ML) 0.083% nebulizer solution  Commonly known as:  PROVENTIL  Take  2.5 mg by nebulization every 2 (two) hours as needed for wheezing or shortness of breath.     albuterol 108 (90 BASE) MCG/ACT inhaler  Commonly known as:  PROVENTIL HFA;VENTOLIN HFA  Inhale 2 puffs into the lungs every 4 (four) hours as needed for wheezing or shortness of breath.     aspirin EC 81 MG tablet  Take 81 mg by mouth daily.     atorvastatin 20 MG tablet  Commonly known as:  LIPITOR  Take 20 mg by mouth at bedtime.     BEANO PO  Take 1 tablet by mouth daily.     carvedilol 12.5 MG tablet  Commonly known as:  COREG  Take 1 tablet (12.5 mg total) by mouth 2 (two) times daily with a meal.     clotrimazole-betamethasone cream  Commonly known as:  LOTRISONE  Apply 1 application topically 2 (two) times daily as needed (eczema).     esomeprazole 40 MG capsule  Commonly known as:  NEXIUM  Take 1 capsule (40 mg total) by mouth 2 (two) times daily.     famotidine 20 MG tablet  Commonly known as:  PEPCID  Take 1 tablet (20 mg total) by mouth daily.     ferrous sulfate 325 (65 FE) MG tablet  Take 325 mg by mouth daily with breakfast.     fluconazole 100  MG tablet  Commonly known as:  DIFLUCAN  Take 1 tablet (100 mg total) by mouth daily. X 6days     fluocinonide cream 0.05 %  Commonly known as:  LIDEX  Apply 1 application topically 2 (two) times daily.     fluticasone 50 MCG/ACT nasal spray  Commonly known as:  FLONASE  Place 2 sprays into both nostrils daily as needed for allergies or rhinitis.     Fluticasone-Salmeterol 250-50 MCG/DOSE Aepb  Commonly known as:  ADVAIR  Inhale 1 puff into the lungs 2 (two) times daily.     furosemide 80 MG tablet  Commonly known as:  LASIX  Take 2 tablets (160 mg total) by mouth 2 (two) times daily.     HYDROcodone-acetaminophen 5-325 MG per tablet  Commonly known as:  NORCO/VICODIN  Take 1 tablet by mouth every 6 (six) hours as needed for moderate pain.     insulin lispro 100 UNIT/ML KiwkPen  Commonly known as:  HUMALOG  KWIKPEN  Inject 0.01-0.1 mLs (1-10 Units total) into the skin 3 (three) times daily. Sliding scale CBG 70 - 120: 0 units CBG 121 - 150: 1 unit,  CBG 151 - 200: 2 units,  CBG 201 - 250: 3 units,  CBG 251 - 300: 5 units,  CBG 301 - 350: 7 units,  CBG 351 - 400: 9 units   CBG > 400: 10 units and notify your MD     isosorbide-hydrALAZINE 20-37.5 MG per tablet  Commonly known as:  BIDIL  Take 2 tablets by mouth 3 (three) times daily.     loperamide 2 MG capsule  Commonly known as:  IMODIUM  Take 1 capsule (2 mg total) by mouth 2 (two) times daily as needed for diarrhea or loose stools.     multivitamin with minerals tablet  Take 1 tablet by mouth daily.     ondansetron 4 MG tablet  Commonly known as:  ZOFRAN  Take 1 tablet (4 mg total) by mouth every 8 (eight) hours as needed for nausea or vomiting.     OTEZLA 30 MG Tabs  Generic drug:  Apremilast  Take 30 mg by mouth daily.     promethazine 25 MG tablet  Commonly known as:  PHENERGAN  Take 1 tablet (25 mg total) by mouth every 4 (four) hours as needed for nausea or vomiting.     saccharomyces boulardii 250 MG capsule  Commonly known as:  FLORASTOR  Take 1 capsule (250 mg total) by mouth 2 (two) times daily.     sodium bicarbonate 650 MG tablet  Take 1 tablet (650 mg total) by mouth 2 (two) times daily.     triamcinolone cream 0.1 %  Commonly known as:  KENALOG  Apply 1 application topically 2 (two) times daily.     warfarin 1 MG tablet  Commonly known as:  COUMADIN  Take 1 tablet (1 mg total) by mouth every evening. Dose to be adjusted         Brief H and P: For complete details please refer to admission H and P, but in brief [name] is a 69 y.o. female with Past medical history of COPD, coronary artery disease, GERD, gout, obesity, chronic diastolic CHF, obstructive sleep apnea, type 2 diabetes mellitus, essential hypertension, chronic kidney disease. The patient is presenting with complaints of abdominal pain  as well as nausea vomiting and diarrhea on ongoing since last 2 months progressively worsening leading to weakness and a fall. Patient has  been seen by GI: no further work up, cardiology: stress test 6/26 and nephrology. Patient's Cr has responded to high dose lasix and has continued to diurese.   Hospital Course:  Nausea, vomiting, diarrhea and abd pain: with inability to keep things down, ? ischemic colitis -patient has been PPI for the last 2-3 weeks with no significant improvement; No endoscopic procedure in the last 5 years. Patient was admitted for further workup. GI pathogen panel was negative. Due to elevated lactic acid, elevated procalcitonin and low grade temp; patient was started on Vanco, Zosyn and Flagyl. However no clear source of infection was found hence antibiotics have been discontinued. Patient's symptoms could be explained by NSTEMI and severe dehydration. Gastroenterology, Dr. Carlean Purl was consulted and since the patient was improving, no bleeding, no endoscopy was recommended.   Acute on chronic CHF (combined disease): EF 30-35% and NSTEMI Troponins were elevated at 3.17. 2-D echo was obtained which showed EF of 30-35% with apical hypokinesis and apical thrombus. Patient has a history of coronary artery disease. Cardiology was consulted, patient underwent stress test on 6/26 which showed intermediate risk study with EF of 47%.  Cardiac cath showed preserved cardiac output with moderately elevated RV filling pressure, mildly elevated LV filling pressure, moderate pulmonary hypertension - Cardiology recommended BiDil and Coreg, Lasix 160 mg oral twice a day - Cardiology recommended coumadin for 2 months and repeat echo Patient is in negative balance of 2.48 L at the time of discharge. Cardiology clinic will set up appointment within next week for the Coumadin clinic and cardiology follow-up appointment, sent request to [place].  -BP overall stable -will continue current  meds  LV thrombus - Patient started on warfarin, cardiology recommended Coumadin for 2 months and repeat echo  Acute on CKD stage 4: will monitor Cr trend -renal following, creatinine function appears to have plateaued at 3.1-3.2 - Follow creatinine function with diuresis  chronic resp failure due to COPD:  - continue oxygen supplementation- wears 2 L at home, currently no wheezing -will continue home bronchodilators regimen  Diabetes mellitus with nephropathy: continue SSI  Acute gout in the left foot - Continue prednisone, states the pain is improving significantly  Yeast infection -Continue Diflucan    Day of Discharge BP 163/80 mmHg  Pulse 66  Temp(Src) 97.5 F (36.4 C) (Oral)  Resp 18  Ht 5' 2.4" (1.585 m)  Wt 91 kg (200 lb 9.9 oz)  BMI 36.22 kg/m2  SpO2 98%  Physical Exam: General: Alert and awake oriented x3 not in any acute distress. HEENT: anicteric sclera, pupils reactive to light and accommodation CVS: S1-S2 clear no murmur rubs or gallops Chest: clear to auscultation bilaterally, no wheezing rales or rhonchi Abdomen: soft nontender, nondistended, normal bowel sounds Extremities: no cyanosis, clubbing or edema noted bilaterally Neuro: Cranial nerves II-XII intact, no focal neurological deficits   The results of significant diagnostics from this hospitalization (including imaging, microbiology, ancillary and laboratory) are listed below for reference.    LAB RESULTS: Basic Metabolic Panel:  Recent Labs Lab 02/01/15 0546 02/02/15 0517  NA 132* 134*  K 4.1 4.6  CL 94* 93*  CO2 27 27  GLUCOSE 122* 145*  BUN 35* 42*  CREATININE 3.21* 3.55*  CALCIUM 8.1* 8.5*   Liver Function Tests: No results for input(s): AST, ALT, ALKPHOS, BILITOT, PROT, ALBUMIN in the last 168 hours. No results for input(s): LIPASE, AMYLASE in the last 168 hours. No results for input(s): AMMONIA in the last 168 hours. CBC:  Recent  Labs Lab 02/01/15 0720 02/02/15 0517   WBC 6.3 9.0  HGB 15.4* 12.7  HCT 47.3* 38.3  MCV 86.3 85.5  PLT 357 675*   Cardiac Enzymes:  Recent Labs Lab 01/30/15 1702  TROPONINI 0.29*   BNP: Invalid input(s): POCBNP CBG:  Recent Labs Lab 02/02/15 0819 02/02/15 1153  GLUCAP 134* 209*    Significant Diagnostic Studies:  Ct Abdomen Pelvis Wo Contrast  01/20/2015   CLINICAL DATA:  69 year old female with 3-4 week history of nausea, vomiting, diarrhea, weakness and abdominal pain while eating. History of trauma from a fall today.  EXAM: CT ABDOMEN AND PELVIS WITHOUT CONTRAST  TECHNIQUE: Multidetector CT imaging of the abdomen and pelvis was performed following the standard protocol without IV contrast.  COMPARISON:  CT of the abdomen and pelvis 12/21/2014.  FINDINGS: Lower chest: Mild cylindrical bronchiectasis, thickening of the peribronchovascular interstitium and architectural distortion in the lower lobes of the lungs bilaterally, which could be post infectious, or could be related to recurrent aspiration. Mild cardiomegaly. Atherosclerotic calcifications in the left main and left anterior descending coronary artery.  Hepatobiliary: Status post cholecystectomy. Irregular area of low attenuation and calcification in the posterior aspect of the right lobe of the liver is similar to remote prior studies dating back to at least 08/25/2013, favored to be related to sequela of remote trauma. No new hepatic lesions.  Pancreas: No definite pancreatic mass or surrounding inflammatory changes on today's noncontrast CT examination.  Spleen: Status post splenectomy. Multiple small splenules noted in the left side of the abdomen.  Adrenals/Urinary Tract: The unenhanced appearance of the adrenal glands bilaterally and the right kidney is normal. Mild scarring along the lateral aspect of the upper pole of the left kidney, likely related to prior splenectomy. Left kidney is otherwise normal in appearance. No hydroureteronephrosis. Urinary bladder  is normal in appearance.  Stomach/Bowel: The unenhanced appearance of the stomach is normal. No pathologic dilatation of small bowel or colon. A few scattered colonic diverticulae are noted, without surrounding inflammatory changes to suggest an acute diverticulitis at this time.  Vascular/Lymphatic: Moderate atherosclerosis throughout the abdominal and pelvic vasculature, without definite aneurysm. No lymphadenopathy noted in the abdomen or pelvis on today's noncontrast CT examination.  Reproductive: Status post hysterectomy.  Ovaries are atrophic.  Other: No significant volume of ascites.  No pneumoperitoneum.  Musculoskeletal: There are no aggressive appearing lytic or blastic lesions noted in the visualized portions of the skeleton.  IMPRESSION: 1. No acute findings in the abdomen or pelvis on today's noncontrast CT examination to account for the patient's symptoms. 2. The appearance of the lower lobes of the lungs could suggest post infectious scarring, but could also be seen in the setting of recurrent aspiration. Clinical correlation is suggested. 3. Mild cardiomegaly. 4. Atherosclerosis, including left anterior descending coronary artery disease. Please note that although the presence of coronary artery calcium documents the presence of coronary artery disease, the severity of this disease and any potential stenosis cannot be assessed on this non-gated CT examination. Assessment for potential risk factor modification, dietary therapy or pharmacologic therapy may be warranted, if clinically indicated. 5. Status post splenectomy. Multiple splenules are noted throughout the left side of the abdomen. 6. Status post cholecystectomy. 7. Additional incidental findings, as above.   Electronically Signed   By: Vinnie Langton M.D.   On: 01/20/2015 19:38    2D ECHO: Study Conclusions  - Left ventricle: The cavity size was normal. Systolic function was moderately to severely reduced. The estimated  ejection  fraction was in the range of 30% to 35%. Wall motion was normal; there were no regional wall motion abnormalities. Doppler parameters are consistent with abnormal left ventricular relaxation (grade 1 diastolic dysfunction). Acoustic contrast opacification revealed a medium-sized, 2.2 cm (L) x 1.2 cm (W), apicalthrombusassociated with a hypokinetic segment. - Mitral valve: There was mild regurgitation. - Right ventricle: Systolic function was moderately reduced. - Pulmonary arteries: Systolic pressure was moderately increased. PA peak pressure: 62 mm Hg (S). - Pericardium, extracardiac: A trivial pericardial effusion was identified posterior to the heart.  Disposition and Follow-up:     Discharge Instructions    (HEART FAILURE PATIENTS) Call MD:  Anytime you have any of the following symptoms: 1) 3 pound weight gain in 24 hours or 5 pounds in 1 week 2) shortness of breath, with or without a dry hacking cough 3) swelling in the hands, feet or stomach 4) if you have to sleep on extra pillows at night in order to breathe.    Complete by:  As directed      Diet Carb Modified    Complete by:  As directed      Discharge instructions    Complete by:  As directed   It is VERY IMPORTANT that you follow up with a PCP on a regular basis.  Check your blood glucoses before each meal and at bedtime and maintain a log of your readings.  Bring this log with you when you follow up with your PCP so that he or she can adjust your insulin at your follow up visit.   Please call [facility] office on Tuesday morning 7/5 to confirm your appt with coumadin clinic and cardiologist     Increase activity slowly    Complete by:  As directed             DISPOSITION: HOME with home health   DISCHARGE FOLLOW-UP Follow-up Information    Follow up with FULP, CAMMIE, MD. Schedule an appointment as soon as possible for a visit in 10 days.   Specialty:  Family Medicine   Why:  for hospital  follow-up, your insulin dose needs to be adjusted   Contact information:   [address] [address] [phone]       Follow up with Loralie Champagne, MD. Schedule an appointment as soon as possible for a visit in 1 week.   Specialty:  Cardiology   Why:  for hospital follow-up. You need appt with coumadin clinic next week to follow PT/INR and coumadin dosing    Contact information:   [address] [place] [address] [phone]       Follow up with [facility].   Why:  home health physical and occupational therapy, nurse and aide   Contact information:   [address] [place] [address] [phone]        Time spent on Discharge 35 minutes   Signed:   Tameko Halder M.D. [facility] 02/02/2015, 12:44 PM Pager: [phone]

## 2015-02-02 NOTE — Progress Notes (Signed)
Patient ID: [name], female   DOB: 1946-03-30, 69 y.o.   MRN: [id number]  Advanced Heart Failure Rounding Note   Subjective:    69 yo with history of chronic diastolic CHF (has been seen in CHF clinic), HTN, DM, CKD IV, OHS/OSA, sarcoidosis followed by Dr Lenna Gilford was admitted with abdominal pain/weakness/nausea/vomiting and increased lactate. She developed AKI with rise in creatinine up to 4, followed by nephrology. She was also noted to have fall in EF by echo, now 30-35% with apical hypokinesis and an apical thrombus, moderate RV dysfunction with PASP 68. GI symptoms not fully understood and reason for worsening creatinine not fully understood.   Cardiolite 6/26 with no definite ischemia or infarction, EF 47% (?accuracy of EF).   Doing better.  Gout pain in foot resolved, able to walk yesterday.  Completed prednisone burst.  Breathing better, still some dyspnea with exertion.  RHC Procedural Findings (6/29): Hemodynamics (mmHg) RA mean 13 RV 74/15 PA 66/26, mean 39 PCWP mean 16 Oxygen saturations: PA 64% AO 97% Cardiac Output (Fick) 4.71  Cardiac Index (Fick) 2.43 PVR 4.9 WU Cardiac Output (Thermo) 4.82 Cardiac Index (Thermo) 2.48  PVR 4.8 WU    Objective:   Weight Range: 91 kg (200 lb 9.9 oz) Body mass index is 36.22 kg/(m^2).   Vital Signs:   Temp:  [97.5 F (36.4 C)-98.2 F (36.8 C)] 97.5 F (36.4 C) (07/03 0510) Pulse Rate:  [66] 66 (07/03 0510) Resp:  [18] 18 (07/03 0510) BP: (119-163)/(47-80) 163/80 mmHg (07/03 0510) SpO2:  [95 %-100 %] 100 % (07/03 0510) Weight:  [91 kg (200 lb 9.9 oz)] 91 kg (200 lb 9.9 oz) (07/03 0510) Last BM Date: 01/31/15  Weight change: Filed Weights   01/31/15 0528 02/01/15 0553 02/02/15 0510  Weight: 92.6 kg (204 lb 2.3 oz) 90.1 kg (198 lb 10.2 oz) 91 kg (200 lb 9.9 oz)    Intake/Output:   Intake/Output Summary (Last 24 hours) at 02/02/15 0855 Last data filed at 02/02/15 0848  Gross per 24 hour  Intake    960 ml   Output    550 ml  Net    410 ml     Physical Exam General: NAD Neck: JVP 10, no thyromegaly or thyroid nodule.  Lungs: Clear to auscultation bilaterally with normal respiratory effort. CV: Nondisplaced PMI. Heart regular S1/S2, wide split S2, no S3/S4, 1/6 SEM apex. 1+ ankle edema. No carotid bruit. Normal pedal pulses.  Abdomen: Soft, non tender today, no hepatosplenomegaly, no distention.  Neurologic: Alert and oriented x 3.  Psych: Normal affect. Extremities: No clubbing or cyanosis. Right hand swelling.   TELEMETRY: NSR  Labs: CBC  Recent Labs  02/01/15 0720 02/02/15 0517  WBC 6.3 9.0  HGB 15.4* 12.7  HCT 47.3* 38.3  MCV 86.3 85.5  PLT 357 810*   Basic Metabolic Panel  Recent Labs  02/01/15 0546 02/02/15 0517  NA 132* 134*  K 4.1 4.6  CL 94* 93*  CO2 27 27  GLUCOSE 122* 145*  BUN 35* 42*  CALCIUM 8.1* 8.5*   Liver Function Tests No results for input(s): AST, ALT, ALKPHOS, BILITOT, PROT, ALBUMIN in the last 72 hours. No results for input(s): LIPASE, AMYLASE in the last 72 hours. Cardiac Enzymes  Recent Labs  01/30/15 1702  TROPONINI 0.29*    BNP: BNP (last 3 results)  Recent Labs  09/03/14 0428 01/21/15 1830 02/02/15 0517  BNP 1107.3* 1056.8* 2924.8*    ProBNP (last 3 results)  Recent Labs  04/26/14 2358 06/24/14 0945  PROBNP 3725.0* 324.0*     D-Dimer No results for input(s): DDIMER in the last 72 hours. Hemoglobin A1C No results for input(s): HGBA1C in the last 72 hours. Fasting Lipid Panel No results for input(s): CHOL, HDL, LDLCALC, TRIG, CHOLHDL, LDLDIRECT in the last 72 hours. Thyroid Function Tests No results for input(s): TSH, T4TOTAL, T3FREE, THYROIDAB in the last 72 hours.  Invalid input(s): FREET3   Medications:     Scheduled Medications: . antiseptic oral rinse  7 mL Mouth Rinse BID  . aspirin EC  81 mg Oral Daily  . atorvastatin  20 mg Oral QHS  . calcium carbonate  1 tablet Oral TID WC  .  carvedilol  12.5 mg Oral BID WC  . clotrimazole  1 Applicatorful Vaginal QHS  . famotidine  20 mg Oral Daily  . fluconazole  100 mg Oral Daily  . furosemide  160 mg Oral BID  . insulin aspart  0-5 Units Subcutaneous QHS  . insulin aspart  0-9 Units Subcutaneous TID WC  . isosorbide-hydrALAZINE  2 tablet Oral TID  . loperamide  2 mg Oral BID  . mometasone-formoterol  2 puff Inhalation BID  . predniSONE  40 mg Oral Q breakfast  . saccharomyces boulardii  250 mg Oral BID  . sodium bicarbonate  650 mg Oral BID  . sodium chloride  3 mL Intravenous Q12H  . Warfarin - Pharmacist Dosing Inpatient   Does not apply q1800    Infusions: . sodium chloride 20 mL/hr at 01/24/15 1044  . sodium chloride Stopped (01/29/15 2305)    PRN Medications: sodium chloride, acetaminophen **OR** acetaminophen, albuterol, camphor-menthol, diphenhydrAMINE, fluticasone, gi cocktail, HYDROcodone-acetaminophen, hydrocortisone cream, ondansetron **OR** ondansetron (ZOFRAN) IV   Assessment/Plan   69 yo with history of chronic diastolic CHF (has been seen in CHF clinic), HTN, DM, CKD IV, OHS/OSA, sarcoidosis followed by Dr Lenna Gilford was admitted with abdominal pain/weakness/nausea/vomiting and increased lactate. She developed AKI with rise in creatinine up to 4, followed by nephrology. She was also noted to have fall in EF by echo, now 30-35% with apical hypokinesis and an apical thrombus.  1. Cardiomyopathy/acute systolic CHF: New fall in EF to 30-35% with apical hypokinesis and apical thrombus. She has prior history of CAD (without significant disease in major vessels, however). Suspect ischemic cardiomyopathy with MI involving the apex in the past, but Cardiolite this admission actually showed no definitive ischemia or prior infarction. RHC showed preserved cardiac output with moderately elevated RV filling pressure, mildly elevated LV filling pressure, and moderate pulmonary hypertension.  Stable no on po Lasix.  -  Continue Bidil, increase Coreg to 12.5 mg bid.  - Continue Lasix 160 mg bid.   2. CAD: Known from the past. She has chest pain today but very pleuritic.  Do not think this is ischemic. Still suspect prior MI as cause of EF fall with apical thrombus. However, no ischemia on Cardiolite.  - Not candidate for coronary angiography at this point with AKI on CKD.  - Continue ASA, statin.  3. Abdominal pain/elevated lactate: Cause still unclear. No definite etiology from abdominal CT. ?mesenteric embolism from LV thrombus. Symptoms resolved. 4. AKI on CKD IV: Unsure etiology of worsening. Cardiac output preserved by RHC.  - Creatinine now stable.  5. 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 Suspect there is a component of group 3 PAH from OHS/OSA. Sarcoidosis may also play a role in her PAH, but not sure how active this actually is.  -  For now, treat with 02 and CPAP for OHS/OSA.  - Could consider ERA down the road if sarcoidosis is thought to play a major role in her [place].  6. LV thrombus: On warfarin, therapeutic INR.  7. Gout flare: Suspected in left foot.  Improved with prednisone burst, now off.  8. Disposition: When she goes home, will need followup in [place] coumadin clinic (this week), followup in CHF clinic in 10-14 days.  Would continue her current cardiac meds: Bidil 2 tabs tid, Coreg 12.5 bid, ASA 81, warfarin, atorvastatin 20, Lasix 160 bid.   Length of Stay: [place] 02/02/2015, 8:55 AM  Advanced Heart Failure Team Pager [phone] (M-F; 7a - 4p)  Please contact [facility] for night-coverage after hours (4p -7a ) and weekends on [email or web address]

## 2015-02-02 NOTE — Progress Notes (Signed)
ANTICOAGULATION CONSULT NOTE - Follow Up Consult  Pharmacy Consult for Coumadin Indication: LV thrombus  Allergies  Allergen Reactions  . Adhesive [Tape] Other (See Comments)    Blisters   . Avelox [Moxifloxacin Hcl In Nacl] Other (See Comments)    GI upset  . Codeine Other (See Comments)    "crazy"   . Guaifenesin Nausea And Vomiting    Takes Mucinex at home without issue  . Latex Swelling  . Oxycodone Nausea And Vomiting    Takes Percocet at home without issue    Patient Measurements: Height: 5' 2.4" (158.5 cm) Weight: 200 lb 9.9 oz (91 kg) IBW/kg (Calculated) : 51.02  Vital Signs: Temp: 97.5 F (36.4 C) (07/03 0510) Temp Source: Oral (07/03 0510) BP: 163/80 mmHg (07/03 0510) Pulse Rate: 66 (07/03 0510)  Labs:  Recent Labs  01/30/15 1702 01/31/15 0432 02/01/15 0546 02/01/15 0720 02/02/15 0517  HGB  --   --   --  15.4* 12.7  HCT  --   --   --  47.3* 38.3  PLT  --   --   --  357 675*  LABPROT  --  27.0* 26.7*  --  25.2*  INR  --  2.54* 2.50*  --  2.31*  CREATININE  --  3.16* 3.21*  --  3.55*  TROPONINI 0.29*  --   --   --   --     Estimated Creatinine Clearance: 16.5 mL/min (by C-G formula based on Cr of 3.55).  Assessment: 68 yof continues on coumadin for LV thrombus. INR is therapeutic at 2.31. She appears to be very sensitive to small dose changes in coumadin. PO fluconazole added 6/29 which can increase INR so will continue to watch for interaction. No bleeding reported. Plan for discharge today  Goal of Therapy:  INR 2-3 Monitor platelets by anticoagulation protocol: Yes   Plan:  1) Coumadin 1mg  po daily 2) Daily INR  Maryanna Shape, PharmD, BCPS  Clinical Pharmacist  Pager: [phone]   02/02/2015,11:30 AM

## 2015-02-05 ENCOUNTER — Ambulatory Visit (INDEPENDENT_AMBULATORY_CARE_PROVIDER_SITE_OTHER): Payer: Medicare Other | Admitting: *Deleted

## 2015-02-05 DIAGNOSIS — Z5181 Encounter for therapeutic drug level monitoring: Secondary | ICD-10-CM | POA: Diagnosis not present

## 2015-02-05 DIAGNOSIS — I2109 ST elevation (STEMI) myocardial infarction involving other coronary artery of anterior wall: Secondary | ICD-10-CM | POA: Diagnosis not present

## 2015-02-05 DIAGNOSIS — I214 Non-ST elevation (NSTEMI) myocardial infarction: Secondary | ICD-10-CM

## 2015-02-05 DIAGNOSIS — I513 Intracardiac thrombosis, not elsewhere classified: Secondary | ICD-10-CM

## 2015-02-05 LAB — POCT INR: INR: 1.6

## 2015-02-05 NOTE — Patient Instructions (Signed)

## 2015-02-06 ENCOUNTER — Ambulatory Visit: Payer: Medicare Other | Admitting: Vascular Surgery

## 2015-02-06 ENCOUNTER — Encounter (HOSPITAL_COMMUNITY): Payer: Medicare Other

## 2015-02-07 ENCOUNTER — Telehealth: Payer: Self-pay | Admitting: Pulmonary Disease

## 2015-02-07 NOTE — Telephone Encounter (Signed)
ATC line busy wcb 

## 2015-02-10 NOTE — Telephone Encounter (Signed)
Message has been printed and placed on SN cart for review

## 2015-02-10 NOTE — Telephone Encounter (Signed)
Spoke with pt. She was d/c'd from hospital 02/02/15. She was not told anything regarding her prednisone. She reports before she was hospitalized she was taking pred 5 mg daily. She reports they did not give her a new RX when she was d/c'd and do not see any mention on this on her d/c summary. Pt is wanting to know if she is still to cont on prednisone? Please advise SN thanks

## 2015-02-11 ENCOUNTER — Ambulatory Visit (INDEPENDENT_AMBULATORY_CARE_PROVIDER_SITE_OTHER): Payer: Medicare Other | Admitting: Interventional Cardiology

## 2015-02-11 ENCOUNTER — Encounter (HOSPITAL_COMMUNITY): Payer: Self-pay

## 2015-02-11 ENCOUNTER — Ambulatory Visit (HOSPITAL_COMMUNITY)
Admission: RE | Admit: 2015-02-11 | Discharge: 2015-02-11 | Disposition: A | Discharge status: Home / Self Care | Payer: Medicare Other | Source: Ambulatory Visit | Attending: Cardiology | Admitting: Cardiology

## 2015-02-11 VITALS — BP 118/42 | HR 60 | Wt 218.0 lb

## 2015-02-11 DIAGNOSIS — I272 Other secondary pulmonary hypertension: Secondary | ICD-10-CM | POA: Insufficient documentation

## 2015-02-11 DIAGNOSIS — I214 Non-ST elevation (NSTEMI) myocardial infarction: Secondary | ICD-10-CM

## 2015-02-11 DIAGNOSIS — K219 Gastro-esophageal reflux disease without esophagitis: Secondary | ICD-10-CM | POA: Diagnosis not present

## 2015-02-11 DIAGNOSIS — E669 Obesity, unspecified: Secondary | ICD-10-CM | POA: Insufficient documentation

## 2015-02-11 DIAGNOSIS — E785 Hyperlipidemia, unspecified: Secondary | ICD-10-CM | POA: Diagnosis not present

## 2015-02-11 DIAGNOSIS — N184 Chronic kidney disease, stage 4 (severe): Secondary | ICD-10-CM | POA: Diagnosis not present

## 2015-02-11 DIAGNOSIS — I513 Intracardiac thrombosis, not elsewhere classified: Secondary | ICD-10-CM

## 2015-02-11 DIAGNOSIS — I270 Primary pulmonary hypertension: Secondary | ICD-10-CM

## 2015-02-11 DIAGNOSIS — I5022 Chronic systolic (congestive) heart failure: Secondary | ICD-10-CM | POA: Diagnosis present

## 2015-02-11 DIAGNOSIS — Z7982 Long term (current) use of aspirin: Secondary | ICD-10-CM | POA: Diagnosis not present

## 2015-02-11 DIAGNOSIS — N259 Disorder resulting from impaired renal tubular function, unspecified: Secondary | ICD-10-CM | POA: Diagnosis not present

## 2015-02-11 DIAGNOSIS — D869 Sarcoidosis, unspecified: Secondary | ICD-10-CM

## 2015-02-11 DIAGNOSIS — Z79899 Other long term (current) drug therapy: Secondary | ICD-10-CM | POA: Insufficient documentation

## 2015-02-11 DIAGNOSIS — L409 Psoriasis, unspecified: Secondary | ICD-10-CM | POA: Insufficient documentation

## 2015-02-11 DIAGNOSIS — G4733 Obstructive sleep apnea (adult) (pediatric): Secondary | ICD-10-CM | POA: Diagnosis not present

## 2015-02-11 DIAGNOSIS — I129 Hypertensive chronic kidney disease with stage 1 through stage 4 chronic kidney disease, or unspecified chronic kidney disease: Secondary | ICD-10-CM | POA: Diagnosis not present

## 2015-02-11 DIAGNOSIS — Z8249 Family history of ischemic heart disease and other diseases of the circulatory system: Secondary | ICD-10-CM | POA: Diagnosis not present

## 2015-02-11 DIAGNOSIS — D472 Monoclonal gammopathy: Secondary | ICD-10-CM | POA: Diagnosis not present

## 2015-02-11 DIAGNOSIS — Z5181 Encounter for therapeutic drug level monitoring: Secondary | ICD-10-CM

## 2015-02-11 DIAGNOSIS — I2109 ST elevation (STEMI) myocardial infarction involving other coronary artery of anterior wall: Secondary | ICD-10-CM

## 2015-02-11 DIAGNOSIS — I2510 Atherosclerotic heart disease of native coronary artery without angina pectoris: Secondary | ICD-10-CM | POA: Insufficient documentation

## 2015-02-11 LAB — POCT INR: INR: 1.3

## 2015-02-11 NOTE — Telephone Encounter (Signed)
Per SN: This is a good opportunity to stop the prednisone. Calcium levels were norm previously. Pt needs to scheduled 1 month follow up appt  Called pt and is aware of above. She is already scheduled for next month. Nothing further needed

## 2015-02-11 NOTE — Patient Instructions (Signed)
Non-Cardiac CT scanning, (CAT scanning), is a noninvasive, special x-ray that produces cross-sectional images of the body using x-rays and a computer. CT scans help physicians diagnose and treat medical conditions. For some CT exams, a contrast material is used to enhance visibility in the area of the body being studied. CT scans provide greater clarity and reveal more details than regular x-ray exams.  No medication changes at this time.  Your physician recommends that you schedule a follow-up appointment in: 2 weeks  Do the following things EVERYDAY: 1) Weigh yourself in the morning before breakfast. Write it down and keep it in a log. 2) Take your medicines as prescribed 3) Eat low salt foods-Limit salt (sodium) to 2000 mg per day.  4) Stay as active as you can everyday 5) Limit all fluids for the day to less than 2 liters 6)

## 2015-02-11 NOTE — Progress Notes (Signed)
Patient ID: [name], female   DOB: Dec 05, 1945, 69 y.o.   MRN: [id number] PCP: Dr Chapman Fitch Pulmonology: Dr Lenna Gilford  69 yo with history of OHS/OSA, sarcoidosis, CAD, suspected ischemic cardiomyopathy, LV apical thrombus, and CKD stage IV presents for cardiology followup.  She was admitted in 6/16 with abdominal pain, nausea, vomiting, elevated lactate, and AKI on CKD.  Echo showed EF decreased to 30-35% with LV apical thrombus.  She was started on anticoagulation.  RHC was done while she was in the hospital, showing moderate pulmonary hypertension and RV>LV failure.  Lexiscan cardiolite showed no ischemia and no definite infarction.  She was thought to have infectious colitis versus possible mesenteric cardioembolism.  She was diuresed carefully given rise in creatinine.    She is now home, living with her niece.  She is weak and fatigued generally.  No further abdominal pain and weight has been stable at home.  She is walking some in her house with a walker.  She can walk about 50 feet before getting short of breath and tired.  No chest pain.  No lightheadedness.  Using CPAP at night and 2L oxygen during the day.  No orthopnea.  SBP 120s-140 at home.   ECG; NSR, RBBB, septal Qs  Labs (7/16): K 4.6, creatinine 3.55  PMH: 1. OHS/OSA: On CPAP at night and oxygen during the day.  2. Psoriasis 3. Obesity 4. CKD stage IV 5. HTN 6. H/o sarcoidosis: Hypercalcemia, splenic involvement, s/p splenectomy.  No definite lung involvement on 9/15 chest CT.  7. Chronic systolic CHF: Suspect ischemic cardiomyopathy.  Echo (6/16) with EF 30-35%, apical thrombus, PA systolic pressure 62 mmHg.   8. LV apical thrombus 9. CAD: LHC (6/07) with 70-90% stenosis small OM.  See #7 above, suspect ischemia cardiomyopathy but no repeat cath given CKD.  Lexiscan Cardiolite (6/16) with EF 47%, inferolateral fixed defect may be attenuation, no ischemia.   10. GERD 11. Gout 12. Hyperlipidemia 13. 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 PAH:  RHC (6/16) with mean RA 13, PA 66/26 mean 39, mean PCWP 16, CI 2.43, PVR 4.9 WU.  ?OHS/OSA with pulmonary venous hypertension.  Cannot rule out role for sarcoidosis.   SH: Lives with niece, nonsmoker, no ETOH.   FH: CAD  ROS: All systems reviewed and negative except as per HPI.   Current Outpatient Prescriptions  Medication Sig Dispense Refill  . acetaminophen (TYLENOL) 500 MG tablet Take 1,000 mg by mouth 2 (two) times daily as needed (pain).     Marland Kitchen albuterol (PROVENTIL HFA;VENTOLIN HFA) 108 (90 BASE) MCG/ACT inhaler Inhale 2 puffs into the lungs every 4 (four) hours as needed for wheezing or shortness of breath.     Marland Kitchen albuterol (PROVENTIL) (2.5 MG/3ML) 0.083% nebulizer solution Take 2.5 mg by nebulization every 2 (two) hours as needed for wheezing or shortness of breath.    . Alpha-D-Galactosidase (BEANO PO) Take 1 tablet by mouth daily.    Marland Kitchen Apremilast (OTEZLA) 30 MG TABS Take 30 mg by mouth daily.     Marland Kitchen aspirin EC 81 MG tablet Take 81 mg by mouth daily.    Marland Kitchen atorvastatin (LIPITOR) 20 MG tablet Take 20 mg by mouth at bedtime.    . carvedilol (COREG) 12.5 MG tablet Take 1 tablet (12.5 mg total) by mouth 2 (two) times daily with a meal. 60 tablet 3  . clotrimazole-betamethasone (LOTRISONE) cream Apply 1 application topically 2 (two) times daily as needed (eczema).     . esomeprazole (NEXIUM) 40 MG  capsule Take 1 capsule (40 mg total) by mouth 2 (two) times daily. 60 capsule 3  . famotidine (PEPCID) 20 MG tablet Take 1 tablet (20 mg total) by mouth daily. 30 tablet 3  . ferrous sulfate 325 (65 FE) MG tablet Take 325 mg by mouth daily with breakfast.    . fluocinonide cream (LIDEX) 1.01 % Apply 1 application topically 2 (two) times daily.    . fluticasone (FLONASE) 50 MCG/ACT nasal spray Place 2 sprays into both nostrils daily as needed for allergies or rhinitis.     . Fluticasone-Salmeterol (ADVAIR) 250-50 MCG/DOSE AEPB Inhale 1 puff into the lungs 2 (two) times daily. 180 each 3  .  furosemide (LASIX) 80 MG tablet Take 2 tablets (160 mg total) by mouth 2 (two) times daily. 120 tablet 2  . HYDROcodone-acetaminophen (NORCO/VICODIN) 5-325 MG per tablet Take 1 tablet by mouth every 6 (six) hours as needed for moderate pain. 15 tablet 0  . insulin lispro (HUMALOG KWIKPEN) 100 UNIT/ML KiwkPen Inject 0.01-0.1 mLs (1-10 Units total) into the skin 3 (three) times daily. Sliding scale CBG 70 - 120: 0 units CBG 121 - 150: 1 unit,  CBG 151 - 200: 2 units,  CBG 201 - 250: 3 units,  CBG 251 - 300: 5 units,  CBG 301 - 350: 7 units,  CBG 351 - 400: 9 units   CBG > 400: 10 units and notify your MD 15 mL 3  . isosorbide-hydrALAZINE (BIDIL) 20-37.5 MG per tablet Take 2 tablets by mouth 3 (three) times daily. 90 tablet 3  . loperamide (IMODIUM) 2 MG capsule Take 1 capsule (2 mg total) by mouth 2 (two) times daily as needed for diarrhea or loose stools. 60 capsule 0  . Multiple Vitamins-Minerals (MULTIVITAMIN WITH MINERALS) tablet Take 1 tablet by mouth daily.    . ondansetron (ZOFRAN) 4 MG tablet Take 1 tablet (4 mg total) by mouth every 8 (eight) hours as needed for nausea or vomiting. 15 tablet 0  . promethazine (PHENERGAN) 25 MG tablet Take 1 tablet (25 mg total) by mouth every 4 (four) hours as needed for nausea or vomiting. 30 tablet 0  . saccharomyces boulardii (FLORASTOR) 250 MG capsule Take 1 capsule (250 mg total) by mouth 2 (two) times daily. 60 capsule 3  . sodium bicarbonate 650 MG tablet Take 1 tablet (650 mg total) by mouth 2 (two) times daily. 60 tablet 3  . triamcinolone cream (KENALOG) 0.1 % Apply 1 application topically 2 (two) times daily.    Marland Kitchen warfarin (COUMADIN) 1 MG tablet Take 1 tablet (1 mg total) by mouth every evening. Dose to be adjusted 30 tablet 0   No current facility-administered medications for this encounter.   BP 118/42 mmHg  Pulse 60  Wt 218 lb (98.884 kg)  SpO2 98% General: NAD, obese Neck: Thick, JVP 7-8 cm, no thyromegaly or thyroid nodule.  Lungs:  Crackles bilateral bases.  CV: Nondisplaced PMI.  Heart regular S1/S2, no S3/S4, no murmur.  1+ ankle edema.  No carotid bruit.  Normal pedal pulses.  Abdomen: Soft, nontender, no hepatosplenomegaly, no distention.  Skin: Intact without lesions or rashes.  Neurologic: Alert and oriented x 3.  Psych: Normal affect. Extremities: No clubbing or cyanosis.  HEENT: Normal.   Assessment/Plan: 1. Chronic systolic CHF: EF 75-10% on echo, suspected ischemic cardiomyopathy.  No ischemia on Cardiolite.  On exam today, she does not appear significantly volume overloaded.  - Continue Lasix 160 mg bid.   - Continue Coreg  12.5 mg bid and Bidil 2 tabs tid.  - No ACEI or spironolactone with CKD IV.  - Repeat echo in 10/16 after medical treatment.  2. CAD: Suspected ischemia cardiomyopathy.  No active ischemia on Cardiolite.  Would not cath with severe CKD.  - Continue ASA 81 daily + atorvastatin.  3. CKD: Stage IV, has fistula, has followup with nephrology. She had recent labs at [facility], will work on getting a copy.  4. 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  Suspect PAH component may be due to OHS/OSA, but cannot rule out a role for sarcoidosis.   - Should have V/Q scan to rule out chronic PEs (though already anticoagulated). - Continue CPAP and home oxygen.  - High resolution CT chest to assess for evidence of pulmonary sarcoidosis.   - Consider treatment with endothelin receptor antagonist (some evidence for use in sarcoidosis-related PAH).  5. Apical thrombus: Continue warfarin.  6. Abdominal pain: Resolved.  ?mesenteric thromboembolism.   Loralie Champagne 02/11/2015

## 2015-02-14 ENCOUNTER — Telehealth (HOSPITAL_COMMUNITY): Payer: Self-pay | Admitting: *Deleted

## 2015-02-14 ENCOUNTER — Ambulatory Visit (HOSPITAL_COMMUNITY)
Admission: RE | Admit: 2015-02-14 | Discharge: 2015-02-14 | Disposition: A | Discharge status: Home / Self Care | Payer: Medicare Other | Source: Ambulatory Visit | Attending: Cardiology | Admitting: Cardiology

## 2015-02-14 DIAGNOSIS — R0602 Shortness of breath: Secondary | ICD-10-CM | POA: Diagnosis not present

## 2015-02-14 DIAGNOSIS — K769 Liver disease, unspecified: Secondary | ICD-10-CM | POA: Insufficient documentation

## 2015-02-14 DIAGNOSIS — Z7901 Long term (current) use of anticoagulants: Secondary | ICD-10-CM | POA: Insufficient documentation

## 2015-02-14 DIAGNOSIS — D869 Sarcoidosis, unspecified: Secondary | ICD-10-CM | POA: Diagnosis present

## 2015-02-14 DIAGNOSIS — I2510 Atherosclerotic heart disease of native coronary artery without angina pectoris: Secondary | ICD-10-CM | POA: Diagnosis not present

## 2015-02-14 NOTE — Telephone Encounter (Signed)
RN with [facility] stated patient has persistent foot edema but NO weight gain and stable vital signs.   Pt is currently taking 80mg  of laxis bid Please advise.      Callback # [phone]

## 2015-02-18 ENCOUNTER — Ambulatory Visit (INDEPENDENT_AMBULATORY_CARE_PROVIDER_SITE_OTHER): Payer: Medicare Other | Admitting: Cardiology

## 2015-02-18 DIAGNOSIS — I214 Non-ST elevation (NSTEMI) myocardial infarction: Secondary | ICD-10-CM

## 2015-02-18 DIAGNOSIS — I513 Intracardiac thrombosis, not elsewhere classified: Secondary | ICD-10-CM

## 2015-02-18 DIAGNOSIS — I2109 ST elevation (STEMI) myocardial infarction involving other coronary artery of anterior wall: Secondary | ICD-10-CM

## 2015-02-18 DIAGNOSIS — Z5181 Encounter for therapeutic drug level monitoring: Secondary | ICD-10-CM

## 2015-02-18 LAB — POCT INR: INR: 2.3

## 2015-02-24 ENCOUNTER — Emergency Department (HOSPITAL_COMMUNITY): Payer: Medicare Other

## 2015-02-24 ENCOUNTER — Telehealth (HOSPITAL_COMMUNITY): Payer: Self-pay | Admitting: Cardiology

## 2015-02-24 ENCOUNTER — Emergency Department (HOSPITAL_COMMUNITY)
Admission: EM | Admit: 2015-02-24 | Discharge: 2015-02-24 | Disposition: A | Discharge status: Home / Self Care | Payer: Medicare Other | Attending: Emergency Medicine | Admitting: Emergency Medicine

## 2015-02-24 ENCOUNTER — Other Ambulatory Visit: Payer: Self-pay | Admitting: *Deleted

## 2015-02-24 ENCOUNTER — Encounter (HOSPITAL_COMMUNITY): Payer: Self-pay | Admitting: Family Medicine

## 2015-02-24 DIAGNOSIS — R197 Diarrhea, unspecified: Secondary | ICD-10-CM | POA: Diagnosis not present

## 2015-02-24 DIAGNOSIS — I2510 Atherosclerotic heart disease of native coronary artery without angina pectoris: Secondary | ICD-10-CM | POA: Diagnosis not present

## 2015-02-24 DIAGNOSIS — M1990 Unspecified osteoarthritis, unspecified site: Secondary | ICD-10-CM | POA: Diagnosis not present

## 2015-02-24 DIAGNOSIS — E785 Hyperlipidemia, unspecified: Secondary | ICD-10-CM | POA: Insufficient documentation

## 2015-02-24 DIAGNOSIS — Z7951 Long term (current) use of inhaled steroids: Secondary | ICD-10-CM | POA: Insufficient documentation

## 2015-02-24 DIAGNOSIS — N184 Chronic kidney disease, stage 4 (severe): Secondary | ICD-10-CM | POA: Insufficient documentation

## 2015-02-24 DIAGNOSIS — Z86010 Personal history of colonic polyps: Secondary | ICD-10-CM | POA: Diagnosis not present

## 2015-02-24 DIAGNOSIS — K644 Residual hemorrhoidal skin tags: Secondary | ICD-10-CM | POA: Diagnosis not present

## 2015-02-24 DIAGNOSIS — R1084 Generalized abdominal pain: Secondary | ICD-10-CM | POA: Insufficient documentation

## 2015-02-24 DIAGNOSIS — Z91040 Latex allergy status: Secondary | ICD-10-CM | POA: Diagnosis not present

## 2015-02-24 DIAGNOSIS — R109 Unspecified abdominal pain: Secondary | ICD-10-CM

## 2015-02-24 DIAGNOSIS — G4733 Obstructive sleep apnea (adult) (pediatric): Secondary | ICD-10-CM | POA: Insufficient documentation

## 2015-02-24 DIAGNOSIS — Z9981 Dependence on supplemental oxygen: Secondary | ICD-10-CM | POA: Insufficient documentation

## 2015-02-24 DIAGNOSIS — J441 Chronic obstructive pulmonary disease with (acute) exacerbation: Secondary | ICD-10-CM | POA: Insufficient documentation

## 2015-02-24 DIAGNOSIS — E119 Type 2 diabetes mellitus without complications: Secondary | ICD-10-CM | POA: Diagnosis not present

## 2015-02-24 DIAGNOSIS — Z8701 Personal history of pneumonia (recurrent): Secondary | ICD-10-CM | POA: Diagnosis not present

## 2015-02-24 DIAGNOSIS — Z7901 Long term (current) use of anticoagulants: Secondary | ICD-10-CM | POA: Insufficient documentation

## 2015-02-24 DIAGNOSIS — Z794 Long term (current) use of insulin: Secondary | ICD-10-CM | POA: Insufficient documentation

## 2015-02-24 DIAGNOSIS — I129 Hypertensive chronic kidney disease with stage 1 through stage 4 chronic kidney disease, or unspecified chronic kidney disease: Secondary | ICD-10-CM | POA: Diagnosis not present

## 2015-02-24 DIAGNOSIS — K219 Gastro-esophageal reflux disease without esophagitis: Secondary | ICD-10-CM | POA: Diagnosis not present

## 2015-02-24 DIAGNOSIS — I5032 Chronic diastolic (congestive) heart failure: Secondary | ICD-10-CM | POA: Diagnosis not present

## 2015-02-24 DIAGNOSIS — R531 Weakness: Secondary | ICD-10-CM | POA: Diagnosis present

## 2015-02-24 DIAGNOSIS — Z79899 Other long term (current) drug therapy: Secondary | ICD-10-CM | POA: Diagnosis not present

## 2015-02-24 LAB — CBC
HEMATOCRIT: 39.9 % (ref 36.0–46.0)
Hemoglobin: 13.2 g/dL (ref 12.0–15.0)
MCH: 29.3 pg (ref 26.0–34.0)
MCHC: 33.1 g/dL (ref 30.0–36.0)
MCV: 88.7 fL (ref 78.0–100.0)
PLATELETS: 581 10*3/uL — AB (ref 150–400)
RBC: 4.5 MIL/uL (ref 3.87–5.11)
RDW: 16.5 % — ABNORMAL HIGH (ref 11.5–15.5)
WBC: 9 10*3/uL (ref 4.0–10.5)

## 2015-02-24 LAB — HEPATIC FUNCTION PANEL
ALK PHOS: 138 U/L — AB (ref 38–126)
ALT: 17 U/L (ref 14–54)
AST: 33 U/L (ref 15–41)
Albumin: 2.5 g/dL — ABNORMAL LOW (ref 3.5–5.0)
BILIRUBIN TOTAL: 0.6 mg/dL (ref 0.3–1.2)
Bilirubin, Direct: 0.2 mg/dL (ref 0.1–0.5)
Indirect Bilirubin: 0.4 mg/dL (ref 0.3–0.9)
Total Protein: 6 g/dL — ABNORMAL LOW (ref 6.5–8.1)

## 2015-02-24 LAB — I-STAT CG4 LACTIC ACID, ED
Lactic Acid, Venous: 1.14 mmol/L (ref 0.5–2.0)
Lactic Acid, Venous: 2.02 mmol/L (ref 0.5–2.0)

## 2015-02-24 LAB — I-STAT TROPONIN, ED
Troponin i, poc: 0.03 ng/mL (ref 0.00–0.08)
Troponin i, poc: 0.05 ng/mL (ref 0.00–0.08)

## 2015-02-24 LAB — BASIC METABOLIC PANEL
ANION GAP: 14 (ref 5–15)
BUN: 71 mg/dL — ABNORMAL HIGH (ref 6–20)
CALCIUM: 10 mg/dL (ref 8.9–10.3)
CHLORIDE: 106 mmol/L (ref 101–111)
CO2: 18 mmol/L — ABNORMAL LOW (ref 22–32)
Creatinine, Ser: 4.09 mg/dL — ABNORMAL HIGH (ref 0.44–1.00)
GFR calc Af Amer: 12 mL/min — ABNORMAL LOW (ref >60)
GFR calc non Af Amer: 10 mL/min — ABNORMAL LOW (ref >60)
Glucose, Bld: 78 mg/dL (ref 65–99)
Potassium: 4.4 mmol/L (ref 3.5–5.1)
SODIUM: 138 mmol/L (ref 135–145)

## 2015-02-24 LAB — BRAIN NATRIURETIC PEPTIDE: B Natriuretic Peptide: 475.5 pg/mL — ABNORMAL HIGH (ref 0.0–100.0)

## 2015-02-24 LAB — POC OCCULT BLOOD, ED: Fecal Occult Bld: NEGATIVE

## 2015-02-24 LAB — LIPASE, BLOOD: LIPASE: 68 U/L — AB (ref 22–51)

## 2015-02-24 MED ORDER — SODIUM CHLORIDE 0.9 % IV BOLUS (SEPSIS)
500.0000 mL | Freq: Once | INTRAVENOUS | Status: AC
  Administered 2015-02-24: 500 mL via INTRAVENOUS

## 2015-02-24 MED ORDER — WARFARIN SODIUM 1 MG PO TABS: 1.0000 mg | ORAL_TABLET | Freq: Every evening | ORAL | Status: DC

## 2015-02-24 NOTE — ED Notes (Signed)
Pt here for weakness, diarrhea and SOB. Pt labored breathing. sts her heart doctor told her she had a blockage.

## 2015-02-24 NOTE — ED Notes (Signed)
Patient crying, states she does not like being stuck so many times. IV established without difficulty. Patient denies any sob or chest pain. Patient is aware of plan of care.

## 2015-02-24 NOTE — Discharge Instructions (Signed)

## 2015-02-24 NOTE — ED Notes (Signed)
Patient reports bleeding painful hemorrhoids since Saturday. Patient also reports increased sob and weakness since Saturday. Patient does take coumadin daily, last dose last night. Patient wears intermittent O2 as needed at home. Pt restricted to left arm but does receive dialysis anymore.

## 2015-02-24 NOTE — Telephone Encounter (Signed)
TRIAGE CALL [name],PT with [facility] called during pts [place] visit Pt reports she just doesn't feel well Reports rectal bleeding diarrhea x 3 days BRB found in stool and in toilet (on coumadin) Weight to 173 Increased SOB, R 22 99% RA  Per VO Amy Clegg,NP Pt should report to ER for further evaluation

## 2015-02-24 NOTE — Telephone Encounter (Signed)
TRIAGE CALL

## 2015-02-24 NOTE — ED Notes (Signed)
Patient able to speak in complete sentences, no sob without oxygen. Patient states she feels a lot better then on arrival. Patient with no complaints at this time.

## 2015-02-24 NOTE — ED Provider Notes (Signed)
CSN: [id number]     Arrival date & time 02/24/15  1552 History   First MD Initiated Contact with Patient 02/24/15 1608     Chief Complaint  Patient presents with  . Weakness  . Shortness of Breath  . Diarrhea     (Consider location/radiation/quality/duration/timing/severity/associated sxs/prior Treatment) HPI  Patient is a 69 year old female with a history of CAD, COPD, gout, CKD stage IV presenting today for abdominal pain, chest tightness, and bleeding hemorrhoids.  Pt reports she has chronic abdominal pain and this is similar to previous.  Admits to mild nausea and an ongoing intermittent watery diarrhea over the last several months.  For the past three days she has had increasing bleeding from her hemorrhoids and reports this is the main reason she came to the ED.  Today she also reports an episode of chest tightness that was self limited and required no nitro.  Reports taking ASA today.  Pt attempting to use home hemmorrhoid cream at home with mild relief.    Past Medical History  Diagnosis Date  . Essential hypertension, benign   . Coronary atherosclerosis of native coronary artery 01/04/2006    Tiny OM1 70-90% ostial stenosis, no other CAD  . COPD (chronic obstructive pulmonary disease)   . Esophageal reflux   . Dyslipidemia   . Gout   . Colon polyps 2011    Tubular adenomatous polyps  . Spinal stenosis of lumbar region     chronic low back pain.   . Bulging lumbar disc   . Chronic diastolic heart failure   . PNA (pneumonia) 2012    With pleural effusion, requiring thoracentesis  . Chronic bronchitis   . On home oxygen therapy   . OSA (obstructive sleep apnea)     Marginally compliant with CPAP  . Type II diabetes mellitus   . Arthritis   . Diverticulosis   . GERD (gastroesophageal reflux disease)   . Anxiety   . Hyperlipidemia   . Sarcoidosis of lung   . CKD (chronic kidney disease) stage 4, GFR 15-29 ml/min   . IBS (irritable bowel syndrome)   . Cataracts, both  eyes   . Headache(784.0)   . Lactic acidosis 01/21/2015  . Chronic CHF (congestive heart failure)    Past Surgical History  Procedure Laterality Date  . Vesicovaginal fistula closure w/  total abdominal hysterectomy  1992  . Bilateral total knee replacements Bilateral Rt=5/04 & Lft=1/09    by DrAlusio  . 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 Zella Richer  . Appendectomy  1957  . Tonsillectomy  1968  . Cholecystectomy  1980's  . Abdominal hysterectomy  1992  . Reduction mammaplasty Bilateral 1980's  . Cardiac catheterization  1990's  . Thoracentesis  2012  . Joint replacement    . Av fistula placement Left 12/31/2013    Procedure: ARTERIOVENOUS (AV) FISTULA CREATION;  Surgeon: Elam Dutch, MD;  Location: [facility];  Service: Vascular;  Laterality: Left;  . Colonoscopy w/ biopsies and polypectomy    . Av fistula placement Left 03/18/2014    Procedure: ARTERIOVENOUS (AV) FISTULA CREATION- LEFT BRACHIOCEPHALIC ;  Surgeon: Elam Dutch, MD;  Location: [facility] OR;  Service: Vascular;  Laterality: Left;  . Ligation of arteriovenous  fistula Left 03/18/2014    Procedure: LIGATION OF ARTERIOVENOUS  FISTULA- LEFT RADIOCEPHALIC;  Surgeon: Elam Dutch, MD;  Location: [place];  Service: Vascular;  Laterality: Left;  . Cardiac catheterization N/A 01/27/2015    Procedure: Right Heart  Cath;  Surgeon: Larey Dresser, MD;  Location: [facility];  Service: Cardiovascular;  Laterality: N/A;   Family History  Problem Relation Age of Onset  . Diabetes Mother   . Heart disease Mother   . Hyperlipidemia Mother   . Varicose Veins Mother   . Breast cancer Maternal Aunt   . Heart disease Father   . Deep vein thrombosis Father   . Hyperlipidemia Father   . Heart disease Sister     before age 55  . Cancer Sister   . Diabetes Sister   . Hyperlipidemia Sister   . Heart attack Sister   . Heart disease Brother   . Diabetes Brother   . Hyperlipidemia Brother   . Heart attack Brother   .  Hyperlipidemia Sister    History  Substance Use Topics  . Smoking status: Never Smoker   . Smokeless tobacco: Never Used  . Alcohol Use: No   OB History    No data available     Review of Systems  Constitutional: Negative for fever and chills.  HENT: Negative for congestion and sore throat.   Eyes: Negative for pain.  Respiratory: Negative for cough and shortness of breath.   Cardiovascular: Positive for chest pain. Negative for palpitations and leg swelling.  Gastrointestinal: Positive for nausea, abdominal pain, diarrhea, blood in stool, anal bleeding and rectal pain. Negative for vomiting, constipation and abdominal distention.       Bleeding hemorrhoid  Genitourinary: Negative for dysuria and flank pain.  Musculoskeletal: Negative for back pain and neck pain.  Skin: Negative for rash.  Allergic/Immunologic: Negative.   Neurological: Negative for dizziness and light-headedness.  Psychiatric/Behavioral: Negative for confusion.      Allergies  Adhesive; Avelox; Codeine; Guaifenesin; Latex; and Oxycodone  Home Medications   Prior to Admission medications   Medication Sig Start Date End Date Taking? Authorizing Provider  acetaminophen (TYLENOL) 500 MG tablet Take 1,000 mg by mouth 2 (two) times daily as needed (pain).    Yes Historical Provider, MD  albuterol (PROVENTIL HFA;VENTOLIN HFA) 108 (90 BASE) MCG/ACT inhaler Inhale 2 puffs into the lungs every 4 (four) hours as needed for wheezing or shortness of breath.    Yes Historical Provider, MD  albuterol (PROVENTIL) (2.5 MG/3ML) 0.083% nebulizer solution Take 2.5 mg by nebulization every 2 (two) hours as needed for wheezing or shortness of breath.   Yes Historical Provider, MD  Alpha-D-Galactosidase (BEANO PO) Take 1 tablet by mouth daily.   Yes Historical Provider, MD  Apremilast (OTEZLA) 30 MG TABS Take 30 mg by mouth daily.    Yes Historical Provider, MD  atorvastatin (LIPITOR) 20 MG tablet Take 20 mg by mouth at bedtime.    Yes Historical Provider, MD  carvedilol (COREG) 12.5 MG tablet Take 1 tablet (12.5 mg total) by mouth 2 (two) times daily with a meal. 02/02/15  Yes Ripudeep K Rai, MD  esomeprazole (NEXIUM) 40 MG capsule Take 1 capsule (40 mg total) by mouth 2 (two) times daily. 01/01/15  Yes Irene Shipper, MD  fluticasone [facility]) 50 MCG/ACT nasal spray Place 2 sprays into both nostrils daily as needed for allergies or rhinitis.    Yes Historical Provider, MD  Fluticasone-Salmeterol (ADVAIR) 250-50 MCG/DOSE AEPB Inhale 1 puff into the lungs 2 (two) times daily. 09/25/14  Yes Noralee Space, MD  furosemide (LASIX) 80 MG tablet Take 2 tablets (160 mg total) by mouth 2 (two) times daily. 02/02/15  Yes Ripudeep Krystal Eaton, MD  insulin lispro (HUMALOG KWIKPEN) 100 UNIT/ML KiwkPen Inject 0.01-0.1 mLs (1-10 Units total) into the skin 3 (three) times daily. Sliding scale CBG 70 - 120: 0 units CBG 121 - 150: 1 unit,  CBG 151 - 200: 2 units,  CBG 201 - 250: 3 units,  CBG 251 - 300: 5 units,  CBG 301 - 350: 7 units,  CBG 351 - 400: 9 units   CBG > 400: 10 units and notify your MD 02/02/15  Yes Ripudeep K Rai, MD  isosorbide-hydrALAZINE (BIDIL) 20-37.5 MG per tablet Take 2 tablets by mouth 3 (three) times daily. 02/02/15  Yes Ripudeep Krystal Eaton, MD  loperamide (IMODIUM) 2 MG capsule Take 1 capsule (2 mg total) by mouth 2 (two) times daily as needed for diarrhea or loose stools. 02/02/15  Yes Ripudeep Krystal Eaton, MD  metolazone (ZAROXOLYN) 5 MG tablet Take 5 mg by mouth as needed. FOR EDEMA 12/12/14  Yes Historical Provider, MD  Multiple Vitamins-Minerals (MULTIVITAMIN WITH MINERALS) tablet Take 1 tablet by mouth daily.   Yes Historical Provider, MD  ondansetron (ZOFRAN) 4 MG tablet Take 1 tablet (4 mg total) by mouth every 8 (eight) hours as needed for nausea or vomiting. 12/31/14  Yes Robynn Pane, MD  promethazine (PHENERGAN) 25 MG tablet Take 1 tablet (25 mg total) by mouth every 4 (four) hours as needed for nausea or vomiting. 02/02/15  Yes Ripudeep Krystal Eaton, MD   saccharomyces boulardii (FLORASTOR) 250 MG capsule Take 1 capsule (250 mg total) by mouth 2 (two) times daily. 02/02/15  Yes Ripudeep Krystal Eaton, MD  TOUJEO SOLOSTAR 300 UNIT/ML SOPN Inject 40 Units into the skin every morning. 11/21/14  Yes Historical Provider, MD  triamcinolone cream (KENALOG) 0.1 % Apply 1 application topically 2 (two) times daily.   Yes Historical Provider, MD  warfarin (COUMADIN) 1 MG tablet Take 1 tablet (1 mg total) by mouth every evening. Dose to be adjusted Patient taking differently: Take 1.5 mg by mouth every evening. Dose to be adjusted 02/24/15  Yes Sueanne Margarita, MD  famotidine (PEPCID) 20 MG tablet Take 1 tablet (20 mg total) by mouth daily. 02/02/15   Ripudeep Krystal Eaton, MD  ferrous sulfate 325 (65 FE) MG tablet Take 325 mg by mouth daily with breakfast.    Historical Provider, MD  sodium bicarbonate 650 MG tablet Take 1 tablet (650 mg total) by mouth 2 (two) times daily. 02/02/15   Ripudeep K Rai, MD   BP 160/69 mmHg  Pulse 84  Temp(Src) 97.4 F (36.3 C)  Resp 22  SpO2 100% Physical Exam  Constitutional: She is oriented to person, place, and time. She appears well-developed and well-nourished. No distress.  HENT:  Head: Normocephalic and atraumatic.  Eyes: Conjunctivae and EOM are normal. Pupils are equal, round, and reactive to light.  Neck: Normal range of motion. Neck supple.  Cardiovascular: Normal rate, regular rhythm and normal heart sounds.   Pulses:      Radial pulses are 2+ on the right side, and 2+ on the left side.  Pulmonary/Chest: Effort normal and breath sounds normal. No accessory muscle usage. No respiratory distress. She has no decreased breath sounds.  Abdominal: Soft. Bowel sounds are normal. There is generalized tenderness. There is no rigidity, no rebound, no guarding, no CVA tenderness, no tenderness at McBurney's point and negative Murphy's sign.  Genitourinary: Rectal exam shows external hemorrhoid (non-thrombosed and not bleeding.) and  tenderness. Rectal exam shows no internal hemorrhoid, no fissure, no mass and anal tone  normal. Guaiac positive stool.  Musculoskeletal: Normal range of motion.  Neurological: She is alert and oriented to person, place, and time. She has normal reflexes. No cranial nerve deficit.  Skin: Skin is warm and dry. She is not diaphoretic.  Psychiatric: She has a normal mood and affect.    ED Course  Procedures (including critical care time) Labs Review Labs Reviewed  BASIC METABOLIC PANEL - Abnormal; Notable for the following:    CO2 18 (*)    BUN 71 (*)    Creatinine, Ser 4.09 (*)    GFR calc non Af Amer 10 (*)    GFR calc Af Amer 12 (*)    All other components within normal limits  CBC - Abnormal; Notable for the following:    RDW 16.5 (*)    Platelets 581 (*)    All other components within normal limits  BRAIN NATRIURETIC PEPTIDE - Abnormal; Notable for the following:    B Natriuretic Peptide 475.5 (*)    All other components within normal limits  LIPASE, BLOOD - Abnormal; Notable for the following:    Lipase 68 (*)    All other components within normal limits  HEPATIC FUNCTION PANEL - Abnormal; Notable for the following:    Total Protein 6.0 (*)    Albumin 2.5 (*)    Alkaline Phosphatase 138 (*)    All other components within normal limits  I-STAT CG4 LACTIC ACID, ED - Abnormal; Notable for the following:    Lactic Acid, Venous 2.02 (*)    All other components within normal limits  POC OCCULT BLOOD, ED  I-STAT TROPOININ, ED  I-STAT CG4 LACTIC ACID, ED  Randolm Idol, ED    Imaging Review Dg Chest 2 View  02/24/2015   CLINICAL DATA:  Short of breath and weakness  EXAM: CHEST  2 VIEW  COMPARISON:  CT 02/14/2015  FINDINGS: Normal mediastinum and cardiac silhouette. Chronic central bronchitic markings. Normal pulmonary vasculature. No effusion, infiltrate, or pneumothorax.  IMPRESSION: Mild bronchitic markings.  No acute findings.   Electronically Signed   By: Suzy Bouchard  M.D.   On: 02/24/2015 17:02     EKG Interpretation   Date/Time:  Monday February 24 2015 15:58:12 EDT Ventricular Rate:  84 PR Interval:  122 QRS Duration: 118 QT Interval:  440 QTC Calculation: 519 R Axis:   135 Text Interpretation:  Sinus rhythm with occasional Premature ventricular  complexes Incomplete right bundle branch block Right ventricular  hypertrophy Prolonged QT Abnormal ECG ED PHYSICIAN INTERPRETATION  AVAILABLE IN [facility] Confirmed by TEST, Record ([id number]) on  02/25/2015 7:38:56 AM     MDM   Final diagnoses:  Abdominal pain, unspecified abdominal location   On initial evaluation pt HDS in NAD.  Abdomen only mildly tender with no peritoneal signs. Pain chronic with no acute exacerbations.  Do no feel imaging warranted at this time. Lipase mildly elevated and alk phos slightly elevated.  No acute pain and doubt cholelithiasis or cholecystitis however.  LFTs otherwise WNL.  No leukocytosis. Initial lactic acid wnl and pt afebrile.  Non-gap acidosis on labs and pt takes bicarb at home.  Cr and BUN elevated.  Pt to f/u with her nephrologist tomorrow morning and advised to keep appointment and address these issues there.   Hemorrhoids evaluated and found to be non-bleeding and non-thrombosed.  Hgb WNL.  Advised pt to continue home therapy.    Pt CP free on arrival.  EKG showing no new ischemic changes.  Trop  and delta trop negative.  Pt higher risk but she did have recent negative cath on 01/27/15.  Doubt ACS.  Pt Wells low risk and doubt PE.  CXR with no acute cardiopulmonary process and doubt PNA, PTX, or dissection.  Given return precautions for continued CP.   Advised pt to f/u with her nephrologist in the morning and f/u with PCP in 2-3 days for hemorrhoids.   If performed, labs, EKGs, and imaging were reviewed/interpreted by myself and my attending and incorporated into medical decision making.  Discussed pertinent finding with patient or caregiver prior to discharge  with no further questions.  Immediate return precautions given and pt or caregiver reports understanding.  Pt care supervised by my attending Dr. Tawnya Crook.   Geronimo Boot, MD PGY-2  Emergency Medicine    Geronimo Boot, MD 02/25/15 [place], MD 02/28/15 [phone]

## 2015-02-25 ENCOUNTER — Ambulatory Visit (HOSPITAL_COMMUNITY)
Admission: RE | Admit: 2015-02-25 | Discharge: 2015-02-25 | Disposition: A | Discharge status: Home / Self Care | Payer: Medicare Other | Source: Ambulatory Visit | Attending: Cardiology | Admitting: Cardiology

## 2015-02-25 VITALS — BP 116/68 | HR 89 | Wt 175.0 lb

## 2015-02-25 DIAGNOSIS — K219 Gastro-esophageal reflux disease without esophagitis: Secondary | ICD-10-CM | POA: Diagnosis not present

## 2015-02-25 DIAGNOSIS — G4733 Obstructive sleep apnea (adult) (pediatric): Secondary | ICD-10-CM | POA: Diagnosis not present

## 2015-02-25 DIAGNOSIS — I129 Hypertensive chronic kidney disease with stage 1 through stage 4 chronic kidney disease, or unspecified chronic kidney disease: Secondary | ICD-10-CM | POA: Insufficient documentation

## 2015-02-25 DIAGNOSIS — D869 Sarcoidosis, unspecified: Secondary | ICD-10-CM | POA: Diagnosis not present

## 2015-02-25 DIAGNOSIS — Z7901 Long term (current) use of anticoagulants: Secondary | ICD-10-CM | POA: Diagnosis not present

## 2015-02-25 DIAGNOSIS — E785 Hyperlipidemia, unspecified: Secondary | ICD-10-CM | POA: Diagnosis not present

## 2015-02-25 DIAGNOSIS — Z794 Long term (current) use of insulin: Secondary | ICD-10-CM | POA: Insufficient documentation

## 2015-02-25 DIAGNOSIS — Z9981 Dependence on supplemental oxygen: Secondary | ICD-10-CM | POA: Diagnosis not present

## 2015-02-25 DIAGNOSIS — Z7982 Long term (current) use of aspirin: Secondary | ICD-10-CM | POA: Insufficient documentation

## 2015-02-25 DIAGNOSIS — I5022 Chronic systolic (congestive) heart failure: Secondary | ICD-10-CM | POA: Diagnosis not present

## 2015-02-25 DIAGNOSIS — E669 Obesity, unspecified: Secondary | ICD-10-CM | POA: Diagnosis not present

## 2015-02-25 DIAGNOSIS — N179 Acute kidney failure, unspecified: Secondary | ICD-10-CM | POA: Diagnosis not present

## 2015-02-25 DIAGNOSIS — K625 Hemorrhage of anus and rectum: Secondary | ICD-10-CM | POA: Diagnosis not present

## 2015-02-25 DIAGNOSIS — I2510 Atherosclerotic heart disease of native coronary artery without angina pectoris: Secondary | ICD-10-CM | POA: Diagnosis not present

## 2015-02-25 DIAGNOSIS — N184 Chronic kidney disease, stage 4 (severe): Secondary | ICD-10-CM | POA: Diagnosis not present

## 2015-02-25 DIAGNOSIS — Z79899 Other long term (current) drug therapy: Secondary | ICD-10-CM | POA: Insufficient documentation

## 2015-02-25 DIAGNOSIS — I272 Other secondary pulmonary hypertension: Secondary | ICD-10-CM | POA: Diagnosis not present

## 2015-02-25 DIAGNOSIS — D472 Monoclonal gammopathy: Secondary | ICD-10-CM | POA: Diagnosis not present

## 2015-02-25 DIAGNOSIS — Z8249 Family history of ischemic heart disease and other diseases of the circulatory system: Secondary | ICD-10-CM | POA: Insufficient documentation

## 2015-02-25 DIAGNOSIS — L409 Psoriasis, unspecified: Secondary | ICD-10-CM | POA: Diagnosis not present

## 2015-02-25 NOTE — Progress Notes (Signed)
Patient ID: [name], female   DOB: 07/02/1946, 69 y.o.   MRN: [id number] PCP: Dr Chapman Fitch Pulmonology: Dr Lenna Gilford  69 yo with history of OHS/OSA, sarcoidosis, CAD, suspected ischemic cardiomyopathy, LV apical thrombus, and CKD stage IV presents for cardiology followup.  She was admitted in 6/16 with abdominal pain, nausea, vomiting, elevated lactate, and AKI on CKD.  Echo showed EF decreased to 30-35% with LV apical thrombus.  She was started on anticoagulation.  RHC was done while she was in the hospital, showing moderate pulmonary hypertension and RV>LV failure.  Lexiscan cardiolite showed no ischemia and no definite infarction.  She was thought to have infectious colitis versus possible mesenteric cardioembolism.  She was diuresed carefully given rise in creatinine.    She is now home, living with her niece.  She is weak and fatigued generally.  No further abdominal pain and weight has been stable at home. However she was seen in ED recently with BRBPR. Thought to be hemorrhoid. Lacte was mildly elevated. Also noted to have progressive renal failure  With Cr > 4. She is walking some in her house with a walker.  She can walk about 50 feet before getting short of breath and tired.  No chest pain.  No lightheadedness.  Using CPAP at night and 2L oxygen during the day.  No orthopnea.  SBP 120s-140 at home.   Labs (7/16): K 4.6, creatinine 3.55  PMH: 1. OHS/OSA: On CPAP at night and oxygen during the day.  2. Psoriasis 3. Obesity 4. CKD stage IV 5. HTN 6. H/o sarcoidosis: Hypercalcemia, splenic involvement, s/p splenectomy.  No definite lung involvement on 9/15 chest CT.  7. Chronic systolic CHF: Suspect ischemic cardiomyopathy.  Echo (6/16) with EF 30-35%, apical thrombus, PA systolic pressure 62 mmHg.   8. LV apical thrombus 9. CAD: LHC (6/07) with 70-90% stenosis small OM.  See #7 above, suspect ischemia cardiomyopathy but no repeat cath given CKD.  Lexiscan Cardiolite (6/16) with EF 47%,  inferolateral fixed defect may be attenuation, no ischemia.   10. GERD 11. Gout 12. Hyperlipidemia 13. 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 PAH: RHC (6/16) with mean RA 13, PA 66/26 mean 39, mean PCWP 16, CI 2.43, PVR 4.9 WU.  ?OHS/OSA with pulmonary venous hypertension.  Cannot rule out role for sarcoidosis.   SH: Lives with niece, nonsmoker, no ETOH.   FH: CAD  ROS: All systems reviewed and negative except as per HPI.   Current Outpatient Prescriptions  Medication Sig Dispense Refill  . acetaminophen (TYLENOL) 500 MG tablet Take 1,000 mg by mouth 2 (two) times daily as needed (pain).     Marland Kitchen albuterol (PROVENTIL HFA;VENTOLIN HFA) 108 (90 BASE) MCG/ACT inhaler Inhale 2 puffs into the lungs every 4 (four) hours as needed for wheezing or shortness of breath.     Marland Kitchen albuterol (PROVENTIL) (2.5 MG/3ML) 0.083% nebulizer solution Take 2.5 mg by nebulization every 2 (two) hours as needed for wheezing or shortness of breath.    . Alpha-D-Galactosidase (BEANO PO) Take 1 tablet by mouth daily.    Marland Kitchen Apremilast (OTEZLA) 30 MG TABS Take 30 mg by mouth daily.     Marland Kitchen aspirin EC 81 MG tablet Take 81 mg by mouth daily.    Marland Kitchen atorvastatin (LIPITOR) 20 MG tablet Take 20 mg by mouth at bedtime.    . carvedilol (COREG) 12.5 MG tablet Take 1 tablet (12.5 mg total) by mouth 2 (two) times daily with a meal. 60 tablet 3  . esomeprazole (  NEXIUM) 40 MG capsule Take 1 capsule (40 mg total) by mouth 2 (two) times daily. 60 capsule 3  . famotidine (PEPCID) 20 MG tablet Take 1 tablet (20 mg total) by mouth daily. 30 tablet 3  . ferrous sulfate 325 (65 FE) MG tablet Take 325 mg by mouth daily with breakfast.    . fluticasone (FLONASE) 50 MCG/ACT nasal spray Place 2 sprays into both nostrils daily as needed for allergies or rhinitis.     . Fluticasone-Salmeterol (ADVAIR) 250-50 MCG/DOSE AEPB Inhale 1 puff into the lungs 2 (two) times daily. 180 each 3  . furosemide (LASIX) 80 MG tablet Take 2 tablets (160 mg total) by mouth 2 (two) times  daily. 120 tablet 2  . hydrALAZINE (APRESOLINE) 25 MG tablet Take 25 mg by mouth 3 (three) times daily.  0  . insulin lispro (HUMALOG KWIKPEN) 100 UNIT/ML KiwkPen Inject 0.01-0.1 mLs (1-10 Units total) into the skin 3 (three) times daily. Sliding scale CBG 70 - 120: 0 units CBG 121 - 150: 1 unit,  CBG 151 - 200: 2 units,  CBG 201 - 250: 3 units,  CBG 251 - 300: 5 units,  CBG 301 - 350: 7 units,  CBG 351 - 400: 9 units   CBG > 400: 10 units and notify your MD 15 mL 3  . isosorbide mononitrate (IMDUR) 30 MG 24 hr tablet Take 15 mg by mouth daily.  0  . isosorbide-hydrALAZINE (BIDIL) 20-37.5 MG per tablet Take 2 tablets by mouth 3 (three) times daily. 90 tablet 3  . loperamide (IMODIUM) 2 MG capsule Take 1 capsule (2 mg total) by mouth 2 (two) times daily as needed for diarrhea or loose stools. 60 capsule 0  . metolazone (ZAROXOLYN) 5 MG tablet Take 5 mg by mouth as needed. FOR EDEMA  1  . Multiple Vitamins-Minerals (MULTIVITAMIN WITH MINERALS) tablet Take 1 tablet by mouth daily.    . ondansetron (ZOFRAN) 4 MG tablet Take 1 tablet (4 mg total) by mouth every 8 (eight) hours as needed for nausea or vomiting. 15 tablet 0  . promethazine (PHENERGAN) 25 MG tablet Take 1 tablet (25 mg total) by mouth every 4 (four) hours as needed for nausea or vomiting. 30 tablet 0  . saccharomyces boulardii (FLORASTOR) 250 MG capsule Take 1 capsule (250 mg total) by mouth 2 (two) times daily. 60 capsule 3  . sodium bicarbonate 650 MG tablet Take 1 tablet (650 mg total) by mouth 2 (two) times daily. 60 tablet 3  . TOUJEO SOLOSTAR 300 UNIT/ML SOPN Inject 40 Units into the skin every morning.  3  . triamcinolone cream (KENALOG) 0.1 % Apply 1 application topically 2 (two) times daily.    Marland Kitchen warfarin (COUMADIN) 1 MG tablet Take 1 tablet (1 mg total) by mouth every evening. Dose to be adjusted (Patient taking differently: Take 1.5 mg by mouth every evening. Dose to be adjusted) 45 tablet 1   No current facility-administered  medications for this encounter.   BP 116/68 mmHg  Pulse 89  Wt 175 lb (79.379 kg)  SpO2 99% General: NAD, obese Neck: Thick, JVP 8-9 cm, no thyromegaly or thyroid nodule.  Lungs: Crackles bilateral bases.  CV: Nondisplaced PMI.  Heart regular S1/S2, no S3/S4, no murmur.  Trace ankle edema.  No carotid bruit.  Normal pedal pulses.  Abdomen: Soft, nontender, no hepatosplenomegaly, no distention.  Skin: Intact without lesions or rashes.  Neurologic: Alert and oriented x 3.  Psych: Normal affect. Extremities: No clubbing or  cyanosis.  HEENT: Normal.   Assessment/Plan: 1. Chronic systolic CHF: EF 97-58% on echo, suspected  No ischemia on Cardiolite.   - Volume low. Hold lasix today. Will see Dr. Joelyn Oms in Renal tomorrow. Suspect we should cut lasix back to 120 bid.  - Continue Coreg 12.5 mg bid and Bidil 2 tabs tid.  She was also taking hydralazine 25 tid and imudr 15 mg daily.Will stop those. May need to increase Bidil.  - No ACEI or spironolactone with CKD IV.  - Repeat echo in 10/16  2. CAD: Suspected ischemia cardiomyopathy.  No active ischemia on Cardiolite.  Would not cath with severe CKD.  - Continue ASA 81 daily + atorvastatin.  3. A/CKD: Stage IV, has fistula x 2 that are nonfunctioning.  Reviewed lab work from yesterday. Worsening renal function. Adjust lasix as above. Followed by Dr Joelyn Oms  4. 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  Suspect PAH component may be due to OHS/OSA, but cannot rule out a role for sarcoidosis.   - Should have V/Q scan to rule out chronic PEs (though already anticoagulated). - Continue CPAP and home oxygen.  - High resolution CT - No evidence of sarcoid and coronary artery calcification noted.  5. Apical thrombus: Continue warfarin.  6. Abdominal pain: Resolved.  ?mesenteric thromboembolism/ ischemic colitis.   7. Rectal bleeding. - Refer to GI. Has been seen by Dr Henrene Pastor. Stop aspirin. On coumadin for now.    Follow up in 4 week.s  CLEGG,[name]  02/25/2015  Patient seen and examined with [name] Grinder, NP. We discussed all aspects of the encounter. I agree with the assessment and plan as stated above.   She looks dry. She is weak and renal function worse. Hold lasix today. Consider dropping to 120 bid. Will see Dr. Joelyn Oms tomorrow and will await his opinion. May need HD soon. BRBPR with elevated lactate concerning for mesenteric ischemia. Will arrange f/u with GI.   [name] Soden,MD 3:24 PM

## 2015-02-25 NOTE — Patient Instructions (Signed)
Will set up next available appointment for you to be followed up by Dr. Scarlette Shorts concerning rectal bleeding. Address: [address], [place], [place] [address]  Phone: [phone]  STOP aspirin, hydralazine, and imdur.  HOLD Lasix today only.  Follow up 4 weeks.  Do the following things EVERYDAY: 1) Weigh yourself in the morning before breakfast. Write it down and keep it in a log. 2) Take your medicines as prescribed 3) Eat low salt foods-Limit salt (sodium) to 2000 mg per day.  4) Stay as active as you can everyday 5) Limit all fluids for the day to less than 2 liters

## 2015-02-26 ENCOUNTER — Telehealth: Payer: Self-pay | Admitting: Physician Assistant

## 2015-02-26 ENCOUNTER — Ambulatory Visit (INDEPENDENT_AMBULATORY_CARE_PROVIDER_SITE_OTHER): Payer: Medicare Other | Admitting: Cardiology

## 2015-02-26 DIAGNOSIS — I214 Non-ST elevation (NSTEMI) myocardial infarction: Secondary | ICD-10-CM

## 2015-02-26 DIAGNOSIS — Z5181 Encounter for therapeutic drug level monitoring: Secondary | ICD-10-CM

## 2015-02-26 DIAGNOSIS — I513 Intracardiac thrombosis, not elsewhere classified: Secondary | ICD-10-CM

## 2015-02-26 DIAGNOSIS — I2109 ST elevation (STEMI) myocardial infarction involving other coronary artery of anterior wall: Secondary | ICD-10-CM

## 2015-02-26 LAB — POCT INR: INR: 2.3

## 2015-02-26 NOTE — Telephone Encounter (Signed)
Pt was having severe abd pain this am. Pt was encouraged by [facility] RN to go to the ER. Pt did not want to go because she went Monday with dark stools and states they didn't do anything. Pts stools were dark but she is taking iron, er tested stool and there was no blood in the stool. Pt states she had a big diarrhea stool this afternoon and she feels some better. Pt will keep her scheduled appt on Friday, pt instructed to call back for further problems. Pt verbalized understanding.

## 2015-02-26 NOTE — Telephone Encounter (Signed)
[name], please assess

## 2015-02-26 NOTE — Telephone Encounter (Signed)
Complex patient. Nothing further to add via telephone

## 2015-02-28 ENCOUNTER — Encounter: Payer: Self-pay | Admitting: Physician Assistant

## 2015-02-28 ENCOUNTER — Ambulatory Visit (INDEPENDENT_AMBULATORY_CARE_PROVIDER_SITE_OTHER): Payer: Medicare Other | Admitting: Physician Assistant

## 2015-02-28 VITALS — BP 98/62 | HR 63 | Ht 62.0 in | Wt 170.0 lb

## 2015-02-28 DIAGNOSIS — R110 Nausea: Secondary | ICD-10-CM | POA: Diagnosis not present

## 2015-02-28 DIAGNOSIS — R197 Diarrhea, unspecified: Secondary | ICD-10-CM | POA: Diagnosis not present

## 2015-02-28 DIAGNOSIS — R109 Unspecified abdominal pain: Secondary | ICD-10-CM | POA: Diagnosis not present

## 2015-02-28 MED ORDER — DICYCLOMINE HCL 10 MG PO CAPS: ORAL_CAPSULE | ORAL | Status: DC

## 2015-02-28 MED ORDER — HYDROCORTISONE 2.5 % RE CREA: 1.0000 "application " | TOPICAL_CREAM | Freq: Four times a day (QID) | RECTAL | Status: DC

## 2015-02-28 MED ORDER — ESOMEPRAZOLE MAGNESIUM 40 MG PO CPDR: DELAYED_RELEASE_CAPSULE | ORAL | Status: DC

## 2015-02-28 MED ORDER — ONDANSETRON HCL 4 MG PO TABS: 4.0000 mg | ORAL_TABLET | Freq: Three times a day (TID) | ORAL | Status: DC | PRN

## 2015-02-28 MED ORDER — HYDROCORTISONE 2.5 % RE CREA: 1.0000 "application " | TOPICAL_CREAM | Freq: Four times a day (QID) | RECTAL | Status: AC

## 2015-02-28 NOTE — Progress Notes (Signed)
Patient ID: [name], female   DOB: [birth year], 69 y.o.   MRN: [id number]   Subjective:    Patient ID: [name], female    DOB: 1945-12-07, 69 y.o.   MRN: [id number]  HPI [name] is a 69 year old African-American female known to Dr. Henrene Pastor who was last seen here in June 2016. She comes back in today for evaluation of fairly constant nausea and intermittent vomiting abdominal cramping and loose stools. Patient has numerous serious medical problems and was hospitalized earlier in July with an acute exacerbation of congestive heart failure. She also has stage IV chronic kidney disease and is approaching need for dialysis as her creatinine has been around 4. She also has diabetes mellitus, a cardiomyopathy with EF of 30-35% and was recently diagnosed with a left ventricular thrombus for which she is on Coumadin over the next couple of months. She has COPD with chronic hypoxia and is on O2 at 2 L and coronary artery disease. Patient says when she was first released from the hospital a couple so couple of weeks ago she felt a bit better and now is feeling nauseated almost all day long. She has had some intermittent vomiting. Her appetite is poor. She has also had onset of urgency and loose stools and his had a few accidents. She says she's having at least 3-4 bowel movements per day of loose to liquid stool which is malodorous and nonbloody. She was seen by nephrology earlier this week and plan is for follow-up labs next week, they are apparently aware that she is feeling sick and very nauseated and they're anticipating that she may need another graft placed and to start dialysis soon. She feels that Zofran is helpful though she ran out recently. Patient also concerned about rectal bleeding. An ER visit 02/24/2015 with complaints of abdominal pain and bleeding which was felt to be hemorrhoidal. He says every time she has a bowel movement she sees bright red blood on the tissue and sometimes in  the commode. Most recent labs hemoglobin was 13.2 hematocrit of 33.9 WBC of 9 creatinine of 4.09 for occult blood was negative with the ER visit take acid was elevated at 2 After her last office visit here she had a gastric emptying scan which was normal CT of the abdomen and pelvis was last done in June 2016 and of course no IV contrast with no acute findings in last colonoscopy 2011 showed moderate diverticulosis, she had 4 polyps removed which were adenomas.  Review of Systems Pertinent positive and negative review of systems were noted in the above HPI section.  All other review of systems was otherwise negative.  Outpatient Encounter Prescriptions as of 02/28/2015  Medication Sig  . acetaminophen (TYLENOL) 500 MG tablet Take 1,000 mg by mouth 2 (two) times daily as needed (pain).   Marland Kitchen albuterol (PROVENTIL HFA;VENTOLIN HFA) 108 (90 BASE) MCG/ACT inhaler Inhale 2 puffs into the lungs every 4 (four) hours as needed for wheezing or shortness of breath.   Marland Kitchen albuterol (PROVENTIL) (2.5 MG/3ML) 0.083% nebulizer solution Take 2.5 mg by nebulization every 2 (two) hours as needed for wheezing or shortness of breath.  . Alpha-D-Galactosidase (BEANO PO) Take 1 tablet by mouth daily.  Marland Kitchen Apremilast (OTEZLA) 30 MG TABS Take 30 mg by mouth daily.   Marland Kitchen atorvastatin (LIPITOR) 20 MG tablet Take 20 mg by mouth at bedtime.  . carvedilol (COREG) 12.5 MG tablet Take 1 tablet (12.5 mg total) by mouth 2 (two) times daily with  a meal.  . esomeprazole (NEXIUM) 40 MG capsule Take 1 capsule once daily in the morning.  . famotidine (PEPCID) 20 MG tablet Take 1 tablet (20 mg total) by mouth daily.  . ferrous sulfate 325 (65 FE) MG tablet Take 325 mg by mouth daily with breakfast.  . fluticasone (FLONASE) 50 MCG/ACT nasal spray Place 2 sprays into both nostrils daily as needed for allergies or rhinitis.   . Fluticasone-Salmeterol (ADVAIR) 250-50 MCG/DOSE AEPB Inhale 1 puff into the lungs 2 (two) times daily.  . furosemide  (LASIX) 80 MG tablet Take 2 tablets (160 mg total) by mouth 2 (two) times daily.  . insulin lispro (HUMALOG KWIKPEN) 100 UNIT/ML KiwkPen Inject 0.01-0.1 mLs (1-10 Units total) into the skin 3 (three) times daily. Sliding scale CBG 70 - 120: 0 units CBG 121 - 150: 1 unit,  CBG 151 - 200: 2 units,  CBG 201 - 250: 3 units,  CBG 251 - 300: 5 units,  CBG 301 - 350: 7 units,  CBG 351 - 400: 9 units   CBG > 400: 10 units and notify your MD  . isosorbide-hydrALAZINE (BIDIL) 20-37.5 MG per tablet Take 2 tablets by mouth 3 (three) times daily.  Marland Kitchen loperamide (IMODIUM) 2 MG capsule Take 1 capsule (2 mg total) by mouth 2 (two) times daily as needed for diarrhea or loose stools.  . metolazone (ZAROXOLYN) 5 MG tablet Take 5 mg by mouth as needed. FOR EDEMA  . Multiple Vitamins-Minerals (MULTIVITAMIN WITH MINERALS) tablet Take 1 tablet by mouth daily.  . ondansetron (ZOFRAN) 4 MG tablet Take 1 tablet (4 mg total) by mouth every 8 (eight) hours as needed for nausea or vomiting.  . promethazine (PHENERGAN) 25 MG tablet Take 1 tablet (25 mg total) by mouth every 4 (four) hours as needed for nausea or vomiting.  . saccharomyces boulardii (FLORASTOR) 250 MG capsule Take 1 capsule (250 mg total) by mouth 2 (two) times daily.  . sodium bicarbonate 650 MG tablet Take 1 tablet (650 mg total) by mouth 2 (two) times daily.  Nelva Nay SOLOSTAR 300 UNIT/ML SOPN Inject 40 Units into the skin every morning.  . triamcinolone cream (KENALOG) 0.1 % Apply 1 application topically 2 (two) times daily.  Marland Kitchen warfarin (COUMADIN) 1 MG tablet Take 1 tablet (1 mg total) by mouth every evening. Dose to be adjusted (Patient taking differently: Take 1.5 mg by mouth every evening. Dose to be adjusted)  . [DISCONTINUED] esomeprazole (NEXIUM) 40 MG capsule Take 1 capsule (40 mg total) by mouth 2 (two) times daily.  . [DISCONTINUED] esomeprazole (NEXIUM) 40 MG capsule Take 1 capsule once daily in the morning.  . [DISCONTINUED] ondansetron (ZOFRAN) 4 MG  tablet Take 1 tablet (4 mg total) by mouth every 8 (eight) hours as needed for nausea or vomiting.  . [DISCONTINUED] ondansetron (ZOFRAN) 4 MG tablet Take 1 tablet (4 mg total) by mouth every 8 (eight) hours as needed for nausea or vomiting.  . dicyclomine (BENTYL) 10 MG capsule Take 1 tab twice daily.  . hydrocortisone (ANUSOL-HC) 2.5 % rectal cream Place 1 application rectally 4 (four) times daily.  . [DISCONTINUED] dicyclomine (BENTYL) 10 MG capsule Take 1 tab twice daily.  . [DISCONTINUED] hydrocortisone (ANUSOL-HC) 2.5 % rectal cream Place 1 application rectally 4 (four) times daily.   No facility-administered encounter medications on file as of 02/28/2015.   Allergies  Allergen Reactions  . Adhesive [Tape] Other (See Comments)    Blisters   . Avelox [Moxifloxacin Hcl In  Nacl] Other (See Comments)    GI upset  . Codeine Other (See Comments)    "crazy"   . Guaifenesin Nausea And Vomiting    Takes Mucinex at home without issue  . Latex Swelling  . Oxycodone Nausea And Vomiting    Takes Percocet at home without issue   Patient Active Problem List   Diagnosis Date Noted  . BRBPR (bright red blood per rectum) 02/25/2015  . Chronic systolic CHF (congestive heart failure) 02/11/2015  . Pulmonary hypertension 02/11/2015  . Encounter for therapeutic drug monitoring 02/05/2015  . CHF (congestive heart failure)   . DCM (dilated cardiomyopathy) 01/23/2015  . NSTEMI (non-ST elevated myocardial infarction) 01/22/2015  . Mural thrombus of cardiac apex 01/22/2015  . Acute renal failure superimposed on stage 4 chronic kidney disease   . Hypokalemia   . Diarrhea 01/21/2015  . Sepsis 01/21/2015  . Protein-calorie malnutrition, severe 01/21/2015  . Lactic acidosis 01/20/2015  . Anemia, iron deficiency 08/31/2014  . Hyperkalemia 08/31/2014  . Acute on chronic respiratory failure with hypoxia 08/30/2014  . COPD with exacerbation 08/30/2014  . Chronic obstructive pulmonary disease with  acute exacerbation   . History of hypercalcemia 07/17/2014  . Chronic respiratory failure with hypoxia 07/17/2014  . CKD (chronic kidney disease) stage 4, GFR 15-29 ml/min 06/21/2014  . Dyslipidemia 06/21/2014  . Acute and chronic respiratory failure 04/29/2014  . Mechanical complication of other vascular device, implant, and graft 04/04/2014  . End stage renal disease 12/20/2013  . Restrictive lung disease 08/20/2013  . Diabetes mellitus with renal manifestation 06/19/2013  . Cardiorenal syndrome 04/10/2013  . COPD exacerbation 04/02/2013  . Chronic use of steroids 04/02/2013  . Atrial tachycardia 12/26/2012  . Bradycardia 10/21/2012  . Ventricular tachycardia, nonsustained 10/20/2012  . Acute renal failure 10/17/2012  . Back pain, chronic 04/28/2012  . Chronic diastolic heart failure 50/93/2671  . Acute on chronic renal failure 08/12/2011  . Sarcoidosis 11/28/2010  . HYPERCHOLESTEROLEMIA 07/29/2010  . BRONCHITIS 07/29/2010  . ASTHMA 07/29/2010  . GASTRITIS 07/29/2010  . PANCREATITIS 07/29/2010  . CARDIAC MURMUR 07/29/2010  . MONOCLONAL GAMMOPATHY 07/16/2010  . OTHER DISEASES OF SPLEEN 07/15/2010  . COLONIC POLYPS 03/12/2010  . Disorder resulting from impaired renal function 03/12/2010  . ARTERIOVENOUS MALFORMATION, COLON 03/12/2010  . NAUSEA AND VOMITING 12/15/2009  . DYSPHAGIA 12/15/2009  . CHANGE IN BOWELS 12/15/2009  . ABDOMINAL PAIN -GENERALIZED 12/15/2009  . ANXIETY 09/22/2008  . Obesity 01/29/2008  . GOUT, UNSPECIFIED 09/13/2007  . Obstructive sleep apnea 08/14/2007  . Coronary atherosclerosis 08/14/2007  . CEREBROVASCULAR DISEASE 08/14/2007  . BRONCHITIS, ACUTE 08/14/2007  . DIVERTICULOSIS OF COLON 08/14/2007  . ANEMIA-NOS 05/31/2007  . Essential hypertension 05/31/2007  . GERD 05/31/2007  . Psoriasis 05/31/2007  . DEGENERATIVE JOINT DISEASE 05/31/2007   History   Social History  . Marital Status: Single    Spouse Name: N/A  . Number of Children: 0  .  Years of Education: N/A   Occupational History  . Retired   . SECRETARY    Social History Main Topics  . Smoking status: Never Smoker   . Smokeless tobacco: Never Used  . Alcohol Use: No  . Drug Use: No  . Sexual Activity: Not Currently   Other Topics Concern  . Not on file   Social History Narrative   Lives in [place] alone.  Never married.   Retired Art therapist at [employer]. Spark's family history includes Breast cancer in her maternal aunt; Cancer in her  sister; Deep vein thrombosis in her father; Diabetes in her brother, mother, and sister; Heart attack in her brother and sister; Heart disease in her brother, father, mother, and sister; Hyperlipidemia in her brother, father, mother, sister, and sister; Varicose Veins in her mother.      Objective:    Filed Vitals:   02/28/15 1442  BP: 98/62  Pulse: 63    Physical Exam  well-developed chronically ill-appearing older African-American female in a wheelchair, she can ambulate but with difficulty and becomes dyspneic with much movement. Blood pressure 98/62 pulse 63 height 5 foot 2 weight 170. HEENT; nontraumatic normocephalic EOMI PERRLA sclera anicteric, Cardiovascular; regular rate and rhythm with O5-D6 soft systolic murmur, Pulmonary clear bilaterally, Abdomen; she has mild tenderness bilateral mid quadrant and lower quadrants no guarding or rebound no palpable mass or hepatosplenomegaly bowel sounds are active, Rectal ;exam external hemorrhoid which is inflamed and tender is tender with digital exam stool brown and heme negative, Extremities; no clubbing cyanosis or edema skin warm and dry, Neuropsych ;mood and affect appropriate       Assessment & Plan:   #1 69 yo female with fairly constant nausea , intermittent vomiting and poor appetite- I am concerned she is symptomatic from progressive renal failure with uremia #2 new crampy abdominal pain , urgency and loose stools- recent hospitalization- r/o  cdiff-she may have a component of mesenteric insufficiency   #3 chronic GERD  #4 CAD-cardiomyopathy EF 30 -35 #5 o2 dependent COPD  #6 CHF  #7 LV thrombus- on Coumadin next 2 months  #8 AODM #9 rectal bleeding secondary to external hemorrhoid- exacerbated by anticoagulation  Plan; Refill Zofran and encourage her to take one every morning the q 6 hours as need Decrease Nexium to 40 mg once daily with declining  renal fxn Add bentyl 10 mg po bid  Imodium one qam then as needed  Check stool for cdiff, lactoferrin Add Recticare 5 % lidocainr 4 x daily and Anusol hc 1% cream 4 times daily for symptomatic hemorrhoids    Amy S Esterwood PA-C 02/28/2015   Cc: Antony Blackbird, MD

## 2015-02-28 NOTE — Patient Instructions (Signed)
Please go to the basement level to our lab for stool studies.  We sent prescriptions to [place], [place].  1. Zofran ( Ondansetron) 4 mg. 2. Bentyl ( Dicyclomine) 10 mg 3. Nexium 40 mg take 1 cap daily. 4. Anusol HC cream  You can get over the counter Recticare 5 % cream, use a small amount rectally, 4 times daily.

## 2015-03-01 ENCOUNTER — Encounter (HOSPITAL_COMMUNITY): Payer: Self-pay | Admitting: Emergency Medicine

## 2015-03-01 ENCOUNTER — Inpatient Hospital Stay (HOSPITAL_COMMUNITY)
Admission: EM | Admit: 2015-03-01 | Discharge: 2015-03-06 | DRG: 393 | Disposition: A | Discharge status: Home Health | Payer: Medicare Other | Attending: Internal Medicine | Admitting: Internal Medicine

## 2015-03-01 DIAGNOSIS — N184 Chronic kidney disease, stage 4 (severe): Secondary | ICD-10-CM | POA: Diagnosis present

## 2015-03-01 DIAGNOSIS — Z91048 Other nonmedicinal substance allergy status: Secondary | ICD-10-CM

## 2015-03-01 DIAGNOSIS — R112 Nausea with vomiting, unspecified: Secondary | ICD-10-CM | POA: Diagnosis present

## 2015-03-01 DIAGNOSIS — I2510 Atherosclerotic heart disease of native coronary artery without angina pectoris: Secondary | ICD-10-CM | POA: Diagnosis present

## 2015-03-01 DIAGNOSIS — F419 Anxiety disorder, unspecified: Secondary | ICD-10-CM | POA: Diagnosis present

## 2015-03-01 DIAGNOSIS — Z885 Allergy status to narcotic agent status: Secondary | ICD-10-CM

## 2015-03-01 DIAGNOSIS — D860 Sarcoidosis of lung: Secondary | ICD-10-CM | POA: Diagnosis present

## 2015-03-01 DIAGNOSIS — Z9049 Acquired absence of other specified parts of digestive tract: Secondary | ICD-10-CM | POA: Diagnosis present

## 2015-03-01 DIAGNOSIS — Z86010 Personal history of colonic polyps: Secondary | ICD-10-CM | POA: Diagnosis not present

## 2015-03-01 DIAGNOSIS — G8929 Other chronic pain: Secondary | ICD-10-CM | POA: Diagnosis present

## 2015-03-01 DIAGNOSIS — Z794 Long term (current) use of insulin: Secondary | ICD-10-CM | POA: Diagnosis not present

## 2015-03-01 DIAGNOSIS — K5790 Diverticulosis of intestine, part unspecified, without perforation or abscess without bleeding: Secondary | ICD-10-CM | POA: Diagnosis present

## 2015-03-01 DIAGNOSIS — R55 Syncope and collapse: Secondary | ICD-10-CM | POA: Diagnosis not present

## 2015-03-01 DIAGNOSIS — J9621 Acute and chronic respiratory failure with hypoxia: Secondary | ICD-10-CM | POA: Diagnosis present

## 2015-03-01 DIAGNOSIS — Z8249 Family history of ischemic heart disease and other diseases of the circulatory system: Secondary | ICD-10-CM

## 2015-03-01 DIAGNOSIS — Z888 Allergy status to other drugs, medicaments and biological substances status: Secondary | ICD-10-CM | POA: Diagnosis not present

## 2015-03-01 DIAGNOSIS — G4733 Obstructive sleep apnea (adult) (pediatric): Secondary | ICD-10-CM | POA: Diagnosis present

## 2015-03-01 DIAGNOSIS — I472 Ventricular tachycardia: Secondary | ICD-10-CM | POA: Diagnosis not present

## 2015-03-01 DIAGNOSIS — M4806 Spinal stenosis, lumbar region: Secondary | ICD-10-CM | POA: Diagnosis present

## 2015-03-01 DIAGNOSIS — R1084 Generalized abdominal pain: Secondary | ICD-10-CM | POA: Diagnosis not present

## 2015-03-01 DIAGNOSIS — R1110 Vomiting, unspecified: Secondary | ICD-10-CM

## 2015-03-01 DIAGNOSIS — E872 Acidosis: Secondary | ICD-10-CM | POA: Diagnosis present

## 2015-03-01 DIAGNOSIS — D473 Essential (hemorrhagic) thrombocythemia: Secondary | ICD-10-CM | POA: Diagnosis present

## 2015-03-01 DIAGNOSIS — Z9081 Acquired absence of spleen: Secondary | ICD-10-CM | POA: Diagnosis present

## 2015-03-01 DIAGNOSIS — I5023 Acute on chronic systolic (congestive) heart failure: Secondary | ICD-10-CM | POA: Diagnosis not present

## 2015-03-01 DIAGNOSIS — D472 Monoclonal gammopathy: Secondary | ICD-10-CM | POA: Diagnosis present

## 2015-03-01 DIAGNOSIS — I959 Hypotension, unspecified: Secondary | ICD-10-CM | POA: Diagnosis not present

## 2015-03-01 DIAGNOSIS — K649 Unspecified hemorrhoids: Secondary | ICD-10-CM | POA: Diagnosis present

## 2015-03-01 DIAGNOSIS — J9620 Acute and chronic respiratory failure, unspecified whether with hypoxia or hypercapnia: Secondary | ICD-10-CM | POA: Diagnosis present

## 2015-03-01 DIAGNOSIS — Z96653 Presence of artificial knee joint, bilateral: Secondary | ICD-10-CM | POA: Diagnosis present

## 2015-03-01 DIAGNOSIS — M1990 Unspecified osteoarthritis, unspecified site: Secondary | ICD-10-CM | POA: Diagnosis present

## 2015-03-01 DIAGNOSIS — I5043 Acute on chronic combined systolic (congestive) and diastolic (congestive) heart failure: Secondary | ICD-10-CM | POA: Diagnosis not present

## 2015-03-01 DIAGNOSIS — I130 Hypertensive heart and chronic kidney disease with heart failure and stage 1 through stage 4 chronic kidney disease, or unspecified chronic kidney disease: Secondary | ICD-10-CM | POA: Diagnosis present

## 2015-03-01 DIAGNOSIS — E876 Hypokalemia: Secondary | ICD-10-CM | POA: Diagnosis not present

## 2015-03-01 DIAGNOSIS — K580 Irritable bowel syndrome with diarrhea: Secondary | ICD-10-CM | POA: Diagnosis present

## 2015-03-01 DIAGNOSIS — K550 Acute vascular disorders of intestine: Principal | ICD-10-CM | POA: Diagnosis present

## 2015-03-01 DIAGNOSIS — Z9981 Dependence on supplemental oxygen: Secondary | ICD-10-CM

## 2015-03-01 DIAGNOSIS — E1122 Type 2 diabetes mellitus with diabetic chronic kidney disease: Secondary | ICD-10-CM | POA: Diagnosis present

## 2015-03-01 DIAGNOSIS — Z833 Family history of diabetes mellitus: Secondary | ICD-10-CM | POA: Diagnosis not present

## 2015-03-01 DIAGNOSIS — Z90710 Acquired absence of both cervix and uterus: Secondary | ICD-10-CM | POA: Diagnosis not present

## 2015-03-01 DIAGNOSIS — Z7901 Long term (current) use of anticoagulants: Secondary | ICD-10-CM

## 2015-03-01 DIAGNOSIS — K219 Gastro-esophageal reflux disease without esophagitis: Secondary | ICD-10-CM | POA: Diagnosis present

## 2015-03-01 DIAGNOSIS — I272 Other secondary pulmonary hypertension: Secondary | ICD-10-CM | POA: Diagnosis present

## 2015-03-01 DIAGNOSIS — N3090 Cystitis, unspecified without hematuria: Secondary | ICD-10-CM | POA: Diagnosis not present

## 2015-03-01 DIAGNOSIS — M545 Low back pain: Secondary | ICD-10-CM | POA: Diagnosis present

## 2015-03-01 DIAGNOSIS — G43A1 Cyclical vomiting, intractable: Secondary | ICD-10-CM | POA: Diagnosis not present

## 2015-03-01 DIAGNOSIS — Z91040 Latex allergy status: Secondary | ICD-10-CM

## 2015-03-01 DIAGNOSIS — B9620 Unspecified Escherichia coli [E. coli] as the cause of diseases classified elsewhere: Secondary | ICD-10-CM | POA: Diagnosis present

## 2015-03-01 DIAGNOSIS — H269 Unspecified cataract: Secondary | ICD-10-CM | POA: Diagnosis present

## 2015-03-01 DIAGNOSIS — I240 Acute coronary thrombosis not resulting in myocardial infarction: Secondary | ICD-10-CM | POA: Diagnosis present

## 2015-03-01 DIAGNOSIS — Z79899 Other long term (current) drug therapy: Secondary | ICD-10-CM | POA: Diagnosis not present

## 2015-03-01 DIAGNOSIS — E785 Hyperlipidemia, unspecified: Secondary | ICD-10-CM | POA: Diagnosis present

## 2015-03-01 DIAGNOSIS — M109 Gout, unspecified: Secondary | ICD-10-CM | POA: Diagnosis present

## 2015-03-01 DIAGNOSIS — E875 Hyperkalemia: Secondary | ICD-10-CM | POA: Diagnosis not present

## 2015-03-01 DIAGNOSIS — N179 Acute kidney failure, unspecified: Secondary | ICD-10-CM | POA: Insufficient documentation

## 2015-03-01 DIAGNOSIS — J449 Chronic obstructive pulmonary disease, unspecified: Secondary | ICD-10-CM | POA: Diagnosis present

## 2015-03-01 DIAGNOSIS — I255 Ischemic cardiomyopathy: Secondary | ICD-10-CM | POA: Diagnosis present

## 2015-03-01 DIAGNOSIS — G43A0 Cyclical vomiting, not intractable: Secondary | ICD-10-CM | POA: Diagnosis not present

## 2015-03-01 LAB — COMPREHENSIVE METABOLIC PANEL
ALT: 14 U/L (ref 14–54)
AST: 20 U/L (ref 15–41)
Albumin: 2.7 g/dL — ABNORMAL LOW (ref 3.5–5.0)
Alkaline Phosphatase: 156 U/L — ABNORMAL HIGH (ref 38–126)
Anion gap: 16 — ABNORMAL HIGH (ref 5–15)
BILIRUBIN TOTAL: 0.7 mg/dL (ref 0.3–1.2)
BUN: 50 mg/dL — AB (ref 6–20)
CHLORIDE: 107 mmol/L (ref 101–111)
CO2: 15 mmol/L — AB (ref 22–32)
CREATININE: 3.63 mg/dL — AB (ref 0.44–1.00)
Calcium: 9.7 mg/dL (ref 8.9–10.3)
GFR, EST AFRICAN AMERICAN: 14 mL/min — AB (ref >60)
GFR, EST NON AFRICAN AMERICAN: 12 mL/min — AB (ref >60)
GLUCOSE: 230 mg/dL — AB (ref 65–99)
Potassium: 3.4 mmol/L — ABNORMAL LOW (ref 3.5–5.1)
Sodium: 138 mmol/L (ref 135–145)
Total Protein: 6.8 g/dL (ref 6.5–8.1)

## 2015-03-01 LAB — URINALYSIS, ROUTINE W REFLEX MICROSCOPIC
BILIRUBIN URINE: NEGATIVE
Glucose, UA: 250 mg/dL — AB
KETONES UR: 15 mg/dL — AB
Leukocytes, UA: NEGATIVE
Nitrite: NEGATIVE
PH: 7 (ref 5.0–8.0)
Protein, ur: >300 mg/dL — AB
SPECIFIC GRAVITY, URINE: 1.013 (ref 1.005–1.030)
Urobilinogen, UA: 0.2 mg/dL (ref 0.0–1.0)

## 2015-03-01 LAB — PROTIME-INR
INR: 2.29 — AB (ref 0.00–1.49)
Prothrombin Time: 25 seconds — ABNORMAL HIGH (ref 11.6–15.2)

## 2015-03-01 LAB — CBC
HCT: 41.4 % (ref 36.0–46.0)
HEMOGLOBIN: 14 g/dL (ref 12.0–15.0)
MCH: 29 pg (ref 26.0–34.0)
MCHC: 33.8 g/dL (ref 30.0–36.0)
MCV: 85.9 fL (ref 78.0–100.0)
Platelets: 809 10*3/uL — ABNORMAL HIGH (ref 150–400)
RBC: 4.82 MIL/uL (ref 3.87–5.11)
RDW: 16.7 % — ABNORMAL HIGH (ref 11.5–15.5)
WBC: 10.1 10*3/uL (ref 4.0–10.5)

## 2015-03-01 LAB — URINE MICROSCOPIC-ADD ON

## 2015-03-01 LAB — GLUCOSE, CAPILLARY
GLUCOSE-CAPILLARY: 186 mg/dL — AB (ref 65–99)
GLUCOSE-CAPILLARY: 140 mg/dL — AB (ref 65–99)
Glucose-Capillary: 214 mg/dL — ABNORMAL HIGH (ref 65–99)

## 2015-03-01 LAB — I-STAT CG4 LACTIC ACID, ED
LACTIC ACID, VENOUS: 1.65 mmol/L (ref 0.5–2.0)
Lactic Acid, Venous: 1.22 mmol/L (ref 0.5–2.0)

## 2015-03-01 LAB — PHOSPHORUS: Phosphorus: 4.6 mg/dL (ref 2.5–4.6)

## 2015-03-01 LAB — MAGNESIUM: MAGNESIUM: 1.6 mg/dL — AB (ref 1.7–2.4)

## 2015-03-01 LAB — CBG MONITORING, ED: GLUCOSE-CAPILLARY: 211 mg/dL — AB (ref 65–99)

## 2015-03-01 LAB — TSH: TSH: 0.895 u[IU]/mL (ref 0.350–4.500)

## 2015-03-01 LAB — LIPASE, BLOOD: Lipase: 24 U/L (ref 22–51)

## 2015-03-01 MED ORDER — ALBUTEROL SULFATE (2.5 MG/3ML) 0.083% IN NEBU: 2.5000 mg | INHALATION_SOLUTION | RESPIRATORY_TRACT | Status: DC | PRN

## 2015-03-01 MED ORDER — CARVEDILOL 12.5 MG PO TABS
12.5000 mg | ORAL_TABLET | Freq: Two times a day (BID) | ORAL | Status: DC
  Administered 2015-03-02 – 2015-03-03 (×2): 12.5 mg via ORAL
  Filled 2015-03-01 (×5): qty 1

## 2015-03-01 MED ORDER — DICYCLOMINE HCL 10 MG PO CAPS
10.0000 mg | ORAL_CAPSULE | Freq: Three times a day (TID) | ORAL | Status: DC
  Administered 2015-03-02 – 2015-03-06 (×14): 10 mg via ORAL
  Filled 2015-03-01 (×17): qty 1

## 2015-03-01 MED ORDER — WARFARIN - PHARMACIST DOSING INPATIENT
Freq: Every day | Status: DC
  Administered 2015-03-06: 18:00:00

## 2015-03-01 MED ORDER — CETYLPYRIDINIUM CHLORIDE 0.05 % MT LIQD
7.0000 mL | Freq: Two times a day (BID) | OROMUCOSAL | Status: DC
  Administered 2015-03-01 – 2015-03-04 (×4): 7 mL via OROMUCOSAL

## 2015-03-01 MED ORDER — GLUCERNA 1.2 CAL PO LIQD
237.0000 mL | Freq: Three times a day (TID) | ORAL | Status: DC
  Administered 2015-03-02 (×3): 237 mL via ORAL
  Filled 2015-03-01 (×19): qty 237

## 2015-03-01 MED ORDER — ONDANSETRON HCL 4 MG PO TABS: 4.0000 mg | ORAL_TABLET | Freq: Four times a day (QID) | ORAL | Status: DC | PRN

## 2015-03-01 MED ORDER — FLUTICASONE PROPIONATE 50 MCG/ACT NA SUSP: 2.0000 | Freq: Every day | NASAL | Status: DC | PRN

## 2015-03-01 MED ORDER — MORPHINE SULFATE 4 MG/ML IJ SOLN
4.0000 mg | Freq: Once | INTRAMUSCULAR | Status: AC
  Administered 2015-03-01: 4 mg via INTRAVENOUS
  Filled 2015-03-01: qty 1

## 2015-03-01 MED ORDER — FAMOTIDINE 20 MG PO TABS
20.0000 mg | ORAL_TABLET | Freq: Every day | ORAL | Status: DC
Start: 2015-03-01 — End: 2015-03-06
  Administered 2015-03-02 – 2015-03-06 (×5): 20 mg via ORAL
  Filled 2015-03-01 (×5): qty 1

## 2015-03-01 MED ORDER — METOLAZONE 5 MG PO TABS
5.0000 mg | ORAL_TABLET | Freq: Every day | ORAL | Status: DC
  Administered 2015-03-01 – 2015-03-02 (×2): 5 mg via ORAL
  Filled 2015-03-01 (×2): qty 1

## 2015-03-01 MED ORDER — MORPHINE SULFATE 2 MG/ML IJ SOLN
2.0000 mg | INTRAMUSCULAR | Status: DC | PRN
  Administered 2015-03-01 – 2015-03-05 (×3): 2 mg via INTRAVENOUS
  Filled 2015-03-01 (×3): qty 1

## 2015-03-01 MED ORDER — FERROUS SULFATE 325 (65 FE) MG PO TABS
325.0000 mg | ORAL_TABLET | Freq: Every day | ORAL | Status: DC
  Administered 2015-03-02 – 2015-03-03 (×2): 325 mg via ORAL
  Filled 2015-03-01 (×4): qty 1

## 2015-03-01 MED ORDER — WARFARIN SODIUM 1 MG PO TABS
1.5000 mg | ORAL_TABLET | Freq: Every day | ORAL | Status: DC
  Administered 2015-03-01 – 2015-03-02 (×2): 1.5 mg via ORAL
  Filled 2015-03-01 (×4): qty 1

## 2015-03-01 MED ORDER — ISOSORB DINITRATE-HYDRALAZINE 20-37.5 MG PO TABS
2.0000 | ORAL_TABLET | Freq: Three times a day (TID) | ORAL | Status: DC
  Administered 2015-03-01 – 2015-03-02 (×3): 2 via ORAL
  Filled 2015-03-01 (×7): qty 2

## 2015-03-01 MED ORDER — SODIUM BICARBONATE 650 MG PO TABS
650.0000 mg | ORAL_TABLET | Freq: Two times a day (BID) | ORAL | Status: DC
  Administered 2015-03-01 – 2015-03-06 (×10): 650 mg via ORAL
  Filled 2015-03-01 (×11): qty 1

## 2015-03-01 MED ORDER — ONDANSETRON HCL 4 MG/2ML IJ SOLN
4.0000 mg | Freq: Four times a day (QID) | INTRAMUSCULAR | Status: DC | PRN
  Administered 2015-03-01 (×2): 4 mg via INTRAVENOUS
  Filled 2015-03-01 (×2): qty 2

## 2015-03-01 MED ORDER — HYDROCORTISONE 2.5 % RE CREA
1.0000 "application " | TOPICAL_CREAM | Freq: Four times a day (QID) | RECTAL | Status: DC
  Administered 2015-03-01 – 2015-03-06 (×17): 1 via RECTAL
  Filled 2015-03-01: qty 28.35

## 2015-03-01 MED ORDER — MOMETASONE FURO-FORMOTEROL FUM 100-5 MCG/ACT IN AERO
2.0000 | INHALATION_SPRAY | Freq: Two times a day (BID) | RESPIRATORY_TRACT | Status: DC
  Administered 2015-03-01 – 2015-03-06 (×11): 2 via RESPIRATORY_TRACT
  Filled 2015-03-01: qty 8.8

## 2015-03-01 MED ORDER — PANTOPRAZOLE SODIUM 40 MG PO TBEC
40.0000 mg | DELAYED_RELEASE_TABLET | Freq: Every day | ORAL | Status: DC
  Administered 2015-03-02 – 2015-03-06 (×5): 40 mg via ORAL
  Filled 2015-03-01 (×4): qty 1

## 2015-03-01 MED ORDER — PROMETHAZINE HCL 25 MG PO TABS: 25.0000 mg | ORAL_TABLET | ORAL | Status: DC | PRN

## 2015-03-01 MED ORDER — PROMETHAZINE HCL 25 MG/ML IJ SOLN
12.5000 mg | Freq: Once | INTRAMUSCULAR | Status: AC
  Administered 2015-03-01: 12.5 mg via INTRAVENOUS
  Filled 2015-03-01: qty 1

## 2015-03-01 MED ORDER — CHLORHEXIDINE GLUCONATE 0.12 % MT SOLN
15.0000 mL | Freq: Two times a day (BID) | OROMUCOSAL | Status: DC
  Administered 2015-03-01 – 2015-03-06 (×8): 15 mL via OROMUCOSAL
  Filled 2015-03-01 (×11): qty 15

## 2015-03-01 MED ORDER — SACCHAROMYCES BOULARDII 250 MG PO CAPS
250.0000 mg | ORAL_CAPSULE | Freq: Two times a day (BID) | ORAL | Status: DC
Start: 2015-03-01 — End: 2015-03-06
  Administered 2015-03-01 – 2015-03-06 (×10): 250 mg via ORAL
  Filled 2015-03-01 (×11): qty 1

## 2015-03-01 MED ORDER — SODIUM CHLORIDE 0.9 % IJ SOLN
3.0000 mL | Freq: Two times a day (BID) | INTRAMUSCULAR | Status: DC
  Administered 2015-03-01 – 2015-03-06 (×10): 3 mL via INTRAVENOUS

## 2015-03-01 MED ORDER — ATORVASTATIN CALCIUM 20 MG PO TABS
20.0000 mg | ORAL_TABLET | Freq: Every day | ORAL | Status: DC
Start: 2015-03-01 — End: 2015-03-06
  Administered 2015-03-01 – 2015-03-05 (×5): 20 mg via ORAL
  Filled 2015-03-01 (×5): qty 1

## 2015-03-01 MED ORDER — INSULIN GLARGINE 100 UNIT/ML ~~LOC~~ SOLN
40.0000 [IU] | Freq: Every morning | SUBCUTANEOUS | Status: DC
  Administered 2015-03-01 – 2015-03-06 (×5): 40 [IU] via SUBCUTANEOUS
  Filled 2015-03-01 (×6): qty 0.4

## 2015-03-01 MED ORDER — LOPERAMIDE HCL 2 MG PO CAPS
2.0000 mg | ORAL_CAPSULE | Freq: Two times a day (BID) | ORAL | Status: DC | PRN
  Administered 2015-03-04: 2 mg via ORAL
  Filled 2015-03-01 (×3): qty 1

## 2015-03-01 MED ORDER — ACETAMINOPHEN 500 MG PO TABS
1000.0000 mg | ORAL_TABLET | Freq: Two times a day (BID) | ORAL | Status: DC | PRN
  Administered 2015-03-02 – 2015-03-03 (×2): 1000 mg via ORAL
  Filled 2015-03-01 (×2): qty 2

## 2015-03-01 MED ORDER — ONDANSETRON HCL 4 MG/2ML IJ SOLN
4.0000 mg | Freq: Once | INTRAMUSCULAR | Status: AC
  Administered 2015-03-01: 4 mg via INTRAVENOUS
  Filled 2015-03-01: qty 2

## 2015-03-01 MED ORDER — IPRATROPIUM-ALBUTEROL 0.5-2.5 (3) MG/3ML IN SOLN
3.0000 mL | Freq: Four times a day (QID) | RESPIRATORY_TRACT | Status: DC
  Administered 2015-03-01: 3 mL via RESPIRATORY_TRACT
  Filled 2015-03-01: qty 3

## 2015-03-01 MED ORDER — BOOST / RESOURCE BREEZE PO LIQD
1.0000 | Freq: Three times a day (TID) | ORAL | Status: DC
  Administered 2015-03-01 – 2015-03-05 (×4): 1 via ORAL

## 2015-03-01 MED ORDER — INSULIN ASPART 100 UNIT/ML ~~LOC~~ SOLN
0.0000 [IU] | Freq: Three times a day (TID) | SUBCUTANEOUS | Status: DC
  Administered 2015-03-01: 2 [IU] via SUBCUTANEOUS
  Administered 2015-03-01: 3 [IU] via SUBCUTANEOUS
  Administered 2015-03-04: 1 [IU] via SUBCUTANEOUS
  Administered 2015-03-05: 2 [IU] via SUBCUTANEOUS
  Administered 2015-03-05 – 2015-03-06 (×2): 9 [IU] via SUBCUTANEOUS
  Administered 2015-03-06: 2 [IU] via SUBCUTANEOUS
  Administered 2015-03-06: 5 [IU] via SUBCUTANEOUS

## 2015-03-01 MED ORDER — FUROSEMIDE 80 MG PO TABS
160.0000 mg | ORAL_TABLET | Freq: Two times a day (BID) | ORAL | Status: DC
  Administered 2015-03-01 – 2015-03-02 (×3): 160 mg via ORAL
  Filled 2015-03-01 (×2): qty 2
  Filled 2015-03-01: qty 4
  Filled 2015-03-01 (×3): qty 2

## 2015-03-01 NOTE — H&P (Signed)
[facility] History and Physical  Aurelie A Marijo File BWL:[id number] DOB: 06-07-1946 DOA: 03/01/2015  Referring physician: ED PA, Dahlia Bailiff  PCP: Antony Blackbird, MD   Chief Complaint: N/V/D  HPI:  Patient is 69 year old female with known history of chronic nausea and vomiting with abdominal cramping, follows with gastroenterologist at [facility] clinic, also with known history of diabetes, hypertension, stage IV chronic kidney disease with baseline creatinine between 3 and 4, chronic combined systolic and diastolic CHF, recently diagnosed with left ventricular thrombus and has been on Coumadin for several months, 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 Patient now presents to [facility] emergency department with main concern of progressively worsening nausea and nonbloody vomiting, much worse than typical baseline, reports 3 episodes of nonbloody vomiting since arriving to emergency department, also reports 3-4 episodes of watery diarrhea over the past week. Patient says she has been weak and tired, has poor oral intake, has had numerous tests done in the past and is still not sure what's causing her symptoms. Patient reports she is seeing a nephrologist for her kidney disease and was told she may need dialysis soon and that her symptoms may be due to progressive kidney disease. Patient explains she has had some generalized abdominal discomfort, crampy, intermittent, nonradiating, 7/10 in severity when present, no specific alleviating factors, worse when eating. Patient denies fevers and chills, no chest pain or shortness of breath. Please note patient had recent HIDA scan which was unremarkable.  In emergency department, patient noted to be in mild distress due to nausea and abdominal pain, vital signs notable for respiratory rate up to 32, initial blood pressure 213/124, oxygen saturation 89% on 2 L nasal cannula, repeat blood pressure 188/92.  Active  Problems: Principal Problem:   Nausea and vomiting, diarrhea - Appreciate gastroenterology's team following - Continue Zofran, Nexium - Bentyl 10 mg by mouth twice a day added - Stool studies for C. 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 - Monitor renal function closely - Continue bicarbonate supplements as per home medical regimen    Hypokalemia - In the setting of renal disease and vomiting, diarrhea - Repeat BMP in the morning    Thrombocytosis - Chronic, continue to monitor    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 - Continue home insulin regimen - Add sliding scale for better CBG control    Acute on chronic respiratory failure - In patient with known oxygen-dependent COPD, chronic combined systolic and diastolic CHF - Oxygen saturation initially 89% on 2 L but currently stable 100% on 2 L - Continue broncho-dilators scheduled and as needed    Hypertensive urgency - Patient at home takes Coreg, isosorbide-hydralazine, metolazone, Lasix - Add hydralazine as needed for now    Recent diagnosis of left mural thrombus - Coumadin per pharmacy    DVT prophylaxis  - Patient already on Coumadin   Radiological Exams on Admission: No results found.   Code Status: Full Family Communication: Pt  at bedside Disposition Plan: Admit for further evaluation    Mart Piggs [facility] [id number]   Review of Systems:  Constitutional: Negative for diaphoresis.  HENT: Negative for hearing loss, ear pain,  nosebleeds, congestion, sore throat, neck pain, tinnitus and ear discharge.   Eyes: Negative for blurred vision, double vision, photophobia, pain, discharge and redness.  Respiratory: Negative for wheezing and stridor.   Cardiovascular: Negative for chest pain, palpitations, orthopnea, claudication and leg swelling.  Gastrointestinal: Negative for heartburn, constipation, blood in stool and melena.  Genitourinary: Negative for dysuria, urgency, frequency, hematuria and flank pain.  Musculoskeletal: Negative for myalgias, back pain, joint pain and falls.  Skin: Negative for itching and rash.  Neurological: Negative for dizziness and weakness.  Endo/Heme/Allergies: Negative for environmental allergies and polydipsia. Does not bruise/bleed easily.  Psychiatric/Behavioral: Negative for suicidal ideas. The patient is not nervous/anxious.      Past Medical History  Diagnosis Date  . Essential hypertension, benign   . Coronary atherosclerosis of native coronary artery 01/04/2006    Tiny OM1 70-90% ostial stenosis, no other CAD  . COPD (chronic obstructive pulmonary disease)   . Esophageal reflux   . Dyslipidemia   . Gout   . Colon polyps 2011    Tubular adenomatous polyps  . Spinal stenosis of lumbar region     chronic low back pain.   . Bulging lumbar disc   . Chronic diastolic heart failure   . PNA (pneumonia) 2012    With pleural effusion, requiring thoracentesis  . Chronic bronchitis   . On home oxygen therapy   . OSA (obstructive sleep apnea)     Marginally compliant with CPAP  . Type II diabetes mellitus   . Arthritis   . Diverticulosis   . GERD (gastroesophageal reflux disease)   . Anxiety   . Hyperlipidemia   . Sarcoidosis of lung   . CKD (chronic kidney disease) stage 4, GFR 15-29 ml/min   . IBS (irritable bowel syndrome)   . Cataracts, both eyes   . Headache(784.0)   . Lactic acidosis 01/21/2015  . Chronic CHF (congestive heart failure)     Past Surgical History   Procedure Laterality Date  . Vesicovaginal fistula closure w/  total abdominal hysterectomy  1992  . Bilateral total knee replacements Bilateral Rt=5/04 & Lft=1/09    by DrAlusio  . 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 Zella Richer  . Appendectomy  1957  . Tonsillectomy  1968  . Cholecystectomy  1980's  . Abdominal hysterectomy  1992  . Reduction mammaplasty Bilateral 1980's  . Cardiac catheterization  1990's  . Thoracentesis  2012  . Joint replacement    . Av fistula placement Left 12/31/2013    Procedure: ARTERIOVENOUS (AV) FISTULA CREATION;  Surgeon: Elam Dutch, MD;  Location: [facility];  Service: Vascular;  Laterality: Left;  . Colonoscopy w/ biopsies and polypectomy    . Av fistula placement Left 03/18/2014    Procedure: ARTERIOVENOUS (AV) FISTULA CREATION- LEFT BRACHIOCEPHALIC ;  Surgeon: Elam Dutch, MD;  Location: [facility] OR;  Service: Vascular;  Laterality: Left;  . Ligation of arteriovenous  fistula Left 03/18/2014    Procedure: LIGATION OF ARTERIOVENOUS  FISTULA- LEFT RADIOCEPHALIC;  Surgeon: Elam Dutch, MD;  Location: [facility];  Service: Vascular;  Laterality: Left;  . Cardiac catheterization N/A 01/27/2015    Procedure: Right Heart Cath;  Surgeon: Larey Dresser, MD;  Location: [facility];  Service: Cardiovascular;  Laterality: N/A;  Social History:  reports that she has never smoked. She has never used smokeless tobacco. She reports that she does not drink alcohol or use illicit drugs.  Allergies  Allergen Reactions  . Adhesive [Tape] Other (See Comments)    Blisters   . Avelox [Moxifloxacin Hcl In Nacl] Other (See Comments)    GI upset  . Codeine Other (See Comments)    "crazy"   . Guaifenesin Nausea And Vomiting    Takes Mucinex at home without issue  . Latex Swelling  . Oxycodone Nausea And Vomiting    Takes Percocet at home without issue    Family History  Problem Relation Age of Onset  . Diabetes Mother   . Heart disease Mother   .  Hyperlipidemia Mother   . Varicose Veins Mother   . Breast cancer Maternal Aunt   . Heart disease Father   . Deep vein thrombosis Father   . Hyperlipidemia Father   . Heart disease Sister     before age 33  . Cancer Sister   . Diabetes Sister   . Hyperlipidemia Sister   . Heart attack Sister   . Heart disease Brother   . Diabetes Brother   . Hyperlipidemia Brother   . Heart attack Brother   . Hyperlipidemia Sister     Medication Sig  albuterol 108 (90 BASE) MCG/ACT inhaler Inhale 2 puffs into the lungs every 4 hours as needed   albuterol (PROVENTIL) (2.5 MG/3ML) 0.083% nebulizer solution Take 2.5 mg by nebulization every 2 (two) hours as needed for wheezing or shortness of breath.  Apremilast (OTEZLA) 30 MG TABS Take 30 mg by mouth daily.   atorvastatin (LIPITOR) 20 MG tablet Take 20 mg by mouth at bedtime.  carvedilol (COREG) 12.5 MG tablet Take 1 tablet 2 (two) times daily with a meal.  dicyclomine (BENTYL) 10 MG capsule Take 1 tab twice daily.  esomeprazole (NEXIUM) 40 MG capsule Take 1 capsule once daily in the morning.  famotidine (PEPCID) 20 MG tablet Take 1 tablet (20 mg total) by mouth daily.  ferrous sulfate 325 (65 FE) MG tablet Take 325 mg by mouth daily with breakfast.  fluticasone (FLONASE) 50 MCG/ACT nasal spray Place 2 sprays into both nostrils   Fluticasone-Salmeterol (ADVAIR) 250-50  Inhale 1 puff into the lungs 2 (two) times daily.  furosemide (LASIX) 80 MG tablet Take 2 tablets (160 mg total) by mouth 2 (two) times daily.  insulin lispro (HUMALOG KWIKPEN) 100 UNIT/ML KiwkPen Inject 0.01-0.1 mLs (1-10 Units total) into the skin 3 (three) times daily. Sliding scale  isosorbide-hydrALAZINE 20-37.5 MG per tablet Take 2 tablets by mouth 3 (three) times daily.  metolazone (ZAROXOLYN) 5 MG tablet Take 5 mg by mouth as needed. FOR EDEMA  ondansetron (ZOFRAN) 4 MG tablet Take 1 tablet every 8 (eight) hours as needed   promethazine (PHENERGAN) 25 MG tablet Take 1 tablet   every 4 (four) hours as needed  saccharomyces boulardii  250 MG capsule Take 1 capsule 2 (two) times daily.  sodium bicarbonate 650 MG tablet Take 1 tablet (650 mg total) by mouth 2 (two) times daily.  triamcinolone cream (KENALOG) 0.1 % Apply 1 application topically 2 (two) times daily.  warfarin (COUMADIN) 1 MG tablet Take 1 tablet (1 mg total) by mouth every evening.     Physical Exam: Filed Vitals:   03/01/15 0630 03/01/15 0700 03/01/15 0800 03/01/15 0832  BP: 136/110 141/125 145/105 149/99  Pulse: 55 93 75 74  Temp:  TempSrc:      Resp: 32 31 30   SpO2: 100% 89% 100% 99%    Physical Exam  Constitutional: Appears well-developed and well-nourished., In mild distress due to nausea and pain  HENT: Normocephalic. External right and left ear normal. Oropharynx is clear and moist.  Eyes: Conjunctivae and EOM are normal. PERRLA, no scleral icterus.  Neck: Normal ROM. Neck supple. No JVD. No tracheal deviation. No thyromegaly.  CVS: RRR, S1/S2 +, no gallops, no carotid bruit.  Pulmonary: Effort and breath sounds normal, no stridor,  diminished breath sounds at bases  Abdominal: Soft. BS +,  no distension, tendernessand epigastric area with voluntary guarding but no rebound Musculoskeletal: Normal range of motion.  Lymphadenopathy: No lymphadenopathy noted, cervical, inguinal. Neuro: Alert. Normal reflexes, muscle tone coordination. No cranial nerve deficit. Skin: Skin is warm and dry. No rash noted. Not diaphoretic. No erythema. No pallor.  Psychiatric: Normal mood and affect. Behavior, judgment, thought content normal.   Labs on Admission:  Basic Metabolic Panel:  Recent Labs Lab 02/24/15 1612 03/01/15 0445  NA 138 138  K 4.4 3.4*  CL 106 107  CO2 18* 15*  GLUCOSE 78 230*  BUN 71* 50*  CREATININE 4.09* 3.63*  CALCIUM 10.0 9.7   Liver Function Tests:  Recent Labs Lab 02/24/15 1738 03/01/15 0445  AST 33 20  ALT 17 14  ALKPHOS 138* 156*  BILITOT 0.6 0.7  PROT  6.0* 6.8  ALBUMIN 2.5* 2.7*    Recent Labs Lab 02/24/15 1738 03/01/15 0445  LIPASE 68* 24   CBC:  Recent Labs Lab 02/24/15 1612 03/01/15 0445  WBC 9.0 10.1  HGB 13.2 14.0  HCT 39.9 41.4  MCV 88.7 85.9  PLT 581* 809*    EKG: pending    If 7PM-7AM, please contact night-coverage [email or web address] Password [facility] 03/01/2015, 8:53 AM

## 2015-03-01 NOTE — ED Notes (Signed)
PA at bedside.

## 2015-03-01 NOTE — Care Management Note (Signed)
Case Management Note  Patient Details  Name: RITAJ DULLEA MRN: [id number] Date of Birth: [birth year]  Subjective/Objective:   69 y.o. F admitted with persistent N,V. Lives in private Residence with spouse.                  Action/Plan:Will follow for discharge needs.   Expected Discharge Date:                  Expected Discharge Plan:     In-House Referral:     Discharge planning Services  CM Consult  Post Acute Care Choice:    Choice offered to:     DME Arranged:    DME Agency:     HH Arranged:    HH Agency:     Status of Service:  In process, will continue to follow  Medicare Important Message Given:    Date Medicare IM Given:    Medicare IM give by:    Date Additional Medicare IM Given:    Additional Medicare Important Message give by:     If discussed at [place] of Stay Meetings, dates discussed:    Additional Comments:  Delrae Sawyers, RN 03/01/2015, 9:27 AM

## 2015-03-01 NOTE — ED Notes (Signed)
PA stated to resume PO Home meds as soon as pt is tolerating liquids, pt has kept down ginerale for 20 mins.

## 2015-03-01 NOTE — Consult Note (Addendum)
Referring Provider: [facility] Primary Care Physician:  Antony Blackbird, MD Primary Gastroenterologist:  Dr. Scarlette Shorts  Reason for Consultation:   Nausea, vomiting, diarrhea    HPI: [name] is a 69 y.o. female who is known to Dr. Henrene Pastor. She was seen in the GI office yesterday. She has been complaining of fairly constant nausea with episodes of vomiting, abdominal cramping, and loose stools for 3 months. Patient has a rather extensive medical history and was hospitalized earlier in July with an acute exacerbation of congestive heart failure. She also has stage IV chronic kidney disease and is approaching need for dialysis as her creatinine has been around 4. She has diabetes mellitus, cardiomyopathy with ejection fraction of 30-35%, and was recently diagnosed with a left ventricular thrombus for which she is on Coumadin over the next few months. She also has a history of COPD with chronic hypoxemia and is on oxygen at 2 L at home. She has a history of coronary artery disease. Patient says the symptoms have been ongoing for about 3 months. When she was released from the hospital a few weeks ago she felt a bit better for a couple of days but shortly thereafter began to feel nauseous almost every day. She has intermittent episodes of vomiting and her appetite as been poor. She has been having loose stools with 3-5 bowel movements per day. Her stools are moderate in volume, malodorous, but nonbloody. She was seen in nephrology earlier this week and it supposed to have follow-up labs next week. She reports that they are aware she is feeling so poorly and are anticipating that she may need another graft placed to start dialysis soon. She has used Zofran intermittently which does provide some relief. Though she says she has had no bloody stools, she says she has had some rectal bleeding. She was in the emergency room on 02/24/2015 with complaints of abdominal pain and bleeding which were felt to  be hemorrhoidal. She says that after she went home from her GI visit yesterday, she had some broth around 4 PM and then began to feel very nauseous and have some epigastric pain.  12/2009 Colonoscopy: for abdominal pain and change in bowel habits.4 polyps removed (ascending, transverse, rectum; tubular adenoma and mucal prolapse polyp on path). Ascending AVM. Moderate sigmoid tics.  12/2009 EGD: Dysphagia, abdominal pain.  Normal.  CT abdomen pelvis without contrast 12/16/14: Nothing acute, stable 1.3 cm low attenuation lesion right liver lobe. Subtle liver nodularity on left, suggestive of cirrhosis.  CT abdomen pelvis without contrast. 12/21/14: nothing acute. Scattered colon tics, scattered calcifications of coronaries, abdominal aorta and branches, stable low attenuation liver lesion. Chronic, anterior, soft tissue inflammation of abdominal wall.  CT abdomen pelvis without contrast 01/20/15: Atherosclerosis, including left anterior descending coronary artery disease. Status post splenectomy. Multiple splenules. Moderate atherosclerosis throughout the abdominal and pelvic vasculature, without definite aneurysm. Moderate atherosclerosis throughout the abdominal and pelvic vasculature, without definite aneurysm. Status post cholecystectomy. Stable right liver lesion  GES 01/02/2015 was a normal gastric emptying study.    Past Medical History  Diagnosis Date  . Essential hypertension, benign   . Coronary atherosclerosis of native coronary artery 01/04/2006    Tiny OM1 70-90% ostial stenosis, no other CAD  . COPD (chronic obstructive pulmonary disease)   . Esophageal reflux   . Dyslipidemia   . Gout   . Colon polyps 2011    Tubular adenomatous polyps  . Spinal stenosis of lumbar region  chronic low back pain.   . Bulging lumbar disc   . Chronic diastolic heart failure   . PNA (pneumonia) 2012    With pleural effusion, requiring thoracentesis  . Chronic bronchitis   . On  home oxygen therapy   . OSA (obstructive sleep apnea)     Marginally compliant with CPAP  . Type II diabetes mellitus   . Arthritis   . Diverticulosis   . GERD (gastroesophageal reflux disease)   . Anxiety   . Hyperlipidemia   . Sarcoidosis of lung   . CKD (chronic kidney disease) stage 4, GFR 15-29 ml/min   . IBS (irritable bowel syndrome)   . Cataracts, both eyes   . Headache(784.0)   . Lactic acidosis 01/21/2015  . Chronic CHF (congestive heart failure)     Past Surgical History  Procedure Laterality Date  . Vesicovaginal fistula closure w/  total abdominal hysterectomy  1992  . Bilateral total knee replacements Bilateral Rt=5/04 & Lft=1/09    by DrAlusio  . 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 Zella Richer  . Appendectomy  1957  . Tonsillectomy  1968  . Cholecystectomy  1980's  . Abdominal hysterectomy  1992  . Reduction mammaplasty Bilateral 1980's  . Cardiac catheterization  1990's  . Thoracentesis  2012  . Joint replacement    . Av fistula placement Left 12/31/2013    Procedure: ARTERIOVENOUS (AV) FISTULA CREATION;  Surgeon: Elam Dutch, MD;  Location: [place];  Service: Vascular;  Laterality: Left;  . Colonoscopy w/ biopsies and polypectomy    . Av fistula placement Left 03/18/2014    Procedure: ARTERIOVENOUS (AV) FISTULA CREATION- LEFT BRACHIOCEPHALIC ;  Surgeon: Elam Dutch, MD;  Location: [facility] OR;  Service: Vascular;  Laterality: Left;  . Ligation of arteriovenous  fistula Left 03/18/2014    Procedure: LIGATION OF ARTERIOVENOUS  FISTULA- LEFT RADIOCEPHALIC;  Surgeon: Elam Dutch, MD;  Location: [place];  Service: Vascular;  Laterality: Left;  . Cardiac catheterization N/A 01/27/2015    Procedure: Right Heart Cath;  Surgeon: Larey Dresser, MD;  Location: [place] [facility];  Service: Cardiovascular;  Laterality: N/A;    Prior to Admission medications   Medication Sig Start Date End Date Taking? Authorizing Provider  acetaminophen (TYLENOL) 500 MG tablet Take  1,000 mg by mouth 2 (two) times daily as needed (pain).    Yes Historical Provider, MD  Alpha-D-Galactosidase (BEANO PO) Take 1 tablet by mouth daily.   Yes Historical Provider, MD  Apremilast (OTEZLA) 30 MG TABS Take 30 mg by mouth daily.    Yes Historical Provider, MD  atorvastatin (LIPITOR) 20 MG tablet Take 20 mg by mouth at bedtime.   Yes Historical Provider, MD  carvedilol (COREG) 12.5 MG tablet Take 1 tablet (12.5 mg total) by mouth 2 (two) times daily with a meal. 02/02/15  Yes Ripudeep K Rai, MD  dicyclomine (BENTYL) 10 MG capsule Take 1 tab twice daily. 02/28/15  Yes Amy S Esterwood, PA-C  esomeprazole (NEXIUM) 40 MG capsule Take 1 capsule once daily in the morning. 02/28/15  Yes Amy S Esterwood, PA-C  famotidine (PEPCID) 20 MG tablet Take 1 tablet (20 mg total) by mouth daily. 02/02/15  Yes Ripudeep Krystal Eaton, MD  ferrous sulfate 325 (65 FE) MG tablet Take 325 mg by mouth daily with breakfast.   Yes Historical Provider, MD  fluticasone (FLONASE) 50 MCG/ACT nasal spray Place 2 sprays into both nostrils daily as needed for allergies or rhinitis.  Yes Historical Provider, MD  Fluticasone-Salmeterol (ADVAIR) 250-50 MCG/DOSE AEPB Inhale 1 puff into the lungs 2 (two) times daily. 09/25/14  Yes Noralee Space, MD  furosemide (LASIX) 80 MG tablet Take 2 tablets (160 mg total) by mouth 2 (two) times daily. 02/02/15  Yes Ripudeep Krystal Eaton, MD  insulin lispro (HUMALOG KWIKPEN) 100 UNIT/ML KiwkPen Inject 0.01-0.1 mLs (1-10 Units total) into the skin 3 (three) times daily. Sliding scale CBG 70 - 120: 0 units CBG 121 - 150: 1 unit,  CBG 151 - 200: 2 units,  CBG 201 - 250: 3 units,  CBG 251 - 300: 5 units,  CBG 301 - 350: 7 units,  CBG 351 - 400: 9 units   CBG > 400: 10 units and notify your MD 02/02/15  Yes Ripudeep K Rai, MD  isosorbide-hydrALAZINE (BIDIL) 20-37.5 MG per tablet Take 2 tablets by mouth 3 (three) times daily. Patient taking differently: Take 0.5 tablets by mouth daily.  02/02/15  Yes Ripudeep Krystal Eaton, MD    loperamide (IMODIUM) 2 MG capsule Take 1 capsule (2 mg total) by mouth 2 (two) times daily as needed for diarrhea or loose stools. 02/02/15  Yes Ripudeep Krystal Eaton, MD  metolazone (ZAROXOLYN) 5 MG tablet Take 5 mg by mouth as needed. FOR EDEMA 12/12/14  Yes Historical Provider, MD  Multiple Vitamins-Minerals (MULTIVITAMIN WITH MINERALS) tablet Take 1 tablet by mouth daily.   Yes Historical Provider, MD  ondansetron (ZOFRAN) 4 MG tablet Take 1 tablet (4 mg total) by mouth every 8 (eight) hours as needed for nausea or vomiting. 02/28/15  Yes Amy S Esterwood, PA-C  promethazine (PHENERGAN) 25 MG tablet Take 1 tablet (25 mg total) by mouth every 4 (four) hours as needed for nausea or vomiting. 02/02/15  Yes Ripudeep Krystal Eaton, MD  saccharomyces boulardii (FLORASTOR) 250 MG capsule Take 1 capsule (250 mg total) by mouth 2 (two) times daily. 02/02/15  Yes Ripudeep Krystal Eaton, MD  sodium bicarbonate 650 MG tablet Take 1 tablet (650 mg total) by mouth 2 (two) times daily. 02/02/15  Yes Ripudeep Krystal Eaton, MD  TOUJEO SOLOSTAR 300 UNIT/ML SOPN Inject 40 Units into the skin every morning. 11/21/14  Yes Historical Provider, MD  triamcinolone cream (KENALOG) 0.1 % Apply 1 application topically 2 (two) times daily.   Yes Historical Provider, MD  warfarin (COUMADIN) 1 MG tablet Take 1 tablet (1 mg total) by mouth every evening. Dose to be adjusted Patient taking differently: Take 1.5 mg by mouth every evening. Dose to be adjusted 02/24/15  Yes Sueanne Margarita, MD  albuterol (PROVENTIL HFA;VENTOLIN HFA) 108 (90 BASE) MCG/ACT inhaler Inhale 2 puffs into the lungs every 4 (four) hours as needed for wheezing or shortness of breath.     Historical Provider, MD  albuterol (PROVENTIL) (2.5 MG/3ML) 0.083% nebulizer solution Take 2.5 mg by nebulization every 2 (two) hours as needed for wheezing or shortness of breath.    Historical Provider, MD  hydrocortisone (ANUSOL-HC) 2.5 % rectal cream Place 1 application rectally 4 (four) times daily. 02/28/15   Amy  S Esterwood, PA-C    Current Facility-Administered Medications  Medication Dose Route Frequency Provider Last Rate Last Dose  . acetaminophen (TYLENOL) tablet 1,000 mg  1,000 mg Oral BID PRN Theodis Blaze, MD      . albuterol (PROVENTIL) (2.5 MG/3ML) 0.083% nebulizer solution 2.5 mg  2.5 mg Nebulization Q2H PRN Theodis Blaze, MD      . atorvastatin (LIPITOR) tablet 20 mg  20 mg Oral QHS Theodis Blaze, MD      . carvedilol (COREG) tablet 12.5 mg  12.5 mg Oral BID WC Theodis Blaze, MD      . dicyclomine (BENTYL) capsule 10 mg  10 mg Oral TID AC Theodis Blaze, MD      . famotidine (PEPCID) tablet 20 mg  20 mg Oral Daily Theodis Blaze, MD      . Derrill Memo ON 03/02/2015] ferrous sulfate tablet 325 mg  325 mg Oral Q breakfast Theodis Blaze, MD      . fluticasone (FLONASE) 50 MCG/ACT nasal spray 2 spray  2 spray Each Nare Daily PRN Theodis Blaze, MD      . furosemide (LASIX) tablet 160 mg  160 mg Oral BID Theodis Blaze, MD      . hydrocortisone (ANUSOL-HC) 2.5 % rectal cream 1 application  1 application Rectal QID Theodis Blaze, MD      . insulin aspart (novoLOG) injection 0-9 Units  0-9 Units Subcutaneous TID WC Theodis Blaze, MD      . insulin glargine (LANTUS) injection 40 Units  40 Units Subcutaneous q morning - 10a Theodis Blaze, MD      . isosorbide-hydrALAZINE (BIDIL) 20-37.5 MG per tablet 2 tablet  2 tablet Oral TID Theodis Blaze, MD      . loperamide (IMODIUM) capsule 2 mg  2 mg Oral BID PRN Theodis Blaze, MD      . metolazone (ZAROXOLYN) tablet 5 mg  5 mg Oral Daily Theodis Blaze, MD      . mometasone-formoterol (DULERA) 100-5 MCG/ACT inhaler 2 puff  2 puff Inhalation BID Theodis Blaze, MD      . ondansetron [facility]) tablet 4 mg  4 mg Oral Q6H PRN Theodis Blaze, MD       Or  . ondansetron [facility]) injection 4 mg  4 mg Intravenous Q6H PRN Theodis Blaze, MD      . pantoprazole (PROTONIX) EC tablet 40 mg  40 mg Oral Daily Theodis Blaze, MD      . promethazine (PHENERGAN) tablet 25 mg  25 mg Oral  Q4H PRN Theodis Blaze, MD      . saccharomyces boulardii (FLORASTOR) capsule 250 mg  250 mg Oral BID Theodis Blaze, MD      . sodium bicarbonate tablet 650 mg  650 mg Oral BID Theodis Blaze, MD      . sodium chloride 0.9 % injection 3 mL  3 mL Intravenous Q12H Theodis Blaze, MD        Allergies as of 03/01/2015 - Review Complete 03/01/2015  Allergen Reaction Noted  . Adhesive [tape] Other (See Comments)   . Avelox [moxifloxacin hcl in nacl] Other (See Comments) 04/06/2011  . Codeine Other (See Comments) 09/24/2010  . Guaifenesin Nausea And Vomiting   . Latex Swelling   . Oxycodone Nausea And Vomiting     Family History  Problem Relation Age of Onset  . Diabetes Mother   . Heart disease Mother   . Hyperlipidemia Mother   . Varicose Veins Mother   . Breast cancer Maternal Aunt   . Heart disease Father   . Deep vein thrombosis Father   . Hyperlipidemia Father   . Heart disease Sister     before age 51  . Cancer Sister   . Diabetes Sister   . Hyperlipidemia Sister   . Heart attack Sister   .  Heart disease Brother   . Diabetes Brother   . Hyperlipidemia Brother   . Heart attack Brother   . Hyperlipidemia Sister     History   Social History  . Marital Status: Single    Spouse Name: N/A  . Number of Children: 0  . Years of Education: N/A   Occupational History  . Retired   . SECRETARY    Social History Main Topics  . Smoking status: Never Smoker   . Smokeless tobacco: Never Used  . Alcohol Use: No  . Drug Use: No  . Sexual Activity: Not Currently   Other Topics Concern  . Not on file   Social History Narrative   Lives in [place] alone.  Never married.   Retired Art therapist at [employer]: Review of Systems Pertinent positive and negative review of systems were noted in the above HPI section. All other review of systems was otherwise negative.   Physical Exam: Vital signs in last 24 hours: Temp:  [97.6 F (36.4 C)-98.1 F  (36.7 C)] 97.6 F (36.4 C) (07/30 1114) Pulse Rate:  [55-95] 64 (07/30 1114) Resp:  [18-32] 18 (07/30 1114) BP: (98-213)/(62-148) 188/92 mmHg (07/30 1114) SpO2:  [89 %-100 %] 100 % (07/30 1114) Weight:  [170 lb (77.111 kg)] 170 lb (77.111 kg) (07/29 1442)   General:   Alert,  chronically ill-appearing African-American femal Head:  Normocephalic and atraumatic. Eyes:  Sclera clear, no icterus.  Conjunctiva pink. Ears:  Normal auditory acuity. Nose:  No deformity, discharge,  or lesions. Mouth:  No deformity or lesions.   Neck:  Supple; no masses or thyromegaly. Lungs:  Clear throughout to auscultation.   Heart:  Regular rate and rhythm; no murmurs, clicks, rubs,  or gallops. Abdomen:  Soft, mild diffuse tenderness to palpation, no rebound or guarding, BS active,nonpalp mass or hsm.   Rectal:  Deferred  (rectal exam in the office and had external hemorrhoid which was inflamed and tender and digital rectal exam had brown, heme-negative stool) Msk:  Symmetrical without gross deformities. . Pulses:  Normal pulses noted. Extremities:  Without clubbing or edema. Neurologic: Alert and  oriented x4;  grossly normal neurologically. Skin: Intact without significant lesions or rashes.. Psych:  Alert and cooperative. Normal mood and affect.  Intake/Output from previous day:   Intake/Output this shift: Total I/O In: -  Out: 300 [Urine:200; Stool:100]  Lab Results:  Recent Labs  03/01/15 0445  WBC 10.1  HGB 14.0  HCT 41.4  PLT 809*   BMET  Recent Labs  03/01/15 0445  NA 138  K 3.4*  CL 107  CO2 15*  GLUCOSE 230*  BUN 50*  CREATININE 3.63*  CALCIUM 9.7   LFT  Recent Labs  03/01/15 0445  PROT 6.8  ALBUMIN 2.7*  AST 20  ALT 14  ALKPHOS 156*  BILITOT 0.7   Hepatic function panel 02/24/2015 total protein 6, albumen 2.5, AST 33, ALT 17, alkaline phosphatase 138, total bili 0.6.  IMPRESSION/PLAN:  69 year old female with chronic nausea/vomiting/diarrhea. Has  non-focal abdominal pain. Alkaline phosphatase elevated, AST and ALT normal. Has had cholecystectomy in past. Previous CT nonrevealing and recent GES normal. CT of 12/16/2014 with suggestion of cirrhosis. Current PT/INR pending. ? If nausea related to her progressive renal failure. Continue antiemetics. PPI once daily. Recent stool for C. difficile on 01/24/2015 negative. Would repeat C. Difficile and check stool lactoferrin. Anti-spasmodic (bentyl, levbid) as needed. Clear liquids. Will review with attending as to  possible need for further imaging in patient with CAD/cardiomyopathy/CHF/LV thrombus-? Possible mesenteric ischemia.  Hvozdovic, Deloris Ping 03/01/2015,  Pager [phone]     Attending physician's note   I have taken a history, examined the patient and reviewed the chart. I agree with the Advanced Practitioner's note, impression and recommendations. Chronic nausea, vomiting, diarrhea, abd pain. Symptoms likely secondary to progressive renal failure, mesenteric insufficiency. Recheck for infectious causes of diarrhea. Recommend cardiology evaluation for cardiomyopathy and heart failure that could be causing low flow mesenteric insufficiency.    Ladene Artist, MD Marval Regal [phone] pager Mon-Fri 8a-5p [phone] weekends, holidays and 5p-8a or per [facility]

## 2015-03-01 NOTE — Progress Notes (Signed)
ANTICOAGULATION CONSULT NOTE - Initial Consult  Pharmacy Consult for warfarin Indication: left mural thrombus  Allergies  Allergen Reactions  . Adhesive [Tape] Other (See Comments)    Blisters   . Avelox [Moxifloxacin Hcl In Nacl] Other (See Comments)    GI upset  . Codeine Other (See Comments)    "crazy"   . Guaifenesin Nausea And Vomiting    Takes Mucinex at home without issue  . Latex Swelling  . Oxycodone Nausea And Vomiting    Takes Percocet at home without issue   Vital Signs: Temp: 97.6 F (36.4 C) (07/30 1114) Temp Source: Axillary (07/30 1114) BP: 188/92 mmHg (07/30 1114) Pulse Rate: 64 (07/30 1114)  Labs:  Recent Labs  03/01/15 0445 03/01/15 1154  HGB 14.0  --   HCT 41.4  --   PLT 809*  --   LABPROT  --  25.0*  INR  --  2.29*  CREATININE 3.63*  --     Estimated Creatinine Clearance: 14.7 mL/min (by C-G formula based on Cr of 3.63).   Medical History: Past Medical History  Diagnosis Date  . Essential hypertension, benign   . Coronary atherosclerosis of native coronary artery 01/04/2006    Tiny OM1 70-90% ostial stenosis, no other CAD  . COPD (chronic obstructive pulmonary disease)   . Esophageal reflux   . Dyslipidemia   . Gout   . Colon polyps 2011    Tubular adenomatous polyps  . Spinal stenosis of lumbar region     chronic low back pain.   . Bulging lumbar disc   . Chronic diastolic heart failure   . PNA (pneumonia) 2012    With pleural effusion, requiring thoracentesis  . Chronic bronchitis   . On home oxygen therapy   . OSA (obstructive sleep apnea)     Marginally compliant with CPAP  . Type II diabetes mellitus   . Arthritis   . Diverticulosis   . GERD (gastroesophageal reflux disease)   . Anxiety   . Hyperlipidemia   . Sarcoidosis of lung   . CKD (chronic kidney disease) stage 4, GFR 15-29 ml/min   . IBS (irritable bowel syndrome)   . Cataracts, both eyes   . Headache(784.0)   . Lactic acidosis 01/21/2015  . Chronic CHF  (congestive heart failure)     Medications:  Prescriptions prior to admission  Medication Sig Dispense Refill Last Dose  . acetaminophen (TYLENOL) 500 MG tablet Take 1,000 mg by mouth 2 (two) times daily as needed (pain).    02/28/2015 at Unknown time  . Alpha-D-Galactosidase (BEANO PO) Take 1 tablet by mouth daily.   Past Week at Unknown time  . Apremilast (OTEZLA) 30 MG TABS Take 30 mg by mouth daily.    02/28/2015 at Unknown time  . atorvastatin (LIPITOR) 20 MG tablet Take 20 mg by mouth at bedtime.   02/28/2015 at Unknown time  . carvedilol (COREG) 12.5 MG tablet Take 1 tablet (12.5 mg total) by mouth 2 (two) times daily with a meal. 60 tablet 3 02/28/2015 at 2000  . dicyclomine (BENTYL) 10 MG capsule Take 1 tab twice daily. 60 capsule 0 02/28/2015 at Unknown time  . esomeprazole (NEXIUM) 40 MG capsule Take 1 capsule once daily in the morning. 60 capsule 3 02/28/2015 at Unknown time  . famotidine (PEPCID) 20 MG tablet Take 1 tablet (20 mg total) by mouth daily. 30 tablet 3 02/28/2015 at Unknown time  . ferrous sulfate 325 (65 FE) MG tablet Take 325 mg by  mouth daily with breakfast.   02/28/2015 at Unknown time  . fluticasone (FLONASE) 50 MCG/ACT nasal spray Place 2 sprays into both nostrils daily as needed for allergies or rhinitis.    Past Week at Unknown time  . Fluticasone-Salmeterol (ADVAIR) 250-50 MCG/DOSE AEPB Inhale 1 puff into the lungs 2 (two) times daily. 180 each 3 Past Week at Unknown time  . furosemide (LASIX) 80 MG tablet Take 2 tablets (160 mg total) by mouth 2 (two) times daily. 120 tablet 2 02/28/2015 at Unknown time  . insulin lispro (HUMALOG KWIKPEN) 100 UNIT/ML KiwkPen Inject 0.01-0.1 mLs (1-10 Units total) into the skin 3 (three) times daily. Sliding scale CBG 70 - 120: 0 units CBG 121 - 150: 1 unit,  CBG 151 - 200: 2 units,  CBG 201 - 250: 3 units,  CBG 251 - 300: 5 units,  CBG 301 - 350: 7 units,  CBG 351 - 400: 9 units   CBG > 400: 10 units and notify your MD 15 mL 3 02/28/2015 at  Unknown time  . isosorbide-hydrALAZINE (BIDIL) 20-37.5 MG per tablet Take 2 tablets by mouth 3 (three) times daily. (Patient taking differently: Take 0.5 tablets by mouth daily. ) 90 tablet 3 02/28/2015 at Unknown time  . loperamide (IMODIUM) 2 MG capsule Take 1 capsule (2 mg total) by mouth 2 (two) times daily as needed for diarrhea or loose stools. 60 capsule 0 Past Month at Unknown time  . metolazone (ZAROXOLYN) 5 MG tablet Take 5 mg by mouth as needed. FOR EDEMA  1 Past Month at Unknown time  . Multiple Vitamins-Minerals (MULTIVITAMIN WITH MINERALS) tablet Take 1 tablet by mouth daily.   02/28/2015 at Unknown time  . ondansetron (ZOFRAN) 4 MG tablet Take 1 tablet (4 mg total) by mouth every 8 (eight) hours as needed for nausea or vomiting. 90 tablet 3 02/28/2015 at Unknown time  . promethazine (PHENERGAN) 25 MG tablet Take 1 tablet (25 mg total) by mouth every 4 (four) hours as needed for nausea or vomiting. 30 tablet 0 02/28/2015 at Unknown time  . saccharomyces boulardii (FLORASTOR) 250 MG capsule Take 1 capsule (250 mg total) by mouth 2 (two) times daily. 60 capsule 3 02/28/2015 at Unknown time  . sodium bicarbonate 650 MG tablet Take 1 tablet (650 mg total) by mouth 2 (two) times daily. 60 tablet 3 02/28/2015 at Unknown time  . TOUJEO SOLOSTAR 300 UNIT/ML SOPN Inject 40 Units into the skin every morning.  3 02/28/2015 at Unknown time  . triamcinolone cream (KENALOG) 0.1 % Apply 1 application topically 2 (two) times daily.   Past Week at Unknown time  . warfarin (COUMADIN) 1 MG tablet Take 1 tablet (1 mg total) by mouth every evening. Dose to be adjusted (Patient taking differently: Take 1.5 mg by mouth every evening. Dose to be adjusted) 45 tablet 1 02/28/2015 at Unknown time  . albuterol (PROVENTIL HFA;VENTOLIN HFA) 108 (90 BASE) MCG/ACT inhaler Inhale 2 puffs into the lungs every 4 (four) hours as needed for wheezing or shortness of breath.    Not used recently  . albuterol (PROVENTIL) (2.5 MG/3ML)  0.083% nebulizer solution Take 2.5 mg by nebulization every 2 (two) hours as needed for wheezing or shortness of breath.   Not used recently  . hydrocortisone (ANUSOL-HC) 2.5 % rectal cream Place 1 application rectally 4 (four) times daily. 30 g 0 Not yet used    Assessment: 69 year old woman on warfarin prior to admission for left mural thrombus to  continue on warfarin while hospitalized.  Dose is 1.5mg  po daily.  INR on admission is therapeutic at 2.29. Goal of Therapy:  INR 2-3  Plan:  Continue home dose of 1.5mg  PO daily Daily INR.  Candie Mile 03/01/2015,1:51 PM

## 2015-03-01 NOTE — ED Provider Notes (Signed)
CSN: [id number]     Arrival date & time 03/01/15  0427 History   First MD Initiated Contact with Patient 03/01/15 [phone]     Chief Complaint  Patient presents with  . Abdominal Pain     (Consider location/radiation/quality/duration/timing/severity/associated sxs/prior Treatment) HPI Comments: Patient is a 69 year old female with a past medical history of hypertension, CAD, anemia, diabetes, CKD, and COPD who presents with abdominal pain that started this evening. The pain is located in her epigastrium and does not radiate. The pain is described as aching and severe. The pain started gradually and progressively worsened since the onset. No alleviating/aggravating factors. The patient has tried nothing for symptoms without relief. Associated symptoms include nausea. Patient denies fever, headache, vomiting, diarrhea, chest pain, SOB, dysuria, constipation. Patient was seen by her GI doctor today and was prescribed zofran and phenergan. Her stool was cultured for c. Diff. Patient has been under evaluation for ongoing symptoms for the past 3 months but her pain worsening tonight.    Past Medical History  Diagnosis Date  . Essential hypertension, benign   . Coronary atherosclerosis of native coronary artery 01/04/2006    Tiny OM1 70-90% ostial stenosis, no other CAD  . COPD (chronic obstructive pulmonary disease)   . Esophageal reflux   . Dyslipidemia   . Gout   . Colon polyps 2011    Tubular adenomatous polyps  . Spinal stenosis of lumbar region     chronic low back pain.   . Bulging lumbar disc   . Chronic diastolic heart failure   . PNA (pneumonia) 2012    With pleural effusion, requiring thoracentesis  . Chronic bronchitis   . On home oxygen therapy   . OSA (obstructive sleep apnea)     Marginally compliant with CPAP  . Type II diabetes mellitus   . Arthritis   . Diverticulosis   . GERD (gastroesophageal reflux disease)   . Anxiety   . Hyperlipidemia   . Sarcoidosis of lung   .  CKD (chronic kidney disease) stage 4, GFR 15-29 ml/min   . IBS (irritable bowel syndrome)   . Cataracts, both eyes   . Headache(784.0)   . Lactic acidosis 01/21/2015  . Chronic CHF (congestive heart failure)    Past Surgical History  Procedure Laterality Date  . Vesicovaginal fistula closure w/  total abdominal hysterectomy  1992  . Bilateral total knee replacements Bilateral Rt=5/04 & Lft=1/09    by DrAlusio  . 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 Zella Richer  . Appendectomy  1957  . Tonsillectomy  1968  . Cholecystectomy  1980's  . Abdominal hysterectomy  1992  . Reduction mammaplasty Bilateral 1980's  . Cardiac catheterization  1990's  . Thoracentesis  2012  . Joint replacement    . Av fistula placement Left 12/31/2013    Procedure: ARTERIOVENOUS (AV) FISTULA CREATION;  Surgeon: Elam Dutch, MD;  Location: [facility];  Service: Vascular;  Laterality: Left;  . Colonoscopy w/ biopsies and polypectomy    . Av fistula placement Left 03/18/2014    Procedure: ARTERIOVENOUS (AV) FISTULA CREATION- LEFT BRACHIOCEPHALIC ;  Surgeon: Elam Dutch, MD;  Location: [facility];  Service: Vascular;  Laterality: Left;  . Ligation of arteriovenous  fistula Left 03/18/2014    Procedure: LIGATION OF ARTERIOVENOUS  FISTULA- LEFT RADIOCEPHALIC;  Surgeon: Elam Dutch, MD;  Location: [facility];  Service: Vascular;  Laterality: Left;  . Cardiac catheterization N/A 01/27/2015    Procedure: Right Heart  Cath;  Surgeon: Larey Dresser, MD;  Location: [facility];  Service: Cardiovascular;  Laterality: N/A;   Family History  Problem Relation Age of Onset  . Diabetes Mother   . Heart disease Mother   . Hyperlipidemia Mother   . Varicose Veins Mother   . Breast cancer Maternal Aunt   . Heart disease Father   . Deep vein thrombosis Father   . Hyperlipidemia Father   . Heart disease Sister     before age 44  . Cancer Sister   . Diabetes Sister   . Hyperlipidemia Sister   . Heart attack Sister   .  Heart disease Brother   . Diabetes Brother   . Hyperlipidemia Brother   . Heart attack Brother   . Hyperlipidemia Sister    History  Substance Use Topics  . Smoking status: Never Smoker   . Smokeless tobacco: Never Used  . Alcohol Use: No   OB History    No data available     Review of Systems  Constitutional: Negative for fever, chills and fatigue.  HENT: Negative for trouble swallowing.   Eyes: Negative for visual disturbance.  Respiratory: Negative for shortness of breath.   Cardiovascular: Negative for chest pain and palpitations.  Gastrointestinal: Positive for vomiting, abdominal pain and diarrhea. Negative for nausea.  Genitourinary: Negative for dysuria and difficulty urinating.  Musculoskeletal: Negative for arthralgias and neck pain.  Skin: Negative for color change.  Neurological: Negative for dizziness and weakness.  Psychiatric/Behavioral: Negative for dysphoric mood.      Allergies  Adhesive; Avelox; Codeine; Guaifenesin; Latex; and Oxycodone  Home Medications   Prior to Admission medications   Medication Sig Start Date End Date Taking? Authorizing Provider  acetaminophen (TYLENOL) 500 MG tablet Take 1,000 mg by mouth 2 (two) times daily as needed (pain).     Historical Provider, MD  albuterol (PROVENTIL HFA;VENTOLIN HFA) 108 (90 BASE) MCG/ACT inhaler Inhale 2 puffs into the lungs every 4 (four) hours as needed for wheezing or shortness of breath.     Historical Provider, MD  albuterol (PROVENTIL) (2.5 MG/3ML) 0.083% nebulizer solution Take 2.5 mg by nebulization every 2 (two) hours as needed for wheezing or shortness of breath.    Historical Provider, MD  Alpha-D-Galactosidase (BEANO PO) Take 1 tablet by mouth daily.    Historical Provider, MD  Apremilast (OTEZLA) 30 MG TABS Take 30 mg by mouth daily.     Historical Provider, MD  atorvastatin (LIPITOR) 20 MG tablet Take 20 mg by mouth at bedtime.    Historical Provider, MD  carvedilol (COREG) 12.5 MG tablet  Take 1 tablet (12.5 mg total) by mouth 2 (two) times daily with a meal. 02/02/15   Ripudeep K Rai, MD  dicyclomine (BENTYL) 10 MG capsule Take 1 tab twice daily. 02/28/15   Amy S Esterwood, PA-C  esomeprazole (NEXIUM) 40 MG capsule Take 1 capsule once daily in the morning. 02/28/15   Amy S Esterwood, PA-C  famotidine (PEPCID) 20 MG tablet Take 1 tablet (20 mg total) by mouth daily. 02/02/15   Ripudeep Krystal Eaton, MD  ferrous sulfate 325 (65 FE) MG tablet Take 325 mg by mouth daily with breakfast.    Historical Provider, MD  fluticasone (FLONASE) 50 MCG/ACT nasal spray Place 2 sprays into both nostrils daily as needed for allergies or rhinitis.     Historical Provider, MD  Fluticasone-Salmeterol (ADVAIR) 250-50 MCG/DOSE AEPB Inhale 1 puff into the lungs 2 (two) times daily. 09/25/14  Noralee Space, MD  furosemide (LASIX) 80 MG tablet Take 2 tablets (160 mg total) by mouth 2 (two) times daily. 02/02/15   Ripudeep Krystal Eaton, MD  hydrocortisone (ANUSOL-HC) 2.5 % rectal cream Place 1 application rectally 4 (four) times daily. 02/28/15   Amy S Esterwood, PA-C  insulin lispro (HUMALOG KWIKPEN) 100 UNIT/ML KiwkPen Inject 0.01-0.1 mLs (1-10 Units total) into the skin 3 (three) times daily. Sliding scale CBG 70 - 120: 0 units CBG 121 - 150: 1 unit,  CBG 151 - 200: 2 units,  CBG 201 - 250: 3 units,  CBG 251 - 300: 5 units,  CBG 301 - 350: 7 units,  CBG 351 - 400: 9 units   CBG > 400: 10 units and notify your MD 02/02/15   Ripudeep Krystal Eaton, MD  isosorbide-hydrALAZINE (BIDIL) 20-37.5 MG per tablet Take 2 tablets by mouth 3 (three) times daily. 02/02/15   Ripudeep Krystal Eaton, MD  loperamide (IMODIUM) 2 MG capsule Take 1 capsule (2 mg total) by mouth 2 (two) times daily as needed for diarrhea or loose stools. 02/02/15   Ripudeep Krystal Eaton, MD  metolazone (ZAROXOLYN) 5 MG tablet Take 5 mg by mouth as needed. FOR EDEMA 12/12/14   Historical Provider, MD  Multiple Vitamins-Minerals (MULTIVITAMIN WITH MINERALS) tablet Take 1 tablet by mouth daily.     Historical Provider, MD  ondansetron (ZOFRAN) 4 MG tablet Take 1 tablet (4 mg total) by mouth every 8 (eight) hours as needed for nausea or vomiting. 02/28/15   Amy S Esterwood, PA-C  promethazine (PHENERGAN) 25 MG tablet Take 1 tablet (25 mg total) by mouth every 4 (four) hours as needed for nausea or vomiting. 02/02/15   Ripudeep Krystal Eaton, MD  saccharomyces boulardii (FLORASTOR) 250 MG capsule Take 1 capsule (250 mg total) by mouth 2 (two) times daily. 02/02/15   Ripudeep Krystal Eaton, MD  sodium bicarbonate 650 MG tablet Take 1 tablet (650 mg total) by mouth 2 (two) times daily. 02/02/15   Ripudeep Krystal Eaton, MD  TOUJEO SOLOSTAR 300 UNIT/ML SOPN Inject 40 Units into the skin every morning. 11/21/14   Historical Provider, MD  triamcinolone cream (KENALOG) 0.1 % Apply 1 application topically 2 (two) times daily.    Historical Provider, MD  warfarin (COUMADIN) 1 MG tablet Take 1 tablet (1 mg total) by mouth every evening. Dose to be adjusted Patient taking differently: Take 1.5 mg by mouth every evening. Dose to be adjusted 02/24/15   Sueanne Margarita, MD   BP 213/124 mmHg  Pulse 72  Temp(Src) 98.1 F (36.7 C) (Axillary)  Resp 29  SpO2 100% Physical Exam  Constitutional: She is oriented to person, place, and time. She appears well-developed and well-nourished. No distress.  HENT:  Head: Normocephalic and atraumatic.  Eyes: Conjunctivae and EOM are normal.  Neck: Normal range of motion.  Cardiovascular: Normal rate and regular rhythm.  Exam reveals no gallop and no friction rub.   No murmur heard. Pulmonary/Chest: Effort normal and breath sounds normal. She has no wheezes. She has no rales. She exhibits no tenderness.  Abdominal: Soft. She exhibits no distension. There is no tenderness. There is no rebound.  Musculoskeletal: Normal range of motion.  Neurological: She is alert and oriented to person, place, and time. Coordination normal.  Speech is goal-oriented. Moves limbs without ataxia.   Skin: Skin is warm  and dry.  Psychiatric: She has a normal mood and affect. Her behavior is normal.  Nursing note and vitals reviewed.  ED Course  Procedures (including critical care time) Labs Review Labs Reviewed  COMPREHENSIVE METABOLIC PANEL - Abnormal; Notable for the following:    Potassium 3.4 (*)    CO2 15 (*)    Glucose, Bld 230 (*)    BUN 50 (*)    Creatinine, Ser 3.63 (*)    Albumin 2.7 (*)    Alkaline Phosphatase 156 (*)    GFR calc non Af Amer 12 (*)    GFR calc Af Amer 14 (*)    Anion gap 16 (*)    All other components within normal limits  CBC - Abnormal; Notable for the following:    RDW 16.7 (*)    Platelets 809 (*)    All other components within normal limits  URINALYSIS, ROUTINE W REFLEX MICROSCOPIC (NOT AT [facility]) - Abnormal; Notable for the following:    Glucose, UA 250 (*)    Hgb urine dipstick SMALL (*)    Ketones, ur 15 (*)    Protein, ur >300 (*)    All other components within normal limits  PROTIME-INR - Abnormal; Notable for the following:    Prothrombin Time 25.0 (*)    INR 2.29 (*)    All other components within normal limits  MAGNESIUM - Abnormal; Notable for the following:    Magnesium 1.6 (*)    All other components within normal limits  GLUCOSE, CAPILLARY - Abnormal; Notable for the following:    Glucose-Capillary 214 (*)    All other components within normal limits  GLUCOSE, CAPILLARY - Abnormal; Notable for the following:    Glucose-Capillary 186 (*)    All other components within normal limits  CBG MONITORING, ED - Abnormal; Notable for the following:    Glucose-Capillary 211 (*)    All other components within normal limits  LIPASE, BLOOD  URINE MICROSCOPIC-ADD ON  PHOSPHORUS  TSH  URINE RAPID DRUG SCREEN, HOSP PERFORMED  CBC  RENAL FUNCTION PANEL  PROTIME-INR  I-STAT CG4 LACTIC ACID, ED  I-STAT CG4 LACTIC ACID, ED    Imaging Review No results found.   EKG Interpretation None      MDM   Final diagnoses:  Intractable vomiting with  nausea, vomiting of unspecified type    4:49 AM  Labs and urinalysis pending. Vitals stable and patient afebrile.   6:14 AM Patient will have IV phenergan. Urinalysis pending. Patient will have PO challenge after phenergan. If pain and nausea unable to be controlled, patient will be admitted. Otherwise, patient will follow up with nephrologist.   Alvina Chou, PA-C 03/01/15 1936  Merryl Hacker, MD 03/02/15 [phone]

## 2015-03-01 NOTE — ED Notes (Signed)
Attempted report 

## 2015-03-01 NOTE — ED Notes (Signed)
Pt arrives via EMS from home with upper abdominal pain with diarrhea and vomiting ongoing for 3 months. Seen by GI today and given phenergan and zofran RX. States no difference in pain to previous s/s. Requesting nausea and pain meds from EMS. EMS gave 4MG  zofran IM.

## 2015-03-01 NOTE — ED Notes (Addendum)
RN told this RN in report that pt's BP was high and MD was aware.  RN Beckie Busing stated that pt presure was up due to moving around and anxiety.  MD was not going to order anything at this time.

## 2015-03-01 NOTE — ED Provider Notes (Signed)
Patient signed out to me by Alvina Chou, PA-C with plan to follow-up on patient's symptoms, discharge if patient's symptoms improving and tolerating by mouth well.  8:00 AM: Patient continues to vomit, has attempted by mouth challenge and failed. With patient's symptoms ongoing for several days, recently seen by gastroenterology for the same symptoms and failed outpatient therapy with anti-medics at home, will admit patient for intractable nausea and vomiting to medicine. Spoke with Dr. Doyle Askew with [facility] hospitalist who agrees to admit. Spoke with Dr. Fuller Plan with gastroenterology who agrees to look our GI consult on patient during admission.  The patient appears reasonably stabilized for admission considering the current resources, flow, and capabilities available in the ED at this time, and I doubt any other [facility] requiring further screening and/or treatment in the ED prior to admission.  Signed,  Dahlia Bailiff, PA-C 4:50 PM   Dahlia Bailiff, PA-C 03/01/15 1650  Merryl Hacker, MD 03/02/15 [phone]

## 2015-03-01 NOTE — Progress Notes (Signed)
Pt admitted to the unit at 1114. Pt mental status is alert and oriented. Pt oriented to room, staff, and call bell. Skin is intact. Full assessment charted in [facility]. Call bell within reach. Visitor guidelines reviewed w/ pt.

## 2015-03-02 DIAGNOSIS — J9620 Acute and chronic respiratory failure, unspecified whether with hypoxia or hypercapnia: Secondary | ICD-10-CM

## 2015-03-02 DIAGNOSIS — R55 Syncope and collapse: Secondary | ICD-10-CM

## 2015-03-02 DIAGNOSIS — G43A0 Cyclical vomiting, not intractable: Secondary | ICD-10-CM

## 2015-03-02 LAB — RENAL FUNCTION PANEL
ANION GAP: 10 (ref 5–15)
Albumin: 2.4 g/dL — ABNORMAL LOW (ref 3.5–5.0)
BUN: 48 mg/dL — AB (ref 6–20)
CALCIUM: 9 mg/dL (ref 8.9–10.3)
CO2: 20 mmol/L — ABNORMAL LOW (ref 22–32)
Chloride: 108 mmol/L (ref 101–111)
Creatinine, Ser: 3.7 mg/dL — ABNORMAL HIGH (ref 0.44–1.00)
GFR calc Af Amer: 14 mL/min — ABNORMAL LOW (ref >60)
GFR calc non Af Amer: 12 mL/min — ABNORMAL LOW (ref >60)
GLUCOSE: 102 mg/dL — AB (ref 65–99)
Phosphorus: 6.2 mg/dL — ABNORMAL HIGH (ref 2.5–4.6)
Potassium: 3.1 mmol/L — ABNORMAL LOW (ref 3.5–5.1)
SODIUM: 138 mmol/L (ref 135–145)

## 2015-03-02 LAB — CBC
HCT: 43 % (ref 36.0–46.0)
Hemoglobin: 13.8 g/dL (ref 12.0–15.0)
MCH: 28.3 pg (ref 26.0–34.0)
MCHC: 32.1 g/dL (ref 30.0–36.0)
MCV: 88.1 fL (ref 78.0–100.0)
PLATELETS: 712 10*3/uL — AB (ref 150–400)
RBC: 4.88 MIL/uL (ref 3.87–5.11)
RDW: 17.4 % — ABNORMAL HIGH (ref 11.5–15.5)
WBC: 9.6 10*3/uL (ref 4.0–10.5)

## 2015-03-02 LAB — GLUCOSE, CAPILLARY
GLUCOSE-CAPILLARY: 91 mg/dL (ref 65–99)
GLUCOSE-CAPILLARY: 121 mg/dL — AB (ref 65–99)
GLUCOSE-CAPILLARY: 80 mg/dL (ref 65–99)
Glucose-Capillary: 78 mg/dL (ref 65–99)

## 2015-03-02 LAB — PROTIME-INR
INR: 2.99 — AB (ref 0.00–1.49)
Prothrombin Time: 30.5 seconds — ABNORMAL HIGH (ref 11.6–15.2)

## 2015-03-02 LAB — MRSA PCR SCREENING: MRSA by PCR: NEGATIVE

## 2015-03-02 MED ORDER — POTASSIUM CHLORIDE CRYS ER 20 MEQ PO TBCR
40.0000 meq | EXTENDED_RELEASE_TABLET | Freq: Once | ORAL | Status: AC
  Administered 2015-03-02: 40 meq via ORAL

## 2015-03-02 MED ORDER — AMIODARONE LOAD VIA INFUSION
150.0000 mg | Freq: Once | INTRAVENOUS | Status: DC
  Filled 2015-03-02: qty 83.34

## 2015-03-02 MED ORDER — IPRATROPIUM-ALBUTEROL 0.5-2.5 (3) MG/3ML IN SOLN
3.0000 mL | Freq: Three times a day (TID) | RESPIRATORY_TRACT | Status: DC
  Administered 2015-03-02 (×2): 3 mL via RESPIRATORY_TRACT
  Filled 2015-03-02 (×2): qty 3

## 2015-03-02 MED ORDER — POTASSIUM CHLORIDE 10 MEQ/100ML IV SOLN
10.0000 meq | INTRAVENOUS | Status: DC
  Filled 2015-03-02 (×5): qty 100

## 2015-03-02 MED ORDER — POTASSIUM CHLORIDE CRYS ER 20 MEQ PO TBCR
40.0000 meq | EXTENDED_RELEASE_TABLET | Freq: Once | ORAL | Status: AC
  Administered 2015-03-02: 40 meq via ORAL
  Filled 2015-03-02: qty 2

## 2015-03-02 MED ORDER — POTASSIUM CHLORIDE 20 MEQ/15ML (10%) PO SOLN
40.0000 meq | Freq: Once | ORAL | Status: DC
  Filled 2015-03-02: qty 30

## 2015-03-02 MED ORDER — PROMETHAZINE HCL 25 MG/ML IJ SOLN: 12.5000 mg | Freq: Four times a day (QID) | INTRAMUSCULAR | Status: DC | PRN

## 2015-03-02 MED ORDER — MAGNESIUM SULFATE 2 GM/50ML IV SOLN
2.0000 g | Freq: Once | INTRAVENOUS | Status: AC
  Administered 2015-03-02: 2 g via INTRAVENOUS
  Filled 2015-03-02: qty 50

## 2015-03-02 MED ORDER — AMIODARONE HCL IN DEXTROSE 360-4.14 MG/200ML-% IV SOLN
30.0000 mg/h | INTRAVENOUS | Status: DC
  Filled 2015-03-02: qty 200

## 2015-03-02 MED ORDER — AMIODARONE HCL IN DEXTROSE 360-4.14 MG/200ML-% IV SOLN
60.0000 mg/h | INTRAVENOUS | Status: DC
  Filled 2015-03-02: qty 200

## 2015-03-02 NOTE — Consult Note (Signed)
Patient ID: [name] MRN: [id number], DOB/AGE: 11/02/1945   Admit date: 03/01/2015   Primary Physician: Antony Blackbird, MD Primary Cardiologist: Dr. Radford Pax  Pt. Profile:  69 y/o female, followed by Dr. Radford Pax, with a history of chronic combined systolic + diastolic CHF, cardiomyopathy with EF of 30-35%, HTN, DM, CKD IV (baseline SCr b/w 3-4), OHS/OSA and sarcoidosis followed by Dr Lenna Gilford. Admitted for abdominal pain. ? Low flow mesenteric insufficiency. Also with 2 syncopal episodes earlier today.   Problem List  Past Medical History  Diagnosis Date  . Essential hypertension, benign   . Coronary atherosclerosis of native coronary artery 01/04/2006    Tiny OM1 70-90% ostial stenosis, no other CAD  . COPD (chronic obstructive pulmonary disease)   . Esophageal reflux   . Dyslipidemia   . Gout   . Colon polyps 2011    Tubular adenomatous polyps  . Spinal stenosis of lumbar region     chronic low back pain.   . Bulging lumbar disc   . Chronic diastolic heart failure   . PNA (pneumonia) 2012    With pleural effusion, requiring thoracentesis  . Chronic bronchitis   . On home oxygen therapy   . OSA (obstructive sleep apnea)     Marginally compliant with CPAP  . Type II diabetes mellitus   . Arthritis   . Diverticulosis   . GERD (gastroesophageal reflux disease)   . Anxiety   . Hyperlipidemia   . Sarcoidosis of lung   . CKD (chronic kidney disease) stage 4, GFR 15-29 ml/min   . IBS (irritable bowel syndrome)   . Cataracts, both eyes   . Headache(784.0)   . Lactic acidosis 01/21/2015  . Chronic CHF (congestive heart failure)     Past Surgical History  Procedure Laterality Date  . Vesicovaginal fistula closure w/  total abdominal hysterectomy  1992  . Bilateral total knee replacements Bilateral Rt=5/04 & Lft=1/09    by DrAlusio  . 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 Zella Richer  . Appendectomy  1957  . Tonsillectomy  1968  . Cholecystectomy  1980's  . Abdominal  hysterectomy  1992  . Reduction mammaplasty Bilateral 1980's  . Cardiac catheterization  1990's  . Thoracentesis  2012  . Joint replacement    . Av fistula placement Left 12/31/2013    Procedure: ARTERIOVENOUS (AV) FISTULA CREATION;  Surgeon: Elam Dutch, MD;  Location: [facility];  Service: Vascular;  Laterality: Left;  . Colonoscopy w/ biopsies and polypectomy    . Av fistula placement Left 03/18/2014    Procedure: ARTERIOVENOUS (AV) FISTULA CREATION- LEFT BRACHIOCEPHALIC ;  Surgeon: Elam Dutch, MD;  Location: [facility] OR;  Service: Vascular;  Laterality: Left;  . Ligation of arteriovenous  fistula Left 03/18/2014    Procedure: LIGATION OF ARTERIOVENOUS  FISTULA- LEFT RADIOCEPHALIC;  Surgeon: Elam Dutch, MD;  Location: [facility];  Service: Vascular;  Laterality: Left;  . Cardiac catheterization N/A 01/27/2015    Procedure: Right Heart Cath;  Surgeon: Larey Dresser, MD;  Location: [facility];  Service: Cardiovascular;  Laterality: N/A;     Allergies  Allergies  Allergen Reactions  . Adhesive [Tape] Other (See Comments)    Blisters   . Avelox [Moxifloxacin Hcl In Nacl] Other (See Comments)    GI upset  . Codeine Other (See Comments)    "crazy"   . Guaifenesin Nausea And Vomiting    Takes Mucinex at home without issue  . Latex  Swelling  . Oxycodone Nausea And Vomiting    Takes Percocet at home without issue    HPI  69 y/o female, followed by Dr. Radford Pax, with a history of chronic combined systolic + diastolic CHF, HTN, DM, CKD IV (baseline SCr b/w 3-4), OHS/OSA and sarcoidosis followed by Dr Lenna Gilford. Her systolic CHF/ cardiomyopathy was recently diagnosed in June of this year when she present with acute exacerbation. 2D echo showed new reduction in EF to 30-35% with apical hypokinesis and an apical thrombus and moderate RV dysfunction with PASP 68. She was placed on Coumadin for her LV thrombus. Myoview showed no ischemia. She had a RHC showing moderate pulmonary hypertension  and RV>LV failure. She is now followed in the [facility] clinic.She has also been followed recently by GI due to severe abdominal pain for which she has had recent admissions. She was thought to have infectious colitis versus possible mesenteric cardioembolism.  She presented back to the [facility] ED yesterday with complaints of recurrent abdominal pain, n/v/d. She was seen by GI earlier today and they suspect that her cardiomyopathy and HF may be causing low flow mesenteric insufficiency, which may be contributing to her symptoms. Cardiology was asked to evaluate. Prior to our initial assessment, she was noted to have 2 episodes of syncope. Telemetry was concerning for VT. Internal medicine has requested that we give recommendations for this as well.   The patient reports that she felt dizzy and lightheaded prior to her blacking out unexpectedly today while in her hospital bed. She denies any associated CP, dyspnea or palpitations.   She continues to significant abdominal pain. Worse after eating.    Home Medications  Prior to Admission medications   Medication Sig Start Date End Date Taking? Authorizing Provider  acetaminophen (TYLENOL) 500 MG tablet Take 1,000 mg by mouth 2 (two) times daily as needed (pain).    Yes Historical Provider, MD  Alpha-D-Galactosidase (BEANO PO) Take 1 tablet by mouth daily.   Yes Historical Provider, MD  Apremilast (OTEZLA) 30 MG TABS Take 30 mg by mouth daily.    Yes Historical Provider, MD  atorvastatin (LIPITOR) 20 MG tablet Take 20 mg by mouth at bedtime.   Yes Historical Provider, MD  carvedilol (COREG) 12.5 MG tablet Take 1 tablet (12.5 mg total) by mouth 2 (two) times daily with a meal. 02/02/15  Yes Ripudeep K Rai, MD  dicyclomine (BENTYL) 10 MG capsule Take 1 tab twice daily. 02/28/15  Yes Amy S Esterwood, PA-C  esomeprazole (NEXIUM) 40 MG capsule Take 1 capsule once daily in the morning. 02/28/15  Yes Amy S Esterwood, PA-C  famotidine (PEPCID) 20 MG tablet Take 1  tablet (20 mg total) by mouth daily. 02/02/15  Yes Ripudeep Krystal Eaton, MD  ferrous sulfate 325 (65 FE) MG tablet Take 325 mg by mouth daily with breakfast.   Yes Historical Provider, MD  fluticasone (FLONASE) 50 MCG/ACT nasal spray Place 2 sprays into both nostrils daily as needed for allergies or rhinitis.    Yes Historical Provider, MD  Fluticasone-Salmeterol (ADVAIR) 250-50 MCG/DOSE AEPB Inhale 1 puff into the lungs 2 (two) times daily. 09/25/14  Yes Noralee Space, MD  furosemide (LASIX) 80 MG tablet Take 2 tablets (160 mg total) by mouth 2 (two) times daily. 02/02/15  Yes Ripudeep Krystal Eaton, MD  insulin lispro (HUMALOG KWIKPEN) 100 UNIT/ML KiwkPen Inject 0.01-0.1 mLs (1-10 Units total) into the skin 3 (three) times daily. Sliding scale CBG 70 - 120: 0 units CBG 121 -  150: 1 unit,  CBG 151 - 200: 2 units,  CBG 201 - 250: 3 units,  CBG 251 - 300: 5 units,  CBG 301 - 350: 7 units,  CBG 351 - 400: 9 units   CBG > 400: 10 units and notify your MD 02/02/15  Yes Ripudeep K Rai, MD  isosorbide-hydrALAZINE (BIDIL) 20-37.5 MG per tablet Take 2 tablets by mouth 3 (three) times daily. Patient taking differently: Take 0.5 tablets by mouth daily.  02/02/15  Yes Ripudeep Krystal Eaton, MD  loperamide (IMODIUM) 2 MG capsule Take 1 capsule (2 mg total) by mouth 2 (two) times daily as needed for diarrhea or loose stools. 02/02/15  Yes Ripudeep Krystal Eaton, MD  metolazone (ZAROXOLYN) 5 MG tablet Take 5 mg by mouth as needed. FOR EDEMA 12/12/14  Yes Historical Provider, MD  Multiple Vitamins-Minerals (MULTIVITAMIN WITH MINERALS) tablet Take 1 tablet by mouth daily.   Yes Historical Provider, MD  ondansetron (ZOFRAN) 4 MG tablet Take 1 tablet (4 mg total) by mouth every 8 (eight) hours as needed for nausea or vomiting. 02/28/15  Yes Amy S Esterwood, PA-C  promethazine (PHENERGAN) 25 MG tablet Take 1 tablet (25 mg total) by mouth every 4 (four) hours as needed for nausea or vomiting. 02/02/15  Yes Ripudeep Krystal Eaton, MD  saccharomyces boulardii (FLORASTOR) 250  MG capsule Take 1 capsule (250 mg total) by mouth 2 (two) times daily. 02/02/15  Yes Ripudeep Krystal Eaton, MD  sodium bicarbonate 650 MG tablet Take 1 tablet (650 mg total) by mouth 2 (two) times daily. 02/02/15  Yes Ripudeep Krystal Eaton, MD  TOUJEO SOLOSTAR 300 UNIT/ML SOPN Inject 40 Units into the skin every morning. 11/21/14  Yes Historical Provider, MD  triamcinolone cream (KENALOG) 0.1 % Apply 1 application topically 2 (two) times daily.   Yes Historical Provider, MD  warfarin (COUMADIN) 1 MG tablet Take 1 tablet (1 mg total) by mouth every evening. Dose to be adjusted Patient taking differently: Take 1.5 mg by mouth every evening. Dose to be adjusted 02/24/15  Yes Sueanne Margarita, MD  albuterol (PROVENTIL HFA;VENTOLIN HFA) 108 (90 BASE) MCG/ACT inhaler Inhale 2 puffs into the lungs every 4 (four) hours as needed for wheezing or shortness of breath.     Historical Provider, MD  albuterol (PROVENTIL) (2.5 MG/3ML) 0.083% nebulizer solution Take 2.5 mg by nebulization every 2 (two) hours as needed for wheezing or shortness of breath.    Historical Provider, MD  hydrocortisone (ANUSOL-HC) 2.5 % rectal cream Place 1 application rectally 4 (four) times daily. 02/28/15   Amy Genia Harold, PA-C    Family History  Family History  Problem Relation Age of Onset  . Diabetes Mother   . Heart disease Mother   . Hyperlipidemia Mother   . Varicose Veins Mother   . Breast cancer Maternal Aunt   . Heart disease Father   . Deep vein thrombosis Father   . Hyperlipidemia Father   . Heart disease Sister     before age 68  . Cancer Sister   . Diabetes Sister   . Hyperlipidemia Sister   . Heart attack Sister   . Heart disease Brother   . Diabetes Brother   . Hyperlipidemia Brother   . Heart attack Brother   . Hyperlipidemia Sister     Social History  History   Social History  . Marital Status: Single    Spouse Name: N/A  . Number of Children: 0  . Years of Education: N/A  Occupational History  . Retired   .  SECRETARY    Social History Main Topics  . Smoking status: Never Smoker   . Smokeless tobacco: Never Used  . Alcohol Use: No  . Drug Use: No  . Sexual Activity: Not Currently   Other Topics Concern  . Not on file   Social History Narrative   Lives in [place] alone.  Never married.   Retired Art therapist at [employer]:  No chills, fever, night sweats or weight changes.  Cardiovascular:  No chest pain, dyspnea on exertion, edema, orthopnea, palpitations, paroxysmal nocturnal dyspnea. Dermatological: No rash, lesions/masses Respiratory: No cough, dyspnea Urologic: No hematuria, dysuria Abdominal:   No nausea, vomiting, diarrhea, bright red blood per rectum, melena, or hematemesis Neurologic:  No visual changes, wkns, changes in mental status. All other systems reviewed and are otherwise negative except as noted above.  Physical Exam  Blood pressure 118/66, pulse 74, temperature 97.8 F (36.6 C), temperature source Oral, resp. rate 16, weight 171 lb 8.3 oz (77.8 kg), SpO2 96 %.  General: Pleasant, NAD Psych: Normal affect. Neuro: Alert and oriented X 3. Moves all extremities spontaneously. HEENT: Normal  Neck: Supple without bruits or JVD. Lungs:  Resp regular and unlabored, CTA. Heart: RRR no s3, s4, or murmurs. Abdomen: Soft, diffuse tenderness, non-distended, BS + x 4.  Extremities: No clubbing, cyanosis or edema. DP/PT/Radials 2+ and equal bilaterally.  Labs  Troponin (Point of Care Test) No results for input(s): TROPIPOC in the last 72 hours. No results for input(s): CKTOTAL, CKMB, TROPONINI in the last 72 hours. Lab Results  Component Value Date   WBC 9.6 03/02/2015   HGB 13.8 03/02/2015   HCT 43.0 03/02/2015   MCV 88.1 03/02/2015   PLT 712* 03/02/2015    Recent Labs Lab 03/01/15 0445 03/02/15 0733  NA 138 138  K 3.4* 3.1*  CL 107 108  CO2 15* 20*  BUN 50* 48*  CREATININE 3.63* 3.70*  CALCIUM 9.7 9.0  PROT 6.8  --    BILITOT 0.7  --   ALKPHOS 156*  --   ALT 14  --   AST 20  --   GLUCOSE 230* 102*   Lab Results  Component Value Date   CHOL 116 06/24/2014   HDL 36.70* 06/24/2014   LDLCALC 56 06/24/2014   TRIG 116.0 06/24/2014   No results found for: DDIMER   Radiology/Studies  Dg Chest 2 View  02/24/2015   CLINICAL DATA:  Short of breath and weakness  EXAM: CHEST  2 VIEW  COMPARISON:  CT 02/14/2015  FINDINGS: Normal mediastinum and cardiac silhouette. Chronic central bronchitic markings. Normal pulmonary vasculature. No effusion, infiltrate, or pneumothorax.  IMPRESSION: Mild bronchitic markings.  No acute findings.   Electronically Signed   By: Suzy Bouchard M.D.   On: 02/24/2015 17:02   Ct Chest High Resolution  02/14/2015   CLINICAL DATA:  Sarcoid. Off prednisone, on Coumadin. Evaluate for disease progression. Chronic shortness of breath.  EXAM: CT CHEST WITHOUT CONTRAST  TECHNIQUE: Multidetector CT imaging of the chest was performed following the standard protocol without intravenous contrast. High resolution imaging of the lungs, as well as inspiratory and expiratory imaging, was performed.  COMPARISON:  04/28/2014 and 10/04/2010.  FINDINGS: Mediastinum/Nodes: No pathologically enlarged mediastinal or axillary lymph nodes. Hilar regions are difficult to definitively evaluate without IV contrast. Atherosclerotic calcification of the arterial vasculature, including coronary arteries. Heart is mildly enlarged. No pericardial effusion.  Lungs/Pleura: Mild linear scarring and volume loss in both lower lobes, unchanged. No peribronchovascular nodularity. No subpleural reticulation, traction bronchiectasis/ bronchiolectasis, ground-glass, architectural distortion or honeycombing. No air trapping. Lungs are otherwise clear. No pleural fluid. Airway is unremarkable.  Upper abdomen: Partially calcified low-attenuation lesion in the posterior dome of the liver measures 1.9 cm and is unchanged over multiple  prior exams, indicative of a benign lesion. Visualized portions of the liver and stomach are grossly unremarkable. Splenule in the left upper quadrant.  Musculoskeletal: No worrisome lytic or sclerotic lesions.  IMPRESSION: 1. No CT findings indicative of sarcoid. 2. Coronary artery calcification.   Electronically Signed   By: Lorin Picket M.D.   On: 02/14/2015 08:11    ECG  SR with RBBB. Prolonged QT at 576   ASSESSMENT AND PLAN  Principal Problem:   Nausea and vomiting Active Problems:   ABDOMINAL PAIN -GENERALIZED   Acute on chronic respiratory failure   Acute renal failure superimposed on stage 4 chronic kidney disease  1. Syncope: unexplained etiology. There was concern for VT but telemetry strips reviewed by Dr. Harl Bowie and this appears to be artifact. Given prolonged QTc, we advise against use of amiodarone. Discontinue any other potential prolonging QT drugs. Monitor on telemetry. Supplement K and Mg. Agree with transfer to stepdown.   2. Abdominal Pain: GI suspects that her cardiomyopathy and HF may be causing low flow mesenteric insufficiency. EF is 30-35%. Recent RHC showed normal CI. Will ask CHF team to see in the am. Continue Coreg and Bidil.    SignedLyda Jester, PA-C 03/02/2015, 10:31 AM   Patient seen and discussed with PA Rosita Fire, I agree with her documentation. 69 yo female history of OSA, CAD, ICM, LV apical thrombus, chronic systolic HF LVEF 28-78%, admitted with N/V/D. Cardiology is consulted for episode of syncope during admission. Episode occurred around 1007 this AM while patient was laying down with head of bed elevated. Tele by report was concerning for VT, however on review appears to be artifact. Patient will be transferred to stepdown for closer monitoring, continue telemetry. Check electrolytes including K and Mg with goal replacement K to 4 and Mg to 2. Would stop phenergan and zofran due to QTc prolongation. Check orthostatics. Weight early July  around 200-210, wonder if she may be dry. Her PCWP was 16 in late June, around that time her weight was 208 lbs, she has had poor oral intake with N/V/D. Will hold her metolazone. Will need to have EP evaluate given syncope in patient with low LVEF. Will ask CHF to evaluate patient as she is followed by them and there is some question of possible low flow contributing to her abdominal pain, of note she had RHC 01/27/15 with CI of 2.5.   Mg 1.6, TSH 0.9, K 3.4, Cr 3.6 (baseline 3.2-4) EKG SR, RBBB, prolonged QTc 497 in setting of RBBB by manual measurement.   Zandra Abts MD

## 2015-03-02 NOTE — Progress Notes (Addendum)
ANTICOAGULATION CONSULT NOTE - Initial Consult  Pharmacy Consult for warfarin Indication: left mural thrombus  Allergies  Allergen Reactions  . Adhesive [Tape] Other (See Comments)    Blisters   . Avelox [Moxifloxacin Hcl In Nacl] Other (See Comments)    GI upset  . Codeine Other (See Comments)    "crazy"   . Guaifenesin Nausea And Vomiting    Takes Mucinex at home without issue  . Latex Swelling  . Oxycodone Nausea And Vomiting    Takes Percocet at home without issue   Vital Signs: Temp: 97.8 F (36.6 C) (07/31 0543) Temp Source: Oral (07/31 0543) BP: 113/66 mmHg (07/31 0543) Pulse Rate: 70 (07/31 0543)  Labs:  Recent Labs  03/01/15 0445 03/01/15 1154 03/02/15 0733  HGB 14.0  --  13.8  HCT 41.4  --  43.0  PLT 809*  --  712*  LABPROT  --  25.0* 30.5*  INR  --  2.29* 2.99*  CREATININE 3.63*  --  3.70*    Estimated Creatinine Clearance: 14.5 mL/min (by C-G formula based on Cr of 3.7).   Medical History: Past Medical History  Diagnosis Date  . Essential hypertension, benign   . Coronary atherosclerosis of native coronary artery 01/04/2006    Tiny OM1 70-90% ostial stenosis, no other CAD  . COPD (chronic obstructive pulmonary disease)   . Esophageal reflux   . Dyslipidemia   . Gout   . Colon polyps 2011    Tubular adenomatous polyps  . Spinal stenosis of lumbar region     chronic low back pain.   . Bulging lumbar disc   . Chronic diastolic heart failure   . PNA (pneumonia) 2012    With pleural effusion, requiring thoracentesis  . Chronic bronchitis   . On home oxygen therapy   . OSA (obstructive sleep apnea)     Marginally compliant with CPAP  . Type II diabetes mellitus   . Arthritis   . Diverticulosis   . GERD (gastroesophageal reflux disease)   . Anxiety   . Hyperlipidemia   . Sarcoidosis of lung   . CKD (chronic kidney disease) stage 4, GFR 15-29 ml/min   . IBS (irritable bowel syndrome)   . Cataracts, both eyes   . Headache(784.0)   .  Lactic acidosis 01/21/2015  . Chronic CHF (congestive heart failure)     Medications:  Prescriptions prior to admission  Medication Sig Dispense Refill Last Dose  . acetaminophen (TYLENOL) 500 MG tablet Take 1,000 mg by mouth 2 (two) times daily as needed (pain).    02/28/2015 at Unknown time  . Alpha-D-Galactosidase (BEANO PO) Take 1 tablet by mouth daily.   Past Week at Unknown time  . Apremilast (OTEZLA) 30 MG TABS Take 30 mg by mouth daily.    02/28/2015 at Unknown time  . atorvastatin (LIPITOR) 20 MG tablet Take 20 mg by mouth at bedtime.   02/28/2015 at Unknown time  . carvedilol (COREG) 12.5 MG tablet Take 1 tablet (12.5 mg total) by mouth 2 (two) times daily with a meal. 60 tablet 3 02/28/2015 at 2000  . dicyclomine (BENTYL) 10 MG capsule Take 1 tab twice daily. 60 capsule 0 02/28/2015 at Unknown time  . esomeprazole (NEXIUM) 40 MG capsule Take 1 capsule once daily in the morning. 60 capsule 3 02/28/2015 at Unknown time  . famotidine (PEPCID) 20 MG tablet Take 1 tablet (20 mg total) by mouth daily. 30 tablet 3 02/28/2015 at Unknown time  . ferrous sulfate 325 (  65 FE) MG tablet Take 325 mg by mouth daily with breakfast.   02/28/2015 at Unknown time  . fluticasone (FLONASE) 50 MCG/ACT nasal spray Place 2 sprays into both nostrils daily as needed for allergies or rhinitis.    Past Week at Unknown time  . Fluticasone-Salmeterol (ADVAIR) 250-50 MCG/DOSE AEPB Inhale 1 puff into the lungs 2 (two) times daily. 180 each 3 Past Week at Unknown time  . furosemide (LASIX) 80 MG tablet Take 2 tablets (160 mg total) by mouth 2 (two) times daily. 120 tablet 2 02/28/2015 at Unknown time  . insulin lispro (HUMALOG KWIKPEN) 100 UNIT/ML KiwkPen Inject 0.01-0.1 mLs (1-10 Units total) into the skin 3 (three) times daily. Sliding scale CBG 70 - 120: 0 units CBG 121 - 150: 1 unit,  CBG 151 - 200: 2 units,  CBG 201 - 250: 3 units,  CBG 251 - 300: 5 units,  CBG 301 - 350: 7 units,  CBG 351 - 400: 9 units   CBG > 400: 10  units and notify your MD 15 mL 3 02/28/2015 at Unknown time  . isosorbide-hydrALAZINE (BIDIL) 20-37.5 MG per tablet Take 2 tablets by mouth 3 (three) times daily. (Patient taking differently: Take 0.5 tablets by mouth daily. ) 90 tablet 3 02/28/2015 at Unknown time  . loperamide (IMODIUM) 2 MG capsule Take 1 capsule (2 mg total) by mouth 2 (two) times daily as needed for diarrhea or loose stools. 60 capsule 0 Past Month at Unknown time  . metolazone (ZAROXOLYN) 5 MG tablet Take 5 mg by mouth as needed. FOR EDEMA  1 Past Month at Unknown time  . Multiple Vitamins-Minerals (MULTIVITAMIN WITH MINERALS) tablet Take 1 tablet by mouth daily.   02/28/2015 at Unknown time  . ondansetron (ZOFRAN) 4 MG tablet Take 1 tablet (4 mg total) by mouth every 8 (eight) hours as needed for nausea or vomiting. 90 tablet 3 02/28/2015 at Unknown time  . promethazine (PHENERGAN) 25 MG tablet Take 1 tablet (25 mg total) by mouth every 4 (four) hours as needed for nausea or vomiting. 30 tablet 0 02/28/2015 at Unknown time  . saccharomyces boulardii (FLORASTOR) 250 MG capsule Take 1 capsule (250 mg total) by mouth 2 (two) times daily. 60 capsule 3 02/28/2015 at Unknown time  . sodium bicarbonate 650 MG tablet Take 1 tablet (650 mg total) by mouth 2 (two) times daily. 60 tablet 3 02/28/2015 at Unknown time  . TOUJEO SOLOSTAR 300 UNIT/ML SOPN Inject 40 Units into the skin every morning.  3 02/28/2015 at Unknown time  . triamcinolone cream (KENALOG) 0.1 % Apply 1 application topically 2 (two) times daily.   Past Week at Unknown time  . warfarin (COUMADIN) 1 MG tablet Take 1 tablet (1 mg total) by mouth every evening. Dose to be adjusted (Patient taking differently: Take 1.5 mg by mouth every evening. Dose to be adjusted) 45 tablet 1 02/28/2015 at Unknown time  . albuterol (PROVENTIL HFA;VENTOLIN HFA) 108 (90 BASE) MCG/ACT inhaler Inhale 2 puffs into the lungs every 4 (four) hours as needed for wheezing or shortness of breath.    Not used  recently  . albuterol (PROVENTIL) (2.5 MG/3ML) 0.083% nebulizer solution Take 2.5 mg by nebulization every 2 (two) hours as needed for wheezing or shortness of breath.   Not used recently  . hydrocortisone (ANUSOL-HC) 2.5 % rectal cream Place 1 application rectally 4 (four) times daily. 30 g 0 Not yet used    Assessment: 69 year old woman on warfarin  prior to admission for left mural thrombus to continue on warfarin while hospitalized.  Dose is 1.5mg  po daily.  INR on admission is therapeutic at 2.29. INR today 2.99 following continuation of home dose.  Goal of Therapy:  INR 2-3  Plan:  Will hold warfarin dose for today Daily INR.  Darl Pikes, PharmD Clinical Pharmacist- Resident Pager: [phone]  Darl Pikes 03/02/2015,9:28 AM

## 2015-03-02 NOTE — Progress Notes (Signed)
Patient Demographics:    [name], is a 69 y.o. female, DOB - 1946/04/12, EZM:[id number]  Admit date - 03/01/2015   Admitting Physician Theodis Blaze, MD  Outpatient Primary MD for the patient is FULP, CAMMIE, MD  LOS - 1   Chief Complaint  Patient presents with  . Abdominal Pain        Subjective:    [place] today has, No headache, No chest pain, +ve epigastric abdominal pain - No Nausea, No new weakness tingling or numbness, No Cough - SOB.    Assessment  & Plan :    Principal Problem:   Nausea and vomiting Active Problems:   ABDOMINAL PAIN -GENERALIZED   Acute on chronic respiratory failure   Acute renal failure superimposed on stage 4 chronic kidney disease   1.Abd Pain - GI following, stool studies pending, on PPI and Zofran for supportive care along with Bentyl. Per GI abdominal pain could be due to mesenteric ischemia from low flow state, cardiology requested to evaluate.   2. Ischemic cardiomyopathy with chronic combined systolic and diastolic heart failure EF around 30% on echocardiogram done in June 2016. Recently discharged from the hospital from CHF service, from fluid standpoint she appears to be compensated, she is currently on Coreg, Lasix which will be continued.   3. Syncopal episodes 2 with runs of V. tach on 03/02/2015. EF 30%, she did have borderline low magnesium and low potassium both of which have been replaced. Continue Coreg. Cardiology consulted. Likely will benefit from amiodarone drip. EKG ordered. Not on ACE/ARB due to CK D stage IV.   4. Left mural thrombus. Recently diagnosed. On Coumadin continue.   5. Hypertensive urgency on admission. Continue on Coreg, Imdur-hydralazine, diabetics. Blood pressure stable as needed IV hydralazine for  breakthrough.   6. CK D stage IV with chronic metabolic acidosis. His flank creatinine around 3.6 continue, on oral bicarbonate supplementation continue. Not on ACE/ARB.   7. DM type II. Continue present regimen.  Lab Results  Component Value Date   HGBA1C 7.1* 01/21/2015    CBG (last 3)   Recent Labs  03/01/15 1729 03/01/15 2127 03/02/15 0831  GLUCAP 186* 140* 91      Code Status : Full  Family Communication  : None   Disposition Plan  : Step down  Consults  :  Cards, GI  Procedures  :   DVT Prophylaxis  :  Coumadin  Lab Results  Component Value Date   INR 2.99* 03/02/2015   INR 2.29* 03/01/2015   INR 2.3 02/26/2015     Lab Results  Component Value Date   PLT 712* 03/02/2015    Inpatient Medications  Scheduled Meds: . amiodarone  150 mg Intravenous Once  . antiseptic oral rinse  7 mL Mouth Rinse q12n4p  . atorvastatin  20 mg Oral QHS  . carvedilol  12.5 mg Oral BID WC  . chlorhexidine  15 mL Mouth Rinse BID  . dicyclomine  10 mg Oral TID AC  . famotidine  20 mg Oral Daily  . feeding supplement  1 Container Oral TID BM  . feeding supplement (GLUCERNA 1.2 CAL)  237 mL Oral TID BM  . ferrous sulfate  325 mg Oral Q breakfast  .  furosemide  160 mg Oral BID  . hydrocortisone  1 application Rectal QID  . insulin aspart  0-9 Units Subcutaneous TID WC  . insulin glargine  40 Units Subcutaneous q morning - 10a  . ipratropium-albuterol  3 mL Nebulization TID  . isosorbide-hydrALAZINE  2 tablet Oral TID  . magnesium sulfate 1 - 4 g bolus IVPB  2 g Intravenous Once  . metolazone  5 mg Oral Daily  . mometasone-formoterol  2 puff Inhalation BID  . pantoprazole  40 mg Oral Daily  . potassium chloride  10 mEq Intravenous Q1 Hr x 4  . potassium chloride  40 mEq Oral Once  . saccharomyces boulardii  250 mg Oral BID  . sodium bicarbonate  650 mg Oral BID  . sodium chloride  3 mL Intravenous Q12H  . warfarin  1.5 mg Oral q1800  . Warfarin - Pharmacist  Dosing Inpatient   Does not apply q1800   Continuous Infusions: . amiodarone     Followed by  . amiodarone     PRN Meds:.acetaminophen, albuterol, fluticasone, loperamide, morphine injection, ondansetron **OR** ondansetron (ZOFRAN) IV, promethazine  Antibiotics  :   Anti-infectives    None        Objective:   Filed Vitals:   03/01/15 1114 03/01/15 2129 03/02/15 0543 03/02/15 0900  BP: 188/92 155/73 113/66 118/66  Pulse: 64 78 70 74  Temp: 97.6 F (36.4 C) 98 F (36.7 C) 97.8 F (36.6 C)   TempSrc: Axillary Oral Oral   Resp: 18 18 16    Weight:   77.8 kg (171 lb 8.3 oz)   SpO2: 100% 100% 96%     Wt Readings from Last 3 Encounters:  03/02/15 77.8 kg (171 lb 8.3 oz)  02/28/15 77.111 kg (170 lb)  02/25/15 79.379 kg (175 lb)     Intake/Output Summary (Last 24 hours) at 03/02/15 1029 Last data filed at 03/01/15 2300  Gross per 24 hour  Intake    180 ml  Output    600 ml  Net   -420 ml     Physical Exam  Awake Alert, Oriented X 3, No new F.N deficits, Normal affect [place].AT,PERRAL Supple Neck,No JVD, No cervical lymphadenopathy appriciated.  Symmetrical Chest wall movement, Good air movement bilaterally, CTAB RRR,No Gallops,Rubs or new Murmurs, No Parasternal Heave +ve B.Sounds, Abd Soft, No tenderness, No organomegaly appriciated, No rebound - guarding or rigidity. No Cyanosis, Clubbing or edema, No new Rash or bruise       Data Review:   Micro Results No results found for this or any previous visit (from the past 240 hour(s)).  Radiology Reports Dg Chest 2 View  02/24/2015   CLINICAL DATA:  Short of breath and weakness  EXAM: CHEST  2 VIEW  COMPARISON:  CT 02/14/2015  FINDINGS: Normal mediastinum and cardiac silhouette. Chronic central bronchitic markings. Normal pulmonary vasculature. No effusion, infiltrate, or pneumothorax.  IMPRESSION: Mild bronchitic markings.  No acute findings.   Electronically Signed   By: Suzy Bouchard M.D.   On: 02/24/2015 17:02     Ct Chest High Resolution  02/14/2015   CLINICAL DATA:  Sarcoid. Off prednisone, on Coumadin. Evaluate for disease progression. Chronic shortness of breath.  EXAM: CT CHEST WITHOUT CONTRAST  TECHNIQUE: Multidetector CT imaging of the chest was performed following the standard protocol without intravenous contrast. High resolution imaging of the lungs, as well as inspiratory and expiratory imaging, was performed.  COMPARISON:  04/28/2014 and 10/04/2010.  FINDINGS: Mediastinum/Nodes: No pathologically  enlarged mediastinal or axillary lymph nodes. Hilar regions are difficult to definitively evaluate without IV contrast. Atherosclerotic calcification of the arterial vasculature, including coronary arteries. Heart is mildly enlarged. No pericardial effusion.  Lungs/Pleura: Mild linear scarring and volume loss in both lower lobes, unchanged. No peribronchovascular nodularity. No subpleural reticulation, traction bronchiectasis/ bronchiolectasis, ground-glass, architectural distortion or honeycombing. No air trapping. Lungs are otherwise clear. No pleural fluid. Airway is unremarkable.  Upper abdomen: Partially calcified low-attenuation lesion in the posterior dome of the liver measures 1.9 cm and is unchanged over multiple prior exams, indicative of a benign lesion. Visualized portions of the liver and stomach are grossly unremarkable. Splenule in the left upper quadrant.  Musculoskeletal: No worrisome lytic or sclerotic lesions.  IMPRESSION: 1. No CT findings indicative of sarcoid. 2. Coronary artery calcification.   Electronically Signed   By: Lorin Picket M.D.   On: 02/14/2015 08:11     CBC  Recent Labs Lab 02/24/15 1612 03/01/15 0445 03/02/15 0733  WBC 9.0 10.1 9.6  HGB 13.2 14.0 13.8  HCT 39.9 41.4 43.0  PLT 581* 809* 712*  MCV 88.7 85.9 88.1  MCH 29.3 29.0 28.3  MCHC 33.1 33.8 32.1  RDW 16.5* 16.7* 17.4*    Chemistries   Recent Labs Lab 02/24/15 1612 02/24/15 1738 03/01/15 0445  03/01/15 1154 03/02/15 0733  NA 138  --  138  --  138  K 4.4  --  3.4*  --  3.1*  CL 106  --  107  --  108  CO2 18*  --  15*  --  20*  GLUCOSE 78  --  230*  --  102*  BUN 71*  --  50*  --  48*  CREATININE 4.09*  --  3.63*  --  3.70*  CALCIUM 10.0  --  9.7  --  9.0  MG  --   --   --  1.6*  --   AST  --  33 20  --   --   ALT  --  17 14  --   --   ALKPHOS  --  138* 156*  --   --   BILITOT  --  0.6 0.7  --   --    ------------------------------------------------------------------------------------------------------------------ estimated creatinine clearance is 14.5 mL/min (by C-G formula based on Cr of 3.7). ------------------------------------------------------------------------------------------------------------------ No results for input(s): HGBA1C in the last 72 hours. ------------------------------------------------------------------------------------------------------------------ No results for input(s): CHOL, HDL, LDLCALC, TRIG, CHOLHDL, LDLDIRECT in the last 72 hours. ------------------------------------------------------------------------------------------------------------------  Recent Labs  03/01/15 1154  TSH 0.895   ------------------------------------------------------------------------------------------------------------------ No results for input(s): VITAMINB12, FOLATE, FERRITIN, TIBC, IRON, RETICCTPCT in the last 72 hours.  Coagulation profile  Recent Labs Lab 02/26/15 03/01/15 1154 03/02/15 0733  INR 2.3 2.29* 2.99*    No results for input(s): DDIMER in the last 72 hours.  Cardiac Enzymes No results for input(s): CKMB, TROPONINI, MYOGLOBIN in the last 168 hours.  Invalid input(s): CK ------------------------------------------------------------------------------------------------------------------ Invalid input(s): POCBNP   Time Spent in minutes  35   Yigit Norkus K M.D on 03/02/2015 at 10:29 AM  Between 7am to 7pm - Pager -  [phone]  After 7pm go to [email or web address] - password [facility]  [facility] -  Office  [phone]

## 2015-03-02 NOTE — Progress Notes (Signed)
PT Cancellation Note  Patient Details Name: AREANA KOSANKE MRN: [id number] DOB: May 29, 1946   Cancelled Treatment:    Reason Eval/Treat Not Completed: Other (comment)   Noted 2 episodes of unresponsiveness earlier today and transfer to [facility];  Will hold PT for today and proceed with eval tomorrow;   Thanks,  Roney Marion, PT  Acute Rehabilitation Services Pager [phone] Office [phone]    Roney Marion [facility] 03/02/2015, 4:28 PM

## 2015-03-02 NOTE — Progress Notes (Signed)
     [facility] Progress Note  Subjective:  No nausea. Has not vomited today. No diarrhea last pm or today. Less abd pain. Tol clears.   Objective:  Vital signs in last 24 hours: Temp:  [97.6 F (36.4 C)-98 F (36.7 C)] 97.8 F (36.6 C) (07/31 0543) Pulse Rate:  [64-89] 70 (07/31 0543) Resp:  [16-20] 16 (07/31 0543) BP: (113-188)/(66-114) 113/66 mmHg (07/31 0543) SpO2:  [96 %-100 %] 96 % (07/31 0543) Weight:  [171 lb 8.3 oz (77.8 kg)] 171 lb 8.3 oz (77.8 kg) (07/31 0543) Last BM Date: 03/01/15 General:   Alert,  Well-developed,    in NAD Heart:  Regular rate and rhythm; no murmurs Pulm;lings clear Abdomen:  Soft, mild tenderness to palpation--less thanuesterday,  nondistended. Normal bowel sounds, without guarding, and without rebound.   Extremities:  Without edema. Neurologic:Alert and  oriented x4;  grossly normal neurologically. Psych: Alert and cooperative. Normal mood and affect.  Intake/Output from previous day: 07/30 0701 - 07/31 0700 In: 180 [P.O.:180] Out: 900 [Urine:800; Stool:100] Intake/Output this shift:    Lab Results:  Recent Labs  03/01/15 0445 03/02/15 0733  WBC 10.1 9.6  HGB 14.0 13.8  HCT 41.4 43.0  PLT 809* 712*   BMET  Recent Labs  03/01/15 0445 03/02/15 0733  NA 138 138  K 3.4* 3.1*  CL 107 108  CO2 15* 20*  GLUCOSE 230* 102*  BUN 50* 48*  CREATININE 3.63* 3.70*  CALCIUM 9.7 9.0   LFT  Recent Labs  03/01/15 0445 03/02/15 0733  PROT 6.8  --   ALBUMIN 2.7* 2.4*  AST 20  --   ALT 14  --   ALKPHOS 156*  --   BILITOT 0.7  --    PT/INR  Recent Labs  03/01/15 1154 03/02/15 0733  LABPROT 25.0* 30.5*  INR 2.29* 2.99*     ASSESSMENT/PLAN:   69 year old female with chronic nausea/vomiting/diarrhea. Has non-focal abdominal pain. Alkaline phosphatase elevated, AST and ALT normal. Has had cholecystectomy in past. Previous CT nonrevealing and recent GES normal. CT of 12/16/2014 with suggestion of cirrhosis.  Current PT/INR pending. ? If nausea related to her progressive renal failure. Continue antiemetics. PPI once daily. Recommend cardiology evaluation for cardiomyopathy and heart failure that could be causing low flow mesenteric insufficiency.    LOS: 1 day   Hvozdovic, Deloris Ping 03/02/2015, Pager [phone]     Attending physician's note   I have taken an interval history, reviewed the chart and examined the patient. I agree with the Advanced Practitioner's note, impression and recommendations. Abd pain, nausea, vomiting, diarrhea all have resolved. No plans for additional GI evaluation at this time. Manage symptomatically. Continue Bentyl tid, PPI qam, phenergan as needed. Possible mesenteric insufficiency. Cardiology to evaluate and optimize CM, HF mgmt. GI signing off. Outpatient GI follow up with Dr. Scarlette Shorts.   Pricilla Riffle. Fuller Plan, MD Mar[facility] Regal [phone] pager Mon-Fri 8a-5p [phone] weekends, holidays and 5p-8a or per [facility]

## 2015-03-02 NOTE — Progress Notes (Addendum)
Late Entry  Code Blue was called by staff for patient who became unresponsive around 1015.  Upon my arrival Code Blue was cancelled.  AS per RN Patient had 2 unresponsive episodes this am.  Upon my arrival to bedside, Patient in bed with c/o feeling dizzy, lightheaded and clammy.  Vitals taken, hypertensive 144/127, HR 80's, Sat 100 on Ra, RR 15.  Patient c/o of abdomen pain.  MD at bedside, orders given.  Assisted with transport to 3S, patient in bed with monitor

## 2015-03-02 NOTE — Progress Notes (Signed)
Pt had  twosyncopal episodes this morning. Pt was alert and speaking and all of a sudden her speech became garbled, eyes became fixed, and she became unresponsive. Period of unresponsiveness lasted 20+ seconds. MD made aware and rapid response called.

## 2015-03-02 NOTE — Progress Notes (Signed)
Patient sister Anne Ng notified that patient moved to 3S11.

## 2015-03-02 NOTE — Progress Notes (Signed)
Report called to RN on [place] and pt transferred.

## 2015-03-03 ENCOUNTER — Telehealth: Payer: Self-pay | Admitting: *Deleted

## 2015-03-03 ENCOUNTER — Other Ambulatory Visit: Payer: Self-pay | Admitting: *Deleted

## 2015-03-03 DIAGNOSIS — N184 Chronic kidney disease, stage 4 (severe): Secondary | ICD-10-CM

## 2015-03-03 DIAGNOSIS — N179 Acute kidney failure, unspecified: Secondary | ICD-10-CM

## 2015-03-03 LAB — BASIC METABOLIC PANEL
Anion gap: 7 (ref 5–15)
BUN: 52 mg/dL — AB (ref 6–20)
CALCIUM: 8.9 mg/dL (ref 8.9–10.3)
CHLORIDE: 107 mmol/L (ref 101–111)
CO2: 19 mmol/L — ABNORMAL LOW (ref 22–32)
Creatinine, Ser: 4.73 mg/dL — ABNORMAL HIGH (ref 0.44–1.00)
GFR calc Af Amer: 10 mL/min — ABNORMAL LOW (ref >60)
GFR, EST NON AFRICAN AMERICAN: 9 mL/min — AB (ref >60)
GLUCOSE: 78 mg/dL (ref 65–99)
Potassium: 5.1 mmol/L (ref 3.5–5.1)
Sodium: 133 mmol/L — ABNORMAL LOW (ref 135–145)

## 2015-03-03 LAB — MAGNESIUM: Magnesium: 2.2 mg/dL (ref 1.7–2.4)

## 2015-03-03 LAB — OSMOLALITY: OSMOLALITY: 300 mosm/kg (ref 275–300)

## 2015-03-03 LAB — GLUCOSE, CAPILLARY
GLUCOSE-CAPILLARY: 115 mg/dL — AB (ref 65–99)
GLUCOSE-CAPILLARY: 93 mg/dL (ref 65–99)
Glucose-Capillary: 86 mg/dL (ref 65–99)
Glucose-Capillary: 98 mg/dL (ref 65–99)

## 2015-03-03 LAB — PROTIME-INR
INR: 3.68 — ABNORMAL HIGH (ref 0.00–1.49)
Prothrombin Time: 35.7 seconds — ABNORMAL HIGH (ref 11.6–15.2)

## 2015-03-03 MED ORDER — FUROSEMIDE 80 MG PO TABS: 80.0000 mg | ORAL_TABLET | Freq: Two times a day (BID) | ORAL | Status: DC

## 2015-03-03 MED ORDER — CARVEDILOL 6.25 MG PO TABS
6.2500 mg | ORAL_TABLET | Freq: Two times a day (BID) | ORAL | Status: DC
  Administered 2015-03-03 – 2015-03-06 (×7): 6.25 mg via ORAL
  Filled 2015-03-03 (×8): qty 1

## 2015-03-03 MED ORDER — ESOMEPRAZOLE MAGNESIUM 40 MG PO CPDR: DELAYED_RELEASE_CAPSULE | ORAL | Status: DC

## 2015-03-03 MED ORDER — ISOSORB DINITRATE-HYDRALAZINE 20-37.5 MG PO TABS: 1.0000 | ORAL_TABLET | Freq: Three times a day (TID) | ORAL | Status: DC

## 2015-03-03 MED ORDER — ISOSORB DINITRATE-HYDRALAZINE 20-37.5 MG PO TABS
1.0000 | ORAL_TABLET | Freq: Three times a day (TID) | ORAL | Status: DC
  Administered 2015-03-03: 1 via ORAL
  Filled 2015-03-03 (×3): qty 1

## 2015-03-03 MED ORDER — CARVEDILOL 6.25 MG PO TABS
6.2500 mg | ORAL_TABLET | Freq: Two times a day (BID) | ORAL | Status: DC
  Filled 2015-03-03 (×2): qty 1

## 2015-03-03 MED ORDER — SODIUM CHLORIDE 0.9 % IV BOLUS (SEPSIS)
500.0000 mL | Freq: Once | INTRAVENOUS | Status: AC
  Administered 2015-03-03: 500 mL via INTRAVENOUS

## 2015-03-03 MED ORDER — ISOSORB DINITRATE-HYDRALAZINE 20-37.5 MG PO TABS: 0.5000 | ORAL_TABLET | Freq: Three times a day (TID) | ORAL | Status: DC

## 2015-03-03 MED ORDER — PRO-STAT SUGAR FREE PO LIQD
30.0000 mL | Freq: Two times a day (BID) | ORAL | Status: DC
  Administered 2015-03-04 – 2015-03-05 (×3): 30 mL via ORAL
  Filled 2015-03-03 (×5): qty 30

## 2015-03-03 MED ORDER — ADULT MULTIVITAMIN W/MINERALS CH
1.0000 | ORAL_TABLET | Freq: Every day | ORAL | Status: DC
  Administered 2015-03-04 – 2015-03-06 (×3): 1 via ORAL
  Filled 2015-03-03 (×3): qty 1

## 2015-03-03 MED ORDER — ISOSORB DINITRATE-HYDRALAZINE 20-37.5 MG PO TABS
0.5000 | ORAL_TABLET | Freq: Three times a day (TID) | ORAL | Status: DC
  Administered 2015-03-04 – 2015-03-06 (×8): 0.5 via ORAL
  Filled 2015-03-03: qty 1
  Filled 2015-03-03: qty 0.5
  Filled 2015-03-03: qty 1
  Filled 2015-03-03: qty 0.5
  Filled 2015-03-03: qty 1
  Filled 2015-03-03: qty 0.5
  Filled 2015-03-03 (×2): qty 1

## 2015-03-03 MED ORDER — SODIUM CHLORIDE 0.9 % IV SOLN
INTRAVENOUS | Status: AC
  Administered 2015-03-03: 08:00:00 via INTRAVENOUS

## 2015-03-03 MED ORDER — FUROSEMIDE 80 MG PO TABS
80.0000 mg | ORAL_TABLET | Freq: Two times a day (BID) | ORAL | Status: DC
  Administered 2015-03-04 – 2015-03-06 (×5): 80 mg via ORAL
  Filled 2015-03-03 (×6): qty 1

## 2015-03-03 NOTE — Progress Notes (Signed)
OT Cancellation Note  Patient Details Name: NELSY MADONNA MRN: [id number] DOB: 04-30-1946   Cancelled Treatment:    Reason Eval/Treat Not Completed: Patient at procedure or test/ unavailable  [place], OTR/L  [phone] 03/03/2015 03/03/2015, 6:00 PM

## 2015-03-03 NOTE — Progress Notes (Signed)
Initial Nutrition Assessment  DOCUMENTATION CODES:   Severe malnutrition in context of acute illness/injury, Severe malnutrition in context of chronic illness  INTERVENTION:  Boost Breeze po TID, each supplement provides 250 kcal and 9 grams of protein Provide 30 ml Pro-stat BID, each dose provides 100 kcal and 15 grams of protein Provide Mighty Shakes with meals when diet is advanced past clear liquids Provide Multivitamin with minerals daily   NUTRITION DIAGNOSIS:   Malnutrition related to vomiting, nausea as evidenced by energy intake < 75% for > or equal to 1 month, moderate depletion of body fat, moderate depletions of muscle mass, percent weight loss.   GOAL:   Patient will meet greater than or equal to 90% of their needs   MONITOR:   PO intake, Diet advancement, Supplement acceptance, Labs, Weight trends, I & O's  REASON FOR ASSESSMENT:   Malnutrition Screening Tool    ASSESSMENT:  69 year old female with known history of chronic nausea and vomiting with abdominal cramping, follows with gastroenterologist at [facility] clinic, also with known history of diabetes, hypertension, stage IV chronic kidney disease with baseline creatinine between 3 and 4, chronic combined systolic and diastolic CHF, recently diagnosed with left ventricular thrombus and has been on Coumadin for several months, 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 Patient now presents to [facility] emergency department with main concern of progressively worsening nausea and nonbloody vomiting.  Pt states that she used to maintain her weight arounf 205-210 lbs back in May but, since May she has had ongoing nausea and vomiting and has lost down to 164 lbs. She reports being able to tolerate small amounts of chicken broth but, nothing else. She states that Zofran provides some relief. Weight history shows pt has lost 21% of her body weight within the past 3 months. She has tried  some nutritional supplements but, doesn't care for them much. She is agreeable to trying nutritional supplements. RD encouraged eating small frequent amounts and consuming nutritional supplements as tolerated. Dietitian will continue to follow along for support.   Labs: low sodium, high creatinine, low GFR  Diet Order:  Diet clear liquid Room service appropriate?: Yes; Fluid consistency:: Thin  Skin:  Reviewed, no issues  Last BM:  7/31  Height:   Ht Readings from Last 1 Encounters:  03/02/15 5\' 2"  (1.575 m)    Weight:   Wt Readings from Last 1 Encounters:  03/03/15 164 lb 7.4 oz (74.6 kg)    Ideal Body Weight:  50 kg  BMI:  Body mass index is 30.07 kg/(m^2).  Estimated Nutritional Needs:   Kcal:  1900-2100  Protein:  80-90 grams  Fluid:  1.9-2.1 L/day  EDUCATION NEEDS:   No education needs identified at this time  [name] RD, LDN Inpatient Clinical Dietitian Pager: [phone] After Hours Pager: [phone]

## 2015-03-03 NOTE — Telephone Encounter (Signed)
Per Nicoletta Ba PA, I faxed the office note from 02-28-2015 to [place], attention Dr. Joelyn Oms.

## 2015-03-03 NOTE — Telephone Encounter (Signed)
We received a prior authorization for Nexium 40 mg.  I made an error sending the prescription to [place]. I sent # 60 with refills but Amy reduced the dosage to once daily.  I just sent another prescription for # 30 with 6 refills to [place].  The patient is currently in the hospital at [facility].  Called [facility] to advise. I also informed them the Anusol HC Cream they can cancel. Once the patient is home I will call her to let her know to get Preperation H Cream or Equate from [facility].The Bentyl 10 mg also needed a prior auth.  The patient in inpatient and as of 03-01-2015, she has been given, Bentyl 10 mg, Pepcid, and Pantoprazole sodium 40 mg.

## 2015-03-03 NOTE — Progress Notes (Signed)
Patient Demographics:    [name], is a 69 y.o. female, DOB - [birth year], FGH:[id number]  Admit date - 03/01/2015   Admitting Physician Theodis Blaze, MD  Outpatient Primary MD for the patient is FULP, CAMMIE, MD  LOS - 2   Chief Complaint  Patient presents with  . Abdominal Pain        Subjective:    [place] today has, No headache, No chest pain, +ve epigastric abdominal pain - No Nausea, No new weakness tingling or numbness, No Cough - SOB. Overall feels better today.   Assessment  & Plan :     1.Abd Pain - GI following, stool studies pending, on PPI and Zofran for supportive care along with Bentyl. Per GI abdominal pain could be due to mesenteric ischemia from low flow state, cardiology following as well. Clinically better.   2. Ischemic cardiomyopathy with chronic combined systolic and diastolic heart failure EF around 30% on echocardiogram done in June 2016. Recently discharged from the hospital from CHF service, from fluid standpoint she appears to be compensated, she is currently on Coreg, Lasix which will be continued. Cardiology on board.   3. Syncopal episodes 2 with runs of V. tach on 03/02/2015. EF 30%, she did have borderline low magnesium and low potassium both of which have been replaced. Continue Coreg. Per cardiology more likely to be artifact than V. tach, continue to monitor on telemetry. Not on ACE/ARB due to ARF. On Coreg which will be continued. Cardiology following.   4. Left mural thrombus. Recently diagnosed. On Coumadin continue.   5. Hypertensive urgency on admission. Continue on Coreg, Imdur-hydralazine, diabetics. Blood pressure stable as needed IV hydralazine for breakthrough.   6. 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 ACE line  creatinine around 3.6 continue, on oral bicarbonate supplementation continue. Creatinine on 03/03/2015 is worse, she actually looks dry to me and was hypotensive overnight, blood pressure medications reduced, Lasix skipped, gently hydrate, recent renal ultrasound noted nonacute, renal requested to evaluate. Not on ACE/ARB.   7. DM type II. Continue present regimen.  Lab Results  Component Value Date   HGBA1C 7.1* 01/21/2015    CBG (last 3)   Recent Labs  03/02/15 1639 03/02/15 2129 03/03/15 0813  GLUCAP 80 78 86      Code Status : Full  Family Communication  : None   Disposition Plan  : Step down  Consults  :  Cards, GI  Procedures  :   DVT Prophylaxis  :  Coumadin  Lab Results  Component Value Date   INR 3.68* 03/03/2015   INR 2.99* 03/02/2015   INR 2.29* 03/01/2015     Lab Results  Component Value Date   PLT 712* 03/02/2015    Inpatient Medications  Scheduled Meds: . antiseptic oral rinse  7 mL Mouth Rinse q12n4p  . atorvastatin  20 mg Oral QHS  . carvedilol  12.5 mg Oral BID WC  . chlorhexidine  15 mL Mouth Rinse BID  . dicyclomine  10 mg Oral TID AC  . famotidine  20 mg Oral Daily  . feeding supplement  1 Container Oral TID BM  . feeding supplement (GLUCERNA 1.2 CAL)  237 mL Oral TID BM  .  ferrous sulfate  325 mg Oral Q breakfast  . [START ON 03/04/2015] furosemide  80 mg Oral BID  . hydrocortisone  1 application Rectal QID  . insulin aspart  0-9 Units Subcutaneous TID WC  . insulin glargine  40 Units Subcutaneous q morning - 10a  . isosorbide-hydrALAZINE  1 tablet Oral TID  . mometasone-formoterol  2 puff Inhalation BID  . pantoprazole  40 mg Oral Daily  . saccharomyces boulardii  250 mg Oral BID  . sodium bicarbonate  650 mg Oral BID  . sodium chloride  3 mL Intravenous Q12H  . warfarin  1.5 mg Oral q1800  . Warfarin - Pharmacist Dosing Inpatient   Does not apply q1800   Continuous Infusions: . sodium chloride 75 mL/hr at 03/03/15 0803    PRN Meds:.acetaminophen, albuterol, fluticasone, loperamide, morphine injection  Antibiotics  :   Anti-infectives    None        Objective:   Filed Vitals:   03/03/15 0320 03/03/15 0806 03/03/15 0819 03/03/15 0850  BP: 95/39 111/53 118/54   Pulse: 90 81    Temp: 97.9 F (36.6 C) 98 F (36.7 C)    TempSrc: Oral Oral    Resp: 25 13 16    Height:      Weight: 74.6 kg (164 lb 7.4 oz)     SpO2: 100% 100%  100%    Wt Readings from Last 3 Encounters:  03/03/15 74.6 kg (164 lb 7.4 oz)  02/28/15 77.111 kg (170 lb)  02/25/15 79.379 kg (175 lb)     Intake/Output Summary (Last 24 hours) at 03/03/15 0857 Last data filed at 03/03/15 0806  Gross per 24 hour  Intake 123.75 ml  Output    250 ml  Net -126.25 ml     Physical Exam  Awake Alert, Oriented X 3, No new F.N deficits, Normal affect [place].AT,PERRAL Supple Neck,No JVD, No cervical lymphadenopathy appriciated.  Symmetrical Chest wall movement, Good air movement bilaterally, CTAB RRR,No Gallops,Rubs or new Murmurs, No Parasternal Heave +ve B.Sounds, Abd Soft, No tenderness, No organomegaly appriciated, No rebound - guarding or rigidity. No Cyanosis, Clubbing or edema, No new Rash or bruise       Data Review:   Micro Results Recent Results (from the past 240 hour(s))  MRSA PCR Screening     Status: None   Collection Time: 03/02/15  7:01 PM  Result Value Ref Range Status   MRSA by PCR NEGATIVE NEGATIVE Final    Comment:        The GeneXpert MRSA Assay (FDA approved for NASAL specimens only), is one component of a comprehensive MRSA colonization surveillance program. It is not intended to diagnose MRSA infection nor to guide or monitor treatment for MRSA infections.     Radiology Reports Dg Chest 2 View  02/24/2015   CLINICAL DATA:  Short of breath and weakness  EXAM: CHEST  2 VIEW  COMPARISON:  CT 02/14/2015  FINDINGS: Normal mediastinum and cardiac silhouette. Chronic central bronchitic markings. Normal  pulmonary vasculature. No effusion, infiltrate, or pneumothorax.  IMPRESSION: Mild bronchitic markings.  No acute findings.   Electronically Signed   By: Suzy Bouchard M.D.   On: 02/24/2015 17:02   Ct Chest High Resolution  02/14/2015   CLINICAL DATA:  Sarcoid. Off prednisone, on Coumadin. Evaluate for disease progression. Chronic shortness of breath.  EXAM: CT CHEST WITHOUT CONTRAST  TECHNIQUE: Multidetector CT imaging of the chest was performed following the standard protocol without intravenous contrast. High resolution imaging  of the lungs, as well as inspiratory and expiratory imaging, was performed.  COMPARISON:  04/28/2014 and 10/04/2010.  FINDINGS: Mediastinum/Nodes: No pathologically enlarged mediastinal or axillary lymph nodes. Hilar regions are difficult to definitively evaluate without IV contrast. Atherosclerotic calcification of the arterial vasculature, including coronary arteries. Heart is mildly enlarged. No pericardial effusion.  Lungs/Pleura: Mild linear scarring and volume loss in both lower lobes, unchanged. No peribronchovascular nodularity. No subpleural reticulation, traction bronchiectasis/ bronchiolectasis, ground-glass, architectural distortion or honeycombing. No air trapping. Lungs are otherwise clear. No pleural fluid. Airway is unremarkable.  Upper abdomen: Partially calcified low-attenuation lesion in the posterior dome of the liver measures 1.9 cm and is unchanged over multiple prior exams, indicative of a benign lesion. Visualized portions of the liver and stomach are grossly unremarkable. Splenule in the left upper quadrant.  Musculoskeletal: No worrisome lytic or sclerotic lesions.  IMPRESSION: 1. No CT findings indicative of sarcoid. 2. Coronary artery calcification.   Electronically Signed   By: Lorin Picket M.D.   On: 02/14/2015 08:11     CBC  Recent Labs Lab 02/24/15 1612 03/01/15 0445 03/02/15 0733  WBC 9.0 10.1 9.6  HGB 13.2 14.0 13.8  HCT 39.9 41.4 43.0   PLT 581* 809* 712*  MCV 88.7 85.9 88.1  MCH 29.3 29.0 28.3  MCHC 33.1 33.8 32.1  RDW 16.5* 16.7* 17.4*    Chemistries   Recent Labs Lab 02/24/15 1612 02/24/15 1738 03/01/15 0445 03/01/15 1154 03/02/15 0733 03/03/15 0307  NA 138  --  138  --  138 133*  K 4.4  --  3.4*  --  3.1* 5.1  CL 106  --  107  --  108 107  CO2 18*  --  15*  --  20* 19*  GLUCOSE 78  --  230*  --  102* 78  BUN 71*  --  50*  --  48* 52*  CREATININE 4.09*  --  3.63*  --  3.70* 4.73*  CALCIUM 10.0  --  9.7  --  9.0 8.9  MG  --   --   --  1.6*  --  2.2  AST  --  33 20  --   --   --   ALT  --  17 14  --   --   --   ALKPHOS  --  138* 156*  --   --   --   BILITOT  --  0.6 0.7  --   --   --    ------------------------------------------------------------------------------------------------------------------ estimated creatinine clearance is 11.1 mL/min (by C-G formula based on Cr of 4.73). ------------------------------------------------------------------------------------------------------------------ No results for input(s): HGBA1C in the last 72 hours. ------------------------------------------------------------------------------------------------------------------ No results for input(s): CHOL, HDL, LDLCALC, TRIG, CHOLHDL, LDLDIRECT in the last 72 hours. ------------------------------------------------------------------------------------------------------------------  Recent Labs  03/01/15 1154  TSH 0.895   ------------------------------------------------------------------------------------------------------------------ No results for input(s): VITAMINB12, FOLATE, FERRITIN, TIBC, IRON, RETICCTPCT in the last 72 hours.  Coagulation profile  Recent Labs Lab 02/26/15 03/01/15 1154 03/02/15 0733 03/03/15 0307  INR 2.3 2.29* 2.99* 3.68*    No results for input(s): DDIMER in the last 72 hours.  Cardiac Enzymes No results for input(s): CKMB, TROPONINI, MYOGLOBIN in the last 168 hours.  Invalid  input(s): CK ------------------------------------------------------------------------------------------------------------------ Invalid input(s): POCBNP   Time Spent in minutes  35   SINGH,PRASHANT K M.D on 03/03/2015 at 8:57 AM  Between 7am to 7pm - Pager - [phone]  After 7pm go to [email or web address] - password [email or web address]  [facility] -  Office  [phone]

## 2015-03-03 NOTE — Progress Notes (Signed)
[facility]  Patient Status: Active with visits up until hospitalization   [facility] is providing the following services: SN, PT, OT  If patient discharges after hours, please call [phone].   [name] 03/03/2015, 2:18 PM

## 2015-03-03 NOTE — Progress Notes (Signed)
Paged dr Candiss Norse regarding pt's bp 117/59 when bidil given and now bp 88/52, new orders rec'd to give 500cc bolus and hold bidil till tomorrow.

## 2015-03-03 NOTE — Evaluation (Signed)
Physical Therapy Evaluation Patient Details Name: [name] MRN: [id number] DOB: 09/10/1945 Today's Date: 03/03/2015   History of Present Illness  Patient is 69 year old female with known history of chronic nausea and vomiting follows with gastroenterologist, also with known history of diabetes, hypertension, stage IV chronic kidney disease , chronic combined systolic and diastolic CHF, recently diagnosed with left ventricular thrombus, COPD with nocturnal oxygen .Presents to [facility] with progressively worsening nausea and nonbloody vomiting, 2 periods of unresponsiveness 7/31  Clinical Impression  Pt moving well despite right foot pain which she reports is periodic at home as well. Pt with education for RW, exercises and continued mobility. Pt reports 2 falls recently at home and currently working with [place]. Pt will benefit from acute therapy to maximize mobility, gait and function to increase independence and decrease fall risk.     Follow Up Recommendations Home health PT    Equipment Recommendations  None recommended by PT    Recommendations for Other Services       Precautions / Restrictions Precautions Precautions: Fall Restrictions Weight Bearing Restrictions: No      Mobility  Bed Mobility Overal bed mobility: Modified Independent                Transfers Overall transfer level: Modified independent                  Ambulation/Gait Ambulation/Gait assistance: Supervision Ambulation Distance (Feet): 160 Feet Assistive device: Rolling walker (2 wheeled) Gait Pattern/deviations: Step-through pattern;Decreased stride length   Gait velocity interpretation: Below normal speed for age/gender General Gait Details: cues for positionin RW  Stairs            Wheelchair Mobility    Modified Rankin (Stroke Patients Only)       Balance                                             Pertinent Vitals/Pain Pain Assessment:  0-10 Pain Score: 7  Pain Location: right foot Pain Descriptors / Indicators: Aching Pain Intervention(s): Limited activity within patient's tolerance;Monitored during session;Repositioned  HR 92 sats 98-100% on RA 117/59 (73) standing    Home Living Family/patient expects to be discharged to:: Private residence Living Arrangements: Alone Available Help at Discharge: Family Type of Home: Apartment Home Access: Level entry     Home Layout: One level Home Equipment: Tub bench;Grab bars - toilet;Walker - 2 wheels;Walker - standard;Bedside commode;Wheelchair - Psychologist, educational      Prior Function Level of Independence: Independent with assistive device(s)         Comments: uses walker all the time, oxygen at night and when active. Does her own housework with some assist from nieces and family     Hand Dominance        Extremity/Trunk Assessment   Upper Extremity Assessment: Overall WFL for tasks assessed           Lower Extremity Assessment: Overall WFL for tasks assessed      Cervical / Trunk Assessment: Normal  Communication   Communication: No difficulties  Cognition Arousal/Alertness: Awake/alert Behavior During Therapy: WFL for tasks assessed/performed Overall Cognitive Status: Within Functional Limits for tasks assessed                      General Comments      Exercises  Assessment/Plan    PT Assessment Patient needs continued PT services  PT Diagnosis Difficulty walking;Acute pain   PT Problem List Decreased activity tolerance;Decreased balance;Decreased knowledge of use of DME;Pain  PT Treatment Interventions Gait training;Functional mobility training;Therapeutic activities;Therapeutic exercise;Patient/family education   PT Goals (Current goals can be found in the Care Plan section) Acute Rehab PT Goals Patient Stated Goal: return home PT Goal Formulation: With patient Time For Goal Achievement: 03/10/15 Potential  to Achieve Goals: Good    Frequency Min 3X/week   Barriers to discharge Decreased caregiver support      Co-evaluation               End of Session   Activity Tolerance: Patient tolerated treatment well Patient left: in chair;with call bell/phone within reach Nurse Communication: Mobility status         Time: 5625-6389 PT Time Calculation (min) (ACUTE ONLY): 23 min   Charges:   PT Evaluation $Initial PT Evaluation Tier I: 1 Procedure     PT G CodesMelford Aase 03/03/2015, 9:23 AM Elwyn Reach, [place]

## 2015-03-03 NOTE — Progress Notes (Signed)
ANTICOAGULATION CONSULT NOTE - Follow Up Consult  Pharmacy Consult for Coumadin Indication: L mural thrombus  Allergies  Allergen Reactions  . Adhesive [Tape] Other (See Comments)    Blisters   . Avelox [Moxifloxacin Hcl In Nacl] Other (See Comments)    GI upset  . Codeine Other (See Comments)    "crazy"   . Guaifenesin Nausea And Vomiting    Takes Mucinex at home without issue  . Latex Swelling  . Oxycodone Nausea And Vomiting    Takes Percocet at home without issue    Patient Measurements: Height: 5\' 2"  (157.5 cm) Weight: 164 lb 7.4 oz (74.6 kg) IBW/kg (Calculated) : 50.1   Vital Signs: Temp: 98 F (36.7 C) (08/01 0806) Temp Source: Oral (08/01 0806) BP: 117/59 mmHg (08/01 0916) Pulse Rate: 92 (08/01 0916)  Labs:  Recent Labs  03/01/15 0445 03/01/15 1154 03/02/15 0733 03/03/15 0307  HGB 14.0  --  13.8  --   HCT 41.4  --  43.0  --   PLT 809*  --  712*  --   LABPROT  --  25.0* 30.5* 35.7*  INR  --  2.29* 2.99* 3.68*  CREATININE 3.63*  --  3.70* 4.73*   Estimated Creatinine Clearance: 11.1 mL/min (by C-G formula based on Cr of 4.73).  Assessment: 69 yo W on warfarin prior to admission for left mural thrombus to continue on warfarin while hospitalized. Dose is 1.5mg  po daily. INR on admission was therapeutic at 2.29. After one dose INR jumped to 2.99. Then received another dose yesterday and now INR is supratherapeutic at 3.68. CBC stable, no s/s of bleed  Goal of Therapy:  INR 2-3 Monitor platelets by anticoagulation protocol: Yes   Plan:  HOLD coumadin today Monitor daily INR, CBC, s/s of bleed  Angelette Ganus J 03/03/2015,10:53 AM

## 2015-03-03 NOTE — Progress Notes (Signed)
Agree with assessment and plans 

## 2015-03-03 NOTE — Progress Notes (Deleted)
Patient Name: [name] Date of Encounter: 03/03/2015   SUBJECTIVE  Feels better. Denies chest pain, SOB or palpitation. No further syncope episode.   CURRENT MEDS . antiseptic oral rinse  7 mL Mouth Rinse q12n4p  . atorvastatin  20 mg Oral QHS  . carvedilol  12.5 mg Oral BID WC  . chlorhexidine  15 mL Mouth Rinse BID  . dicyclomine  10 mg Oral TID AC  . famotidine  20 mg Oral Daily  . feeding supplement  1 Container Oral TID BM  . feeding supplement (GLUCERNA 1.2 CAL)  237 mL Oral TID BM  . ferrous sulfate  325 mg Oral Q breakfast  . [START ON 03/04/2015] furosemide  80 mg Oral BID  . hydrocortisone  1 application Rectal QID  . insulin aspart  0-9 Units Subcutaneous TID WC  . insulin glargine  40 Units Subcutaneous q morning - 10a  . [START ON 03/04/2015] isosorbide-hydrALAZINE  1 tablet Oral TID  . mometasone-formoterol  2 puff Inhalation BID  . pantoprazole  40 mg Oral Daily  . saccharomyces boulardii  250 mg Oral BID  . sodium bicarbonate  650 mg Oral BID  . sodium chloride  3 mL Intravenous Q12H  . Warfarin - Pharmacist Dosing Inpatient   Does not apply q1800    OBJECTIVE  Filed Vitals:   03/03/15 0916 03/03/15 1049 03/03/15 1130 03/03/15 1200  BP: 117/59 88/52 120/55 116/96  Pulse: 92 78  72  Temp:      TempSrc:      Resp:  23 18 12   Height:      Weight:      SpO2: 99% 98%  98%    Intake/Output Summary (Last 24 hours) at 03/03/15 1240 Last data filed at 03/03/15 1200  Gross per 24 hour  Intake 1276.25 ml  Output    250 ml  Net 1026.25 ml   Filed Weights   03/02/15 0543 03/02/15 1122 03/03/15 0320  Weight: 171 lb 8.3 oz (77.8 kg) 164 lb 0.4 oz (74.4 kg) 164 lb 7.4 oz (74.6 kg)    PHYSICAL EXAM  General: Pleasant, NAD. Neuro: Alert and oriented X 3. Moves all extremities spontaneously. Psych: Normal affect. HEENT:  Normal  Neck: Supple without bruits or JVD. Lungs:  Resp regular and unlabored, CTA. Heart: RRR no s3, s4, or murmurs. Abdomen:  Soft, non-tender, non-distended, BS + x 4.  Extremities: No clubbing, cyanosis or edema. DP/PT/Radials 2+ and equal bilaterally.  Accessory Clinical Findings  CBC  Recent Labs  03/01/15 0445 03/02/15 0733  WBC 10.1 9.6  HGB 14.0 13.8  HCT 41.4 43.0  MCV 85.9 88.1  PLT 809* 450*   Basic Metabolic Panel  Recent Labs  03/01/15 1154 03/02/15 0733 03/03/15 0307  NA  --  138 133*  K  --  3.1* 5.1  CL  --  108 107  CO2  --  20* 19*  GLUCOSE  --  102* 78  BUN  --  48* 52*  CREATININE  --  3.70* 4.73*  CALCIUM  --  9.0 8.9  MG 1.6*  --  2.2  PHOS 4.6 6.2*  --    Liver Function Tests  Recent Labs  03/01/15 0445 03/02/15 0733  AST 20  --   ALT 14  --   ALKPHOS 156*  --   BILITOT 0.7  --   PROT 6.8  --   ALBUMIN 2.7* 2.4*    Recent Labs  03/01/15 0445  LIPASE 24    Thyroid Function Tests  Recent Labs  03/01/15 1154  TSH 0.895    TELE  NSR  Radiology/Studies  Dg Chest 2 View  02/24/2015   CLINICAL DATA:  Short of breath and weakness  EXAM: CHEST  2 VIEW  COMPARISON:  CT 02/14/2015  FINDINGS: Normal mediastinum and cardiac silhouette. Chronic central bronchitic markings. Normal pulmonary vasculature. No effusion, infiltrate, or pneumothorax.  IMPRESSION: Mild bronchitic markings.  No acute findings.   Electronically Signed   By: Suzy Bouchard M.D.   On: 02/24/2015 17:02   Ct Chest High Resolution  02/14/2015   CLINICAL DATA:  Sarcoid. Off prednisone, on Coumadin. Evaluate for disease progression. Chronic shortness of breath.  EXAM: CT CHEST WITHOUT CONTRAST  TECHNIQUE: Multidetector CT imaging of the chest was performed following the standard protocol without intravenous contrast. High resolution imaging of the lungs, as well as inspiratory and expiratory imaging, was performed.  COMPARISON:  04/28/2014 and 10/04/2010.  FINDINGS: Mediastinum/Nodes: No pathologically enlarged mediastinal or axillary lymph nodes. Hilar regions are difficult to definitively  evaluate without IV contrast. Atherosclerotic calcification of the arterial vasculature, including coronary arteries. Heart is mildly enlarged. No pericardial effusion.  Lungs/Pleura: Mild linear scarring and volume loss in both lower lobes, unchanged. No peribronchovascular nodularity. No subpleural reticulation, traction bronchiectasis/ bronchiolectasis, ground-glass, architectural distortion or honeycombing. No air trapping. Lungs are otherwise clear. No pleural fluid. Airway is unremarkable.  Upper abdomen: Partially calcified low-attenuation lesion in the posterior dome of the liver measures 1.9 cm and is unchanged over multiple prior exams, indicative of a benign lesion. Visualized portions of the liver and stomach are grossly unremarkable. Splenule in the left upper quadrant.  Musculoskeletal: No worrisome lytic or sclerotic lesions.  IMPRESSION: 1. No CT findings indicative of sarcoid. 2. Coronary artery calcification.   Electronically Signed   By: Lorin Picket M.D.   On: 02/14/2015 08:11    ASSESSMENT AND PLAN   69 yo female history of OSA, CAD, ICM, LV apical thrombus, chronic systolic HF LVEF 12-45%, admitted with N/V/D. Cardiology is consulted for episode of syncope during admission.   1. Syncope: unexplained etiology. Tele by report was concerning for VT, however on review appears to be artifact per Dr. Harl Bowie. Given prolonged QTc, we advise against use of amiodarone. Discontinue any other potential prolonging QT drugs, discontinued phenergan and Zofran.  Monitor on telemetry. EP eval given syncope in patient with low LVEF? Given supplement for borderline low K and Mg.   2. Abdominal Pain: GI suspects that her cardiomyopathy and HF may be causing low flow mesenteric insufficiency. EF is 30-35%. Recent RHC showed normal CI. Pending CHF recommendation. Continue Coreg and Bidil.   3. Ischemic cardiomyopathy with chronic combined systolic and diastolic heart failure: EF 30-35% 01/2015.  Not on  ACE/ARB due to ARF. Weight early July around 200-210. Her PCWP was 16 in late June, around that time her weight was 208 lbs, she has had poor oral intake with N/V/D. Held metolazone. Current weight of 164lb. As above. Appears euvolemic.   4. Acute on chronic kidney disease, stage IV with metabolic acidosis: Baseline Cr around 3.6. Cr worsen today to 4.73. Lasix held today.  She is on IV lasix 80mg  BID, suspects over diuresis. Not on ACE/ ARB. Consider renal evaluation.   5. Hypotension: Her BP dropped to 88/52 after she received Bidil. Given 50cc bolus fluid. Held bidil today.    Jarrett Soho PA-C Pager [phone]

## 2015-03-03 NOTE — Consult Note (Signed)
[facility] Renal Consultation Note  Requesting MD: Candiss Norse Indication for Consultation: advanced CKD  HPI:  [name] is a 69 y.o. female with PMhx significant for DM, HTN chronic combined systolic and diastolic heart failure, COPD with chronic hypoxia and home oxygen requirement also with recent diagnosis of an LV thrombus on Coumadin as well as chronic kidney disease followed by Dr. Pearson Grippe at [facility]. It appears her baseline creatinine is somewhere between 3 and 4. She's had 2 failed hemodialysis access procedures in the past- and now plan is for AV graft when he is very close to requiring dialysis. Patient was seen in our office just a few days ago with a climbing creatinine and plans were made to follow up closely and possibly get an AV graft. She presented to the emergency department on 7:30 with worsening nausea and vomiting as well as diarrhea.  Her blood pressure was noted to be high, however diagnosis was felt to be consistent with mesenteric ischemia.  Blood pressure over last 24 hours if anything is low and creatinine has climbed from 3.6-4.73 today- there is only 250 mL of urine recorded In the last 24 hours.  She was also noted to have episodes of possible V. tach?  Clinically her abdominal issues are improved.  She is currently getting up from the commode and there is urine in the pot- she is having a lot pf pain related to her hemorrhoids and is not able to completely participate in our conversation.  CREATININE, SER  Date/Time Value Ref Range Status  03/03/2015 03:07 AM 4.73* 0.44 - 1.00 mg/dL Final  03/02/2015 07:33 AM 3.70* 0.44 - 1.00 mg/dL Final  03/01/2015 04:45 AM 3.63* 0.44 - 1.00 mg/dL Final  02/24/2015 04:12 PM 4.09* 0.44 - 1.00 mg/dL Final  02/02/2015 05:17 AM 3.55* 0.44 - 1.00 mg/dL Final  02/01/2015 05:46 AM 3.21* 0.44 - 1.00 mg/dL Final  01/31/2015 04:32 AM 3.16* 0.44 - 1.00 mg/dL Final  01/30/2015 04:05 AM 3.27* 0.44 -  1.00 mg/dL Final  01/29/2015 04:44 AM 3.47* 0.44 - 1.00 mg/dL Final  01/28/2015 08:15 AM 3.55* 0.44 - 1.00 mg/dL Final  01/27/2015 09:30 AM 3.78* 0.44 - 1.00 mg/dL Final  01/26/2015 03:20 AM 4.02* 0.44 - 1.00 mg/dL Final  01/25/2015 06:38 AM 3.89* 0.44 - 1.00 mg/dL Final  01/24/2015 05:49 AM 3.73* 0.44 - 1.00 mg/dL Final  01/22/2015 12:05 PM 2.93* 0.44 - 1.00 mg/dL Final  01/22/2015 01:35 AM 2.78* 0.44 - 1.00 mg/dL Final  01/21/2015 12:20 AM 2.05* 0.44 - 1.00 mg/dL Final  01/20/2015 02:59 PM 2.45* 0.44 - 1.00 mg/dL Final  12/31/2014 09:17 AM 2.27* 0.44 - 1.00 mg/dL Final  12/21/2014 09:25 AM 2.80* 0.44 - 1.00 mg/dL Final  12/16/2014 11:12 AM 3.05* 0.44 - 1.00 mg/dL Final  09/15/2014 12:37 AM 3.50* 0.50 - 1.10 mg/dL Final  09/09/2014 05:00 AM 3.61* 0.50 - 1.10 mg/dL Final  09/08/2014 04:25 AM 3.95* 0.50 - 1.10 mg/dL Final  09/07/2014 03:31 AM 4.49* 0.50 - 1.10 mg/dL Final  09/06/2014 05:43 AM 4.80* 0.50 - 1.10 mg/dL Final  09/05/2014 04:49 AM 4.92* 0.50 - 1.10 mg/dL Final  09/04/2014 04:14 AM 4.45* 0.50 - 1.10 mg/dL Final  09/03/2014 04:28 AM 4.23* 0.50 - 1.10 mg/dL Final  09/02/2014 05:10 AM 3.72* 0.50 - 1.10 mg/dL Final  09/01/2014 03:26 AM 3.40* 0.50 - 1.10 mg/dL Final  08/31/2014 07:07 PM 3.56* 0.50 - 1.10 mg/dL Final  08/31/2014 08:40 AM 3.73* 0.50 - 1.10 mg/dL Final  08/31/2014 01:25 AM 3.60* 0.50 - 1.10 mg/dL Final  08/30/2014 11:55 AM 3.58* 0.50 - 1.10 mg/dL Final  08/10/2014 11:50 AM 3.37* 0.50 - 1.10 mg/dL Final  06/24/2014 09:45 AM 3.0* 0.4 - 1.2 mg/dL Final  05/12/2014 10:05 AM 2.90* 0.50 - 1.10 mg/dL Final  04/30/2014 09:11 AM 3.61* 0.50 - 1.10 mg/dL Final  04/29/2014 04:19 AM 3.82* 0.50 - 1.10 mg/dL Final  04/28/2014 04:24 AM 3.38* 0.50 - 1.10 mg/dL Final  04/27/2014 05:40 AM 3.06* 0.50 - 1.10 mg/dL Final  04/26/2014 11:58 PM 3.06* 0.50 - 1.10 mg/dL Final  03/28/2014 05:03 PM 2.70* 0.50 - 1.10 mg/dL Final  03/28/2014 02:56 PM 2.82* 0.50 - 1.10 mg/dL Final   10/14/2013 03:52 PM 2.92* 0.50 - 1.10 mg/dL Final  08/25/2013 04:45 PM 3.30* 0.50 - 1.10 mg/dL Final  06/18/2013 09:35 AM 4.2* 0.4 - 1.2 mg/dL Final  05/01/2013 03:26 PM 3.08* 0.50 - 1.10 mg/dL Final  04/10/2013 05:35 AM 3.39* 0.50 - 1.10 mg/dL Final  04/09/2013 03:08 PM 3.86* 0.50 - 1.10 mg/dL Final  04/09/2013 06:05 AM 4.11* 0.50 - 1.10 mg/dL Final     PMHx:   Past Medical History  Diagnosis Date  . Essential hypertension, benign   . Coronary atherosclerosis of native coronary artery 01/04/2006    Tiny OM1 70-90% ostial stenosis, no other CAD  . COPD (chronic obstructive pulmonary disease)   . Esophageal reflux   . Dyslipidemia   . Gout   . Colon polyps 2011    Tubular adenomatous polyps  . Spinal stenosis of lumbar region     chronic low back pain.   . Bulging lumbar disc   . Chronic diastolic heart failure   . PNA (pneumonia) 2012    With pleural effusion, requiring thoracentesis  . Chronic bronchitis   . On home oxygen therapy   . OSA (obstructive sleep apnea)     Marginally compliant with CPAP  . Type II diabetes mellitus   . Arthritis   . Diverticulosis   . GERD (gastroesophageal reflux disease)   . Anxiety   . Hyperlipidemia   . Sarcoidosis of lung   . CKD (chronic kidney disease) stage 4, GFR 15-29 ml/min   . IBS (irritable bowel syndrome)   . Cataracts, both eyes   . Headache(784.0)   . Lactic acidosis 01/21/2015  . Chronic CHF (congestive heart failure)     Past Surgical History  Procedure Laterality Date  . Vesicovaginal fistula closure w/  total abdominal hysterectomy  1992  . Bilateral total knee replacements Bilateral Rt=5/04 & Lft=1/09    by DrAlusio  . 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 Zella Richer  . Appendectomy  1957  . Tonsillectomy  1968  . Cholecystectomy  1980's  . Abdominal hysterectomy  1992  . Reduction mammaplasty Bilateral 1980's  . Cardiac catheterization  1990's  . Thoracentesis  2012  . Joint replacement    . Av fistula  placement Left 12/31/2013    Procedure: ARTERIOVENOUS (AV) FISTULA CREATION;  Surgeon: Elam Dutch, MD;  Location: [facility];  Service: Vascular;  Laterality: Left;  . Colonoscopy w/ biopsies and polypectomy    . Av fistula placement Left 03/18/2014    Procedure: ARTERIOVENOUS (AV) FISTULA CREATION- LEFT BRACHIOCEPHALIC ;  Surgeon: Elam Dutch, MD;  Location: [facility];  Service: Vascular;  Laterality: Left;  . Ligation of arteriovenous  fistula Left 03/18/2014    Procedure: LIGATION OF ARTERIOVENOUS  FISTULA- LEFT RADIOCEPHALIC;  Surgeon: Elam Dutch,  MD;  Location: [facility];  Service: Vascular;  Laterality: Left;  . Cardiac catheterization N/A 01/27/2015    Procedure: Right Heart Cath;  Surgeon: Larey Dresser, MD;  Location: [facility];  Service: Cardiovascular;  Laterality: N/A;    Family Hx:  Family History  Problem Relation Age of Onset  . Diabetes Mother   . Heart disease Mother   . Hyperlipidemia Mother   . Varicose Veins Mother   . Breast cancer Maternal Aunt   . Heart disease Father   . Deep vein thrombosis Father   . Hyperlipidemia Father   . Heart disease Sister     before age 49  . Cancer Sister   . Diabetes Sister   . Hyperlipidemia Sister   . Heart attack Sister   . Heart disease Brother   . Diabetes Brother   . Hyperlipidemia Brother   . Heart attack Brother   . Hyperlipidemia Sister     Social History:  reports that she has never smoked. She has never used smokeless tobacco. She reports that she does not drink alcohol or use illicit drugs.  Allergies:  Allergies  Allergen Reactions  . Adhesive [Tape] Other (See Comments)    Blisters   . Avelox [Moxifloxacin Hcl In Nacl] Other (See Comments)    GI upset  . Codeine Other (See Comments)    "crazy"   . Guaifenesin Nausea And Vomiting    Takes Mucinex at home without issue  . Latex Swelling  . Oxycodone Nausea And Vomiting    Takes Percocet at home without issue    Medications: Prior to  Admission medications   Medication Sig Start Date End Date Taking? Authorizing Provider  acetaminophen (TYLENOL) 500 MG tablet Take 1,000 mg by mouth 2 (two) times daily as needed (pain).    Yes Historical Provider, MD  Alpha-D-Galactosidase (BEANO PO) Take 1 tablet by mouth daily.   Yes Historical Provider, MD  Apremilast (OTEZLA) 30 MG TABS Take 30 mg by mouth daily.    Yes Historical Provider, MD  atorvastatin (LIPITOR) 20 MG tablet Take 20 mg by mouth at bedtime.   Yes Historical Provider, MD  carvedilol (COREG) 12.5 MG tablet Take 1 tablet (12.5 mg total) by mouth 2 (two) times daily with a meal. 02/02/15  Yes Ripudeep K Rai, MD  dicyclomine (BENTYL) 10 MG capsule Take 1 tab twice daily. 02/28/15  Yes Amy S Esterwood, PA-C  esomeprazole (NEXIUM) 40 MG capsule Take 1 capsule once daily in the morning. 02/28/15  Yes Amy S Esterwood, PA-C  famotidine (PEPCID) 20 MG tablet Take 1 tablet (20 mg total) by mouth daily. 02/02/15  Yes Ripudeep Krystal Eaton, MD  ferrous sulfate 325 (65 FE) MG tablet Take 325 mg by mouth daily with breakfast.   Yes Historical Provider, MD  fluticasone (FLONASE) 50 MCG/ACT nasal spray Place 2 sprays into both nostrils daily as needed for allergies or rhinitis.    Yes Historical Provider, MD  Fluticasone-Salmeterol (ADVAIR) 250-50 MCG/DOSE AEPB Inhale 1 puff into the lungs 2 (two) times daily. 09/25/14  Yes Noralee Space, MD  furosemide (LASIX) 80 MG tablet Take 2 tablets (160 mg total) by mouth 2 (two) times daily. 02/02/15  Yes Ripudeep Krystal Eaton, MD  insulin lispro (HUMALOG KWIKPEN) 100 UNIT/ML KiwkPen Inject 0.01-0.1 mLs (1-10 Units total) into the skin 3 (three) times daily. Sliding scale CBG 70 - 120: 0 units CBG 121 - 150: 1 unit,  CBG 151 - 200: 2 units,  CBG 201 - 250: 3 units,  CBG 251 - 300: 5 units,  CBG 301 - 350: 7 units,  CBG 351 - 400: 9 units   CBG > 400: 10 units and notify your MD 02/02/15  Yes Ripudeep K Rai, MD  isosorbide-hydrALAZINE (BIDIL) 20-37.5 MG per tablet Take 2  tablets by mouth 3 (three) times daily. Patient taking differently: Take 0.5 tablets by mouth daily.  02/02/15  Yes Ripudeep Krystal Eaton, MD  loperamide (IMODIUM) 2 MG capsule Take 1 capsule (2 mg total) by mouth 2 (two) times daily as needed for diarrhea or loose stools. 02/02/15  Yes Ripudeep Krystal Eaton, MD  metolazone (ZAROXOLYN) 5 MG tablet Take 5 mg by mouth as needed. FOR EDEMA 12/12/14  Yes Historical Provider, MD  Multiple Vitamins-Minerals (MULTIVITAMIN WITH MINERALS) tablet Take 1 tablet by mouth daily.   Yes Historical Provider, MD  ondansetron (ZOFRAN) 4 MG tablet Take 1 tablet (4 mg total) by mouth every 8 (eight) hours as needed for nausea or vomiting. 02/28/15  Yes Amy S Esterwood, PA-C  promethazine (PHENERGAN) 25 MG tablet Take 1 tablet (25 mg total) by mouth every 4 (four) hours as needed for nausea or vomiting. 02/02/15  Yes Ripudeep Krystal Eaton, MD  saccharomyces boulardii (FLORASTOR) 250 MG capsule Take 1 capsule (250 mg total) by mouth 2 (two) times daily. 02/02/15  Yes Ripudeep Krystal Eaton, MD  sodium bicarbonate 650 MG tablet Take 1 tablet (650 mg total) by mouth 2 (two) times daily. 02/02/15  Yes Ripudeep Krystal Eaton, MD  TOUJEO SOLOSTAR 300 UNIT/ML SOPN Inject 40 Units into the skin every morning. 11/21/14  Yes Historical Provider, MD  triamcinolone cream (KENALOG) 0.1 % Apply 1 application topically 2 (two) times daily.   Yes Historical Provider, MD  warfarin (COUMADIN) 1 MG tablet Take 1 tablet (1 mg total) by mouth every evening. Dose to be adjusted Patient taking differently: Take 1.5 mg by mouth every evening. Dose to be adjusted 02/24/15  Yes Sueanne Margarita, MD  albuterol (PROVENTIL HFA;VENTOLIN HFA) 108 (90 BASE) MCG/ACT inhaler Inhale 2 puffs into the lungs every 4 (four) hours as needed for wheezing or shortness of breath.     Historical Provider, MD  albuterol (PROVENTIL) (2.5 MG/3ML) 0.083% nebulizer solution Take 2.5 mg by nebulization every 2 (two) hours as needed for wheezing or shortness of breath.     Historical Provider, MD  hydrocortisone (ANUSOL-HC) 2.5 % rectal cream Place 1 application rectally 4 (four) times daily. 02/28/15   Amy S Esterwood, PA-C    I have reviewed the patient's current medications.  Labs:  Results for orders placed or performed during the hospital encounter of 03/01/15 (from the past 48 hour(s))  Glucose, capillary     Status: Abnormal   Collection Time: 03/01/15  5:29 PM  Result Value Ref Range   Glucose-Capillary 186 (H) 65 - 99 mg/dL  Glucose, capillary     Status: Abnormal   Collection Time: 03/01/15  9:27 PM  Result Value Ref Range   Glucose-Capillary 140 (H) 65 - 99 mg/dL   Comment 1 Notify RN    Comment 2 Document in Chart   CBC     Status: Abnormal   Collection Time: 03/02/15  7:33 AM  Result Value Ref Range   WBC 9.6 4.0 - 10.5 K/uL   RBC 4.88 3.87 - 5.11 MIL/uL   Hemoglobin 13.8 12.0 - 15.0 g/dL   HCT 43.0 36.0 - 46.0 %   MCV 88.1 78.0 -  100.0 fL   MCH 28.3 26.0 - 34.0 pg   MCHC 32.1 30.0 - 36.0 g/dL   RDW 17.4 (H) 11.5 - 15.5 %   Platelets 712 (H) 150 - 400 K/uL  Protime-INR     Status: Abnormal   Collection Time: 03/02/15  7:33 AM  Result Value Ref Range   Prothrombin Time 30.5 (H) 11.6 - 15.2 seconds   INR 2.99 (H) 0.00 - 1.49  Renal function panel     Status: Abnormal   Collection Time: 03/02/15  7:33 AM  Result Value Ref Range   Sodium 138 135 - 145 mmol/L   Potassium 3.1 (L) 3.5 - 5.1 mmol/L   Chloride 108 101 - 111 mmol/L   CO2 20 (L) 22 - 32 mmol/L   Glucose, Bld 102 (H) 65 - 99 mg/dL   BUN 48 (H) 6 - 20 mg/dL   Creatinine, Ser 3.70 (H) 0.44 - 1.00 mg/dL   Calcium 9.0 8.9 - 10.3 mg/dL   Phosphorus 6.2 (H) 2.5 - 4.6 mg/dL   Albumin 2.4 (L) 3.5 - 5.0 g/dL   GFR calc non Af Amer 12 (L) >60 mL/min   GFR calc Af Amer 14 (L) >60 mL/min    Comment: (NOTE) The eGFR has been calculated using the CKD EPI equation. This calculation has not been validated in all clinical situations. eGFR's persistently <60 mL/min signify possible  Chronic Kidney Disease.    Anion gap 10 5 - 15  Glucose, capillary     Status: None   Collection Time: 03/02/15  8:31 AM  Result Value Ref Range   Glucose-Capillary 91 65 - 99 mg/dL  Glucose, capillary     Status: Abnormal   Collection Time: 03/02/15  1:16 PM  Result Value Ref Range   Glucose-Capillary 121 (H) 65 - 99 mg/dL  Glucose, capillary     Status: None   Collection Time: 03/02/15  4:39 PM  Result Value Ref Range   Glucose-Capillary 80 65 - 99 mg/dL   Comment 1 Notify RN    Comment 2 Document in Chart   MRSA PCR Screening     Status: None   Collection Time: 03/02/15  7:01 PM  Result Value Ref Range   MRSA by PCR NEGATIVE NEGATIVE    Comment:        The GeneXpert MRSA Assay (FDA approved for NASAL specimens only), is one component of a comprehensive MRSA colonization surveillance program. It is not intended to diagnose MRSA infection nor to guide or monitor treatment for MRSA infections.   Glucose, capillary     Status: None   Collection Time: 03/02/15  9:29 PM  Result Value Ref Range   Glucose-Capillary 78 65 - 99 mg/dL  Protime-INR     Status: Abnormal   Collection Time: 03/03/15  3:07 AM  Result Value Ref Range   Prothrombin Time 35.7 (H) 11.6 - 15.2 seconds   INR 3.68 (H) 0.00 - 7.32  Basic metabolic panel     Status: Abnormal   Collection Time: 03/03/15  3:07 AM  Result Value Ref Range   Sodium 133 (L) 135 - 145 mmol/L   Potassium 5.1 3.5 - 5.1 mmol/L    Comment: DELTA CHECK NOTED NO VISIBLE HEMOLYSIS    Chloride 107 101 - 111 mmol/L   CO2 19 (L) 22 - 32 mmol/L   Glucose, Bld 78 65 - 99 mg/dL   BUN 52 (H) 6 - 20 mg/dL   Creatinine, Ser 4.73 (H) 0.44 -  1.00 mg/dL   Calcium 8.9 8.9 - 10.3 mg/dL   GFR calc non Af Amer 9 (L) >60 mL/min   GFR calc Af Amer 10 (L) >60 mL/min    Comment: (NOTE) The eGFR has been calculated using the CKD EPI equation. This calculation has not been validated in all clinical situations. eGFR's persistently <60 mL/min  signify possible Chronic Kidney Disease.    Anion gap 7 5 - 15  Magnesium     Status: None   Collection Time: 03/03/15  3:07 AM  Result Value Ref Range   Magnesium 2.2 1.7 - 2.4 mg/dL  Glucose, capillary     Status: None   Collection Time: 03/03/15  8:13 AM  Result Value Ref Range   Glucose-Capillary 86 65 - 99 mg/dL   Comment 1 Notify RN    Comment 2 Document in Chart   Osmolality     Status: None   Collection Time: 03/03/15 10:10 AM  Result Value Ref Range   Osmolality 300 275 - 300 mOsm/kg    Comment: Performed at [facility]  Glucose, capillary     Status: Abnormal   Collection Time: 03/03/15  1:10 PM  Result Value Ref Range   Glucose-Capillary 115 (H) 65 - 99 mg/dL   Comment 1 Notify RN    Comment 2 Document in Chart      ROS:  A comprehensive review of systems was negative except for: Gastrointestinal: positive for abdominal pain and vomiting hemerrhoidal pain   Physical Exam: Filed Vitals:   03/03/15 1200  BP: 116/96  Pulse: 72  Temp: 98 F (36.7 C)  Resp: 12     General: Obese but fairly mobile black female who is in distress secondary to hemorrhoidal pain  HEENT: pupils are equal round reactive to light, extraocular motions are intact, mucous membranes are moist Neck: There is no JVD  Heart: Irregularly irregular  Lungs: Decreased air movement bilaterally  Abdomen: Obese, soft, nontender  Extremities: Trace edema bilaterally  Skin: Warm and dry  Neuro: Alert-  complaining of pain  Assessment/Plan: 69 year old black female with multiple medical issues including 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  She presented with abdominal symptoms felt to possibly be consistent with mesenteric ischemia. She now has acute on chronic renal failure  1.Renal- acute on chronic renal failure.  Patient is a history of advanced chronic kidney disease being very close to dialysis requiring.  She has suffered worsening of her renal function in the  last 24 hours but this is also coincident and time with significantly low blood pressure. Therefore I suspect some ATN secondary to renal hypoperfusion. I put some parameters on her blood pressure medications and her blood pressure seems higher at this time. We will continue to monitor the situation. Hopefully renal function will improve back to baseline. There are no acute indications for dialysis at this time  2. Hypertension/volume  - is not significantly volume overloaded at this time. Is ordered Lasix 80 mg twice a day oral which is half of her home dose to start tomorrow. I have put parameters on her blood pressure medications so as not to drop her blood pressure too low and cardiology has decreased doses 3. Anemia  - is actually not an issue at this time. Because she is having hemorrhoid issues I decided to stop her iron pill 4. GI symptoms- patient states better at this time.  GI has seen. Leading thought on diagnosis was mesenteric ischemia  , A  03/03/2015, 3:05 PM

## 2015-03-03 NOTE — Consult Note (Signed)
CHF CONSULT NOTE  Patient ID: NORVELL URESTE MRN: [id number] DOB/AGE: 67-Mar-1947 67 y.o.  Admit date: 03/01/2015 Reason for Consultation: Unresponsive episodes  HPI: 69 yo with history of OHS/OSA, sarcoidosis, CAD, suspected ischemic cardiomyopathy, LV apical thrombus, and CKD stage IV was re-admitted with abdominal pain, nausea, vomiting. She was admitted initially in 6/16 with abdominal pain, nausea, vomiting, elevated lactate, and AKI on CKD. Echo showed EF decreased to 30-35% with LV apical thrombus. She was started on anticoagulation. RHC was done while she was in the hospital, showing moderate pulmonary hypertension and RV>LV failure. Lexiscan cardiolite showed no ischemia and no definite infarction. She was thought to have infectious colitis versus possible mesenteric cardioembolism versus low flow with mesenteric ischemia. She was diuresed carefully given rise in creatinine. After discharge, she continued to do fairly poorly at home.   She presented back to the [facility] ED 7/30 with complaints of recurrent abdominal pain, nausea/vomiting/diarrhea. She was seen by GI and they suspect that her cardiomyopathy and HF may be causing low flow mesenteric insufficiency, which may be contributing to her symptoms. Cardiology was asked to evaluate. Prior to our initial assessment, she was noted to have 2 episodes of syncope on 7/31.  She felt lightheaded, then passed out.  Telemetry was reviewed: there was no arrhythmia to explain these episodes.  However, BP has been low, so I am concerned that she may have lost responsiveness due to low BP.  Today, creatinine up to 4.73 from 3.7.  Bidil and Lasix were held and she has been given gentle IV fluid.  She is feeling better.  No dyspnea, no abdominal pain.  She is eating. Telemetry has showed no significant arrhythmias.   Review of systems complete and found to be negative unless listed above in HPI  Past Medical History: 1. OHS/OSA: On CPAP at  night and oxygen during the day.  2. Psoriasis 3. Obesity 4. CKD stage IV 5. HTN 6. H/o sarcoidosis: Hypercalcemia, splenic involvement, s/p splenectomy. No definite lung involvement on 9/15 or 7/16 chest CTs.  7. Chronic systolic CHF: Suspect ischemic cardiomyopathy. Echo (6/16) with EF 30-35%, apical thrombus, PA systolic pressure 62 mmHg.  8. LV apical thrombus 9. CAD: LHC (6/07) with 70-90% stenosis small OM. See #7 above, suspect ischemia cardiomyopathy but no repeat cath given CKD. Lexiscan Cardiolite (6/16) with EF 47%, inferolateral fixed defect may be attenuation, no ischemia.  10. GERD 11. Gout 12. Hyperlipidemia 13. 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 PAH: RHC (6/16) with mean RA 13, PA 66/26 mean 39, mean PCWP 16, CI 2.43, PVR 4.9 WU. ?OHS/OSA with pulmonary venous hypertension. Cannot rule out role for sarcoidosis.  CT chest (high resolution) in 7/16 with no evidence for active sarcoidosis.  16. Rectal bleeding: Suspect hemorrhoids  Family History  Problem Relation Age of Onset  . Diabetes Mother   . Heart disease Mother   . Hyperlipidemia Mother   . Varicose Veins Mother   . Breast cancer Maternal Aunt   . Heart disease Father   . Deep vein thrombosis Father   . Hyperlipidemia Father   . Heart disease Sister     before age 92  . Cancer Sister   . Diabetes Sister   . Hyperlipidemia Sister   . Heart attack Sister   . Heart disease Brother   . Diabetes Brother   . Hyperlipidemia Brother   . Heart attack Brother   . Hyperlipidemia Sister     History   Social History  .  Marital Status: Single    Spouse Name: N/A  . Number of Children: 0  . Years of Education: N/A   Occupational History  . Retired   . SECRETARY    Social History Main Topics  . Smoking status: Never Smoker   . Smokeless tobacco: Never Used  . Alcohol Use: No  . Drug Use: No  . Sexual Activity: Not Currently   Other Topics Concern  . Not on file   Social History Narrative   Lives in  [place] alone.  Never married.   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 acetaminophen (TYLENOL) 500 MG tablet Take 1,000 mg by mouth 2 (two) times daily as needed (pain).    02/28/2015 at Unknown time  . Alpha-D-Galactosidase (BEANO PO) Take 1 tablet by mouth daily.   Past Week at Unknown time  . Apremilast (OTEZLA) 30 MG TABS Take 30 mg by mouth daily.    02/28/2015 at Unknown time  . atorvastatin (LIPITOR) 20 MG tablet Take 20 mg by mouth at bedtime.   02/28/2015 at Unknown time  . carvedilol (COREG) 12.5 MG tablet Take 1 tablet (12.5 mg total) by mouth 2 (two) times daily with a meal. 60 tablet 3 02/28/2015 at 2000  . dicyclomine (BENTYL) 10 MG capsule Take 1 tab twice daily. 60 capsule 0 02/28/2015 at Unknown time  . esomeprazole (NEXIUM) 40 MG capsule Take 1 capsule once daily in the morning. 60 capsule 3 02/28/2015 at Unknown time  . famotidine (PEPCID) 20 MG tablet Take 1 tablet (20 mg total) by mouth daily. 30 tablet 3 02/28/2015 at Unknown time  . ferrous sulfate 325 (65 FE) MG tablet Take 325 mg by mouth daily with breakfast.   02/28/2015 at Unknown time  . fluticasone (FLONASE) 50 MCG/ACT nasal spray Place 2 sprays into both nostrils daily as needed for allergies or rhinitis.    Past Week at Unknown time  . Fluticasone-Salmeterol (ADVAIR) 250-50 MCG/DOSE AEPB Inhale 1 puff into the lungs 2 (two) times daily. 180 each 3 Past Week at Unknown time  . furosemide (LASIX) 80 MG tablet Take 2 tablets (160 mg total) by mouth 2 (two) times daily. 120 tablet 2 02/28/2015 at Unknown time  . insulin lispro (HUMALOG KWIKPEN) 100 UNIT/ML KiwkPen Inject 0.01-0.1 mLs (1-10 Units total) into the skin 3 (three) times daily. Sliding scale CBG 70 - 120: 0 units CBG 121 - 150: 1 unit,  CBG 151 - 200: 2 units,  CBG 201 - 250: 3 units,  CBG 251 - 300: 5 units,  CBG 301 - 350: 7 units,  CBG 351 - 400: 9 units   CBG > 400: 10 units and  notify your MD 15 mL 3 02/28/2015 at Unknown time  . isosorbide-hydrALAZINE (BIDIL) 20-37.5 MG per tablet Take 2 tablets by mouth 3 (three) times daily. (Patient taking differently: Take 0.5 tablets by mouth daily. ) 90 tablet 3 02/28/2015 at Unknown time  . loperamide (IMODIUM) 2 MG capsule Take 1 capsule (2 mg total) by mouth 2 (two) times daily as needed for diarrhea or loose stools. 60 capsule 0 Past Month at Unknown time  . metolazone (ZAROXOLYN) 5 MG tablet Take 5 mg by mouth as needed. FOR EDEMA  1 Past Month at Unknown time  . Multiple Vitamins-Minerals (MULTIVITAMIN WITH MINERALS) tablet Take 1 tablet by mouth daily.   02/28/2015 at Unknown time  . ondansetron (ZOFRAN) 4  MG tablet Take 1 tablet (4 mg total) by mouth every 8 (eight) hours as needed for nausea or vomiting. 90 tablet 3 02/28/2015 at Unknown time  . promethazine (PHENERGAN) 25 MG tablet Take 1 tablet (25 mg total) by mouth every 4 (four) hours as needed for nausea or vomiting. 30 tablet 0 02/28/2015 at Unknown time  . saccharomyces boulardii (FLORASTOR) 250 MG capsule Take 1 capsule (250 mg total) by mouth 2 (two) times daily. 60 capsule 3 02/28/2015 at Unknown time  . sodium bicarbonate 650 MG tablet Take 1 tablet (650 mg total) by mouth 2 (two) times daily. 60 tablet 3 02/28/2015 at Unknown time  . TOUJEO SOLOSTAR 300 UNIT/ML SOPN Inject 40 Units into the skin every morning.  3 02/28/2015 at Unknown time  . triamcinolone cream (KENALOG) 0.1 % Apply 1 application topically 2 (two) times daily.   Past Week at Unknown time  . warfarin (COUMADIN) 1 MG tablet Take 1 tablet (1 mg total) by mouth every evening. Dose to be adjusted (Patient taking differently: Take 1.5 mg by mouth every evening. Dose to be adjusted) 45 tablet 1 02/28/2015 at Unknown time  . albuterol (PROVENTIL HFA;VENTOLIN HFA) 108 (90 BASE) MCG/ACT inhaler Inhale 2 puffs into the lungs every 4 (four) hours as needed for wheezing or shortness of breath.    Not used recently  .  albuterol (PROVENTIL) (2.5 MG/3ML) 0.083% nebulizer solution Take 2.5 mg by nebulization every 2 (two) hours as needed for wheezing or shortness of breath.   Not used recently  . hydrocortisone (ANUSOL-HC) 2.5 % rectal cream Place 1 application rectally 4 (four) times daily. 30 g 0 Not yet used   Current Scheduled Meds: . antiseptic oral rinse  7 mL Mouth Rinse q12n4p  . atorvastatin  20 mg Oral QHS  . carvedilol  6.25 mg Oral BID WC  . chlorhexidine  15 mL Mouth Rinse BID  . dicyclomine  10 mg Oral TID AC  . famotidine  20 mg Oral Daily  . feeding supplement  1 Container Oral TID BM  . feeding supplement (GLUCERNA 1.2 CAL)  237 mL Oral TID BM  . ferrous sulfate  325 mg Oral Q breakfast  . [START ON 03/04/2015] furosemide  80 mg Oral BID  . hydrocortisone  1 application Rectal QID  . insulin aspart  0-9 Units Subcutaneous TID WC  . insulin glargine  40 Units Subcutaneous q morning - 10a  . [START ON 03/04/2015] isosorbide-hydrALAZINE  0.5 tablet Oral TID  . mometasone-formoterol  2 puff Inhalation BID  . pantoprazole  40 mg Oral Daily  . saccharomyces boulardii  250 mg Oral BID  . sodium bicarbonate  650 mg Oral BID  . sodium chloride  3 mL Intravenous Q12H  . Warfarin - Pharmacist Dosing Inpatient   Does not apply q1800   Continuous Infusions: . sodium chloride 75 mL/hr at 03/03/15 0803   PRN Meds:.acetaminophen, albuterol, fluticasone, loperamide, morphine injection   Physical exam Blood pressure 116/96, pulse 72, temperature 98 F (36.7 C), temperature source Oral, resp. rate 12, height 5\' 2"  (1.575 m), weight 164 lb 7.4 oz (74.6 kg), SpO2 98 %. General: NAD Neck: Thick, JVP difficult but does not appear significantly elevated, no thyromegaly or thyroid nodule.  Lungs: Slight crackles at bases bilaterally CV: Nondisplaced PMI.  Heart regular S1/S2, no S3/S4, no murmur.  No peripheral edema.  No carotid bruit.  Abdomen: Soft, nontender, no hepatosplenomegaly, no distention.    Skin: Intact without lesions or  rashes.  Neurologic: Alert and oriented x 3.  Psych: Normal affect. Extremities: No clubbing or cyanosis.  HEENT: Normal.   Labs:   Lab Results  Component Value Date   WBC 9.6 03/02/2015   HGB 13.8 03/02/2015   HCT 43.0 03/02/2015   MCV 88.1 03/02/2015   PLT 712* 03/02/2015    Recent Labs Lab 03/01/15 0445  03/03/15 0307  NA 138  < > 133*  K 3.4*  < > 5.1  CL 107  < > 107  CO2 15*  < > 19*  BUN 50*  < > 52*  CREATININE 3.63*  < > 4.73*  CALCIUM 9.7  < > 8.9  PROT 6.8  --   --   BILITOT 0.7  --   --   ALKPHOS 156*  --   --   ALT 14  --   --   AST 20  --   --   GLUCOSE 230*  < > 78  < > = values in this interval not displayed.  Radiology: - CXR: Mild bronchitic changes  EKG:NSR, RBBB  ASSESSMENT AND PLAN: 69 yo with history of OHS/OSA, sarcoidosis, CAD, suspected ischemic cardiomyopathy, LV apical thrombus, and CKD stage IV was re-admitted with abdominal pain, nausea, vomiting. 1. Abdominal pain/nausea/vomiting: She has had an extensive GI workup, possible mesenteric ischemia from low flow.  Bidil and Lasix held today with low BP and rising creatinine.  Think her symptoms are less likely uremia as BUN is not particularly high.  Pain improved today with higher BP.  2. Chronic systolic CHF: EF 62-13% on echo, suspected ischemic cardiomyopathy. No ischemia on Cardiolite. On exam today, she does not appear significantly volume overloaded.  Given low BP and rising creatinine, Bidil and Lasix held today.  She is getting IV fluid.  Of note, her cardiac output was not low on recent RHC.  However, need to promote higher BP with suspected mesenteric ischemia from low flow.  - Agree with holding Lasix today, getting NS @ 75 cc/hr.  Limit IVF to 10 hours then stop.  Will reassess Lasix regimen tomorrow.  - Decrease Bidil to 0.5 tab tid, can restart tomorrow if BP stable.  - Decrease Coreg to 6.25 mg bid.   3. LV thrombus: Continue warfarin.  4.  AKI on CKD: Stage IV, has fistula, has followup with nephrology.  Creatinine up to 4.7 from 3.7.  Holding Lasix and giving gentle fluid, trying to promote higher BP.  She will need Lasix long-term, will need to decide on regimen. Reassess this tomorrow.   5. 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 Suspect PAH component may be due to OHS/OSA.  CT chest showed no active sarcoidosis. Doubt PEs (anticoagulated).  - Continue CPAP and home oxygen.  6. Syncope/unresponsive episodes: x 2 yesterday.  Telemetry reviewed, no significant arrhythmias.  ?fall in BP (has been low on her current medication regimen).  As above, we have cut back on her BP-active meds.   Loralie Champagne 03/03/2015

## 2015-03-04 DIAGNOSIS — I5023 Acute on chronic systolic (congestive) heart failure: Secondary | ICD-10-CM | POA: Insufficient documentation

## 2015-03-04 LAB — BASIC METABOLIC PANEL
ANION GAP: 8 (ref 5–15)
BUN: 53 mg/dL — AB (ref 6–20)
CO2: 18 mmol/L — ABNORMAL LOW (ref 22–32)
Calcium: 8.9 mg/dL (ref 8.9–10.3)
Chloride: 105 mmol/L (ref 101–111)
Creatinine, Ser: 4.91 mg/dL — ABNORMAL HIGH (ref 0.44–1.00)
GFR calc Af Amer: 10 mL/min — ABNORMAL LOW (ref >60)
GFR, EST NON AFRICAN AMERICAN: 8 mL/min — AB (ref >60)
Glucose, Bld: 72 mg/dL (ref 65–99)
Potassium: 5.6 mmol/L — ABNORMAL HIGH (ref 3.5–5.1)
Sodium: 131 mmol/L — ABNORMAL LOW (ref 135–145)

## 2015-03-04 LAB — URINALYSIS, ROUTINE W REFLEX MICROSCOPIC
Bilirubin Urine: NEGATIVE
Glucose, UA: NEGATIVE mg/dL
Hgb urine dipstick: NEGATIVE
KETONES UR: NEGATIVE mg/dL
LEUKOCYTES UA: NEGATIVE
NITRITE: NEGATIVE
SPECIFIC GRAVITY, URINE: 1.008 (ref 1.005–1.030)
Urobilinogen, UA: 0.2 mg/dL (ref 0.0–1.0)
pH: 5 (ref 5.0–8.0)

## 2015-03-04 LAB — GLUCOSE, CAPILLARY
GLUCOSE-CAPILLARY: 125 mg/dL — AB (ref 65–99)
GLUCOSE-CAPILLARY: 118 mg/dL — AB (ref 65–99)
GLUCOSE-CAPILLARY: 139 mg/dL — AB (ref 65–99)

## 2015-03-04 LAB — RAPID URINE DRUG SCREEN, HOSP PERFORMED
Amphetamines: NOT DETECTED
BARBITURATES: NOT DETECTED
BENZODIAZEPINES: NOT DETECTED
Cocaine: NOT DETECTED
OPIATES: POSITIVE — AB
Tetrahydrocannabinol: NOT DETECTED

## 2015-03-04 LAB — URINE MICROSCOPIC-ADD ON

## 2015-03-04 LAB — C DIFFICILE QUICK SCREEN W PCR REFLEX
C DIFFICLE (CDIFF) ANTIGEN: POSITIVE — AB
C Diff toxin: NEGATIVE

## 2015-03-04 LAB — PROTIME-INR
INR: 4.91 — AB (ref 0.00–1.49)
Prothrombin Time: 44.3 seconds — ABNORMAL HIGH (ref 11.6–15.2)

## 2015-03-04 LAB — SODIUM, URINE, RANDOM: Sodium, Ur: 35 mmol/L

## 2015-03-04 LAB — CREATININE, URINE, RANDOM: Creatinine, Urine: 79.5 mg/dL

## 2015-03-04 LAB — OSMOLALITY, URINE: Osmolality, Ur: 242 mOsm/kg — ABNORMAL LOW (ref 390–1090)

## 2015-03-04 LAB — URIC ACID: Uric Acid, Serum: 11.4 mg/dL — ABNORMAL HIGH (ref 2.3–6.6)

## 2015-03-04 MED ORDER — DIPHENHYDRAMINE HCL 25 MG PO CAPS
25.0000 mg | ORAL_CAPSULE | Freq: Three times a day (TID) | ORAL | Status: DC | PRN
  Administered 2015-03-04 – 2015-03-05 (×2): 25 mg via ORAL
  Filled 2015-03-04 (×4): qty 1

## 2015-03-04 MED ORDER — SODIUM POLYSTYRENE SULFONATE 15 GM/60ML PO SUSP
30.0000 g | Freq: Once | ORAL | Status: AC
  Administered 2015-03-04: 30 g via ORAL
  Filled 2015-03-04: qty 120

## 2015-03-04 NOTE — Progress Notes (Signed)
Occupational Therapy Evaluation Patient Details Name: [name] MRN: [id number] DOB: [birth year] Today's Date: 03/04/2015    History of Present Illness Patient is 69 year old female with known history of chronic nausea and vomiting follows with gastroenterologist, also with known history of diabetes, hypertension, stage IV chronic kidney disease , chronic combined systolic and diastolic CHF, recently diagnosed with left ventricular thrombus, COPD with nocturnal oxygen .Presents to [facility] with progressively worsening nausea and nonbloody vomiting, 2 periods of unresponsiveness 7/31   Clinical Impression   PTA, pt receiving HHOT/PT. Pt making good progress and will be ready to D/C home when medically stable. Recommend pt continue with HHOT after D/C. Will follow acutely to facilitate safe D/C home with intermittent S.    Follow Up Recommendations  Home health OT;Supervision - Intermittent    Equipment Recommendations  None recommended by OT    Recommendations for Other Services       Precautions / Restrictions Precautions Precautions: Fall      Mobility Bed Mobility               General bed mobility comments: Pt OOB in recliner  Transfers Overall transfer level: Needs assistance   Transfers: Sit to/from Stand Sit to Stand: Supervision         General transfer comment: good safety awareness    Balance Overall balance assessment: History of Falls (x2 per pt in the last month)                                          ADL Overall ADL's : Needs assistance/impaired     Grooming: Set up   Upper Body Bathing: Set up   Lower Body Bathing: Set up;Supervison/ safety;Sit to/from stand   Upper Body Dressing : Set up   Lower Body Dressing: Supervision/safety;Set up;Sit to/from stand   Toilet Transfer: Supervision/safety           Functional mobility during ADLs: Supervision/safety General ADL Comments: Eval limited due to high  INR. completed sit - stnad with minimal mobility. Pt reports she was receiving HHOT PTA. will have niece at home x 1 more week to assist then sister will be available PRN.      Vision     Perception     Praxis      Pertinent Vitals/Pain Pain Assessment: No/denies pain     Hand Dominance Right   Extremity/Trunk Assessment Upper Extremity Assessment Upper Extremity Assessment: Overall WFL for tasks assessed   Lower Extremity Assessment Lower Extremity Assessment: Overall WFL for tasks assessed   Cervical / Trunk Assessment Cervical / Trunk Assessment: Normal   Communication     Cognition Arousal/Alertness: Awake/alert Behavior During Therapy: WFL for tasks assessed/performed Overall Cognitive Status: Within Functional Limits for tasks assessed                     General Comments       Exercises       Shoulder Instructions      Home Living Family/patient expects to be discharged to:: Private residence Living Arrangements: Alone Available Help at Discharge: Family;Available PRN/intermittently (niece there until net week. Sister can help too) Type of Home: Apartment Home Access: Level entry     Home Layout: One level     Bathroom Shower/Tub: Tub/shower unit Shower/tub characteristics: Curtain Biochemist, clinical: Handicapped height Bathroom Accessibility: Yes How Accessible: Accessible via  wheelchair;Accessible via walker Home Equipment: Tub bench;Grab bars - toilet;Walker - 2 wheels;Walker - standard;Bedside commode;Wheelchair - Scientist, physiological: Reacher        Prior Functioning/Environment Level of Independence: Independent with assistive device(s) (using cane)             OT Diagnosis: Generalized weakness   OT Problem List: Decreased strength;Decreased activity tolerance;Impaired balance (sitting and/or standing);Obesity   OT Treatment/Interventions: Self-care/ADL training;Therapeutic exercise;Energy  conservation;DME and/or AE instruction;Therapeutic activities;Balance training;Patient/family education    OT Goals(Current goals can be found in the care plan section) Acute Rehab OT Goals Patient Stated Goal: to get stronger adn be independent OT Goal Formulation: With patient Time For Goal Achievement: 03/18/15 Potential to Achieve Goals: Good  OT Frequency: Min 2X/week   Barriers to D/C:            Co-evaluation              End of Session Nurse Communication: Mobility status  Activity Tolerance: Patient tolerated treatment well Patient left: in chair;with call bell/phone within reach   Time: 5056-9794 OT Time Calculation (min): 16 min Charges:  OT General Charges $OT Visit: 1 Procedure OT Evaluation $Initial OT Evaluation Tier I: 1 Procedure G-Codes:    Tomeka Kantner,HILLARY 03-06-15, 1:51 PM   [facility], OTR/L  [phone] 2015/03/06

## 2015-03-04 NOTE — Progress Notes (Addendum)
Paged dr Candiss Norse regarding cdiff results will keep on contact precautions, no new orders rec'd.

## 2015-03-04 NOTE — Progress Notes (Signed)
Advanced Heart Failure Rounding Note   Subjective:    Feeling ok. Denies SOB or CP.  Says her stomach feels "OK", denies any abd pain currently..  Still feels a little "wobbly" when going from lying to sitting.  Says her R foot is hurting her, feels similar to gout flairs in the past.  Up 1.6 L yesterday.  Weight shows up 6 lbs.  Renal function continues to worsen. 3.7 -> 4.73 -> 4.9  Objective:   Weight Range: 170 lb 6.7 oz (77.3 kg) Body mass index is 31.16 kg/(m^2).   Vital Signs:   Temp:  [97.8 F (36.6 C)-98.4 F (36.9 C)] 97.9 F (36.6 C) (08/02 0725) Pulse Rate:  [69-85] 79 (08/02 0725) Resp:  [12-23] 22 (08/02 0725) BP: (88-153)/(52-96) 124/52 mmHg (08/02 0725) SpO2:  [94 %-100 %] 100 % (08/02 0737) Weight:  [170 lb 6.7 oz (77.3 kg)] 170 lb 6.7 oz (77.3 kg) (08/02 0340) Last BM Date: 03/02/15  Weight change: Filed Weights   03/02/15 1122 03/03/15 0320 03/04/15 0340  Weight: 164 lb 0.4 oz (74.4 kg) 164 lb 7.4 oz (74.6 kg) 170 lb 6.7 oz (77.3 kg)    Intake/Output:   Intake/Output Summary (Last 24 hours) at 03/04/15 0919 Last data filed at 03/04/15 0725  Gross per 24 hour  Intake 1692.5 ml  Output     50 ml  Net 1642.5 ml     Physical Exam: General: NAD Neck: Thick, JVP 8-9 cm but difficult 2/2 girth, no thyromegaly or thyroid nodule.  Lungs: Slight crackles at bases bilaterally CV: Nondisplaced PMI. Heart regular S1/S2, no S3/S4, no murmur. No peripheral edema. No carotid bruit.  Abdomen: Soft, NT, ND. No HSM  Skin: Intact without lesions or rashes.  Neurologic: Alert and oriented x 3.  Psych: Normal affect. Extremities: No clubbing or cyanosis.  HEENT: Normal.    Telemetry: Sinus 70s with PVCs  Labs: CBC  Recent Labs  03/02/15 0733  WBC 9.6  HGB 13.8  HCT 43.0  MCV 88.1  PLT 675*   Basic Metabolic Panel  Recent Labs  03/01/15 1154  03/02/15 0733 03/03/15 0307 03/04/15 0248  NA  --   < > 138 133* 131*  K  --   < > 3.1* 5.1  5.6*  CL  --   < > 108 107 105  CO2  --   < > 20* 19* 18*  GLUCOSE  --   < > 102* 78 72  BUN  --   < > 48* 52* 53*  CALCIUM  --   < > 9.0 8.9 8.9  MG 1.6*  --   --  2.2  --   PHOS 4.6  --  6.2*  --   --   < > = values in this interval not displayed. Liver Function Tests  Recent Labs  03/02/15 0733  ALBUMIN 2.4*   No results for input(s): LIPASE, AMYLASE in the last 72 hours. Cardiac Enzymes No results for input(s): CKTOTAL, CKMB, CKMBINDEX, TROPONINI in the last 72 hours.  BNP: BNP (last 3 results)  Recent Labs  01/21/15 1830 02/02/15 0517 02/24/15 1612  BNP 1056.8* 2924.8* 475.5*    ProBNP (last 3 results)  Recent Labs  04/26/14 2358 06/24/14 0945  PROBNP 3725.0* 324.0*     D-Dimer No results for input(s): DDIMER in the last 72 hours. Hemoglobin A1C No results for input(s): HGBA1C in the last 72 hours. Fasting Lipid Panel No results for input(s): CHOL, HDL, LDLCALC, TRIG, CHOLHDL,  LDLDIRECT in the last 72 hours. Thyroid Function Tests  Recent Labs  03/01/15 1154  TSH 0.895    Other results:     Imaging/Studies:   No results found.  Latest Echo  Latest Cath   Medications:     Scheduled Medications: . antiseptic oral rinse  7 mL Mouth Rinse q12n4p  . atorvastatin  20 mg Oral QHS  . carvedilol  6.25 mg Oral BID WC  . chlorhexidine  15 mL Mouth Rinse BID  . dicyclomine  10 mg Oral TID AC  . famotidine  20 mg Oral Daily  . feeding supplement  1 Container Oral TID BM  . feeding supplement (GLUCERNA 1.2 CAL)  237 mL Oral TID BM  . feeding supplement (PRO-STAT SUGAR FREE 64)  30 mL Oral BID WC  . furosemide  80 mg Oral BID  . hydrocortisone  1 application Rectal QID  . insulin aspart  0-9 Units Subcutaneous TID WC  . insulin glargine  40 Units Subcutaneous q morning - 10a  . isosorbide-hydrALAZINE  0.5 tablet Oral TID  . mometasone-formoterol  2 puff Inhalation BID  . multivitamin with minerals  1 tablet Oral Daily  . pantoprazole  40  mg Oral Daily  . saccharomyces boulardii  250 mg Oral BID  . sodium bicarbonate  650 mg Oral BID  . sodium chloride  3 mL Intravenous Q12H  . sodium polystyrene  30 g Oral Once  . Warfarin - Pharmacist Dosing Inpatient   Does not apply q1800     Infusions:     PRN Medications:  acetaminophen, albuterol, diphenhydrAMINE, fluticasone, loperamide, morphine injection   Assessment/Plan  69 yo with history of OHS/OSA, sarcoidosis, CAD, suspected ischemic cardiomyopathy, LV apical thrombus, and CKD stage IV was re-admitted with abdominal pain, nausea, vomiting. 1. Abdominal pain/nausea/vomiting: She has had an extensive GI workup, possible mesenteric ischemia from low flow. Bidil and Lasix held yesterday with low BP and rising creatinine. Think her symptoms are less likely uremia as BUN is not particularly high. Pain improved with higher BP.  2. Chronic systolic CHF: EF 18-56% on echo, suspected ischemic cardiomyopathy. No ischemia on Cardiolite. Suspect mild volume overload now with JVP 8-9 cm.  - Of note, her cardiac output was not low on recent RHC. However, need to promote higher BP with suspected mesenteric ischemia from low flow.  - Will place on po Lasix today at 80 mg bid, had been taking up to 160 mg bid at home.  - Place back on Bidil to 0.5 tab tid, with low diastolic.  May increase once BP more stable. - Continue Coreg 6.25 mg bid.  3. LV thrombus: Continue warfarin.  4. AKI on CKD: Stage IV, has fistula, has followup with nephrology. Possibly developed AKI from hypoperfusion/ATN in the setting of low BP.  Creatinine up to 4.9 from 3.7. Will follow BP and Cr with resuming of lasix. 5. 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 Suspect PAH component may be due to OHS/OSA. CT chest showed no active sarcoidosis. Doubt PEs (anticoagulated).  - Continue CPAP and home oxygen.  6. Syncope/unresponsive episodes: x 2 03/02/15. Telemetry  reviewed, no arrhythmias to explain episode. ?fall in BP (has been low on her current medication regimen). As above, we have cut back on her BP-active meds.  7. Hx of Gout - Will check uric acid level with foot pain.  Says feels like her previous episodes of gout.  Length of Stay: 3  Satira Mccallum Tillery PA-C 03/04/2015, 9:19 AM  Advanced Heart Failure Team Pager [phone] (M-F; 7a - 4p)  Please contact [facility] for night-coverage after hours (4p -7a ) and weekends on [email or web address]  Patient seen with PA, agree with the above note.  Slight increase in creatinine. BP-active meds cut back, BP better.  Suspect possible ATN related to hypoperfusion with low BP.  She now has some volume on board, will restart Lasix 80 mg bid and follow creatinine closely.    She is mostly in NSR, but watching telemetry noted to have short runs of possible rate-controlled atrial fibrillation.  She is already on warfarin.  Will follow.   Loralie Champagne 03/04/2015 4:54 PM

## 2015-03-04 NOTE — Progress Notes (Signed)
C.diff antigen Positive, C.diff toxin Negative.  Patient should remain on Enteric Precautions until discharged. Luther Hearing RN BSN Infection Prevention

## 2015-03-04 NOTE — Progress Notes (Signed)
NURSING PROGRESS NOTE  SALAYAH MEARES [id number] Transfer Data: 03/04/2015 8:56 PM Attending Provider: Thurnell Lose, MD ERD:EYCX, CAMMIE, MD Code Status: Full   [name] is a 69 y.o. female patient [facility]  -No acute distress noted.  -No complaints of shortness of breath.  -No complaints of chest pain.   Cardiac Monitoring: Box # 26 in place.    Blood pressure 136/70, pulse 83, temperature 98.1 F (36.7 C), temperature source Oral, resp. rate 15, height 5\' 2"  (1.575 m), weight 77.3 kg (170 lb 6.7 oz), SpO2 96 %.   Allergies:  Adhesive; Avelox; Codeine; Guaifenesin; Latex; and Oxycodone  Past Medical History:   has a past medical history of Essential hypertension, benign; Coronary atherosclerosis of native coronary artery (01/04/2006); COPD (chronic obstructive pulmonary disease); Esophageal reflux; Dyslipidemia; Gout; Colon polyps (2011); Spinal stenosis of lumbar region; Bulging lumbar disc; Chronic diastolic heart failure; PNA (pneumonia) (2012); Chronic bronchitis; On home oxygen therapy; OSA (obstructive sleep apnea); Type II diabetes mellitus; Arthritis; Diverticulosis; GERD (gastroesophageal reflux disease); Anxiety; Hyperlipidemia; Sarcoidosis of lung; CKD (chronic kidney disease) stage 4, GFR 15-29 ml/min; IBS (irritable bowel syndrome); Cataracts, both eyes; Headache(784.0); Lactic acidosis (01/21/2015); and Chronic CHF (congestive heart failure).  Past Surgical History:   has past surgical history that includes VESICOVAGINAL FISTULA CLOSURE W/  total abdominal hysterectomy (1992); BILATERAL TOTAL KNEE REPLACEMENTS (Bilateral, Rt=5/04 & Lft=1/09); OPEN SPLENECTOMY (09/2010); Appendectomy (1957); Tonsillectomy (1968); Cholecystectomy (1980's); Abdominal hysterectomy (1992); Reduction mammaplasty (Bilateral, 1980's); Cardiac catheterization (1990's); Thoracentesis (2012); Joint replacement; AV fistula placement (Left, 12/31/2013); Colonoscopy w/ biopsies and  polypectomy; AV fistula placement (Left, 03/18/2014); Ligation of arteriovenous  fistula (Left, 03/18/2014); and Cardiac catheterization (N/A, 01/27/2015).  Social History:   reports that she has never smoked. She has never used smokeless tobacco. She reports that she does not drink alcohol or use illicit drugs.  Skin: clean, dr, intact  Patient/Family orientated to room. Information packet given to patient/family. Admission inpatient armband information verified with patient/family to include name and date of birth and placed on patient arm. Side rails up x 2, fall assessment and education completed with patient/family. Patient/family able to verbalize understanding of risk associated with falls and verbalized understanding to call for assistance before getting out of bed. Call light within reach. Patient/family able to voice and demonstrate understanding of unit orientation instructions.    Will continue to evaluate and treat per MD orders.

## 2015-03-04 NOTE — Progress Notes (Addendum)
Patient Demographics:    [name], is a 69 y.o. female, DOB - 22-Aug-1945, MEQ:[id number]  Admit date - 03/01/2015   Admitting Physician Theodis Blaze, MD  Outpatient Primary MD for the patient is FULP, CAMMIE, MD  LOS - 3   Chief Complaint  Patient presents with  . Abdominal Pain      Summary  69 year old female with history of combined chronic systolic and diastolic CHF EF 29-79% recently discharged from this hospital from the CHF service, history of chronic nausea vomiting and abdominal pain, COPD, DM type 2 insulin-dependent. Who was readmitted to the hospital within a few days of her discharge from the cardiology service for reoccurrence of abdominal pain nausea vomiting. She was seen by GI and was thought to have abdominal pain due to low flow state from CHF.   She has been treated with supportive care with bowel rest and IV fluids with good results, she is now symptom-free and diet is being advanced, she has chronic kidney disease stage III and during her hospital stay has developed acute renal failure for which renal is on board. She also developed questionable V. tach with syncope during this hospitalization for which cardiology is following her closely. V. tach was thought to be artifact by cardiology.  Once her renal function has stabilized she could be discharged home could be in the next 2-3 days.    Subjective:    [place] today has, No headache, No chest pain, +ve epigastric abdominal pain - No Nausea, No new weakness tingling or numbness, No Cough - SOB. Overall feels better today.   Assessment  & Plan :     1. Abd Pain - GI following, stool studies pending, on PPI and Zofran for supportive care along with Bentyl. Per GI abdominal pain could be due to mesenteric ischemia  from low flow state, cardiology following as well. Clinically better will advance diet and monitor.   2. Ischemic cardiomyopathy with chronic combined systolic and diastolic heart failure EF around 30% on echocardiogram done in June 2016. Recently discharged from the hospital from CHF service, from fluid standpoint she appears to be compensated or slightly on the dry side, she is currently on Coreg. Cardiology on board and adjusting heart medications. Blood pressure slightly on the lower side. Renal monitoring Lasix dose and fluids.   3. Syncopal episodes 2 with runs of V. tach on 03/02/2015. EF 30%, she did have borderline low magnesium and low potassium both of which have been replaced. Continue Coreg. Per cardiology more likely to be artifact than V. tach, continue to monitor on telemetry. Not on ACE/ARB due to ARF. On Coreg which will be continued. Cardiology following.   4. Left mural thrombus. Recently diagnosed. On Coumadin continue. INR slightly high on 03/04/2015. Pharmacy on board monitoring.   5. Hypertensive urgency on admission subsequently became hypotensive. Blood pressure stable have adjusted her Coreg, hydralazine and Imdur dose.   6. 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 Baseline creatinine around 3.6 continue, appears to be a combination of transient hypotension and over diuresis, hydrated her on 03/03/2015 and had her Lasix, she is on oral bicarbonate supplementation continue. Renal is following and managing fluids and diuretics.   7. Hyperkalemia.  Given Kayexalate and repeat BMP in the morning.     8.DM type II. Continue present regimen.  Lab Results  Component Value Date   HGBA1C 7.1* 01/21/2015    CBG (last 3)   Recent Labs  03/03/15 1310 03/03/15 1526 03/03/15 2122  GLUCAP 115* 98 93      Code Status : Full  Family Communication  : None   Disposition Plan  : Telemetry  Consults  :  Cards, GI, renal  Procedures  :  Renal  ultrasound. Unremarkable  DVT Prophylaxis  :  Coumadin  Lab Results  Component Value Date   INR 4.91* 03/04/2015   INR 3.68* 03/03/2015   INR 2.99* 03/02/2015     Lab Results  Component Value Date   PLT 712* 03/02/2015    Inpatient Medications  Scheduled Meds: . antiseptic oral rinse  7 mL Mouth Rinse q12n4p  . atorvastatin  20 mg Oral QHS  . carvedilol  6.25 mg Oral BID WC  . chlorhexidine  15 mL Mouth Rinse BID  . dicyclomine  10 mg Oral TID AC  . famotidine  20 mg Oral Daily  . feeding supplement  1 Container Oral TID BM  . feeding supplement (GLUCERNA 1.2 CAL)  237 mL Oral TID BM  . feeding supplement (PRO-STAT SUGAR FREE 64)  30 mL Oral BID WC  . furosemide  80 mg Oral BID  . hydrocortisone  1 application Rectal QID  . insulin aspart  0-9 Units Subcutaneous TID WC  . insulin glargine  40 Units Subcutaneous q morning - 10a  . isosorbide-hydrALAZINE  0.5 tablet Oral TID  . mometasone-formoterol  2 puff Inhalation BID  . multivitamin with minerals  1 tablet Oral Daily  . pantoprazole  40 mg Oral Daily  . saccharomyces boulardii  250 mg Oral BID  . sodium bicarbonate  650 mg Oral BID  . sodium chloride  3 mL Intravenous Q12H  . sodium polystyrene  30 g Oral Once  . Warfarin - Pharmacist Dosing Inpatient   Does not apply q1800   Continuous Infusions:   PRN Meds:.acetaminophen, albuterol, diphenhydrAMINE, fluticasone, loperamide, morphine injection  Antibiotics  :   Anti-infectives    None        Objective:   Filed Vitals:   03/04/15 0010 03/04/15 0340 03/04/15 0725 03/04/15 0737  BP: 122/52 131/53 124/52   Pulse: 69 72 79   Temp:  98.4 F (36.9 C) 97.9 F (36.6 C)   TempSrc:  Oral Oral   Resp:  15 22   Height:      Weight:  77.3 kg (170 lb 6.7 oz)    SpO2: 100% 99% 100% 100%    Wt Readings from Last 3 Encounters:  03/04/15 77.3 kg (170 lb 6.7 oz)  02/28/15 77.111 kg (170 lb)  02/25/15 79.379 kg (175 lb)     Intake/Output Summary (Last 24  hours) at 03/04/15 0945 Last data filed at 03/04/15 0725  Gross per 24 hour  Intake 1692.5 ml  Output     50 ml  Net 1642.5 ml     Physical Exam  Awake Alert, Oriented X 3, No new F.N deficits, Normal affect [place].AT,PERRAL Supple Neck,No JVD, No cervical lymphadenopathy appriciated.  Symmetrical Chest wall movement, Good air movement bilaterally, CTAB RRR,No Gallops,Rubs or new Murmurs, No Parasternal Heave +ve B.Sounds, Abd Soft, No tenderness, No organomegaly appriciated, No rebound - guarding or rigidity. No Cyanosis, Clubbing or edema, No new Rash or bruise  Data Review:   Micro Results Recent Results (from the past 240 hour(s))  MRSA PCR Screening     Status: None   Collection Time: 03/02/15  7:01 PM  Result Value Ref Range Status   MRSA by PCR NEGATIVE NEGATIVE Final    Comment:        The GeneXpert MRSA Assay (FDA approved for NASAL specimens only), is one component of a comprehensive MRSA colonization surveillance program. It is not intended to diagnose MRSA infection nor to guide or monitor treatment for MRSA infections.     Radiology Reports Dg Chest 2 View  02/24/2015   CLINICAL DATA:  Short of breath and weakness  EXAM: CHEST  2 VIEW  COMPARISON:  CT 02/14/2015  FINDINGS: Normal mediastinum and cardiac silhouette. Chronic central bronchitic markings. Normal pulmonary vasculature. No effusion, infiltrate, or pneumothorax.  IMPRESSION: Mild bronchitic markings.  No acute findings.   Electronically Signed   By: Suzy Bouchard M.D.   On: 02/24/2015 17:02   Ct Chest High Resolution  02/14/2015   CLINICAL DATA:  Sarcoid. Off prednisone, on Coumadin. Evaluate for disease progression. Chronic shortness of breath.  EXAM: CT CHEST WITHOUT CONTRAST  TECHNIQUE: Multidetector CT imaging of the chest was performed following the standard protocol without intravenous contrast. High resolution imaging of the lungs, as well as inspiratory and expiratory imaging, was  performed.  COMPARISON:  04/28/2014 and 10/04/2010.  FINDINGS: Mediastinum/Nodes: No pathologically enlarged mediastinal or axillary lymph nodes. Hilar regions are difficult to definitively evaluate without IV contrast. Atherosclerotic calcification of the arterial vasculature, including coronary arteries. Heart is mildly enlarged. No pericardial effusion.  Lungs/Pleura: Mild linear scarring and volume loss in both lower lobes, unchanged. No peribronchovascular nodularity. No subpleural reticulation, traction bronchiectasis/ bronchiolectasis, ground-glass, architectural distortion or honeycombing. No air trapping. Lungs are otherwise clear. No pleural fluid. Airway is unremarkable.  Upper abdomen: Partially calcified low-attenuation lesion in the posterior dome of the liver measures 1.9 cm and is unchanged over multiple prior exams, indicative of a benign lesion. Visualized portions of the liver and stomach are grossly unremarkable. Splenule in the left upper quadrant.  Musculoskeletal: No worrisome lytic or sclerotic lesions.  IMPRESSION: 1. No CT findings indicative of sarcoid. 2. Coronary artery calcification.   Electronically Signed   By: Lorin Picket M.D.   On: 02/14/2015 08:11     CBC  Recent Labs Lab 03/01/15 0445 03/02/15 0733  WBC 10.1 9.6  HGB 14.0 13.8  HCT 41.4 43.0  PLT 809* 712*  MCV 85.9 88.1  MCH 29.0 28.3  MCHC 33.8 32.1  RDW 16.7* 17.4*    Chemistries   Recent Labs Lab 03/01/15 0445 03/01/15 1154 03/02/15 0733 03/03/15 0307 03/04/15 0248  NA 138  --  138 133* 131*  K 3.4*  --  3.1* 5.1 5.6*  CL 107  --  108 107 105  CO2 15*  --  20* 19* 18*  GLUCOSE 230*  --  102* 78 72  BUN 50*  --  48* 52* 53*  CREATININE 3.63*  --  3.70* 4.73* 4.91*  CALCIUM 9.7  --  9.0 8.9 8.9  MG  --  1.6*  --  2.2  --   AST 20  --   --   --   --   ALT 14  --   --   --   --   ALKPHOS 156*  --   --   --   --   BILITOT 0.7  --   --   --   --     ------------------------------------------------------------------------------------------------------------------  estimated creatinine clearance is 10.9 mL/min (by C-G formula based on Cr of 4.91). ------------------------------------------------------------------------------------------------------------------ No results for input(s): HGBA1C in the last 72 hours. ------------------------------------------------------------------------------------------------------------------ No results for input(s): CHOL, HDL, LDLCALC, TRIG, CHOLHDL, LDLDIRECT in the last 72 hours. ------------------------------------------------------------------------------------------------------------------  Recent Labs  03/01/15 1154  TSH 0.895   ------------------------------------------------------------------------------------------------------------------ No results for input(s): VITAMINB12, FOLATE, FERRITIN, TIBC, IRON, RETICCTPCT in the last 72 hours.  Coagulation profile  Recent Labs Lab 02/26/15 03/01/15 1154 03/02/15 0733 03/03/15 0307 03/04/15 0248  INR 2.3 2.29* 2.99* 3.68* 4.91*    No results for input(s): DDIMER in the last 72 hours.  Cardiac Enzymes No results for input(s): CKMB, TROPONINI, MYOGLOBIN in the last 168 hours.  Invalid input(s): CK ------------------------------------------------------------------------------------------------------------------ Invalid input(s): POCBNP   Time Spent in minutes  35   Ethelbert Thain K M.D on 03/04/2015 at 9:45 AM  Between 7am to 7pm - Pager - [phone]  After 7pm go to [email or web address] - password [facility]  [facility] -  Office  [phone]

## 2015-03-04 NOTE — Progress Notes (Signed)
Subjective:  She has no complaints this AM but acknowledges that her UOP is down- U/A was clean- renal function a little worse with higher K this AM- tolerated liquid diet  Objective Vital signs in last 24 hours: Filed Vitals:   03/04/15 0010 03/04/15 0340 03/04/15 0725 03/04/15 0737  BP: 122/52 131/53 124/52   Pulse: 69 72 79   Temp:  98.4 F (36.9 C) 97.9 F (36.6 C)   TempSrc:  Oral Oral   Resp:  15 22   Height:      Weight:  77.3 kg (170 lb 6.7 oz)    SpO2: 100% 99% 100% 100%   Weight change: 2.9 kg (6 lb 6.3 oz)  Intake/Output Summary (Last 24 hours) at 03/04/15 0758 Last data filed at 03/04/15 0725  Gross per 24 hour  Intake 1816.25 ml  Output     50 ml  Net 1766.25 ml    Assessment/Plan: 69 year old black female with multiple medical issues including 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 She presented with abdominal symptoms felt to possibly be consistent with mesenteric ischemia. She now has acute on chronic renal failure  1.Renal- acute on chronic renal failure. Patient is a history of advanced chronic kidney disease being very close to dialysis requiring. She has suffered worsening of her renal function this hospitalization that is coincident in time with significantly low blood pressure. Therefore I suspect some ATN secondary to renal hypoperfusion. I put some parameters on her blood pressure medications and her blood pressure seems higher at this time. We will continue to monitor the situation. Hopefully renal function will start to plateau and improve. There are no acute indications for dialysis at this time - given kaexylate for high Kbut K was low earlier in hosp so hopefully will improve.  I will check labs again this PM.  I have told her there is a chance that we would need to do dialysis this admit 2. Hypertension/volume - is not significantly volume overloaded at this time. Is ordered Lasix 80 mg twice a day oral which is half of her home dose to  start today. I agree given sodium is dropping. I have put parameters on her blood pressure medications so as not to drop her blood pressure too low and cardiology has decreased doses 3. Anemia - is actually not an issue at this time. Because she is having hemorrhoid issues I decided to stop her iron pill 4. GI symptoms- patient states better at this time. GI has seen. Leading thought on diagnosis was mesenteric ischemia   Chinaza Rooke A    Labs: Basic Metabolic Panel:  Recent Labs Lab 03/01/15 1154 03/02/15 0733 03/03/15 0307 03/04/15 0248  NA  --  138 133* 131*  K  --  3.1* 5.1 5.6*  CL  --  108 107 105  CO2  --  20* 19* 18*  GLUCOSE  --  102* 78 72  BUN  --  48* 52* 53*  CREATININE  --  3.70* 4.73* 4.91*  CALCIUM  --  9.0 8.9 8.9  PHOS 4.6 6.2*  --   --    Liver Function Tests:  Recent Labs Lab 03/01/15 0445 03/02/15 0733  AST 20  --   ALT 14  --   ALKPHOS 156*  --   BILITOT 0.7  --   PROT 6.8  --   ALBUMIN 2.7* 2.4*    Recent Labs Lab 03/01/15 0445  LIPASE 24   No results for input(s): AMMONIA  in the last 168 hours. CBC:  Recent Labs Lab 03/01/15 0445 03/02/15 0733  WBC 10.1 9.6  HGB 14.0 13.8  HCT 41.4 43.0  MCV 85.9 88.1  PLT 809* 712*   Cardiac Enzymes: No results for input(s): CKTOTAL, CKMB, CKMBINDEX, TROPONINI in the last 168 hours. CBG:  Recent Labs Lab 03/02/15 2129 03/03/15 0813 03/03/15 1310 03/03/15 1526 03/03/15 2122  GLUCAP 78 86 115* 98 93    Iron Studies: No results for input(s): IRON, TIBC, TRANSFERRIN, FERRITIN in the last 72 hours. Studies/Results: No results found. Medications: Infusions:    Scheduled Medications: . antiseptic oral rinse  7 mL Mouth Rinse q12n4p  . atorvastatin  20 mg Oral QHS  . carvedilol  6.25 mg Oral BID WC  . chlorhexidine  15 mL Mouth Rinse BID  . dicyclomine  10 mg Oral TID AC  . famotidine  20 mg Oral Daily  . feeding supplement  1 Container Oral TID BM  . feeding supplement  (GLUCERNA 1.2 CAL)  237 mL Oral TID BM  . feeding supplement (PRO-STAT SUGAR FREE 64)  30 mL Oral BID WC  . furosemide  80 mg Oral BID  . hydrocortisone  1 application Rectal QID  . insulin aspart  0-9 Units Subcutaneous TID WC  . insulin glargine  40 Units Subcutaneous q morning - 10a  . isosorbide-hydrALAZINE  0.5 tablet Oral TID  . mometasone-formoterol  2 puff Inhalation BID  . multivitamin with minerals  1 tablet Oral Daily  . pantoprazole  40 mg Oral Daily  . saccharomyces boulardii  250 mg Oral BID  . sodium bicarbonate  650 mg Oral BID  . sodium chloride  3 mL Intravenous Q12H  . sodium polystyrene  30 g Oral Once  . Warfarin - Pharmacist Dosing Inpatient   Does not apply q1800    have reviewed scheduled and prn medications.  Physical Exam: General: NAD- conversive Heart: RRR Lungs: mostly clear Abdomen: soft, obese, non tender Extremities: minimal edema Dialysis Access: none yet    03/04/2015,7:58 AM  LOS: 3 days

## 2015-03-04 NOTE — Care Management Important Message (Signed)
Important Message  Patient Details  Name: [name] MRN: [id number] Date of Birth: May 06, 1946   Medicare Important Message Given:  [facility] notification given    Nathen May 03/04/2015, 1:13 [place] Message  Patient Details  Name: [name] MRN: [id number] Date of Birth: Jan 23, 1946   Medicare Important Message Given:  Yes-second notification given    Nathen May 03/04/2015, 1:13 PM

## 2015-03-04 NOTE — Progress Notes (Signed)
ANTICOAGULATION CONSULT NOTE - Follow Up Consult  Pharmacy Consult for Coumadin Indication: L mural thrombus  Allergies  Allergen Reactions  . Adhesive [Tape] Other (See Comments)    Blisters   . Avelox [Moxifloxacin Hcl In Nacl] Other (See Comments)    GI upset  . Codeine Other (See Comments)    "crazy"   . Guaifenesin Nausea And Vomiting    Takes Mucinex at home without issue  . Latex Swelling  . Oxycodone Nausea And Vomiting    Takes Percocet at home without issue    Patient Measurements: Height: 5\' 2"  (157.5 cm) Weight: 170 lb 6.7 oz (77.3 kg) IBW/kg (Calculated) : 50.1  Vital Signs: Temp: 97.9 F (36.6 C) (08/02 0725) Temp Source: Oral (08/02 0725) BP: 124/52 mmHg (08/02 0725) Pulse Rate: 79 (08/02 0725)  Labs:  Recent Labs  03/02/15 0733 03/03/15 0307 03/04/15 0248  HGB 13.8  --   --   HCT 43.0  --   --   PLT 712*  --   --   LABPROT 30.5* 35.7* 44.3*  INR 2.99* 3.68* 4.91*  CREATININE 3.70* 4.73* 4.91*   Estimated Creatinine Clearance: 10.9 mL/min (by C-G formula based on Cr of 4.91).  Assessment: 69 yo W on warfarin prior to admission for left mural thrombus to continue on warfarin while hospitalized. Dose is 1.5mg  po daily.INR therapeutic on admission 2.29. INR continues to trend up and is now supratherapeutic at 4.91 this am. Yesterdays dose was held. CBC stable, no s/s of bleed  Goal of Therapy:  INR 2-3 Monitor platelets by anticoagulation protocol: Yes   Plan:  HOLD coumadin again today Monitor daily INR, CBC, s/s of bleed  Annisha Baar J 03/04/2015,10:35 AM

## 2015-03-05 DIAGNOSIS — J9621 Acute and chronic respiratory failure with hypoxia: Secondary | ICD-10-CM

## 2015-03-05 DIAGNOSIS — N179 Acute kidney failure, unspecified: Secondary | ICD-10-CM | POA: Insufficient documentation

## 2015-03-05 LAB — GLUCOSE, CAPILLARY
GLUCOSE-CAPILLARY: 130 mg/dL — AB (ref 65–99)
GLUCOSE-CAPILLARY: 78 mg/dL (ref 65–99)
GLUCOSE-CAPILLARY: 376 mg/dL — AB (ref 65–99)
Glucose-Capillary: 169 mg/dL — ABNORMAL HIGH (ref 65–99)
Glucose-Capillary: 271 mg/dL — ABNORMAL HIGH (ref 65–99)

## 2015-03-05 LAB — BASIC METABOLIC PANEL
ANION GAP: 9 (ref 5–15)
BUN: 59 mg/dL — AB (ref 6–20)
CHLORIDE: 106 mmol/L (ref 101–111)
CO2: 22 mmol/L (ref 22–32)
CREATININE: 5.08 mg/dL — AB (ref 0.44–1.00)
Calcium: 9.3 mg/dL (ref 8.9–10.3)
GFR calc Af Amer: 9 mL/min — ABNORMAL LOW (ref >60)
GFR, EST NON AFRICAN AMERICAN: 8 mL/min — AB (ref >60)
GLUCOSE: 89 mg/dL (ref 65–99)
POTASSIUM: 4.6 mmol/L (ref 3.5–5.1)
SODIUM: 137 mmol/L (ref 135–145)

## 2015-03-05 LAB — PROTIME-INR
INR: 3.45 — ABNORMAL HIGH (ref 0.00–1.49)
Prothrombin Time: 34 seconds — ABNORMAL HIGH (ref 11.6–15.2)

## 2015-03-05 LAB — CBC
HEMATOCRIT: 37.4 % (ref 36.0–46.0)
Hemoglobin: 12.4 g/dL (ref 12.0–15.0)
MCH: 29.5 pg (ref 26.0–34.0)
MCHC: 33.2 g/dL (ref 30.0–36.0)
MCV: 88.8 fL (ref 78.0–100.0)
Platelets: 534 10*3/uL — ABNORMAL HIGH (ref 150–400)
RBC: 4.21 MIL/uL (ref 3.87–5.11)
RDW: 17.5 % — ABNORMAL HIGH (ref 11.5–15.5)
WBC: 9.8 10*3/uL (ref 4.0–10.5)

## 2015-03-05 MED ORDER — PREDNISONE 20 MG PO TABS
40.0000 mg | ORAL_TABLET | Freq: Every day | ORAL | Status: DC
Start: 2015-03-05 — End: 2015-03-06
  Administered 2015-03-05 – 2015-03-06 (×2): 40 mg via ORAL
  Filled 2015-03-05 (×2): qty 2

## 2015-03-05 MED ORDER — ALLOPURINOL 100 MG PO TABS
100.0000 mg | ORAL_TABLET | Freq: Every day | ORAL | Status: DC
  Administered 2015-03-05 – 2015-03-06 (×2): 100 mg via ORAL
  Filled 2015-03-05 (×2): qty 1

## 2015-03-05 NOTE — Progress Notes (Signed)
Subjective:  She has no complaints- says  her UOP is up- but none recorded- renal function a little worse.  Tells me that she has transient belly pain after eating  Objective Vital signs in last 24 hours: Filed Vitals:   03/04/15 1958 03/04/15 2129 03/05/15 0455 03/05/15 1011  BP:  119/62 116/65 136/64  Pulse:  87 82 72  Temp: 98.1 F (36.7 C) 99.1 F (37.3 C) 98.4 F (36.9 C)   TempSrc: Oral Oral Oral   Resp:  16 16   Height:      Weight:   76.34 kg (168 lb 4.8 oz)   SpO2:  98% 97%    Weight change: -0.96 kg (-2 lb 1.9 oz)  Intake/Output Summary (Last 24 hours) at 03/05/15 1119 Last data filed at 03/05/15 1006  Gross per 24 hour  Intake    240 ml  Output      0 ml  Net    240 ml    Assessment/Plan: 69 year old black female with multiple medical issues including 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 She presented with abdominal symptoms felt to possibly be consistent with mesenteric ischemia. She now has acute on chronic renal failure  1.Renal- acute on chronic renal failure. Patient is a history of advanced chronic kidney disease being very close to dialysis requiring. She has suffered worsening of her renal function this hospitalization that is coincident in time with significantly low blood pressure. Therefore I suspect some ATN secondary to renal hypoperfusion. I put some parameters on her blood pressure medications and her blood pressure seems higher at this time. We will continue to monitor the situation. Hopefully renal function will start to plateau and improve. There are no acute indications for dialysis at this time - I have told her there is a chance that we would need to do dialysis this admit.  "it is what it is" 2. Hypertension/volume - is not significantly volume overloaded at this time. Is ordered Lasix 80 mg twice a day oral which is half of her home dose. Sodium and weight is better and pt endorses better uop  3. Anemia - is actually not an issue  at this time. Because she is having hemorrhoid issues I decided to stop her iron pill 4. GI symptoms- patient states better at this time. GI has seen. Leading thought on diagnosis was mesenteric ischemia- may be having intermittent episodes 5. Presumed gout- placed on prednisone   Sherald Balbuena A    Labs: Basic Metabolic Panel:  Recent Labs Lab 03/01/15 1154 03/02/15 0733 03/03/15 0307 03/04/15 0248 03/05/15 0620  NA  --  138 133* 131* 137  K  --  3.1* 5.1 5.6* 4.6  CL  --  108 107 105 106  CO2  --  20* 19* 18* 22  GLUCOSE  --  102* 78 72 89  BUN  --  48* 52* 53* 59*  CREATININE  --  3.70* 4.73* 4.91* 5.08*  CALCIUM  --  9.0 8.9 8.9 9.3  PHOS 4.6 6.2*  --   --   --    Liver Function Tests:  Recent Labs Lab 03/01/15 0445 03/02/15 0733  AST 20  --   ALT 14  --   ALKPHOS 156*  --   BILITOT 0.7  --   PROT 6.8  --   ALBUMIN 2.7* 2.4*    Recent Labs Lab 03/01/15 0445  LIPASE 24   No results for input(s): AMMONIA in the last 168 hours. CBC:  Recent Labs Lab 03/01/15 0445 03/02/15 0733 03/05/15 0620  WBC 10.1 9.6 9.8  HGB 14.0 13.8 12.4  HCT 41.4 43.0 37.4  MCV 85.9 88.1 88.8  PLT 809* 712* 534*   Cardiac Enzymes: No results for input(s): CKTOTAL, CKMB, CKMBINDEX, TROPONINI in the last 168 hours. CBG:  Recent Labs Lab 03/04/15 0853 03/04/15 1057 03/04/15 1613 03/04/15 2131 03/05/15 0833  GLUCAP 125* 118* 139* 130* 78    Iron Studies: No results for input(s): IRON, TIBC, TRANSFERRIN, FERRITIN in the last 72 hours. Studies/Results: No results found. Medications: Infusions:    Scheduled Medications: . allopurinol  100 mg Oral Daily  . antiseptic oral rinse  7 mL Mouth Rinse q12n4p  . atorvastatin  20 mg Oral QHS  . carvedilol  6.25 mg Oral BID WC  . chlorhexidine  15 mL Mouth Rinse BID  . dicyclomine  10 mg Oral TID AC  . famotidine  20 mg Oral Daily  . feeding supplement  1 Container Oral TID BM  . feeding supplement (GLUCERNA  1.2 CAL)  237 mL Oral TID BM  . feeding supplement (PRO-STAT SUGAR FREE 64)  30 mL Oral BID WC  . furosemide  80 mg Oral BID  . hydrocortisone  1 application Rectal QID  . insulin aspart  0-9 Units Subcutaneous TID WC  . insulin glargine  40 Units Subcutaneous q morning - 10a  . isosorbide-hydrALAZINE  0.5 tablet Oral TID  . mometasone-formoterol  2 puff Inhalation BID  . multivitamin with minerals  1 tablet Oral Daily  . pantoprazole  40 mg Oral Daily  . predniSONE  40 mg Oral Q breakfast  . saccharomyces boulardii  250 mg Oral BID  . sodium bicarbonate  650 mg Oral BID  . sodium chloride  3 mL Intravenous Q12H  . Warfarin - Pharmacist Dosing Inpatient   Does not apply q1800    have reviewed scheduled and prn medications.  Physical Exam: General: NAD- conversive Heart: RRR Lungs: mostly clear Abdomen: soft, obese, non tender Extremities: minimal edema Dialysis Access: none yet    03/05/2015,11:19 AM  LOS: 4 days

## 2015-03-05 NOTE — Progress Notes (Signed)
Advanced Heart Failure Rounding Note   Subjective:    No new complaints. Denies SOB, CP, ABD pain. Her foot is still bothering her.  Up 0.3 L yesterday.  Weight down 2 lbs with re-addition of po Lasix yesterday. Renal function continues to worsen. 3.7 -> 4.73 -> 4.9 -> 5.08  Objective:   Weight Range: 168 lb 4.8 oz (76.34 kg) Body mass index is 30.77 kg/(m^2).   Vital Signs:   Temp:  [97.4 F (36.3 C)-99.1 F (37.3 C)] 98.4 F (36.9 C) (08/03 0455) Pulse Rate:  [68-87] 82 (08/03 0455) Resp:  [15-18] 16 (08/03 0455) BP: (116-136)/(60-70) 116/65 mmHg (08/03 0455) SpO2:  [96 %-100 %] 97 % (08/03 0455) Weight:  [168 lb 4.8 oz (76.34 kg)] 168 lb 4.8 oz (76.34 kg) (08/03 0455) Last BM Date: 03/04/15  Weight change: Filed Weights   03/03/15 0320 03/04/15 0340 03/05/15 0455  Weight: 164 lb 7.4 oz (74.6 kg) 170 lb 6.7 oz (77.3 kg) 168 lb 4.8 oz (76.34 kg)    Intake/Output:   Intake/Output Summary (Last 24 hours) at 03/05/15 0838 Last data filed at 03/04/15 1630  Gross per 24 hour  Intake    240 ml  Output      0 ml  Net    240 ml     Physical Exam: General: NAD Neck: Thick, JVP 8-9 cm but difficult 2/2 girth, no thyromegaly or thyroid nodule.  Lungs: Slight crackles at bases bilaterally CV: Nondisplaced PMI. Heart regular S1/S2, no S3/S4, no murmur. No peripheral edema. No carotid bruit.  Abdomen: Soft, NT, ND. No HSM  Skin: Intact without lesions or rashes.  Neurologic: Alert and oriented x 3.  Psych: Normal affect. Extremities: No clubbing or cyanosis.  HEENT: Normal.    Telemetry: Sinus 70s with PVCs  Labs: CBC  Recent Labs  03/05/15 0620  WBC 9.8  HGB 12.4  HCT 37.4  MCV 88.8  PLT 355*   Basic Metabolic Panel  Recent Labs  03/03/15 0307 03/04/15 0248 03/05/15 0620  NA 133* 131* 137  K 5.1 5.6* 4.6  CL 107 105 106  CO2 19* 18* 22  GLUCOSE 78 72 89  BUN 52* 53* 59*  CALCIUM 8.9 8.9 9.3  MG 2.2  --   --    Liver Function  Tests No results for input(s): AST, ALT, ALKPHOS, BILITOT, PROT, ALBUMIN in the last 72 hours. No results for input(s): LIPASE, AMYLASE in the last 72 hours. Cardiac Enzymes No results for input(s): CKTOTAL, CKMB, CKMBINDEX, TROPONINI in the last 72 hours.  BNP: BNP (last 3 results)  Recent Labs  01/21/15 1830 02/02/15 0517 02/24/15 1612  BNP 1056.8* 2924.8* 475.5*    ProBNP (last 3 results)  Recent Labs  04/26/14 2358 06/24/14 0945  PROBNP 3725.0* 324.0*     D-Dimer No results for input(s): DDIMER in the last 72 hours. Hemoglobin A1C No results for input(s): HGBA1C in the last 72 hours. Fasting Lipid Panel No results for input(s): CHOL, HDL, LDLCALC, TRIG, CHOLHDL, LDLDIRECT in the last 72 hours. Thyroid Function Tests No results for input(s): TSH, T4TOTAL, T3FREE, THYROIDAB in the last 72 hours.  Invalid input(s): FREET3  Other results:     Imaging/Studies:  No results found.  Latest Echo  Latest Cath   Medications:     Scheduled Medications: . antiseptic oral rinse  7 mL Mouth Rinse q12n4p  . atorvastatin  20 mg Oral QHS  . carvedilol  6.25 mg Oral BID WC  . chlorhexidine  15 mL Mouth Rinse BID  . dicyclomine  10 mg Oral TID AC  . famotidine  20 mg Oral Daily  . feeding supplement  1 Container Oral TID BM  . feeding supplement (GLUCERNA 1.2 CAL)  237 mL Oral TID BM  . feeding supplement (PRO-STAT SUGAR FREE 64)  30 mL Oral BID WC  . furosemide  80 mg Oral BID  . hydrocortisone  1 application Rectal QID  . insulin aspart  0-9 Units Subcutaneous TID WC  . insulin glargine  40 Units Subcutaneous q morning - 10a  . isosorbide-hydrALAZINE  0.5 tablet Oral TID  . mometasone-formoterol  2 puff Inhalation BID  . multivitamin with minerals  1 tablet Oral Daily  . pantoprazole  40 mg Oral Daily  . saccharomyces boulardii  250 mg Oral BID  . sodium bicarbonate  650 mg Oral BID  . sodium chloride  3 mL Intravenous Q12H  . Warfarin - Pharmacist Dosing  Inpatient   Does not apply q1800    Infusions:    PRN Medications: acetaminophen, albuterol, diphenhydrAMINE, fluticasone, loperamide, morphine injection   Assessment/Plan  69 yo with history of OHS/OSA, sarcoidosis, CAD, suspected ischemic cardiomyopathy, LV apical thrombus, and CKD stage IV was re-admitted with abdominal pain, nausea, vomiting. 1. Abdominal pain/nausea/vomiting: She has had an extensive GI workup, possible mesenteric ischemia from low flow. Pain improved with higher BP.  2. Chronic systolic CHF: EF 87-68% on echo, suspected ischemic cardiomyopathy. No ischemia on Cardiolite. Suspect mild volume overload now with JVP 8-9 cm.  - Of note, her cardiac output was not low on recent RHC. However, need to promote higher BP with suspected mesenteric ischemia from low flow.  - Continue Lasix today 80 mg bid, had been taking up to 160 mg bid at home.  - Continue Bidil to 0.5 tab tid, with low diastolic.  May increase once BP more stable. - Continue Coreg 6.25 mg bid.  3. LV thrombus: Continue warfarin.  4. AKI on CKD: Stage IV, has fistula, has followup with nephrology. Possibly developed AKI from hypoperfusion/ATN in the setting of low BP.  Creatinine up to 5.08 from 3.7. Will follow BP and Cr with resuming of lasix.  Nephrology following, aware that she may need dialysis this admit. 5. 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 Suspect PAH component may be due to OHS/OSA. CT chest showed no active sarcoidosis. Doubt PEs (anticoagulated).  - Continue CPAP and home oxygen.  6. Syncope/unresponsive episodes: x 2 03/02/15. Telemetry reviewed, no arrhythmias to explain episode. ?fall in BP (has been low on her current medication regimen). As above, we have cut back on her BP-active meds.  7. Hx of Gout - Will add prednisone 40 mg x 3 days, Allopurinol dose adjust for renal per pharmacist.  Length of Stay: 4   Shirley Friar  PA-C 03/05/2015, 8:38 AM  Advanced Heart Failure Team Pager [phone] (M-F; 7a - 4p)  Please contact [facility] for night-coverage after hours (4p -7a ) and weekends on [email or web address]  Patient seen with PA, agree with the above note. Creatinine basically stable.  Continue current regimen, BP is good. Awaiting renal recovery.   Loralie Champagne 03/05/2015 11:35 AM

## 2015-03-05 NOTE — Progress Notes (Signed)
PATIENT DETAILS Name: [name] Age: 69 y.o. Sex: female Date of Birth: 13-Jul-1946 Admit Date: 03/01/2015 Admitting Physician Theodis Blaze, MD UDJ:SHFW, CAMMIE, MD  Subjective: Improving, less abdominal pain.  Assessment/Plan: Principal Problem: Abdominal pain: Thought to be secondary to mesenteric ischemia from low flow state. Slowly improving with supportive measures. Diet slowly advanced to regular diet which he seems to be tolerating well. Her abdomen remained soft  Active Problems: Mild acute on chronic systolic CHF:EF 26-37% on echo,suspected ischemic cardiomyopathy. Continue Lasix 80 mg twice a day, Coreg and BiDil. CHF team following. Medications have been adjusted by CHF/nephrology to allow blood pressure to increase somewhat.  Acute on chronic kidney disease stage IV: Suspected cardiorenal syndrome. Nephrology following, nephrology hopeful that renal function will start to plateau and improve soon.  Syncopal episode: Doubt cardiac etiology, no major arrhythmias seen on telemetry. May be from? Hypotensive episodes. Follow. Cardiology is following  LV thrombus: Continue Coumadin-INR at 3.45, pharmacy managing Coumadin  Presumed gout: Continue prednisone 3 days from 8/3  History of diabetes: CBGs stable with 40 units of Lantus and SSI. Follow  History of hypertension: Allow for some permissive hypertension to increase renal perfusion-continue Coreg 6.25 mg twice a day, Lasix 80 mg twice a day, and BiDil. Follow and optimize accordingly  GERD: Continue PPI  History of pulmonary hypertension: PA pressure 62 mmHg by last echo on 6/21  Disposition: Remain inpatient  Antimicrobial agents  See below  Anti-infectives    None      DVT Prophylaxis: Coumadin  Code Status: Full code   Family Communication None at bedside  Procedures: None  CONSULTS:  cardiology, GI and nephrology  Time spent 30 minutes-Greater than 50% of this  time was spent in counseling, explanation of diagnosis, planning of further management, and coordination of care.  MEDICATIONS: Scheduled Meds: . allopurinol  100 mg Oral Daily  . antiseptic oral rinse  7 mL Mouth Rinse q12n4p  . atorvastatin  20 mg Oral QHS  . carvedilol  6.25 mg Oral BID WC  . chlorhexidine  15 mL Mouth Rinse BID  . dicyclomine  10 mg Oral TID AC  . famotidine  20 mg Oral Daily  . feeding supplement  1 Container Oral TID BM  . feeding supplement (GLUCERNA 1.2 CAL)  237 mL Oral TID BM  . feeding supplement (PRO-STAT SUGAR FREE 64)  30 mL Oral BID WC  . furosemide  80 mg Oral BID  . hydrocortisone  1 application Rectal QID  . insulin aspart  0-9 Units Subcutaneous TID WC  . insulin glargine  40 Units Subcutaneous q morning - 10a  . isosorbide-hydrALAZINE  0.5 tablet Oral TID  . mometasone-formoterol  2 puff Inhalation BID  . multivitamin with minerals  1 tablet Oral Daily  . pantoprazole  40 mg Oral Daily  . predniSONE  40 mg Oral Q breakfast  . saccharomyces boulardii  250 mg Oral BID  . sodium bicarbonate  650 mg Oral BID  . sodium chloride  3 mL Intravenous Q12H  . Warfarin - Pharmacist Dosing Inpatient   Does not apply q1800   Continuous Infusions:  PRN Meds:.acetaminophen, albuterol, diphenhydrAMINE, fluticasone, loperamide, morphine injection    PHYSICAL EXAM: Vital signs in last 24 hours: Filed Vitals:   03/04/15 1958 03/04/15 2129 03/05/15 0455 03/05/15 1011  BP:  119/62 116/65 136/64  Pulse:  87 82 72  Temp: 98.1  F (36.7 C) 99.1 F (37.3 C) 98.4 F (36.9 C)   TempSrc: Oral Oral Oral   Resp:  16 16   Height:      Weight:   76.34 kg (168 lb 4.8 oz)   SpO2:  98% 97%     Weight change: -0.96 kg (-2 lb 1.9 oz) Filed Weights   03/03/15 0320 03/04/15 0340 03/05/15 0455  Weight: 74.6 kg (164 lb 7.4 oz) 77.3 kg (170 lb 6.7 oz) 76.34 kg (168 lb 4.8 oz)   Body mass index is 30.77 kg/(m^2).   Gen Exam: Awake and alert with clear speech.   Neck:  Supple, No JVD.   Chest: B/L Clear.   CVS: S1 S2 Regular, no murmurs.  Abdomen: soft, BS +, minimally tender in mid abdomen, non distended.  Extremities: no edema, lower extremities warm to touch. Neurologic: Non Focal.   Skin: No Rash.   Wounds: N/A.   Intake/Output from previous day:  Intake/Output Summary (Last 24 hours) at 03/05/15 1424 Last data filed at 03/05/15 1245  Gross per 24 hour  Intake    240 ml  Output    420 ml  Net   -180 ml     LAB RESULTS: CBC  Recent Labs Lab 03/01/15 0445 03/02/15 0733 03/05/15 0620  WBC 10.1 9.6 9.8  HGB 14.0 13.8 12.4  HCT 41.4 43.0 37.4  PLT 809* 712* 534*  MCV 85.9 88.1 88.8  MCH 29.0 28.3 29.5  MCHC 33.8 32.1 33.2  RDW 16.7* 17.4* 17.5*    Chemistries   Recent Labs Lab 03/01/15 0445 03/01/15 1154 03/02/15 0733 03/03/15 0307 03/04/15 0248 03/05/15 0620  NA 138  --  138 133* 131* 137  K 3.4*  --  3.1* 5.1 5.6* 4.6  CL 107  --  108 107 105 106  CO2 15*  --  20* 19* 18* 22  GLUCOSE 230*  --  102* 78 72 89  BUN 50*  --  48* 52* 53* 59*  CREATININE 3.63*  --  3.70* 4.73* 4.91* 5.08*  CALCIUM 9.7  --  9.0 8.9 8.9 9.3  MG  --  1.6*  --  2.2  --   --     CBG:  Recent Labs Lab 03/04/15 1057 03/04/15 1613 03/04/15 2131 03/05/15 0833 03/05/15 1207  GLUCAP 118* 139* 130* 78 169*    GFR Estimated Creatinine Clearance: 10.4 mL/min (by C-G formula based on Cr of 5.08).  Coagulation profile  Recent Labs Lab 03/01/15 1154 03/02/15 0733 03/03/15 0307 03/04/15 0248 03/05/15 0620  INR 2.29* 2.99* 3.68* 4.91* 3.45*    Cardiac Enzymes No results for input(s): CKMB, TROPONINI, MYOGLOBIN in the last 168 hours.  Invalid input(s): CK  Invalid input(s): POCBNP No results for input(s): DDIMER in the last 72 hours. No results for input(s): HGBA1C in the last 72 hours. No results for input(s): CHOL, HDL, LDLCALC, TRIG, CHOLHDL, LDLDIRECT in the last 72 hours. No results for input(s): TSH, T4TOTAL, T3FREE,  THYROIDAB in the last 72 hours.  Invalid input(s): FREET3 No results for input(s): VITAMINB12, FOLATE, FERRITIN, TIBC, IRON, RETICCTPCT in the last 72 hours. No results for input(s): LIPASE, AMYLASE in the last 72 hours.  Urine Studies No results for input(s): UHGB, CRYS in the last 72 hours.  Invalid input(s): UACOL, UAPR, USPG, UPH, UTP, UGL, UKET, UBIL, UNIT, UROB, ULEU, UEPI, UWBC, URBC, UBAC, CAST, UCOM, BILUA  MICROBIOLOGY: Recent Results (from the past 240 hour(s))  MRSA PCR Screening  Status: None   Collection Time: 03/02/15  7:01 PM  Result Value Ref Range Status   MRSA by PCR NEGATIVE NEGATIVE Final    Comment:        The GeneXpert MRSA Assay (FDA approved for NASAL specimens only), is one component of a comprehensive MRSA colonization surveillance program. It is not intended to diagnose MRSA infection nor to guide or monitor treatment for MRSA infections.   Urine culture     Status: None (Preliminary result)   Collection Time: 03/04/15 12:35 AM  Result Value Ref Range Status   Specimen Description URINE, RANDOM  Final   Special Requests NONE  Final   Culture >=100,000 COLONIES/mL ESCHERICHIA COLI  Final   Report Status PENDING  Incomplete  C difficile quick scan w PCR reflex     Status: Abnormal   Collection Time: 03/04/15 12:15 PM  Result Value Ref Range Status   C Diff antigen POSITIVE (A) NEGATIVE Final   C Diff toxin NEGATIVE NEGATIVE Final   C Diff interpretation   Final    C. difficile present, but toxin not detected. This indicates colonization. In most cases, this does not require treatment. If patient has signs and symptoms consistent with colitis, consider treatment.    RADIOLOGY STUDIES/RESULTS: Dg Chest 2 View  02/24/2015   CLINICAL DATA:  Short of breath and weakness  EXAM: CHEST  2 VIEW  COMPARISON:  CT 02/14/2015  FINDINGS: Normal mediastinum and cardiac silhouette. Chronic central bronchitic markings. Normal pulmonary vasculature. No  effusion, infiltrate, or pneumothorax.  IMPRESSION: Mild bronchitic markings.  No acute findings.   Electronically Signed   By: Suzy Bouchard M.D.   On: 02/24/2015 17:02   Ct Chest High Resolution  02/14/2015   CLINICAL DATA:  Sarcoid. Off prednisone, on Coumadin. Evaluate for disease progression. Chronic shortness of breath.  EXAM: CT CHEST WITHOUT CONTRAST  TECHNIQUE: Multidetector CT imaging of the chest was performed following the standard protocol without intravenous contrast. High resolution imaging of the lungs, as well as inspiratory and expiratory imaging, was performed.  COMPARISON:  04/28/2014 and 10/04/2010.  FINDINGS: Mediastinum/Nodes: No pathologically enlarged mediastinal or axillary lymph nodes. Hilar regions are difficult to definitively evaluate without IV contrast. Atherosclerotic calcification of the arterial vasculature, including coronary arteries. Heart is mildly enlarged. No pericardial effusion.  Lungs/Pleura: Mild linear scarring and volume loss in both lower lobes, unchanged. No peribronchovascular nodularity. No subpleural reticulation, traction bronchiectasis/ bronchiolectasis, ground-glass, architectural distortion or honeycombing. No air trapping. Lungs are otherwise clear. No pleural fluid. Airway is unremarkable.  Upper abdomen: Partially calcified low-attenuation lesion in the posterior dome of the liver measures 1.9 cm and is unchanged over multiple prior exams, indicative of a benign lesion. Visualized portions of the liver and stomach are grossly unremarkable. Splenule in the left upper quadrant.  Musculoskeletal: No worrisome lytic or sclerotic lesions.  IMPRESSION: 1. No CT findings indicative of sarcoid. 2. Coronary artery calcification.   Electronically Signed   By: Lorin Picket M.D.   On: 02/14/2015 08:11    Oren Binet, MD  [facility] Pager:[phone]  If 7PM-7AM, please contact night-coverage [email or web address] Password [email or web address] 03/05/2015, 2:24  PM   LOS: 4 days

## 2015-03-05 NOTE — Progress Notes (Signed)
Physical Therapy Treatment Patient Details Name: ANNELIES COYT MRN: [id number] DOB: 01-28-1946 Today's Date: 2015/03/31    History of Present Illness 69 yo female with history of N & V, recently diagnosed with left ventricular thrombus, COPD with nocturnal oxygen .    PT Comments    Pt is having a tired afternoon with limited gait endurance but very good control of mobility.  Her starting pulse rate was 100 which may account for the fatigue.  Plan to go home with HHPT and using RW at baseline.  Follow Up Recommendations  Home health PT     Equipment Recommendations  None recommended by PT    Recommendations for Other Services       Precautions / Restrictions Precautions Precautions: Fall Restrictions Weight Bearing Restrictions: No    Mobility  Bed Mobility Overal bed mobility: Modified Independent                Transfers Overall transfer level: Modified independent Equipment used: Rolling walker (2 wheeled) Transfers: Sit to/from [facility] Sit to Stand: Supervision Stand pivot transfers: Supervision       General transfer comment: aware of the obstacles and impediments to balance  Ambulation/Gait Ambulation/Gait assistance: Supervision Ambulation Distance (Feet): 40 Feet Assistive device: Rolling walker (2 wheeled) Gait Pattern/deviations: Step-through pattern Gait velocity: slow Gait velocity interpretation: Below normal speed for age/gender General Gait Details: Pt managed walker through the doorway to BR alone   Stairs            Wheelchair Mobility    Modified Rankin (Stroke Patients Only)       Balance Overall balance assessment: History of Falls                                  Cognition Arousal/Alertness: Awake/alert Behavior During Therapy: WFL for tasks assessed/performed Overall Cognitive Status: Within Functional Limits for tasks assessed                      Exercises General  Exercises - Lower Extremity Ankle Circles/Pumps: AROM;Both;10 reps Quad Sets: AROM;Both;10 reps Long Arc Quad: Strengthening;Both;20 reps Heel Slides: Strengthening;Both;20 reps Hip ABduction/ADduction: Strengthening;Both;10 reps Hip Flexion/Marching: AROM;Both;10 reps    General Comments General comments (skin integrity, edema, etc.): Safe with maneuvering on walker but strength is reduced per her exercise performance      Pertinent Vitals/Pain Pain Assessment: No/denies pain    Home Living                      Prior Function            PT Goals (current goals can now be found in the care plan section) Acute Rehab PT Goals Patient Stated Goal: get better Progress towards PT goals: Progressing toward goals    Frequency  Min 3X/week    PT Plan Current plan remains appropriate    Co-evaluation             End of Session   Activity Tolerance: Patient tolerated treatment well Patient left: in chair;with call bell/phone within reach     Time: 1440-1504 PT Time Calculation (min) (ACUTE ONLY): 24 min  Charges:  $Gait Training: 8-22 mins $Therapeutic Exercise: 8-22 mins                    G Codes:      Ramond Dial 2015-03-31, 3:16  PM   Mee Hives, PT MS Acute Rehab Dept. Number: [phone] [phone] and [place] [phone]

## 2015-03-05 NOTE — Progress Notes (Signed)
Utilization Review completed. Taylinn Brabant RN BSN CM 

## 2015-03-05 NOTE — Progress Notes (Signed)
ANTICOAGULATION CONSULT NOTE - Follow Up Consult  Pharmacy Consult for Coumadin Indication: L mural thrombus  Allergies  Allergen Reactions  . Adhesive [Tape] Other (See Comments)    Blisters   . Avelox [Moxifloxacin Hcl In Nacl] Other (See Comments)    GI upset  . Codeine Other (See Comments)    "crazy"   . Guaifenesin Nausea And Vomiting    Takes Mucinex at home without issue  . Latex Swelling  . Oxycodone Nausea And Vomiting    Takes Percocet at home without issue    Patient Measurements: Height: 5\' 2"  (157.5 cm) Weight: 168 lb 4.8 oz (76.34 kg) IBW/kg (Calculated) : 50.1  Vital Signs: Temp: 98.4 F (36.9 C) (08/03 0455) Temp Source: Oral (08/03 0455) BP: 136/64 mmHg (08/03 1011) Pulse Rate: 72 (08/03 1011)  Labs:  Recent Labs  03/03/15 0307 03/04/15 0248 03/05/15 0620  HGB  --   --  12.4  HCT  --   --  37.4  PLT  --   --  534*  LABPROT 35.7* 44.3* 34.0*  INR 3.68* 4.91* 3.45*  CREATININE 4.73* 4.91* 5.08*    Estimated Creatinine Clearance: 10.4 mL/min (by C-G formula based on Cr of 5.08).  Assessment: 68yof continues on coumadin for her L mural thrombus. Doses have been held the last two days due to supratherpeutic INR. INR has trended down some today but still remains above goal at 3.45. CBC is stable. No bleeding reported.  Goal of Therapy:  INR 2-3 Monitor platelets by anticoagulation protocol: Yes   Plan:  1) Hold coumadin again tonight 2) Daily INR  Deboraha Sprang 03/05/2015,2:03 PM

## 2015-03-06 DIAGNOSIS — R1110 Vomiting, unspecified: Secondary | ICD-10-CM

## 2015-03-06 LAB — PROTIME-INR
INR: 2.03 — AB (ref 0.00–1.49)
Prothrombin Time: 22.8 seconds — ABNORMAL HIGH (ref 11.6–15.2)

## 2015-03-06 LAB — BASIC METABOLIC PANEL
Anion gap: 12 (ref 5–15)
BUN: 66 mg/dL — ABNORMAL HIGH (ref 6–20)
CHLORIDE: 99 mmol/L — AB (ref 101–111)
CO2: 23 mmol/L (ref 22–32)
CREATININE: 4.67 mg/dL — AB (ref 0.44–1.00)
Calcium: 9.2 mg/dL (ref 8.9–10.3)
GFR calc Af Amer: 10 mL/min — ABNORMAL LOW (ref >60)
GFR, EST NON AFRICAN AMERICAN: 9 mL/min — AB (ref >60)
Glucose, Bld: 165 mg/dL — ABNORMAL HIGH (ref 65–99)
Potassium: 4 mmol/L (ref 3.5–5.1)
SODIUM: 134 mmol/L — AB (ref 135–145)

## 2015-03-06 LAB — URINE CULTURE: Culture: >=100000

## 2015-03-06 LAB — GLUCOSE, CAPILLARY
Glucose-Capillary: 162 mg/dL — ABNORMAL HIGH (ref 65–99)
Glucose-Capillary: 345 mg/dL — ABNORMAL HIGH (ref 65–99)
Glucose-Capillary: 297 mg/dL — ABNORMAL HIGH (ref 65–99)

## 2015-03-06 MED ORDER — PREDNISONE 20 MG PO TABS: 40.0000 mg | ORAL_TABLET | Freq: Every day | ORAL | Status: DC

## 2015-03-06 MED ORDER — WARFARIN SODIUM 1 MG PO TABS: 1.5000 mg | ORAL_TABLET | Freq: Every evening | ORAL | Status: DC

## 2015-03-06 MED ORDER — CARVEDILOL 12.5 MG PO TABS: 6.2500 mg | ORAL_TABLET | Freq: Two times a day (BID) | ORAL | Status: DC

## 2015-03-06 MED ORDER — WARFARIN SODIUM 3 MG PO TABS
3.0000 mg | ORAL_TABLET | Freq: Once | ORAL | Status: AC
  Administered 2015-03-06: 3 mg via ORAL
  Filled 2015-03-06: qty 1

## 2015-03-06 MED ORDER — ISOSORB DINITRATE-HYDRALAZINE 20-37.5 MG PO TABS: 0.5000 | ORAL_TABLET | Freq: Three times a day (TID) | ORAL | Status: DC

## 2015-03-06 MED ORDER — BOOST / RESOURCE BREEZE PO LIQD: 1.0000 | Freq: Three times a day (TID) | ORAL | Status: DC

## 2015-03-06 MED ORDER — CEPHALEXIN 250 MG PO CAPS: 250.0000 mg | ORAL_CAPSULE | Freq: Two times a day (BID) | ORAL | Status: DC

## 2015-03-06 MED ORDER — FUROSEMIDE 80 MG PO TABS: 80.0000 mg | ORAL_TABLET | Freq: Two times a day (BID) | ORAL | Status: DC

## 2015-03-06 NOTE — Progress Notes (Signed)
Advanced Heart Failure Rounding Note   Subjective:    Feeling good. Denies SOB, CP, or lightheadedness. Her foot feels better on prednisone.  Says she has some slight abd discomfort but denies N/V/D.  Down 0.4 L yesterday.  Weight shows up 5 lbs. Renal function continues to worsen. 3.7 -> 4.73 -> 4.9 -> 5.08 -> 4.67  Objective:   Weight Range: 173 lb (78.472 kg) Body mass index is 31.63 kg/(m^2).   Vital Signs:   Temp:  [98.1 F (36.7 C)-98.3 F (36.8 C)] 98.3 F (36.8 C) (08/04 0614) Pulse Rate:  [71-81] 71 (08/04 0614) Resp:  [16-20] 20 (08/04 0614) BP: (136-173)/(63-74) 173/74 mmHg (08/04 0614) SpO2:  [95 %-100 %] 95 % (08/04 0240) Weight:  [173 lb (78.472 kg)] 173 lb (78.472 kg) (08/04 0614) Last BM Date: 03/04/15  Weight change: Filed Weights   03/04/15 0340 03/05/15 0455 03/06/15 0614  Weight: 170 lb 6.7 oz (77.3 kg) 168 lb 4.8 oz (76.34 kg) 173 lb (78.472 kg)    Intake/Output:   Intake/Output Summary (Last 24 hours) at 03/06/15 0832 Last data filed at 03/06/15 0831  Gross per 24 hour  Intake    360 ml  Output   1120 ml  Net   -760 ml     Physical Exam: General: NAD Neck: Thick, JVP 8-9 cm but difficult 2/2 girth, no thyromegaly or thyroid nodule.  Lungs: Slight crackles at bases bilaterally CV: Nondisplaced PMI. Heart irregular. Normal S1/S2, no S3/S4, no murmur. No peripheral edema. No carotid bruit.  Abdomen: Soft, NT, ND. No HSM  Skin: Intact without lesions or rashes.  Neurologic: Alert and oriented x 3.  Psych: Normal affect. Extremities: No clubbing or cyanosis.  HEENT: Normal.    Telemetry: NSR 70s with PVCs. Some irregularity but p waves present.  Labs: CBC  Recent Labs  03/05/15 0620  WBC 9.8  HGB 12.4  HCT 37.4  MCV 88.8  PLT 973*   Basic Metabolic Panel  Recent Labs  03/05/15 0620 03/06/15 0601  NA 137 134*  K 4.6 4.0  CL 106 99*  CO2 22 23  GLUCOSE 89 165*  BUN 59* 66*  CALCIUM 9.3 9.2   Liver Function  Tests No results for input(s): AST, ALT, ALKPHOS, BILITOT, PROT, ALBUMIN in the last 72 hours. No results for input(s): LIPASE, AMYLASE in the last 72 hours. Cardiac Enzymes No results for input(s): CKTOTAL, CKMB, CKMBINDEX, TROPONINI in the last 72 hours.  BNP: BNP (last 3 results)  Recent Labs  01/21/15 1830 02/02/15 0517 02/24/15 1612  BNP 1056.8* 2924.8* 475.5*    ProBNP (last 3 results)  Recent Labs  04/26/14 2358 06/24/14 0945  PROBNP 3725.0* 324.0*     D-Dimer No results for input(s): DDIMER in the last 72 hours. Hemoglobin A1C No results for input(s): HGBA1C in the last 72 hours. Fasting Lipid Panel No results for input(s): CHOL, HDL, LDLCALC, TRIG, CHOLHDL, LDLDIRECT in the last 72 hours. Thyroid Function Tests No results for input(s): TSH, T4TOTAL, T3FREE, THYROIDAB in the last 72 hours.  Invalid input(s): FREET3  Other results:     Imaging/Studies:  No results found.  Latest Echo  Latest Cath   Medications:     Scheduled Medications: . allopurinol  100 mg Oral Daily  . antiseptic oral rinse  7 mL Mouth Rinse q12n4p  . atorvastatin  20 mg Oral QHS  . carvedilol  6.25 mg Oral BID WC  . chlorhexidine  15 mL Mouth Rinse BID  . dicyclomine  10 mg Oral TID AC  . famotidine  20 mg Oral Daily  . feeding supplement  1 Container Oral TID BM  . feeding supplement (GLUCERNA 1.2 CAL)  237 mL Oral TID BM  . feeding supplement (PRO-STAT SUGAR FREE 64)  30 mL Oral BID WC  . furosemide  80 mg Oral BID  . hydrocortisone  1 application Rectal QID  . insulin aspart  0-9 Units Subcutaneous TID WC  . insulin glargine  40 Units Subcutaneous q morning - 10a  . isosorbide-hydrALAZINE  0.5 tablet Oral TID  . mometasone-formoterol  2 puff Inhalation BID  . multivitamin with minerals  1 tablet Oral Daily  . pantoprazole  40 mg Oral Daily  . predniSONE  40 mg Oral Q breakfast  . saccharomyces boulardii  250 mg Oral BID  . sodium bicarbonate  650 mg Oral BID   . sodium chloride  3 mL Intravenous Q12H  . Warfarin - Pharmacist Dosing Inpatient   Does not apply q1800    Infusions:    PRN Medications: acetaminophen, albuterol, diphenhydrAMINE, fluticasone, loperamide, morphine injection   Assessment/Plan  69 yo with history of OHS/OSA, sarcoidosis, CAD, suspected ischemic cardiomyopathy, LV apical thrombus, and CKD stage IV was re-admitted with abdominal pain, nausea, vomiting. 1. Abdominal pain/nausea/vomiting: She has had an extensive GI workup, possible mesenteric ischemia from low flow. Pain improved with higher BP.  2. Chronic systolic CHF: EF 02-63% on echo, suspected ischemic cardiomyopathy. No ischemia on Cardiolite. Suspect mild volume overload now with JVP 8-9 cm.  - Of note, her cardiac output was not low on recent RHC. However, need to promote higher BP with suspected mesenteric ischemia from low flow.  - Continue Lasix 80 mg bid, had been taking up to 160 mg bid at home.  - Continue Bidil to 0.5 tab tid, with low diastolic.  May increase once BP more stable. - Continue Coreg 6.25 mg bid.  3. LV thrombus: Continue warfarin.  4. AKI on CKD: Stage IV, has fistula, has followup with nephrology. Possibly developed AKI from hypoperfusion/ATN in the setting of low BP.  Creatinine up to 5.08 from 3.7. Will follow BP and Cr with resuming of lasix.  Nephrology following, aware that she may need dialysis this admit. 5. 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 Suspect PAH component may be due to OHS/OSA. CT chest showed no active sarcoidosis. Doubt PEs (anticoagulated).  - Continue CPAP and home oxygen.  6. Syncope/unresponsive episodes: x 2 03/02/15. Telemetry reviewed, no arrhythmias to explain episode. ?fall in BP (has been low on her current medication regimen). As above, we have cut back on her BP-active meds.  7. Hx of Gout - Will add prednisone 40 mg x 3 days, Allopurinol dose adjust for  renal per pharmacist.    Continue current regimen while awaiting renal recovery.  Length of Stay: [place] PA-C 03/06/2015, 8:32 AM  Advanced Heart Failure Team Pager [phone] (M-F; 7a - 4p)  Please contact [facility] for night-coverage after hours (4p -7a ) and weekends on [email or web address]  Patient seen and examined with Oda Kilts, PA-C. We discussed all aspects of the encounter. I agree with the assessment and plan as stated above.   Volume status stable. Ab pain improved with higher BP. Creatinine slightly improved will likely need HD soon. Ok to go home from our standpoint on current regimen. Will need close f/u with Renal team. Has e coli UTI - treatment  per primary team.   We will sign off.   Bradley [place],MD 1:55 PM

## 2015-03-06 NOTE — Progress Notes (Signed)
Occupational Therapy Treatment Patient Details Name: [name] MRN: [id number] DOB: [birth year] Today's Date: 03/06/2015    History of present illness 69 yo female with history of N & V, recently diagnosed with left ventricular thrombus, COPD with nocturnal oxygen .   OT comments  Pt making good progress.  Educated on bilateral UE exercises and completed sitting in bedside chair.  Pt also provided with handout as reference.  Also provided handout and education on energy conservation strategies for selfcare, home management, and shopping.  Recommend HHOT to continue with progression as well once d/c'd.  Follow Up Recommendations  Home health OT;Supervision - Intermittent    Equipment Recommendations  None recommended by OT       Precautions / Restrictions Precautions Precautions: Fall Restrictions Weight Bearing Restrictions: No          Balance Overall balance assessment: Needs assistance   Sitting balance-Leahy Scale: Good Sitting balance - Comments: Pt with increased posterior lean during UE exercises but no LOB noted. Postural control: Posterior lean                         ADL                                         General ADL Comments: Pt educated on energy conservation strategies as well as provided with handout.  Performed bilateral UE exercises in sitting EOC with use of light orange theraband.  Pt only able to tolerate AROM shoulder flexion in the right arm at this time.  Provided handout for exercises as well.  Pt needs min instructional cueing for correct technique when performing.                  Cognition   Behavior During Therapy: WFL for tasks assessed/performed Overall Cognitive Status: Within Functional Limits for tasks assessed                         Exercises General Exercises - Upper Extremity Shoulder Flexion: AROM;Strengthening;Theraband;5 reps;10 reps;Seated;Both (AROM 5 reps for 4 sets on the  right 2 sets of 10 reps on the left with band.) Theraband Level (Shoulder Flexion): Level 1 (Yellow) Shoulder Horizontal ABduction: Strengthening;Both;10 reps;Seated Shoulder Exercises Shoulder Extension: Strengthening;Both;Seated;10 reps (bilateral row for 2 sets, one set with elbows by her side and the other with elbows abducted)           Pertinent Vitals/ Pain       Pain Assessment: No/denies pain         Frequency Min 2X/week     Progress Toward Goals  OT Goals(current goals can now be found in the care plan section)  Progress towards OT goals: Progressing toward goals     Plan Discharge plan remains appropriate       End of Session     Activity Tolerance Patient tolerated treatment well   Patient Left in chair;with call bell/phone within reach   Nurse Communication          Time: 1155-2080 OT Time Calculation (min): 26 min  Charges: OT General Charges $OT Visit: 1 Procedure OT Treatments $Therapeutic Exercise: 23-37 mins  Ryszard Socarras OTR/L 03/06/2015, 11:35 AM

## 2015-03-06 NOTE — Progress Notes (Signed)
Inpatient Diabetes Program Recommendations  AACE/ADA: New Consensus Statement on Inpatient Glycemic Control (2013)  Target Ranges:  Prepandial:   less than 140 mg/dL      Peak postprandial:   less than 180 mg/dL (1-2 hours)      Critically ill patients:  140 - 180 mg/dL   Inpatient Diabetes Program Recommendations Insulin - Meal Coverage: add Novolog 4 units TID with meals  Thank you  Raoul Pitch BSN, RN,CDE Inpatient Diabetes Coordinator [phone] (team pager)

## 2015-03-06 NOTE — Progress Notes (Signed)
ANTICOAGULATION CONSULT NOTE - Follow Up Consult  Pharmacy Consult for Coumadin Indication: L mural thrombus  Allergies  Allergen Reactions  . Adhesive [Tape] Other (See Comments)    Blisters   . Avelox [Moxifloxacin Hcl In Nacl] Other (See Comments)    GI upset  . Codeine Other (See Comments)    "crazy"   . Guaifenesin Nausea And Vomiting    Takes Mucinex at home without issue  . Latex Swelling  . Oxycodone Nausea And Vomiting    Takes Percocet at home without issue    Patient Measurements: Height: 5\' 2"  (157.5 cm) Weight: 173 lb (78.472 kg) IBW/kg (Calculated) : 50.1  Vital Signs: Temp: 98.3 F (36.8 C) (08/04 0614) Temp Source: Oral (08/04 0614) BP: 140/85 mmHg (08/04 1042) Pulse Rate: 65 (08/04 1042)  Labs:  Recent Labs  03/04/15 0248 03/05/15 0620 03/06/15 0601  HGB  --  12.4  --   HCT  --  37.4  --   PLT  --  534*  --   LABPROT 44.3* 34.0* 22.8*  INR 4.91* 3.45* 2.03*  CREATININE 4.91* 5.08* 4.67*    Estimated Creatinine Clearance: 11.5 mL/min (by C-G formula based on Cr of 4.67).  Assessment: 68yof continues on coumadin for her L mural thrombus. Doses held 8/1-8/13 due to supratherpeutic INR. INR has dropped significantly and now back within goal range so will resume coumadin. No bleeding reported.  Home coumadin dose: 1.5mg  daily  Goal of Therapy:  INR 2-3 Monitor platelets by anticoagulation protocol: Yes   Plan:  1) Coumadin 3mg  x 1 2) Daily INR  Deboraha Sprang 03/06/2015,10:58 AM

## 2015-03-06 NOTE — Progress Notes (Addendum)
[place] to be D/C'd Home per MD order.  Discussed with the patient and all questions fully answered.  VSS. IV catheter discontinued intact. Site without signs and symptoms of complications. Dressing and pressure applied.  An After Visit Summary was printed and given to the patient. Patient received prescription.  D/c education completed with patient/family including follow up instructions, medication list, d/c activities limitations if indicated, with other d/c instructions as indicated by MD - patient able to verbalize understanding, all questions fully answered.   Patient instructed to return to ED, call 911, or call MD for any changes in condition.   Patient waiting for family to pick up and d/c via private auto  Janalyn Shy 03/06/2015 4:37 PM

## 2015-03-06 NOTE — Discharge Summary (Signed)
PATIENT DETAILS Name: [name] Age: 69 y.o. Sex: female Date of Birth: [birth year] MRN: [id number]. Admitting Physician: Theodis Blaze, MD OHY:WVPX, CAMMIE, MD  Admit Date: 03/01/2015 Discharge date: 03/06/2015  Recommendations for Outpatient Follow-up:  1. Please check CBC, chemistries and INR at next visit  PRIMARY DISCHARGE DIAGNOSIS:  Principal Problem:   Nausea and vomiting Active Problems:   ABDOMINAL PAIN -GENERALIZED   Acute on chronic respiratory failure   Acute renal failure superimposed on stage 4 chronic kidney disease   Syncope   Acute on chronic systolic CHF (congestive heart failure)   ARF (acute renal failure)      PAST MEDICAL HISTORY: Past Medical History  Diagnosis Date  . Essential hypertension, benign   . Coronary atherosclerosis of native coronary artery 01/04/2006    Tiny OM1 70-90% ostial stenosis, no other CAD  . COPD (chronic obstructive pulmonary disease)   . Esophageal reflux   . Dyslipidemia   . Gout   . Colon polyps 2011    Tubular adenomatous polyps  . Spinal stenosis of lumbar region     chronic low back pain.   . Bulging lumbar disc   . Chronic diastolic heart failure   . PNA (pneumonia) 2012    With pleural effusion, requiring thoracentesis  . Chronic bronchitis   . On home oxygen therapy   . OSA (obstructive sleep apnea)     Marginally compliant with CPAP  . Type II diabetes mellitus   . Arthritis   . Diverticulosis   . GERD (gastroesophageal reflux disease)   . Anxiety   . Hyperlipidemia   . Sarcoidosis of lung   . CKD (chronic kidney disease) stage 4, GFR 15-29 ml/min   . IBS (irritable bowel syndrome)   . Cataracts, both eyes   . Headache(784.0)   . Lactic acidosis 01/21/2015  . Chronic CHF (congestive heart failure)     DISCHARGE MEDICATIONS: Current Discharge Medication List    START taking these medications   Details  cephALEXin (KEFLEX) 250 MG capsule Take 1 capsule (250 mg total) by mouth 2 (two)  times daily. Qty: 6 capsule, Refills: 0    feeding supplement (BOOST / RESOURCE BREEZE) LIQD Take 1 Container by mouth 3 (three) times daily between meals. Qty: 90 Container, Refills: 0    predniSONE (DELTASONE) 20 MG tablet Take 2 tablets (40 mg total) by mouth daily with breakfast. Qty: 2 tablet, Refills: 0      CONTINUE these medications which have CHANGED   Details  carvedilol (COREG) 12.5 MG tablet Take 0.5 tablets (6.25 mg total) by mouth 2 (two) times daily with a meal. Qty: 60 tablet, Refills: 0    furosemide (LASIX) 80 MG tablet Take 1 tablet (80 mg total) by mouth 2 (two) times daily. Qty: 60 tablet, Refills: 0    isosorbide-hydrALAZINE (BIDIL) 20-37.5 MG per tablet Take 0.5 tablets by mouth 3 (three) times daily. Qty: 90 tablet, Refills: 0    warfarin (COUMADIN) 1 MG tablet Take 1.5 tablets (1.5 mg total) by mouth every evening. Take 3 mg today -03/06/15-and then resume 1.5 mg from 8/5 Qty: 45 tablet, Refills: 1      CONTINUE these medications which have NOT CHANGED   Details  acetaminophen (TYLENOL) 500 MG tablet Take 1,000 mg by mouth 2 (two) times daily as needed (pain).     Alpha-D-Galactosidase (BEANO PO) Take 1 tablet by mouth daily.    Apremilast (OTEZLA) 30 MG TABS Take 30 mg by mouth daily.  atorvastatin (LIPITOR) 20 MG tablet Take 20 mg by mouth at bedtime.    dicyclomine (BENTYL) 10 MG capsule Take 1 tab twice daily. Qty: 60 capsule, Refills: 0    famotidine (PEPCID) 20 MG tablet Take 1 tablet (20 mg total) by mouth daily. Qty: 30 tablet, Refills: 3    ferrous sulfate 325 (65 FE) MG tablet Take 325 mg by mouth daily with breakfast.    fluticasone (FLONASE) 50 MCG/ACT nasal spray Place 2 sprays into both nostrils daily as needed for allergies or rhinitis.     Fluticasone-Salmeterol (ADVAIR) 250-50 MCG/DOSE AEPB Inhale 1 puff into the lungs 2 (two) times daily. Qty: 180 each, Refills: 3    insulin lispro (HUMALOG KWIKPEN) 100 UNIT/ML KiwkPen Inject  0.01-0.1 mLs (1-10 Units total) into the skin 3 (three) times daily. Sliding scale CBG 70 - 120: 0 units CBG 121 - 150: 1 unit,  CBG 151 - 200: 2 units,  CBG 201 - 250: 3 units,  CBG 251 - 300: 5 units,  CBG 301 - 350: 7 units,  CBG 351 - 400: 9 units   CBG > 400: 10 units and notify your MD Qty: 15 mL, Refills: 3    loperamide (IMODIUM) 2 MG capsule Take 1 capsule (2 mg total) by mouth 2 (two) times daily as needed for diarrhea or loose stools. Qty: 60 capsule, Refills: 0    Multiple Vitamins-Minerals (MULTIVITAMIN WITH MINERALS) tablet Take 1 tablet by mouth daily.    ondansetron (ZOFRAN) 4 MG tablet Take 1 tablet (4 mg total) by mouth every 8 (eight) hours as needed for nausea or vomiting. Qty: 90 tablet, Refills: 3    promethazine (PHENERGAN) 25 MG tablet Take 1 tablet (25 mg total) by mouth every 4 (four) hours as needed for nausea or vomiting. Qty: 30 tablet, Refills: 0    saccharomyces boulardii (FLORASTOR) 250 MG capsule Take 1 capsule (250 mg total) by mouth 2 (two) times daily. Qty: 60 capsule, Refills: 3    sodium bicarbonate 650 MG tablet Take 1 tablet (650 mg total) by mouth 2 (two) times daily. Qty: 60 tablet, Refills: 3    TOUJEO SOLOSTAR 300 UNIT/ML SOPN Inject 40 Units into the skin every morning. Refills: 3    triamcinolone cream (KENALOG) 0.1 % Apply 1 application topically 2 (two) times daily.    albuterol (PROVENTIL HFA;VENTOLIN HFA) 108 (90 BASE) MCG/ACT inhaler Inhale 2 puffs into the lungs every 4 (four) hours as needed for wheezing or shortness of breath.     albuterol (PROVENTIL) (2.5 MG/3ML) 0.083% nebulizer solution Take 2.5 mg by nebulization every 2 (two) hours as needed for wheezing or shortness of breath.    esomeprazole (NEXIUM) 40 MG capsule Take 1 capsule by mouth 30 min before breakfast. Qty: 30 capsule, Refills: 6    hydrocortisone (ANUSOL-HC) 2.5 % rectal cream Place 1 application rectally 4 (four) times daily. Qty: 30 g, Refills: 0      STOP  taking these medications     metolazone (ZAROXOLYN) 5 MG tablet         ALLERGIES:   Allergies  Allergen Reactions  . Adhesive [Tape] Other (See Comments)    Blisters   . Avelox [Moxifloxacin Hcl In Nacl] Other (See Comments)    GI upset  . Codeine Other (See Comments)    "crazy"   . Guaifenesin Nausea And Vomiting    Takes Mucinex at home without issue  . Latex Swelling  . Oxycodone Nausea And Vomiting  Takes Percocet at home without issue    BRIEF HPI:  See H&P, Labs, Consult and Test reports for all details in brief, patient is a 69 year old female with history of combined chronic systolic and diastolic CHF EF 96-29% recently discharged from this hospital from the CHF service, history of chronic nausea vomiting and abdominal pain, COPD, DM type 2 insulin-dependent. Who was readmitted to the hospital within a few days of her discharge from the cardiology service for reoccurrence of abdominal pain nausea vomiting.  CONSULTATIONS:   cardiology and GI  PERTINENT RADIOLOGIC STUDIES: Dg Chest 2 View  02/24/2015   CLINICAL DATA:  Short of breath and weakness  EXAM: CHEST  2 VIEW  COMPARISON:  CT 02/14/2015  FINDINGS: Normal mediastinum and cardiac silhouette. Chronic central bronchitic markings. Normal pulmonary vasculature. No effusion, infiltrate, or pneumothorax.  IMPRESSION: Mild bronchitic markings.  No acute findings.   Electronically Signed   By: Suzy Bouchard M.D.   On: 02/24/2015 17:02   Ct Chest High Resolution  02/14/2015   CLINICAL DATA:  Sarcoid. Off prednisone, on Coumadin. Evaluate for disease progression. Chronic shortness of breath.  EXAM: CT CHEST WITHOUT CONTRAST  TECHNIQUE: Multidetector CT imaging of the chest was performed following the standard protocol without intravenous contrast. High resolution imaging of the lungs, as well as inspiratory and expiratory imaging, was performed.  COMPARISON:  04/28/2014 and 10/04/2010.  FINDINGS: Mediastinum/Nodes: No  pathologically enlarged mediastinal or axillary lymph nodes. Hilar regions are difficult to definitively evaluate without IV contrast. Atherosclerotic calcification of the arterial vasculature, including coronary arteries. Heart is mildly enlarged. No pericardial effusion.  Lungs/Pleura: Mild linear scarring and volume loss in both lower lobes, unchanged. No peribronchovascular nodularity. No subpleural reticulation, traction bronchiectasis/ bronchiolectasis, ground-glass, architectural distortion or honeycombing. No air trapping. Lungs are otherwise clear. No pleural fluid. Airway is unremarkable.  Upper abdomen: Partially calcified low-attenuation lesion in the posterior dome of the liver measures 1.9 cm and is unchanged over multiple prior exams, indicative of a benign lesion. Visualized portions of the liver and stomach are grossly unremarkable. Splenule in the left upper quadrant.  Musculoskeletal: No worrisome lytic or sclerotic lesions.  IMPRESSION: 1. No CT findings indicative of sarcoid. 2. Coronary artery calcification.   Electronically Signed   By: Lorin Picket M.D.   On: 02/14/2015 08:11     PERTINENT LAB RESULTS: CBC:  Recent Labs  03/05/15 0620  WBC 9.8  HGB 12.4  HCT 37.4  PLT 534*   CMET CMP     Component Value Date/Time   NA 134* 03/06/2015 0601   K 4.0 03/06/2015 0601   CL 99* 03/06/2015 0601   CO2 23 03/06/2015 0601   GLUCOSE 165* 03/06/2015 0601   BUN 66* 03/06/2015 0601   CREATININE 4.67* 03/06/2015 0601   CALCIUM 9.2 03/06/2015 0601   CALCIUM 12.8* 10/17/2012 1734   PROT 6.8 03/01/2015 0445   ALBUMIN 2.4* 03/02/2015 0733   AST 20 03/01/2015 0445   ALT 14 03/01/2015 0445   ALKPHOS 156* 03/01/2015 0445   BILITOT 0.7 03/01/2015 0445   GFRNONAA 9* 03/06/2015 0601   GFRAA 10* 03/06/2015 0601    GFR Estimated Creatinine Clearance: 11.5 mL/min (by C-G formula based on Cr of 4.67). No results for input(s): LIPASE, AMYLASE in the last 72 hours. No results for  input(s): CKTOTAL, CKMB, CKMBINDEX, TROPONINI in the last 72 hours. Invalid input(s): POCBNP No results for input(s): DDIMER in the last 72 hours. No results for input(s): HGBA1C in the last  72 hours. No results for input(s): CHOL, HDL, LDLCALC, TRIG, CHOLHDL, LDLDIRECT in the last 72 hours. No results for input(s): TSH, T4TOTAL, T3FREE, THYROIDAB in the last 72 hours.  Invalid input(s): FREET3 No results for input(s): VITAMINB12, FOLATE, FERRITIN, TIBC, IRON, RETICCTPCT in the last 72 hours. Coags:  Recent Labs  03/05/15 0620 03/06/15 0601  INR 3.45* 2.03*   Microbiology: Recent Results (from the past 240 hour(s))  MRSA PCR Screening     Status: None   Collection Time: 03/02/15  7:01 PM  Result Value Ref Range Status   MRSA by PCR NEGATIVE NEGATIVE Final    Comment:        The GeneXpert MRSA Assay (FDA approved for NASAL specimens only), is one component of a comprehensive MRSA colonization surveillance program. It is not intended to diagnose MRSA infection nor to guide or monitor treatment for MRSA infections.   Urine culture     Status: None   Collection Time: 03/04/15 12:35 AM  Result Value Ref Range Status   Specimen Description URINE, RANDOM  Final   Special Requests NONE  Final   Culture >=100,000 COLONIES/mL ESCHERICHIA COLI  Final   Report Status 03/06/2015 FINAL  Final   Organism ID, Bacteria ESCHERICHIA COLI  Final      Susceptibility   Escherichia coli - MIC*    AMPICILLIN <=2 SENSITIVE Sensitive     CEFAZOLIN <=4 SENSITIVE Sensitive     CEFTRIAXONE <=1 SENSITIVE Sensitive     CIPROFLOXACIN <=0.25 SENSITIVE Sensitive     GENTAMICIN <=1 SENSITIVE Sensitive     IMIPENEM <=0.25 SENSITIVE Sensitive     NITROFURANTOIN <=16 SENSITIVE Sensitive     TRIMETH/SULFA <=20 SENSITIVE Sensitive     AMPICILLIN/SULBACTAM <=2 SENSITIVE Sensitive     PIP/TAZO <=4 SENSITIVE Sensitive     * >=100,000 COLONIES/mL ESCHERICHIA COLI  C difficile quick scan w PCR reflex      Status: Abnormal   Collection Time: 03/04/15 12:15 PM  Result Value Ref Range Status   C Diff antigen POSITIVE (A) NEGATIVE Final   C Diff toxin NEGATIVE NEGATIVE Final   C Diff interpretation   Final    C. difficile present, but toxin not detected. This indicates colonization. In most cases, this does not require treatment. If patient has signs and symptoms consistent with colitis, consider treatment.     BRIEF HOSPITAL COURSE:  Abdominal pain: Thought to be secondary to mesenteric ischemia from low flow state. Slowly improving with supportive measures. Diet slowly advanced to regular diet which he seems to be tolerating well. Her abdomen remained soft.No further recommendations from GI  Active Problems: Mild acute on chronic systolic CHF:EF 88-28% on echo,suspected ischemic cardiomyopathy. Continue Lasix 80 mg twice a day, Coreg and BiDil. CHF team following. Medications have been adjusted by CHF/nephrology to allow blood pressure to increase somewhat.Follow up with CHF clinic-see below  Acute on chronic kidney disease stage IV: Suspected cardiorenal syndrome. Nephrology followed this patient while hospitalized, creatinine now down trending. Okay for nephrology to discharge. Nephrology will arrange follow-up at the clinic. f  Syncopal episode: Doubt cardiac etiology, no major arrhythmias seen on telemetry. May be from? Hypotensive episodes. no further workup recommended   LV thrombus: Continue Coumadin-on discharge. Recommended that patient take 3 mg today, followed by her usual home regimen from tomorrow. She has been asked to go to her PCPs office for a INR check in the next few days.   Presumed gout: Continue prednisone 3 days from 8/3  History of diabetes: CBGs stable with 40 units of Lantus and SSI. Follow  History of hypertension: Allow for some permissive hypertension to increase renal perfusion-continue Coreg 6.25 mg twice a day, Lasix 80 mg twice a day, and BiDil. Follow and  optimize in the outpatient setting   GERD: Continue PPI  WGY:[id number] cystitis-will treat with Keflex for 3 days  History of pulmonary hypertension: PA pressure 62 mmHg by last echo on 6/21  TODAY-DAY OF DISCHARGE:  Subjective:   [place] today has no headache,no chest abdominal pain,no new weakness tingling or numbness, feels much better wants to go home today.  she does not have any further abdominal pain, is tolerating diet well.   Objective:   Blood pressure 143/67, pulse 76, temperature 97.5 F (36.4 C), temperature source Oral, resp. rate 19, height 5\' 2"  (1.575 m), weight 78.472 kg (173 lb), SpO2 97 %.  Intake/Output Summary (Last 24 hours) at 03/06/15 1535 Last data filed at 03/06/15 1435  Gross per 24 hour  Intake    723 ml  Output   1300 ml  Net   -577 ml   Filed Weights   03/04/15 0340 03/05/15 0455 03/06/15 0614  Weight: 77.3 kg (170 lb 6.7 oz) 76.34 kg (168 lb 4.8 oz) 78.472 kg (173 lb)    Exam Awake Alert, Oriented *3, No new F.N deficits, Normal affect [place].AT,PERRAL Supple Neck,No JVD, No cervical lymphadenopathy appriciated.  Symmetrical Chest wall movement, Good air movement bilaterally, CTAB RRR,No Gallops,Rubs or new Murmurs, No Parasternal Heave +ve B.Sounds, Abd Soft, Non tender, No organomegaly appriciated, No rebound -guarding or rigidity. No Cyanosis, Clubbing or edema, No new Rash or bruise  DISCHARGE CONDITION: Stable  DISPOSITION: Home with home health services  DISCHARGE INSTRUCTIONS:    Activity:  As tolerated with Full fall precautions use walker/cane & assistance as needed  Diet recommendation: Diabetic Diet Heart Healthy diet   Discharge Instructions    Diet - low sodium heart healthy    Complete by:  As directed      Increase activity slowly    Complete by:  As directed            Follow-up Information    Follow up with [facility] On 03/06/2015.   Why:  HHRN, PT, OT   Contact information:    [address] [place] [address] [phone]       Follow up with Loralie Champagne, MD. Go on 03/26/2015.   Specialty:  Cardiology   Why:  at 0920 am in [facility]--gate code [id number]--please bring all medications to appt   Contact information:   [address] [place] [address] [phone]       Follow up with FULP, CAMMIE, MD. Schedule an appointment as soon as possible for a visit in 1 week.   Specialty:  Family Medicine   Contact information:   [address] [address] [phone]       Follow up with GOLDSBOROUGH,KELLIE A, MD. Schedule an appointment as soon as possible for a visit in 2 weeks.   Specialty:  Nephrology   Contact information:   [place] [address] [phone]      Total Time spent on discharge equals  45 minutes.  SignedOren Binet 03/06/2015 3:35 PM

## 2015-03-06 NOTE — Consult Note (Signed)
   [facility] CM Inpatient Consult   03/06/2015  [name] [birth year] [id number]  Referral received. Patient evaluated for [place] Management services. Patient is not eligible for [facility] Care Management services because unfortunately, patient's Blue Medicare is not in the delegation for [place] [facility] services at this time. Will update inpatient care manager of outcome. For questions, please contact: Natividad Brood, RN BSN [facility] Liaison  [phone] business mobile phone

## 2015-03-06 NOTE — Progress Notes (Signed)
Subjective:  UOP reasonable- creatinine down today ! Says still having abdominal pain mild after eating but cleaned plate this AM- sitting up looks good  Objective Vital signs in last 24 hours: Filed Vitals:   03/05/15 2008 03/05/15 2146 03/06/15 0614 03/06/15 0850  BP:  141/64 173/74   Pulse: 81 73 71   Temp:  98.2 F (36.8 C) 98.3 F (36.8 C)   TempSrc:  Oral Oral   Resp: 16 18 20    Height:      Weight:   78.472 kg (173 lb)   SpO2: 100% 97% 95% 99%   Weight change: 2.132 kg (4 lb 11.2 oz)  Intake/Output Summary (Last 24 hours) at 03/06/15 0940 Last data filed at 03/06/15 0831  Gross per 24 hour  Intake    360 ml  Output   1120 ml  Net   -760 ml    Assessment/Plan: 69 year old black female with multiple medical issues including 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 She presented with abdominal symptoms felt to possibly be consistent with mesenteric ischemia. She now has acute on chronic renal failure  1.Renal- acute on chronic renal failure. Patient is a history of advanced chronic kidney disease being very close to dialysis requiring. She has suffered worsening of her renal function this hospitalization that is coincident in time with significantly low blood pressure. Therefore I suspect some ATN secondary to renal hypoperfusion. I put some parameters on her blood pressure medications and her blood pressure seems higher at this time. Finally it seems that  renal function is improving. There are no acute indications for dialysis at this time and feel fairly confident that renal function will trend better from here.  Clinically she is well so I am OK with discharge on lasix 80 BID- lower dose coreg and no bidil  2. Hypertension/volume - better on lower dose coreg- no bidil and lower dose lasix- volume seems OK- it does not seem that her gut or kidneys will tolerate blood pressure too low  3. Anemia - is actually not an issue at this time. Because she is having  hemorrhoid issues I decided to stop her iron pill 4. GI symptoms- patient states better at this time. GI has seen. Leading thought on diagnosis was mesenteric ischemia- may be having intermittent episodes 5. Presumed gout- placed on prednisone- better- will cause BUN to rise but creatinine down  I am ok with discharge-   I will make sure there is appropriate follow up at [place] for pt    English: Basic Metabolic Panel:  Recent Labs Lab 03/01/15 1154 03/02/15 0733  03/04/15 0248 03/05/15 0620 03/06/15 0601  NA  --  138  < > 131* 137 134*  K  --  3.1*  < > 5.6* 4.6 4.0  CL  --  108  < > 105 106 99*  CO2  --  20*  < > 18* 22 23  GLUCOSE  --  102*  < > 72 89 165*  BUN  --  48*  < > 53* 59* 66*  CREATININE  --  3.70*  < > 4.91* 5.08* 4.67*  CALCIUM  --  9.0  < > 8.9 9.3 9.2  PHOS 4.6 6.2*  --   --   --   --   < > = values in this interval not displayed. Liver Function Tests:  Recent Labs Lab 03/01/15 0445 03/02/15 0733  AST 20  --   ALT  14  --   ALKPHOS 156*  --   BILITOT 0.7  --   PROT 6.8  --   ALBUMIN 2.7* 2.4*    Recent Labs Lab 03/01/15 0445  LIPASE 24   No results for input(s): AMMONIA in the last 168 hours. CBC:  Recent Labs Lab 03/01/15 0445 03/02/15 0733 03/05/15 0620  WBC 10.1 9.6 9.8  HGB 14.0 13.8 12.4  HCT 41.4 43.0 37.4  MCV 85.9 88.1 88.8  PLT 809* 712* 534*   Cardiac Enzymes: No results for input(s): CKTOTAL, CKMB, CKMBINDEX, TROPONINI in the last 168 hours. CBG:  Recent Labs Lab 03/05/15 0833 03/05/15 1207 03/05/15 1726 03/05/15 2158 03/06/15 0742  GLUCAP 78 169* 376* 271* 162*    Iron Studies: No results for input(s): IRON, TIBC, TRANSFERRIN, FERRITIN in the last 72 hours. Studies/Results: No results found. Medications: Infusions:    Scheduled Medications: . allopurinol  100 mg Oral Daily  . antiseptic oral rinse  7 mL Mouth Rinse q12n4p  . atorvastatin  20 mg Oral QHS  . carvedilol  6.25 mg Oral  BID WC  . chlorhexidine  15 mL Mouth Rinse BID  . dicyclomine  10 mg Oral TID AC  . famotidine  20 mg Oral Daily  . feeding supplement  1 Container Oral TID BM  . feeding supplement (GLUCERNA 1.2 CAL)  237 mL Oral TID BM  . feeding supplement (PRO-STAT SUGAR FREE 64)  30 mL Oral BID WC  . furosemide  80 mg Oral BID  . hydrocortisone  1 application Rectal QID  . insulin aspart  0-9 Units Subcutaneous TID WC  . insulin glargine  40 Units Subcutaneous q morning - 10a  . isosorbide-hydrALAZINE  0.5 tablet Oral TID  . mometasone-formoterol  2 puff Inhalation BID  . multivitamin with minerals  1 tablet Oral Daily  . pantoprazole  40 mg Oral Daily  . predniSONE  40 mg Oral Q breakfast  . saccharomyces boulardii  250 mg Oral BID  . sodium bicarbonate  650 mg Oral BID  . sodium chloride  3 mL Intravenous Q12H  . Warfarin - Pharmacist Dosing Inpatient   Does not apply q1800    have reviewed scheduled and prn medications.  Physical Exam: General: NAD- conversive Heart: RRR Lungs: mostly clear Abdomen: soft, obese, non tender Extremities: minimal edema Dialysis Access: none yet    03/06/2015,9:40 AM  LOS: 5 days

## 2015-03-06 NOTE — Clinical Documentation Improvement (Signed)
Possible Clinical Conditions?  Severe Malnutrition   Protein Calorie Malnutrition Severe Protein Calorie Malnutrition Other Condition Cannot clinically determine  Supporting Information: As per Initial Nutrition Assessment Baird Lyons, RD at 2015-04-01 3:00 PM  DOCUMENTATION CODES:  Severe malnutrition in context of acute illness/injury, Severe malnutrition in context of chronic illness  INTERVENTION:  Boost Breeze po TID, each supplement provides 250 kcal and 9 grams of protein  Provide 30 ml Pro-stat BID, each dose provides 100 kcal and 15 grams of protein  Provide Mighty Shakes with meals when diet is advanced past clear liquids  Provide Multivitamin with minerals daily  NUTRITION DIAGNOSIS:  Malnutrition related to vomiting, nausea as evidenced by energy intake < 75% for > or equal to 1 month, moderate depletion of body fat, moderate depletions of muscle mass, percent weight loss.   Thank You, Alessandra Grout, RN, BSN, CCDS,Clinical Documentation Specialist:  [phone]  [phone]=[facility]- Health Information Management

## 2015-03-06 NOTE — Care Management Note (Addendum)
Case Management Note  Patient Details  Name: [name] MRN: [id number] Date of Birth: 06/22/1946  Subjective/Objective:         Patient is active with [facility] for [facility], PT, OT, she would like to continue with them, she uses rolling walker at home.  Referral made to [place] with [facility].  Patient states she does have transportation at [employer] and she uses [facility] local meds and she also uses Mail order pharmacy.             Action/Plan:   Expected Discharge Date:                  Expected Discharge Plan:  [place]  In-House Referral:     Discharge planning Services  CM Consult  Post Acute Care Choice:  Resumption of Svcs/PTA Provider Choice offered to:  Patient  DME Arranged:    DME Agency:     HH Arranged:  RN, PT, OT HH Agency:  [place]  Status of Service:  Completed, signed off  Medicare Important Message Given:  Yes-second notification given Date Medicare IM Given:    Medicare IM give by:    Date Additional Medicare IM Given:    Additional Medicare Important Message give by:     If discussed at [place] of Stay Meetings, dates discussed:    Additional Comments:  Zenon Mayo, RN 03/06/2015, 2:39 PM

## 2015-03-09 ENCOUNTER — Emergency Department (HOSPITAL_COMMUNITY): Payer: Medicare Other

## 2015-03-09 ENCOUNTER — Inpatient Hospital Stay (HOSPITAL_COMMUNITY)
Admission: EM | Admit: 2015-03-09 | Discharge: 2015-03-16 | DRG: 394 | Disposition: A | Discharge status: Home / Self Care | Payer: Medicare Other | Attending: Family Medicine | Admitting: Family Medicine

## 2015-03-09 ENCOUNTER — Encounter (HOSPITAL_COMMUNITY): Payer: Self-pay | Admitting: *Deleted

## 2015-03-09 DIAGNOSIS — K5730 Diverticulosis of large intestine without perforation or abscess without bleeding: Secondary | ICD-10-CM | POA: Diagnosis present

## 2015-03-09 DIAGNOSIS — E876 Hypokalemia: Secondary | ICD-10-CM | POA: Diagnosis not present

## 2015-03-09 DIAGNOSIS — Z7901 Long term (current) use of anticoagulants: Secondary | ICD-10-CM | POA: Diagnosis not present

## 2015-03-09 DIAGNOSIS — I10 Essential (primary) hypertension: Secondary | ICD-10-CM | POA: Diagnosis present

## 2015-03-09 DIAGNOSIS — E872 Acidosis: Secondary | ICD-10-CM

## 2015-03-09 DIAGNOSIS — Z9981 Dependence on supplemental oxygen: Secondary | ICD-10-CM

## 2015-03-09 DIAGNOSIS — I240 Acute coronary thrombosis not resulting in myocardial infarction: Secondary | ICD-10-CM | POA: Diagnosis present

## 2015-03-09 DIAGNOSIS — Z7952 Long term (current) use of systemic steroids: Secondary | ICD-10-CM | POA: Diagnosis not present

## 2015-03-09 DIAGNOSIS — N184 Chronic kidney disease, stage 4 (severe): Secondary | ICD-10-CM | POA: Diagnosis present

## 2015-03-09 DIAGNOSIS — I2510 Atherosclerotic heart disease of native coronary artery without angina pectoris: Secondary | ICD-10-CM | POA: Diagnosis present

## 2015-03-09 DIAGNOSIS — I130 Hypertensive heart and chronic kidney disease with heart failure and stage 1 through stage 4 chronic kidney disease, or unspecified chronic kidney disease: Secondary | ICD-10-CM | POA: Diagnosis present

## 2015-03-09 DIAGNOSIS — J449 Chronic obstructive pulmonary disease, unspecified: Secondary | ICD-10-CM | POA: Diagnosis present

## 2015-03-09 DIAGNOSIS — I700 Atherosclerosis of aorta: Secondary | ICD-10-CM | POA: Insufficient documentation

## 2015-03-09 DIAGNOSIS — M109 Gout, unspecified: Secondary | ICD-10-CM | POA: Diagnosis present

## 2015-03-09 DIAGNOSIS — J438 Other emphysema: Secondary | ICD-10-CM | POA: Diagnosis not present

## 2015-03-09 DIAGNOSIS — E785 Hyperlipidemia, unspecified: Secondary | ICD-10-CM | POA: Diagnosis present

## 2015-03-09 DIAGNOSIS — I5022 Chronic systolic (congestive) heart failure: Secondary | ICD-10-CM | POA: Diagnosis present

## 2015-03-09 DIAGNOSIS — I272 Other secondary pulmonary hypertension: Secondary | ICD-10-CM | POA: Diagnosis present

## 2015-03-09 DIAGNOSIS — K550 Acute vascular disorders of intestine: Secondary | ICD-10-CM | POA: Diagnosis present

## 2015-03-09 DIAGNOSIS — G43A0 Cyclical vomiting, not intractable: Secondary | ICD-10-CM | POA: Diagnosis not present

## 2015-03-09 DIAGNOSIS — E860 Dehydration: Secondary | ICD-10-CM | POA: Diagnosis not present

## 2015-03-09 DIAGNOSIS — K635 Polyp of colon: Secondary | ICD-10-CM | POA: Diagnosis present

## 2015-03-09 DIAGNOSIS — R1110 Vomiting, unspecified: Secondary | ICD-10-CM | POA: Diagnosis not present

## 2015-03-09 DIAGNOSIS — E861 Hypovolemia: Secondary | ICD-10-CM | POA: Diagnosis present

## 2015-03-09 DIAGNOSIS — E662 Morbid (severe) obesity with alveolar hypoventilation: Secondary | ICD-10-CM | POA: Diagnosis present

## 2015-03-09 DIAGNOSIS — Z96653 Presence of artificial knee joint, bilateral: Secondary | ICD-10-CM | POA: Diagnosis present

## 2015-03-09 DIAGNOSIS — J9610 Chronic respiratory failure, unspecified whether with hypoxia or hypercapnia: Secondary | ICD-10-CM | POA: Diagnosis present

## 2015-03-09 DIAGNOSIS — Z794 Long term (current) use of insulin: Secondary | ICD-10-CM

## 2015-03-09 DIAGNOSIS — R7989 Other specified abnormal findings of blood chemistry: Secondary | ICD-10-CM

## 2015-03-09 DIAGNOSIS — Z452 Encounter for adjustment and management of vascular access device: Secondary | ICD-10-CM

## 2015-03-09 DIAGNOSIS — R1033 Periumbilical pain: Secondary | ICD-10-CM | POA: Insufficient documentation

## 2015-03-09 DIAGNOSIS — I5042 Chronic combined systolic (congestive) and diastolic (congestive) heart failure: Secondary | ICD-10-CM | POA: Diagnosis present

## 2015-03-09 DIAGNOSIS — N179 Acute kidney failure, unspecified: Secondary | ICD-10-CM | POA: Diagnosis present

## 2015-03-09 DIAGNOSIS — R112 Nausea with vomiting, unspecified: Secondary | ICD-10-CM | POA: Diagnosis present

## 2015-03-09 DIAGNOSIS — D860 Sarcoidosis of lung: Secondary | ICD-10-CM | POA: Diagnosis present

## 2015-03-09 DIAGNOSIS — K219 Gastro-esophageal reflux disease without esophagitis: Secondary | ICD-10-CM | POA: Diagnosis present

## 2015-03-09 DIAGNOSIS — E1122 Type 2 diabetes mellitus with diabetic chronic kidney disease: Secondary | ICD-10-CM | POA: Diagnosis present

## 2015-03-09 DIAGNOSIS — D473 Essential (hemorrhagic) thrombocythemia: Secondary | ICD-10-CM

## 2015-03-09 DIAGNOSIS — R109 Unspecified abdominal pain: Secondary | ICD-10-CM

## 2015-03-09 DIAGNOSIS — Z6831 Body mass index (BMI) 31.0-31.9, adult: Secondary | ICD-10-CM | POA: Diagnosis not present

## 2015-03-09 DIAGNOSIS — D75839 Thrombocytosis, unspecified: Secondary | ICD-10-CM | POA: Diagnosis present

## 2015-03-09 DIAGNOSIS — G43A1 Cyclical vomiting, intractable: Secondary | ICD-10-CM | POA: Diagnosis not present

## 2015-03-09 DIAGNOSIS — D649 Anemia, unspecified: Secondary | ICD-10-CM | POA: Diagnosis present

## 2015-03-09 DIAGNOSIS — I252 Old myocardial infarction: Secondary | ICD-10-CM | POA: Diagnosis not present

## 2015-03-09 DIAGNOSIS — E871 Hypo-osmolality and hyponatremia: Secondary | ICD-10-CM | POA: Diagnosis present

## 2015-03-09 DIAGNOSIS — D125 Benign neoplasm of sigmoid colon: Secondary | ICD-10-CM | POA: Diagnosis not present

## 2015-03-09 DIAGNOSIS — R197 Diarrhea, unspecified: Secondary | ICD-10-CM | POA: Diagnosis not present

## 2015-03-09 DIAGNOSIS — I513 Intracardiac thrombosis, not elsewhere classified: Secondary | ICD-10-CM | POA: Diagnosis present

## 2015-03-09 LAB — URINALYSIS, ROUTINE W REFLEX MICROSCOPIC
BILIRUBIN URINE: NEGATIVE
Glucose, UA: 100 mg/dL — AB
Hgb urine dipstick: NEGATIVE
KETONES UR: NEGATIVE mg/dL
Leukocytes, UA: NEGATIVE
Nitrite: NEGATIVE
Protein, ur: >300 mg/dL — AB
Specific Gravity, Urine: 1.011 (ref 1.005–1.030)
UROBILINOGEN UA: 0.2 mg/dL (ref 0.0–1.0)
pH: 7 (ref 5.0–8.0)

## 2015-03-09 LAB — I-STAT CG4 LACTIC ACID, ED
Lactic Acid, Venous: 3.59 mmol/L (ref 0.5–2.0)
Lactic Acid, Venous: 0.85 mmol/L (ref 0.5–2.0)

## 2015-03-09 LAB — COMPREHENSIVE METABOLIC PANEL
ALBUMIN: 2.8 g/dL — AB (ref 3.5–5.0)
ALT: 19 U/L (ref 14–54)
AST: 32 U/L (ref 15–41)
Alkaline Phosphatase: 148 U/L — ABNORMAL HIGH (ref 38–126)
Anion gap: 15 (ref 5–15)
BUN: 76 mg/dL — ABNORMAL HIGH (ref 6–20)
CALCIUM: 9.8 mg/dL (ref 8.9–10.3)
CO2: 22 mmol/L (ref 22–32)
Chloride: 96 mmol/L — ABNORMAL LOW (ref 101–111)
Creatinine, Ser: 3.9 mg/dL — ABNORMAL HIGH (ref 0.44–1.00)
GFR calc non Af Amer: 11 mL/min — ABNORMAL LOW (ref >60)
GFR, EST AFRICAN AMERICAN: 13 mL/min — AB (ref >60)
Glucose, Bld: 173 mg/dL — ABNORMAL HIGH (ref 65–99)
Potassium: 3.7 mmol/L (ref 3.5–5.1)
SODIUM: 133 mmol/L — AB (ref 135–145)
Total Bilirubin: 0.8 mg/dL (ref 0.3–1.2)
Total Protein: 6.4 g/dL — ABNORMAL LOW (ref 6.5–8.1)

## 2015-03-09 LAB — CBC
HEMATOCRIT: 42 % (ref 36.0–46.0)
HEMOGLOBIN: 14.2 g/dL (ref 12.0–15.0)
MCH: 29.2 pg (ref 26.0–34.0)
MCHC: 33.8 g/dL (ref 30.0–36.0)
MCV: 86.4 fL (ref 78.0–100.0)
Platelets: 546 10*3/uL — ABNORMAL HIGH (ref 150–400)
RBC: 4.86 MIL/uL (ref 3.87–5.11)
RDW: 16.7 % — ABNORMAL HIGH (ref 11.5–15.5)
WBC: 12.9 10*3/uL — ABNORMAL HIGH (ref 4.0–10.5)

## 2015-03-09 LAB — LIPASE, BLOOD: Lipase: 50 U/L (ref 22–51)

## 2015-03-09 LAB — PROTIME-INR
INR: 1.84 — AB (ref 0.00–1.49)
PROTHROMBIN TIME: 21.2 s — AB (ref 11.6–15.2)

## 2015-03-09 LAB — URINE MICROSCOPIC-ADD ON

## 2015-03-09 MED ORDER — IOHEXOL 300 MG/ML  SOLN
50.0000 mL | Freq: Once | INTRAMUSCULAR | Status: AC | PRN
  Administered 2015-03-09: 50 mL via ORAL

## 2015-03-09 MED ORDER — DIPHENHYDRAMINE HCL 50 MG/ML IJ SOLN
25.0000 mg | Freq: Once | INTRAMUSCULAR | Status: DC
  Filled 2015-03-09: qty 1

## 2015-03-09 MED ORDER — ONDANSETRON 4 MG PO TBDP
8.0000 mg | ORAL_TABLET | Freq: Once | ORAL | Status: AC
Start: 2015-03-09 — End: 2015-03-09
  Administered 2015-03-09: 8 mg via ORAL
  Filled 2015-03-09: qty 2

## 2015-03-09 MED ORDER — SODIUM CHLORIDE 0.9 % IV SOLN: 1000.0000 mL | INTRAVENOUS | Status: DC

## 2015-03-09 MED ORDER — DIPHENHYDRAMINE HCL 50 MG/ML IJ SOLN
25.0000 mg | Freq: Once | INTRAMUSCULAR | Status: AC
  Administered 2015-03-09: 25 mg via INTRAMUSCULAR

## 2015-03-09 MED ORDER — ONDANSETRON 4 MG PO TBDP
4.0000 mg | ORAL_TABLET | Freq: Once | ORAL | Status: AC | PRN
  Administered 2015-03-09: 4 mg via ORAL

## 2015-03-09 MED ORDER — OXYCODONE-ACETAMINOPHEN 5-325 MG PO TABS
1.0000 | ORAL_TABLET | Freq: Once | ORAL | Status: AC
  Administered 2015-03-09: 1 via ORAL

## 2015-03-09 MED ORDER — SODIUM CHLORIDE 0.9 % IV SOLN: 1000.0000 mL | Freq: Once | INTRAVENOUS | Status: DC

## 2015-03-09 MED ORDER — PANTOPRAZOLE SODIUM 40 MG IV SOLR
40.0000 mg | INTRAVENOUS | Status: DC
  Administered 2015-03-10 – 2015-03-12 (×2): 40 mg via INTRAVENOUS
  Filled 2015-03-09 (×2): qty 40

## 2015-03-09 MED ORDER — HYDROMORPHONE HCL 1 MG/ML IJ SOLN
2.0000 mg | Freq: Once | INTRAMUSCULAR | Status: AC
  Administered 2015-03-09: 2 mg via INTRAMUSCULAR
  Filled 2015-03-09: qty 2

## 2015-03-09 MED ORDER — HYDROMORPHONE HCL 1 MG/ML IJ SOLN: 1.0000 mg | Freq: Once | INTRAMUSCULAR | Status: DC

## 2015-03-09 MED ORDER — ONDANSETRON HCL 4 MG/2ML IJ SOLN
4.0000 mg | Freq: Once | INTRAMUSCULAR | Status: DC
  Filled 2015-03-09: qty 2

## 2015-03-09 MED ORDER — ONDANSETRON 4 MG PO TBDP
ORAL_TABLET | ORAL | Status: AC
Start: 2015-03-09 — End: 2015-03-10
  Filled 2015-03-09: qty 1

## 2015-03-09 MED ORDER — PANTOPRAZOLE SODIUM 40 MG IV SOLR: 40.0000 mg | Freq: Two times a day (BID) | INTRAVENOUS | Status: DC

## 2015-03-09 MED ORDER — OXYCODONE-ACETAMINOPHEN 5-325 MG PO TABS
ORAL_TABLET | ORAL | Status: AC
  Filled 2015-03-09: qty 1

## 2015-03-09 NOTE — ED Notes (Addendum)
Dr [name] Mins given a copy of lactic acid results 3.59

## 2015-03-09 NOTE — ED Provider Notes (Addendum)
CSN: [id number]     Arrival date & time 03/09/15  1405 History   First MD Initiated Contact with Patient 03/09/15 1612     Chief Complaint  Patient presents with  . Abdominal Pain  . Emesis  . Diarrhea     (Consider location/radiation/quality/duration/timing/severity/associated sxs/prior Treatment) Patient is a 69 y.o. female presenting with abdominal pain, vomiting, and diarrhea. The history is provided by the patient.  Abdominal Pain Associated symptoms: diarrhea and vomiting   Emesis Associated symptoms: abdominal pain and diarrhea   Diarrhea Associated symptoms: abdominal pain and vomiting   She had been discharged from the hospital 3 days ago and had done reasonably well until yesterday when she stopped about severe, generalized abdominal pain. She vomited once and had one loose bowel movement. Pain did not improve after vomiting but was momentarily better after diarrhea of. Since then, she has not had any bowel movement and continues to have nausea but has not vomited again. There is been no fever or chills but pain is severe and she rates it at 8/10. She's been having episodes like this for the past several months and has been in the hospital several times for similar symptoms. There has been a suggestion of possible mesenteric ischemia. She does have stage IV renal disease which is precluded getting angiographic data. She barely had been tolerating solid food on discharge from the hospital.  Past Medical History  Diagnosis Date  . Essential hypertension, benign   . Coronary atherosclerosis of native coronary artery 01/04/2006    Tiny OM1 70-90% ostial stenosis, no other CAD  . COPD (chronic obstructive pulmonary disease)   . Esophageal reflux   . Dyslipidemia   . Gout   . Colon polyps 2011    Tubular adenomatous polyps  . Spinal stenosis of lumbar region     chronic low back pain.   . Bulging lumbar disc   . Chronic diastolic heart failure   . PNA (pneumonia) 2012    With  pleural effusion, requiring thoracentesis  . Chronic bronchitis   . On home oxygen therapy   . OSA (obstructive sleep apnea)     Marginally compliant with CPAP  . Type II diabetes mellitus   . Arthritis   . Diverticulosis   . GERD (gastroesophageal reflux disease)   . Anxiety   . Hyperlipidemia   . Sarcoidosis of lung   . CKD (chronic kidney disease) stage 4, GFR 15-29 ml/min   . IBS (irritable bowel syndrome)   . Cataracts, both eyes   . Headache(784.0)   . Lactic acidosis 01/21/2015  . Chronic CHF (congestive heart failure)    Past Surgical History  Procedure Laterality Date  . Vesicovaginal fistula closure w/  total abdominal hysterectomy  1992  . Bilateral total knee replacements Bilateral Rt=5/04 & Lft=1/09    by DrAlusio  . 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 Zella Richer  . Appendectomy  1957  . Tonsillectomy  1968  . Cholecystectomy  1980's  . Abdominal hysterectomy  1992  . Reduction mammaplasty Bilateral 1980's  . Cardiac catheterization  1990's  . Thoracentesis  2012  . Joint replacement    . Av fistula placement Left 12/31/2013    Procedure: ARTERIOVENOUS (AV) FISTULA CREATION;  Surgeon: Elam Dutch, MD;  Location: [facility];  Service: Vascular;  Laterality: Left;  . Colonoscopy w/ biopsies and polypectomy    . Av fistula placement Left 03/18/2014    Procedure: ARTERIOVENOUS (AV) FISTULA CREATION- LEFT BRACHIOCEPHALIC ;  Surgeon: Elam Dutch, MD;  Location[facility];  Service: Vascular;  Laterality: Left;  . Ligation of arteriovenous  fistula Left 03/18/2014    Procedure: LIGATION OF ARTERIOVENOUS  FISTULA- LEFT RADIOCEPHALIC;  Surgeon: Elam Dutch, MD;  Location: [place];  Service: Vascular;  Laterality: Left;  . Cardiac catheterization N/A 01/27/2015    Procedure: Right Heart Cath;  Surgeon: Larey Dresser, MD;  Location: [facility];  Service: Cardiovascular;  Laterality: N/A;   Family History  Problem Relation Age of Onset  . Diabetes Mother   .  Heart disease Mother   . Hyperlipidemia Mother   . Varicose Veins Mother   . Breast cancer Maternal Aunt   . Heart disease Father   . Deep vein thrombosis Father   . Hyperlipidemia Father   . Heart disease Sister     before age 97  . Cancer Sister   . Diabetes Sister   . Hyperlipidemia Sister   . Heart attack Sister   . Heart disease Brother   . Diabetes Brother   . Hyperlipidemia Brother   . Heart attack Brother   . Hyperlipidemia Sister    History  Substance Use Topics  . Smoking status: Never Smoker   . Smokeless tobacco: Never Used  . Alcohol Use: No   OB History    No data available     Review of Systems  Gastrointestinal: Positive for vomiting, abdominal pain and diarrhea.  All other systems reviewed and are negative.     Allergies  Adhesive; Avelox; Codeine; Guaifenesin; Latex; and Oxycodone  Home Medications   Prior to Admission medications   Medication Sig Start Date End Date Taking? Authorizing Provider  acetaminophen (TYLENOL) 500 MG tablet Take 1,000 mg by mouth 2 (two) times daily as needed (pain).     Historical Provider, MD  albuterol (PROVENTIL HFA;VENTOLIN HFA) 108 (90 BASE) MCG/ACT inhaler Inhale 2 puffs into the lungs every 4 (four) hours as needed for wheezing or shortness of breath.     Historical Provider, MD  albuterol (PROVENTIL) (2.5 MG/3ML) 0.083% nebulizer solution Take 2.5 mg by nebulization every 2 (two) hours as needed for wheezing or shortness of breath.    Historical Provider, MD  Alpha-D-Galactosidase (BEANO PO) Take 1 tablet by mouth daily.    Historical Provider, MD  Apremilast (OTEZLA) 30 MG TABS Take 30 mg by mouth daily.     Historical Provider, MD  atorvastatin (LIPITOR) 20 MG tablet Take 20 mg by mouth at bedtime.    Historical Provider, MD  carvedilol (COREG) 12.5 MG tablet Take 0.5 tablets (6.25 mg total) by mouth 2 (two) times daily with a meal. 03/06/15   Shanker Kristeen Mans, MD  cephALEXin (KEFLEX) 250 MG capsule Take 1  capsule (250 mg total) by mouth 2 (two) times daily. 03/06/15   Shanker Kristeen Mans, MD  dicyclomine (BENTYL) 10 MG capsule Take 1 tab twice daily. 02/28/15   Amy S Esterwood, PA-C  esomeprazole (NEXIUM) 40 MG capsule Take 1 capsule by mouth 30 min before breakfast. 03/03/15   Amy S Esterwood, PA-C  famotidine (PEPCID) 20 MG tablet Take 1 tablet (20 mg total) by mouth daily. 02/02/15   Ripudeep Krystal Eaton, MD  feeding supplement (BOOST / RESOURCE BREEZE) LIQD Take 1 Container by mouth 3 (three) times daily between meals. 03/06/15   Shanker Kristeen Mans, MD  ferrous sulfate 325 (65 FE) MG tablet Take 325 mg by mouth daily with breakfast.    Historical Provider,  MD  fluticasone (FLONASE) 50 MCG/ACT nasal spray Place 2 sprays into both nostrils daily as needed for allergies or rhinitis.     Historical Provider, MD  Fluticasone-Salmeterol (ADVAIR) 250-50 MCG/DOSE AEPB Inhale 1 puff into the lungs 2 (two) times daily. 09/25/14   Noralee Space, MD  furosemide (LASIX) 80 MG tablet Take 1 tablet (80 mg total) by mouth 2 (two) times daily. 03/06/15   Shanker Kristeen Mans, MD  hydrocortisone (ANUSOL-HC) 2.5 % rectal cream Place 1 application rectally 4 (four) times daily. 02/28/15   Amy S Esterwood, PA-C  insulin lispro (HUMALOG KWIKPEN) 100 UNIT/ML KiwkPen Inject 0.01-0.1 mLs (1-10 Units total) into the skin 3 (three) times daily. Sliding scale CBG 70 - 120: 0 units CBG 121 - 150: 1 unit,  CBG 151 - 200: 2 units,  CBG 201 - 250: 3 units,  CBG 251 - 300: 5 units,  CBG 301 - 350: 7 units,  CBG 351 - 400: 9 units   CBG > 400: 10 units and notify your MD 02/02/15   Ripudeep Krystal Eaton, MD  isosorbide-hydrALAZINE (BIDIL) 20-37.5 MG per tablet Take 0.5 tablets by mouth 3 (three) times daily. 03/06/15   Shanker Kristeen Mans, MD  loperamide (IMODIUM) 2 MG capsule Take 1 capsule (2 mg total) by mouth 2 (two) times daily as needed for diarrhea or loose stools. 02/02/15   Ripudeep Krystal Eaton, MD  Multiple Vitamins-Minerals (MULTIVITAMIN WITH MINERALS) tablet Take 1  tablet by mouth daily.    Historical Provider, MD  ondansetron (ZOFRAN) 4 MG tablet Take 1 tablet (4 mg total) by mouth every 8 (eight) hours as needed for nausea or vomiting. 02/28/15   Amy S Esterwood, PA-C  predniSONE (DELTASONE) 20 MG tablet Take 2 tablets (40 mg total) by mouth daily with breakfast. 03/06/15   Jonetta Osgood, MD  promethazine (PHENERGAN) 25 MG tablet Take 1 tablet (25 mg total) by mouth every 4 (four) hours as needed for nausea or vomiting. 02/02/15   Ripudeep Krystal Eaton, MD  saccharomyces boulardii (FLORASTOR) 250 MG capsule Take 1 capsule (250 mg total) by mouth 2 (two) times daily. 02/02/15   Ripudeep Krystal Eaton, MD  sodium bicarbonate 650 MG tablet Take 1 tablet (650 mg total) by mouth 2 (two) times daily. 02/02/15   Ripudeep Krystal Eaton, MD  TOUJEO SOLOSTAR 300 UNIT/ML SOPN Inject 40 Units into the skin every morning. 11/21/14   Historical Provider, MD  triamcinolone cream (KENALOG) 0.1 % Apply 1 application topically 2 (two) times daily.    Historical Provider, MD  warfarin (COUMADIN) 1 MG tablet Take 1.5 tablets (1.5 mg total) by mouth every evening. Take 3 mg today -03/06/15-and then resume 1.5 mg from 8/5 03/06/15   Shanker Kristeen Mans, MD   BP 152/70 mmHg  Pulse 76  Temp(Src) 97.6 F (36.4 C) (Oral)  Resp 20  SpO2 100% Physical Exam  Nursing note and vitals reviewed.  69 year old female, in obvious pain, but in no acute distress. Vital signs are significant for hypertension. Oxygen saturation is 100%, which is normal. Head is normocephalic and atraumatic. PERRLA, EOMI. Oropharynx is clear. Neck is nontender and supple without adenopathy or JVD. Back is nontender and there is no CVA tenderness. Lungs are clear without rales, wheezes, or rhonchi. Chest is nontender. Heart has regular rate and rhythm without murmur. Abdomen is soft, flat, with moderate tenderness diffusely. There is no rebound or guarding. There are no masses or hepatosplenomegaly and peristalsis is hypoactive. Extremities  have no cyanosis or edema, full range of motion is present. Skin is warm and dry without rash. Neurologic: Mental status is normal, cranial nerves are intact, there are no motor or sensory deficits.  ED Course  Procedures (including critical care time) Labs Review Results for orders placed or performed during the hospital encounter of 03/09/15  Lipase, blood  Result Value Ref Range   Lipase 50 22 - 51 U/L  Comprehensive metabolic panel  Result Value Ref Range   Sodium 133 (L) 135 - 145 mmol/L   Potassium 3.7 3.5 - 5.1 mmol/L   Chloride 96 (L) 101 - 111 mmol/L   CO2 22 22 - 32 mmol/L   Glucose, Bld 173 (H) 65 - 99 mg/dL   BUN 76 (H) 6 - 20 mg/dL   Creatinine, Ser 3.90 (H) 0.44 - 1.00 mg/dL   Calcium 9.8 8.9 - 10.3 mg/dL   Total Protein 6.4 (L) 6.5 - 8.1 g/dL   Albumin 2.8 (L) 3.5 - 5.0 g/dL   AST 32 15 - 41 U/L   ALT 19 14 - 54 U/L   Alkaline Phosphatase 148 (H) 38 - 126 U/L   Total Bilirubin 0.8 0.3 - 1.2 mg/dL   GFR calc non Af Amer 11 (L) >60 mL/min   GFR calc Af Amer 13 (L) >60 mL/min   Anion gap 15 5 - 15  CBC  Result Value Ref Range   WBC 12.9 (H) 4.0 - 10.5 K/uL   RBC 4.86 3.87 - 5.11 MIL/uL   Hemoglobin 14.2 12.0 - 15.0 g/dL   HCT 42.0 36.0 - 46.0 %   MCV 86.4 78.0 - 100.0 fL   MCH 29.2 26.0 - 34.0 pg   MCHC 33.8 30.0 - 36.0 g/dL   RDW 16.7 (H) 11.5 - 15.5 %   Platelets 546 (H) 150 - 400 K/uL  Urinalysis, Routine w reflex microscopic (not at [facility])  Result Value Ref Range   Color, Urine YELLOW YELLOW   APPearance CLEAR CLEAR   Specific Gravity, Urine 1.011 1.005 - 1.030   pH 7.0 5.0 - 8.0   Glucose, UA 100 (A) NEGATIVE mg/dL   Hgb urine dipstick NEGATIVE NEGATIVE   Bilirubin Urine NEGATIVE NEGATIVE   Ketones, ur NEGATIVE NEGATIVE mg/dL   Protein, ur >300 (A) NEGATIVE mg/dL   Urobilinogen, UA 0.2 0.0 - 1.0 mg/dL   Nitrite NEGATIVE NEGATIVE   Leukocytes, UA NEGATIVE NEGATIVE  Protime-INR  Result Value Ref Range   Prothrombin Time 21.2 (H) 11.6 - 15.2  seconds   INR 1.84 (H) 0.00 - 1.49  Urine microscopic-add on  Result Value Ref Range   Squamous Epithelial / LPF RARE RARE   WBC, UA 0-2 <3 WBC/hpf  I-Stat CG4 Lactic Acid, ED  Result Value Ref Range   Lactic Acid, Venous 3.59 (HH) 0.5 - 2.0 mmol/L   Comment NOTIFIED PHYSICIAN   I-Stat CG4 Lactic Acid, ED  Result Value Ref Range   Lactic Acid, Venous 0.85 0.5 - 2.0 mmol/L    Imaging Review Ct Abdomen Pelvis Wo Contrast  03/09/2015   CLINICAL DATA:  Patient recently admitted to the hospital for abdominal pain and nausea/vomiting. Returning abdominal pain and cramping.  EXAM: CT ABDOMEN AND PELVIS WITHOUT CONTRAST  TECHNIQUE: Multidetector CT imaging of the abdomen and pelvis was performed following the standard protocol without IV contrast.  COMPARISON:  CT abdomen pelvis 01/20/2015  FINDINGS: Lower chest: Coronary arterial vascular calcifications. Normal heart size. Scarring and or atelectasis within right greater than  left lower lobes. No pleural effusion.  Hepatobiliary: Stable irregular area of low attenuation and calcification within the posterior right hepatic lobe, likely sequelae of remote trauma. No new hepatic lesions identified. Post cholecystectomy changes.  Pancreas: Unremarkable  Spleen: Splenosis.  Surgically absent spleen.  Adrenals/Urinary Tract: Normal adrenal glands. Kidneys are symmetric in size. No nephroureterolithiasis. No hydronephrosis. Urinary bladder is unremarkable.  Stomach/Bowel: Oral contrast material to the rectum. Few scattered colonic diverticuli. No evidence for acute diverticulitis. No evidence for bowel obstruction. No free fluid or free intraperitoneal air.  Vascular/Lymphatic: Normal caliber abdominal aorta. No retroperitoneal adenopathy.  Other: None  Musculoskeletal: No aggressive or acute appearing osseous lesions.  IMPRESSION: No acute process within the abdomen or pelvis.  Status post splenectomy with multiple splenules.  Post cholecystectomy.    Electronically Signed   By: Lovey Newcomer M.D.   On: 03/09/2015 20:21   MDM   Final diagnoses:  Abdominal pain, unspecified abdominal location  Elevated lactic acid level  Chronic renal insufficiency, stage IV (severe)    69 year old female with severe abdominal pain. Review of old records confirm several hospital visits for abdominal pain, stage IV kidney disease, presumptive diagnosis of mesenteric mesenteric ischemia. She did have a CT of her abdomen and pelvis in June, but no vascular studies because of renal insufficiency. She is in significant pain currently and will be given IV fluids and IV narcotics. CT without contrast will be repeated today. I have that noticed that she had several lactic acid levels which were normal or minimally elevated. Lactic acid level will be checked today. Because of ongoing symptoms, consideration may need to be given to getting some angiographic data in spite of risk to kidneys. This will need to be discussed among her various providers and with the patient.  Initial laboratory work is come back with slight elevation of WBC, creatinine slightly lower than at discharge, but BUN increased indicating some degree of dehydration.  Lactic acid level has come back elevated. She is given IV hydration. In spite of renal failure, I feel that hydration can be done safely since patient does appear to be somewhat dehydrated. Pain was reasonably well controlled with hydromorphone although she did require additional injections of. Nausea is controlled with ondansetron. CT is come back unremarkable. Lactic acid level was repeated after hydration and has come back down to normal.  Elevated lactic acid level initially certainly is consistent with mesenteric ischemia. During her hospital stay, decision has to be made how aggressively to pursue this. Specifically, can either angiography or CT angiography be done with her renal insufficiency. She is clearly not tolerating current  management and she strongly desires to know what treatment options she has. Case is discussed with Dr. Arnoldo Morale of [facility] who agrees to admit the patient.  Delora Fuel, MD 20/10/07 1219  Delora Fuel, MD [id number]

## 2015-03-09 NOTE — ED Notes (Signed)
SS, RN attempted IV start x2 no success.

## 2015-03-09 NOTE — ED Notes (Signed)
Pt was recently admitted to hospital for abd pain and n/v/d. Was just dc home a few days ago and now having return of abd pain/cramping and n/v. Denies diarrhea.

## 2015-03-09 NOTE — ED Notes (Signed)
Pt states she can't take anymore (referring to IV) and asked the IV RN to take out the IV and give her time. Dr. [name] Mins informed.

## 2015-03-09 NOTE — H&P (Signed)
[facility] Admission History and Physical       [name]:[id number] DOB: [birth year] DOA: 03/09/2015  Referring physician:  PCP: Antony Blackbird, MD  Specialists:   Chief Complaint: Nausea and Vomiting and ABD Pain  HPI: [name] is a 69 y.o. female with a history of O2 Dependent COPD (at 2 liters), Chronic combined Systolic and Diastolic CHF, Stage IV CKD, Left Ventricular Thrombus on Coumadin Rx and Chronic  Nausea and vomiting and ABD pain who presents to the ED with complaints of increased nausea and vomiting and diffuse ABD pain 7-8/10 cramping in quality and withoutradiation.   She denies any fevers or chills, and reports passing 1 loose stool.   She was found to have an elevated lactic acid level and was administered IVFs and her Lactic Acid went from 3.59 to 0.85.   A repeat C.Diff Antigen was performed on 08/04 and returned positive.   She has placed on Enteric Precautions and referred for admission.       Review of Systems:  Constitutional: No Weight Loss, No Weight Gain, Night Sweats, Fevers, Chills, Dizziness, Light Headedness, Fatigue, or Generalized Weakness HEENT: No Headaches, Difficulty Swallowing,Tooth/Dental Problems,Sore Throat,  No Sneezing, Rhinitis, Ear Ache, Nasal Congestion, or Post Nasal Drip,  Cardio-vascular:  No Chest pain, Orthopnea, PND, Edema in Lower Extremities, Anasarca, Dizziness, Palpitations  Resp: +Dyspnea, No DOE, No Productive Cough, No Non-Productive Cough, No Hemoptysis, No Wheezing.    GI: No Heartburn, Indigestion, +Abdominal Pain, Nausea, Vomiting, Diarrhea, Constipation, Hematemesis, Hematochezia, Melena, Change in Bowel Habits,  Loss of Appetite  GU: No Dysuria, No Change in Color of Urine, No Urgency or Urinary Frequency, No Flank pain.  Musculoskeletal: No Joint Pain or Swelling, No Decreased Range of Motion, No Back Pain.  Neurologic: No Syncope, No Seizures, Muscle Weakness, Paresthesia, Vision Disturbance or  Loss, No Diplopia, No Vertigo, No Difficulty Walking,  Skin: No Rash or Lesions. Psych: No Change in Mood or Affect, No Depression or Anxiety, No Memory loss, No Confusion, or Hallucinations   Past Medical History  Diagnosis Date  . Essential hypertension, benign   . Coronary atherosclerosis of native coronary artery 01/04/2006    Tiny OM1 70-90% ostial stenosis, no other CAD  . COPD (chronic obstructive pulmonary disease)   . Esophageal reflux   . Dyslipidemia   . Gout   . Colon polyps 2011    Tubular adenomatous polyps  . Spinal stenosis of lumbar region     chronic low back pain.   . Bulging lumbar disc   . Chronic diastolic heart failure   . PNA (pneumonia) 2012    With pleural effusion, requiring thoracentesis  . Chronic bronchitis   . On home oxygen therapy   . OSA (obstructive sleep apnea)     Marginally compliant with CPAP  . Type II diabetes mellitus   . Arthritis   . Diverticulosis   . GERD (gastroesophageal reflux disease)   . Anxiety   . Hyperlipidemia   . Sarcoidosis of lung   . CKD (chronic kidney disease) stage 4, GFR 15-29 ml/min   . IBS (irritable bowel syndrome)   . Cataracts, both eyes   . Headache(784.0)   . Lactic acidosis 01/21/2015  . Chronic CHF (congestive heart failure)      Past Surgical History  Procedure Laterality Date  . Vesicovaginal fistula closure w/  total abdominal hysterectomy  1992  . Bilateral total knee replacements Bilateral Rt=5/04 & Lft=1/09    by DrAlusio  .  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 Zella Richer  . Appendectomy  1957  . Tonsillectomy  1968  . Cholecystectomy  1980's  . Abdominal hysterectomy  1992  . Reduction mammaplasty Bilateral 1980's  . Cardiac catheterization  1990's  . Thoracentesis  2012  . Joint replacement    . Av fistula placement Left 12/31/2013    Procedure: ARTERIOVENOUS (AV) FISTULA CREATION;  Surgeon: Elam Dutch, MD;  Location: [place];  Service: Vascular;  Laterality: Left;  . Colonoscopy w/  biopsies and polypectomy    . Av fistula placement Left 03/18/2014    Procedure: ARTERIOVENOUS (AV) FISTULA CREATION- LEFT BRACHIOCEPHALIC ;  Surgeon: Elam Dutch, MD;  Location: [facility] OR;  Service: Vascular;  Laterality: Left;  . Ligation of arteriovenous  fistula Left 03/18/2014    Procedure: LIGATION OF ARTERIOVENOUS  FISTULA- LEFT RADIOCEPHALIC;  Surgeon: Elam Dutch, MD;  Location: [place];  Service: Vascular;  Laterality: Left;  . Cardiac catheterization N/A 01/27/2015    Procedure: Right Heart Cath;  Surgeon: Larey Dresser, MD;  Location: [facility];  Service: Cardiovascular;  Laterality: N/A;      Prior to Admission medications   Medication Sig Start Date End Date Taking? Authorizing Provider  acetaminophen (TYLENOL) 500 MG tablet Take 1,000 mg by mouth 2 (two) times daily as needed (pain).    Yes Historical Provider, MD  albuterol (PROVENTIL HFA;VENTOLIN HFA) 108 (90 BASE) MCG/ACT inhaler Inhale 2 puffs into the lungs every 4 (four) hours as needed for wheezing or shortness of breath.    Yes Historical Provider, MD  albuterol (PROVENTIL) (2.5 MG/3ML) 0.083% nebulizer solution Take 2.5 mg by nebulization every 2 (two) hours as needed for wheezing or shortness of breath.   Yes Historical Provider, MD  Alpha-D-Galactosidase (BEANO PO) Take 1 tablet by mouth daily.   Yes Historical Provider, MD  Apremilast (OTEZLA) 30 MG TABS Take 30 mg by mouth daily.    Yes Historical Provider, MD  atorvastatin (LIPITOR) 20 MG tablet Take 20 mg by mouth at bedtime.   Yes Historical Provider, MD  carvedilol (COREG) 12.5 MG tablet Take 0.5 tablets (6.25 mg total) by mouth 2 (two) times daily with a meal. 03/06/15  Yes Shanker Kristeen Mans, MD  cephALEXin (KEFLEX) 250 MG capsule Take 1 capsule (250 mg total) by mouth 2 (two) times daily. 03/06/15  Yes Shanker Kristeen Mans, MD  dicyclomine (BENTYL) 10 MG capsule Take 1 tab twice daily. Patient taking differently: Take 10 mg by mouth 2 (two) times daily.  Take 1 tab twice daily. 02/28/15  Yes Amy S Esterwood, PA-C  esomeprazole (NEXIUM) 40 MG capsule Take 1 capsule by mouth 30 min before breakfast. 03/03/15  Yes Amy S Esterwood, PA-C  famotidine (PEPCID) 20 MG tablet Take 1 tablet (20 mg total) by mouth daily. 02/02/15  Yes Ripudeep Krystal Eaton, MD  feeding supplement (BOOST / RESOURCE BREEZE) LIQD Take 1 Container by mouth 3 (three) times daily between meals. 03/06/15  Yes Shanker Kristeen Mans, MD  ferrous sulfate 325 (65 FE) MG tablet Take 325 mg by mouth daily with breakfast.   Yes Historical Provider, MD  fluticasone (FLONASE) 50 MCG/ACT nasal spray Place 2 sprays into both nostrils daily as needed for allergies or rhinitis.    Yes Historical Provider, MD  Fluticasone-Salmeterol (ADVAIR) 250-50 MCG/DOSE AEPB Inhale 1 puff into the lungs 2 (two) times daily. 09/25/14  Yes Noralee Space, MD  furosemide (LASIX) 80 MG tablet Take 1  tablet (80 mg total) by mouth 2 (two) times daily. 03/06/15  Yes Shanker Kristeen Mans, MD  hydrocortisone (ANUSOL-HC) 2.5 % rectal cream Place 1 application rectally 4 (four) times daily. 02/28/15  Yes Amy S Esterwood, PA-C  insulin lispro (HUMALOG KWIKPEN) 100 UNIT/ML KiwkPen Inject 0.01-0.1 mLs (1-10 Units total) into the skin 3 (three) times daily. Sliding scale CBG 70 - 120: 0 units CBG 121 - 150: 1 unit,  CBG 151 - 200: 2 units,  CBG 201 - 250: 3 units,  CBG 251 - 300: 5 units,  CBG 301 - 350: 7 units,  CBG 351 - 400: 9 units   CBG > 400: 10 units and notify your MD 02/02/15  Yes Ripudeep Krystal Eaton, MD  isosorbide mononitrate (IMDUR) 30 MG 24 hr tablet Take 15 mg by mouth daily. 02/20/15  Yes Historical Provider, MD  loperamide (IMODIUM) 2 MG capsule Take 1 capsule (2 mg total) by mouth 2 (two) times daily as needed for diarrhea or loose stools. 02/02/15  Yes Ripudeep Krystal Eaton, MD  Multiple Vitamins-Minerals (MULTIVITAMIN WITH MINERALS) tablet Take 1 tablet by mouth daily.   Yes Historical Provider, MD  ondansetron (ZOFRAN) 4 MG tablet Take 1 tablet (4 mg  total) by mouth every 8 (eight) hours as needed for nausea or vomiting. 02/28/15  Yes Amy S Esterwood, PA-C  predniSONE (DELTASONE) 20 MG tablet Take 2 tablets (40 mg total) by mouth daily with breakfast. 03/06/15  Yes Shanker Kristeen Mans, MD  promethazine (PHENERGAN) 25 MG tablet Take 1 tablet (25 mg total) by mouth every 4 (four) hours as needed for nausea or vomiting. 02/02/15  Yes Ripudeep Krystal Eaton, MD  saccharomyces boulardii (FLORASTOR) 250 MG capsule Take 1 capsule (250 mg total) by mouth 2 (two) times daily. 02/02/15  Yes Ripudeep Krystal Eaton, MD  sodium bicarbonate 650 MG tablet Take 1 tablet (650 mg total) by mouth 2 (two) times daily. 02/02/15  Yes Ripudeep Krystal Eaton, MD  TOUJEO SOLOSTAR 300 UNIT/ML SOPN Inject 40 Units into the skin every morning. 11/21/14  Yes Historical Provider, MD  triamcinolone cream (KENALOG) 0.1 % Apply 1 application topically 2 (two) times daily.   Yes Historical Provider, MD  warfarin (COUMADIN) 1 MG tablet Take 1.5 tablets (1.5 mg total) by mouth every evening. Take 3 mg today -03/06/15-and then resume 1.5 mg from 8/5 03/06/15  Yes Shanker Kristeen Mans, MD  isosorbide-hydrALAZINE (BIDIL) 20-37.5 MG per tablet Take 0.5 tablets by mouth 3 (three) times daily. 03/06/15   Shanker Kristeen Mans, MD     Allergies  Allergen Reactions  . Adhesive [Tape] Other (See Comments)    Blisters   . Avelox [Moxifloxacin Hcl In Nacl] Other (See Comments)    GI upset  . Codeine Other (See Comments)    "crazy"   . Guaifenesin Nausea And Vomiting    Takes Mucinex at home without issue  . Latex Swelling  . Oxycodone Nausea And Vomiting    Takes Percocet at home without issue    Social History:  reports that she has never smoked. She has never used smokeless tobacco. She reports that she does not drink alcohol or use illicit drugs.    Family History  Problem Relation Age of Onset  . Diabetes Mother   . Heart disease Mother   . Hyperlipidemia Mother   . Varicose Veins Mother   . Breast cancer Maternal  Aunt   . Heart disease Father   . Deep vein thrombosis Father   .  Hyperlipidemia Father   . Heart disease Sister     before age 47  . Cancer Sister   . Diabetes Sister   . Hyperlipidemia Sister   . Heart attack Sister   . Heart disease Brother   . Diabetes Brother   . Hyperlipidemia Brother   . Heart attack Brother   . Hyperlipidemia Sister        Physical Exam:  GEN:  Pleasant Obese Elderly  69 y.o. African American female examined and in no acute distress; cooperative with exam Filed Vitals:   03/09/15 2030 03/09/15 2115 03/09/15 2230 03/09/15 2315  BP: 136/72 115/97 151/114 106/58  Pulse:  96 100 87  Temp:      TempSrc:      Resp:      SpO2:  98% 99% 96%   Blood pressure 106/58, pulse 87, temperature 97.6 F (36.4 C), temperature source Oral, resp. rate 20, SpO2 96 %. PSYCH: She is alert and oriented x4; does not appear anxious does not appear depressed; affect is normal HEENT: Normocephalic and Atraumatic, Mucous membranes pink; PERRLA; EOM intact; Fundi:  Benign;  No scleral icterus, Nares: Patent, Oropharynx: Clear, Edentulous with Dentures,    Neck:  FROM, No Cervical Lymphadenopathy nor Thyromegaly or Carotid Bruit; No JVD; Breasts:: Not examined CHEST WALL: No tenderness CHEST: Normal respiration, clear to auscultation bilaterally HEART: Regular rate and rhythm; no murmurs rubs or gallops BACK: No kyphosis or scoliosis; No CVA tenderness ABDOMEN: Positive Bowel Sounds, Obese, Soft Non-Tender, No Rebound or Guarding; No Masses, No Organomegaly, No Pannus; No Intertriginous candida. Rectal Exam: Not done EXTREMITIES:  Hyperpigmented areas of Distal BLEs,  No Cyanosis, Clubbing, or Edema; No Ulcerations. Genitalia: not examined PULSES: 2+ and symmetric SKIN: Normal hydration no rash or ulceration CNS:  Alert and Oriented x 4, No Focal Deficits Vascular: pulses palpable throughout    Labs on Admission:  Basic Metabolic Panel:  Recent Labs Lab 03/03/15 0307  03/04/15 0248 03/05/15 0620 03/06/15 0601 03/09/15 1540  NA 133* 131* 137 134* 133*  K 5.1 5.6* 4.6 4.0 3.7  CL 107 105 106 99* 96*  CO2 19* 18* 22 23 22   GLUCOSE 78 72 89 165* 173*  BUN 52* 53* 59* 66* 76*  CREATININE 4.73* 4.91* 5.08* 4.67* 3.90*  CALCIUM 8.9 8.9 9.3 9.2 9.8  MG 2.2  --   --   --   --    Liver Function Tests:  Recent Labs Lab 03/09/15 1540  AST 32  ALT 19  ALKPHOS 148*  BILITOT 0.8  PROT 6.4*  ALBUMIN 2.8*    Recent Labs Lab 03/09/15 1540  LIPASE 50   No results for input(s): AMMONIA in the last 168 hours. CBC:  Recent Labs Lab 03/05/15 0620 03/09/15 1540  WBC 9.8 12.9*  HGB 12.4 14.2  HCT 37.4 42.0  MCV 88.8 86.4  PLT 534* 546*   Cardiac Enzymes: No results for input(s): CKTOTAL, CKMB, CKMBINDEX, TROPONINI in the last 168 hours.  BNP (last 3 results)  Recent Labs  01/21/15 1830 02/02/15 0517 02/24/15 1612  BNP 1056.8* 2924.8* 475.5*    ProBNP (last 3 results)  Recent Labs  04/26/14 2358 06/24/14 0945  PROBNP 3725.0* 324.0*    CBG:  Recent Labs Lab 03/05/15 1726 03/05/15 2158 03/06/15 0742 03/06/15 1138 03/06/15 1641  GLUCAP 376* 271* 162* 345* 297*    Radiological Exams on Admission: Ct Abdomen Pelvis Wo Contrast  03/09/2015   CLINICAL DATA:  Patient recently admitted to the  hospital for abdominal pain and nausea/vomiting. Returning abdominal pain and cramping.  EXAM: CT ABDOMEN AND PELVIS WITHOUT CONTRAST  TECHNIQUE: Multidetector CT imaging of the abdomen and pelvis was performed following the standard protocol without IV contrast.  COMPARISON:  CT abdomen pelvis 01/20/2015  FINDINGS: Lower chest: Coronary arterial vascular calcifications. Normal heart size. Scarring and or atelectasis within right greater than left lower lobes. No pleural effusion.  Hepatobiliary: Stable irregular area of low attenuation and calcification within the posterior right hepatic lobe, likely sequelae of remote trauma. No new hepatic  lesions identified. Post cholecystectomy changes.  Pancreas: Unremarkable  Spleen: Splenosis.  Surgically absent spleen.  Adrenals/Urinary Tract: Normal adrenal glands. Kidneys are symmetric in size. No nephroureterolithiasis. No hydronephrosis. Urinary bladder is unremarkable.  Stomach/Bowel: Oral contrast material to the rectum. Few scattered colonic diverticuli. No evidence for acute diverticulitis. No evidence for bowel obstruction. No free fluid or free intraperitoneal air.  Vascular/Lymphatic: Normal caliber abdominal aorta. No retroperitoneal adenopathy.  Other: None  Musculoskeletal: No aggressive or acute appearing osseous lesions.  IMPRESSION: No acute process within the abdomen or pelvis.  Status post splenectomy with multiple splenules.  Post cholecystectomy.   Electronically Signed   By: Lovey Newcomer M.D.   On: 03/09/2015 20:21       Assessment/Plan:   69 y.o. female with  Principal Problem:  1.    Nausea and vomiting, diarrhea   Enteric Precautions    Repeat Check of C.Diff PCR   Anti-Emetics PRN    IV Protonix   Continue Florastor Rx   Continue Bentyl  Rx    Active Problems:  2.    ABDOMINAL PAIN -GENERALIZED-  Acute due to #1 and Chronic of Unknown Etiology   PRN IV Dilaudid    3.    Chronic combined systolic and diastolic CHF- No signs of Decompensation    Last 2-D ECHO on 01/21/2015 EF 30-35%   Monitor for signs of Fluid Overload   Continue Lasix, Carvedilol, and Imdur/Hydralazine Rx    4.    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   Thrombocytosis  Chronic, due to Splenectomy    6.    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   COPD   DuoNebs    Continue Home O2   Monitor O2 sats   8.    Hypertention    Continue Carvedilol, Imdur/Hydralazine and Lasix   Monitor BPs   9.   Left mural  thrombus   Continue Coumadin     Pharmacy Adjustment   10. DVT Prophylaxis   On Coumadin     Code Status:     FULL CODE       Family Communication:   No Family Present    Disposition Plan:    Inpatient Status        Time spent: [facility] Pager [phone]   If [place] Please Contact the Day Rounding Team MD for [facility]  If 7PM-7AM, Please Contact Night-Floor Coverage  [email or web address] Password [email or web address] 03/09/2015, 11:23 PM     ADDENDUM:   Patient was seen and examined on 03/09/2015

## 2015-03-09 NOTE — ED Notes (Signed)
IV team at bedside 

## 2015-03-09 NOTE — ED Notes (Signed)
IV team called and informed this RN PICC line insertion would need to be approved by renal and wouldn't be able to be placed until tomorrow morning if approved.

## 2015-03-10 ENCOUNTER — Inpatient Hospital Stay (HOSPITAL_COMMUNITY): Payer: Medicare Other

## 2015-03-10 DIAGNOSIS — R1033 Periumbilical pain: Secondary | ICD-10-CM

## 2015-03-10 DIAGNOSIS — R109 Unspecified abdominal pain: Secondary | ICD-10-CM | POA: Insufficient documentation

## 2015-03-10 DIAGNOSIS — N184 Chronic kidney disease, stage 4 (severe): Secondary | ICD-10-CM | POA: Diagnosis present

## 2015-03-10 DIAGNOSIS — D75839 Thrombocytosis, unspecified: Secondary | ICD-10-CM | POA: Diagnosis present

## 2015-03-10 DIAGNOSIS — R197 Diarrhea, unspecified: Secondary | ICD-10-CM

## 2015-03-10 DIAGNOSIS — E860 Dehydration: Secondary | ICD-10-CM

## 2015-03-10 DIAGNOSIS — R7989 Other specified abnormal findings of blood chemistry: Secondary | ICD-10-CM | POA: Insufficient documentation

## 2015-03-10 DIAGNOSIS — G43A0 Cyclical vomiting, not intractable: Secondary | ICD-10-CM

## 2015-03-10 DIAGNOSIS — J449 Chronic obstructive pulmonary disease, unspecified: Secondary | ICD-10-CM | POA: Diagnosis present

## 2015-03-10 DIAGNOSIS — E1122 Type 2 diabetes mellitus with diabetic chronic kidney disease: Secondary | ICD-10-CM | POA: Diagnosis present

## 2015-03-10 DIAGNOSIS — D473 Essential (hemorrhagic) thrombocythemia: Secondary | ICD-10-CM | POA: Diagnosis present

## 2015-03-10 LAB — GLUCOSE, CAPILLARY
GLUCOSE-CAPILLARY: 133 mg/dL — AB (ref 65–99)
Glucose-Capillary: 144 mg/dL — ABNORMAL HIGH (ref 65–99)
Glucose-Capillary: 136 mg/dL — ABNORMAL HIGH (ref 65–99)
Glucose-Capillary: 183 mg/dL — ABNORMAL HIGH (ref 65–99)
Glucose-Capillary: 156 mg/dL — ABNORMAL HIGH (ref 65–99)
Glucose-Capillary: 124 mg/dL — ABNORMAL HIGH (ref 65–99)

## 2015-03-10 LAB — C DIFFICILE QUICK SCREEN W PCR REFLEX
C Diff antigen: NEGATIVE
C Diff interpretation: NEGATIVE
C Diff toxin: NEGATIVE

## 2015-03-10 LAB — BASIC METABOLIC PANEL
ANION GAP: 14 (ref 5–15)
BUN: 72 mg/dL — ABNORMAL HIGH (ref 6–20)
CO2: 21 mmol/L — ABNORMAL LOW (ref 22–32)
Calcium: 9.2 mg/dL (ref 8.9–10.3)
Chloride: 97 mmol/L — ABNORMAL LOW (ref 101–111)
Creatinine, Ser: 4.16 mg/dL — ABNORMAL HIGH (ref 0.44–1.00)
GFR calc Af Amer: 12 mL/min — ABNORMAL LOW (ref >60)
GFR calc non Af Amer: 10 mL/min — ABNORMAL LOW (ref >60)
Glucose, Bld: 129 mg/dL — ABNORMAL HIGH (ref 65–99)
POTASSIUM: 3.5 mmol/L (ref 3.5–5.1)
Sodium: 132 mmol/L — ABNORMAL LOW (ref 135–145)

## 2015-03-10 LAB — DIFFERENTIAL
Basophils Absolute: 0 10*3/uL (ref 0.0–0.1)
Basophils Relative: 0 % (ref 0–1)
EOS PCT: 1 % (ref 0–5)
Eosinophils Absolute: 0.1 10*3/uL (ref 0.0–0.7)
LYMPHS ABS: 4.7 10*3/uL — AB (ref 0.7–4.0)
Lymphocytes Relative: 35 % (ref 12–46)
MONOS PCT: 15 % — AB (ref 3–12)
Monocytes Absolute: 2 10*3/uL — ABNORMAL HIGH (ref 0.1–1.0)
NEUTROS PCT: 50 % (ref 43–77)
Neutro Abs: 6.8 10*3/uL (ref 1.7–7.7)

## 2015-03-10 LAB — CBC
HCT: 38.6 % (ref 36.0–46.0)
Hemoglobin: 12.7 g/dL (ref 12.0–15.0)
MCH: 28.9 pg (ref 26.0–34.0)
MCHC: 32.9 g/dL (ref 30.0–36.0)
MCV: 87.7 fL (ref 78.0–100.0)
Platelets: 490 10*3/uL — ABNORMAL HIGH (ref 150–400)
RBC: 4.4 MIL/uL (ref 3.87–5.11)
RDW: 16.8 % — AB (ref 11.5–15.5)
WBC: 12.1 10*3/uL — ABNORMAL HIGH (ref 4.0–10.5)

## 2015-03-10 MED ORDER — WARFARIN SODIUM 1 MG PO TABS
1.5000 mg | ORAL_TABLET | Freq: Every evening | ORAL | Status: DC
  Administered 2015-03-10: 1.5 mg via ORAL
  Filled 2015-03-10 (×2): qty 1

## 2015-03-10 MED ORDER — SACCHAROMYCES BOULARDII 250 MG PO CAPS
250.0000 mg | ORAL_CAPSULE | Freq: Two times a day (BID) | ORAL | Status: DC
  Administered 2015-03-10 – 2015-03-16 (×13): 250 mg via ORAL
  Filled 2015-03-10 (×13): qty 1

## 2015-03-10 MED ORDER — FERROUS SULFATE 325 (65 FE) MG PO TABS
325.0000 mg | ORAL_TABLET | Freq: Every day | ORAL | Status: DC
  Administered 2015-03-10 – 2015-03-12 (×3): 325 mg via ORAL
  Filled 2015-03-10 (×3): qty 1

## 2015-03-10 MED ORDER — HYDROMORPHONE HCL 1 MG/ML IJ SOLN
0.5000 mg | INTRAMUSCULAR | Status: DC | PRN
  Administered 2015-03-10 – 2015-03-12 (×3): 0.5 mg via INTRAVENOUS
  Administered 2015-03-13 – 2015-03-15 (×5): 1 mg via INTRAVENOUS
  Filled 2015-03-10 (×8): qty 1

## 2015-03-10 MED ORDER — INSULIN ASPART 100 UNIT/ML ~~LOC~~ SOLN
0.0000 [IU] | Freq: Three times a day (TID) | SUBCUTANEOUS | Status: DC
  Administered 2015-03-10: 2 [IU] via SUBCUTANEOUS
  Administered 2015-03-10: 1 [IU] via SUBCUTANEOUS
  Administered 2015-03-11: 2 [IU] via SUBCUTANEOUS
  Administered 2015-03-12 – 2015-03-13 (×3): 1 [IU] via SUBCUTANEOUS
  Administered 2015-03-14 – 2015-03-15 (×2): 2 [IU] via SUBCUTANEOUS
  Administered 2015-03-16: 1 [IU] via SUBCUTANEOUS

## 2015-03-10 MED ORDER — INSULIN GLARGINE 100 UNIT/ML ~~LOC~~ SOLN
20.0000 [IU] | Freq: Every day | SUBCUTANEOUS | Status: DC
  Administered 2015-03-10 – 2015-03-16 (×7): 20 [IU] via SUBCUTANEOUS
  Filled 2015-03-10 (×8): qty 0.2

## 2015-03-10 MED ORDER — ADULT MULTIVITAMIN W/MINERALS CH
1.0000 | ORAL_TABLET | Freq: Every day | ORAL | Status: DC
  Administered 2015-03-10 – 2015-03-16 (×6): 1 via ORAL
  Filled 2015-03-10 (×12): qty 1

## 2015-03-10 MED ORDER — BOOST / RESOURCE BREEZE PO LIQD: 1.0000 | Freq: Three times a day (TID) | ORAL | Status: DC

## 2015-03-10 MED ORDER — INSULIN GLARGINE 300 UNIT/ML ~~LOC~~ SOPN: 20.0000 [IU] | PEN_INJECTOR | Freq: Every morning | SUBCUTANEOUS | Status: DC

## 2015-03-10 MED ORDER — ATORVASTATIN CALCIUM 20 MG PO TABS
20.0000 mg | ORAL_TABLET | Freq: Every day | ORAL | Status: DC
  Administered 2015-03-10 – 2015-03-15 (×7): 20 mg via ORAL
  Filled 2015-03-10 (×7): qty 1

## 2015-03-10 MED ORDER — ACETAMINOPHEN 650 MG RE SUPP: 650.0000 mg | Freq: Four times a day (QID) | RECTAL | Status: DC | PRN

## 2015-03-10 MED ORDER — SODIUM CHLORIDE 0.9 % IV SOLN
INTRAVENOUS | Status: DC
Start: 2015-03-10 — End: 2015-03-10

## 2015-03-10 MED ORDER — INSULIN ASPART 100 UNIT/ML ~~LOC~~ SOLN: 0.0000 [IU] | Freq: Every day | SUBCUTANEOUS | Status: DC

## 2015-03-10 MED ORDER — WARFARIN SODIUM 3 MG PO TABS
3.0000 mg | ORAL_TABLET | Freq: Once | ORAL | Status: AC
  Administered 2015-03-10: 3 mg via ORAL
  Filled 2015-03-10: qty 1

## 2015-03-10 MED ORDER — WARFARIN - PHYSICIAN DOSING INPATIENT: Freq: Every day | Status: DC

## 2015-03-10 MED ORDER — SODIUM CHLORIDE 0.9 % IV SOLN
INTRAVENOUS | Status: AC
  Administered 2015-03-10: 18:00:00 via INTRAVENOUS
  Filled 2015-03-10: qty 1000

## 2015-03-10 MED ORDER — HYDROCORTISONE 2.5 % RE CREA
1.0000 "application " | TOPICAL_CREAM | Freq: Four times a day (QID) | RECTAL | Status: DC
  Administered 2015-03-10: 1 via RECTAL
  Filled 2015-03-10: qty 28.35

## 2015-03-10 MED ORDER — ACETAMINOPHEN 325 MG PO TABS
650.0000 mg | ORAL_TABLET | Freq: Four times a day (QID) | ORAL | Status: DC | PRN
Start: 2015-03-10 — End: 2015-03-16
  Administered 2015-03-10 – 2015-03-12 (×3): 650 mg via ORAL
  Filled 2015-03-10 (×2): qty 2

## 2015-03-10 MED ORDER — ONDANSETRON HCL 4 MG/2ML IJ SOLN
4.0000 mg | Freq: Four times a day (QID) | INTRAMUSCULAR | Status: DC | PRN
  Administered 2015-03-12 – 2015-03-14 (×3): 4 mg via INTRAVENOUS
  Filled 2015-03-10 (×2): qty 2

## 2015-03-10 MED ORDER — DICYCLOMINE HCL 10 MG PO CAPS
10.0000 mg | ORAL_CAPSULE | Freq: Two times a day (BID) | ORAL | Status: DC
  Administered 2015-03-10 – 2015-03-13 (×8): 10 mg via ORAL
  Filled 2015-03-10 (×9): qty 1

## 2015-03-10 MED ORDER — FUROSEMIDE 80 MG PO TABS
80.0000 mg | ORAL_TABLET | Freq: Two times a day (BID) | ORAL | Status: DC
  Filled 2015-03-10: qty 1

## 2015-03-10 MED ORDER — SODIUM BICARBONATE 650 MG PO TABS
650.0000 mg | ORAL_TABLET | Freq: Two times a day (BID) | ORAL | Status: DC
  Administered 2015-03-10 – 2015-03-16 (×13): 650 mg via ORAL
  Filled 2015-03-10 (×12): qty 1

## 2015-03-10 MED ORDER — WARFARIN - PHARMACIST DOSING INPATIENT: Freq: Every day | Status: DC

## 2015-03-10 MED ORDER — ISOSORBIDE MONONITRATE ER 30 MG PO TB24: 15.0000 mg | ORAL_TABLET | Freq: Every day | ORAL | Status: DC

## 2015-03-10 MED ORDER — CARVEDILOL 6.25 MG PO TABS
6.2500 mg | ORAL_TABLET | Freq: Two times a day (BID) | ORAL | Status: DC
  Administered 2015-03-10 – 2015-03-16 (×11): 6.25 mg via ORAL
  Filled 2015-03-10 (×11): qty 1

## 2015-03-10 MED ORDER — ALUM & MAG HYDROXIDE-SIMETH 200-200-20 MG/5ML PO SUSP
30.0000 mL | Freq: Four times a day (QID) | ORAL | Status: DC | PRN
  Administered 2015-03-15 – 2015-03-16 (×2): 30 mL via ORAL
  Filled 2015-03-10 (×2): qty 30

## 2015-03-10 MED ORDER — ONDANSETRON HCL 4 MG PO TABS: 4.0000 mg | ORAL_TABLET | Freq: Four times a day (QID) | ORAL | Status: DC | PRN

## 2015-03-10 MED ORDER — PREDNISONE 20 MG PO TABS
40.0000 mg | ORAL_TABLET | Freq: Every day | ORAL | Status: DC
  Filled 2015-03-10: qty 2

## 2015-03-10 NOTE — Procedures (Signed)
Central Venous Catheter Insertion Procedure Note SHYLYN YOUNCE [id number] 1945-09-23  Procedure: Insertion of Central Venous Catheter Indications: Drug and/or fluid administration and Frequent blood sampling  Procedure Details Consent: Risks of procedure as well as the alternatives and risks of each were explained to the (patient/caregiver).  Consent for procedure obtained. Time Out: Verified patient identification, verified procedure, site/side was marked, verified correct patient position, special equipment/implants available, medications/allergies/relevent history reviewed, required imaging and test results available.  Performed  Maximum sterile technique was used including antiseptics, cap, gloves, gown, hand hygiene, mask and sheet. Skin prep: Chlorhexidine; local anesthetic administered A triple lumen catheter was placed in the right internal jugular vein using the Seldinger technique to 16 cm, sutured.  Evaluation Blood flow good Complications: No apparent complications Patient did tolerate procedure well. Chest X-ray ordered to verify placement.  CXR: pending.  Procedure performed under direct supervision of Dr. Nelda Marseille and with ultrasound guidance for real time vessel cannulation.       Noe Gens, NP-C [facility] Pgr: [phone] or if no answer [phone] 03/10/2015, 12:26 PM  I was present and supervised the entire procedure.  Rush Farmer, M.D. [facility] Pulmonary/Critical Care Medicine. Pager: [phone]. After hours pager: [phone].

## 2015-03-10 NOTE — Progress Notes (Signed)
Initial Nutrition Assessment  DOCUMENTATION CODES:   Obesity unspecified  INTERVENTION:   Continue Boost Breeze po TID, each supplement provides 250 kcal and 9 grams of protein.  Encourage adequate PO intake.  NUTRITION DIAGNOSIS:   Inadequate oral intake related to nausea, vomiting as evidenced by meal completion < 25%.  GOAL:   Patient will meet greater than or equal to 90% of their needs  MONITOR:   PO intake, Supplement acceptance, Diet advancement, Weight trends, Labs, I & O's  REASON FOR ASSESSMENT:   Malnutrition Screening Tool    ASSESSMENT:   69 y.o. female with a history of O2 Dependent COPD (at 2 liters), Chronic combined Systolic and Diastolic CHF, Stage IV CKD, Left Ventricular Thrombus on Coumadin Rx and Chronic Nausea and vomiting and ABD pain who presents to the ED with complaints of increased nausea and vomiting and diffuse ABD pain 7-8/10 cramping in quality and without radiation.  Meal completion has been 25%. Pt reports her abdominal pain started Saturday night and since then she has not been able to keep anything down. Pt with a 2.3% weight loss in 4 days. Pt currently has Boost Breeze ordered. RD to continue with current orders. Pt with no observed significant fat or muscle mass loss.   Labs: Low sodium, chloride, CO2, GFR. High BUN and creatinine.   Diet Order:  Diet clear liquid Room service appropriate?: Yes; Fluid consistency:: Thin  Skin:  Reviewed, no issues  Last BM:  PTA  Height:   Ht Readings from Last 1 Encounters:  03/02/15 5\' 2"  (1.575 m)    Weight:   Wt Readings from Last 1 Encounters:  03/10/15 169 lb 5 oz (76.8 kg)   Ideal Body Weight:  50 kg  BMI:  Body mass index is 30.96 kg/(m^2).  Estimated Nutritional Needs:   Kcal:  1800-2000  Protein:  80-95 grams  Fluid:  1.8 - 2 L/day  EDUCATION NEEDS:   No education needs identified at this time  Corrin Parker, MS, RD, LDN Pager # [phone] After hours/ weekend  pager # [phone]

## 2015-03-10 NOTE — Clinical Documentation Improvement (Signed)
  After study, please clarify respiratory status in progress notes and discharge summary  Possible Clinical Conditions?  Acute on Chronic Respiratory Failure Chronic Respiratory Failure Other Condition Cannot Clinically Determine   Supporting Information:  Per H&P: history of O2 Dependent COPD (at 2 liters),  Resp: +Dyspnea, No DOE, No Productive Cough, No Non-Productive Cough, No Hemoptysis, No Wheezing.  Treatments: DuoNebs  Continue Home O2 Monitor O2 sats with VS  Thank You,Annalyce Lanpher T. Pricilla Handler, MSN, MBA/MHA Clinical Documentation Specialist [email or web address] Office # [phone]

## 2015-03-10 NOTE — Consult Note (Signed)
[facility] Consult: 9:55 AM 03/10/2015  LOS: 1 day    Referring Provider: Dr Tat  Primary Care Physician:  Antony Blackbird, MD Primary Gastroenterologist:  Dr. Henrene Pastor.     Reason for Consultation:  Recurrent n/v/diarrhea, abdominal pain.    HPI: [name] is a 69 y.o. female.  Hx DM2 on insulin, cardiomyopathy/CHF with EF 30 to 35%. NSTEMI 01/2015.  Stage 4 CKD wich prevents contrasted radiologic studies.  She is nearing end-stage renal disease. 2 separate AV fistulas in the left arm have failed, she is due to undergo right arm AV graft in the near future. OHS/OSA, COPD, sarcoidosis,  on prn O2 at home. .  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 S/p cholecystectomy and splenectomy.   Issues with frequent episodes of n/v, cramping abd pain, loose stools since at least 11/2014.  On average 3 to 5 BMs per day.  Some rectal bleeding but generally not having bloody stools.  Chronic alk phos elevation (304 max on 01/20/15) but t bil and AST/ALT normal  12/2009 Colonoscopy: for abdominal pain and change in bowel habits.4 polyps removed (ascending, transverse, rectum; tubular adenoma and mucal prolapse polyp on path). Ascending AVM. Moderate sigmoid tics.  12/2009 EGD: Dysphagia, abdominal pain. Normal. CT abdomen pelvis without contrast 12/16/14: Nothing acute, stable 1.3 cm low attenuation lesion right liver lobe. Subtle liver nodularity on left, suggestive of cirrhosis.  CT abdomen pelvis without contrast. 12/21/14: nothing acute. Scattered colon tics, scattered calcifications of coronaries, abdominal aorta and branches, stable low attenuation liver lesion. Chronic, anterior, soft tissue inflammation of abdominal wall.  CT abdomen pelvis without contrast 01/20/15: Atherosclerosis, including left  anterior descending coronary artery disease. Status post splenectomy. Multiple splenules. Moderate atherosclerosis throughout the abdominal and pelvic vasculature, without definite aneurysm. Moderate atherosclerosis throughout the abdominal and pelvic vasculature, without definite aneurysm. Status post cholecystectomy. Stable right liver lesion  GES 01/02/2015 was a normal gastric emptying study. Stool pathogen panel on 01/21/15 and toxigenic C. Difficile PCR was negative.   Admission 7/30 - 8/4 with flare of n/v/abd pain, diarrhea.   COPD/CHF flare, syncope, gout, urine grew E coli.  Seen 7/30 for inpt GI consult.  Alk phos 156. Lipase 68.  Dr Fuller Plan did not suggest any additional GI studies. Questioned possible mesenteric ischemia. Anusol HC creama added.  She was discharged on Prednisone 40 mg daily (for gout), po iron, Florastor,  Nexium, Pepcid,  Bentyl, and Keflex for UTI.   Patient started having a flare of N/V/abdominal pain over the weekend.  Atypically she had a formed bowel movement on Saturday afternoon. Her next bowel movement movement was this morning when it was loose/watery and black. She vomited once, undigested food she did eaten 3 or 4 hours previously. No blood or coffee grounds in the emesis.  Normally acetaminophen helps with pain if taken along with Zofran for the nausea.. Pain at its worse scores 7-8/10 and generally locates in the mid abdomen. CT abdomen/pelvis without contrast 03/09/15: low attenuation area with calcification in right liver (likely sequelae of remote trauma).  Pancreas normal. Scattered tics. Multiple splenules post splenectomy.      Past Medical History  Diagnosis Date  . Essential hypertension, benign   . Coronary atherosclerosis of native coronary artery 01/04/2006    Tiny OM1 70-90% ostial stenosis, no other CAD  . COPD (chronic obstructive pulmonary disease)   . Esophageal reflux   . Dyslipidemia   . Gout   . Colon polyps 2011    Tubular  adenomatous polyps  . Spinal stenosis of lumbar region     chronic low back pain.   . Bulging lumbar disc   . Chronic diastolic heart failure   . PNA (pneumonia) 2012    With pleural effusion, requiring thoracentesis  . Chronic bronchitis   . On home oxygen therapy   . OSA (obstructive sleep apnea)     Marginally compliant with CPAP  . Type II diabetes mellitus   . Arthritis   . Diverticulosis   . GERD (gastroesophageal reflux disease)   . Anxiety   . Hyperlipidemia   . Sarcoidosis of lung   . CKD (chronic kidney disease) stage 4, GFR 15-29 ml/min   . IBS (irritable bowel syndrome)   . Cataracts, both eyes   . Headache(784.0)   . Lactic acidosis 01/21/2015  . Chronic CHF (congestive heart failure)     Past Surgical History  Procedure Laterality Date  . Vesicovaginal fistula closure w/  total abdominal hysterectomy  1992  . Bilateral total knee replacements Bilateral Rt=5/04 & Lft=1/09    by DrAlusio  . 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 Zella Richer  . Appendectomy  1957  . Tonsillectomy  1968  . Cholecystectomy  1980's  . Abdominal hysterectomy  1992  . Reduction mammaplasty Bilateral 1980's  . Cardiac catheterization  1990's  . Thoracentesis  2012  . Joint replacement    . Av fistula placement Left 12/31/2013    Procedure: ARTERIOVENOUS (AV) FISTULA CREATION;  Surgeon: Elam Dutch, MD;  Location: [facility];  Service: Vascular;  Laterality: Left;  . Colonoscopy w/ biopsies and polypectomy    . Av fistula placement Left 03/18/2014    Procedure: ARTERIOVENOUS (AV) FISTULA CREATION- LEFT BRACHIOCEPHALIC ;  Surgeon: Elam Dutch, MD;  Location: [facility];  Service: Vascular;  Laterality: Left;  . Ligation of arteriovenous  fistula Left 03/18/2014    Procedure: LIGATION OF ARTERIOVENOUS  FISTULA- LEFT RADIOCEPHALIC;  Surgeon: Elam Dutch, MD;  Location: [facility];  Service: Vascular;  Laterality: Left;  . Cardiac catheterization N/A 01/27/2015    Procedure: Right Heart Cath;   Surgeon: Larey Dresser, MD;  Location: [facility];  Service: Cardiovascular;  Laterality: N/A;    Prior to Admission medications   Medication Sig Start Date End Date Taking? Authorizing Provider  acetaminophen (TYLENOL) 500 MG tablet Take 1,000 mg by mouth 2 (two) times daily as needed (pain).    Yes Historical Provider, MD  albuterol (PROVENTIL HFA;VENTOLIN HFA) 108 (90 BASE) MCG/ACT inhaler Inhale 2 puffs into the lungs every 4 (four) hours as needed for wheezing or shortness of breath.    Yes Historical Provider, MD  albuterol (PROVENTIL) (2.5 MG/3ML) 0.083% nebulizer solution Take 2.5 mg by nebulization every 2 (two) hours as needed for wheezing or shortness of breath.   Yes Historical Provider, MD  Alpha-D-Galactosidase (BEANO PO) Take 1 tablet by mouth daily.   Yes Historical Provider, MD  Apremilast (OTEZLA) 30 MG TABS Take 30 mg by mouth daily.    Yes  Historical Provider, MD  atorvastatin (LIPITOR) 20 MG tablet Take 20 mg by mouth at bedtime.   Yes Historical Provider, MD  carvedilol (COREG) 12.5 MG tablet Take 0.5 tablets (6.25 mg total) by mouth 2 (two) times daily with a meal. 03/06/15  Yes Shanker Kristeen Mans, MD  cephALEXin (KEFLEX) 250 MG capsule Take 1 capsule (250 mg total) by mouth 2 (two) times daily. 03/06/15  Yes Shanker Kristeen Mans, MD  dicyclomine (BENTYL) 10 MG capsule Take 1 tab twice daily. Patient taking differently: Take 10 mg by mouth 2 (two) times daily. Take 1 tab twice daily. 02/28/15  Yes Amy S Esterwood, PA-C  esomeprazole (NEXIUM) 40 MG capsule Take 1 capsule by mouth 30 min before breakfast. 03/03/15  Yes Amy S Esterwood, PA-C  famotidine (PEPCID) 20 MG tablet Take 1 tablet (20 mg total) by mouth daily. 02/02/15  Yes Ripudeep Krystal Eaton, MD  feeding supplement (BOOST / RESOURCE BREEZE) LIQD Take 1 Container by mouth 3 (three) times daily between meals. 03/06/15  Yes Shanker Kristeen Mans, MD  ferrous sulfate 325 (65 FE) MG tablet Take 325 mg by mouth daily with breakfast.   Yes  Historical Provider, MD  fluticasone (FLONASE) 50 MCG/ACT nasal spray Place 2 sprays into both nostrils daily as needed for allergies or rhinitis.    Yes Historical Provider, MD  Fluticasone-Salmeterol (ADVAIR) 250-50 MCG/DOSE AEPB Inhale 1 puff into the lungs 2 (two) times daily. 09/25/14  Yes Noralee Space, MD  furosemide (LASIX) 80 MG tablet Take 1 tablet (80 mg total) by mouth 2 (two) times daily. 03/06/15  Yes Shanker Kristeen Mans, MD  hydrocortisone (ANUSOL-HC) 2.5 % rectal cream Place 1 application rectally 4 (four) times daily. 02/28/15  Yes Amy S Esterwood, PA-C  insulin lispro (HUMALOG KWIKPEN) 100 UNIT/ML KiwkPen Inject 0.01-0.1 mLs (1-10 Units total) into the skin 3 (three) times daily. Sliding scale CBG 70 - 120: 0 units CBG 121 - 150: 1 unit,  CBG 151 - 200: 2 units,  CBG 201 - 250: 3 units,  CBG 251 - 300: 5 units,  CBG 301 - 350: 7 units,  CBG 351 - 400: 9 units   CBG > 400: 10 units and notify your MD 02/02/15  Yes Ripudeep Krystal Eaton, MD  isosorbide mononitrate (IMDUR) 30 MG 24 hr tablet Take 15 mg by mouth daily. 02/20/15  Yes Historical Provider, MD  loperamide (IMODIUM) 2 MG capsule Take 1 capsule (2 mg total) by mouth 2 (two) times daily as needed for diarrhea or loose stools. 02/02/15  Yes Ripudeep Krystal Eaton, MD  Multiple Vitamins-Minerals (MULTIVITAMIN WITH MINERALS) tablet Take 1 tablet by mouth daily.   Yes Historical Provider, MD  ondansetron (ZOFRAN) 4 MG tablet Take 1 tablet (4 mg total) by mouth every 8 (eight) hours as needed for nausea or vomiting. 02/28/15  Yes Amy S Esterwood, PA-C  predniSONE (DELTASONE) 20 MG tablet Take 2 tablets (40 mg total) by mouth daily with breakfast. 03/06/15  Yes Shanker Kristeen Mans, MD  promethazine (PHENERGAN) 25 MG tablet Take 1 tablet (25 mg total) by mouth every 4 (four) hours as needed for nausea or vomiting. 02/02/15  Yes Ripudeep Krystal Eaton, MD  saccharomyces boulardii (FLORASTOR) 250 MG capsule Take 1 capsule (250 mg total) by mouth 2 (two) times daily. 02/02/15  Yes  Ripudeep Krystal Eaton, MD  sodium bicarbonate 650 MG tablet Take 1 tablet (650 mg total) by mouth 2 (two) times daily. 02/02/15  Yes Ripudeep Krystal Eaton, MD  Nelva Nay  SOLOSTAR 300 UNIT/ML SOPN Inject 40 Units into the skin every morning. 11/21/14  Yes Historical Provider, MD  triamcinolone cream (KENALOG) 0.1 % Apply 1 application topically 2 (two) times daily.   Yes Historical Provider, MD  warfarin (COUMADIN) 1 MG tablet Take 1.5 tablets (1.5 mg total) by mouth every evening. Take 3 mg today -03/06/15-and then resume 1.5 mg from 8/5 03/06/15  Yes Shanker Kristeen Mans, MD  isosorbide-hydrALAZINE (BIDIL) 20-37.5 MG per tablet Take 0.5 tablets by mouth 3 (three) times daily. 03/06/15   Shanker Kristeen Mans, MD    Scheduled Meds: . sodium chloride  1,000 mL Intravenous Once  . atorvastatin  20 mg Oral QHS  . carvedilol  6.25 mg Oral BID WC  . dicyclomine  10 mg Oral BID  . feeding supplement  1 Container Oral TID BM  . ferrous sulfate  325 mg Oral Q breakfast  . hydrocortisone  1 application Rectal QID  .  HYDROmorphone (DILAUDID) injection  1 mg Intravenous Once  . insulin aspart  0-5 Units Subcutaneous QHS  . insulin aspart  0-9 Units Subcutaneous TID WC  . Insulin Glargine  20 Units Subcutaneous q morning - 10a  . multivitamin with minerals  1 tablet Oral Daily  . ondansetron (ZOFRAN) IV  4 mg Intravenous Once  . pantoprazole (PROTONIX) IV  40 mg Intravenous Q24H  . saccharomyces boulardii  250 mg Oral BID  . sodium bicarbonate  650 mg Oral BID  . warfarin  1.5 mg Oral QPM  . Warfarin - Physician Dosing Inpatient   Does not apply q1800   Infusions: . sodium chloride 0.9 % 1,000 mL with potassium chloride 20 mEq infusion     PRN Meds: acetaminophen **OR** acetaminophen, alum & mag hydroxide-simeth, HYDROmorphone (DILAUDID) injection, ondansetron **OR** ondansetron (ZOFRAN) IV   Allergies as of 03/09/2015 - Review Complete 03/09/2015  Allergen Reaction Noted  . Adhesive [tape] Other (See Comments)   . Avelox  [moxifloxacin hcl in nacl] Other (See Comments) 04/06/2011  . Codeine Other (See Comments) 09/24/2010  . Guaifenesin Nausea And Vomiting   . Latex Swelling   . Oxycodone Nausea And Vomiting     Family History  Problem Relation Age of Onset  . Diabetes Mother   . Heart disease Mother   . Hyperlipidemia Mother   . Varicose Veins Mother   . Breast cancer Maternal Aunt   . Heart disease Father   . Deep vein thrombosis Father   . Hyperlipidemia Father   . Heart disease Sister     before age 41  . Cancer Sister   . Diabetes Sister   . Hyperlipidemia Sister   . Heart attack Sister   . Heart disease Brother   . Diabetes Brother   . Hyperlipidemia Brother   . Heart attack Brother   . Hyperlipidemia Sister     History   Social History  . Marital Status: Single    Spouse Name: N/A  . Number of Children: 0  . Years of Education: N/A   Occupational History  . Retired   . SECRETARY    Social History Main Topics  . Smoking status: Never Smoker   . Smokeless tobacco: Never Used  . Alcohol Use: No  . Drug Use: No  . Sexual Activity: Not Currently   Other Topics Concern  . Not on file   Social History Narrative   Lives in [place] alone.  Never married.   Retired Art therapist at [employer]  OF SYSTEMS: Constitutional:  Generally feels kind of weak and tired. No falls. ENT:  No nose bleeds Pulm:  No cough, no dyspnea. CV:  No palpitations, no LE edema. No chest pain GU:  No hematuria, no frequency.  No oliguria GI:  Per HPI Heme:  No unusual bleeding or bruising.  Transfusions:  none Neuro:  No headaches, no peripheral tingling or numbness.  No seizures Derm:  No itching, no rash or sores.  Endocrine:  No sweats or chills.  No polyuria or dysuria Immunization:  Not queried. Travel:  None beyond local counties in last few months.    PHYSICAL EXAM: Vital signs in last 24 hours: Filed Vitals:   03/10/15 0847  BP: 126/85  Pulse: 105  Temp: 98.1  F (36.7 C)  Resp: 17   Wt Readings from Last 3 Encounters:  03/10/15 169 lb 5 oz (76.8 kg)  03/06/15 173 lb (78.472 kg)  02/28/15 170 lb (77.111 kg)   General: Pleasant, comfortable, slightly cushingoid appearing AAF. Head:  No swelling, no asymmetry.  Eyes:  No scleral icterus. No conjunctival pallor. Ears:  Not HOH.  Nose:  No congestion or discharge Mouth:  Moist, clear MM Neck:  No JVD, no masses, no TMG. Lungs:  CTA bilaterally. No cough. No labored breathing. Heart: RRR. No MRG. S1, S2 audible. Abdomen:  Soft, nondistended. Bowel sounds hypoactive but no tinkling or tympanitic bowel sounds. Minimal tenderness in the mid upper abdomen into the epigastrium. No guarding or rebound..   Rectal: Deferred rectal exam. Per RN, the stool earlier today was watery and black but did not smell melanotic and did not have visible blood.   Musc/Skeltl: no joint swelling or contracture Extremities:  No CCE.  Neurologic:  Oriented 3. Moves all 4 limbs, strength not tested. No tremor. Alert. Skin:  No rash or sores. Nodes:  No cervical or inguinal adenopathy.   Psych:  Pleasant, in good spirits. Relaxed, cooperative.  Intake/Output from previous day:   Intake/Output this shift: Total I/O In: 320 [P.O.:320] Out: 550 [Urine:550]  LAB RESULTS:  Recent Labs  03/09/15 1540 03/10/15 0537  WBC 12.9* 12.1*  HGB 14.2 12.7  HCT 42.0 38.6  PLT 546* 490*   BMET Lab Results  Component Value Date   NA 132* 03/10/2015   NA 133* 03/09/2015   NA 134* 03/06/2015   K 3.5 03/10/2015   K 3.7 03/09/2015   K 4.0 03/06/2015   CL 97* 03/10/2015   CL 96* 03/09/2015   CL 99* 03/06/2015   CO2 21* 03/10/2015   CO2 22 03/09/2015   CO2 23 03/06/2015   GLUCOSE 129* 03/10/2015   GLUCOSE 173* 03/09/2015   GLUCOSE 165* 03/06/2015   BUN 72* 03/10/2015   BUN 76* 03/09/2015   BUN 66* 03/06/2015   CREATININE 4.16* 03/10/2015   CREATININE 3.90* 03/09/2015   CREATININE 4.67* 03/06/2015   CALCIUM 9.2  03/10/2015   CALCIUM 9.8 03/09/2015   CALCIUM 9.2 03/06/2015   LFT  Recent Labs  03/09/15 1540  PROT 6.4*  ALBUMIN 2.8*  AST 32  ALT 19  ALKPHOS 148*  BILITOT 0.8   PT/INR Lab Results  Component Value Date   INR 1.84* 03/09/2015   INR 2.03* 03/06/2015   INR 3.45* 03/05/2015      Ref. Range 03/04/2015 12:15  C Diff interpretation Unknown C. difficile pres...  C Diff toxin Latest Ref Range: NEGATIVE  NEGATIVE  C Diff antigen Latest Ref Range: NEGATIVE  POSITIVE (A)  Lipase     Component Value Date/Time   LIPASE 50 03/09/2015 1540    Drugs of Abuse     Component Value Date/Time   LABOPIA POSITIVE* 03/04/2015 0035   COCAINSCRNUR NONE DETECTED 03/04/2015 0035   LABBENZ NONE DETECTED 03/04/2015 0035   AMPHETMU NONE DETECTED 03/04/2015 0035   THCU NONE DETECTED 03/04/2015 0035   LABBARB NONE DETECTED 03/04/2015 0035     RADIOLOGY STUDIES: Ct Abdomen Pelvis Wo Contrast  03/09/2015   CLINICAL DATA:  Patient recently admitted to the hospital for abdominal pain and nausea/vomiting. Returning abdominal pain and cramping.  EXAM: CT ABDOMEN AND PELVIS WITHOUT CONTRAST  TECHNIQUE: Multidetector CT imaging of the abdomen and pelvis was performed following the standard protocol without IV contrast.  COMPARISON:  CT abdomen pelvis 01/20/2015  FINDINGS: Lower chest: Coronary arterial vascular calcifications. Normal heart size. Scarring and or atelectasis within right greater than left lower lobes. No pleural effusion.  Hepatobiliary: Stable irregular area of low attenuation and calcification within the posterior right hepatic lobe, likely sequelae of remote trauma. No new hepatic lesions identified. Post cholecystectomy changes.  Pancreas: Unremarkable  Spleen: Splenosis.  Surgically absent spleen.  Adrenals/Urinary Tract: Normal adrenal glands. Kidneys are symmetric in size. No nephroureterolithiasis. No hydronephrosis. Urinary bladder is unremarkable.  Stomach/Bowel: Oral contrast  material to the rectum. Few scattered colonic diverticuli. No evidence for acute diverticulitis. No evidence for bowel obstruction. No free fluid or free intraperitoneal air.  Vascular/Lymphatic: Normal caliber abdominal aorta. No retroperitoneal adenopathy.  Other: None  Musculoskeletal: No aggressive or acute appearing osseous lesions.  IMPRESSION: No acute process within the abdomen or pelvis.  Status post splenectomy with multiple splenules.  Post cholecystectomy.   Electronically Signed   By: Lovey Newcomer M.D.   On: 03/09/2015 20:21    ENDOSCOPIC STUDIES: Per HPI  IMPRESSION:   *  Recurrent episode of abdominal pain,n/v and diarrhea.  Unrevealing EGD and colonoscopy in 12/2009.  Moderate abdomino-pelvic atherosclerosis on CT scans. Normal GES.   *  C. difficile antigen positive but C. difficile toxin negative.  This generally represents colonization rather than true C. difficile infection/PMC.  Was on Keflex for E coli UTI after recent 8/4 discharge  *  Stage 4 CKD.    *  Chronic Coumadin for LA thrombosis, dx 01/2015.  *  Chronic anemia.  On po iron at home.     PLAN:     *  Per Dr Carlean Purl.   *  Wonder about stopping or slowing down on daily po iron, the ferritin of 416, Iron of 73 on 6/24, indicates iron deficiency of 08/2014 resolved.   Azucena Freed  03/10/2015, 9:55 AM Pager: [phone]  [facility] Attending  I have also seen and assessed the patient and agree with the advanced practitioner's assessment and plan. This poor lady has had periumbilical abdominal pain, nausea and vomiting and diarrhea - all episodic, since May. She says after she had a gout flare it seemed to start. We have been limited by renal failure and inability to use IV contrast to evaluate further for suspected mesenteric ischemia.  Seems like EGD and colonoscopy unlikely to reveal a cause but may need to do these. C diff is possible but pattern of sxs not classic though perhaps it is now in addition to her  other problems, await PCR.  Will order vascular lab for mesenteric doppler study to evaluate for mesenteric ischemia  Gatha Mayer, MD, [facility] [phone] (pager) 03/10/2015 6:09 PM

## 2015-03-10 NOTE — Progress Notes (Signed)
IV team is awaiting approval from nephrology for PICC line placement.  [name], [name] Bang, RN VAST

## 2015-03-10 NOTE — Progress Notes (Signed)
PROGRESS NOTE  [name] GYI:[id number] DOB: Feb 01, 1946 DOA: 03/09/2015 PCP: Antony Blackbird, MD  Brief history 69 year old female with a history of COPD (2L at home), chronic respiratory failure, chronic systolic and diastolic CHF, CKD stage IV, LV thrombus on anticoagulation, hypertension, diabetes mellitus type 2, sarcoidosis presented with nausea, vomiting, diarrhea, abdominal pain. The patient was recently discharged from the hospital on 03/06/2015 with a similar presentation. The patient was seen by gastroenterology at that time who felt that this may be due to a low flow state and chronic mesenteric ischemia. At the time of discharge, the patient did not have any abdominal pain and was tolerating her diet. However, the patient developed abdominal pain with nausea and vomiting on 03/09/2015.  Assessment/Plan: Abdominal pain with nausea, vomiting, diarrhea  -Thought to be secondary to mesenteric ischemia from low flow state. -03/09/2015 CT abdomen and pelvis negative for any acute findings  -Conservative treatment for now  -IV fluids judiciously--patient was given 1 L prior to arrival to the medical floor -Bowel rest and gradually advance diet as tolerated--clear liquids for now -Follow up C. Difficile--await results -01/02/2015 gastric emptying study unremarkable  -Again, the patient had lactic acidosis secondary to hypovolemia which is likely contributing to her abdominal pain -01/05/2010 EGD unremarkable -01/05/2010 colonoscopy revealed 4 polyps with moderate diverticulosis. 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 -Patient was seen by gastroenterology on the prior admission--did not feel patient warranted any further endoscopic intervention at this time -Continue clinical liquids for now -Check stool lactoferrin  Active Problems: chronic systolic and diastolic CHF: -35/00/9381 echo EF 82-99%, grade 1 diastolic dysfunction, no WMA   -She appears to be  euvolemic to hypovolemic at this time -Continue judicious fluids and monitor weights -pt d/c on  Lasix 80 mg twice a day, Coreg--> continue carvedilol  -Hold furosemide for today as the patient's renal function is worsening and she actually is down 4 pounds from her discharge weight  -Reevaluate for restarting furosemide 03/11/2015  -She was also discharged on BiDil, one-half tab tid--> hold for now to allow for permissive HTN--reevaluate to restart on 03/11/2015 -Discharge weight 173 pounds  -Daily weights   Acute on chronic kidney disease stage IV:  -Suspected cardiorenal syndrome.   -I feel that the patient's acute worsening is likely due to hemodynamic factors as she did have transient relative hypotension at the time of admission -Follow serum creatinine trend -Patient's renal function has progressively worsened since February 2016 -Reconsult Nephrology if renal function continues to worsen  Syncopal episode:  -No further syncopal episodes since last hospitalization -Patient was followed by cardiology during last hospitalization--patient did not have any arrhythmias throughout her entire hospitalization -Doubt cardiac etiology, no major arrhythmias seen on telemetry.  -It was thought to be possibly from Hypotensive episodes. no further workup recommended   LV thrombus:  Continue Coumadin  Presumed gout:  -Patient was discharged on prednisone for 3 days  -We'll not restart at this time as the patient is not complaining of any particular symptoms at this time -In fact, the patient never took her prednisone after discharge  Diabetes Mellitus type 2 -Takes Toujeo 40 units at thome -Lantus 20 units daily for now and monitor CBGs   Hypertension:  -Allow for some permissive hypertension to increase renal perfusion -continue Coreg 6.25 mg twice a day, Lasix 80 mg twice a day, and BiDil.  -Follow  GERD:  -Continue PPI  History of pulmonary  hypertension:  -PA pressure 62 mmHg  by last echo on 6/21   Family Communication:   Pt at beside Disposition Plan:   Anticipate hospitalization 2-3 days       Procedures/Studies: Ct Abdomen Pelvis Wo Contrast  03/09/2015   CLINICAL DATA:  Patient recently admitted to the hospital for abdominal pain and nausea/vomiting. Returning abdominal pain and cramping.  EXAM: CT ABDOMEN AND PELVIS WITHOUT CONTRAST  TECHNIQUE: Multidetector CT imaging of the abdomen and pelvis was performed following the standard protocol without IV contrast.  COMPARISON:  CT abdomen pelvis 01/20/2015  FINDINGS: Lower chest: Coronary arterial vascular calcifications. Normal heart size. Scarring and or atelectasis within right greater than left lower lobes. No pleural effusion.  Hepatobiliary: Stable irregular area of low attenuation and calcification within the posterior right hepatic lobe, likely sequelae of remote trauma. No new hepatic lesions identified. Post cholecystectomy changes.  Pancreas: Unremarkable  Spleen: Splenosis.  Surgically absent spleen.  Adrenals/Urinary Tract: Normal adrenal glands. Kidneys are symmetric in size. No nephroureterolithiasis. No hydronephrosis. Urinary bladder is unremarkable.  Stomach/Bowel: Oral contrast material to the rectum. Few scattered colonic diverticuli. No evidence for acute diverticulitis. No evidence for bowel obstruction. No free fluid or free intraperitoneal air.  Vascular/Lymphatic: Normal caliber abdominal aorta. No retroperitoneal adenopathy.  Other: None  Musculoskeletal: No aggressive or acute appearing osseous lesions.  IMPRESSION: No acute process within the abdomen or pelvis.  Status post splenectomy with multiple splenules.  Post cholecystectomy.   Electronically Signed   By: Lovey Newcomer M.D.   On: 03/09/2015 20:21   Dg Chest 2 View  02/24/2015   CLINICAL DATA:  Short of breath and weakness  EXAM: CHEST  2 VIEW  COMPARISON:  CT 02/14/2015  FINDINGS: Normal mediastinum and cardiac silhouette. Chronic central  bronchitic markings. Normal pulmonary vasculature. No effusion, infiltrate, or pneumothorax.  IMPRESSION: Mild bronchitic markings.  No acute findings.   Electronically Signed   By: Suzy Bouchard M.D.   On: 02/24/2015 17:02   Ct Chest High Resolution  02/14/2015   CLINICAL DATA:  Sarcoid. Off prednisone, on Coumadin. Evaluate for disease progression. Chronic shortness of breath.  EXAM: CT CHEST WITHOUT CONTRAST  TECHNIQUE: Multidetector CT imaging of the chest was performed following the standard protocol without intravenous contrast. High resolution imaging of the lungs, as well as inspiratory and expiratory imaging, was performed.  COMPARISON:  04/28/2014 and 10/04/2010.  FINDINGS: Mediastinum/Nodes: No pathologically enlarged mediastinal or axillary lymph nodes. Hilar regions are difficult to definitively evaluate without IV contrast. Atherosclerotic calcification of the arterial vasculature, including coronary arteries. Heart is mildly enlarged. No pericardial effusion.  Lungs/Pleura: Mild linear scarring and volume loss in both lower lobes, unchanged. No peribronchovascular nodularity. No subpleural reticulation, traction bronchiectasis/ bronchiolectasis, ground-glass, architectural distortion or honeycombing. No air trapping. Lungs are otherwise clear. No pleural fluid. Airway is unremarkable.  Upper abdomen: Partially calcified low-attenuation lesion in the posterior dome of the liver measures 1.9 cm and is unchanged over multiple prior exams, indicative of a benign lesion. Visualized portions of the liver and stomach are grossly unremarkable. Splenule in the left upper quadrant.  Musculoskeletal: No worrisome lytic or sclerotic lesions.  IMPRESSION: 1. No CT findings indicative of sarcoid. 2. Coronary artery calcification.   Electronically Signed   By: Lorin Picket M.D.   On: 02/14/2015 08:11         Subjective:   Objective: Filed Vitals:   03/09/15 2340 03/09/15 2353 03/10/15 0015  03/10/15 0439  BP:   149/99  117/53  Pulse: 101 101 78 108  Temp:   98.1 F (36.7 C) 98.5 F (36.9 C)  TempSrc:   Oral Oral  Resp:   18 16  Weight:   76.8 kg (169 lb 5 oz)   SpO2: 94% 92% 100% 99%    Intake/Output Summary (Last 24 hours) at 03/10/15 0800 Last data filed at 03/10/15 0553  Gross per 24 hour  Intake      0 ml  Output      0 ml  Net      0 ml   Weight change:  Exam:   General:  Pt is alert, follows commands appropriately, not in acute distress  HEENT: No icterus, No thrush, No neck mass, [place]/AT  Cardiovascular: RRR, S1/S2, no rubs, no gallops  Respiratory: CTA bilaterally, no wheezing, no crackles, no rhonchi  Abdomen: Soft/+BS, non tender, non distended, no guarding  Extremities: No edema, No lymphangitis, No petechiae, No rashes, no synovitis  Data Reviewed: Basic Metabolic Panel:  Recent Labs Lab 03/04/15 0248 03/05/15 0620 03/06/15 0601 03/09/15 1540 03/10/15 0537  NA 131* 137 134* 133* 132*  K 5.6* 4.6 4.0 3.7 3.5  CL 105 106 99* 96* 97*  CO2 18* 22 23 22  21*  GLUCOSE 72 89 165* 173* 129*  BUN 53* 59* 66* 76* 72*  CREATININE 4.91* 5.08* 4.67* 3.90* 4.16*  CALCIUM 8.9 9.3 9.2 9.8 9.2   Liver Function Tests:  Recent Labs Lab 03/09/15 1540  AST 32  ALT 19  ALKPHOS 148*  BILITOT 0.8  PROT 6.4*  ALBUMIN 2.8*    Recent Labs Lab 03/09/15 1540  LIPASE 50   No results for input(s): AMMONIA in the last 168 hours. CBC:  Recent Labs Lab 03/05/15 0620 03/09/15 1540 03/10/15 0537  WBC 9.8 12.9* 12.1*  NEUTROABS  --  6.8  --   HGB 12.4 14.2 12.7  HCT 37.4 42.0 38.6  MCV 88.8 86.4 87.7  PLT 534* 546* 490*   Cardiac Enzymes: No results for input(s): CKTOTAL, CKMB, CKMBINDEX, TROPONINI in the last 168 hours. BNP: Invalid input(s): POCBNP CBG:  Recent Labs Lab 03/06/15 0742 03/06/15 1138 03/06/15 1641 03/10/15 0102 03/10/15 0725  GLUCAP 162* 345* 297* 144* 136*    Recent Results (from the past 240 hour(s))  MRSA PCR  Screening     Status: None   Collection Time: 03/02/15  7:01 PM  Result Value Ref Range Status   MRSA by PCR NEGATIVE NEGATIVE Final    Comment:        The GeneXpert MRSA Assay (FDA approved for NASAL specimens only), is one component of a comprehensive MRSA colonization surveillance program. It is not intended to diagnose MRSA infection nor to guide or monitor treatment for MRSA infections.   Urine culture     Status: None   Collection Time: 03/04/15 12:35 AM  Result Value Ref Range Status   Specimen Description URINE, RANDOM  Final   Special Requests NONE  Final   Culture >=100,000 COLONIES/mL ESCHERICHIA COLI  Final   Report Status 03/06/2015 FINAL  Final   Organism ID, Bacteria ESCHERICHIA COLI  Final      Susceptibility   Escherichia coli - MIC*    AMPICILLIN <=2 SENSITIVE Sensitive     CEFAZOLIN <=4 SENSITIVE Sensitive     CEFTRIAXONE <=1 SENSITIVE Sensitive     CIPROFLOXACIN <=0.25 SENSITIVE Sensitive     GENTAMICIN <=1 SENSITIVE Sensitive     IMIPENEM <=0.25 SENSITIVE Sensitive  NITROFURANTOIN <=16 SENSITIVE Sensitive     TRIMETH/SULFA <=20 SENSITIVE Sensitive     AMPICILLIN/SULBACTAM <=2 SENSITIVE Sensitive     PIP/TAZO <=4 SENSITIVE Sensitive     * >=100,000 COLONIES/mL ESCHERICHIA COLI  C difficile quick scan w PCR reflex     Status: Abnormal   Collection Time: 03/04/15 12:15 PM  Result Value Ref Range Status   C Diff antigen POSITIVE (A) NEGATIVE Final   C Diff toxin NEGATIVE NEGATIVE Final   C Diff interpretation   Final    C. difficile present, but toxin not detected. This indicates colonization. In most cases, this does not require treatment. If patient has signs and symptoms consistent with colitis, consider treatment.     Scheduled Meds: . sodium chloride  1,000 mL Intravenous Once  . atorvastatin  20 mg Oral QHS  . carvedilol  6.25 mg Oral BID WC  . dicyclomine  10 mg Oral BID  . feeding supplement  1 Container Oral TID BM  . ferrous sulfate   325 mg Oral Q breakfast  . furosemide  80 mg Oral BID  . hydrocortisone  1 application Rectal QID  .  HYDROmorphone (DILAUDID) injection  1 mg Intravenous Once  . Insulin Glargine  20 Units Subcutaneous q morning - 10a  . isosorbide mononitrate  15 mg Oral Daily  . multivitamin with minerals  1 tablet Oral Daily  . ondansetron (ZOFRAN) IV  4 mg Intravenous Once  . pantoprazole (PROTONIX) IV  40 mg Intravenous Q24H  . predniSONE  40 mg Oral Q breakfast  . saccharomyces boulardii  250 mg Oral BID  . sodium bicarbonate  650 mg Oral BID  . warfarin  1.5 mg Oral QPM  . Warfarin - Physician Dosing Inpatient   Does not apply q1800   Continuous Infusions: . sodium chloride       Abeera Flannery, DO  [facility] Pager [phone]  If 7PM-7AM, please contact night-coverage [email or web address] Password [email or web address] 03/10/2015, 8:00 AM   LOS: 1 day

## 2015-03-10 NOTE — Progress Notes (Signed)
ANTICOAGULATION CONSULT NOTE - Initial Consult  Pharmacy Consult for warfarin Indication: hx LV thrombus  Allergies  Allergen Reactions  . Adhesive [Tape] Other (See Comments)    Blisters   . Avelox [Moxifloxacin Hcl In Nacl] Other (See Comments)    GI upset  . Codeine Other (See Comments)    "crazy"   . Guaifenesin Nausea And Vomiting    Takes Mucinex at home without issue  . Latex Swelling  . Oxycodone Nausea And Vomiting    Takes Percocet at home without issue    Patient Measurements: Weight: 169 lb 5 oz (76.8 kg)  Vital Signs: Temp: 98.1 F (36.7 C) (08/08 0847) Temp Source: Oral (08/08 0847) BP: 126/85 mmHg (08/08 0847) Pulse Rate: 105 (08/08 0847)  Labs:  Recent Labs  03/09/15 1540 03/10/15 0537  HGB 14.2 12.7  HCT 42.0 38.6  PLT 546* 490*  LABPROT 21.2*  --   INR 1.84*  --   CREATININE 3.90* 4.16*    Estimated Creatinine Clearance: 12.8 mL/min (by C-G formula based on Cr of 4.16).   Medical History: Past Medical History  Diagnosis Date  . Essential hypertension, benign   . Coronary atherosclerosis of native coronary artery 01/04/2006    Tiny OM1 70-90% ostial stenosis, no other CAD  . COPD (chronic obstructive pulmonary disease)   . Esophageal reflux   . Dyslipidemia   . Gout   . Colon polyps 2011    Tubular adenomatous polyps  . Spinal stenosis of lumbar region     chronic low back pain.   . Bulging lumbar disc   . Chronic diastolic heart failure   . PNA (pneumonia) 2012    With pleural effusion, requiring thoracentesis  . Chronic bronchitis   . On home oxygen therapy   . OSA (obstructive sleep apnea)     Marginally compliant with CPAP  . Type II diabetes mellitus   . Arthritis   . Diverticulosis   . GERD (gastroesophageal reflux disease)   . Anxiety   . Hyperlipidemia   . Sarcoidosis of lung   . CKD (chronic kidney disease) stage 4, GFR 15-29 ml/min   . IBS (irritable bowel syndrome)   . Cataracts, both eyes   . Headache(784.0)    . Lactic acidosis 01/21/2015  . Chronic CHF (congestive heart failure)     Medications:  Prescriptions prior to admission  Medication Sig Dispense Refill Last Dose  . acetaminophen (TYLENOL) 500 MG tablet Take 1,000 mg by mouth 2 (two) times daily as needed (pain).    03/08/2015 at Unknown time  . albuterol (PROVENTIL HFA;VENTOLIN HFA) 108 (90 BASE) MCG/ACT inhaler Inhale 2 puffs into the lungs every 4 (four) hours as needed for wheezing or shortness of breath.    Past Month at Unknown time  . albuterol (PROVENTIL) (2.5 MG/3ML) 0.083% nebulizer solution Take 2.5 mg by nebulization every 2 (two) hours as needed for wheezing or shortness of breath.   Past Month at Unknown time  . Alpha-D-Galactosidase (BEANO PO) Take 1 tablet by mouth daily.   03/09/2015 at Unknown time  . Apremilast (OTEZLA) 30 MG TABS Take 30 mg by mouth daily.    03/09/2015 at Unknown time  . atorvastatin (LIPITOR) 20 MG tablet Take 20 mg by mouth at bedtime.   03/08/2015 at Unknown time  . carvedilol (COREG) 12.5 MG tablet Take 0.5 tablets (6.25 mg total) by mouth 2 (two) times daily with a meal. 60 tablet 0 03/09/2015 at 1000  . cephALEXin (KEFLEX) 250  MG capsule Take 1 capsule (250 mg total) by mouth 2 (two) times daily. 6 capsule 0 03/08/2015 at Unknown time  . dicyclomine (BENTYL) 10 MG capsule Take 1 tab twice daily. (Patient taking differently: Take 10 mg by mouth 2 (two) times daily. Take 1 tab twice daily.) 60 capsule 0 03/09/2015 at Unknown time  . esomeprazole (NEXIUM) 40 MG capsule Take 1 capsule by mouth 30 min before breakfast. 30 capsule 6 03/09/2015 at Unknown time  . famotidine (PEPCID) 20 MG tablet Take 1 tablet (20 mg total) by mouth daily. 30 tablet 3 03/09/2015 at Unknown time  . feeding supplement (BOOST / RESOURCE BREEZE) LIQD Take 1 Container by mouth 3 (three) times daily between meals. 90 Container 0 03/09/2015 at Unknown time  . ferrous sulfate 325 (65 FE) MG tablet Take 325 mg by mouth daily with breakfast.   03/09/2015 at  Unknown time  . fluticasone (FLONASE) 50 MCG/ACT nasal spray Place 2 sprays into both nostrils daily as needed for allergies or rhinitis.    Past Month at Unknown time  . Fluticasone-Salmeterol (ADVAIR) 250-50 MCG/DOSE AEPB Inhale 1 puff into the lungs 2 (two) times daily. 180 each 3 03/09/2015 at Unknown time  . furosemide (LASIX) 80 MG tablet Take 1 tablet (80 mg total) by mouth 2 (two) times daily. 60 tablet 0 03/09/2015 at Unknown time  . hydrocortisone (ANUSOL-HC) 2.5 % rectal cream Place 1 application rectally 4 (four) times daily. 30 g 0 03/09/2015 at Unknown time  . insulin lispro (HUMALOG KWIKPEN) 100 UNIT/ML KiwkPen Inject 0.01-0.1 mLs (1-10 Units total) into the skin 3 (three) times daily. Sliding scale CBG 70 - 120: 0 units CBG 121 - 150: 1 unit,  CBG 151 - 200: 2 units,  CBG 201 - 250: 3 units,  CBG 251 - 300: 5 units,  CBG 301 - 350: 7 units,  CBG 351 - 400: 9 units   CBG > 400: 10 units and notify your MD 15 mL 3 03/09/2015 at Unknown time  . isosorbide mononitrate (IMDUR) 30 MG 24 hr tablet Take 15 mg by mouth daily.  0 03/09/2015 at Unknown time  . loperamide (IMODIUM) 2 MG capsule Take 1 capsule (2 mg total) by mouth 2 (two) times daily as needed for diarrhea or loose stools. 60 capsule 0 Past Month at Unknown time  . Multiple Vitamins-Minerals (MULTIVITAMIN WITH MINERALS) tablet Take 1 tablet by mouth daily.   03/09/2015 at Unknown time  . ondansetron (ZOFRAN) 4 MG tablet Take 1 tablet (4 mg total) by mouth every 8 (eight) hours as needed for nausea or vomiting. 90 tablet 3 Past Month at Unknown time  . predniSONE (DELTASONE) 20 MG tablet Take 2 tablets (40 mg total) by mouth daily with breakfast. 2 tablet 0 03/09/2015 at Unknown time  . promethazine (PHENERGAN) 25 MG tablet Take 1 tablet (25 mg total) by mouth every 4 (four) hours as needed for nausea or vomiting. 30 tablet 0 Past Month at Unknown time  . saccharomyces boulardii (FLORASTOR) 250 MG capsule Take 1 capsule (250 mg total) by mouth 2  (two) times daily. 60 capsule 3 03/09/2015 at Unknown time  . sodium bicarbonate 650 MG tablet Take 1 tablet (650 mg total) by mouth 2 (two) times daily. 60 tablet 3 03/09/2015 at Unknown time  . TOUJEO SOLOSTAR 300 UNIT/ML SOPN Inject 40 Units into the skin every morning.  3 03/09/2015 at Unknown time  . triamcinolone cream (KENALOG) 0.1 % Apply 1 application topically 2 (two)  times daily.   03/09/2015 at Unknown time  . warfarin (COUMADIN) 1 MG tablet Take 1.5 tablets (1.5 mg total) by mouth every evening. Take 3 mg today -03/06/15-and then resume 1.5 mg from 8/5 45 tablet 1 03/08/2015 at Unknown time  . isosorbide-hydrALAZINE (BIDIL) 20-37.5 MG per tablet Take 0.5 tablets by mouth 3 (three) times daily. 90 tablet 0     Assessment: 69 y/o female who presented to the ED with N/V/D and abdominal pain similar to recent admission. She takes warfarin PTA for hx LV thrombus (01/2015). Pharmacy consulted to manage inpatient. INR was subtherapeutic at 1.84 on 8/7. INR was not drawn this morning but will wait to draw until tomorrow morning. No bleeding noted, CBC is stable.  PTA: 1.5 mg daily  Goal of Therapy:  INR 2-3 Monitor platelets by anticoagulation protocol: Yes   Plan:  -Warfarin 3 mg PO tonight -INR daily -Monitor for s/sx of bleeding  [facility], Pharm.D., BCPS Clinical Pharmacist Pager: [phone] 03/10/2015 1:50 PM

## 2015-03-11 ENCOUNTER — Inpatient Hospital Stay (HOSPITAL_COMMUNITY): Payer: Medicare Other

## 2015-03-11 DIAGNOSIS — I700 Atherosclerosis of aorta: Secondary | ICD-10-CM

## 2015-03-11 DIAGNOSIS — R1033 Periumbilical pain: Secondary | ICD-10-CM | POA: Insufficient documentation

## 2015-03-11 LAB — RENAL FUNCTION PANEL
ALBUMIN: 2.4 g/dL — AB (ref 3.5–5.0)
Anion gap: 11 (ref 5–15)
BUN: 67 mg/dL — ABNORMAL HIGH (ref 6–20)
CHLORIDE: 98 mmol/L — AB (ref 101–111)
CO2: 23 mmol/L (ref 22–32)
Calcium: 9.3 mg/dL (ref 8.9–10.3)
Creatinine, Ser: 4.04 mg/dL — ABNORMAL HIGH (ref 0.44–1.00)
GFR calc non Af Amer: 11 mL/min — ABNORMAL LOW (ref >60)
GFR, EST AFRICAN AMERICAN: 12 mL/min — AB (ref >60)
Glucose, Bld: 165 mg/dL — ABNORMAL HIGH (ref 65–99)
Phosphorus: 5.1 mg/dL — ABNORMAL HIGH (ref 2.5–4.6)
Potassium: 3.9 mmol/L (ref 3.5–5.1)
Sodium: 132 mmol/L — ABNORMAL LOW (ref 135–145)

## 2015-03-11 LAB — GLUCOSE, CAPILLARY
GLUCOSE-CAPILLARY: 174 mg/dL — AB (ref 65–99)
GLUCOSE-CAPILLARY: 111 mg/dL — AB (ref 65–99)
GLUCOSE-CAPILLARY: 180 mg/dL — AB (ref 65–99)
Glucose-Capillary: 115 mg/dL — ABNORMAL HIGH (ref 65–99)

## 2015-03-11 LAB — CBC
HCT: 39.8 % (ref 36.0–46.0)
HEMOGLOBIN: 12.8 g/dL (ref 12.0–15.0)
MCH: 29 pg (ref 26.0–34.0)
MCHC: 32.2 g/dL (ref 30.0–36.0)
MCV: 90 fL (ref 78.0–100.0)
PLATELETS: 469 10*3/uL — AB (ref 150–400)
RBC: 4.42 MIL/uL (ref 3.87–5.11)
RDW: 17.1 % — ABNORMAL HIGH (ref 11.5–15.5)
WBC: 8.7 10*3/uL (ref 4.0–10.5)

## 2015-03-11 LAB — PROTIME-INR
INR: 2.54 — ABNORMAL HIGH (ref 0.00–1.49)
Prothrombin Time: 27 seconds — ABNORMAL HIGH (ref 11.6–15.2)

## 2015-03-11 LAB — FECAL LACTOFERRIN, QUANT: Fecal Lactoferrin: NEGATIVE

## 2015-03-11 MED ORDER — WARFARIN SODIUM 1 MG PO TABS
1.0000 mg | ORAL_TABLET | Freq: Once | ORAL | Status: AC
  Administered 2015-03-11: 1 mg via ORAL
  Filled 2015-03-11: qty 1

## 2015-03-11 MED ORDER — SODIUM CHLORIDE 0.9 % IJ SOLN
10.0000 mL | INTRAMUSCULAR | Status: DC | PRN
  Administered 2015-03-11: 20 mL
  Administered 2015-03-11: 30 mL
  Administered 2015-03-12 – 2015-03-15 (×6): 20 mL
  Filled 2015-03-11 (×8): qty 40

## 2015-03-11 NOTE — Progress Notes (Signed)
PROGRESS NOTE  Norvella A Marijo File GEZ:[id number] DOB: 29-Sep-1945 DOA: 03/09/2015 PCP: Antony Blackbird, MD  Brief history 69 year old female with a history of COPD (2L at home), chronic respiratory failure, chronic systolic and diastolic CHF, CKD stage IV, LV thrombus on anticoagulation, hypertension, diabetes mellitus type 2, sarcoidosis presented with nausea, vomiting, diarrhea, abdominal pain. The patient was recently discharged from the hospital on 03/06/2015 with a similar presentation. The patient was seen by gastroenterology at that time who felt that this may be due to a low flow state and chronic mesenteric ischemia. At the time of discharge, the patient did not have any abdominal pain and was tolerating her diet. However, the patient developed abdominal pain with nausea and vomiting on 03/09/2015.  Assessment/Plan: Abdominal pain with nausea, vomiting, diarrhea  -Thought to be secondary to mesenteric ischemia from low flow state as abdominal pain is worsened with meals -03/09/2015 CT abdomen and pelvis negative for any acute findings  -Conservative treatment for now  -IV fluids judiciously--patient was given 1 L prior to arrival to the medical floor -Bowel rest and gradually advance diet as tolerated--clear liquids for now -03/04/15- C. Difficile--antigen positive/toxin neg--may indicate colonization in the proper clinical context -03/10/15 C diff--neg toxin and antigen --positive stool lactoferrin -01/02/2015 gastric emptying study unremarkable  -Again, the patient had lactic acidosis secondary to hypovolemia which is likely contributing to her abdominal pain -01/05/2010 EGD unremarkable -01/05/2010 colonoscopy revealed 4 polyps with moderate diverticulosis. 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 -Patient was seen by gastroenterology on the prior admission--did not feel patient warranted any further endoscopic intervention at this time -Continue clear liquids  for now -appreciate GI consult-->await MR mesenteric angiogram -if continues to have loose stool and MR angiogram is neg--plan to order GI pathogen panel  Active Problems: chronic systolic and diastolic CHF: -65/10/5463 echo EF 68-12%, grade 1 diastolic dysfunction, no WMA  -She appears to be euvolemic to hypovolemic at this time -Continue judicious fluids and monitor weights -dischaged 03/06/15 on Lasix 80 mg twice a day, Coreg--> continue carvedilol  -Hold furosemide for today as the patient's renal function is now improving and she actually is down 5 pounds from her discharge weight  -Reevaluate for restarting furosemide 03/12/2015  -She was also discharged on BiDil, one-half tab tid--> hold for now to allow for permissive HTN--reevaluate to restart on 03/12/2015 -Discharge weight on 03/06/15-- 173 pounds  -03/11/15 weight--168 pounds -Daily weights   Acute on chronic kidney disease stage IV:  -Suspected cardiorenal syndrome.  -I feel that the patient's acute worsening is likely due to hemodynamic factors as she did have transient relative hypotension at the time of admission -Follow serum creatinine trend -Patient's renal function has progressively worsened since February 2016 -Reconsult Nephrology if renal function continues to worsen  Syncopal episode:  -No further syncopal episodes since last hospitalization -Patient was followed by cardiology during last hospitalization--patient did not have any arrhythmias throughout her entire hospitalization -Doubt cardiac etiology, no major arrhythmias seen on telemetry.  -It was thought to be possibly from Hypotensive episodes. no further workup recommended   LV thrombus:  Continue Coumadin  Presumed gout:  -Patient was discharged on prednisone for 3 days  -We'll not restart at this time as the patient is not complaining of any particular symptoms at this time -In fact, the patient never took her prednisone after  discharge  Diabetes Mellitus type 2 -Takes Toujeo 40 units at thome -Lantus 20  units daily for now and monitor CBGs  Hypertension:  -Allow for some permissive hypertension to increase renal perfusion -continue Coreg 6.25 mg twice a day, Lasix 80 mg twice a day, and BiDil.  -Follow  GERD:  -Continue PPI  History of pulmonary hypertension:  -PA pressure 62 mmHg by last echo on 6/21   Family Communication: Pt at beside Disposition Plan: Home 1-2 days pending GI workup    Procedures/Studies: Ct Abdomen Pelvis Wo Contrast  03/09/2015   CLINICAL DATA:  Patient recently admitted to the hospital for abdominal pain and nausea/vomiting. Returning abdominal pain and cramping.  EXAM: CT ABDOMEN AND PELVIS WITHOUT CONTRAST  TECHNIQUE: Multidetector CT imaging of the abdomen and pelvis was performed following the standard protocol without IV contrast.  COMPARISON:  CT abdomen pelvis 01/20/2015  FINDINGS: Lower chest: Coronary arterial vascular calcifications. Normal heart size. Scarring and or atelectasis within right greater than left lower lobes. No pleural effusion.  Hepatobiliary: Stable irregular area of low attenuation and calcification within the posterior right hepatic lobe, likely sequelae of remote trauma. No new hepatic lesions identified. Post cholecystectomy changes.  Pancreas: Unremarkable  Spleen: Splenosis.  Surgically absent spleen.  Adrenals/Urinary Tract: Normal adrenal glands. Kidneys are symmetric in size. No nephroureterolithiasis. No hydronephrosis. Urinary bladder is unremarkable.  Stomach/Bowel: Oral contrast material to the rectum. Few scattered colonic diverticuli. No evidence for acute diverticulitis. No evidence for bowel obstruction. No free fluid or free intraperitoneal air.  Vascular/Lymphatic: Normal caliber abdominal aorta. No retroperitoneal adenopathy.  Other: None  Musculoskeletal: No aggressive or acute appearing osseous lesions.  IMPRESSION: No acute process  within the abdomen or pelvis.  Status post splenectomy with multiple splenules.  Post cholecystectomy.   Electronically Signed   By: Lovey Newcomer M.D.   On: 03/09/2015 20:21   Dg Chest 2 View  02/24/2015   CLINICAL DATA:  Short of breath and weakness  EXAM: CHEST  2 VIEW  COMPARISON:  CT 02/14/2015  FINDINGS: Normal mediastinum and cardiac silhouette. Chronic central bronchitic markings. Normal pulmonary vasculature. No effusion, infiltrate, or pneumothorax.  IMPRESSION: Mild bronchitic markings.  No acute findings.   Electronically Signed   By: Suzy Bouchard M.D.   On: 02/24/2015 17:02   Ct Chest High Resolution  02/14/2015   CLINICAL DATA:  Sarcoid. Off prednisone, on Coumadin. Evaluate for disease progression. Chronic shortness of breath.  EXAM: CT CHEST WITHOUT CONTRAST  TECHNIQUE: Multidetector CT imaging of the chest was performed following the standard protocol without intravenous contrast. High resolution imaging of the lungs, as well as inspiratory and expiratory imaging, was performed.  COMPARISON:  04/28/2014 and 10/04/2010.  FINDINGS: Mediastinum/Nodes: No pathologically enlarged mediastinal or axillary lymph nodes. Hilar regions are difficult to definitively evaluate without IV contrast. Atherosclerotic calcification of the arterial vasculature, including coronary arteries. Heart is mildly enlarged. No pericardial effusion.  Lungs/Pleura: Mild linear scarring and volume loss in both lower lobes, unchanged. No peribronchovascular nodularity. No subpleural reticulation, traction bronchiectasis/ bronchiolectasis, ground-glass, architectural distortion or honeycombing. No air trapping. Lungs are otherwise clear. No pleural fluid. Airway is unremarkable.  Upper abdomen: Partially calcified low-attenuation lesion in the posterior dome of the liver measures 1.9 cm and is unchanged over multiple prior exams, indicative of a benign lesion. Visualized portions of the liver and stomach are grossly  unremarkable. Splenule in the left upper quadrant.  Musculoskeletal: No worrisome lytic or sclerotic lesions.  IMPRESSION: 1. No CT findings indicative of sarcoid. 2. Coronary artery calcification.   Electronically Signed  By: Lorin Picket M.D.   On: 02/14/2015 08:11   Dg Chest Port 1 View  03/10/2015   CLINICAL DATA:  Central line placement today.  EXAM: PORTABLE CHEST - 1 VIEW  COMPARISON:  PA and lateral chest 02/24/2015.  FINDINGS: Right IJ central venous catheter is now in place with the tip projecting just above the superior cavoatrial junction. No pneumothorax is identified. The lungs appear clear. Heart size is normal.  IMPRESSION: Right IJ catheter tip projects just above the superior cavoatrial junction. Negative for pneumothorax.   Electronically Signed   By: Inge Rise M.D.   On: 03/10/2015 13:53         Subjective: Patient is feeling better today. She is still having abdominal pain but only had one loose stool today. Denies any fevers, chills, chest pain, shortness breath, nausea, vomiting, hematochezia, melena. No dysuria or hematuria. No rashes.  Objective: Filed Vitals:   03/11/15 0032 03/11/15 0443 03/11/15 0926 03/11/15 1700  BP: 123/59 121/90 121/78 139/59  Pulse: 80 72 86 69  Temp: 98.1 F (36.7 C) 98 F (36.7 C) 98.2 F (36.8 C) 98.2 F (36.8 C)  TempSrc: Oral Oral Oral Oral  Resp: 18 16 18 17   Weight: 76.538 kg (168 lb 11.8 oz)     SpO2: 96% 93% 99% 100%    Intake/Output Summary (Last 24 hours) at 03/11/15 1809 Last data filed at 03/11/15 1717  Gross per 24 hour  Intake    360 ml  Output   1400 ml  Net  -1040 ml   Weight change: -0.262 kg (-9.2 oz) Exam:   General:  Pt is alert, follows commands appropriately, not in acute distress  HEENT: No icterus, No thrush, No neck mass, [place]/AT  Cardiovascular: RRR, S1/S2, no rubs, no gallops  Respiratory: CTA bilaterally, no wheezing, no crackles, no rhonchi  Abdomen: Soft/+BS, minimal general  tender, non distended, no guarding  Extremities: No edema, No lymphangitis, No petechiae, No rashes, no synovitis  Data Reviewed: Basic Metabolic Panel:  Recent Labs Lab 03/05/15 0620 03/06/15 0601 03/09/15 1540 03/10/15 0537 03/11/15 1005  NA 137 134* 133* 132* 132*  K 4.6 4.0 3.7 3.5 3.9  CL 106 99* 96* 97* 98*  CO2 22 23 22  21* 23  GLUCOSE 89 165* 173* 129* 165*  BUN 59* 66* 76* 72* 67*  CREATININE 5.08* 4.67* 3.90* 4.16* 4.04*  CALCIUM 9.3 9.2 9.8 9.2 9.3  PHOS  --   --   --   --  5.1*   Liver Function Tests:  Recent Labs Lab 03/09/15 1540 03/11/15 1005  AST 32  --   ALT 19  --   ALKPHOS 148*  --   BILITOT 0.8  --   PROT 6.4*  --   ALBUMIN 2.8* 2.4*    Recent Labs Lab 03/09/15 1540  LIPASE 50   No results for input(s): AMMONIA in the last 168 hours. CBC:  Recent Labs Lab 03/05/15 0620 03/09/15 1540 03/10/15 0537 03/11/15 1005  WBC 9.8 12.9* 12.1* 8.7  NEUTROABS  --  6.8  --   --   HGB 12.4 14.2 12.7 12.8  HCT 37.4 42.0 38.6 39.8  MCV 88.8 86.4 87.7 90.0  PLT 534* 546* 490* 469*   Cardiac Enzymes: No results for input(s): CKTOTAL, CKMB, CKMBINDEX, TROPONINI in the last 168 hours. BNP: Invalid input(s): POCBNP CBG:  Recent Labs Lab 03/10/15 1656 03/10/15 2219 03/11/15 0738 03/11/15 1125 03/11/15 1712  GLUCAP 133* 124* 115* 174* 111*  Recent Results (from the past 240 hour(s))  MRSA PCR Screening     Status: None   Collection Time: 03/02/15  7:01 PM  Result Value Ref Range Status   MRSA by PCR NEGATIVE NEGATIVE Final    Comment:        The GeneXpert MRSA Assay (FDA approved for NASAL specimens only), is one component of a comprehensive MRSA colonization surveillance program. It is not intended to diagnose MRSA infection nor to guide or monitor treatment for MRSA infections.   Urine culture     Status: None   Collection Time: 03/04/15 12:35 AM  Result Value Ref Range Status   Specimen Description URINE, RANDOM  Final    Special Requests NONE  Final   Culture >=100,000 COLONIES/mL ESCHERICHIA COLI  Final   Report Status 03/06/2015 FINAL  Final   Organism ID, Bacteria ESCHERICHIA COLI  Final      Susceptibility   Escherichia coli - MIC*    AMPICILLIN <=2 SENSITIVE Sensitive     CEFAZOLIN <=4 SENSITIVE Sensitive     CEFTRIAXONE <=1 SENSITIVE Sensitive     CIPROFLOXACIN <=0.25 SENSITIVE Sensitive     GENTAMICIN <=1 SENSITIVE Sensitive     IMIPENEM <=0.25 SENSITIVE Sensitive     NITROFURANTOIN <=16 SENSITIVE Sensitive     TRIMETH/SULFA <=20 SENSITIVE Sensitive     AMPICILLIN/SULBACTAM <=2 SENSITIVE Sensitive     PIP/TAZO <=4 SENSITIVE Sensitive     * >=100,000 COLONIES/mL ESCHERICHIA COLI  C difficile quick scan w PCR reflex     Status: Abnormal   Collection Time: 03/04/15 12:15 PM  Result Value Ref Range Status   C Diff antigen POSITIVE (A) NEGATIVE Final   C Diff toxin NEGATIVE NEGATIVE Final   C Diff interpretation   Final    C. difficile present, but toxin not detected. This indicates colonization. In most cases, this does not require treatment. If patient has signs and symptoms consistent with colitis, consider treatment.  C difficile quick scan w PCR reflex     Status: None   Collection Time: 03/10/15 12:54 PM  Result Value Ref Range Status   C Diff antigen NEGATIVE NEGATIVE Final   C Diff toxin NEGATIVE NEGATIVE Final   C Diff interpretation Negative for toxigenic C. difficile  Final     Scheduled Meds: . atorvastatin  20 mg Oral QHS  . carvedilol  6.25 mg Oral BID WC  . dicyclomine  10 mg Oral BID  . feeding supplement  1 Container Oral TID BM  . ferrous sulfate  325 mg Oral Q breakfast  . hydrocortisone  1 application Rectal QID  . insulin aspart  0-5 Units Subcutaneous QHS  . insulin aspart  0-9 Units Subcutaneous TID WC  . insulin glargine  20 Units Subcutaneous Daily  . multivitamin with minerals  1 tablet Oral Daily  . ondansetron (ZOFRAN) IV  4 mg Intravenous Once  . pantoprazole  (PROTONIX) IV  40 mg Intravenous Q24H  . saccharomyces boulardii  250 mg Oral BID  . sodium bicarbonate  650 mg Oral BID  . warfarin  1 mg Oral ONCE-1800  . Warfarin - Pharmacist Dosing Inpatient   Does not apply q1800   Continuous Infusions:    Avrom Robarts, DO  [facility] Pager [phone]  If 7PM-7AM, please contact night-coverage [email or web address] Password [email or web address] 03/11/2015, 6:09 PM   LOS: 2 days

## 2015-03-11 NOTE — Progress Notes (Signed)
ANTICOAGULATION CONSULT NOTE - Follow Up Consult  Pharmacy Consult for warfarin Indication: hx LV thrombus  Allergies  Allergen Reactions  . Adhesive [Tape] Other (See Comments)    Blisters   . Avelox [Moxifloxacin Hcl In Nacl] Other (See Comments)    GI upset  . Codeine Other (See Comments)    "crazy"   . Guaifenesin Nausea And Vomiting    Takes Mucinex at home without issue  . Latex Swelling  . Oxycodone Nausea And Vomiting    Takes Percocet at home without issue    Patient Measurements: Weight: 168 lb 11.8 oz (76.538 kg)  Vital Signs: Temp: 98.2 F (36.8 C) (08/09 0926) Temp Source: Oral (08/09 0926) BP: 121/78 mmHg (08/09 0926) Pulse Rate: 86 (08/09 0926)  Labs:  Recent Labs  03/09/15 1540 03/10/15 0537 03/11/15 1005  HGB 14.2 12.7 12.8  HCT 42.0 38.6 39.8  PLT 546* 490* 469*  LABPROT 21.2*  --  27.0*  INR 1.84*  --  2.54*  CREATININE 3.90* 4.16* 4.04*    Estimated Creatinine Clearance: 13.1 mL/min (by C-G formula based on Cr of 4.04).   Medications:  Warfarin 1.5 mg/day  Assessment: 69 y/o female who presented to the ED with N/V/D and abdominal pain similar to recent admission. She takes warfarin PTA for hx LV thrombus (01/2015). Pharmacy consulted to manage inpatient. INR is therapeutic at 2.54 this morning with significant rise from previous INR. Will reduce dose tonight. No bleeding noted, CBC is stable.  Goal of Therapy:  INR 2-3 Monitor platelets by anticoagulation protocol: Yes   Plan:  -Warfarin 1 mg PO tonight -INR daily -Monitor for s/sx of bleeding  [facility], Pharm.D., BCPS Clinical Pharmacist Pager: [phone] 03/11/2015 11:09 AM

## 2015-03-11 NOTE — Progress Notes (Signed)
          Daily Rounding Note  03/11/2015, 10:21 AM  LOS: 2 days   SUBJECTIVE:       Abdominal pain improved. Currently just feels a little bit sore. Tolerating clears. No nausea. Soft, semi-formed, dark stool this morning.  OBJECTIVE:         Vital signs in last 24 hours:    Temp:  [98 F (36.7 C)-98.7 F (37.1 C)] 98.2 F (36.8 C) (08/09 0926) Pulse Rate:  [72-100] 86 (08/09 0926) Resp:  [16-20] 18 (08/09 0926) BP: (121-144)/(59-90) 121/78 mmHg (08/09 0926) SpO2:  [93 %-100 %] 99 % (08/09 0926) Weight:  [168 lb 11.8 oz (76.538 kg)] 168 lb 11.8 oz (76.538 kg) (08/09 0032) Last BM Date: 03/10/15 Filed Weights   03/10/15 0015 03/11/15 0032  Weight: 169 lb 5 oz (76.8 kg) 168 lb 11.8 oz (76.538 kg)   General: Pleasant, comfortable.  Doesn't look ill.   Heart: RRR. Chest: Clear bilaterally. No cough or dyspnea. Abdomen: Soft, slightly obese, nondistended. Minimal tenderness in the upper abdomen which is bilateral, nonfocal. No guarding or rebound.  Extremities: No CCE. Neuro/Psych:  Alert, pleasant, calm. Oriented 3. No tremor. No gross neurologic deficits.   ASSESMENT:   * Recurrent episode of abdominal pain,n/v and diarrhea.  Unrevealing EGD and colonoscopy in 12/2009. Moderate abdomino-pelvic atherosclerosis on CT scans. Normal GES.   * C. difficile antigen positive but C. difficile toxin negative. This generally represents colonization rather than true C. difficile infection/PMC. Was on Keflex for E coli UTI 8/2 after discharge, no abx currently.  WBCs improved. On Florastor.   Now C diff Ag and toxin negative  * Stage 4 CKD.   * Chronic Coumadin for LA thrombosis, dx 01/2015. Last dose on 8/8.    * Chronic anemia. On daily po iron.    *  Hyponatremia.    PLAN   *  MRI angiography study planned for today.  Rule out mesenteric vascular disease.     Azucena Freed  03/11/2015, 10:21 AM Pager:  [phone]  [facility] Attending  I have also seen and assessed the patient and agree with the advanced practitioner's assessment and plan. Gatha Mayer, MD, [facility] [phone] (pager) 03/11/2015 3:01 PM

## 2015-03-11 NOTE — Progress Notes (Signed)
   Unable to get a mesenteric doppler study here but talked to Dr. Barbie Banner and found out there is an MRI angiography study w/o contrast we can do so I will order. Have not had a chance to explain to patient.  Gatha Mayer, MD, [facility] [phone] (pager) 03/11/2015 8:36 AM

## 2015-03-11 NOTE — Care Management Important Message (Signed)
Important Message  Patient Details  Name: [name] MRN: [id number] Date of Birth: [birth year]   Medicare Important Message Given:  Yes-third notification given    Delorse Lek 03/11/2015, 2:19 PM

## 2015-03-11 NOTE — Progress Notes (Signed)
[facility]  Patient Status: Active (receiving services up to time of hospitalization)  [facility] is providing the following services: RN, PT and OT  If patient discharges after hours, please call [phone].   [name] 03/11/2015, 10:06 AM

## 2015-03-11 NOTE — Care Management Note (Signed)
Case Management Note  Patient Details  Name: [name] MRN: [id number] Date of Birth: 07/26/1946  Subjective/Objective:      CM following for progression and d/c planning.              Action/Plan: 03/11/2015 Notified by [facility] that this pt is active with [facility] for [place] [place] [facility] [place]. CM will follow for ongoing needs.   Expected Discharge Date:        03/14/2015          Expected Discharge Plan:  [place]  In-House Referral:  NA  Discharge planning Services  CM Consult  Post Acute Care Choice:  NA Choice offered to:     DME Arranged:    DME Agency:     HH Arranged:  RN, PT, OT [place] Agency:  [place]  Status of Service:  In process, will continue to follow  Medicare Important Message Given:    Date Medicare IM Given:    Medicare IM give by:    Date Additional Medicare IM Given:    Additional Medicare Important Message give by:     If discussed at [place] of Stay Meetings, dates discussed:    Additional Comments:  Adron Bene, RN 03/11/2015, 10:31 AM

## 2015-03-12 DIAGNOSIS — I10 Essential (primary) hypertension: Secondary | ICD-10-CM

## 2015-03-12 DIAGNOSIS — I2109 ST elevation (STEMI) myocardial infarction involving other coronary artery of anterior wall: Secondary | ICD-10-CM

## 2015-03-12 DIAGNOSIS — I5022 Chronic systolic (congestive) heart failure: Secondary | ICD-10-CM

## 2015-03-12 DIAGNOSIS — N184 Chronic kidney disease, stage 4 (severe): Secondary | ICD-10-CM

## 2015-03-12 DIAGNOSIS — E1122 Type 2 diabetes mellitus with diabetic chronic kidney disease: Secondary | ICD-10-CM

## 2015-03-12 DIAGNOSIS — R109 Unspecified abdominal pain: Secondary | ICD-10-CM

## 2015-03-12 DIAGNOSIS — G43A1 Cyclical vomiting, intractable: Secondary | ICD-10-CM

## 2015-03-12 LAB — CBC
HEMATOCRIT: 38.2 % (ref 36.0–46.0)
HEMOGLOBIN: 12.3 g/dL (ref 12.0–15.0)
MCH: 29 pg (ref 26.0–34.0)
MCHC: 32.2 g/dL (ref 30.0–36.0)
MCV: 90.1 fL (ref 78.0–100.0)
Platelets: 458 10*3/uL — ABNORMAL HIGH (ref 150–400)
RBC: 4.24 MIL/uL (ref 3.87–5.11)
RDW: 17.2 % — ABNORMAL HIGH (ref 11.5–15.5)
WBC: 8 10*3/uL (ref 4.0–10.5)

## 2015-03-12 LAB — COMPREHENSIVE METABOLIC PANEL
ALT: 21 U/L (ref 14–54)
AST: 31 U/L (ref 15–41)
Albumin: 2.3 g/dL — ABNORMAL LOW (ref 3.5–5.0)
Alkaline Phosphatase: 129 U/L — ABNORMAL HIGH (ref 38–126)
Anion gap: 11 (ref 5–15)
BUN: 59 mg/dL — ABNORMAL HIGH (ref 6–20)
CALCIUM: 9.4 mg/dL (ref 8.9–10.3)
CO2: 24 mmol/L (ref 22–32)
Chloride: 102 mmol/L (ref 101–111)
Creatinine, Ser: 3.75 mg/dL — ABNORMAL HIGH (ref 0.44–1.00)
GFR calc Af Amer: 13 mL/min — ABNORMAL LOW (ref >60)
GFR calc non Af Amer: 12 mL/min — ABNORMAL LOW (ref >60)
Glucose, Bld: 116 mg/dL — ABNORMAL HIGH (ref 65–99)
Potassium: 3.6 mmol/L (ref 3.5–5.1)
Sodium: 137 mmol/L (ref 135–145)
TOTAL PROTEIN: 5.3 g/dL — AB (ref 6.5–8.1)
Total Bilirubin: 0.7 mg/dL (ref 0.3–1.2)

## 2015-03-12 LAB — GLUCOSE, CAPILLARY
GLUCOSE-CAPILLARY: 134 mg/dL — AB (ref 65–99)
GLUCOSE-CAPILLARY: 139 mg/dL — AB (ref 65–99)
Glucose-Capillary: 110 mg/dL — ABNORMAL HIGH (ref 65–99)
Glucose-Capillary: 116 mg/dL — ABNORMAL HIGH (ref 65–99)

## 2015-03-12 LAB — PROTIME-INR
INR: 3.16 — AB (ref 0.00–1.49)
PROTHROMBIN TIME: 31.8 s — AB (ref 11.6–15.2)

## 2015-03-12 MED ORDER — DIPHENHYDRAMINE HCL 50 MG/ML IJ SOLN
25.0000 mg | Freq: Once | INTRAMUSCULAR | Status: AC
  Administered 2015-03-12: 25 mg via INTRAVENOUS
  Filled 2015-03-12: qty 1

## 2015-03-12 MED ORDER — PANTOPRAZOLE SODIUM 40 MG PO TBEC
40.0000 mg | DELAYED_RELEASE_TABLET | Freq: Every day | ORAL | Status: DC
  Administered 2015-03-13 – 2015-03-16 (×4): 40 mg via ORAL
  Filled 2015-03-12 (×4): qty 1

## 2015-03-12 NOTE — Progress Notes (Signed)
Warfarin on hold for GI procedures (colonoscopy and EGD) on Friday 8/12.  INR this morning 3.16. Hgb nml, plts elevated. No bleeding noted.  Plan: -hold warfarin -continue daily INR -follow for s/s bleeding -follow for warfarin restart post procedures  Vedanth Sirico D. Lavel Rieman, PharmD, BCPS Clinical Pharmacist Pager: [phone] 03/12/2015 11:48 AM

## 2015-03-12 NOTE — Progress Notes (Addendum)
   Patient Name: Grainne A Stansel Date of Encounter: 03/12/2015, 11:10 AM    Subjective  Still having diarrhea and cramps.  Objective  BP 154/67 mmHg  Pulse 70  Temp(Src) 97.8 F (36.6 C) (Oral)  Resp 17  Wt 170 lb 3.1 oz (77.2 kg)  SpO2 98% Elderly bw NAD - on bedside commode having diarrheal stool  MRA study shows patent celiac, SMA and IMA  Assessment and Plan  Nausea, vomiting Abdominal pain Diarrhea  Cause unclear Does not have major stenosis in mesentery. With her heart disease could still be a steal phenomenon and low flow. Given persistent problems and diagnostic uncertainty I think EGD and colonoscopy appropriate which may not be done until Friday...will see  Will hold warfarin but not expect her to be below 2 INR necessarily.  Gatha Mayer, MD, [facility] [phone] (pager) 03/12/2015 11:10 AM

## 2015-03-12 NOTE — Progress Notes (Signed)
[facility] PROGRESS NOTE  [name] HGD:[id number] DOB: 07-Feb-1946 DOA: 03/09/2015 PCP: Antony Blackbird, MD  Assessment/Plan: 1. Abdominal pain- patient came with abdominal pain with nausea vomiting and diarrhea. Likely from mesenteric ischemia from low flow state. CT abdomen pelvis were negative for acute findings. C. difficile PCR negative. Patient also had gastric empty study on 6-16 which was unremarkable. MRI injury gram showed no evidence of mesenteric arterial occlusive disease. Proximal mesenteric arteries are normally patent. Patient is supposed to go for EGD and colonoscopy as per GI on Friday. 2. Chronic systolic and diastolic CHF- patient appears to be euvolemic. Continue Coreg, patient was discharged on 03/06/2015 on Lasix 80 mg twice a day, Lasix has been on hold in the hospital for worsening renal function. Now the creatinine is back to her baseline 3.75.  3. Acute on chronic kidney disease stage IV- likely cardiorenal syndrome. Creatinine now back to baseline 3.75. Will restart Lasix in a.m if patient blood pressure stable 4. LV thrombus- continue anticoagulation with Coumadin 5. Diabetes mellitus type 2- continue Lantus 20 units daily, sliding scale insulin with NovoLog 6. Hypertension- blood pressure controlled, continue Coreg, BiDil 7. History of pulmonary hypertension-stable, PA pressure 62 mmHg by echo on 01/21/2015  Code Status: Full code Family Communication: No family at bedside Disposition Plan: Home in medically stable   Consultants:  Gastroenterology  Procedures:  None  Antibiotics: None HPI/Subjective: 69 year old female with a history of COPD (2L at home), chronic respiratory failure, chronic systolic and diastolic CHF, CKD stage IV, LV thrombus on anticoagulation, hypertension, diabetes mellitus type 2, sarcoidosis presented with nausea, vomiting, diarrhea, abdominal pain. The patient was recently discharged from the hospital on 03/06/2015 with a  similar presentation. The patient was seen by gastroenterology at that time who felt that this may be due to a low flow state and chronic mesenteric ischemia. At the time of discharge, the patient did not have any abdominal pain and was tolerating her diet. However, the patient developed abdominal pain with nausea and vomiting on 03/09/2015.  Patient says that the abdominal pain is better. No nausea, vomiting.  Objective: Filed Vitals:   03/12/15 0751  BP: 154/67  Pulse: 70  Temp: 97.8 F (36.6 C)  Resp: 17    Intake/Output Summary (Last 24 hours) at 03/12/15 1444 Last data filed at 03/12/15 1418  Gross per 24 hour  Intake    940 ml  Output   1700 ml  Net   -760 ml   Filed Weights   03/10/15 0015 03/11/15 0032 03/12/15 0544  Weight: 76.8 kg (169 lb 5 oz) 76.538 kg (168 lb 11.8 oz) 77.2 kg (170 lb 3.1 oz)    Exam:   General:  Appears in no acute distress  Cardiovascular: S1-S2 regular  Respiratory: Clear to auscultation bilaterally  Abdomen: Soft, nontender, no organomegaly  Musculoskeletal: No edema. Lower extremities   Data Reviewed: Basic Metabolic Panel:  Recent Labs Lab 03/06/15 0601 03/09/15 1540 03/10/15 0537 03/11/15 1005 03/12/15 0517  NA 134* 133* 132* 132* 137  K 4.0 3.7 3.5 3.9 3.6  CL 99* 96* 97* 98* 102  CO2 23 22 21* 23 24  GLUCOSE 165* 173* 129* 165* 116*  BUN 66* 76* 72* 67* 59*  CREATININE 4.67* 3.90* 4.16* 4.04* 3.75*  CALCIUM 9.2 9.8 9.2 9.3 9.4  PHOS  --   --   --  5.1*  --    Liver Function Tests:  Recent Labs Lab 03/09/15 1540 03/11/15 1005 03/12/15 0517  AST 32  --  31  ALT 19  --  21  ALKPHOS 148*  --  129*  BILITOT 0.8  --  0.7  PROT 6.4*  --  5.3*  ALBUMIN 2.8* 2.4* 2.3*    Recent Labs Lab 03/09/15 1540  LIPASE 50   No results for input(s): AMMONIA in the last 168 hours. CBC:  Recent Labs Lab 03/09/15 1540 03/10/15 0537 03/11/15 1005 03/12/15 0517  WBC 12.9* 12.1* 8.7 8.0  NEUTROABS 6.8  --   --   --    HGB 14.2 12.7 12.8 12.3  HCT 42.0 38.6 39.8 38.2  MCV 86.4 87.7 90.0 90.1  PLT 546* 490* 469* 458*   Cardiac Enzymes: No results for input(s): CKTOTAL, CKMB, CKMBINDEX, TROPONINI in the last 168 hours. BNP (last 3 results)  Recent Labs  01/21/15 1830 02/02/15 0517 02/24/15 1612  BNP 1056.8* 2924.8* 475.5*    ProBNP (last 3 results)  Recent Labs  04/26/14 2358 06/24/14 0945  PROBNP 3725.0* 324.0*    CBG:  Recent Labs Lab 03/11/15 1125 03/11/15 1712 03/11/15 2136 03/12/15 0727 03/12/15 1117  GLUCAP 174* 111* 180* 110* 134*    Recent Results (from the past 240 hour(s))  MRSA PCR Screening     Status: None   Collection Time: 03/02/15  7:01 PM  Result Value Ref Range Status   MRSA by PCR NEGATIVE NEGATIVE Final    Comment:        The GeneXpert MRSA Assay (FDA approved for NASAL specimens only), is one component of a comprehensive MRSA colonization surveillance program. It is not intended to diagnose MRSA infection nor to guide or monitor treatment for MRSA infections.   Urine culture     Status: None   Collection Time: 03/04/15 12:35 AM  Result Value Ref Range Status   Specimen Description URINE, RANDOM  Final   Special Requests NONE  Final   Culture >=100,000 COLONIES/mL ESCHERICHIA COLI  Final   Report Status 03/06/2015 FINAL  Final   Organism ID, Bacteria ESCHERICHIA COLI  Final      Susceptibility   Escherichia coli - MIC*    AMPICILLIN <=2 SENSITIVE Sensitive     CEFAZOLIN <=4 SENSITIVE Sensitive     CEFTRIAXONE <=1 SENSITIVE Sensitive     CIPROFLOXACIN <=0.25 SENSITIVE Sensitive     GENTAMICIN <=1 SENSITIVE Sensitive     IMIPENEM <=0.25 SENSITIVE Sensitive     NITROFURANTOIN <=16 SENSITIVE Sensitive     TRIMETH/SULFA <=20 SENSITIVE Sensitive     AMPICILLIN/SULBACTAM <=2 SENSITIVE Sensitive     PIP/TAZO <=4 SENSITIVE Sensitive     * >=100,000 COLONIES/mL ESCHERICHIA COLI  C difficile quick scan w PCR reflex     Status: Abnormal    Collection Time: 03/04/15 12:15 PM  Result Value Ref Range Status   C Diff antigen POSITIVE (A) NEGATIVE Final   C Diff toxin NEGATIVE NEGATIVE Final   C Diff interpretation   Final    C. difficile present, but toxin not detected. This indicates colonization. In most cases, this does not require treatment. If patient has signs and symptoms consistent with colitis, consider treatment.  C difficile quick scan w PCR reflex     Status: None   Collection Time: 03/10/15 12:54 PM  Result Value Ref Range Status   C Diff antigen NEGATIVE NEGATIVE Final   C Diff toxin NEGATIVE NEGATIVE Final   C Diff interpretation Negative for toxigenic C. difficile  Final     Studies: Mr [name] Abdomen  Wo Contrast  03/12/2015   CLINICAL DATA:  Frequent episodes of abdominal pain, nausea, vomiting loose stools. History of stage 4 chronic kidney disease prohibiting use of iodinated contrast and gadolinium with GFR of 12 mL/minute.  EXAM: MRA ABDOMEN WITHOUT CONTRAST  TECHNIQUE: Angiographic images of the chest were obtained using MRA technique without intravenous contrast.  CONTRAST:  None  COMPARISON:  Unenhanced CT of the abdomen and pelvis on 03/09/2015  FINDINGS: Good quality MRA imaging was able to be obtained utilizing flow sensitive sequences. The abdominal aorta is of normal caliber. There is no evidence of significant atherosclerosis or aneurysmal disease of the aorta. Proximal mesenteric arteries are well visualized. The celiac trunk, proximal SMA and proximal IMA show normal patency. Branch vessel evaluation is limited by unenhanced techniques. 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 The common hepatic artery is widely patent.  Proximal segments of the renal arteries are normally patent bilaterally. The preliminary unenhanced imaging shows no obvious abnormalities involving bowel loops. No abnormal fluid collections are seen.  IMPRESSION: Adequate unenhanced MRA of the abdomen  demonstrates no evidence of mesenteric arterial occlusive disease. Proximal mesenteric arteries are normally patent. 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 Bilateral proximal renal artery segments are normally patent.   Electronically Signed   By: Aletta Edouard M.D.   On: 03/12/2015 09:03    Scheduled Meds: . atorvastatin  20 mg Oral QHS  . carvedilol  6.25 mg Oral BID WC  . dicyclomine  10 mg Oral BID  . feeding supplement  1 Container Oral TID BM  . insulin aspart  0-5 Units Subcutaneous QHS  . insulin aspart  0-9 Units Subcutaneous TID WC  . insulin glargine  20 Units Subcutaneous Daily  . multivitamin with minerals  1 tablet Oral Daily  . ondansetron (ZOFRAN) IV  4 mg Intravenous Once  . [START ON 03/13/2015] pantoprazole  40 mg Oral Q0600  . saccharomyces boulardii  250 mg Oral BID  . sodium bicarbonate  650 mg Oral BID   Continuous Infusions:   Principal Problem:   Nausea and vomiting Active Problems:   Essential hypertension   Mural thrombus of cardiac apex   Chronic systolic CHF (congestive heart failure)   Chronic kidney disease (CKD), stage IV (severe)   Thrombocytosis   DM type 2 causing CKD stage 4   COPD (chronic obstructive pulmonary disease)   AP (abdominal pain)   Elevated lactic acid level   Abdominal aortic atherosclerosis   Periumbilical abdominal pain    Time spent: *25 min    [facility] Pager [phone]. If 7PM-7AM, please contact night-coverage at [email or web address], password [facility] 03/12/2015, 2:44 PM  LOS: 3 days

## 2015-03-13 LAB — GLUCOSE, CAPILLARY
Glucose-Capillary: 101 mg/dL — ABNORMAL HIGH (ref 65–99)
Glucose-Capillary: 140 mg/dL — ABNORMAL HIGH (ref 65–99)
Glucose-Capillary: 108 mg/dL — ABNORMAL HIGH (ref 65–99)
Glucose-Capillary: 144 mg/dL — ABNORMAL HIGH (ref 65–99)

## 2015-03-13 LAB — PROTIME-INR
INR: 3.89 — AB (ref 0.00–1.49)
Prothrombin Time: 37.2 seconds — ABNORMAL HIGH (ref 11.6–15.2)

## 2015-03-13 MED ORDER — PEG-KCL-NACL-NASULF-NA ASC-C 100 G PO SOLR
0.5000 | Freq: Once | ORAL | Status: AC
  Administered 2015-03-13: 100 g via ORAL
  Filled 2015-03-13: qty 1

## 2015-03-13 MED ORDER — FUROSEMIDE 80 MG PO TABS
80.0000 mg | ORAL_TABLET | Freq: Two times a day (BID) | ORAL | Status: DC
  Administered 2015-03-13 – 2015-03-16 (×6): 80 mg via ORAL
  Filled 2015-03-13 (×6): qty 1

## 2015-03-13 MED ORDER — PEG-KCL-NACL-NASULF-NA ASC-C 100 G PO SOLR
0.5000 | Freq: Once | ORAL | Status: AC
  Administered 2015-03-14: 100 g via ORAL

## 2015-03-13 MED ORDER — PEG-KCL-NACL-NASULF-NA ASC-C 100 G PO SOLR: 1.0000 | Freq: Once | ORAL | Status: DC

## 2015-03-13 MED ORDER — VITAMIN K1 1 MG/0.5ML IJ SOLN
2.0000 mg | Freq: Once | INTRAMUSCULAR | Status: AC
  Administered 2015-03-13: 2 mg via SUBCUTANEOUS
  Filled 2015-03-13 (×2): qty 1

## 2015-03-13 NOTE — Progress Notes (Signed)
[facility] PROGRESS NOTE  [name] AJO:[id number] DOB: 1946-04-19 DOA: 03/09/2015 PCP: [name] Blackbird, MD  Assessment/Plan: 1. Abdominal pain- patient came with abdominal pain with nausea vomiting and diarrhea. Likely from mesenteric ischemia from low flow state. CT abdomen pelvis were negative for acute findings. C. difficile PCR negative. Patient also had gastric empty study on 6-16 which was unremarkable. MRI injury gram showed no evidence of mesenteric arterial occlusive disease. Proximal mesenteric arteries are normally patent. Patient is supposed to go for EGD and colonoscopy as per GI on Friday. 2. Chronic systolic and diastolic CHF- patient appears to be euvolemic. Continue Coreg, patient was discharged on 03/06/2015 on Lasix 80 mg twice a day, Lasix has been on hold in the hospital for worsening renal function. Now the creatinine is back to her baseline 3.75. Will check bmp in am. 3. Acute on chronic kidney disease stage IV- likely cardiorenal syndrome. Creatinine now back to baseline 3.75. Will restart the lasix 80 mg po bid. 4. LV thrombus- continue anticoagulation with Coumadin 5. Diabetes mellitus type 2- continue Lantus 20 units daily, sliding scale insulin with NovoLog 6. Hypertension- blood pressure controlled, continue Coreg, BiDil 7. History of pulmonary hypertension-stable, PA pressure 62 mmHg by echo on 01/21/2015  Code Status: Full code Family Communication: No family at bedside Disposition Plan: Home in medically stable   Consultants:  Gastroenterology  Procedures:  None  Antibiotics: None HPI/Subjective: 69 year old female with a history of COPD (2L at home), chronic respiratory failure, chronic systolic and diastolic CHF, CKD stage IV, LV thrombus on anticoagulation, hypertension, diabetes mellitus type 2, sarcoidosis presented with nausea, vomiting, diarrhea, abdominal pain. The patient was recently discharged from the hospital on 03/06/2015 with a  similar presentation. The patient was seen by gastroenterology at that time who felt that this may be due to a low flow state and chronic mesenteric ischemia. At the time of discharge, the patient did not have any abdominal pain and was tolerating her diet. However, the patient developed abdominal pain with nausea and vomiting on 03/09/2015.  Patient denies abdominal pain this am, awaiting EGD and colonoscopy tomorrow.  Objective: Filed Vitals:   03/13/15 1020  BP: 133/78  Pulse: 82  Temp: 98 F (36.7 C)  Resp: 17    Intake/Output Summary (Last 24 hours) at 03/13/15 1511 Last data filed at 03/13/15 1502  Gross per 24 hour  Intake   1000 ml  Output   1552 ml  Net   -552 ml   Filed Weights   03/11/15 0032 03/12/15 0544 03/12/15 2024  Weight: 76.538 kg (168 lb 11.8 oz) 77.2 kg (170 lb 3.1 oz) 78.2 kg (172 lb 6.4 oz)    Exam:   General:  Appears in no acute distress  Cardiovascular: S1-S2 regular  Respiratory: Clear to auscultation bilaterally  Abdomen: Soft, nontender, no organomegaly  Musculoskeletal: No edema. Lower extremities   Data Reviewed: Basic Metabolic Panel:  Recent Labs Lab 03/09/15 1540 03/10/15 0537 03/11/15 1005 03/12/15 0517  NA 133* 132* 132* 137  K 3.7 3.5 3.9 3.6  CL 96* 97* 98* 102  CO2 22 21* 23 24  GLUCOSE 173* 129* 165* 116*  BUN 76* 72* 67* 59*  CREATININE 3.90* 4.16* 4.04* 3.75*  CALCIUM 9.8 9.2 9.3 9.4  PHOS  --   --  5.1*  --    Liver Function Tests:  Recent Labs Lab 03/09/15 1540 03/11/15 1005 03/12/15 0517  AST 32  --  31  ALT 19  --  21  ALKPHOS 148*  --  129*  BILITOT 0.8  --  0.7  PROT 6.4*  --  5.3*  ALBUMIN 2.8* 2.4* 2.3*    Recent Labs Lab 03/09/15 1540  LIPASE 50   No results for input(s): AMMONIA in the last 168 hours. CBC:  Recent Labs Lab 03/09/15 1540 03/10/15 0537 03/11/15 1005 03/12/15 0517  WBC 12.9* 12.1* 8.7 8.0  NEUTROABS 6.8  --   --   --   HGB 14.2 12.7 12.8 12.3  HCT 42.0 38.6 39.8  38.2  MCV 86.4 87.7 90.0 90.1  PLT 546* 490* 469* 458*   Cardiac Enzymes: No results for input(s): CKTOTAL, CKMB, CKMBINDEX, TROPONINI in the last 168 hours. BNP (last 3 results)  Recent Labs  01/21/15 1830 02/02/15 0517 02/24/15 1612  BNP 1056.8* 2924.8* 475.5*    ProBNP (last 3 results)  Recent Labs  04/26/14 2358 06/24/14 0945  PROBNP 3725.0* 324.0*    CBG:  Recent Labs Lab 03/12/15 1117 03/12/15 1642 03/12/15 2024 03/13/15 0832 03/13/15 1157  GLUCAP 134* 139* 116* 101* 140*    Recent Results (from the past 240 hour(s))  Urine culture     Status: None   Collection Time: 03/04/15 12:35 AM  Result Value Ref Range Status   Specimen Description URINE, RANDOM  Final   Special Requests NONE  Final   Culture >=100,000 COLONIES/mL ESCHERICHIA COLI  Final   Report Status 03/06/2015 FINAL  Final   Organism ID, Bacteria ESCHERICHIA COLI  Final      Susceptibility   Escherichia coli - MIC*    AMPICILLIN <=2 SENSITIVE Sensitive     CEFAZOLIN <=4 SENSITIVE Sensitive     CEFTRIAXONE <=1 SENSITIVE Sensitive     CIPROFLOXACIN <=0.25 SENSITIVE Sensitive     GENTAMICIN <=1 SENSITIVE Sensitive     IMIPENEM <=0.25 SENSITIVE Sensitive     NITROFURANTOIN <=16 SENSITIVE Sensitive     TRIMETH/SULFA <=20 SENSITIVE Sensitive     AMPICILLIN/SULBACTAM <=2 SENSITIVE Sensitive     PIP/TAZO <=4 SENSITIVE Sensitive     * >=100,000 COLONIES/mL ESCHERICHIA COLI  C difficile quick scan w PCR reflex     Status: Abnormal   Collection Time: 03/04/15 12:15 PM  Result Value Ref Range Status   C Diff antigen POSITIVE (A) NEGATIVE Final   C Diff toxin NEGATIVE NEGATIVE Final   C Diff interpretation   Final    C. difficile present, but toxin not detected. This indicates colonization. In most cases, this does not require treatment. If patient has signs and symptoms consistent with colitis, consider treatment.  C difficile quick scan w PCR reflex     Status: None   Collection Time: 03/10/15  12:54 PM  Result Value Ref Range Status   C Diff antigen NEGATIVE NEGATIVE Final   C Diff toxin NEGATIVE NEGATIVE Final   C Diff interpretation Negative for toxigenic C. difficile  Final     Studies: Mr [name] Abdomen Wo Contrast  03/12/2015   CLINICAL DATA:  Frequent episodes of abdominal pain, nausea, vomiting loose stools. History of stage 4 chronic kidney disease prohibiting use of iodinated contrast and gadolinium with GFR of 12 mL/minute.  EXAM: MRA ABDOMEN WITHOUT CONTRAST  TECHNIQUE: Angiographic images of the chest were obtained using MRA technique without intravenous contrast.  CONTRAST:  None  COMPARISON:  Unenhanced CT of the abdomen and pelvis on 03/09/2015  FINDINGS: Good quality MRA imaging was able to be obtained utilizing flow sensitive sequences. The abdominal aorta is  of normal caliber. There is no evidence of significant atherosclerosis or aneurysmal disease of the aorta. Proximal mesenteric arteries are well visualized. The celiac trunk, proximal SMA and proximal IMA show normal patency. Branch vessel evaluation is limited by unenhanced techniques. 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 The common hepatic artery is widely patent.  Proximal segments of the renal arteries are normally patent bilaterally. The preliminary unenhanced imaging shows no obvious abnormalities involving bowel loops. No abnormal fluid collections are seen.  IMPRESSION: Adequate unenhanced MRA of the abdomen demonstrates no evidence of mesenteric arterial occlusive disease. Proximal mesenteric arteries are normally patent. 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 Bilateral proximal renal artery segments are normally patent.   Electronically Signed   By: Aletta Edouard M.D.   On: 03/12/2015 09:03    Scheduled Meds: . atorvastatin  20 mg Oral QHS  . carvedilol  6.25 mg Oral BID WC  . dicyclomine  10 mg Oral BID  . feeding supplement  1 Container Oral TID BM  .  insulin aspart  0-5 Units Subcutaneous QHS  . insulin aspart  0-9 Units Subcutaneous TID WC  . insulin glargine  20 Units Subcutaneous Daily  . multivitamin with minerals  1 tablet Oral Daily  . ondansetron (ZOFRAN) IV  4 mg Intravenous Once  . pantoprazole  40 mg Oral Q0600  . peg 3350 powder  0.5 kit Oral Once   And  . [START ON 03/14/2015] peg 3350 powder  0.5 kit Oral Once  . saccharomyces boulardii  250 mg Oral BID  . sodium bicarbonate  650 mg Oral BID   Continuous Infusions:   Principal Problem:   Nausea and vomiting Active Problems:   Essential hypertension   Mural thrombus of cardiac apex   Chronic systolic CHF (congestive heart failure)   Chronic kidney disease (CKD), stage IV (severe)   Thrombocytosis   DM type 2 causing CKD stage 4   COPD (chronic obstructive pulmonary disease)   AP (abdominal pain)   Elevated lactic acid level   Abdominal aortic atherosclerosis   Periumbilical abdominal pain    Time spent: *25 min    [facility] Pager [phone]. If 7PM-7AM, please contact night-coverage at [email or web address], password [facility] 03/13/2015, 3:11 PM  LOS: 4 days

## 2015-03-13 NOTE — Progress Notes (Signed)
   Lab Results  Component Value Date   INR 3.89* 03/13/2015   INR 3.16* 03/12/2015   INR 2.54* 03/11/2015    I have rxed 2 mg vit K SQ Gatha Mayer, MD, [facility] [phone] (pager) 03/13/2015 5:26 PM

## 2015-03-14 ENCOUNTER — Encounter (HOSPITAL_COMMUNITY)
Admission: EM | Disposition: A | Discharge status: Home / Self Care | Payer: Self-pay | Source: Home / Self Care | Attending: Family Medicine

## 2015-03-14 ENCOUNTER — Encounter (HOSPITAL_COMMUNITY): Payer: Self-pay | Admitting: Internal Medicine

## 2015-03-14 ENCOUNTER — Ambulatory Visit: Payer: Medicare Other | Admitting: Pulmonary Disease

## 2015-03-14 ENCOUNTER — Inpatient Hospital Stay (HOSPITAL_COMMUNITY): Payer: Medicare Other | Admitting: Anesthesiology

## 2015-03-14 DIAGNOSIS — D125 Benign neoplasm of sigmoid colon: Secondary | ICD-10-CM

## 2015-03-14 HISTORY — PX: ESOPHAGOGASTRODUODENOSCOPY (EGD) WITH PROPOFOL: SHX5813

## 2015-03-14 HISTORY — PX: COLONOSCOPY WITH PROPOFOL: SHX5780

## 2015-03-14 LAB — BASIC METABOLIC PANEL
ANION GAP: 11 (ref 5–15)
BUN: 37 mg/dL — ABNORMAL HIGH (ref 6–20)
CO2: 24 mmol/L (ref 22–32)
Calcium: 9.8 mg/dL (ref 8.9–10.3)
Chloride: 106 mmol/L (ref 101–111)
Creatinine, Ser: 3.17 mg/dL — ABNORMAL HIGH (ref 0.44–1.00)
GFR, EST AFRICAN AMERICAN: 16 mL/min — AB (ref >60)
GFR, EST NON AFRICAN AMERICAN: 14 mL/min — AB (ref >60)
Glucose, Bld: 50 mg/dL — ABNORMAL LOW (ref 65–99)
Potassium: 3.4 mmol/L — ABNORMAL LOW (ref 3.5–5.1)
SODIUM: 141 mmol/L (ref 135–145)

## 2015-03-14 LAB — PROTIME-INR
INR: 3.99 — AB (ref 0.00–1.49)
INR: 2.15 — AB (ref 0.00–1.49)
PROTHROMBIN TIME: 23.8 s — AB (ref 11.6–15.2)
Prothrombin Time: 37.9 seconds — ABNORMAL HIGH (ref 11.6–15.2)

## 2015-03-14 LAB — GLUCOSE, CAPILLARY
GLUCOSE-CAPILLARY: 96 mg/dL (ref 65–99)
GLUCOSE-CAPILLARY: 93 mg/dL (ref 65–99)
GLUCOSE-CAPILLARY: 183 mg/dL — AB (ref 65–99)
Glucose-Capillary: 96 mg/dL (ref 65–99)
Glucose-Capillary: 110 mg/dL — ABNORMAL HIGH (ref 65–99)

## 2015-03-14 LAB — ABO/RH: ABO/RH(D): O POS

## 2015-03-14 SURGERY — ESOPHAGOGASTRODUODENOSCOPY (EGD) WITH PROPOFOL
Anesthesia: Monitor Anesthesia Care

## 2015-03-14 MED ORDER — LACTATED RINGERS IV SOLN
INTRAVENOUS | Status: DC | PRN
  Administered 2015-03-14: 11:00:00 via INTRAVENOUS

## 2015-03-14 MED ORDER — DIPHENHYDRAMINE HCL 50 MG/ML IJ SOLN
INTRAMUSCULAR | Status: DC | PRN
  Administered 2015-03-14: 12.5 mg via INTRAVENOUS

## 2015-03-14 MED ORDER — LOPERAMIDE HCL 2 MG PO CAPS
4.0000 mg | ORAL_CAPSULE | Freq: Every day | ORAL | Status: DC
  Administered 2015-03-15: 4 mg via ORAL
  Filled 2015-03-14 (×2): qty 2

## 2015-03-14 MED ORDER — BUTAMBEN-TETRACAINE-BENZOCAINE 2-2-14 % EX AERO
INHALATION_SPRAY | CUTANEOUS | Status: DC | PRN
  Administered 2015-03-14: 1 via TOPICAL

## 2015-03-14 MED ORDER — DIPHENHYDRAMINE HCL 25 MG PO CAPS
25.0000 mg | ORAL_CAPSULE | Freq: Once | ORAL | Status: AC
  Administered 2015-03-14: 25 mg via ORAL
  Filled 2015-03-14: qty 1

## 2015-03-14 MED ORDER — PROPOFOL INFUSION 10 MG/ML OPTIME
INTRAVENOUS | Status: DC | PRN
  Administered 2015-03-14: 150 ug/kg/min via INTRAVENOUS

## 2015-03-14 MED ORDER — POTASSIUM CHLORIDE CRYS ER 20 MEQ PO TBCR
40.0000 meq | EXTENDED_RELEASE_TABLET | Freq: Once | ORAL | Status: AC
  Administered 2015-03-14: 40 meq via ORAL
  Filled 2015-03-14: qty 2

## 2015-03-14 MED ORDER — PRO-STAT SUGAR FREE PO LIQD
30.0000 mL | Freq: Every day | ORAL | Status: DC
  Administered 2015-03-14 – 2015-03-16 (×2): 30 mL via ORAL
  Filled 2015-03-14 (×3): qty 30

## 2015-03-14 MED ORDER — DICYCLOMINE HCL 20 MG PO TABS
20.0000 mg | ORAL_TABLET | Freq: Three times a day (TID) | ORAL | Status: DC
  Administered 2015-03-14 – 2015-03-16 (×5): 20 mg via ORAL
  Filled 2015-03-14 (×5): qty 1

## 2015-03-14 MED ORDER — SODIUM CHLORIDE 0.9 % IV SOLN: Freq: Once | INTRAVENOUS | Status: DC

## 2015-03-14 NOTE — Transfer of Care (Signed)
Immediate Anesthesia Transfer of Care Note  Patient: [place]  Procedure(s) Performed: Procedure(s): ESOPHAGOGASTRODUODENOSCOPY (EGD) WITH PROPOFOL (N/A) COLONOSCOPY WITH PROPOFOL (N/A)  Patient Location: Endoscopy Unit  Anesthesia Type:MAC  Level of Consciousness: awake, alert  and oriented  Airway & Oxygen Therapy: Patient Spontanous Breathing  Post-op Assessment: Report given to RN and Post -op Vital signs reviewed and stable  Post vital signs: Reviewed and stable  Last Vitals:  Filed Vitals:   03/14/15 1042  BP: 205/73  Pulse: 72  Temp: 36.9 C  Resp: 22    Complications: No apparent anesthesia complications

## 2015-03-14 NOTE — Progress Notes (Signed)
Glucose checked with a result of 96.

## 2015-03-14 NOTE — Anesthesia Preprocedure Evaluation (Addendum)
Anesthesia Evaluation  Patient identified by MRN, date of birth, ID band Patient awake    Reviewed: Allergy & Precautions, NPO status , Patient's Chart, lab work & pertinent test results  Airway Mallampati: I  TM Distance: >3 FB Neck ROM: Full    Dental  (+) Partial Upper   Pulmonary sleep apnea , COPD breath sounds clear to auscultation        Cardiovascular Exercise Tolerance: Poor hypertension, Pt. on medications + CAD, + Past MI, + Peripheral Vascular Disease and +CHF + Valvular Problems/Murmurs Rhythm:Regular Rate:Normal     Neuro/Psych  Headaches, PSYCHIATRIC DISORDERS Anxiety    GI/Hepatic Neg liver ROS, GERD-  Medicated,  Endo/Other  diabetes, Type 2  Renal/GU CRFRenal disease  negative genitourinary   Musculoskeletal  (+) Arthritis -,   Abdominal   Peds negative pediatric ROS (+)  Hematology negative hematology ROS (+)   Anesthesia Other Findings   Reproductive/Obstetrics negative OB ROS                            Lab Results  Component Value Date   WBC 8.0 03/12/2015   HGB 12.3 03/12/2015   HCT 38.2 03/12/2015   MCV 90.1 03/12/2015   PLT 458* 03/12/2015   Lab Results  Component Value Date   CREATININE 3.17* 03/14/2015   BUN 37* 03/14/2015   NA 141 03/14/2015   K 3.4* 03/14/2015   CL 106 03/14/2015   CO2 24 03/14/2015   Lab Results  Component Value Date   INR 3.99* 03/14/2015   INR 3.89* 03/13/2015   INR 3.16* 03/12/2015   EKG: normal sinus rhythm, RBBB.  Echo (2016)   1. Left ventricle: The cavity size was normal. Systolic function was moderately to severely reduced. The estimated ejection fraction was in the range of 30% to 35%. Wall motion was normal; there were no regional wall motion abnormalities. Doppler parameters are consistent with abnormal left ventricular relaxation (grade 1 diastolic dysfunction). Acoustic contrast opacification  revealed a medium-sized, 2.2 cm (L) x 1.2 cm (W), apicalthrombusassociated with a hypokinetic segment. - 2. Mitral valve: There was mild regurgitation. - 3. Right ventricle: Systolic function was moderately reduced. - 4. Pulmonary arteries: Systolic pressure was moderately increased. PA peak pressure: 62 mm Hg (S). - 5. Pericardium, extracardiac: A trivial pericardial effusion was identified posterior to the heart.  Anesthesia Physical Anesthesia Plan  ASA: III  Anesthesia Plan: MAC   Post-op Pain Management:    Induction: Intravenous  Airway Management Planned: Natural Airway  Additional Equipment:   Intra-op Plan:   Post-operative Plan:   Informed Consent: I have reviewed the patients History and Physical, chart, labs and discussed the procedure including the risks, benefits and alternatives for the proposed anesthesia with the patient or authorized representative who has indicated his/her understanding and acceptance.     Plan Discussed with: CRNA  Anesthesia Plan Comments:         Anesthesia Quick Evaluation

## 2015-03-14 NOTE — Care Management Important Message (Signed)
Important Message  Patient Details  Name: [name] MRN: [id number] Date of Birth: [birth year]   Medicare Important Message Given:  Yes-fourth notification given    Delorse Lek 03/14/2015, 3:10 PM

## 2015-03-14 NOTE — Progress Notes (Signed)
   INR is still high 1 U FFP to be given Explained to patient and she consents.  EGD Colonoscopy at 1100

## 2015-03-14 NOTE — Anesthesia Postprocedure Evaluation (Signed)
  Anesthesia Post-op Note  Patient: [place]  Procedure(s) Performed: Procedure(s): ESOPHAGOGASTRODUODENOSCOPY (EGD) WITH PROPOFOL (N/A) COLONOSCOPY WITH PROPOFOL (N/A)  Patient Location: Endoscopy Unit  Anesthesia Type:MAC  Level of Consciousness: awake and alert   Airway and Oxygen Therapy: Patient Spontanous Breathing  Post-op Pain: none  Post-op Assessment: Post-op Vital signs reviewed and Patient's Cardiovascular Status Stable              Post-op Vital Signs: Reviewed and stable  Last Vitals:  Filed Vitals:   03/14/15 1200  BP: 193/73  Pulse: 68  Temp:   Resp: 28    Complications: No apparent anesthesia complications

## 2015-03-14 NOTE — Progress Notes (Signed)
Nutrition Follow-up  DOCUMENTATION CODES:   Obesity unspecified  INTERVENTION:   Discontinue Boost Breeze.  Provide 30 ml Prostat po once daily, each supplement provides 100 kcal and 15 grams of protein.   Encourage adequate PO intake.   NUTRITION DIAGNOSIS:   Inadequate oral intake related to nausea, vomiting as evidenced by meal completion < 25%; improving  GOAL:   Patient will meet greater than or equal to 90% of their needs; progressing  MONITOR:   PO intake, Supplement acceptance, Diet advancement, Weight trends, Labs, I & O's  REASON FOR ASSESSMENT:   Malnutrition Screening Tool    ASSESSMENT:   69 y.o. female with a history of O2 Dependent COPD (at 2 liters), Chronic combined Systolic and Diastolic CHF, Stage IV CKD, Left Ventricular Thrombus on Coumadin Rx and Chronic Nausea and vomiting and ABD pain who presents to the ED with complaints of increased nausea and vomiting and diffuse ABD pain 7-8/10 cramping in quality and without radiation. Pt s/p EGD colonoscopy.  Diet has been advanced to a renal/carb mod diet. Previous meal completion on a clear liquids diet has been 100%. RD to discontinue Boost Breeze due to poor tolerance and refusal. Pt reports milk like products such as Ensure or Nepro cause abdominal discomfort. RD to order Prostat to aid in protein needs.   Labs and medications reviewed.   Diet Order:  Diet renal/carb modified with fluid restriction Diet-HS Snack?: Nothing; Room service appropriate?: Yes; Fluid consistency:: Thin  Skin:  Reviewed, no issues  Last BM:  8/11  Height:   Ht Readings from Last 1 Encounters:  03/02/15 5\' 2"  (1.575 m)    Weight:   Wt Readings from Last 1 Encounters:  03/13/15 174 lb 9.7 oz (79.2 kg)    Ideal Body Weight:  50 kg  BMI:  Body mass index is 31.93 kg/(m^2).  Estimated Nutritional Needs:   Kcal:  1800-2000  Protein:  80-95 grams  Fluid:  1.8 - 2 L/day  EDUCATION NEEDS:   No education needs  identified at this time  Corrin Parker, MS, RD, LDN Pager # [phone] After hours/ weekend pager # [phone]

## 2015-03-14 NOTE — Progress Notes (Addendum)
[facility] PROGRESS NOTE  [name] ION:[id number] DOB: [birth year] DOA: 03/09/2015 PCP: Antony Blackbird, MD  Assessment/Plan:  1. Abdominal pain- patient came with abdominal pain with nausea vomiting and diarrhea. Likely from mesenteric ischemia from low flow state. CT abdomen pelvis were negative for acute findings. C. difficile PCR negative. Patient also had gastric empty study on 6-16 which was unremarkable. MRI injury gram showed no evidence of mesenteric arterial occlusive disease. Proximal mesenteric arteries are normally patent. Patient underwent EGD and colonoscopy. EGD showed no significant abnormality. Colonoscopy showed polyps and mild diverticulosis. Patient was started on Imodium and Bentyl per GI 2. CHF- patient appears to be euvolemic. Continue Coreg, patient was discharged on 03/06/2015 on Lasix 80 mg twice a day, Lasix has been on hold in the hospital for worsening renal function. Now the creatinine is back to her baseline 3.75. Will check bmp in am. 3. Acute on chronic kidney disease stage IV- likely cardiorenal syndrome. Creatinine now back to baseline 3.75.  Restarted lasix 80 mg po bid. 4. LV thrombus- continue anticoagulation with Coumadin. 5. Hypokalemia- replace potassium and check BMP in a.m. 6. Diabetes mellitus type 2- continue Lantus 20 units daily, sliding scale insulin with NovoLog 7. Hypertension- blood pressure controlled, continue Coreg, BiDil 8. History of pulmonary hypertension-stable, PA pressure 62 mmHg by echo on 01/21/2015  Code Status: Full code Family Communication: No family at bedside Disposition Plan: Home when medically stable   Consultants:  Gastroenterology  Procedures:  None  Antibiotics: None HPI/Subjective: 69 year old female with a history of COPD (2L at home), chronic respiratory failure, chronic systolic and diastolic CHF, CKD stage IV, LV thrombus on anticoagulation, hypertension, diabetes mellitus type 2, sarcoidosis  presented with nausea, vomiting, diarrhea, abdominal pain. The patient was recently discharged from the hospital on 03/06/2015 with a similar presentation. The patient was seen by gastroenterology at that time who felt that this may be due to a low flow state and chronic mesenteric ischemia. At the time of discharge, the patient did not have any abdominal pain and was tolerating her diet. However, the patient developed abdominal pain with nausea and vomiting on 03/09/2015.  Patient denies abdominal pain this am, awaiting EGD and colonoscopy today.  Objective: Filed Vitals:   03/14/15 1200  BP: 193/73  Pulse: 68  Temp:   Resp: 28    Intake/Output Summary (Last 24 hours) at 03/14/15 1440 Last data filed at 03/14/15 1258  Gross per 24 hour  Intake   3223 ml  Output   1000 ml  Net   2223 ml   Filed Weights   03/12/15 0544 03/12/15 2024 03/13/15 2009  Weight: 77.2 kg (170 lb 3.1 oz) 78.2 kg (172 lb 6.4 oz) 79.2 kg (174 lb 9.7 oz)    Exam:   General:  Appears in no acute distress  Cardiovascular: S1-S2 regular  Respiratory: Clear to auscultation bilaterally  Abdomen: Soft, nontender, no organomegaly  Musculoskeletal: No edema. Lower extremities   Data Reviewed: Basic Metabolic Panel:  Recent Labs Lab 03/09/15 1540 03/10/15 0537 03/11/15 1005 03/12/15 0517 03/14/15 0413  NA 133* 132* 132* 137 141  K 3.7 3.5 3.9 3.6 3.4*  CL 96* 97* 98* 102 106  CO2 22 21* 23 24 24   GLUCOSE 173* 129* 165* 116* 50*  BUN 76* 72* 67* 59* 37*  CREATININE 3.90* 4.16* 4.04* 3.75* 3.17*  CALCIUM 9.8 9.2 9.3 9.4 9.8  PHOS  --   --  5.1*  --   --  Liver Function Tests:  Recent Labs Lab 03/09/15 1540 03/11/15 1005 03/12/15 0517  AST 32  --  31  ALT 19  --  21  ALKPHOS 148*  --  129*  BILITOT 0.8  --  0.7  PROT 6.4*  --  5.3*  ALBUMIN 2.8* 2.4* 2.3*    Recent Labs Lab 03/09/15 1540  LIPASE 50   No results for input(s): AMMONIA in the last 168 hours. CBC:  Recent  Labs Lab 03/09/15 1540 03/10/15 0537 03/11/15 1005 03/12/15 0517  WBC 12.9* 12.1* 8.7 8.0  NEUTROABS 6.8  --   --   --   HGB 14.2 12.7 12.8 12.3  HCT 42.0 38.6 39.8 38.2  MCV 86.4 87.7 90.0 90.1  PLT 546* 490* 469* 458*   Cardiac Enzymes: No results for input(s): CKTOTAL, CKMB, CKMBINDEX, TROPONINI in the last 168 hours. BNP (last 3 results)  Recent Labs  01/21/15 1830 02/02/15 0517 02/24/15 1612  BNP 1056.8* 2924.8* 475.5*    ProBNP (last 3 results)  Recent Labs  04/26/14 2358 06/24/14 0945  PROBNP 3725.0* 324.0*    CBG:  Recent Labs Lab 03/13/15 1646 03/13/15 2007 03/14/15 0752 03/14/15 1034 03/14/15 1257  GLUCAP 108* 144* 96 96 93    Recent Results (from the past 240 hour(s))  C difficile quick scan w PCR reflex     Status: None   Collection Time: 03/10/15 12:54 PM  Result Value Ref Range Status   C Diff antigen NEGATIVE NEGATIVE Final   C Diff toxin NEGATIVE NEGATIVE Final   C Diff interpretation Negative for toxigenic C. difficile  Final     Studies: No results found.  Scheduled Meds: . sodium chloride   Intravenous Once  . atorvastatin  20 mg Oral QHS  . carvedilol  6.25 mg Oral BID WC  . dicyclomine  20 mg Oral TID AC  . feeding supplement  1 Container Oral TID BM  . furosemide  80 mg Oral BID  . insulin aspart  0-5 Units Subcutaneous QHS  . insulin aspart  0-9 Units Subcutaneous TID WC  . insulin glargine  20 Units Subcutaneous Daily  . [START ON 03/15/2015] loperamide  4 mg Oral Daily  . multivitamin with minerals  1 tablet Oral Daily  . ondansetron (ZOFRAN) IV  4 mg Intravenous Once  . pantoprazole  40 mg Oral Q0600  . saccharomyces boulardii  250 mg Oral BID  . sodium bicarbonate  650 mg Oral BID   Continuous Infusions:   Principal Problem:   Nausea and vomiting Active Problems:   Essential hypertension   Mural thrombus of cardiac apex   Chronic systolic CHF (congestive heart failure)   Chronic kidney disease (CKD), stage IV  (severe)   Thrombocytosis   DM type 2 causing CKD stage 4   COPD (chronic obstructive pulmonary disease)   AP (abdominal pain)   Elevated lactic acid level   Abdominal aortic atherosclerosis   Periumbilical abdominal pain   Benign neoplasm of sigmoid colon    Time spent: 25 min    [facility] Pager [phone]. If 7PM-7AM, please contact night-coverage at [email or web address], password [facility] 03/14/2015, 2:40 PM  LOS: 5 days

## 2015-03-14 NOTE — Op Note (Signed)
[facility] [place], [address]   ENDOSCOPY PROCEDURE REPORT  PATIENT: Chandani, Rogowski  MR#: [id number] BIRTHDATE: 1946/07/24 , 29  yrs. old GENDER: female ENDOSCOPIST: Gatha Mayer, MD, [facility] PROCEDURE DATE:  03/14/2015 PROCEDURE:  EGD, diagnostic ASA CLASS:     Class III INDICATIONS:  nausea and vomiting, periumbilical abdominal pain. MEDICATIONS: Per Anesthesia and Monitored anesthesia care TOPICAL ANESTHETIC: Cetacaine Spray  DESCRIPTION OF PROCEDURE: After the risks benefits and alternatives of the procedure were thoroughly explained, informed consent was obtained.  The Pentax Gastroscope [id number] endoscope was introduced through the mouth and advanced to the second portion of the duodenum , Without limitations.  The instrument was slowly withdrawn as the mucosa was fully examined.      EXAM: The esophagus and gastroesophageal junction were completely normal in appearance.  The stomach was entered and closely examined.The antrum, angularis, and lesser curvature were well visualized, including a retroflexed view of the cardia and fundus. The stomach wall was normally distensable.  The scope passed easily through the pylorus into the duodenum.  Retroflexed views revealed no abnormalities.     The scope was then withdrawn from the patient and the procedure completed.  COMPLICATIONS: There were no immediate complications.  ENDOSCOPIC IMPRESSION: Normal appearing esophagus and GE junction, the stomach was well visualized and normal in appearance, normal appearing duodenum  RECOMMENDATIONS: Proceed with a Colonoscopy.    eSigned:  Gatha Mayer, MD, [facility] 03/14/2015 11:44 AM

## 2015-03-14 NOTE — Op Note (Signed)
[facility] [place], [address]   COLONOSCOPY PROCEDURE REPORT  PATIENT: [name], [name]  MR#: [id number] BIRTHDATE: Jun 19, 1946 , 41  yrs. old GENDER: female ENDOSCOPIST: Gatha Mayer, MD, [facility] PROCEDURE DATE:  03/14/2015 PROCEDURE:   Colonoscopy, diagnostic and Colonoscopy with biopsy  ASA CLASS:   Class III INDICATIONS:Clinically significant diarrhea of unexplained origin and Patient is not applicable for Colorectal Neoplasm Risk Assessment for this procedure. MEDICATIONS: Residual sedation present, Per Anesthesia , and Monitored anesthesia care  DESCRIPTION OF PROCEDURE:   After the risks benefits and alternatives of the procedure were thoroughly explained, informed consent was obtained.  The digital rectal exam revealed no abnormalities of the rectum.   The Pentax Adult Colon [id number] endoscope was introduced through the anus and advanced to the terminal ileum which was intubated for a short distance. No adverse events experienced.   The quality of the prep was excellent. (MoviPrep was used)  The instrument was then slowly withdrawn as the colon was fully examined. Estimated blood loss is zero unless otherwise noted in this procedure report.  COLON FINDINGS: 1) Four proximal sigmoid polyps - 12-15 mm and 10 mm pedunculated and 2 diminutive sessile polyps.  Not removed given anti-coagulated status.  2) Mild sigmoid diverticulosis.  3) Otherwise normal colon and rectum and terminal ileum.  Random colon biopsies taken.  Retroflexed views revealed no abnormalities. The time to cecum = 5.6 Withdrawal time = 8.7   The scope was withdrawn and the procedure completed. COMPLICATIONS: There were no immediate complications.  ENDOSCOPIC IMPRESSION: 1) Four proximal sigmoid polyps - 12-15 mm and 10 mm pedunculated and 2 diminutive sessile polyps.  Not removed given anti-coagulated status. 2) Mild sigmoid diverticulosis. 3) Otherwise  normal colon and rectum and terminal ileum.  Random colon biopsies taken  RECOMMENDATIONS: Check INR Await bxs - if negative would not do further GI w/u - though no mesenteric stenosis could have small vessel dz or low flow from CHF causing ischemia and sxs Start loperamide 4 mg q AM and dicyclomine 20 mg AC (was on 10 bid) Consider future flex sig and polypectomy but comorbidities may preclude the need renal + Carb modified diet  eSigned:  Gatha Mayer, MD, [facility] 03/14/2015 11:51 AM

## 2015-03-15 DIAGNOSIS — I509 Heart failure, unspecified: Secondary | ICD-10-CM

## 2015-03-15 DIAGNOSIS — R1110 Vomiting, unspecified: Secondary | ICD-10-CM

## 2015-03-15 DIAGNOSIS — E1129 Type 2 diabetes mellitus with other diabetic kidney complication: Secondary | ICD-10-CM

## 2015-03-15 LAB — PROTIME-INR
INR: 1.85 — ABNORMAL HIGH (ref 0.00–1.49)
Prothrombin Time: 21.3 seconds — ABNORMAL HIGH (ref 11.6–15.2)

## 2015-03-15 LAB — CBC
HEMATOCRIT: 37.9 % (ref 36.0–46.0)
HEMOGLOBIN: 12 g/dL (ref 12.0–15.0)
MCH: 28.3 pg (ref 26.0–34.0)
MCHC: 31.7 g/dL (ref 30.0–36.0)
MCV: 89.4 fL (ref 78.0–100.0)
Platelets: 439 10*3/uL — ABNORMAL HIGH (ref 150–400)
RBC: 4.24 MIL/uL (ref 3.87–5.11)
RDW: 17.6 % — ABNORMAL HIGH (ref 11.5–15.5)
WBC: 9.8 10*3/uL (ref 4.0–10.5)

## 2015-03-15 LAB — PREPARE FRESH FROZEN PLASMA: UNIT DIVISION: 0

## 2015-03-15 LAB — BASIC METABOLIC PANEL
Anion gap: 6 (ref 5–15)
BUN: 33 mg/dL — ABNORMAL HIGH (ref 6–20)
CALCIUM: 9.2 mg/dL (ref 8.9–10.3)
CO2: 24 mmol/L (ref 22–32)
CREATININE: 3.45 mg/dL — AB (ref 0.44–1.00)
Chloride: 107 mmol/L (ref 101–111)
GFR calc Af Amer: 15 mL/min — ABNORMAL LOW (ref >60)
GFR calc non Af Amer: 13 mL/min — ABNORMAL LOW (ref >60)
Glucose, Bld: 90 mg/dL (ref 65–99)
POTASSIUM: 3.9 mmol/L (ref 3.5–5.1)
Sodium: 137 mmol/L (ref 135–145)

## 2015-03-15 LAB — GLUCOSE, CAPILLARY
GLUCOSE-CAPILLARY: 105 mg/dL — AB (ref 65–99)
Glucose-Capillary: 175 mg/dL — ABNORMAL HIGH (ref 65–99)
Glucose-Capillary: 60 mg/dL — ABNORMAL LOW (ref 65–99)
Glucose-Capillary: 76 mg/dL (ref 65–99)
Glucose-Capillary: 134 mg/dL — ABNORMAL HIGH (ref 65–99)

## 2015-03-15 MED ORDER — WARFARIN SODIUM 1 MG PO TABS
1.5000 mg | ORAL_TABLET | Freq: Once | ORAL | Status: AC
  Administered 2015-03-15: 1.5 mg via ORAL
  Filled 2015-03-15: qty 1

## 2015-03-15 MED ORDER — WARFARIN - PHARMACIST DOSING INPATIENT: Freq: Every day | Status: DC

## 2015-03-15 NOTE — Progress Notes (Signed)
Subjective: ABM pain better.  She only needs to use the pain medication 1-2 times per day.  Objective: Vital signs in last 24 hours: Temp:  [97.9 F (36.6 C)-98.5 F (36.9 C)] 97.9 F (36.6 C) (08/13 0814) Pulse Rate:  [60-107] 79 (08/13 0814) Resp:  [16-28] 18 (08/13 0814) BP: (136-193)/(67-129) 146/71 mmHg (08/13 0814) SpO2:  [97 %-100 %] 99 % (08/13 0814) Weight:  [81.692 kg (180 lb 1.6 oz)] 81.692 kg (180 lb 1.6 oz) (08/13 0126) Last BM Date: 03/14/15  Intake/Output from previous day: 08/12 0701 - 08/13 0700 In: 1473 [P.O.:780; I.V.:470; Blood:223] Out: 901 [Urine:900; Stool:1] Intake/Output this shift: Total I/O In: 120 [P.O.:120] Out: 500 [Urine:500]  General appearance: alert and no distress GI: periumbilical abdominal pain  Lab Results:  Recent Labs  03/15/15 0429  WBC 9.8  HGB 12.0  HCT 37.9  PLT 439*   BMET  Recent Labs  03/14/15 0413 03/15/15 0429  NA 141 137  K 3.4* 3.9  CL 106 107  CO2 24 24  GLUCOSE 50* 90  BUN 37* 33*  CREATININE 3.17* 3.45*  CALCIUM 9.8 9.2   LFT No results for input(s): PROT, ALBUMIN, AST, ALT, ALKPHOS, BILITOT, BILIDIR, IBILI in the last 72 hours. PT/INR  Recent Labs  03/14/15 1847 03/15/15 0429  LABPROT 23.8* 21.3*  INR 2.15* 1.85*   Hepatitis Panel No results for input(s): HEPBSAG, HCVAB, HEPAIGM, HEPBIGM in the last 72 hours. C-Diff No results for input(s): CDIFFTOX in the last 72 hours. Fecal Lactopherrin No results for input(s): FECLLACTOFRN in the last 72 hours.  Studies/Results: No results found.  Medications:  Scheduled: . sodium chloride   Intravenous Once  . atorvastatin  20 mg Oral QHS  . carvedilol  6.25 mg Oral BID WC  . dicyclomine  20 mg Oral TID AC  . feeding supplement (PRO-STAT SUGAR FREE 64)  30 mL Oral Daily  . furosemide  80 mg Oral BID  . insulin aspart  0-5 Units Subcutaneous QHS  . insulin aspart  0-9 Units Subcutaneous TID WC  . insulin glargine  20 Units Subcutaneous Daily   . loperamide  4 mg Oral Daily  . multivitamin with minerals  1 tablet Oral Daily  . ondansetron (ZOFRAN) IV  4 mg Intravenous Once  . pantoprazole  40 mg Oral Q0600  . saccharomyces boulardii  250 mg Oral BID  . sodium bicarbonate  650 mg Oral BID   Continuous:   Assessment/Plan: 1) Periumbilical pain. 2) ESRD.   The current work up for any overt source of pain.  Her vasculature is normal.  Her pain appears to have improved also.  She could potentially be discharged tomorrow if she remains in her current clinical state.  Plan: 1) Follow up biopsies. 2) Continue with pain medications. 3) ? D/C home tomorrow.   LOS: 6 days   Maley Venezia D 03/15/2015, 11:00 AM

## 2015-03-15 NOTE — Progress Notes (Signed)
ANTICOAGULATION CONSULT NOTE - Follow Up Consult  Pharmacy Consult for coumadin Indication: lv thrombus  Allergies  Allergen Reactions  . Adhesive [Tape] Other (See Comments)    Blisters   . Avelox [Moxifloxacin Hcl In Nacl] Other (See Comments)    GI upset  . Codeine Other (See Comments)    "crazy"   . Guaifenesin Nausea And Vomiting    Takes Mucinex at home without issue  . Latex Swelling  . Oxycodone Nausea And Vomiting    Takes Percocet at home without issue    Patient Measurements: Weight: 180 lb 1.6 oz (81.692 kg) Heparin Dosing Weight:   Vital Signs: Temp: 97.9 F (36.6 C) (08/13 0814) Temp Source: Oral (08/13 0814) BP: 146/71 mmHg (08/13 0814) Pulse Rate: 79 (08/13 0814)  Labs:  Recent Labs  03/14/15 0413 03/14/15 1847 03/15/15 0429  HGB  --   --  12.0  HCT  --   --  37.9  PLT  --   --  439*  LABPROT 37.9* 23.8* 21.3*  INR 3.99* 2.15* 1.85*  CREATININE 3.17*  --  3.45*    Estimated Creatinine Clearance: 15.7 mL/min (by C-G formula based on Cr of 3.45).   Medications:  Scheduled:  . sodium chloride   Intravenous Once  . atorvastatin  20 mg Oral QHS  . carvedilol  6.25 mg Oral BID WC  . dicyclomine  20 mg Oral TID AC  . feeding supplement (PRO-STAT SUGAR FREE 64)  30 mL Oral Daily  . furosemide  80 mg Oral BID  . insulin aspart  0-5 Units Subcutaneous QHS  . insulin aspart  0-9 Units Subcutaneous TID WC  . insulin glargine  20 Units Subcutaneous Daily  . loperamide  4 mg Oral Daily  . multivitamin with minerals  1 tablet Oral Daily  . ondansetron (ZOFRAN) IV  4 mg Intravenous Once  . pantoprazole  40 mg Oral Q0600  . saccharomyces boulardii  250 mg Oral BID  . sodium bicarbonate  650 mg Oral BID   Infusions:    Assessment: 69 yo female s/p colonoscopy will be restarted on coumadin for hx of LV thrombus.  INR today is down to 1.85 after FFP given on 08/12 and vitamin K on 08/11.  Goal of Therapy:  INR 2-3 Monitor platelets by  anticoagulation protocol: Yes   Plan:  - coumadin 1.5 mg po x1 - INR in am  Jewelz Kobus, Tsz-Yin 03/15/2015,11:53 AM

## 2015-03-15 NOTE — Care Management Note (Signed)
Case Management Note  Patient Details  Name: KATI RIGGENBACH MRN: [id number] Date of Birth: 20-Jan-1946  Subjective/Objective:       CHF             Action/Plan: Home Health  Expected Discharge Date:  03/17/2015              Expected Discharge Plan:  [place]  In-House Referral:  NA  Discharge planning Services  CM Consult  Post Acute Care Choice:  Resumption of Svcs/PTA Provider, Home Health Choice offered to:  Patient  DME Arranged:    DME Agency:     HH Arranged:  RN, PT, OT HH Agency:  [place]  Status of Service:  Completed, signed off  Medicare Important Message Given:  Yes-fourth notification given Date Medicare IM Given:    Medicare IM give by:    Date Additional Medicare IM Given:    Additional Medicare Important Message give by:     If discussed at [place] of Stay Meetings, dates discussed:    Additional Comments: NCM spoke to pt and she is active with [facility] for [facility]. States she has RW, wheelchair, transport chair, 3n1 bedside commode and shower chair at home. She lives alone but is able to care for herself at home. Has family support to assist when needed. Will need resumption of care orders for home.  Erenest Rasher, RN 03/15/2015, 10:55 AM

## 2015-03-15 NOTE — Progress Notes (Signed)
[facility] PROGRESS NOTE  [name] WLN:[id number] DOB: [birth year] DOA: 03/09/2015 PCP: Antony Blackbird, MD  Assessment/Plan:  1. Abdominal pain- patient came with abdominal pain with nausea vomiting and diarrhea. Likely from mesenteric ischemia from low flow state. CT abdomen pelvis were negative for acute findings. C. difficile PCR negative. Patient also had gastric empty study on 6-16 which was unremarkable. MRI injury gram showed no evidence of mesenteric arterial occlusive disease. Proximal mesenteric arteries are normally patent. Patient underwent EGD and colonoscopy. EGD showed no significant abnormality. Colonoscopy showed polyps and mild diverticulosis. Patient was started on Imodium and Bentyl per GI. Will have to change at discharge because patient cannot afford Bentyl. Discussed with Dr. Carol Ada recommends that patient can be discharged on peppermint oil. 2. CHF- patient appears to be euvolemic. Continue Coreg, patient was discharged on 03/06/2015 on Lasix 80 mg twice a day, Lasix has been on hold in the hospital for worsening renal function. Patient started back on Lasix. Today creatinine 3.45 3. Acute on chronic kidney disease stage IV- likely cardiorenal syndrome. Creatinine now back to baseline 3.45.  Restarted lasix 80 mg po bid. 4. LV thrombus- continue anticoagulation with Coumadin. 5. Hypokalemia- replace potassium and check BMP in a.m. 6. Diabetes mellitus type 2- continue Lantus 20 units daily, sliding scale insulin with NovoLog 7. Hypertension- blood pressure controlled, continue Coreg, BiDil 8. History of pulmonary hypertension-stable, PA pressure 62 mmHg by echo on 01/21/2015  Code Status: Full code Family Communication: No family at bedside Disposition Plan: Home when medically stable   Consultants:  Gastroenterology  Procedures:  None  Antibiotics: None HPI/Subjective: 69 year old female with a history of COPD (2L at home), chronic  respiratory failure, chronic systolic and diastolic CHF, CKD stage IV, LV thrombus on anticoagulation, hypertension, diabetes mellitus type 2, sarcoidosis presented with nausea, vomiting, diarrhea, abdominal pain. The patient was recently discharged from the hospital on 03/06/2015 with a similar presentation. The patient was seen by gastroenterology at that time who felt that this may be due to a low flow state and chronic mesenteric ischemia. At the time of discharge, the patient did not have any abdominal pain and was tolerating her diet. However, the patient developed abdominal pain with nausea and vomiting on 03/09/2015.  Patient still complains of mild abdominal pain, EGD was negative colonoscopy showed polyps and multiple biopsies are obtained. Patient started on Imodium and Bentyl. She says that she cannot afford Bentyl.   Objective: Filed Vitals:   03/15/15 0814  BP: 146/71  Pulse: 79  Temp: 97.9 F (36.6 C)  Resp: 18    Intake/Output Summary (Last 24 hours) at 03/15/15 1520 Last data filed at 03/15/15 1443  Gross per 24 hour  Intake    800 ml  Output    801 ml  Net     -1 ml   Filed Weights   03/13/15 2009 03/14/15 2121 03/15/15 0126  Weight: 79.2 kg (174 lb 9.7 oz) 81.692 kg (180 lb 1.6 oz) 81.692 kg (180 lb 1.6 oz)    Exam:   General:  Appears in no acute distress  Cardiovascular: S1-S2 regular  Respiratory: Clear to auscultation bilaterally  Abdomen: Soft, nontender, no organomegaly  Musculoskeletal: No edema. Lower extremities   Data Reviewed: Basic Metabolic Panel:  Recent Labs Lab 03/10/15 0537 03/11/15 1005 03/12/15 0517 03/14/15 0413 03/15/15 0429  NA 132* 132* 137 141 137  K 3.5 3.9 3.6 3.4* 3.9  CL 97* 98* 102 106 107  CO2 21* 23 24  24 24  GLUCOSE 129* 165* 116* 50* 90  BUN 72* 67* 59* 37* 33*  CREATININE 4.16* 4.04* 3.75* 3.17* 3.45*  CALCIUM 9.2 9.3 9.4 9.8 9.2  PHOS  --  5.1*  --   --   --    Liver Function Tests:  Recent Labs Lab  03/09/15 1540 03/11/15 1005 03/12/15 0517  AST 32  --  31  ALT 19  --  21  ALKPHOS 148*  --  129*  BILITOT 0.8  --  0.7  PROT 6.4*  --  5.3*  ALBUMIN 2.8* 2.4* 2.3*    Recent Labs Lab 03/09/15 1540  LIPASE 50   No results for input(s): AMMONIA in the last 168 hours. CBC:  Recent Labs Lab 03/09/15 1540 03/10/15 0537 03/11/15 1005 03/12/15 0517 03/15/15 0429  WBC 12.9* 12.1* 8.7 8.0 9.8  NEUTROABS 6.8  --   --   --   --   HGB 14.2 12.7 12.8 12.3 12.0  HCT 42.0 38.6 39.8 38.2 37.9  MCV 86.4 87.7 90.0 90.1 89.4  PLT 546* 490* 469* 458* 439*   Cardiac Enzymes: No results for input(s): CKTOTAL, CKMB, CKMBINDEX, TROPONINI in the last 168 hours. BNP (last 3 results)  Recent Labs  01/21/15 1830 02/02/15 0517 02/24/15 1612  BNP 1056.8* 2924.8* 475.5*    ProBNP (last 3 results)  Recent Labs  04/26/14 2358 06/24/14 0945  PROBNP 3725.0* 324.0*    CBG:  Recent Labs Lab 03/14/15 1257 03/14/15 1651 03/14/15 2118 03/15/15 0814 03/15/15 1213  GLUCAP 93 183* 110* 105* 175*    Recent Results (from the past 240 hour(s))  C difficile quick scan w PCR reflex     Status: None   Collection Time: 03/10/15 12:54 PM  Result Value Ref Range Status   C Diff antigen NEGATIVE NEGATIVE Final   C Diff toxin NEGATIVE NEGATIVE Final   C Diff interpretation Negative for toxigenic C. difficile  Final     Studies: No results found.  Scheduled Meds: . sodium chloride   Intravenous Once  . atorvastatin  20 mg Oral QHS  . carvedilol  6.25 mg Oral BID WC  . dicyclomine  20 mg Oral TID AC  . feeding supplement (PRO-STAT SUGAR FREE 64)  30 mL Oral Daily  . furosemide  80 mg Oral BID  . insulin aspart  0-5 Units Subcutaneous QHS  . insulin aspart  0-9 Units Subcutaneous TID WC  . insulin glargine  20 Units Subcutaneous Daily  . loperamide  4 mg Oral Daily  . multivitamin with minerals  1 tablet Oral Daily  . ondansetron (ZOFRAN) IV  4 mg Intravenous Once  . pantoprazole   40 mg Oral Q0600  . saccharomyces boulardii  250 mg Oral BID  . sodium bicarbonate  650 mg Oral BID  . warfarin  1.5 mg Oral ONCE-1800  . Warfarin - Pharmacist Dosing Inpatient   Does not apply q1800   Continuous Infusions:   Principal Problem:   Nausea and vomiting Active Problems:   Essential hypertension   Mural thrombus of cardiac apex   Chronic systolic CHF (congestive heart failure)   Chronic kidney disease (CKD), stage IV (severe)   Thrombocytosis   DM type 2 causing CKD stage 4   COPD (chronic obstructive pulmonary disease)   AP (abdominal pain)   Elevated lactic acid level   Abdominal aortic atherosclerosis   Periumbilical abdominal pain   Benign neoplasm of sigmoid colon    Time spent: 25 min  [facility] Pager [phone]. If 7PM-7AM, please contact night-coverage at [email or web address], password [facility] 03/15/2015, 3:20 PM  LOS: 6 days

## 2015-03-16 DIAGNOSIS — J438 Other emphysema: Secondary | ICD-10-CM

## 2015-03-16 LAB — PROTIME-INR
INR: 1.44 (ref 0.00–1.49)
Prothrombin Time: 17.6 seconds — ABNORMAL HIGH (ref 11.6–15.2)

## 2015-03-16 LAB — GLUCOSE, CAPILLARY
GLUCOSE-CAPILLARY: 143 mg/dL — AB (ref 65–99)
GLUCOSE-CAPILLARY: 110 mg/dL — AB (ref 65–99)

## 2015-03-16 MED ORDER — LOPERAMIDE HCL 2 MG PO CAPS: 4.0000 mg | ORAL_CAPSULE | Freq: Every day | ORAL | Status: DC

## 2015-03-16 MED ORDER — WARFARIN SODIUM 3 MG PO TABS
3.0000 mg | ORAL_TABLET | ORAL | Status: AC
  Administered 2015-03-16: 3 mg via ORAL
  Filled 2015-03-16: qty 1

## 2015-03-16 MED ORDER — WARFARIN SODIUM 2 MG PO TABS
2.0000 mg | ORAL_TABLET | Freq: Once | ORAL | Status: DC
  Filled 2015-03-16: qty 1

## 2015-03-16 NOTE — Care Management Note (Addendum)
Case Management Note  Patient Details  Name: TERRIAN RIDLON MRN: [id number] Date of Birth: 1945-11-13  Subjective/Objective: CHF    Action/Plan: Home Health  Expected Discharge Date: 03/16/2015   Expected Discharge Plan: [place]  In-House Referral: NA  Discharge planning Services CM Consult  Post Acute Care Choice: Resumption of Svcs/PTA Provider, Home Health Choice offered to: Patient  DME Arranged:   DME Agency:    HH Arranged: RN, PT, OT HH Agency: [facility]  Status of Service: Completed, signed off  Medicare Important Message Given: Yes-fourth notification given Date Medicare IM Given:   Medicare IM give by:   Date Additional Medicare IM Given:   Additional Medicare Important Message give by:    If discussed at [place] of Stay Meetings, dates discussed:   Additional Comments: NCM spoke to pt and she is active with [facility] for [facility]. States she has RW, wheelchair, transport chair, 3n1 bedside commode and shower chair at home . She lives alone but is able to care for herself at home. Has family support to assist when needed. [place] Liaison contacted regarding  resumption of care start  Date of 03/17/15. No further CM needs identified. Patient will be transported with family via private vehicle.  Laurena Slimmer, RN 03/16/2015, 12:09 PM

## 2015-03-16 NOTE — Progress Notes (Signed)
Subjective: Increase in pain last evening, but this is not out of the norm of her pattern of pain.  Feeling well at this time.  Objective: Vital signs in last 24 hours: Temp:  [97.9 F (36.6 C)-98.2 F (36.8 C)] 98.2 F (36.8 C) (08/14 0520) Pulse Rate:  [70-79] 71 (08/14 0520) Resp:  [16-20] 16 (08/14 0520) BP: (115-149)/(49-71) 123/66 mmHg (08/14 0520) SpO2:  [93 %-99 %] 93 % (08/14 0520) Weight:  [82.3 kg (181 lb 7 oz)] 82.3 kg (181 lb 7 oz) (08/14 0520) Last BM Date: 03/14/15  Intake/Output from previous day: 08/13 0701 - 08/14 0700 In: 760 [P.O.:600; I.V.:160] Out: 700 [Urine:700] Intake/Output this shift:    General appearance: alert and no distress GI: tender in the periumbilical area  Lab Results:  Recent Labs  03/15/15 0429  WBC 9.8  HGB 12.0  HCT 37.9  PLT 439*   BMET  Recent Labs  03/14/15 0413 03/15/15 0429  NA 141 137  K 3.4* 3.9  CL 106 107  CO2 24 24  GLUCOSE 50* 90  BUN 37* 33*  CREATININE 3.17* 3.45*  CALCIUM 9.8 9.2   LFT No results for input(s): PROT, ALBUMIN, AST, ALT, ALKPHOS, BILITOT, BILIDIR, IBILI in the last 72 hours. PT/INR  Recent Labs  03/15/15 0429 03/16/15 0510  LABPROT 21.3* 17.6*  INR 1.85* 1.44   Hepatitis Panel No results for input(s): HEPBSAG, HCVAB, HEPAIGM, HEPBIGM in the last 72 hours. C-Diff No results for input(s): CDIFFTOX in the last 72 hours. Fecal Lactopherrin No results for input(s): FECLLACTOFRN in the last 72 hours.  Studies/Results: No results found.  Medications:  Scheduled: . sodium chloride   Intravenous Once  . atorvastatin  20 mg Oral QHS  . carvedilol  6.25 mg Oral BID WC  . dicyclomine  20 mg Oral TID AC  . feeding supplement (PRO-STAT SUGAR FREE 64)  30 mL Oral Daily  . furosemide  80 mg Oral BID  . insulin aspart  0-5 Units Subcutaneous QHS  . insulin aspart  0-9 Units Subcutaneous TID WC  . insulin glargine  20 Units Subcutaneous Daily  . loperamide  4 mg Oral Daily  .  multivitamin with minerals  1 tablet Oral Daily  . ondansetron (ZOFRAN) IV  4 mg Intravenous Once  . pantoprazole  40 mg Oral Q0600  . saccharomyces boulardii  250 mg Oral BID  . sodium bicarbonate  650 mg Oral BID  . Warfarin - Pharmacist Dosing Inpatient   Does not apply q1800   Continuous:   Assessment/Plan: 1) Periumbilical pain. 2) ESRD.   Her pain is stable.  Unfortunately there is no clear etiology.    Plan: 1) Okay to D/C home. 2) Follow up with [facility] for further management. 3) Signing off.   LOS: 7 days   Annalese Stiner D 03/16/2015, 7:42 AM

## 2015-03-16 NOTE — Discharge Summary (Signed)
Physician Discharge Summary  [name] PRF:[id number] DOB: 1946-06-25 DOA: 03/09/2015  PCP: Antony Blackbird, MD  Admit date: 03/09/2015 Discharge date: 03/16/2015  Time spent: *25 minutes  Recommendations for Outpatient Follow-up:  1. 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   COPD (chronic obstructive pulmonary disease)   AP (abdominal pain)   Elevated lactic acid level   Abdominal aortic atherosclerosis   Periumbilical abdominal pain   Benign neoplasm of sigmoid colon   Discharge Condition: Stable  Diet recommendation: Low salt diet  Filed Weights   03/14/15 2121 03/15/15 0126 03/16/15 0520  Weight: 81.692 kg (180 lb 1.6 oz) 81.692 kg (180 lb 1.6 oz) 82.3 kg (181 lb 7 oz)    History of present illness:  69 year old female with a history of COPD (2L at home), chronic respiratory failure, chronic systolic and diastolic CHF, CKD stage IV, LV thrombus on anticoagulation, hypertension, diabetes mellitus type 2, sarcoidosis presented with nausea, vomiting, diarrhea, abdominal pain. The patient was recently discharged from the hospital on 03/06/2015 with a similar presentation. The patient was seen by gastroenterology at that time who felt that this may be due to a low flow state and chronic mesenteric ischemia. At the time of discharge, the patient did not have any abdominal pain and was tolerating her diet. However, the patient developed abdominal pain with nausea and vomiting on 03/09/2015.   Hospital Course:  1. Abdominal pain- patient came with abdominal pain with nausea vomiting and diarrhea. Likely from mesenteric ischemia from low flow state. CT abdomen pelvis were negative for acute  findings. C. difficile PCR negative. Patient also had gastric empty study on 6-16 which was unremarkable. MRI injury gram showed no evidence of mesenteric arterial occlusive disease. Proximal mesenteric arteries are normally patent. Patient underwent EGD and colonoscopy. EGD showed no significant abnormality. Colonoscopy showed polyps and mild diverticulosis. Patient was started on Imodium and Bentyl per GI. Will have to change at discharge because patient cannot afford Bentyl. Discussed with Dr. Carol Ada recommends that patient can be discharged on peppermint oil. Told patient to get peppermint oil from health food store. 2. CHF- patient appears to be euvolemic. Continue Coreg, patient was discharged on 03/06/2015 on Lasix 80 mg twice a day, Lasix has been on hold in the hospital for worsening renal function. Patient started back on Lasix. Last creatinine from 03/15/15 was 3.45 3. Acute on chronic kidney disease stage IV- likely cardiorenal syndrome. Creatinine now back to baseline 3.45. Restarted lasix 80 mg po bid. 4. LV thrombus- continue anticoagulation with Coumadin. INR is subtherapeutic, will give double dose of coumadin 3 mg po x 1, will start taking her usual dose 1.5 mg from tomorrow. Will need to check INR in 3 days.  5. Hypokalemia- replace potassium and check BMP in a.m. 6. Diabetes mellitus type 2- continue Lantus 20 units daily 7. Hypertension- blood pressure controlled, continue Coreg, BiDil 8. History of pulmonary hypertension-stable, PA pressure 62 mmHg by echo on 01/21/2015  Patient has prednisone listed as home med, discussed with patient, and she says it was stopped and she no longer takes prednisone at home.  Procedures:  EGD  Colonoscopy  Consultations:  GI  Discharge Exam: Filed Vitals:   03/16/15 0814  BP: 129/75  Pulse: 78  Temp: 97.8 F (36.6 C)  Resp: 16    General: Appear in no acute distress Cardiovascular: S1S2 RRR Respiratory: Clear  bilaterally  Discharge Instructions   Discharge Instructions    Diet - low sodium heart healthy    Complete by:  As directed      Discharge instructions    Complete by:  As directed   Start taking coumadin from tomorrow, and get INR checked on tuesday     Increase activity slowly    Complete by:  As directed           Current Discharge Medication List    START taking these medications   Details  !! loperamide (IMODIUM) 2 MG capsule Take 2 capsules (4 mg total) by mouth daily. Qty: 30 capsule, Refills: 0     !! - Potential duplicate medications found. Please discuss with provider.    CONTINUE these medications which have NOT CHANGED   Details  acetaminophen (TYLENOL) 500 MG tablet Take 1,000 mg by mouth 2 (two) times daily as needed (pain).     albuterol (PROVENTIL HFA;VENTOLIN HFA) 108 (90 BASE) MCG/ACT inhaler Inhale 2 puffs into the lungs every 4 (four) hours as needed for wheezing or shortness of breath.     albuterol (PROVENTIL) (2.5 MG/3ML) 0.083% nebulizer solution Take 2.5 mg by nebulization every 2 (two) hours as needed for wheezing or shortness of breath.    Alpha-D-Galactosidase (BEANO PO) Take 1 tablet by mouth daily.    Apremilast (OTEZLA) 30 MG TABS Take 30 mg by mouth daily.     atorvastatin (LIPITOR) 20 MG tablet Take 20 mg by mouth at bedtime.    carvedilol (COREG) 12.5 MG tablet Take 0.5 tablets (6.25 mg total) by mouth 2 (two) times daily with a meal. Qty: 60 tablet, Refills: 0    dicyclomine (BENTYL) 10 MG capsule Take 1 tab twice daily. Qty: 60 capsule, Refills: 0    esomeprazole (NEXIUM) 40 MG capsule Take 1 capsule by mouth 30 min before breakfast. Qty: 30 capsule, Refills: 6    famotidine (PEPCID) 20 MG tablet Take 1 tablet (20 mg total) by mouth daily. Qty: 30 tablet, Refills: 3    feeding supplement (BOOST / RESOURCE BREEZE) LIQD Take 1 Container by mouth 3 (three) times daily between meals. Qty: 90 Container, Refills: 0    ferrous  sulfate 325 (65 FE) MG tablet Take 325 mg by mouth daily with breakfast.    fluticasone (FLONASE) 50 MCG/ACT nasal spray Place 2 sprays into both nostrils daily as needed for allergies or rhinitis.     Fluticasone-Salmeterol (ADVAIR) 250-50 MCG/DOSE AEPB Inhale 1 puff into the lungs 2 (two) times daily. Qty: 180 each, Refills: 3    furosemide (LASIX) 80 MG tablet Take 1 tablet (80 mg total) by mouth 2 (two) times daily. Qty: 60 tablet, Refills: 0    hydrocortisone (ANUSOL-HC) 2.5 % rectal cream Place 1 application rectally 4 (four) times daily. Qty: 30 g, Refills: 0    insulin lispro (HUMALOG KWIKPEN) 100 UNIT/ML KiwkPen Inject 0.01-0.1 mLs (1-10 Units total) into the skin 3 (three) times daily. Sliding scale CBG 70 - 120: 0 units CBG 121 - 150: 1 unit,  CBG 151 - 200: 2 units,  CBG 201 - 250: 3 units,  CBG 251 - 300: 5 units,  CBG 301 - 350: 7 units,  CBG 351 - 400: 9 units   CBG >  400: 10 units and notify your MD Qty: 15 mL, Refills: 3    isosorbide mononitrate (IMDUR) 30 MG 24 hr tablet Take 15 mg by mouth daily. Refills: 0    !! loperamide (IMODIUM) 2 MG capsule Take 1 capsule (2 mg total) by mouth 2 (two) times daily as needed for diarrhea or loose stools. Qty: 60 capsule, Refills: 0    Multiple Vitamins-Minerals (MULTIVITAMIN WITH MINERALS) tablet Take 1 tablet by mouth daily.    ondansetron (ZOFRAN) 4 MG tablet Take 1 tablet (4 mg total) by mouth every 8 (eight) hours as needed for nausea or vomiting. Qty: 90 tablet, Refills: 3    promethazine (PHENERGAN) 25 MG tablet Take 1 tablet (25 mg total) by mouth every 4 (four) hours as needed for nausea or vomiting. Qty: 30 tablet, Refills: 0    saccharomyces boulardii (FLORASTOR) 250 MG capsule Take 1 capsule (250 mg total) by mouth 2 (two) times daily. Qty: 60 capsule, Refills: 3    sodium bicarbonate 650 MG tablet Take 1 tablet (650 mg total) by mouth 2 (two) times daily. Qty: 60 tablet, Refills: 3    TOUJEO SOLOSTAR 300 UNIT/ML  SOPN Inject 40 Units into the skin every morning. Refills: 3    triamcinolone cream (KENALOG) 0.1 % Apply 1 application topically 2 (two) times daily.    warfarin (COUMADIN) 1 MG tablet Take 1.5 tablets (1.5 mg total) by mouth every evening. Take 3 mg today -03/06/15-and then resume 1.5 mg from 8/5 Qty: 45 tablet, Refills: 1    isosorbide-hydrALAZINE (BIDIL) 20-37.5 MG per tablet Take 0.5 tablets by mouth 3 (three) times daily. Qty: 90 tablet, Refills: 0     !! - Potential duplicate medications found. Please discuss with provider.    STOP taking these medications     cephALEXin (KEFLEX) 250 MG capsule      predniSONE (DELTASONE) 20 MG tablet        Allergies  Allergen Reactions  . Adhesive [Tape] Other (See Comments)    Blisters   . Avelox [Moxifloxacin Hcl In Nacl] Other (See Comments)    GI upset  . Codeine Other (See Comments)    "crazy"   . Guaifenesin Nausea And Vomiting    Takes Mucinex at home without issue  . Latex Swelling  . Oxycodone Nausea And Vomiting    Takes Percocet at home without issue   Follow-up Information    Follow up with [facility].   Why:  Home Health RN, Physical Therapy   Contact information:   [address] [place] [address] [phone]       Follow up with FULP, CAMMIE, MD In 3 days.   Specialty:  Family Medicine   Why:  Check INR in 3 days   Contact information:   [address] [address] [phone]        The results of significant diagnostics from this hospitalization (including imaging, microbiology, ancillary and laboratory) are listed below for reference.    Significant Diagnostic Studies: Ct Abdomen Pelvis Wo Contrast  03/09/2015   CLINICAL DATA:  Patient recently admitted to the hospital for abdominal pain and nausea/vomiting. Returning abdominal pain and cramping.  EXAM: CT ABDOMEN AND PELVIS WITHOUT CONTRAST  TECHNIQUE: Multidetector CT imaging of the abdomen and pelvis was  performed following the standard protocol without IV contrast.  COMPARISON:  CT abdomen pelvis 01/20/2015  FINDINGS: Lower chest: Coronary arterial vascular calcifications. Normal heart size. Scarring and or atelectasis within right  greater than left lower lobes. No pleural effusion.  Hepatobiliary: Stable irregular area of low attenuation and calcification within the posterior right hepatic lobe, likely sequelae of remote trauma. No new hepatic lesions identified. Post cholecystectomy changes.  Pancreas: Unremarkable  Spleen: Splenosis.  Surgically absent spleen.  Adrenals/Urinary Tract: Normal adrenal glands. Kidneys are symmetric in size. No nephroureterolithiasis. No hydronephrosis. Urinary bladder is unremarkable.  Stomach/Bowel: Oral contrast material to the rectum. Few scattered colonic diverticuli. No evidence for acute diverticulitis. No evidence for bowel obstruction. No free fluid or free intraperitoneal air.  Vascular/Lymphatic: Normal caliber abdominal aorta. No retroperitoneal adenopathy.  Other: None  Musculoskeletal: No aggressive or acute appearing osseous lesions.  IMPRESSION: No acute process within the abdomen or pelvis.  Status post splenectomy with multiple splenules.  Post cholecystectomy.   Electronically Signed   By: Lovey Newcomer M.D.   On: 03/09/2015 20:21   Dg Chest 2 View  02/24/2015   CLINICAL DATA:  Short of breath and weakness  EXAM: CHEST  2 VIEW  COMPARISON:  CT 02/14/2015  FINDINGS: Normal mediastinum and cardiac silhouette. Chronic central bronchitic markings. Normal pulmonary vasculature. No effusion, infiltrate, or pneumothorax.  IMPRESSION: Mild bronchitic markings.  No acute findings.   Electronically Signed   By: Suzy Bouchard M.D.   On: 02/24/2015 17:02   Mr [name] Abdomen Wo Contrast  03/12/2015   CLINICAL DATA:  Frequent episodes of abdominal pain, nausea, vomiting loose stools. History of stage 4 chronic kidney disease prohibiting use of iodinated contrast and  gadolinium with GFR of 12 mL/minute.  EXAM: MRA ABDOMEN WITHOUT CONTRAST  TECHNIQUE: Angiographic images of the chest were obtained using MRA technique without intravenous contrast.  CONTRAST:  None  COMPARISON:  Unenhanced CT of the abdomen and pelvis on 03/09/2015  FINDINGS: Good quality MRA imaging was able to be obtained utilizing flow sensitive sequences. The abdominal aorta is of normal caliber. There is no evidence of significant atherosclerosis or aneurysmal disease of the aorta. Proximal mesenteric arteries are well visualized. The celiac trunk, proximal SMA and proximal IMA show normal patency. Branch vessel evaluation is limited by unenhanced techniques. 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 The common hepatic artery is widely patent.  Proximal segments of the renal arteries are normally patent bilaterally. The preliminary unenhanced imaging shows no obvious abnormalities involving bowel loops. No abnormal fluid collections are seen.  IMPRESSION: Adequate unenhanced MRA of the abdomen demonstrates no evidence of mesenteric arterial occlusive disease. Proximal mesenteric arteries are normally patent. 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 Bilateral proximal renal artery segments are normally patent.   Electronically Signed   By: Aletta Edouard M.D.   On: 03/12/2015 09:03   Dg Chest Port 1 View  03/10/2015   CLINICAL DATA:  Central line placement today.  EXAM: PORTABLE CHEST - 1 VIEW  COMPARISON:  PA and lateral chest 02/24/2015.  FINDINGS: Right IJ central venous catheter is now in place with the tip projecting just above the superior cavoatrial junction. No pneumothorax is identified. The lungs appear clear. Heart size is normal.  IMPRESSION: Right IJ catheter tip projects just above the superior cavoatrial junction. Negative for pneumothorax.   Electronically Signed   By: Inge Rise M.D.   On: 03/10/2015 13:53    Microbiology: Recent  Results (from the past 240 hour(s))  C difficile quick scan w PCR reflex     Status: None   Collection Time: 03/10/15 12:54 PM  Result  Value Ref Range Status   C Diff antigen NEGATIVE NEGATIVE Final   C Diff toxin NEGATIVE NEGATIVE Final   C Diff interpretation Negative for toxigenic C. difficile  Final     Labs: Basic Metabolic Panel:  Recent Labs Lab 03/10/15 0537 03/11/15 1005 03/12/15 0517 03/14/15 0413 03/15/15 0429  NA 132* 132* 137 141 137  K 3.5 3.9 3.6 3.4* 3.9  CL 97* 98* 102 106 107  CO2 21* 23 24 24 24   GLUCOSE 129* 165* 116* 50* 90  BUN 72* 67* 59* 37* 33*  CREATININE 4.16* 4.04* 3.75* 3.17* 3.45*  CALCIUM 9.2 9.3 9.4 9.8 9.2  PHOS  --  5.1*  --   --   --    Liver Function Tests:  Recent Labs Lab 03/09/15 1540 03/11/15 1005 03/12/15 0517  AST 32  --  31  ALT 19  --  21  ALKPHOS 148*  --  129*  BILITOT 0.8  --  0.7  PROT 6.4*  --  5.3*  ALBUMIN 2.8* 2.4* 2.3*    Recent Labs Lab 03/09/15 1540  LIPASE 50   No results for input(s): AMMONIA in the last 168 hours. CBC:  Recent Labs Lab 03/09/15 1540 03/10/15 0537 03/11/15 1005 03/12/15 0517 03/15/15 0429  WBC 12.9* 12.1* 8.7 8.0 9.8  NEUTROABS 6.8  --   --   --   --   HGB 14.2 12.7 12.8 12.3 12.0  HCT 42.0 38.6 39.8 38.2 37.9  MCV 86.4 87.7 90.0 90.1 89.4  PLT 546* 490* 469* 458* 439*   Cardiac Enzymes: No results for input(s): CKTOTAL, CKMB, CKMBINDEX, TROPONINI in the last 168 hours. BNP: BNP (last 3 results)  Recent Labs  01/21/15 1830 02/02/15 0517 02/24/15 1612  BNP 1056.8* 2924.8* 475.5*    ProBNP (last 3 results)  Recent Labs  04/26/14 2358 06/24/14 0945  PROBNP 3725.0* 324.0*    CBG:  Recent Labs Lab 03/15/15 1631 03/15/15 1711 03/15/15 2124 03/16/15 0814 03/16/15 1130  GLUCAP 60* 76 134* 143* 110*       Signed:  Idalie Canto S  [facility] 03/16/2015, 12:14 PM

## 2015-03-16 NOTE — Progress Notes (Signed)
ANTICOAGULATION CONSULT NOTE - Follow Up Consult  Pharmacy Consult for coumadin Indication: hx of LV thrombus  Allergies  Allergen Reactions  . Adhesive [Tape] Other (See Comments)    Blisters   . Avelox [Moxifloxacin Hcl In Nacl] Other (See Comments)    GI upset  . Codeine Other (See Comments)    "crazy"   . Guaifenesin Nausea And Vomiting    Takes Mucinex at home without issue  . Latex Swelling  . Oxycodone Nausea And Vomiting    Takes Percocet at home without issue    Patient Measurements: Weight: 181 lb 7 oz (82.3 kg) Heparin Dosing Weight:   Vital Signs: Temp: 97.8 F (36.6 C) (08/14 0814) Temp Source: Oral (08/14 0814) BP: 129/75 mmHg (08/14 0814) Pulse Rate: 78 (08/14 0814)  Labs:  Recent Labs  03/14/15 0413 03/14/15 1847 03/15/15 0429 03/16/15 0510  HGB  --   --  12.0  --   HCT  --   --  37.9  --   PLT  --   --  439*  --   LABPROT 37.9* 23.8* 21.3* 17.6*  INR 3.99* 2.15* 1.85* 1.44  CREATININE 3.17*  --  3.45*  --     Estimated Creatinine Clearance: 15.7 mL/min (by C-G formula based on Cr of 3.45).   Medications:  Scheduled:  . sodium chloride   Intravenous Once  . atorvastatin  20 mg Oral QHS  . carvedilol  6.25 mg Oral BID WC  . dicyclomine  20 mg Oral TID AC  . feeding supplement (PRO-STAT SUGAR FREE 64)  30 mL Oral Daily  . furosemide  80 mg Oral BID  . insulin aspart  0-5 Units Subcutaneous QHS  . insulin aspart  0-9 Units Subcutaneous TID WC  . insulin glargine  20 Units Subcutaneous Daily  . loperamide  4 mg Oral Daily  . multivitamin with minerals  1 tablet Oral Daily  . ondansetron (ZOFRAN) IV  4 mg Intravenous Once  . pantoprazole  40 mg Oral Q0600  . saccharomyces boulardii  250 mg Oral BID  . sodium bicarbonate  650 mg Oral BID  . Warfarin - Pharmacist Dosing Inpatient   Does not apply q1800   Infusions:    Assessment: 69 yo female with hx of LV thrombus is currently on subtherapeutic coumadin.  INR today is down to 1.44.  Got FFP and vitamin K previously which will delay increase in INR.  Goal of Therapy:  INR 2-3 Monitor platelets by anticoagulation protocol: Yes   Plan:  Coumadin 2 mg po x1 - INR in am   Carolynn Tuley, Tsz-Yin 03/16/2015,11:44 AM

## 2015-03-17 ENCOUNTER — Encounter (HOSPITAL_COMMUNITY): Payer: Self-pay | Admitting: Internal Medicine

## 2015-03-17 ENCOUNTER — Encounter: Payer: Self-pay | Admitting: Internal Medicine

## 2015-03-17 NOTE — Progress Notes (Signed)
Patient was discharged to home yesterday. Patient called to request a refill on her Imdur. This RN informed patient she would need to call her PCP in order to receive prescription refills.   Joellen Jersey, RN.

## 2015-03-17 NOTE — Progress Notes (Signed)
Quick Note:  Biopsies are NL She did have colon polyps that were not removed due to anti-coagulation. She should be scheduled for a non-urgent f/u with Dr. Henrene Pastor re: abd pain, diarrhea and colon polyps. ______

## 2015-03-19 ENCOUNTER — Telehealth: Payer: Self-pay | Admitting: Cardiology

## 2015-03-19 ENCOUNTER — Ambulatory Visit (INDEPENDENT_AMBULATORY_CARE_PROVIDER_SITE_OTHER): Payer: Medicare Other | Admitting: Cardiology

## 2015-03-19 DIAGNOSIS — I2109 ST elevation (STEMI) myocardial infarction involving other coronary artery of anterior wall: Secondary | ICD-10-CM

## 2015-03-19 DIAGNOSIS — I214 Non-ST elevation (NSTEMI) myocardial infarction: Secondary | ICD-10-CM

## 2015-03-19 DIAGNOSIS — I513 Intracardiac thrombosis, not elsewhere classified: Secondary | ICD-10-CM

## 2015-03-19 DIAGNOSIS — Z5181 Encounter for therapeutic drug level monitoring: Secondary | ICD-10-CM

## 2015-03-19 LAB — POCT INR: INR: 1.8

## 2015-03-19 NOTE — Telephone Encounter (Signed)
New message     Nurse calling with PTINR results INR was 1.8 PT 21.6

## 2015-03-19 NOTE — Telephone Encounter (Signed)
See Anti-coag note  

## 2015-03-24 ENCOUNTER — Ambulatory Visit (INDEPENDENT_AMBULATORY_CARE_PROVIDER_SITE_OTHER): Payer: Medicare Other | Admitting: Cardiovascular Disease

## 2015-03-24 DIAGNOSIS — I214 Non-ST elevation (NSTEMI) myocardial infarction: Secondary | ICD-10-CM

## 2015-03-24 DIAGNOSIS — I513 Intracardiac thrombosis, not elsewhere classified: Secondary | ICD-10-CM

## 2015-03-24 DIAGNOSIS — I2109 ST elevation (STEMI) myocardial infarction involving other coronary artery of anterior wall: Secondary | ICD-10-CM

## 2015-03-24 DIAGNOSIS — Z5181 Encounter for therapeutic drug level monitoring: Secondary | ICD-10-CM

## 2015-03-24 LAB — POCT INR: INR: 2.9

## 2015-03-26 ENCOUNTER — Ambulatory Visit (HOSPITAL_BASED_OUTPATIENT_CLINIC_OR_DEPARTMENT_OTHER): Payer: Medicare Other

## 2015-03-26 ENCOUNTER — Encounter (HOSPITAL_COMMUNITY): Payer: Self-pay

## 2015-03-26 ENCOUNTER — Ambulatory Visit (HOSPITAL_COMMUNITY)
Admission: RE | Admit: 2015-03-26 | Discharge: 2015-03-26 | Disposition: A | Discharge status: Home / Self Care | Payer: Medicare Other | Source: Ambulatory Visit | Attending: Internal Medicine | Admitting: Internal Medicine

## 2015-03-26 VITALS — BP 112/62 | HR 81 | Wt 165.0 lb

## 2015-03-26 DIAGNOSIS — N184 Chronic kidney disease, stage 4 (severe): Secondary | ICD-10-CM

## 2015-03-26 DIAGNOSIS — I8290 Acute embolism and thrombosis of unspecified vein: Secondary | ICD-10-CM | POA: Diagnosis not present

## 2015-03-26 DIAGNOSIS — I951 Orthostatic hypotension: Secondary | ICD-10-CM | POA: Insufficient documentation

## 2015-03-26 DIAGNOSIS — R109 Unspecified abdominal pain: Secondary | ICD-10-CM | POA: Insufficient documentation

## 2015-03-26 DIAGNOSIS — I2510 Atherosclerotic heart disease of native coronary artery without angina pectoris: Secondary | ICD-10-CM | POA: Diagnosis not present

## 2015-03-26 DIAGNOSIS — I5022 Chronic systolic (congestive) heart failure: Secondary | ICD-10-CM

## 2015-03-26 DIAGNOSIS — I270 Primary pulmonary hypertension: Secondary | ICD-10-CM

## 2015-03-26 DIAGNOSIS — I272 Other secondary pulmonary hypertension: Secondary | ICD-10-CM | POA: Insufficient documentation

## 2015-03-26 MED ORDER — FUROSEMIDE 80 MG PO TABS: ORAL_TABLET | ORAL | Status: DC

## 2015-03-26 MED ORDER — CARVEDILOL 3.125 MG PO TABS: 3.1250 mg | ORAL_TABLET | Freq: Two times a day (BID) | ORAL | Status: DC

## 2015-03-26 NOTE — Progress Notes (Signed)
Patient ID: [name], female   DOB: 11-02-1945, 69 y.o.   MRN: [id number] PCP: Dr Chapman Fitch Pulmonology: Dr Lenna Gilford  69 yo with history of OHS/OSA, sarcoidosis, CAD, suspected ischemic cardiomyopathy, LV apical thrombus, and CKD stage IV presents for cardiology followup.  She was admitted in 6/16 with abdominal pain, nausea, vomiting, elevated lactate, and AKI on CKD.  Echo showed EF decreased to 30-35% with LV apical thrombus.  She was started on anticoagulation.  RHC was done while she was in the hospital, showing moderate pulmonary hypertension and RV>LV failure.  Lexiscan cardiolite showed no ischemia and no definite infarction.  She was thought to have infectious colitis versus possible mesenteric cardioembolism.  She was diuresed carefully given rise in creatinine.    She was admitted again on 03/01/15 with nausea, vomiting, and diarrhea.  Thought to have low flow mesenteric ischemia.  She had 2 episodes of syncope not associated with an arrhythmia while on telemetry, probably due to low BP.  Meds cut back.   She was re-admitted on 03/09/15 with recurrent abdominal pain.  MRA abdomen showed no obstructive mesenteric vascular disease.  EGD was unremarkable, and colonoscopy showed polyps.    She is back home now.  She is very fatigued, feels "drained" all the time.  She is lightheaded with standing and weak.  She has chronic abdominal pain, usually after eating, along with nausea.  She is using Zofran.  She is short of breath walking around her house and uses a walker.  Weight is down 10 lbs. She is orthostatic by BP measurement today.   Labs (7/16): K 4.6, creatinine 3.55 Labs (03/15/15): K 3.9, creatinine 3.45  PMH: 1. OHS/OSA: On CPAP at night and oxygen during the day.  2. Psoriasis 3. Obesity 4. CKD stage IV 5. HTN 6. H/o sarcoidosis: Hypercalcemia, splenic involvement, s/p splenectomy.  No definite lung involvement on 9/15 chest CT or 7/16 chest CT.  7. Chronic systolic CHF: Suspect  ischemic cardiomyopathy.  Echo (6/16) with EF 30-35%, apical thrombus, PA systolic pressure 62 mmHg.   8. LV apical thrombus 9. CAD: LHC (6/07) with 70-90% stenosis small OM.  See #7 above, suspect ischemi  cardiomyopathy but no repeat cath given CKD.  Lexiscan Cardiolite (6/16) with EF 47%, inferolateral fixed defect may be attenuation, no ischemia.   10. GERD 11. Gout 12. Hyperlipidemia 13. 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 PAH: RHC (6/16) with mean RA 13, PA 66/26 mean 39, mean PCWP 16, CI 2.43, PVR 4.9 WU.  ?OHS/OSA with pulmonary venous hypertension.  Cannot rule out role for sarcoidosis.  16. Chronic abdominal pain:  MRA in 8/16 showed no significant mesenteric vascular obstruction.  EGD 8/16 was unremarkable.  C-scope 8/16 with polyps.  Possible low flow mesenteric ischemia.  SH: Lives with niece, nonsmoker, no ETOH.   FH: CAD  ROS: All systems reviewed and negative except as per HPI.   Current Outpatient Prescriptions  Medication Sig Dispense Refill  . acetaminophen (TYLENOL) 500 MG tablet Take 1,000 mg by mouth 2 (two) times daily as needed (pain).     Marland Kitchen albuterol (PROVENTIL HFA;VENTOLIN HFA) 108 (90 BASE) MCG/ACT inhaler Inhale 2 puffs into the lungs every 4 (four) hours as needed for wheezing or shortness of breath.     Marland Kitchen albuterol (PROVENTIL) (2.5 MG/3ML) 0.083% nebulizer solution Take 2.5 mg by nebulization every 2 (two) hours as needed for wheezing or shortness of breath.    . Alpha-D-Galactosidase (BEANO PO) Take 1 tablet by mouth daily.    Marland Kitchen  Apremilast (OTEZLA) 30 MG TABS Take 30 mg by mouth daily.     Marland Kitchen atorvastatin (LIPITOR) 20 MG tablet Take 20 mg by mouth at bedtime.    Marland Kitchen esomeprazole (NEXIUM) 40 MG capsule Take 1 capsule by mouth 30 min before breakfast. 30 capsule 6  . famotidine (PEPCID) 20 MG tablet Take 1 tablet (20 mg total) by mouth daily. 30 tablet 3  . feeding supplement (BOOST / RESOURCE BREEZE) LIQD Take 1 Container by mouth 3 (three) times daily between meals. 90  Container 0  . ferrous sulfate 325 (65 FE) MG tablet Take 325 mg by mouth daily with breakfast.    . fluticasone (FLONASE) 50 MCG/ACT nasal spray Place 2 sprays into both nostrils daily as needed for allergies or rhinitis.     . Fluticasone-Salmeterol (ADVAIR) 250-50 MCG/DOSE AEPB Inhale 1 puff into the lungs 2 (two) times daily. 180 each 3  . furosemide (LASIX) 80 MG tablet Take 80 mg (2 tablets) in am and 40 mg (1 tablet) in pm 90 tablet 6  . hydrocortisone (ANUSOL-HC) 2.5 % rectal cream Place 1 application rectally 4 (four) times daily. 30 g 0  . insulin lispro (HUMALOG KWIKPEN) 100 UNIT/ML KiwkPen Inject 0.01-0.1 mLs (1-10 Units total) into the skin 3 (three) times daily. Sliding scale CBG 70 - 120: 0 units CBG 121 - 150: 1 unit,  CBG 151 - 200: 2 units,  CBG 201 - 250: 3 units,  CBG 251 - 300: 5 units,  CBG 301 - 350: 7 units,  CBG 351 - 400: 9 units   CBG > 400: 10 units and notify your MD 15 mL 3  . loperamide (IMODIUM) 2 MG capsule Take 2 capsules (4 mg total) by mouth daily. 30 capsule 0  . Multiple Vitamins-Minerals (MULTIVITAMIN WITH MINERALS) tablet Take 1 tablet by mouth daily.    . ondansetron (ZOFRAN) 4 MG tablet Take 1 tablet (4 mg total) by mouth every 8 (eight) hours as needed for nausea or vomiting. 90 tablet 3  . promethazine (PHENERGAN) 25 MG tablet Take 1 tablet (25 mg total) by mouth every 4 (four) hours as needed for nausea or vomiting. 30 tablet 0  . saccharomyces boulardii (FLORASTOR) 250 MG capsule Take 1 capsule (250 mg total) by mouth 2 (two) times daily. 60 capsule 3  . sodium bicarbonate 650 MG tablet Take 1 tablet (650 mg total) by mouth 2 (two) times daily. 60 tablet 3  . TOUJEO SOLOSTAR 300 UNIT/ML SOPN Inject 40 Units into the skin every morning.  3  . triamcinolone cream (KENALOG) 0.1 % Apply 1 application topically 2 (two) times daily.    Marland Kitchen warfarin (COUMADIN) 1 MG tablet Take 1.5 tablets (1.5 mg total) by mouth every evening. Take 3 mg today -03/06/15-and then  resume 1.5 mg from 8/5 45 tablet 1   No current facility-administered medications for this encounter.   BP 112/62 mmHg  Pulse 81  Wt 165 lb (74.844 kg)  SpO2 99% General: NAD, obese Neck: Thick, JVP not elevated, no thyromegaly or thyroid nodule.  Lungs: Crackles bilateral bases.  CV: Nondisplaced PMI.  Heart regular S1/S2, no S3/S4, no murmur.  No edema ankle edema.  No carotid bruit.  Normal pedal pulses.  Abdomen: Soft, nontender, no hepatosplenomegaly, no distention.  Skin: Intact without lesions or rashes.  Neurologic: Alert and oriented x 3.  Psych: Normal affect. Extremities: No clubbing or cyanosis.  HEENT: Normal.   Assessment/Plan: 1. Chronic systolic CHF: EF 93-79% on echo, suspected  ischemic cardiomyopathy.  No ischemia on Cardiolite.  She is not volume overloaded on exam.  NYHA class IIIb symptoms.  She is orthostatic with significant lightheadedness. Weight down 10 lbs.  - Cut Lasix back to 80 qam/40 qpm.   - Stop Imdur and stop Coreg with orthostatic hypotension.  - Repeat echo in 10/16  2. CAD: Suspected ischemic cardiomyopathy.  No active ischemia on Cardiolite.  Would not cath with severe CKD.  - She is on warfarin so not on ASA.   3. CKD: Stage IV. Followed by Dr Joelyn Oms. 4. 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  Suspect PAH component may be due to OHS/OSA, but cannot rule out a role for sarcoidosis.  High resolution CT showed no marked lung abnormalities (no evidence for pulmonary sarcoidosis).  - Should have eventual V/Q scan to rule out chronic PEs (though already anticoagulated). - Continue CPAP and home oxygen.   5. Apical thrombus: Continue warfarin.  6. Abdominal pain: Ongoing.  Has had extensive workup with GI.  ?Low flow mesenteric ischemia.  7. Orthostatic hypotension: Significant BP drop with standing.  Very weak, feels awful.  I will stop Coreg and Imdur and as above, decrease Lasix.    Followup in 1 month.     [name]  03/26/2015

## 2015-03-26 NOTE — Patient Instructions (Addendum)
Stop Isosorbide (Imdur)  Stop Carvedilol (Coreg)  Decrease Furosemide (Lasix) to 80 mg (2 tablets) in am and 40 mg ( 1 tablet) in pm.  Your physician recommends that you schedule a follow-up appointment in: 1 month

## 2015-04-01 ENCOUNTER — Encounter (HOSPITAL_COMMUNITY): Payer: Self-pay | Admitting: Emergency Medicine

## 2015-04-01 ENCOUNTER — Ambulatory Visit (INDEPENDENT_AMBULATORY_CARE_PROVIDER_SITE_OTHER): Payer: Medicare Other | Admitting: Interventional Cardiology

## 2015-04-01 ENCOUNTER — Emergency Department (HOSPITAL_COMMUNITY)
Admission: EM | Admit: 2015-04-01 | Discharge: 2015-04-01 | Disposition: A | Discharge status: Home / Self Care | Payer: Medicare Other | Source: Home / Self Care | Attending: Emergency Medicine | Admitting: Emergency Medicine

## 2015-04-01 DIAGNOSIS — Z794 Long term (current) use of insulin: Secondary | ICD-10-CM

## 2015-04-01 DIAGNOSIS — Z8701 Personal history of pneumonia (recurrent): Secondary | ICD-10-CM | POA: Insufficient documentation

## 2015-04-01 DIAGNOSIS — M1990 Unspecified osteoarthritis, unspecified site: Secondary | ICD-10-CM

## 2015-04-01 DIAGNOSIS — Z91040 Latex allergy status: Secondary | ICD-10-CM | POA: Insufficient documentation

## 2015-04-01 DIAGNOSIS — E785 Hyperlipidemia, unspecified: Secondary | ICD-10-CM | POA: Insufficient documentation

## 2015-04-01 DIAGNOSIS — I509 Heart failure, unspecified: Secondary | ICD-10-CM | POA: Insufficient documentation

## 2015-04-01 DIAGNOSIS — Z7901 Long term (current) use of anticoagulants: Secondary | ICD-10-CM

## 2015-04-01 DIAGNOSIS — R1084 Generalized abdominal pain: Secondary | ICD-10-CM

## 2015-04-01 DIAGNOSIS — J449 Chronic obstructive pulmonary disease, unspecified: Secondary | ICD-10-CM

## 2015-04-01 DIAGNOSIS — Z79899 Other long term (current) drug therapy: Secondary | ICD-10-CM

## 2015-04-01 DIAGNOSIS — R112 Nausea with vomiting, unspecified: Secondary | ICD-10-CM

## 2015-04-01 DIAGNOSIS — Z86018 Personal history of other benign neoplasm: Secondary | ICD-10-CM | POA: Insufficient documentation

## 2015-04-01 DIAGNOSIS — G4733 Obstructive sleep apnea (adult) (pediatric): Secondary | ICD-10-CM | POA: Insufficient documentation

## 2015-04-01 DIAGNOSIS — E119 Type 2 diabetes mellitus without complications: Secondary | ICD-10-CM | POA: Insufficient documentation

## 2015-04-01 DIAGNOSIS — I2510 Atherosclerotic heart disease of native coronary artery without angina pectoris: Secondary | ICD-10-CM | POA: Insufficient documentation

## 2015-04-01 DIAGNOSIS — Z86010 Personal history of colonic polyps: Secondary | ICD-10-CM

## 2015-04-01 DIAGNOSIS — Z7952 Long term (current) use of systemic steroids: Secondary | ICD-10-CM

## 2015-04-01 DIAGNOSIS — Z9889 Other specified postprocedural states: Secondary | ICD-10-CM

## 2015-04-01 DIAGNOSIS — I129 Hypertensive chronic kidney disease with stage 1 through stage 4 chronic kidney disease, or unspecified chronic kidney disease: Secondary | ICD-10-CM | POA: Insufficient documentation

## 2015-04-01 DIAGNOSIS — Z8659 Personal history of other mental and behavioral disorders: Secondary | ICD-10-CM

## 2015-04-01 DIAGNOSIS — I513 Intracardiac thrombosis, not elsewhere classified: Secondary | ICD-10-CM

## 2015-04-01 DIAGNOSIS — Z5181 Encounter for therapeutic drug level monitoring: Secondary | ICD-10-CM

## 2015-04-01 DIAGNOSIS — Z9981 Dependence on supplemental oxygen: Secondary | ICD-10-CM | POA: Insufficient documentation

## 2015-04-01 DIAGNOSIS — N184 Chronic kidney disease, stage 4 (severe): Secondary | ICD-10-CM

## 2015-04-01 DIAGNOSIS — I2109 ST elevation (STEMI) myocardial infarction involving other coronary artery of anterior wall: Secondary | ICD-10-CM

## 2015-04-01 DIAGNOSIS — K219 Gastro-esophageal reflux disease without esophagitis: Secondary | ICD-10-CM | POA: Insufficient documentation

## 2015-04-01 DIAGNOSIS — I214 Non-ST elevation (NSTEMI) myocardial infarction: Secondary | ICD-10-CM

## 2015-04-01 DIAGNOSIS — K551 Chronic vascular disorders of intestine: Secondary | ICD-10-CM | POA: Diagnosis not present

## 2015-04-01 LAB — URINALYSIS, ROUTINE W REFLEX MICROSCOPIC
Bilirubin Urine: NEGATIVE
GLUCOSE, UA: 500 mg/dL — AB
Ketones, ur: NEGATIVE mg/dL
LEUKOCYTES UA: NEGATIVE
NITRITE: NEGATIVE
PH: 7 (ref 5.0–8.0)
Protein, ur: >300 mg/dL — AB
SPECIFIC GRAVITY, URINE: 1.029 (ref 1.005–1.030)
Urobilinogen, UA: 0.2 mg/dL (ref 0.0–1.0)

## 2015-04-01 LAB — LIPASE, BLOOD: LIPASE: 21 U/L — AB (ref 22–51)

## 2015-04-01 LAB — COMPREHENSIVE METABOLIC PANEL
ALT: 15 U/L (ref 14–54)
AST: 26 U/L (ref 15–41)
Albumin: 2.8 g/dL — ABNORMAL LOW (ref 3.5–5.0)
Alkaline Phosphatase: 159 U/L — ABNORMAL HIGH (ref 38–126)
Anion gap: 11 (ref 5–15)
BUN: 37 mg/dL — AB (ref 6–20)
CHLORIDE: 101 mmol/L (ref 101–111)
CO2: 23 mmol/L (ref 22–32)
CREATININE: 3.61 mg/dL — AB (ref 0.44–1.00)
Calcium: 10.5 mg/dL — ABNORMAL HIGH (ref 8.9–10.3)
GFR calc Af Amer: 14 mL/min — ABNORMAL LOW (ref >60)
GFR, EST NON AFRICAN AMERICAN: 12 mL/min — AB (ref >60)
Glucose, Bld: 280 mg/dL — ABNORMAL HIGH (ref 65–99)
POTASSIUM: 3.7 mmol/L (ref 3.5–5.1)
SODIUM: 135 mmol/L (ref 135–145)
Total Bilirubin: 0.6 mg/dL (ref 0.3–1.2)
Total Protein: 7.1 g/dL (ref 6.5–8.1)

## 2015-04-01 LAB — CBC
HEMATOCRIT: 45.4 % (ref 36.0–46.0)
Hemoglobin: 15.2 g/dL — ABNORMAL HIGH (ref 12.0–15.0)
MCH: 29.4 pg (ref 26.0–34.0)
MCHC: 33.5 g/dL (ref 30.0–36.0)
MCV: 87.8 fL (ref 78.0–100.0)
PLATELETS: 671 10*3/uL — AB (ref 150–400)
RBC: 5.17 MIL/uL — AB (ref 3.87–5.11)
RDW: 16.4 % — AB (ref 11.5–15.5)
WBC: 11.3 10*3/uL — AB (ref 4.0–10.5)

## 2015-04-01 LAB — PROTIME-INR
INR: 2.92 — AB (ref 0.00–1.49)
Prothrombin Time: 30 seconds — ABNORMAL HIGH (ref 11.6–15.2)

## 2015-04-01 LAB — POCT INR: INR: 3.1

## 2015-04-01 LAB — URINE MICROSCOPIC-ADD ON

## 2015-04-01 MED ORDER — HYDROMORPHONE HCL 1 MG/ML IJ SOLN
1.0000 mg | Freq: Once | INTRAMUSCULAR | Status: AC
  Administered 2015-04-01: 1 mg via INTRAVENOUS
  Filled 2015-04-01: qty 1

## 2015-04-01 MED ORDER — ONDANSETRON HCL 4 MG/2ML IJ SOLN
4.0000 mg | Freq: Once | INTRAMUSCULAR | Status: AC
  Administered 2015-04-01: 4 mg via INTRAVENOUS
  Filled 2015-04-01: qty 2

## 2015-04-01 MED ORDER — ONDANSETRON 4 MG PO TBDP
ORAL_TABLET | ORAL | Status: AC
  Filled 2015-04-01: qty 1

## 2015-04-01 MED ORDER — SODIUM CHLORIDE 0.9 % IV BOLUS (SEPSIS)
500.0000 mL | Freq: Once | INTRAVENOUS | Status: AC
  Administered 2015-04-01: 500 mL via INTRAVENOUS

## 2015-04-01 MED ORDER — ONDANSETRON 4 MG PO TBDP
4.0000 mg | ORAL_TABLET | Freq: Once | ORAL | Status: AC | PRN
  Administered 2015-04-01: 4 mg via ORAL

## 2015-04-01 NOTE — Discharge Instructions (Signed)

## 2015-04-01 NOTE — ED Notes (Signed)
Pt sts abd pain with N/V x several months worse over last 2 days

## 2015-04-01 NOTE — ED Provider Notes (Signed)
CSN: [id number]     Arrival date & time 04/01/15  1149 History   First MD Initiated Contact with Patient 04/01/15 1356     Chief Complaint  Patient presents with  . Abdominal Pain  . Emesis     (Consider location/radiation/quality/duration/timing/severity/associated sxs/prior Treatment) HPI Comments: Patient presents with abdominal pain associated vomiting. She's had recurrent episodes of abdominal pain with associated vomiting since May. She's had several hospitalizations for this. It was initially felt to be possible mesenteric ischemia. She recently was noted on August 7 for the symptoms. She was discharged on August 14. During that stay she had an MRI/MRA which did not show any evidence of mesenteric arterial disease. She had another recent admission about a week before that in August. She states she is in the same symptoms that she's had in the past. She reports crampy diffuse abdominal pain. She's had associated nausea and vomiting. This started last night and got worse today. She denies any known fevers. She has little bit of pressure when she urinates. She had one loose stool yesterday but no other diarrhea. She was tested for C. difficile and this is been negative. She's had multiple CT scans since May which have been negative.  Patient is a 69 y.o. female presenting with abdominal pain and vomiting.  Abdominal Pain Associated symptoms: nausea and vomiting   Associated symptoms: no chest pain, no chills, no cough, no diarrhea, no fatigue, no fever, no hematuria and no shortness of breath   Emesis Associated symptoms: abdominal pain   Associated symptoms: no arthralgias, no chills, no diarrhea and no headaches     Past Medical History  Diagnosis Date  . Essential hypertension, benign   . Coronary atherosclerosis of native coronary artery 01/04/2006    Tiny OM1 70-90% ostial stenosis, no other CAD  . COPD (chronic obstructive pulmonary disease)   . Esophageal reflux   . Dyslipidemia    . Gout   . Colon polyps 2011    Tubular adenomatous polyps  . Spinal stenosis of lumbar region     chronic low back pain.   . Bulging lumbar disc   . Chronic diastolic heart failure   . PNA (pneumonia) 2012    With pleural effusion, requiring thoracentesis  . Chronic bronchitis   . On home oxygen therapy   . OSA (obstructive sleep apnea)     Marginally compliant with CPAP  . Type II diabetes mellitus   . Arthritis   . Diverticulosis   . GERD (gastroesophageal reflux disease)   . Anxiety   . Hyperlipidemia   . Sarcoidosis of lung   . CKD (chronic kidney disease) stage 4, GFR 15-29 ml/min   . IBS (irritable bowel syndrome)   . Cataracts, both eyes   . Headache(784.0)   . Lactic acidosis 01/21/2015  . Chronic CHF (congestive heart failure)    Past Surgical History  Procedure Laterality Date  . Vesicovaginal fistula closure w/  total abdominal hysterectomy  1992  . Bilateral total knee replacements Bilateral Rt=5/04 & Lft=1/09    by DrAlusio  . 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 Zella Richer  . Appendectomy  1957  . Tonsillectomy  1968  . Cholecystectomy  1980's  . Abdominal hysterectomy  1992  . Reduction mammaplasty Bilateral 1980's  . Cardiac catheterization  1990's  . Thoracentesis  2012  . Joint replacement    . Av fistula placement Left 12/31/2013    Procedure: ARTERIOVENOUS (AV) FISTULA CREATION;  Surgeon: Juanda Crumble  Antony Blackbird, MD;  Location: [facility];  Service: Vascular;  Laterality: Left;  . Colonoscopy w/ biopsies and polypectomy    . Av fistula placement Left 03/18/2014    Procedure: ARTERIOVENOUS (AV) FISTULA CREATION- LEFT BRACHIOCEPHALIC ;  Surgeon: Elam Dutch, MD;  Location: [facility] OR;  Service: Vascular;  Laterality: Left;  . Ligation of arteriovenous  fistula Left 03/18/2014    Procedure: LIGATION OF ARTERIOVENOUS  FISTULA- LEFT RADIOCEPHALIC;  Surgeon: Elam Dutch, MD;  Location: [facility];  Service: Vascular;  Laterality: Left;  . Cardiac catheterization N/A  01/27/2015    Procedure: Right Heart Cath;  Surgeon: Larey Dresser, MD;  Location: [facility];  Service: Cardiovascular;  Laterality: N/A;  . Esophagogastroduodenoscopy (egd) with propofol N/A 03/14/2015    Procedure: ESOPHAGOGASTRODUODENOSCOPY (EGD) WITH PROPOFOL;  Surgeon: Gatha Mayer, MD;  Location: [facility];  Service: Endoscopy;  Laterality: N/A;  . Colonoscopy with propofol N/A 03/14/2015    Procedure: COLONOSCOPY WITH PROPOFOL;  Surgeon: Gatha Mayer, MD;  Location: [facility];  Service: Endoscopy;  Laterality: N/A;   Family History  Problem Relation Age of Onset  . Diabetes Mother   . Heart disease Mother   . Hyperlipidemia Mother   . Varicose Veins Mother   . Breast cancer Maternal Aunt   . Heart disease Father   . Deep vein thrombosis Father   . Hyperlipidemia Father   . Heart disease Sister     before age 75  . Cancer Sister   . Diabetes Sister   . Hyperlipidemia Sister   . Heart attack Sister   . Heart disease Brother   . Diabetes Brother   . Hyperlipidemia Brother   . Heart attack Brother   . Hyperlipidemia Sister    Social History  Substance Use Topics  . Smoking status: Never Smoker   . Smokeless tobacco: Never Used  . Alcohol Use: No   OB History    No data available     Review of Systems  Constitutional: Negative for fever, chills, diaphoresis and fatigue.  HENT: Negative for congestion, rhinorrhea and sneezing.   Eyes: Negative.   Respiratory: Negative for cough, chest tightness and shortness of breath.   Cardiovascular: Negative for chest pain and leg swelling.  Gastrointestinal: Positive for nausea, vomiting and abdominal pain. Negative for diarrhea and blood in stool.  Genitourinary: Negative for frequency, hematuria, flank pain and difficulty urinating.  Musculoskeletal: Negative for back pain and arthralgias.  Skin: Negative for rash.  Neurological: Negative for dizziness, speech difficulty, weakness, numbness and headaches.       Allergies  Adhesive; Avelox; Codeine; Guaifenesin; Latex; and Oxycodone  Home Medications   Prior to Admission medications   Medication Sig Start Date End Date Taking? Authorizing Provider  acetaminophen (TYLENOL) 500 MG tablet Take 1,000 mg by mouth 2 (two) times daily as needed (pain).    Yes Historical Provider, MD  albuterol (PROVENTIL HFA;VENTOLIN HFA) 108 (90 BASE) MCG/ACT inhaler Inhale 2 puffs into the lungs every 4 (four) hours as needed for wheezing or shortness of breath.    Yes Historical Provider, MD  Apremilast (OTEZLA) 30 MG TABS Take 30 mg by mouth daily.    Yes Historical Provider, MD  atorvastatin (LIPITOR) 20 MG tablet Take 20 mg by mouth at bedtime.   Yes Historical Provider, MD  esomeprazole (NEXIUM) 40 MG capsule Take 1 capsule by mouth 30 min before breakfast. 03/03/15  Yes Amy S Esterwood, PA-C  famotidine (PEPCID) 20 MG  tablet Take 1 tablet (20 mg total) by mouth daily. 02/02/15  Yes Ripudeep Krystal Eaton, MD  ferrous sulfate 325 (65 FE) MG tablet Take 325 mg by mouth daily with breakfast.   Yes Historical Provider, MD  fluticasone (FLONASE) 50 MCG/ACT nasal spray Place 2 sprays into both nostrils daily as needed for allergies or rhinitis.    Yes Historical Provider, MD  Fluticasone-Salmeterol (ADVAIR) 250-50 MCG/DOSE AEPB Inhale 1 puff into the lungs 2 (two) times daily. 09/25/14  Yes Noralee Space, MD  furosemide (LASIX) 80 MG tablet Take 80 mg (2 tablets) in am and 40 mg (1 tablet) in pm 03/26/15  Yes Larey Dresser, MD  insulin lispro (HUMALOG KWIKPEN) 100 UNIT/ML KiwkPen Inject 0.01-0.1 mLs (1-10 Units total) into the skin 3 (three) times daily. Sliding scale CBG 70 - 120: 0 units CBG 121 - 150: 1 unit,  CBG 151 - 200: 2 units,  CBG 201 - 250: 3 units,  CBG 251 - 300: 5 units,  CBG 301 - 350: 7 units,  CBG 351 - 400: 9 units   CBG > 400: 10 units and notify your MD 02/02/15  Yes Ripudeep Krystal Eaton, MD  Multiple Vitamins-Minerals (MULTIVITAMIN WITH MINERALS) tablet Take 1  tablet by mouth daily.   Yes Historical Provider, MD  ondansetron (ZOFRAN) 4 MG tablet Take 1 tablet (4 mg total) by mouth every 8 (eight) hours as needed for nausea or vomiting. 02/28/15  Yes Amy S Esterwood, PA-C  promethazine (PHENERGAN) 25 MG tablet Take 1 tablet (25 mg total) by mouth every 4 (four) hours as needed for nausea or vomiting. 02/02/15  Yes Ripudeep Krystal Eaton, MD  saccharomyces boulardii (FLORASTOR) 250 MG capsule Take 1 capsule (250 mg total) by mouth 2 (two) times daily. 02/02/15  Yes Ripudeep Krystal Eaton, MD  sodium bicarbonate 650 MG tablet Take 1 tablet (650 mg total) by mouth 2 (two) times daily. 02/02/15  Yes Ripudeep Krystal Eaton, MD  TOUJEO SOLOSTAR 300 UNIT/ML SOPN Inject 40 Units into the skin every morning. 11/21/14  Yes Historical Provider, MD  triamcinolone cream (KENALOG) 0.1 % Apply 1 application topically 2 (two) times daily.   Yes Historical Provider, MD  warfarin (COUMADIN) 1 MG tablet Take 1.5 tablets (1.5 mg total) by mouth every evening. Take 3 mg today -03/06/15-and then resume 1.5 mg from 8/5 03/06/15  Yes Shanker Kristeen Mans, MD  albuterol (PROVENTIL) (2.5 MG/3ML) 0.083% nebulizer solution Take 2.5 mg by nebulization every 2 (two) hours as needed for wheezing or shortness of breath.    Historical Provider, MD  Alpha-D-Galactosidase (BEANO PO) Take 1 tablet by mouth daily.    Historical Provider, MD  feeding supplement (BOOST / RESOURCE BREEZE) LIQD Take 1 Container by mouth 3 (three) times daily between meals. 03/06/15   Shanker Kristeen Mans, MD  hydrocortisone (ANUSOL-HC) 2.5 % rectal cream Place 1 application rectally 4 (four) times daily. 02/28/15   Amy S Esterwood, PA-C  loperamide (IMODIUM) 2 MG capsule Take 2 capsules (4 mg total) by mouth daily. 03/16/15   Oswald Hillock, MD   BP 141/95 mmHg  Pulse 75  Temp(Src) 97.9 F (36.6 C) (Oral)  Resp 18  Ht 5\' 2"  (1.575 m)  Wt 163 lb (73.936 kg)  BMI 29.81 kg/m2  SpO2 95% Physical Exam  Constitutional: She is oriented to person, place, and  time. She appears well-developed and well-nourished.  HENT:  Head: Normocephalic and atraumatic.  Eyes: Pupils are equal, round, and reactive to light.  Neck: Normal range of motion. Neck supple.  Cardiovascular: Normal rate, regular rhythm and normal heart sounds.   Pulmonary/Chest: Effort normal and breath sounds normal. No respiratory distress. She has no wheezes. She has no rales. She exhibits no tenderness.  Abdominal: Soft. Bowel sounds are normal. There is tenderness (moderate diffuse tenderness). There is no rebound and no guarding.  Musculoskeletal: Normal range of motion. She exhibits no edema.  Lymphadenopathy:    She has no cervical adenopathy.  Neurological: She is alert and oriented to person, place, and time.  Skin: Skin is warm and dry. No rash noted.  Psychiatric: She has a normal mood and affect.    ED Course  Procedures (including critical care time) Labs Review Labs Reviewed  LIPASE, BLOOD - Abnormal; Notable for the following:    Lipase 21 (*)    All other components within normal limits  COMPREHENSIVE METABOLIC PANEL - Abnormal; Notable for the following:    Glucose, Bld 280 (*)    BUN 37 (*)    Creatinine, Ser 3.61 (*)    Calcium 10.5 (*)    Albumin 2.8 (*)    Alkaline Phosphatase 159 (*)    GFR calc non Af Amer 12 (*)    GFR calc Af Amer 14 (*)    All other components within normal limits  CBC - Abnormal; Notable for the following:    WBC 11.3 (*)    RBC 5.17 (*)    Hemoglobin 15.2 (*)    RDW 16.4 (*)    Platelets 671 (*)    All other components within normal limits  PROTIME-INR - Abnormal; Notable for the following:    Prothrombin Time 30.0 (*)    INR 2.92 (*)    All other components within normal limits  URINALYSIS, ROUTINE W REFLEX MICROSCOPIC (NOT AT [facility])    Imaging Review No results found. I have personally reviewed and evaluated these images and lab results as part of my medical decision-making.   EKG Interpretation None      MDM    Final diagnoses:  Generalized abdominal pain    Patient presents with abdominal pain. She's had an extensive workup for this in the past. She did have a recent MRI which did not show evidence of arterial occlusion. She's had multiple CT scans. I don't feel that any further imaging is indicated today. Her labs are similar to baseline values. She has some chronic elevation in her alkaline phosphatase. She was given 1 dose of Dilaudid and Zofran. She's feeling much better with no ongoing vomiting. Her abdominal exam is improved. She is currently doing a by mouth challenge. She tolerates this, I feel that she came be discharged home with follow-up with her gastroenterologist.    Malvin Johns, MD 04/01/15 [phone]

## 2015-04-02 ENCOUNTER — Inpatient Hospital Stay (HOSPITAL_COMMUNITY)
Admission: EM | Admit: 2015-04-02 | Discharge: 2015-04-06 | DRG: 394 | Disposition: A | Discharge status: Home Health | Payer: Medicare Other | Attending: Internal Medicine | Admitting: Internal Medicine

## 2015-04-02 ENCOUNTER — Encounter (HOSPITAL_COMMUNITY): Payer: Self-pay | Admitting: Emergency Medicine

## 2015-04-02 DIAGNOSIS — E785 Hyperlipidemia, unspecified: Secondary | ICD-10-CM | POA: Diagnosis present

## 2015-04-02 DIAGNOSIS — E1122 Type 2 diabetes mellitus with diabetic chronic kidney disease: Secondary | ICD-10-CM | POA: Diagnosis present

## 2015-04-02 DIAGNOSIS — E860 Dehydration: Secondary | ICD-10-CM | POA: Diagnosis not present

## 2015-04-02 DIAGNOSIS — D869 Sarcoidosis, unspecified: Secondary | ICD-10-CM | POA: Diagnosis present

## 2015-04-02 DIAGNOSIS — E440 Moderate protein-calorie malnutrition: Secondary | ICD-10-CM | POA: Diagnosis present

## 2015-04-02 DIAGNOSIS — G8929 Other chronic pain: Secondary | ICD-10-CM | POA: Diagnosis present

## 2015-04-02 DIAGNOSIS — Z90710 Acquired absence of both cervix and uterus: Secondary | ICD-10-CM

## 2015-04-02 DIAGNOSIS — N184 Chronic kidney disease, stage 4 (severe): Secondary | ICD-10-CM | POA: Diagnosis present

## 2015-04-02 DIAGNOSIS — Z9981 Dependence on supplemental oxygen: Secondary | ICD-10-CM | POA: Diagnosis not present

## 2015-04-02 DIAGNOSIS — I5042 Chronic combined systolic (congestive) and diastolic (congestive) heart failure: Secondary | ICD-10-CM | POA: Diagnosis present

## 2015-04-02 DIAGNOSIS — J449 Chronic obstructive pulmonary disease, unspecified: Secondary | ICD-10-CM | POA: Diagnosis present

## 2015-04-02 DIAGNOSIS — Z7901 Long term (current) use of anticoagulants: Secondary | ICD-10-CM

## 2015-04-02 DIAGNOSIS — K551 Chronic vascular disorders of intestine: Secondary | ICD-10-CM | POA: Diagnosis present

## 2015-04-02 DIAGNOSIS — G43A0 Cyclical vomiting, not intractable: Secondary | ICD-10-CM

## 2015-04-02 DIAGNOSIS — E876 Hypokalemia: Secondary | ICD-10-CM | POA: Diagnosis present

## 2015-04-02 DIAGNOSIS — E1129 Type 2 diabetes mellitus with other diabetic kidney complication: Secondary | ICD-10-CM | POA: Diagnosis present

## 2015-04-02 DIAGNOSIS — R1084 Generalized abdominal pain: Secondary | ICD-10-CM | POA: Diagnosis not present

## 2015-04-02 DIAGNOSIS — I10 Essential (primary) hypertension: Secondary | ICD-10-CM | POA: Diagnosis present

## 2015-04-02 DIAGNOSIS — Z833 Family history of diabetes mellitus: Secondary | ICD-10-CM | POA: Diagnosis not present

## 2015-04-02 DIAGNOSIS — R112 Nausea with vomiting, unspecified: Secondary | ICD-10-CM | POA: Diagnosis present

## 2015-04-02 DIAGNOSIS — N179 Acute kidney failure, unspecified: Secondary | ICD-10-CM | POA: Diagnosis present

## 2015-04-02 DIAGNOSIS — E7800 Pure hypercholesterolemia, unspecified: Secondary | ICD-10-CM | POA: Diagnosis present

## 2015-04-02 DIAGNOSIS — R109 Unspecified abdominal pain: Secondary | ICD-10-CM | POA: Diagnosis present

## 2015-04-02 DIAGNOSIS — Z8249 Family history of ischemic heart disease and other diseases of the circulatory system: Secondary | ICD-10-CM | POA: Diagnosis not present

## 2015-04-02 DIAGNOSIS — Z6827 Body mass index (BMI) 27.0-27.9, adult: Secondary | ICD-10-CM

## 2015-04-02 DIAGNOSIS — J9610 Chronic respiratory failure, unspecified whether with hypoxia or hypercapnia: Secondary | ICD-10-CM | POA: Diagnosis present

## 2015-04-02 DIAGNOSIS — Z96653 Presence of artificial knee joint, bilateral: Secondary | ICD-10-CM | POA: Diagnosis present

## 2015-04-02 DIAGNOSIS — N186 End stage renal disease: Secondary | ICD-10-CM | POA: Diagnosis present

## 2015-04-02 DIAGNOSIS — I129 Hypertensive chronic kidney disease with stage 1 through stage 4 chronic kidney disease, or unspecified chronic kidney disease: Secondary | ICD-10-CM | POA: Diagnosis present

## 2015-04-02 DIAGNOSIS — D631 Anemia in chronic kidney disease: Secondary | ICD-10-CM | POA: Diagnosis present

## 2015-04-02 LAB — COMPREHENSIVE METABOLIC PANEL
ALBUMIN: 3.3 g/dL — AB (ref 3.5–5.0)
ALK PHOS: 172 U/L — AB (ref 38–126)
ALT: 41 U/L (ref 14–54)
ANION GAP: 16 — AB (ref 5–15)
AST: 100 U/L — ABNORMAL HIGH (ref 15–41)
BUN: 45 mg/dL — ABNORMAL HIGH (ref 6–20)
CHLORIDE: 97 mmol/L — AB (ref 101–111)
CO2: 18 mmol/L — AB (ref 22–32)
CREATININE: 4.88 mg/dL — AB (ref 0.44–1.00)
Calcium: 9.8 mg/dL (ref 8.9–10.3)
GFR calc non Af Amer: 8 mL/min — ABNORMAL LOW (ref >60)
GFR, EST AFRICAN AMERICAN: 10 mL/min — AB (ref >60)
GLUCOSE: 191 mg/dL — AB (ref 65–99)
Potassium: 4.2 mmol/L (ref 3.5–5.1)
SODIUM: 131 mmol/L — AB (ref 135–145)
Total Bilirubin: 1 mg/dL (ref 0.3–1.2)
Total Protein: 7.1 g/dL (ref 6.5–8.1)

## 2015-04-02 LAB — PROTIME-INR
INR: 3.87 — AB (ref 0.00–1.49)
Prothrombin Time: 37.1 seconds — ABNORMAL HIGH (ref 11.6–15.2)

## 2015-04-02 LAB — LIPASE, BLOOD: Lipase: 21 U/L — ABNORMAL LOW (ref 22–51)

## 2015-04-02 LAB — CBC WITH DIFFERENTIAL/PLATELET
Basophils Absolute: 0 10*3/uL (ref 0.0–0.1)
Basophils Relative: 0 % (ref 0–1)
Eosinophils Absolute: 0 10*3/uL (ref 0.0–0.7)
Eosinophils Relative: 0 % (ref 0–5)
HEMATOCRIT: 46.2 % — AB (ref 36.0–46.0)
HEMOGLOBIN: 15.4 g/dL — AB (ref 12.0–15.0)
LYMPHS ABS: 3.7 10*3/uL (ref 0.7–4.0)
Lymphocytes Relative: 29 % (ref 12–46)
MCH: 29.4 pg (ref 26.0–34.0)
MCHC: 33.3 g/dL (ref 30.0–36.0)
MCV: 88.3 fL (ref 78.0–100.0)
MONO ABS: 1.5 10*3/uL — AB (ref 0.1–1.0)
MONOS PCT: 12 % (ref 3–12)
NEUTROS ABS: 7.8 10*3/uL — AB (ref 1.7–7.7)
NEUTROS PCT: 59 % (ref 43–77)
Platelets: 595 10*3/uL — ABNORMAL HIGH (ref 150–400)
RBC: 5.23 MIL/uL — ABNORMAL HIGH (ref 3.87–5.11)
RDW: 16.6 % — AB (ref 11.5–15.5)
WBC: 13.1 10*3/uL — ABNORMAL HIGH (ref 4.0–10.5)

## 2015-04-02 LAB — URINALYSIS, ROUTINE W REFLEX MICROSCOPIC
Bilirubin Urine: NEGATIVE
GLUCOSE, UA: 100 mg/dL — AB
Ketones, ur: NEGATIVE mg/dL
LEUKOCYTES UA: NEGATIVE
Nitrite: NEGATIVE
PH: 7 (ref 5.0–8.0)
Protein, ur: 100 mg/dL — AB
SPECIFIC GRAVITY, URINE: 1.008 (ref 1.005–1.030)
Urobilinogen, UA: 0.2 mg/dL (ref 0.0–1.0)

## 2015-04-02 LAB — GLUCOSE, CAPILLARY
Glucose-Capillary: 162 mg/dL — ABNORMAL HIGH (ref 65–99)
Glucose-Capillary: 118 mg/dL — ABNORMAL HIGH (ref 65–99)

## 2015-04-02 LAB — URINE MICROSCOPIC-ADD ON

## 2015-04-02 MED ORDER — LACTATED RINGERS IV SOLN: INTRAVENOUS | Status: DC

## 2015-04-02 MED ORDER — ALBUTEROL SULFATE HFA 108 (90 BASE) MCG/ACT IN AERS: 2.0000 | INHALATION_SPRAY | RESPIRATORY_TRACT | Status: DC | PRN

## 2015-04-02 MED ORDER — SODIUM BICARBONATE 650 MG PO TABS
650.0000 mg | ORAL_TABLET | Freq: Two times a day (BID) | ORAL | Status: DC
  Administered 2015-04-03 – 2015-04-06 (×7): 650 mg via ORAL
  Filled 2015-04-02 (×9): qty 1

## 2015-04-02 MED ORDER — ALBUTEROL SULFATE (2.5 MG/3ML) 0.083% IN NEBU: 2.5000 mg | INHALATION_SOLUTION | RESPIRATORY_TRACT | Status: DC | PRN

## 2015-04-02 MED ORDER — WARFARIN - PHARMACIST DOSING INPATIENT: Freq: Every day | Status: DC

## 2015-04-02 MED ORDER — PROMETHAZINE HCL 25 MG/ML IJ SOLN: 6.2500 mg | INTRAMUSCULAR | Status: DC | PRN

## 2015-04-02 MED ORDER — FLUTICASONE PROPIONATE 50 MCG/ACT NA SUSP
2.0000 | Freq: Every day | NASAL | Status: DC | PRN
Start: 2015-04-02 — End: 2015-04-06
  Administered 2015-04-04: 2 via NASAL
  Filled 2015-04-02 (×2): qty 16

## 2015-04-02 MED ORDER — ONDANSETRON HCL 4 MG/2ML IJ SOLN
4.0000 mg | Freq: Once | INTRAMUSCULAR | Status: AC
  Administered 2015-04-02: 4 mg via INTRAVENOUS
  Filled 2015-04-02: qty 2

## 2015-04-02 MED ORDER — ATORVASTATIN CALCIUM 20 MG PO TABS
20.0000 mg | ORAL_TABLET | Freq: Every day | ORAL | Status: DC
Start: 2015-04-02 — End: 2015-04-06
  Administered 2015-04-03 – 2015-04-05 (×3): 20 mg via ORAL
  Filled 2015-04-02 (×5): qty 1

## 2015-04-02 MED ORDER — SODIUM CHLORIDE 0.9 % IV SOLN
INTRAVENOUS | Status: DC
  Administered 2015-04-03 – 2015-04-05 (×4): via INTRAVENOUS
  Administered 2015-04-05: 1000 mL via INTRAVENOUS

## 2015-04-02 MED ORDER — HYDROMORPHONE HCL 1 MG/ML IJ SOLN
1.0000 mg | INTRAMUSCULAR | Status: DC | PRN
  Administered 2015-04-02 – 2015-04-03 (×3): 1 mg via INTRAVENOUS
  Filled 2015-04-02 (×3): qty 1

## 2015-04-02 MED ORDER — PROMETHAZINE HCL 25 MG/ML IJ SOLN
12.5000 mg | Freq: Once | INTRAMUSCULAR | Status: AC
  Administered 2015-04-03: 12.5 mg via INTRAVENOUS
  Filled 2015-04-02: qty 1

## 2015-04-02 MED ORDER — DICYCLOMINE HCL 10 MG PO CAPS
10.0000 mg | ORAL_CAPSULE | Freq: Three times a day (TID) | ORAL | Status: DC
  Administered 2015-04-03 – 2015-04-06 (×14): 10 mg via ORAL
  Filled 2015-04-02 (×23): qty 1

## 2015-04-02 MED ORDER — HYDROCODONE-ACETAMINOPHEN 5-325 MG PO TABS
1.0000 | ORAL_TABLET | ORAL | Status: DC | PRN
  Administered 2015-04-04 – 2015-04-06 (×5): 1 via ORAL
  Filled 2015-04-02 (×5): qty 1

## 2015-04-02 MED ORDER — SODIUM CHLORIDE 0.9 % IV SOLN
INTRAVENOUS | Status: DC
  Administered 2015-04-02: 15:00:00 via INTRAVENOUS

## 2015-04-02 MED ORDER — MEPERIDINE HCL 50 MG/ML IJ SOLN: 6.2500 mg | INTRAMUSCULAR | Status: DC | PRN

## 2015-04-02 MED ORDER — INSULIN ASPART 100 UNIT/ML ~~LOC~~ SOLN
0.0000 [IU] | Freq: Every day | SUBCUTANEOUS | Status: DC
  Administered 2015-04-05: 1 [IU] via SUBCUTANEOUS

## 2015-04-02 MED ORDER — INSULIN ASPART 100 UNIT/ML ~~LOC~~ SOLN
0.0000 [IU] | Freq: Three times a day (TID) | SUBCUTANEOUS | Status: DC
  Administered 2015-04-02: 2 [IU] via SUBCUTANEOUS

## 2015-04-02 MED ORDER — HYDROCORTISONE 2.5 % RE CREA
1.0000 "application " | TOPICAL_CREAM | Freq: Four times a day (QID) | RECTAL | Status: DC
  Administered 2015-04-04: 1 via RECTAL
  Filled 2015-04-02: qty 28.35

## 2015-04-02 MED ORDER — LORAZEPAM 2 MG/ML IJ SOLN
0.5000 mg | Freq: Once | INTRAMUSCULAR | Status: AC
  Administered 2015-04-02: 0.5 mg via INTRAVENOUS
  Filled 2015-04-02: qty 1

## 2015-04-02 MED ORDER — ONDANSETRON HCL 4 MG/2ML IJ SOLN
4.0000 mg | Freq: Four times a day (QID) | INTRAMUSCULAR | Status: DC | PRN
  Administered 2015-04-02 – 2015-04-06 (×5): 4 mg via INTRAVENOUS
  Filled 2015-04-02 (×5): qty 2

## 2015-04-02 MED ORDER — SACCHAROMYCES BOULARDII 250 MG PO CAPS
250.0000 mg | ORAL_CAPSULE | Freq: Two times a day (BID) | ORAL | Status: DC
  Administered 2015-04-03 – 2015-04-06 (×7): 250 mg via ORAL
  Filled 2015-04-02 (×9): qty 1

## 2015-04-02 MED ORDER — CARVEDILOL 3.125 MG PO TABS
3.1250 mg | ORAL_TABLET | Freq: Two times a day (BID) | ORAL | Status: DC
  Administered 2015-04-03 – 2015-04-06 (×7): 3.125 mg via ORAL
  Filled 2015-04-02 (×12): qty 1

## 2015-04-02 MED ORDER — APREMILAST 30 MG PO TABS: 30.0000 mg | ORAL_TABLET | Freq: Every day | ORAL | Status: DC

## 2015-04-02 MED ORDER — SODIUM CHLORIDE 0.9 % IV BOLUS (SEPSIS)
1000.0000 mL | Freq: Once | INTRAVENOUS | Status: AC
  Administered 2015-04-02: 1000 mL via INTRAVENOUS

## 2015-04-02 MED ORDER — ONDANSETRON HCL 4 MG PO TABS: 4.0000 mg | ORAL_TABLET | Freq: Four times a day (QID) | ORAL | Status: DC | PRN

## 2015-04-02 MED ORDER — ACETAMINOPHEN 650 MG RE SUPP: 650.0000 mg | Freq: Four times a day (QID) | RECTAL | Status: DC | PRN

## 2015-04-02 MED ORDER — HYDRALAZINE HCL 20 MG/ML IJ SOLN
10.0000 mg | Freq: Four times a day (QID) | INTRAMUSCULAR | Status: DC | PRN
  Administered 2015-04-02: 10 mg via INTRAVENOUS
  Filled 2015-04-02 (×2): qty 1

## 2015-04-02 MED ORDER — INSULIN GLARGINE 100 UNIT/ML ~~LOC~~ SOLN
20.0000 [IU] | Freq: Every day | SUBCUTANEOUS | Status: DC
  Administered 2015-04-03 – 2015-04-06 (×4): 20 [IU] via SUBCUTANEOUS
  Filled 2015-04-02 (×5): qty 0.2

## 2015-04-02 MED ORDER — ACETAMINOPHEN 325 MG PO TABS: 650.0000 mg | ORAL_TABLET | Freq: Four times a day (QID) | ORAL | Status: DC | PRN

## 2015-04-02 MED ORDER — HYDROMORPHONE HCL 1 MG/ML IJ SOLN
1.0000 mg | Freq: Once | INTRAMUSCULAR | Status: AC
  Administered 2015-04-02: 1 mg via INTRAVENOUS
  Filled 2015-04-02: qty 1

## 2015-04-02 NOTE — ED Notes (Signed)
Patient to ED by [facility] c/o generalized abdominal pain, nausea and vomiting for the past 2 days. Patient states that she vomited once today and had diarrhea this morning, no blood noted in stool. She has taken oral Zofran with no relief. Pain is worse with food. Patient reports that she was seen at [facility] yesterday for same complaint.

## 2015-04-02 NOTE — Progress Notes (Addendum)
Mathews notified Cyril Mourning of [facility] [phone]  and Zenaida Deed of [facility]  of pt admit so pt can be followed for assessment and any d/c needs while hospitalized  1513 Pt had appt with Dr Chapman Fitch last week after d/c from hospital Pt was followed by [facility] and last saw [facility] on 04/01/15 Pt also scheduled to see HHPT Pt with female family at bedside

## 2015-04-02 NOTE — ED Provider Notes (Signed)
CSN: [id number]     Arrival date & time 04/02/15  1223 History   First MD Initiated Contact with Patient 04/02/15 1251     Chief Complaint  Patient presents with  . Abdominal Pain     (Consider location/radiation/quality/duration/timing/severity/associated sxs/prior Treatment) HPI Comments: Patient here with worsening chronic abdominal pain that began today after she ate food. Seen here yesterday for similar symptoms and was treated symptomatically feels better. She's had recent admissions for similar symptoms including multiple imaging studies. Today symptoms are similar to her prior ones consisting of diffuse abdominal pain that was worse with eating. Denies any fever or chills. No hematemesis. No black or bloody stools. Denies any urinary symptoms. Symptoms persistent and nothing makes them better. Patient used Zofran without relief  Patient is a 69 y.o. female presenting with abdominal pain. The history is provided by the patient.  Abdominal Pain   Past Medical History  Diagnosis Date  . Essential hypertension, benign   . Coronary atherosclerosis of native coronary artery 01/04/2006    Tiny OM1 70-90% ostial stenosis, no other CAD  . COPD (chronic obstructive pulmonary disease)   . Esophageal reflux   . Dyslipidemia   . Gout   . Colon polyps 2011    Tubular adenomatous polyps  . Spinal stenosis of lumbar region     chronic low back pain.   . Bulging lumbar disc   . Chronic diastolic heart failure   . PNA (pneumonia) 2012    With pleural effusion, requiring thoracentesis  . Chronic bronchitis   . On home oxygen therapy   . OSA (obstructive sleep apnea)     Marginally compliant with CPAP  . Type II diabetes mellitus   . Arthritis   . Diverticulosis   . GERD (gastroesophageal reflux disease)   . Anxiety   . Hyperlipidemia   . Sarcoidosis of lung   . CKD (chronic kidney disease) stage 4, GFR 15-29 ml/min   . IBS (irritable bowel syndrome)   . Cataracts, both eyes   .  Headache(784.0)   . Lactic acidosis 01/21/2015  . Chronic CHF (congestive heart failure)    Past Surgical History  Procedure Laterality Date  . Vesicovaginal fistula closure w/  total abdominal hysterectomy  1992  . Bilateral total knee replacements Bilateral Rt=5/04 & Lft=1/09    by DrAlusio  . 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 Zella Richer  . Appendectomy  1957  . Tonsillectomy  1968  . Cholecystectomy  1980's  . Abdominal hysterectomy  1992  . Reduction mammaplasty Bilateral 1980's  . Cardiac catheterization  1990's  . Thoracentesis  2012  . Joint replacement    . Av fistula placement Left 12/31/2013    Procedure: ARTERIOVENOUS (AV) FISTULA CREATION;  Surgeon: Elam Dutch, MD;  Location: [place];  Service: Vascular;  Laterality: Left;  . Colonoscopy w/ biopsies and polypectomy    . Av fistula placement Left 03/18/2014    Procedure: ARTERIOVENOUS (AV) FISTULA CREATION- LEFT BRACHIOCEPHALIC ;  Surgeon: Elam Dutch, MD;  Location: [facility];  Service: Vascular;  Laterality: Left;  . Ligation of arteriovenous  fistula Left 03/18/2014    Procedure: LIGATION OF ARTERIOVENOUS  FISTULA- LEFT RADIOCEPHALIC;  Surgeon: Elam Dutch, MD;  Location: [place];  Service: Vascular;  Laterality: Left;  . Cardiac catheterization N/A 01/27/2015    Procedure: Right Heart Cath;  Surgeon: Larey Dresser, MD;  Location: [place] [facility];  Service: Cardiovascular;  Laterality: N/A;  .  Esophagogastroduodenoscopy (egd) with propofol N/A 03/14/2015    Procedure: ESOPHAGOGASTRODUODENOSCOPY (EGD) WITH PROPOFOL;  Surgeon: Gatha Mayer, MD;  Location: [place];  Service: Endoscopy;  Laterality: N/A;  . Colonoscopy with propofol N/A 03/14/2015    Procedure: COLONOSCOPY WITH PROPOFOL;  Surgeon: Gatha Mayer, MD;  Location: [place];  Service: Endoscopy;  Laterality: N/A;   Family History  Problem Relation Age of Onset  . Diabetes Mother   . Heart disease Mother   . Hyperlipidemia Mother   .  Varicose Veins Mother   . Breast cancer Maternal Aunt   . Heart disease Father   . Deep vein thrombosis Father   . Hyperlipidemia Father   . Heart disease Sister     before age 31  . Cancer Sister   . Diabetes Sister   . Hyperlipidemia Sister   . Heart attack Sister   . Heart disease Brother   . Diabetes Brother   . Hyperlipidemia Brother   . Heart attack Brother   . Hyperlipidemia Sister    Social History  Substance Use Topics  . Smoking status: Never Smoker   . Smokeless tobacco: Never Used  . Alcohol Use: No   OB History    No data available     Review of Systems  Gastrointestinal: Positive for abdominal pain.  All other systems reviewed and are negative.     Allergies  Adhesive; Avelox; Codeine; Guaifenesin; Latex; and Oxycodone  Home Medications   Prior to Admission medications   Medication Sig Start Date End Date Taking? Authorizing Provider  acetaminophen (TYLENOL) 500 MG tablet Take 1,000 mg by mouth 2 (two) times daily as needed (pain).    Yes Historical Provider, MD  albuterol (PROVENTIL HFA;VENTOLIN HFA) 108 (90 BASE) MCG/ACT inhaler Inhale 2 puffs into the lungs every 4 (four) hours as needed for wheezing or shortness of breath.    Yes Historical Provider, MD  albuterol (PROVENTIL) (2.5 MG/3ML) 0.083% nebulizer solution Take 2.5 mg by nebulization every 2 (two) hours as needed for wheezing or shortness of breath.   Yes Historical Provider, MD  Alpha-D-Galactosidase (BEANO PO) Take 1 tablet by mouth daily.   Yes Historical Provider, MD  Apremilast (OTEZLA) 30 MG TABS Take 30 mg by mouth daily.    Yes Historical Provider, MD  atorvastatin (LIPITOR) 20 MG tablet Take 20 mg by mouth at bedtime.   Yes Historical Provider, MD  esomeprazole (NEXIUM) 40 MG capsule Take 1 capsule by mouth 30 min before breakfast. Patient taking differently: Take 40 mg by mouth daily.  03/03/15  Yes Amy S Esterwood, PA-C  famotidine (PEPCID) 20 MG tablet Take 1 tablet (20 mg total) by  mouth daily. 02/02/15  Yes Ripudeep Krystal Eaton, MD  feeding supplement (BOOST / RESOURCE BREEZE) LIQD Take 1 Container by mouth 3 (three) times daily between meals. 03/06/15  Yes Shanker Kristeen Mans, MD  ferrous sulfate 325 (65 FE) MG tablet Take 325 mg by mouth daily with breakfast.   Yes Historical Provider, MD  fluticasone (FLONASE) 50 MCG/ACT nasal spray Place 2 sprays into both nostrils daily as needed for allergies or rhinitis.    Yes Historical Provider, MD  Fluticasone-Salmeterol (ADVAIR) 250-50 MCG/DOSE AEPB Inhale 1 puff into the lungs 2 (two) times daily. 09/25/14  Yes Noralee Space, MD  furosemide (LASIX) 80 MG tablet Take 80 mg (2 tablets) in am and 40 mg (1 tablet) in pm 03/26/15  Yes Larey Dresser, MD  hydrocortisone (ANUSOL-HC) 2.5 % rectal cream  Place 1 application rectally 4 (four) times daily. 02/28/15  Yes Amy S Esterwood, PA-C  insulin lispro (HUMALOG KWIKPEN) 100 UNIT/ML KiwkPen Inject 0.01-0.1 mLs (1-10 Units total) into the skin 3 (three) times daily. Sliding scale CBG 70 - 120: 0 units CBG 121 - 150: 1 unit,  CBG 151 - 200: 2 units,  CBG 201 - 250: 3 units,  CBG 251 - 300: 5 units,  CBG 301 - 350: 7 units,  CBG 351 - 400: 9 units   CBG > 400: 10 units and notify your MD 02/02/15  Yes Ripudeep Krystal Eaton, MD  loperamide (IMODIUM) 2 MG capsule Take 2 capsules (4 mg total) by mouth daily. 03/16/15  Yes Oswald Hillock, MD  Multiple Vitamins-Minerals (MULTIVITAMIN WITH MINERALS) tablet Take 1 tablet by mouth daily.   Yes Historical Provider, MD  ondansetron (ZOFRAN) 4 MG tablet Take 1 tablet (4 mg total) by mouth every 8 (eight) hours as needed for nausea or vomiting. 02/28/15  Yes Amy S Esterwood, PA-C  promethazine (PHENERGAN) 25 MG tablet Take 1 tablet (25 mg total) by mouth every 4 (four) hours as needed for nausea or vomiting. 02/02/15  Yes Ripudeep Krystal Eaton, MD  saccharomyces boulardii (FLORASTOR) 250 MG capsule Take 1 capsule (250 mg total) by mouth 2 (two) times daily. 02/02/15  Yes Ripudeep Krystal Eaton, MD   sodium bicarbonate 650 MG tablet Take 1 tablet (650 mg total) by mouth 2 (two) times daily. 02/02/15  Yes Ripudeep Krystal Eaton, MD  TOUJEO SOLOSTAR 300 UNIT/ML SOPN Inject 40 Units into the skin every morning. 11/21/14  Yes Historical Provider, MD  triamcinolone cream (KENALOG) 0.1 % Apply 1 application topically 2 (two) times daily.   Yes Historical Provider, MD  warfarin (COUMADIN) 1 MG tablet Take 1.5 tablets (1.5 mg total) by mouth every evening. Take 3 mg today -03/06/15-and then resume 1.5 mg from 8/5 03/06/15  Yes Shanker Kristeen Mans, MD   BP 176/81 mmHg  Pulse 77  Temp(Src) 97.5 F (36.4 C) (Oral)  Ht 5\' 3"  (1.6 m)  Wt 153 lb (69.4 kg)  BMI 27.11 kg/m2  SpO2 100% Physical Exam  Constitutional: She is oriented to person, place, and time. She appears well-developed and well-nourished.  Non-toxic appearance. No distress.  HENT:  Head: Normocephalic and atraumatic.  Eyes: Conjunctivae, EOM and lids are normal. Pupils are equal, round, and reactive to light.  Neck: Normal range of motion. Neck supple. No tracheal deviation present. No thyroid mass present.  Cardiovascular: Normal rate, regular rhythm and normal heart sounds.  Exam reveals no gallop.   No murmur heard. Pulmonary/Chest: Effort normal and breath sounds normal. No stridor. No respiratory distress. She has no decreased breath sounds. She has no wheezes. She has no rhonchi. She has no rales.  Abdominal: Soft. Normal appearance and bowel sounds are normal. She exhibits no distension. There is tenderness in the epigastric area. There is no rigidity, no rebound, no guarding and no CVA tenderness.  Musculoskeletal: Normal range of motion. She exhibits no edema or tenderness.  Neurological: She is alert and oriented to person, place, and time. She has normal strength. No cranial nerve deficit or sensory deficit. GCS eye subscore is 4. GCS verbal subscore is 5. GCS motor subscore is 6.  Skin: Skin is warm and dry. No abrasion and no rash noted.   Psychiatric: She has a normal mood and affect. Her speech is normal and behavior is normal.  Nursing note and vitals reviewed.   ED  Course  Procedures (including critical care time) Labs Review Labs Reviewed  COMPREHENSIVE METABOLIC PANEL  CBC WITH DIFFERENTIAL/PLATELET  LIPASE, BLOOD  URINALYSIS, ROUTINE W REFLEX MICROSCOPIC (NOT AT [facility])    Imaging Review No results found. I have personally reviewed and evaluated these images and lab results as part of my medical decision-making.   EKG Interpretation None      MDM   Final diagnoses:  None    Patient with evidence of worsening dehydration. Given IV fluids here as well as pain meds. Will admit for further management    Lacretia Leigh, MD 04/02/15 1432

## 2015-04-02 NOTE — ED Notes (Signed)
Bed: VO53 Expected date:  Expected time:  Means of arrival:  Comments: EMS- 69yo F, abdominal pain, n/v/d

## 2015-04-02 NOTE — H&P (Signed)
History and Physical        Hospital Admission Note Date: 04/02/2015  Patient name: [name] Medical record number: [id number] Date of birth: [birth year] Age: 69 y.o. Gender: female  PCP: FULP, CAMMIE, MD  Referring physician: Dr Zenia Resides  Chief Complaint:  Abdominal cramping with nausea   HPI: Patient is a 69 year old female with COPD on home O2 2 L at home, chronic respiratory failure, chronic kidney disease, stage IV, chronic systolic and diastolic CHF, LV thrombus on warfarin, hypertension, diabetes mellitus type 2, sarcoidosis presented to ED with abdominal cramping pain, nausea and vomiting. Patient was recently discharged from the hospital on 8/14 with similar presentation. Patient had extensive GI workup since June 2016, essentially negative and per gastroenterology, may be due to low flow state and chronic mesenteric ischemia. History was obtained from the patient and the sister in the room, patient lives alone, poor historian, had received Dilaudid, Ativan prior to the encounter and was somewhat somnolent. Per sister, patient follows somewhat improved after her prior discharge on 8/14 for a few days. However she still continued to have chronic abdominal cramping usually after eating with nausea and had been using Zofran which was controlling her symptoms. Patient also followed with cardiology on 8/24. Patient was seen in the ER yesterday on 8/30 with similar symptoms, this time abdominal pain going on for a week, diffuse cramping associated with nausea and vomiting, started 2 days ago and worse since yesterday. No fevers, chills or diarrhea. Patient was symptomatically treated and discharged home. She returned back today to the ER, not eating, nausea and vomiting, abdominal cramping, BUN has worsened to 45 from 37, creatinine worsened to 4.88 from 3.67 yesterday, baseline  around 3.1-3.9.  [facility] was requested for admission. GI workup CT abdomen pelvis 8/7 were negative for acute findings. C. difficile PCR negative. Gastric emptying study on 6/26 which was unremarkable. MRA abd gram showed no evidence of mesenteric arterial occlusive disease. Proximal mesenteric arteries are normally patent. Patient underwent EGD and colonoscopy on 03/14/15. EGD showed no significant abnormality. Colonoscopy showed polyps and mild diverticulosis.   Review of Systems:  Difficult to obtain from the patient, somewhat somnolent due to IV pain medications, Ativan prior to my encounter  Past Medical History: Past Medical History  Diagnosis Date  . Essential hypertension, benign   . Coronary atherosclerosis of native coronary artery 01/04/2006    Tiny OM1 70-90% ostial stenosis, no other CAD  . COPD (chronic obstructive pulmonary disease)   . Esophageal reflux   . Dyslipidemia   . Gout   . Colon polyps 2011    Tubular adenomatous polyps  . Spinal stenosis of lumbar region     chronic low back pain.   . Bulging lumbar disc   . Chronic diastolic heart failure   . PNA (pneumonia) 2012    With pleural effusion, requiring thoracentesis  . Chronic bronchitis   . On home oxygen therapy   . OSA (obstructive sleep apnea)     Marginally compliant with CPAP  . Type II diabetes mellitus   . Arthritis   . Diverticulosis   . GERD (gastroesophageal reflux disease)   . Anxiety   .  Hyperlipidemia   . Sarcoidosis of lung   . CKD (chronic kidney disease) stage 4, GFR 15-29 ml/min   . IBS (irritable bowel syndrome)   . Cataracts, both eyes   . Headache(784.0)   . Lactic acidosis 01/21/2015  . Chronic CHF (congestive heart failure)     Past Surgical History  Procedure Laterality Date  . Vesicovaginal fistula closure w/  total abdominal hysterectomy  1992  . Bilateral total knee replacements Bilateral Rt=5/04 & Lft=1/09    by DrAlusio  . Open splenectomy  09/2010      by Dr. Zella Richer  . Appendectomy  1957  . Tonsillectomy  1968  . Cholecystectomy  1980's  . Abdominal hysterectomy  1992  . Reduction mammaplasty Bilateral 1980's  . Cardiac catheterization  1990's  . Thoracentesis  2012  . Joint replacement    . Av fistula placement Left 12/31/2013    Procedure: ARTERIOVENOUS (AV) FISTULA CREATION;  Surgeon: Elam Dutch, MD;  Location: [facility];  Service: Vascular;  Laterality: Left;  . Colonoscopy w/ biopsies and polypectomy    . Av fistula placement Left 03/18/2014    Procedure: ARTERIOVENOUS (AV) FISTULA CREATION- LEFT BRACHIOCEPHALIC ;  Surgeon: Elam Dutch, MD;  Location: [facility] OR;  Service: Vascular;  Laterality: Left;  . Ligation of arteriovenous  fistula Left 03/18/2014    Procedure: LIGATION OF ARTERIOVENOUS  FISTULA- LEFT RADIOCEPHALIC;  Surgeon: Elam Dutch, MD;  Location: [place];  Service: Vascular;  Laterality: Left;  . Cardiac catheterization N/A 01/27/2015    Procedure: Right Heart Cath;  Surgeon: Larey Dresser, MD;  Location: [facility] [facility];  Service: Cardiovascular;  Laterality: N/A;  . Esophagogastroduodenoscopy (egd) with propofol N/A 03/14/2015    Procedure: ESOPHAGOGASTRODUODENOSCOPY (EGD) WITH PROPOFOL;  Surgeon: Gatha Mayer, MD;  Location: [facility];  Service: Endoscopy;  Laterality: N/A;  . Colonoscopy with propofol N/A 03/14/2015    Procedure: COLONOSCOPY WITH PROPOFOL;  Surgeon: Gatha Mayer, MD;  Location: [place];  Service: Endoscopy;  Laterality: N/A;    Medications: Prior to Admission medications   Medication Sig Start Date End Date Taking? Authorizing Provider  acetaminophen (TYLENOL) 500 MG tablet Take 1,000 mg by mouth 2 (two) times daily as needed (pain).    Yes Historical Provider, MD  albuterol (PROVENTIL HFA;VENTOLIN HFA) 108 (90 BASE) MCG/ACT inhaler Inhale 2 puffs into the lungs every 4 (four) hours as needed for wheezing or shortness of breath.    Yes Historical Provider, MD  albuterol  (PROVENTIL) (2.5 MG/3ML) 0.083% nebulizer solution Take 2.5 mg by nebulization every 2 (two) hours as needed for wheezing or shortness of breath.   Yes Historical Provider, MD  Alpha-D-Galactosidase (BEANO PO) Take 1 tablet by mouth daily.   Yes Historical Provider, MD  Apremilast (OTEZLA) 30 MG TABS Take 30 mg by mouth daily.    Yes Historical Provider, MD  atorvastatin (LIPITOR) 20 MG tablet Take 20 mg by mouth at bedtime.   Yes Historical Provider, MD  esomeprazole (NEXIUM) 40 MG capsule Take 1 capsule by mouth 30 min before breakfast. Patient taking differently: Take 40 mg by mouth daily.  03/03/15  Yes Amy S Esterwood, PA-C  famotidine (PEPCID) 20 MG tablet Take 1 tablet (20 mg total) by mouth daily. 02/02/15  Yes Meryn Sarracino Krystal Eaton, MD  feeding supplement (BOOST / RESOURCE BREEZE) LIQD Take 1 Container by mouth 3 (three) times daily between meals. 03/06/15  Yes Jonetta Osgood, MD  ferrous sulfate 325 (65 FE) MG  tablet Take 325 mg by mouth daily with breakfast.   Yes Historical Provider, MD  fluticasone (FLONASE) 50 MCG/ACT nasal spray Place 2 sprays into both nostrils daily as needed for allergies or rhinitis.    Yes Historical Provider, MD  Fluticasone-Salmeterol (ADVAIR) 250-50 MCG/DOSE AEPB Inhale 1 puff into the lungs 2 (two) times daily. 09/25/14  Yes Noralee Space, MD  furosemide (LASIX) 80 MG tablet Take 80 mg (2 tablets) in am and 40 mg (1 tablet) in pm 03/26/15  Yes Larey Dresser, MD  hydrocortisone (ANUSOL-HC) 2.5 % rectal cream Place 1 application rectally 4 (four) times daily. 02/28/15  Yes Amy S Esterwood, PA-C  insulin lispro (HUMALOG KWIKPEN) 100 UNIT/ML KiwkPen Inject 0.01-0.1 mLs (1-10 Units total) into the skin 3 (three) times daily. Sliding scale CBG 70 - 120: 0 units CBG 121 - 150: 1 unit,  CBG 151 - 200: 2 units,  CBG 201 - 250: 3 units,  CBG 251 - 300: 5 units,  CBG 301 - 350: 7 units,  CBG 351 - 400: 9 units   CBG > 400: 10 units and notify your MD 02/02/15  Yes Latroya Ng Krystal Eaton, MD    loperamide (IMODIUM) 2 MG capsule Take 2 capsules (4 mg total) by mouth daily. 03/16/15  Yes Oswald Hillock, MD  Multiple Vitamins-Minerals (MULTIVITAMIN WITH MINERALS) tablet Take 1 tablet by mouth daily.   Yes Historical Provider, MD  ondansetron (ZOFRAN) 4 MG tablet Take 1 tablet (4 mg total) by mouth every 8 (eight) hours as needed for nausea or vomiting. 02/28/15  Yes Amy S Esterwood, PA-C  promethazine (PHENERGAN) 25 MG tablet Take 1 tablet (25 mg total) by mouth every 4 (four) hours as needed for nausea or vomiting. 02/02/15  Yes Lequan Dobratz Krystal Eaton, MD  saccharomyces boulardii (FLORASTOR) 250 MG capsule Take 1 capsule (250 mg total) by mouth 2 (two) times daily. 02/02/15  Yes Saddie Sandeen Krystal Eaton, MD  sodium bicarbonate 650 MG tablet Take 1 tablet (650 mg total) by mouth 2 (two) times daily. 02/02/15  Yes Christifer Chapdelaine Krystal Eaton, MD  TOUJEO SOLOSTAR 300 UNIT/ML SOPN Inject 40 Units into the skin every morning. 11/21/14  Yes Historical Provider, MD  triamcinolone cream (KENALOG) 0.1 % Apply 1 application topically 2 (two) times daily.   Yes Historical Provider, MD  warfarin (COUMADIN) 1 MG tablet Take 1.5 tablets (1.5 mg total) by mouth every evening. Take 3 mg today -03/06/15-and then resume 1.5 mg from 8/5 03/06/15  Yes Shanker Kristeen Mans, MD    Allergies:   Allergies  Allergen Reactions  . Adhesive [Tape] Other (See Comments)    Blisters   . Avelox [Moxifloxacin Hcl In Nacl] Other (See Comments)    GI upset  . Codeine Other (See Comments)    "crazy"   . Guaifenesin Nausea And Vomiting    Takes Mucinex at home without issue  . Latex Swelling  . Oxycodone Nausea And Vomiting    Takes Percocet at home without issue    Social History:  reports that she has never smoked. She has never used smokeless tobacco. She reports that she does not drink alcohol or use illicit drugs. patient is at home alone, has family checks up on her  Family History: Family History  Problem Relation Age of Onset  . Diabetes Mother    . Heart disease Mother   . Hyperlipidemia Mother   . Varicose Veins Mother   . Breast cancer Maternal Aunt   . Heart disease  Father   . Deep vein thrombosis Father   . Hyperlipidemia Father   . Heart disease Sister     before age 40  . Cancer Sister   . Diabetes Sister   . Hyperlipidemia Sister   . Heart attack Sister   . Heart disease Brother   . Diabetes Brother   . Hyperlipidemia Brother   . Heart attack Brother   . Hyperlipidemia Sister     Physical Exam: Blood pressure 176/81, pulse 77, temperature 97.5 F (36.4 C), temperature source Oral, height 5\' 3"  (1.6 m), weight 69.4 kg (153 lb), SpO2 100 %. General: Alert, awake, oriented x3, in no acute distress. HEENT: normocephalic, atraumatic, anicteric sclera, pink conjunctiva, pupils equal and reactive to light and accomodation, oropharynx clear Neck: supple, no masses or lymphadenopathy, no goiter, no bruits  Heart: Regular rate and rhythm, without murmurs, rubs or gallops. Lungs: Clear to auscultation bilaterally, no wheezing, rales or rhonchi. Abdomen: Soft, diffuse abdominal tenderness to deep palpation, nondistended, positive bowel sounds, no masses. Extremities: No clubbing, cyanosis or edema with positive pedal pulses. Neuro: Grossly intact, no focal neurological deficits, strength 5/5 upper and lower extremities bilaterally Psych: alert and oriented x 3, normal mood and affect Skin: no rashes or lesions, warm and dry   LABS on Admission:  Basic Metabolic Panel:  Recent Labs Lab 04/01/15 1203 04/02/15 1324  NA 135 131*  K 3.7 4.2  CL 101 97*  CO2 23 18*  GLUCOSE 280* 191*  BUN 37* 45*  CREATININE 3.61* 4.88*  CALCIUM 10.5* 9.8   Liver Function Tests:  Recent Labs Lab 04/01/15 1203 04/02/15 1324  AST 26 100*  ALT 15 41  ALKPHOS 159* 172*  BILITOT 0.6 1.0  PROT 7.1 7.1  ALBUMIN 2.8* 3.3*    Recent Labs Lab 04/01/15 1203 04/02/15 1324  LIPASE 21* 21*   No results for input(s): AMMONIA in  the last 168 hours. CBC:  Recent Labs Lab 04/01/15 1203 04/02/15 1324  WBC 11.3* 13.1*  NEUTROABS  --  7.8*  HGB 15.2* 15.4*  HCT 45.4 46.2*  MCV 87.8 88.3  PLT 671* 595*   Cardiac Enzymes: No results for input(s): CKTOTAL, CKMB, CKMBINDEX, TROPONINI in the last 168 hours. BNP: Invalid input(s): POCBNP CBG: No results for input(s): GLUCAP in the last 168 hours.  Radiological Exams on Admission:  Ct Abdomen Pelvis Wo Contrast  03/09/2015   CLINICAL DATA:  Patient recently admitted to the hospital for abdominal pain and nausea/vomiting. Returning abdominal pain and cramping.  EXAM: CT ABDOMEN AND PELVIS WITHOUT CONTRAST  TECHNIQUE: Multidetector CT imaging of the abdomen and pelvis was performed following the standard protocol without IV contrast.  COMPARISON:  CT abdomen pelvis 01/20/2015  FINDINGS: Lower chest: Coronary arterial vascular calcifications. Normal heart size. Scarring and or atelectasis within right greater than left lower lobes. No pleural effusion.  Hepatobiliary: Stable irregular area of low attenuation and calcification within the posterior right hepatic lobe, likely sequelae of remote trauma. No new hepatic lesions identified. Post cholecystectomy changes.  Pancreas: Unremarkable  Spleen: Splenosis.  Surgically absent spleen.  Adrenals/Urinary Tract: Normal adrenal glands. Kidneys are symmetric in size. No nephroureterolithiasis. No hydronephrosis. Urinary bladder is unremarkable.  Stomach/Bowel: Oral contrast material to the rectum. Few scattered colonic diverticuli. No evidence for acute diverticulitis. No evidence for bowel obstruction. No free fluid or free intraperitoneal air.  Vascular/Lymphatic: Normal caliber abdominal aorta. No retroperitoneal adenopathy.  Other: None  Musculoskeletal: No aggressive or acute appearing osseous lesions.  IMPRESSION: No acute process within the abdomen or pelvis.  Status post splenectomy with multiple splenules.  Post cholecystectomy.    Electronically Signed   By: Lovey Newcomer M.D.   On: 03/09/2015 20:21   Mr [name] Abdomen Wo Contrast  03/12/2015   CLINICAL DATA:  Frequent episodes of abdominal pain, nausea, vomiting loose stools. History of stage 4 chronic kidney disease prohibiting use of iodinated contrast and gadolinium with GFR of 12 mL/minute.  EXAM: MRA ABDOMEN WITHOUT CONTRAST  TECHNIQUE: Angiographic images of the chest were obtained using MRA technique without intravenous contrast.  CONTRAST:  None  COMPARISON:  Unenhanced CT of the abdomen and pelvis on 03/09/2015  FINDINGS: Good quality MRA imaging was able to be obtained utilizing flow sensitive sequences. The abdominal aorta is of normal caliber. There is no evidence of significant atherosclerosis or aneurysmal disease of the aorta. Proximal mesenteric arteries are well visualized. The celiac trunk, proximal SMA and proximal IMA show normal patency. Branch vessel evaluation is limited by unenhanced techniques. 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 The common hepatic artery is widely patent.  Proximal segments of the renal arteries are normally patent bilaterally. The preliminary unenhanced imaging shows no obvious abnormalities involving bowel loops. No abnormal fluid collections are seen.  IMPRESSION: Adequate unenhanced MRA of the abdomen demonstrates no evidence of mesenteric arterial occlusive disease. Proximal mesenteric arteries are normally patent. 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 Bilateral proximal renal artery segments are normally patent.   Electronically Signed   By: Aletta Edouard M.D.   On: 03/12/2015 09:03   Dg Chest Port 1 View  03/10/2015   CLINICAL DATA:  Central line placement today.  EXAM: PORTABLE CHEST - 1 VIEW  COMPARISON:  PA and lateral chest 02/24/2015.  FINDINGS: Right IJ central venous catheter is now in place with the tip projecting just above the superior cavoatrial junction. No pneumothorax  is identified. The lungs appear clear. Heart size is normal.  IMPRESSION: Right IJ catheter tip projects just above the superior cavoatrial junction. Negative for pneumothorax.   Electronically Signed   By: Inge Rise M.D.   On: 03/10/2015 13:53    *I have personally reviewed the images above*  EKG: No EKG available   Assessment/Plan Principal Problem:   ABDOMINAL PAIN -GENERALIZED: Unclear etiology however extensive GI workup in the last 2 months, thought to be from low flow state, chronic mesenteric ischemia. Prior GI work-up: CT abdomen pelvis 8/7 were negative for acute findings. C. difficile PCR negative. Gastric emptying study on 6/26 which was unremarkable. MRA abd gram showed no evidence of mesenteric arterial occlusive disease. Proximal mesenteric arteries are normally patent. Patient underwent EGD and colonoscopy on 03/14/15. EGD showed no significant abnormality. Colonoscopy showed polyps and mild diverticulosis. Patient also had right heart cath in June 2016 which had shown preserved cardiac output, moderate pulmonary arterial hypertension. - Admit to MedSurg, placed on gentle hydration, pain control, antiemetics, check celiac panel - Patient had extensive GI workup recently, including stool studies which all has been negative so far - will symptomatically treat, add Bentyl  Active Problems:   Acute renal failure superimposed on stage 4 chronic kidney disease - Currently appears to be hypokalemic and clinically dry, hold Lasix and placed on gentle hydration, continue bicarbonate    Essential hypertension - Currently stable, continue Coreg    HYPERCHOLESTEROLEMIA  - continue statin    Diabetes mellitus with renal manifestation Placed on sliding scale  insulin, decrease Lantus dose to half until patient is tolerating solid diet   Chronic systolic and diastolic CHF  - Currently clinically dry, hold Lasix  - 2-D echo in June 2016 showed EF of 30-35% no regional wall  motion abnormalities   pulmonary hypertension Stable, PA pressure 62 mmHg by echo on 01/21/15  LV thrombus Continue Coumadin per pharmacy  DVT prophylaxis:  on Coumadin   CODE STATUS:  full CODE STATUS   Family Communication: Admission, patients condition and plan of care including tests being ordered have been discussed with the patient and sister  who indicates understanding and agree with the plan and Code Status  Disposition plan: Further plan will depend as patient's clinical course evolves and further radiologic and laboratory data become available.   Time Spent on Admission: 73mins   Vianney Kopecky M.D. [facility] 04/02/2015, 2:50 PM Pager: [phone]  If 7PM-7AM, please contact night-coverage [email or web address] Password [email or web address]

## 2015-04-02 NOTE — ED Notes (Signed)
Nurse in the room doing a ultrasound IV 

## 2015-04-02 NOTE — Progress Notes (Signed)
ANTICOAGULATION CONSULT NOTE - Initial Consult  Pharmacy Consult for Coumadin Indication: LV thrombus  Allergies  Allergen Reactions  . Adhesive [Tape] Other (See Comments)    Blisters   . Avelox [Moxifloxacin Hcl In Nacl] Other (See Comments)    GI upset  . Codeine Other (See Comments)    "crazy"   . Guaifenesin Nausea And Vomiting    Takes Mucinex at home without issue  . Latex Swelling  . Oxycodone Nausea And Vomiting    Takes Percocet at home without issue    Patient Measurements: Height: 5\' 3"  (160 cm) Weight: 153 lb (69.4 kg) IBW/kg (Calculated) : 52.4  Vital Signs: Temp: 97.8 F (36.6 C) (08/31 1658) Temp Source: Oral (08/31 1658) BP: 158/97 mmHg (08/31 1834) Pulse Rate: 93 (08/31 1658)  Labs:  Recent Labs  04/01/15 04/01/15 1203 04/01/15 1431 04/02/15 1324 04/02/15 1800  HGB  --  15.2*  --  15.4*  --   HCT  --  45.4  --  46.2*  --   PLT  --  671*  --  595*  --   LABPROT  --   --  30.0*  --  37.1*  INR 3.1  --  2.92*  --  3.87*  CREATININE  --  3.61*  --  4.88*  --     Estimated Creatinine Clearance: 10.5 mL/min (by C-G formula based on Cr of 4.88).   Medical History: Past Medical History  Diagnosis Date  . Essential hypertension, benign   . Coronary atherosclerosis of native coronary artery 01/04/2006    Tiny OM1 70-90% ostial stenosis, no other CAD  . COPD (chronic obstructive pulmonary disease)   . Esophageal reflux   . Dyslipidemia   . Gout   . Colon polyps 2011    Tubular adenomatous polyps  . Spinal stenosis of lumbar region     chronic low back pain.   . Bulging lumbar disc   . Chronic diastolic heart failure   . PNA (pneumonia) 2012    With pleural effusion, requiring thoracentesis  . Chronic bronchitis   . On home oxygen therapy   . OSA (obstructive sleep apnea)     Marginally compliant with CPAP  . Type II diabetes mellitus   . Arthritis   . Diverticulosis   . GERD (gastroesophageal reflux disease)   . Anxiety   .  Hyperlipidemia   . Sarcoidosis of lung   . CKD (chronic kidney disease) stage 4, GFR 15-29 ml/min   . IBS (irritable bowel syndrome)   . Cataracts, both eyes   . Headache(784.0)   . Lactic acidosis 01/21/2015  . Chronic CHF (congestive heart failure)     Medications:  Prescriptions prior to admission  Medication Sig Dispense Refill Last Dose  . acetaminophen (TYLENOL) 500 MG tablet Take 1,000 mg by mouth 2 (two) times daily as needed (pain).    Past Month at Unknown time  . albuterol (PROVENTIL HFA;VENTOLIN HFA) 108 (90 BASE) MCG/ACT inhaler Inhale 2 puffs into the lungs every 4 (four) hours as needed for wheezing or shortness of breath.    PRN  . albuterol (PROVENTIL) (2.5 MG/3ML) 0.083% nebulizer solution Take 2.5 mg by nebulization every 2 (two) hours as needed for wheezing or shortness of breath.   PRN  . Alpha-D-Galactosidase (BEANO PO) Take 1 tablet by mouth daily.   04/02/2015 at Unknown time  . Apremilast (OTEZLA) 30 MG TABS Take 30 mg by mouth daily.    04/02/2015 at Unknown time  .  atorvastatin (LIPITOR) 20 MG tablet Take 20 mg by mouth at bedtime.   04/01/2015 at Unknown time  . esomeprazole (NEXIUM) 40 MG capsule Take 1 capsule by mouth 30 min before breakfast. (Patient taking differently: Take 40 mg by mouth daily. ) 30 capsule 6 04/02/2015 at Unknown time  . famotidine (PEPCID) 20 MG tablet Take 1 tablet (20 mg total) by mouth daily. 30 tablet 3 04/02/2015 at Unknown time  . feeding supplement (BOOST / RESOURCE BREEZE) LIQD Take 1 Container by mouth 3 (three) times daily between meals. 90 Container 0 04/02/2015 at Unknown time  . ferrous sulfate 325 (65 FE) MG tablet Take 325 mg by mouth daily with breakfast.   04/02/2015 at Unknown time  . fluticasone (FLONASE) 50 MCG/ACT nasal spray Place 2 sprays into both nostrils daily as needed for allergies or rhinitis.    PRN  . Fluticasone-Salmeterol (ADVAIR) 250-50 MCG/DOSE AEPB Inhale 1 puff into the lungs 2 (two) times daily. 180 each 3  04/02/2015 at Unknown time  . furosemide (LASIX) 80 MG tablet Take 80 mg (2 tablets) in am and 40 mg (1 tablet) in pm 90 tablet 6 04/02/2015 at Unknown time  . hydrocortisone (ANUSOL-HC) 2.5 % rectal cream Place 1 application rectally 4 (four) times daily. 30 g 0 Past Week at Unknown time  . insulin lispro (HUMALOG KWIKPEN) 100 UNIT/ML KiwkPen Inject 0.01-0.1 mLs (1-10 Units total) into the skin 3 (three) times daily. Sliding scale CBG 70 - 120: 0 units CBG 121 - 150: 1 unit,  CBG 151 - 200: 2 units,  CBG 201 - 250: 3 units,  CBG 251 - 300: 5 units,  CBG 301 - 350: 7 units,  CBG 351 - 400: 9 units   CBG > 400: 10 units and notify your MD 15 mL 3 04/01/2015 at Unknown time  . loperamide (IMODIUM) 2 MG capsule Take 2 capsules (4 mg total) by mouth daily. 30 capsule 0 04/01/2015 at Unknown time  . Multiple Vitamins-Minerals (MULTIVITAMIN WITH MINERALS) tablet Take 1 tablet by mouth daily.   04/02/2015 at Unknown time  . ondansetron (ZOFRAN) 4 MG tablet Take 1 tablet (4 mg total) by mouth every 8 (eight) hours as needed for nausea or vomiting. 90 tablet 3 04/02/2015 at Unknown time  . promethazine (PHENERGAN) 25 MG tablet Take 1 tablet (25 mg total) by mouth every 4 (four) hours as needed for nausea or vomiting. 30 tablet 0 04/02/2015 at Unknown time  . saccharomyces boulardii (FLORASTOR) 250 MG capsule Take 1 capsule (250 mg total) by mouth 2 (two) times daily. 60 capsule 3 04/02/2015 at Unknown time  . sodium bicarbonate 650 MG tablet Take 1 tablet (650 mg total) by mouth 2 (two) times daily. 60 tablet 3 04/02/2015 at Unknown time  . TOUJEO SOLOSTAR 300 UNIT/ML SOPN Inject 40 Units into the skin every morning.  3 04/02/2015 at Unknown time  . triamcinolone cream (KENALOG) 0.1 % Apply 1 application topically 2 (two) times daily.   04/01/2015 at Unknown time  . warfarin (COUMADIN) 1 MG tablet Take 1.5 tablets (1.5 mg total) by mouth every evening. Take 3 mg today -03/06/15-and then resume 1.5 mg from 8/5 45 tablet 1  04/01/2015 at Unknown time    Assessment: 69 yo F on chronic Coumadin 1.5mg  daily for hx apical thrombus.  INR is supra-therapeutic on admission.  She was seen in ED yesterday and INR was therapeutic at upper end of goal range, however per outpatient flowsheet INR was 3.1 on  8/30 and she was instructed to take only 0.5mg  yesterday. INR 2.92-->3.87.  LD 8/30. No bleeding noted. She is admitted with abdominal pain and acute renal failure.    Goal of Therapy:  INR 2-3   Plan:  Hold Coumadin today Daily INR  Biagio Borg 04/02/2015,7:01 PM

## 2015-04-02 NOTE — ED Notes (Signed)
Pt placed on bedpan but unable to provide urine specimen at this time 

## 2015-04-03 DIAGNOSIS — E1129 Type 2 diabetes mellitus with other diabetic kidney complication: Secondary | ICD-10-CM

## 2015-04-03 DIAGNOSIS — R1084 Generalized abdominal pain: Secondary | ICD-10-CM

## 2015-04-03 DIAGNOSIS — N184 Chronic kidney disease, stage 4 (severe): Secondary | ICD-10-CM

## 2015-04-03 DIAGNOSIS — E785 Hyperlipidemia, unspecified: Secondary | ICD-10-CM

## 2015-04-03 DIAGNOSIS — N179 Acute kidney failure, unspecified: Secondary | ICD-10-CM

## 2015-04-03 LAB — CBC
HEMATOCRIT: 42.4 % (ref 36.0–46.0)
HEMOGLOBIN: 13.8 g/dL (ref 12.0–15.0)
MCH: 29.2 pg (ref 26.0–34.0)
MCHC: 32.5 g/dL (ref 30.0–36.0)
MCV: 89.6 fL (ref 78.0–100.0)
Platelets: 587 10*3/uL — ABNORMAL HIGH (ref 150–400)
RBC: 4.73 MIL/uL (ref 3.87–5.11)
RDW: 16.9 % — AB (ref 11.5–15.5)
WBC: 11.8 10*3/uL — ABNORMAL HIGH (ref 4.0–10.5)

## 2015-04-03 LAB — BASIC METABOLIC PANEL
ANION GAP: 10 (ref 5–15)
BUN: 38 mg/dL — ABNORMAL HIGH (ref 6–20)
CHLORIDE: 105 mmol/L (ref 101–111)
CO2: 21 mmol/L — AB (ref 22–32)
Calcium: 9.2 mg/dL (ref 8.9–10.3)
Creatinine, Ser: 3.79 mg/dL — ABNORMAL HIGH (ref 0.44–1.00)
GFR calc Af Amer: 13 mL/min — ABNORMAL LOW (ref >60)
GFR, EST NON AFRICAN AMERICAN: 11 mL/min — AB (ref >60)
GLUCOSE: 137 mg/dL — AB (ref 65–99)
POTASSIUM: 3.1 mmol/L — AB (ref 3.5–5.1)
Sodium: 136 mmol/L (ref 135–145)

## 2015-04-03 LAB — GLUCOSE, CAPILLARY
GLUCOSE-CAPILLARY: 58 mg/dL — AB (ref 65–99)
GLUCOSE-CAPILLARY: 88 mg/dL (ref 65–99)
Glucose-Capillary: 104 mg/dL — ABNORMAL HIGH (ref 65–99)
Glucose-Capillary: 103 mg/dL — ABNORMAL HIGH (ref 65–99)

## 2015-04-03 LAB — PROTIME-INR
INR: 3.95 — AB (ref 0.00–1.49)
Prothrombin Time: 37.6 seconds — ABNORMAL HIGH (ref 11.6–15.2)

## 2015-04-03 MED ORDER — PRO-STAT SUGAR FREE PO LIQD
30.0000 mL | Freq: Three times a day (TID) | ORAL | Status: DC
  Administered 2015-04-03 – 2015-04-05 (×6): 30 mL via ORAL
  Filled 2015-04-03 (×11): qty 30

## 2015-04-03 MED ORDER — POTASSIUM CHLORIDE CRYS ER 20 MEQ PO TBCR
40.0000 meq | EXTENDED_RELEASE_TABLET | Freq: Once | ORAL | Status: AC
  Administered 2015-04-03: 40 meq via ORAL
  Filled 2015-04-03: qty 2

## 2015-04-03 NOTE — Progress Notes (Addendum)
Patient ID: [name], female   DOB: 09/01/1945, 69 y.o.   MRN: [id number] [facility] PROGRESS NOTE  [name] WCB:[id number] DOB: 1945-12-02 DOA: 04/02/2015 PCP: [name] Blackbird, MD  Brief narrative:    44 -year-old female with past medical history of COPD on home oxygen, 2 L at baseline, chronic kidney disease stage IV, chronic systolic and diastolic CHF, left ventricular thrombus (on anticoagulation with Coumadin), diabetes type 2, sarcoidosis who presented to [facility] ED with reports of abdominal cramping, nausea and vomiting. She was recently hospitalized on 03/16/2015 with similar symptoms. She has had extensive GI workup since June 2016 which was essentially negative per gastroenterology but thought that possibility includes chronic mesenteric ischemia. GI workup included CT abdomen on 03/09/2015 which was negative for acute findings. C. difficile at that time was negative. Gastric emptying study in June 2016 was unremarkable. MRA of the abdomen did not show evidence of mesenteric arterial occlusive disease. EGD and colonoscopy on 03/14/2015 showed no significant abnormalities other than polyps and mild diverticulosis.  On the admission, patient was hemodynamically stable. Blood work was notable for leukocytosis of 11.3, creatinine 3.61, calcium 10.5 and INR of 2.92.  Anticipated discharge: Continue clear liquids today. Advance diet tomorrow if patient tolerates it. Home once GI symptoms improved.  Assessment/Plan:    Principal problem: Generalized abdominal pain / nausea and vomiting - As mentioned above patient has had extensive GI workup on recent hospitalization and it was thought that most likely etiology is mesenteric ischemia - Patient was started on IV fluids for supportive care. Her symptoms are little bit better this morning. - She is currently on clear liquid diet. Continue clears today and advance to solid foods tomorrow if she is able to tolerate  it   Active Problems: Acute renal failure superimposed on stage 4 chronic kidney disease / metabolic acidosis - Baseline creatinine about one month ago is around 4.04. - Creatinine on this admission as high as 4.88, likely reflective of GI losses, nausea and vomiting - Creatinine improving with IV fluids - Continue sodium bicarbonate supplementation  Hypokalemia - Secondary to GI losses - Supplemented - Follow-up BMP tomorrow morning  Essential hypertension - Continue carvedilol 3.125 mg twice daily  Anemia of chronic disease - Secondary to anemia of chronic kidney disease - Hemoglobin stable - No reports of bleeding - Follow-up CBC tomorrow morning  Leukocytosis - Likely reactive. No evidence of acute infection.  Dyslipidemia - Continue statin therapy  Diabetes mellitus 2 with renal manifestation - Continue current insulin regimen which includes Lantus 20 units daily along with sliding scale insulin  Chronic systolic and diastolic CHF  - Will hold Lasix until renal function improves - 2-D echo in June 2016 showed EF of 30-35% no regional wall motion abnormalities   LV thrombus - Continue Coumadin per pharmacy   DVT Prophylaxis  - On anticoagulation with Coumadin   Code Status: Full.  Family Communication:  plan of care discussed with the patient Disposition Plan: Home when GI issues improve   IV access:  Peripheral IV  Procedures and diagnostic studies:    No results found.  Medical Consultants:  None  Other Consultants:  None  IAnti-Infectives:   None   Leisa Lenz, MD  [facility] Pager [phone]  Time spent in minutes: 25 minutes  If 7PM-7AM, please contact night-coverage [email or web address] Password [email or web address] 04/03/2015, 12:13 PM   LOS: 1 day    HPI/Subjective: No acute overnight events. Patient reports mild nausea but no vomiting.  Objective: Filed Vitals:   04/02/15 1834 04/02/15 2120 04/03/15 0525 04/03/15 0800  BP: 158/97 155/84  157/78 160/56  Pulse:  107 101 64  Temp:  97.8 F (36.6 C) 98.6 F (37 C) 98 F (36.7 C)  TempSrc:  Oral Oral Oral  Resp:  18 18 18   Height:      Weight:      SpO2:  100% 98% 100%    Intake/Output Summary (Last 24 hours) at 04/03/15 1213 Last data filed at 04/03/15 1000  Gross per 24 hour  Intake  897.5 ml  Output   1700 ml  Net -802.5 ml    Exam:   General:  Pt is alert, follows commands appropriately, not in acute distress  Cardiovascular: Regular rate and rhythm, S1/S2, no murmurs  Respiratory: Clear to auscultation bilaterally, no wheezing, no crackles, no rhonchi  Abdomen: Soft, non tender, non distended, bowel sounds present  Extremities: No edema, pulses DP and PT palpable bilaterally  Neuro: Grossly nonfocal  Data Reviewed: Basic Metabolic Panel:  Recent Labs Lab 04/01/15 1203 04/02/15 1324 04/03/15 0516  NA 135 131* 136  K 3.7 4.2 3.1*  CL 101 97* 105  CO2 23 18* 21*  GLUCOSE 280* 191* 137*  BUN 37* 45* 38*  CREATININE 3.61* 4.88* 3.79*  CALCIUM 10.5* 9.8 9.2   Liver Function Tests:  Recent Labs Lab 04/01/15 1203 04/02/15 1324  AST 26 100*  ALT 15 41  ALKPHOS 159* 172*  BILITOT 0.6 1.0  PROT 7.1 7.1  ALBUMIN 2.8* 3.3*    Recent Labs Lab 04/01/15 1203 04/02/15 1324  LIPASE 21* 21*   No results for input(s): AMMONIA in the last 168 hours. CBC:  Recent Labs Lab 04/01/15 1203 04/02/15 1324 04/03/15 0516  WBC 11.3* 13.1* 11.8*  NEUTROABS  --  7.8*  --   HGB 15.2* 15.4* 13.8  HCT 45.4 46.2* 42.4  MCV 87.8 88.3 89.6  PLT 671* 595* 587*   Cardiac Enzymes: No results for input(s): CKTOTAL, CKMB, CKMBINDEX, TROPONINI in the last 168 hours. BNP: Invalid input(s): POCBNP CBG:  Recent Labs Lab 04/02/15 1712 04/02/15 2117 04/03/15 0819 04/03/15 1136  GLUCAP 162* 118* 104* 103*    Recent Results (from the past 240 hour(s))  Urine culture     Status: None (Preliminary result)   Collection Time: 04/02/15  3:30 PM   Result Value Ref Range Status   Specimen Description URINE, CLEAN CATCH  Final   Special Requests NONE  Final   Culture   Final    TOO YOUNG TO READ Performed at [facility]    Report Status PENDING  Incomplete     Scheduled Meds: . Apremilast  30 mg Oral Daily  . atorvastatin  20 mg Oral QHS  . carvedilol  3.125 mg Oral BID WC  . dicyclomine  10 mg Oral TID AC & HS  . hydrocortisone  1 application Rectal QID  . insulin aspart  0-5 Units Subcutaneous QHS  . insulin aspart  0-9 Units Subcutaneous TID WC  . insulin glargine  20 Units Subcutaneous Daily  . potassium chloride  40 mEq Oral Once  . saccharomyces boulardii  250 mg Oral BID  . sodium bicarbonate  650 mg Oral BID  . Warfarin - Pharmacist Dosing Inpatient   Does not apply q1800   Continuous Infusions: . sodium chloride 75 mL/hr at 04/03/15 0830

## 2015-04-03 NOTE — Progress Notes (Signed)
ANTICOAGULATION CONSULT NOTE - Initial Consult  Pharmacy Consult for Coumadin Indication: LV thrombus  Allergies  Allergen Reactions  . Adhesive [Tape] Other (See Comments)    Blisters   . Avelox [Moxifloxacin Hcl In Nacl] Other (See Comments)    GI upset  . Codeine Other (See Comments)    "crazy"   . Guaifenesin Nausea And Vomiting    Takes Mucinex at home without issue  . Latex Swelling  . Oxycodone Nausea And Vomiting    Takes Percocet at home without issue    Patient Measurements: Height: 5\' 3"  (160 cm) Weight: 153 lb (69.4 kg) IBW/kg (Calculated) : 52.4  Vital Signs: Temp: 98 F (36.7 C) (09/01 0800) Temp Source: Oral (09/01 0800) BP: 160/56 mmHg (09/01 0800) Pulse Rate: 64 (09/01 0800)  Labs:  Recent Labs  04/01/15 1203 04/01/15 1431 04/02/15 1324 04/02/15 1800 04/03/15 0516  HGB 15.2*  --  15.4*  --  13.8  HCT 45.4  --  46.2*  --  42.4  PLT 671*  --  595*  --  587*  LABPROT  --  30.0*  --  37.1* 37.6*  INR  --  2.92*  --  3.87* 3.95*  CREATININE 3.61*  --  4.88*  --  3.79*    Estimated Creatinine Clearance: 13.5 mL/min (by C-G formula based on Cr of 3.79).   Medical History: Past Medical History  Diagnosis Date  . Essential hypertension, benign   . Coronary atherosclerosis of native coronary artery 01/04/2006    Tiny OM1 70-90% ostial stenosis, no other CAD  . COPD (chronic obstructive pulmonary disease)   . Esophageal reflux   . Dyslipidemia   . Gout   . Colon polyps 2011    Tubular adenomatous polyps  . Spinal stenosis of lumbar region     chronic low back pain.   . Bulging lumbar disc   . Chronic diastolic heart failure   . PNA (pneumonia) 2012    With pleural effusion, requiring thoracentesis  . Chronic bronchitis   . On home oxygen therapy   . OSA (obstructive sleep apnea)     Marginally compliant with CPAP  . Type II diabetes mellitus   . Arthritis   . Diverticulosis   . GERD (gastroesophageal reflux disease)   . Anxiety   .  Hyperlipidemia   . Sarcoidosis of lung   . CKD (chronic kidney disease) stage 4, GFR 15-29 ml/min   . IBS (irritable bowel syndrome)   . Cataracts, both eyes   . Headache(784.0)   . Lactic acidosis 01/21/2015  . Chronic CHF (congestive heart failure)     Medications:  Prescriptions prior to admission  Medication Sig Dispense Refill Last Dose  . acetaminophen (TYLENOL) 500 MG tablet Take 1,000 mg by mouth 2 (two) times daily as needed (pain).    Past Month at Unknown time  . albuterol (PROVENTIL HFA;VENTOLIN HFA) 108 (90 BASE) MCG/ACT inhaler Inhale 2 puffs into the lungs every 4 (four) hours as needed for wheezing or shortness of breath.    PRN  . albuterol (PROVENTIL) (2.5 MG/3ML) 0.083% nebulizer solution Take 2.5 mg by nebulization every 2 (two) hours as needed for wheezing or shortness of breath.   PRN  . Alpha-D-Galactosidase (BEANO PO) Take 1 tablet by mouth daily.   04/02/2015 at Unknown time  . Apremilast (OTEZLA) 30 MG TABS Take 30 mg by mouth daily.    04/02/2015 at Unknown time  . atorvastatin (LIPITOR) 20 MG tablet Take 20 mg  by mouth at bedtime.   04/01/2015 at Unknown time  . esomeprazole (NEXIUM) 40 MG capsule Take 1 capsule by mouth 30 min before breakfast. (Patient taking differently: Take 40 mg by mouth daily. ) 30 capsule 6 04/02/2015 at Unknown time  . famotidine (PEPCID) 20 MG tablet Take 1 tablet (20 mg total) by mouth daily. 30 tablet 3 04/02/2015 at Unknown time  . feeding supplement (BOOST / RESOURCE BREEZE) LIQD Take 1 Container by mouth 3 (three) times daily between meals. 90 Container 0 04/02/2015 at Unknown time  . ferrous sulfate 325 (65 FE) MG tablet Take 325 mg by mouth daily with breakfast.   04/02/2015 at Unknown time  . fluticasone (FLONASE) 50 MCG/ACT nasal spray Place 2 sprays into both nostrils daily as needed for allergies or rhinitis.    PRN  . Fluticasone-Salmeterol (ADVAIR) 250-50 MCG/DOSE AEPB Inhale 1 puff into the lungs 2 (two) times daily. 180 each 3  04/02/2015 at Unknown time  . furosemide (LASIX) 80 MG tablet Take 80 mg (2 tablets) in am and 40 mg (1 tablet) in pm 90 tablet 6 04/02/2015 at Unknown time  . hydrocortisone (ANUSOL-HC) 2.5 % rectal cream Place 1 application rectally 4 (four) times daily. 30 g 0 Past Week at Unknown time  . insulin lispro (HUMALOG KWIKPEN) 100 UNIT/ML KiwkPen Inject 0.01-0.1 mLs (1-10 Units total) into the skin 3 (three) times daily. Sliding scale CBG 70 - 120: 0 units CBG 121 - 150: 1 unit,  CBG 151 - 200: 2 units,  CBG 201 - 250: 3 units,  CBG 251 - 300: 5 units,  CBG 301 - 350: 7 units,  CBG 351 - 400: 9 units   CBG > 400: 10 units and notify your MD 15 mL 3 04/01/2015 at Unknown time  . loperamide (IMODIUM) 2 MG capsule Take 2 capsules (4 mg total) by mouth daily. 30 capsule 0 04/01/2015 at Unknown time  . Multiple Vitamins-Minerals (MULTIVITAMIN WITH MINERALS) tablet Take 1 tablet by mouth daily.   04/02/2015 at Unknown time  . ondansetron (ZOFRAN) 4 MG tablet Take 1 tablet (4 mg total) by mouth every 8 (eight) hours as needed for nausea or vomiting. 90 tablet 3 04/02/2015 at Unknown time  . promethazine (PHENERGAN) 25 MG tablet Take 1 tablet (25 mg total) by mouth every 4 (four) hours as needed for nausea or vomiting. 30 tablet 0 04/02/2015 at Unknown time  . saccharomyces boulardii (FLORASTOR) 250 MG capsule Take 1 capsule (250 mg total) by mouth 2 (two) times daily. 60 capsule 3 04/02/2015 at Unknown time  . sodium bicarbonate 650 MG tablet Take 1 tablet (650 mg total) by mouth 2 (two) times daily. 60 tablet 3 04/02/2015 at Unknown time  . TOUJEO SOLOSTAR 300 UNIT/ML SOPN Inject 40 Units into the skin every morning.  3 04/02/2015 at Unknown time  . triamcinolone cream (KENALOG) 0.1 % Apply 1 application topically 2 (two) times daily.   04/01/2015 at Unknown time  . warfarin (COUMADIN) 1 MG tablet Take 1.5 tablets (1.5 mg total) by mouth every evening. Take 3 mg today -03/06/15-and then resume 1.5 mg from 8/5 45 tablet 1  04/01/2015 at Unknown time    Assessment: 69 yo F on chronic Coumadin 1.5mg  daily for hx apical thrombus.  INR is supra-therapeutic on admission.  She was seen in ED yesterday and INR was therapeutic at upper end of goal range, however per outpatient flowsheet INR was 3.1 on 8/30 and she was instructed to take only  0.5mg  yesterday. INR 2.92-->3.87.  Last dose taken on 8/30. No bleeding noted.  She is admitted with abdominal pain and acute renal failure.    Today, 04/03/2015:  INR remains elevated at 3.95  hgb down 13.8, plt high  No bleeding documented  Drug-drug intxn: none  Diet: clear liquid  Scr elevated at 3.79  Goal of Therapy:  INR 2-3   Plan:  - continue to hold Coumadin today - Daily INR  Clemente Dewey P 04/03/2015,9:46 AM

## 2015-04-03 NOTE — Progress Notes (Signed)
Initial Nutrition Assessment  DOCUMENTATION CODES:   Non-severe (moderate) malnutrition in context of chronic illness  INTERVENTION:   Provide Prostat liquid protein PO 30 ml BID with meals, each supplement provides 100 kcal, 15 grams protein. Encourage PO intake RD to continue to monitor  NUTRITION DIAGNOSIS:   Inadequate oral intake related to nausea, vomiting as evidenced by meal completion < 50%.  GOAL:   Patient will meet greater than or equal to 90% of their needs  MONITOR:   PO intake, Supplement acceptance, Diet advancement, Labs, Weight trends, Skin, I & O's  REASON FOR ASSESSMENT:   Malnutrition Screening Tool    ASSESSMENT:   69 -year-old female with past medical history of COPD on home oxygen, 2 L at baseline, chronic kidney disease stage IV, chronic systolic and diastolic CHF, left ventricular thrombus (on anticoagulation with Coumadin), diabetes type 2, sarcoidosis who presented to [facility] ED with reports of abdominal cramping, nausea and vomiting. She was recently hospitalized on 03/16/2015 with similar symptoms.   Pt in room with clear liquid tray beside her. Pt ate ~50% of tray. Pt states that she has been battling N/V since May. During her previous admission at [facility], she was ordered [facility] but did not like it d/t being too sweet. She was then ordered Prostat but did not remember receiving this. RD to order and encouraged pt to mix it with juice or clear soda to mask taste.  Pt has lost 16 lb since 8/08 (9% weight loss x 1 month, significant for time frame).  Nutrition-Focused physical exam completed. Findings are no fat depletion, mild muscle depletion, and no edema.   Labs reviewed: Low K Elevated BUN & Creatinine  Diet Order:  Diet clear liquid Room service appropriate?: Yes; Fluid consistency:: Thin  Skin:  Reviewed, no issues  Last BM:  8/31  Height:   Ht Readings from Last 1 Encounters:  04/02/15 5\' 3"  (1.6 m)    Weight:   Wt  Readings from Last 1 Encounters:  04/02/15 153 lb (69.4 kg)    Ideal Body Weight:  52.3 kg  BMI:  Body mass index is 27.11 kg/(m^2).  Estimated Nutritional Needs:   Kcal:  1800-2000  Protein:  85-95g  Fluid:  1.9L/day  EDUCATION NEEDS:   No education needs identified at this time  Clayton Bibles, MS, RD, LDN Pager: [phone] After Hours Pager: [phone]

## 2015-04-04 DIAGNOSIS — E440 Moderate protein-calorie malnutrition: Secondary | ICD-10-CM

## 2015-04-04 LAB — GLUCOSE, CAPILLARY
GLUCOSE-CAPILLARY: 50 mg/dL — AB (ref 65–99)
GLUCOSE-CAPILLARY: 116 mg/dL — AB (ref 65–99)
GLUCOSE-CAPILLARY: 95 mg/dL (ref 65–99)
GLUCOSE-CAPILLARY: 102 mg/dL — AB (ref 65–99)
GLUCOSE-CAPILLARY: 86 mg/dL (ref 65–99)
Glucose-Capillary: 79 mg/dL (ref 65–99)
Glucose-Capillary: 100 mg/dL — ABNORMAL HIGH (ref 65–99)

## 2015-04-04 LAB — RETICULIN ANTIBODIES, IGA W TITER: Reticulin Ab, IgA: NEGATIVE titer (ref <2.5)

## 2015-04-04 LAB — URINE CULTURE

## 2015-04-04 LAB — GLIADIN ANTIBODIES, SERUM
GLIADIN IGA: 2 U (ref 0–19)
Gliadin IgG: 1 units (ref 0–19)

## 2015-04-04 LAB — PROTIME-INR
INR: 4.7 — AB (ref 0.00–1.49)
PROTHROMBIN TIME: 42.9 s — AB (ref 11.6–15.2)

## 2015-04-04 MED ORDER — DIPHENHYDRAMINE HCL 25 MG PO CAPS
25.0000 mg | ORAL_CAPSULE | Freq: Three times a day (TID) | ORAL | Status: DC | PRN
  Administered 2015-04-04 – 2015-04-05 (×2): 25 mg via ORAL
  Filled 2015-04-04 (×2): qty 1

## 2015-04-04 NOTE — Progress Notes (Signed)
Inpatient Diabetes Program Recommendations  AACE/ADA: New Consensus Statement on Inpatient Glycemic Control (2013)  Target Ranges:  Prepandial:   less than 140 mg/dL      Peak postprandial:   less than 180 mg/dL (1-2 hours)      Critically ill patients:  140 - 180 mg/dL   Reason for Visit: Hypoglycemia  Diabetes history: DM2 Outpatient Diabetes medications: Toujeo 40 QAM. Humalog 0-10 tidwc Current orders for Inpatient glycemic control: Lantus 20 units, Novolog sensitive tidwc and hs  Results for [name], [name] (MRN [id number]) as of 04/04/2015 18:06  Ref. Range 04/04/2015 07:38 04/04/2015 08:45 04/04/2015 11:51 04/04/2015 16:49 04/04/2015 17:48  Glucose-Capillary Latest Ref Range: 65-99 mg/dL 50 (L) 116 (H) 95 102 (H) 86  Results for [name], [name] (MRN [id number]) as of 04/04/2015 18:06  Ref. Range 04/03/2015 05:16  Sodium Latest Ref Range: 135-145 mmol/L 136  Potassium Latest Ref Range: 3.5-5.1 mmol/L 3.1 (L)  Chloride Latest Ref Range: 101-111 mmol/L 105  CO2 Latest Ref Range: 22-32 mmol/L 21 (L)  BUN Latest Ref Range: 6-20 mg/dL 38 (H)  Creatinine Latest Ref Range: 0.44-1.00 mg/dL 3.79 (H)  Calcium Latest Ref Range: 8.9-10.3 mg/dL 9.2  EGFR (Non-African Amer.) Latest Ref Range: >60 mL/min 11 (L)  EGFR (African American) Latest Ref Range: >60 mL/min 13 (L)  Glucose Latest Ref Range: 65-99 mg/dL 137 (H)  Anion gap Latest Ref Range: 5-15  10   Poor po intake. Has lost 16 pounds since 08/08. CBGs trending well.  Will likely need insulin titration when po intake improves. Thank you. Lorenda Peck, RD, LDN, CDE Inpatient Diabetes Coordinator [phone]

## 2015-04-04 NOTE — Progress Notes (Addendum)
Patient ID: [name], female   DOB: 1945/10/01, 69 y.o.   MRN: [id number] [facility] PROGRESS NOTE  Aime A Upham JSH:[id number] DOB: Aug 15, 1945 DOA: 04/02/2015 PCP: Antony Blackbird, MD  Brief narrative:    20 -year-old female with past medical history of COPD on home oxygen, 2 L at baseline, chronic kidney disease stage IV, chronic systolic and diastolic CHF, left ventricular thrombus (on anticoagulation with Coumadin), diabetes type 2, sarcoidosis who presented to [facility] ED with reports of abdominal cramping, nausea and vomiting. She was recently hospitalized on 03/16/2015 with similar symptoms. She has had extensive GI workup since June 2016 which was essentially negative per gastroenterology but thought that possibility includes chronic mesenteric ischemia. GI workup included CT abdomen on 03/09/2015 which was negative for acute findings. C. difficile at that time was negative. Gastric emptying study in June 2016 was unremarkable. MRA of the abdomen did not show evidence of mesenteric arterial occlusive disease. EGD and colonoscopy on 03/14/2015 showed no significant abnormalities other than polyps and mild diverticulosis.  On the admission, patient was hemodynamically stable. Blood work was notable for leukocytosis of 11.3, creatinine 3.61, calcium 10.5 and INR of 2.92.  Anticipated discharge: Home once GI symptoms improved, likely by 04/06/2015.  Assessment/Plan:    Principal problem: Generalized abdominal pain / nausea and vomiting - patient has had extensive GI workup on recent hospitalization and it was thought that most likely etiology is mesenteric ischemia. She is aware of this work up and GI has communicated to her that that was probably the reason for GI symptoms.  - Tolerates clears so far. Advance to regular diet. - Continue IV fluids until by mouth intake improves.   Active Problems: Acute renal failure superimposed on stage 4 chronic kidney disease /  metabolic acidosis - Baseline creatinine about one month ago is around 4.04. - Creatinine on this admission as high as 4.88, likely reflective of GI losses, nausea and vomiting - Creatinine improving with IV fluids. - Follow-up renal function tomorrow morning. - Continue sodium bicarbonate supplementation  Hypokalemia - Secondary to GI losses - Supplemented - Follow-up potassium level tomorrow morning.  Essential hypertension - Continue carvedilol 3.125 mg twice daily  Anemia of chronic disease - Secondary to anemia of chronic kidney disease - Hemoglobin stable  Leukocytosis - Likely reactive. No evidence of acute infection.  Dyslipidemia - Continue statin therapy  Diabetes mellitus 2 with renal manifestation - Continue Lantus 20 units daily along with sliding scale insulin - CBGs in past 24 hours: 79, 50, 850  Chronic systolic and diastolic CHF  - Compensated - 2-D echo in June 2016 showed EF of 30-35% no regional wall motion abnormalities  - Continue to hold Lasix until renal function improves.  Moderate protein calorie malnutrition - Likely because of chronic illness, poor by mouth intake, nausea and vomiting - Nutrition consulted  LV thrombus - Continue Coumadin per pharmacy   DVT Prophylaxis  - On anticoagulation with Coumadin   Code Status: Full.  Family Communication:  plan of care discussed with the patient Disposition Plan: Home when GI issues improve   IV access:  Peripheral IV  Procedures and diagnostic studies:    No results found.  Medical Consultants:  None  Other Consultants:  Nutrition  IAnti-Infectives:   None   Leisa Lenz, MD  [facility] Pager [phone]  Time spent in minutes: 15 minutes  If 7PM-7AM, please contact night-coverage [email or web address] Password [email or web address] 04/04/2015, 11:51 AM   LOS: 2 days  HPI/Subjective: No acute overnight events. Patient reports mild nausea but no vomiting.  Objective: Filed Vitals:    04/03/15 0800 04/03/15 1400 04/03/15 2156 04/04/15 0508  BP: 160/56 145/62 150/69 162/77  Pulse: 64 68 81 74  Temp: 98 F (36.7 C) 98.3 F (36.8 C) 99.2 F (37.3 C) 98.2 F (36.8 C)  TempSrc: Oral Oral Oral Oral  Resp: 18 16 16 18   Height:      Weight:      SpO2: 100% 100% 100% 97%    Intake/Output Summary (Last 24 hours) at 04/04/15 1151 Last data filed at 04/04/15 1000  Gross per 24 hour  Intake   2280 ml  Output    600 ml  Net   1680 ml    Exam:   General:  Pt is alert, follows commands appropriately, not in acute distress  Cardiovascular: Regular rate and rhythm, S1/S2, no murmurs  Respiratory: Clear to auscultation bilaterally, no wheezing, no crackles, no rhonchi  Abdomen: Soft, non tender, non distended, bowel sounds present  Extremities: No edema, pulses DP and PT palpable bilaterally  Neuro: Grossly nonfocal  Data Reviewed: Basic Metabolic Panel:  Recent Labs Lab 04/01/15 1203 04/02/15 1324 04/03/15 0516  NA 135 131* 136  K 3.7 4.2 3.1*  CL 101 97* 105  CO2 23 18* 21*  GLUCOSE 280* 191* 137*  BUN 37* 45* 38*  CREATININE 3.61* 4.88* 3.79*  CALCIUM 10.5* 9.8 9.2   Liver Function Tests:  Recent Labs Lab 04/01/15 1203 04/02/15 1324  AST 26 100*  ALT 15 41  ALKPHOS 159* 172*  BILITOT 0.6 1.0  PROT 7.1 7.1  ALBUMIN 2.8* 3.3*    Recent Labs Lab 04/01/15 1203 04/02/15 1324  LIPASE 21* 21*   No results for input(s): AMMONIA in the last 168 hours. CBC:  Recent Labs Lab 04/01/15 1203 04/02/15 1324 04/03/15 0516  WBC 11.3* 13.1* 11.8*  NEUTROABS  --  7.8*  --   HGB 15.2* 15.4* 13.8  HCT 45.4 46.2* 42.4  MCV 87.8 88.3 89.6  PLT 671* 595* 587*   Cardiac Enzymes: No results for input(s): CKTOTAL, CKMB, CKMBINDEX, TROPONINI in the last 168 hours. BNP: Invalid input(s): POCBNP CBG:  Recent Labs Lab 04/03/15 1607 04/03/15 1637 04/03/15 2129 04/04/15 0738 04/04/15 0845  GLUCAP 58* 88 79 50* 116*    Recent Results (from  the past 240 hour(s))  Urine culture     Status: None   Collection Time: 04/02/15  3:30 PM  Result Value Ref Range Status   Specimen Description URINE, CLEAN CATCH  Final   Special Requests NONE  Final   Culture   Final    MULTIPLE SPECIES PRESENT, SUGGEST RECOLLECTION Performed at [facility]    Report Status 04/04/2015 FINAL  Final     Scheduled Meds: . Apremilast  30 mg Oral Daily  . atorvastatin  20 mg Oral QHS  . carvedilol  3.125 mg Oral BID WC  . dicyclomine  10 mg Oral TID AC & HS  . feeding supplement (PRO-STAT SUGAR FREE 64)  30 mL Oral TID  . hydrocortisone  1 application Rectal QID  . insulin aspart  0-5 Units Subcutaneous QHS  . insulin aspart  0-9 Units Subcutaneous TID WC  . insulin glargine  20 Units Subcutaneous Daily  . saccharomyces boulardii  250 mg Oral BID  . sodium bicarbonate  650 mg Oral BID  . Warfarin - Pharmacist Dosing Inpatient   Does not apply q1800   Continuous  Infusions: . sodium chloride 75 mL/hr at 04/04/15 0252

## 2015-04-04 NOTE — Progress Notes (Signed)
ANTICOAGULATION CONSULT NOTE - Initial Consult  Pharmacy Consult for Coumadin Indication: LV thrombus  Allergies  Allergen Reactions  . Adhesive [Tape] Other (See Comments)    Blisters   . Avelox [Moxifloxacin Hcl In Nacl] Other (See Comments)    GI upset  . Codeine Other (See Comments)    "crazy"   . Guaifenesin Nausea And Vomiting    Takes Mucinex at home without issue  . Latex Swelling  . Oxycodone Nausea And Vomiting    Takes Percocet at home without issue    Patient Measurements: Height: 5\' 3"  (160 cm) Weight: 153 lb (69.4 kg) IBW/kg (Calculated) : 52.4  Vital Signs: Temp: 98.2 F (36.8 C) (09/02 0508) Temp Source: Oral (09/02 0508) BP: 162/77 mmHg (09/02 0508) Pulse Rate: 74 (09/02 0508)  Labs:  Recent Labs  04/01/15 1203  04/02/15 1324 04/02/15 1800 04/03/15 0516 04/04/15 0700  HGB 15.2*  --  15.4*  --  13.8  --   HCT 45.4  --  46.2*  --  42.4  --   PLT 671*  --  595*  --  587*  --   LABPROT  --   < >  --  37.1* 37.6* 42.9*  INR  --   < >  --  3.87* 3.95* 4.70*  CREATININE 3.61*  --  4.88*  --  3.79*  --   < > = values in this interval not displayed.  Estimated Creatinine Clearance: 13.5 mL/min (by C-G formula based on Cr of 3.79).   Medical History: Past Medical History  Diagnosis Date  . Essential hypertension, benign   . Coronary atherosclerosis of native coronary artery 01/04/2006    Tiny OM1 70-90% ostial stenosis, no other CAD  . COPD (chronic obstructive pulmonary disease)   . Esophageal reflux   . Dyslipidemia   . Gout   . Colon polyps 2011    Tubular adenomatous polyps  . Spinal stenosis of lumbar region     chronic low back pain.   . Bulging lumbar disc   . Chronic diastolic heart failure   . PNA (pneumonia) 2012    With pleural effusion, requiring thoracentesis  . Chronic bronchitis   . On home oxygen therapy   . OSA (obstructive sleep apnea)     Marginally compliant with CPAP  . Type II diabetes mellitus   . Arthritis   .  Diverticulosis   . GERD (gastroesophageal reflux disease)   . Anxiety   . Hyperlipidemia   . Sarcoidosis of lung   . CKD (chronic kidney disease) stage 4, GFR 15-29 ml/min   . IBS (irritable bowel syndrome)   . Cataracts, both eyes   . Headache(784.0)   . Lactic acidosis 01/21/2015  . Chronic CHF (congestive heart failure)     Medications:  Prescriptions prior to admission  Medication Sig Dispense Refill Last Dose  . acetaminophen (TYLENOL) 500 MG tablet Take 1,000 mg by mouth 2 (two) times daily as needed (pain).    Past Month at Unknown time  . albuterol (PROVENTIL HFA;VENTOLIN HFA) 108 (90 BASE) MCG/ACT inhaler Inhale 2 puffs into the lungs every 4 (four) hours as needed for wheezing or shortness of breath.    PRN  . albuterol (PROVENTIL) (2.5 MG/3ML) 0.083% nebulizer solution Take 2.5 mg by nebulization every 2 (two) hours as needed for wheezing or shortness of breath.   PRN  . Alpha-D-Galactosidase (BEANO PO) Take 1 tablet by mouth daily.   04/02/2015 at Unknown time  . Apremilast (  OTEZLA) 30 MG TABS Take 30 mg by mouth daily.    04/02/2015 at Unknown time  . atorvastatin (LIPITOR) 20 MG tablet Take 20 mg by mouth at bedtime.   04/01/2015 at Unknown time  . esomeprazole (NEXIUM) 40 MG capsule Take 1 capsule by mouth 30 min before breakfast. (Patient taking differently: Take 40 mg by mouth daily. ) 30 capsule 6 04/02/2015 at Unknown time  . famotidine (PEPCID) 20 MG tablet Take 1 tablet (20 mg total) by mouth daily. 30 tablet 3 04/02/2015 at Unknown time  . feeding supplement (BOOST / RESOURCE BREEZE) LIQD Take 1 Container by mouth 3 (three) times daily between meals. 90 Container 0 04/02/2015 at Unknown time  . ferrous sulfate 325 (65 FE) MG tablet Take 325 mg by mouth daily with breakfast.   04/02/2015 at Unknown time  . fluticasone (FLONASE) 50 MCG/ACT nasal spray Place 2 sprays into both nostrils daily as needed for allergies or rhinitis.    PRN  . Fluticasone-Salmeterol (ADVAIR) 250-50  MCG/DOSE AEPB Inhale 1 puff into the lungs 2 (two) times daily. 180 each 3 04/02/2015 at Unknown time  . furosemide (LASIX) 80 MG tablet Take 80 mg (2 tablets) in am and 40 mg (1 tablet) in pm 90 tablet 6 04/02/2015 at Unknown time  . hydrocortisone (ANUSOL-HC) 2.5 % rectal cream Place 1 application rectally 4 (four) times daily. 30 g 0 Past Week at Unknown time  . insulin lispro (HUMALOG KWIKPEN) 100 UNIT/ML KiwkPen Inject 0.01-0.1 mLs (1-10 Units total) into the skin 3 (three) times daily. Sliding scale CBG 70 - 120: 0 units CBG 121 - 150: 1 unit,  CBG 151 - 200: 2 units,  CBG 201 - 250: 3 units,  CBG 251 - 300: 5 units,  CBG 301 - 350: 7 units,  CBG 351 - 400: 9 units   CBG > 400: 10 units and notify your MD 15 mL 3 04/01/2015 at Unknown time  . loperamide (IMODIUM) 2 MG capsule Take 2 capsules (4 mg total) by mouth daily. 30 capsule 0 04/01/2015 at Unknown time  . Multiple Vitamins-Minerals (MULTIVITAMIN WITH MINERALS) tablet Take 1 tablet by mouth daily.   04/02/2015 at Unknown time  . ondansetron (ZOFRAN) 4 MG tablet Take 1 tablet (4 mg total) by mouth every 8 (eight) hours as needed for nausea or vomiting. 90 tablet 3 04/02/2015 at Unknown time  . promethazine (PHENERGAN) 25 MG tablet Take 1 tablet (25 mg total) by mouth every 4 (four) hours as needed for nausea or vomiting. 30 tablet 0 04/02/2015 at Unknown time  . saccharomyces boulardii (FLORASTOR) 250 MG capsule Take 1 capsule (250 mg total) by mouth 2 (two) times daily. 60 capsule 3 04/02/2015 at Unknown time  . sodium bicarbonate 650 MG tablet Take 1 tablet (650 mg total) by mouth 2 (two) times daily. 60 tablet 3 04/02/2015 at Unknown time  . TOUJEO SOLOSTAR 300 UNIT/ML SOPN Inject 40 Units into the skin every morning.  3 04/02/2015 at Unknown time  . triamcinolone cream (KENALOG) 0.1 % Apply 1 application topically 2 (two) times daily.   04/01/2015 at Unknown time  . warfarin (COUMADIN) 1 MG tablet Take 1.5 tablets (1.5 mg total) by mouth every  evening. Take 3 mg today -03/06/15-and then resume 1.5 mg from 8/5 45 tablet 1 04/01/2015 at Unknown time    Assessment: 69 yo F on chronic Coumadin 1.5mg  daily for hx apical thrombus.  INR is supra-therapeutic on admission.  She was seen in ED  yesterday and INR was therapeutic at upper end of goal range, however per outpatient flowsheet INR was 3.1 on 8/30 and she was instructed to take only 0.5mg  yesterday. INR 2.92-->3.87.  Last dose taken on 8/30. No bleeding noted.  She is admitted with abdominal pain and acute renal failure.    Today, 04/04/2015:  INR remains elevated and trending up with 4.70 today-- coumadin on hold since admission.  Poor oral intake and n/v are likely contributing to rising INR  CBC ok on 9/01, No new CBC today  Per patient, no bleeding noted  Drug-drug intxn: none  Diet: clear liquid (poor oral intake d/t n/v)  Scr elevated at 3.79 on 9/01  Goal of Therapy:  INR 2-3   Plan:  - continue to hold Coumadin today - Daily INR  Sheyann Sulton P 04/04/2015,10:15 AM

## 2015-04-05 LAB — CBC
HEMATOCRIT: 39.6 % (ref 36.0–46.0)
Hemoglobin: 12.4 g/dL (ref 12.0–15.0)
MCH: 28.8 pg (ref 26.0–34.0)
MCHC: 31.3 g/dL (ref 30.0–36.0)
MCV: 92.1 fL (ref 78.0–100.0)
Platelets: 477 10*3/uL — ABNORMAL HIGH (ref 150–400)
RBC: 4.3 MIL/uL (ref 3.87–5.11)
RDW: 17.1 % — AB (ref 11.5–15.5)
WBC: 8.3 10*3/uL (ref 4.0–10.5)

## 2015-04-05 LAB — PROTIME-INR
INR: 3.45 — ABNORMAL HIGH (ref 0.00–1.49)
Prothrombin Time: 34 seconds — ABNORMAL HIGH (ref 11.6–15.2)

## 2015-04-05 LAB — BASIC METABOLIC PANEL
Anion gap: 6 (ref 5–15)
BUN: 36 mg/dL — ABNORMAL HIGH (ref 6–20)
CALCIUM: 9 mg/dL (ref 8.9–10.3)
CO2: 21 mmol/L — AB (ref 22–32)
CREATININE: 2.45 mg/dL — AB (ref 0.44–1.00)
Chloride: 109 mmol/L (ref 101–111)
GFR calc non Af Amer: 19 mL/min — ABNORMAL LOW (ref >60)
GFR, EST AFRICAN AMERICAN: 22 mL/min — AB (ref >60)
GLUCOSE: 135 mg/dL — AB (ref 65–99)
Potassium: 4.3 mmol/L (ref 3.5–5.1)
Sodium: 136 mmol/L (ref 135–145)

## 2015-04-05 LAB — GLUCOSE, CAPILLARY
GLUCOSE-CAPILLARY: 95 mg/dL (ref 65–99)
Glucose-Capillary: 108 mg/dL — ABNORMAL HIGH (ref 65–99)
Glucose-Capillary: 107 mg/dL — ABNORMAL HIGH (ref 65–99)
Glucose-Capillary: 141 mg/dL — ABNORMAL HIGH (ref 65–99)

## 2015-04-05 MED ORDER — ALUM & MAG HYDROXIDE-SIMETH 200-200-20 MG/5ML PO SUSP
15.0000 mL | ORAL | Status: DC | PRN
  Administered 2015-04-05 – 2015-04-06 (×2): 15 mL via ORAL
  Filled 2015-04-05 (×2): qty 30

## 2015-04-05 NOTE — Progress Notes (Signed)
ANTICOAGULATION CONSULT NOTE - Follow-up Consult  Pharmacy Consult for Coumadin Indication: LV thrombus  Allergies  Allergen Reactions  . Adhesive [Tape] Other (See Comments)    Blisters   . Avelox [Moxifloxacin Hcl In Nacl] Other (See Comments)    GI upset  . Codeine Other (See Comments)    "crazy"   . Guaifenesin Nausea And Vomiting    Takes Mucinex at home without issue  . Latex Swelling  . Oxycodone Nausea And Vomiting    Takes Percocet at home without issue    Patient Measurements: Height: 5\' 3"  (160 cm) Weight: 153 lb (69.4 kg) IBW/kg (Calculated) : 52.4  Vital Signs: Temp: 97.9 F (36.6 C) (09/03 0515) Temp Source: Oral (09/03 0515) BP: 159/102 mmHg (09/03 0944) Pulse Rate: 68 (09/03 0944)  Labs:  Recent Labs  04/03/15 0516 04/04/15 0700 04/05/15 0516 04/05/15 1008  HGB 13.8  --   --  12.4  HCT 42.4  --   --  39.6  PLT 587*  --   --  477*  LABPROT 37.6* 42.9* 34.0*  --   INR 3.95* 4.70* 3.45*  --   CREATININE 3.79*  --   --  2.45*    Estimated Creatinine Clearance: 20.8 mL/min (by C-G formula based on Cr of 2.45).   Medical History: Past Medical History  Diagnosis Date  . Essential hypertension, benign   . Coronary atherosclerosis of native coronary artery 01/04/2006    Tiny OM1 70-90% ostial stenosis, no other CAD  . COPD (chronic obstructive pulmonary disease)   . Esophageal reflux   . Dyslipidemia   . Gout   . Colon polyps 2011    Tubular adenomatous polyps  . Spinal stenosis of lumbar region     chronic low back pain.   . Bulging lumbar disc   . Chronic diastolic heart failure   . PNA (pneumonia) 2012    With pleural effusion, requiring thoracentesis  . Chronic bronchitis   . On home oxygen therapy   . OSA (obstructive sleep apnea)     Marginally compliant with CPAP  . Type II diabetes mellitus   . Arthritis   . Diverticulosis   . GERD (gastroesophageal reflux disease)   . Anxiety   . Hyperlipidemia   . Sarcoidosis of lung   .  CKD (chronic kidney disease) stage 4, GFR 15-29 ml/min   . IBS (irritable bowel syndrome)   . Cataracts, both eyes   . Headache(784.0)   . Lactic acidosis 01/21/2015  . Chronic CHF (congestive heart failure)     Medications:  Prescriptions prior to admission  Medication Sig Dispense Refill Last Dose  . acetaminophen (TYLENOL) 500 MG tablet Take 1,000 mg by mouth 2 (two) times daily as needed (pain).    Past Month at Unknown time  . albuterol (PROVENTIL HFA;VENTOLIN HFA) 108 (90 BASE) MCG/ACT inhaler Inhale 2 puffs into the lungs every 4 (four) hours as needed for wheezing or shortness of breath.    PRN  . albuterol (PROVENTIL) (2.5 MG/3ML) 0.083% nebulizer solution Take 2.5 mg by nebulization every 2 (two) hours as needed for wheezing or shortness of breath.   PRN  . Alpha-D-Galactosidase (BEANO PO) Take 1 tablet by mouth daily.   04/02/2015 at Unknown time  . Apremilast (OTEZLA) 30 MG TABS Take 30 mg by mouth daily.    04/02/2015 at Unknown time  . atorvastatin (LIPITOR) 20 MG tablet Take 20 mg by mouth at bedtime.   04/01/2015 at Unknown time  .  esomeprazole (NEXIUM) 40 MG capsule Take 1 capsule by mouth 30 min before breakfast. (Patient taking differently: Take 40 mg by mouth daily. ) 30 capsule 6 04/02/2015 at Unknown time  . famotidine (PEPCID) 20 MG tablet Take 1 tablet (20 mg total) by mouth daily. 30 tablet 3 04/02/2015 at Unknown time  . feeding supplement (BOOST / RESOURCE BREEZE) LIQD Take 1 Container by mouth 3 (three) times daily between meals. 90 Container 0 04/02/2015 at Unknown time  . ferrous sulfate 325 (65 FE) MG tablet Take 325 mg by mouth daily with breakfast.   04/02/2015 at Unknown time  . fluticasone (FLONASE) 50 MCG/ACT nasal spray Place 2 sprays into both nostrils daily as needed for allergies or rhinitis.    PRN  . Fluticasone-Salmeterol (ADVAIR) 250-50 MCG/DOSE AEPB Inhale 1 puff into the lungs 2 (two) times daily. 180 each 3 04/02/2015 at Unknown time  . furosemide (LASIX)  80 MG tablet Take 80 mg (2 tablets) in am and 40 mg (1 tablet) in pm 90 tablet 6 04/02/2015 at Unknown time  . hydrocortisone (ANUSOL-HC) 2.5 % rectal cream Place 1 application rectally 4 (four) times daily. 30 g 0 Past Week at Unknown time  . insulin lispro (HUMALOG KWIKPEN) 100 UNIT/ML KiwkPen Inject 0.01-0.1 mLs (1-10 Units total) into the skin 3 (three) times daily. Sliding scale CBG 70 - 120: 0 units CBG 121 - 150: 1 unit,  CBG 151 - 200: 2 units,  CBG 201 - 250: 3 units,  CBG 251 - 300: 5 units,  CBG 301 - 350: 7 units,  CBG 351 - 400: 9 units   CBG > 400: 10 units and notify your MD 15 mL 3 04/01/2015 at Unknown time  . loperamide (IMODIUM) 2 MG capsule Take 2 capsules (4 mg total) by mouth daily. 30 capsule 0 04/01/2015 at Unknown time  . Multiple Vitamins-Minerals (MULTIVITAMIN WITH MINERALS) tablet Take 1 tablet by mouth daily.   04/02/2015 at Unknown time  . ondansetron (ZOFRAN) 4 MG tablet Take 1 tablet (4 mg total) by mouth every 8 (eight) hours as needed for nausea or vomiting. 90 tablet 3 04/02/2015 at Unknown time  . promethazine (PHENERGAN) 25 MG tablet Take 1 tablet (25 mg total) by mouth every 4 (four) hours as needed for nausea or vomiting. 30 tablet 0 04/02/2015 at Unknown time  . saccharomyces boulardii (FLORASTOR) 250 MG capsule Take 1 capsule (250 mg total) by mouth 2 (two) times daily. 60 capsule 3 04/02/2015 at Unknown time  . sodium bicarbonate 650 MG tablet Take 1 tablet (650 mg total) by mouth 2 (two) times daily. 60 tablet 3 04/02/2015 at Unknown time  . TOUJEO SOLOSTAR 300 UNIT/ML SOPN Inject 40 Units into the skin every morning.  3 04/02/2015 at Unknown time  . triamcinolone cream (KENALOG) 0.1 % Apply 1 application topically 2 (two) times daily.   04/01/2015 at Unknown time  . warfarin (COUMADIN) 1 MG tablet Take 1.5 tablets (1.5 mg total) by mouth every evening. Take 3 mg today -03/06/15-and then resume 1.5 mg from 8/5 45 tablet 1 04/01/2015 at Unknown time    Assessment: 69 yo F  on chronic Coumadin 1.5mg  daily for hx apical thrombus.  INR is supra-therapeutic on admission.  She was seen in ED yesterday and INR was therapeutic at upper end of goal range, however per outpatient flowsheet INR was 3.1 on 8/30 and she was instructed to take only 0.5mg  yesterday. INR 2.92-->3.87.  Last dose taken on 8/30. No bleeding  noted.  She is admitted with abdominal pain and acute renal failure.    Today, 04/05/2015:  INR remains supratherapeutic, coumadin on hold since admission.  Poor oral intake and n/v are likely contributing to elevated INR  CBC ok   Per RN, no bleeding noted  Drug-drug intxn: none  Diet: carb modified diet (poor oral intake d/t n/v)  Scr elevated but improving  Goal of Therapy:  INR 2-3   Plan:  - Continue to hold Coumadin today - Daily INR  Ralene Bathe, PharmD, BCPS 04/05/2015, 2:55 PM  Pager: [phone]

## 2015-04-05 NOTE — Progress Notes (Signed)
Patient ID: [name], female   DOB: 1946/01/15, 69 y.o.   MRN: [id number] [facility] PROGRESS NOTE  [name] OQH:[id number] DOB: August 26, 1945 DOA: 04/02/2015 PCP: Antony Blackbird, MD  Brief narrative:    69 -year-old female with past medical history of COPD on home oxygen, 2 L at baseline, chronic kidney disease stage IV, chronic systolic and diastolic CHF, left ventricular thrombus (on anticoagulation with Coumadin), diabetes type 2, sarcoidosis who presented to [facility] ED with reports of abdominal cramping, nausea and vomiting. She was recently hospitalized on 03/16/2015 with similar symptoms. She has had extensive GI workup since June 2016 which was essentially negative per gastroenterology but thought that possibility includes chronic mesenteric ischemia. GI workup included CT abdomen on 03/09/2015 which was negative for acute findings. C. difficile at that time was negative. Gastric emptying study in June 2016 was unremarkable. MRA of the abdomen did not show evidence of mesenteric arterial occlusive disease. EGD and colonoscopy on 03/14/2015 showed no significant abnormalities other than polyps and mild diverticulosis.  On the admission, patient was hemodynamically stable. Blood work was notable for leukocytosis of 11.3, creatinine 3.61, calcium 10.5 and INR of 2.92.  Anticipated discharge: Home 04/06/2015.  Assessment/Plan:    Principal problem: Generalized abdominal pain / nausea and vomiting - patient has had extensive GI workup on recent hospitalization and it was thought that most likely etiology is mesenteric ischemia. She is aware of this work up and GI has communicated to her that that was probably the reason for GI symptoms.  - Pt tolerated regular diet this am but still has nausea  - Will observe for next 24 hours  - D/C IV fluids   Active Problems: Acute renal failure superimposed on stage 4 chronic kidney disease / metabolic acidosis - Baseline  creatinine about one month ago is around 4.04. - Creatinine on this admission as high as 4.88, likely reflective of GI losses, nausea and vomiting - Renal function improved with IV fluids to 2.45 - Follow BMP tomorrow am - Continue sodium bicarb  Hypokalemia - Secondary to GI losses - Supplemented and WNL  Essential hypertension - Continue carvedilol 3.125 mg twice daily - BP 159/102; add hydralazine PRN for better BP control (for BP above 150/90)  Anemia of chronic disease - Secondary to anemia of chronic kidney disease - Hemoglobin stable - No indications for transfusion   Leukocytosis - Likely reactive. No evidence of acute infection. - Resolved spontaneously  Dyslipidemia - Continue statin therapy  Diabetes mellitus 2 with renal manifestation - Continue Lantus 20 units daily and sliding scale insulin  Chronic systolic and diastolic CHF  - Compensated; stable respiratory status  - 2-D echo in June 2016 showed EF of 30-35% no regional wall motion abnormalities  - Continue to hold Lasix until renal function even better   Moderate protein calorie malnutrition - Likely because of chronic illness, poor by mouth intake, nausea and vomiting - Nutrition consulted  LV thrombus - Continue Coumadin per pharmacy   DVT Prophylaxis  - On anticoagulation with Coumadin, dosing per pharmacy    Code Status: Full.  Family Communication:  plan of care discussed with the patient Disposition Plan: Home by 04/06/2015   IV access:  Peripheral IV  Procedures and diagnostic studies:    No results found.  Medical Consultants:  None  Other Consultants:  Nutrition  IAnti-Infectives:   None   Leisa Lenz, MD  [facility] Pager [phone]  Time spent in minutes: 25 minutes  If 7PM-7AM, please contact night-coverage [email or web address] Password [email or web address] 04/05/2015, 12:06 PM   LOS: 3 days    HPI/Subjective: No acute overnight events. Patient reports she tolerated regular  diet but still feels nausea.   Objective: Filed Vitals:   04/04/15 1400 04/04/15 2130 04/05/15 0515 04/05/15 0944  BP: 143/70 125/71 163/74 159/102  Pulse: 80 53 57 68  Temp: 98.3 F (36.8 C) 97.7 F (36.5 C) 97.9 F (36.6 C)   TempSrc: Oral Oral Oral   Resp: 18 18 18    Height:      Weight:      SpO2: 98% 100% 100%     Intake/Output Summary (Last 24 hours) at 04/05/15 1206 Last data filed at 04/05/15 1000  Gross per 24 hour  Intake   3180 ml  Output   3050 ml  Net    130 ml    Exam:   General:  Pt is alert, not in acute distress  Cardiovascular: Regular rate and rhythm, S1/S2 (+)  Respiratory: No wheezing, no crackles, no rhonchi  Abdomen: Non tender, non distended, (+) BS  Extremities: No leg swelling, pulses palpable   Neuro: Nonfocal  Data Reviewed: Basic Metabolic Panel:  Recent Labs Lab 04/01/15 1203 04/02/15 1324 04/03/15 0516 04/05/15 1008  NA 135 131* 136 136  K 3.7 4.2 3.1* 4.3  CL 101 97* 105 109  CO2 23 18* 21* 21*  GLUCOSE 280* 191* 137* 135*  BUN 37* 45* 38* 36*  CREATININE 3.61* 4.88* 3.79* 2.45*  CALCIUM 10.5* 9.8 9.2 9.0   Liver Function Tests:  Recent Labs Lab 04/01/15 1203 04/02/15 1324  AST 26 100*  ALT 15 41  ALKPHOS 159* 172*  BILITOT 0.6 1.0  PROT 7.1 7.1  ALBUMIN 2.8* 3.3*    Recent Labs Lab 04/01/15 1203 04/02/15 1324  LIPASE 21* 21*   No results for input(s): AMMONIA in the last 168 hours. CBC:  Recent Labs Lab 04/01/15 1203 04/02/15 1324 04/03/15 0516 04/05/15 1008  WBC 11.3* 13.1* 11.8* 8.3  NEUTROABS  --  7.8*  --   --   HGB 15.2* 15.4* 13.8 12.4  HCT 45.4 46.2* 42.4 39.6  MCV 87.8 88.3 89.6 92.1  PLT 671* 595* 587* 477*   Cardiac Enzymes: No results for input(s): CKTOTAL, CKMB, CKMBINDEX, TROPONINI in the last 168 hours. BNP: Invalid input(s): POCBNP CBG:  Recent Labs Lab 04/04/15 1151 04/04/15 1649 04/04/15 1748 04/04/15 2232 04/05/15 0750  GLUCAP 95 102* 86 100* 95    Recent  Results (from the past 240 hour(s))  Urine culture     Status: None   Collection Time: 04/02/15  3:30 PM  Result Value Ref Range Status   Specimen Description URINE, CLEAN CATCH  Final   Special Requests NONE  Final   Culture   Final    MULTIPLE SPECIES PRESENT, SUGGEST RECOLLECTION Performed at [facility]    Report Status 04/04/2015 FINAL  Final     Scheduled Meds: . Apremilast  30 mg Oral Daily  . atorvastatin  20 mg Oral QHS  . carvedilol  3.125 mg Oral BID WC  . dicyclomine  10 mg Oral TID AC & HS  . feeding supplement (PRO-STAT SUGAR FREE 64)  30 mL Oral TID  . hydrocortisone  1 application Rectal QID  . insulin aspart  0-5 Units Subcutaneous QHS  . insulin aspart  0-9 Units Subcutaneous TID WC  . insulin glargine  20 Units Subcutaneous Daily  . saccharomyces boulardii  250 mg  Oral BID  . sodium bicarbonate  650 mg Oral BID  . Warfarin - Pharmacist Dosing Inpatient   Does not apply q1800   Continuous Infusions: . sodium chloride 1,000 mL (04/05/15 0501)

## 2015-04-05 NOTE — Discharge Instructions (Signed)

## 2015-04-05 NOTE — Plan of Care (Signed)
Problem: Phase I Progression Outcomes Goal: Hemodynamically stable Outcome: Not Met (add Reason) Hypertensive; receiving Coreg

## 2015-04-06 DIAGNOSIS — I10 Essential (primary) hypertension: Secondary | ICD-10-CM

## 2015-04-06 LAB — BASIC METABOLIC PANEL
Anion gap: 6 (ref 5–15)
BUN: 40 mg/dL — AB (ref 6–20)
CHLORIDE: 108 mmol/L (ref 101–111)
CO2: 22 mmol/L (ref 22–32)
CREATININE: 2.5 mg/dL — AB (ref 0.44–1.00)
Calcium: 9.2 mg/dL (ref 8.9–10.3)
GFR calc Af Amer: 22 mL/min — ABNORMAL LOW (ref >60)
GFR calc non Af Amer: 19 mL/min — ABNORMAL LOW (ref >60)
GLUCOSE: 108 mg/dL — AB (ref 65–99)
POTASSIUM: 4 mmol/L (ref 3.5–5.1)
Sodium: 136 mmol/L (ref 135–145)

## 2015-04-06 LAB — PROTIME-INR
INR: 2.16 — AB (ref 0.00–1.49)
Prothrombin Time: 23.9 seconds — ABNORMAL HIGH (ref 11.6–15.2)

## 2015-04-06 LAB — TISSUE TRANSGLUTAMINASE, IGA: Tissue Transglutaminase Ab, IgA: <2 U/mL (ref 0–3)

## 2015-04-06 LAB — GLUCOSE, CAPILLARY
GLUCOSE-CAPILLARY: 163 mg/dL — AB (ref 65–99)
Glucose-Capillary: 102 mg/dL — ABNORMAL HIGH (ref 65–99)

## 2015-04-06 MED ORDER — ONDANSETRON HCL 4 MG PO TABS: 4.0000 mg | ORAL_TABLET | Freq: Three times a day (TID) | ORAL | Status: DC | PRN

## 2015-04-06 MED ORDER — FUROSEMIDE 80 MG PO TABS: 80.0000 mg | ORAL_TABLET | Freq: Every day | ORAL | Status: DC

## 2015-04-06 MED ORDER — HYDROCODONE-ACETAMINOPHEN 5-325 MG PO TABS: 1.0000 | ORAL_TABLET | ORAL | Status: DC | PRN

## 2015-04-06 MED ORDER — WARFARIN SODIUM 1 MG PO TABS: 1.5000 mg | ORAL_TABLET | Freq: Every evening | ORAL | Status: DC

## 2015-04-06 MED ORDER — CARVEDILOL 3.125 MG PO TABS: 3.1250 mg | ORAL_TABLET | Freq: Two times a day (BID) | ORAL | Status: DC

## 2015-04-06 MED ORDER — DICYCLOMINE HCL 10 MG PO CAPS: 10.0000 mg | ORAL_CAPSULE | Freq: Three times a day (TID) | ORAL | Status: DC

## 2015-04-06 NOTE — Care Management Note (Signed)
Case Management Note  Patient Details  Name: [name] MRN: [id number] Date of Birth: February 16, 1946  Subjective/Objective:     abd pain               Action/Plan: Pt active with [facility] for HHPT, OT, RN and SW. [facility] with resumption of care.   See previous NCM notes  Expected Discharge Date:  04/05/2015               Expected Discharge Plan:  [place]  In-House Referral:     Discharge planning Services  CM Consult  Post Acute Care Choice:  Home Health, Resumption of Svcs/PTA Provider Choice offered to:  Patient    HH Arranged:  RN, PT, OT [facility] Agency:  [place]  Status of Service:  Completed, signed off  Medicare Important Message Given:    Date Medicare IM Given:    Medicare IM give by:    Date Additional Medicare IM Given:    Additional Medicare Important Message give by:     If discussed at [place] of Stay Meetings, dates discussed:    Additional Comments:  Erenest Rasher, RN 04/06/2015, 5:35 PM

## 2015-04-06 NOTE — Discharge Summary (Addendum)
Physician Discharge Summary  [name] [id number]:[id number] DOB: October 02, 1945 DOA: 04/02/2015  PCP: Antony Blackbird, MD  Admit date: 04/02/2015 Discharge date: 04/06/2015  Recommendations for Outpatient Follow-up:  Take 1.5 mg coumadin daily. INR is 2.1 prior to discharge. Continue to monitor coumadin per [facility]. Goal INR 2-3.  Take insulin as per prior to the admission.  Take laisx 80 mg daily instead of twice a day until kidney function better. Creatinine is 2.5 prior to discharge. On admission it was as high as 4.8.  Discharge Diagnoses:  Principal Problem:   ABDOMINAL PAIN -GENERALIZED Active Problems:   Essential hypertension   HYPERCHOLESTEROLEMIA   Diabetes mellitus with renal manifestation   Dyslipidemia   Acute renal failure superimposed on stage 4 chronic kidney disease   Nausea and vomiting   Dehydration   Abdominal pain   Malnutrition of moderate degree    Discharge Condition: stable   Diet recommendation: as tolerated   History of present illness:  69 -year-old female with past medical history of COPD on home oxygen, 2 L at baseline, chronic kidney disease stage IV, chronic systolic and diastolic CHF, left ventricular thrombus (on anticoagulation with Coumadin), diabetes type 2, sarcoidosis who presented to [facility] ED with reports of abdominal cramping, nausea and vomiting. She was recently hospitalized on 03/16/2015 with similar symptoms. She has had extensive GI workup since June 2016 which was essentially negative per gastroenterology but thought that possibility includes chronic mesenteric ischemia. GI workup included CT abdomen on 03/09/2015 which was negative for acute findings. C. difficile at that time was negative. Gastric emptying study in June 2016 was unremarkable. MRA of the abdomen did not show evidence of mesenteric arterial occlusive disease. EGD and colonoscopy on 03/14/2015 showed no significant abnormalities other than polyps and mild  diverticulosis.  On the admission, patient was hemodynamically stable. Blood work was notable for leukocytosis of 11.3, creatinine 3.61, calcium 10.5 and INR of 2.92.   Hospital Course:   Assessment/Plan:    Principal problem: Generalized abdominal pain / nausea and vomiting - Patient has had extensive GI workup on recent hospitalization and it was thought that most likely etiology is mesenteric ischemia. She is aware of this work up and GI has communicated to her that that was probably the reason for GI symptoms.  - Pt tolerated regular diet in past 48 hours - Stable for discharge today  Active Problems: Acute renal failure superimposed on stage 4 chronic kidney disease / metabolic acidosis - Baseline creatinine about one month ago is around 4.04. - Creatinine on this admission as high as 4.88, likely reflective of GI losses, nausea and vomiting and lasix - Renal function improved with IV fluids to 2.5 - Will change lasix to 80 mg daily only instead of twice a day (se was taking 40 mg at bedtime - but will hold it until renal function improves) - Continue sodium bicarbonate   Hypokalemia - Secondary to GI losses - Supplemented and WNL  Essential hypertension - Continue carvedilol 3.125 mg twice daily and resume lasix 80 mg daily   Anemia of chronic disease - Secondary to anemia of chronic kidney disease - Hemoglobin stable  Leukocytosis - Likely reactive. No evidence of acute infection. - Resolved spontaneously  Dyslipidemia - Continue statin therapy  Diabetes mellitus 2 with renal manifestation - Continue insulin per prior home regimen  Chronic systolic and diastolic CHF  - Compensated; stable respiratory status  - 2-D echo in June 2016 showed EF of 30-35% no  regional wall motion abnormalities  - Continue Lasix 80 mg daily  Moderate protein calorie malnutrition - Likely because of chronic illness, poor by mouth intake, nausea and vomiting - Nutrition  consulted  LV thrombus - Continue Coumadin per pharmacy   DVT Prophylaxis  - On anticoagulation with Coumadin   Code Status: Full.  Family Communication: plan of care discussed with the patient   IV access:  Peripheral IV  Procedures and diagnostic studies:   No results found.  Medical Consultants:  None  Other Consultants:  Nutrition  IAnti-Infectives:   None    Signed:  Leisa Lenz, MD  [facility] 04/06/2015, 9:04 AM  Pager #: [phone]  Time spent in minutes: more than 30 minutes  Discharge Exam: Filed Vitals:   04/06/15 0620  BP: 157/79  Pulse: 73  Temp: 98 F (36.7 C)  Resp: 16   Filed Vitals:   04/05/15 1400 04/05/15 1801 04/05/15 2215 04/06/15 0620  BP: 141/86 168/107 147/72 157/79  Pulse: 69 58 61 73  Temp: 98.6 F (37 C)  98.1 F (36.7 C) 98 F (36.7 C)  TempSrc: Oral  Oral Oral  Resp: 18  16 16   Height:      Weight:      SpO2: 100%  100% 100%    General: Pt is alert, follows commands appropriately, not in acute distress Cardiovascular: Regular rate and rhythm, S1/S2 +, no murmurs Respiratory: Clear to auscultation bilaterally, no wheezing, no crackles, no rhonchi Abdominal: Soft, non tender, non distended, bowel sounds +, no guarding Extremities: no edema, no cyanosis, pulses palpable bilaterally DP and PT Neuro: Grossly nonfocal  Discharge Instructions  Discharge Instructions    Call MD for:  difficulty breathing, headache or visual disturbances    Complete by:  As directed      Call MD for:  persistant nausea and vomiting    Complete by:  As directed      Call MD for:  severe uncontrolled pain    Complete by:  As directed      Diet - low sodium heart healthy    Complete by:  As directed      Discharge instructions    Complete by:  As directed   Take 1.5 mg coumadin daily. INR is 2.1 prior to discharge. Continue to monitor coumadin per [facility]. Goal INR 2-3.  Take insulin as per  prior to the admission.  Take laisx 80 mg daily instead of twice a day until kidney function better. Creatinine is 2.5 prior to discharge. On admission it was as high as 4.8.     Increase activity slowly    Complete by:  As directed             Medication List    STOP taking these medications        loperamide 2 MG capsule  Commonly known as:  IMODIUM      TAKE these medications        acetaminophen 500 MG tablet  Commonly known as:  TYLENOL  Take 1,000 mg by mouth 2 (two) times daily as needed (pain).     albuterol (2.5 MG/3ML) 0.083% nebulizer solution  Commonly known as:  PROVENTIL  Take 2.5 mg by nebulization every 2 (two) hours as needed for wheezing or shortness of breath.     albuterol 108 (90 BASE) MCG/ACT inhaler  Commonly known as:  PROVENTIL HFA;VENTOLIN HFA  Inhale 2 puffs into the lungs every 4 (four) hours as needed  for wheezing or shortness of breath.     atorvastatin 20 MG tablet  Commonly known as:  LIPITOR  Take 20 mg by mouth at bedtime.     BEANO PO  Take 1 tablet by mouth daily.     carvedilol 3.125 MG tablet  Commonly known as:  COREG  Take 1 tablet (3.125 mg total) by mouth 2 (two) times daily with a meal.     dicyclomine 10 MG capsule  Commonly known as:  BENTYL  Take 1 capsule (10 mg total) by mouth 4 (four) times daily -  before meals and at bedtime.     esomeprazole 40 MG capsule  Commonly known as:  NEXIUM  Take 1 capsule by mouth 30 min before breakfast.     famotidine 20 MG tablet  Commonly known as:  PEPCID  Take 1 tablet (20 mg total) by mouth daily.     feeding supplement Liqd  Take 1 Container by mouth 3 (three) times daily between meals.     ferrous sulfate 325 (65 FE) MG tablet  Take 325 mg by mouth daily with breakfast.     fluticasone 50 MCG/ACT nasal spray  Commonly known as:  FLONASE  Place 2 sprays into both nostrils daily as needed for allergies or rhinitis.     Fluticasone-Salmeterol 250-50 MCG/DOSE Aepb   Commonly known as:  ADVAIR  Inhale 1 puff into the lungs 2 (two) times daily.     furosemide 80 MG tablet  Commonly known as:  LASIX  Take 1 tablet (80 mg total) by mouth daily. Take 80 mg (2 tablets) in am and 40 mg (1 tablet) in pm     HYDROcodone-acetaminophen 5-325 MG per tablet  Commonly known as:  NORCO/VICODIN  Take 1-2 tablets by mouth every 4 (four) hours as needed for moderate pain.     hydrocortisone 2.5 % rectal cream  Commonly known as:  ANUSOL-HC  Place 1 application rectally 4 (four) times daily.     insulin lispro 100 UNIT/ML KiwkPen  Commonly known as:  HUMALOG KWIKPEN  Inject 0.01-0.1 mLs (1-10 Units total) into the skin 3 (three) times daily. Sliding scale CBG 70 - 120: 0 units CBG 121 - 150: 1 unit,  CBG 151 - 200: 2 units,  CBG 201 - 250: 3 units,  CBG 251 - 300: 5 units,  CBG 301 - 350: 7 units,  CBG 351 - 400: 9 units   CBG > 400: 10 units and notify your MD     multivitamin with minerals tablet  Take 1 tablet by mouth daily.     ondansetron 4 MG tablet  Commonly known as:  ZOFRAN  Take 1 tablet (4 mg total) by mouth every 8 (eight) hours as needed for nausea or vomiting.     OTEZLA 30 MG Tabs  Generic drug:  Apremilast  Take 30 mg by mouth daily.     promethazine 25 MG tablet  Commonly known as:  PHENERGAN  Take 1 tablet (25 mg total) by mouth every 4 (four) hours as needed for nausea or vomiting.     saccharomyces boulardii 250 MG capsule  Commonly known as:  FLORASTOR  Take 1 capsule (250 mg total) by mouth 2 (two) times daily.     sodium bicarbonate 650 MG tablet  Take 1 tablet (650 mg total) by mouth 2 (two) times daily.     TOUJEO SOLOSTAR 300 UNIT/ML Sopn  Generic drug:  Insulin Glargine  Inject 40 Units into  the skin every morning.     triamcinolone cream 0.1 %  Commonly known as:  KENALOG  Apply 1 application topically 2 (two) times daily.     warfarin 1 MG tablet  Commonly known as:  COUMADIN  Take 1.5 tablets (1.5 mg total) by  mouth every evening.           Follow-up Information    Follow up with FULP, CAMMIE, MD. Schedule an appointment as soon as possible for a visit in 1 week.   Specialty:  Family Medicine   Why:  Follow up appt after recent hospitalization   Contact information:   [address] [place] [address] [phone]       Follow up with FULP, CAMMIE, MD. Schedule an appointment as soon as possible for a visit in 1 week.   Specialty:  Family Medicine   Why:  Follow up appt after recent hospitalization   Contact information:   [address] [place] [address] [phone]        The results of significant diagnostics from this hospitalization (including imaging, microbiology, ancillary and laboratory) are listed below for reference.    Significant Diagnostic Studies: Ct Abdomen Pelvis Wo Contrast  03/09/2015   CLINICAL DATA:  Patient recently admitted to the hospital for abdominal pain and nausea/vomiting. Returning abdominal pain and cramping.  EXAM: CT ABDOMEN AND PELVIS WITHOUT CONTRAST  TECHNIQUE: Multidetector CT imaging of the abdomen and pelvis was performed following the standard protocol without IV contrast.  COMPARISON:  CT abdomen pelvis 01/20/2015  FINDINGS: Lower chest: Coronary arterial vascular calcifications. Normal heart size. Scarring and or atelectasis within right greater than left lower lobes. No pleural effusion.  Hepatobiliary: Stable irregular area of low attenuation and calcification within the posterior right hepatic lobe, likely sequelae of remote trauma. No new hepatic lesions identified. Post cholecystectomy changes.  Pancreas: Unremarkable  Spleen: Splenosis.  Surgically absent spleen.  Adrenals/Urinary Tract: Normal adrenal glands. Kidneys are symmetric in size. No nephroureterolithiasis. No hydronephrosis. Urinary bladder is unremarkable.  Stomach/Bowel: Oral contrast material to the rectum. Few scattered colonic diverticuli. No evidence for acute  diverticulitis. No evidence for bowel obstruction. No free fluid or free intraperitoneal air.  Vascular/Lymphatic: Normal caliber abdominal aorta. No retroperitoneal adenopathy.  Other: None  Musculoskeletal: No aggressive or acute appearing osseous lesions.  IMPRESSION: No acute process within the abdomen or pelvis.  Status post splenectomy with multiple splenules.  Post cholecystectomy.   Electronically Signed   By: Lovey Newcomer M.D.   On: 03/09/2015 20:21   Mr [name] Abdomen Wo Contrast  03/12/2015   CLINICAL DATA:  Frequent episodes of abdominal pain, nausea, vomiting loose stools. History of stage 4 chronic kidney disease prohibiting use of iodinated contrast and gadolinium with GFR of 12 mL/minute.  EXAM: MRA ABDOMEN WITHOUT CONTRAST  TECHNIQUE: Angiographic images of the chest were obtained using MRA technique without intravenous contrast.  CONTRAST:  None  COMPARISON:  Unenhanced CT of the abdomen and pelvis on 03/09/2015  FINDINGS: Good quality MRA imaging was able to be obtained utilizing flow sensitive sequences. The abdominal aorta is of normal caliber. There is no evidence of significant atherosclerosis or aneurysmal disease of the aorta. Proximal mesenteric arteries are well visualized. The celiac trunk, proximal SMA and proximal IMA show normal patency. Branch vessel evaluation is limited by unenhanced techniques. 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 The common hepatic artery is widely patent.  Proximal segments of the  renal arteries are normally patent bilaterally. The preliminary unenhanced imaging shows no obvious abnormalities involving bowel loops. No abnormal fluid collections are seen.  IMPRESSION: Adequate unenhanced MRA of the abdomen demonstrates no evidence of mesenteric arterial occlusive disease. Proximal mesenteric arteries are normally patent. 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 Bilateral proximal renal artery segments are  normally patent.   Electronically Signed   By: Aletta Edouard M.D.   On: 03/12/2015 09:03   Dg Chest Port 1 View  03/10/2015   CLINICAL DATA:  Central line placement today.  EXAM: PORTABLE CHEST - 1 VIEW  COMPARISON:  PA and lateral chest 02/24/2015.  FINDINGS: Right IJ central venous catheter is now in place with the tip projecting just above the superior cavoatrial junction. No pneumothorax is identified. The lungs appear clear. Heart size is normal.  IMPRESSION: Right IJ catheter tip projects just above the superior cavoatrial junction. Negative for pneumothorax.   Electronically Signed   By: Inge Rise M.D.   On: 03/10/2015 13:53    Microbiology: Recent Results (from the past 240 hour(s))  Urine culture     Status: None   Collection Time: 04/02/15  3:30 PM  Result Value Ref Range Status   Specimen Description URINE, CLEAN CATCH  Final   Special Requests NONE  Final   Culture   Final    MULTIPLE SPECIES PRESENT, SUGGEST RECOLLECTION Performed at [facility]    Report Status 04/04/2015 FINAL  Final     Labs: Basic Metabolic Panel:  Recent Labs Lab 04/01/15 1203 04/02/15 1324 04/03/15 0516 04/05/15 1008 04/06/15 0542  NA 135 131* 136 136 136  K 3.7 4.2 3.1* 4.3 4.0  CL 101 97* 105 109 108  CO2 23 18* 21* 21* 22  GLUCOSE 280* 191* 137* 135* 108*  BUN 37* 45* 38* 36* 40*  CREATININE 3.61* 4.88* 3.79* 2.45* 2.50*  CALCIUM 10.5* 9.8 9.2 9.0 9.2   Liver Function Tests:  Recent Labs Lab 04/01/15 1203 04/02/15 1324  AST 26 100*  ALT 15 41  ALKPHOS 159* 172*  BILITOT 0.6 1.0  PROT 7.1 7.1  ALBUMIN 2.8* 3.3*    Recent Labs Lab 04/01/15 1203 04/02/15 1324  LIPASE 21* 21*   No results for input(s): AMMONIA in the last 168 hours. CBC:  Recent Labs Lab 04/01/15 1203 04/02/15 1324 04/03/15 0516 04/05/15 1008  WBC 11.3* 13.1* 11.8* 8.3  NEUTROABS  --  7.8*  --   --   HGB 15.2* 15.4* 13.8 12.4  HCT 45.4 46.2* 42.4 39.6  MCV 87.8 88.3 89.6 92.1   PLT 671* 595* 587* 477*   Cardiac Enzymes: No results for input(s): CKTOTAL, CKMB, CKMBINDEX, TROPONINI in the last 168 hours. BNP: BNP (last 3 results)  Recent Labs  01/21/15 1830 02/02/15 0517 02/24/15 1612  BNP 1056.8* 2924.8* 475.5*    ProBNP (last 3 results)  Recent Labs  04/26/14 2358 06/24/14 0945  PROBNP 3725.0* 324.0*    CBG:  Recent Labs Lab 04/05/15 0750 04/05/15 1219 04/05/15 1737 04/05/15 2156 04/06/15 0832  GLUCAP 95 108* 107* 141* 102*

## 2015-04-06 NOTE — Progress Notes (Signed)
Contacted [facility] for resumption of care. Will clarify with rep if F2F needed. Left message for return call. Notified attending for roc orders. Jonnie Finner RN CCM Case Mgmt phone [phone]

## 2015-04-06 NOTE — Discharge Planning (Signed)
Reviewed d/c instructions with pt including follow-up appointment, medications, and precautions.  Pt verbalized good understanding of all instructions including bleeding precautions for Coumadin therapy.  Pt to receive follow-up care from [place].  Pt being d/c into care of family who live nearby.

## 2015-04-10 ENCOUNTER — Ambulatory Visit (INDEPENDENT_AMBULATORY_CARE_PROVIDER_SITE_OTHER): Payer: Medicare Other | Admitting: Pharmacist

## 2015-04-10 DIAGNOSIS — I2109 ST elevation (STEMI) myocardial infarction involving other coronary artery of anterior wall: Secondary | ICD-10-CM

## 2015-04-10 DIAGNOSIS — I513 Intracardiac thrombosis, not elsewhere classified: Secondary | ICD-10-CM

## 2015-04-10 DIAGNOSIS — I214 Non-ST elevation (NSTEMI) myocardial infarction: Secondary | ICD-10-CM

## 2015-04-10 DIAGNOSIS — Z5181 Encounter for therapeutic drug level monitoring: Secondary | ICD-10-CM

## 2015-04-10 LAB — POCT INR: INR: 1.7

## 2015-04-17 ENCOUNTER — Ambulatory Visit (INDEPENDENT_AMBULATORY_CARE_PROVIDER_SITE_OTHER): Payer: Medicare Other | Admitting: Cardiovascular Disease

## 2015-04-17 DIAGNOSIS — I214 Non-ST elevation (NSTEMI) myocardial infarction: Secondary | ICD-10-CM

## 2015-04-17 DIAGNOSIS — I2109 ST elevation (STEMI) myocardial infarction involving other coronary artery of anterior wall: Secondary | ICD-10-CM

## 2015-04-17 DIAGNOSIS — Z5181 Encounter for therapeutic drug level monitoring: Secondary | ICD-10-CM

## 2015-04-17 DIAGNOSIS — I513 Intracardiac thrombosis, not elsewhere classified: Secondary | ICD-10-CM

## 2015-04-17 LAB — POCT INR: INR: 2.6

## 2015-04-24 ENCOUNTER — Emergency Department (HOSPITAL_COMMUNITY)

## 2015-04-24 ENCOUNTER — Inpatient Hospital Stay (HOSPITAL_COMMUNITY)
Admission: EM | Admit: 2015-04-24 | Discharge: 2015-04-29 | DRG: 071 | Disposition: A | Discharge status: Skilled Nursing Facility | Attending: Internal Medicine | Admitting: Internal Medicine

## 2015-04-24 ENCOUNTER — Encounter (HOSPITAL_COMMUNITY): Payer: Self-pay | Admitting: Emergency Medicine

## 2015-04-24 ENCOUNTER — Other Ambulatory Visit: Payer: Self-pay

## 2015-04-24 ENCOUNTER — Ambulatory Visit (HOSPITAL_BASED_OUTPATIENT_CLINIC_OR_DEPARTMENT_OTHER)
Admission: RE | Admit: 2015-04-24 | Discharge: 2015-04-24 | Disposition: A | Discharge status: Home / Self Care | Source: Ambulatory Visit | Attending: Internal Medicine | Admitting: Internal Medicine

## 2015-04-24 VITALS — BP 126/80 | HR 75 | Wt 160.0 lb

## 2015-04-24 DIAGNOSIS — D125 Benign neoplasm of sigmoid colon: Secondary | ICD-10-CM | POA: Diagnosis present

## 2015-04-24 DIAGNOSIS — K5790 Diverticulosis of intestine, part unspecified, without perforation or abscess without bleeding: Secondary | ICD-10-CM | POA: Diagnosis present

## 2015-04-24 DIAGNOSIS — I129 Hypertensive chronic kidney disease with stage 1 through stage 4 chronic kidney disease, or unspecified chronic kidney disease: Secondary | ICD-10-CM | POA: Diagnosis present

## 2015-04-24 DIAGNOSIS — Z79899 Other long term (current) drug therapy: Secondary | ICD-10-CM

## 2015-04-24 DIAGNOSIS — Z833 Family history of diabetes mellitus: Secondary | ICD-10-CM | POA: Diagnosis not present

## 2015-04-24 DIAGNOSIS — E440 Moderate protein-calorie malnutrition: Secondary | ICD-10-CM | POA: Diagnosis present

## 2015-04-24 DIAGNOSIS — R4701 Aphasia: Secondary | ICD-10-CM | POA: Diagnosis present

## 2015-04-24 DIAGNOSIS — I5022 Chronic systolic (congestive) heart failure: Secondary | ICD-10-CM | POA: Diagnosis present

## 2015-04-24 DIAGNOSIS — M545 Low back pain: Secondary | ICD-10-CM | POA: Diagnosis present

## 2015-04-24 DIAGNOSIS — Z881 Allergy status to other antibiotic agents status: Secondary | ICD-10-CM | POA: Diagnosis not present

## 2015-04-24 DIAGNOSIS — Z9081 Acquired absence of spleen: Secondary | ICD-10-CM | POA: Diagnosis present

## 2015-04-24 DIAGNOSIS — G9340 Encephalopathy, unspecified: Principal | ICD-10-CM | POA: Diagnosis present

## 2015-04-24 DIAGNOSIS — Z9049 Acquired absence of other specified parts of digestive tract: Secondary | ICD-10-CM | POA: Diagnosis present

## 2015-04-24 DIAGNOSIS — L409 Psoriasis, unspecified: Secondary | ICD-10-CM | POA: Diagnosis present

## 2015-04-24 DIAGNOSIS — Z7901 Long term (current) use of anticoagulants: Secondary | ICD-10-CM | POA: Diagnosis not present

## 2015-04-24 DIAGNOSIS — K589 Irritable bowel syndrome without diarrhea: Secondary | ICD-10-CM | POA: Diagnosis present

## 2015-04-24 DIAGNOSIS — M4806 Spinal stenosis, lumbar region: Secondary | ICD-10-CM | POA: Diagnosis present

## 2015-04-24 DIAGNOSIS — Z8249 Family history of ischemic heart disease and other diseases of the circulatory system: Secondary | ICD-10-CM

## 2015-04-24 DIAGNOSIS — I2510 Atherosclerotic heart disease of native coronary artery without angina pectoris: Secondary | ICD-10-CM | POA: Diagnosis present

## 2015-04-24 DIAGNOSIS — Z66 Do not resuscitate: Secondary | ICD-10-CM | POA: Diagnosis present

## 2015-04-24 DIAGNOSIS — E873 Alkalosis: Secondary | ICD-10-CM | POA: Diagnosis present

## 2015-04-24 DIAGNOSIS — I5032 Chronic diastolic (congestive) heart failure: Secondary | ICD-10-CM

## 2015-04-24 DIAGNOSIS — M1990 Unspecified osteoarthritis, unspecified site: Secondary | ICD-10-CM | POA: Diagnosis present

## 2015-04-24 DIAGNOSIS — N179 Acute kidney failure, unspecified: Secondary | ICD-10-CM | POA: Diagnosis present

## 2015-04-24 DIAGNOSIS — Z96653 Presence of artificial knee joint, bilateral: Secondary | ICD-10-CM | POA: Diagnosis present

## 2015-04-24 DIAGNOSIS — R197 Diarrhea, unspecified: Secondary | ICD-10-CM | POA: Diagnosis not present

## 2015-04-24 DIAGNOSIS — E1122 Type 2 diabetes mellitus with diabetic chronic kidney disease: Secondary | ICD-10-CM | POA: Diagnosis present

## 2015-04-24 DIAGNOSIS — R109 Unspecified abdominal pain: Secondary | ICD-10-CM | POA: Diagnosis present

## 2015-04-24 DIAGNOSIS — H269 Unspecified cataract: Secondary | ICD-10-CM | POA: Diagnosis present

## 2015-04-24 DIAGNOSIS — E872 Acidosis, unspecified: Secondary | ICD-10-CM | POA: Diagnosis present

## 2015-04-24 DIAGNOSIS — Z888 Allergy status to other drugs, medicaments and biological substances status: Secondary | ICD-10-CM

## 2015-04-24 DIAGNOSIS — D472 Monoclonal gammopathy: Secondary | ICD-10-CM | POA: Diagnosis present

## 2015-04-24 DIAGNOSIS — E785 Hyperlipidemia, unspecified: Secondary | ICD-10-CM | POA: Diagnosis present

## 2015-04-24 DIAGNOSIS — G4733 Obstructive sleep apnea (adult) (pediatric): Secondary | ICD-10-CM | POA: Diagnosis present

## 2015-04-24 DIAGNOSIS — K921 Melena: Secondary | ICD-10-CM | POA: Diagnosis present

## 2015-04-24 DIAGNOSIS — Z9981 Dependence on supplemental oxygen: Secondary | ICD-10-CM | POA: Diagnosis not present

## 2015-04-24 DIAGNOSIS — Z885 Allergy status to narcotic agent status: Secondary | ICD-10-CM | POA: Diagnosis not present

## 2015-04-24 DIAGNOSIS — E669 Obesity, unspecified: Secondary | ICD-10-CM | POA: Diagnosis present

## 2015-04-24 DIAGNOSIS — Z90710 Acquired absence of both cervix and uterus: Secondary | ICD-10-CM

## 2015-04-24 DIAGNOSIS — I4510 Unspecified right bundle-branch block: Secondary | ICD-10-CM | POA: Diagnosis present

## 2015-04-24 DIAGNOSIS — Z91040 Latex allergy status: Secondary | ICD-10-CM | POA: Diagnosis not present

## 2015-04-24 DIAGNOSIS — Z91048 Other nonmedicinal substance allergy status: Secondary | ICD-10-CM

## 2015-04-24 DIAGNOSIS — F419 Anxiety disorder, unspecified: Secondary | ICD-10-CM | POA: Diagnosis present

## 2015-04-24 DIAGNOSIS — R471 Dysarthria and anarthria: Secondary | ICD-10-CM | POA: Diagnosis present

## 2015-04-24 DIAGNOSIS — N184 Chronic kidney disease, stage 4 (severe): Secondary | ICD-10-CM | POA: Diagnosis present

## 2015-04-24 DIAGNOSIS — Z794 Long term (current) use of insulin: Secondary | ICD-10-CM | POA: Diagnosis not present

## 2015-04-24 DIAGNOSIS — K219 Gastro-esophageal reflux disease without esophagitis: Secondary | ICD-10-CM | POA: Diagnosis present

## 2015-04-24 DIAGNOSIS — R4182 Altered mental status, unspecified: Secondary | ICD-10-CM | POA: Diagnosis not present

## 2015-04-24 DIAGNOSIS — E118 Type 2 diabetes mellitus with unspecified complications: Secondary | ICD-10-CM | POA: Insufficient documentation

## 2015-04-24 DIAGNOSIS — I255 Ischemic cardiomyopathy: Secondary | ICD-10-CM | POA: Diagnosis present

## 2015-04-24 DIAGNOSIS — G8929 Other chronic pain: Secondary | ICD-10-CM | POA: Diagnosis present

## 2015-04-24 DIAGNOSIS — I272 Other secondary pulmonary hypertension: Secondary | ICD-10-CM | POA: Diagnosis present

## 2015-04-24 DIAGNOSIS — M109 Gout, unspecified: Secondary | ICD-10-CM | POA: Diagnosis present

## 2015-04-24 DIAGNOSIS — I951 Orthostatic hypotension: Secondary | ICD-10-CM | POA: Diagnosis present

## 2015-04-24 DIAGNOSIS — I513 Intracardiac thrombosis, not elsewhere classified: Secondary | ICD-10-CM | POA: Diagnosis present

## 2015-04-24 DIAGNOSIS — J449 Chronic obstructive pulmonary disease, unspecified: Secondary | ICD-10-CM | POA: Diagnosis present

## 2015-04-24 DIAGNOSIS — D860 Sarcoidosis of lung: Secondary | ICD-10-CM | POA: Diagnosis present

## 2015-04-24 DIAGNOSIS — Z6828 Body mass index (BMI) 28.0-28.9, adult: Secondary | ICD-10-CM

## 2015-04-24 LAB — I-STAT VENOUS BLOOD GAS, ED
Acid-Base Excess: 3 mmol/L — ABNORMAL HIGH (ref 0.0–2.0)
Bicarbonate: 24.1 mEq/L — ABNORMAL HIGH (ref 20.0–24.0)
O2 Saturation: 50 %
PH VEN: 7.531 — AB (ref 7.250–7.300)
TCO2: 25 mmol/L (ref 0–100)
pCO2, Ven: 28.8 mmHg — ABNORMAL LOW (ref 45.0–50.0)
pO2, Ven: 23 mmHg — CL (ref 30.0–45.0)

## 2015-04-24 LAB — CBC
HEMATOCRIT: 44.2 % (ref 36.0–46.0)
HEMOGLOBIN: 14.9 g/dL (ref 12.0–15.0)
MCH: 30.6 pg (ref 26.0–34.0)
MCHC: 33.7 g/dL (ref 30.0–36.0)
MCV: 90.8 fL (ref 78.0–100.0)
Platelets: 576 10*3/uL — ABNORMAL HIGH (ref 150–400)
RBC: 4.87 MIL/uL (ref 3.87–5.11)
RDW: 15 % (ref 11.5–15.5)
WBC: 11.2 10*3/uL — ABNORMAL HIGH (ref 4.0–10.5)

## 2015-04-24 LAB — GLUCOSE, CAPILLARY
GLUCOSE-CAPILLARY: 158 mg/dL — AB (ref 65–99)
GLUCOSE-CAPILLARY: 197 mg/dL — AB (ref 65–99)
GLUCOSE-CAPILLARY: 225 mg/dL — AB (ref 65–99)

## 2015-04-24 LAB — I-STAT CHEM 8, ED
BUN: 42 mg/dL — AB (ref 6–20)
CALCIUM ION: 1.58 mmol/L — AB (ref 1.13–1.30)
CHLORIDE: 98 mmol/L — AB (ref 101–111)
Creatinine, Ser: 4.2 mg/dL — ABNORMAL HIGH (ref 0.44–1.00)
GLUCOSE: 210 mg/dL — AB (ref 65–99)
HCT: 48 % — ABNORMAL HIGH (ref 36.0–46.0)
Hemoglobin: 16.3 g/dL — ABNORMAL HIGH (ref 12.0–15.0)
Potassium: 4 mmol/L (ref 3.5–5.1)
Sodium: 133 mmol/L — ABNORMAL LOW (ref 135–145)
TCO2: 25 mmol/L (ref 0–100)

## 2015-04-24 LAB — I-STAT TROPONIN, ED: Troponin i, poc: 0.05 ng/mL (ref 0.00–0.08)

## 2015-04-24 LAB — URINE MICROSCOPIC-ADD ON

## 2015-04-24 LAB — COMPREHENSIVE METABOLIC PANEL
ALK PHOS: 148 U/L — AB (ref 38–126)
ALT: 16 U/L (ref 14–54)
AST: 30 U/L (ref 15–41)
Albumin: 3 g/dL — ABNORMAL LOW (ref 3.5–5.0)
Anion gap: 13 (ref 5–15)
BUN: 41 mg/dL — ABNORMAL HIGH (ref 6–20)
CALCIUM: 13.1 mg/dL — AB (ref 8.9–10.3)
CO2: 25 mmol/L (ref 22–32)
CREATININE: 4.07 mg/dL — AB (ref 0.44–1.00)
Chloride: 97 mmol/L — ABNORMAL LOW (ref 101–111)
GFR calc non Af Amer: 10 mL/min — ABNORMAL LOW (ref >60)
GFR, EST AFRICAN AMERICAN: 12 mL/min — AB (ref >60)
GLUCOSE: 213 mg/dL — AB (ref 65–99)
Potassium: 4.1 mmol/L (ref 3.5–5.1)
SODIUM: 135 mmol/L (ref 135–145)
Total Bilirubin: 0.5 mg/dL (ref 0.3–1.2)
Total Protein: 6.3 g/dL — ABNORMAL LOW (ref 6.5–8.1)

## 2015-04-24 LAB — DIFFERENTIAL
Basophils Absolute: 0 10*3/uL (ref 0.0–0.1)
Basophils Relative: 0 %
EOS PCT: 1 %
Eosinophils Absolute: 0.1 10*3/uL (ref 0.0–0.7)
LYMPHS PCT: 49 %
Lymphs Abs: 5.5 10*3/uL — ABNORMAL HIGH (ref 0.7–4.0)
MONOS PCT: 11 %
Monocytes Absolute: 1.2 10*3/uL — ABNORMAL HIGH (ref 0.1–1.0)
NEUTROS ABS: 4.4 10*3/uL (ref 1.7–7.7)
NEUTROS PCT: 39 %

## 2015-04-24 LAB — TSH: TSH: 1.798 u[IU]/mL (ref 0.350–4.500)

## 2015-04-24 LAB — URINALYSIS, ROUTINE W REFLEX MICROSCOPIC
Bilirubin Urine: NEGATIVE
Glucose, UA: 100 mg/dL — AB
Ketones, ur: NEGATIVE mg/dL
LEUKOCYTES UA: NEGATIVE
Nitrite: NEGATIVE
Specific Gravity, Urine: 1.011 (ref 1.005–1.030)
UROBILINOGEN UA: 0.2 mg/dL (ref 0.0–1.0)
pH: 7.5 (ref 5.0–8.0)

## 2015-04-24 LAB — PROTIME-INR
INR: 2.45 — AB (ref 0.00–1.49)
Prothrombin Time: 26.3 seconds — ABNORMAL HIGH (ref 11.6–15.2)

## 2015-04-24 LAB — PROCALCITONIN: Procalcitonin: 0.57 ng/mL

## 2015-04-24 LAB — I-STAT CG4 LACTIC ACID, ED
LACTIC ACID, VENOUS: 3.7 mmol/L — AB (ref 0.5–2.0)
Lactic Acid, Venous: 3.64 mmol/L (ref 0.5–2.0)

## 2015-04-24 LAB — AMMONIA: Ammonia: 33 umol/L (ref 9–35)

## 2015-04-24 LAB — APTT: aPTT: 36 seconds (ref 24–37)

## 2015-04-24 MED ORDER — INSULIN GLARGINE 100 UNIT/ML ~~LOC~~ SOLN
20.0000 [IU] | Freq: Every day | SUBCUTANEOUS | Status: DC
  Administered 2015-04-25 – 2015-04-29 (×5): 20 [IU] via SUBCUTANEOUS
  Filled 2015-04-24 (×5): qty 0.2

## 2015-04-24 MED ORDER — FUROSEMIDE 80 MG PO TABS
80.0000 mg | ORAL_TABLET | Freq: Two times a day (BID) | ORAL | Status: DC
  Administered 2015-04-24 – 2015-04-27 (×7): 80 mg via ORAL
  Filled 2015-04-24 (×7): qty 1

## 2015-04-24 MED ORDER — ALUM & MAG HYDROXIDE-SIMETH 200-200-20 MG/5ML PO SUSP: 30.0000 mL | Freq: Four times a day (QID) | ORAL | Status: DC | PRN

## 2015-04-24 MED ORDER — PROMETHAZINE HCL 25 MG PO TABS: 25.0000 mg | ORAL_TABLET | ORAL | Status: DC | PRN

## 2015-04-24 MED ORDER — WARFARIN SODIUM 3 MG PO TABS
1.5000 mg | ORAL_TABLET | Freq: Every evening | ORAL | Status: DC
  Administered 2015-04-24 – 2015-04-27 (×4): 1.5 mg via ORAL
  Filled 2015-04-24 (×5): qty 0.5

## 2015-04-24 MED ORDER — MOMETASONE FURO-FORMOTEROL FUM 100-5 MCG/ACT IN AERO
2.0000 | INHALATION_SPRAY | Freq: Two times a day (BID) | RESPIRATORY_TRACT | Status: DC
  Administered 2015-04-24 – 2015-04-28 (×8): 2 via RESPIRATORY_TRACT
  Filled 2015-04-24 (×3): qty 8.8

## 2015-04-24 MED ORDER — VANCOMYCIN HCL 10 G IV SOLR
1500.0000 mg | INTRAVENOUS | Status: AC
  Administered 2015-04-24: 1500 mg via INTRAVENOUS
  Filled 2015-04-24: qty 1500

## 2015-04-24 MED ORDER — APREMILAST 30 MG PO TABS: 30.0000 mg | ORAL_TABLET | Freq: Every day | ORAL | Status: DC

## 2015-04-24 MED ORDER — ACETAMINOPHEN 650 MG RE SUPP
650.0000 mg | Freq: Four times a day (QID) | RECTAL | Status: DC | PRN
Start: 2015-04-24 — End: 2015-04-29

## 2015-04-24 MED ORDER — HYDROCODONE-ACETAMINOPHEN 5-325 MG PO TABS
1.0000 | ORAL_TABLET | ORAL | Status: DC | PRN
  Administered 2015-04-25 – 2015-04-27 (×3): 1 via ORAL
  Filled 2015-04-24 (×3): qty 1

## 2015-04-24 MED ORDER — WARFARIN - PHYSICIAN DOSING INPATIENT: Freq: Every day | Status: DC

## 2015-04-24 MED ORDER — ACETAMINOPHEN 325 MG PO TABS: 650.0000 mg | ORAL_TABLET | Freq: Four times a day (QID) | ORAL | Status: DC | PRN

## 2015-04-24 MED ORDER — ENOXAPARIN SODIUM 30 MG/0.3ML ~~LOC~~ SOLN
30.0000 mg | SUBCUTANEOUS | Status: DC
Start: 2015-04-24 — End: 2015-04-24

## 2015-04-24 MED ORDER — INSULIN GLARGINE 300 UNIT/ML ~~LOC~~ SOPN: 20.0000 [IU] | PEN_INJECTOR | Freq: Every morning | SUBCUTANEOUS | Status: DC

## 2015-04-24 MED ORDER — ONDANSETRON HCL 4 MG/2ML IJ SOLN: 4.0000 mg | Freq: Four times a day (QID) | INTRAMUSCULAR | Status: DC | PRN

## 2015-04-24 MED ORDER — ALBUTEROL SULFATE (2.5 MG/3ML) 0.083% IN NEBU: 2.5000 mg | INHALATION_SOLUTION | RESPIRATORY_TRACT | Status: DC | PRN

## 2015-04-24 MED ORDER — SACCHAROMYCES BOULARDII 250 MG PO CAPS
250.0000 mg | ORAL_CAPSULE | Freq: Two times a day (BID) | ORAL | Status: DC
  Administered 2015-04-24 – 2015-04-29 (×10): 250 mg via ORAL
  Filled 2015-04-24 (×10): qty 1

## 2015-04-24 MED ORDER — SODIUM CHLORIDE 0.9 % IV BOLUS (SEPSIS)
1000.0000 mL | Freq: Once | INTRAVENOUS | Status: AC
  Administered 2015-04-24: 1000 mL via INTRAVENOUS

## 2015-04-24 MED ORDER — ONDANSETRON HCL 4 MG PO TABS
4.0000 mg | ORAL_TABLET | Freq: Four times a day (QID) | ORAL | Status: DC | PRN
  Administered 2015-04-29: 4 mg via ORAL
  Filled 2015-04-24: qty 1

## 2015-04-24 MED ORDER — ATORVASTATIN CALCIUM 20 MG PO TABS
20.0000 mg | ORAL_TABLET | Freq: Every day | ORAL | Status: DC
  Administered 2015-04-24 – 2015-04-28 (×5): 20 mg via ORAL
  Filled 2015-04-24 (×5): qty 1

## 2015-04-24 MED ORDER — PANTOPRAZOLE SODIUM 40 MG PO TBEC
80.0000 mg | DELAYED_RELEASE_TABLET | Freq: Every day | ORAL | Status: DC
  Administered 2015-04-25 – 2015-04-29 (×5): 80 mg via ORAL
  Filled 2015-04-24 (×5): qty 2

## 2015-04-24 MED ORDER — PIPERACILLIN-TAZOBACTAM IN DEX 2-0.25 GM/50ML IV SOLN
2.2500 g | Freq: Four times a day (QID) | INTRAVENOUS | Status: DC
  Administered 2015-04-24 – 2015-04-26 (×6): 2.25 g via INTRAVENOUS
  Filled 2015-04-24 (×11): qty 50

## 2015-04-24 MED ORDER — VANCOMYCIN HCL IN DEXTROSE 1-5 GM/200ML-% IV SOLN
1000.0000 mg | INTRAVENOUS | Status: DC
Start: 2015-04-26 — End: 2015-04-26
  Filled 2015-04-24: qty 200

## 2015-04-24 MED ORDER — PIPERACILLIN-TAZOBACTAM 3.375 G IVPB 30 MIN
3.3750 g | Freq: Once | INTRAVENOUS | Status: AC
  Administered 2015-04-24: 3.375 g via INTRAVENOUS
  Filled 2015-04-24: qty 50

## 2015-04-24 MED ORDER — FLUTICASONE PROPIONATE 50 MCG/ACT NA SUSP
2.0000 | Freq: Every day | NASAL | Status: DC | PRN
  Filled 2015-04-24: qty 16

## 2015-04-24 MED ORDER — INSULIN ASPART 100 UNIT/ML ~~LOC~~ SOLN
0.0000 [IU] | Freq: Four times a day (QID) | SUBCUTANEOUS | Status: DC
  Administered 2015-04-24: 3 [IU] via SUBCUTANEOUS
  Administered 2015-04-25: 2 [IU] via SUBCUTANEOUS
  Administered 2015-04-25 – 2015-04-26 (×3): 3 [IU] via SUBCUTANEOUS
  Administered 2015-04-27: 2 [IU] via SUBCUTANEOUS
  Administered 2015-04-27: 5 [IU] via SUBCUTANEOUS
  Administered 2015-04-28: 2 [IU] via SUBCUTANEOUS
  Administered 2015-04-28: 3 [IU] via SUBCUTANEOUS

## 2015-04-24 MED ORDER — CARVEDILOL 3.125 MG PO TABS
3.1250 mg | ORAL_TABLET | Freq: Two times a day (BID) | ORAL | Status: DC
  Administered 2015-04-25 – 2015-04-26 (×3): 3.125 mg via ORAL
  Filled 2015-04-24 (×3): qty 1

## 2015-04-24 MED ORDER — DICYCLOMINE HCL 10 MG PO CAPS
10.0000 mg | ORAL_CAPSULE | Freq: Three times a day (TID) | ORAL | Status: DC
  Administered 2015-04-24 – 2015-04-29 (×15): 10 mg via ORAL
  Filled 2015-04-24 (×16): qty 1

## 2015-04-24 MED ORDER — FAMOTIDINE 20 MG PO TABS
20.0000 mg | ORAL_TABLET | Freq: Every day | ORAL | Status: DC
  Administered 2015-04-25 – 2015-04-29 (×5): 20 mg via ORAL
  Filled 2015-04-24 (×5): qty 1

## 2015-04-24 MED ORDER — SODIUM CHLORIDE 0.9 % IV SOLN
INTRAVENOUS | Status: DC
  Administered 2015-04-24 – 2015-04-26 (×6): via INTRAVENOUS

## 2015-04-24 NOTE — Progress Notes (Signed)
   04/24/15 1700  Vital Signs  BP (!) 133/114 mmHg  BP Location Right Arm  Patient Position (if appropriate) Sitting  BP Method Automatic  Pulse Rate 100  Pulse Rate Source Dinamap  Resp (!) 22  Temp 97.6 F (36.4 C)  Temp Source Oral  Oxygen Therapy  SpO2 93 %  O2 Device Room Air  O2 Flow Rate (L/min) 4 L/min   Pt arrived to the unit as admit from ED at 1700; MD notified of pt vitals; pt oriented to the unit and room; brother at bedside. MD called for clarification of pt ordered fluids and he said it's ok to give; pt calm and in bed with call light within reach and family at side. Reported off to oncoming RN. Francis Gaines Kuffour RN.

## 2015-04-24 NOTE — ED Notes (Signed)
PA at bedside.

## 2015-04-24 NOTE — Progress Notes (Signed)
ANTIBIOTIC CONSULT NOTE - INITIAL  Pharmacy Consult for vancomycin + zosyn Indication: rule out sepsis  Allergies  Allergen Reactions  . Adhesive [Tape] Other (See Comments)    Blisters   . Avelox [Moxifloxacin Hcl In Nacl] Other (See Comments)    GI upset  . Codeine Other (See Comments)    "crazy"   . Guaifenesin Nausea And Vomiting    Takes Mucinex at home without issue  . Latex Swelling  . Oxycodone Nausea And Vomiting    Takes Percocet at home without issue    Patient Measurements:   Adjusted Body Weight:   Vital Signs: Temp: 98 F (36.7 C) (09/22 1123) Temp Source: Oral (09/22 1123) BP: 167/82 mmHg (09/22 1330) Pulse Rate: 65 (09/22 1330) Intake/Output from previous day:   Intake/Output from this shift:    Labs:  Recent Labs  04/24/15 1132 04/24/15 1146  WBC 11.2*  --   HGB 14.9 16.3*  PLT 576*  --   CREATININE 4.07* 4.20*   Estimated Creatinine Clearance: 12.4 mL/min (by C-G formula based on Cr of 4.2). No results for input(s): VANCOTROUGH, VANCOPEAK, VANCORANDOM, GENTTROUGH, GENTPEAK, GENTRANDOM, TOBRATROUGH, TOBRAPEAK, TOBRARND, AMIKACINPEAK, AMIKACINTROU, AMIKACIN in the last 72 hours.   Microbiology: Recent Results (from the past 720 hour(s))  Urine culture     Status: None   Collection Time: 04/02/15  3:30 PM  Result Value Ref Range Status   Specimen Description URINE, CLEAN CATCH  Final   Special Requests NONE  Final   Culture   Final    MULTIPLE SPECIES PRESENT, SUGGEST RECOLLECTION Performed at [facility]    Report Status 04/04/2015 FINAL  Final    Medical History: Past Medical History  Diagnosis Date  . Essential hypertension, benign   . Coronary atherosclerosis of native coronary artery 01/04/2006    Tiny OM1 70-90% ostial stenosis, no other CAD  . COPD (chronic obstructive pulmonary disease)   . Esophageal reflux   . Dyslipidemia   . Gout   . Colon polyps 2011    Tubular adenomatous polyps  . Spinal stenosis of  lumbar region     chronic low back pain.   . Bulging lumbar disc   . Chronic diastolic heart failure   . PNA (pneumonia) 2012    With pleural effusion, requiring thoracentesis  . Chronic bronchitis   . On home oxygen therapy   . OSA (obstructive sleep apnea)     Marginally compliant with CPAP  . Type II diabetes mellitus   . Arthritis   . Diverticulosis   . GERD (gastroesophageal reflux disease)   . Anxiety   . Hyperlipidemia   . Sarcoidosis of lung   . CKD (chronic kidney disease) stage 4, GFR 15-29 ml/min   . IBS (irritable bowel syndrome)   . Cataracts, both eyes   . Headache(784.0)   . Lactic acidosis 01/21/2015  . Chronic CHF (congestive heart failure)     Medications:  Anti-infectives    Start     Dose/Rate Route Frequency Ordered Stop   04/26/15 1500  vancomycin (VANCOCIN) IVPB 1000 mg/200 mL premix     1,000 mg 200 mL/hr over 60 Minutes Intravenous Every 48 hours 04/24/15 1412     04/24/15 2030  piperacillin-tazobactam (ZOSYN) IVPB 2.25 g     2.25 g 100 mL/hr over 30 Minutes Intravenous Every 6 hours 04/24/15 1412     04/24/15 1415  piperacillin-tazobactam (ZOSYN) IVPB 3.375 g     3.375 g 100 mL/hr over 30 Minutes  Intravenous  Once 04/24/15 1400     04/24/15 1415  vancomycin (VANCOCIN) 1,500 mg in sodium chloride 0.9 % 500 mL IVPB     1,500 mg 250 mL/hr over 120 Minutes Intravenous STAT 04/24/15 1400 04/25/15 1415     Assessment: 37 yof presented to the ED with apashia and abdominal pain. To start empiric vancomycin + zosyn for possible sepsis. Pt is afebrile and WBC is elevated at 11.2. Scr is elevated at 4.2. Lactic acid is 3.64.   Vanc 9/22>> Zosyn 9/22>>  Goal of Therapy:  Vancomycin trough level 15-20 mcg/ml  Plan:  - Vanc 1500mg  IV x 1 then 1gm IV Q48H - Zosyn 3.375gm IV x 1 then 2.25gm Q6H - F/u renal fxn, C&S, clinical status and trough at [place], Rande Lawman 04/24/2015,2:13 PM

## 2015-04-24 NOTE — ED Notes (Signed)
Brother brought pt to ED from [facility] center with c/o "pt is unable to put words and sentences together. Also has abd pain" pt is slow to answer questions regarding birthday, date.

## 2015-04-24 NOTE — Consult Note (Signed)
Referring Physician: Thomasene Lot    Chief Complaint: Possible Aphasia  HPI:                                                                                                                                         [name] is an 69 y.o. female with left ventricular thrombus on chronic coumadin with therapeutic INR. Patient is presenting to hospital after brother came to pick her up for her cardiology appointment.  He noted she was not talking and staring of into space.  She remained this way throughout the whole drive.  Upon arriving to the office the cardiologist noted her strange behavior and recommended they go to the ED. Per brother, she talked to her sister yesterday at some point and she was talking well and appeared normal. She lives alone so no one knows what medications she took this AM or how she was throughout the night.  Per brother in the past hospitalizations she has had staring spells that lasted for only seconds but this also was in the setting of infection.   Currently patient is awake, hard to get focused on one thing, follows commands but repeating, "Lord Jesus help me".   Date last known well: Date: 04/23/2015 Time last known well: Unable to determine tPA Given: No: No LSN and minimal symptoms   Past Medical History  Diagnosis Date  . Essential hypertension, benign   . Coronary atherosclerosis of native coronary artery 01/04/2006    Tiny OM1 70-90% ostial stenosis, no other CAD  . COPD (chronic obstructive pulmonary disease)   . Esophageal reflux   . Dyslipidemia   . Gout   . Colon polyps 2011    Tubular adenomatous polyps  . Spinal stenosis of lumbar region     chronic low back pain.   . Bulging lumbar disc   . Chronic diastolic heart failure   . PNA (pneumonia) 2012    With pleural effusion, requiring thoracentesis  . Chronic bronchitis   . On home oxygen therapy   . OSA (obstructive sleep apnea)     Marginally compliant with CPAP  . Type II diabetes  mellitus   . Arthritis   . Diverticulosis   . GERD (gastroesophageal reflux disease)   . Anxiety   . Hyperlipidemia   . Sarcoidosis of lung   . CKD (chronic kidney disease) stage 4, GFR 15-29 ml/min   . IBS (irritable bowel syndrome)   . Cataracts, both eyes   . Headache(784.0)   . Lactic acidosis 01/21/2015  . Chronic CHF (congestive heart failure)     Past Surgical History  Procedure Laterality Date  . Vesicovaginal fistula closure w/  total abdominal hysterectomy  1992  . Bilateral total knee replacements Bilateral Rt=5/04 & Lft=1/09    by DrAlusio  . 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 Zella Richer  . Appendectomy  1957  .  Tonsillectomy  1968  . Cholecystectomy  1980's  . Abdominal hysterectomy  1992  . Reduction mammaplasty Bilateral 1980's  . Cardiac catheterization  1990's  . Thoracentesis  2012  . Joint replacement    . Av fistula placement Left 12/31/2013    Procedure: ARTERIOVENOUS (AV) FISTULA CREATION;  Surgeon: Elam Dutch, MD;  Location: [place];  Service: Vascular;  Laterality: Left;  . Colonoscopy w/ biopsies and polypectomy    . Av fistula placement Left 03/18/2014    Procedure: ARTERIOVENOUS (AV) FISTULA CREATION- LEFT BRACHIOCEPHALIC ;  Surgeon: Elam Dutch, MD;  Location: [facility] OR;  Service: Vascular;  Laterality: Left;  . Ligation of arteriovenous  fistula Left 03/18/2014    Procedure: LIGATION OF ARTERIOVENOUS  FISTULA- LEFT RADIOCEPHALIC;  Surgeon: Elam Dutch, MD;  Location: [place];  Service: Vascular;  Laterality: Left;  . Cardiac catheterization N/A 01/27/2015    Procedure: Right Heart Cath;  Surgeon: Larey Dresser, MD;  Location: [facility] [place] [facility];  Service: Cardiovascular;  Laterality: N/A;  . Esophagogastroduodenoscopy (egd) with propofol N/A 03/14/2015    Procedure: ESOPHAGOGASTRODUODENOSCOPY (EGD) WITH PROPOFOL;  Surgeon: Gatha Mayer, MD;  Location: [place];  Service: Endoscopy;  Laterality: N/A;  . Colonoscopy with propofol N/A  03/14/2015    Procedure: COLONOSCOPY WITH PROPOFOL;  Surgeon: Gatha Mayer, MD;  Location: [place];  Service: Endoscopy;  Laterality: N/A;    Family History  Problem Relation Age of Onset  . Diabetes Mother   . Heart disease Mother   . Hyperlipidemia Mother   . Varicose Veins Mother   . Breast cancer Maternal Aunt   . Heart disease Father   . Deep vein thrombosis Father   . Hyperlipidemia Father   . Heart disease Sister     before age 18  . Cancer Sister   . Diabetes Sister   . Hyperlipidemia Sister   . Heart attack Sister   . Heart disease Brother   . Diabetes Brother   . Hyperlipidemia Brother   . Heart attack Brother   . Hyperlipidemia Sister    Social History:  reports that she has never smoked. She has never used smokeless tobacco. She reports that she does not drink alcohol or use illicit drugs.  Allergies:  Allergies  Allergen Reactions  . Adhesive [Tape] Other (See Comments)    Blisters   . Avelox [Moxifloxacin Hcl In Nacl] Other (See Comments)    GI upset  . Codeine Other (See Comments)    "crazy"   . Guaifenesin Nausea And Vomiting    Takes Mucinex at home without issue  . Latex Swelling  . Oxycodone Nausea And Vomiting    Takes Percocet at home without issue    Medications:                                                                                                                           Current Facility-Administered Medications  Medication Dose Route  Frequency Provider Last Rate Last Dose  . piperacillin-tazobactam (ZOSYN) IVPB 2.25 g  2.25 g Intravenous Q6H Rachel L Rumbarger, RPH      . vancomycin (VANCOCIN) 1,500 mg in sodium chloride 0.9 % 500 mL IVPB  1,500 mg Intravenous STAT Valeda Malm Rumbarger, RPH 250 mL/hr at 04/24/15 1459 1,500 mg at 04/24/15 1459  . [START ON 04/26/2015] vancomycin (VANCOCIN) IVPB 1000 mg/200 mL premix  1,000 mg Intravenous Q48H Valeda Malm Rumbarger, [facility]       Current Outpatient Prescriptions  Medication Sig  Dispense Refill  . acetaminophen (TYLENOL) 500 MG tablet Take 1,000 mg by mouth 2 (two) times daily as needed (pain).     Marland Kitchen albuterol (PROVENTIL HFA;VENTOLIN HFA) 108 (90 BASE) MCG/ACT inhaler Inhale 2 puffs into the lungs every 4 (four) hours as needed for wheezing or shortness of breath.     Marland Kitchen albuterol (PROVENTIL) (2.5 MG/3ML) 0.083% nebulizer solution Take 2.5 mg by nebulization every 2 (two) hours as needed for wheezing or shortness of breath.    . Alpha-D-Galactosidase (BEANO PO) Take 1 tablet by mouth daily.    Marland Kitchen Apremilast (OTEZLA) 30 MG TABS Take 30 mg by mouth daily.     Marland Kitchen atorvastatin (LIPITOR) 20 MG tablet Take 20 mg by mouth at bedtime.    . carvedilol (COREG) 3.125 MG tablet Take 1 tablet (3.125 mg total) by mouth 2 (two) times daily with a meal. 60 tablet 0  . dicyclomine (BENTYL) 10 MG capsule Take 1 capsule (10 mg total) by mouth 4 (four) times daily -  before meals and at bedtime. 90 capsule 0  . esomeprazole (NEXIUM) 40 MG capsule Take 1 capsule by mouth 30 min before breakfast. (Patient taking differently: Take 40 mg by mouth daily. ) 30 capsule 6  . famotidine (PEPCID) 20 MG tablet Take 1 tablet (20 mg total) by mouth daily. 30 tablet 3  . feeding supplement (BOOST / RESOURCE BREEZE) LIQD Take 1 Container by mouth 3 (three) times daily between meals. 90 Container 0  . ferrous sulfate 325 (65 FE) MG tablet Take 325 mg by mouth daily with breakfast.    . fluticasone (FLONASE) 50 MCG/ACT nasal spray Place 2 sprays into both nostrils daily as needed for allergies or rhinitis.     . Fluticasone-Salmeterol (ADVAIR) 250-50 MCG/DOSE AEPB Inhale 1 puff into the lungs 2 (two) times daily. 180 each 3  . furosemide (LASIX) 80 MG tablet Take 1 tablet (80 mg total) by mouth daily. Take 80 mg (2 tablets) in am and 40 mg (1 tablet) in pm 30 tablet 0  . HYDROcodone-acetaminophen (NORCO/VICODIN) 5-325 MG per tablet Take 1-2 tablets by mouth every 4 (four) hours as needed for moderate pain. 15  tablet 0  . hydrocortisone (ANUSOL-HC) 2.5 % rectal cream Place 1 application rectally 4 (four) times daily. 30 g 0  . insulin lispro (HUMALOG KWIKPEN) 100 UNIT/ML KiwkPen Inject 0.01-0.1 mLs (1-10 Units total) into the skin 3 (three) times daily. Sliding scale CBG 70 - 120: 0 units CBG 121 - 150: 1 unit,  CBG 151 - 200: 2 units,  CBG 201 - 250: 3 units,  CBG 251 - 300: 5 units,  CBG 301 - 350: 7 units,  CBG 351 - 400: 9 units   CBG > 400: 10 units and notify your MD 15 mL 3  . Multiple Vitamins-Minerals (MULTIVITAMIN WITH MINERALS) tablet Take 1 tablet by mouth daily.    . ondansetron (ZOFRAN) 4 MG tablet Take 1 tablet (4  mg total) by mouth every 8 (eight) hours as needed for nausea or vomiting. 30 tablet 0  . promethazine (PHENERGAN) 25 MG tablet Take 1 tablet (25 mg total) by mouth every 4 (four) hours as needed for nausea or vomiting. 30 tablet 0  . saccharomyces boulardii (FLORASTOR) 250 MG capsule Take 1 capsule (250 mg total) by mouth 2 (two) times daily. 60 capsule 3  . sodium bicarbonate 650 MG tablet Take 1 tablet (650 mg total) by mouth 2 (two) times daily. 60 tablet 3  . TOUJEO SOLOSTAR 300 UNIT/ML SOPN Inject 40 Units into the skin every morning.  3  . triamcinolone cream (KENALOG) 0.1 % Apply 1 application topically 2 (two) times daily.    Marland Kitchen warfarin (COUMADIN) 1 MG tablet Take 1.5 tablets (1.5 mg total) by mouth every evening. 30 tablet 0     ROS:                                                                                                                                       History obtained from unobtainable from patient due to mental status   Neurologic Examination:                                                                                                      Blood pressure 162/73, pulse 63, temperature 98 F (36.7 C), temperature source Oral, resp. rate 18, SpO2 100 %.  HEENT-  Normocephalic, no lesions, without obvious abnormality.  Normal external eye and  conjunctiva.  Normal TM's bilaterally.  Normal auditory canals and external ears. Normal external nose, mucus membranes and septum.  Normal pharynx. Cardiovascular- S1, S2 normal, pulses palpable throughout   Lungs- chest clear, no wheezing, rales, normal symmetric air entry Abdomen- normal findings: bowel sounds normal Extremities- no edema Lymph-no adenopathy palpable Musculoskeletal-no joint tenderness, deformity or swelling Skin-warm and dry, no hyperpigmentation, vitiligo, or suspicious lesions  Neurological Examination Mental Status: Alert, not oriented and very hard to keep focused on one command or thought.   Speech fluent without evidence of aphasia. Able to name objects, repeat, follow commands and express herself.  She is perseverating on "lord jesus help me".  Able to follow simple one step commands without difficulty. Cranial Nerves: II: Discs flat bilaterally; Visual fields grossly normal, pupils equal, round, reactive to light and accommodation III,IV, VI: ptosis not present, extra-ocular motions intact bilaterally V,VII: smile symmetric, facial light touch sensation normal bilaterally VIII: hearing normal bilaterally IX,X: uvula rises symmetrically  XI: bilateral shoulder shrug XII: midline tongue extension Motor: Moving all extremities antigravity Sensory: withdraws from pain in all extremities.  Deep Tendon Reflexes: 2+ and symmetric throughout Plantars: Right: downgoing   Left: downgoing Cerebellar: normal finger-to-nose,  Gait: not tested for safety    Lab Results: Basic Metabolic Panel:  Recent Labs Lab 04/24/15 1132 04/24/15 1146  NA 135 133*  K 4.1 4.0  CL 97* 98*  CO2 25  --   GLUCOSE 213* 210*  BUN 41* 42*  CREATININE 4.07* 4.20*  CALCIUM 13.1*  --     Liver Function Tests:  Recent Labs Lab 04/24/15 1132  AST 30  ALT 16  ALKPHOS 148*  BILITOT 0.5  PROT 6.3*  ALBUMIN 3.0*   No results for input(s): LIPASE, AMYLASE in the last 168  hours. No results for input(s): AMMONIA in the last 168 hours.  CBC:  Recent Labs Lab 04/24/15 1132 04/24/15 1146  WBC 11.2*  --   NEUTROABS 4.4  --   HGB 14.9 16.3*  HCT 44.2 48.0*  MCV 90.8  --   PLT 576*  --     Cardiac Enzymes: No results for input(s): CKTOTAL, CKMB, CKMBINDEX, TROPONINI in the last 168 hours.  Lipid Panel: No results for input(s): CHOL, TRIG, HDL, CHOLHDL, VLDL, LDLCALC in the last 168 hours.  CBG: No results for input(s): GLUCAP in the last 168 hours.  Microbiology: Results for orders placed or performed during the hospital encounter of 04/02/15  Urine culture     Status: None   Collection Time: 04/02/15  3:30 PM  Result Value Ref Range Status   Specimen Description URINE, CLEAN CATCH  Final   Special Requests NONE  Final   Culture   Final    MULTIPLE SPECIES PRESENT, SUGGEST RECOLLECTION Performed at [facility]    Report Status 04/04/2015 FINAL  Final    Coagulation Studies:  Recent Labs  04/24/15 1132  LABPROT 26.3*  INR 2.45*    Imaging: Ct Head Wo Contrast  04/24/2015   CLINICAL DATA:  Altered mental status.  Unable to speak.  Aphasia.  EXAM: CT HEAD WITHOUT CONTRAST  TECHNIQUE: Contiguous axial images were obtained from the base of the skull through the vertex without intravenous contrast.  COMPARISON:  03/31/2012  FINDINGS: The brainstem, cerebellum, cerebral peduncles, thalamus, basal ganglia, basilar cisterns, and ventricular system appear within normal limits. No intracranial hemorrhage, mass lesion, or acute CVA.  Right mastoid effusion. No fluid in the middle ear. Globes extend 2.4 cm anterior to the interzygomatic line. Prominent orbital adipose tissue with exophthalmos.  IMPRESSION: 1. No acute intracranial findings. 2. 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 3. Right mastoid effusion.   Electronically Signed   By: Van Clines M.D.   On: 04/24/2015 12:59   Dg Chest Port 1  View  04/24/2015   CLINICAL DATA:  Shortness of breath.  EXAM: PORTABLE CHEST 1 VIEW  COMPARISON:  March 10, 2015.  FINDINGS: The heart size and mediastinal contours are within normal limits. Both lungs are clear. No pneumothorax or pleural effusion is noted. The visualized skeletal structures are unremarkable.  IMPRESSION: No acute cardiopulmonary abnormality seen.   Electronically Signed   By: Marijo Conception, M.D.   On: 04/24/2015 12:22    Etta Quill PA-C [facility] [phone]  04/24/2015, 2:54 PM   Patient seen and examined.  Clinical course and management discussed.  Necessary edits performed.  I agree with the  above.  Assessment and plan of care developed and discussed below.     Assessment: 69 y.o. female presenting after being found by her brother with an altered mental status.  Patient's neurological examination is consistent with an encephalopathy.  No focality noted.  Multiple metabolic abnormalities noted.  Post-ictal state remains in the differential as well.  Patient on Coumadin and with a therapeutic INR.  Head CT personally reviewed and shows no acute changes.    Stroke Risk Factors - diabetes mellitus, hyperlipidemia and hypertension  Recommendations: 1.  MRI of the brain without contrast once more cooperative.   2.  EEG 3.  Will continue to follow with you 4.  Agree with continued Coumadin 5.  Agree with addressing metabolic issues   Alexis Goodell, MD [facility] [phone]  04/24/2015  4:09 PM

## 2015-04-24 NOTE — Progress Notes (Signed)
ADVANCED HF CLINIC NOTE  Patient ID: [name], female   DOB: 1945/12/29, 69 y.o.   MRN: [id number] PCP: Dr Chapman Fitch Pulmonology: Dr Lenna Gilford CHF: Dr. Aundra Dubin  69 yo with history of OHS/OSA, sarcoidosis, CAD, suspected ischemic cardiomyopathy, LV apical thrombus, and CKD stage IV presents for cardiology followup.  She was admitted in 6/16 with abdominal pain, nausea, vomiting, elevated lactate, and AKI on CKD.  Echo showed EF decreased to 30-35% with LV apical thrombus.  She was started on anticoagulation.  RHC was done while she was in the hospital, showing moderate pulmonary hypertension and RV>LV failure.  Lexiscan cardiolite showed no ischemia and no definite infarction.  She was thought to have infectious colitis versus possible mesenteric cardioembolism.  She was diuresed carefully given rise in creatinine.    She was admitted again on 03/01/15 with nausea, vomiting, and diarrhea.  Thought to have low flow mesenteric ischemia.  She had 2 episodes of syncope not associated with an arrhythmia while on telemetry, probably due to low BP.  Meds cut back.   She was re-admitted on 03/09/15 with recurrent abdominal pain.  MRA abdomen showed no obstructive mesenteric vascular disease.  EGD was unremarkable, and colonoscopy showed polyps.    Presented to clinic today for routine f/u. Her brother picked her up from home today and noticed she was speaking right. Couldn't find her words. No other focal problems. Apparently took some Zofran for nausea earlier. In clinic she is dysarthric with word-finding difficulty. No other focal abnormalities. Denies SOB/CP. CBG 208   Labs (7/16): K 4.6, creatinine 3.55 Labs (03/15/15): K 3.9, creatinine 3.45  PMH: 1. OHS/OSA: On CPAP at night and oxygen during the day.  2. Psoriasis 3. Obesity 4. CKD stage IV 5. HTN 6. H/o sarcoidosis: Hypercalcemia, splenic involvement, s/p splenectomy.  No definite lung involvement on 9/15 chest CT or 7/16 chest CT.  7. Chronic  systolic CHF: Suspect ischemic cardiomyopathy.  Echo (6/16) with EF 30-35%, apical thrombus, PA systolic pressure 62 mmHg.   8. LV apical thrombus 9. CAD: LHC (6/07) with 70-90% stenosis small OM.  See #7 above, suspect ischemi  cardiomyopathy but no repeat cath given CKD.  Lexiscan Cardiolite (6/16) with EF 47%, inferolateral fixed defect may be attenuation, no ischemia.   10. GERD 11. Gout 12. Hyperlipidemia 13. 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 PAH: RHC (6/16) with mean RA 13, PA 66/26 mean 39, mean PCWP 16, CI 2.43, PVR 4.9 WU.  ?OHS/OSA with pulmonary venous hypertension.  Cannot rule out role for sarcoidosis.  16. Chronic abdominal pain:  MRA in 8/16 showed no significant mesenteric vascular obstruction.  EGD 8/16 was unremarkable.  C-scope 8/16 with polyps.  Possible low flow mesenteric ischemia.  SH: Lives with niece, nonsmoker, no ETOH.   FH: CAD  ROS: All systems reviewed and negative except as per HPI.   No current facility-administered medications for this encounter.   No current outpatient prescriptions on file.   Facility-Administered Medications Ordered in Other Encounters  Medication Dose Route Frequency Provider Last Rate Last Dose  . piperacillin-tazobactam (ZOSYN) IVPB 2.25 g  2.25 g Intravenous Q6H Rachel L Rumbarger, RPH      . sodium chloride 0.9 % bolus 1,000 mL  1,000 mL Intravenous Once Truett Mainland, DO      . vancomycin (VANCOCIN) 1,500 mg in sodium chloride 0.9 % 500 mL IVPB  1,500 mg Intravenous STAT Rachel L Rumbarger, RPH 250 mL/hr at 04/24/15 1459 1,500 mg at 04/24/15 1459  . [  START ON 04/26/2015] vancomycin (VANCOCIN) IVPB 1000 mg/200 mL premix  1,000 mg Intravenous Q48H Rachel L Rumbarger, RPH       BP 126/80 mmHg  Pulse 75  Wt 160 lb (72.576 kg)  SpO2 100% General: NAD, obese Neck: Thick, JVP not elevated, no thyromegaly or thyroid nodule.  Lungs: Clear CV: Nondisplaced PMI.  Heart regular S1/S2, no S3/S4, no murmur.  No edema ankle edema.  No carotid bruit.   Normal pedal pulses.  Abdomen: Soft, nontender, no hepatosplenomegaly, no distention.  Skin: Intact without lesions or rashes.  Neurologic: +aphasia. Otherwise non-focal Psych: Normal affect. Extremities: No clubbing or cyanosis.  HEENT: Normal.   Assessment/Plan: 1. Acute aphasia/dysarthria: - Will send to ER for further evaluation and head CT.  2. Chronic systolic CHF: EF 62-69% on echo, suspected ischemic cardiomyopathy.  No ischemia on Cardiolite.  She is not volume overloaded on exam.  NYHA class IIIb symptoms.  - Overall stable. Continue current regimen.  3. CAD: Suspected ischemic cardiomyopathy.  No active ischemia on Cardiolite.  Would not cath with severe CKD.  - She is on warfarin so not on ASA.   4. CKD: Stage IV. Followed by Dr Joelyn Oms. 5. 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  Suspect PAH component may be due to OHS/OSA, but cannot rule out a role for sarcoidosis.  High resolution CT showed no marked lung abnormalities (no evidence for pulmonary sarcoidosis).  - Should have eventual V/Q scan to rule out chronic PEs (though already anticoagulated). - Continue CPAP and home oxygen.   6. Apical thrombus: Continue warfarin.  7. Abdominal pain: Ongoing.  Has had extensive workup with GI.  ?Low flow mesenteric ischemia.  8. Orthostatic hypotension: Significant BP drop with standing.  Coreg and Imdur stopped at last visit. Lasix decreased.   Dispo: To ER.    Glori Bickers MD 04/24/2015

## 2015-04-24 NOTE — H&P (Addendum)
History and Physical  LINDZEE GOUGE OFH:[id number] DOB: 27-May-1946 DOA: 04/24/2015  Referring physician: Lorre Munroe, PA-C, ED provider PCP: Antony Blackbird, MD   Chief Complaint: Altered mental status  HPI: [name] is a 69 y.o. female  With a history of hypertension, coronary artery disease, COPD, hypertension, possible pulmonary sarcoidosis, type 2 diabetes, chronic systolic and grade 1 diastolic heart failure with LVEF of 30-35%, left ventricular thrombus on warfarin chronic, abdominal pain - presumed to be chronic mesenteric ischemia, although GI workup has been essentially negative. Patient presents with altered mental status.  He was brought to the emergency room by her brother who gives a history as the patient is not following oral directions. The patient was first noted to be abnormal by her brother when he picked her up from her house. She walked past without noticing him. The patient was taken to her urologist's office, who became concerned and sent her to the emergency department for workup. No palliating or provoking factors. Patient has remains relatively encephalopathic.   Review of Systems:  Unable to obtain secondary to patient's encephalopathy  Past Medical History  Diagnosis Date  . Essential hypertension, benign   . Coronary atherosclerosis of native coronary artery 01/04/2006    Tiny OM1 70-90% ostial stenosis, no other CAD  . COPD (chronic obstructive pulmonary disease)   . Esophageal reflux   . Dyslipidemia   . Gout   . Colon polyps 2011    Tubular adenomatous polyps  . Spinal stenosis of lumbar region     chronic low back pain.   . Bulging lumbar disc   . Chronic diastolic heart failure   . PNA (pneumonia) 2012    With pleural effusion, requiring thoracentesis  . Chronic bronchitis   . On home oxygen therapy   . OSA (obstructive sleep apnea)     Marginally compliant with CPAP  . Type II diabetes mellitus   . Arthritis   . Diverticulosis   .  GERD (gastroesophageal reflux disease)   . Anxiety   . Hyperlipidemia   . Sarcoidosis of lung   . CKD (chronic kidney disease) stage 4, GFR 15-29 ml/min   . IBS (irritable bowel syndrome)   . Cataracts, both eyes   . Headache(784.0)   . Lactic acidosis 01/21/2015  . Chronic CHF (congestive heart failure)    Past Surgical History  Procedure Laterality Date  . Vesicovaginal fistula closure w/  total abdominal hysterectomy  1992  . Bilateral total knee replacements Bilateral Rt=5/04 & Lft=1/09    by DrAlusio  . 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 Zella Richer  . Appendectomy  1957  . Tonsillectomy  1968  . Cholecystectomy  1980's  . Abdominal hysterectomy  1992  . Reduction mammaplasty Bilateral 1980's  . Cardiac catheterization  1990's  . Thoracentesis  2012  . Joint replacement    . Av fistula placement Left 12/31/2013    Procedure: ARTERIOVENOUS (AV) FISTULA CREATION;  Surgeon: Elam Dutch, MD;  Location: [place];  Service: Vascular;  Laterality: Left;  . Colonoscopy w/ biopsies and polypectomy    . Av fistula placement Left 03/18/2014    Procedure: ARTERIOVENOUS (AV) FISTULA CREATION- LEFT BRACHIOCEPHALIC ;  Surgeon: Elam Dutch, MD;  Location: [facility];  Service: Vascular;  Laterality: Left;  . Ligation of arteriovenous  fistula Left 03/18/2014    Procedure: LIGATION OF ARTERIOVENOUS  FISTULA- LEFT RADIOCEPHALIC;  Surgeon: Elam Dutch, MD;  Location: [place];  Service: Vascular;  Laterality: Left;  . Cardiac catheterization N/A 01/27/2015    Procedure: Right Heart Cath;  Surgeon: Larey Dresser, MD;  Location: [place] [facility];  Service: Cardiovascular;  Laterality: N/A;  . Esophagogastroduodenoscopy (egd) with propofol N/A 03/14/2015    Procedure: ESOPHAGOGASTRODUODENOSCOPY (EGD) WITH PROPOFOL;  Surgeon: Gatha Mayer, MD;  Location: [place];  Service: Endoscopy;  Laterality: N/A;  . Colonoscopy with propofol N/A 03/14/2015    Procedure: COLONOSCOPY WITH PROPOFOL;   Surgeon: Gatha Mayer, MD;  Location: [place];  Service: Endoscopy;  Laterality: N/A;   Social History:  reports that she has never smoked. She has never used smokeless tobacco. She reports that she does not drink alcohol or use illicit drugs. Patient lives at home & is able to participate in activities of daily living  Allergies  Allergen Reactions  . Adhesive [Tape] Other (See Comments)    Blisters   . Avelox [Moxifloxacin Hcl In Nacl] Other (See Comments)    GI upset  . Codeine Other (See Comments)    "crazy"   . Guaifenesin Nausea And Vomiting    Takes Mucinex at home without issue  . Latex Swelling  . Oxycodone Nausea And Vomiting    Takes Percocet at home without issue    Family History  Problem Relation Age of Onset  . Diabetes Mother   . Heart disease Mother   . Hyperlipidemia Mother   . Varicose Veins Mother   . Breast cancer Maternal Aunt   . Heart disease Father   . Deep vein thrombosis Father   . Hyperlipidemia Father   . Heart disease Sister     before age 63  . Cancer Sister   . Diabetes Sister   . Hyperlipidemia Sister   . Heart attack Sister   . Heart disease Brother   . Diabetes Brother   . Hyperlipidemia Brother   . Heart attack Brother   . Hyperlipidemia Sister       Prior to Admission medications   Medication Sig Start Date End Date Taking? Authorizing Provider  acetaminophen (TYLENOL) 500 MG tablet Take 1,000 mg by mouth 2 (two) times daily as needed (pain).    Yes Historical Provider, MD  albuterol (PROVENTIL HFA;VENTOLIN HFA) 108 (90 BASE) MCG/ACT inhaler Inhale 2 puffs into the lungs every 4 (four) hours as needed for wheezing or shortness of breath.    Yes Historical Provider, MD  albuterol (PROVENTIL) (2.5 MG/3ML) 0.083% nebulizer solution Take 2.5 mg by nebulization every 2 (two) hours as needed for wheezing or shortness of breath.   Yes Historical Provider, MD  Alpha-D-Galactosidase (BEANO PO) Take 1 tablet by mouth daily.   Yes  Historical Provider, MD  Apremilast (OTEZLA) 30 MG TABS Take 30 mg by mouth daily.    Yes Historical Provider, MD  atorvastatin (LIPITOR) 20 MG tablet Take 20 mg by mouth at bedtime.   Yes Historical Provider, MD  carvedilol (COREG) 3.125 MG tablet Take 1 tablet (3.125 mg total) by mouth 2 (two) times daily with a meal. 04/06/15  Yes Robbie Lis, MD  dicyclomine (BENTYL) 10 MG capsule Take 1 capsule (10 mg total) by mouth 4 (four) times daily -  before meals and at bedtime. 04/06/15  Yes Robbie Lis, MD  esomeprazole (NEXIUM) 40 MG capsule Take 1 capsule by mouth 30 min before breakfast. Patient taking differently: Take 40 mg by mouth daily.  03/03/15  Yes Amy S Esterwood, PA-C  famotidine (PEPCID) 20 MG tablet Take 1  tablet (20 mg total) by mouth daily. 02/02/15  Yes Ripudeep Krystal Eaton, MD  feeding supplement (BOOST / RESOURCE BREEZE) LIQD Take 1 Container by mouth 3 (three) times daily between meals. 03/06/15  Yes Shanker Kristeen Mans, MD  ferrous sulfate 325 (65 FE) MG tablet Take 325 mg by mouth daily with breakfast.   Yes Historical Provider, MD  fluticasone (FLONASE) 50 MCG/ACT nasal spray Place 2 sprays into both nostrils daily as needed for allergies or rhinitis.    Yes Historical Provider, MD  Fluticasone-Salmeterol (ADVAIR) 250-50 MCG/DOSE AEPB Inhale 1 puff into the lungs 2 (two) times daily. 09/25/14  Yes Noralee Space, MD  furosemide (LASIX) 80 MG tablet Take 1 tablet (80 mg total) by mouth daily. Take 80 mg (2 tablets) in am and 40 mg (1 tablet) in pm 04/06/15  Yes Robbie Lis, MD  HYDROcodone-acetaminophen (NORCO/VICODIN) 5-325 MG per tablet Take 1-2 tablets by mouth every 4 (four) hours as needed for moderate pain. 04/06/15  Yes Robbie Lis, MD  hydrocortisone (ANUSOL-HC) 2.5 % rectal cream Place 1 application rectally 4 (four) times daily. 02/28/15  Yes Amy S Esterwood, PA-C  insulin lispro (HUMALOG KWIKPEN) 100 UNIT/ML KiwkPen Inject 0.01-0.1 mLs (1-10 Units total) into the skin 3 (three) times  daily. Sliding scale CBG 70 - 120: 0 units CBG 121 - 150: 1 unit,  CBG 151 - 200: 2 units,  CBG 201 - 250: 3 units,  CBG 251 - 300: 5 units,  CBG 301 - 350: 7 units,  CBG 351 - 400: 9 units   CBG > 400: 10 units and notify your MD 02/02/15  Yes Ripudeep Krystal Eaton, MD  Multiple Vitamins-Minerals (MULTIVITAMIN WITH MINERALS) tablet Take 1 tablet by mouth daily.   Yes Historical Provider, MD  ondansetron (ZOFRAN) 4 MG tablet Take 1 tablet (4 mg total) by mouth every 8 (eight) hours as needed for nausea or vomiting. 04/06/15  Yes Robbie Lis, MD  promethazine (PHENERGAN) 25 MG tablet Take 1 tablet (25 mg total) by mouth every 4 (four) hours as needed for nausea or vomiting. 02/02/15  Yes Ripudeep Krystal Eaton, MD  saccharomyces boulardii (FLORASTOR) 250 MG capsule Take 1 capsule (250 mg total) by mouth 2 (two) times daily. 02/02/15  Yes Ripudeep Krystal Eaton, MD  sodium bicarbonate 650 MG tablet Take 1 tablet (650 mg total) by mouth 2 (two) times daily. 02/02/15  Yes Ripudeep Krystal Eaton, MD  TOUJEO SOLOSTAR 300 UNIT/ML SOPN Inject 40 Units into the skin every morning. 11/21/14  Yes Historical Provider, MD  triamcinolone cream (KENALOG) 0.1 % Apply 1 application topically 2 (two) times daily.   Yes Historical Provider, MD  warfarin (COUMADIN) 1 MG tablet Take 1.5 tablets (1.5 mg total) by mouth every evening. 04/06/15  Yes Robbie Lis, MD    Physical Exam: BP 196/93 mmHg  Pulse 103  Temp(Src) 98 F (36.7 C) (Oral)  Resp 18  SpO2 100%  General: Older black female. Awake and alert and oriented to place and person, otherwise noncommunicative. No acute cardiopulmonary distress.  Eyes: Pupils equal, round, reactive to light. Extraocular muscles are intact. Sclerae anicteric and noninjected.  ENT:  Dry mucosal membranes. No mucosal lesions.   Neck: Neck supple without lymphadenopathy. No carotid bruits. No masses palpated.  Cardiovascular: Regular rate with normal S1-S2 sounds. No murmurs, rubs, gallops auscultated. No JVD.    Respiratory: Good respiratory effort with no wheezes, rales, rhonchi. Lungs clear to auscultation bilaterally.  Abdomen: Soft, mildly  tender throughout, nondistended. Active bowel sounds. No masses or hepatosplenomegaly  Skin: Dry, warm to touch. 2+ dorsalis pedis and radial pulses. Musculoskeletal: Legs tender to palpation. All major joints not erythematous nontender.  Psychiatric: Unable to assess secondary to encephalopathy, although the patient does not exhibit danger to herself or others at this point..  Neurologic: No focal neurological deficits - although not able to assess clearly as the patient does not follow directions. Cranial nerves II through XII are grossly intact.           Labs on Admission:  Basic Metabolic Panel:  Recent Labs Lab 04/24/15 1132 04/24/15 1146  NA 135 133*  K 4.1 4.0  CL 97* 98*  CO2 25  --   GLUCOSE 213* 210*  BUN 41* 42*  CREATININE 4.07* 4.20*  CALCIUM 13.1*  --    Liver Function Tests:  Recent Labs Lab 04/24/15 1132  AST 30  ALT 16  ALKPHOS 148*  BILITOT 0.5  PROT 6.3*  ALBUMIN 3.0*   No results for input(s): LIPASE, AMYLASE in the last 168 hours.  Recent Labs Lab 04/24/15 1422  AMMONIA 33   CBC:  Recent Labs Lab 04/24/15 1132 04/24/15 1146  WBC 11.2*  --   NEUTROABS 4.4  --   HGB 14.9 16.3*  HCT 44.2 48.0*  MCV 90.8  --   PLT 576*  --    Cardiac Enzymes: No results for input(s): CKTOTAL, CKMB, CKMBINDEX, TROPONINI in the last 168 hours.  BNP (last 3 results)  Recent Labs  01/21/15 1830 02/02/15 0517 02/24/15 1612  BNP 1056.8* 2924.8* 475.5*    ProBNP (last 3 results)  Recent Labs  04/26/14 2358 06/24/14 0945  PROBNP 3725.0* 324.0*    CBG: No results for input(s): GLUCAP in the last 168 hours.  Radiological Exams on Admission: Ct Head Wo Contrast  04/24/2015   CLINICAL DATA:  Altered mental status.  Unable to speak.  Aphasia.  EXAM: CT HEAD WITHOUT CONTRAST  TECHNIQUE: Contiguous axial images  were obtained from the base of the skull through the vertex without intravenous contrast.  COMPARISON:  03/31/2012  FINDINGS: The brainstem, cerebellum, cerebral peduncles, thalamus, basal ganglia, basilar cisterns, and ventricular system appear within normal limits. No intracranial hemorrhage, mass lesion, or acute CVA.  Right mastoid effusion. No fluid in the middle ear. Globes extend 2.4 cm anterior to the interzygomatic line. Prominent orbital adipose tissue with exophthalmos.  IMPRESSION: 1. No acute intracranial findings. 2. 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 3. Right mastoid effusion.   Electronically Signed   By: Van Clines M.D.   On: 04/24/2015 12:59   Dg Chest Port 1 View  04/24/2015   CLINICAL DATA:  Shortness of breath.  EXAM: PORTABLE CHEST 1 VIEW  COMPARISON:  March 10, 2015.  FINDINGS: The heart size and mediastinal contours are within normal limits. Both lungs are clear. No pneumothorax or pleural effusion is noted. The visualized skeletal structures are unremarkable.  IMPRESSION: No acute cardiopulmonary abnormality seen.   Electronically Signed   By: Marijo Conception, M.D.   On: 04/24/2015 12:22    EKG: Independently reviewed. Normal sinus rhythm with right bundle branch block. Normal PR and QTC intervals. QRS prolonged at 0.13 in conjunction with a right bundle branch block. Q waves in 2, 3, aVF suggestive of old inferior infarct. Q waves in V3 V4 V5 and V6 suggestive of lateral infarct  Assessment/Plan Present on Admission:  . Encephalopathy .  Hypercalcemia . Metabolic alkalosis . Acute renal failure superimposed on stage 4 chronic kidney disease . Lactic acidosis  This patient was discussed with the ED physician, including pertinent vitals, physical exam findings, labs, and imaging.  We also discussed care given by the ED provider.  #1 encephalopathy  Admit  Question secondary to hypercalcemia, alkalosis, elevated  BUN. #2 hypercalcemia  At this time unsure the patient's etiology of hypercalcemia. Attends for me to question the patient regarding antiacid use, particularly the use of TUMs. Unfortunately the patient is unable to respond adequately. Certainly parathyroid related etiologies and vitamin D related etiologies need to be ruled out.  Will give patient another bolus of IV fluids - 1 L over 4 hours  Continue IV fluids at 150 mL per hour  As the patient has history of congestive heart failure, will into the patient's Lasix  Recheck calcium in the morning  We will check PTH, PTH-related hormone, vitamin D metabolites. #3 metabolic alkalosis  The acid-base disorder this patient is not entirely clear. Does appear that the patient has a combined metabolic and respiratory alkalosis in conjunction with her lactic acidosis  We'll treat the patient's hypercalcemia, which can cause a metabolic alkalosis and look for clearing of her other acid-base disorder. Check VBG in the morning #4 acute renal failure superimposed on stage IV chronic kidney disease  Fluid resuscitate #5 lactic acidosis  Will volume replace and recheck her lactic acid.  Question of sepsis - will keep on empiric antibiotics and get procalcitonin level. #6 DM2  Keep pt NPO while encephalopathic  Cut basal insulin in half.  Note - the patient is on hospice, but patient's family would like her treated initially and reevaluate in the morning.   DVT prophylaxis: warfarin  Consultants: Neurology  Code Status: DNR  Family Communication: Brother and sister, Anne Ng - please call her with updates: [phone] (Cell)  Disposition Plan: admit   Truett Mainland, DO [facility] Pager [phone]

## 2015-04-24 NOTE — Progress Notes (Signed)
Attempted to place pt on Bipap x 5 attempts.  Pt pulling mask off, attempted to explain importance of bipap, pt does not want to wear mask.  Pt continued to pull mask off w/ every attempt to place pt on bipap.  MD at bedside and aware, MD d/c bipap order. RN aware.

## 2015-04-24 NOTE — ED Provider Notes (Signed)
CSN: [id number]     Arrival date & time 04/24/15  1109 History   First MD Initiated Contact with Patient 04/24/15 1148     Chief Complaint  Patient presents with  . Aphasia  . Abdominal Pain     (Consider location/radiation/quality/duration/timing/severity/associated sxs/prior Treatment) HPI Comments: Patient with past medical history of COPD on home oxygen, 2 L at baseline, CKD stage IV, chronic systolic and diastolic CHF, left ventricular thrombus on Coumadin, diabetes type 2, sarcoidosis who presents to the ED with a chief complaint of aphasia and "not acting right."  She is accompanied by her brother, who was taking her to her doctor's office and noticed the symptoms.  Last known normal is unknown as nobody stays with the patient, but brother states that symptoms were new to him this morning.  Brother reports that she has been in and out of the hospital multiple times for abdominal cramping, nausea and vomiting. She has had extensive GI workup since June 2016 which was essentially negative per gastroenterology but thought that possibility includes chronic mesenteric ischemia.   The history is provided by the patient. No language interpreter was used.    Past Medical History  Diagnosis Date  . Essential hypertension, benign   . Coronary atherosclerosis of native coronary artery 01/04/2006    Tiny OM1 70-90% ostial stenosis, no other CAD  . COPD (chronic obstructive pulmonary disease)   . Esophageal reflux   . Dyslipidemia   . Gout   . Colon polyps 2011    Tubular adenomatous polyps  . Spinal stenosis of lumbar region     chronic low back pain.   . Bulging lumbar disc   . Chronic diastolic heart failure   . PNA (pneumonia) 2012    With pleural effusion, requiring thoracentesis  . Chronic bronchitis   . On home oxygen therapy   . OSA (obstructive sleep apnea)     Marginally compliant with CPAP  . Type II diabetes mellitus   . Arthritis   . Diverticulosis   . GERD  (gastroesophageal reflux disease)   . Anxiety   . Hyperlipidemia   . Sarcoidosis of lung   . CKD (chronic kidney disease) stage 4, GFR 15-29 ml/min   . IBS (irritable bowel syndrome)   . Cataracts, both eyes   . Headache(784.0)   . Lactic acidosis 01/21/2015  . Chronic CHF (congestive heart failure)    Past Surgical History  Procedure Laterality Date  . Vesicovaginal fistula closure w/  total abdominal hysterectomy  1992  . Bilateral total knee replacements Bilateral Rt=5/04 & Lft=1/09    by DrAlusio  . 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 Zella Richer  . Appendectomy  1957  . Tonsillectomy  1968  . Cholecystectomy  1980's  . Abdominal hysterectomy  1992  . Reduction mammaplasty Bilateral 1980's  . Cardiac catheterization  1990's  . Thoracentesis  2012  . Joint replacement    . Av fistula placement Left 12/31/2013    Procedure: ARTERIOVENOUS (AV) FISTULA CREATION;  Surgeon: Elam Dutch, MD;  Location: [facility];  Service: Vascular;  Laterality: Left;  . Colonoscopy w/ biopsies and polypectomy    . Av fistula placement Left 03/18/2014    Procedure: ARTERIOVENOUS (AV) FISTULA CREATION- LEFT BRACHIOCEPHALIC ;  Surgeon: Elam Dutch, MD;  Location: [facility];  Service: Vascular;  Laterality: Left;  . Ligation of arteriovenous  fistula Left 03/18/2014    Procedure: LIGATION OF ARTERIOVENOUS  FISTULA- LEFT RADIOCEPHALIC;  Surgeon: Juanda Crumble  Antony Blackbird, MD;  Location: [facility];  Service: Vascular;  Laterality: Left;  . Cardiac catheterization N/A 01/27/2015    Procedure: Right Heart Cath;  Surgeon: Larey Dresser, MD;  Location: [facility];  Service: Cardiovascular;  Laterality: N/A;  . Esophagogastroduodenoscopy (egd) with propofol N/A 03/14/2015    Procedure: ESOPHAGOGASTRODUODENOSCOPY (EGD) WITH PROPOFOL;  Surgeon: Gatha Mayer, MD;  Location: [facility];  Service: Endoscopy;  Laterality: N/A;  . Colonoscopy with propofol N/A 03/14/2015    Procedure: COLONOSCOPY WITH PROPOFOL;  Surgeon:  Gatha Mayer, MD;  Location: [facility];  Service: Endoscopy;  Laterality: N/A;   Family History  Problem Relation Age of Onset  . Diabetes Mother   . Heart disease Mother   . Hyperlipidemia Mother   . Varicose Veins Mother   . Breast cancer Maternal Aunt   . Heart disease Father   . Deep vein thrombosis Father   . Hyperlipidemia Father   . Heart disease Sister     before age 35  . Cancer Sister   . Diabetes Sister   . Hyperlipidemia Sister   . Heart attack Sister   . Heart disease Brother   . Diabetes Brother   . Hyperlipidemia Brother   . Heart attack Brother   . Hyperlipidemia Sister    Social History  Substance Use Topics  . Smoking status: Never Smoker   . Smokeless tobacco: Never Used  . Alcohol Use: No   OB History    No data available     Review of Systems  Unable to perform ROS: Patient nonverbal      Allergies  Adhesive; Avelox; Codeine; Guaifenesin; Latex; and Oxycodone  Home Medications   Prior to Admission medications   Medication Sig Start Date End Date Taking? Authorizing Provider  acetaminophen (TYLENOL) 500 MG tablet Take 1,000 mg by mouth 2 (two) times daily as needed (pain).     Historical Provider, MD  albuterol (PROVENTIL HFA;VENTOLIN HFA) 108 (90 BASE) MCG/ACT inhaler Inhale 2 puffs into the lungs every 4 (four) hours as needed for wheezing or shortness of breath.     Historical Provider, MD  albuterol (PROVENTIL) (2.5 MG/3ML) 0.083% nebulizer solution Take 2.5 mg by nebulization every 2 (two) hours as needed for wheezing or shortness of breath.    Historical Provider, MD  Alpha-D-Galactosidase (BEANO PO) Take 1 tablet by mouth daily.    Historical Provider, MD  Apremilast (OTEZLA) 30 MG TABS Take 30 mg by mouth daily.     Historical Provider, MD  atorvastatin (LIPITOR) 20 MG tablet Take 20 mg by mouth at bedtime.    Historical Provider, MD  carvedilol (COREG) 3.125 MG tablet Take 1 tablet (3.125 mg total) by mouth 2 (two) times daily  with a meal. 04/06/15   Robbie Lis, MD  dicyclomine (BENTYL) 10 MG capsule Take 1 capsule (10 mg total) by mouth 4 (four) times daily -  before meals and at bedtime. 04/06/15   Robbie Lis, MD  esomeprazole (NEXIUM) 40 MG capsule Take 1 capsule by mouth 30 min before breakfast. Patient taking differently: Take 40 mg by mouth daily.  03/03/15   Amy S Esterwood, PA-C  famotidine (PEPCID) 20 MG tablet Take 1 tablet (20 mg total) by mouth daily. 02/02/15   Ripudeep Krystal Eaton, MD  feeding supplement (BOOST / RESOURCE BREEZE) LIQD Take 1 Container by mouth 3 (three) times daily between meals. 03/06/15   Jonetta Osgood, MD  ferrous sulfate 325 (65 FE)  MG tablet Take 325 mg by mouth daily with breakfast.    Historical Provider, MD  fluticasone (FLONASE) 50 MCG/ACT nasal spray Place 2 sprays into both nostrils daily as needed for allergies or rhinitis.     Historical Provider, MD  Fluticasone-Salmeterol (ADVAIR) 250-50 MCG/DOSE AEPB Inhale 1 puff into the lungs 2 (two) times daily. 09/25/14   Noralee Space, MD  furosemide (LASIX) 80 MG tablet Take 1 tablet (80 mg total) by mouth daily. Take 80 mg (2 tablets) in am and 40 mg (1 tablet) in pm 04/06/15   Robbie Lis, MD  HYDROcodone-acetaminophen (NORCO/VICODIN) 5-325 MG per tablet Take 1-2 tablets by mouth every 4 (four) hours as needed for moderate pain. 04/06/15   Robbie Lis, MD  hydrocortisone (ANUSOL-HC) 2.5 % rectal cream Place 1 application rectally 4 (four) times daily. 02/28/15   Amy S Esterwood, PA-C  insulin lispro (HUMALOG KWIKPEN) 100 UNIT/ML KiwkPen Inject 0.01-0.1 mLs (1-10 Units total) into the skin 3 (three) times daily. Sliding scale CBG 70 - 120: 0 units CBG 121 - 150: 1 unit,  CBG 151 - 200: 2 units,  CBG 201 - 250: 3 units,  CBG 251 - 300: 5 units,  CBG 301 - 350: 7 units,  CBG 351 - 400: 9 units   CBG > 400: 10 units and notify your MD 02/02/15   Ripudeep Krystal Eaton, MD  Multiple Vitamins-Minerals (MULTIVITAMIN WITH MINERALS) tablet Take 1 tablet by mouth  daily.    Historical Provider, MD  ondansetron (ZOFRAN) 4 MG tablet Take 1 tablet (4 mg total) by mouth every 8 (eight) hours as needed for nausea or vomiting. 04/06/15   Robbie Lis, MD  promethazine (PHENERGAN) 25 MG tablet Take 1 tablet (25 mg total) by mouth every 4 (four) hours as needed for nausea or vomiting. 02/02/15   Ripudeep Krystal Eaton, MD  saccharomyces boulardii (FLORASTOR) 250 MG capsule Take 1 capsule (250 mg total) by mouth 2 (two) times daily. 02/02/15   Ripudeep Krystal Eaton, MD  sodium bicarbonate 650 MG tablet Take 1 tablet (650 mg total) by mouth 2 (two) times daily. 02/02/15   Ripudeep Krystal Eaton, MD  TOUJEO SOLOSTAR 300 UNIT/ML SOPN Inject 40 Units into the skin every morning. 11/21/14   Historical Provider, MD  triamcinolone cream (KENALOG) 0.1 % Apply 1 application topically 2 (two) times daily.    Historical Provider, MD  warfarin (COUMADIN) 1 MG tablet Take 1.5 tablets (1.5 mg total) by mouth every evening. 04/06/15   Robbie Lis, MD   BP 119/65 mmHg  Pulse 76  Temp(Src) 98 F (36.7 C) (Oral)  Resp 23  SpO2 100% Physical Exam  Constitutional: She appears well-developed and well-nourished.  HENT:  Head: Normocephalic and atraumatic.  Eyes: Conjunctivae and EOM are normal. Pupils are equal, round, and reactive to light.  Neck: Normal range of motion. Neck supple.  Cardiovascular: Normal rate and regular rhythm.  Exam reveals no gallop and no friction rub.   No murmur heard. Pulmonary/Chest: Effort normal and breath sounds normal. No respiratory distress. She has no wheezes. She has no rales. She exhibits no tenderness.  Mild increased work of breathing with mild accessory muscle use  Abdominal: Soft. Bowel sounds are normal. She exhibits no distension and no mass. There is no tenderness. There is no rebound and no guarding.  Some abdominal tenderness with palpation, but no focal tenderness  Musculoskeletal: Normal range of motion. She exhibits no edema or tenderness.  Skin: Skin  is warm  and dry.  Psychiatric: She has a normal mood and affect. Her behavior is normal. Judgment and thought content normal.  Nursing note and vitals reviewed.   ED Course  Procedures (including critical care time) Results for orders placed or performed during the hospital encounter of 04/24/15  Protime-INR  Result Value Ref Range   Prothrombin Time 26.3 (H) 11.6 - 15.2 seconds   INR 2.45 (H) 0.00 - 1.49  APTT  Result Value Ref Range   aPTT 36 24 - 37 seconds  CBC  Result Value Ref Range   WBC 11.2 (H) 4.0 - 10.5 K/uL   RBC 4.87 3.87 - 5.11 MIL/uL   Hemoglobin 14.9 12.0 - 15.0 g/dL   HCT 44.2 36.0 - 46.0 %   MCV 90.8 78.0 - 100.0 fL   MCH 30.6 26.0 - 34.0 pg   MCHC 33.7 30.0 - 36.0 g/dL   RDW 15.0 11.5 - 15.5 %   Platelets 576 (H) 150 - 400 K/uL  Differential  Result Value Ref Range   Neutrophils Relative % 39 %   Lymphocytes Relative 49 %   Monocytes Relative 11 %   Eosinophils Relative 1 %   Basophils Relative 0 %   Neutro Abs 4.4 1.7 - 7.7 K/uL   Lymphs Abs 5.5 (H) 0.7 - 4.0 K/uL   Monocytes Absolute 1.2 (H) 0.1 - 1.0 K/uL   Eosinophils Absolute 0.1 0.0 - 0.7 K/uL   Basophils Absolute 0.0 0.0 - 0.1 K/uL   WBC Morphology ATYPICAL LYMPHOCYTES   Comprehensive metabolic panel  Result Value Ref Range   Sodium 135 135 - 145 mmol/L   Potassium 4.1 3.5 - 5.1 mmol/L   Chloride 97 (L) 101 - 111 mmol/L   CO2 25 22 - 32 mmol/L   Glucose, Bld 213 (H) 65 - 99 mg/dL   BUN 41 (H) 6 - 20 mg/dL   Creatinine, Ser 4.07 (H) 0.44 - 1.00 mg/dL   Calcium 13.1 (HH) 8.9 - 10.3 mg/dL   Total Protein 6.3 (L) 6.5 - 8.1 g/dL   Albumin 3.0 (L) 3.5 - 5.0 g/dL   AST 30 15 - 41 U/L   ALT 16 14 - 54 U/L   Alkaline Phosphatase 148 (H) 38 - 126 U/L   Total Bilirubin 0.5 0.3 - 1.2 mg/dL   GFR calc non Af Amer 10 (L) >60 mL/min   GFR calc Af Amer 12 (L) >60 mL/min   Anion gap 13 5 - 15  Ammonia  Result Value Ref Range   Ammonia 33 9 - 35 umol/L  I-stat troponin, ED (not at [facility], [facility])  Result Value  Ref Range   Troponin i, poc 0.05 0.00 - 0.08 ng/mL   Comment 3          I-Stat Chem 8, ED  (not at [facility])  Result Value Ref Range   Sodium 133 (L) 135 - 145 mmol/L   Potassium 4.0 3.5 - 5.1 mmol/L   Chloride 98 (L) 101 - 111 mmol/L   BUN 42 (H) 6 - 20 mg/dL   Creatinine, Ser 4.20 (H) 0.44 - 1.00 mg/dL   Glucose, Bld 210 (H) 65 - 99 mg/dL   Calcium, Ion 1.58 (H) 1.13 - 1.30 mmol/L   TCO2 25 0 - 100 mmol/L   Hemoglobin 16.3 (H) 12.0 - 15.0 g/dL   HCT 48.0 (H) 36.0 - 46.0 %  I-Stat CG4 Lactic Acid, ED  Result Value Ref Range   Lactic Acid, Venous  3.64 (HH) 0.5 - 2.0 mmol/L   Comment NOTIFIED PHYSICIAN   I-Stat CG4 Lactic Acid, ED  Result Value Ref Range   Lactic Acid, Venous 3.70 (HH) 0.5 - 2.0 mmol/L   Comment NOTIFIED PHYSICIAN   I-Stat venous blood gas, ED  Result Value Ref Range   pH, Ven 7.531 (H) 7.250 - 7.300   pCO2, Ven 28.8 (L) 45.0 - 50.0 mmHg   pO2, Ven 23.0 (LL) 30.0 - 45.0 mmHg   Bicarbonate 24.1 (H) 20.0 - 24.0 mEq/L   TCO2 25 0 - 100 mmol/L   O2 Saturation 50.0 %   Acid-Base Excess 3.0 (H) 0.0 - 2.0 mmol/L   Patient temperature HIDE    Sample type VENOUS    Comment NOTIFIED PHYSICIAN    Ct Head Wo Contrast  04/24/2015   CLINICAL DATA:  Altered mental status.  Unable to speak.  Aphasia.  EXAM: CT HEAD WITHOUT CONTRAST  TECHNIQUE: Contiguous axial images were obtained from the base of the skull through the vertex without intravenous contrast.  COMPARISON:  03/31/2012  FINDINGS: The brainstem, cerebellum, cerebral peduncles, thalamus, basal ganglia, basilar cisterns, and ventricular system appear within normal limits. No intracranial hemorrhage, mass lesion, or acute CVA.  Right mastoid effusion. No fluid in the middle ear. Globes extend 2.4 cm anterior to the interzygomatic line. Prominent orbital adipose tissue with exophthalmos.  IMPRESSION: 1. No acute intracranial findings. 2. 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 3.  Right mastoid effusion.   Electronically Signed   By: Van Clines M.D.   On: 04/24/2015 12:59   Dg Chest Port 1 View  04/24/2015   CLINICAL DATA:  Shortness of breath.  EXAM: PORTABLE CHEST 1 VIEW  COMPARISON:  March 10, 2015.  FINDINGS: The heart size and mediastinal contours are within normal limits. Both lungs are clear. No pneumothorax or pleural effusion is noted. The visualized skeletal structures are unremarkable.  IMPRESSION: No acute cardiopulmonary abnormality seen.   Electronically Signed   By: Marijo Conception, M.D.   On: 04/24/2015 12:22       EKG Interpretation None      MDM   Final diagnoses:  Altered mental status, unspecified altered mental status type  Aphasia   Patient with AMS and aphasia.  Will get stroke workup and reassess.  Patient last known normal unknown, but altered and symptomatic when she awoke this morning.  Lactic acid is 3.64, VSS, no fever.  Possible infection.  Will check CXR and UA and start abx.  Cr 4.2, hx of stage IV CKD, but up from baseline of around 2.5.    Patient seen by and discussed with Dr. Thomasene Lot, who recommends adding VBG, ammonia, and TSH.   Patient discussed with Dr. Doy Mince, who will consult.  Patient will be admitted to medicine.  Appreciate neurology consultation.  Appreciate Dr. Nehemiah Settle for admitting the patient for AMS.  Critical Ca 13.1  CRITICAL CARE Performed by: Montine Circle   Total critical care time: 45  Critical care time was exclusive of separately billable procedures and treating other patients.  Critical care was necessary to treat or prevent imminent or life-threatening deterioration.  Critical care was time spent personally by me on the following activities: development of treatment plan with patient and/or surrogate as well as nursing, discussions with consultants, evaluation of patient's response to treatment, examination of patient, obtaining history from patient or surrogate, ordering and  performing treatments and interventions, ordering and review of laboratory  studies, ordering and review of radiographic studies, pulse oximetry and re-evaluation of patient's condition.     Montine Circle, PA-C 04/24/15 [place], MD 04/24/15 [phone]

## 2015-04-25 ENCOUNTER — Inpatient Hospital Stay (HOSPITAL_COMMUNITY)

## 2015-04-25 ENCOUNTER — Ambulatory Visit (HOSPITAL_COMMUNITY): Payer: Medicare Other

## 2015-04-25 DIAGNOSIS — E440 Moderate protein-calorie malnutrition: Secondary | ICD-10-CM | POA: Insufficient documentation

## 2015-04-25 DIAGNOSIS — J449 Chronic obstructive pulmonary disease, unspecified: Secondary | ICD-10-CM

## 2015-04-25 DIAGNOSIS — R4701 Aphasia: Secondary | ICD-10-CM

## 2015-04-25 DIAGNOSIS — N179 Acute kidney failure, unspecified: Secondary | ICD-10-CM

## 2015-04-25 DIAGNOSIS — I5022 Chronic systolic (congestive) heart failure: Secondary | ICD-10-CM

## 2015-04-25 DIAGNOSIS — E873 Alkalosis: Secondary | ICD-10-CM

## 2015-04-25 DIAGNOSIS — G9340 Encephalopathy, unspecified: Principal | ICD-10-CM

## 2015-04-25 DIAGNOSIS — E872 Acidosis: Secondary | ICD-10-CM

## 2015-04-25 DIAGNOSIS — E118 Type 2 diabetes mellitus with unspecified complications: Secondary | ICD-10-CM

## 2015-04-25 DIAGNOSIS — N184 Chronic kidney disease, stage 4 (severe): Secondary | ICD-10-CM

## 2015-04-25 LAB — COMPREHENSIVE METABOLIC PANEL
ALBUMIN: 2.2 g/dL — AB (ref 3.5–5.0)
ALK PHOS: 109 U/L (ref 38–126)
ALT: 12 U/L — ABNORMAL LOW (ref 14–54)
ANION GAP: 6 (ref 5–15)
AST: 24 U/L (ref 15–41)
BUN: 36 mg/dL — AB (ref 6–20)
CALCIUM: 10.5 mg/dL — AB (ref 8.9–10.3)
CO2: 25 mmol/L (ref 22–32)
Chloride: 102 mmol/L (ref 101–111)
Creatinine, Ser: 3.61 mg/dL — ABNORMAL HIGH (ref 0.44–1.00)
GFR calc Af Amer: 14 mL/min — ABNORMAL LOW (ref >60)
GFR calc non Af Amer: 12 mL/min — ABNORMAL LOW (ref >60)
GLUCOSE: 92 mg/dL (ref 65–99)
POTASSIUM: 3.7 mmol/L (ref 3.5–5.1)
SODIUM: 133 mmol/L — AB (ref 135–145)
Total Bilirubin: 0.5 mg/dL (ref 0.3–1.2)
Total Protein: 4.9 g/dL — ABNORMAL LOW (ref 6.5–8.1)

## 2015-04-25 LAB — BLOOD GAS, VENOUS
Acid-base deficit: 1.1 mmol/L (ref 0.0–2.0)
Bicarbonate: 24.2 mEq/L — ABNORMAL HIGH (ref 20.0–24.0)
O2 Content: 4 L/min
O2 Saturation: 75 %
PH VEN: 7.322 — AB (ref 7.250–7.300)
Patient temperature: 98.6
TCO2: 25.6 mmol/L (ref 0–100)
pCO2, Ven: 48.1 mmHg (ref 45.0–50.0)
pO2, Ven: 43.3 mmHg (ref 30.0–45.0)

## 2015-04-25 LAB — CBC
HEMATOCRIT: 38.3 % (ref 36.0–46.0)
HEMOGLOBIN: 12.9 g/dL (ref 12.0–15.0)
MCH: 30.4 pg (ref 26.0–34.0)
MCHC: 33.7 g/dL (ref 30.0–36.0)
MCV: 90.3 fL (ref 78.0–100.0)
Platelets: 477 10*3/uL — ABNORMAL HIGH (ref 150–400)
RBC: 4.24 MIL/uL (ref 3.87–5.11)
RDW: 15.4 % (ref 11.5–15.5)
WBC: 9.4 10*3/uL (ref 4.0–10.5)

## 2015-04-25 LAB — VITAMIN D 25 HYDROXY (VIT D DEFICIENCY, FRACTURES): Vit D, 25-Hydroxy: 21.5 ng/mL — ABNORMAL LOW (ref 30.0–100.0)

## 2015-04-25 LAB — GLUCOSE, CAPILLARY
GLUCOSE-CAPILLARY: 116 mg/dL — AB (ref 65–99)
GLUCOSE-CAPILLARY: 153 mg/dL — AB (ref 65–99)
GLUCOSE-CAPILLARY: 99 mg/dL (ref 65–99)
Glucose-Capillary: 126 mg/dL — ABNORMAL HIGH (ref 65–99)

## 2015-04-25 LAB — PROTIME-INR
INR: 2.07 — AB (ref 0.00–1.49)
PROTHROMBIN TIME: 23.2 s — AB (ref 11.6–15.2)

## 2015-04-25 LAB — PARATHYROID HORMONE, INTACT (NO CA): PTH: 13 pg/mL — ABNORMAL LOW (ref 15–65)

## 2015-04-25 MED ORDER — PRO-STAT SUGAR FREE PO LIQD
30.0000 mL | Freq: Two times a day (BID) | ORAL | Status: DC
  Administered 2015-04-25 – 2015-04-28 (×6): 30 mL via ORAL
  Filled 2015-04-25 (×10): qty 30

## 2015-04-25 NOTE — Progress Notes (Signed)
Subjective: Patient improved today.  Not agitated.  Not perseverating.    Objective: Current vital signs: BP 138/54 mmHg  Pulse 82  Temp(Src) 98.1 F (36.7 C) (Oral)  Resp 18  Wt 73.392 kg (161 lb 12.8 oz)  SpO2 100% Vital signs in last 24 hours: Temp:  [97.6 F (36.4 C)-98.2 F (36.8 C)] 98.1 F (36.7 C) (09/23 0543) Pulse Rate:  [59-103] 82 (09/23 0543) Resp:  [18-30] 18 (09/23 0543) BP: (119-196)/(54-114) 138/54 mmHg (09/23 0543) SpO2:  [93 %-100 %] 100 % (09/23 0543) Weight:  [72.576 kg (160 lb)-73.392 kg (161 lb 12.8 oz)] 73.392 kg (161 lb 12.8 oz) (09/23 0543)  Intake/Output from previous day: 09/22 0701 - 09/23 0700 In: 2145 [P.O.:340; I.V.:1705; IV Piggyback:100] Out: 625 [Urine:625] Intake/Output this shift:   Nutritional status: Diet clear liquid Room service appropriate?: Yes; Fluid consistency:: Thin  Neurologic Exam: Mental Status: Alert, oriented.  Follows commands.  Speech fluent.   Cranial Nerves: II: Discs flat bilaterally; Visual fields grossly normal, pupils equal, round, reactive to light and accommodation III,IV, VI: ptosis not present, extra-ocular motions intact bilaterally V,VII: smile symmetric, facial light touch sensation normal bilaterally VIII: hearing normal bilaterally IX,X: uvula rises symmetrically XI: bilateral shoulder shrug XII: midline tongue extension Motor: Moving all extremities antigravity Sensory: Light sensation intact in all extremities.  Deep Tendon Reflexes: 2+ and symmetric throughout Plantars: Right: downgoingLeft: downgoing  Lab Results: Basic Metabolic Panel:  Recent Labs Lab 04/24/15 1132 04/24/15 1146 04/25/15 0421  NA 135 133* 133*  K 4.1 4.0 3.7  CL 97* 98* 102  CO2 25  --  25  GLUCOSE 213* 210* 92  BUN 41* 42* 36*  CREATININE 4.07* 4.20* 3.61*  CALCIUM 13.1*  --  10.5*    Liver Function Tests:  Recent Labs Lab 04/24/15 1132 04/25/15 0421  AST 30 24  ALT 16  12*  ALKPHOS 148* 109  BILITOT 0.5 0.5  PROT 6.3* 4.9*  ALBUMIN 3.0* 2.2*   No results for input(s): LIPASE, AMYLASE in the last 168 hours.  Recent Labs Lab 04/24/15 1422  AMMONIA 33    CBC:  Recent Labs Lab 04/24/15 1132 04/24/15 1146 04/25/15 0421  WBC 11.2*  --  9.4  NEUTROABS 4.4  --   --   HGB 14.9 16.3* 12.9  HCT 44.2 48.0* 38.3  MCV 90.8  --  90.3  PLT 576*  --  477*    Cardiac Enzymes: No results for input(s): CKTOTAL, CKMB, CKMBINDEX, TROPONINI in the last 168 hours.  Lipid Panel: No results for input(s): CHOL, TRIG, HDL, CHOLHDL, VLDL, LDLCALC in the last 168 hours.  CBG:  Recent Labs Lab 04/24/15 1808 04/24/15 2112 04/24/15 2340 04/25/15 0539  GLUCAP 158* 225* 197* 99    Microbiology: Results for orders placed or performed during the hospital encounter of 04/02/15  Urine culture     Status: None   Collection Time: 04/02/15  3:30 PM  Result Value Ref Range Status   Specimen Description URINE, CLEAN CATCH  Final   Special Requests NONE  Final   Culture   Final    MULTIPLE SPECIES PRESENT, SUGGEST RECOLLECTION Performed at [facility]    Report Status 04/04/2015 FINAL  Final    Coagulation Studies:  Recent Labs  04/24/15 1132 04/25/15 0421  LABPROT 26.3* 23.2*  INR 2.45* 2.07*    Imaging: Ct Head Wo Contrast  04/24/2015   CLINICAL DATA:  Altered mental status.  Unable to speak.  Aphasia.  EXAM:  CT HEAD WITHOUT CONTRAST  TECHNIQUE: Contiguous axial images were obtained from the base of the skull through the vertex without intravenous contrast.  COMPARISON:  03/31/2012  FINDINGS: The brainstem, cerebellum, cerebral peduncles, thalamus, basal ganglia, basilar cisterns, and ventricular system appear within normal limits. No intracranial hemorrhage, mass lesion, or acute CVA.  Right mastoid effusion. No fluid in the middle ear. Globes extend 2.4 cm anterior to the interzygomatic line. Prominent orbital adipose tissue with  exophthalmos.  IMPRESSION: 1. No acute intracranial findings. 2. 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 3. Right mastoid effusion.   Electronically Signed   By: Van Clines M.D.   On: 04/24/2015 12:59   Dg Chest Port 1 View  04/24/2015   CLINICAL DATA:  Shortness of breath.  EXAM: PORTABLE CHEST 1 VIEW  COMPARISON:  March 10, 2015.  FINDINGS: The heart size and mediastinal contours are within normal limits. Both lungs are clear. No pneumothorax or pleural effusion is noted. The visualized skeletal structures are unremarkable.  IMPRESSION: No acute cardiopulmonary abnormality seen.   Electronically Signed   By: Marijo Conception, M.D.   On: 04/24/2015 12:22    Medications:  I have reviewed the patient's current medications. Scheduled: . Apremilast  30 mg Oral Daily  . atorvastatin  20 mg Oral QHS  . carvedilol  3.125 mg Oral BID WC  . dicyclomine  10 mg Oral TID AC & HS  . famotidine  20 mg Oral Daily  . furosemide  80 mg Oral BID  . insulin aspart  0-15 Units Subcutaneous Q6H  . insulin glargine  20 Units Subcutaneous Daily  . mometasone-formoterol  2 puff Inhalation BID  . pantoprazole  80 mg Oral Q1200  . piperacillin-tazobactam (ZOSYN)  IV  2.25 g Intravenous Q6H  . saccharomyces boulardii  250 mg Oral BID  . [START ON 04/26/2015] vancomycin  1,000 mg Intravenous Q48H  . warfarin  1.5 mg Oral QPM  . Warfarin - Physician Dosing Inpatient   Does not apply q1800    Assessment/Plan: Patient improved.  Metabolic encephalopathy likely cause for her presentation.  Seizure much less likely.  Remains therapeutic on Coumadin.  Recommendations: 1.  EEG not indicated at this time.  Will D/C 2.  MRI pending.     LOS: 1 day   Alexis Goodell, MD [facility] [phone] 04/25/2015  8:19 AM

## 2015-04-25 NOTE — Progress Notes (Addendum)
[facility]                                                                              Patient Demographics  [name], is a 69 y.o. female, DOB - [birth year], HYW:[id number]  Admit date - 04/24/2015   Admitting Physician Truett Mainland, DO  Outpatient Primary MD for the patient is FULP, CAMMIE, MD  LOS - 1   Chief Complaint  Patient presents with  . Aphasia  . Abdominal Pain      HPI on 04/24/2015 by Dr. Loma Boston [name] is a 69 y.o. 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 Patient presents with altered mental status. He was brought to the emergency room by her brother who gives a history as the patient is not following oral directions. The patient was first noted to be abnormal by her brother when he picked her up from her house. She walked past without noticing him. The patient was taken to her urologist's office, who became concerned and sent her to the emergency department for workup. No palliating or provoking factors. Patient has remains relatively encephalopathic.  Assessment & Plan   Acute encephalopathy -Appears to be improving (upon admission, patient was noted to have some question of aphasia/dysarthria) -patient able to answer questions and follow commands this morning -CT of the head: No acute intracranial findings -Neurology consulted and appreciated -Pending MRI of the brain -Currently afebrile with no leukocytosis -UA/CXR unremarkable  Hypercalcemia -Unknown etiology -Question whether patient has been using TUMS -Vitamin D 21.5, PTH 13 (start   Metabolic alkalosis/ lactic acidosis -Will continue to monitor and  repeat lactic acid level -Started on vanc/zosyn empirically  Acute on chronic kidney disease, stage IV -Creatinine improving, currently 3.61 -Follows with Dr. Joelyn Oms  Chronic systolic heart failure -Does not appear to be volume overloaded at this time, currently compensated -Echocardiogram shows EF of 30-35% -Continue coreg, lasix, statin  Diabetes mellitus, type II -Continue Lantus, ISS, with CBG monitoring  Coronary artery disease -Suspected ischemic cardiomyopathy -Patient is on Coumadin not aspirin -Currently chest pain-free -Continue coreg, statin  Apical thrombus -Continue Coumadin  Chronic abdominal pain/GERD -Continue bentyl, PPI, pepcid  COPD  -Continue 2L O2, dulera, albuterol PRN  Deconditioning -PT and OT consulted -Nutrition consulted   Code Status: DNR  Family Communication: None at bedside  Disposition Plan: Admitted. Pending workup  Time Spent in minutes   30 minutes  Procedures  None  Consults   Neurology  DVT Prophylaxis  Coumadin  Lab Results  Component Value Date   PLT 477* 04/25/2015    Medications  Scheduled Meds: . Apremilast  30 mg Oral Daily  . atorvastatin  20 mg Oral QHS  . carvedilol  3.125 mg Oral BID WC  . dicyclomine  10 mg Oral TID AC & HS  . famotidine  20 mg Oral Daily  . furosemide  80 mg Oral BID  . insulin aspart  0-15 Units Subcutaneous Q6H  . insulin glargine  20 Units Subcutaneous Daily  . mometasone-formoterol  2 puff Inhalation BID  . pantoprazole  80 mg Oral Q1200  . piperacillin-tazobactam (ZOSYN)  IV  2.25 g Intravenous Q6H  . saccharomyces boulardii  250 mg Oral BID  . [START ON 04/26/2015] vancomycin  1,000 mg Intravenous Q48H  . warfarin  1.5 mg Oral QPM  . Warfarin - Physician Dosing Inpatient   Does not apply q1800   Continuous Infusions: . sodium chloride 150 mL/hr at 04/25/15 0400   PRN Meds:.acetaminophen **OR** acetaminophen, albuterol, alum & mag hydroxide-simeth, fluticasone,  HYDROcodone-acetaminophen, ondansetron **OR** ondansetron (ZOFRAN) IV, promethazine  Antibiotics    Anti-infectives    Start     Dose/Rate Route Frequency Ordered Stop   04/26/15 1500  vancomycin (VANCOCIN) IVPB 1000 mg/200 mL premix     1,000 mg 200 mL/hr over 60 Minutes Intravenous Every 48 hours 04/24/15 1412     04/24/15 2030  piperacillin-tazobactam (ZOSYN) IVPB 2.25 g     2.25 g 100 mL/hr over 30 Minutes Intravenous Every 6 hours 04/24/15 1412     04/24/15 1415  piperacillin-tazobactam (ZOSYN) IVPB 3.375 g     3.375 g 100 mL/hr over 30 Minutes Intravenous  Once 04/24/15 1400 04/24/15 1459   04/24/15 1415  vancomycin (VANCOCIN) 1,500 mg in sodium chloride 0.9 % 500 mL IVPB     1,500 mg 250 mL/hr over 120 Minutes Intravenous STAT 04/24/15 1400 04/24/15 1659      Subjective:   [place] seen and examined today.  Patient states she is feeling very tired and fatigued. Denies any chest pain or shortness of breath although patient wears 2 L of oxygen at home. Denies any abdominal pain, nausea, vomiting, dizziness or headache.    Objective:   Filed Vitals:   04/24/15 2139 04/25/15 0041 04/25/15 0543 04/25/15 1000  BP:  143/66 138/54 124/65  Pulse: 82 76 82 64  Temp:  98.2 F (36.8 C) 98.1 F (36.7 C) 97.6 F (36.4 C)  TempSrc:  Oral Oral Oral  Resp: 18 18 18    Weight:   73.392 kg (161 lb 12.8 oz)   SpO2: 99% 100% 100% 100%    Wt Readings from Last 3 Encounters:  04/25/15 73.392 kg (161 lb 12.8 oz)  04/24/15 72.576 kg (160 lb)  04/02/15 69.4 kg (153 lb)     Intake/Output Summary (Last 24 hours) at 04/25/15 1104 Last data filed at 04/25/15 1038  Gross per 24 hour  Intake   2505 ml  Output    625 ml  Net   1880 ml    Exam  General: Well developed, well nourished, NAD, appears stated age  13: NCAT,mucous membranes moist.   Cardiovascular: S1 S2 auscultated, no rubs, murmurs or gallops. Regular rate and rhythm.  Respiratory: Clear to auscultation  bilaterally with equal chest rise  Abdomen: Soft, nontender, nondistended, + bowel sounds  Extremities: warm dry without cyanosis clubbing or edema  Neuro: AAOx3, nonfocal, no aphasia  Psych: Appropriate mood and affect  Data Review   Micro Results No results found for this or any previous visit (from the past 240 hour(s)).  Radiology Reports Ct Head Wo Contrast  04/24/2015   CLINICAL DATA:  Altered mental status.  Unable to speak.  Aphasia.  EXAM: CT HEAD WITHOUT CONTRAST  TECHNIQUE: Contiguous axial images were obtained from the base of the skull through the vertex without intravenous contrast.  COMPARISON:  03/31/2012  FINDINGS: The brainstem,  cerebellum, cerebral peduncles, thalamus, basal ganglia, basilar cisterns, and ventricular system appear within normal limits. No intracranial hemorrhage, mass lesion, or acute CVA.  Right mastoid effusion. No fluid in the middle ear. Globes extend 2.4 cm anterior to the interzygomatic line. Prominent orbital adipose tissue with exophthalmos.  IMPRESSION: 1. No acute intracranial findings. 2. 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 3. Right mastoid effusion.   Electronically Signed   By: Van Clines M.D.   On: 04/24/2015 12:59   Dg Chest Port 1 View  04/24/2015   CLINICAL DATA:  Shortness of breath.  EXAM: PORTABLE CHEST 1 VIEW  COMPARISON:  March 10, 2015.  FINDINGS: The heart size and mediastinal contours are within normal limits. Both lungs are clear. No pneumothorax or pleural effusion is noted. The visualized skeletal structures are unremarkable.  IMPRESSION: No acute cardiopulmonary abnormality seen.   Electronically Signed   By: Marijo Conception, M.D.   On: 04/24/2015 12:22    CBC  Recent Labs Lab 04/24/15 1132 04/24/15 1146 04/25/15 0421  WBC 11.2*  --  9.4  HGB 14.9 16.3* 12.9  HCT 44.2 48.0* 38.3  PLT 576*  --  477*  MCV 90.8  --  90.3  MCH 30.6  --  30.4  MCHC 33.7  --  33.7  RDW  15.0  --  15.4  LYMPHSABS 5.5*  --   --   MONOABS 1.2*  --   --   EOSABS 0.1  --   --   BASOSABS 0.0  --   --     Chemistries   Recent Labs Lab 04/24/15 1132 04/24/15 1146 04/25/15 0421  NA 135 133* 133*  K 4.1 4.0 3.7  CL 97* 98* 102  CO2 25  --  25  GLUCOSE 213* 210* 92  BUN 41* 42* 36*  CREATININE 4.07* 4.20* 3.61*  CALCIUM 13.1*  --  10.5*  AST 30  --  24  ALT 16  --  12*  ALKPHOS 148*  --  109  BILITOT 0.5  --  0.5   ------------------------------------------------------------------------------------------------------------------ estimated creatinine clearance is 14.5 mL/min (by C-G formula based on Cr of 3.61). ------------------------------------------------------------------------------------------------------------------ No results for input(s): HGBA1C in the last 72 hours. ------------------------------------------------------------------------------------------------------------------ No results for input(s): CHOL, HDL, LDLCALC, TRIG, CHOLHDL, LDLDIRECT in the last 72 hours. ------------------------------------------------------------------------------------------------------------------  Recent Labs  04/24/15 1422  TSH 1.798   ------------------------------------------------------------------------------------------------------------------ No results for input(s): VITAMINB12, FOLATE, FERRITIN, TIBC, IRON, RETICCTPCT in the last 72 hours.  Coagulation profile  Recent Labs Lab 04/24/15 1132 04/25/15 0421  INR 2.45* 2.07*    No results for input(s): DDIMER in the last 72 hours.  Cardiac Enzymes No results for input(s): CKMB, TROPONINI, MYOGLOBIN in the last 168 hours.  Invalid input(s): CK ------------------------------------------------------------------------------------------------------------------ Invalid input(s): POCBNP    MIKHAIL, MARYANN D.O. on 04/25/2015 at 11:04 AM  Between 7am to 7pm - Pager - [phone]  After 7pm go to  [email or web address] - password [email or web address]  And look for the night coverage person covering for me after hours  [facility] Office  [phone]

## 2015-04-25 NOTE — Progress Notes (Addendum)
Inpatient [facility] RM 3E 28 [facility] RN Visit. GIP related admission to [facility] DX: Heart Disease, pt. Is a DNR code status. Pt. Admitted 04/24/15 with AMS.  Pt. Seen in room dozing with O2 on at 4L n/c. Pt. Awakened to her name. She appears oriented x 3. She reports ongoing weakness and abd. pain since May. No family at bedside. Pt. Declined any needs or questions at this time. [facility] will continue to follow daily. [facility] med list and transfer summary placed on shadow chart. Please call with any questions.  [facility] Liaison [phone]

## 2015-04-25 NOTE — Progress Notes (Signed)
Utilization review completed. Bertha Stanfill, RN, BSN. 

## 2015-04-25 NOTE — Progress Notes (Signed)
RM 3E28  [name]                                                    [facility] MSW Note:  Pt was not in the room when SW arrived. Pt is under hospice care. She lives alone and has been independently caring for herself, but family will visit or call often to check on her. Please call with any concerns or questions. Benedict Needy [place],LCSW

## 2015-04-25 NOTE — Progress Notes (Signed)
Initial Nutrition Assessment  DOCUMENTATION CODES:   Non-severe (moderate) malnutrition in context of chronic illness  INTERVENTION:  Provide Pro-stat BID, each dose provides 15 grams of protein and 100 kcal When diet is advanced, add snacks TID   NUTRITION DIAGNOSIS:   Inadequate oral intake related to poor appetite, altered GI function as evidenced by mild depletion of body fat, moderate depletions of muscle mass, percent weight loss.   GOAL:   Patient will meet greater than or equal to 90% of their needs   MONITOR:   Diet advancement, Supplement acceptance, PO intake, Labs, Weight trends, Skin, I & O's  REASON FOR ASSESSMENT:   Malnutrition Screening Tool    ASSESSMENT:   69 y.o. 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 Patient presents with altered mental status.   Pt reports that she was eating poorly PTA due to poor appetite and abdominal pain. She reports usual weight of 205 lbs and weight history shows >20% weight loss in the past 3 months.  Weight loss is severe for time frame however muscle wasting is only moderate and fat wasting mild per physical exam. Pt is currently on clear liquids and consumed some broth and jello for lunch. Pt dislikes Boost Breeze and Ensure supplements but, she is agreeable to trying Pro-stat and receiving snacks.   Labs: low sodium, elevated BUN/Creatinine, low GFR  Diet Order:  Diet clear liquid Room service appropriate?: Yes; Fluid consistency:: Thin  Skin:  Reviewed, no issues  Last BM:  9/16  Height:   Ht Readings from Last 1 Encounters:  04/02/15 5\' 3"  (1.6 m)    Weight:   Wt Readings from Last 1  Encounters:  04/25/15 161 lb 12.8 oz (73.392 kg)    Ideal Body Weight:  52.3 kg  BMI:  Body mass index is 28.67 kg/(m^2).  Estimated Nutritional Needs:   Kcal:  1800-2000  Protein:  85-95 grams  Fluid:  1.8-2 L/day  EDUCATION NEEDS:   No education needs identified at this time  [place], LDN Inpatient Clinical Dietitian Pager: [phone] After Hours Pager: [phone]

## 2015-04-26 DIAGNOSIS — K921 Melena: Secondary | ICD-10-CM

## 2015-04-26 DIAGNOSIS — E440 Moderate protein-calorie malnutrition: Secondary | ICD-10-CM

## 2015-04-26 DIAGNOSIS — R4182 Altered mental status, unspecified: Secondary | ICD-10-CM

## 2015-04-26 LAB — GLUCOSE, CAPILLARY
GLUCOSE-CAPILLARY: 89 mg/dL (ref 65–99)
GLUCOSE-CAPILLARY: 89 mg/dL (ref 65–99)
Glucose-Capillary: 154 mg/dL — ABNORMAL HIGH (ref 65–99)
Glucose-Capillary: 106 mg/dL — ABNORMAL HIGH (ref 65–99)

## 2015-04-26 LAB — CBC
HEMATOCRIT: 40.6 % (ref 36.0–46.0)
HEMOGLOBIN: 13.2 g/dL (ref 12.0–15.0)
MCH: 30.3 pg (ref 26.0–34.0)
MCHC: 32.5 g/dL (ref 30.0–36.0)
MCV: 93.1 fL (ref 78.0–100.0)
Platelets: 455 10*3/uL — ABNORMAL HIGH (ref 150–400)
RBC: 4.36 MIL/uL (ref 3.87–5.11)
RDW: 15.7 % — ABNORMAL HIGH (ref 11.5–15.5)
WBC: 11.1 10*3/uL — ABNORMAL HIGH (ref 4.0–10.5)

## 2015-04-26 LAB — PROTIME-INR
INR: 3.05 — ABNORMAL HIGH (ref 0.00–1.49)
Prothrombin Time: 31 seconds — ABNORMAL HIGH (ref 11.6–15.2)

## 2015-04-26 LAB — BASIC METABOLIC PANEL
Anion gap: 8 (ref 5–15)
BUN: 37 mg/dL — AB (ref 6–20)
CHLORIDE: 106 mmol/L (ref 101–111)
CO2: 23 mmol/L (ref 22–32)
CREATININE: 3.09 mg/dL — AB (ref 0.44–1.00)
Calcium: 9.6 mg/dL (ref 8.9–10.3)
GFR calc non Af Amer: 15 mL/min — ABNORMAL LOW (ref >60)
GFR, EST AFRICAN AMERICAN: 17 mL/min — AB (ref >60)
Glucose, Bld: 84 mg/dL (ref 65–99)
Potassium: 3.4 mmol/L — ABNORMAL LOW (ref 3.5–5.1)
Sodium: 137 mmol/L (ref 135–145)

## 2015-04-26 LAB — PROCALCITONIN: Procalcitonin: 0.44 ng/mL

## 2015-04-26 LAB — LACTIC ACID, PLASMA: Lactic Acid, Venous: 1.8 mmol/L (ref 0.5–2.0)

## 2015-04-26 MED ORDER — POTASSIUM CHLORIDE CRYS ER 20 MEQ PO TBCR
40.0000 meq | EXTENDED_RELEASE_TABLET | Freq: Once | ORAL | Status: AC
  Administered 2015-04-26: 40 meq via ORAL
  Filled 2015-04-26: qty 2

## 2015-04-26 NOTE — Progress Notes (Signed)
Inpatient [facility] RM 3E 28 [facility] [place] RN Visit-Stacie Delice Lesch RN, BSN  This is a related admission to [facility] DX: Heart Disease. Patient is a DNR. Patient seen in room resting in bed. No family at bedside. Patient complained of having multiple episodes of diarrhea overnight.  She stated "something doesn't feel right."  She c/o abdominal discomfort a 4/10.  Staff RN notified of request for pain medication.  Patient is currently on 2 L O2 [place] and respirations WNL.  Per chart review, patient has received 1 dose of Norco 5/325mg  in the past 24 hours. Offered emotional support.   [facility] will continue to follow daily.  Please call with any questions.  Annia Belt RN, [place] [facility] Liaison [phone]

## 2015-04-26 NOTE — Progress Notes (Signed)
Subjective: Patient reports not feeling well due to stomach pain and diarrhea.  No new neurological complaints.    Objective: Current vital signs: BP 145/80 mmHg  Pulse 71  Temp(Src) 97.7 F (36.5 C) (Oral)  Resp 18  Wt 74.889 kg (165 lb 1.6 oz)  SpO2 100% Vital signs in last 24 hours: Temp:  [97.6 F (36.4 C)-97.8 F (36.6 C)] 97.7 F (36.5 C) (09/24 1157) Pulse Rate:  [56-83] 71 (09/24 1157) Resp:  [16-18] 18 (09/24 1157) BP: (133-159)/(71-90) 145/80 mmHg (09/24 1157) SpO2:  [100 %] 100 % (09/24 1157) Weight:  [74.889 kg (165 lb 1.6 oz)] 74.889 kg (165 lb 1.6 oz) (09/24 0410)  Intake/Output from previous day: 09/23 0701 - 09/24 0700 In: 4942.5 [P.O.:1370; I.V.:3472.5; IV Piggyback:100] Out: 2050 [Urine:2050] Intake/Output this shift: Total I/O In: -  Out: 500 [Urine:500] Nutritional status: Diet clear liquid Room service appropriate?: Yes; Fluid consistency:: Thin  Neurologic Exam: Mental Status: Alert, oriented. Follows commands. Speech fluent.  Cranial Nerves: II: Discs flat bilaterally; Visual fields grossly normal, pupils equal, round, reactive to light and accommodation III,IV, VI: ptosis not present, extra-ocular motions intact bilaterally V,VII: smile symmetric, facial light touch sensation normal bilaterally VIII: hearing normal bilaterally IX,X: uvula rises symmetrically XI: bilateral shoulder shrug XII: midline tongue extension Motor: Moving all extremities antigravity Sensory: Light sensation intact in all extremities.  Deep Tendon Reflexes: 2+ and symmetric throughout Plantars: Right: downgoingLeft: downgoing  Lab Results: Basic Metabolic Panel:  Recent Labs Lab 04/24/15 1132 04/24/15 1146 04/25/15 0421 04/26/15 0338  NA 135 133* 133* 137  K 4.1 4.0 3.7 3.4*  CL 97* 98* 102 106  CO2 25  --  25 23  GLUCOSE 213* 210* 92 84  BUN 41* 42* 36* 37*  CREATININE 4.07* 4.20* 3.61* 3.09*  CALCIUM 13.1*  --   10.5* 9.6    Liver Function Tests:  Recent Labs Lab 04/24/15 1132 04/25/15 0421  AST 30 24  ALT 16 12*  ALKPHOS 148* 109  BILITOT 0.5 0.5  PROT 6.3* 4.9*  ALBUMIN 3.0* 2.2*   No results for input(s): LIPASE, AMYLASE in the last 168 hours.  Recent Labs Lab 04/24/15 1422  AMMONIA 33    CBC:  Recent Labs Lab 04/24/15 1132 04/24/15 1146 04/25/15 0421 04/26/15 0338  WBC 11.2*  --  9.4 11.1*  NEUTROABS 4.4  --   --   --   HGB 14.9 16.3* 12.9 13.2  HCT 44.2 48.0* 38.3 40.6  MCV 90.8  --  90.3 93.1  PLT 576*  --  477* 455*    Cardiac Enzymes: No results for input(s): CKTOTAL, CKMB, CKMBINDEX, TROPONINI in the last 168 hours.  Lipid Panel: No results for input(s): CHOL, TRIG, HDL, CHOLHDL, VLDL, LDLCALC in the last 168 hours.  CBG:  Recent Labs Lab 04/25/15 1230 04/25/15 1737 04/25/15 2335 04/26/15 0626 04/26/15 1202  GLUCAP 153* 126* 116* 30 154*    Microbiology: Results for orders placed or performed during the hospital encounter of 04/02/15  Urine culture     Status: None   Collection Time: 04/02/15  3:30 PM  Result Value Ref Range Status   Specimen Description URINE, CLEAN CATCH  Final   Special Requests NONE  Final   Culture   Final    MULTIPLE SPECIES PRESENT, SUGGEST RECOLLECTION Performed at [facility]    Report Status 04/04/2015 FINAL  Final    Coagulation Studies:  Recent Labs  04/24/15 1132 04/25/15 0421 04/26/15 0338  LABPROT 26.3*  23.2* 31.0*  INR 2.45* 2.07* 3.05*    Imaging: Mr Brain Wo Contrast  04/25/2015   CLINICAL DATA:  Altered mental status.  Aphasia.  EXAM: MRI HEAD WITHOUT CONTRAST  TECHNIQUE: Multiplanar, multiecho pulse sequences of the brain and surrounding structures were obtained without intravenous contrast.  COMPARISON:  Head CT 04/24/2015 and MRI 04/12/2006  FINDINGS: 1 cm mildly prominent focus of CSF above the pituitary gland is unchanged. There is no evidence of acute infarct, intracranial  hemorrhage, mass, midline shift, or extra-axial fluid collection. There is mild generalized cerebral atrophy, slightly progressed from the prior MRI but within normal limits for age. There are several small foci of T2 hyperintensity in the white matter of both cerebral hemispheres as well as in the pons, nonspecific but may reflect minimal chronic small vessel ischemic disease.  Prior bilateral cataract extraction is noted. A chronic, small to moderate right mastoid effusion is noted. Major intracranial vascular flow voids are preserved. Advanced multilevel disc degeneration is partially visualized in the cervical spine.  IMPRESSION: 1. No acute intracranial abnormality. 2. Minimal chronic small vessel ischemic disease. 3. Chronic right mastoid effusion.   Electronically Signed   By: Logan Bores M.D.   On: 04/25/2015 11:37    Medications:  I have reviewed the patient's current medications. Scheduled: . Apremilast  30 mg Oral Daily  . atorvastatin  20 mg Oral QHS  . carvedilol  3.125 mg Oral BID WC  . dicyclomine  10 mg Oral TID AC & HS  . famotidine  20 mg Oral Daily  . feeding supplement (PRO-STAT SUGAR FREE 64)  30 mL Oral BID  . furosemide  80 mg Oral BID  . insulin aspart  0-15 Units Subcutaneous Q6H  . insulin glargine  20 Units Subcutaneous Daily  . mometasone-formoterol  2 puff Inhalation BID  . pantoprazole  80 mg Oral Q1200  . saccharomyces boulardii  250 mg Oral BID  . warfarin  1.5 mg Oral QPM    Assessment/Plan: No further neurological complaints.  MRI of the brain reviewed and shows no acute changes.  Patient remains on Coumadin with an INR of 3.05.  Recommendations: 1.  No further neurologic intervention is recommended at this time.  If further questions arise, please call or page at that time.  Thank you for allowing neurology to participate in the care of this patient.  Patient to remain on Coumadin.  Alexis Goodell, MD [facility] [phone] 04/26/2015   1:12 PM    LOS: 2 days

## 2015-04-26 NOTE — Progress Notes (Signed)
[facility]                                                                              Patient Demographics  [name], is a 69 y.o. female, DOB - 10-Dec-1945, EHM:[id number]  Admit date - 04/24/2015   Admitting Physician Truett Mainland, DO  Outpatient Primary MD for the patient is FULP, CAMMIE, MD  LOS - 2   Chief Complaint  Patient presents with  . Aphasia  . Abdominal Pain      HPI on 04/24/2015 by Dr. Loma Boston [name] is a 69 y.o. 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 Patient presents with altered mental status. He was brought to the emergency room by her brother who gives a history as the patient is not following oral directions. The patient was first noted to be abnormal by her brother when he picked her up from her house. She walked past without noticing him. The patient was taken to her urologist's office, who became concerned and sent her to the emergency department for workup. No palliating or provoking factors. Patient has remains relatively encephalopathic.  Assessment & Plan   Acute encephalopathy -Resolved, upon admission, patient was noted to have some question of aphasia/dysarthria -CT of the head: No acute intracranial findings -Neurology consulted and appreciated -MRI brain: No acute intracranial abnormality -UA/CXR unremarkable  Hypercalcemia -Unknown etiology -Ca improving, 9.6  -Vitamin D 21.5, PTH 13 (start Vit D supplementation)  Metabolic alkalosis/ lactic acidosis -lactic acid improved, 1.8 -Started on vanc/zosyn empirically, will discontinue  Acute on chronic kidney disease, stage IV -Creatinine  improving, currently 3.09 -Follows with Dr. Joelyn Oms  Chronic systolic heart failure -Does not appear to be volume overloaded at this time, currently compensated -Echocardiogram shows EF of 30-35% -Continue coreg, lasix, statin  Diabetes mellitus, type II -Continue Lantus, ISS, with CBG monitoring  Hematochezia -Per RN, patient had large amount of blood with bowel movment -Will continue to monitor CBC -Patient had EGD on 03/14/2015: normal appearing Esophagus and GE junction -Colonoscopy on 03/14/2015 showed for proximal sigmoid polyps, mild sigmoid diverticulosis otherwise normal colon and rectum- biopsy were normal. Polyps were not removed due to anticoagulation.  Coronary artery disease -Suspected ischemic cardiomyopathy -Patient is on Coumadin not aspirin -Currently chest pain-free -Continue coreg, statin  Apical thrombus -Continue Coumadin per pharmacy (INR 3.05)  Chronic abdominal pain/GERD -Continue bentyl, PPI, pepcid  COPD  -Continue 2L O2, dulera, albuterol PRN  Deconditioning -PT and OT consulted, pending  Nonsevere Malnutrition -Nutrition consulted, continue feeding supplements  Code Status: DNR  Family Communication: None at bedside  Disposition Plan: Admitted. Pending workup  Time Spent in minutes   30 minutes  Procedures  None  Consults   Neurology  DVT Prophylaxis  Coumadin  Lab Results  Component Value Date   PLT 455* 04/26/2015    Medications  Scheduled Meds: . Apremilast  30 mg Oral Daily  . atorvastatin  20 mg Oral QHS  . carvedilol  3.125 mg Oral BID WC  . dicyclomine  10 mg Oral TID AC & HS  . famotidine  20 mg Oral Daily  . feeding supplement (PRO-STAT SUGAR FREE 64)  30 mL Oral BID  . furosemide  80 mg Oral BID  . insulin aspart  0-15 Units Subcutaneous Q6H  . insulin glargine  20 Units Subcutaneous Daily  . mometasone-formoterol  2 puff Inhalation BID  . pantoprazole  80 mg Oral Q1200  . piperacillin-tazobactam (ZOSYN)   IV  2.25 g Intravenous Q6H  . potassium chloride  40 mEq Oral Once  . saccharomyces boulardii  250 mg Oral BID  . vancomycin  1,000 mg Intravenous Q48H  . warfarin  1.5 mg Oral QPM  . Warfarin - Physician Dosing Inpatient   Does not apply q1800   Continuous Infusions:   PRN Meds:.acetaminophen **OR** acetaminophen, albuterol, alum & mag hydroxide-simeth, fluticasone, HYDROcodone-acetaminophen, ondansetron **OR** ondansetron (ZOFRAN) IV, promethazine  Antibiotics    Anti-infectives    Start     Dose/Rate Route Frequency Ordered Stop   04/26/15 1500  vancomycin (VANCOCIN) IVPB 1000 mg/200 mL premix     1,000 mg 200 mL/hr over 60 Minutes Intravenous Every 48 hours 04/24/15 1412     04/24/15 2030  piperacillin-tazobactam (ZOSYN) IVPB 2.25 g     2.25 g 100 mL/hr over 30 Minutes Intravenous Every 6 hours 04/24/15 1412     04/24/15 1415  piperacillin-tazobactam (ZOSYN) IVPB 3.375 g     3.375 g 100 mL/hr over 30 Minutes Intravenous  Once 04/24/15 1400 04/24/15 1459   04/24/15 1415  vancomycin (VANCOCIN) 1,500 mg in sodium chloride 0.9 % 500 mL IVPB     1,500 mg 250 mL/hr over 120 Minutes Intravenous STAT 04/24/15 1400 04/24/15 1659      Subjective:   [name] seen and examined today.  Patient complains of abdominal pain and diarrhea. She also complains of weakness and decreased appetite. She denies chest pain or shortness of breath, nausea or vomiting.    Objective:   Filed Vitals:   04/25/15 2028 04/26/15 0410 04/26/15 0827 04/26/15 0946  BP:  159/87 156/79   Pulse:  71 83 62  Temp:  97.8 F (36.6 C)    TempSrc:  Oral    Resp:  18  18  Weight:  74.889 kg (165 lb 1.6 oz)    SpO2: 100% 100%  100%    Wt Readings from Last 3 Encounters:  04/26/15 74.889 kg (165 lb 1.6 oz)  04/24/15 72.576 kg (160 lb)  04/02/15 69.4 kg (153 lb)     Intake/Output Summary (Last 24 hours) at 04/26/15 1035 Last data filed at 04/26/15 0600  Gross per 24 hour  Intake 4617.5 ml  Output    2050 ml  Net 2567.5 ml    Exam  General: Well developed, well nourished, mild distress due to pain  HEENT: NCAT,mucous membranes moist.   Cardiovascular: S1 S2 auscultated, RRR  Respiratory: Clear to auscultation   Abdomen: Soft, mildly TTP, nondistended, + bowel sounds  Extremities: warm dry without cyanosis clubbing or edema  Neuro: AAOx3, nonfocal, no aphasia  Psych: Flat affect  Data Review   Micro Results No results found for this or any previous visit (from the past 240 hour(s)).  Radiology Reports Ct Head Wo Contrast  04/24/2015   CLINICAL DATA:  Altered mental status.  Unable to speak.  Aphasia.  EXAM: CT HEAD WITHOUT CONTRAST  TECHNIQUE: Contiguous  axial images were obtained from the base of the skull through the vertex without intravenous contrast.  COMPARISON:  03/31/2012  FINDINGS: The brainstem, cerebellum, cerebral peduncles, thalamus, basal ganglia, basilar cisterns, and ventricular system appear within normal limits. No intracranial hemorrhage, mass lesion, or acute CVA.  Right mastoid effusion. No fluid in the middle ear. Globes extend 2.4 cm anterior to the interzygomatic line. Prominent orbital adipose tissue with exophthalmos.  IMPRESSION: 1. No acute intracranial findings. 2. 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 3. Right mastoid effusion.   Electronically Signed   By: Van Clines M.D.   On: 04/24/2015 12:59   Mr Brain Wo Contrast  04/25/2015   CLINICAL DATA:  Altered mental status.  Aphasia.  EXAM: MRI HEAD WITHOUT CONTRAST  TECHNIQUE: Multiplanar, multiecho pulse sequences of the brain and surrounding structures were obtained without intravenous contrast.  COMPARISON:  Head CT 04/24/2015 and MRI 04/12/2006  FINDINGS: 1 cm mildly prominent focus of CSF above the pituitary gland is unchanged. There is no evidence of acute infarct, intracranial hemorrhage, mass, midline shift, or extra-axial fluid collection.  There is mild generalized cerebral atrophy, slightly progressed from the prior MRI but within normal limits for age. There are several small foci of T2 hyperintensity in the white matter of both cerebral hemispheres as well as in the pons, nonspecific but may reflect minimal chronic small vessel ischemic disease.  Prior bilateral cataract extraction is noted. A chronic, small to moderate right mastoid effusion is noted. Major intracranial vascular flow voids are preserved. Advanced multilevel disc degeneration is partially visualized in the cervical spine.  IMPRESSION: 1. No acute intracranial abnormality. 2. Minimal chronic small vessel ischemic disease. 3. Chronic right mastoid effusion.   Electronically Signed   By: Logan Bores M.D.   On: 04/25/2015 11:37   Dg Chest Port 1 View  04/24/2015   CLINICAL DATA:  Shortness of breath.  EXAM: PORTABLE CHEST 1 VIEW  COMPARISON:  March 10, 2015.  FINDINGS: The heart size and mediastinal contours are within normal limits. Both lungs are clear. No pneumothorax or pleural effusion is noted. The visualized skeletal structures are unremarkable.  IMPRESSION: No acute cardiopulmonary abnormality seen.   Electronically Signed   By: Marijo Conception, M.D.   On: 04/24/2015 12:22    CBC  Recent Labs Lab 04/24/15 1132 04/24/15 1146 04/25/15 0421 04/26/15 0338  WBC 11.2*  --  9.4 11.1*  HGB 14.9 16.3* 12.9 13.2  HCT 44.2 48.0* 38.3 40.6  PLT 576*  --  477* 455*  MCV 90.8  --  90.3 93.1  MCH 30.6  --  30.4 30.3  MCHC 33.7  --  33.7 32.5  RDW 15.0  --  15.4 15.7*  LYMPHSABS 5.5*  --   --   --   MONOABS 1.2*  --   --   --   EOSABS 0.1  --   --   --   BASOSABS 0.0  --   --   --     Chemistries   Recent Labs Lab 04/24/15 1132 04/24/15 1146 04/25/15 0421 04/26/15 0338  NA 135 133* 133* 137  K 4.1 4.0 3.7 3.4*  CL 97* 98* 102 106  CO2 25  --  25 23  GLUCOSE 213* 210* 92 84  BUN 41* 42* 36* 37*  CREATININE 4.07* 4.20* 3.61* 3.09*  CALCIUM 13.1*  --   10.5* 9.6  AST 30  --  24  --  ALT 16  --  12*  --   ALKPHOS 148*  --  109  --   BILITOT 0.5  --  0.5  --    ------------------------------------------------------------------------------------------------------------------ estimated creatinine clearance is 17.1 mL/min (by C-G formula based on Cr of 3.09). ------------------------------------------------------------------------------------------------------------------ No results for input(s): HGBA1C in the last 72 hours. ------------------------------------------------------------------------------------------------------------------ No results for input(s): CHOL, HDL, LDLCALC, TRIG, CHOLHDL, LDLDIRECT in the last 72 hours. ------------------------------------------------------------------------------------------------------------------  Recent Labs  04/24/15 1422  TSH 1.798   ------------------------------------------------------------------------------------------------------------------ No results for input(s): VITAMINB12, FOLATE, FERRITIN, TIBC, IRON, RETICCTPCT in the last 72 hours.  Coagulation profile  Recent Labs Lab 04/24/15 1132 04/25/15 0421 04/26/15 0338  INR 2.45* 2.07* 3.05*    No results for input(s): DDIMER in the last 72 hours.  Cardiac Enzymes No results for input(s): CKMB, TROPONINI, MYOGLOBIN in the last 168 hours.  Invalid input(s): CK ------------------------------------------------------------------------------------------------------------------ Invalid input(s): POCBNP    MIKHAIL, MARYANN D.O. on 04/26/2015 at 10:35 AM  Between 7am to 7pm - Pager - [phone]  After 7pm go to [email or web address] - password [email or web address]  And look for the night coverage person covering for me after hours  [facility] Office  [phone]

## 2015-04-26 NOTE — Progress Notes (Signed)
ANTICOAGULATION CONSULT NOTE - Follow Up Consult  Pharmacy Consult for Coumadin Indication: LV thrombus  Allergies  Allergen Reactions  . Adhesive [Tape] Other (See Comments)    Blisters   . Avelox [Moxifloxacin Hcl In Nacl] Other (See Comments)    GI upset  . Codeine Other (See Comments)    "crazy"   . Guaifenesin Nausea And Vomiting    Takes Mucinex at home without issue  . Latex Swelling  . Oxycodone Nausea And Vomiting    Takes Percocet at home without issue    Patient Measurements: Weight: 165 lb 1.6 oz (74.889 kg) Heparin Dosing Weight:    Vital Signs: Temp: 97.7 F (36.5 C) (09/24 1031) Temp Source: Axillary (09/24 1031) BP: 133/90 mmHg (09/24 1031) Pulse Rate: 60 (09/24 1031)  Labs:  Recent Labs  04/24/15 1132 04/24/15 1146 04/25/15 0421 04/26/15 0338  HGB 14.9 16.3* 12.9 13.2  HCT 44.2 48.0* 38.3 40.6  PLT 576*  --  477* 455*  APTT 36  --   --   --   LABPROT 26.3*  --  23.2* 31.0*  INR 2.45*  --  2.07* 3.05*  CREATININE 4.07* 4.20* 3.61* 3.09*    Estimated Creatinine Clearance: 17.1 mL/min (by C-G formula based on Cr of 3.09).   Assessment:  Anticoag: Warfarin PTA for mural thrombus - INR 2.07 on coumadin 1.5mg  qday. INR today 3.05. CBC WNL  Goal of Therapy:  INR 2-3 Monitor platelets by anticoagulation protocol: Yes   Plan:  Continue Coumadin 1.5mg  daily Daily INR. Abx d/c'd   Crystal S. Alford Highland, PharmD, BCPS Clinical Staff Pharmacist Pager [phone]  [name] 04/26/2015,11:24 AM

## 2015-04-26 NOTE — Evaluation (Signed)
Physical Therapy Evaluation Patient Details Name: [name] MRN: [id number] DOB: [birth year] Today's Date: 04/26/2015   History of Present Illness  Pt. admitted 04/24/15 with AMS, encephalopathy, hypercalcemia, lactic acidosis  metabolic acidosis, acute renal failure on CKD IV, .  Pt. 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  Pt is being followed by Hospice at home due to heart failure  Clinical Impression  Pt. Presents to PT with above diagnoses and below functional limitations which require acute PT intervention for remediation.  Pt. Appears acutely ill with SOB with exertion and limited activity tolerance.  Pt. Also incontinent of loose stool.  I do not anticipate pt. Will be able to return home alone and family has stated they cannot provide 24 hour care.  Pt. Will need in house rehabilitation, and most likely venue will be SNF.      Follow Up Recommendations SNF;Supervision/Assistance - 24 hour;Other (comment) (unless pt. becomes appropriate for residential hospice)    Equipment Recommendations  None recommended by PT    Recommendations for Other Services       Precautions / Restrictions Precautions Precautions: Fall Restrictions Weight Bearing Restrictions: No      Mobility  Bed Mobility Overal bed mobility: Modified Independent             General bed mobility comments: Pt. uses bed rail to assist self in supine to sit and in scooting up to [facility] Overall transfer level: Needs assistance Equipment used: 1 person hand held assist Transfers: Sit to/from [facility] Sit to Stand: Min assist Stand pivot transfers: Min assist       General transfer comment: Pt. able to power up, needs min assist for stability/balance.  Pt. with bowel incontinence before and during transfer to [facility].  Stool was all liquid  Ambulation/Gait Ambulation/Gait assistance:  (pt. unable ; too weak  to tolerate)              Stairs            Wheelchair Mobility    Modified Rankin (Stroke Patients Only)       Balance Overall balance assessment: Needs assistance Sitting-balance support: No upper extremity supported;Feet supported Sitting balance-Leahy Scale: Fair     Standing balance support: Bilateral upper extremity supported;During functional activity Standing balance-Leahy Scale: Poor Standing balance comment: needs external support for standing upright                             Pertinent Vitals/Pain Pain Assessment: No/denies pain  Pt. With stable HR of 70s throughout session.  Dyspnea 1/4 at rest, 3/4 with exertion of transfer to [facility] and back to bed    Home Living Family/patient expects to be discharged to:: Skilled nursing facility                 Additional Comments: Pt's 2 sisters state pt. will need SNF if she is not able to return home alone    Prior Function Level of Independence: Independent with assistive device(s)         Comments: Using 4 wheeled RW PTA, was haveing in home visits from [place], aide and SW     Hand Dominance        Extremity/Trunk Assessment   Upper Extremity Assessment: Generalized weakness           Lower Extremity Assessment: Generalized weakness  Cervical / Trunk Assessment: Normal  Communication   Communication: No difficulties  Cognition Arousal/Alertness: Awake/alert Behavior During Therapy: WFL for tasks assessed/performed Overall Cognitive Status: Within Functional Limits for tasks assessed                      General Comments      Exercises        Assessment/Plan    PT Assessment Patient needs continued PT services  PT Diagnosis Difficulty walking;Generalized weakness   PT Problem List Decreased strength;Decreased activity tolerance;Decreased balance;Decreased coordination;Decreased knowledge of use of DME  PT Treatment Interventions DME  instruction;Gait training;Functional mobility training;Therapeutic exercise;Balance training;Therapeutic activities;Patient/family education   PT Goals (Current goals can be found in the Care Plan section) Acute Rehab PT Goals Patient Stated Goal: pt. did not state; sisters wanting pt. to receive care at SNF if she can't take care of herself PT Goal Formulation: With patient Time For Goal Achievement: 04/26/15 Potential to Achieve Goals: Fair    Frequency Min 3X/week   Barriers to discharge Decreased caregiver support      Co-evaluation               End of Session Equipment Utilized During Treatment: Gait belt Activity Tolerance: Patient limited by fatigue Patient left: in bed;with call bell/phone within reach;with family/visitor present Nurse Communication: Mobility status;Other (comment) (liquid stool, incontinence)         Time: 4401-0272 PT Time Calculation (min) (ACUTE ONLY): 36 min   Charges:   PT Evaluation $Initial PT Evaluation Tier I: 1 Procedure PT Treatments $Therapeutic Activity: 8-22 mins   PT G Codes:        Ladona Ridgel 04/26/2015, 3:59 PM Gerlean Ren PT Acute Rehab Services [phone] [place] [phone]

## 2015-04-27 LAB — CBC
HCT: 40.7 % (ref 36.0–46.0)
Hemoglobin: 13.1 g/dL (ref 12.0–15.0)
MCH: 30.2 pg (ref 26.0–34.0)
MCHC: 32.2 g/dL (ref 30.0–36.0)
MCV: 93.8 fL (ref 78.0–100.0)
PLATELETS: 442 10*3/uL — AB (ref 150–400)
RBC: 4.34 MIL/uL (ref 3.87–5.11)
RDW: 15.1 % (ref 11.5–15.5)
WBC: 10.6 10*3/uL — AB (ref 4.0–10.5)

## 2015-04-27 LAB — GLUCOSE, CAPILLARY
GLUCOSE-CAPILLARY: 78 mg/dL (ref 65–99)
GLUCOSE-CAPILLARY: 126 mg/dL — AB (ref 65–99)
GLUCOSE-CAPILLARY: 214 mg/dL — AB (ref 65–99)
Glucose-Capillary: 100 mg/dL — ABNORMAL HIGH (ref 65–99)
Glucose-Capillary: 123 mg/dL — ABNORMAL HIGH (ref 65–99)

## 2015-04-27 LAB — BASIC METABOLIC PANEL
ANION GAP: 8 (ref 5–15)
BUN: 39 mg/dL — ABNORMAL HIGH (ref 6–20)
CO2: 23 mmol/L (ref 22–32)
Calcium: 10.2 mg/dL (ref 8.9–10.3)
Chloride: 105 mmol/L (ref 101–111)
Creatinine, Ser: 2.95 mg/dL — ABNORMAL HIGH (ref 0.44–1.00)
GFR, EST AFRICAN AMERICAN: 18 mL/min — AB (ref >60)
GFR, EST NON AFRICAN AMERICAN: 15 mL/min — AB (ref >60)
Glucose, Bld: 79 mg/dL (ref 65–99)
POTASSIUM: 3.1 mmol/L — AB (ref 3.5–5.1)
SODIUM: 136 mmol/L (ref 135–145)

## 2015-04-27 LAB — PROTIME-INR
INR: 3.08 — ABNORMAL HIGH (ref 0.00–1.49)
PROTHROMBIN TIME: 31.2 s — AB (ref 11.6–15.2)

## 2015-04-27 MED ORDER — ALPRAZOLAM 0.25 MG PO TABS
0.2500 mg | ORAL_TABLET | Freq: Once | ORAL | Status: AC
  Administered 2015-04-27: 0.25 mg via ORAL
  Filled 2015-04-27: qty 1

## 2015-04-27 MED ORDER — POTASSIUM CHLORIDE CRYS ER 20 MEQ PO TBCR
40.0000 meq | EXTENDED_RELEASE_TABLET | Freq: Two times a day (BID) | ORAL | Status: AC
  Administered 2015-04-27 (×2): 40 meq via ORAL
  Filled 2015-04-27 (×2): qty 2

## 2015-04-27 MED ORDER — CARVEDILOL 3.125 MG PO TABS
3.1250 mg | ORAL_TABLET | Freq: Two times a day (BID) | ORAL | Status: DC
  Administered 2015-04-27 – 2015-04-29 (×5): 3.125 mg via ORAL
  Filled 2015-04-27 (×5): qty 1

## 2015-04-27 NOTE — Progress Notes (Signed)
[facility]                                                                              Patient Demographics  [name], is a 69 y.o. female, DOB - 1946/05/02, WPV:[id number]  Admit date - 04/24/2015   Admitting Physician Truett Mainland, DO  Outpatient Primary MD for the patient is FULP, CAMMIE, MD  LOS - 3   Chief Complaint  Patient presents with  . Aphasia  . Abdominal Pain      HPI on 04/24/2015 by Dr. Loma Boston [name] is a 68 y.o. 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 Patient presents with altered mental status. He was brought to the emergency room by her brother who gives a history as the patient is not following oral directions. The patient was first noted to be abnormal by her brother when he picked her up from her house. She walked past without noticing him. The patient was taken to her urologist's office, who became concerned and sent her to the emergency department for workup. No palliating or provoking factors. Patient has remains relatively encephalopathic.  Assessment & Plan   Acute encephalopathy -Resolved, upon admission, patient was noted to have some question of aphasia/dysarthria -CT of the head: No acute intracranial findings -Neurology consulted and appreciated -MRI brain: No acute intracranial abnormality -UA/CXR unremarkable  Hypercalcemia -Unknown etiology -Ca improving, 10.2  -Vitamin D 21.5, PTH 13 (start Vit D supplementation)  Metabolic alkalosis/ lactic acidosis -lactic acid improved, 1.8 (from 3.7) -Started on vanc/zosyn empirically, discontinued  Acute on chronic kidney disease, stage  IV -Creatinine improving, currently 2.95 -Follows with Dr. Joelyn Oms  Chronic systolic heart failure -Does not appear to be volume overloaded at this time, currently compensated -Echocardiogram shows EF of 30-35% -Continue coreg, lasix, statin  Diabetes mellitus, type II -Continue Lantus, ISS, with CBG monitoring  Hematochezia -No further blood noted, more diarrhea -Hemoglobin stable, 13.1 -Will continue to monitor CBC -Patient had EGD on 03/14/2015: normal appearing Esophagus and GE junction -Colonoscopy on 03/14/2015 showed for proximal sigmoid polyps, mild sigmoid diverticulosis otherwise normal colon and rectum- biopsy were normal. Polyps were not removed due to anticoagulation.  Coronary artery disease -Suspected ischemic cardiomyopathy -Patient is on Coumadin not aspirin -Currently chest pain-free -Continue coreg, statin  Apical thrombus -Continue Coumadin per pharmacy (INR 3.08)  Chronic abdominal pain/GERD -Continue bentyl, PPI, pepcid -Will advance diet to soft, may possibly tolerate more than clears  COPD  -Continue 2L O2, dulera, albuterol PRN  Deconditioning -PT evaluated patient and recommended SNF -Pending OT  N[place]vere Malnutrition -Nutrition consulted, continue feeding supplements  Code Status: DNR  Family Communication: None at bedside. Sister via phone  Disposition Plan: Admitted. Will consult SW for SNF placement.  Time Spent in minutes   30 minutes  Procedures  None  Consults   Neurology  DVT Prophylaxis  Coumadin  Lab Results  Component Value Date   PLT  442* 04/27/2015    Medications  Scheduled Meds: . atorvastatin  20 mg Oral QHS  . carvedilol  3.125 mg Oral BID WC  . dicyclomine  10 mg Oral TID AC & HS  . famotidine  20 mg Oral Daily  . feeding supplement (PRO-STAT SUGAR FREE 64)  30 mL Oral BID  . furosemide  80 mg Oral BID  . insulin aspart  0-15 Units Subcutaneous Q6H  . insulin glargine  20 Units Subcutaneous Daily  .  mometasone-formoterol  2 puff Inhalation BID  . pantoprazole  80 mg Oral Q1200  . potassium chloride  40 mEq Oral BID  . saccharomyces boulardii  250 mg Oral BID  . warfarin  1.5 mg Oral QPM   Continuous Infusions:   PRN Meds:.acetaminophen **OR** acetaminophen, albuterol, alum & mag hydroxide-simeth, fluticasone, HYDROcodone-acetaminophen, ondansetron **OR** ondansetron (ZOFRAN) IV, promethazine  Antibiotics    Anti-infectives    Start     Dose/Rate Route Frequency Ordered Stop   04/26/15 1500  vancomycin (VANCOCIN) IVPB 1000 mg/200 mL premix  Status:  Discontinued     1,000 mg 200 mL/hr over 60 Minutes Intravenous Every 48 hours 04/24/15 1412 04/26/15 1046   04/24/15 2030  piperacillin-tazobactam (ZOSYN) IVPB 2.25 g  Status:  Discontinued     2.25 g 100 mL/hr over 30 Minutes Intravenous Every 6 hours 04/24/15 1412 04/26/15 1046   04/24/15 1415  piperacillin-tazobactam (ZOSYN) IVPB 3.375 g     3.375 g 100 mL/hr over 30 Minutes Intravenous  Once 04/24/15 1400 04/24/15 1459   04/24/15 1415  vancomycin (VANCOCIN) 1,500 mg in sodium chloride 0.9 % 500 mL IVPB     1,500 mg 250 mL/hr over 120 Minutes Intravenous STAT 04/24/15 1400 04/24/15 1659      Subjective:   [place] seen and examined today.  Patient complains of abdominal pain and diarrhea, but is drinking broth this morning. She denies chest pain or shortness of breath, nausea or vomiting.    Objective:   Filed Vitals:   04/26/15 2019 04/27/15 0426 04/27/15 0850 04/27/15 0922  BP: 155/93 148/91  134/54  Pulse: 73 86  73  Temp: 97.6 F (36.4 C) 97.6 F (36.4 C)  97.7 F (36.5 C)  TempSrc: Oral Oral    Resp: 18 18  16   Weight:  72.666 kg (160 lb 3.2 oz)    SpO2: 100% 100% 100% 100%    Wt Readings from Last 3 Encounters:  04/27/15 72.666 kg (160 lb 3.2 oz)  04/24/15 72.576 kg (160 lb)  04/02/15 69.4 kg (153 lb)     Intake/Output Summary (Last 24 hours) at 04/27/15 1028 Last data filed at 04/27/15 0600   Gross per 24 hour  Intake    940 ml  Output   2025 ml  Net  -1085 ml    Exam  General: Well developed, well nourished, NAD  HEENT: NCAT,mucous membranes moist.   Cardiovascular: S1 S2 auscultated, RRR  Respiratory: Clear to auscultation   Abdomen: Soft, mildly TTP, nondistended, + bowel sounds  Extremities: warm dry without cyanosis clubbing or edema  Neuro: AAOx3, nonfocal  Psych: Flat affect  Data Review   Micro Results No results found for this or any previous visit (from the past 240 hour(s)).  Radiology Reports Ct Head Wo Contrast  04/24/2015   CLINICAL DATA:  Altered mental status.  Unable to speak.  Aphasia.  EXAM: CT HEAD WITHOUT CONTRAST  TECHNIQUE: Contiguous axial images were obtained from the base of the skull  through the vertex without intravenous contrast.  COMPARISON:  03/31/2012  FINDINGS: The brainstem, cerebellum, cerebral peduncles, thalamus, basal ganglia, basilar cisterns, and ventricular system appear within normal limits. No intracranial hemorrhage, mass lesion, or acute CVA.  Right mastoid effusion. No fluid in the middle ear. Globes extend 2.4 cm anterior to the interzygomatic line. Prominent orbital adipose tissue with exophthalmos.  IMPRESSION: 1. No acute intracranial findings. 2. 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 3. Right mastoid effusion.   Electronically Signed   By: Van Clines M.D.   On: 04/24/2015 12:59   Mr Brain Wo Contrast  04/25/2015   CLINICAL DATA:  Altered mental status.  Aphasia.  EXAM: MRI HEAD WITHOUT CONTRAST  TECHNIQUE: Multiplanar, multiecho pulse sequences of the brain and surrounding structures were obtained without intravenous contrast.  COMPARISON:  Head CT 04/24/2015 and MRI 04/12/2006  FINDINGS: 1 cm mildly prominent focus of CSF above the pituitary gland is unchanged. There is no evidence of acute infarct, intracranial hemorrhage, mass, midline shift, or extra-axial fluid  collection. There is mild generalized cerebral atrophy, slightly progressed from the prior MRI but within normal limits for age. There are several small foci of T2 hyperintensity in the white matter of both cerebral hemispheres as well as in the pons, nonspecific but may reflect minimal chronic small vessel ischemic disease.  Prior bilateral cataract extraction is noted. A chronic, small to moderate right mastoid effusion is noted. Major intracranial vascular flow voids are preserved. Advanced multilevel disc degeneration is partially visualized in the cervical spine.  IMPRESSION: 1. No acute intracranial abnormality. 2. Minimal chronic small vessel ischemic disease. 3. Chronic right mastoid effusion.   Electronically Signed   By: Logan Bores M.D.   On: 04/25/2015 11:37   Dg Chest Port 1 View  04/24/2015   CLINICAL DATA:  Shortness of breath.  EXAM: PORTABLE CHEST 1 VIEW  COMPARISON:  March 10, 2015.  FINDINGS: The heart size and mediastinal contours are within normal limits. Both lungs are clear. No pneumothorax or pleural effusion is noted. The visualized skeletal structures are unremarkable.  IMPRESSION: No acute cardiopulmonary abnormality seen.   Electronically Signed   By: Marijo Conception, M.D.   On: 04/24/2015 12:22    CBC  Recent Labs Lab 04/24/15 1132 04/24/15 1146 04/25/15 0421 04/26/15 0338 04/27/15 0333  WBC 11.2*  --  9.4 11.1* 10.6*  HGB 14.9 16.3* 12.9 13.2 13.1  HCT 44.2 48.0* 38.3 40.6 40.7  PLT 576*  --  477* 455* 442*  MCV 90.8  --  90.3 93.1 93.8  MCH 30.6  --  30.4 30.3 30.2  MCHC 33.7  --  33.7 32.5 32.2  RDW 15.0  --  15.4 15.7* 15.1  LYMPHSABS 5.5*  --   --   --   --   MONOABS 1.2*  --   --   --   --   EOSABS 0.1  --   --   --   --   BASOSABS 0.0  --   --   --   --     Chemistries   Recent Labs Lab 04/24/15 1132 04/24/15 1146 04/25/15 0421 04/26/15 0338 04/27/15 0333  NA 135 133* 133* 137 136  K 4.1 4.0 3.7 3.4* 3.1*  CL 97* 98* 102 106 105  CO2 25   --  25 23 23   GLUCOSE 213* 210* 92 84 79  BUN 41* 42* 36* 37* 39*  CREATININE 4.07* 4.20*  3.61* 3.09* 2.95*  CALCIUM 13.1*  --  10.5* 9.6 10.2  AST 30  --  24  --   --   ALT 16  --  12*  --   --   ALKPHOS 148*  --  109  --   --   BILITOT 0.5  --  0.5  --   --    ------------------------------------------------------------------------------------------------------------------ estimated creatinine clearance is 17.7 mL/min (by C-G formula based on Cr of 2.95). ------------------------------------------------------------------------------------------------------------------ No results for input(s): HGBA1C in the last 72 hours. ------------------------------------------------------------------------------------------------------------------ No results for input(s): CHOL, HDL, LDLCALC, TRIG, CHOLHDL, LDLDIRECT in the last 72 hours. ------------------------------------------------------------------------------------------------------------------  Recent Labs  04/24/15 1422  TSH 1.798   ------------------------------------------------------------------------------------------------------------------ No results for input(s): VITAMINB12, FOLATE, FERRITIN, TIBC, IRON, RETICCTPCT in the last 72 hours.  Coagulation profile  Recent Labs Lab 04/24/15 1132 04/25/15 0421 04/26/15 0338 04/27/15 0333  INR 2.45* 2.07* 3.05* 3.08*    No results for input(s): DDIMER in the last 72 hours.  Cardiac Enzymes No results for input(s): CKMB, TROPONINI, MYOGLOBIN in the last 168 hours.  Invalid input(s): CK ------------------------------------------------------------------------------------------------------------------ Invalid input(s): POCBNP    MIKHAIL, MARYANN D.O. on 04/27/2015 at 10:28 AM  Between 7am to 7pm - Pager - [phone]  After 7pm go to [email or web address] - password [email or web address]  And look for the night coverage person covering for me after hours  [facility] Office   [phone]

## 2015-04-27 NOTE — Progress Notes (Signed)
ANTICOAGULATION CONSULT NOTE - Follow Up Consult  Pharmacy Consult for Coumadin Indication: LV thrombus  Allergies  Allergen Reactions  . Adhesive [Tape] Other (See Comments)    Blisters   . Avelox [Moxifloxacin Hcl In Nacl] Other (See Comments)    GI upset  . Codeine Other (See Comments)    "crazy"   . Guaifenesin Nausea And Vomiting    Takes Mucinex at home without issue  . Latex Swelling  . Oxycodone Nausea And Vomiting    Takes Percocet at home without issue    Patient Measurements: Weight: 160 lb 3.2 oz (72.666 kg) Heparin Dosing Weight:    Vital Signs: Temp: 97.8 F (36.6 C) (09/25 1200) Temp Source: Oral (09/25 1200) BP: 147/66 mmHg (09/25 1200) Pulse Rate: 76 (09/25 1200)  Labs:  Recent Labs  04/25/15 0421 04/26/15 0338 04/27/15 0333  HGB 12.9 13.2 13.1  HCT 38.3 40.6 40.7  PLT 477* 455* 442*  LABPROT 23.2* 31.0* 31.2*  INR 2.07* 3.05* 3.08*  CREATININE 3.61* 3.09* 2.95*    Estimated Creatinine Clearance: 17.7 mL/min (by C-G formula based on Cr of 2.95).   Assessment: AC: Warfarin PTA for mural thrombus - Admit INR 2.07 on coumadin 1.5mg  qday. INR today 3.08. CBC WNL   Goal of Therapy:  INR 2-3 Monitor platelets by anticoagulation protocol: Yes   Plan:  Continue Coumadin 1.5mg  daily Daily INR.   [name], PharmD, BCPS Clinical Staff Pharmacist Pager [phone]  [name] 04/27/2015,1:10 PM

## 2015-04-27 NOTE — Progress Notes (Signed)
[facility] Work visit - Erling Conte, LCSW  This is [facility] related admission, dx Heart Disease. Code Status is DNR.  Patient seen in room out of bed in chair. Patient has just finished working with PT and tells me she is considering SNF for rehab due to being weak. She expressed being concerned she is so weak and having trouble collecting her thoughts and getting her words out. Offered support and reassurance and education on hospital process for SNF placement. Patient is aware [facility] liaison will follow up tomorrow. Spoke with patient's RN Aniceto Boss re patient's concerns. Later received call from hospital [place] who reports she will initiate SNF search and follow up with patient.   [facility] will continue to follow daily.   Please contact [facility] with questions/concerns at [phone].  Thank you. Erling Conte, [facility]

## 2015-04-28 LAB — BASIC METABOLIC PANEL
ANION GAP: 10 (ref 5–15)
BUN: 42 mg/dL — ABNORMAL HIGH (ref 6–20)
CHLORIDE: 108 mmol/L (ref 101–111)
CO2: 20 mmol/L — ABNORMAL LOW (ref 22–32)
Calcium: 10.3 mg/dL (ref 8.9–10.3)
Creatinine, Ser: 3.66 mg/dL — ABNORMAL HIGH (ref 0.44–1.00)
GFR calc Af Amer: 14 mL/min — ABNORMAL LOW (ref >60)
GFR, EST NON AFRICAN AMERICAN: 12 mL/min — AB (ref >60)
Glucose, Bld: 62 mg/dL — ABNORMAL LOW (ref 65–99)
POTASSIUM: 4.4 mmol/L (ref 3.5–5.1)
SODIUM: 138 mmol/L (ref 135–145)

## 2015-04-28 LAB — GLUCOSE, CAPILLARY
GLUCOSE-CAPILLARY: 83 mg/dL (ref 65–99)
Glucose-Capillary: 109 mg/dL — ABNORMAL HIGH (ref 65–99)
Glucose-Capillary: 168 mg/dL — ABNORMAL HIGH (ref 65–99)
Glucose-Capillary: 95 mg/dL (ref 65–99)

## 2015-04-28 LAB — PROTIME-INR
INR: 3.2 — AB (ref 0.00–1.49)
Prothrombin Time: 32.1 seconds — ABNORMAL HIGH (ref 11.6–15.2)

## 2015-04-28 LAB — OCCULT BLOOD X 1 CARD TO LAB, STOOL: Fecal Occult Bld: POSITIVE — AB

## 2015-04-28 LAB — CREATININE, SERUM
Creatinine, Ser: 3.82 mg/dL — ABNORMAL HIGH (ref 0.44–1.00)
GFR, EST AFRICAN AMERICAN: 13 mL/min — AB (ref >60)
GFR, EST NON AFRICAN AMERICAN: 11 mL/min — AB (ref >60)

## 2015-04-28 LAB — PROCALCITONIN: Procalcitonin: 0.56 ng/mL

## 2015-04-28 MED ORDER — VITAMIN D 1000 UNITS PO TABS
1000.0000 [IU] | ORAL_TABLET | Freq: Every day | ORAL | Status: DC
  Administered 2015-04-29: 1000 [IU] via ORAL
  Filled 2015-04-28: qty 1

## 2015-04-28 MED ORDER — CHOLECALCIFEROL 25 MCG (1000 UT) PO TABS: 1000.0000 [IU] | ORAL_TABLET | Freq: Every day | ORAL | Status: DC

## 2015-04-28 MED ORDER — HYDROCODONE-ACETAMINOPHEN 5-325 MG PO TABS: 1.0000 | ORAL_TABLET | ORAL | Status: DC | PRN

## 2015-04-28 MED ORDER — WARFARIN - PHARMACIST DOSING INPATIENT: Freq: Every day | Status: DC

## 2015-04-28 MED ORDER — SODIUM CHLORIDE 0.9 % IV SOLN
INTRAVENOUS | Status: DC
  Administered 2015-04-28: 09:00:00 via INTRAVENOUS

## 2015-04-28 NOTE — Discharge Summary (Signed)
Physician Discharge Summary  [name] FGH:[id number] DOB: Dec 04, 1945 DOA: 04/24/2015  PCP: Antony Blackbird, MD  Admit date: 04/24/2015 Discharge date: 04/29/2015  Time spent: 45 minutes  Recommendations for Outpatient Follow-up:  Patient will be discharged to skilled nursing facility.  Palliative care to follow patient at the nursing facility.  Continue therapy as recommended by the facility.  Patient will need to follow up with primary care provider within one week of discharge, repeat CBC and CMP. Follow up with Dr. Joelyn Oms.  Patient should continue medications as prescribed.  Patient should follow a soft diet.  Discharge Diagnoses:  Acute encephalopathy Hypercalcemia Metabolic alkalosis/lactic acidosis Acute on chronic kidney disease, stage IV Chronic systolic heart failure Diabetes mellitus, type II Hematochezia Coronary artery disease Apical thrombus Chronic abdominal pain/GERD COPD Deconditioning Nonsevere malnutrition  Discharge Condition: Stable  Diet recommendation: soft  Filed Weights   04/26/15 0410 04/27/15 0426 04/29/15 0538  Weight: 74.889 kg (165 lb 1.6 oz) 72.666 kg (160 lb 3.2 oz) 71.8 kg (158 lb 4.6 oz)    History of present illness:  on 04/24/2015 by Dr. Loma Boston [name] is a 69 y.o. 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 Patient presents with altered mental status. He was brought to the emergency room by her brother who gives a history as the patient is not following oral directions. The patient was first noted to be abnormal by her brother when he picked her up from her house. She walked past without noticing him. The  patient was taken to her urologist's office, who became concerned and sent her to the emergency department for workup. No palliating or provoking factors. Patient has remains relatively encephalopathic.  Hospital Course:  Acute encephalopathy -Resolved, upon admission, patient was noted to have some question of aphasia/dysarthria -CT of the head: No acute intracranial findings -Neurology consulted and appreciated -MRI brain: No acute intracranial abnormality -UA/CXR unremarkable  Hypercalcemia -Unknown etiology -Ca improving, 9.7 -Vitamin D 21.5, PTH 13 (start Vit D supplementation)  Metabolic alkalosis/ lactic acidosis -lactic acid improved, 1.8 (from 3.7) -Started on vanc/zosyn empirically, discontinued  Acute on chronic kidney disease, stage IV -Creatinine improving, currently 3.6 -Follows with Dr. Joelyn Oms -Spoke with Dr. Joelyn Oms, patient will follow up with him.  She likely is not a candidate for HD.  Will reduce lasix dose.   Chronic systolic heart failure -Does not appear to be volume overloaded at this time, currently compensated -Echocardiogram shows EF of 30-35% -Continue coreg, lasix (reduced dose), statin  Diabetes mellitus, type II -Continue Lantus, ISS, with CBG monitoring  Hematochezia -No further blood noted, more diarrhea -Hemoglobin stable, 13.1 -Will continue to monitor CBC -Patient had EGD on 03/14/2015: normal appearing Esophagus and GE junction -Colonoscopy on 03/14/2015 showed for proximal sigmoid polyps, mild sigmoid diverticulosis otherwise normal colon and rectum- biopsy were normal. Polyps were not removed due to anticoagulation.  Coronary artery disease -Suspected ischemic cardiomyopathy -Patient is on Coumadin not aspirin -Currently chest pain-free -Continue coreg, statin  Apical thrombus -Continue Coumadin per pharmacy (INR 2.97)  Chronic abdominal pain/GERD -Continue bentyl, PPI, pepcid -Diet advanced to soft, patient tolerated  well  COPD  -Continue 2L O2, dulera, albuterol PRN  Deconditioning -PT evaluated patient and recommended SNF  Nonsevere Malnutrition -  Nutrition consulted, continue feeding supplements  Code Status: DNR  Procedures  None  Consults  Neurology  Discharge Exam: Filed Vitals:   04/29/15 0538  BP: 138/67  Pulse: 86  Temp: 98.4 F (36.9 C)  Resp: 18    Exam  General: Well developed, well nourished, NAD  HEENT: NCAT,mucous membranes moist.   Cardiovascular: S1 S2 auscultated, RRR  Respiratory: Clear to auscultation  Abdomen: Soft, nontender, nondistended, + bowel sounds  Extremities: warm dry without cyanosis clubbing or edema  Neuro: AAOx3, nonfocal  Psych: Appropriate mood and affect  Discharge Instructions      Discharge Instructions    Discharge instructions    Complete by:  As directed   Patient will be discharged to skilled nursing facility.  Continue therapy as recommended by the facility.  Patient will need to follow up with primary care provider within one week of discharge, repeat CBC and CMP.  Patient should continue medications as prescribed.  Patient should follow a soft diet.     Discharge instructions    Complete by:  As directed   Patient will be discharged to skilled nursing facility.  Palliative care to follow patient at the nursing facility.  Continue therapy as recommended by the facility.  Patient will need to follow up with primary care provider within one week of discharge, repeat CBC and CMP.  Patient should continue medications as prescribed.  Patient should follow a soft diet.            Medication List    TAKE these medications        acetaminophen 500 MG tablet  Commonly known as:  TYLENOL  Take 1,000 mg by mouth 2 (two) times daily as needed (pain).     albuterol (2.5 MG/3ML) 0.083% nebulizer solution  Commonly known as:  PROVENTIL  Take 2.5 mg by nebulization every 2 (two) hours as needed for wheezing or shortness of  breath.     albuterol 108 (90 BASE) MCG/ACT inhaler  Commonly known as:  PROVENTIL HFA;VENTOLIN HFA  Inhale 2 puffs into the lungs every 4 (four) hours as needed for wheezing or shortness of breath.     atorvastatin 20 MG tablet  Commonly known as:  LIPITOR  Take 20 mg by mouth at bedtime.     BEANO PO  Take 1 tablet by mouth daily.     carvedilol 3.125 MG tablet  Commonly known as:  COREG  Take 1 tablet (3.125 mg total) by mouth 2 (two) times daily with a meal.     Cholecalciferol 1000 UNITS tablet  Take 1 tablet (1,000 Units total) by mouth daily.     dicyclomine 10 MG capsule  Commonly known as:  BENTYL  Take 1 capsule (10 mg total) by mouth 4 (four) times daily -  before meals and at bedtime.     esomeprazole 40 MG capsule  Commonly known as:  NEXIUM  Take 1 capsule by mouth 30 min before breakfast.     famotidine 20 MG tablet  Commonly known as:  PEPCID  Take 1 tablet (20 mg total) by mouth daily.     feeding supplement Liqd  Take 1 Container by mouth 3 (three) times daily between meals.     ferrous sulfate 325 (65 FE) MG tablet  Take 325 mg by mouth daily with breakfast.     fluticasone 50 MCG/ACT nasal spray  Commonly known as:  FLONASE  Place 2 sprays into both nostrils daily as needed for allergies or  rhinitis.     Fluticasone-Salmeterol 250-50 MCG/DOSE Aepb  Commonly known as:  ADVAIR  Inhale 1 puff into the lungs 2 (two) times daily.     furosemide 40 MG tablet  Commonly known as:  LASIX  Take 1 tablet (40 mg total) by mouth daily.     HYDROcodone-acetaminophen 5-325 MG per tablet  Commonly known as:  NORCO/VICODIN  Take 1 tablet by mouth every 4 (four) hours as needed for moderate pain.     hydrocortisone 2.5 % rectal cream  Commonly known as:  ANUSOL-HC  Place 1 application rectally 4 (four) times daily.     insulin lispro 100 UNIT/ML KiwkPen  Commonly known as:  HUMALOG KWIKPEN  Inject 0.01-0.1 mLs (1-10 Units total) into the skin 3 (three)  times daily. Sliding scale CBG 70 - 120: 0 units CBG 121 - 150: 1 unit,  CBG 151 - 200: 2 units,  CBG 201 - 250: 3 units,  CBG 251 - 300: 5 units,  CBG 301 - 350: 7 units,  CBG 351 - 400: 9 units   CBG > 400: 10 units and notify your MD     multivitamin with minerals tablet  Take 1 tablet by mouth daily.     ondansetron 4 MG tablet  Commonly known as:  ZOFRAN  Take 1 tablet (4 mg total) by mouth every 8 (eight) hours as needed for nausea or vomiting.     OTEZLA 30 MG Tabs  Generic drug:  Apremilast  Take 30 mg by mouth daily.     promethazine 25 MG tablet  Commonly known as:  PHENERGAN  Take 1 tablet (25 mg total) by mouth every 4 (four) hours as needed for nausea or vomiting.     saccharomyces boulardii 250 MG capsule  Commonly known as:  FLORASTOR  Take 1 capsule (250 mg total) by mouth 2 (two) times daily.     sodium bicarbonate 650 MG tablet  Take 1 tablet (650 mg total) by mouth 2 (two) times daily.     TOUJEO SOLOSTAR 300 UNIT/ML Sopn  Generic drug:  Insulin Glargine  Inject 40 Units into the skin every morning.     triamcinolone cream 0.1 %  Commonly known as:  KENALOG  Apply 1 application topically 2 (two) times daily.     warfarin 1 MG tablet  Commonly known as:  COUMADIN  Take 1.5 tablets (1.5 mg total) by mouth every evening.       Allergies  Allergen Reactions  . Adhesive [Tape] Other (See Comments)    Blisters   . Avelox [Moxifloxacin Hcl In Nacl] Other (See Comments)    GI upset  . Codeine Other (See Comments)    "crazy"   . Guaifenesin Nausea And Vomiting    Takes Mucinex at home without issue  . Latex Swelling  . Oxycodone Nausea And Vomiting    Takes Percocet at home without issue   Follow-up Information    Follow up with FULP, CAMMIE, MD. Schedule an appointment as soon as possible for a visit in 1 week.   Specialty:  Family Medicine   Why:  Hospital follow up    Contact information:   [address] [address] [phone]        Please follow up.   Why:  MD @ SNF WILL FOLLOW UP       The results of significant diagnostics from this hospitalization (including imaging, microbiology, ancillary and laboratory) are listed below for reference.  Significant Diagnostic Studies: Ct Head Wo Contrast  04/24/2015   CLINICAL DATA:  Altered mental status.  Unable to speak.  Aphasia.  EXAM: CT HEAD WITHOUT CONTRAST  TECHNIQUE: Contiguous axial images were obtained from the base of the skull through the vertex without intravenous contrast.  COMPARISON:  03/31/2012  FINDINGS: The brainstem, cerebellum, cerebral peduncles, thalamus, basal ganglia, basilar cisterns, and ventricular system appear within normal limits. No intracranial hemorrhage, mass lesion, or acute CVA.  Right mastoid effusion. No fluid in the middle ear. Globes extend 2.4 cm anterior to the interzygomatic line. Prominent orbital adipose tissue with exophthalmos.  IMPRESSION: 1. No acute intracranial findings. 2. 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 3. Right mastoid effusion.   Electronically Signed   By: Van Clines M.D.   On: 04/24/2015 12:59   Mr Brain Wo Contrast  04/25/2015   CLINICAL DATA:  Altered mental status.  Aphasia.  EXAM: MRI HEAD WITHOUT CONTRAST  TECHNIQUE: Multiplanar, multiecho pulse sequences of the brain and surrounding structures were obtained without intravenous contrast.  COMPARISON:  Head CT 04/24/2015 and MRI 04/12/2006  FINDINGS: 1 cm mildly prominent focus of CSF above the pituitary gland is unchanged. There is no evidence of acute infarct, intracranial hemorrhage, mass, midline shift, or extra-axial fluid collection. There is mild generalized cerebral atrophy, slightly progressed from the prior MRI but within normal limits for age. There are several small foci of T2 hyperintensity in the white matter of both cerebral hemispheres as well as in the pons, nonspecific but may reflect minimal  chronic small vessel ischemic disease.  Prior bilateral cataract extraction is noted. A chronic, small to moderate right mastoid effusion is noted. Major intracranial vascular flow voids are preserved. Advanced multilevel disc degeneration is partially visualized in the cervical spine.  IMPRESSION: 1. No acute intracranial abnormality. 2. Minimal chronic small vessel ischemic disease. 3. Chronic right mastoid effusion.   Electronically Signed   By: Logan Bores M.D.   On: 04/25/2015 11:37   Dg Chest Port 1 View  04/24/2015   CLINICAL DATA:  Shortness of breath.  EXAM: PORTABLE CHEST 1 VIEW  COMPARISON:  March 10, 2015.  FINDINGS: The heart size and mediastinal contours are within normal limits. Both lungs are clear. No pneumothorax or pleural effusion is noted. The visualized skeletal structures are unremarkable.  IMPRESSION: No acute cardiopulmonary abnormality seen.   Electronically Signed   By: Marijo Conception, M.D.   On: 04/24/2015 12:22    Microbiology: No results found for this or any previous visit (from the past 240 hour(s)).   Labs: Basic Metabolic Panel:  Recent Labs Lab 04/25/15 0421 04/26/15 0338 04/27/15 0333 04/28/15 0301 04/28/15 1149 04/29/15 0249  NA 133* 137 136 138  --  137  K 3.7 3.4* 3.1* 4.4  --  4.0  CL 102 106 105 108  --  107  CO2 25 23 23  20*  --  20*  GLUCOSE 92 84 79 62*  --  87  BUN 36* 37* 39* 42*  --  46*  CREATININE 3.61* 3.09* 2.95* 3.66* 3.82* 3.68*  CALCIUM 10.5* 9.6 10.2 10.3  --  9.7   Liver Function Tests:  Recent Labs Lab 04/24/15 1132 04/25/15 0421  AST 30 24  ALT 16 12*  ALKPHOS 148* 109  BILITOT 0.5 0.5  PROT 6.3* 4.9*  ALBUMIN 3.0* 2.2*   No results for input(s): LIPASE, AMYLASE in the last 168 hours.  Recent Labs Lab  04/24/15 1422  AMMONIA 33   CBC:  Recent Labs Lab 04/24/15 1132 04/24/15 1146 04/25/15 0421 04/26/15 0338 04/27/15 0333  WBC 11.2*  --  9.4 11.1* 10.6*  NEUTROABS 4.4  --   --   --   --   HGB 14.9  16.3* 12.9 13.2 13.1  HCT 44.2 48.0* 38.3 40.6 40.7  MCV 90.8  --  90.3 93.1 93.8  PLT 576*  --  477* 455* 442*   Cardiac Enzymes: No results for input(s): CKTOTAL, CKMB, CKMBINDEX, TROPONINI in the last 168 hours. BNP: BNP (last 3 results)  Recent Labs  01/21/15 1830 02/02/15 0517 02/24/15 1612  BNP 1056.8* 2924.8* 475.5*    ProBNP (last 3 results)  Recent Labs  06/24/14 0945  PROBNP 324.0*    CBG:  Recent Labs Lab 04/28/15 0547 04/28/15 1133 04/28/15 1750 04/28/15 2331 04/29/15 0541  GLUCAP 83 168* 109* 95 86       Signed:  MIKHAIL, MARYANN  [facility] 04/29/2015, 10:14 AM

## 2015-04-28 NOTE — Clinical Social Work Placement (Addendum)
   CLINICAL SOCIAL WORK PLACEMENT  NOTE  Date:  04/28/2015  Patient Details  Name: [name] MRN: [id number] Date of Birth: Dec 06, 1945  Clinical Social Work is seeking post-discharge placement for this patient at the [place] level of care (*CSW will initial, date and re-position this form in  chart as items are completed):  Yes   Patient/family provided with [place] Work Department's list of facilities offering this level of care within the geographic area requested by the patient (or if unable, by the patient's family).  Yes   Patient/family informed of their freedom to choose among providers that offer the needed level of care, that participate in Medicare, Medicaid or managed care program needed by the patient, have an available bed and are willing to accept the patient.  Yes   Patient/family informed of [place]'s ownership interest in [facility] and [facility], as well as of the fact that they are under no obligation to receive care at these facilities.  PASRR submitted to EDS on 04/28/15     PASRR number received on 04/28/15     Existing PASRR number confirmed on       FL2 transmitted to all facilities in geographic area requested by pt/family on 04/28/15     FL2 transmitted to all facilities within larger geographic area on       Patient informed that his/her managed care company has contracts with or will negotiate with certain facilities, including the following:   [facility])         Patient/family informed of bed offers received.  Patient chooses bed at       Physician recommends and patient chooses bed at      Patient to be transferred to   on  .  Patient to be transferred to facility by [name])     Patient family notified on   of transfer.  Name of family member notified:        PHYSICIAN Please prepare priority discharge summary, including medications, Please sign FL2, Please prepare  prescriptions, Please sign DNR     Additional Comment:    _______________________________________________ Williemae Area, LCSW 04/28/2015, 5:50 PM

## 2015-04-28 NOTE — Care Management Important Message (Signed)
Important Message  Patient Details  Name: [name] MRN: [id number] Date of Birth: 1945-09-28   Medicare Important Message Given:  Yes-second notification given    Delorse Lek 04/28/2015, 3:10 PM

## 2015-04-28 NOTE — Progress Notes (Signed)
Inpatient [facility] RM 3E 28 [facility] RN Visit-Stacie Delice Lesch RN, BSN  This is a related admission to [facility] DX: Heart Disease. Patient is a DNR. Pt. sitting in recliner when Probation officer arrived with two family members. Pt. denies any pain. She acknowledges that she cannot return home alone due to weakness and is choosing rehab at discharge. Pt. Advised her [facility] LCSW Benedict Needy will be coming to see her in the next 24 hours. O2 currently at   4L n/c and resps regular. [facility] will continue to follow daily while in the hospital.  Please call with any questions.   [facility] Liaison [phone]

## 2015-04-28 NOTE — Plan of Care (Signed)
Problem: Phase I Progression Outcomes Goal: OOB as tolerated unless otherwise ordered Outcome: Completed/Met Date Met:  04/28/15 Up in recliner for several hours today

## 2015-04-28 NOTE — Progress Notes (Signed)
[facility]                                                                              Patient Demographics  [name], is a 69 y.o. female, DOB - 01-03-1946, MOQ:[id number]  Admit date - 04/24/2015   Admitting Physician Truett Mainland, DO  Outpatient Primary MD for the patient is FULP, CAMMIE, MD  LOS - 4   Chief Complaint  Patient presents with  . Aphasia  . Abdominal Pain      HPI on 04/24/2015 by Dr. Loma Boston [name] is a 69 y.o. 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 Patient presents with altered mental status. He was brought to the emergency room by her brother who gives a history as the patient is not following oral directions. The patient was first noted to be abnormal by her brother when he picked her up from her house. She walked past without noticing him. The patient was taken to her urologist's office, who became concerned and sent her to the emergency department for workup. No palliating or provoking factors. Patient has remains relatively encephalopathic.  Assessment & Plan   Acute encephalopathy -Resolved, upon admission, patient was noted to have some question of aphasia/dysarthria -CT of the head: No acute intracranial findings -Neurology consulted and appreciated -MRI brain: No acute intracranial abnormality -UA/CXR unremarkable  Hypercalcemia -Unknown etiology -Ca improving, 10.3  -Vitamin D 21.5, PTH 13 (continue Vit D supplementation)  Metabolic alkalosis/ lactic acidosis -lactic acid improved, 1.8 (from 3.7) -Started on vanc/zosyn empirically, discontinued  Acute on chronic kidney disease, stage  IV -Creatinine improving, currently 3.8 -Will start gentle IVF, hold lasix and continue to monitor BMP -Follows with Dr. Joelyn Oms  Chronic systolic heart failure -Does not appear to be volume overloaded at this time, currently compensated -Echocardiogram shows EF of 30-35% -Continue coreg, lasix, statin  Diabetes mellitus, type II -Continue Lantus, ISS, with CBG monitoring  Hematochezia -No further blood noted, more diarrhea -Hemoglobin stable, 13.1 -Will continue to monitor CBC -Patient had EGD on 03/14/2015: normal appearing Esophagus and GE junction -Colonoscopy on 03/14/2015 showed for proximal sigmoid polyps, mild sigmoid diverticulosis otherwise normal colon and rectum- biopsy were normal. Polyps were not removed due to anticoagulation.  Coronary artery disease -Suspected ischemic cardiomyopathy -Patient is on Coumadin not aspirin -Currently chest pain-free -Continue coreg, statin  Apical thrombus -Continue Coumadin per pharmacy (INR 3.20)  Chronic abdominal pain/GERD -Continue bentyl, PPI, pepcid -Diet advanced to soft, patient tolerated well  COPD  -Continue 2L O2, dulera, albuterol PRN  Deconditioning -PT evaluated patient and recommended SNF -Pending OT  Nonsevere Malnutrition -Nutrition consulted, continue feeding supplements  Code Status: DNR  Family Communication: None at bedside. Sister via phone  Disposition Plan: Admitted. Continue to monitor Cr, give IVF.  Pending SNF.   Time Spent in minutes   30 minutes  Procedures  None  Consults   Neurology  DVT Prophylaxis  Coumadin  Lab Results  Component Value Date   PLT 442* 04/27/2015    Medications  Scheduled Meds: . atorvastatin  20 mg Oral QHS  . carvedilol  3.125 mg Oral BID WC  . cholecalciferol  1,000 Units Oral Daily  . dicyclomine  10 mg Oral TID AC & HS  . famotidine  20 mg Oral Daily  . feeding supplement (PRO-STAT SUGAR FREE 64)  30 mL Oral BID  . insulin aspart  0-15 Units  Subcutaneous Q6H  . insulin glargine  20 Units Subcutaneous Daily  . mometasone-formoterol  2 puff Inhalation BID  . pantoprazole  80 mg Oral Q1200  . saccharomyces boulardii  250 mg Oral BID  . warfarin  1.5 mg Oral QPM  . Warfarin - Pharmacist Dosing Inpatient   Does not apply q1800   Continuous Infusions: . sodium chloride 75 mL/hr at 04/28/15 0838   PRN Meds:.acetaminophen **OR** acetaminophen, albuterol, alum & mag hydroxide-simeth, fluticasone, HYDROcodone-acetaminophen, ondansetron **OR** ondansetron (ZOFRAN) IV, promethazine  Antibiotics    Anti-infectives    Start     Dose/Rate Route Frequency Ordered Stop   04/26/15 1500  vancomycin (VANCOCIN) IVPB 1000 mg/200 mL premix  Status:  Discontinued     1,000 mg 200 mL/hr over 60 Minutes Intravenous Every 48 hours 04/24/15 1412 04/26/15 1046   04/24/15 2030  piperacillin-tazobactam (ZOSYN) IVPB 2.25 g  Status:  Discontinued     2.25 g 100 mL/hr over 30 Minutes Intravenous Every 6 hours 04/24/15 1412 04/26/15 1046   04/24/15 1415  piperacillin-tazobactam (ZOSYN) IVPB 3.375 g     3.375 g 100 mL/hr over 30 Minutes Intravenous  Once 04/24/15 1400 04/24/15 1459   04/24/15 1415  vancomycin (VANCOCIN) 1,500 mg in sodium chloride 0.9 % 500 mL IVPB     1,500 mg 250 mL/hr over 120 Minutes Intravenous STAT 04/24/15 1400 04/24/15 1659      Subjective:   [name] seen and examined today.  Patient states she is feeling better today denies any abdominal pain at this time, nausea or vomiting. Feels her diarrhea has also improved since her diet has been advanced. Currently denies any chest pain shortness of breath, dizziness or headache.    Objective:   Filed Vitals:   04/27/15 2053 04/28/15 0600 04/28/15 0913 04/28/15 1210  BP:  144/51  139/76  Pulse:  73  148  Temp:  97.5 F (36.4 C)  97.9 F (36.6 C)  TempSrc:  Oral  Oral  Resp:  16  16  Weight:      SpO2: 99% 93% 95% 99%    Wt Readings from Last 3 Encounters:    04/27/15 72.666 kg (160 lb 3.2 oz)  04/24/15 72.576 kg (160 lb)  04/02/15 69.4 kg (153 lb)     Intake/Output Summary (Last 24 hours) at 04/28/15 1308 Last data filed at 04/28/15 1045  Gross per 24 hour  Intake    960 ml  Output    600 ml  Net    360 ml    Exam  General: Well developed, well nourished, NAD  HEENT: NCAT,mucous membranes moist.   Cardiovascular: S1 S2 auscultated, RRR  Respiratory: Clear to auscultation   Abdomen: Soft, nontender, nondistended, + bowel sounds  Extremities: warm dry without cyanosis clubbing or edema  Neuro: AAOx3, nonfocal  Psych: Appropriate mood and affect  Data Review   Micro Results No results found for this or any previous visit (from the past 240 hour(s)).  Radiology Reports Ct Head Wo  Contrast  04/24/2015   CLINICAL DATA:  Altered mental status.  Unable to speak.  Aphasia.  EXAM: CT HEAD WITHOUT CONTRAST  TECHNIQUE: Contiguous axial images were obtained from the base of the skull through the vertex without intravenous contrast.  COMPARISON:  03/31/2012  FINDINGS: The brainstem, cerebellum, cerebral peduncles, thalamus, basal ganglia, basilar cisterns, and ventricular system appear within normal limits. No intracranial hemorrhage, mass lesion, or acute CVA.  Right mastoid effusion. No fluid in the middle ear. Globes extend 2.4 cm anterior to the interzygomatic line. Prominent orbital adipose tissue with exophthalmos.  IMPRESSION: 1. No acute intracranial findings. 2. 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 3. Right mastoid effusion.   Electronically Signed   By: Van Clines M.D.   On: 04/24/2015 12:59   Mr Brain Wo Contrast  04/25/2015   CLINICAL DATA:  Altered mental status.  Aphasia.  EXAM: MRI HEAD WITHOUT CONTRAST  TECHNIQUE: Multiplanar, multiecho pulse sequences of the brain and surrounding structures were obtained without intravenous contrast.  COMPARISON:  Head CT 04/24/2015 and  MRI 04/12/2006  FINDINGS: 1 cm mildly prominent focus of CSF above the pituitary gland is unchanged. There is no evidence of acute infarct, intracranial hemorrhage, mass, midline shift, or extra-axial fluid collection. There is mild generalized cerebral atrophy, slightly progressed from the prior MRI but within normal limits for age. There are several small foci of T2 hyperintensity in the white matter of both cerebral hemispheres as well as in the pons, nonspecific but may reflect minimal chronic small vessel ischemic disease.  Prior bilateral cataract extraction is noted. A chronic, small to moderate right mastoid effusion is noted. Major intracranial vascular flow voids are preserved. Advanced multilevel disc degeneration is partially visualized in the cervical spine.  IMPRESSION: 1. No acute intracranial abnormality. 2. Minimal chronic small vessel ischemic disease. 3. Chronic right mastoid effusion.   Electronically Signed   By: Logan Bores M.D.   On: 04/25/2015 11:37   Dg Chest Port 1 View  04/24/2015   CLINICAL DATA:  Shortness of breath.  EXAM: PORTABLE CHEST 1 VIEW  COMPARISON:  March 10, 2015.  FINDINGS: The heart size and mediastinal contours are within normal limits. Both lungs are clear. No pneumothorax or pleural effusion is noted. The visualized skeletal structures are unremarkable.  IMPRESSION: No acute cardiopulmonary abnormality seen.   Electronically Signed   By: Marijo Conception, M.D.   On: 04/24/2015 12:22    CBC  Recent Labs Lab 04/24/15 1132 04/24/15 1146 04/25/15 0421 04/26/15 0338 04/27/15 0333  WBC 11.2*  --  9.4 11.1* 10.6*  HGB 14.9 16.3* 12.9 13.2 13.1  HCT 44.2 48.0* 38.3 40.6 40.7  PLT 576*  --  477* 455* 442*  MCV 90.8  --  90.3 93.1 93.8  MCH 30.6  --  30.4 30.3 30.2  MCHC 33.7  --  33.7 32.5 32.2  RDW 15.0  --  15.4 15.7* 15.1  LYMPHSABS 5.5*  --   --   --   --   MONOABS 1.2*  --   --   --   --   EOSABS 0.1  --   --   --   --   BASOSABS 0.0  --   --   --    --     Chemistries   Recent Labs Lab 04/24/15 1132 04/24/15 1146 04/25/15 0421 04/26/15 0338 04/27/15 0333 04/28/15 0301 04/28/15 1149  NA 135 133* 133* 137 136 138  --  K 4.1 4.0 3.7 3.4* 3.1* 4.4  --   CL 97* 98* 102 106 105 108  --   CO2 25  --  25 23 23  20*  --   GLUCOSE 213* 210* 92 84 79 62*  --   BUN 41* 42* 36* 37* 39* 42*  --   CREATININE 4.07* 4.20* 3.61* 3.09* 2.95* 3.66* 3.82*  CALCIUM 13.1*  --  10.5* 9.6 10.2 10.3  --   AST 30  --  24  --   --   --   --   ALT 16  --  12*  --   --   --   --   ALKPHOS 148*  --  109  --   --   --   --   BILITOT 0.5  --  0.5  --   --   --   --    ------------------------------------------------------------------------------------------------------------------ estimated creatinine clearance is 13.6 mL/min (by C-G formula based on Cr of 3.82). ------------------------------------------------------------------------------------------------------------------ No results for input(s): HGBA1C in the last 72 hours. ------------------------------------------------------------------------------------------------------------------ No results for input(s): CHOL, HDL, LDLCALC, TRIG, CHOLHDL, LDLDIRECT in the last 72 hours. ------------------------------------------------------------------------------------------------------------------ No results for input(s): TSH, T4TOTAL, T3FREE, THYROIDAB in the last 72 hours.  Invalid input(s): FREET3 ------------------------------------------------------------------------------------------------------------------ No results for input(s): VITAMINB12, FOLATE, FERRITIN, TIBC, IRON, RETICCTPCT in the last 72 hours.  Coagulation profile  Recent Labs Lab 04/24/15 1132 04/25/15 0421 04/26/15 0338 04/27/15 0333 04/28/15 0301  INR 2.45* 2.07* 3.05* 3.08* 3.20*    No results for input(s): DDIMER in the last 72 hours.  Cardiac Enzymes No results for input(s): CKMB, TROPONINI, MYOGLOBIN in the last 168  hours.  Invalid input(s): CK ------------------------------------------------------------------------------------------------------------------ Invalid input(s): POCBNP    MIKHAIL, MARYANN D.O. on 04/28/2015 at 1:08 PM  Between 7am to 7pm - Pager - [phone]  After 7pm go to [email or web address] - password [email or web address]  And look for the night coverage person covering for me after hours  [facility] Office  [phone]

## 2015-04-28 NOTE — Progress Notes (Signed)
ANTICOAGULATION CONSULT NOTE - Follow Up Consult  Pharmacy Consult for Coumadin Indication: LV thrombus  Allergies  Allergen Reactions  . Adhesive [Tape] Other (See Comments)    Blisters   . Avelox [Moxifloxacin Hcl In Nacl] Other (See Comments)    GI upset  . Codeine Other (See Comments)    "crazy"   . Guaifenesin Nausea And Vomiting    Takes Mucinex at home without issue  . Latex Swelling  . Oxycodone Nausea And Vomiting    Takes Percocet at home without issue    Patient Measurements: Weight: 160 lb 3.2 oz (72.666 kg) Heparin Dosing Weight:    Vital Signs: Temp: 97.5 F (36.4 C) (09/26 0600) Temp Source: Oral (09/26 0600) BP: 144/51 mmHg (09/26 0600) Pulse Rate: 73 (09/26 0600)  Labs:  Recent Labs  04/26/15 0338 04/27/15 0333 04/28/15 0301  HGB 13.2 13.1  --   HCT 40.6 40.7  --   PLT 455* 442*  --   LABPROT 31.0* 31.2* 32.1*  INR 3.05* 3.08* 3.20*  CREATININE 3.09* 2.95* 3.66*    Estimated Creatinine Clearance: 14.2 mL/min (by C-G formula based on Cr of 3.66).   Assessment: AC: Warfarin PTA for mural thrombus - on admission patient was therapeutic on 1.5mg  Coumadin daily. This dose has been continued in hospital but now patient is supra-therapeutic with an INR of 3.20. H/H has been stable. No bleeding noted. No drug interactions noted. This could be dietary differences.  Goal of Therapy:  INR 2-3 Monitor platelets by anticoagulation protocol: Yes   Plan:  Hold Coumadin today.  Daily INR.  Sloan Leiter, PharmD, BCPS Clinical Pharmacist [phone] 04/28/2015,12:08 PM

## 2015-04-28 NOTE — Evaluation (Signed)
Occupational Therapy Evaluation Patient Details Name: [name] MRN: [id number] DOB: 09-29-1945 Today's Date: 04/28/2015    History of Present Illness Pt. admitted 04/24/15 with AMS, encephalopathy, hypercalcemia, lactic acidosis  metabolic acidosis, acute renal failure on CKD IV, .  Pt. 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  Pt is being followed by Hospice at home due to heart failure   Clinical Impression   Pt admitted with above. She demonstrates the below listed deficits and will benefit from continued OT to maximize safety and independence with BADLs.  Pt requires min - mod A for ADLs, and fatigues rapidly.  She will require 24 hour assist at discharge.  Recommend SNF unless he becomes residential Hospice appropriate.  Will follow acutely.       Follow Up Recommendations  SNF    Equipment Recommendations  None recommended by OT    Recommendations for Other Services       Precautions / Restrictions Precautions Precautions: Fall      Mobility Bed Mobility                  Transfers Overall transfer level: Needs assistance Equipment used: 1 person hand held assist;Rolling walker (2 wheeled) Transfers: Sit to/from [facility] Sit to Stand: Min assist Stand pivot transfers: Min assist       General transfer comment: Min A for balance    Balance   Sitting-balance support: Feet supported Sitting balance-Leahy Scale: Fair       Standing balance-Leahy Scale: Poor                              ADL Overall ADL's : Needs assistance/impaired Eating/Feeding: Independent;Sitting   Grooming: Wash/dry hands;Wash/dry face;Oral care;Set up;Sitting   Upper Body Bathing: Minimal assitance;Sitting   Lower Body Bathing: Moderate assistance;Sit to/from stand   Upper Body Dressing : Minimal assistance;Sitting   Lower Body Dressing: Moderate assistance;Sit  to/from stand   Toilet Transfer: Minimal assistance;Stand-pivot;BSC;RW   Toileting- Clothing Manipulation and Hygiene: Moderate assistance;Sit to/from stand Toileting - Clothing Manipulation Details (indicate cue type and reason): Pt transferred from recliner to [facility] x 3 and fatigued at end of session      Functional mobility during ADLs: Minimal assistance;Rolling walker General ADL Comments: Pt fatigues quickly.   Requires assist to due fatigue and impaired balance      Vision     Perception     Praxis      Pertinent Vitals/Pain Pain Assessment: No/denies pain     Hand Dominance Right   Extremity/Trunk Assessment Upper Extremity Assessment Upper Extremity Assessment: Generalized weakness   Lower Extremity Assessment Lower Extremity Assessment: Defer to PT evaluation   Cervical / Trunk Assessment Cervical / Trunk Assessment: Normal   Communication Communication Communication: No difficulties   Cognition Arousal/Alertness: Awake/alert Behavior During Therapy: WFL for tasks assessed/performed Overall Cognitive Status: No family/caregiver present to determine baseline cognitive functioning                     General Comments       Exercises Exercises: General Upper Extremity     Shoulder Instructions      Home Living Family/patient expects to be discharged to:: Skilled nursing facility  Prior Functioning/Environment Level of Independence: Independent with assistive device(s)        Comments: Using 4 wheeled RW PTA, was haveing in home visits from Hospice RN, aide and SW    OT Diagnosis: Generalized weakness;Cognitive deficits   OT Problem List: Decreased strength;Decreased activity tolerance;Impaired balance (sitting and/or standing);Cardiopulmonary status limiting activity   OT Treatment/Interventions: Self-care/ADL training;Therapeutic exercise;Therapeutic activities;Patient/family  education;Balance training    OT Goals(Current goals can be found in the care plan section) Acute Rehab OT Goals Patient Stated Goal: Pt did not state  OT Goal Formulation: With patient Time For Goal Achievement: 05/12/15 Potential to Achieve Goals: Good ADL Goals Pt Will Perform Grooming: with min guard assist;standing Pt Will Perform Upper Body Bathing: with set-up;sitting Pt Will Perform Lower Body Bathing: with min guard assist;sit to/from stand Pt Will Perform Upper Body Dressing: with supervision;sitting Pt Will Perform Lower Body Dressing: with min guard assist;sit to/from stand Pt Will Transfer to Toilet: ambulating;with min guard assist;regular height toilet;bedside commode;grab bars Pt Will Perform Toileting - Clothing Manipulation and hygiene: with min guard assist;sit to/from stand  OT Frequency: Min 2X/week   Barriers to D/C: Decreased caregiver support  `       Co-evaluation              End of Session Equipment Utilized During Treatment: Oxygen;Rolling walker Nurse Communication: Mobility status  Activity Tolerance: Patient limited by fatigue Patient left: in chair;with call bell/phone within reach;with chair alarm set   Time: 1425-1458 OT Time Calculation (min): 33 min Charges:  OT General Charges $OT Visit: 1 Procedure OT Evaluation $Initial OT Evaluation Tier I: 1 Procedure OT Treatments $Self Care/Home Management : 8-22 mins G-Codes:    Conarpe, Wendi M 2015-05-19, 3:09 PM

## 2015-04-28 NOTE — Discharge Instructions (Signed)

## 2015-04-28 NOTE — Clinical Social Work Note (Signed)
Clinical Social Work Assessment  Patient Details  Name: [name] MRN: [id number] Date of Birth: 1946/06/23  Date of referral:  04/28/15               Reason for consult:  Facility Placement                Permission sought to share information with:  Facility Sport and exercise psychologist, Family Supports Permission granted to share information::  Yes, Verbal Permission Granted  Name::     [place] SNF's, Sister Annette    Housing/Transportation Living arrangements for the past 2 months:  Single Family Home Source of Information:  Patient Patient Interpreter Needed:  None Criminal Activity/Legal Involvement Pertinent to Current Situation/Hospitalization:  No - Comment as needed Significant Relationships:  Siblings Lives with:  Self Do you feel safe going back to the place where you live?  No Need for family participation in patient care:  Yes (Comment)  Care giving concerns:  Home with Hospice and [place]. Not able to manage care at this time.   Social Worker assessment / plan:  CSW referred to talk to this 69 year old female who has been referred for SNF level of care. She states that she has brothers and sisters but does not have anyone who can stay with her 24 hours a day. She is currently followed by [facility] and is not a candidate at this time for residential hospice care.  Discussed bed search process for SNF bed and patient is agreeable. She stated her preferences as [facility], [facility] and [facility] respectively.  Fl2 will be initiated and placed on chart for MD's signature.  Per MD- hopefully will be stable for d/c tomorrow.    Employment status:  Retired Nurse, adult PT Recommendations:  [place] / Referral to community resources:  [place]  Patient/Family's Response to care: Patient is alert, oriented and engaged in assessment. She states she wants to feel  better.  Patient/Family's Understanding of and Emotional Response to Diagnosis, Current Treatment, and Prognosis:  Patient is hopeful that short term rehab will help her to be able to remain at home a while longer. She values her independence and is saddened with the thought of losing this.  She states that while her siblings are supportive- they are only able to check on her occasionally.    Emotional Assessment Appearance:  Appears stated age Attitude/Demeanor/Rapport:   (engaged and accepting of CSW assistance, relaxed) Affect (typically observed):  Calm, Quiet, Pleasant, Accepting Orientation:  Oriented to Self, Oriented to Place, Oriented to  Time, Oriented to Situation Alcohol / Substance use:  Never Used Psych involvement (Current and /or in the community):  No (Comment)  Discharge Needs  Concerns to be addressed:  Care Coordination Readmission within the last 30 days:  Yes Current discharge risk:  Chronically ill, Dependent with Mobility, Lives alone Barriers to Discharge:  Continued Medical Work up   Kendell Bane T, LCSW 04/28/2015, 5:30 PM

## 2015-04-29 LAB — BASIC METABOLIC PANEL
Anion gap: 10 (ref 5–15)
BUN: 46 mg/dL — AB (ref 6–20)
CO2: 20 mmol/L — ABNORMAL LOW (ref 22–32)
CREATININE: 3.68 mg/dL — AB (ref 0.44–1.00)
Calcium: 9.7 mg/dL (ref 8.9–10.3)
Chloride: 107 mmol/L (ref 101–111)
GFR calc Af Amer: 14 mL/min — ABNORMAL LOW (ref >60)
GFR, EST NON AFRICAN AMERICAN: 12 mL/min — AB (ref >60)
GLUCOSE: 87 mg/dL (ref 65–99)
POTASSIUM: 4 mmol/L (ref 3.5–5.1)
SODIUM: 137 mmol/L (ref 135–145)

## 2015-04-29 LAB — VITAMIN D 1,25 DIHYDROXY
Vitamin D 1, 25 (OH)2 Total: 67 pg/mL
Vitamin D2 1, 25 (OH)2: <10 pg/mL
Vitamin D3 1, 25 (OH)2: 57 pg/mL

## 2015-04-29 LAB — GLUCOSE, CAPILLARY
GLUCOSE-CAPILLARY: 86 mg/dL (ref 65–99)
Glucose-Capillary: 87 mg/dL (ref 65–99)

## 2015-04-29 LAB — PROTIME-INR
INR: 2.97 — AB (ref 0.00–1.49)
Prothrombin Time: 30.4 seconds — ABNORMAL HIGH (ref 11.6–15.2)

## 2015-04-29 MED ORDER — FUROSEMIDE 40 MG PO TABS: 40.0000 mg | ORAL_TABLET | Freq: Every day | ORAL | Status: DC

## 2015-04-29 NOTE — Progress Notes (Signed)
Patient picked up by [facility] for transportation to [facility]. Patient's brother at the bedside. - Roselyn Reef [place],RN

## 2015-04-29 NOTE — Progress Notes (Signed)
CSW (Clinical Education officer, museum) confirmed with pt and pt sister [name] they would like to accept bed at [facility] working out Energy manager. Pt sister to do paperwork at 2pm. CSW will arrange transport after this is all confirmed and completed.  [place], [place]

## 2015-04-29 NOTE — Progress Notes (Signed)
Inpatient [facility] RM 3E 28 [facility] RN Visit-Stacie Delice Lesch RN, BSN  This is a related admission to [facility] DX: Heart Disease. Patient is a DNR. Pt. sitting in recliner when Probation officer arrived. Patient continues on O2 at 4 L n/c.She denies any pain today but does report increased nausea and reports she is unsure if she has been given medicine for this today. Roselyn Reef, staff RN consulted and will go in and assess her sx. Roselyn Reef reports pt. will be discharged for rehab later today. Pt. states she walked some with PT today and is frustrated that she cannot just " get up and do things." She has O2 in her home and is hoping that she can go back home in a week. Pt. reassured. Please call with any questions.  [facility] Liaison [phone]

## 2015-04-29 NOTE — Progress Notes (Signed)
RM 3E 42  [place]                                 MSW Note:  Pt was feeling much anxiety about her current status and going into a SNF. She states that her bowel issues are the biggest problem as with this issue comes the loss of her dignity and independence. Actively listened and provided support. Discussed revoking hospice services for insurance to cover rehab at the SNF. She reviewed and signed the revocation forms.Benedict Needy [place],LCSW

## 2015-04-29 NOTE — Clinical Social Work Placement (Signed)
   CLINICAL SOCIAL WORK PLACEMENT  NOTE  Date:  04/29/2015  Patient Details  Name: [name] MRN: [id number] Date of Birth: Apr 13, 1946  Clinical Social Work is seeking post-discharge placement for this patient at the [place] level of care (*CSW will initial, date and re-position this form in  chart as items are completed):  Yes   Patient/family provided with [place] Work Department's list of facilities offering this level of care within the geographic area requested by the patient (or if unable, by the patient's family).  Yes   Patient/family informed of their freedom to choose among providers that offer the needed level of care, that participate in Medicare, Medicaid or managed care program needed by the patient, have an available bed and are willing to accept the patient.  Yes   Patient/family informed of [facility]'s ownership interest in [facility] and [facility], as well as of the fact that they are under no obligation to receive care at these facilities.  PASRR submitted to EDS on 04/28/15     PASRR number received on 04/28/15     Existing PASRR number confirmed on       FL2 transmitted to all facilities in geographic area requested by pt/family on 04/28/15     FL2 transmitted to all facilities within larger geographic area on       Patient informed that his/her managed care company has contracts with or will negotiate with certain facilities, including the following:        Yes   Patient/family informed of bed offers received.  Patient chooses bed at [facility]     Physician recommends and patient chooses bed at      Patient to be transferred to [facility] on 04/29/15.  Patient to be transferred to facility by Ambulance [name])     Patient family notified on 04/29/15 of transfer.  Name of family member notified:  Anne Ng (sister)     PHYSICIAN       Additional CommentBerton Mount, LCSW [phone]

## 2015-04-29 NOTE — Progress Notes (Signed)
Physical Therapy Treatment Patient Details Name: [name] MRN: [id number] DOB: Mar 27, 1946 Today's Date: 04/29/2015    History of Present Illness Pt. admitted 04/24/15 with AMS, encephalopathy, hypercalcemia, lactic acidosis  metabolic acidosis, acute renal failure on CKD IV, .  Pt. 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  Pt is being followed by Hospice at home due to heart failure    PT Comments    Pt admitted with above diagnosis. Pt currently with functional limitations due to balance and endurance deficits. Pt ambulates well overall with need for min guard assist.  Pt very anxious at times needing cues and assist.  Still feel a brief NH stay would be beneficial.   Pt will benefit from skilled PT to increase their independence and safety with mobility to allow discharge to the venue listed below.    Follow Up Recommendations  SNF;Supervision/Assistance - 24 hour;Other (comment) (unless pt. becomes appropriate for residential hospice)     Equipment Recommendations  None recommended by PT    Recommendations for Other Services       Precautions / Restrictions Precautions Precautions: Fall Restrictions Weight Bearing Restrictions: No    Mobility  Bed Mobility Overal bed mobility: Modified Independent             General bed mobility comments: Pt. uses bed rail to assist self in supine to sit and in scooting up to [place] Overall transfer level: Needs assistance Equipment used: Rolling walker (2 wheeled) Transfers: Sit to/from Stand Sit to Stand: Min guard         General transfer comment: Pt able to power up.  Slightly shaky in general but no  signficant LOB.  STeadying assist needed only.    Ambulation/Gait Ambulation/Gait assistance: Min guard Ambulation Distance (Feet): 70 Feet Assistive device: Rolling walker (2 wheeled) Gait Pattern/deviations: Step-through pattern;Decreased  stride length;Wide base of support;Drifts right/left   Gait velocity interpretation: Below normal speed for age/gender General Gait Details: Needed asssit steering RW.  No LOB with ambulation.    Stairs            Wheelchair Mobility    Modified Rankin (Stroke Patients Only)       Balance Overall balance assessment: Needs assistance Sitting-balance support: No upper extremity supported;Feet supported Sitting balance-Leahy Scale: Fair     Standing balance support: Bilateral upper extremity supported;During functional activity Standing balance-Leahy Scale: Poor Standing balance comment: needs UE support for stability.                     Cognition Arousal/Alertness: Awake/alert Behavior During Therapy: WFL for tasks assessed/performed Overall Cognitive Status: No family/caregiver present to determine baseline cognitive functioning                      Exercises General Exercises - Lower Extremity Ankle Circles/Pumps: AROM;Both;5 reps;Supine Long Arc Quad: AROM;Both;10 reps;Seated    General Comments        Pertinent Vitals/Pain Pain Assessment: No/denies pain  VSS on 4LO2 throughout ambulation .    Home Living                      Prior Function            PT Goals (current goals can now be found in the care plan section) Progress towards PT goals: Progressing toward goals    Frequency  Min 3X/week    PT Plan  Current plan remains appropriate    Co-evaluation             End of Session Equipment Utilized During Treatment: Gait belt;Oxygen Activity Tolerance: Patient limited by fatigue Patient left: with call bell/phone within reach;in chair;with chair alarm set     Time: 8889-1694 PT Time Calculation (min) (ACUTE ONLY): 19 min  Charges:  $Gait Training: 8-22 mins                    G Codes:      [name] 2015-05-10, 11:25 AM [name] Acute Rehabilitation [phone] (pager)

## 2015-04-29 NOTE — Progress Notes (Addendum)
CSW (Clinical Education officer, museum) prepared pt dc packet and placed with shadow chart. CSW arranged non-emergent ambulance transport. Pt, pt family, pt nurse, and facility informed. Facility aware that hospice was revoked. CSW faxed this paperwork to facility CSW signing off.  [place], [place]

## 2015-04-30 LAB — PTH-RELATED PEPTIDE: PTH-related peptide: <1.1 pmol/L

## 2015-05-05 ENCOUNTER — Encounter: Payer: Self-pay | Admitting: Family Medicine

## 2015-05-26 ENCOUNTER — Ambulatory Visit (INDEPENDENT_AMBULATORY_CARE_PROVIDER_SITE_OTHER): Admitting: Cardiovascular Disease

## 2015-05-26 DIAGNOSIS — I213 ST elevation (STEMI) myocardial infarction of unspecified site: Secondary | ICD-10-CM

## 2015-05-26 DIAGNOSIS — Z5181 Encounter for therapeutic drug level monitoring: Secondary | ICD-10-CM

## 2015-05-26 DIAGNOSIS — I513 Intracardiac thrombosis, not elsewhere classified: Secondary | ICD-10-CM

## 2015-05-26 DIAGNOSIS — I214 Non-ST elevation (NSTEMI) myocardial infarction: Secondary | ICD-10-CM

## 2015-05-26 LAB — POCT INR: INR: 3.2

## 2015-05-30 ENCOUNTER — Ambulatory Visit (INDEPENDENT_AMBULATORY_CARE_PROVIDER_SITE_OTHER): Admitting: Cardiovascular Disease

## 2015-05-30 DIAGNOSIS — I213 ST elevation (STEMI) myocardial infarction of unspecified site: Secondary | ICD-10-CM

## 2015-05-30 DIAGNOSIS — Z5181 Encounter for therapeutic drug level monitoring: Secondary | ICD-10-CM

## 2015-05-30 DIAGNOSIS — I214 Non-ST elevation (NSTEMI) myocardial infarction: Secondary | ICD-10-CM

## 2015-05-30 DIAGNOSIS — I513 Intracardiac thrombosis, not elsewhere classified: Secondary | ICD-10-CM

## 2015-05-30 LAB — POCT INR: INR: 2.4

## 2015-06-06 ENCOUNTER — Ambulatory Visit (INDEPENDENT_AMBULATORY_CARE_PROVIDER_SITE_OTHER): Admitting: Internal Medicine

## 2015-06-06 DIAGNOSIS — I213 ST elevation (STEMI) myocardial infarction of unspecified site: Secondary | ICD-10-CM

## 2015-06-06 DIAGNOSIS — I513 Intracardiac thrombosis, not elsewhere classified: Secondary | ICD-10-CM

## 2015-06-06 DIAGNOSIS — Z5181 Encounter for therapeutic drug level monitoring: Secondary | ICD-10-CM

## 2015-06-06 DIAGNOSIS — I214 Non-ST elevation (NSTEMI) myocardial infarction: Secondary | ICD-10-CM

## 2015-06-06 LAB — POCT INR: INR: 7.9

## 2015-06-09 ENCOUNTER — Ambulatory Visit (INDEPENDENT_AMBULATORY_CARE_PROVIDER_SITE_OTHER): Admitting: Cardiology

## 2015-06-09 DIAGNOSIS — I513 Intracardiac thrombosis, not elsewhere classified: Secondary | ICD-10-CM

## 2015-06-09 DIAGNOSIS — I214 Non-ST elevation (NSTEMI) myocardial infarction: Secondary | ICD-10-CM

## 2015-06-09 DIAGNOSIS — Z5181 Encounter for therapeutic drug level monitoring: Secondary | ICD-10-CM

## 2015-06-09 DIAGNOSIS — I213 ST elevation (STEMI) myocardial infarction of unspecified site: Secondary | ICD-10-CM

## 2015-06-09 LAB — POCT INR: INR: 3.1

## 2015-06-13 ENCOUNTER — Emergency Department (HOSPITAL_COMMUNITY)

## 2015-06-13 ENCOUNTER — Inpatient Hospital Stay (HOSPITAL_COMMUNITY)
Admission: EM | Admit: 2015-06-13 | Discharge: 2015-06-15 | DRG: 640 | Disposition: A | Discharge status: Hospice | Source: Hospice | Attending: Internal Medicine | Admitting: Internal Medicine

## 2015-06-13 ENCOUNTER — Telehealth: Payer: Self-pay | Admitting: Internal Medicine

## 2015-06-13 ENCOUNTER — Encounter (HOSPITAL_COMMUNITY): Payer: Self-pay

## 2015-06-13 DIAGNOSIS — E860 Dehydration: Secondary | ICD-10-CM | POA: Diagnosis present

## 2015-06-13 DIAGNOSIS — W19XXXA Unspecified fall, initial encounter: Secondary | ICD-10-CM | POA: Diagnosis present

## 2015-06-13 DIAGNOSIS — E876 Hypokalemia: Secondary | ICD-10-CM | POA: Diagnosis present

## 2015-06-13 DIAGNOSIS — N184 Chronic kidney disease, stage 4 (severe): Secondary | ICD-10-CM | POA: Diagnosis not present

## 2015-06-13 DIAGNOSIS — Z794 Long term (current) use of insulin: Secondary | ICD-10-CM

## 2015-06-13 DIAGNOSIS — R1084 Generalized abdominal pain: Secondary | ICD-10-CM | POA: Diagnosis not present

## 2015-06-13 DIAGNOSIS — R109 Unspecified abdominal pain: Secondary | ICD-10-CM | POA: Diagnosis present

## 2015-06-13 DIAGNOSIS — I132 Hypertensive heart and chronic kidney disease with heart failure and with stage 5 chronic kidney disease, or end stage renal disease: Secondary | ICD-10-CM | POA: Diagnosis present

## 2015-06-13 DIAGNOSIS — M109 Gout, unspecified: Secondary | ICD-10-CM | POA: Diagnosis present

## 2015-06-13 DIAGNOSIS — I2510 Atherosclerotic heart disease of native coronary artery without angina pectoris: Secondary | ICD-10-CM | POA: Diagnosis present

## 2015-06-13 DIAGNOSIS — E785 Hyperlipidemia, unspecified: Secondary | ICD-10-CM | POA: Diagnosis present

## 2015-06-13 DIAGNOSIS — G4733 Obstructive sleep apnea (adult) (pediatric): Secondary | ICD-10-CM | POA: Diagnosis present

## 2015-06-13 DIAGNOSIS — Z9981 Dependence on supplemental oxygen: Secondary | ICD-10-CM

## 2015-06-13 DIAGNOSIS — M1990 Unspecified osteoarthritis, unspecified site: Secondary | ICD-10-CM | POA: Diagnosis present

## 2015-06-13 DIAGNOSIS — E440 Moderate protein-calorie malnutrition: Secondary | ICD-10-CM | POA: Diagnosis present

## 2015-06-13 DIAGNOSIS — G9341 Metabolic encephalopathy: Secondary | ICD-10-CM | POA: Diagnosis present

## 2015-06-13 DIAGNOSIS — R627 Adult failure to thrive: Principal | ICD-10-CM | POA: Diagnosis present

## 2015-06-13 DIAGNOSIS — K219 Gastro-esophageal reflux disease without esophagitis: Secondary | ICD-10-CM | POA: Diagnosis present

## 2015-06-13 DIAGNOSIS — I5042 Chronic combined systolic (congestive) and diastolic (congestive) heart failure: Secondary | ICD-10-CM | POA: Diagnosis present

## 2015-06-13 DIAGNOSIS — E861 Hypovolemia: Secondary | ICD-10-CM | POA: Diagnosis present

## 2015-06-13 DIAGNOSIS — R531 Weakness: Secondary | ICD-10-CM | POA: Diagnosis not present

## 2015-06-13 DIAGNOSIS — J449 Chronic obstructive pulmonary disease, unspecified: Secondary | ICD-10-CM | POA: Diagnosis present

## 2015-06-13 DIAGNOSIS — N186 End stage renal disease: Secondary | ICD-10-CM | POA: Diagnosis present

## 2015-06-13 DIAGNOSIS — I10 Essential (primary) hypertension: Secondary | ICD-10-CM | POA: Diagnosis present

## 2015-06-13 DIAGNOSIS — Z515 Encounter for palliative care: Secondary | ICD-10-CM | POA: Insufficient documentation

## 2015-06-13 DIAGNOSIS — I272 Other secondary pulmonary hypertension: Secondary | ICD-10-CM | POA: Diagnosis present

## 2015-06-13 DIAGNOSIS — G9340 Encephalopathy, unspecified: Secondary | ICD-10-CM | POA: Diagnosis present

## 2015-06-13 DIAGNOSIS — I5022 Chronic systolic (congestive) heart failure: Secondary | ICD-10-CM | POA: Diagnosis present

## 2015-06-13 DIAGNOSIS — Z66 Do not resuscitate: Secondary | ICD-10-CM | POA: Diagnosis present

## 2015-06-13 DIAGNOSIS — G8929 Other chronic pain: Secondary | ICD-10-CM | POA: Diagnosis present

## 2015-06-13 DIAGNOSIS — E1122 Type 2 diabetes mellitus with diabetic chronic kidney disease: Secondary | ICD-10-CM | POA: Diagnosis present

## 2015-06-13 DIAGNOSIS — K589 Irritable bowel syndrome without diarrhea: Secondary | ICD-10-CM | POA: Diagnosis present

## 2015-06-13 DIAGNOSIS — F0390 Unspecified dementia without behavioral disturbance: Secondary | ICD-10-CM | POA: Diagnosis present

## 2015-06-13 DIAGNOSIS — E118 Type 2 diabetes mellitus with unspecified complications: Secondary | ICD-10-CM | POA: Diagnosis present

## 2015-06-13 LAB — BASIC METABOLIC PANEL
Anion gap: 15 (ref 5–15)
BUN: 28 mg/dL — ABNORMAL HIGH (ref 6–20)
CO2: 23 mmol/L (ref 22–32)
Calcium: 12.2 mg/dL — ABNORMAL HIGH (ref 8.9–10.3)
Chloride: 102 mmol/L (ref 101–111)
Creatinine, Ser: 3.06 mg/dL — ABNORMAL HIGH (ref 0.44–1.00)
GFR calc Af Amer: 17 mL/min — ABNORMAL LOW (ref >60)
GFR calc non Af Amer: 15 mL/min — ABNORMAL LOW (ref >60)
Glucose, Bld: 221 mg/dL — ABNORMAL HIGH (ref 65–99)
Potassium: 3.9 mmol/L (ref 3.5–5.1)
Sodium: 140 mmol/L (ref 135–145)

## 2015-06-13 LAB — CBC WITH DIFFERENTIAL/PLATELET
Basophils Absolute: 0 10*3/uL (ref 0.0–0.1)
Basophils Relative: 0 %
Eosinophils Absolute: 0 10*3/uL (ref 0.0–0.7)
Eosinophils Relative: 0 %
HCT: 53.5 % — ABNORMAL HIGH (ref 36.0–46.0)
Hemoglobin: 18.4 g/dL — ABNORMAL HIGH (ref 12.0–15.0)
Lymphocytes Relative: 13 %
Lymphs Abs: 1.9 10*3/uL (ref 0.7–4.0)
MCH: 30.9 pg (ref 26.0–34.0)
MCHC: 34.4 g/dL (ref 30.0–36.0)
MCV: 89.8 fL (ref 78.0–100.0)
Monocytes Absolute: 1 10*3/uL (ref 0.1–1.0)
Monocytes Relative: 7 %
Neutro Abs: 11.3 10*3/uL — ABNORMAL HIGH (ref 1.7–7.7)
Neutrophils Relative %: 80 %
Platelets: 398 10*3/uL (ref 150–400)
RBC: 5.96 MIL/uL — ABNORMAL HIGH (ref 3.87–5.11)
RDW: 12.5 % (ref 11.5–15.5)
WBC: 14.2 10*3/uL — ABNORMAL HIGH (ref 4.0–10.5)

## 2015-06-13 LAB — CK: Total CK: 61 U/L (ref 38–234)

## 2015-06-13 LAB — PROTIME-INR
INR: 3.82 — ABNORMAL HIGH (ref 0.00–1.49)
PROTHROMBIN TIME: 36.7 s — AB (ref 11.6–15.2)

## 2015-06-13 LAB — URINE MICROSCOPIC-ADD ON

## 2015-06-13 LAB — URINALYSIS, ROUTINE W REFLEX MICROSCOPIC
Bilirubin Urine: NEGATIVE
Glucose, UA: 500 mg/dL — AB
Ketones, ur: 15 mg/dL — AB
Leukocytes, UA: NEGATIVE
Nitrite: NEGATIVE
Protein, ur: >300 mg/dL — AB
Specific Gravity, Urine: 1.019 (ref 1.005–1.030)
Urobilinogen, UA: 0.2 mg/dL (ref 0.0–1.0)
pH: 7 (ref 5.0–8.0)

## 2015-06-13 MED ORDER — METOCLOPRAMIDE HCL 5 MG/ML IJ SOLN
10.0000 mg | Freq: Once | INTRAMUSCULAR | Status: AC
  Administered 2015-06-13: 10 mg via INTRAVENOUS
  Filled 2015-06-13: qty 2

## 2015-06-13 MED ORDER — ATORVASTATIN CALCIUM 20 MG PO TABS
20.0000 mg | ORAL_TABLET | Freq: Every day | ORAL | Status: DC
  Administered 2015-06-13: 20 mg via ORAL
  Filled 2015-06-13: qty 1

## 2015-06-13 MED ORDER — SODIUM CHLORIDE 0.9 % IV BOLUS (SEPSIS): 500.0000 mL | Freq: Once | INTRAVENOUS | Status: DC

## 2015-06-13 MED ORDER — ONDANSETRON HCL 4 MG PO TABS: 4.0000 mg | ORAL_TABLET | Freq: Four times a day (QID) | ORAL | Status: DC | PRN

## 2015-06-13 MED ORDER — SODIUM CHLORIDE 0.9 % IJ SOLN
3.0000 mL | Freq: Two times a day (BID) | INTRAMUSCULAR | Status: DC
  Administered 2015-06-13 – 2015-06-14 (×2): 3 mL via INTRAVENOUS

## 2015-06-13 MED ORDER — HYDROMORPHONE HCL 1 MG/ML IJ SOLN: 0.5000 mg | INTRAMUSCULAR | Status: DC | PRN

## 2015-06-13 MED ORDER — SODIUM CHLORIDE 0.9 % IV BOLUS (SEPSIS)
500.0000 mL | Freq: Once | INTRAVENOUS | Status: AC
  Administered 2015-06-13: 500 mL via INTRAVENOUS

## 2015-06-13 MED ORDER — CARVEDILOL 3.125 MG PO TABS
3.1250 mg | ORAL_TABLET | Freq: Two times a day (BID) | ORAL | Status: DC
  Filled 2015-06-13 (×2): qty 1

## 2015-06-13 MED ORDER — BOOST / RESOURCE BREEZE PO LIQD
1.0000 | Freq: Three times a day (TID) | ORAL | Status: DC
  Administered 2015-06-14: 1 via ORAL

## 2015-06-13 MED ORDER — SODIUM CHLORIDE 0.9 % IJ SOLN: 3.0000 mL | INTRAMUSCULAR | Status: DC | PRN

## 2015-06-13 MED ORDER — ALUM & MAG HYDROXIDE-SIMETH 200-200-20 MG/5ML PO SUSP: 30.0000 mL | Freq: Four times a day (QID) | ORAL | Status: DC | PRN

## 2015-06-13 MED ORDER — WARFARIN SODIUM 2 MG PO TABS: 2.0000 mg | ORAL_TABLET | Freq: Every day | ORAL | Status: DC

## 2015-06-13 MED ORDER — ONDANSETRON HCL 4 MG PO TABS: 4.0000 mg | ORAL_TABLET | Freq: Three times a day (TID) | ORAL | Status: DC | PRN

## 2015-06-13 MED ORDER — PROMETHAZINE HCL 25 MG/ML IJ SOLN: 12.5000 mg | INTRAMUSCULAR | Status: DC | PRN

## 2015-06-13 MED ORDER — SODIUM BICARBONATE 650 MG PO TABS
650.0000 mg | ORAL_TABLET | Freq: Two times a day (BID) | ORAL | Status: DC
  Administered 2015-06-13 – 2015-06-14 (×2): 650 mg via ORAL
  Filled 2015-06-13 (×2): qty 1

## 2015-06-13 MED ORDER — FAMOTIDINE 20 MG PO TABS
20.0000 mg | ORAL_TABLET | Freq: Every day | ORAL | Status: DC
  Administered 2015-06-14: 20 mg via ORAL
  Filled 2015-06-13: qty 1

## 2015-06-13 MED ORDER — ALBUTEROL SULFATE (2.5 MG/3ML) 0.083% IN NEBU: 2.5000 mg | INHALATION_SOLUTION | RESPIRATORY_TRACT | Status: AC | PRN

## 2015-06-13 MED ORDER — ACETAMINOPHEN 325 MG PO TABS: 650.0000 mg | ORAL_TABLET | Freq: Four times a day (QID) | ORAL | Status: DC | PRN

## 2015-06-13 MED ORDER — IPRATROPIUM-ALBUTEROL 0.5-2.5 (3) MG/3ML IN SOLN
3.0000 mL | Freq: Four times a day (QID) | RESPIRATORY_TRACT | Status: DC
  Administered 2015-06-13 – 2015-06-14 (×2): 3 mL via RESPIRATORY_TRACT
  Filled 2015-06-13 (×2): qty 3

## 2015-06-13 MED ORDER — SODIUM CHLORIDE 0.9 % IV SOLN: 250.0000 mL | INTRAVENOUS | Status: DC | PRN

## 2015-06-13 MED ORDER — ACETAMINOPHEN 650 MG RE SUPP: 650.0000 mg | Freq: Four times a day (QID) | RECTAL | Status: DC | PRN

## 2015-06-13 MED ORDER — PANCRELIPASE (LIP-PROT-AMYL) 12000-38000 UNITS PO CPEP
24000.0000 [IU] | ORAL_CAPSULE | Freq: Every day | ORAL | Status: DC
  Administered 2015-06-14: 24000 [IU] via ORAL
  Filled 2015-06-13: qty 2

## 2015-06-13 MED ORDER — HYDROMORPHONE HCL 1 MG/ML IJ SOLN: 1.0000 mg | INTRAMUSCULAR | Status: DC | PRN

## 2015-06-13 MED ORDER — INSULIN ASPART 100 UNIT/ML ~~LOC~~ SOLN
0.0000 [IU] | SUBCUTANEOUS | Status: DC
  Administered 2015-06-14 (×2): 3 [IU] via SUBCUTANEOUS

## 2015-06-13 MED ORDER — ONDANSETRON HCL 4 MG/2ML IJ SOLN: 4.0000 mg | Freq: Three times a day (TID) | INTRAMUSCULAR | Status: DC | PRN

## 2015-06-13 MED ORDER — ONDANSETRON HCL 4 MG/2ML IJ SOLN
4.0000 mg | Freq: Four times a day (QID) | INTRAMUSCULAR | Status: DC | PRN
  Administered 2015-06-13 – 2015-06-14 (×2): 4 mg via INTRAVENOUS
  Filled 2015-06-13 (×2): qty 2

## 2015-06-13 NOTE — ED Notes (Signed)
PA made aware of vital signs

## 2015-06-13 NOTE — H&P (Addendum)
[facility] Admission History and Physical       [place] PU:[id number] DOB: 1946-02-13 DOA: 06/13/2015  Referring physician: EDP PCP: Antony Blackbird, MD  Specialists:   Chief Complaint: Weakness  HPI: [name] is a 69 y.o. female with Multiple Medical Problems including ESRD not on Dialysis Rx, and Sarcoidosis and DM2 who was brought to the ED by her family after a Fall last night in her home. PEr her Family, she has had increased weakness and has had continued decline and poor intake of foods and liquids over the past week.  She has also been less interactive and more confused.    Options were discussed and Social work was consulted and [name]  And her son agreed to placement at the [facility] for [facility].   She was referred for admission to facilitate placement at [place].        Review of Systems: Unable to Obtain from the Patient  Past Medical History  Diagnosis Date  . Essential hypertension, benign   . Coronary atherosclerosis of native coronary artery 01/04/2006    Tiny OM1 70-90% ostial stenosis, no other CAD  . COPD (chronic obstructive pulmonary disease) ([place])   . Esophageal reflux   . Dyslipidemia   . Gout   . Colon polyps 2011    Tubular adenomatous polyps  . Spinal stenosis of lumbar region     chronic low back pain.   . Bulging lumbar disc   . Chronic diastolic heart failure ([place] [address])   . PNA (pneumonia) 2012    With pleural effusion, requiring thoracentesis  . Chronic bronchitis ([place])   . On home oxygen therapy   . OSA (obstructive sleep apnea)     Marginally compliant with CPAP  . Type II diabetes mellitus ([place])   . Arthritis   . Diverticulosis   . GERD (gastroesophageal reflux disease)   . Anxiety   . Hyperlipidemia   . Sarcoidosis of lung ([place])   . CKD (chronic kidney disease) stage 4, GFR 15-29 ml/min (HCC)   . IBS (irritable bowel syndrome)   . Cataracts, both eyes   . Headache(784.0)   . Lactic acidosis  01/21/2015  . Chronic CHF (congestive heart failure) [facility])      Past Surgical History  Procedure Laterality Date  . Vesicovaginal fistula closure w/  total abdominal hysterectomy  1992  . Bilateral total knee replacements Bilateral Rt=5/04 & Lft=1/09    by DrAlusio  . 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 Zella Richer  . Appendectomy  1957  . Tonsillectomy  1968  . Cholecystectomy  1980's  . Abdominal hysterectomy  1992  . Reduction mammaplasty Bilateral 1980's  . Cardiac catheterization  1990's  . Thoracentesis  2012  . Joint replacement    . Av fistula placement Left 12/31/2013    Procedure: ARTERIOVENOUS (AV) FISTULA CREATION;  Surgeon: Elam Dutch, MD;  Location: [facility];  Service: Vascular;  Laterality: Left;  . Colonoscopy w/ biopsies and polypectomy    . Av fistula placement Left 03/18/2014    Procedure: ARTERIOVENOUS (AV) FISTULA CREATION- LEFT BRACHIOCEPHALIC ;  Surgeon: Elam Dutch, MD;  Location[facility];  Service: Vascular;  Laterality: Left;  . Ligation of arteriovenous  fistula Left 03/18/2014    Procedure: LIGATION OF ARTERIOVENOUS  FISTULA- LEFT RADIOCEPHALIC;  Surgeon: Elam Dutch, MD;  Location: [place];  Service: Vascular;  Laterality: Left;  . Cardiac catheterization N/A 01/27/2015    Procedure:  Right Heart Cath;  Surgeon: Larey Dresser, MD;  Location: [facility] CV LAB;  Service: Cardiovascular;  Laterality: N/A;  . Esophagogastroduodenoscopy (egd) with propofol N/A 03/14/2015    Procedure: ESOPHAGOGASTRODUODENOSCOPY (EGD) WITH PROPOFOL;  Surgeon: Gatha Mayer, MD;  Location: [place];  Service: Endoscopy;  Laterality: N/A;  . Colonoscopy with propofol N/A 03/14/2015    Procedure: COLONOSCOPY WITH PROPOFOL;  Surgeon: Gatha Mayer, MD;  Location: [facility];  Service: Endoscopy;  Laterality: N/A;      Prior to Admission medications   Medication Sig Start Date End Date Taking? Authorizing Provider  acetaminophen (TYLENOL) 500 MG tablet Take 1,000 mg  by mouth 2 (two) times daily as needed (pain).    Yes Historical Provider, MD  albuterol (PROVENTIL HFA;VENTOLIN HFA) 108 (90 BASE) MCG/ACT inhaler Inhale 2 puffs into the lungs every 4 (four) hours as needed for wheezing or shortness of breath.    Yes Historical Provider, MD  albuterol (PROVENTIL) (2.5 MG/3ML) 0.083% nebulizer solution Take 2.5 mg by nebulization every 2 (two) hours as needed for wheezing or shortness of breath.   Yes Historical Provider, MD  atorvastatin (LIPITOR) 20 MG tablet Take 20 mg by mouth at bedtime.   Yes Historical Provider, MD  carvedilol (COREG) 3.125 MG tablet Take 1 tablet (3.125 mg total) by mouth 2 (two) times daily with a meal. 04/06/15  Yes Robbie Lis, MD  esomeprazole (NEXIUM) 40 MG capsule Take 1 capsule by mouth 30 min before breakfast. Patient taking differently: Take 40 mg by mouth daily.  03/03/15  Yes Amy S Esterwood, PA-C  famotidine (PEPCID) 20 MG tablet Take 1 tablet (20 mg total) by mouth daily. 02/02/15  Yes Ripudeep Krystal Eaton, MD  feeding supplement (BOOST / RESOURCE BREEZE) LIQD Take 1 Container by mouth 3 (three) times daily between meals. 03/06/15  Yes Shanker Kristeen Mans, MD  Fluticasone-Salmeterol (ADVAIR) 250-50 MCG/DOSE AEPB Inhale 1 puff into the lungs 2 (two) times daily. 09/25/14  Yes Noralee Space, MD  hydrocortisone (ANUSOL-HC) 2.5 % rectal cream Place 1 application rectally 4 (four) times daily. 02/28/15  Yes Amy S Esterwood, PA-C  HYDROmorphone (DILAUDID) 2 MG tablet Take 2-4 mg by mouth every 4 (four) hours as needed for moderate pain or severe pain.   Yes Historical Provider, MD  insulin lispro (HUMALOG KWIKPEN) 100 UNIT/ML KiwkPen Inject 0.01-0.1 mLs (1-10 Units total) into the skin 3 (three) times daily. Sliding scale CBG 70 - 120: 0 units CBG 121 - 150: 1 unit,  CBG 151 - 200: 2 units,  CBG 201 - 250: 3 units,  CBG 251 - 300: 5 units,  CBG 301 - 350: 7 units,  CBG 351 - 400: 9 units   CBG > 400: 10 units and notify your MD 02/02/15  Yes Ripudeep K  Rai, MD  ondansetron (ZOFRAN) 4 MG tablet Take 1 tablet (4 mg total) by mouth every 8 (eight) hours as needed for nausea or vomiting. 04/06/15  Yes Robbie Lis, MD  Pancrelipase, Lip-Prot-Amyl, (CREON) 24000 UNITS CPEP Take 24,000 Units by mouth daily.   Yes Historical Provider, MD  promethazine (PHENERGAN) 25 MG tablet Take 1 tablet (25 mg total) by mouth every 4 (four) hours as needed for nausea or vomiting. 02/02/15  Yes Ripudeep Krystal Eaton, MD  sodium bicarbonate 650 MG tablet Take 1 tablet (650 mg total) by mouth 2 (two) times daily. 02/02/15  Yes Ripudeep Krystal Eaton, MD  warfarin (COUMADIN) 1 MG tablet Take 2 mg  by mouth daily.   Yes Historical Provider, MD  ferrous sulfate 325 (65 FE) MG tablet Take 325 mg by mouth daily with breakfast.    Historical Provider, MD  HYDROcodone-acetaminophen (NORCO/VICODIN) 5-325 MG per tablet Take 1 tablet by mouth every 4 (four) hours as needed for moderate pain. 04/28/15   Maryann Mikhail, DO  warfarin (COUMADIN) 1 MG tablet Take 1.5 tablets (1.5 mg total) by mouth every evening. 04/06/15   Robbie Lis, MD     Allergies  Allergen Reactions  . Adhesive [Tape] Other (See Comments)    Blisters   . Avelox [Moxifloxacin Hcl In Nacl] Other (See Comments)    GI upset  . Codeine Other (See Comments)    "crazy"   . Guaifenesin Nausea And Vomiting    Takes Mucinex at home without issue  . Latex Swelling  . Oxycodone Nausea And Vomiting    Takes Percocet at home without issue    Social History:  reports that she has never smoked. She has never used smokeless tobacco. She reports that she does not drink alcohol or use illicit drugs.    Family History  Problem Relation Age of Onset  . Diabetes Mother   . Heart disease Mother   . Hyperlipidemia Mother   . Varicose Veins Mother   . Breast cancer Maternal Aunt   . Heart disease Father   . Deep vein thrombosis Father   . Hyperlipidemia Father   . Heart disease Sister     before age 45  . Cancer Sister   .  Diabetes Sister   . Hyperlipidemia Sister   . Heart attack Sister   . Heart disease Brother   . Diabetes Brother   . Hyperlipidemia Brother   . Heart attack Brother   . Hyperlipidemia Sister        Physical Exam:  GEN:  Pleasant ill Appearing Elderly  69 y.o. African American female examined and in no acute distress; cooperative with exam Filed Vitals:   06/13/15 1845 06/13/15 1915 06/13/15 1930 06/13/15 1945  BP: 212/117 212/119 207/124 227/106  Pulse: 103 105 103 64  Temp:      TempSrc:      Resp: 24 21 26 31   SpO2: 100% 100% 100% 100%   Blood pressure 227/106, pulse 64, temperature 97.5 F (36.4 C), temperature source Oral, resp. rate 31, SpO2 100 %. PSYCH: SHe is alert and oriented x4; does not appear anxious does not appear depressed; affect is normal HEENT: Normocephalic and Atraumatic, Mucous membranes pink; PERRLA; EOM intact; Fundi:  Benign;  No scleral icterus, Nares: Patent, Oropharynx: Clear, Edentulous   Neck:  FROM, No Cervical Lymphadenopathy nor Thyromegaly or Carotid Bruit; No JVD; Breasts:: Not examined CHEST WALL: No tenderness CHEST: Normal respiration, clear to auscultation bilaterally HEART: Regular rate and rhythm; no murmurs rubs or gallops BACK: No kyphosis or scoliosis; No CVA tenderness ABDOMEN: Positive Bowel Sounds, Soft Non-Tender, No Rebound or Guarding; No Masses, No Organomegaly. Rectal Exam: Not done EXTREMITIES: No Cyanosis, Clubbing, Venous Stasis Dermatitis changes of BLEs. Genitalia: not examined PULSES: 2+ and symmetric SKIN: Normal hydration no rash or ulceration CNS:  Alert and Oriented x 4, No Focal Deficits Generalized Weakness Vascular: pulses palpable throughout    Labs on Admission:  Basic Metabolic Panel:  Recent Labs Lab 06/13/15 1630  NA 140  K 3.9  CL 102  CO2 23  GLUCOSE 221*  BUN 28*  CREATININE 3.06*  CALCIUM 12.2*   Liver Function Tests: No  results for input(s): AST, ALT, ALKPHOS, BILITOT, PROT, ALBUMIN  in the last 168 hours. No results for input(s): LIPASE, AMYLASE in the last 168 hours. No results for input(s): AMMONIA in the last 168 hours. CBC:  Recent Labs Lab 06/13/15 1630  WBC 14.2*  NEUTROABS 11.3*  HGB 18.4*  HCT 53.5*  MCV 89.8  PLT 398   Cardiac Enzymes:  Recent Labs Lab 06/13/15 1630  CKTOTAL 61    BNP (last 3 results)  Recent Labs  01/21/15 1830 02/02/15 0517 02/24/15 1612  BNP 1056.8* 2924.8* 475.5*    ProBNP (last 3 results)  Recent Labs  06/24/14 0945  PROBNP 324.0*    CBG: No results for input(s): GLUCAP in the last 168 hours.  Radiological Exams on Admission: Dg Chest 2 View  06/13/2015  CLINICAL DATA:  Patient was found this morning after falling last night at 2100. Family found her this morning Alert and Oriented per baseline. Patient is a hospice patient for renal failure. Reported patient had one episode of vomiting. EXAM: CHEST  2 VIEW COMPARISON:  04/24/2015 FINDINGS: The heart is enlarged. Shallow lung inflation. There are no focal consolidations or pleural effusions. No pulmonary edema. There are small bilateral pleural effusions. Surgical clips are seen in the upper abdomen. IMPRESSION: 1. Cardiomegaly without pulmonary edema. 2. Small bilateral effusions. Electronically Signed   By: Nolon Nations M.D.   On: 06/13/2015 14:26   Dg Pelvis 1-2 Views  06/13/2015  CLINICAL DATA:  [place] last night.  Pelvic pain. EXAM: PELVIS - 1-2 VIEW COMPARISON:  CT scan 03/09/2015. FINDINGS: Both hips are normally located. No acute hip fracture or plain film evidence of avascular necrosis. The pubic symphysis and SI joints are intact. No pelvic fractures or bone lesions. Extensive vascular calcifications are noted. IMPRESSION: No acute bony findings. Electronically Signed   By: Marijo Sanes M.D.   On: 06/13/2015 14:26   Ct Head Wo Contrast  06/13/2015  CLINICAL DATA:  Recent fall last evening with headaches and neck pain EXAM: CT HEAD WITHOUT CONTRAST  CT CERVICAL SPINE WITHOUT CONTRAST TECHNIQUE: Multidetector CT imaging of the head and cervical spine was performed following the standard protocol without intravenous contrast. Multiplanar CT image reconstructions of the cervical spine were also generated. COMPARISON:  04/24/2015 FINDINGS: CT HEAD FINDINGS The bony calvarium is intact. No gross soft tissue abnormality is noted. Mild atrophic changes are seen. No findings to suggest acute hemorrhage, acute infarction or space-occupying mass lesion are noted. CT CERVICAL SPINE FINDINGS Seven cervical segments are well visualized. Vertebral body height is well maintained osteophytic changes are noted from C4-C7 with mild degrees of neural foraminal narrowing. Facet hypertrophic changes are seen without evidence of acute fracture. There is a small defect in the right lateral aspect of the C1 ring best seen on image number 21. This is chronic in nature and is been shown on multiple previous exams dating back to 2013. No gross soft tissue abnormality is noted aside from a multinodular goiter. The dominant nodule measures 14 mm in greatest dimension. Visualized lung apices are within normal limits. IMPRESSION: CT of the head: Mild atrophic changes without acute abnormality. CT of the cervical spine: Degenerative change without acute abnormality. Multinodular goiter. Dominant right-sided nodule measures 14 mm. Findings do not meet current SRU consensus criteria for biopsy. Follow-up by clinical exam is recommended. If patient has known risk factors for thyroid carcinoma, consider follow-up ultrasound in 12 months. If patient is clinically hyperthyroid, consider nuclear medicine thyroid uptake and  scan. Reference: Management of Thyroid Nodules Detected at [place]: Society of Radiologists in [place]. Radiology 2005; [id number]. Electronically Signed   By: Inez Catalina M.D.   On: 06/13/2015 14:21   Ct Cervical Spine Wo Contrast  06/13/2015   CLINICAL DATA:  Recent fall last evening with headaches and neck pain EXAM: CT HEAD WITHOUT CONTRAST CT CERVICAL SPINE WITHOUT CONTRAST TECHNIQUE: Multidetector CT imaging of the head and cervical spine was performed following the standard protocol without intravenous contrast. Multiplanar CT image reconstructions of the cervical spine were also generated. COMPARISON:  04/24/2015 FINDINGS: CT HEAD FINDINGS The bony calvarium is intact. No gross soft tissue abnormality is noted. Mild atrophic changes are seen. No findings to suggest acute hemorrhage, acute infarction or space-occupying mass lesion are noted. CT CERVICAL SPINE FINDINGS Seven cervical segments are well visualized. Vertebral body height is well maintained osteophytic changes are noted from C4-C7 with mild degrees of neural foraminal narrowing. Facet hypertrophic changes are seen without evidence of acute fracture. There is a small defect in the right lateral aspect of the C1 ring best seen on image number 21. This is chronic in nature and is been shown on multiple previous exams dating back to 2013. No gross soft tissue abnormality is noted aside from a multinodular goiter. The dominant nodule measures 14 mm in greatest dimension. Visualized lung apices are within normal limits. IMPRESSION: CT of the head: Mild atrophic changes without acute abnormality. CT of the cervical spine: Degenerative change without acute abnormality. Multinodular goiter. Dominant right-sided nodule measures 14 mm. Findings do not meet current SRU consensus criteria for biopsy. Follow-up by clinical exam is recommended. If patient has known risk factors for thyroid carcinoma, consider follow-up ultrasound in 12 months. If patient is clinically hyperthyroid, consider nuclear medicine thyroid uptake and scan. Reference: Management of Thyroid Nodules Detected at [place]: Society of Radiologists in [place]. Radiology 2005; [id number]. Electronically  Signed   By: Inez Catalina M.D.   On: 06/13/2015 14:21           Assessment/Plan:      69 y.o. female with  Principal Problem:   1.      Weakness- due to Progressive Decline and Uremia, and malnutrition   Comfort Care Measures     Active Problems:   2.     Encephalopathy- due to Metabolic Causes     3.     Malnutrition of moderate degree (HCC)/ Failure to Thrive due to Uremia   Nutritional Supplements TID   Clear Liquid Diet     4.     Fall- due to Weakness   Fall Precautions      5.     Essential hypertension   Continue Carvedilol Rx     6.     Chronic kidney disease (CKD), stage IV (severe) (HCC)   Opted to not have dialysis Rxs   Continue Bicarb TID     7.     Chronic systolic congestive heart failure (HCC)   compensated at this time     8.     COLD (chronic obstructive lung disease) (HCC)   Duonebs PRN   O2 PRN     9.     Type 2 diabetes mellitus with complication (HCC)   SSI coverage PRN    10.    DVT Prophylaxis   On Coumadin Rx    11.    Other- Palliative Care Consultation and Social Work Consultation    Arrange  for admission to [facility]    Code Status:     DO NOT RESUSCITATE (DNR)      Family Communication:   Family at Bedside   Disposition Plan:   Observation Status        Time spent: 55 Minutes      Theressa Millard [facility] Pager [phone]   If [place] Please Contact the Day Rounding Team MD for [facility]  If 7PM-7AM, Please Contact Night-Floor Coverage  [email or web address] Password [email or web address] 06/13/2015, 9:13 PM     ADDENDUM:   Patient was seen and examined on 06/13/2015

## 2015-06-13 NOTE — ED Notes (Signed)
Pt with vomiting green bile. Provider notified.

## 2015-06-13 NOTE — ED Notes (Signed)
IV Team at the bedside. 

## 2015-06-13 NOTE — ED Notes (Addendum)
Per EMS, Patient is coming from [facility] [place] care. Patient was found this morning after falling last night at 2100. Family found her this morning Alert and Oriented per baseline. Patient is a hospice patient for renal failure. Reported patient had one episode of vomiting with family after they got her up from the floor. Hx of HTN. Pt was given 4 mg of Zofran during transport. Vitals per EMS: Initial 180/110, Last 130/100, 80-110 Chronic Afib, 220 CBG.

## 2015-06-13 NOTE — ED Provider Notes (Signed)
CSN: [id number]     Arrival date & time 06/13/15  1153 History   First MD Initiated Contact with Patient 06/13/15 1221     Chief Complaint  Patient presents with  . Fall     (Consider location/radiation/quality/duration/timing/severity/associated sxs/prior Treatment) HPI [name] is a 69 yo female who presents to the Emergency Department for evaluation of a fall. The pt lives at [facility] [place] [facility]. She is accompanied by her sister and brother today. Sister reports that she found pt on the ground, lying on her back, vomiting and "gurgling" this morning. Sister suspects that pt was in the bathroom with her walker last night and fell on her way to answer the phone without her walker. Sister states walker was in the bathroom and water was still running this morning. Pt complains of nausea and headache. Pt is an overall poor historian. There were no witnesses to the fall. Pt denies chest pain. Endorses shortness of breath and dizziness.   Past Medical History  Diagnosis Date  . Essential hypertension, benign   . Coronary atherosclerosis of native coronary artery 01/04/2006    Tiny OM1 70-90% ostial stenosis, no other CAD  . COPD (chronic obstructive pulmonary disease) ([place])   . Esophageal reflux   . Dyslipidemia   . Gout   . Colon polyps 2011    Tubular adenomatous polyps  . Spinal stenosis of lumbar region     chronic low back pain.   . Bulging lumbar disc   . Chronic diastolic heart failure ([place])   . PNA (pneumonia) 2012    With pleural effusion, requiring thoracentesis  . Chronic bronchitis ([place])   . On home oxygen therapy   . OSA (obstructive sleep apnea)     Marginally compliant with CPAP  . Type II diabetes mellitus ([place])   . Arthritis   . Diverticulosis   . GERD (gastroesophageal reflux disease)   . Anxiety   . Hyperlipidemia   . Sarcoidosis of lung ([place])   . CKD (chronic kidney disease) stage 4, GFR 15-29 ml/min (HCC)    . IBS (irritable bowel syndrome)   . Cataracts, both eyes   . Headache(784.0)   . Lactic acidosis 01/21/2015  . Chronic CHF (congestive heart failure) [facility])    Past Surgical History  Procedure Laterality Date  . Vesicovaginal fistula closure w/  total abdominal hysterectomy  1992  . Bilateral total knee replacements Bilateral Rt=5/04 & Lft=1/09    by DrAlusio  . 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 Zella Richer  . Appendectomy  1957  . Tonsillectomy  1968  . Cholecystectomy  1980's  . Abdominal hysterectomy  1992  . Reduction mammaplasty Bilateral 1980's  . Cardiac catheterization  1990's  . Thoracentesis  2012  . Joint replacement    . Av fistula placement Left 12/31/2013    Procedure: ARTERIOVENOUS (AV) FISTULA CREATION;  Surgeon: Elam Dutch, MD;  Location: [place];  Service: Vascular;  Laterality: Left;  . Colonoscopy w/ biopsies and polypectomy    . Av fistula placement Left 03/18/2014    Procedure: ARTERIOVENOUS (AV) FISTULA CREATION- LEFT BRACHIOCEPHALIC ;  Surgeon: Elam Dutch, MD;  Location: [facility] OR;  Service: Vascular;  Laterality: Left;  . Ligation of arteriovenous  fistula Left 03/18/2014    Procedure: LIGATION OF ARTERIOVENOUS  FISTULA- LEFT RADIOCEPHALIC;  Surgeon: Elam Dutch, MD;  Location: [place];  Service: Vascular;  Laterality: Left;  . Cardiac catheterization N/A  01/27/2015    Procedure: Right Heart Cath;  Surgeon: Larey Dresser, MD;  Location: [place] [facility];  Service: Cardiovascular;  Laterality: N/A;  . Esophagogastroduodenoscopy (egd) with propofol N/A 03/14/2015    Procedure: ESOPHAGOGASTRODUODENOSCOPY (EGD) WITH PROPOFOL;  Surgeon: Gatha Mayer, MD;  Location: [place];  Service: Endoscopy;  Laterality: N/A;  . Colonoscopy with propofol N/A 03/14/2015    Procedure: COLONOSCOPY WITH PROPOFOL;  Surgeon: Gatha Mayer, MD;  Location: [place];  Service: Endoscopy;  Laterality: N/A;   Family History  Problem Relation Age of Onset  . Diabetes  Mother   . Heart disease Mother   . Hyperlipidemia Mother   . Varicose Veins Mother   . Breast cancer Maternal Aunt   . Heart disease Father   . Deep vein thrombosis Father   . Hyperlipidemia Father   . Heart disease Sister     before age 72  . Cancer Sister   . Diabetes Sister   . Hyperlipidemia Sister   . Heart attack Sister   . Heart disease Brother   . Diabetes Brother   . Hyperlipidemia Brother   . Heart attack Brother   . Hyperlipidemia Sister    Social History  Substance Use Topics  . Smoking status: Never Smoker   . Smokeless tobacco: Never Used  . Alcohol Use: No   OB History    No data available     Review of Systems  All other systems negative except as documented in the HPI. All pertinent positives and negatives as reviewed in the HPI.  Allergies  Adhesive; Avelox; Codeine; Guaifenesin; Latex; and Oxycodone  Home Medications   Prior to Admission medications   Medication Sig Start Date End Date Taking? Authorizing Provider  acetaminophen (TYLENOL) 500 MG tablet Take 1,000 mg by mouth 2 (two) times daily as needed (pain).    Yes Historical Provider, MD  albuterol (PROVENTIL HFA;VENTOLIN HFA) 108 (90 BASE) MCG/ACT inhaler Inhale 2 puffs into the lungs every 4 (four) hours as needed for wheezing or shortness of breath.    Yes Historical Provider, MD  albuterol (PROVENTIL) (2.5 MG/3ML) 0.083% nebulizer solution Take 2.5 mg by nebulization every 2 (two) hours as needed for wheezing or shortness of breath.   Yes Historical Provider, MD  atorvastatin (LIPITOR) 20 MG tablet Take 20 mg by mouth at bedtime.   Yes Historical Provider, MD  carvedilol (COREG) 3.125 MG tablet Take 1 tablet (3.125 mg total) by mouth 2 (two) times daily with a meal. 04/06/15  Yes Robbie Lis, MD  esomeprazole (NEXIUM) 40 MG capsule Take 1 capsule by mouth 30 min before breakfast. Patient taking differently: Take 40 mg by mouth daily.  03/03/15  Yes Amy S Esterwood, PA-C  famotidine (PEPCID)  20 MG tablet Take 1 tablet (20 mg total) by mouth daily. 02/02/15  Yes Ripudeep Krystal Eaton, MD  feeding supplement (BOOST / RESOURCE BREEZE) LIQD Take 1 Container by mouth 3 (three) times daily between meals. 03/06/15  Yes Shanker Kristeen Mans, MD  Fluticasone-Salmeterol (ADVAIR) 250-50 MCG/DOSE AEPB Inhale 1 puff into the lungs 2 (two) times daily. 09/25/14  Yes Noralee Space, MD  hydrocortisone (ANUSOL-HC) 2.5 % rectal cream Place 1 application rectally 4 (four) times daily. 02/28/15  Yes Amy S Esterwood, PA-C  HYDROmorphone (DILAUDID) 2 MG tablet Take 2-4 mg by mouth every 4 (four) hours as needed for moderate pain or severe pain.   Yes Historical Provider, MD  insulin lispro (HUMALOG KWIKPEN) 100 UNIT/ML  KiwkPen Inject 0.01-0.1 mLs (1-10 Units total) into the skin 3 (three) times daily. Sliding scale CBG 70 - 120: 0 units CBG 121 - 150: 1 unit,  CBG 151 - 200: 2 units,  CBG 201 - 250: 3 units,  CBG 251 - 300: 5 units,  CBG 301 - 350: 7 units,  CBG 351 - 400: 9 units   CBG > 400: 10 units and notify your MD 02/02/15  Yes Ripudeep K Rai, MD  ondansetron (ZOFRAN) 4 MG tablet Take 1 tablet (4 mg total) by mouth every 8 (eight) hours as needed for nausea or vomiting. 04/06/15  Yes Robbie Lis, MD  Pancrelipase, Lip-Prot-Amyl, (CREON) 24000 UNITS CPEP Take 24,000 Units by mouth daily.   Yes Historical Provider, MD  promethazine (PHENERGAN) 25 MG tablet Take 1 tablet (25 mg total) by mouth every 4 (four) hours as needed for nausea or vomiting. 02/02/15  Yes Ripudeep Krystal Eaton, MD  sodium bicarbonate 650 MG tablet Take 1 tablet (650 mg total) by mouth 2 (two) times daily. 02/02/15  Yes Ripudeep Krystal Eaton, MD  warfarin (COUMADIN) 1 MG tablet Take 2 mg by mouth daily.   Yes Historical Provider, MD  ferrous sulfate 325 (65 FE) MG tablet Take 325 mg by mouth daily with breakfast.    Historical Provider, MD  HYDROcodone-acetaminophen (NORCO/VICODIN) 5-325 MG per tablet Take 1 tablet by mouth every 4 (four) hours as needed for moderate pain.  04/28/15   Maryann Mikhail, DO  warfarin (COUMADIN) 1 MG tablet Take 1.5 tablets (1.5 mg total) by mouth every evening. 04/06/15   Robbie Lis, MD   BP 194/94 mmHg  Pulse 55  Temp(Src) 97.5 F (36.4 C) (Oral)  Resp 20  SpO2 98% Physical Exam  Constitutional: She is oriented to person, place, and time. She appears well-developed and well-nourished. No distress.  HENT:  Head: Normocephalic and atraumatic.  Mouth/Throat: Oropharynx is clear and moist.  Eyes: Conjunctivae are normal. Pupils are equal, round, and reactive to light.  Neck: Normal range of motion. Neck supple.  Pt had a neck brace.  Cardiovascular: Normal rate, regular rhythm and normal heart sounds.  Exam reveals no gallop and no friction rub.   No murmur heard. Pulmonary/Chest: Effort normal and breath sounds normal. No respiratory distress. She has no wheezes. She has no rales.  Abdominal: Soft. Bowel sounds are normal. She exhibits no distension. There is no tenderness. There is no rebound.  Musculoskeletal: She exhibits no edema.  Decreased strength on bilateral UE.  Neurological: She is alert and oriented to person, place, and time. She exhibits normal muscle tone. Coordination normal.  Skin: Skin is warm and dry. No rash noted.  Psychiatric: Her mood appears anxious. She exhibits a depressed mood.  Nursing note and vitals reviewed.   ED Course  Procedures (including critical care time) Labs Review Labs Reviewed  CK  URINALYSIS, ROUTINE W REFLEX MICROSCOPIC (NOT AT [facility])  CBC WITH DIFFERENTIAL/PLATELET  BASIC METABOLIC PANEL    Imaging Review Dg Chest 2 View  06/13/2015  CLINICAL DATA:  Patient was found this morning after falling last night at 2100. Family found her this morning Alert and Oriented per baseline. Patient is a hospice patient for renal failure. Reported patient had one episode of vomiting. EXAM: CHEST  2 VIEW COMPARISON:  04/24/2015 FINDINGS: The heart is enlarged. Shallow lung inflation. There  are no focal consolidations or pleural effusions. No pulmonary edema. There are small bilateral pleural effusions. Surgical clips are seen  in the upper abdomen. IMPRESSION: 1. Cardiomegaly without pulmonary edema. 2. Small bilateral effusions. Electronically Signed   By: Nolon Nations M.D.   On: 06/13/2015 14:26   Dg Pelvis 1-2 Views  06/13/2015  CLINICAL DATA:  [place] last night.  Pelvic pain. EXAM: PELVIS - 1-2 VIEW COMPARISON:  CT scan 03/09/2015. FINDINGS: Both hips are normally located. No acute hip fracture or plain film evidence of avascular necrosis. The pubic symphysis and SI joints are intact. No pelvic fractures or bone lesions. Extensive vascular calcifications are noted. IMPRESSION: No acute bony findings. Electronically Signed   By: Marijo Sanes M.D.   On: 06/13/2015 14:26   Ct Head Wo Contrast  06/13/2015  CLINICAL DATA:  Recent fall last evening with headaches and neck pain EXAM: CT HEAD WITHOUT CONTRAST CT CERVICAL SPINE WITHOUT CONTRAST TECHNIQUE: Multidetector CT imaging of the head and cervical spine was performed following the standard protocol without intravenous contrast. Multiplanar CT image reconstructions of the cervical spine were also generated. COMPARISON:  04/24/2015 FINDINGS: CT HEAD FINDINGS The bony calvarium is intact. No gross soft tissue abnormality is noted. Mild atrophic changes are seen. No findings to suggest acute hemorrhage, acute infarction or space-occupying mass lesion are noted. CT CERVICAL SPINE FINDINGS Seven cervical segments are well visualized. Vertebral body height is well maintained osteophytic changes are noted from C4-C7 with mild degrees of neural foraminal narrowing. Facet hypertrophic changes are seen without evidence of acute fracture. There is a small defect in the right lateral aspect of the C1 ring best seen on image number 21. This is chronic in nature and is been shown on multiple previous exams dating back to 2013. No gross soft tissue  abnormality is noted aside from a multinodular goiter. The dominant nodule measures 14 mm in greatest dimension. Visualized lung apices are within normal limits. IMPRESSION: CT of the head: Mild atrophic changes without acute abnormality. CT of the cervical spine: Degenerative change without acute abnormality. Multinodular goiter. Dominant right-sided nodule measures 14 mm. Findings do not meet current SRU consensus criteria for biopsy. Follow-up by clinical exam is recommended. If patient has known risk factors for thyroid carcinoma, consider follow-up ultrasound in 12 months. If patient is clinically hyperthyroid, consider nuclear medicine thyroid uptake and scan. Reference: Management of Thyroid Nodules Detected at [place]: Society of Radiologists in [place]. Radiology 2005; [id number]. Electronically Signed   By: Inez Catalina M.D.   On: 06/13/2015 14:21   Ct Cervical Spine Wo Contrast  06/13/2015  CLINICAL DATA:  Recent fall last evening with headaches and neck pain EXAM: CT HEAD WITHOUT CONTRAST CT CERVICAL SPINE WITHOUT CONTRAST TECHNIQUE: Multidetector CT imaging of the head and cervical spine was performed following the standard protocol without intravenous contrast. Multiplanar CT image reconstructions of the cervical spine were also generated. COMPARISON:  04/24/2015 FINDINGS: CT HEAD FINDINGS The bony calvarium is intact. No gross soft tissue abnormality is noted. Mild atrophic changes are seen. No findings to suggest acute hemorrhage, acute infarction or space-occupying mass lesion are noted. CT CERVICAL SPINE FINDINGS Seven cervical segments are well visualized. Vertebral body height is well maintained osteophytic changes are noted from C4-C7 with mild degrees of neural foraminal narrowing. Facet hypertrophic changes are seen without evidence of acute fracture. There is a small defect in the right lateral aspect of the C1 ring best seen on image number 21. This is  chronic in nature and is been shown on multiple previous exams dating back to 2013. No gross soft  tissue abnormality is noted aside from a multinodular goiter. The dominant nodule measures 14 mm in greatest dimension. Visualized lung apices are within normal limits. IMPRESSION: CT of the head: Mild atrophic changes without acute abnormality. CT of the cervical spine: Degenerative change without acute abnormality. Multinodular goiter. Dominant right-sided nodule measures 14 mm. Findings do not meet current SRU consensus criteria for biopsy. Follow-up by clinical exam is recommended. If patient has known risk factors for thyroid carcinoma, consider follow-up ultrasound in 12 months. If patient is clinically hyperthyroid, consider nuclear medicine thyroid uptake and scan. Reference: Management of Thyroid Nodules Detected at [place]: Society of Radiologists in [place]. Radiology 2005; [id number]. Electronically Signed   By: Inez Catalina M.D.   On: 06/13/2015 14:21   I have personally reviewed and evaluated these images and lab results as part of my medical decision-making.   EKG Interpretation None      Patient will be signed out awaiting further testing and all of her results.  The family is explained the plan and all questions were answered.  Patient is under hospice care at this time   Dalia Heading, PA-C 06/17/15 [place], MD 06/19/15 [phone]

## 2015-06-13 NOTE — ED Notes (Signed)
Received a call from IV Team. Stated patient was on the bedpan and Family insisted she come back. I asked RN to stay and perform IV. She stated she had already left and she would send second shift to complete IV.

## 2015-06-13 NOTE — ED Notes (Signed)
Family at the bedside. Waiting for admission

## 2015-06-13 NOTE — Progress Notes (Signed)
[facility] patient seen in the ED after a fall last night. Pt's family found her on the floor this morning. Pt. lives alone. Staff RN unsure if pt. will be admitted to the hospital. Pt. answers questions but speech is unclear. She denies any pain. Pt. Currently wearing cervical collar. [facility] will continue to follow and will see her tomorrow if she is admitted or at home if she is discharged. Please call with any questions. Medication list placed with DNR form.  [facility] Liaison [phone]

## 2015-06-13 NOTE — ED Notes (Signed)
Food ordered at [place] never got to the ED, supervisor notified and further follow up made. ED CN and ED supervisor notified.

## 2015-06-13 NOTE — ED Notes (Signed)
Called Phlebotomy at the bedside.

## 2015-06-13 NOTE — ED Notes (Signed)
Pt unable to pass the swallow screen, this is not stroke related, pt is a DNR pt on hospice care, Dr.  Alvino Chapel consulted about feeding and given water to the pt and order gotten that is ok to feed this pt at this point if she wants to eat. Family member very frustrated about the wait for feed this pt and give her water, family oriented about pt safety and the reason to don't do it right away until MD is consulted about it, pt's family member still upset and refuses to listen to education. Water given to the pt through a spoon since pt wasn't able to get the water other way. Pt is getting progressively weak.

## 2015-06-13 NOTE — ED Notes (Signed)
Pt placed on bedpan by Ria Comment, RN. Waiting for IV Team

## 2015-06-13 NOTE — ED Notes (Signed)
PA Student at the bedside.  

## 2015-06-13 NOTE — ED Notes (Signed)
Report attempted, RN to call back in 5 min.

## 2015-06-13 NOTE — ED Notes (Signed)
PA Lawyer made aware of patient's condition.

## 2015-06-13 NOTE — ED Provider Notes (Signed)
  Face-to-face evaluation   History: Patient found on floor by family members, presents for evaluation of injuries. Her hospice nurse came to her facility and recommended that the patient be transferred to [facility]. Family members wanted her transferred here for evaluation. She was discharged from rehabilitation about 3 weeks ago to a independent living facility associated with the hospice care system. Per family members she has had decreased oral intake for about 10 days, and is intermittently taking her medications, about 80% of the time. She has chronic ongoing confusion and decreased interactions, verbally and worsening ability to ambulate with her walker.  Earlier today, the family members talk to the hospice nurse, and indicated that they wanted the patient admitted to the hospital, so she could be transferred to rehabilitation, hopefully to improve her situation, and be moved to a different living setting.  Physical exam: Alert, elderly female, who appears malnourished. She is sighing continuously. She is not in respiratory distress.   16:50- I discussed case with the on-call hospice nurse, who is currently at [facility]. She states that at this time, a transfer to [facility] is probably not possible. She thinks that placement there could be done tomorrow. She asked if the family members felt like they could manage the patient at home tonight. The hospice service is available to consult 24 hours a day, at phone [phone].  16:55-  I discussed all the above findings with the patient's sister. She and her son feel that the patient cannot be managed currently in her state, at her home, by the family.  I have requested a palliative care consult. It is unlikely that this will be performed this evening.  Medical screening examination/treatment/procedure(s) were conducted as a shared visit with non-physician practitioner(s) and myself.  I personally evaluated the patient during the  encounter  Daleen Bo, MD 06/19/15 2332

## 2015-06-13 NOTE — ED Notes (Signed)
Recalled for meal tray @ 2024

## 2015-06-13 NOTE — ED Notes (Signed)
Phlebotomy at the bedside  

## 2015-06-13 NOTE — ED Notes (Signed)
MD at the bedside  

## 2015-06-13 NOTE — ED Notes (Signed)
Attempted IV x1 with [place]

## 2015-06-13 NOTE — ED Notes (Signed)
PA at the bedside.

## 2015-06-13 NOTE — ED Notes (Signed)
Meal Tray (Reg. Diet) -soft food 1924

## 2015-06-13 NOTE — ED Provider Notes (Signed)
Patient care handed of to me by Irena Cords, PA-C at shift change. Pending urinalysis results and decision on placement.  In short, Ms. Boise had an unwitnessed fall yesterday. Her family is at bedside and provides much the story. Patient is alert but seems confused. Sr. believes that she was on the floor overnight. She was recently released from rehabilitation and transitioned back to an independent living facility. For full history of present illness, see note by Dalia Heading, PA-C.   Patient's family acknowledges that patient can no longer live on her own. They state that they are not able to take care of her and wished her to be admitted. Urinalysis negative for UTI. Discussed with family that she has no admittable diagnosis at this time. She is in the process of being excepted at [facility]. I have been in discussions with the hospice nurse at [facility] and she reports that they do not have a bed available tonight. Hospice nurse, Marzetta Board, will come to [facility] to admit patient to hospitalist. Consult to triad hospitalists, Dr. Arnoldo Morale, to admit patient for observation overnight until hospice can take her into their care. Dr. Arnoldo Morale agrees with this plan. Discussed with family and patient at bedside who agree with this plan.  Filed Vitals:   06/13/15 1945 06/13/15 2131 06/13/15 2208 06/14/15 0105  BP: 227/106 209/106    Pulse: 64 91    Temp:  97.7 F (36.5 C)    TempSrc:  Oral    Resp: 31 21    Weight:  146 lb 9.6 oz (66.497 kg)    SpO2: 100% 100% 97% 96%   Results for orders placed or performed during the hospital encounter of 06/13/15 (from the past 48 hour(s))  CK     Status: None   Collection Time: 06/13/15  4:30 PM  Result Value Ref Range   Total CK 61 38 - 234 U/L  CBC with Differential     Status: Abnormal   Collection Time: 06/13/15  4:30 PM  Result Value Ref Range   WBC 14.2 (H) 4.0 - 10.5 K/uL   RBC 5.96 (H) 3.87 - 5.11 MIL/uL   Hemoglobin 18.4 (H) 12.0  - 15.0 g/dL   HCT 53.5 (H) 36.0 - 46.0 %   MCV 89.8 78.0 - 100.0 fL   MCH 30.9 26.0 - 34.0 pg   MCHC 34.4 30.0 - 36.0 g/dL   RDW 12.5 11.5 - 15.5 %   Platelets 398 150 - 400 K/uL   Neutrophils Relative % 80 %   Neutro Abs 11.3 (H) 1.7 - 7.7 K/uL   Lymphocytes Relative 13 %   Lymphs Abs 1.9 0.7 - 4.0 K/uL   Monocytes Relative 7 %   Monocytes Absolute 1.0 0.1 - 1.0 K/uL   Eosinophils Relative 0 %   Eosinophils Absolute 0.0 0.0 - 0.7 K/uL   Basophils Relative 0 %   Basophils Absolute 0.0 0.0 - 0.1 K/uL  Basic metabolic panel     Status: Abnormal   Collection Time: 06/13/15  4:30 PM  Result Value Ref Range   Sodium 140 135 - 145 mmol/L   Potassium 3.9 3.5 - 5.1 mmol/L   Chloride 102 101 - 111 mmol/L   CO2 23 22 - 32 mmol/L   Glucose, Bld 221 (H) 65 - 99 mg/dL   BUN 28 (H) 6 - 20 mg/dL   Creatinine, Ser 3.06 (H) 0.44 - 1.00 mg/dL   Calcium 12.2 (H) 8.9 - 10.3 mg/dL  GFR calc non Af Amer 15 (L) >60 mL/min   GFR calc Af Amer 17 (L) >60 mL/min    Comment: (NOTE) The eGFR has been calculated using the CKD EPI equation. This calculation has not been validated in all clinical situations. eGFR's persistently <60 mL/min signify possible Chronic Kidney Disease.    Anion gap 15 5 - 15  Urinalysis, Routine w reflex microscopic     Status: Abnormal   Collection Time: 06/13/15  4:51 PM  Result Value Ref Range   Color, Urine YELLOW YELLOW   APPearance CLEAR CLEAR   Specific Gravity, Urine 1.019 1.005 - 1.030   pH 7.0 5.0 - 8.0   Glucose, UA 500 (A) NEGATIVE mg/dL   Hgb urine dipstick MODERATE (A) NEGATIVE   Bilirubin Urine NEGATIVE NEGATIVE   Ketones, ur 15 (A) NEGATIVE mg/dL   Protein, ur >300 (A) NEGATIVE mg/dL   Urobilinogen, UA 0.2 0.0 - 1.0 mg/dL   Nitrite NEGATIVE NEGATIVE   Leukocytes, UA NEGATIVE NEGATIVE  Urine microscopic-add on     Status: Abnormal   Collection Time: 06/13/15  4:51 PM  Result Value Ref Range   Squamous Epithelial / LPF RARE RARE   WBC, UA 3-6 <3  WBC/hpf   RBC / HPF 0-2 <3 RBC/hpf   Bacteria, UA RARE RARE   Crystals CA OXALATE CRYSTALS (A) NEGATIVE  Glucose, capillary     Status: Abnormal   Collection Time: 06/13/15 11:00 PM  Result Value Ref Range   Glucose-Capillary 259 (H) 65 - 99 mg/dL   Comment 1 Notify RN   Protime-INR     Status: Abnormal   Collection Time: 06/13/15 11:26 PM  Result Value Ref Range   Prothrombin Time 36.7 (H) 11.6 - 15.2 seconds   INR 3.82 (H) 0.00 - 1.49     Wandra Babin, PA-C 06/14/15 0131  Davonna Belling, MD 06/16/15 1153

## 2015-06-13 NOTE — ED Notes (Signed)
Pt returned from X-ray.  

## 2015-06-14 ENCOUNTER — Observation Stay (HOSPITAL_COMMUNITY)

## 2015-06-14 DIAGNOSIS — R531 Weakness: Secondary | ICD-10-CM | POA: Diagnosis not present

## 2015-06-14 DIAGNOSIS — K589 Irritable bowel syndrome without diarrhea: Secondary | ICD-10-CM | POA: Diagnosis present

## 2015-06-14 DIAGNOSIS — I5022 Chronic systolic (congestive) heart failure: Secondary | ICD-10-CM

## 2015-06-14 DIAGNOSIS — Z515 Encounter for palliative care: Secondary | ICD-10-CM | POA: Diagnosis present

## 2015-06-14 DIAGNOSIS — G9340 Encephalopathy, unspecified: Secondary | ICD-10-CM

## 2015-06-14 DIAGNOSIS — I132 Hypertensive heart and chronic kidney disease with heart failure and with stage 5 chronic kidney disease, or end stage renal disease: Secondary | ICD-10-CM | POA: Diagnosis present

## 2015-06-14 DIAGNOSIS — Z66 Do not resuscitate: Secondary | ICD-10-CM | POA: Diagnosis present

## 2015-06-14 DIAGNOSIS — F0390 Unspecified dementia without behavioral disturbance: Secondary | ICD-10-CM | POA: Diagnosis present

## 2015-06-14 DIAGNOSIS — N184 Chronic kidney disease, stage 4 (severe): Secondary | ICD-10-CM

## 2015-06-14 DIAGNOSIS — G9341 Metabolic encephalopathy: Secondary | ICD-10-CM | POA: Diagnosis present

## 2015-06-14 DIAGNOSIS — Z9981 Dependence on supplemental oxygen: Secondary | ICD-10-CM | POA: Diagnosis not present

## 2015-06-14 DIAGNOSIS — I2510 Atherosclerotic heart disease of native coronary artery without angina pectoris: Secondary | ICD-10-CM | POA: Diagnosis present

## 2015-06-14 DIAGNOSIS — K219 Gastro-esophageal reflux disease without esophagitis: Secondary | ICD-10-CM | POA: Diagnosis present

## 2015-06-14 DIAGNOSIS — R1084 Generalized abdominal pain: Secondary | ICD-10-CM | POA: Diagnosis not present

## 2015-06-14 DIAGNOSIS — E860 Dehydration: Secondary | ICD-10-CM | POA: Diagnosis present

## 2015-06-14 DIAGNOSIS — E785 Hyperlipidemia, unspecified: Secondary | ICD-10-CM | POA: Diagnosis present

## 2015-06-14 DIAGNOSIS — G8929 Other chronic pain: Secondary | ICD-10-CM | POA: Diagnosis present

## 2015-06-14 DIAGNOSIS — E861 Hypovolemia: Secondary | ICD-10-CM | POA: Diagnosis present

## 2015-06-14 DIAGNOSIS — E440 Moderate protein-calorie malnutrition: Secondary | ICD-10-CM | POA: Diagnosis present

## 2015-06-14 DIAGNOSIS — E876 Hypokalemia: Secondary | ICD-10-CM | POA: Diagnosis present

## 2015-06-14 DIAGNOSIS — M1990 Unspecified osteoarthritis, unspecified site: Secondary | ICD-10-CM | POA: Diagnosis present

## 2015-06-14 DIAGNOSIS — Z794 Long term (current) use of insulin: Secondary | ICD-10-CM | POA: Diagnosis not present

## 2015-06-14 DIAGNOSIS — R109 Unspecified abdominal pain: Secondary | ICD-10-CM | POA: Diagnosis present

## 2015-06-14 DIAGNOSIS — E1122 Type 2 diabetes mellitus with diabetic chronic kidney disease: Secondary | ICD-10-CM | POA: Diagnosis present

## 2015-06-14 DIAGNOSIS — N186 End stage renal disease: Secondary | ICD-10-CM | POA: Diagnosis present

## 2015-06-14 DIAGNOSIS — I272 Other secondary pulmonary hypertension: Secondary | ICD-10-CM | POA: Diagnosis present

## 2015-06-14 DIAGNOSIS — G4733 Obstructive sleep apnea (adult) (pediatric): Secondary | ICD-10-CM | POA: Diagnosis present

## 2015-06-14 DIAGNOSIS — I5042 Chronic combined systolic (congestive) and diastolic (congestive) heart failure: Secondary | ICD-10-CM | POA: Diagnosis present

## 2015-06-14 DIAGNOSIS — R627 Adult failure to thrive: Secondary | ICD-10-CM | POA: Diagnosis present

## 2015-06-14 DIAGNOSIS — M109 Gout, unspecified: Secondary | ICD-10-CM | POA: Diagnosis present

## 2015-06-14 DIAGNOSIS — J449 Chronic obstructive pulmonary disease, unspecified: Secondary | ICD-10-CM | POA: Diagnosis present

## 2015-06-14 LAB — PROTIME-INR
INR: 4.08 — AB (ref 0.00–1.49)
Prothrombin Time: 38.6 seconds — ABNORMAL HIGH (ref 11.6–15.2)

## 2015-06-14 LAB — GLUCOSE, CAPILLARY
GLUCOSE-CAPILLARY: 220 mg/dL — AB (ref 65–99)
GLUCOSE-CAPILLARY: 208 mg/dL — AB (ref 65–99)
GLUCOSE-CAPILLARY: 104 mg/dL — AB (ref 65–99)
Glucose-Capillary: 259 mg/dL — ABNORMAL HIGH (ref 65–99)
Glucose-Capillary: 202 mg/dL — ABNORMAL HIGH (ref 65–99)
Glucose-Capillary: 105 mg/dL — ABNORMAL HIGH (ref 65–99)

## 2015-06-14 LAB — BASIC METABOLIC PANEL
ANION GAP: 13 (ref 5–15)
BUN: 27 mg/dL — ABNORMAL HIGH (ref 6–20)
CHLORIDE: 101 mmol/L (ref 101–111)
CO2: 26 mmol/L (ref 22–32)
Calcium: 11.5 mg/dL — ABNORMAL HIGH (ref 8.9–10.3)
Creatinine, Ser: 2.9 mg/dL — ABNORMAL HIGH (ref 0.44–1.00)
GFR calc Af Amer: 18 mL/min — ABNORMAL LOW (ref >60)
GFR calc non Af Amer: 16 mL/min — ABNORMAL LOW (ref >60)
GLUCOSE: 208 mg/dL — AB (ref 65–99)
POTASSIUM: 3.6 mmol/L (ref 3.5–5.1)
Sodium: 140 mmol/L (ref 135–145)

## 2015-06-14 LAB — CBC
HEMATOCRIT: 51.1 % — AB (ref 36.0–46.0)
HEMOGLOBIN: 17.3 g/dL — AB (ref 12.0–15.0)
MCH: 31.2 pg (ref 26.0–34.0)
MCHC: 33.9 g/dL (ref 30.0–36.0)
MCV: 92.1 fL (ref 78.0–100.0)
Platelets: 426 10*3/uL — ABNORMAL HIGH (ref 150–400)
RBC: 5.55 MIL/uL — AB (ref 3.87–5.11)
RDW: 13 % (ref 11.5–15.5)
WBC: 15.7 10*3/uL — ABNORMAL HIGH (ref 4.0–10.5)

## 2015-06-14 LAB — URINE CULTURE
CULTURE: NO GROWTH
Special Requests: NORMAL

## 2015-06-14 MED ORDER — HYDROMORPHONE HCL 2 MG PO TABS: 2.0000 mg | ORAL_TABLET | ORAL | Status: DC | PRN

## 2015-06-14 MED ORDER — ACETAMINOPHEN 500 MG PO TABS: 1000.0000 mg | ORAL_TABLET | Freq: Two times a day (BID) | ORAL | Status: DC | PRN

## 2015-06-14 MED ORDER — CARVEDILOL 6.25 MG PO TABS: 6.2500 mg | ORAL_TABLET | Freq: Two times a day (BID) | ORAL | Status: DC

## 2015-06-14 MED ORDER — HYDRALAZINE HCL 20 MG/ML IJ SOLN
10.0000 mg | Freq: Four times a day (QID) | INTRAMUSCULAR | Status: DC | PRN
  Administered 2015-06-14: 10 mg via INTRAVENOUS
  Filled 2015-06-14: qty 1

## 2015-06-14 MED ORDER — WHITE PETROLATUM GEL
Status: AC
  Administered 2015-06-14
  Filled 2015-06-14: qty 1

## 2015-06-14 MED ORDER — MOMETASONE FURO-FORMOTEROL FUM 100-5 MCG/ACT IN AERO
2.0000 | INHALATION_SPRAY | Freq: Two times a day (BID) | RESPIRATORY_TRACT | Status: DC
  Filled 2015-06-14 (×2): qty 8.8

## 2015-06-14 MED ORDER — CARVEDILOL 12.5 MG PO TABS
12.5000 mg | ORAL_TABLET | Freq: Two times a day (BID) | ORAL | Status: DC
  Administered 2015-06-14: 12.5 mg via ORAL
  Filled 2015-06-14: qty 1

## 2015-06-14 MED ORDER — WARFARIN - PHARMACIST DOSING INPATIENT: Freq: Every day | Status: DC

## 2015-06-14 MED ORDER — SODIUM CHLORIDE 0.9 % IV SOLN
INTRAVENOUS | Status: DC
  Administered 2015-06-14: 100 mL via INTRAVENOUS

## 2015-06-14 MED ORDER — HYDROMORPHONE HCL 1 MG/ML IJ SOLN
1.0000 mg | INTRAMUSCULAR | Status: DC | PRN
  Administered 2015-06-14: 1 mg via INTRAVENOUS
  Filled 2015-06-14: qty 1

## 2015-06-14 NOTE — Progress Notes (Signed)
[facility] Visit-Stacie Carlye Grippe, RN, BSN  This is a current [facility] patient with diagnosis of heart disease.  She has an out of facility DNR.  Patient seen in room with sister, Anne Ng at bedside.  Patient too weak at this time to participate in conversation.  She appears comfortable.  Family reported that the patient had an unwitnessed fall, where she was found in emesis the next day. She continues to have nausea and bilious emesis with last dose of 4mg  Zofran given at 0823. Patient lives at home alone.  Family voiced concern that she is no longer able to live alone.  The family does not have anyone at this time who can be with her more than a couple of hours a day.  They stated that even with prepared meals, the patient does not eat when she is home alone.  They verbalized she was recently at [facility] receiving rehab, and wish to seek nursing home placement if this may be an option. [facility] will continue to follow.  [facility] medication list and transfer summary placed on chart.  Thank You, [facility] Liaison [phone]

## 2015-06-14 NOTE — Progress Notes (Signed)
ANTICOAGULATION CONSULT NOTE - Initial Consult  Pharmacy Consult for warfarin Indication: apical thrombus  Allergies  Allergen Reactions  . Adhesive [Tape] Other (See Comments)    Blisters   . Avelox [Moxifloxacin Hcl In Nacl] Other (See Comments)    GI upset  . Codeine Other (See Comments)    "crazy"   . Guaifenesin Nausea And Vomiting    Takes Mucinex at home without issue  . Latex Swelling  . Oxycodone Nausea And Vomiting    Takes Percocet at home without issue    Patient Measurements: Weight: 146 lb 9.6 oz (66.497 kg) Heparin Dosing Weight:   Vital Signs: Temp: 98.4 F (36.9 C) (11/12 0907) Temp Source: Oral (11/12 0907) BP: 176/81 mmHg (11/12 0907) Pulse Rate: 84 (11/12 0907)  Labs:  Recent Labs  06/13/15 1630 06/13/15 2326 06/14/15 0549  HGB 18.4*  --  17.3*  HCT 53.5*  --  51.1*  PLT 398  --  426*  LABPROT  --  36.7* 38.6*  INR  --  3.82* 4.08*  CREATININE 3.06*  --  2.90*  CKTOTAL 61  --   --     Estimated Creatinine Clearance: 17.2 mL/min (by C-G formula based on Cr of 2.9).   Medical History: Past Medical History  Diagnosis Date  . Essential hypertension, benign   . Coronary atherosclerosis of native coronary artery 01/04/2006    Tiny OM1 70-90% ostial stenosis, no other CAD  . COPD (chronic obstructive pulmonary disease) ([place])   . Esophageal reflux   . Dyslipidemia   . Gout   . Colon polyps 2011    Tubular adenomatous polyps  . Spinal stenosis of lumbar region     chronic low back pain.   . Bulging lumbar disc   . Chronic diastolic heart failure ([place])   . PNA (pneumonia) 2012    With pleural effusion, requiring thoracentesis  . Chronic bronchitis ([place])   . On home oxygen therapy   . OSA (obstructive sleep apnea)     Marginally compliant with CPAP  . Type II diabetes mellitus ([place])   . Arthritis   . Diverticulosis   . GERD (gastroesophageal reflux disease)   . Anxiety   . Hyperlipidemia   . Sarcoidosis of lung ([place])   . CKD  (chronic kidney disease) stage 4, GFR 15-29 ml/min (HCC)   . IBS (irritable bowel syndrome)   . Cataracts, both eyes   . Headache(784.0)   . Lactic acidosis 01/21/2015  . Chronic CHF (congestive heart failure) (HCC)     Medications:  Prescriptions prior to admission  Medication Sig Dispense Refill Last Dose  . acetaminophen (TYLENOL) 500 MG tablet Take 1,000 mg by mouth 2 (two) times daily as needed (pain).    Past Month at Unknown time  . albuterol (PROVENTIL HFA;VENTOLIN HFA) 108 (90 BASE) MCG/ACT inhaler Inhale 2 puffs into the lungs every 4 (four) hours as needed for wheezing or shortness of breath.    Past Month at Unknown time  . albuterol (PROVENTIL) (2.5 MG/3ML) 0.083% nebulizer solution Take 2.5 mg by nebulization every 2 (two) hours as needed for wheezing or shortness of breath.   Past Month at Unknown time  . atorvastatin (LIPITOR) 20 MG tablet Take 20 mg by mouth at bedtime.   06/12/2015 at Unknown time  . carvedilol (COREG) 3.125 MG tablet Take 1 tablet (3.125 mg total) by mouth 2 (two) times daily with a meal. 60 tablet 0 06/12/2015 at 8a  . esomeprazole (NEXIUM) 40 MG  capsule Take 1 capsule by mouth 30 min before breakfast. (Patient taking differently: Take 40 mg by mouth daily. ) 30 capsule 6 Past Week at Unknown time  . famotidine (PEPCID) 20 MG tablet Take 1 tablet (20 mg total) by mouth daily. 30 tablet 3 06/12/2015 at Unknown time  . feeding supplement (BOOST / RESOURCE BREEZE) LIQD Take 1 Container by mouth 3 (three) times daily between meals. 90 Container 0 06/12/2015 at Unknown time  . Fluticasone-Salmeterol (ADVAIR) 250-50 MCG/DOSE AEPB Inhale 1 puff into the lungs 2 (two) times daily. 180 each 3 06/12/2015 at Unknown time  . hydrocortisone (ANUSOL-HC) 2.5 % rectal cream Place 1 application rectally 4 (four) times daily. 30 g 0 Past Week at Unknown time  . HYDROmorphone (DILAUDID) 2 MG tablet Take 2-4 mg by mouth every 4 (four) hours as needed for moderate pain or severe  pain.   Past Week at Unknown time  . insulin lispro (HUMALOG KWIKPEN) 100 UNIT/ML KiwkPen Inject 0.01-0.1 mLs (1-10 Units total) into the skin 3 (three) times daily. Sliding scale CBG 70 - 120: 0 units CBG 121 - 150: 1 unit,  CBG 151 - 200: 2 units,  CBG 201 - 250: 3 units,  CBG 251 - 300: 5 units,  CBG 301 - 350: 7 units,  CBG 351 - 400: 9 units   CBG > 400: 10 units and notify your MD 15 mL 3 Past Month at Unknown time  . ondansetron (ZOFRAN) 4 MG tablet Take 1 tablet (4 mg total) by mouth every 8 (eight) hours as needed for nausea or vomiting. 30 tablet 0 Past Month at Unknown time  . Pancrelipase, Lip-Prot-Amyl, (CREON) 24000 UNITS CPEP Take 24,000 Units by mouth daily.   06/12/2015 at Unknown time  . promethazine (PHENERGAN) 25 MG tablet Take 1 tablet (25 mg total) by mouth every 4 (four) hours as needed for nausea or vomiting. 30 tablet 0 06/13/2015 at Unknown time  . sodium bicarbonate 650 MG tablet Take 1 tablet (650 mg total) by mouth 2 (two) times daily. 60 tablet 3 06/12/2015 at Unknown time  . warfarin (COUMADIN) 1 MG tablet Take 2 mg by mouth daily.   06/12/2015 at 6p  . ferrous sulfate 325 (65 FE) MG tablet Take 325 mg by mouth daily with breakfast.   unknown  . HYDROcodone-acetaminophen (NORCO/VICODIN) 5-325 MG per tablet Take 1 tablet by mouth every 4 (four) hours as needed for moderate pain. 30 tablet 0   . warfarin (COUMADIN) 1 MG tablet Take 1.5 tablets (1.5 mg total) by mouth every evening. 30 tablet 0 unknown    Assessment: 69 yo female with multiple comorbidities, on warfarin for L ventricular apical thrombus. Current INR trending up 3.8 > 4, no s/sx bleeding.  Goal of Therapy:  INR 2-3 Monitor platelets by anticoagulation protocol: Yes    Plan:  -Hold warfarin -Daily INR    Hughes Better, PharmD, BCPS Clinical Pharmacist Pager: [phone] 06/14/2015 1:49 PM

## 2015-06-14 NOTE — Progress Notes (Signed)
[facility]                                                                              Patient Demographics  [name], is a 69 y.o. female, DOB - 21-Dec-1945, [id number]  Admit date - 06/13/2015   Admitting Physician Theressa Millard, MD  Outpatient Primary MD for the patient is FULP, CAMMIE, MD  LOS -    Chief Complaint  Patient presents with  . Fall       Brief HPI   Per Dr. Arnoldo Morale admit note on 11/11 [name] is a 69 y.o. female with Multiple Medical Problems including ESRD not on Dialysis Rx, and Sarcoidosis and DM2 who was brought to the ED by her family after a Fall a night before the admission in her home. Her family reported that she has had increased weakness and has had continued decline and poor intake of foods and liquids over the past week. She has also been less interactive and more confused. Options were discussed and Social work was consulted and [name] and her son had agreed to placement at the [facility] for [facility] She was referred for admission to facilitate placement at [place].   Assessment & Plan    Principal Problem:  Generalized weakness with progressive decline, failure to thrive - Palliative medicine consulted to assist with goals of care - UA with no UTI, family had requested [place] for hospice care, social work consult placed  Active Problems:   Essential hypertension/accelerated hypertension - Increase Coreg to 12.5 mg BID, placed on IV hydralazine as needed with parameters    Chronic kidney disease (CKD), stage IV (severe) (HCC)/ESRD not on dialysis -Creatinine currently at baseline, had presented with creatinine of 3.0, 2.9 this morning - Continue gentle hydration - CK 61      acute metabolic Encephalopathy Likely superimposed on dementia with failure to thrive, moderate malnutrition - Palliative medicine consulted    Chronic systolic  and diastolic congestive heart  failure (HCC) - Currently dehydrated and hypokalemia, on gentle hydration for 1 L then saline lock - 2-D echo 6/16 had shown EF of [id number], grade 1 diastolic dysfunction    COLD (chronic obstructive lung disease) ([place]) - Currently stable, no wheezing    Type 2 diabetes mellitus with complication ([place]) - Continue sliding scale insulin    Fall - PT evaluation  ? Abdominal pain, has a history of chronic abdominal pain: Possibly due to low-flow mesenteric ischemia - KUB negative for any acute SBO - MRA in 8/16 had shown no significant mesenteric vascular obstruction, EGD in 8/16 was unremarkable. Colonoscopy in 8/16 only showed polyps.  Left ventricular apical thrombus/CAD, pulmonary hypertension - Currently on Coumadin  Code Status:DO NOT RESUSCITATE   Family Communication: No family member at the bedside  Disposition Plan:   Time Spent in minutes  25  minutes  Procedures  KUB  Consults    Palliative medicine  DVT Prophylaxis warfarin  Medications  Scheduled Meds: . atorvastatin  20 mg Oral QHS  . carvedilol  12.5 mg Oral BID WC  . famotidine  20 mg Oral Daily  .  feeding supplement  1 Container Oral TID BM  . insulin aspart  0-9 Units Subcutaneous 6 times per day  . lipase/protease/amylase  24,000 Units Oral QAC supper  . mometasone-formoterol  2 puff Inhalation BID  . sodium bicarbonate  650 mg Oral BID  . sodium chloride  500 mL Intravenous Once  . sodium chloride  3 mL Intravenous Q12H  . warfarin  2 mg Oral Daily   Continuous Infusions: . sodium chloride 100 mL (06/14/15 0802)   PRN Meds:.sodium chloride, acetaminophen **OR** acetaminophen, acetaminophen, albuterol, alum & mag hydroxide-simeth, hydrALAZINE, HYDROmorphone (DILAUDID) injection, HYDROmorphone, ondansetron **OR** ondansetron (ZOFRAN) IV, promethazine, sodium chloride   Antibiotics   Anti-infectives    None        Subjective:   [name] was seen and examined today. Denies any  specific complaints except abdominal pain. No vomiting.  Patient denies dizziness, chest pain, shortness of breath, No acute events overnight.    Objective:   Blood pressure 176/81, pulse 84, temperature 98.4 F (36.9 C), temperature source Oral, resp. rate 22, weight 66.497 kg (146 lb 9.6 oz), SpO2 100 %.  Wt Readings from Last 3 Encounters:  06/13/15 66.497 kg (146 lb 9.6 oz)  04/29/15 71.8 kg (158 lb 4.6 oz)  04/24/15 72.576 kg (160 lb)     Intake/Output Summary (Last 24 hours) at 06/14/15 1012 Last data filed at 06/14/15 0908  Gross per 24 hour  Intake    100 ml  Output    100 ml  Net      0 ml    Exam  General: Alert and oriented x 1, NAD  HEENT:  PERRLA, EOMI, Anicteric Sclera, mucous membranes moist.   Neck: Supple, no JVD, no masses  CVS: S1 S2 auscultated, no rubs, murmurs or gallops. Regular rate and rhythm.  Respiratory: Clear to auscultation bilaterally, no wheezing, rales or rhonchi  Abdomen: Soft, minimal tenderness diffusely , nondistended, + bowel sounds  Ext: no cyanosis clubbing or edema  Neuro: AAOx1, moving all 4 extremities  Skin: No rashes  Psych: Normal affect and demeanor, alert and oriented x1   Data Review   Micro Results No results found for this or any previous visit (from the past 240 hour(s)).  Radiology Reports Dg Chest 2 View  06/13/2015  CLINICAL DATA:  Patient was found this morning after falling last night at 2100. Family found her this morning Alert and Oriented per baseline. Patient is a hospice patient for renal failure. Reported patient had one episode of vomiting. EXAM: CHEST  2 VIEW COMPARISON:  04/24/2015 FINDINGS: The heart is enlarged. Shallow lung inflation. There are no focal consolidations or pleural effusions. No pulmonary edema. There are small bilateral pleural effusions. Surgical clips are seen in the upper abdomen. IMPRESSION: 1. Cardiomegaly without pulmonary edema. 2. Small bilateral effusions. Electronically  Signed   By: Nolon Nations M.D.   On: 06/13/2015 14:26   Dg Pelvis 1-2 Views  06/13/2015  CLINICAL DATA:  [place] last night.  Pelvic pain. EXAM: PELVIS - 1-2 VIEW COMPARISON:  CT scan 03/09/2015. FINDINGS: Both hips are normally located. No acute hip fracture or plain film evidence of avascular necrosis. The pubic symphysis and SI joints are intact. No pelvic fractures or bone lesions. Extensive vascular calcifications are noted. IMPRESSION: No acute bony findings. Electronically Signed   By: Marijo Sanes M.D.   On: 06/13/2015 14:26   Ct Head Wo Contrast  06/13/2015  CLINICAL DATA:  Recent fall last evening with headaches and  neck pain EXAM: CT HEAD WITHOUT CONTRAST CT CERVICAL SPINE WITHOUT CONTRAST TECHNIQUE: Multidetector CT imaging of the head and cervical spine was performed following the standard protocol without intravenous contrast. Multiplanar CT image reconstructions of the cervical spine were also generated. COMPARISON:  04/24/2015 FINDINGS: CT HEAD FINDINGS The bony calvarium is intact. No gross soft tissue abnormality is noted. Mild atrophic changes are seen. No findings to suggest acute hemorrhage, acute infarction or space-occupying mass lesion are noted. CT CERVICAL SPINE FINDINGS Seven cervical segments are well visualized. Vertebral body height is well maintained osteophytic changes are noted from C4-C7 with mild degrees of neural foraminal narrowing. Facet hypertrophic changes are seen without evidence of acute fracture. There is a small defect in the right lateral aspect of the C1 ring best seen on image number 21. This is chronic in nature and is been shown on multiple previous exams dating back to 2013. No gross soft tissue abnormality is noted aside from a multinodular goiter. The dominant nodule measures 14 mm in greatest dimension. Visualized lung apices are within normal limits. IMPRESSION: CT of the head: Mild atrophic changes without acute abnormality. CT of the cervical spine:  Degenerative change without acute abnormality. Multinodular goiter. Dominant right-sided nodule measures 14 mm. Findings do not meet current SRU consensus criteria for biopsy. Follow-up by clinical exam is recommended. If patient has known risk factors for thyroid carcinoma, consider follow-up ultrasound in 12 months. If patient is clinically hyperthyroid, consider nuclear medicine thyroid uptake and scan. Reference: Management of Thyroid Nodules Detected at [place]: Society of Radiologists in [place]. Radiology 2005; [id number]. Electronically Signed   By: Inez Catalina M.D.   On: 06/13/2015 14:21   Ct Cervical Spine Wo Contrast  06/13/2015  CLINICAL DATA:  Recent fall last evening with headaches and neck pain EXAM: CT HEAD WITHOUT CONTRAST CT CERVICAL SPINE WITHOUT CONTRAST TECHNIQUE: Multidetector CT imaging of the head and cervical spine was performed following the standard protocol without intravenous contrast. Multiplanar CT image reconstructions of the cervical spine were also generated. COMPARISON:  04/24/2015 FINDINGS: CT HEAD FINDINGS The bony calvarium is intact. No gross soft tissue abnormality is noted. Mild atrophic changes are seen. No findings to suggest acute hemorrhage, acute infarction or space-occupying mass lesion are noted. CT CERVICAL SPINE FINDINGS Seven cervical segments are well visualized. Vertebral body height is well maintained osteophytic changes are noted from C4-C7 with mild degrees of neural foraminal narrowing. Facet hypertrophic changes are seen without evidence of acute fracture. There is a small defect in the right lateral aspect of the C1 ring best seen on image number 21. This is chronic in nature and is been shown on multiple previous exams dating back to 2013. No gross soft tissue abnormality is noted aside from a multinodular goiter. The dominant nodule measures 14 mm in greatest dimension. Visualized lung apices are within normal limits.  IMPRESSION: CT of the head: Mild atrophic changes without acute abnormality. CT of the cervical spine: Degenerative change without acute abnormality. Multinodular goiter. Dominant right-sided nodule measures 14 mm. Findings do not meet current SRU consensus criteria for biopsy. Follow-up by clinical exam is recommended. If patient has known risk factors for thyroid carcinoma, consider follow-up ultrasound in 12 months. If patient is clinically hyperthyroid, consider nuclear medicine thyroid uptake and scan. Reference: Management of Thyroid Nodules Detected at [place]: Society of Radiologists in [place]. Radiology 2005; [id number]. Electronically Signed   By: Inez Catalina M.D.   On: 06/13/2015 14:21  Dg Abd 2 Views  06/14/2015  CLINICAL DATA:  Abdominal pain EXAM: ABDOMEN - 2 VIEW COMPARISON:  CT abdomen pelvis dated 03/09/2015 FINDINGS: Mildly prominent but nondilated loops of small bowel in the lower abdomen. No evidence of free air on the lateral decubitus view. Cholecystectomy clips. Additional surgical clips in the left upper abdomen. Mild degenerative changes the lower lumbar spine. IMPRESSION: No evidence of small bowel obstruction or free air. Electronically Signed   By: Julian Hy M.D.   On: 06/14/2015 09:45    CBC  Recent Labs Lab 06/13/15 1630 06/14/15 0549  WBC 14.2* 15.7*  HGB 18.4* 17.3*  HCT 53.5* 51.1*  PLT 398 426*  MCV 89.8 92.1  MCH 30.9 31.2  MCHC 34.4 33.9  RDW 12.5 13.0  LYMPHSABS 1.9  --   MONOABS 1.0  --   EOSABS 0.0  --   BASOSABS 0.0  --     Chemistries   Recent Labs Lab 06/13/15 1630 06/14/15 0549  NA 140 140  K 3.9 3.6  CL 102 101  CO2 23 26  GLUCOSE 221* 208*  BUN 28* 27*  CREATININE 3.06* 2.90*  CALCIUM 12.2* 11.5*   ------------------------------------------------------------------------------------------------------------------ estimated creatinine clearance is 17.2 mL/min (by C-G formula based on Cr of  2.9). ------------------------------------------------------------------------------------------------------------------ No results for input(s): HGBA1C in the last 72 hours. ------------------------------------------------------------------------------------------------------------------ No results for input(s): CHOL, HDL, LDLCALC, TRIG, CHOLHDL, LDLDIRECT in the last 72 hours. ------------------------------------------------------------------------------------------------------------------ No results for input(s): TSH, T4TOTAL, T3FREE, THYROIDAB in the last 72 hours.  Invalid input(s): FREET3 ------------------------------------------------------------------------------------------------------------------ No results for input(s): VITAMINB12, FOLATE, FERRITIN, TIBC, IRON, RETICCTPCT in the last 72 hours.  Coagulation profile  Recent Labs Lab 06/09/15 06/13/15 2326 06/14/15 0549  INR 3.1 3.82* 4.08*    No results for input(s): DDIMER in the last 72 hours.  Cardiac Enzymes No results for input(s): CKMB, TROPONINI, MYOGLOBIN in the last 168 hours.  Invalid input(s): CK ------------------------------------------------------------------------------------------------------------------ Invalid input(s): [place]  06/13/15 2300 06/14/15 0353 06/14/15 0741  GLUCAP 259* 220* 202*     RAI,RIPUDEEP M.D. [facility] 06/14/2015, 10:12 AM  Pager: [phone] Between 7am to 7pm - call Pager - [phone]  After 7pm go to [email or web address] - password [email or web address]  Call night coverage person covering after 7pm

## 2015-06-14 NOTE — Evaluation (Signed)
Physical Therapy Evaluation Patient Details Name: [name] MRN: [id number] DOB: 1946-07-29 Today's Date: 06/14/2015   History of Present Illness  Imoni A Avilla is a 69 y.o. female with Multiple Medical Problems including ESRD not on Dialysis Rx, and Sarcoidosis and DM2 who was brought to the ED by her family after a Fall last night in her home. PEr her Family, she has had increased weakness and has had continued decline and poor intake of foods and liquids over the past week. She has also been less interactive and more confused.  Clinical Impression  Pt admitted with above diagnosis. Pt currently with functional limitations due to the deficits listed below (see PT Problem List). Pt opened eyes and participated minimally with sitting EOB but otherwise did not interact with therapist or her family during session.  Pt will benefit from skilled PT to increase their independence and safety with mobility to allow discharge to the venue listed below.  Will put pt on 2x trial to see if her cognitive status changes and ability to participate with therapy, family agreeable.      Follow Up Recommendations SNF;Supervision/Assistance - 24 hour    Equipment Recommendations  None recommended by PT    Recommendations for Other Services       Precautions / Restrictions Precautions Precautions: Fall Restrictions Weight Bearing Restrictions: No      Mobility  Bed Mobility Overal bed mobility: Needs Assistance;+2 for physical assistance Bed Mobility: Supine to Sit;Sit to Supine     Supine to sit: Mod assist Sit to supine: Mod assist;+2 for physical assistance   General bed mobility comments: pt did not initiate moving into sitting but did assist with trunk control and grasped rail to roll for short time. Trunk activiation noted with sitting up but once upright, trunk activation was intermittent. Max A to return to supine and +2 tot A to scoot up in bed.   Transfers                  General transfer comment: pt unable  Ambulation/Gait             General Gait Details: pt unable  Stairs            Wheelchair Mobility    Modified Rankin (Stroke Patients Only)       Balance Overall balance assessment: Needs assistance;History of Falls Sitting-balance support: Bilateral upper extremity supported;Feet unsupported Sitting balance-Leahy Scale: Poor Sitting balance - Comments: pt sat with min to max A. Would sometimes hold self up but would lose balance in all planes after a few seconds with no righting reactions noted.                                      Pertinent Vitals/Pain Pain Assessment: Faces Faces Pain Scale: No hurt Pain Location: just received pain meds for abdominal pain    Home Living Family/patient expects to be discharged to:: Skilled nursing facility                 Additional Comments: [place], per chart    Prior Function Level of Independence: Needs assistance   Gait / Transfers Assistance Needed: ambulates with 4 wheel RW, had just finished rehab at [facility] after last admission, and gone back home  ADL's / Homemaking Assistance Needed: had help from family and aide  Comments: family relays that pt did well at [employer]  Hand Dominance   Dominant Hand: Right    Extremity/Trunk Assessment   Upper Extremity Assessment: Generalized weakness           Lower Extremity Assessment: Generalized weakness      Cervical / Trunk Assessment: Kyphotic  Communication   Communication: Expressive difficulties;Receptive difficulties (no communication)  Cognition Arousal/Alertness: Lethargic;Suspect due to medications Behavior During Therapy: Flat affect Overall Cognitive Status: Impaired/Different from baseline Area of Impairment: Orientation;Attention;Memory;Following commands;Awareness;Problem solving Orientation Level: Disoriented to;Person;Place;Time;Situation Current Attention  Level: Focused   Following Commands: Follows one step commands inconsistently   Awareness: Intellectual Problem Solving: Decreased initiation;Slow processing;Requires verbal cues;Requires tactile cues General Comments: pt opened eyes on command and to touch and participated intermittently with sitting EOB, did not attempt to speak to therapist or sisters as they spoke to her    General Comments General comments (skin integrity, edema, etc.): family expresses that if improvement not expected they would like pt to go to [facility].     Exercises        Assessment/Plan    PT Assessment Patient needs continued PT services  PT Diagnosis Generalized weakness;Altered mental status   PT Problem List Decreased strength;Decreased range of motion;Decreased activity tolerance;Decreased balance;Decreased mobility;Decreased cognition;Decreased coordination  PT Treatment Interventions Therapeutic activities;Therapeutic exercise;Balance training;Neuromuscular re-education;Cognitive remediation;Patient/family education   PT Goals (Current goals can be found in the Care Plan section) Acute Rehab PT Goals Patient Stated Goal: none stated PT Goal Formulation: With family Time For Goal Achievement: 07/26/15 Potential to Achieve Goals: Poor    Frequency Min 2X/week   Barriers to discharge Decreased caregiver support was alone at home    Co-evaluation               End of Session   Activity Tolerance: Patient limited by lethargy Patient left: in bed;with bed alarm set;with family/visitor present Nurse Communication: Mobility status    Functional Assessment Tool Used: clinical judgement Functional Limitation: Mobility: Walking and moving around Mobility: Walking and Moving Around Current Status [id number]): 100 percent impaired, limited or restricted Mobility: Walking and Moving Around Goal Status [id number]): At least 40 percent but less than 60 percent impaired, limited or restricted     Time: 1145-1200 PT Time Calculation (min) (ACUTE ONLY): 15 min   Charges:   PT Evaluation $Initial PT Evaluation Tier I: 1 Procedure     PT G Codes:   PT G-Codes **NOT FOR INPATIENT CLASS** Functional Assessment Tool Used: clinical judgement Functional Limitation: Mobility: Walking and moving around Mobility: Walking and Moving Around Current Status [id number]): 100 percent impaired, limited or restricted Mobility: Walking and Moving Around Goal Status [id number]): At least 40 percent but less than 60 percent impaired, limited or restricted   Leighton Roach, PT  Acute Rehab Services  [phone]  Leighton Roach 06/14/2015, 1:47 PM

## 2015-06-14 NOTE — Progress Notes (Signed)
Rn sent a note via AMI ON  covering [facility] ([place] ) informing   Patient having no urine ouput since 06/13/15. Orders given to bladder scan

## 2015-06-14 NOTE — Consult Note (Signed)
Consultation Note Date: 06/14/2015   Patient Name: [name]  DOB: 12-11-1945  MRN: [id number]  Age / Sex: 69 y.o., female  PCP: Antony Blackbird, MD Referring Physician: Mendel Corning, MD  Reason for Consultation: Disposition and palliative care follow-up for a hospice patient    Clinical Assessment/Narrative: Patient is a 69 year old lady who is currently enrolled in hospice, for the past 3-4 months for hospice qualifying condition of chronic systolic heart failure. Patient has past medical history significant for stage IV chronic kidney disease, hypertension, chronic obstructive pulmonary disease, diabetes. Patient lives at home by herself. Patient's sister [name] is her medical power of attorney and primary caregiver. She checks on her frequently. The patient has had ongoing decline, minimal to nil oral intake, falls and increasing weakness for the past few weeks. Patient has been admitted with generalized weakness, fall, accelerated hypertension, abdominal pain.  Palliative consult requested for following up on hospice patient admitted to the hospital as well as to address appropriate disposition options.   The patient is resting in bed. She does not arouse to gentle stimulation. Patient's sister in the room states that she was a little bit awake earlier and was responding still some to some simple yes/no type questions. She has received pain medication for abdominal pain and is resting comfortably. She continues to be weak and confused.   Discussed with the patient's sister extensively. Appreciate hospice follow-up. Discussed with sister about dying/end-of-life trajectory. Goals are for the patient to be kept as comfortable as possible. Patient's sister wishes to observe the patient over the weekend. She states that she is deciding between [facility] or skilled nursing facility for the patient. She believes that  the patient is not able to live by herself anymore and I agree with her.  Palliative consult continues   It remains to be seen whether poor intake of foods and liquids over the past week. She has also been less interactive and more confused. Options were discussed and Social work was consulted and [name] had agre    Contacts/Participants in Discussion: Patient's sister [name] at [phone] Primary Decision Maker: Sister Relationship to Patient sister HCPOA: yes    SUMMARY OF RECOMMENDATIONS Continue to observe disease trajectory the hospital for another 24-48 hours next line goals are comfort measures only, DO NOT RESUSCITATE/DO NOT INTUBATE Will possibly transition to the [facility] based on the patient's current condition. His has been discussed with the patient's sister healthcare power of attorney agent [name]  Code Status/Advance Care Planning: DNR    Code Status Orders        Start     Ordered   06/13/15 2215  Do not attempt resuscitation (DNR)   Continuous    Question Answer Comment  In the event of cardiac or respiratory ARREST Do not call a "code blue"   In the event of cardiac or respiratory ARREST Do not perform Intubation, CPR, defibrillation or ACLS   In the event of cardiac or respiratory ARREST Use medication by any route, position, wound care, and other measures to relive pain and suffering. May use oxygen, suction and manual treatment of airway obstruction as needed for comfort.      06/13/15 2216    Advance Directive Documentation        Most Recent Value   Type of Advance Directive  Out of facility DNR (pink MOST or yellow form)   Pre-existing out of facility DNR order (yellow form or pink  MOST form)  Yellow form placed in chart (order not valid for inpatient use)   "MOST" Form in Place?        Other Directives:Advanced Directive  Symptom Management:   Agree with Dilaudid IV for pain management  Palliative  Prophylaxis:   Bowel Regimen  Additional Recommendations (Limitations, Scope, Preferences):  Full Comfort Care    Psycho-social/Spiritual:  Support System: [place] Desire for further Chaplaincy support:no Additional Recommendations: Education on Hospice  Prognosis: < 2 weeks  Discharge Planning: Hospice facility   Chief Complaint/ Primary Diagnoses: Present on Admission:  . Fall . Malnutrition of moderate degree ([place]) . Chronic systolic congestive heart failure ([place]) . COLD (chronic obstructive lung disease) ([place]) . Type 2 diabetes mellitus with complication ([place]) . Encephalopathy . Essential hypertension . Chronic kidney disease (CKD), stage IV (severe) ([place])  I have reviewed the medical record, interviewed the patient and family, and examined the patient. The following aspects are pertinent.  Past Medical History  Diagnosis Date  . Essential hypertension, benign   . Coronary atherosclerosis of native coronary artery 01/04/2006    Tiny OM1 70-90% ostial stenosis, no other CAD  . COPD (chronic obstructive pulmonary disease) ([place])   . Esophageal reflux   . Dyslipidemia   . Gout   . Colon polyps 2011    Tubular adenomatous polyps  . Spinal stenosis of lumbar region     chronic low back pain.   . Bulging lumbar disc   . Chronic diastolic heart failure ([place])   . PNA (pneumonia) 2012    With pleural effusion, requiring thoracentesis  . Chronic bronchitis ([place])   . On home oxygen therapy   . OSA (obstructive sleep apnea)     Marginally compliant with CPAP  . Type II diabetes mellitus ([place])   . Arthritis   . Diverticulosis   . GERD (gastroesophageal reflux disease)   . Anxiety   . Hyperlipidemia   . Sarcoidosis of lung ([place])   . CKD (chronic kidney disease) stage 4, GFR 15-29 ml/min ([facility])   . IBS (irritable bowel syndrome)   . Cataracts, both eyes   . Headache(784.0)   . Lactic acidosis 01/21/2015  . Chronic CHF (congestive heart failure) ([facility])    Social History    Social History  . Marital Status: Single    Spouse Name: N/A  . Number of Children: 0  . Years of Education: N/A   Occupational History  . Retired   . SECRETARY    Social History Main Topics  . Smoking status: Never Smoker   . Smokeless tobacco: Never Used  . Alcohol Use: No  . Drug Use: No  . Sexual Activity: Not Currently   Other Topics Concern  . None   Social History Narrative   Lives in [place] alone.  Never married.   Retired Art therapist at [employer] History  Problem Relation Age of Onset  . Diabetes Mother   . Heart disease Mother   . Hyperlipidemia Mother   . Varicose Veins Mother   . Breast cancer Maternal Aunt   . Heart disease Father   . Deep vein thrombosis Father   . Hyperlipidemia Father   . Heart disease Sister     before age 23  . Cancer Sister   . Diabetes Sister   . Hyperlipidemia Sister   . Heart attack Sister   . Heart disease Brother   . Diabetes Brother   . Hyperlipidemia Brother   . Heart attack Brother   .  Hyperlipidemia Sister    Scheduled Meds: . atorvastatin  20 mg Oral QHS  . carvedilol  12.5 mg Oral BID WC  . famotidine  20 mg Oral Daily  . feeding supplement  1 Container Oral TID BM  . insulin aspart  0-9 Units Subcutaneous 6 times per day  . lipase/protease/amylase  24,000 Units Oral QAC supper  . mometasone-formoterol  2 puff Inhalation BID  . sodium bicarbonate  650 mg Oral BID  . sodium chloride  500 mL Intravenous Once  . sodium chloride  3 mL Intravenous Q12H  . [START ON 06/15/2015] Warfarin - Pharmacist Dosing Inpatient   Does not apply q1800   Continuous Infusions: . sodium chloride 50 mL/hr at 06/14/15 1134   PRN Meds:.sodium chloride, acetaminophen **OR** acetaminophen, acetaminophen, alum & mag hydroxide-simeth, hydrALAZINE, HYDROmorphone (DILAUDID) injection, HYDROmorphone, ondansetron **OR** ondansetron (ZOFRAN) IV, promethazine, sodium chloride Medications Prior to Admission:  Prior to  Admission medications   Medication Sig Start Date End Date Taking? Authorizing Provider  acetaminophen (TYLENOL) 500 MG tablet Take 1,000 mg by mouth 2 (two) times daily as needed (pain).    Yes Historical Provider, MD  albuterol (PROVENTIL HFA;VENTOLIN HFA) 108 (90 BASE) MCG/ACT inhaler Inhale 2 puffs into the lungs every 4 (four) hours as needed for wheezing or shortness of breath.    Yes Historical Provider, MD  albuterol (PROVENTIL) (2.5 MG/3ML) 0.083% nebulizer solution Take 2.5 mg by nebulization every 2 (two) hours as needed for wheezing or shortness of breath.   Yes Historical Provider, MD  atorvastatin (LIPITOR) 20 MG tablet Take 20 mg by mouth at bedtime.   Yes Historical Provider, MD  carvedilol (COREG) 3.125 MG tablet Take 1 tablet (3.125 mg total) by mouth 2 (two) times daily with a meal. 04/06/15  Yes Robbie Lis, MD  esomeprazole (NEXIUM) 40 MG capsule Take 1 capsule by mouth 30 min before breakfast. Patient taking differently: Take 40 mg by mouth daily.  03/03/15  Yes Amy S Esterwood, PA-C  famotidine (PEPCID) 20 MG tablet Take 1 tablet (20 mg total) by mouth daily. 02/02/15  Yes Ripudeep Krystal Eaton, MD  feeding supplement (BOOST / RESOURCE BREEZE) LIQD Take 1 Container by mouth 3 (three) times daily between meals. 03/06/15  Yes Shanker Kristeen Mans, MD  Fluticasone-Salmeterol (ADVAIR) 250-50 MCG/DOSE AEPB Inhale 1 puff into the lungs 2 (two) times daily. 09/25/14  Yes Noralee Space, MD  hydrocortisone (ANUSOL-HC) 2.5 % rectal cream Place 1 application rectally 4 (four) times daily. 02/28/15  Yes Amy S Esterwood, PA-C  HYDROmorphone (DILAUDID) 2 MG tablet Take 2-4 mg by mouth every 4 (four) hours as needed for moderate pain or severe pain.   Yes Historical Provider, MD  insulin lispro (HUMALOG KWIKPEN) 100 UNIT/ML KiwkPen Inject 0.01-0.1 mLs (1-10 Units total) into the skin 3 (three) times daily. Sliding scale CBG 70 - 120: 0 units CBG 121 - 150: 1 unit,  CBG 151 - 200: 2 units,  CBG 201 - 250: 3  units,  CBG 251 - 300: 5 units,  CBG 301 - 350: 7 units,  CBG 351 - 400: 9 units   CBG > 400: 10 units and notify your MD 02/02/15  Yes Ripudeep K Rai, MD  ondansetron (ZOFRAN) 4 MG tablet Take 1 tablet (4 mg total) by mouth every 8 (eight) hours as needed for nausea or vomiting. 04/06/15  Yes Robbie Lis, MD  Pancrelipase, Lip-Prot-Amyl, (CREON) 24000 UNITS CPEP Take 24,000 Units by mouth daily.   Yes  Historical Provider, MD  promethazine (PHENERGAN) 25 MG tablet Take 1 tablet (25 mg total) by mouth every 4 (four) hours as needed for nausea or vomiting. 02/02/15  Yes Ripudeep Krystal Eaton, MD  sodium bicarbonate 650 MG tablet Take 1 tablet (650 mg total) by mouth 2 (two) times daily. 02/02/15  Yes Ripudeep Krystal Eaton, MD  warfarin (COUMADIN) 1 MG tablet Take 2 mg by mouth daily.   Yes Historical Provider, MD  ferrous sulfate 325 (65 FE) MG tablet Take 325 mg by mouth daily with breakfast.    Historical Provider, MD  HYDROcodone-acetaminophen (NORCO/VICODIN) 5-325 MG per tablet Take 1 tablet by mouth every 4 (four) hours as needed for moderate pain. 04/28/15   Maryann Mikhail, DO  warfarin (COUMADIN) 1 MG tablet Take 1.5 tablets (1.5 mg total) by mouth every evening. 04/06/15   Robbie Lis, MD   Allergies  Allergen Reactions  . Adhesive [Tape] Other (See Comments)    Blisters   . Avelox [Moxifloxacin Hcl In Nacl] Other (See Comments)    GI upset  . Codeine Other (See Comments)    "crazy"   . Guaifenesin Nausea And Vomiting    Takes Mucinex at home without issue  . Latex Swelling  . Oxycodone Nausea And Vomiting    Takes Percocet at home without issue    Review of Systems Unable to be obtained as patient is lethargic/encephalopathic Physical Exam Lethargic/encephalopathic Regular breathing S1-S2 Trace edema Abdomen soft mild distention  Vital Signs: BP 114/49 mmHg  Pulse 82  Temp(Src) 98.4 F (36.9 C) (Oral)  Resp 19  Wt 66.497 kg (146 lb 9.6 oz)  SpO2 98%  SpO2: SpO2: 98 % O2 Device:SpO2:  98 % O2 Flow Rate: .   IO: Intake/output summary:  Intake/Output Summary (Last 24 hours) at 06/14/15 1736 Last data filed at 06/14/15 1513  Gross per 24 hour  Intake    250 ml  Output    100 ml  Net    150 ml    LBM:   Baseline Weight: Weight: 66.497 kg (146 lb 9.6 oz) Most recent weight: Weight: 66.497 kg (146 lb 9.6 oz)      Palliative Assessment/Data:   palliative performance scale 20%  Additional Data Reviewed:  CBC:    Component Value Date/Time   WBC 15.7* 06/14/2015 0549   WBC 4.6 07/17/2010 1551   HGB 17.3* 06/14/2015 0549   HGB 10.5* 07/17/2010 1551   HCT 51.1* 06/14/2015 0549   HCT 31.5* 07/17/2010 1551   PLT 426* 06/14/2015 0549   PLT 230 07/17/2010 1551   MCV 92.1 06/14/2015 0549   MCV 84.9 07/17/2010 1551   NEUTROABS 11.3* 06/13/2015 1630   NEUTROABS 3.2 07/17/2010 1551   LYMPHSABS 1.9 06/13/2015 1630   LYMPHSABS 0.8* 07/17/2010 1551   MONOABS 1.0 06/13/2015 1630   MONOABS 0.5 07/17/2010 1551   EOSABS 0.0 06/13/2015 1630   EOSABS 0.2 07/17/2010 1551   BASOSABS 0.0 06/13/2015 1630   BASOSABS 0.0 07/17/2010 1551   Comprehensive Metabolic Panel:    Component Value Date/Time   NA 140 06/14/2015 0549   K 3.6 06/14/2015 0549   CL 101 06/14/2015 0549   CO2 26 06/14/2015 0549   BUN 27* 06/14/2015 0549   CREATININE 2.90* 06/14/2015 0549   GLUCOSE 208* 06/14/2015 0549   CALCIUM 11.5* 06/14/2015 0549   CALCIUM 12.8* 10/17/2012 1734   AST 24 04/25/2015 0421   ALT 12* 04/25/2015 0421   ALKPHOS 109 04/25/2015 0421   BILITOT 0.5  04/25/2015 0421   PROT 4.9* 04/25/2015 0421   ALBUMIN 2.2* 04/25/2015 0421     Time In: 1600 Time Out: 1700 Time Total: 60 Greater than 50%  of this time was spent counseling and coordinating care related to the above assessment and plan.  Signed by: Loistine Chance, MD [phone] Loistine Chance, MD  06/14/2015, 5:36 PM  Please contact Palliative Medicine Team phone at [phone] for questions and concerns.

## 2015-06-15 ENCOUNTER — Encounter (HOSPITAL_COMMUNITY): Payer: Self-pay | Admitting: *Deleted

## 2015-06-15 DIAGNOSIS — I10 Essential (primary) hypertension: Secondary | ICD-10-CM

## 2015-06-15 DIAGNOSIS — E440 Moderate protein-calorie malnutrition: Secondary | ICD-10-CM

## 2015-06-15 DIAGNOSIS — W19XXXD Unspecified fall, subsequent encounter: Secondary | ICD-10-CM

## 2015-06-15 LAB — PROTIME-INR
INR: 4.61 — AB (ref 0.00–1.49)
Prothrombin Time: 42.3 seconds — ABNORMAL HIGH (ref 11.6–15.2)

## 2015-06-15 LAB — CBC
HEMATOCRIT: 44.1 % (ref 36.0–46.0)
Hemoglobin: 14.1 g/dL (ref 12.0–15.0)
MCH: 30.2 pg (ref 26.0–34.0)
MCHC: 32 g/dL (ref 30.0–36.0)
MCV: 94.4 fL (ref 78.0–100.0)
PLATELETS: 377 10*3/uL (ref 150–400)
RBC: 4.67 MIL/uL (ref 3.87–5.11)
RDW: 13.4 % (ref 11.5–15.5)
WBC: 10.5 10*3/uL (ref 4.0–10.5)

## 2015-06-15 LAB — BASIC METABOLIC PANEL
Anion gap: 10 (ref 5–15)
BUN: 35 mg/dL — AB (ref 6–20)
CHLORIDE: 107 mmol/L (ref 101–111)
CO2: 24 mmol/L (ref 22–32)
CREATININE: 4.36 mg/dL — AB (ref 0.44–1.00)
Calcium: 10.1 mg/dL (ref 8.9–10.3)
GFR calc Af Amer: 11 mL/min — ABNORMAL LOW (ref >60)
GFR calc non Af Amer: 10 mL/min — ABNORMAL LOW (ref >60)
GLUCOSE: 114 mg/dL — AB (ref 65–99)
POTASSIUM: 3.4 mmol/L — AB (ref 3.5–5.1)
SODIUM: 141 mmol/L (ref 135–145)

## 2015-06-15 LAB — GLUCOSE, CAPILLARY
GLUCOSE-CAPILLARY: 145 mg/dL — AB (ref 65–99)
Glucose-Capillary: 100 mg/dL — ABNORMAL HIGH (ref 65–99)
Glucose-Capillary: 113 mg/dL — ABNORMAL HIGH (ref 65–99)
Glucose-Capillary: 119 mg/dL — ABNORMAL HIGH (ref 65–99)

## 2015-06-15 MED ORDER — ONDANSETRON 4 MG PO TBDP: 4.0000 mg | ORAL_TABLET | Freq: Three times a day (TID) | ORAL | Status: AC | PRN

## 2015-06-15 MED ORDER — MORPHINE SULFATE (CONCENTRATE) 10 MG/0.5ML PO SOLN: 5.0000 mg | ORAL | Status: DC | PRN

## 2015-06-15 MED ORDER — SENNA 8.6 MG PO TABS: 2.0000 | ORAL_TABLET | Freq: Every day | ORAL | Status: AC

## 2015-06-15 MED ORDER — ACETAMINOPHEN 325 MG PO TABS: 650.0000 mg | ORAL_TABLET | Freq: Four times a day (QID) | ORAL | Status: AC | PRN

## 2015-06-15 MED ORDER — MORPHINE SULFATE (CONCENTRATE) 10 MG/0.5ML PO SOLN: 5.0000 mg | ORAL | Status: AC | PRN

## 2015-06-15 NOTE — Discharge Summary (Signed)
Physician Discharge Summary   Patient ID: [name] MRN: [id number] DOB/AGE: [birth year] 69 y.o.  Admit date: 06/13/2015 Discharge date: 06/15/2015  Primary Care Physician:  Antony Blackbird, MD  Discharge Diagnoses:    . Fall with adult failure to thrive  . Malnutrition of moderate degree ([place]) . Accelerated hypertension . Acute on Chronic kidney disease (CKD), stage IV (severe) ([place]) . Acute encephalopathy  . Chronic systolic congestive heart failure ([place]) . COLD (chronic obstructive lung disease) ([place]) . Type 2 diabetes mellitus with complication [facility])    Consults:  Palliative medicine   Recommendations for Outpatient Follow-up:  Patient is DNR/DNI, comfort care goals  TESTS THAT NEED FOLLOW-UP None    DIET: Comfort food    Allergies:   Allergies  Allergen Reactions  . Adhesive [Tape] Other (See Comments)    Blisters   . Avelox [Moxifloxacin Hcl In Nacl] Other (See Comments)    GI upset  . Codeine Other (See Comments)    "crazy"   . Guaifenesin Nausea And Vomiting    Takes Mucinex at home without issue  . Latex Swelling  . Oxycodone Nausea And Vomiting    Takes Percocet at home without issue     Discharge Medications:   Medication List    STOP taking these medications        atorvastatin 20 MG tablet  Commonly known as:  LIPITOR     carvedilol 3.125 MG tablet  Commonly known as:  COREG     CREON 24000 UNITS Cpep  Generic drug:  Pancrelipase (Lip-Prot-Amyl)     esomeprazole 40 MG capsule  Commonly known as:  NEXIUM     famotidine 20 MG tablet  Commonly known as:  PEPCID     feeding supplement Liqd     ferrous sulfate 325 (65 FE) MG tablet     Fluticasone-Salmeterol 250-50 MCG/DOSE Aepb  Commonly known as:  ADVAIR     HYDROcodone-acetaminophen 5-325 MG tablet  Commonly known as:  NORCO/VICODIN     HYDROmorphone 2 MG tablet  Commonly known as:  DILAUDID     ondansetron 4 MG tablet  Commonly known as:  ZOFRAN      promethazine 25 MG tablet  Commonly known as:  PHENERGAN     sodium bicarbonate 650 MG tablet     warfarin 1 MG tablet  Commonly known as:  COUMADIN      TAKE these medications        acetaminophen 325 MG tablet  Commonly known as:  TYLENOL  Take 2 tablets (650 mg total) by mouth every 6 (six) hours as needed for mild pain (or Fever >/= 101).     albuterol (2.5 MG/3ML) 0.083% nebulizer solution  Commonly known as:  PROVENTIL  Take 2.5 mg by nebulization every 2 (two) hours as needed for wheezing or shortness of breath.     hydrocortisone 2.5 % rectal cream  Commonly known as:  ANUSOL-HC  Place 1 application rectally 4 (four) times daily.     insulin lispro 100 UNIT/ML KiwkPen  Commonly known as:  HUMALOG KWIKPEN  Inject 0.01-0.1 mLs (1-10 Units total) into the skin 3 (three) times daily. Sliding scale CBG 70 - 120: 0 units CBG 121 - 150: 1 unit,  CBG 151 - 200: 2 units,  CBG 201 - 250: 3 units,  CBG 251 - 300: 5 units,  CBG 301 - 350: 7 units,  CBG 351 - 400: 9 units   CBG > 400: 10 units and notify  your MD     morphine CONCENTRATE 10 MG/0.5ML Soln concentrated solution  Take 0.25 mLs (5 mg total) by mouth every 4 (four) hours as needed for severe pain or shortness of breath.     ondansetron 4 MG disintegrating tablet  Commonly known as:  ZOFRAN ODT  Take 1 tablet (4 mg total) by mouth every 8 (eight) hours as needed for nausea or vomiting.     senna 8.6 MG Tabs tablet  Commonly known as:  SENOKOT  Take 2 tablets (17.2 mg total) by mouth at bedtime.         Brief H and P: For complete details please refer to admission H and P, but in brief  [name] is a 69 y.o. female with Multiple Medical Problems including ESRD not on Dialysis Rx, and Sarcoidosis and DM2 who was brought to the ED by her family after a Fall a night before the admission in her home. Her family reported that she has had increased weakness and has had continued decline and poor intake of foods and  liquids over the past week. She has also been less interactive and more confused. Options were discussed and Social work was consulted and [name] and her son had agreed to placement at the [facility] for [facility]. She was referred for admission to facilitate placement at [place].  Hospital Course:  Generalized weakness with progressive decline, failure to thrive UA showed no UTI. Patient has had progressive decline in the last few months per patient's family, poor oral intake of solids and liquids, less interactive, more confused. Patient's family requested hospice at this time. Palliative medicine was consulted and patient was placed on comfort care measures per her wishes and her family's wishes. The patient's family requested [facility] residential hospice.   Essential hypertension/accelerated hypertension - Patient was placed on Coreg 12.5 mg twice a day and IV hydralazine as needed. However she is much more lethargic and not taking any oral medications at this point.  Acute on Chronic kidney disease (CKD), stage IV (severe) (HCC)/ESRD not on dialysis -Creatinine worsened to 4.36, baseline creatinine of 3 -3.8. Patient was placed on IV fluid hydration during this admission however no significant improvement in the renal function.   Acute metabolic Encephalopathy Likely superimposed on dementia with failure to thrive, moderate malnutrition - Palliative medicine consulted and currently comfort care   Chronic systolic and diastolic congestive heart failure ([place]) - Patient was hypovolemic and with a degree of dehydration at the time of admission, was placed on IV fluid hydration. - 2-D echo 6/16 had shown EF of 99991111, grade 1 diastolic dysfunction   COLD (chronic obstructive lung disease) (HCC) - Currently stable, no wheezing   Type 2 diabetes mellitus with complication (HCC) - Continue sliding scale insulin   Fall: Patient is currently comfort care  goals   Abdominal pain, has a history of chronic abdominal pain: Possibly due to low-flow mesenteric ischemia - KUB negative for any acute SBO - MRA in 8/16 had shown no significant mesenteric vascular obstruction, EGD in 8/16 was unremarkable. Colonoscopy in 8/16 only showed polyps.  Left ventricular apical thrombus/CAD, pulmonary hypertension -Coumadin now discontinued due to comfort care goals  Day of Discharge BP 137/64 mmHg  Pulse 84  Temp(Src) 98.8 F (37.1 C) (Axillary)  Resp 18  Wt 66.497 kg (146 lb 9.6 oz)  SpO2 98%  Physical Exam: General: Somnolent, comfortable not in any acute distress. HEENT: anicteric sclera, pupils reactive to light  and accommodation CVS: S1-S2 clear no murmur rubs or gallops Chest: clear to auscultation bilaterally, no wheezing rales or rhonchi Abdomen: soft nontender, nondistended, normal bowel sounds Extremities: no cyanosis, clubbing or edema noted bilaterally    The results of significant diagnostics from this hospitalization (including imaging, microbiology, ancillary and laboratory) are listed below for reference.    LAB RESULTS: Basic Metabolic Panel:  Recent Labs Lab 06/14/15 0549 06/15/15 0210  NA 140 141  K 3.6 3.4*  CL 101 107  CO2 26 24  GLUCOSE 208* 114*  BUN 27* 35*  CREATININE 2.90* 4.36*  CALCIUM 11.5* 10.1   Liver Function Tests: No results for input(s): AST, ALT, ALKPHOS, BILITOT, PROT, ALBUMIN in the last 168 hours. No results for input(s): LIPASE, AMYLASE in the last 168 hours. No results for input(s): AMMONIA in the last 168 hours. CBC:  Recent Labs Lab 06/13/15 1630 06/14/15 0549 06/15/15 0210  WBC 14.2* 15.7* 10.5  NEUTROABS 11.3*  --   --   HGB 18.4* 17.3* 14.1  HCT 53.5* 51.1* 44.1  MCV 89.8 92.1 94.4  PLT 398 426* 377   Cardiac Enzymes:  Recent Labs Lab 06/13/15 1630  CKTOTAL 61   BNP: Invalid input(s): POCBNP CBG:  Recent Labs Lab 06/15/15 0354 06/15/15 0711  GLUCAP 113* 119*     Significant Diagnostic Studies:  Dg Chest 2 View  06/13/2015  CLINICAL DATA:  Patient was found this morning after falling last night at 2100. Family found her this morning Alert and Oriented per baseline. Patient is a hospice patient for renal failure. Reported patient had one episode of vomiting. EXAM: CHEST  2 VIEW COMPARISON:  04/24/2015 FINDINGS: The heart is enlarged. Shallow lung inflation. There are no focal consolidations or pleural effusions. No pulmonary edema. There are small bilateral pleural effusions. Surgical clips are seen in the upper abdomen. IMPRESSION: 1. Cardiomegaly without pulmonary edema. 2. Small bilateral effusions. Electronically Signed   By: Nolon Nations M.D.   On: 06/13/2015 14:26   Dg Pelvis 1-2 Views  06/13/2015  CLINICAL DATA:  [place] last night.  Pelvic pain. EXAM: PELVIS - 1-2 VIEW COMPARISON:  CT scan 03/09/2015. FINDINGS: Both hips are normally located. No acute hip fracture or plain film evidence of avascular necrosis. The pubic symphysis and SI joints are intact. No pelvic fractures or bone lesions. Extensive vascular calcifications are noted. IMPRESSION: No acute bony findings. Electronically Signed   By: Marijo Sanes M.D.   On: 06/13/2015 14:26   Ct Head Wo Contrast  06/13/2015  CLINICAL DATA:  Recent fall last evening with headaches and neck pain EXAM: CT HEAD WITHOUT CONTRAST CT CERVICAL SPINE WITHOUT CONTRAST TECHNIQUE: Multidetector CT imaging of the head and cervical spine was performed following the standard protocol without intravenous contrast. Multiplanar CT image reconstructions of the cervical spine were also generated. COMPARISON:  04/24/2015 FINDINGS: CT HEAD FINDINGS The bony calvarium is intact. No gross soft tissue abnormality is noted. Mild atrophic changes are seen. No findings to suggest acute hemorrhage, acute infarction or space-occupying mass lesion are noted. CT CERVICAL SPINE FINDINGS Seven cervical segments are well visualized.  Vertebral body height is well maintained osteophytic changes are noted from C4-C7 with mild degrees of neural foraminal narrowing. Facet hypertrophic changes are seen without evidence of acute fracture. There is a small defect in the right lateral aspect of the C1 ring best seen on image number 21. This is chronic in nature and is been shown on multiple previous exams dating back to  2013. No gross soft tissue abnormality is noted aside from a multinodular goiter. The dominant nodule measures 14 mm in greatest dimension. Visualized lung apices are within normal limits. IMPRESSION: CT of the head: Mild atrophic changes without acute abnormality. CT of the cervical spine: Degenerative change without acute abnormality. Multinodular goiter. Dominant right-sided nodule measures 14 mm. Findings do not meet current SRU consensus criteria for biopsy. Follow-up by clinical exam is recommended. If patient has known risk factors for thyroid carcinoma, consider follow-up ultrasound in 12 months. If patient is clinically hyperthyroid, consider nuclear medicine thyroid uptake and scan. Reference: Management of Thyroid Nodules Detected at [place]: Society of Radiologists in [place]. Radiology 2005; [id number]. Electronically Signed   By: Inez Catalina M.D.   On: 06/13/2015 14:21   Ct Cervical Spine Wo Contrast  06/13/2015  CLINICAL DATA:  Recent fall last evening with headaches and neck pain EXAM: CT HEAD WITHOUT CONTRAST CT CERVICAL SPINE WITHOUT CONTRAST TECHNIQUE: Multidetector CT imaging of the head and cervical spine was performed following the standard protocol without intravenous contrast. Multiplanar CT image reconstructions of the cervical spine were also generated. COMPARISON:  04/24/2015 FINDINGS: CT HEAD FINDINGS The bony calvarium is intact. No gross soft tissue abnormality is noted. Mild atrophic changes are seen. No findings to suggest acute hemorrhage, acute infarction or  space-occupying mass lesion are noted. CT CERVICAL SPINE FINDINGS Seven cervical segments are well visualized. Vertebral body height is well maintained osteophytic changes are noted from C4-C7 with mild degrees of neural foraminal narrowing. Facet hypertrophic changes are seen without evidence of acute fracture. There is a small defect in the right lateral aspect of the C1 ring best seen on image number 21. This is chronic in nature and is been shown on multiple previous exams dating back to 2013. No gross soft tissue abnormality is noted aside from a multinodular goiter. The dominant nodule measures 14 mm in greatest dimension. Visualized lung apices are within normal limits. IMPRESSION: CT of the head: Mild atrophic changes without acute abnormality. CT of the cervical spine: Degenerative change without acute abnormality. Multinodular goiter. Dominant right-sided nodule measures 14 mm. Findings do not meet current SRU consensus criteria for biopsy. Follow-up by clinical exam is recommended. If patient has known risk factors for thyroid carcinoma, consider follow-up ultrasound in 12 months. If patient is clinically hyperthyroid, consider nuclear medicine thyroid uptake and scan. Reference: Management of Thyroid Nodules Detected at [place]: Society of Radiologists in [place]. Radiology 2005; [id number]. Electronically Signed   By: Inez Catalina M.D.   On: 06/13/2015 14:21   Dg Abd 2 Views  06/14/2015  CLINICAL DATA:  Abdominal pain EXAM: ABDOMEN - 2 VIEW COMPARISON:  CT abdomen pelvis dated 03/09/2015 FINDINGS: Mildly prominent but nondilated loops of small bowel in the lower abdomen. No evidence of free air on the lateral decubitus view. Cholecystectomy clips. Additional surgical clips in the left upper abdomen. Mild degenerative changes the lower lumbar spine. IMPRESSION: No evidence of small bowel obstruction or free air. Electronically Signed   By: Julian Hy M.D.   On:  06/14/2015 09:45    2D ECHO:   Disposition and Follow-up: Discharge Instructions    Discharge instructions    Complete by:  As directed   Comfort food     Increase activity slowly    Complete by:  As directed             DISPOSITION: [place]  Follow-up Information    Follow up with FULP, CAMMIE, MD. Schedule an appointment as soon as possible for a visit in 2 weeks.   Specialty:  Family Medicine   Why:  As needed, If symptoms worsen   Contact information:   [address] [address] [phone]        Time spent on Discharge: 25 minutes  Signed:   Paxtyn Wisdom M.D. [facility] 06/15/2015, 10:25 AM Pager: [phone]

## 2015-06-15 NOTE — Progress Notes (Addendum)
[name] 6N-10-[facility] Visit-Lisa Strandberg RN  This is a current [facility] patient with diagnosis of heart disease. She has an out of facility DNR. Patient seen in room with sister, [name], her  brother and two other family members at the bedside. Pt. Very difficult to arouse this morning. She occasionally wiil open her eyes to her name but otherwise has been sleeping most of the last 24 hours. She has been incontinent of urine 1x in the last 24 hours and has not voided any more. Pt. Has had a few sips of water yesterday and 2 spoonfuls of grits. She has been too lethargic to take several of her PO meds. Pt. Was given 1 mg of  Dilaudid IV and 4 mg of  IV Zofran yesterday. Family asking about Interior and spatial designer explained services provided and answered questions. Preliminary paper work completed. Patient will transfer to [facility] today. Please call with any questions.  [facility] Liaison [phone]  Addendum: D/C summary faxed to [facility] and staff  RN to call report to [phone]. Jarrett Soho, hospital CSW to call [facility] for transport.

## 2015-06-15 NOTE — Progress Notes (Addendum)
Update:  Spoke with Lattie Haw with regards to transport and all paperwork completed for Hospice RN aware of plan and will call report. All paperwork in chart for EMS transport to [facility]. No other needs voiced. Family in room, aware of plan, and working actively with hospice. HN, 06/15/2015 1:10 PM   LCSW following patient as consult placed for residential hospice assistance/disposition.  LCSW spoke with hospice liaison for [facility] and patient is planned for admission today once all paperwork has been completed.  LCSW will arrange for transport by EMS once Lattie Haw and hospice team have finalized all paperwork.  Lane Hacker, MSW Clinical Social Work: Emergency Room [phone]

## 2015-06-17 ENCOUNTER — Ambulatory Visit: Payer: Self-pay | Admitting: Interventional Cardiology

## 2015-06-17 DIAGNOSIS — Z5181 Encounter for therapeutic drug level monitoring: Secondary | ICD-10-CM

## 2015-06-17 DIAGNOSIS — I513 Intracardiac thrombosis, not elsewhere classified: Secondary | ICD-10-CM

## 2015-06-17 DIAGNOSIS — I214 Non-ST elevation (NSTEMI) myocardial infarction: Secondary | ICD-10-CM

## 2015-07-03 DEATH — deceased

## 2015-10-10 IMAGING — DX DG CHEST 2V
2 series · 2 of 2 positions shown · non-contrast
Comparison: CT 02/14/2015

CLINICAL DATA: Short of breath and weakness

EXAM:
CHEST  2 VIEW

[chest pa]
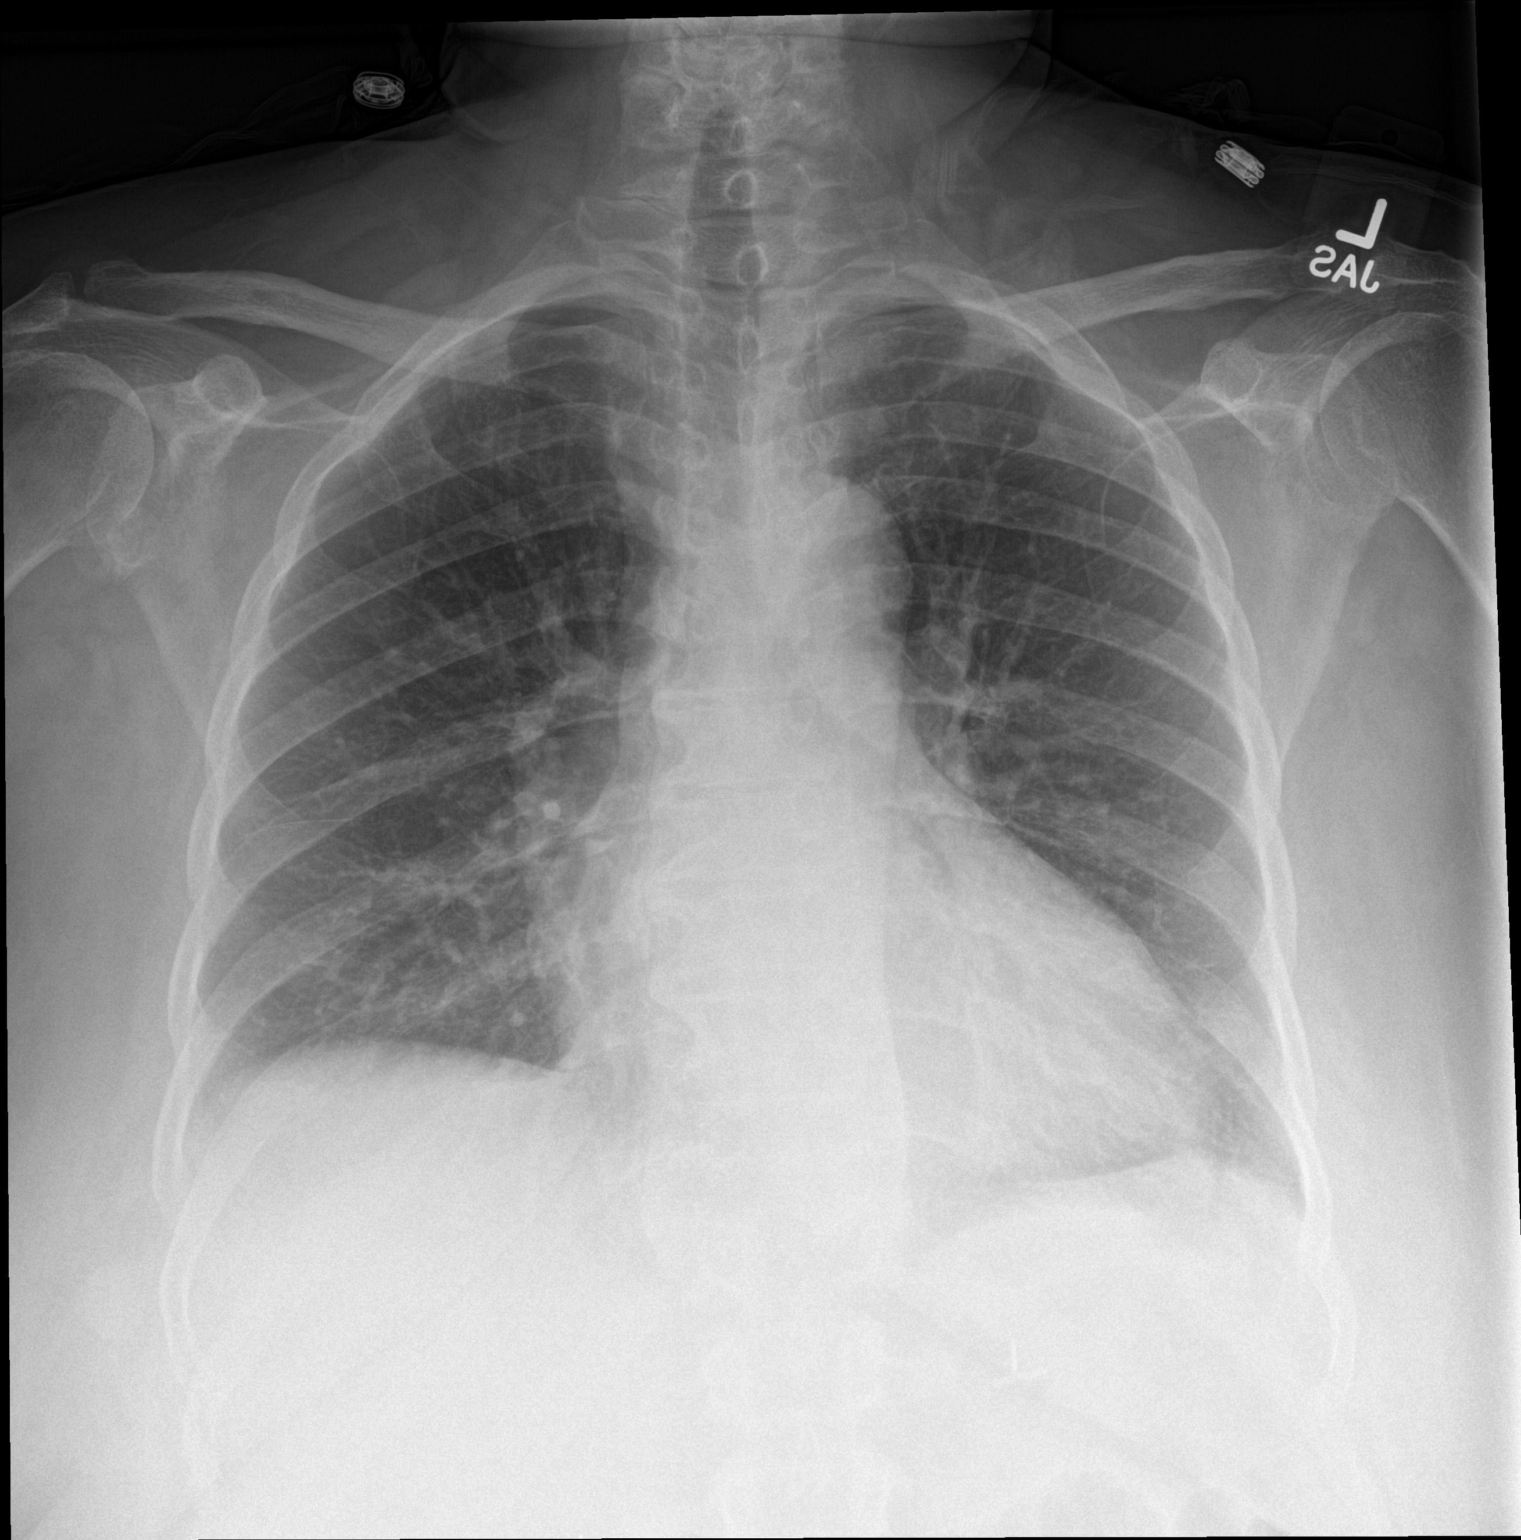

[chest lat]
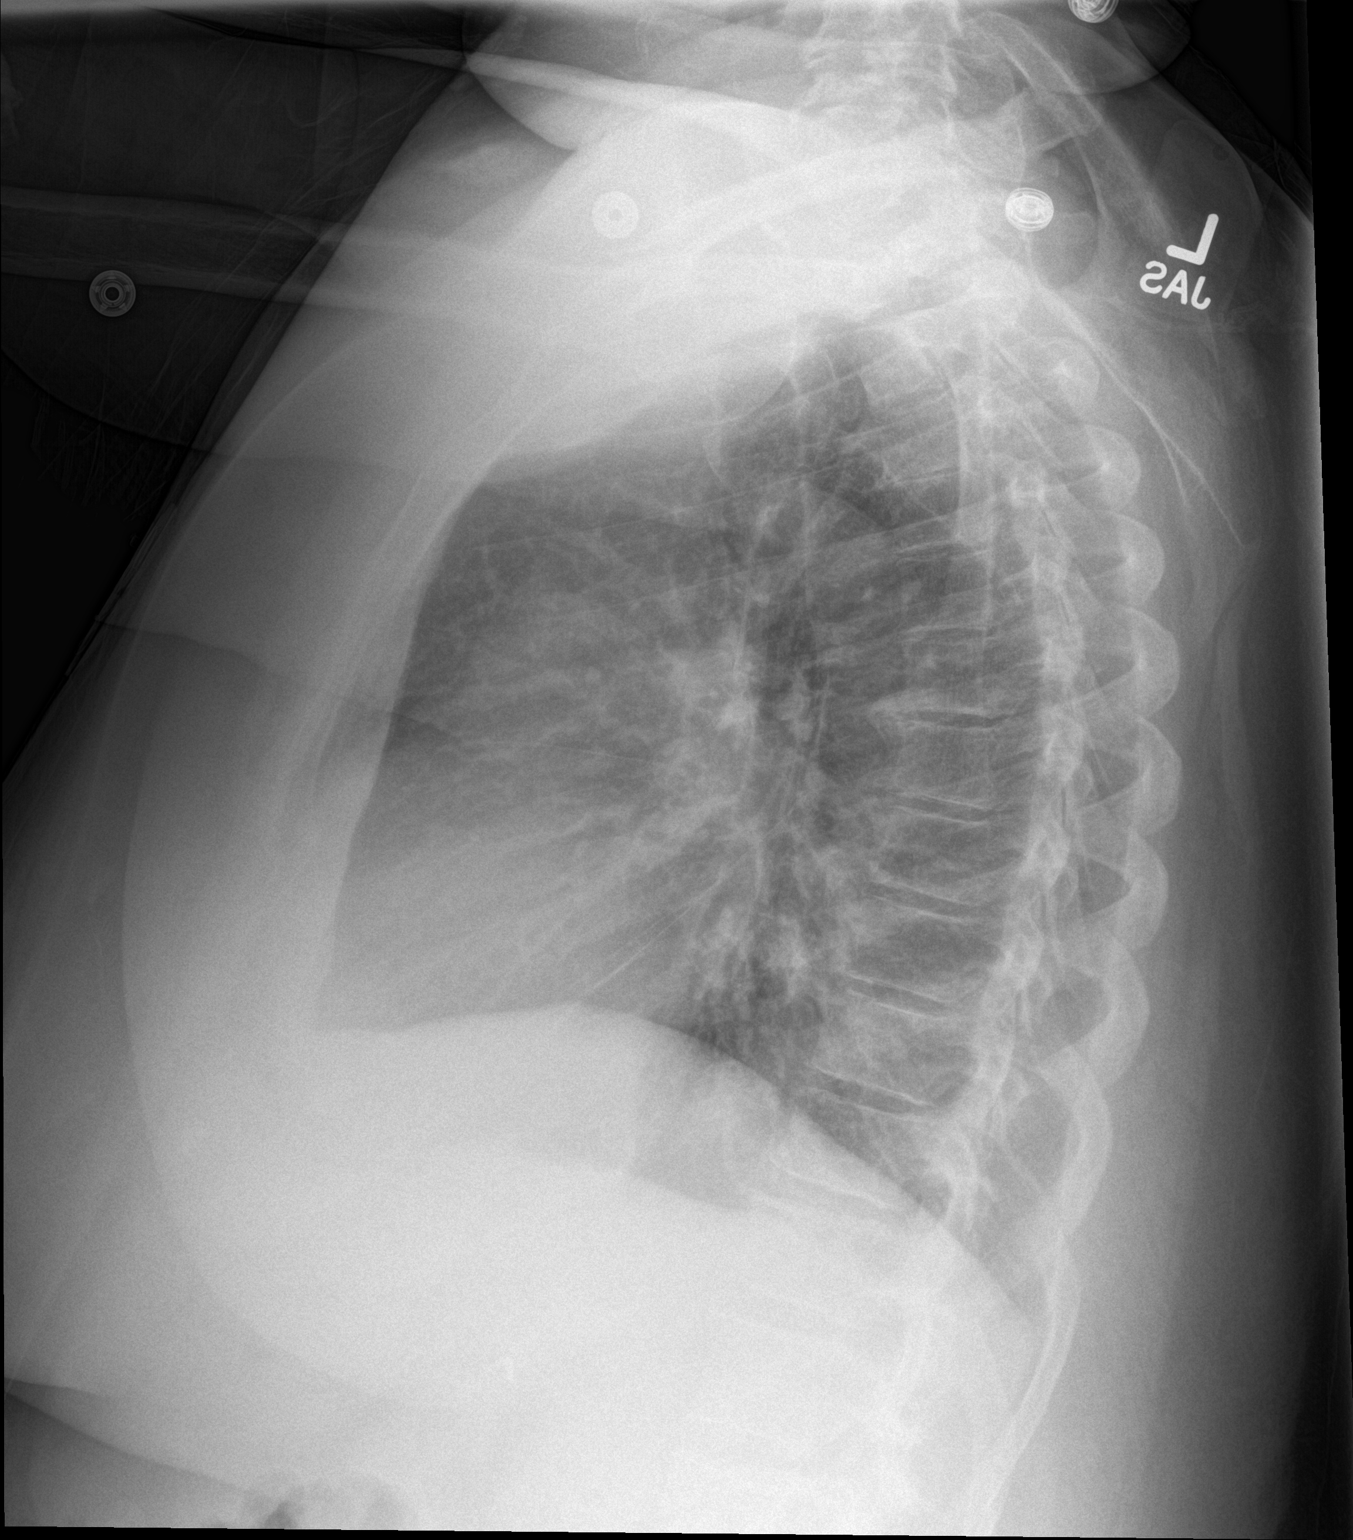

[2 of 2 positions shown; findings below may reference images not displayed]

FINDINGS: Normal mediastinum and cardiac silhouette. Chronic central
bronchitic markings. Normal pulmonary vasculature. No effusion,
infiltrate, or pneumothorax.
IMPRESSION: Mild bronchitic markings.  No acute findings.

## 2016-01-27 IMAGING — CT CT HEAD W/O CM
4 of 6 series · 15 of 47 positions shown, 16 images · non-contrast
Comparison: 04/24/2015

CLINICAL DATA: Recent fall last evening with headaches and neck
pain

EXAM:
CT HEAD WITHOUT CONTRAST
CT CERVICAL SPINE WITHOUT CONTRAST
TECHNIQUE: Multidetector CT imaging of the head and cervical spine was
performed following the standard protocol without intravenous
contrast. Multiplanar CT image reconstructions of the cervical spine
were also generated.

[Series 3: head without · axial · non-contrast · 0.41mm/px · z∈[-114,+26]mm · 3 of 29 slices shown, 4 images]
[im 1/29  brain]
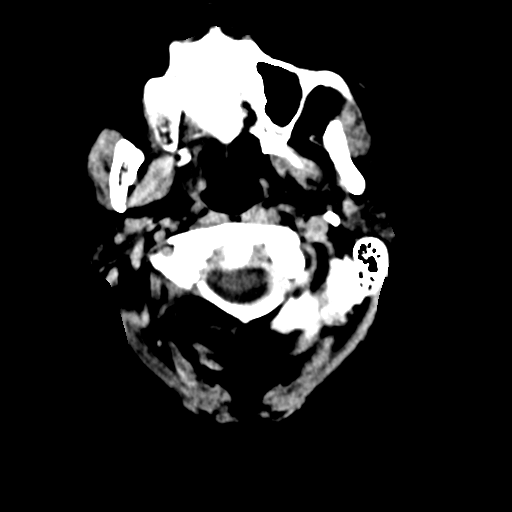
[im 1/29  bone]
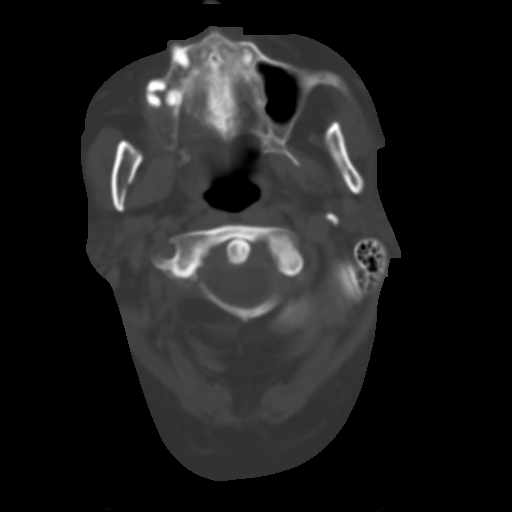
[im 15/29  brain]
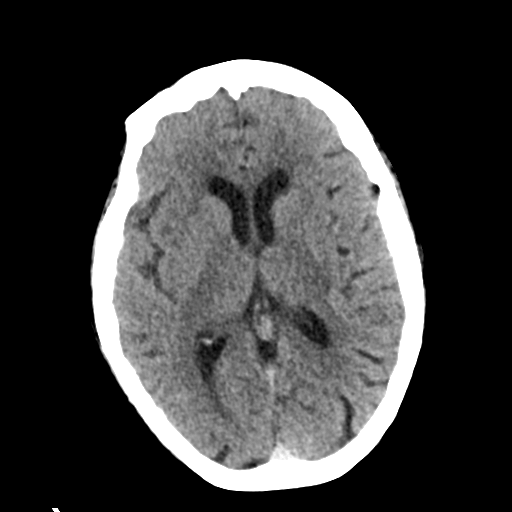
[im 29/29  brain]
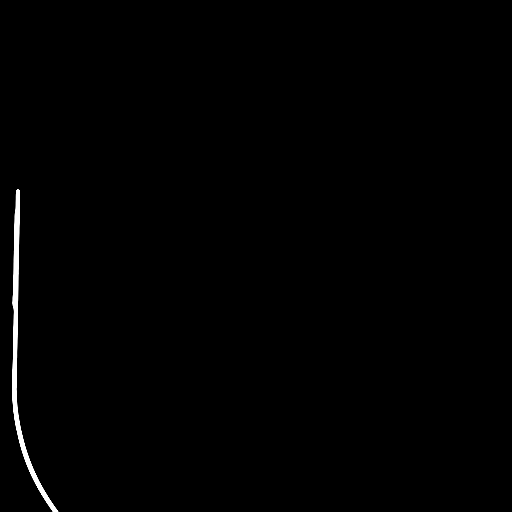

[Series 4: head bone · axial · 0.41mm/px · z∈[-94,+6]mm · 6 of 72 slices shown]
[im 11/72  bone]
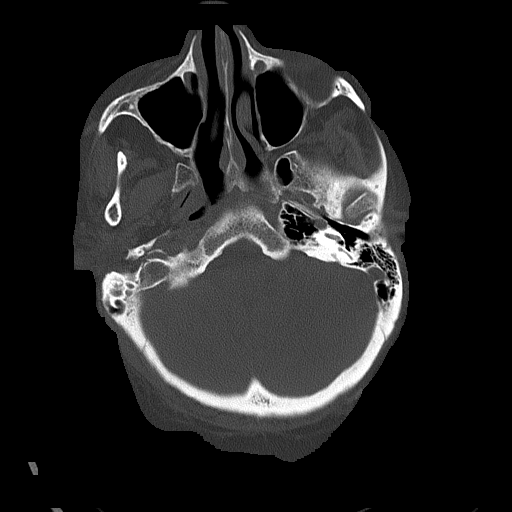
[im 21/72  bone]
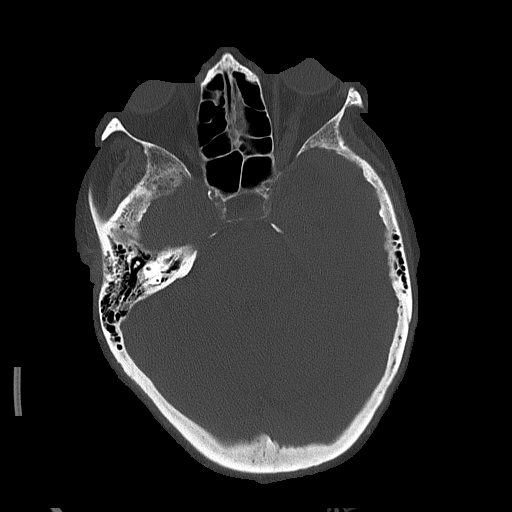
[im 31/72  bone]
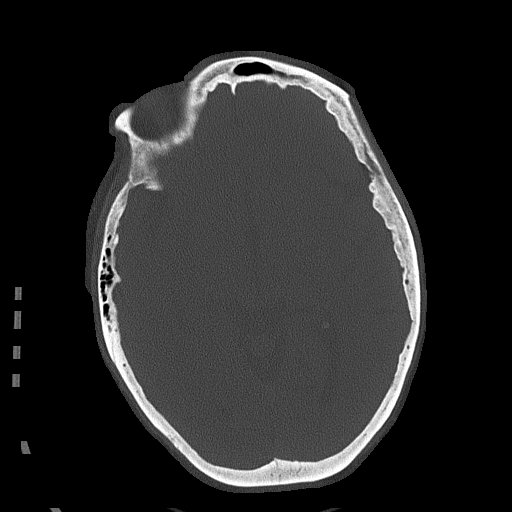
[im 41/72  bone]
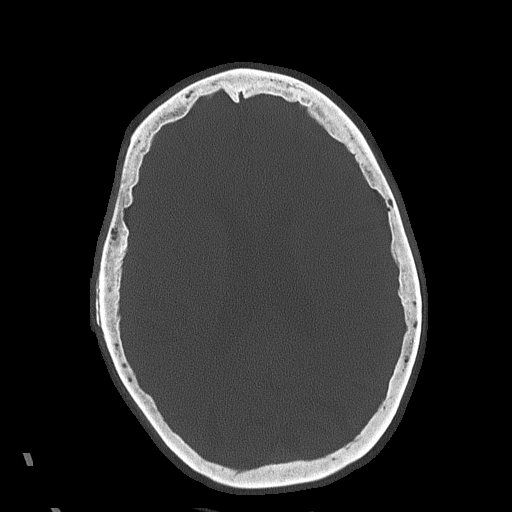
[im 51/72  bone]
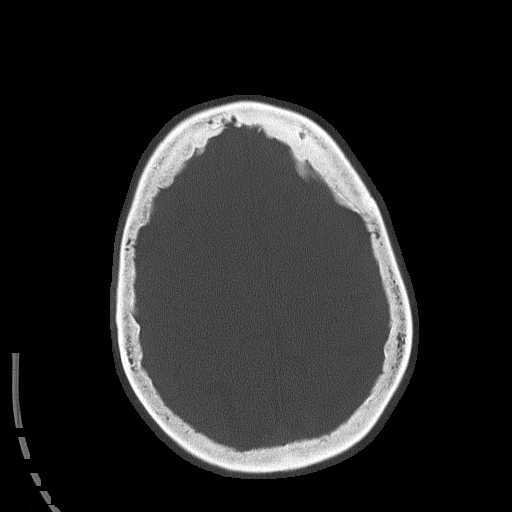
[im 61/72  bone]
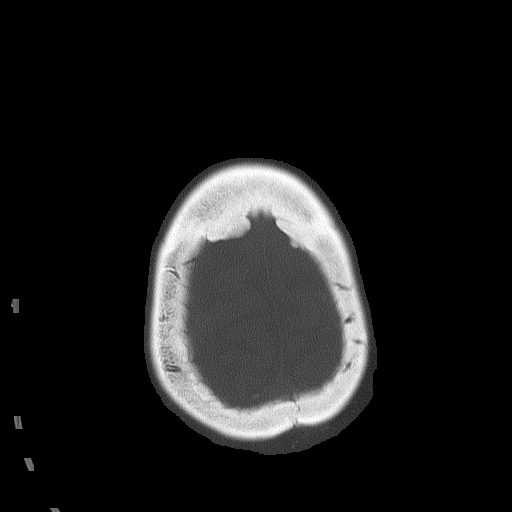

[Series 7: c_spine 2.0 sag bone · sagittal · 0.32mm/px · 3 of 55 slices shown]
[im 19/55  brain]
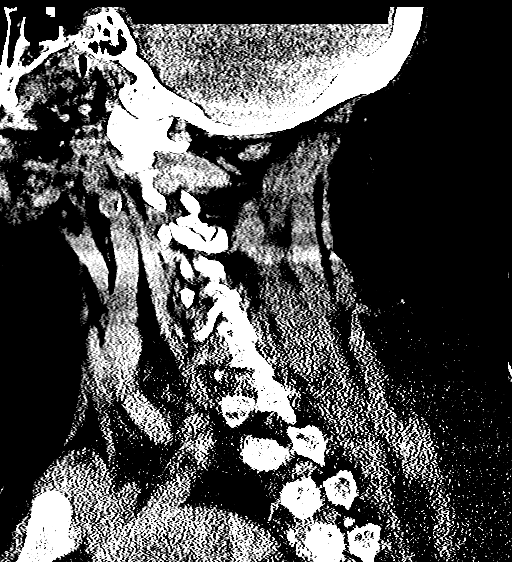
[im 28/55  brain]
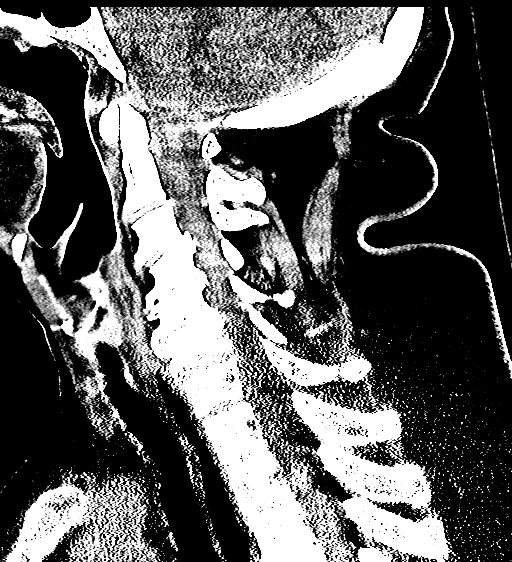
[im 37/55  brain]
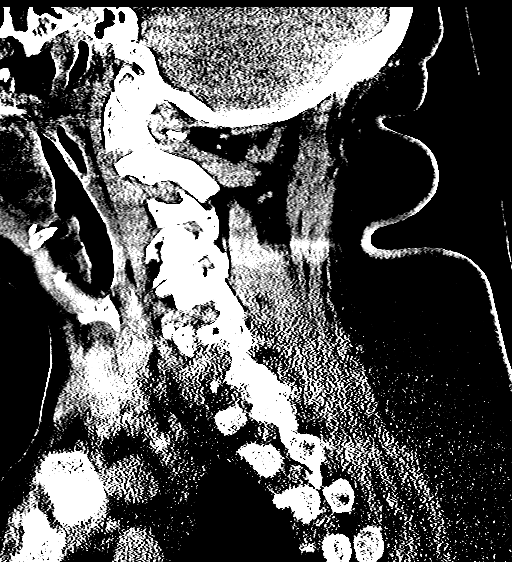

[Series 8: c_spine 2.0 cor bone · coronal · 0.26mm/px · 3 of 67 slices shown]
[im 23/67  brain]
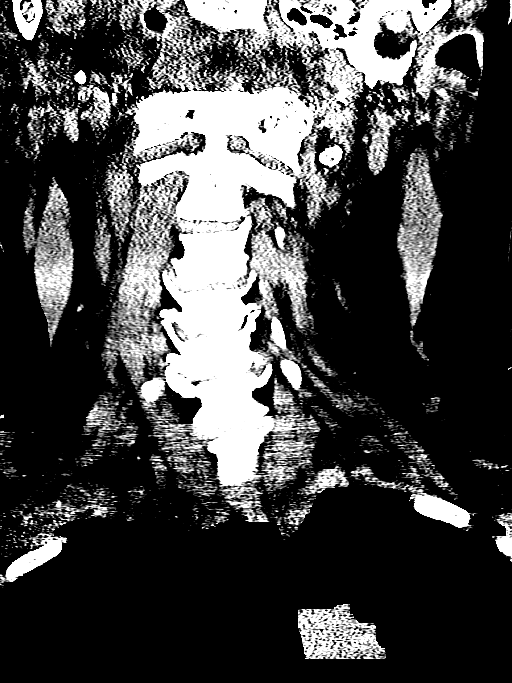
[im 30/67  brain]
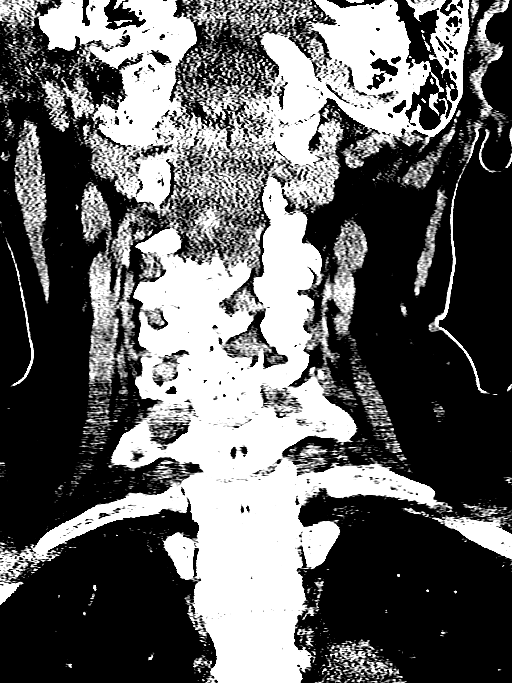
[im 37/67  brain]
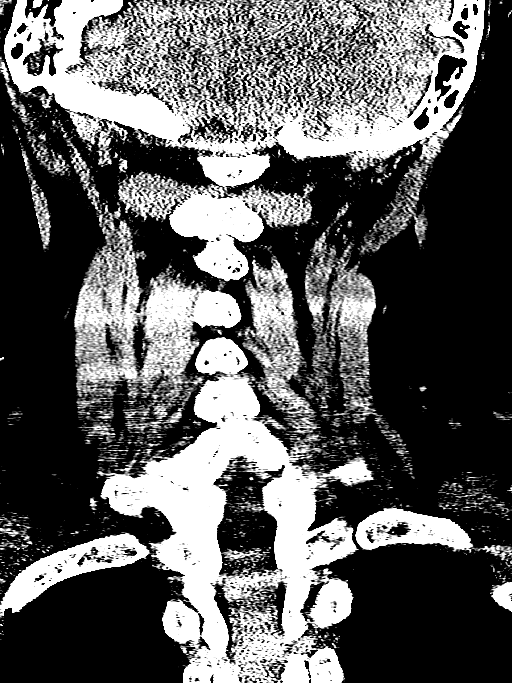

[15 of 47 positions shown; findings below may reference images not displayed]

FINDINGS: CT HEAD FINDINGS

The bony calvarium is intact. No gross soft tissue abnormality is
noted. Mild atrophic changes are seen. No findings to suggest acute
hemorrhage, acute infarction or space-occupying mass lesion are
noted.

CT CERVICAL SPINE FINDINGS

Seven cervical segments are well visualized. Vertebral body height
is well maintained osteophytic changes are noted from C4-C7 with
mild degrees of neural foraminal narrowing. Facet hypertrophic
changes are seen without evidence of acute fracture. There is a
small defect in the right lateral aspect of the C1 ring best seen on
image number 21. This is chronic in nature and is been shown on
multiple previous exams dating back to 0086. No gross soft tissue
abnormality is noted aside from a multinodular goiter. The dominant
nodule measures 14 mm in greatest dimension. Visualized lung apices
are within normal limits.
IMPRESSION: CT of the head: Mild atrophic changes without acute abnormality.

CT of the cervical spine: Degenerative change without acute
abnormality.

Multinodular goiter. Dominant right-sided nodule measures 14 mm.
Findings do not meet current SRU consensus criteria for biopsy.
Follow-up by clinical exam is recommended. If patient has known risk
factors for thyroid carcinoma, consider follow-up ultrasound in 12
months. If patient is clinically hyperthyroid, consider nuclear
medicine thyroid uptake and scan.

Reference: Management of Thyroid Nodules Detected at [place]: Society of
Radiologists in Ultrasound Consensus Conference Statement. Radiology

## 2016-09-18 IMAGING — US US RENAL
1 series · 14 of 25 positions shown · non-contrast
Comparison: CT abdomen and pelvis 01/20/2015

CLINICAL DATA: Acute renal failure, history essential benign
hypertension, coronary artery disease, COPD, type II diabetes
mellitus, sarcoidosis, stage IV chronic kidney disease

EXAM:
RENAL / URINARY TRACT ULTRASOUND COMPLETE

[Series 1: us renal · 0.22mm/px · 14 of 27 slices shown]
[im 1/27]
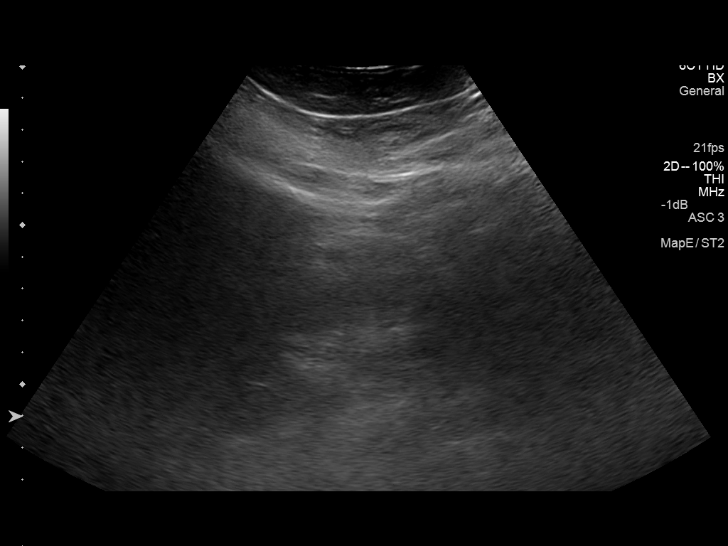
[im 3/27]
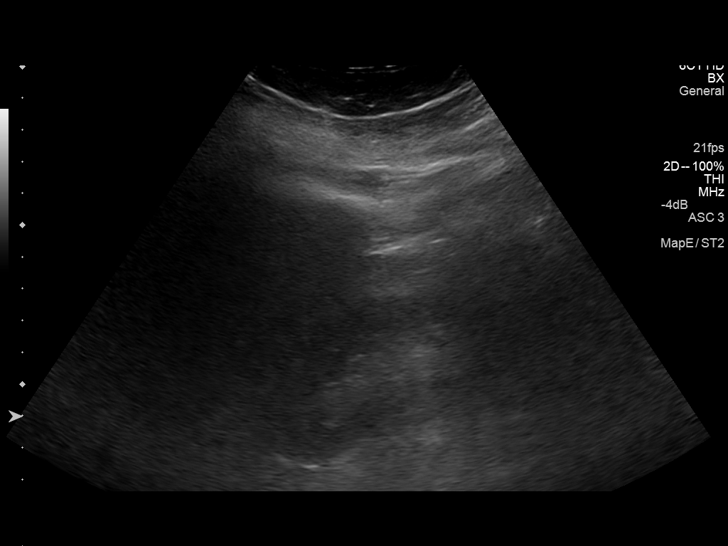
[im 5/27]
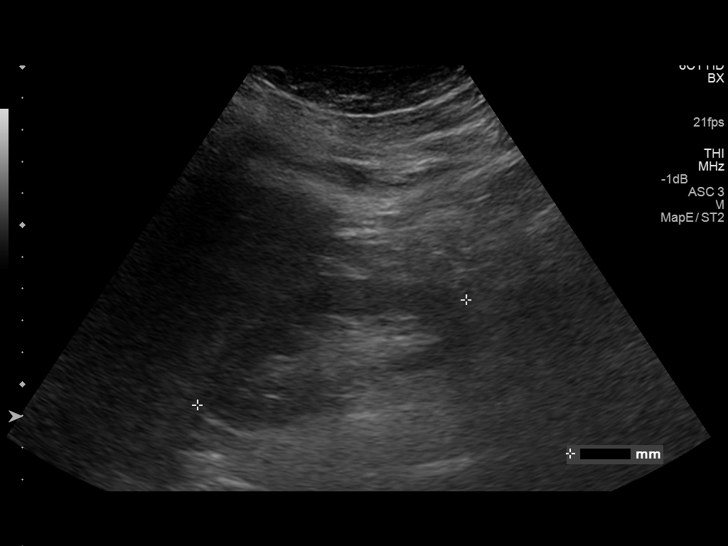
[im 7/27]
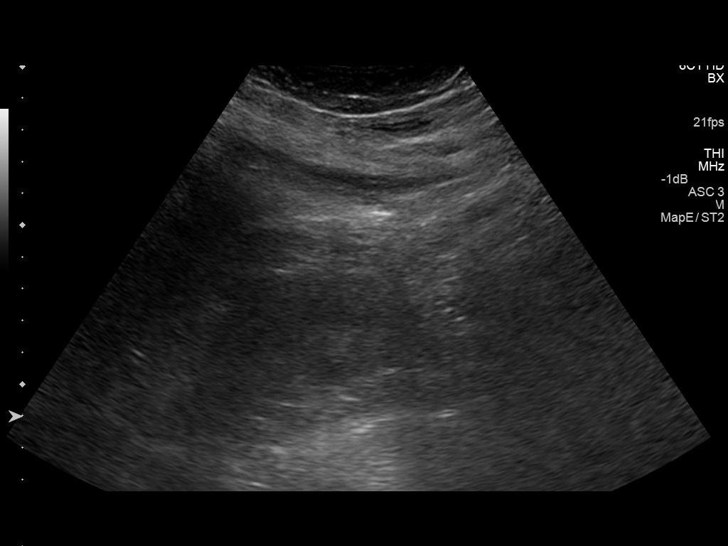
[im 9/27]
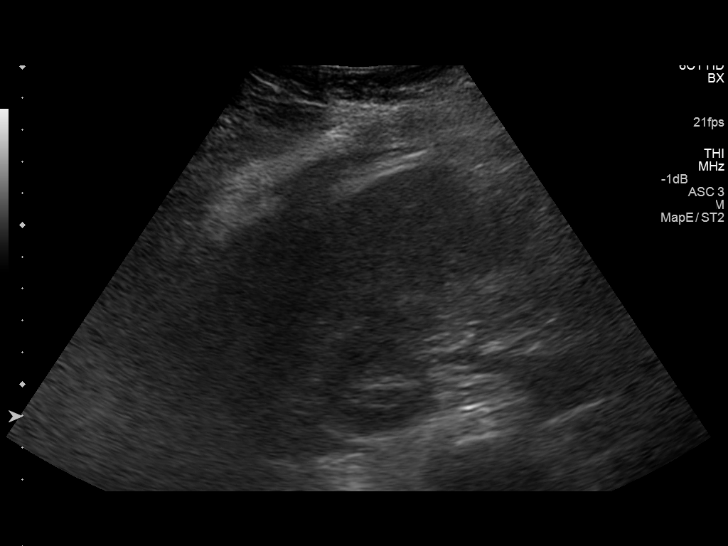
[im 10/27]
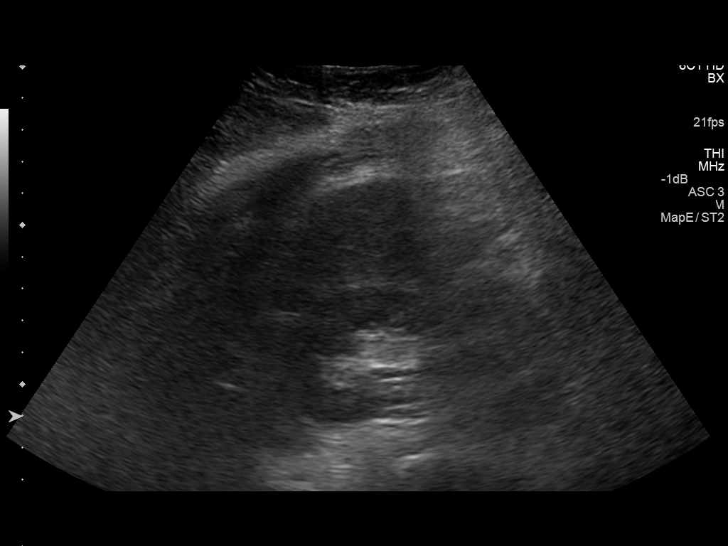
[im 12/27]
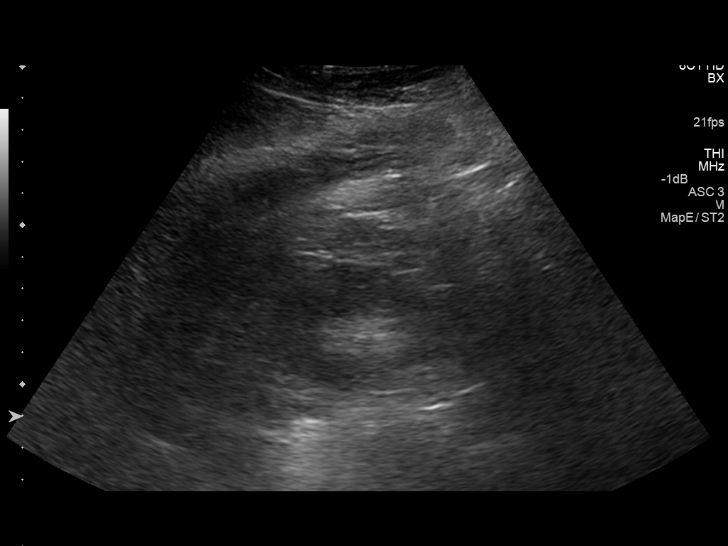
[im 15/27]
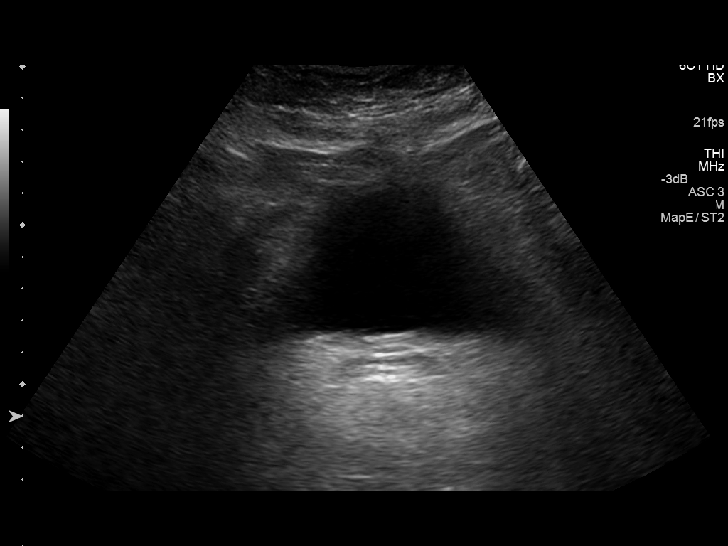
[im 17/27]
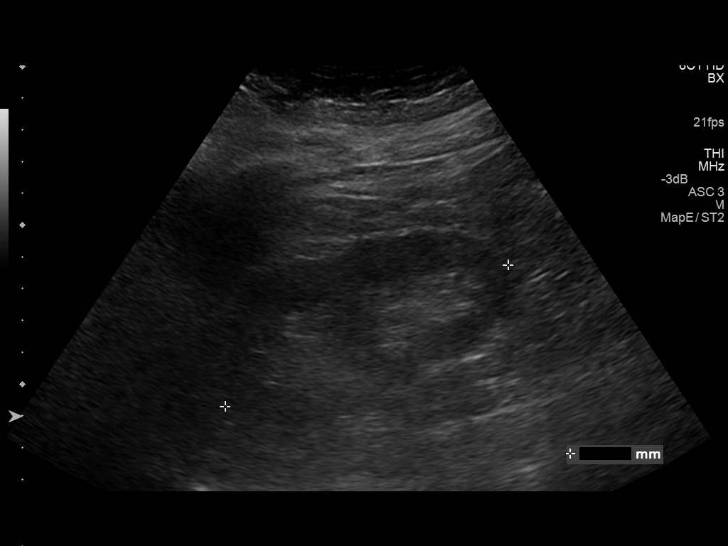
[im 18/27]
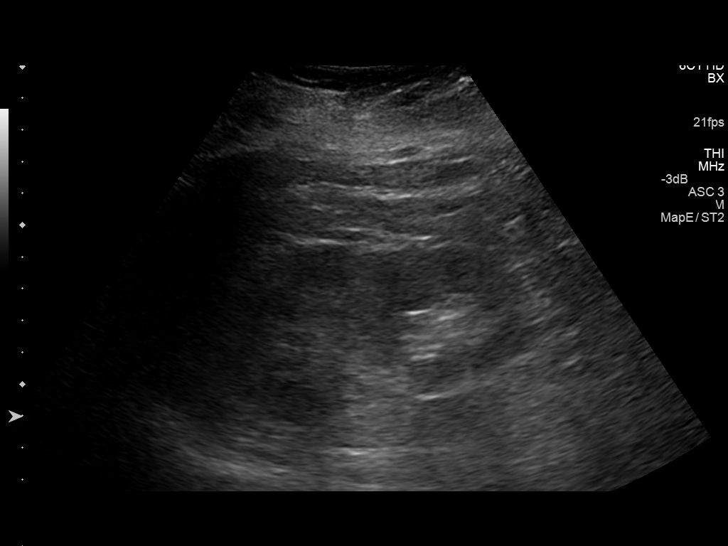
[im 20/27]
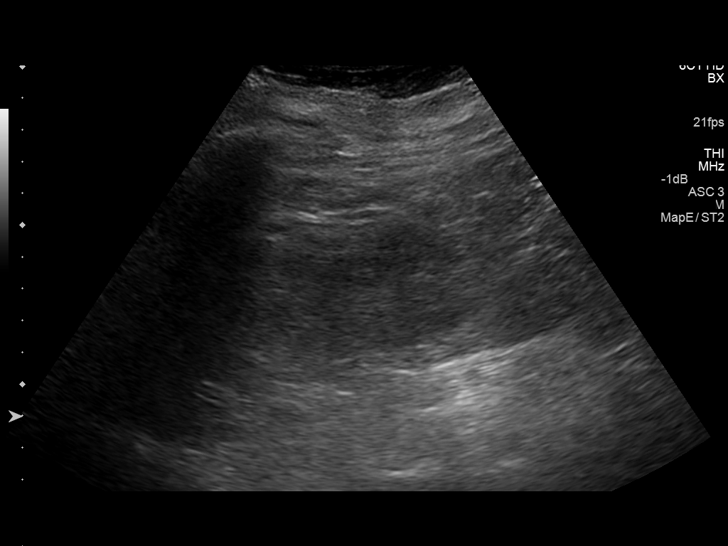
[im 22/27]
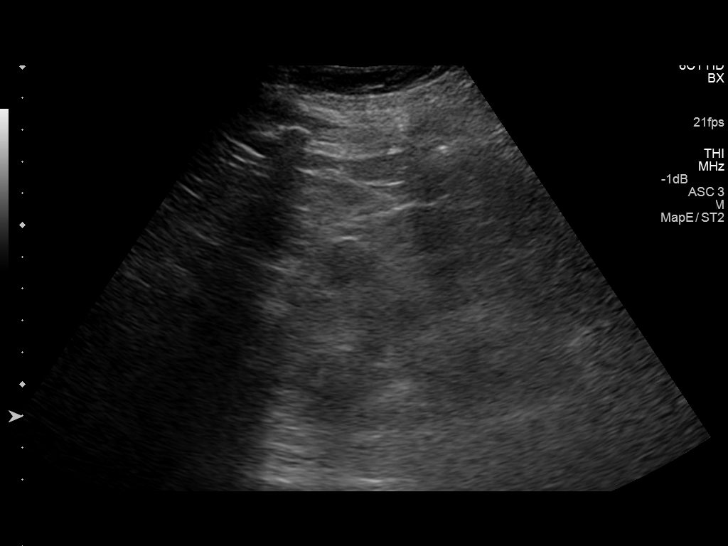
[im 24/27]
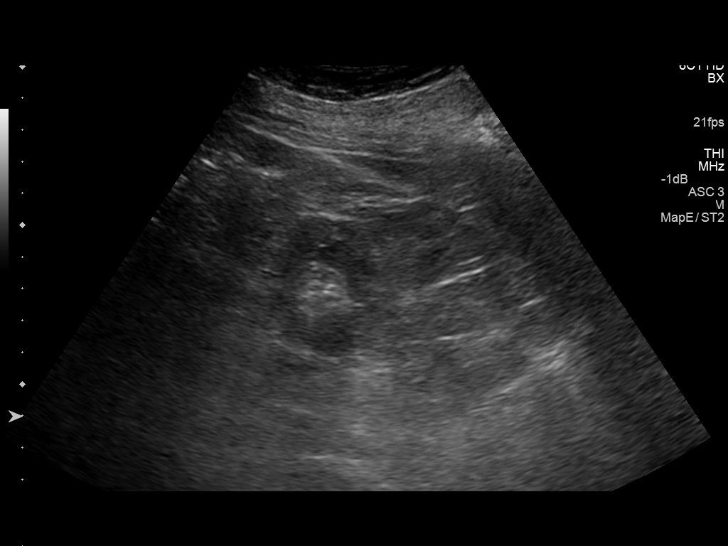
[im 27/27]
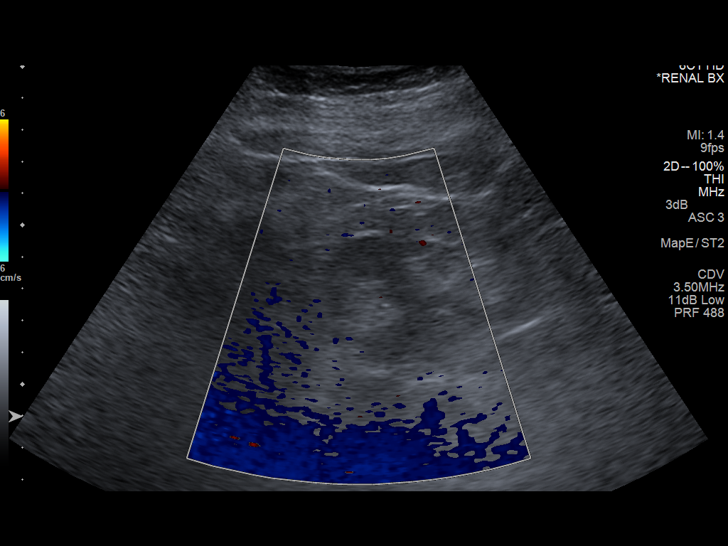

[14 of 25 positions shown; findings below may reference images not displayed]

FINDINGS: Degradation of image quality secondary to body habitus.

Right Kidney:

Length: 9.2 cm. Grossly normal cortical thickness and echogenicity.
No definite mass, hydronephrosis or shadowing calcification.

Left Kidney:

Length: 10.0 cm. Grossly normal cortical thickness and echogenicity.
No definite mass, hydronephrosis or shadowing calcification.

Bladder:

Partially distended, grossly unremarkable.
IMPRESSION: No acute abnormalities.

## 2020-08-06 NOTE — Telephone Encounter (Signed)
Closing open encounter
